# Patient Record
Sex: Female | Born: 1960 | Race: Black or African American | Hispanic: No | Smoking: Former smoker
Health system: Southern US, Community
[De-identification: ages and names within clinical notes are randomized; demographics above are authoritative.]

## PROBLEM LIST (undated history)

## (undated) ENCOUNTER — Inpatient Hospital Stay: Payer: Self-pay | Admitting: Physician Assistant

## (undated) ENCOUNTER — Emergency Department (HOSPITAL_COMMUNITY): Admission: EM | Disposition: A | Discharge status: Home / Self Care | Payer: Medicaid Other

## (undated) DIAGNOSIS — C50919 Malignant neoplasm of unspecified site of unspecified female breast: Secondary | ICD-10-CM

## (undated) DIAGNOSIS — R351 Nocturia: Secondary | ICD-10-CM

## (undated) DIAGNOSIS — R569 Unspecified convulsions: Secondary | ICD-10-CM

## (undated) DIAGNOSIS — F329 Major depressive disorder, single episode, unspecified: Secondary | ICD-10-CM

## (undated) DIAGNOSIS — F4310 Post-traumatic stress disorder, unspecified: Secondary | ICD-10-CM

## (undated) DIAGNOSIS — IMO0001 Reserved for inherently not codable concepts without codable children: Secondary | ICD-10-CM

## (undated) DIAGNOSIS — F319 Bipolar disorder, unspecified: Secondary | ICD-10-CM

## (undated) DIAGNOSIS — M1990 Unspecified osteoarthritis, unspecified site: Secondary | ICD-10-CM

## (undated) DIAGNOSIS — F419 Anxiety disorder, unspecified: Secondary | ICD-10-CM

## (undated) DIAGNOSIS — F209 Schizophrenia, unspecified: Secondary | ICD-10-CM

## (undated) DIAGNOSIS — R443 Hallucinations, unspecified: Secondary | ICD-10-CM

## (undated) DIAGNOSIS — D649 Anemia, unspecified: Secondary | ICD-10-CM

## (undated) DIAGNOSIS — E039 Hypothyroidism, unspecified: Secondary | ICD-10-CM

## (undated) DIAGNOSIS — F1410 Cocaine abuse, uncomplicated: Secondary | ICD-10-CM

## (undated) DIAGNOSIS — G4730 Sleep apnea, unspecified: Secondary | ICD-10-CM

## (undated) DIAGNOSIS — F32A Depression, unspecified: Secondary | ICD-10-CM

## (undated) DIAGNOSIS — C50911 Malignant neoplasm of unspecified site of right female breast: Secondary | ICD-10-CM

## (undated) DIAGNOSIS — I10 Essential (primary) hypertension: Secondary | ICD-10-CM

## (undated) DIAGNOSIS — J449 Chronic obstructive pulmonary disease, unspecified: Secondary | ICD-10-CM

## (undated) HISTORY — PX: INGUINAL HERNIA REPAIR: SUR1180

## (undated) HISTORY — DX: Malignant neoplasm of unspecified site of unspecified female breast: C50.919

## (undated) HISTORY — PX: TONSILLECTOMY: SUR1361

## (undated) HISTORY — PX: FRACTURE SURGERY: SHX138

## (undated) HISTORY — PX: OVARIAN CYST SURGERY: SHX726

## (undated) HISTORY — DX: Unspecified convulsions: R56.9

---

## 1991-10-08 HISTORY — PX: PATELLA FRACTURE SURGERY: SHX735

## 1999-07-05 ENCOUNTER — Encounter: Payer: Self-pay | Admitting: Obstetrics & Gynecology

## 1999-07-05 ENCOUNTER — Inpatient Hospital Stay (HOSPITAL_COMMUNITY): Admission: AD | Admit: 1999-07-05 | Discharge: 1999-07-18 | Payer: Self-pay | Admitting: Obstetrics & Gynecology

## 1999-07-10 ENCOUNTER — Encounter: Payer: Self-pay | Admitting: Obstetrics & Gynecology

## 1999-07-11 ENCOUNTER — Encounter: Payer: Self-pay | Admitting: Obstetrics & Gynecology

## 2000-05-01 ENCOUNTER — Encounter: Payer: Self-pay | Admitting: Specialist

## 2000-05-01 ENCOUNTER — Inpatient Hospital Stay (HOSPITAL_COMMUNITY): Admission: EM | Admit: 2000-05-01 | Discharge: 2000-05-12 | Payer: Self-pay | Admitting: Emergency Medicine

## 2000-05-01 ENCOUNTER — Encounter: Payer: Self-pay | Admitting: Emergency Medicine

## 2000-05-04 ENCOUNTER — Encounter: Payer: Self-pay | Admitting: Specialist

## 2000-05-06 ENCOUNTER — Encounter: Payer: Self-pay | Admitting: Specialist

## 2000-05-15 ENCOUNTER — Emergency Department (HOSPITAL_COMMUNITY): Admission: EM | Admit: 2000-05-15 | Discharge: 2000-05-15 | Payer: Self-pay | Admitting: Emergency Medicine

## 2000-08-09 ENCOUNTER — Emergency Department (HOSPITAL_COMMUNITY): Admission: EM | Admit: 2000-08-09 | Discharge: 2000-08-10 | Payer: Self-pay | Admitting: Emergency Medicine

## 2000-08-09 ENCOUNTER — Encounter: Payer: Self-pay | Admitting: Emergency Medicine

## 2000-10-28 ENCOUNTER — Emergency Department (HOSPITAL_COMMUNITY): Admission: EM | Admit: 2000-10-28 | Discharge: 2000-10-28 | Payer: Self-pay | Admitting: *Deleted

## 2004-01-25 ENCOUNTER — Emergency Department (HOSPITAL_COMMUNITY): Admission: EM | Admit: 2004-01-25 | Discharge: 2004-01-25 | Payer: Self-pay | Admitting: Emergency Medicine

## 2004-07-11 ENCOUNTER — Ambulatory Visit: Payer: Self-pay | Admitting: Family Medicine

## 2004-07-11 ENCOUNTER — Inpatient Hospital Stay (HOSPITAL_COMMUNITY): Admission: AD | Admit: 2004-07-11 | Discharge: 2004-07-18 | Payer: Self-pay | Admitting: *Deleted

## 2006-11-25 ENCOUNTER — Emergency Department (HOSPITAL_COMMUNITY): Admission: EM | Admit: 2006-11-25 | Discharge: 2006-11-25 | Payer: Self-pay | Admitting: Emergency Medicine

## 2007-12-02 DIAGNOSIS — G40909 Epilepsy, unspecified, not intractable, without status epilepticus: Secondary | ICD-10-CM | POA: Insufficient documentation

## 2008-05-26 ENCOUNTER — Emergency Department (HOSPITAL_COMMUNITY): Admission: EM | Admit: 2008-05-26 | Discharge: 2008-05-26 | Payer: Self-pay | Admitting: Emergency Medicine

## 2010-10-28 ENCOUNTER — Encounter: Payer: Self-pay | Admitting: Obstetrics
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
## 2011-02-22 NOTE — Consult Note (Signed)
Courtney Terry, Courtney Terry              ACCOUNT NO.:  [id number]   MEDICAL RECORD NO.:  [id number]          PATIENT TYPE:  INP   LOCATION:  9307                          FACILITY:  [facility]   PHYSICIAN:  Theone Stanley, MD   DATE OF BIRTH:  [birth year]   DATE OF CONSULTATION:  DATE OF DISCHARGE:                                   CONSULTATION   REASON FOR CONSULTATION:  Management of her seizure medications and follow  up.   HISTORY:  This is a 50 year old African-American female who states that she  has had seizures since the age of 50 and has been on Dilantin since then.  She was taken off her Dilantin at 1 point in time, but had to be restarted  because she started having seizures again. She was followed by a Dr. Yetta Barre,  on the outside; however, he apparently passed away and she ran out of her  medications in the past week.  She states that she had 1 seizure yesterday;  and, when I asked her how she knew, she stated that she wakes up and notices  that she had had urinary incontinence.   PAST MEDICAL HISTORY:  Depression, seizures, and a history of PID.   MEDICATIONS:  1.  Lexapro 20 mg p.o. daily.  2.  Seroquel 100 mg 1 p.o. daily q.h.s.  3.  Dilantin 100 mg 1 p.o. t.i.d.   SURGERIES:  Some right knee cap surgery.   ALLERGIES:  No known drug allergies.   FAMILY HISTORY:  The patient has a sister with hypertension, diabetes.  Father passed away of natural causes.  Mother passed away at age 65 from an  MI.   SOCIAL HISTORY:  The patient is widowed, lives in [place], has no  children.  Denies any alcohol, smokes 1 pack per day.  She smokes crack  cocaine approximately 1 time a week.   REVIEW OF SYSTEMS:  NEUROLOGIC:  Negative.  No headache, no lightheadedness,  no dizziness, no diplopia.  CARDIOVASCULAR:  No chest pain, PND.  RESPIRATORY:  She denies any shortness of breath or cough.  GI:  The patient  states that she has had liquid-like diarrhea for the past week about 3-4  stools a day.  It is brownish in color.  No melena or hematochezia.  She has  overall generalized weakness.  No asymmetric weakness, no trembling.   PHYSICAL EXAMINATION:  VITAL SIGNS:  Blood pressure 114/77, temperature of  98.8, pulse of 109, respiratory rate of 20, saturating 99% on room air.  HEENT:  Head atraumatic normocephalic.  Eyes pupils equal and react to  light.  Extraocular movements intact. Ears have no discharge.  Throat clear.  No exudate.  Mucosa moist.  NECK:  Supple.  No lymphadenopathy.  No JVD.  HEART:  Regular rate and rhythm, no murmurs, rubs, or gallops heard.  LUNGS:  Clear to auscultation bilaterally.  ABDOMEN:  Diffusely tender with exquisite tenderness in the lower abdomen.  EXTREMITIES:  No edema, cyanosis or clubbing.  NEUROLOGIC:  The patient is alert and oriented x3.   LABS:  Sodium 139, potassium  of 2.6, chloride of 100, BUN at 9, glucose at  98, creatinine at 0.8.  Hemoglobin at 15, hematocrit at 44, white count 25,  platelets 357,000, differential neutrophils at 93%.  UA:  Nitrate was  negative. She had a moderate amount of leukocyte esterase, many bacteria;  however, the urine showed many epithelials.  A wet prep was performed which  showed clue cells as many, wbc's moderate amount.  The patient was positive  for cocaine on her toxicology screen.   ASSESSMENT AND PLAN:  Patient is a 50 year old African-American female with  a history of seizure disorder.  1.  Epilepsy.  Patient has been off her medications for a week and had a      seizure yesterday.  Will load her in the emergency room and then      continue with her Dilantin 100 mg t.i.d.  2.  Diarrhea.  Exact etiology is uncertain at this time.  She denies any      recent antibiotic use so we will check stool cultures.  3.  Depression. Continue on her Lexapro and Seroquel.  4.  Social issues.  The patient uses crack cocaine and this is an issue.  We      will get social work involved in her care  to see if we can set up her      for some treatment; and, in regards to followup, we will get case      management involved so that we can see if we can set her up with health      serve just to make sure that she has follow up.       AEJ/MEDQ  D:  07/11/2004  T:  07/11/2004  Job:  [id number]

## 2011-02-22 NOTE — Discharge Summary (Signed)
Courtney Terry, Courtney Terry              ACCOUNT NO.:  [id number]   MEDICAL RECORD NO.:  [id number]          PATIENT TYPE:  INP   LOCATION:  9307                          FACILITY[facility]  [facility]   PHYSICIAN:  Phil D. Okey Dupre, M.D.     DATE OF BIRTH:  [birth year]   DATE OF ADMISSION:  07/11/2004  DATE OF DISCHARGE:                                 DISCHARGE SUMMARY   DISCHARGE DIAGNOSES:  38.  A 49 year old G0 with a left tuboovarian abscess and bilateral      pyosalpinges consistent with pelvic inflammatory disease.  2.  History of seizure disorder.  3.  History of depression.  4.  History of substance abuse with a positive drug screen for cocaine.   DISCHARGE MEDICATIONS:  1.  Doxycycline 100 mg p.o. b.i.d. x14 days.  2.  Flagyl 500 mg p.o. b.i.d. x14 days.  3.  Lexapro 10 mg daily.  4.  Dilantin 100 mg t.i.d.   ADMISSION HISTORY:  [name] was admitted by the GYN service on call for  abdominal pain, vomiting, and fever.  She was noted to have a left  pyosalpinx on ultrasound.   #1 - PELVIC INFLAMMATORY DISEASE.  The patient remained afebrile throughout  her hospitalization.  She was initially started on p.o. doxycycline and  cefotetan IV.  The IV antibiotics were discontinued and restarted after the  patient had worsening abdominal pain.  On hospital day #5, she was thought  to have some rebound and guarding on abdominal exam and an abdominal and  pelvic CT was ordered to ensure that she did not have peritoneal signs.  Her  appendix looked normal.  She was noted to have a left tuboovarian abscess  and bilateral pyosalpinges.  This was consistent with her pelvic  inflammatory disease.  Of note, she did have a negative gonorrhea and  chlamydia.  She had a negative RPR.  Negative HIV.  On day of discharge the  patient was still afebrile.  Her abdominal exam was much improved.  She is  going to be discharged on oral doxycycline and Flagyl.   #2 - BACTERIAL VAGINOSIS.  Of note, she did have BV on  a wet prep and was  appropriately treated with Flagyl in the hospital.   As far as her seizures and depression, these were stable throughout her  hospitalization.  She is going to followed up with Dr. Reche Dixon as an  outpatient.   CONDITION ON DISCHARGE:  The patient was discharged to home in stable  condition.   INSTRUCTIONS GIVEN TO PATIENT:  The patient was told of the above medical  regimen.  She is to call Dr. Benjaman Lobe office for an appointment and she will  be given an appointment with GYN clinic before she leaves today.      LC/MEDQ  D:  07/18/2004  T:  07/18/2004  Job:  [id number]   cc:   Dineen Kid. Reche Dixon, M.D.  [address] [place]  [place] [address]  Fax: [phone]

## 2011-12-12 ENCOUNTER — Other Ambulatory Visit: Payer: Self-pay | Admitting: Family Medicine

## 2011-12-12 DIAGNOSIS — N63 Unspecified lump in unspecified breast: Secondary | ICD-10-CM

## 2011-12-19 ENCOUNTER — Ambulatory Visit
Admission: RE | Admit: 2011-12-19 | Discharge: 2011-12-19 | Disposition: A | Discharge status: Home / Self Care | Payer: Medicaid Other | Source: Ambulatory Visit | Attending: Family Medicine | Admitting: Family Medicine

## 2011-12-19 DIAGNOSIS — N63 Unspecified lump in unspecified breast: Secondary | ICD-10-CM

## 2011-12-24 ENCOUNTER — Other Ambulatory Visit: Payer: Self-pay | Admitting: Family Medicine

## 2011-12-24 DIAGNOSIS — N6310 Unspecified lump in the right breast, unspecified quadrant: Secondary | ICD-10-CM

## 2011-12-30 ENCOUNTER — Ambulatory Visit
Admission: RE | Admit: 2011-12-30 | Discharge: 2011-12-30 | Disposition: A | Discharge status: Home / Self Care | Source: Ambulatory Visit | Attending: Family Medicine | Admitting: Family Medicine

## 2011-12-30 ENCOUNTER — Other Ambulatory Visit: Payer: Self-pay | Admitting: Family Medicine

## 2011-12-30 DIAGNOSIS — N6310 Unspecified lump in the right breast, unspecified quadrant: Secondary | ICD-10-CM

## 2012-01-06 DIAGNOSIS — C50919 Malignant neoplasm of unspecified site of unspecified female breast: Secondary | ICD-10-CM

## 2012-01-06 HISTORY — DX: Malignant neoplasm of unspecified site of unspecified female breast: C50.919

## 2012-01-20 ENCOUNTER — Telehealth: Payer: Self-pay | Admitting: *Deleted

## 2012-01-20 ENCOUNTER — Ambulatory Visit (INDEPENDENT_AMBULATORY_CARE_PROVIDER_SITE_OTHER): Admitting: Surgery

## 2012-01-20 ENCOUNTER — Encounter (INDEPENDENT_AMBULATORY_CARE_PROVIDER_SITE_OTHER): Payer: Self-pay | Admitting: Surgery

## 2012-01-20 VITALS — BP 120/85 | HR 87 | Temp 97.0°F | Ht 70.0 in | Wt 181.2 lb

## 2012-01-20 DIAGNOSIS — C50919 Malignant neoplasm of unspecified site of unspecified female breast: Secondary | ICD-10-CM

## 2012-01-20 NOTE — Patient Instructions (Signed)
Breast Cancer, What You Should Know Breast cancer is one of the most common types of cancer in women. It is the second leading cause of death for all women.  The probability that breast cancer will return after treatment is directly related to the stage of its malignancy. This means how advanced the cancer is before it is found and treated. If breast cancer is found and treated early, before the cancer has spread to the lymph nodes, your chance for survival is much better. Delays in diagnosing or treating breast cancer can result in spread of the cancer. This happens when warning signs are ignored and proper measures for diagnosis or treatment are not taken. When breast cancer spreads beyond the breast to other parts of the body, staging is done in order to find out the extent of the cancer and to give the best treatment available. Today, there is a better chance of survival in the treatment of breast cancer. In the past 20 to 30 years, the diagnosis and treatment of breast cancer has greatly improved. There are more options for the type of surgery available (not just radical breast removal [mastectomy] and removal of lymph nodes in the armpit). There are also improved medicines, chemotherapy, radiation therapy, and breast reconstruction. These innovations have improved the survival rate and have helped with complications of breast cancer that were present in the past. Radiation, chemotherapy, and medicines may be used before or after surgery to shrink the tumor, kill any remaining cancer cells, and to prevent spreading and recurrence of the cancer. Research to develop new medicines, procedures, and combinations of treatments is constantly being done to help improve prevention, treatment, and to reduce recurrence of breast cancer. TYPES OF BREAST CANCER  In situ. Cancer is contained in the tumor and has not spread.   Invasive. Cancer has spread outside the tumor.   Inflammatory. The whole breast is red  (inflamed), painful, and swollen (rare).   Paget's disease. Cancer starts in the nipple and spreads to the areola (rare).   Breast cancer in the milk ducts.   Breast cancer in the milk lobules.  Males can get breast cancer, but it is very rare. RISK FACTORS There are certain conditions and circumstances that place some women at risk for developing breast cancer. If you have any or several of these risks, you should be aware of them and take extra precautions to take better care of yourself. Some of these risk factors include:  Previous history of breast cancer.   Family history of breast cancer.   Abnormal genes present in your body (BRCA 1, BRCA 2, and HER-2).   Calcium deposits (calcifications) seen on your breast X-ray (mammogram).   Starting your menstrual periods before age 19 (early menarche).   Late menopause, at age 5 or older.   Heavy radiation exposure to the chest.   Cancer of the uterus, ovary, or intestine.   Drinking too much alcohol.   Never having a baby or breastfeeding.   Taking too much hormone treatment for too long.   Being very tall.   Being of Jewish descent.   Obesity.   Presence of estrogen or progesterone receptor cells in the breast.   Smoking.  Low-dose birth control pills and fibrocystic disease of the breast are not thought to cause breast cancer. MANAGING BREAST PROBLEMS The following are steps that should be taken to avoid a bad outcome with a breast problem:  You should practice "breast self-awareness." This means understanding the normal  appearance and feel of your breasts and may include breast self-exams. Any changes detected, no matter how small, should be reported to your caregiver. Women in their 62s and 30s should have a clinical breast exam (CBE) by a caregiver as part of a regular health exam every 1 to 3 years. After age 56, women should have a CBE every year.   All lumps should be evaluated thoroughly by ultrasound,  mammogram, magnetic resonance imaging (MRI) scan, tissue sample (biopsy) exam, or removed and examined for cancer.   If you are told you have a breast infection, make sure you follow up with your caregiver until it gets better and goes away.   If you are told a tumor is benign (noncancerous), question the diagnosis and ask for a biopsy to make sure.   All tumors should be removed and examined for cancer.   Do not disregard sharp pains in your breast. Make sure you have an answer for what is causing the pain and follow up properly.   If you have signs of pulling in (retraction) of your nipple, make sure you understand the reason for this and that proper studies are done.   If you have signs of discharge from your nipple, especially blood, make sure you are told the reason for this and that proper tests are done.   If a needle aspiration biopsy is done and is negative, make sure you know when to follow up with this, and when repeat testing should be done. Get a second opinion if necessary.   Do not rely only on mammograms. Be sure to have regular self-exams and physical breast exams by your caregiver.   Have a mammogram done on a regular basis, or as recommended. The American Cancer Society has guidelines based on age and risk factors that may be present.   If there are findings from your mammogram, make sure you follow up properly. Make sure you know when additional testing needs to be done.  You should know that:  Radiation from mammograms does not cause cancer or other problems to the breast, skin, heart, or lungs.   Other screening tests for the breast are ultrasound and MRI scans.   There are medicines available that may help prevent breast cancer or help prevent recurrence in women at high risk, 52 years old or older. These medicines block estrogen hormone from getting into the tumor:   Tamoxifen.   Raloxifene.   Trastuzumab.   Sometimes, the ovaries will be removed to decrease  estrogen or progesterone hormone production. These hormones may stimulate the growth, recurrence, or spread of breast cancer.  Finding out the results of your test When you have breast testing done, ask when your test results will be ready. Make sure you get your test results. FOR MORE INFORMATION American Cancer Society: www.cancer.org Document Released: 01/01/2006 Document Revised: 09/12/2011 Document Reviewed: 08/10/2009 [facility] Patient Information 2012 [place], [place].  Lumpectomy, Breast Conserving Surgery A lumpectomy is breast surgery that removes only part of the breast. Another name used may be partial mastectomy. The amount removed varies. Make sure you understand how much of your breast will be removed. Reasons for a lumpectomy:  Any solid breast mass.   Grouped significant nodularity that may be confused with a solitary breast mass.  Lumpectomy is the most common form of breast cancer surgery today. The surgeon removes the portion of your breast which contains the tumor (cancer). This is the lump. Some normal tissue around the lump is also  removed to be sure that all the tumor has been removed.  If cancer cells are found in the margins where the breast tissue was removed, your surgeon will do more surgery to remove the remaining cancer tissue. This is called re-excision surgery. Radiation and/or chemotherapy treatments are often given following a lumpectomy to kill any cancer cells that could possibly remain.  REASONS YOU MAY NOT BE ABLE TO HAVE BREAST CONSERVING SURGERY:  The tumor is located in more than one place.   Your breast is small and the tumor is large so the breast would be disfigured.   The entire tumor removal is not successful with a lumpectomy.   You cannot commit to a full course of chemotherapy, radiation therapy or are pregnant and cannot have radiation.   You have previously had radiation to the breast to treat cancer.  HOW A LUMPECTOMY IS PERFORMED If  overnight nursing is not required following a biopsy, a lumpectomy can be performed as a same-day surgery. This can be done in a hospital, clinic, or surgical center. The anesthesia used will depend on your surgeon. They will discuss this with you. A general anesthetic keeps you sleeping through the procedure. LET YOUR CAREGIVERS KNOW ABOUT THE FOLLOWING:  Allergies   Medications taken including herbs, eye drops, over the counter medications, and creams.   Use of steroids (by mouth or creams)   Previous problems with anesthetics or Novocaine.   Possibility of pregnancy, if this applies   History of blood clots (thrombophlebitis)   History of bleeding or blood problems.   Previous surgery   Other health problems  BEFORE THE PROCEDURE You should be present one hour prior to your procedure unless directed otherwise.  AFTER THE PROCEDURE  After surgery, you will be taken to the recovery area where a nurse will watch and check your progress. Once you're awake, stable, and taking fluids well, barring other problems you will be allowed to go home.   Ice packs applied to your operative site may help with discomfort and keep the swelling down.   A small rubber drain may be placed in the breast for a couple of days to prevent a hematoma from developing in the breast.   A pressure dressing may be applied for 24 to 48 hours to prevent bleeding.   Keep the wound dry.   You may resume a normal diet and activities as directed. Avoid strenuous activities affecting the arm on the side of the biopsy site such as tennis, swimming, heavy lifting (more than 10 pounds) or pulling.   Bruising in the breast is normal following this procedure.   Wearing a bra - even to bed - may be more comfortable and also help keep the dressing on.   Change dressings as directed.   Only take over-the-counter or prescription medicines for pain, discomfort, or fever as directed by your caregiver.  Call for your  results as instructed by your surgeon. Remember it is your responsibility to get the results of your lumpectomy if your surgeon asked you to follow-up. Do not assume everything is fine if you have not heard from your caregiver. SEEK MEDICAL CARE IF:   There is increased bleeding (more than a small spot) from the wound.   You notice redness, swelling, or increasing pain in the wound.   Pus is coming from wound.   An unexplained oral temperature above 102 F (38.9 C) develops.   You notice a foul smell coming from the wound or  dressing.  SEEK IMMEDIATE MEDICAL CARE IF:   You develop a rash.   You have difficulty breathing.   You have any allergic problems.  Document Released: 11/04/2006 Document Revised: 09/12/2011 Document Reviewed: 02/05/2007 [facility] Patient Information 2012 [place], [place].

## 2012-01-20 NOTE — Telephone Encounter (Signed)
Spoke with Courtney Terry.  Pt is incarcerated and needs authorization to come for these visits.  She will contact me once she has them and then I will get her scheduled and coordinate with Rad Onc.  Called Marcelino Duster to make her aware.

## 2012-01-20 NOTE — Progress Notes (Signed)
Patient ID: Courtney Terry, female   DOB: 09-May-1961, 51 y.o.   MRN: [id number]  Chief Complaint  Patient presents with  . Pre-op Exam    eval Rt br ca    HPI Courtney Terry is a 51 y.o. female. Pt sent ata the request of Dr Judyann Munson due to right breast cancer.  Pt is incarcerated for the last 7 months.  She noticed a mass in her right breast for 7 months.  It is sore and getting larger.  No nipple discharge.  Left breast normal.  No family history of breast cancer.  Brother dying of cancer in jail. HPI  Past Medical History  Diagnosis Date  . Thyroid disease     Past Surgical History  Procedure Date  . Knee surgery     right    Family History  Problem Relation Age of Onset  . Heart disease Mother     Social History History  Substance Use Topics  . Smoking status: Former Smoker    Types: Cigarettes  . Smokeless tobacco: Not on file  . Alcohol Use: No    No Known Allergies  Current Outpatient Prescriptions  Medication Sig Dispense Refill  . divalproex (DEPAKOTE) 500 MG DR tablet Take 500 mg by mouth 2 (two) times daily.      [name] Kitchen levothyroxine (SYNTHROID, LEVOTHROID) 75 MCG tablet Take 75 mcg by mouth daily.        Review of Systems Review of Systems  Constitutional: Negative for fever, chills and unexpected weight change.  HENT: Negative for hearing loss, congestion, sore throat, trouble swallowing and voice change.   Eyes: Negative for visual disturbance.  Respiratory: Negative for cough and wheezing.   Cardiovascular: Negative for chest pain, palpitations and leg swelling.  Gastrointestinal: Negative for nausea, vomiting, abdominal pain, diarrhea, constipation, blood in stool, abdominal distention and anal bleeding.  Genitourinary: Negative for hematuria, vaginal bleeding and difficulty urinating.  Musculoskeletal: Negative for arthralgias.  Skin: Negative for rash and wound.  Neurological: Negative for seizures, syncope and headaches.  Hematological: Negative for  adenopathy. Does not bruise/bleed easily.  Psychiatric/Behavioral: Negative for confusion.    Blood pressure 120/85, pulse 87, temperature 97 F (36.1 C), temperature source Temporal, height 5\' 10"  (1.778 m), weight 181 lb 3.2 oz (82.192 kg), SpO2 94.00%.  Physical Exam Physical Exam  Constitutional: She is oriented to person, place, and time. She appears well-developed and well-nourished.  HENT:  Head: Normocephalic and atraumatic.  Eyes: EOM are normal. Pupils are equal, round, and reactive to light.  Neck: Normal range of motion. Neck supple.  Cardiovascular: Normal rate and regular rhythm.   Pulmonary/Chest: Effort normal and breath sounds normal.       Right breast mass upper outer quadrant mobile about 2 cm.  No axillary adenopathy.  Left breast normal.  No supraclavicular adenopathy.  Abdominal: Soft. Bowel sounds are normal.  Musculoskeletal: Normal range of motion.  Neurological: She is alert and oriented to person, place, and time.  Skin: Skin is warm and dry.  Psychiatric: She has a normal mood and affect. Her behavior is normal. Judgment and thought content normal.    Data Reviewed U/S RIGHT BREAST 1.1 CM MASS SEEN ON MAMMOGRAM AS WELL. Path   Lobular carcinoma er / pr positive her 2 neu negative 10 % KI 67  Assessment    STAGE 2 LOBULAR CARCINOMA RIGHT BREAST    Plan    Patient will be referred for medical oncology and radiation oncology opinions. She certainly could  get new adjuvant chemotherapy up front to shrink this down prior to surgery but given its present size lumpectomy is possible without neoadjuvant therapy. She will like to conserve her breast. Lymph node biopsy was negative. We'll schedule surgery after she is seen medical and radiation oncology. I discussed lumpectomy with sentinel lymph node mapping. Risk, benefits, and alternative was discussed  The procedure has been discussed with the patient. Alternatives to surgery have been discussed with the  patient.  Risks of surgery include bleeding,  Infection,  Seroma formation, death,  and the need for further surgery.   The patient understands and wishes to proceed.Sentinel lymph node mapping and dissection has been discussed with the patient.  Risk of bleeding,  Infection,  Seroma formation,  Additional procedures,,  Shoulder weakness ,  Shoulder stiffness,  Nerve and blood vessel injury and reaction to the mapping dyes have been discussed.  Alternatives to surgery have been discussed with the patient.  The patient agrees to proceed.    Courtney Terry A. 01/20/2012, 9:23 AM

## 2012-01-30 ENCOUNTER — Telehealth: Payer: Self-pay | Admitting: Oncology

## 2012-01-30 NOTE — Telephone Encounter (Signed)
I called Kendal Hymen at [employer] [facility] to inquire if this patient will be allowed for a consult with Medical and Radiation Oncology.

## 2012-01-30 NOTE — Telephone Encounter (Signed)
The patient returned the call after the jail contacted her sister.   The patient will see Dr. Welton Flakes on Monday at 3:30 to arrive at 3:00 for [facility] and labs.   She has medicaid but is waiting for her new card- I told her that our financial counselors will help her with that.   Directions and Information given to the patient.

## 2012-01-30 NOTE — Telephone Encounter (Signed)
This patient is no longer in jail.   The only contact information- demographics is the jail.   The staff will investigate and see if they can locate any other contact info and call me back if they can.   I will contact Dr. Luisa Hart.

## 2012-01-31 ENCOUNTER — Other Ambulatory Visit: Payer: Self-pay | Admitting: Oncology

## 2012-01-31 DIAGNOSIS — C50419 Malignant neoplasm of upper-outer quadrant of unspecified female breast: Secondary | ICD-10-CM

## 2012-02-03 ENCOUNTER — Other Ambulatory Visit: Admitting: Lab

## 2012-02-03 ENCOUNTER — Ambulatory Visit

## 2012-02-03 ENCOUNTER — Encounter: Payer: Self-pay | Admitting: Oncology

## 2012-02-03 ENCOUNTER — Ambulatory Visit (HOSPITAL_BASED_OUTPATIENT_CLINIC_OR_DEPARTMENT_OTHER): Payer: Medicaid Other | Admitting: Oncology

## 2012-02-03 VITALS — BP 151/93 | HR 98 | Temp 98.2°F | Ht 70.0 in | Wt 173.9 lb

## 2012-02-03 DIAGNOSIS — C50419 Malignant neoplasm of upper-outer quadrant of unspecified female breast: Secondary | ICD-10-CM

## 2012-02-03 DIAGNOSIS — Z170 Estrogen receptor positive status [ER+]: Secondary | ICD-10-CM

## 2012-02-03 LAB — CBC WITH DIFFERENTIAL/PLATELET
BASO%: 0.6 % (ref 0.0–2.0)
Basophils Absolute: 0.1 10*3/uL (ref 0.0–0.1)
EOS%: 3.1 % (ref 0.0–7.0)
Eosinophils Absolute: 0.3 10*3/uL (ref 0.0–0.5)
HCT: 42.5 % (ref 34.8–46.6)
HGB: 13.5 g/dL (ref 11.6–15.9)
LYMPH%: 26.7 % (ref 14.0–49.7)
MCH: 28.3 pg (ref 25.1–34.0)
MCHC: 31.8 g/dL (ref 31.5–36.0)
MCV: 88.9 fL (ref 79.5–101.0)
MONO#: 0.4 10*3/uL (ref 0.1–0.9)
MONO%: 4.7 % (ref 0.0–14.0)
NEUT#: 5.9 10*3/uL (ref 1.5–6.5)
NEUT%: 64.9 % (ref 38.4–76.8)
Platelets: 244 10*3/uL (ref 145–400)
RBC: 4.78 10*6/uL (ref 3.70–5.45)
RDW: 14.9 % — ABNORMAL HIGH (ref 11.2–14.5)
WBC: 9.1 10*3/uL (ref 3.9–10.3)
lymph#: 2.4 10*3/uL (ref 0.9–3.3)
nRBC: 0 % (ref 0–0)

## 2012-02-03 LAB — COMPREHENSIVE METABOLIC PANEL
ALT: 11 U/L (ref 0–35)
AST: 18 U/L (ref 0–37)
Albumin: 3.8 g/dL (ref 3.5–5.2)
Alkaline Phosphatase: 103 U/L (ref 39–117)
BUN: 17 mg/dL (ref 6–23)
CO2: 30 mEq/L (ref 19–32)
Calcium: 9.5 mg/dL (ref 8.4–10.5)
Chloride: 105 mEq/L (ref 96–112)
Creatinine, Ser: 0.7 mg/dL (ref 0.50–1.10)
Glucose, Bld: 101 mg/dL — ABNORMAL HIGH (ref 70–99)
Potassium: 4.2 mEq/L (ref 3.5–5.3)
Sodium: 141 mEq/L (ref 135–145)
Total Bilirubin: 0.2 mg/dL — ABNORMAL LOW (ref 0.3–1.2)
Total Protein: 8.4 g/dL — ABNORMAL HIGH (ref 6.0–8.3)

## 2012-02-03 LAB — CANCER ANTIGEN 27.29: CA 27.29: 21 U/mL (ref 0–39)

## 2012-02-03 NOTE — Progress Notes (Signed)
Per patient's request, letter sent to [facility] at fax # [phone].  Copy sent to medical records.

## 2012-02-03 NOTE — Patient Instructions (Signed)
1. Proceed with surgery first  2. I will see you back in 6-7 weeks

## 2012-02-03 NOTE — Progress Notes (Signed)
Patient came in today as a new patient she has medicaid,I had to help her fill out her [employer] forms and the preliminary financial assessment form.I told her that we offer financial assistance here with medications and chemo drugs.I don't know if she understood what I was saying,but I told her if she need anything she could call me.

## 2012-02-03 NOTE — Progress Notes (Signed)
Courtney Terry [id number] 1961-07-10 50 y.o. 02/03/2012 4:20 PM  CC Dr. Maisie Fus Cornett  REASON FOR CONSULTATION:   51 year old with new right breast cancer   STAGE:  T1CNxMx  REFERRING PHYSICIAN: Dr. Luisa Hart  HISTORY OF PRESENT ILLNESS:  Courtney Terry is a 51 y.o. female.  With medical history significant for chronic seizures chronic kidney disease and hypothyroidism. About 7-8 months ago patient felt a mass in her right breast. However she was incarcerated at the time. Patient recently had a mammogram performed that showed a 1.3 cm spiculated mass. She went on to have an ultrasound of the breast performed that showed an 11 mm mass. She had a biopsy done that showed a invasive lobular carcinoma ER positive PR positive HER-2/neu negative with a proliferation marker 10%. She has been seen by Dr. Harriette Bouillon who has recommended surgery. Patient is very much interested in conserving her breast. So she would certainly would be a candidate for a lumpectomy. She otherwise has no fevers chills night sweats headaches shortness of breath chest pains palpitations no myalgias or arthralgias. Her major of the 14 point review of systems is negative.   Past Medical History: Past Medical History  Diagnosis Date  . Thyroid disease   . Breast cancer 01/2012  . Seizures     epilepsy  . Chronic kidney disease     Past Surgical History: Past Surgical History  Procedure Date  . Knee surgery     right   Family History: Father with cancer  Family History  Problem Relation Age of Onset  . Heart disease Mother   . Cancer Sister     cervical cancer  . Cancer Brother     colon  . Breast cancer Sister     Social History: widow, 1 daughter who is 3 - 4 months History  Substance Use Topics  . Smoking status: Former Smoker    Types: Cigarettes  . Smokeless tobacco: Not on file  . Alcohol Use: No    Allergies: No Known Allergies  Current Medications: Current Outpatient Prescriptions    Medication Sig Dispense Refill  . divalproex (DEPAKOTE) 500 MG DR tablet Take 500 mg by mouth 2 (two) times daily.      [name] Kitchen levothyroxine (SYNTHROID, LEVOTHROID) 75 MCG tablet Take 75 mcg by mouth daily.        OB/GYN History: Menarche at 18, premenopause, G61P1O1  Fertility Discussion:N/A Prior History of Cancer:N/A  Health Maintenance:  Colonoscopy N/A Bone Density none Last PAP smear many years  ECOG PERFORMANCE STATUS: 0 - Asymptomatic  Genetic Counseling/testing: needs referral to genetic counseling and testing based on family history and personal history  REVIEW OF SYSTEMS:  Pertinent items are noted in HPI.  PHYSICAL EXAMINATION: Blood pressure 151/93, pulse 98, temperature 98.2 F (36.8 C), temperature source Oral, height 5\' 10"  (1.778 m), weight 173 lb 14.4 oz (78.881 kg).  WGN:FAOZH, healthy, no distress, well nourished and well developed SKIN: skin color, texture, turgor are normal HEAD: Normocephalic EYES: PERRLA, EOMI, Conjunctiva are pink and non-injected, sclera clear EARS: External ears normal OROPHARYNX:no exudate, no erythema and lips, buccal mucosa, and tongue normal  NECK: supple, no adenopathy LYMPH:  no palpable lymphadenopathy, no hepatosplenomegaly BREAST:Left breast is without any masses or nipple discharge right breast reveals a palpable 1 cm to 2 cm area. No nipple discharge inversion or retraction no overlying skin changes LUNGS: clear to auscultation and percussion HEART: regular rate & rhythm, no murmurs and no gallops ABDOMEN:abdomen soft, non-tender,  normal bowel sounds and no masses or organomegaly BACK: Back symmetric, no curvature. EXTREMITIES:no edema, no clubbing, no cyanosis  NEURO: alert & oriented x 3 with fluent speech, no focal motor/sensory deficits, gait normal, reflexes normal and symmetric  STUDIES/RESULTS: No results found.   LABS:    Chemistry   No results found for this basename: NA, K, CL, CO2, BUN, CREATININE, GLU    No results found for this basename: CALCIUM, ALKPHOS, AST, ALT, BILITOT      No results found for this basename: WBC, HGB, HCT, MCV, PLT       PATHOLOGY: ADDITIONAL INFORMATION: 2. PROGNOSTIC INDICATORS - ACIS Results IMMUNOHISTOCHEMICAL AND MORPHOMETRIC ANALYSIS BY THE AUTOMATED CELLULAR IMAGING SYSTEM (ACIS) Estrogen Receptor (Negative, <1%): 94%, STRONG STAINING INTENSITY Progesterone Receptor (Negative, <1%): 4%, STRONG STAINING INTENSITY Proliferation Marker Ki67 by M IB-1 (Low<20%): 10% All controls stained appropriately Pecola Leisure MD Pathologist, Electronic Signature ( Signed 01/06/2012) 2. CHROMOGENIC IN-SITU HYBRIDIZATION Interpretation HER-2/NEU BY CISH - NO AMPLIFICATION OF HER-2 DETECTED. THE RATIO OF HER-2: CEP 17 SIGNALS WAS 1.56. Reference range: Ratio: HER2:CEP17 < 1.8 - gene amplification not observed Ratio: HER2:CEP 17 1.8-2.2 - equivocal result Ratio: HER2:CEP17 > 2.2 - gene amplification observed Pecola Leisure MD Pathologist, Electronic Signature ( Signed 01/06/2012) 1 of 3 FINAL for LATTIE, RIEGE [phone]) FINAL DIAGNOSIS Diagnosis 1. Lymph node, needle/core biopsy, right axilla - ONE BENIGN LYMPH NODE WITH NO TUMOR SEEN (0/1). - SEE COMMENT. 2. Breast, right, needle core biopsy - INVASIVE LOBULAR CARCINOMA. - SEE COMMENT. Microscopic Comment 1. An immunohistochemical stain is performed for cytokeratin AE1/AE3 which is negative confirming the above diagnosis. The results are called to [facility] on 01/01/2012. Dr. Luisa Hart has seen the right axilla lymph node biopsy in consultation with agreement. 2. An E. cadherin immunochemical stain is performed which is negative confirming the above diagnosis. Although definitive grading of breast carcinoma is best done on excision, the features of the tumor from the right needle core biopsies are compatible with a Grade I-II breast carcinoma. Breast prognostic markers will be  performed and reported in an addendum. The findings are called to [facility] on 01/01/2012. Dr. Luisa Hart has seen the right needle core biopsy in consultation with agreement  ASSESSMENT    51 year old female with 1.3 cm invasive lobular carcinoma which is grade one or 2 ER +94% PR 4% HER-2/neu negative proliferation marker 10%. Patient is status post right breast core needle biopsy performed on 12/30/2011. Patient has been seen by Dr. Harriette Bouillon. She is interested in breast conservation. Patient is seen today for discussion of adjuvant treatment versus neoadjuvant treatment. I have discussed this with the patient knows the pros and cons about neoadjuvant chemotherapy versus adjuvant treatment. Due to the size of the tumor I think she would be a good candidate for breast surgery up front. Once she has had her surgery I would sent her tumor for an Oncotype DX to evaluate her breast cancer recurrence score then we would make decisions regarding adjuvant systemic treatment  PLAN:    #1 recommend surgery consisting of a lumpectomy with sentinel node biopsy of front.  #2 I discussed genetic counseling and testing. I will refer her to our genetic counselor.  #3 patient needs to be seen by radiation oncology and we will make the referral.  #4 once patient has her surgery by a recommendation would be an Oncotype DX and this will be sent to evaluate her but her is cancer recurrence  score.   #5 patient was seen by the [facility] research nurses    Thank you so much for allowing me to participate in the care of Aika Brzoska. I will continue to follow up the patient with you and assist in her care.  All questions were answered. The patient knows to call the clinic with any problems, questions or concerns. We can certainly see the patient much sooner if necessary.  I spent 60 minutes counseling the patient face to face. The total time spent in the appointment was 60 minutes.  Drue Second, MD Medical/Oncology [facility] [phone] (beeper) [phone] (Office)  02/03/2012, 4:20 PM 02/03/2012, 4:20 PM

## 2012-02-04 ENCOUNTER — Telehealth: Payer: Self-pay | Admitting: *Deleted

## 2012-02-04 ENCOUNTER — Encounter: Payer: Self-pay | Admitting: *Deleted

## 2012-02-04 NOTE — Telephone Encounter (Signed)
sent md an message threw epic requesting an open slot per orders from 02-03-2012 requesting to see th patient in 6 to 7 weeks

## 2012-02-05 HISTORY — PX: BREAST LUMPECTOMY: SHX2

## 2012-02-05 HISTORY — PX: BREAST BIOPSY: SHX20

## 2012-02-10 ENCOUNTER — Other Ambulatory Visit (INDEPENDENT_AMBULATORY_CARE_PROVIDER_SITE_OTHER): Payer: Self-pay | Admitting: Surgery

## 2012-02-10 ENCOUNTER — Ambulatory Visit (INDEPENDENT_AMBULATORY_CARE_PROVIDER_SITE_OTHER): Admitting: Surgery

## 2012-02-10 ENCOUNTER — Encounter (INDEPENDENT_AMBULATORY_CARE_PROVIDER_SITE_OTHER): Payer: Self-pay | Admitting: Surgery

## 2012-02-10 VITALS — BP 116/82 | HR 70 | Temp 97.6°F | Resp 12 | Ht 71.0 in | Wt 175.4 lb

## 2012-02-10 DIAGNOSIS — C50919 Malignant neoplasm of unspecified site of unspecified female breast: Secondary | ICD-10-CM

## 2012-02-10 DIAGNOSIS — C50911 Malignant neoplasm of unspecified site of right female breast: Secondary | ICD-10-CM

## 2012-02-10 NOTE — Patient Instructions (Signed)
Lumpectomy, Breast Conserving Surgery A lumpectomy is breast surgery that removes only part of the breast. Another name used may be partial mastectomy. The amount removed varies. Make sure you understand how much of your breast will be removed. Reasons for a lumpectomy:  Any solid breast mass.   Grouped significant nodularity that may be confused with a solitary breast mass.  Lumpectomy is the most common form of breast cancer surgery today. The surgeon removes the portion of your breast which contains the tumor (cancer). This is the lump. Some normal tissue around the lump is also removed to be sure that all the tumor has been removed.  If cancer cells are found in the margins where the breast tissue was removed, your surgeon will do more surgery to remove the remaining cancer tissue. This is called re-excision surgery. Radiation and/or chemotherapy treatments are often given following a lumpectomy to kill any cancer cells that could possibly remain.  REASONS YOU MAY NOT BE ABLE TO HAVE BREAST CONSERVING SURGERY:  The tumor is located in more than one place.   Your breast is small and the tumor is large so the breast would be disfigured.   The entire tumor removal is not successful with a lumpectomy.   You cannot commit to a full course of chemotherapy, radiation therapy or are pregnant and cannot have radiation.   You have previously had radiation to the breast to treat cancer.  HOW A LUMPECTOMY IS PERFORMED If overnight nursing is not required following a biopsy, a lumpectomy can be performed as a same-day surgery. This can be done in a hospital, clinic, or surgical center. The anesthesia used will depend on your surgeon. They will discuss this with you. A general anesthetic keeps you sleeping through the procedure. LET YOUR CAREGIVERS KNOW ABOUT THE FOLLOWING:  Allergies   Medications taken including herbs, eye drops, over the counter medications, and creams.   Use of steroids (by  mouth or creams)   Previous problems with anesthetics or Novocaine.   Possibility of pregnancy, if this applies   History of blood clots (thrombophlebitis)   History of bleeding or blood problems.   Previous surgery   Other health problems  BEFORE THE PROCEDURE You should be present one hour prior to your procedure unless directed otherwise.  AFTER THE PROCEDURE  After surgery, you will be taken to the recovery area where a nurse will watch and check your progress. Once you're awake, stable, and taking fluids well, barring other problems you will be allowed to go home.   Ice packs applied to your operative site may help with discomfort and keep the swelling down.   A small rubber drain may be placed in the breast for a couple of days to prevent a hematoma from developing in the breast.   A pressure dressing may be applied for 24 to 48 hours to prevent bleeding.   Keep the wound dry.   You may resume a normal diet and activities as directed. Avoid strenuous activities affecting the arm on the side of the biopsy site such as tennis, swimming, heavy lifting (more than 10 pounds) or pulling.   Bruising in the breast is normal following this procedure.   Wearing a bra - even to bed - may be more comfortable and also help keep the dressing on.   Change dressings as directed.   Only take over-the-counter or prescription medicines for pain, discomfort, or fever as directed by your caregiver.  Call for your results as   instructed by your surgeon. Remember it is your responsibility to get the results of your lumpectomy if your surgeon asked you to follow-up. Do not assume everything is fine if you have not heard from your caregiver. SEEK MEDICAL CARE IF:   There is increased bleeding (more than a small spot) from the wound.   You notice redness, swelling, or increasing pain in the wound.   Pus is coming from wound.   An unexplained oral temperature above 102 F (38.9 C) develops.     You notice a foul smell coming from the wound or dressing.  SEEK IMMEDIATE MEDICAL CARE IF:   You develop a rash.   You have difficulty breathing.   You have any allergic problems.  Document Released: 11/04/2006 Document Revised: 09/12/2011 Document Reviewed: 02/05/2007 [employer] Patient Information 2012 [employer]. 

## 2012-02-10 NOTE — Progress Notes (Signed)
Patient ID: Courtney Terry, female   DOB: [birth year], 51 y.o.   MRN: [id number]  Chief Complaint  Patient presents with  . Follow-up    reck br and poss discuss sx    HPI Courtney Terry is a 51 y.o. female. Pt sent ata the request of Dr Judyann Munson due to right breast cancer.  Pt is incarcerated for the last 7 months.  She noticed a mass in her right breast for 7 months.  It is sore and getting larger.  No nipple discharge.  Left breast normal.  No family history of breast cancer.  Brother dying of cancer in jail. HPI  Past Medical History  Diagnosis Date  . Thyroid disease   . Seizures     epilepsy  . Chronic kidney disease   . Breast cancer 01/2012    right    Past Surgical History  Procedure Date  . Knee surgery     right    Family History  Problem Relation Age of Onset  . Heart disease Mother   . Cancer Sister     cervical cancer  . Cancer Brother     colon  . Breast cancer Sister     Social History History  Substance Use Topics  . Smoking status: Current Everyday Smoker -- 0.2 packs/day    Types: Cigarettes  . Smokeless tobacco: Not on file   Comment: less than .25 pack  . Alcohol Use: No    No Known Allergies  Current Outpatient Prescriptions  Medication Sig Dispense Refill  . divalproex (DEPAKOTE) 500 MG DR tablet Take 500 mg by mouth 2 (two) times daily.      [name] Kitchen levothyroxine (SYNTHROID, LEVOTHROID) 75 MCG tablet Take 75 mcg by mouth daily.        Review of Systems Review of Systems  Constitutional: Negative for fever, chills and unexpected weight change.  HENT: Negative for hearing loss, congestion, sore throat, trouble swallowing and voice change.   Eyes: Negative for visual disturbance.  Respiratory: Negative for cough and wheezing.   Cardiovascular: Negative for chest pain, palpitations and leg swelling.  Gastrointestinal: Negative for nausea, vomiting, abdominal pain, diarrhea, constipation, blood in stool, abdominal distention and anal bleeding.    Genitourinary: Negative for hematuria, vaginal bleeding and difficulty urinating.  Musculoskeletal: Negative for arthralgias.  Skin: Negative for rash and wound.  Neurological: Negative for seizures, syncope and headaches.  Hematological: Negative for adenopathy. Does not bruise/bleed easily.  Psychiatric/Behavioral: Negative for confusion.    Blood pressure 116/82, pulse 70, temperature 97.6 F (36.4 C), temperature source Temporal, resp. rate 12, height 5\' 11"  (1.803 m), weight 175 lb 6.4 oz (79.561 kg).  Physical Exam Physical Exam  Constitutional: She is oriented to person, place, and time. She appears well-developed and well-nourished.  HENT:  Head: Normocephalic and atraumatic.  Eyes: EOM are normal. Pupils are equal, round, and reactive to light.  Neck: Normal range of motion. Neck supple.  Cardiovascular: Normal rate and regular rhythm.   Pulmonary/Chest: Effort normal and breath sounds normal.       Right breast mass upper outer quadrant mobile about 2 cm.  No axillary adenopathy.  Left breast normal.  No supraclavicular adenopathy.  Abdominal: Soft. Bowel sounds are normal.  Musculoskeletal: Normal range of motion.  Neurological: She is alert and oriented to person, place, and time.  Skin: Skin is warm and dry.  Psychiatric: She has a normal mood and affect. Her behavior is normal. Judgment and thought content normal.  Data Reviewed U/S RIGHT BREAST 1.1 CM MASS SEEN ON MAMMOGRAM AS WELL. Path   Lobular carcinoma er / pr positive her 2 neu negative 10 % KI 67  Assessment    STAGE 2 LOBULAR CARCINOMA RIGHT BREAST    Plan     She will like to conserve her breast. Lymph node biopsy was negative. I discussed lumpectomy with sentinel lymph node mapping. Risk, benefits, and alternative was discussed  The procedure has been discussed with the patient. Alternatives to surgery have been discussed with the patient.  Risks of surgery include bleeding,  Infection,  Seroma  formation, death,  and the need for further surgery.   The patient understands and wishes to proceed.Sentinel lymph node mapping and dissection has been discussed with the patient.  Risk of bleeding,  Infection,  Seroma formation,  Additional procedures,,  Shoulder weakness ,  Shoulder stiffness,  Nerve and blood vessel injury and reaction to the mapping dyes have been discussed.  Alternatives to surgery have been discussed with the patient.  The patient agrees to proceed.    [name] A. 02/10/2012, 10:32 AM

## 2012-02-12 ENCOUNTER — Telehealth (INDEPENDENT_AMBULATORY_CARE_PROVIDER_SITE_OTHER): Payer: Self-pay | Admitting: General Surgery

## 2012-02-12 ENCOUNTER — Encounter (INDEPENDENT_AMBULATORY_CARE_PROVIDER_SITE_OTHER): Payer: Self-pay

## 2012-02-12 NOTE — Telephone Encounter (Signed)
Patient called in stated due to her surgery scheduled on 03/06/12, she will need a letter faxed to her attorney. She's currently scheduled for court that day. Her attorney is Pauline Aus at [phone], fax # [phone].

## 2012-02-12 NOTE — Telephone Encounter (Signed)
Send note

## 2012-02-18 ENCOUNTER — Encounter: Payer: Self-pay | Admitting: Radiation Oncology

## 2012-02-18 NOTE — Progress Notes (Signed)
51 year old female. Widow, 1 daughter (46-4 months old). Menarche age 69, G8P1O1   Patient palpated a mass in her right breast some seven months ago. Patient has been incarcerated for the last seven months. Recently patient had a mammogram performed that showed a 1.3 cm spiculated mass. Ultrasound showed an 11 mm mass. Biopsy showed invasive lobular carcinoma ER PR positive, HER 2 negative with a proliferation marker 10%. Lumpectomy with sentinel node mapping scheduled for 03/06/12. Oncotype testing has been recommended. Patient scheduled to see Dr. Welton Flakes 6/13/201. Post op recheck with Dr. Luisa Hart scheduled for 03/20/2012.    Brother jailed and dying of cancer.   No Known Allergies No indication of a pacemaker No history of radiation therapy

## 2012-02-19 ENCOUNTER — Ambulatory Visit: Admission: RE | Admit: 2012-02-19 | Source: Ambulatory Visit

## 2012-02-19 ENCOUNTER — Ambulatory Visit: Admitting: Radiation Oncology

## 2012-02-27 ENCOUNTER — Ambulatory Visit: Admission: RE | Admit: 2012-02-27 | Payer: Medicaid Other | Source: Ambulatory Visit | Admitting: Radiation Oncology

## 2012-02-27 ENCOUNTER — Ambulatory Visit: Payer: Medicaid Other | Attending: Radiation Oncology

## 2012-03-04 ENCOUNTER — Encounter (HOSPITAL_BASED_OUTPATIENT_CLINIC_OR_DEPARTMENT_OTHER): Payer: Self-pay | Admitting: *Deleted

## 2012-03-04 NOTE — Progress Notes (Signed)
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

## 2012-03-05 ENCOUNTER — Encounter (HOSPITAL_BASED_OUTPATIENT_CLINIC_OR_DEPARTMENT_OTHER): Payer: Self-pay | Admitting: *Deleted

## 2012-03-06 ENCOUNTER — Encounter (HOSPITAL_BASED_OUTPATIENT_CLINIC_OR_DEPARTMENT_OTHER): Payer: Self-pay | Admitting: *Deleted

## 2012-03-06 ENCOUNTER — Encounter (HOSPITAL_BASED_OUTPATIENT_CLINIC_OR_DEPARTMENT_OTHER): Payer: Self-pay | Admitting: Anesthesiology

## 2012-03-06 ENCOUNTER — Encounter (HOSPITAL_BASED_OUTPATIENT_CLINIC_OR_DEPARTMENT_OTHER): Payer: Self-pay | Admitting: Surgery

## 2012-03-06 ENCOUNTER — Encounter (HOSPITAL_BASED_OUTPATIENT_CLINIC_OR_DEPARTMENT_OTHER)
Admission: RE | Disposition: A | Discharge status: Home / Self Care | Payer: Self-pay | Source: Ambulatory Visit | Attending: Surgery

## 2012-03-06 ENCOUNTER — Ambulatory Visit
Admission: RE | Admit: 2012-03-06 | Discharge: 2012-03-06 | Disposition: A | Discharge status: Home / Self Care | Payer: Medicaid Other | Source: Ambulatory Visit | Attending: Surgery | Admitting: Surgery

## 2012-03-06 ENCOUNTER — Ambulatory Visit (HOSPITAL_COMMUNITY): Admission: RE | Admit: 2012-03-06 | Payer: Medicaid Other | Source: Ambulatory Visit | Admitting: Surgery

## 2012-03-06 ENCOUNTER — Ambulatory Visit (HOSPITAL_COMMUNITY)
Admission: RE | Admit: 2012-03-06 | Discharge: 2012-03-06 | Disposition: A | Discharge status: Home / Self Care | Payer: Medicaid Other | Source: Ambulatory Visit | Attending: Surgery | Admitting: Surgery

## 2012-03-06 ENCOUNTER — Ambulatory Visit (HOSPITAL_BASED_OUTPATIENT_CLINIC_OR_DEPARTMENT_OTHER): Payer: Medicaid Other | Admitting: Anesthesiology

## 2012-03-06 ENCOUNTER — Ambulatory Visit (HOSPITAL_BASED_OUTPATIENT_CLINIC_OR_DEPARTMENT_OTHER)
Admission: RE | Admit: 2012-03-06 | Discharge: 2012-03-06 | Disposition: A | Discharge status: Home / Self Care | Payer: Medicaid Other | Source: Ambulatory Visit | Attending: Surgery | Admitting: Surgery

## 2012-03-06 ENCOUNTER — Ambulatory Visit
Admit: 2012-03-06 | Discharge: 2012-03-06 | Disposition: A | Discharge status: Home / Self Care | Payer: Medicaid Other | Attending: Surgery | Admitting: Surgery

## 2012-03-06 DIAGNOSIS — R569 Unspecified convulsions: Secondary | ICD-10-CM | POA: Insufficient documentation

## 2012-03-06 DIAGNOSIS — C50911 Malignant neoplasm of unspecified site of right female breast: Secondary | ICD-10-CM

## 2012-03-06 DIAGNOSIS — J45909 Unspecified asthma, uncomplicated: Secondary | ICD-10-CM | POA: Insufficient documentation

## 2012-03-06 DIAGNOSIS — C50919 Malignant neoplasm of unspecified site of unspecified female breast: Secondary | ICD-10-CM | POA: Insufficient documentation

## 2012-03-06 HISTORY — DX: Unspecified osteoarthritis, unspecified site: M19.90

## 2012-03-06 HISTORY — PX: BREAST LUMPECTOMY WITH NEEDLE LOCALIZATION AND AXILLARY SENTINEL LYMPH NODE BX: SHX5760

## 2012-03-06 LAB — POCT I-STAT, CHEM 8
Calcium, Ion: 1.21 mmol/L (ref 1.12–1.32)
Chloride: 102 mEq/L (ref 96–112)
HCT: 51 % — ABNORMAL HIGH (ref 36.0–46.0)
Sodium: 151 mEq/L — ABNORMAL HIGH (ref 135–145)
TCO2: 26 mmol/L (ref 0–100)

## 2012-03-06 SURGERY — BREAST LUMPECTOMY WITH NEEDLE LOCALIZATION AND AXILLARY SENTINEL LYMPH NODE BX
Anesthesia: General | Site: Breast | Laterality: Right | Wound class: Clean

## 2012-03-06 MED ORDER — DEXAMETHASONE SODIUM PHOSPHATE 4 MG/ML IJ SOLN
INTRAMUSCULAR | Status: DC | PRN
  Administered 2012-03-06: 10 mg via INTRAVENOUS

## 2012-03-06 MED ORDER — TECHNETIUM TC 99M SULFUR COLLOID FILTERED
1.0000 | Freq: Once | INTRAVENOUS | Status: AC | PRN
  Administered 2012-03-06: 1 via INTRADERMAL

## 2012-03-06 MED ORDER — HYDROMORPHONE HCL PF 1 MG/ML IJ SOLN: 0.2500 mg | INTRAMUSCULAR | Status: DC | PRN

## 2012-03-06 MED ORDER — PROPOFOL 10 MG/ML IV EMUL
INTRAVENOUS | Status: DC | PRN
  Administered 2012-03-06: 200 mg via INTRAVENOUS

## 2012-03-06 MED ORDER — LIDOCAINE HCL (CARDIAC) 20 MG/ML IV SOLN
INTRAVENOUS | Status: DC | PRN
  Administered 2012-03-06: 50 mg via INTRAVENOUS

## 2012-03-06 MED ORDER — MIDAZOLAM HCL 2 MG/2ML IJ SOLN
1.0000 mg | INTRAMUSCULAR | Status: DC | PRN
  Administered 2012-03-06: 2 mg via INTRAVENOUS

## 2012-03-06 MED ORDER — METHYLENE BLUE 1 % INJ SOLN
INTRAMUSCULAR | Status: DC | PRN
  Administered 2012-03-06: 2 mL via SUBMUCOSAL

## 2012-03-06 MED ORDER — OXYCODONE-ACETAMINOPHEN 10-325 MG PO TABS: 1.0000 | ORAL_TABLET | Freq: Four times a day (QID) | ORAL | Status: DC | PRN

## 2012-03-06 MED ORDER — LACTATED RINGERS IV SOLN
INTRAVENOUS | Status: DC
  Administered 2012-03-06: 13:00:00 via INTRAVENOUS

## 2012-03-06 MED ORDER — ONDANSETRON HCL 4 MG/2ML IJ SOLN
INTRAMUSCULAR | Status: DC | PRN
  Administered 2012-03-06: 4 mg via INTRAVENOUS

## 2012-03-06 MED ORDER — SODIUM CHLORIDE 0.9 % IJ SOLN
INTRAMUSCULAR | Status: DC | PRN
  Administered 2012-03-06: 3 mL via INTRAVENOUS

## 2012-03-06 MED ORDER — ONDANSETRON HCL 4 MG/2ML IJ SOLN
4.0000 mg | Freq: Four times a day (QID) | INTRAMUSCULAR | Status: DC | PRN
Start: 2012-03-06 — End: 2012-03-06

## 2012-03-06 MED ORDER — FENTANYL CITRATE 0.05 MG/ML IJ SOLN
INTRAMUSCULAR | Status: DC | PRN
  Administered 2012-03-06: 75 ug via INTRAVENOUS
  Administered 2012-03-06: 25 ug via INTRAVENOUS

## 2012-03-06 MED ORDER — CEFAZOLIN SODIUM-DEXTROSE 2-3 GM-% IV SOLR
2.0000 g | INTRAVENOUS | Status: AC
  Administered 2012-03-06: 2 g via INTRAVENOUS

## 2012-03-06 MED ORDER — BUPIVACAINE-EPINEPHRINE 0.25% -1:200000 IJ SOLN
INTRAMUSCULAR | Status: DC | PRN
  Administered 2012-03-06: 20 mL

## 2012-03-06 MED ORDER — CHLORHEXIDINE GLUCONATE 4 % EX LIQD: 1.0000 "application " | Freq: Once | CUTANEOUS | Status: DC

## 2012-03-06 MED ORDER — FENTANYL CITRATE 0.05 MG/ML IJ SOLN
50.0000 ug | INTRAMUSCULAR | Status: DC | PRN
  Administered 2012-03-06: 100 ug via INTRAVENOUS

## 2012-03-06 MED ORDER — LABETALOL HCL 5 MG/ML IV SOLN
INTRAVENOUS | Status: DC | PRN
  Administered 2012-03-06: 5 mg via INTRAVENOUS

## 2012-03-06 SURGICAL SUPPLY — 60 items
ADH SKN CLS APL DERMABOND .7 (GAUZE/BANDAGES/DRESSINGS) ×1
APPLIER CLIP 9.375 MED OPEN (MISCELLANEOUS) ×4
APR CLP MED 9.3 20 MLT OPN (MISCELLANEOUS) ×2
BANDAGE ELASTIC 6 VELCRO ST LF (GAUZE/BANDAGES/DRESSINGS) IMPLANT
BINDER BREAST LRG (GAUZE/BANDAGES/DRESSINGS) ×1 IMPLANT
BLADE SURG 10 STRL SS (BLADE) ×1 IMPLANT
BLADE SURG 15 STRL LF DISP TIS (BLADE) ×1 IMPLANT
BLADE SURG 15 STRL SS (BLADE) ×2
BLADE SURG ROTATE 9660 (MISCELLANEOUS) IMPLANT
CANISTER SUCTION 1200CC (MISCELLANEOUS) ×2 IMPLANT
CHLORAPREP W/TINT 26ML (MISCELLANEOUS) ×2 IMPLANT
CLIP APPLIE 9.375 MED OPEN (MISCELLANEOUS) IMPLANT
CLOTH BEACON ORANGE TIMEOUT ST (SAFETY) ×2 IMPLANT
COVER MAYO STAND STRL (DRAPES) ×2 IMPLANT
COVER PROBE W GEL 5X96 (DRAPES) ×2 IMPLANT
COVER TABLE BACK 60X90 (DRAPES) ×2 IMPLANT
DECANTER SPIKE VIAL GLASS SM (MISCELLANEOUS) IMPLANT
DERMABOND ADVANCED (GAUZE/BANDAGES/DRESSINGS) ×1
DERMABOND ADVANCED .7 DNX12 (GAUZE/BANDAGES/DRESSINGS) ×2 IMPLANT
DEVICE DUBIN W/COMP PLATE 8390 (MISCELLANEOUS) ×1 IMPLANT
DRAIN CHANNEL 19F RND (DRAIN) IMPLANT
DRAIN HEMOVAC 1/8 X 5 (WOUND CARE) IMPLANT
DRAPE LAPAROSCOPIC ABDOMINAL (DRAPES) ×2 IMPLANT
DRAPE UTILITY XL STRL (DRAPES) ×2 IMPLANT
ELECT COATED BLADE 2.86 ST (ELECTRODE) ×2 IMPLANT
ELECT REM PT RETURN 9FT ADLT (ELECTROSURGICAL) ×2
ELECTRODE REM PT RTRN 9FT ADLT (ELECTROSURGICAL) ×1 IMPLANT
EVACUATOR SILICONE 100CC (DRAIN) IMPLANT
GAUZE SPONGE 4X4 12PLY STRL LF (GAUZE/BANDAGES/DRESSINGS) IMPLANT
GLOVE BIOGEL PI IND STRL 7.0 (GLOVE) IMPLANT
GLOVE BIOGEL PI IND STRL 8 (GLOVE) ×1 IMPLANT
GLOVE BIOGEL PI INDICATOR 7.0 (GLOVE) ×1
GLOVE BIOGEL PI INDICATOR 8 (GLOVE) ×1
GLOVE ECLIPSE 6.5 STRL STRAW (GLOVE) IMPLANT
GLOVE ECLIPSE 8.0 STRL XLNG CF (GLOVE) ×2 IMPLANT
GOWN PREVENTION PLUS XLARGE (GOWN DISPOSABLE) ×1 IMPLANT
HEMOSTAT SURGICEL 2X14 (HEMOSTASIS) ×4 IMPLANT
KIT MARKER MARGIN INK (KITS) ×2 IMPLANT
NDL HYPO 25X1 1.5 SAFETY (NEEDLE) ×2 IMPLANT
NDL SAFETY ECLIPSE 18X1.5 (NEEDLE) ×1 IMPLANT
NEEDLE HYPO 18GX1.5 SHARP (NEEDLE) ×2
NEEDLE HYPO 25X1 1.5 SAFETY (NEEDLE) ×4 IMPLANT
NS IRRIG 1000ML POUR BTL (IV SOLUTION) ×2 IMPLANT
PACK BASIN DAY SURGERY FS (CUSTOM PROCEDURE TRAY) ×2 IMPLANT
PENCIL BUTTON HOLSTER BLD 10FT (ELECTRODE) ×2 IMPLANT
PIN SAFETY STERILE (MISCELLANEOUS) IMPLANT
SLEEVE SCD COMPRESS KNEE MED (MISCELLANEOUS) ×2 IMPLANT
SPONGE LAP 18X18 X RAY DECT (DISPOSABLE) IMPLANT
SPONGE LAP 4X18 X RAY DECT (DISPOSABLE) ×3 IMPLANT
SUT ETHILON 3 0 PS 1 (SUTURE) IMPLANT
SUT MNCRL AB 3-0 PS2 18 (SUTURE) ×4 IMPLANT
SUT SILK 2 0 SH (SUTURE) IMPLANT
SUT VICRYL 3-0 CR8 SH (SUTURE) ×3 IMPLANT
SYR BULB 3OZ (MISCELLANEOUS) ×2 IMPLANT
SYR CONTROL 10ML LL (SYRINGE) ×4 IMPLANT
TOWEL OR 17X24 6PK STRL BLUE (TOWEL DISPOSABLE) ×1 IMPLANT
TOWEL OR NON WOVEN STRL DISP B (DISPOSABLE) ×2 IMPLANT
TUBE CONNECTING 20X1/4 (TUBING) ×2 IMPLANT
WATER STERILE IRR 1000ML POUR (IV SOLUTION) ×1 IMPLANT
YANKAUER SUCT BULB TIP NO VENT (SUCTIONS) ×2 IMPLANT

## 2012-03-06 NOTE — Anesthesia Preprocedure Evaluation (Signed)
Anesthesia Evaluation  Patient identified by MRN, date of birth, ID band Patient awake    Reviewed: Allergy & Precautions, H&P , NPO status , Patient's Chart, lab work & pertinent test results  Airway Mallampati: II  Neck ROM: full    Dental   Pulmonary asthma ,          Cardiovascular     Neuro/Psych Seizures -,     GI/Hepatic   Endo/Other    Renal/GU      Musculoskeletal   Abdominal   Peds  Hematology   Anesthesia Other Findings   Reproductive/Obstetrics                           Anesthesia Physical Anesthesia Plan  ASA: II  Anesthesia Plan: General   Post-op Pain Management:    Induction: Intravenous  Airway Management Planned: LMA  Additional Equipment:   Intra-op Plan:   Post-operative Plan:   Informed Consent: I have reviewed the patients History and Physical, chart, labs and discussed the procedure including the risks, benefits and alternatives for the proposed anesthesia with the patient or authorized representative who has indicated his/her understanding and acceptance.     Plan Discussed with: CRNA and Surgeon  Anesthesia Plan Comments:         Anesthesia Quick Evaluation

## 2012-03-06 NOTE — Interval H&P Note (Signed)
History and Physical Interval Note:  03/06/2012 1:30 PM  Courtney Terry  has presented today for surgery, with the diagnosis of right breast cancer  The various methods of treatment have been discussed with the patient and family. After consideration of risks, benefits and other options for treatment, the patient has consented to  Procedure(s) (LRB): BREAST LUMPECTOMY WITH NEEDLE LOCALIZATION AND AXILLARY SENTINEL LYMPH NODE BX (Right) as a surgical intervention .  The patients' history has been reviewed, patient examined, no change in status, stable for surgery.  I have reviewed the patients' chart and labs.  Questions were answered to the patient's satisfaction.     Shivonne Schwartzman A.

## 2012-03-06 NOTE — H&P (View-Only) (Signed)
Patient ID: Courtney Terry, female   DOB: 08/21/1961, 51 y.o.   MRN: [id number]  Chief Complaint  Patient presents with  . Follow-up    reck br and poss discuss sx    HPI Courtney Terry is a 51 y.o. female. Pt sent ata the request of Dr Arceo due to right breast cancer.  Pt is incarcerated for the last 7 months.  She noticed a mass in her right breast for 7 months.  It is sore and getting larger.  No nipple discharge.  Left breast normal.  No family history of breast cancer.  Brother dying of cancer in jail. HPI  Past Medical History  Diagnosis Date  . Thyroid disease   . Seizures     epilepsy  . Chronic kidney disease   . Breast cancer 01/2012    right    Past Surgical History  Procedure Date  . Knee surgery     right    Family History  Problem Relation Age of Onset  . Heart disease Mother   . Cancer Sister     cervical cancer  . Cancer Brother     colon  . Breast cancer Sister     Social History History  Substance Use Topics  . Smoking status: Current Everyday Smoker -- 0.2 packs/day    Types: Cigarettes  . Smokeless tobacco: Not on file   Comment: less than .25 pack  . Alcohol Use: No    No Known Allergies  Current Outpatient Prescriptions  Medication Sig Dispense Refill  . divalproex (DEPAKOTE) 500 MG DR tablet Take 500 mg by mouth 2 (two) times daily.      . levothyroxine (SYNTHROID, LEVOTHROID) 75 MCG tablet Take 75 mcg by mouth daily.        Review of Systems Review of Systems  Constitutional: Negative for fever, chills and unexpected weight change.  HENT: Negative for hearing loss, congestion, sore throat, trouble swallowing and voice change.   Eyes: Negative for visual disturbance.  Respiratory: Negative for cough and wheezing.   Cardiovascular: Negative for chest pain, palpitations and leg swelling.  Gastrointestinal: Negative for nausea, vomiting, abdominal pain, diarrhea, constipation, blood in stool, abdominal distention and anal bleeding.    Genitourinary: Negative for hematuria, vaginal bleeding and difficulty urinating.  Musculoskeletal: Negative for arthralgias.  Skin: Negative for rash and wound.  Neurological: Negative for seizures, syncope and headaches.  Hematological: Negative for adenopathy. Does not bruise/bleed easily.  Psychiatric/Behavioral: Negative for confusion.    Blood pressure 116/82, pulse 70, temperature 97.6 F (36.4 C), temperature source Temporal, resp. rate 12, height 5' 11" (1.803 m), weight 175 lb 6.4 oz (79.561 kg).  Physical Exam Physical Exam  Constitutional: She is oriented to person, place, and time. She appears well-developed and well-nourished.  HENT:  Head: Normocephalic and atraumatic.  Eyes: EOM are normal. Pupils are equal, round, and reactive to light.  Neck: Normal range of motion. Neck supple.  Cardiovascular: Normal rate and regular rhythm.   Pulmonary/Chest: Effort normal and breath sounds normal.       Right breast mass upper outer quadrant mobile about 2 cm.  No axillary adenopathy.  Left breast normal.  No supraclavicular adenopathy.  Abdominal: Soft. Bowel sounds are normal.  Musculoskeletal: Normal range of motion.  Neurological: She is alert and oriented to person, place, and time.  Skin: Skin is warm and dry.  Psychiatric: She has a normal mood and affect. Her behavior is normal. Judgment and thought content normal.      Data Reviewed U/S RIGHT BREAST 1.1 CM MASS SEEN ON MAMMOGRAM AS WELL. [facility]   Lobular carcinoma er / pr positive her 2 neu negative 10 % KI 67  Assessment    STAGE 2 LOBULAR CARCINOMA RIGHT BREAST    Plan     She will like to conserve her breast. Lymph node biopsy was negative. I discussed lumpectomy with sentinel lymph node mapping. Risk, benefits, and alternative was discussed  The procedure has been discussed with the patient. Alternatives to surgery have been discussed with the patient.  Risks of surgery include bleeding,  Infection,  Seroma  formation, death,  and the need for further surgery.   The patient understands and wishes to proceed.Sentinel lymph node mapping and dissection has been discussed with the patient.  Risk of bleeding,  Infection,  Seroma formation,  Additional procedures,,  Shoulder weakness ,  Shoulder stiffness,  Nerve and blood vessel injury and reaction to the mapping dyes have been discussed.  Alternatives to surgery have been discussed with the patient.  The patient agrees to proceed.    Courtney Terry A. 02/10/2012, 10:32 AM    

## 2012-03-06 NOTE — Discharge Instructions (Signed)
[facility] °Office Phone Number [phone] ° °BREAST BIOPSY/ PARTIAL MASTECTOMY: POST OP INSTRUCTIONS ° °Always review your discharge instruction sheet given to you by the facility where your surgery was performed. ° °IF YOU HAVE DISABILITY OR FAMILY LEAVE FORMS, YOU MUST BRING THEM TO THE OFFICE FOR PROCESSING.  DO NOT GIVE THEM TO YOUR DOCTOR. ° °1. A prescription for pain medication may be given to you upon discharge.  Take your pain medication as prescribed, if needed.  If narcotic pain medicine is not needed, then you may take acetaminophen (Tylenol) or ibuprofen (Advil) as needed. °2. Take your usually prescribed medications unless otherwise directed °3. If you need a refill on your pain medication, please contact your pharmacy.  They will contact our office to request authorization.  Prescriptions will not be filled after 5pm or on week-ends. °4. You should eat very light the first 24 hours after surgery, such as soup, crackers, pudding, etc.  Resume your normal diet the day after surgery. °5. Most patients will experience some swelling and bruising in the breast.  Ice packs and a good support bra will help.  Swelling and bruising can take several days to resolve.  °6. It is common to experience some constipation if taking pain medication after surgery.  Increasing fluid intake and taking a stool softener will usually help or prevent this problem from occurring.  A mild laxative (Milk of Magnesia or Miralax) should be taken according to package directions if there are no bowel movements after 48 hours. °7. Unless discharge instructions indicate otherwise, you may remove your bandages 24-48 hours after surgery, and you may shower at that time.  You may have steri-strips (small skin tapes) in place directly over the incision.  These strips should be left on the skin for 7-10 days.  If your surgeon used skin glue on the incision, you may shower in 24 hours.  The glue will flake off over the  next 2-3 weeks.  Any sutures or staples will be removed at the office during your follow-up visit. °8. ACTIVITIES:  You may resume regular daily activities (gradually increasing) beginning the next day.  Wearing a good support bra or sports bra minimizes pain and swelling.  You may have sexual intercourse when it is comfortable. °a. You may drive when you no longer are taking prescription pain medication, you can comfortably wear a seatbelt, and you can safely maneuver your car and apply brakes. °b. RETURN TO WORK:  ______________________________________________________________________________________ °9. You should see your doctor in the office for a follow-up appointment approximately two weeks after your surgery.  Your doctor’s nurse will typically make your follow-up appointment when she calls you with your pathology report.  Expect your pathology report 2-3 business days after your surgery.  You may call to check if you do not hear from us after three days. °10. OTHER INSTRUCTIONS: _______________________________________________________________________________________________ _____________________________________________________________________________________________________________________________________ °_____________________________________________________________________________________________________________________________________ °_____________________________________________________________________________________________________________________________________ ° °WHEN TO CALL YOUR DOCTOR: °1. Fever over 101.0 °2. Nausea and/or vomiting. °3. Extreme swelling or bruising. °4. Continued bleeding from incision. °5. Increased pain, redness, or drainage from the incision. ° °The clinic staff is available to answer your questions during regular business hours.  Please don’t hesitate to call and ask to speak to one of the nurses for clinical concerns.  If you have a medical emergency, go to the nearest  emergency room or call 911.  A surgeon from [facility] is always on call at the hospital. ° °For further questions, please visit [email or web address]  ° ° °  Post Anesthesia Home Care Instructions ° °Activity: °Get plenty of rest for the remainder of the day. A responsible adult should stay with you for 24 hours following the procedure.  °For the next 24 hours, DO NOT: °-Drive a car °-Operate machinery °-Drink alcoholic beverages °-Take any medication unless instructed by your physician °-Make any legal decisions or sign important papers. ° °Meals: °Start with liquid foods such as gelatin or soup. Progress to regular foods as tolerated. Avoid greasy, spicy, heavy foods. If nausea and/or vomiting occur, drink only clear liquids until the nausea and/or vomiting subsides. Call your physician if vomiting continues. ° °Special Instructions/Symptoms: °Your throat may feel dry or sore from the anesthesia or the breathing tube placed in your throat during surgery. If this causes discomfort, gargle with warm salt water. The discomfort should disappear within 24 hours. ° °

## 2012-03-06 NOTE — Transfer of Care (Signed)
Immediate Anesthesia Transfer of Care Note  Patient: Courtney Terry  Procedure(s) Performed: Procedure(s) (LRB): BREAST LUMPECTOMY WITH NEEDLE LOCALIZATION AND AXILLARY SENTINEL LYMPH NODE BX (Right)  Patient Location: PACU  Anesthesia Type: General  Level of Consciousness: awake  Airway & Oxygen Therapy: Patient Spontanous Breathing and Patient connected to face mask oxygen  Post-op Assessment: Report given to PACU RN and Post -op Vital signs reviewed and stable  Post vital signs: Reviewed and stable  Complications: No apparent anesthesia complications

## 2012-03-06 NOTE — Interval H&P Note (Signed)
History and Physical Interval Note:  03/06/2012 1:33 PM  Courtney Terry  has presented today for surgery, with the diagnosis of right breast cancer  The various methods of treatment have been discussed with the patient and family. After consideration of risks, benefits and other options for treatment, the patient has consented to  Procedure(s) (LRB): BREAST LUMPECTOMY WITH NEEDLE LOCALIZATION AND AXILLARY SENTINEL LYMPH NODE BX (Right) as a surgical intervention .  The patients' history has been reviewed, patient examined, no change in status, stable for surgery.  I have reviewed the patients' chart and labs.  Questions were answered to the patient's satisfaction.     Courtney Terry A.

## 2012-03-06 NOTE — Anesthesia Procedure Notes (Signed)
Procedure Name: LMA Insertion Performed by: Philippe Gang W Pre-anesthesia Checklist: Patient identified, Timeout performed, Emergency Drugs available, Suction available and Patient being monitored Patient Re-evaluated:Patient Re-evaluated prior to inductionOxygen Delivery Method: Circle system utilized Preoxygenation: Pre-oxygenation with 100% oxygen Intubation Type: IV induction Ventilation: Mask ventilation without difficulty LMA: LMA inserted LMA Size: 4.0 Number of attempts: 1 Placement Confirmation: breath sounds checked- equal and bilateral and positive ETCO2 Tube secured with: Tape Dental Injury: Teeth and Oropharynx as per pre-operative assessment      

## 2012-03-06 NOTE — Op Note (Signed)
Breast Lumpectomy right  with right Sentinal Node Biopsy Procedure Note  Indications: This patient presents with history of right breast cancer with clinically negative axillary lymph node exam.The procedure has been discussed with the patient. Alternatives to surgery have been discussed with the patient.  Risks of surgery include bleeding,  Infection,  Seroma formation, death,  and the need for further surgery.   The patient understands and wishes to proceed.Sentinel lymph node mapping and dissection has been discussed with the patient.  Risk of bleeding,  Infection,  Seroma formation,  Additional procedures,,  Shoulder weakness ,  Shoulder stiffness,  Nerve and blood vessel injury and reaction to the mapping dyes have been discussed.  Alternatives to surgery have been discussed with the patient.  The patient agrees to proceed.  Pre-operative Diagnosis: right breast cancer stage 1 lobular  Post-operative Diagnosis: right breast cancer stage 1 lobular  Surgeon: Dolora Ridgely A.   Assistants: or staff  Anesthesia: General LMA anesthesia and Local anesthesia 0.25.% bupivacaine, with epinephrine  ASA Class: 3  Procedure Details  The patient was seen in the Holding Room. The risks, benefits, complications, treatment options, and expected outcomes were discussed with the patient. The possibilities of reaction to medication, pulmonary aspiration, bleeding, infection, the need for additional procedures, failure to diagnose a condition, and creating a complication requiring transfusion or operation were discussed with the patient. The patient concurred with the proposed plan, giving informed consent.  The site of surgery properly noted/marked. The patient was taken to Operating Room # 8 , identified as Courtney Terry and the procedure verified as Breast Lumpectomy and Sentinal Node Biopsy. A Time Out was held and the above information confirmed.  After induction of anesthesia, the right arm, breast, and  chest were prepped and draped in standard fashion.  Using a hand-held gamma probe, axillary sentinel nodes were identified transcutaneously.  An oblique incision was created below the axillary hairline.  Dissection was carried through the clavipectoral fascia.  1 level 1 axillary sentinel nodes were removed and submitted to pathology revealing normal gross  findings  The axillary incision was closed with a 3-0 Vicryl  And 4 0 monocryl subcuticular closure in layers.    The lumpectomy was performed by creating an oblique incision over the upper outer quadrant of the breastaround the previously placed localization guidewire.  Dissection was carried down to the pectoral fascia.  All tissue around the mass  Was excised  and the margins were inked.  Specimen radiography confirmed inclusion of the mammographic lesion.  Hemostasis was achieved with cautery and clips placed.  The wound was irrigated and closed with a 3-0 Vicryl and 4 0 monocryl  subcuticular closure in layers.  Dermabond applied.    Sterile dressings were applied. At the end of the operation, all sponge, instrument, and needle counts were correct.  Findings: grossly clear surgical margins  Estimated Blood Loss:  less than 50 mL         Drains: none         Total IV Fluids:         Specimens: mass  AND 1 SLN         Implants: NONE         Complications:  None; patient tolerated the procedure well.         Disposition: PACU - hemodynamically stable.         Condition: stable  Attending Attestation: I performed the procedure.

## 2012-03-06 NOTE — Anesthesia Postprocedure Evaluation (Signed)
Anesthesia Post Note  Patient: Courtney Terry  Procedure(s) Performed: Procedure(s) (LRB): BREAST LUMPECTOMY WITH NEEDLE LOCALIZATION AND AXILLARY SENTINEL LYMPH NODE BX (Right)  Anesthesia type: general  Patient location: PACU  Post pain: Pain level controlled  Post assessment: Patient's Cardiovascular Status Stable  Last Vitals:  Filed Vitals:   03/06/12 1517  BP:   Pulse: 67  Temp:   Resp: 18    Post vital signs: Reviewed and stable  Level of consciousness: sedated  Complications: No apparent anesthesia complications

## 2012-03-10 ENCOUNTER — Telehealth (INDEPENDENT_AMBULATORY_CARE_PROVIDER_SITE_OTHER): Payer: Self-pay

## 2012-03-10 NOTE — Telephone Encounter (Signed)
Called all numbers listed in Epic for patient not able to contact Courtney Terry. 

## 2012-03-10 NOTE — Telephone Encounter (Deleted)
Message copied by Maryan Puls on Tue Mar 10, 2012  5:24 PM ------      Message from: Harriette Bouillon A      Created: Tue Mar 10, 2012  2:42 PM       Node negative margins clear

## 2012-03-10 NOTE — Telephone Encounter (Deleted)
ca

## 2012-03-10 NOTE — Telephone Encounter (Deleted)
Called all numbers listed in Epic for patient not able to contact Ms. Courtney Terry.

## 2012-03-11 ENCOUNTER — Telehealth (INDEPENDENT_AMBULATORY_CARE_PROVIDER_SITE_OTHER): Payer: Self-pay

## 2012-03-11 NOTE — Telephone Encounter (Signed)
Called patient and gave her pathology results and appointment date and time. Patient understood the information.

## 2012-03-13 ENCOUNTER — Encounter (HOSPITAL_BASED_OUTPATIENT_CLINIC_OR_DEPARTMENT_OTHER): Payer: Self-pay

## 2012-03-16 ENCOUNTER — Telehealth (INDEPENDENT_AMBULATORY_CARE_PROVIDER_SITE_OTHER): Payer: Self-pay

## 2012-03-16 NOTE — Telephone Encounter (Signed)
Pt's sister called stating someone had stolen the pt's Percocet prescription and she was in a lot of pain.  The pt was unavailable to come to the phone.  Please advise.

## 2012-03-16 NOTE — Telephone Encounter (Signed)
Rx for Vicodin 5/500 #20 w/ no refills called to [facility] per Dr. Daphine Deutscher.  Pt is aware.

## 2012-03-17 NOTE — Telephone Encounter (Signed)
Can call in vicodin per protocol

## 2012-03-19 ENCOUNTER — Encounter: Payer: Self-pay | Admitting: *Deleted

## 2012-03-19 ENCOUNTER — Ambulatory Visit: Payer: Medicaid Other | Admitting: Oncology

## 2012-03-19 NOTE — Progress Notes (Signed)
Ordered Oncotype Dx test w/ [facility].  Faxed request to [facility]. 

## 2012-03-20 ENCOUNTER — Encounter (INDEPENDENT_AMBULATORY_CARE_PROVIDER_SITE_OTHER): Admitting: Surgery

## 2012-03-20 ENCOUNTER — Telehealth: Payer: Self-pay | Admitting: *Deleted

## 2012-03-20 NOTE — Telephone Encounter (Signed)
patient called in reschedule for 04-02-2012  

## 2012-03-25 ENCOUNTER — Encounter: Payer: Self-pay | Admitting: *Deleted

## 2012-03-31 ENCOUNTER — Encounter: Payer: Self-pay | Admitting: *Deleted

## 2012-03-31 NOTE — Progress Notes (Signed)
Received Oncotype Dx results of 25.  Gave copy to MD.  Took copy to Med Rec to scan. 

## 2012-04-01 ENCOUNTER — Telehealth (INDEPENDENT_AMBULATORY_CARE_PROVIDER_SITE_OTHER): Payer: Self-pay

## 2012-04-01 NOTE — Telephone Encounter (Signed)
Pathology results given to patient.  

## 2012-04-02 ENCOUNTER — Ambulatory Visit: Payer: Medicaid Other | Admitting: Oncology

## 2012-04-02 ENCOUNTER — Telehealth: Payer: Self-pay | Admitting: Oncology

## 2012-04-02 ENCOUNTER — Other Ambulatory Visit: Payer: Self-pay | Admitting: *Deleted

## 2012-04-02 ENCOUNTER — Encounter: Payer: Self-pay | Admitting: *Deleted

## 2012-04-02 ENCOUNTER — Other Ambulatory Visit: Payer: Medicaid Other | Admitting: Lab

## 2012-04-02 DIAGNOSIS — C50419 Malignant neoplasm of upper-outer quadrant of unspecified female breast: Secondary | ICD-10-CM

## 2012-04-02 NOTE — Telephone Encounter (Signed)
S/w the pt and she is aware of her sept 2013 appts °

## 2012-04-07 ENCOUNTER — Telehealth: Payer: Self-pay | Admitting: Oncology

## 2012-04-15 ENCOUNTER — Telehealth: Payer: Self-pay | Admitting: *Deleted

## 2012-04-15 NOTE — Telephone Encounter (Signed)
Pt [facility] 6/26. Attempted to reach pt at numbers listed in system. Called [phone]- received dial tone/beeping as if disconnected. [phone]- fax# [phone]-jail- person answered advised this is the jail when asked to speak with pt  [phone]-voice mail " this is Courtney Terry with health services please leave a message. Unable to reach pt.

## 2012-04-16 ENCOUNTER — Encounter (INDEPENDENT_AMBULATORY_CARE_PROVIDER_SITE_OTHER): Admitting: Surgery

## 2012-05-21 ENCOUNTER — Encounter (INDEPENDENT_AMBULATORY_CARE_PROVIDER_SITE_OTHER): Admitting: Surgery

## 2012-06-16 ENCOUNTER — Encounter (INDEPENDENT_AMBULATORY_CARE_PROVIDER_SITE_OTHER): Admitting: Surgery

## 2012-06-19 ENCOUNTER — Ambulatory Visit: Payer: Medicaid Other | Admitting: Oncology

## 2012-06-19 ENCOUNTER — Other Ambulatory Visit: Payer: Medicaid Other | Admitting: Lab

## 2012-06-19 ENCOUNTER — Telehealth: Payer: Self-pay | Admitting: Medical Oncology

## 2012-06-19 NOTE — Telephone Encounter (Signed)
[facility] 9/13:  Attempted to contact patient at numbers listed. [phone]: Cell phone number for Renee. [phone]: fax number [phone]: medical records  [phone]: Mertie Clause, Health Service Administrator

## 2012-06-23 ENCOUNTER — Encounter (INDEPENDENT_AMBULATORY_CARE_PROVIDER_SITE_OTHER): Payer: Self-pay | Admitting: Surgery

## 2012-06-23 ENCOUNTER — Ambulatory Visit (INDEPENDENT_AMBULATORY_CARE_PROVIDER_SITE_OTHER): Payer: Medicaid Other | Admitting: Surgery

## 2012-06-23 VITALS — BP 122/80 | HR 76 | Temp 99.0°F | Resp 14 | Ht 69.0 in | Wt 149.0 lb

## 2012-06-23 DIAGNOSIS — Z853 Personal history of malignant neoplasm of breast: Secondary | ICD-10-CM

## 2012-06-23 NOTE — Progress Notes (Signed)
Subjective:     Patient ID: Courtney Terry, female   DOB: 12/28/1960, 51 y.o.   MRN: [id number]  HPIPatient returns 4 months after right breast lumpectomy and sentinel lymph mapping for a T1 N0 MX right breast cancer. She was lost to followup. She complains of right breast pain or cold.   Review of Systems  Constitutional: Negative.   HENT: Negative.   Eyes: Negative.   Respiratory: Positive for cough.   Cardiovascular: Negative.   Gastrointestinal: Negative.        Objective:   Physical Exam  Pulmonary/Chest:         Assessment:     TNM: pT1c, pN0 Right breast cancer    Plan:     Return 6 months. Stop smoking.  See oncology.

## 2012-06-23 NOTE — Patient Instructions (Signed)
Stop smoking

## 2012-06-24 ENCOUNTER — Emergency Department (HOSPITAL_COMMUNITY): Payer: Medicaid Other

## 2012-06-24 ENCOUNTER — Encounter (HOSPITAL_COMMUNITY): Payer: Self-pay | Admitting: Emergency Medicine

## 2012-06-24 ENCOUNTER — Emergency Department (HOSPITAL_COMMUNITY)
Admission: EM | Admit: 2012-06-24 | Discharge: 2012-06-24 | Disposition: A | Discharge status: Home / Self Care | Payer: Medicaid Other | Attending: Emergency Medicine | Admitting: Emergency Medicine

## 2012-06-24 DIAGNOSIS — E079 Disorder of thyroid, unspecified: Secondary | ICD-10-CM | POA: Insufficient documentation

## 2012-06-24 DIAGNOSIS — J45909 Unspecified asthma, uncomplicated: Secondary | ICD-10-CM | POA: Insufficient documentation

## 2012-06-24 DIAGNOSIS — N189 Chronic kidney disease, unspecified: Secondary | ICD-10-CM | POA: Insufficient documentation

## 2012-06-24 DIAGNOSIS — G40909 Epilepsy, unspecified, not intractable, without status epilepticus: Secondary | ICD-10-CM | POA: Insufficient documentation

## 2012-06-24 DIAGNOSIS — F17200 Nicotine dependence, unspecified, uncomplicated: Secondary | ICD-10-CM | POA: Insufficient documentation

## 2012-06-24 DIAGNOSIS — Z853 Personal history of malignant neoplasm of breast: Secondary | ICD-10-CM | POA: Insufficient documentation

## 2012-06-24 DIAGNOSIS — J069 Acute upper respiratory infection, unspecified: Secondary | ICD-10-CM

## 2012-06-24 DIAGNOSIS — Z79899 Other long term (current) drug therapy: Secondary | ICD-10-CM | POA: Insufficient documentation

## 2012-06-24 MED ORDER — ALBUTEROL SULFATE (5 MG/ML) 0.5% IN NEBU
5.0000 mg | INHALATION_SOLUTION | Freq: Once | RESPIRATORY_TRACT | Status: AC
  Administered 2012-06-24: 5 mg via RESPIRATORY_TRACT
  Filled 2012-06-24: qty 0.5

## 2012-06-24 MED ORDER — DIPHENHYDRAMINE HCL 25 MG PO CAPS
25.0000 mg | ORAL_CAPSULE | Freq: Once | ORAL | Status: AC
  Administered 2012-06-24: 25 mg via ORAL
  Filled 2012-06-24: qty 1

## 2012-06-24 MED ORDER — IBUPROFEN 800 MG PO TABS: 800.0000 mg | ORAL_TABLET | Freq: Once | ORAL | Status: DC

## 2012-06-24 MED ORDER — LEVOTHYROXINE SODIUM 75 MCG PO TABS: 75.0000 ug | ORAL_TABLET | Freq: Every day | ORAL | Status: DC

## 2012-06-24 MED ORDER — IBUPROFEN 200 MG PO TABS
600.0000 mg | ORAL_TABLET | Freq: Once | ORAL | Status: AC
  Administered 2012-06-24: 600 mg via ORAL
  Filled 2012-06-24: qty 1

## 2012-06-24 NOTE — ED Notes (Signed)
Patient transported to X-ray 

## 2012-06-24 NOTE — ED Notes (Signed)
Pt reports productive cough, low grade fever, generalized body aches, unresponsive to OTC meds and inhaler. No audible wheezing, decreased bilat. breath sounds

## 2012-06-25 NOTE — ED Provider Notes (Signed)
History     CSN: [id number]  Arrival date & time 06/24/12  1013   First MD Initiated Contact with Patient 06/24/12 1029      Chief Complaint  Patient presents with  . Cough    x 3 days  . Fever    Pt report fever of 99.0  . Headache  . Generalized Body Aches    (Consider location/radiation/quality/duration/timing/severity/associated sxs/prior treatment) Patient is a 51 y.o. female presenting with cough, fever, and headaches. The history is provided by the patient. No language interpreter was used.  Cough This is a new problem. The current episode started yesterday. The problem occurs hourly. The problem has been gradually worsening. The cough is productive of sputum. Associated symptoms include headaches and rhinorrhea. Pertinent negatives include no chest pain, no chills, no ear congestion, no ear pain, no sore throat, no shortness of breath and no wheezing. She is a smoker. Her past medical history is significant for asthma. Her past medical history does not include pneumonia or COPD.  Fever Primary symptoms of the febrile illness include fever, headaches and cough. Primary symptoms do not include wheezing or shortness of breath.  Headache  Associated symptoms include a fever. Pertinent negatives include no shortness of breath.  URI x 24 hours.  Using inhaler at home with no improvement.  No fever/n/v.  pmh listed below.    Past Medical History  Diagnosis Date  . Thyroid disease   . Seizures     epilepsy  . Chronic kidney disease   . Breast cancer 01/2012    right  . Arthritis   . Asthma     Past Surgical History  Procedure Date  . Knee surgery     right  . Tonsillectomy   . Ovarian cyst surgery     Family History  Problem Relation Age of Onset  . Heart disease Mother   . Cancer Sister     cervical cancer  . Cancer Brother     colon  . Breast cancer Sister   . Cancer Sister     breast    History  Substance Use Topics  . Smoking status: Current Every Day  Smoker -- 0.2 packs/day    Types: Cigarettes  . Smokeless tobacco: Never Used   Comment: less than .25 pack  . Alcohol Use: Yes    OB History    Grav Para Term Preterm Abortions TAB SAB Ect Mult Living                  Review of Systems  Constitutional: Positive for fever. Negative for chills.  HENT: Positive for congestion, rhinorrhea, sneezing and postnasal drip. Negative for ear pain, sore throat, facial swelling, trouble swallowing, sinus pressure and ear discharge.   Eyes: Negative.   Respiratory: Positive for cough. Negative for shortness of breath and wheezing.   Cardiovascular: Negative.  Negative for chest pain.  Gastrointestinal: Negative.   Neurological: Positive for headaches.  Psychiatric/Behavioral: Negative.   All other systems reviewed and are negative.    Allergies  Review of patient's allergies indicates no known allergies.  Home Medications   Current Outpatient Rx  Name Route Sig Dispense Refill  . ALBUTEROL SULFATE HFA 108 (90 BASE) MCG/ACT IN AERS Inhalation Inhale 2 puffs into the lungs every 6 (six) hours as needed. Wheezing and shortness of breath    . DIVALPROEX SODIUM 500 MG PO TBEC Oral Take 500 mg by mouth 2 (two) times daily.    [name] Kitchen LEVOTHYROXINE SODIUM  75 MCG PO TABS Oral Take 1 tablet (75 mcg total) by mouth daily. 30 tablet 1    BP 132/81  Pulse 93  Temp 98.6 F (37 C) (Oral)  Resp 16  SpO2 95%  Physical Exam  Nursing note and vitals reviewed. Constitutional: She is oriented to person, place, and time. She appears well-developed and well-nourished.  HENT:  Head: Normocephalic and atraumatic.  Right Ear: Tympanic membrane normal.  Left Ear: Tympanic membrane normal.  Nose: Mucosal edema and rhinorrhea present. No sinus tenderness.  Eyes: Conjunctivae normal and EOM are normal. Pupils are equal, round, and reactive to light.  Neck: Normal range of motion. Neck supple.  Cardiovascular: Normal rate.   Pulmonary/Chest: Effort normal and  breath sounds normal. No respiratory distress. She has no wheezes.  Abdominal: Soft.  Musculoskeletal: Normal range of motion. She exhibits no edema and no tenderness.  Neurological: She is alert and oriented to person, place, and time. She has normal reflexes.  Skin: Skin is warm and dry.  Psychiatric: She has a normal mood and affect.    ED Course  Procedures (including critical care time)  Labs Reviewed - No data to display Dg Chest 2 View  06/24/2012  *RADIOLOGY REPORT*  Clinical Data: Cough, fever and headache.  CHEST - 2 VIEW  Comparison: None  Findings: The cardiac silhouette, mediastinal and hilar contours are normal.  Slightly prominent interstitial markings but no focal airspace consolidation or pleural effusion.  There are surgical changes involving the right breast and axilla.  Vague density at the right lung base is likely scarring change based on prior CT scan from 2005.  Bilateral nipple shadows are noted.  The bony thorax is intact.  IMPRESSION:  1.  Mild hyperinflation and prominent interstitial markings but no infiltrates or effusion. 2. Right basilar scarring changes.   Original Report Authenticated By: P. Loralie Champagne, M.D.      1. URI (upper respiratory infection)       MDM  URI x 24 hours.  Continue inhaler no more than 4 times a day.  Use ibuprofen and benadryl. Chest x-ray unremarkable reviewed by myself.  Follow up with pcp of choice.  Rx for synthroid for med refill.        Remi Haggard, NP 06/25/12 1729

## 2012-06-28 NOTE — ED Provider Notes (Signed)
Medical screening examination/treatment/procedure(s) were performed by non-physician practitioner and as supervising physician I was immediately available for consultation/collaboration.  Flint Melter, MD 06/28/12 [phone]

## 2012-07-27 NOTE — Addendum Note (Signed)
Addendum  created 07/27/12 1734 by Kipp Brood, MD   Modules edited:Anesthesia Responsible Staff

## 2012-07-27 NOTE — Addendum Note (Signed)
Addendum  created 07/27/12 1734 by Pennye Beeghly, MD   Modules edited:Anesthesia Responsible Staff    

## 2012-08-13 ENCOUNTER — Encounter (HOSPITAL_BASED_OUTPATIENT_CLINIC_OR_DEPARTMENT_OTHER): Payer: Self-pay | Admitting: Surgery

## 2012-08-13 NOTE — Addendum Note (Signed)
Addendum  created 08/13/12 0959 by Adarius Tigges S Pallas Wahlert, MD   Modules edited:Anesthesia Responsible Staff    

## 2012-08-13 NOTE — Addendum Note (Signed)
Addendum  created 08/13/12 0959 by Raiford Simmonds, MD   Modules edited:Anesthesia Responsible Staff

## 2012-09-17 ENCOUNTER — Emergency Department (HOSPITAL_COMMUNITY): Payer: Medicaid Other

## 2012-09-17 ENCOUNTER — Encounter (HOSPITAL_COMMUNITY): Payer: Self-pay | Admitting: *Deleted

## 2012-09-17 ENCOUNTER — Emergency Department (HOSPITAL_COMMUNITY)
Admission: EM | Admit: 2012-09-17 | Discharge: 2012-09-17 | Disposition: A | Discharge status: Home / Self Care | Payer: Medicaid Other | Attending: Emergency Medicine | Admitting: Emergency Medicine

## 2012-09-17 DIAGNOSIS — Z79899 Other long term (current) drug therapy: Secondary | ICD-10-CM | POA: Insufficient documentation

## 2012-09-17 DIAGNOSIS — E079 Disorder of thyroid, unspecified: Secondary | ICD-10-CM | POA: Insufficient documentation

## 2012-09-17 DIAGNOSIS — G40909 Epilepsy, unspecified, not intractable, without status epilepticus: Secondary | ICD-10-CM | POA: Insufficient documentation

## 2012-09-17 DIAGNOSIS — N189 Chronic kidney disease, unspecified: Secondary | ICD-10-CM | POA: Insufficient documentation

## 2012-09-17 DIAGNOSIS — Z8739 Personal history of other diseases of the musculoskeletal system and connective tissue: Secondary | ICD-10-CM | POA: Insufficient documentation

## 2012-09-17 DIAGNOSIS — Z853 Personal history of malignant neoplasm of breast: Secondary | ICD-10-CM | POA: Insufficient documentation

## 2012-09-17 DIAGNOSIS — J4489 Other specified chronic obstructive pulmonary disease: Secondary | ICD-10-CM | POA: Insufficient documentation

## 2012-09-17 DIAGNOSIS — J449 Chronic obstructive pulmonary disease, unspecified: Secondary | ICD-10-CM

## 2012-09-17 DIAGNOSIS — F17200 Nicotine dependence, unspecified, uncomplicated: Secondary | ICD-10-CM | POA: Insufficient documentation

## 2012-09-17 DIAGNOSIS — J45901 Unspecified asthma with (acute) exacerbation: Secondary | ICD-10-CM | POA: Insufficient documentation

## 2012-09-17 DIAGNOSIS — IMO0002 Reserved for concepts with insufficient information to code with codable children: Secondary | ICD-10-CM | POA: Insufficient documentation

## 2012-09-17 MED ORDER — ALBUTEROL SULFATE HFA 108 (90 BASE) MCG/ACT IN AERS
2.0000 | INHALATION_SPRAY | RESPIRATORY_TRACT | Status: DC | PRN
  Filled 2012-09-17: qty 6.7

## 2012-09-17 MED ORDER — PREDNISONE 20 MG PO TABS: 60.0000 mg | ORAL_TABLET | Freq: Every day | ORAL | Status: DC

## 2012-09-17 MED ORDER — ALBUTEROL SULFATE (5 MG/ML) 0.5% IN NEBU
5.0000 mg | INHALATION_SOLUTION | Freq: Once | RESPIRATORY_TRACT | Status: AC
  Administered 2012-09-17: 5 mg via RESPIRATORY_TRACT
  Filled 2012-09-17: qty 1

## 2012-09-17 MED ORDER — PREDNISONE 20 MG PO TABS
60.0000 mg | ORAL_TABLET | Freq: Once | ORAL | Status: AC
  Administered 2012-09-17: 60 mg via ORAL
  Filled 2012-09-17: qty 3

## 2012-09-17 MED ORDER — IPRATROPIUM BROMIDE 0.02 % IN SOLN
0.5000 mg | Freq: Once | RESPIRATORY_TRACT | Status: AC
  Administered 2012-09-17: 0.5 mg via RESPIRATORY_TRACT
  Filled 2012-09-17: qty 2.5

## 2012-09-17 NOTE — ED Provider Notes (Signed)
History     CSN: [id number]  Arrival date & time 09/17/12  1304   First MD Initiated Contact with Patient 09/17/12 1311      Chief Complaint  Patient presents with  . Shortness of Breath    (Consider location/radiation/quality/duration/timing/severity/associated sxs/prior treatment) HPI... patient has long-standing COPD/asthma. Complains of coughing and wheezing for several days. She continues to smoke cigarettes. Productive yellow sputum.  Severity is mild to moderate. No other associated symptoms  Past Medical History  Diagnosis Date  . Thyroid disease   . Seizures     epilepsy  . Chronic kidney disease   . Breast cancer 01/2012    right  . Arthritis   . Asthma     Past Surgical History  Procedure Date  . Knee surgery     right  . Tonsillectomy   . Ovarian cyst surgery   . Breast lumpectomy with needle localization and axillary sentinel lymph node bx 03/06/2012    Procedure: BREAST LUMPECTOMY WITH NEEDLE LOCALIZATION AND AXILLARY SENTINEL LYMPH NODE BX;  Surgeon: Clovis Pu. Cornett, MD;  Location: [facility];  Service: General;  Laterality: Right;  right breast needle localized lumpectomy and right sentinel lymph node mapping    Family History  Problem Relation Age of Onset  . Heart disease Mother   . Cancer Sister     cervical cancer  . Cancer Brother     colon  . Breast cancer Sister   . Cancer Sister     breast    History  Substance Use Topics  . Smoking status: Current Every Day Smoker -- 0.2 packs/day    Types: Cigarettes  . Smokeless tobacco: Never Used     Comment: less than .25 pack  . Alcohol Use: Yes    OB History    Grav Para Term Preterm Abortions TAB SAB Ect Mult Living                  Review of Systems  All other systems reviewed and are negative.    Allergies  Review of patient's allergies indicates no known allergies.  Home Medications   Current Outpatient Rx  Name  Route  Sig  Dispense  Refill  . ALBUTEROL  SULFATE HFA 108 (90 BASE) MCG/ACT IN AERS   Inhalation   Inhale 2 puffs into the lungs every 6 (six) hours as needed. Wheezing and shortness of breath         . DIVALPROEX SODIUM 500 MG PO TBEC   Oral   Take 500 mg by mouth 2 (two) times daily.         [name] Kitchen LEVOTHYROXINE SODIUM 75 MCG PO TABS   Oral   Take 1 tablet (75 mcg total) by mouth daily.   30 tablet   1   . PREDNISONE 20 MG PO TABS   Oral   Take 3 tablets (60 mg total) by mouth daily.   15 tablet   0     BP 131/91  Pulse 106  Temp 97.7 F (36.5 C) (Oral)  Resp 25  SpO2 96%  Physical Exam  Nursing note and vitals reviewed. Constitutional: She is oriented to person, place, and time. She appears well-developed and well-nourished.  HENT:  Head: Normocephalic and atraumatic.  Eyes: Conjunctivae normal and EOM are normal. Pupils are equal, round, and reactive to light.  Neck: Normal range of motion. Neck supple.  Cardiovascular: Normal rate, regular rhythm and normal heart sounds.   Pulmonary/Chest: Effort normal.  Bilateral expiratory wheeze  Abdominal: Soft. Bowel sounds are normal.  Musculoskeletal: Normal range of motion.  Neurological: She is alert and oriented to person, place, and time.  Skin: Skin is warm and dry.  Psychiatric: She has a normal mood and affect.    ED Course  Procedures (including critical care time)  Labs Reviewed - No data to display Dg Chest 2 View  09/17/2012  *RADIOLOGY REPORT*  Clinical Data: Cough for 1 month  CHEST - 2 VIEW  Comparison: 06/24/2012  Findings: The heart and pulmonary vascularity are within normal limits.  The lungs are hyperinflated but otherwise clear.  No acute bony abnormality is seen.  Postsurgical changes are noted in the right axilla. Nipple shadows are again noted.  IMPRESSION: No acute abnormality is noted.   Original Report Authenticated By: Alcide Clever, M.D.      1. COPD (chronic obstructive pulmonary disease)       MDM  Patient feeling  better after albuterol/Atrovent breathing treatment. Chest x-ray shows no acute changes.  Discharge home with inhaler and prednisone. Advised to stop smoking        Donnetta Hutching, MD 09/17/12 [phone]

## 2012-09-17 NOTE — ED Notes (Addendum)
Pt presents to ED with c/o shortness of breath that has been going on for awhile. Pt sts ever since she was here back in September she just feels worse. Pt sts can't sleep at night d/t SOB. Sts productive cough with yellow sputum. Pt also c/o fever and chills (not sure how high fever never checked it). Pt sts hx of asthma, she uses inhaler without relief. Pt is current smoker.

## 2012-10-13 ENCOUNTER — Emergency Department (HOSPITAL_COMMUNITY)
Admission: EM | Admit: 2012-10-13 | Discharge: 2012-10-13 | Disposition: A | Discharge status: Home / Self Care | Payer: Medicaid Other | Attending: Emergency Medicine | Admitting: Emergency Medicine

## 2012-10-13 ENCOUNTER — Encounter (HOSPITAL_COMMUNITY): Payer: Self-pay

## 2012-10-13 DIAGNOSIS — J40 Bronchitis, not specified as acute or chronic: Secondary | ICD-10-CM | POA: Insufficient documentation

## 2012-10-13 DIAGNOSIS — IMO0002 Reserved for concepts with insufficient information to code with codable children: Secondary | ICD-10-CM | POA: Insufficient documentation

## 2012-10-13 DIAGNOSIS — R05 Cough: Secondary | ICD-10-CM | POA: Insufficient documentation

## 2012-10-13 DIAGNOSIS — Z720 Tobacco use: Secondary | ICD-10-CM

## 2012-10-13 DIAGNOSIS — F17200 Nicotine dependence, unspecified, uncomplicated: Secondary | ICD-10-CM | POA: Insufficient documentation

## 2012-10-13 DIAGNOSIS — Z79899 Other long term (current) drug therapy: Secondary | ICD-10-CM | POA: Insufficient documentation

## 2012-10-13 DIAGNOSIS — R059 Cough, unspecified: Secondary | ICD-10-CM | POA: Insufficient documentation

## 2012-10-13 DIAGNOSIS — Z853 Personal history of malignant neoplasm of breast: Secondary | ICD-10-CM | POA: Insufficient documentation

## 2012-10-13 DIAGNOSIS — G40909 Epilepsy, unspecified, not intractable, without status epilepticus: Secondary | ICD-10-CM | POA: Insufficient documentation

## 2012-10-13 DIAGNOSIS — J45909 Unspecified asthma, uncomplicated: Secondary | ICD-10-CM | POA: Insufficient documentation

## 2012-10-13 DIAGNOSIS — Z8739 Personal history of other diseases of the musculoskeletal system and connective tissue: Secondary | ICD-10-CM | POA: Insufficient documentation

## 2012-10-13 DIAGNOSIS — E079 Disorder of thyroid, unspecified: Secondary | ICD-10-CM | POA: Insufficient documentation

## 2012-10-13 MED ORDER — PREDNISONE 20 MG PO TABS
60.0000 mg | ORAL_TABLET | Freq: Once | ORAL | Status: AC
  Administered 2012-10-13: 60 mg via ORAL
  Filled 2012-10-13: qty 3

## 2012-10-13 MED ORDER — PREDNISONE 20 MG PO TABS: 20.0000 mg | ORAL_TABLET | Freq: Two times a day (BID) | ORAL | Status: DC

## 2012-10-13 MED ORDER — LEVOTHYROXINE SODIUM 75 MCG PO TABS: 75.0000 ug | ORAL_TABLET | Freq: Every day | ORAL | Status: DC

## 2012-10-13 MED ORDER — ALBUTEROL SULFATE HFA 108 (90 BASE) MCG/ACT IN AERS
2.0000 | INHALATION_SPRAY | RESPIRATORY_TRACT | Status: DC | PRN
  Administered 2012-10-13: 2 via RESPIRATORY_TRACT
  Filled 2012-10-13: qty 6.7

## 2012-10-13 MED ORDER — AEROCHAMBER Z-STAT PLUS/MEDIUM MISC
1.0000 | Freq: Once | Status: AC
  Administered 2012-10-13: 1

## 2012-10-13 NOTE — ED Notes (Signed)
Patient reports that she ws placed on Prednisone and Albuterol inhaler and is now out of the meds. Patient states that when she ran out of prednisone her wheezing and SOB started back. Patient also reports that she is out of her thyroid medication and is now having slight neck swelling. Patient has wheezing left lobe

## 2012-10-13 NOTE — ED Provider Notes (Signed)
History     CSN: [id number]  Arrival date & time 10/13/12  1042   First MD Initiated Contact with Patient 10/13/12 1114      Chief Complaint  Patient presents with  . Shortness of Breath    (Consider location/radiation/quality/duration/timing/severity/associated sxs/prior treatment) HPI Comments: Courtney Terry is a 52 y.o. female who is here for evaluation of cough with shortness of breath. It worsened when she ran out of her inhaler. She was treated last month with an inhaler and prednisone with improvement. She is also out of her thyroid medication. She does not currently have a PCP, but is trying to get in to see Dr. Leretha Dykes. She denies fever, chills, nausea, vomiting, chest pain, weakness, or dizziness. There are no aggravating or palliative factors.  Patient is a 52 y.o. female presenting with shortness of breath. The history is provided by the patient.  Shortness of Breath  Associated symptoms include shortness of breath.    Past Medical History  Diagnosis Date  . Thyroid disease   . Seizures     epilepsy  . Breast cancer 01/2012    right  . Arthritis   . Asthma     Past Surgical History  Procedure Date  . Knee surgery     right  . Tonsillectomy   . Ovarian cyst surgery   . Breast lumpectomy with needle localization and axillary sentinel lymph node bx 03/06/2012    Procedure: BREAST LUMPECTOMY WITH NEEDLE LOCALIZATION AND AXILLARY SENTINEL LYMPH NODE BX;  Surgeon: Clovis Pu. Cornett, MD;  Location: [facility];  Service: General;  Laterality: Right;  right breast needle localized lumpectomy and right sentinel lymph node mapping    Family History  Problem Relation Age of Onset  . Heart disease Mother   . Cancer Sister     cervical cancer  . Cancer Brother     colon  . Breast cancer Sister   . Cancer Sister     breast    History  Substance Use Topics  . Smoking status: Current Some Day Smoker -- 0.2 packs/day    Types: Cigarettes  . Smokeless  tobacco: Never Used     Comment: less than .25 pack  . Alcohol Use: No    OB History    Grav Para Term Preterm Abortions TAB SAB Ect Mult Living                  Review of Systems  Respiratory: Positive for shortness of breath.   All other systems reviewed and are negative.    Allergies  Review of patient's allergies indicates no known allergies.  Home Medications   Current Outpatient Rx  Name  Route  Sig  Dispense  Refill  . ALBUTEROL SULFATE HFA 108 (90 BASE) MCG/ACT IN AERS   Inhalation   Inhale 2 puffs into the lungs every 6 (six) hours as needed. Wheezing and shortness of breath         . DIVALPROEX SODIUM 500 MG PO TBEC   Oral   Take 500 mg by mouth 2 (two) times daily.         [name] Kitchen LEVOTHYROXINE SODIUM 75 MCG PO TABS   Oral   Take 1 tablet (75 mcg total) by mouth daily.   30 tablet   1   . PREDNISONE 20 MG PO TABS   Oral   Take 1 tablet (20 mg total) by mouth 2 (two) times daily.   10 tablet   0  BP 128/77  Pulse 92  Temp 98.3 F (36.8 C)  Resp 15  SpO2 99%  Physical Exam  Nursing note and vitals reviewed. Constitutional: She is oriented to person, place, and time. She appears well-developed and well-nourished.  HENT:  Head: Normocephalic and atraumatic.  Eyes: Conjunctivae normal and EOM are normal. Pupils are equal, round, and reactive to light.  Neck: Normal range of motion and phonation normal. Neck supple.  Cardiovascular: Normal rate, regular rhythm and intact distal pulses.   Pulmonary/Chest: Effort normal. No respiratory distress. She has wheezes. She has no rales. She exhibits no tenderness.       Decreased breath sounds bilaterally, with scattered wheezes, no rhonchi  Abdominal: Soft. She exhibits no distension. There is no tenderness. There is no guarding.  Musculoskeletal: Normal range of motion.  Neurological: She is alert and oriented to person, place, and time. She has normal strength. She exhibits normal muscle tone.  Skin:  Skin is warm and dry.  Psychiatric: She has a normal mood and affect. Her behavior is normal. Judgment and thought content normal.    ED Course  Procedures (including critical care time)  ED Treatment: Albuterol inhaler, Prednisone    Date: 07/24/2012  Rate: 93  Rhythm: sinus arrhythmia, PVC  QRS Axis: normal  PR and QT Intervals: normal  ST/T Wave abnormalities: normal  PR and QRS Conduction Disutrbances:none  Narrative Interpretation:   Old EKG Reviewed: unchanged    Nursing notes, applicable records and vitals reviewed.  Radiologic Images/Reports reviewed.    1. Bronchitis   2. Tobacco abuse       MDM  Bronchitis. Rx for pneumonia, metabolic instability, or occult infection     Plan: Home Medications- prednisone, Synthroid, albuterol inhaler; Home Treatments- rest, stop smoking; Recommended follow up- PCP, as soon as possible     Flint Melter, MD 10/13/12 1814

## 2012-10-28 ENCOUNTER — Emergency Department (HOSPITAL_COMMUNITY)
Admission: EM | Admit: 2012-10-28 | Discharge: 2012-10-28 | Disposition: A | Discharge status: Home / Self Care | Payer: Medicaid Other | Attending: Emergency Medicine | Admitting: Emergency Medicine

## 2012-10-28 ENCOUNTER — Encounter (HOSPITAL_COMMUNITY): Payer: Self-pay | Admitting: *Deleted

## 2012-10-28 ENCOUNTER — Emergency Department (HOSPITAL_COMMUNITY): Payer: Medicaid Other

## 2012-10-28 DIAGNOSIS — Z853 Personal history of malignant neoplasm of breast: Secondary | ICD-10-CM | POA: Insufficient documentation

## 2012-10-28 DIAGNOSIS — F329 Major depressive disorder, single episode, unspecified: Secondary | ICD-10-CM | POA: Insufficient documentation

## 2012-10-28 DIAGNOSIS — Z79899 Other long term (current) drug therapy: Secondary | ICD-10-CM | POA: Insufficient documentation

## 2012-10-28 DIAGNOSIS — F3289 Other specified depressive episodes: Secondary | ICD-10-CM | POA: Insufficient documentation

## 2012-10-28 DIAGNOSIS — J45901 Unspecified asthma with (acute) exacerbation: Secondary | ICD-10-CM | POA: Insufficient documentation

## 2012-10-28 DIAGNOSIS — F32A Depression, unspecified: Secondary | ICD-10-CM

## 2012-10-28 DIAGNOSIS — F1410 Cocaine abuse, uncomplicated: Secondary | ICD-10-CM | POA: Insufficient documentation

## 2012-10-28 DIAGNOSIS — G40909 Epilepsy, unspecified, not intractable, without status epilepticus: Secondary | ICD-10-CM | POA: Insufficient documentation

## 2012-10-28 DIAGNOSIS — E079 Disorder of thyroid, unspecified: Secondary | ICD-10-CM | POA: Insufficient documentation

## 2012-10-28 DIAGNOSIS — J441 Chronic obstructive pulmonary disease with (acute) exacerbation: Secondary | ICD-10-CM | POA: Insufficient documentation

## 2012-10-28 DIAGNOSIS — IMO0002 Reserved for concepts with insufficient information to code with codable children: Secondary | ICD-10-CM | POA: Insufficient documentation

## 2012-10-28 DIAGNOSIS — Z8739 Personal history of other diseases of the musculoskeletal system and connective tissue: Secondary | ICD-10-CM | POA: Insufficient documentation

## 2012-10-28 DIAGNOSIS — F17200 Nicotine dependence, unspecified, uncomplicated: Secondary | ICD-10-CM | POA: Insufficient documentation

## 2012-10-28 LAB — BASIC METABOLIC PANEL
BUN: 16 mg/dL (ref 6–23)
Calcium: 9.4 mg/dL (ref 8.4–10.5)
Chloride: 107 mEq/L (ref 96–112)
Creatinine, Ser: 0.77 mg/dL (ref 0.50–1.10)
GFR calc Af Amer: >90 mL/min (ref >90)
GFR calc non Af Amer: >90 mL/min (ref >90)

## 2012-10-28 LAB — CBC WITH DIFFERENTIAL/PLATELET
Basophils Absolute: 0.1 10*3/uL (ref 0.0–0.1)
Basophils Relative: 1 % (ref 0–1)
Eosinophils Absolute: 0.3 10*3/uL (ref 0.0–0.7)
HCT: 46.9 % — ABNORMAL HIGH (ref 36.0–46.0)
MCH: 27 pg (ref 26.0–34.0)
MCHC: 31.8 g/dL (ref 30.0–36.0)
Monocytes Absolute: 0.7 10*3/uL (ref 0.1–1.0)
Neutro Abs: 5.6 10*3/uL (ref 1.7–7.7)
Neutrophils Relative %: 60 % (ref 43–77)
RDW: 16.1 % — ABNORMAL HIGH (ref 11.5–15.5)

## 2012-10-28 MED ORDER — ALBUTEROL SULFATE HFA 108 (90 BASE) MCG/ACT IN AERS
1.0000 | INHALATION_SPRAY | RESPIRATORY_TRACT | Status: DC | PRN
  Administered 2012-10-28: 1 via RESPIRATORY_TRACT
  Filled 2012-10-28: qty 6.7

## 2012-10-28 MED ORDER — METHYLPREDNISOLONE SODIUM SUCC 125 MG IJ SOLR
125.0000 mg | Freq: Once | INTRAMUSCULAR | Status: AC
  Administered 2012-10-28: 125 mg via INTRAVENOUS
  Filled 2012-10-28: qty 2

## 2012-10-28 MED ORDER — ALBUTEROL SULFATE (5 MG/ML) 0.5% IN NEBU
5.0000 mg | INHALATION_SOLUTION | Freq: Once | RESPIRATORY_TRACT | Status: AC
  Administered 2012-10-28: 5 mg via RESPIRATORY_TRACT
  Filled 2012-10-28: qty 1

## 2012-10-28 MED ORDER — PREDNISONE (PAK) 10 MG PO TABS: ORAL_TABLET | ORAL | Status: DC

## 2012-10-28 MED ORDER — IPRATROPIUM BROMIDE 0.02 % IN SOLN
0.5000 mg | Freq: Once | RESPIRATORY_TRACT | Status: AC
  Administered 2012-10-28: 0.5 mg via RESPIRATORY_TRACT
  Filled 2012-10-28: qty 2.5

## 2012-10-28 MED ORDER — ALBUTEROL SULFATE (5 MG/ML) 0.5% IN NEBU
10.0000 mg | INHALATION_SOLUTION | Freq: Once | RESPIRATORY_TRACT | Status: AC
  Administered 2012-10-28: 10 mg via RESPIRATORY_TRACT

## 2012-10-28 MED ORDER — MOMETASONE FURO-FORMOTEROL FUM 100-5 MCG/ACT IN AERO
2.0000 | INHALATION_SPRAY | Freq: Two times a day (BID) | RESPIRATORY_TRACT | Status: DC
  Administered 2012-10-28: 2 via RESPIRATORY_TRACT
  Filled 2012-10-28: qty 8.8

## 2012-10-28 NOTE — Progress Notes (Signed)
CSW received call from EDP regarding outpatient counseling and psychiatric resources.  CSW met with pt to assess patient current csw needs. Pt denies current SI/HI/AH/VH. Patient reports that when she can't breath, or feels limited by her illness she feels depressed. Pt expressed symptoms of depression including; fatigue, tearfulness, and irritability.   CSW and pt discussed outpatient resources. Pt thanked csw for concern and support. Pt plans to follow up and look into outpatient counselors and psychiatrists.   Catha Gosselin, LCSWA  [phone] 10/28/2012  1948pm

## 2012-10-28 NOTE — ED Provider Notes (Signed)
History     CSN: [id number] Arrival date & time 10/28/12  1646 First MD Initiated Contact with Patient 10/28/12 1732      Chief Complaint  Patient presents with  . Asthma  . Shortness of Breath   Patient is a 52 y.o. female presenting with asthma and shortness of breath. The history is provided by the patient.  Asthma This is a new problem. The current episode started more than 1 week ago. Associated symptoms include shortness of breath. Pertinent negatives include no chest pain and no abdominal pain. The symptoms are aggravated by walking. Relieved by: usually albuterol helps some but she ran out of them.  When she is on prednisone she feels much better as well. She has tried nothing for the symptoms. The treatment provided no relief.  Shortness of Breath  Associated symptoms include shortness of breath. Pertinent negatives include no chest pain and no fever. Her past medical history is significant for asthma.  Pt used to smoke a lot more.  She has cut down to three cigarette daily. Spoke with case Production designer, theatre/television/film.  She is concerned about some depression issues the patient is having.  Also is concerned about her breathing.  She wonders if she should be on oxygen or a nebulizer. Past Medical History  Diagnosis Date  . Thyroid disease   . Seizures     epilepsy  . Breast cancer 01/2012    right  . Arthritis   . Asthma     Past Surgical History  Procedure Date  . Knee surgery     right  . Tonsillectomy   . Ovarian cyst surgery   . Breast lumpectomy with needle localization and axillary sentinel lymph node bx 03/06/2012    Procedure: BREAST LUMPECTOMY WITH NEEDLE LOCALIZATION AND AXILLARY SENTINEL LYMPH NODE BX;  Surgeon: Clovis Pu. Cornett, MD;  Location: [facility];  Service: General;  Laterality: Right;  right breast needle localized lumpectomy and right sentinel lymph node mapping    Family History  Problem Relation Age of Onset  . Heart disease Mother   . Cancer Sister      cervical cancer  . Cancer Brother     colon  . Breast cancer Sister   . Cancer Sister     breast    History  Substance Use Topics  . Smoking status: Current Some Day Smoker -- 0.2 packs/day    Types: Cigarettes  . Smokeless tobacco: Never Used     Comment: less than .25 pack  . Alcohol Use: No    OB History    Grav Para Term Preterm Abortions TAB SAB Ect Mult Living                  Review of Systems  Constitutional: Negative for fever.  Respiratory: Positive for shortness of breath.   Cardiovascular: Negative for chest pain.  Gastrointestinal: Negative for abdominal pain.  Psychiatric/Behavioral:       She does get depressed because she cannot breathe well, no specific si or hi plan     Allergies  Review of patient's allergies indicates no known allergies.  Home Medications   Current Outpatient Rx  Name  Route  Sig  Dispense  Refill  . ALBUTEROL SULFATE HFA 108 (90 BASE) MCG/ACT IN AERS   Inhalation   Inhale 2 puffs into the lungs every 6 (six) hours as needed. Wheezing and shortness of breath         . DIVALPROEX SODIUM 500 MG  PO TBEC   Oral   Take 500 mg by mouth 2 (two) times daily.         [name] Kitchen LEVOTHYROXINE SODIUM 75 MCG PO TABS   Oral   Take 75 mcg by mouth daily.         [name] Kitchen PREDNISONE 20 MG PO TABS   Oral   Take 20 mg by mouth 2 (two) times daily.           BP 115/89  Pulse 90  Temp 98.5 F (36.9 C) (Oral)  Resp 22  SpO2 96%  Physical Exam  Nursing note and vitals reviewed. Constitutional: She appears well-developed and well-nourished. No distress.  HENT:  Head: Normocephalic and atraumatic.  Right Ear: External ear normal.  Left Ear: External ear normal.  Eyes: Conjunctivae normal are normal. Right eye exhibits no discharge. Left eye exhibits no discharge. No scleral icterus.  Neck: Neck supple. No tracheal deviation present.  Cardiovascular: Normal rate, regular rhythm and intact distal pulses.   Pulmonary/Chest: Accessory  muscle usage present. No stridor. Tachypnea noted. No respiratory distress. She has decreased breath sounds. She has wheezes. She has no rales.  Abdominal: Soft. Bowel sounds are normal. She exhibits no distension. There is no tenderness. There is no rebound and no guarding.  Musculoskeletal: She exhibits no edema and no tenderness.  Neurological: She is alert. She has normal strength. No sensory deficit. Cranial nerve deficit:  no gross defecits noted. She exhibits normal muscle tone. She displays no seizure activity. Coordination normal.  Skin: Skin is warm and dry. No rash noted.  Psychiatric: Her speech is not delayed and not tangential. Thought content is not delusional. She exhibits a depressed mood. She expresses no homicidal and no suicidal ideation.    ED Course  Procedures (including critical care time)  Labs Reviewed  CBC WITH DIFFERENTIAL - Abnormal; Notable for the following:    RBC 5.52 (*)     HCT 46.9 (*)     RDW 16.1 (*)     All other components within normal limits  BASIC METABOLIC PANEL   Dg Chest 2 View  10/28/2012  *RADIOLOGY REPORT*  Clinical Data: Shortness of breath.  History of asthma.  CHEST - 2 VIEW  Comparison: PA and lateral chest 09/17/2012.  Findings: The chest is markedly hyperexpanded. A 1.1 cm nodular opacity is identified in the left lung apex. No consolidative process is identified.  Multiple surgical clips in the right breast are identified.  No pneumothorax or pleural fluid.  Heart size normal.  IMPRESSION:  1.  1.1 cm nodular opacity in the left lung apex is not definitely seen on prior studies.  Chest CT with contrast recommend for further evaluation. 2.  Bilateral pulmonary hyperexpansion compatible emphysema.   Original Report Authenticated By: Holley Dexter, M.D.      1. COPD exacerbation   2. Depression       MDM  Asthma exacerbation The patient improved significantly with treatment in the emergency department. On repeat exam she is no  longer wheezing. She's not using accessory muscles. She is able to speak in full sentences The patient does not have a primary doctor. I spoke with her case manager, who is trying to establish care with a primary care Dr. I will give the patient a 2 layer inhaler in addition to an albuterol inhaler. I think she would benefit from a maintenance inhaler as opposed to just rescue albuterol. She continues to try to quit smoking.  Depression The act  team spoke the patient while she was here in the emergency room. She was given resources in order to help her with her depression. She is not suicidal or homicidal. She is stable for discharge.  Patient is smiling and is comfortable with the discharge plan. She is feeling better after our treatment in the emergency department.        Celene Kras, MD 10/28/12 2022

## 2012-10-28 NOTE — ED Notes (Signed)
Pt completed breathing treatment, in no acute distress, requesting something to eat.

## 2012-10-28 NOTE — ED Notes (Signed)
Pt given a sandwich

## 2012-10-28 NOTE — ED Notes (Signed)
Pt states she's having an asthma flare up, started a week ago, states is out of her prednisone and inhaler at home. Pt is short of breath.

## 2012-11-10 ENCOUNTER — Encounter (INDEPENDENT_AMBULATORY_CARE_PROVIDER_SITE_OTHER): Payer: Self-pay | Admitting: Surgery

## 2012-11-25 ENCOUNTER — Institutional Professional Consult (permissible substitution): Payer: Medicaid Other | Admitting: Internal Medicine

## 2012-12-04 ENCOUNTER — Institutional Professional Consult (permissible substitution): Payer: Medicaid Other | Admitting: Internal Medicine

## 2012-12-25 ENCOUNTER — Institutional Professional Consult (permissible substitution): Payer: Medicaid Other | Admitting: Internal Medicine

## 2013-01-06 ENCOUNTER — Institutional Professional Consult (permissible substitution): Payer: Medicaid Other | Admitting: Internal Medicine

## 2013-01-19 ENCOUNTER — Institutional Professional Consult (permissible substitution): Payer: Medicaid Other | Admitting: Internal Medicine

## 2013-01-27 ENCOUNTER — Institutional Professional Consult (permissible substitution): Payer: Medicaid Other | Admitting: Internal Medicine

## 2013-02-03 ENCOUNTER — Emergency Department (HOSPITAL_COMMUNITY)
Admission: EM | Admit: 2013-02-03 | Discharge: 2013-02-03 | Disposition: A | Discharge status: Home / Self Care | Payer: Medicaid Other | Attending: Emergency Medicine | Admitting: Emergency Medicine

## 2013-02-03 ENCOUNTER — Encounter (HOSPITAL_COMMUNITY): Payer: Self-pay | Admitting: Nurse Practitioner

## 2013-02-03 ENCOUNTER — Emergency Department (HOSPITAL_COMMUNITY): Payer: Medicaid Other

## 2013-02-03 DIAGNOSIS — R059 Cough, unspecified: Secondary | ICD-10-CM | POA: Insufficient documentation

## 2013-02-03 DIAGNOSIS — R062 Wheezing: Secondary | ICD-10-CM

## 2013-02-03 DIAGNOSIS — Z8739 Personal history of other diseases of the musculoskeletal system and connective tissue: Secondary | ICD-10-CM | POA: Insufficient documentation

## 2013-02-03 DIAGNOSIS — F17200 Nicotine dependence, unspecified, uncomplicated: Secondary | ICD-10-CM | POA: Insufficient documentation

## 2013-02-03 DIAGNOSIS — Z79899 Other long term (current) drug therapy: Secondary | ICD-10-CM | POA: Insufficient documentation

## 2013-02-03 DIAGNOSIS — J441 Chronic obstructive pulmonary disease with (acute) exacerbation: Secondary | ICD-10-CM | POA: Insufficient documentation

## 2013-02-03 DIAGNOSIS — E079 Disorder of thyroid, unspecified: Secondary | ICD-10-CM | POA: Insufficient documentation

## 2013-02-03 DIAGNOSIS — J449 Chronic obstructive pulmonary disease, unspecified: Secondary | ICD-10-CM

## 2013-02-03 DIAGNOSIS — R05 Cough: Secondary | ICD-10-CM | POA: Insufficient documentation

## 2013-02-03 DIAGNOSIS — G40909 Epilepsy, unspecified, not intractable, without status epilepticus: Secondary | ICD-10-CM | POA: Insufficient documentation

## 2013-02-03 DIAGNOSIS — Z853 Personal history of malignant neoplasm of breast: Secondary | ICD-10-CM | POA: Insufficient documentation

## 2013-02-03 DIAGNOSIS — IMO0002 Reserved for concepts with insufficient information to code with codable children: Secondary | ICD-10-CM | POA: Insufficient documentation

## 2013-02-03 LAB — BASIC METABOLIC PANEL
BUN: 16 mg/dL (ref 6–23)
CO2: 27 mEq/L (ref 19–32)
Chloride: 104 mEq/L (ref 96–112)
Creatinine, Ser: 0.66 mg/dL (ref 0.50–1.10)
GFR calc Af Amer: >90 mL/min (ref >90)
Potassium: 3.9 mEq/L (ref 3.5–5.1)

## 2013-02-03 LAB — CBC
HCT: 41.3 % (ref 36.0–46.0)
MCV: 82.9 fL (ref 78.0–100.0)
Platelets: 243 10*3/uL (ref 150–400)
RBC: 4.98 MIL/uL (ref 3.87–5.11)
RDW: 15.9 % — ABNORMAL HIGH (ref 11.5–15.5)
WBC: 13.2 10*3/uL — ABNORMAL HIGH (ref 4.0–10.5)

## 2013-02-03 MED ORDER — ALBUTEROL SULFATE (5 MG/ML) 0.5% IN NEBU
5.0000 mg | INHALATION_SOLUTION | Freq: Once | RESPIRATORY_TRACT | Status: AC
  Administered 2013-02-03: 5 mg via RESPIRATORY_TRACT
  Filled 2013-02-03: qty 0.5

## 2013-02-03 MED ORDER — ALBUTEROL SULFATE (5 MG/ML) 0.5% IN NEBU: 2.5000 mg | INHALATION_SOLUTION | Freq: Four times a day (QID) | RESPIRATORY_TRACT | Status: DC

## 2013-02-03 MED ORDER — ALBUTEROL (5 MG/ML) CONTINUOUS INHALATION SOLN
10.0000 mg/h | INHALATION_SOLUTION | RESPIRATORY_TRACT | Status: DC
  Administered 2013-02-03: 10 mg/h via RESPIRATORY_TRACT
  Filled 2013-02-03: qty 20

## 2013-02-03 MED ORDER — LORAZEPAM 1 MG PO TABS
1.0000 mg | ORAL_TABLET | Freq: Once | ORAL | Status: AC
  Administered 2013-02-03: 1 mg via ORAL
  Filled 2013-02-03: qty 1

## 2013-02-03 MED ORDER — IPRATROPIUM BROMIDE 0.02 % IN SOLN
0.5000 mg | Freq: Once | RESPIRATORY_TRACT | Status: AC
  Administered 2013-02-03: 0.5 mg via RESPIRATORY_TRACT
  Filled 2013-02-03: qty 2.5

## 2013-02-03 MED ORDER — ALBUTEROL SULFATE HFA 108 (90 BASE) MCG/ACT IN AERS
2.0000 | INHALATION_SPRAY | RESPIRATORY_TRACT | Status: DC | PRN
  Filled 2013-02-03: qty 6.7

## 2013-02-03 MED ORDER — ALBUTEROL SULFATE (5 MG/ML) 0.5% IN NEBU
5.0000 mg | INHALATION_SOLUTION | Freq: Once | RESPIRATORY_TRACT | Status: AC
  Administered 2013-02-03: 5 mg via RESPIRATORY_TRACT
  Filled 2013-02-03: qty 1

## 2013-02-03 MED ORDER — PREDNISONE 20 MG PO TABS: 60.0000 mg | ORAL_TABLET | Freq: Every day | ORAL | Status: DC

## 2013-02-03 MED ORDER — METHYLPREDNISOLONE SODIUM SUCC 125 MG IJ SOLR
125.0000 mg | Freq: Once | INTRAMUSCULAR | Status: AC
  Administered 2013-02-03: 125 mg via INTRAVENOUS
  Filled 2013-02-03: qty 2

## 2013-02-03 MED ORDER — ALBUTEROL SULFATE HFA 108 (90 BASE) MCG/ACT IN AERS: 2.0000 | INHALATION_SPRAY | RESPIRATORY_TRACT | Status: DC | PRN

## 2013-02-03 MED ORDER — IPRATROPIUM BROMIDE 0.02 % IN SOLN: 0.5000 mg | Freq: Four times a day (QID) | RESPIRATORY_TRACT | Status: DC

## 2013-02-03 MED ORDER — ALBUTEROL SULFATE (5 MG/ML) 0.5% IN NEBU
2.5000 mg | INHALATION_SOLUTION | Freq: Four times a day (QID) | RESPIRATORY_TRACT | Status: DC
  Administered 2013-02-03: 2.5 mg via RESPIRATORY_TRACT
  Filled 2013-02-03: qty 0.5

## 2013-02-03 NOTE — ED Notes (Addendum)
Per EMS:  SOB began around 2AM; pt attempted albuterol combivent inhaler that did not bring relief.  Pt using accessory muscles to breath. Wheezing auscultated in upper lobes and diminished lower lobes.  Total of 10mg  of Albuterol and 0.5mg  of Atrovent and 125mg  of Solumedrol.Pt is a smoker. Denies chest pain, n/v/d Pt in NSR per EMS- monitor, not 12 lead. 24g in left hand.    Meds taken at home per EMS: prednisone, albuterol, synthroid.

## 2013-02-03 NOTE — ED Notes (Signed)
RT at bedside.

## 2013-02-03 NOTE — ED Notes (Signed)
Patient given sandwich and soda, Dr Denton Lank approved.

## 2013-02-03 NOTE — ED Notes (Signed)
Albuterol inhaler given to pt by RT; pt verbalized how to use inhaler.

## 2013-02-03 NOTE — ED Provider Notes (Addendum)
History     CSN: [id number]  Arrival date & time 02/03/13  0740   First MD Initiated Contact with Patient 02/03/13 [phone]      Chief Complaint  Patient presents with  . Shortness of Breath    (Consider location/radiation/quality/duration/timing/severity/associated sxs/prior treatment) Patient is a 52 y.o. female presenting with shortness of breath. The history is provided by the patient and the EMS personnel.  Shortness of Breath Associated symptoms: cough and wheezing   Associated symptoms: no abdominal pain, no chest pain, no fever, no headaches, no neck pain and no rash   pt w hx copd, c/o wheezing and sob. States breathing started to get worse last week, slowly worse since. Uses inhaler at home prn. Only transient relief w albuterol use. w copd, denies prior hx admission. Last on prednisone approximately 3 months ago.  Non productive cough x past few days. No fever or chills. No chest pain. +smoker.      Past Medical History  Diagnosis Date  . Thyroid disease   . Seizures     epilepsy  . Breast cancer 01/2012    right  . Arthritis   . Asthma     Past Surgical History  Procedure Laterality Date  . Knee surgery      right  . Tonsillectomy    . Ovarian cyst surgery    . Breast lumpectomy with needle localization and axillary sentinel lymph node bx  03/06/2012    Procedure: BREAST LUMPECTOMY WITH NEEDLE LOCALIZATION AND AXILLARY SENTINEL LYMPH NODE BX;  Surgeon: Clovis Pu. Cornett, MD;  Location: [facility];  Service: General;  Laterality: Right;  right breast needle localized lumpectomy and right sentinel lymph node mapping    Family History  Problem Relation Age of Onset  . Heart disease Mother   . Cancer Sister     cervical cancer  . Cancer Brother     colon  . Breast cancer Sister   . Cancer Sister     breast    History  Substance Use Topics  . Smoking status: Current Some Day Smoker -- 0.25 packs/day    Types: Cigarettes  . Smokeless  tobacco: Never Used     Comment: less than .25 pack  . Alcohol Use: No    OB History   Grav Para Term Preterm Abortions TAB SAB Ect Mult Living                  Review of Systems  Constitutional: Negative for fever and chills.  HENT: Negative for neck pain.   Eyes: Negative for redness.  Respiratory: Positive for cough, shortness of breath and wheezing.   Cardiovascular: Negative for chest pain and leg swelling.  Gastrointestinal: Negative for abdominal pain.  Genitourinary: Negative for flank pain.  Musculoskeletal: Negative for back pain.  Skin: Negative for rash.  Neurological: Negative for headaches.  Hematological: Does not bruise/bleed easily.  Psychiatric/Behavioral: Negative for confusion.    Allergies  Review of patient's allergies indicates no known allergies.  Home Medications   Current Outpatient Rx  Name  Route  Sig  Dispense  Refill  . albuterol (PROVENTIL HFA;VENTOLIN HFA) 108 (90 BASE) MCG/ACT inhaler   Inhalation   Inhale 2 puffs into the lungs every 6 (six) hours as needed. Wheezing and shortness of breath         . divalproex (DEPAKOTE) 500 MG DR tablet   Oral   Take 500 mg by mouth 2 (two) times daily.         [name] Kitchen  levothyroxine (SYNTHROID, LEVOTHROID) 75 MCG tablet   Oral   Take 75 mcg by mouth daily.         . predniSONE (STERAPRED UNI-PAK) 10 MG tablet      Take 6 tabs by mouth daily  for 2 days, then 5 tabs for 2 days, then 4 tabs for 2 days, then 3 tabs for 2 days, 2 tabs for 2 days, then 1 tab by mouth daily for 2 days   42 tablet   0     SpO2 97%  Physical Exam  Nursing note and vitals reviewed. Constitutional: She is oriented to person, place, and time. She appears well-developed and well-nourished.  HENT:  Mouth/Throat: Oropharynx is clear and moist.  Eyes: Conjunctivae are normal. No scleral icterus.  Neck: Neck supple. No JVD present. No tracheal deviation present.  Cardiovascular: Normal rate, regular rhythm, normal  heart sounds and intact distal pulses.   Pulmonary/Chest: No respiratory distress. She has wheezes.  Wheezing, dec air movement bil.   Abdominal: Soft. Normal appearance. She exhibits no distension. There is no tenderness.  Musculoskeletal: She exhibits no edema and no tenderness.  Neurological: She is alert and oriented to person, place, and time.  Skin: Skin is warm and dry. No rash noted. She is not diaphoretic.  Psychiatric: She has a normal mood and affect.    ED Course  Procedures (including critical care time)  Results for orders placed during the hospital encounter of 02/03/13  CBC      Result Value Range   WBC 13.2 (*) 4.0 - 10.5 K/uL   RBC 4.98  3.87 - 5.11 MIL/uL   Hemoglobin 13.6  12.0 - 15.0 g/dL   HCT 11.9  14.7 - 82.9 %   MCV 82.9  78.0 - 100.0 fL   MCH 27.3  26.0 - 34.0 pg   MCHC 32.9  30.0 - 36.0 g/dL   RDW 56.2 (*) 13.0 - 86.5 %   Platelets 243  150 - 400 K/uL  BASIC METABOLIC PANEL      Result Value Range   Sodium 140  135 - 145 mEq/L   Potassium 3.9  3.5 - 5.1 mEq/L   Chloride 104  96 - 112 mEq/L   CO2 27  19 - 32 mEq/L   Glucose, Bld 117 (*) 70 - 99 mg/dL   BUN 16  6 - 23 mg/dL   Creatinine, Ser 7.84  0.50 - 1.10 mg/dL   Calcium 8.8  8.4 - 69.6 mg/dL   GFR calc non Af Amer >90  >90 mL/min   GFR calc Af Amer >90  >90 mL/min   Dg Chest Port 1 View  02/03/2013  *RADIOLOGY REPORT*  Clinical Data: Shortness of breath.  Chest pain.  History of asthma.  Breast cancer.  PORTABLE CHEST - 1 VIEW  Comparison: 10/28/2012.  Findings: Hyperexpanded lungs may represent changes of COPD/asthma. No infiltrate, congestive heart failure or pneumothorax.  Central pulmonary vascular prominence.  Previously questioned nodular density left upper lung zone may represent anterior aspect of the left first rib. The patient would eventually benefit from follow-up two-view chest with cardiac leads removed.  Prior right axillary lymph node dissection.  Heart size within normal limits.   IMPRESSION: Hyperexpanded lungs may represent changes of COPD/asthma. No infiltrate, congestive heart failure or pneumothorax.  Central pulmonary vascular prominence.  Previously questioned nodular density left upper lung zone may represent anterior aspect of the left first rib. The patient would eventually benefit from follow-up two-view  chest with cardiac leads removed.   Original Report Authenticated By: Lacy Duverney, M.D.        MDM  Albuterol and atrovent neb. Persistent wheezing.   Albuterol and atrovent neb. Solumedrol iv.   Adult wheeze protocol.  Cxr.  Reviewed nursing notes and prior charts for additional history.   Recheck several times post nebs, improved air exchange, pt feeling improved gradually.  Pt requesting food, drink, and med for anxiety, feels shaky.  Pt ate/drank well.  Ativan 1 mg po.  Recheck post last neb, breathing comfortably, pulse ox 96% room air. No increased wob. Much improved air exchange.  Pt states feels ready for d/c. Pt does request albut mdi for home.   Will also give referral to pulmonary follow up.   rx pred for home for the next 5 days.   Discussed smoking cessation.   At d/c hr 96, rr 16, pulse ox 96%. Pt appears stable for d/c.         Suzi Roots, MD 02/03/13 1455

## 2013-02-03 NOTE — Discharge Instructions (Signed)
Take prednisone as prescribed. Use albuterol inhaler 2 puffs every 4 hours as need.  No smoking.  If allergy symptoms, try claritin or zyrtec as need.  Follow up with primary care doctor in the next 1-2 days for recheck.  For copd/lung disease, follow up with pulmonary specialist in 1 week - see referral - call to arrange appointment. Return to ER right away if worse, difficulty breathing, chest pain, fevers, other concern.  While in the ER you were given medication that can cause drowsiness, no driving for the next 4 hours.    Chronic Obstructive Pulmonary Disease Chronic obstructive pulmonary disease (COPD) is a condition in which airflow from the lungs is restricted. The lungs can never return to normal, but there are measures you can take which will improve them and make you feel better. CAUSES   Smoking.  Exposure to secondhand smoke.  Breathing in irritants (pollution, cigarette smoke, strong smells, aerosol sprays, paint fumes).  History of lung infections. TREATMENT  Treatment focuses on making you comfortable (supportive care). Your caregiver may prescribe medications (inhaled or pills) to help improve your breathing. HOME CARE INSTRUCTIONS   If you smoke, stop smoking.  Avoid exposure to smoke, chemicals, and fumes that aggravate your breathing.  Take antibiotic medicines as directed by your caregiver.  Avoid medicines that dry up your system and slow down the elimination of secretions (antihistamines and cough syrups). This decreases respiratory capacity and may lead to infections.  Drink enough water and fluids to keep your urine clear or pale yellow. This loosens secretions.  Use humidifiers at home and at your bedside if they do not make breathing difficult.  Receive all protective vaccines your caregiver suggests, especially pneumococcal and influenza.  Use home oxygen as suggested.  Stay active. Exercise and physical activity will help maintain your ability  to do things you want to do.  Eat a healthy diet. SEEK MEDICAL CARE IF:   You develop pus-like mucus (sputum).  Breathing is more labored or exercise becomes difficult to do.  You are running out of the medicine you take for your breathing. SEEK IMMEDIATE MEDICAL CARE IF:   You have a rapid heart rate.  You have agitation, confusion, tremors, or are in a stupor (family members may need to observe this).  It becomes difficult to breathe.  You develop chest pain.  You have a fever. MAKE SURE YOU:   Understand these instructions.  Will watch your condition.  Will get help right away if you are not doing well or get worse. Document Released: 07/03/2005 Document Revised: 12/16/2011 Document Reviewed: 11/23/2010 [facility] Patient Information 2013 [place], [place].     Smoking Hazards Smoking cigarettes is extremely bad for your health. Tobacco smoke has over 200 known poisons in it. There are over 60 chemicals in tobacco smoke that cause cancer. Some of the chemicals found in cigarette smoke include:   Cyanide.  Benzene.  Formaldehyde.  Methanol (wood alcohol).  Acetylene (fuel used in welding torches).  Ammonia. Cigarette smoke also contains the poisonous gases nitrogen oxide and carbon monoxide.  Cigarette smokers have an increased risk of many serious medical problems, including:  Lung cancer.  Lung disease (such as pneumonia, bronchitis, and emphysema).  Heart attack and chest pain due to the heart not getting enough oxygen (angina).  Heart disease and peripheral blood vessel disease.  Hypertension.  Stroke.  Oral cancer (cancer of the lip, mouth, or voice box).  Bladder cancer.  Pancreatic cancer.  Cervical cancer.  Pregnancy  complications, including premature birth.  Low birthweight babies.  Early menopause.  Lower estrogen level for women.  Infertility.  Facial wrinkles.  Blindness.  Increased risk of broken bones  (fractures).  Senile dementia.  Stillbirths and smaller newborn babies, birth defects, and genetic damage to sperm.  Stomach ulcers and internal bleeding. Children of smokers have an increased risk of the following, because of secondhand smoke exposure:   Sudden infant death syndrome (SIDS).  Respiratory infections.  Lung cancer.  Heart disease.  Ear infections. Smoking causes approximately:  90% of all lung cancer deaths in men.  80% of all lung cancer deaths in women.  90% of deaths from chronic obstructive lung disease. Compared with nonsmokers, smoking increases the risk of:  Coronary heart disease by 2 to 4 times.  Stroke by 2 to 4 times.  Men developing lung cancer by 23 times.  Women developing lung cancer by 13 times.  Dying from chronic obstructive lung diseases by 12 times. Someone who smokes 2 packs a day loses about 8 years of his or her life. Even smoking lightly shortens your life expectancy by several years. You can greatly reduce the risk of medical problems for you and your family by stopping now. Smoking is the most preventable cause of death and disease in our society. Within days of quitting smoking, your circulation returns to normal, you decrease the risk of having a heart attack, and your lung capacity improves. There may be some increased phlegm in the first few days after quitting, and it may take months for your lungs to clear up completely. Quitting for 10 years cuts your lung cancer risk to almost that of a nonsmoker. WHY IS SMOKING ADDICTIVE?  Nicotine is the chemical agent in tobacco that is capable of causing addiction or dependence.  When you smoke and inhale, nicotine is absorbed rapidly into the bloodstream through your lungs. Nicotine absorbed through the lungs is capable of creating a powerful addiction. Both inhaled and non-inhaled nicotine may be addictive.  Addiction studies of cigarettes and spit tobacco show that addiction to  nicotine occurs mainly during the teen years, when young people begin using tobacco products. WHAT ARE THE BENEFITS OF QUITTING?  There are many health benefits to quitting smoking.   Likelihood of developing cancer and heart disease decreases. Health improvements are seen almost immediately.  Blood pressure, pulse rate, and breathing patterns start returning to normal soon after quitting.  People who quit may see an improvement in their overall quality of life. Some people choose to quit all at once. Other options include nicotine replacement products, such as patches, gum, and nasal sprays. Do not use these products without first checking with your caregiver. QUITTING SMOKING It is not easy to quit smoking. Nicotine is addicting, and longtime habits are hard to change. To start, you can write down all your reasons for quitting, tell your family and friends you want to quit, and ask for their help. Throw your cigarettes away, chew gum or cinnamon sticks, keep your hands busy, and drink extra water or juice. Go for walks and practice deep breathing to relax. Think of all the money you are saving: around $1,000 a year, for the average pack-a-day smoker. Nicotine patches and gum have been shown to improve success at efforts to stop smoking. Zyban (bupropion) is an anti-depressant drug that can be prescribed to reduce nicotine withdrawal symptoms and to suppress the urge to smoke. Smoking is an addiction with both physical and psychological effects. Joining  a stop-smoking support group can help you cope with the emotional issues. For more information and advice on programs to stop smoking, call your doctor, your local hospital, or these organizations:  American Lung Association - 1-800-LUNGUSA  American Cancer Society - 1-800-ACS-2345 Document Released: 10/31/2004 Document Revised: 12/16/2011 Document Reviewed: 07/05/2009 [facility] Patient Information 2013 [place], [place].

## 2013-02-03 NOTE — ED Notes (Signed)
[id number]> Expected date:<BR> Expected time:<BR> Means of arrival:<BR> Comments:<BR> SOB

## 2013-02-04 ENCOUNTER — Institutional Professional Consult (permissible substitution): Payer: Medicaid Other | Admitting: Internal Medicine

## 2013-02-12 ENCOUNTER — Institutional Professional Consult (permissible substitution): Payer: Medicaid Other | Admitting: Pulmonary Disease

## 2013-02-15 ENCOUNTER — Ambulatory Visit (INDEPENDENT_AMBULATORY_CARE_PROVIDER_SITE_OTHER): Payer: Medicaid Other | Admitting: Surgery

## 2013-02-16 ENCOUNTER — Institutional Professional Consult (permissible substitution): Payer: Medicaid Other | Admitting: Internal Medicine

## 2013-02-17 ENCOUNTER — Institutional Professional Consult (permissible substitution): Payer: Medicaid Other | Admitting: Internal Medicine

## 2013-02-22 ENCOUNTER — Institutional Professional Consult (permissible substitution): Payer: Medicaid Other | Admitting: Pulmonary Disease

## 2013-02-26 ENCOUNTER — Encounter: Payer: Self-pay | Admitting: Internal Medicine

## 2013-02-26 ENCOUNTER — Ambulatory Visit (INDEPENDENT_AMBULATORY_CARE_PROVIDER_SITE_OTHER): Payer: Medicaid Other | Admitting: Internal Medicine

## 2013-02-26 VITALS — BP 140/90 | HR 108 | Temp 98.6°F | Ht 70.0 in | Wt 139.4 lb

## 2013-02-26 DIAGNOSIS — F17200 Nicotine dependence, unspecified, uncomplicated: Secondary | ICD-10-CM

## 2013-02-26 DIAGNOSIS — R0609 Other forms of dyspnea: Secondary | ICD-10-CM

## 2013-02-26 DIAGNOSIS — J449 Chronic obstructive pulmonary disease, unspecified: Secondary | ICD-10-CM

## 2013-02-26 DIAGNOSIS — R0600 Dyspnea, unspecified: Secondary | ICD-10-CM

## 2013-02-26 MED ORDER — PREDNISONE (PAK) 10 MG PO TABS: ORAL_TABLET | ORAL | Status: DC

## 2013-02-26 MED ORDER — MOMETASONE FURO-FORMOTEROL FUM 200-5 MCG/ACT IN AERO: INHALATION_SPRAY | RESPIRATORY_TRACT | Status: DC

## 2013-02-26 NOTE — Patient Instructions (Signed)
The key is to stop smoking completely before smoking completely stops you!   Prednisone 10 mg take  4 each am x 2 days,   2 each am x 2 days,  1 each am x2days and stop   Plan A= automatic = dulera 200 Take 2 puffs first thing in am and then another 2 puffs about 12 hours later.    Plan B = Backup = Only use your albuterol (red inhaler = proaire) as a rescue medication to be used if you can't catch your breath by resting or doing a relaxed purse lip breathing pattern. The less you use it, the better it will work when you need it.  Ok to use it up to 2 puffs every 4 hours but goal is not to need it at all.  Please schedule a follow up office visit in 4 weeks, sooner if needed with pft's

## 2013-02-26 NOTE — Progress Notes (Signed)
Subjective:     Patient ID: Courtney Terry, female   DOB: 1961/01/09 MRN: [id number]  HPI 27 yobf smoker with new sob 2014 referred to pulmonary clinic 02/26/2013 for ? Copd by [facility]/hospitalists. Sees NP  Faylene Million.  02/26/2013 1st pulmonary ov/ Erabella Kuipers Chief Complaint  Patient presents with  . Acute Visit    Pt c/o increased SOB for the past wk. She states gets SOB just walking short distances such as from lobby to exam room today and also sometimes gets SOB at rest. She has also noticed increased cough and wheezing. Cough is prod with moderate yellow to clear sputum.   indolent onset, variable sob activity and at rest x 3 months better on prednisone with apparent over use of saba at baseline with more day than night time symptoms. Transiently better p saba  No obvious pattern to daytime variabilty or assoc   cp or chest tightness, subjective wheeze overt sinus or hb symptoms. No unusual exp hx or h/o childhood pna/ asthma or premature birth to her knowledge.     Also denies any obvious fluctuation of symptoms with weather or environmental changes or other aggravating or alleviating factors except as outlined above   Current Medications, Allergies, Past Medical History, Past Surgical History, Family History, and Social History were reviewed in [employer] record.  ROS  The following are not active complaints unless bolded sore throat, dysphagia, dental problems, itching, sneezing,  nasal congestion or excess/ purulent secretions, ear ache,   fever, chills, sweats, unintended wt loss, pleuritic or exertional cp, hemoptysis,  orthopnea pnd or leg swelling, presyncope, palpitations, heartburn, abdominal pain, anorexia, nausea, vomiting, diarrhea  or change in bowel or urinary habits, change in stools or urine, dysuria,hematuria,  rash, arthralgias, visual complaints, headache, numbness weakness or ataxia or problems with walking or coordination,  change in mood/affect or memory.     Review of Systems     Objective:   Physical Exam     Wt Readings from Last 3 Encounters:  02/26/13 139 lb 6.4 oz (63.231 kg)  06/23/12 149 lb (67.586 kg)  03/06/12 175 lb (79.379 kg)    amb anxious bf nad unusual affect HEENT mild turbinate edema.  Oropharynx no thrush or excess pnd or cobblestoning.  No JVD or cervical adenopathy. Mild accessory muscle hypertrophy. Trachea midline, nl thryroid. Chest was hyperinflated by percussion with diminished breath sounds and moderate increased exp time without wheeze. Hoover sign positive at mid inspiration. Regular rate and rhythm without murmur gallop or rub or increase P2 or edema.  Abd: no hsm, nl excursion. Ext warm without cyanosis or clubbing.    pcxr 02/03/13 Hyperexpanded lungs may represent changes of COPD/asthma. No  infiltrate, congestive heart failure or pneumothorax. Central  pulmonary vascular prominence.  Assessment:           Plan:

## 2013-02-27 DIAGNOSIS — J449 Chronic obstructive pulmonary disease, unspecified: Secondary | ICD-10-CM | POA: Insufficient documentation

## 2013-02-27 DIAGNOSIS — F17210 Nicotine dependence, cigarettes, uncomplicated: Secondary | ICD-10-CM | POA: Insufficient documentation

## 2013-02-27 NOTE — Assessment & Plan Note (Addendum)
>   3 min discussion  I emphasized that although we never turn away smokers from the pulmonary clinic, we do ask that they understand that the recommendations that we make  won't work nearly as well in the presence of continued cigarette exposure.  In fact, we may very well  reach a point where we can't promise to help the patient if he/she can't quit smoking. (We can and will promise to try to help, we just can't promise what we recommend will really work.  She at least agreed to work on cutting down at this point

## 2013-02-27 NOTE — Assessment & Plan Note (Signed)
-   hfa 75% 02/26/2013 p coaching  DDX of  difficult airways managment all start with A and  include Adherence, Ace Inhibitors, Acid Reflux, Active Sinus Disease, Alpha 1 Antitripsin deficiency, Anxiety masquerading as Airways dz,  ABPA,  allergy(esp in young), Aspiration (esp in elderly), Adverse effects of DPI,  Active smokers, plus two Bs  = Bronchiectasis and Beta blocker use..and one C= CHF   In this case Adherence is the biggest issue and starts with  inability to use HFA effectively and also  understand that SABA treats the symptoms but doesn't get to the underlying problem (inflammation).  I used  the analogy of putting steroid cream on a rash to help explain the meaning of topical therapy and the need to get the drug to the target tissue.   Need to start duler 200 2bid then regroup with pft's and see where she stands  Active smoking > discussed separately  The proper method of use, as well as anticipated side effects, of a metered-dose inhaler are discussed and demonstrated to the patient. Improved effectiveness after extensive coaching during this visit to a level of approximately  75%     Each maintenance medication was reviewed in detail including most importantly the difference between maintenance and as needed and under what circumstances the prns are to be used.  Please see instructions for details which were reviewed in writing and the patient given a copy.

## 2013-03-04 ENCOUNTER — Encounter (INDEPENDENT_AMBULATORY_CARE_PROVIDER_SITE_OTHER): Payer: Self-pay | Admitting: Surgery

## 2013-04-15 ENCOUNTER — Ambulatory Visit (INDEPENDENT_AMBULATORY_CARE_PROVIDER_SITE_OTHER): Payer: Medicaid Other | Admitting: Internal Medicine

## 2013-04-15 ENCOUNTER — Encounter: Payer: Self-pay | Admitting: Internal Medicine

## 2013-04-15 VITALS — BP 108/62 | HR 91 | Temp 97.9°F | Ht 69.5 in | Wt 134.0 lb

## 2013-04-15 DIAGNOSIS — R0989 Other specified symptoms and signs involving the circulatory and respiratory systems: Secondary | ICD-10-CM

## 2013-04-15 DIAGNOSIS — J449 Chronic obstructive pulmonary disease, unspecified: Secondary | ICD-10-CM

## 2013-04-15 DIAGNOSIS — R0600 Dyspnea, unspecified: Secondary | ICD-10-CM

## 2013-04-15 DIAGNOSIS — R0609 Other forms of dyspnea: Secondary | ICD-10-CM

## 2013-04-15 DIAGNOSIS — F17200 Nicotine dependence, unspecified, uncomplicated: Secondary | ICD-10-CM

## 2013-04-15 LAB — PULMONARY FUNCTION TEST

## 2013-04-15 MED ORDER — MOMETASONE FURO-FORMOTEROL FUM 200-5 MCG/ACT IN AERO: INHALATION_SPRAY | RESPIRATORY_TRACT | Status: DC

## 2013-04-15 NOTE — Progress Notes (Signed)
PFT done today. 

## 2013-04-15 NOTE — Progress Notes (Signed)
Subjective:     Patient ID: Courtney Terry, female   DOB: 07/25/1961 MRN: [id number]  HPI 76 yobf smoker with new sob 2014 referred to pulmonary clinic 02/26/2013 for ? Copd by [facility]/hospitalists. Sees NP  Yeboah.with GOLD IV copd by pft's 04/15/13  02/26/2013 1st pulmonary ov/ Dewane Timson Chief Complaint  Patient presents with  . Acute Visit    Pt c/o increased SOB for the past wk. She states gets SOB just walking short distances such as from lobby to exam room today and also sometimes gets SOB at rest. She has also noticed increased cough and wheezing. Cough is prod with moderate yellow to clear sputum.   indolent onset, variable sob activity and at rest x 3 months better on prednisone with apparent over use of saba at baseline with more day than night time symptoms. Transiently better p saba rec The key is to stop smoking completely before smoking completely stops you!  Prednisone 10 mg take  4 each am x 2 days,   2 each am x 2 days,  1 each am x2days and stop  Plan A= automatic = dulera 200 Take 2 puffs first thing in am and then another 2 puffs about 12 hours later.  Plan B = Backup = Only use your albuterol (red inhaler = proaire) as a rescue medication to be used if you can't catch your breath by resting or doing a relaxed purse lip breathing pattern. The less you use it, the better it will work when you need it.  Ok to use it up to 2 puffs every 4 hours but goal is not to need it at all.     04/15/2013 f/u ov/Tashiya Souders re gold iv copd/ still smoking Chief Complaint  Patient presents with  . Followup with PFT    Pt states her breathing has improved slightly since the last visit. She c/o nose bleeds on and off for the past 3 wks.    doe x greater than slow walk or > 100 ft, less saba use daytime  No obvious  daytime variabilty or assoc   cp or chest tightness, subjective wheeze overt sinus or hb symptoms. No unusual exp hx or h/o childhood pna/ asthma or premature birth to her knowledge.    Sleeping ok without nocturnal  or early am exacerbation  of respiratory  c/o's or need for noct saba. Also denies any obvious fluctuation of symptoms with weather or environmental changes or other aggravating or alleviating factors except as outlined above   Current Medications, Allergies, Past Medical History, Past Surgical History, Family History, and Social History were reviewed in [employer] record.  ROS  The following are not active complaints unless bolded sore throat, dysphagia, dental problems, itching, sneezing,  nasal congestion or excess/ purulent secretions, ear ache,   fever, chills, sweats, unintended wt loss, pleuritic or exertional cp, hemoptysis,  orthopnea pnd or leg swelling, presyncope, palpitations, heartburn, abdominal pain, anorexia, nausea, vomiting, diarrhea  or change in bowel or urinary habits, change in stools or urine, dysuria,hematuria,  rash, arthralgias, visual complaints, headache, numbness weakness or ataxia or problems with walking or coordination,  change in mood/affect or memory.            Objective:   Physical Exam   04/15/2013     134  Wt Readings from Last 3 Encounters:  02/26/13 139 lb 6.4 oz (63.231 kg)  06/23/12 149 lb (67.586 kg)  03/06/12 175 lb (79.379 kg)    amb anxious  bf nad unusual affect HEENT mild turbinate edema.  Oropharynx no thrush or excess pnd or cobblestoning.  No JVD or cervical adenopathy. Mild accessory muscle hypertrophy. Trachea midline, nl thryroid. Chest was hyperinflated by percussion with diminished breath sounds and moderate increased exp time without wheeze. Hoover sign positive at mid inspiration. Regular rate and rhythm without murmur gallop or rub or increase P2 or edema.  Abd: no hsm, nl excursion. Ext warm without cyanosis or clubbing.    pcxr 02/03/13 Hyperexpanded lungs may represent changes of COPD/asthma. No  infiltrate, congestive heart failure or pneumothorax. Central  pulmonary  vascular prominence.  Assessment:

## 2013-04-15 NOTE — Patient Instructions (Addendum)
The key is to stop smoking completely before smoking completely stops you!   Work on inhaler technique:  relax and gently blow all the way out then take a nice smooth deep breath back in, triggering the inhaler at same time you start breathing in.  Hold for up to 5 seconds if you can.  Rinse and gargle with water when done  Please schedule a follow up visit in 3 months but call sooner if needed

## 2013-04-19 NOTE — Assessment & Plan Note (Addendum)
   The proper method of use, as well as anticipated side effects, of a metered-dose inhaler are discussed and demonstrated to the patient. Improved effectiveness after extensive coaching during this visit to a level of approximately  75% from baseline of < 25%  Improved despite very severe airflow obst on pfts and only moderate success at using hfa and still smoking, discussed separately    Each maintenance medication was reviewed in detail including most importantly the difference between maintenance and as needed and under what circumstances the prns are to be used.  Please see instructions for details which were reviewed in writing and the patient given a copy.

## 2013-04-19 NOTE — Assessment & Plan Note (Signed)

## 2013-05-14 ENCOUNTER — Encounter (HOSPITAL_COMMUNITY): Payer: Self-pay | Admitting: Emergency Medicine

## 2013-05-14 ENCOUNTER — Emergency Department (HOSPITAL_COMMUNITY): Payer: Medicaid Other

## 2013-05-14 ENCOUNTER — Emergency Department (HOSPITAL_COMMUNITY)
Admission: EM | Admit: 2013-05-14 | Discharge: 2013-05-14 | Disposition: A | Discharge status: Home / Self Care | Payer: Medicaid Other | Attending: Emergency Medicine | Admitting: Emergency Medicine

## 2013-05-14 DIAGNOSIS — E079 Disorder of thyroid, unspecified: Secondary | ICD-10-CM | POA: Insufficient documentation

## 2013-05-14 DIAGNOSIS — Z79899 Other long term (current) drug therapy: Secondary | ICD-10-CM | POA: Insufficient documentation

## 2013-05-14 DIAGNOSIS — Z853 Personal history of malignant neoplasm of breast: Secondary | ICD-10-CM | POA: Insufficient documentation

## 2013-05-14 DIAGNOSIS — R05 Cough: Secondary | ICD-10-CM | POA: Insufficient documentation

## 2013-05-14 DIAGNOSIS — R21 Rash and other nonspecific skin eruption: Secondary | ICD-10-CM | POA: Insufficient documentation

## 2013-05-14 DIAGNOSIS — Z8739 Personal history of other diseases of the musculoskeletal system and connective tissue: Secondary | ICD-10-CM | POA: Insufficient documentation

## 2013-05-14 DIAGNOSIS — J45901 Unspecified asthma with (acute) exacerbation: Secondary | ICD-10-CM

## 2013-05-14 DIAGNOSIS — G40909 Epilepsy, unspecified, not intractable, without status epilepticus: Secondary | ICD-10-CM | POA: Insufficient documentation

## 2013-05-14 DIAGNOSIS — IMO0002 Reserved for concepts with insufficient information to code with codable children: Secondary | ICD-10-CM | POA: Insufficient documentation

## 2013-05-14 DIAGNOSIS — R059 Cough, unspecified: Secondary | ICD-10-CM | POA: Insufficient documentation

## 2013-05-14 DIAGNOSIS — F17200 Nicotine dependence, unspecified, uncomplicated: Secondary | ICD-10-CM | POA: Insufficient documentation

## 2013-05-14 MED ORDER — ALBUTEROL SULFATE (5 MG/ML) 0.5% IN NEBU
5.0000 mg | INHALATION_SOLUTION | Freq: Once | RESPIRATORY_TRACT | Status: AC
  Administered 2013-05-14: 5 mg via RESPIRATORY_TRACT
  Filled 2013-05-14 (×2): qty 0.5

## 2013-05-14 MED ORDER — PREDNISONE 20 MG PO TABS: 40.0000 mg | ORAL_TABLET | Freq: Every day | ORAL | Status: DC

## 2013-05-14 MED ORDER — DIPHENHYDRAMINE HCL 25 MG PO CAPS
25.0000 mg | ORAL_CAPSULE | Freq: Once | ORAL | Status: AC
Start: 2013-05-14 — End: 2013-05-14
  Administered 2013-05-14: 25 mg via ORAL
  Filled 2013-05-14: qty 1

## 2013-05-14 MED ORDER — ALBUTEROL SULFATE HFA 108 (90 BASE) MCG/ACT IN AERS: 2.0000 | INHALATION_SPRAY | Freq: Four times a day (QID) | RESPIRATORY_TRACT | Status: DC | PRN

## 2013-05-14 MED ORDER — ALBUTEROL SULFATE HFA 108 (90 BASE) MCG/ACT IN AERS
2.0000 | INHALATION_SPRAY | Freq: Once | RESPIRATORY_TRACT | Status: AC
  Administered 2013-05-14: 2 via RESPIRATORY_TRACT
  Filled 2013-05-14: qty 6.7

## 2013-05-14 NOTE — ED Notes (Signed)
Pt hx of asthma and said for the past hour sats 98% ra, pt does have a cough.

## 2013-05-14 NOTE — ED Provider Notes (Signed)
CSN: [id number]     Arrival date & time 05/14/13  1423 History    This chart was scribed for Francee Piccolo, PA working with Toy Baker, MD by Quintella Reichert, ED Scribe. This patient was seen in room WTR6/WTR6 and the patient's care was started at 3:08 PM.     Chief Complaint  Patient presents with  . Shortness of Breath  . Asthma    The history is provided by the patient. No language interpreter was used.    HPI Comments: Courtney Terry is a 52 y.o. female with asthma who presents to the Emergency Department complaining of 1 1/2 hours of asthma exacerbation.  Pt states that she was talking on the phone when she began to develop moderate SOB, wheezing, and cough productive of grayish sputum.  She states this is a typical asthma exacerbation for her.  She denies asymmetrical leg swelling, fever, chills, nausea, emesis or any other associated symptoms.  Pt reports she ran out of her albuterol inhaler yesterday.  She normally uses her inhaler 3x/day.  Pt denies recent surgeries or long travel.  She denies h/o DVT.  Pt also mentions a pruritic non-draining rash to her arms over the last few days.   Past Medical History  Diagnosis Date  . Thyroid disease   . Seizures     epilepsy  . Breast cancer 01/2012    right  . Arthritis   . Asthma     Past Surgical History  Procedure Laterality Date  . Knee surgery      right  . Tonsillectomy    . Ovarian cyst surgery    . Breast lumpectomy with needle localization and axillary sentinel lymph node bx  03/06/2012    Procedure: BREAST LUMPECTOMY WITH NEEDLE LOCALIZATION AND AXILLARY SENTINEL LYMPH NODE BX;  Surgeon: Clovis Pu. Cornett, MD;  Location: [facility];  Service: General;  Laterality: Right;  right breast needle localized lumpectomy and right sentinel lymph node mapping    Family History  Problem Relation Age of Onset  . Heart disease Mother   . Cancer Sister     cervical cancer  . Cancer Brother     colon   . Breast cancer Sister   . Cancer Sister     breast    History  Substance Use Topics  . Smoking status: Current Some Day Smoker -- 0.25 packs/day    Types: Cigarettes  . Smokeless tobacco: Never Used     Comment: less than .25 pack  . Alcohol Use: No    OB History   Grav Para Term Preterm Abortions TAB SAB Ect Mult Living                   Review of Systems  Constitutional: Negative for fever and chills.  Respiratory: Positive for cough, shortness of breath and wheezing.   Gastrointestinal: Negative for nausea and vomiting.  Skin: Positive for rash.      Allergies  Review of patient's allergies indicates no known allergies.  Home Medications   Current Outpatient Rx  Name  Route  Sig  Dispense  Refill  . albuterol (PROAIR HFA) 108 (90 BASE) MCG/ACT inhaler   Inhalation   Inhale 2 puffs into the lungs every 6 (six) hours as needed.         . divalproex (DEPAKOTE) 500 MG DR tablet   Oral   Take 500 mg by mouth 2 (two) times daily.         [name] Kitchen  FLUoxetine (PROZAC) 20 MG capsule   Oral   Take 20 mg by mouth 2 (two) times daily.         [name] Kitchen levothyroxine (SYNTHROID, LEVOTHROID) 75 MCG tablet   Oral   Take 75 mcg by mouth daily.         [name] Kitchen albuterol (PROVENTIL HFA;VENTOLIN HFA) 108 (90 BASE) MCG/ACT inhaler   Inhalation   Inhale 2 puffs into the lungs every 6 (six) hours as needed for wheezing.   1 Inhaler   2   . mometasone-formoterol (DULERA) 200-5 MCG/ACT AERO      Take 2 puffs first thing in am and then another 2 puffs about 12 hours later.   1 Inhaler   11   . predniSONE (DELTASONE) 20 MG tablet   Oral   Take 2 tablets (40 mg total) by mouth daily.   10 tablet   0    BP 129/84  Pulse 91  Temp(Src) 98.2 F (36.8 C) (Oral)  Resp 22  SpO2 98%  Physical Exam  Nursing note and vitals reviewed. Constitutional: She is oriented to person, place, and time. She appears well-developed and well-nourished. No distress.  HENT:  Head:  Normocephalic and atraumatic.  Mouth/Throat: No oropharyngeal exudate.  Eyes: EOM are normal.  Neck: Neck supple. No tracheal deviation present.  Cardiovascular: Normal rate, regular rhythm and normal heart sounds.   No murmur heard. Pulmonary/Chest: Effort normal. Not tachypneic. No respiratory distress. She has no decreased breath sounds. She has wheezes in the right lower field and the left lower field. She has no rhonchi. She has no rales.  Musculoskeletal: Normal range of motion.  Neurological: She is alert and oriented to person, place, and time.  Skin: Skin is warm and dry. Rash noted. Rash is maculopapular.  Psychiatric: She has a normal mood and affect. Her behavior is normal.    ED Course  Procedures (including critical care time)  DIAGNOSTIC STUDIES: Oxygen Saturation is 98% on room air, normal by my interpretation.    COORDINATION OF CARE: 3:12 PM-Informed pt that imaging ruled out pneumonia.  Discussed treatment plan which includes breathing treatment for asthma symptoms and Benadryl for rash with pt at bedside and pt agreed to plan.     Labs Reviewed - No data to display   Dg Chest 2 View  05/14/2013   *RADIOLOGY REPORT*  Clinical Data: Shortness of breath, cough, history of asthma and smoking  CHEST - 2 VIEW  Comparison: 02/03/2013; 10/28/2012  Findings: Grossly unchanged cardiac silhouette and mediastinal contours.  The lungs remain hyperexpanded with flattening of bilateral hemidiaphragms and blunting of the bilateral costophrenic angles.  Nipple shadows overlie the bilateral mid/lower lungs.  No focal airspace opacities.  No pleural effusion or pneumothorax.  No evidence of edema.  Surgical clips overlie the lateral aspect of the right breast.  Unchanged bones.  IMPRESSION: Hyperexpanded lungs without acute cardiopulmonary disease.   Original Report Authenticated By: Tacey Ruiz, MD    1. Asthma attack   2. Smoker      MDM  Patient ambulated in ED with O2  saturations maintained >90, no current signs of respiratory distress. Lung exam improved after nebulizer treatment. Prednisone given in the ED and pt will bd dc with 5 day burst. Pt states they are breathing at baseline. No concern for PE. Pt has been instructed to continue using prescribed medications and to speak with PCP about today's exacerbation. Patient is agreeable to plan. Patient is stable at time of  discharge      I personally performed the services described in this documentation, which was scribed in my presence. The recorded information has been reviewed and is accurate.    Jeannetta Ellis, PA-C 05/14/13 2007

## 2013-05-15 NOTE — ED Provider Notes (Signed)
Medical screening examination/treatment/procedure(s) were performed by non-physician practitioner and as supervising physician I was immediately available for consultation/collaboration.  Nakeysha Pasqual T Aryanah Enslow, MD 05/15/13 1517 

## 2013-05-18 ENCOUNTER — Encounter (HOSPITAL_COMMUNITY): Payer: Self-pay | Admitting: Emergency Medicine

## 2013-05-18 ENCOUNTER — Emergency Department (HOSPITAL_COMMUNITY)
Admission: EM | Admit: 2013-05-18 | Discharge: 2013-05-18 | Disposition: A | Discharge status: Home / Self Care | Payer: Medicaid Other | Attending: Emergency Medicine | Admitting: Emergency Medicine

## 2013-05-18 DIAGNOSIS — Z853 Personal history of malignant neoplasm of breast: Secondary | ICD-10-CM | POA: Insufficient documentation

## 2013-05-18 DIAGNOSIS — Z79899 Other long term (current) drug therapy: Secondary | ICD-10-CM | POA: Insufficient documentation

## 2013-05-18 DIAGNOSIS — E079 Disorder of thyroid, unspecified: Secondary | ICD-10-CM | POA: Insufficient documentation

## 2013-05-18 DIAGNOSIS — J45901 Unspecified asthma with (acute) exacerbation: Secondary | ICD-10-CM | POA: Insufficient documentation

## 2013-05-18 DIAGNOSIS — J4541 Moderate persistent asthma with (acute) exacerbation: Secondary | ICD-10-CM

## 2013-05-18 DIAGNOSIS — IMO0002 Reserved for concepts with insufficient information to code with codable children: Secondary | ICD-10-CM | POA: Insufficient documentation

## 2013-05-18 DIAGNOSIS — R6883 Chills (without fever): Secondary | ICD-10-CM | POA: Insufficient documentation

## 2013-05-18 DIAGNOSIS — G40909 Epilepsy, unspecified, not intractable, without status epilepticus: Secondary | ICD-10-CM | POA: Insufficient documentation

## 2013-05-18 DIAGNOSIS — R110 Nausea: Secondary | ICD-10-CM | POA: Insufficient documentation

## 2013-05-18 DIAGNOSIS — R197 Diarrhea, unspecified: Secondary | ICD-10-CM | POA: Insufficient documentation

## 2013-05-18 DIAGNOSIS — Z8739 Personal history of other diseases of the musculoskeletal system and connective tissue: Secondary | ICD-10-CM | POA: Insufficient documentation

## 2013-05-18 DIAGNOSIS — F17200 Nicotine dependence, unspecified, uncomplicated: Secondary | ICD-10-CM | POA: Insufficient documentation

## 2013-05-18 MED ORDER — PREDNISONE 20 MG PO TABS
60.0000 mg | ORAL_TABLET | Freq: Once | ORAL | Status: AC
  Administered 2013-05-18: 60 mg via ORAL
  Filled 2013-05-18: qty 3

## 2013-05-18 MED ORDER — IPRATROPIUM BROMIDE 0.02 % IN SOLN
0.5000 mg | Freq: Once | RESPIRATORY_TRACT | Status: AC
  Administered 2013-05-18: 0.5 mg via RESPIRATORY_TRACT
  Filled 2013-05-18: qty 2.5

## 2013-05-18 MED ORDER — ALBUTEROL SULFATE (5 MG/ML) 0.5% IN NEBU
5.0000 mg | INHALATION_SOLUTION | Freq: Once | RESPIRATORY_TRACT | Status: AC
  Administered 2013-05-18: 5 mg via RESPIRATORY_TRACT
  Filled 2013-05-18: qty 1

## 2013-05-18 NOTE — ED Provider Notes (Signed)
CSN: [id number]     Arrival date & time 05/18/13  1006 History     First MD Initiated Contact with Patient 05/18/13 1023     Chief Complaint  Patient presents with  . Asthma   (Consider location/radiation/quality/duration/timing/severity/associated sxs/prior Treatment) HPI Pt is a 52yo female with hx of asthma, seen in ED for asthma exacerbation on 8/8, was tx with breathing tx, discharged home with inhaler and advised to f/u with PCP.  Pt states today she started having moderate SOB last night associated with wheezing.  States she used her inhaler but no relief.  States this feels like her typical asthma exacerbation.  Pt also c/o diarrhea, unable to provide number of times this occurred but states she has had multiple episodes of watery diarrhea since last night, no blood or mucous in stool.  Denies fever, cough, chest pain or abdominal pain.  Pt is a smoker.  Typically uses her albuterol inhaler 3x/day.  Has not f/u with PCP since last exacerbation.  Pt states she was given samples of an inhaler last time she saw her pulmonologist, Dr. Sherene Sires but has been unable to fill prescription due to financial problems.  States next appointment is 54mo from now.    Past Medical History  Diagnosis Date  . Thyroid disease   . Seizures     epilepsy  . Breast cancer 01/2012    right  . Arthritis   . Asthma    Past Surgical History  Procedure Laterality Date  . Knee surgery      right  . Tonsillectomy    . Ovarian cyst surgery    . Breast lumpectomy with needle localization and axillary sentinel lymph node bx  03/06/2012    Procedure: BREAST LUMPECTOMY WITH NEEDLE LOCALIZATION AND AXILLARY SENTINEL LYMPH NODE BX;  Surgeon: Clovis Pu. Cornett, MD;  Location: [facility];  Service: General;  Laterality: Right;  right breast needle localized lumpectomy and right sentinel lymph node mapping   Family History  Problem Relation Age of Onset  . Heart disease Mother   . Cancer Sister    cervical cancer  . Cancer Brother     colon  . Breast cancer Sister   . Cancer Sister     breast   History  Substance Use Topics  . Smoking status: Current Some Day Smoker -- 0.25 packs/day    Types: Cigarettes  . Smokeless tobacco: Never Used     Comment: less than .25 pack  . Alcohol Use: No   OB History   Grav Para Term Preterm Abortions TAB SAB Ect Mult Living                 Review of Systems  Constitutional: Positive for chills. Negative for fever.  Respiratory: Positive for shortness of breath and wheezing. Negative for cough.   Cardiovascular: Negative for chest pain.  Gastrointestinal: Positive for nausea and diarrhea. Negative for vomiting, abdominal pain and constipation.  All other systems reviewed and are negative.    Allergies  Review of patient's allergies indicates no known allergies.  Home Medications   Current Outpatient Rx  Name  Route  Sig  Dispense  Refill  . albuterol (PROVENTIL HFA;VENTOLIN HFA) 108 (90 BASE) MCG/ACT inhaler   Inhalation   Inhale 2 puffs into the lungs every 6 (six) hours as needed for wheezing.         . divalproex (DEPAKOTE) 500 MG DR tablet   Oral   Take 500 mg by  mouth 2 (two) times daily.         [name] Kitchen FLUoxetine (PROZAC) 20 MG capsule   Oral   Take 20 mg by mouth 2 (two) times daily.         [name] Kitchen levothyroxine (SYNTHROID, LEVOTHROID) 75 MCG tablet   Oral   Take 75 mcg by mouth daily.         . mometasone-formoterol (DULERA) 200-5 MCG/ACT AERO      Take 2 puffs first thing in am and then another 2 puffs about 12 hours later.   1 Inhaler   11   . predniSONE (DELTASONE) 20 MG tablet   Oral   Take 2 tablets (40 mg total) by mouth daily.   10 tablet   0    BP 123/84  Pulse 97  Temp(Src) 98.2 F (36.8 C) (Oral)  Resp 18  SpO2 95% Physical Exam  Nursing note and vitals reviewed. Constitutional: She appears well-developed and well-nourished. She appears distressed.  Pt having difficulty breathing, in  tripod position in exam room.   HENT:  Head: Normocephalic and atraumatic.  Eyes: Conjunctivae are normal. No scleral icterus.  Neck: Normal range of motion.  Cardiovascular: Normal rate, regular rhythm and normal heart sounds.   Pulmonary/Chest: Accessory muscle usage present. She is in respiratory distress. She has decreased breath sounds. She has wheezes ( mild bibasilar). She has no rhonchi. She has no rales. She exhibits no tenderness.  Able to speak in short phrases.  Having difficulty breathing. Decreased lung sounds in all fields.   Abdominal: Soft. Bowel sounds are normal. She exhibits no distension and no mass. There is no tenderness. There is no rebound and no guarding.  Musculoskeletal: Normal range of motion.  Neurological: She is alert.  Skin: Skin is warm and dry. She is not diaphoretic.    ED Course   Procedures (including critical care time)  Labs Reviewed - No data to display No results found. 1. Asthma exacerbation, moderate persistent   2. Smoking addiction     MDM  Pt improved with breathing and lung sounds after breathing treatment.  Pt was given prednisone.  Pt states she was given prescription on Friday for same but has not filled yet.  Encouraged pt to call Dr. Sherene Sires and let him know she has been seen twice in 5 days for breathing treatment.  I also stressed importance of smoking cessation and need for additional medication, as albuterol should be used as rescue inhaler, not 3-6x/day as pt currently uses medication.  Return precautions provided.  Pt verbalized understanding and agreement with tx plan.   Junius Finner, PA-C 05/18/13 1131

## 2013-05-18 NOTE — ED Notes (Signed)
Pt states hx of asthma.  States that she has been SOB since last night.  Pt able to finish sentences.

## 2013-05-19 NOTE — ED Provider Notes (Signed)
Medical screening examination/treatment/procedure(s) were performed by non-physician practitioner and as supervising physician I was immediately available for consultation/collaboration.  Ulys Favia R. Kishon Garriga, MD 05/19/13 0651 

## 2013-05-20 ENCOUNTER — Telehealth: Payer: Self-pay | Admitting: Pulmonary Disease

## 2013-05-20 NOTE — Telephone Encounter (Signed)
Spoke with patient, made her aware last OV has been faxed to lawyer and nothing further needed at this time

## 2013-05-20 NOTE — Telephone Encounter (Signed)
Spoke with patient-- she states this matter with the transit has now been resolvd and nothing further is needed from our office at this time  Patient requesting to be seen by Dr. Sherene Sires-- appt scheduled for 05/28/13 @ 245pm  Patient requesting note be faxed to her "lawyers office" at [employer] @ [phone] Patient states in this note it needs to discuss pts health status-- per patient this is to be used to help patient stay out of prison Dr. Sherene Sires please advise if this can be done, thank you

## 2013-05-20 NOTE — Telephone Encounter (Signed)
All we can do is send the last note and address other issues at next ov and update the letter to the lawyer then

## 2013-05-28 ENCOUNTER — Telehealth: Payer: Self-pay | Admitting: Internal Medicine

## 2013-05-28 ENCOUNTER — Encounter: Payer: Self-pay | Admitting: Internal Medicine

## 2013-05-28 ENCOUNTER — Ambulatory Visit (INDEPENDENT_AMBULATORY_CARE_PROVIDER_SITE_OTHER): Payer: Medicaid Other | Admitting: Internal Medicine

## 2013-05-28 VITALS — BP 110/62 | HR 108 | Temp 98.9°F | Ht 71.0 in | Wt 142.0 lb

## 2013-05-28 DIAGNOSIS — F17200 Nicotine dependence, unspecified, uncomplicated: Secondary | ICD-10-CM

## 2013-05-28 DIAGNOSIS — J449 Chronic obstructive pulmonary disease, unspecified: Secondary | ICD-10-CM

## 2013-05-28 MED ORDER — ACLIDINIUM BROMIDE 400 MCG/ACT IN AEPB: 1.0000 | INHALATION_SPRAY | Freq: Two times a day (BID) | RESPIRATORY_TRACT | Status: DC

## 2013-05-28 MED ORDER — ALPRAZOLAM 0.5 MG PO TABS: 0.5000 mg | ORAL_TABLET | Freq: Every evening | ORAL | Status: DC | PRN

## 2013-05-28 NOTE — Telephone Encounter (Signed)
I spoke with pt. She stated she wants a note sent to her lawyer we are putting her on O2. I advised her we have note put her on O2 she has not qualified for this yet. She voiced her understanding and needed nothing further

## 2013-05-28 NOTE — Patient Instructions (Addendum)
Plan A = automatic = dulera 200 2 puffs and Tudorza one puff repeat every 12 hours  Plan B = backup = albuterol = proaire/ ventolin >  Only use  l as a rescue medication to be used if you can't catch your breath by resting or doing a relaxed purse lip breathing pattern. The less you use it, the better it will work when you need it. Ok to use up to 2 puffs every 4 hours  Plan C = xanax (alprazolam) take 0.5 mg every 4 hours if can't get your breath after using Plan B first  Please see patient coordinator before you leave today  to schedule overnight oxygen study     Please schedule a follow up office visit in 4 weeks, sooner if needed  (keep the original appoitment if sooner

## 2013-05-28 NOTE — Progress Notes (Signed)
Subjective:     Patient ID: Courtney Terry, female   DOB: 1961/01/14 MRN: [id number]  Brief patient profile:  48 yobf smoker with new sob 2014 referred to pulmonary clinic 02/26/2013 for ? Copd by [facility]/hospitalists. Sees NP  Yeboah.with GOLD IV copd by pft's 04/15/13   HPI 02/26/2013 1st pulmonary ov/ Courtney Terry Chief Complaint  Patient presents with  . Acute Visit    Pt c/o increased SOB for the past wk. She states gets SOB just walking short distances such as from lobby to exam room today and also sometimes gets SOB at rest. She has also noticed increased cough and wheezing. Cough is prod with moderate yellow to clear sputum.   indolent onset, variable sob activity and at rest x 3 months better on prednisone with apparent over use of saba at baseline with more day than night time symptoms. Transiently better p saba rec The key is to stop smoking completely before smoking completely stops you!  Prednisone 10 mg take  4 each am x 2 days,   2 each am x 2 days,  1 each am x2days and stop  Plan A= automatic = dulera 200 Take 2 puffs first thing in am and then another 2 puffs about 12 hours later.  Plan B = Backup = Only use your albuterol (red inhaler = proaire) as a rescue medication     04/15/2013 f/u ov/Courtney Terry re gold iv copd/ still smoking Chief Complaint  Patient presents with  . Followup with PFT    Pt states her breathing has improved slightly since the last visit. She c/o nose bleeds on and off for the past 3 wks.    doe x greater than slow walk or > 100 ft, less saba use daytime rec  key is to stop smoking completely before smoking completely stops you!  Work on inhaler technique:     05/28/2013 f/u ov/Courtney Terry copd G IV stop smoking around mid July Chief Complaint  Patient presents with  . COPD    Breathing has gotten worse since last OV. Has to been to the hospital. Reports her breathing is worse at night.  sob sitting still, worse lying down, x 50 ft rep   No obvious  daytime  variabilty or assoc cough   cp or chest tightness, subjective wheeze overt sinus or hb symptoms. No unusual exp hx or h/o childhood pna/ asthma or premature birth to her knowledge.   Sleeping ok without nocturnal  or early am exacerbation  of respiratory  c/o's or need for noct saba. Also denies any obvious fluctuation of symptoms with weather or environmental changes or other aggravating or alleviating factors except as outlined above   Current Medications, Allergies, Past Medical History, Past Surgical History, Family History, and Social History were reviewed in [employer] record.  ROS  The following are not active complaints unless bolded sore throat, dysphagia, dental problems, itching, sneezing,  nasal congestion or excess/ purulent secretions, ear ache,   fever, chills, sweats, unintended wt loss, pleuritic or exertional cp, hemoptysis,  orthopnea pnd or leg swelling, presyncope, palpitations, heartburn, abdominal pain, anorexia, nausea, vomiting, diarrhea  or change in bowel or urinary habits, change in stools or urine, dysuria,hematuria,  rash, arthralgias, visual complaints, headache, numbness weakness or ataxia or problems with walking or coordination,  change in mood/affect or memory.            Objective:   Physical Exam   04/15/2013     134  Vs 142  05/28/13  Wt Readings from Last 3 Encounters:  02/26/13 139 lb 6.4 oz (63.231 kg)  06/23/12 149 lb (67.586 kg)  03/06/12 175 lb (79.379 kg)    amb anxious bf nad unusual affect HEENT mild turbinate edema.  Oropharynx no thrush or excess pnd or cobblestoning.  No JVD or cervical adenopathy. Mild accessory muscle hypertrophy. Trachea midline, nl thryroid. Chest was hyperinflated by percussion with diminished breath sounds and moderate increased exp time without wheeze. Hoover sign positive at mid inspiration. Regular rate and rhythm without murmur gallop or rub or increase P2 or edema.  Abd: no hsm, nl excursion.  Ext warm without cyanosis or clubbing.       cxr 05/14/13 Hyperexpanded lungs without acute cardiopulmonary disease.   Assessment:

## 2013-05-29 NOTE — Assessment & Plan Note (Signed)

## 2013-05-29 NOTE — Assessment & Plan Note (Addendum)
-   PFT's 04/15/13 FEV1  0.56 (18%) ratio 37 and 27% better p B2  DLCO 39 corrects to 60 - hfa 75% 04/15/13 p coaching  - 05/28/2013   Walked RA x one lap @ 185 stopped due to  Sob no tachypnea with sat 93%  - ono RA ordered 05/28/2013 >>  DDX of  difficult airways managment all start with A and  include Adherence, Ace Inhibitors, Acid Reflux, Active Sinus Disease, Alpha 1 Antitripsin deficiency, Anxiety masquerading as Airways dz,  ABPA,  allergy(esp in young), Aspiration (esp in elderly), Adverse effects of DPI,  Active smokers, plus two Bs  = Bronchiectasis and Beta blocker use..and one C= CHF  Adherence is always the initial "prime suspect" and is a multilayered concern that requires a "trust but verify" approach in every patient - starting with knowing how to use medications, especially inhalers, correctly, keeping up with refills and understanding the fundamental difference between maintenance and prns vs those medications only taken for a very short course and then stopped and not refilled. The proper method of use, as well as anticipated side effects, of a metered-dose inhaler are discussed and demonstrated to the patient. Improved effectiveness after extensive coaching during this visit to a level of approximately  90% so try adding tudorza one bid  ? Anxiety > try adding prn xanax when prn saba not effective  Active smoking discussed separately

## 2013-05-31 ENCOUNTER — Emergency Department (HOSPITAL_COMMUNITY)
Admission: EM | Admit: 2013-05-31 | Discharge: 2013-05-31 | Disposition: A | Discharge status: Home / Self Care | Payer: Medicaid Other | Attending: Emergency Medicine | Admitting: Emergency Medicine

## 2013-05-31 ENCOUNTER — Emergency Department (HOSPITAL_COMMUNITY): Payer: Medicaid Other

## 2013-05-31 DIAGNOSIS — M129 Arthropathy, unspecified: Secondary | ICD-10-CM | POA: Insufficient documentation

## 2013-05-31 DIAGNOSIS — E039 Hypothyroidism, unspecified: Secondary | ICD-10-CM | POA: Insufficient documentation

## 2013-05-31 DIAGNOSIS — IMO0002 Reserved for concepts with insufficient information to code with codable children: Secondary | ICD-10-CM | POA: Insufficient documentation

## 2013-05-31 DIAGNOSIS — R0602 Shortness of breath: Secondary | ICD-10-CM | POA: Insufficient documentation

## 2013-05-31 DIAGNOSIS — R197 Diarrhea, unspecified: Secondary | ICD-10-CM | POA: Insufficient documentation

## 2013-05-31 DIAGNOSIS — R0989 Other specified symptoms and signs involving the circulatory and respiratory systems: Secondary | ICD-10-CM | POA: Insufficient documentation

## 2013-05-31 DIAGNOSIS — Z853 Personal history of malignant neoplasm of breast: Secondary | ICD-10-CM | POA: Insufficient documentation

## 2013-05-31 DIAGNOSIS — J441 Chronic obstructive pulmonary disease with (acute) exacerbation: Secondary | ICD-10-CM

## 2013-05-31 DIAGNOSIS — F17200 Nicotine dependence, unspecified, uncomplicated: Secondary | ICD-10-CM | POA: Insufficient documentation

## 2013-05-31 DIAGNOSIS — R079 Chest pain, unspecified: Secondary | ICD-10-CM | POA: Insufficient documentation

## 2013-05-31 DIAGNOSIS — R05 Cough: Secondary | ICD-10-CM | POA: Insufficient documentation

## 2013-05-31 DIAGNOSIS — R0609 Other forms of dyspnea: Secondary | ICD-10-CM | POA: Insufficient documentation

## 2013-05-31 DIAGNOSIS — J3489 Other specified disorders of nose and nasal sinuses: Secondary | ICD-10-CM | POA: Insufficient documentation

## 2013-05-31 DIAGNOSIS — R0789 Other chest pain: Secondary | ICD-10-CM | POA: Insufficient documentation

## 2013-05-31 DIAGNOSIS — R059 Cough, unspecified: Secondary | ICD-10-CM | POA: Insufficient documentation

## 2013-05-31 DIAGNOSIS — G40909 Epilepsy, unspecified, not intractable, without status epilepticus: Secondary | ICD-10-CM | POA: Insufficient documentation

## 2013-05-31 DIAGNOSIS — Z79899 Other long term (current) drug therapy: Secondary | ICD-10-CM | POA: Insufficient documentation

## 2013-05-31 DIAGNOSIS — Z9089 Acquired absence of other organs: Secondary | ICD-10-CM | POA: Insufficient documentation

## 2013-05-31 LAB — URINALYSIS, ROUTINE W REFLEX MICROSCOPIC
Bilirubin Urine: NEGATIVE
Glucose, UA: NEGATIVE mg/dL
Hgb urine dipstick: NEGATIVE
Ketones, ur: NEGATIVE mg/dL
Nitrite: NEGATIVE
Protein, ur: NEGATIVE mg/dL
Specific Gravity, Urine: 1.031 — ABNORMAL HIGH (ref 1.005–1.030)
Urobilinogen, UA: 0.2 mg/dL (ref 0.0–1.0)
pH: 5 (ref 5.0–8.0)

## 2013-05-31 LAB — CBC WITH DIFFERENTIAL/PLATELET
Basophils Absolute: 0 10*3/uL (ref 0.0–0.1)
Basophils Relative: 0 % (ref 0–1)
Eosinophils Absolute: 0.2 10*3/uL (ref 0.0–0.7)
Eosinophils Relative: 2 % (ref 0–5)
HCT: 42.2 % (ref 36.0–46.0)
Hemoglobin: 13.8 g/dL (ref 12.0–15.0)
Lymphocytes Relative: 26 % (ref 12–46)
Lymphs Abs: 3 10*3/uL (ref 0.7–4.0)
MCH: 27.8 pg (ref 26.0–34.0)
MCHC: 32.7 g/dL (ref 30.0–36.0)
MCV: 85.1 fL (ref 78.0–100.0)
Monocytes Absolute: 0.7 10*3/uL (ref 0.1–1.0)
Monocytes Relative: 6 % (ref 3–12)
Neutro Abs: 7.7 10*3/uL (ref 1.7–7.7)
Neutrophils Relative %: 66 % (ref 43–77)
Platelets: 264 10*3/uL (ref 150–400)
RBC: 4.96 MIL/uL (ref 3.87–5.11)
RDW: 15.3 % (ref 11.5–15.5)
WBC: 11.6 10*3/uL — ABNORMAL HIGH (ref 4.0–10.5)

## 2013-05-31 LAB — BASIC METABOLIC PANEL
BUN: 20 mg/dL (ref 6–23)
CO2: 27 mEq/L (ref 19–32)
Calcium: 9.1 mg/dL (ref 8.4–10.5)
Chloride: 103 mEq/L (ref 96–112)
Creatinine, Ser: 0.77 mg/dL (ref 0.50–1.10)
GFR calc Af Amer: >90 mL/min (ref >90)
GFR calc non Af Amer: >90 mL/min (ref >90)
Glucose, Bld: 86 mg/dL (ref 70–99)
Potassium: 4 mEq/L (ref 3.5–5.1)
Sodium: 138 mEq/L (ref 135–145)

## 2013-05-31 LAB — URINE MICROSCOPIC-ADD ON

## 2013-05-31 MED ORDER — DEXAMETHASONE SODIUM PHOSPHATE 10 MG/ML IJ SOLN
10.0000 mg | Freq: Once | INTRAMUSCULAR | Status: AC
Start: 2013-05-31 — End: 2013-05-31
  Administered 2013-05-31: 10 mg via INTRAVENOUS
  Filled 2013-05-31: qty 1

## 2013-05-31 MED ORDER — PREDNISONE 50 MG PO TABS: 50.0000 mg | ORAL_TABLET | Freq: Every day | ORAL | Status: DC

## 2013-05-31 MED ORDER — ALBUTEROL (5 MG/ML) CONTINUOUS INHALATION SOLN
10.0000 mg/h | INHALATION_SOLUTION | RESPIRATORY_TRACT | Status: AC
  Administered 2013-05-31: 10 mg/h via RESPIRATORY_TRACT
  Filled 2013-05-31: qty 20

## 2013-05-31 NOTE — ED Provider Notes (Signed)
CSN: [id number]     Arrival date & time 05/31/13  4098 History     First MD Initiated Contact with Patient 05/31/13 [phone]     Chief Complaint  Patient presents with  . Shortness of Breath   (Consider location/radiation/quality/duration/timing/severity/associated sxs/prior Treatment) HPI 52 YO smoker with hx of COPD IV presents with dyspnea, chest tightness, and inability to catch breath that started last night. Cough productive of grayish sputum and rhinorrhea began ~1 week ago. 3 days ago began to experience 2 episodes/day of watery diarrhea. Last night pt began to feel dyspnic, have chest pain with cough, chest tightness, and inability to catch her breath.She was unable to sleep because she could not catch her breath. Hot tea helped her shortness of breath some. Walking and heat made her shortness of breath worse. She reports taking Dulera and Carlos American as directed, but says they "do not work anymore".  This morning she felt nauseous, started sweating, and then vomited her breakfast. She has not taken anything else to help the sx. Denies fever, hemoptysis, hematemesis, heart palpitations, sore throat, congestion, abdominal pain or cramping. Pt says this feels similar to her previous hospitalizations for her Asthma.  Past Medical History  Diagnosis Date  . Thyroid disease   . Seizures     epilepsy  . Breast cancer 01/2012    right  . Arthritis   . Asthma    Past Surgical History  Procedure Laterality Date  . Knee surgery      right  . Tonsillectomy    . Ovarian cyst surgery    . Breast lumpectomy with needle localization and axillary sentinel lymph node bx  03/06/2012    Procedure: BREAST LUMPECTOMY WITH NEEDLE LOCALIZATION AND AXILLARY SENTINEL LYMPH NODE BX;  Surgeon: Clovis Pu. Cornett, MD;  Location: [facility];  Service: General;  Laterality: Right;  right breast needle localized lumpectomy and right sentinel lymph node mapping   Family History  Problem Relation Age  of Onset  . Heart disease Mother   . Cancer Sister     cervical cancer  . Cancer Brother     colon  . Breast cancer Sister   . Cancer Sister     breast   History  Substance Use Topics  . Smoking status: Current Some Day Smoker -- 0.25 packs/day    Types: Cigarettes  . Smokeless tobacco: Never Used     Comment: less than .25 pack  . Alcohol Use: No   OB History   Grav Para Term Preterm Abortions TAB SAB Ect Mult Living                 Review of Systems .[name] KitchenAll other systems negative except as documented in the HPI. All pertinent positives and negatives as reviewed in the HPI.  Allergies  Review of patient's allergies indicates no known allergies.  Home Medications   Current Outpatient Rx  Name  Route  Sig  Dispense  Refill  . Aclidinium Bromide (TUDORZA PRESSAIR) 400 MCG/ACT AEPB   Inhalation   Inhale 1 puff into the lungs 2 (two) times daily. One twice daily   1 each   11   . ALPRAZolam (XANAX) 0.5 MG tablet   Oral   Take 1 tablet (0.5 mg total) by mouth at bedtime as needed for sleep.   90 tablet   0   . divalproex (DEPAKOTE) 500 MG DR tablet   Oral   Take 500 mg by mouth 2 (two) times daily.         [name] Kitchen  FLUoxetine (PROZAC) 20 MG capsule   Oral   Take 20 mg by mouth 2 (two) times daily.         [name] Kitchen levothyroxine (SYNTHROID, LEVOTHROID) 75 MCG tablet   Oral   Take 75 mcg by mouth daily.         . mometasone-formoterol (DULERA) 200-5 MCG/ACT AERO   Inhalation   Inhale 2 puffs into the lungs daily as needed for wheezing or shortness of breath.         . predniSONE (DELTASONE) 20 MG tablet   Oral   Take 2 tablets (40 mg total) by mouth daily.   10 tablet   0   . albuterol (PROVENTIL HFA;VENTOLIN HFA) 108 (90 BASE) MCG/ACT inhaler   Inhalation   Inhale 2 puffs into the lungs every 6 (six) hours as needed for wheezing or shortness of breath.           BP 114/86  Pulse 87  Temp(Src) 97.8 F (36.6 C) (Oral)  Resp 14  SpO2 97% Physical Exam   Nursing note and vitals reviewed. Constitutional: She is oriented to person, place, and time. Vital signs are normal. She appears well-developed and well-nourished. She is cooperative. She does not appear ill. No distress. Nasal cannula in place.  HENT:  Head: Normocephalic and atraumatic.  Mouth/Throat: Uvula is midline, oropharynx is clear and moist and mucous membranes are normal.  Eyes: Pupils are equal, round, and reactive to light.  Neck: Normal range of motion. Neck supple.  Cardiovascular: Normal rate, regular rhythm, S1 normal, S2 normal and normal heart sounds.   Pulmonary/Chest: Effort normal. No respiratory distress. She has wheezes. She has no rales. She exhibits no tenderness, no bony tenderness, no edema and no deformity.  Diffuse coarse breath sounds in all lung fields.  Abdominal: Soft. Normal appearance. She exhibits no distension. There is no tenderness. There is no guarding.  Lymphadenopathy:       Head (right side): No submental, no submandibular, no preauricular, no posterior auricular and no occipital adenopathy present.       Head (left side): No submental, no preauricular, no posterior auricular and no occipital adenopathy present.    She has no cervical adenopathy.  Neurological: She is alert and oriented to person, place, and time. She has normal reflexes.  Skin: Skin is warm.  Psychiatric: She has a normal mood and affect. Her speech is normal and behavior is normal. Judgment and thought content normal.    ED Course   Procedures (including critical care time)  Labs Reviewed  CBC WITH DIFFERENTIAL  BASIC METABOLIC PANEL  URINALYSIS, ROUTINE W REFLEX MICROSCOPIC   Patient has tolerated ambulation without difficulty.  Patient maintained oxygen saturation above 96%.  Patient is feeling better.  We'll have the patient follow up with her primary care Dr.  MDM  MDM Reviewed: vitals and nursing note Interpretation: labs, x-ray and ECG      Carlyle Dolly, PA-C 05/31/13 1355

## 2013-06-02 LAB — URINE CULTURE
Colony Count: NO GROWTH
Culture: NO GROWTH

## 2013-06-02 NOTE — ED Provider Notes (Signed)
Medical screening examination/treatment/procedure(s) were performed by non-physician practitioner and as supervising physician I was immediately available for consultation/collaboration.    Christopher J. Pollina, MD 06/02/13 1641 

## 2013-06-03 ENCOUNTER — Emergency Department (HOSPITAL_COMMUNITY): Payer: Medicaid Other

## 2013-06-03 ENCOUNTER — Encounter: Payer: Self-pay | Admitting: Internal Medicine

## 2013-06-03 ENCOUNTER — Inpatient Hospital Stay (HOSPITAL_COMMUNITY)
Admission: EM | Admit: 2013-06-03 | Discharge: 2013-06-04 | DRG: 918 | Disposition: A | Discharge status: Home / Self Care | Payer: Medicaid Other | Attending: Internal Medicine | Admitting: Internal Medicine

## 2013-06-03 ENCOUNTER — Encounter (HOSPITAL_COMMUNITY): Payer: Self-pay | Admitting: *Deleted

## 2013-06-03 DIAGNOSIS — M7989 Other specified soft tissue disorders: Secondary | ICD-10-CM

## 2013-06-03 DIAGNOSIS — F17200 Nicotine dependence, unspecified, uncomplicated: Secondary | ICD-10-CM

## 2013-06-03 DIAGNOSIS — E46 Unspecified protein-calorie malnutrition: Secondary | ICD-10-CM | POA: Diagnosis present

## 2013-06-03 DIAGNOSIS — IMO0002 Reserved for concepts with insufficient information to code with codable children: Secondary | ICD-10-CM

## 2013-06-03 DIAGNOSIS — T43591A Poisoning by other antipsychotics and neuroleptics, accidental (unintentional), initial encounter: Secondary | ICD-10-CM | POA: Diagnosis present

## 2013-06-03 DIAGNOSIS — E039 Hypothyroidism, unspecified: Secondary | ICD-10-CM | POA: Diagnosis present

## 2013-06-03 DIAGNOSIS — L0390 Cellulitis, unspecified: Secondary | ICD-10-CM | POA: Diagnosis present

## 2013-06-03 DIAGNOSIS — W57XXXA Bitten or stung by nonvenomous insect and other nonvenomous arthropods, initial encounter: Secondary | ICD-10-CM | POA: Diagnosis present

## 2013-06-03 DIAGNOSIS — Z853 Personal history of malignant neoplasm of breast: Secondary | ICD-10-CM

## 2013-06-03 DIAGNOSIS — E872 Acidosis, unspecified: Secondary | ICD-10-CM | POA: Diagnosis present

## 2013-06-03 DIAGNOSIS — Z803 Family history of malignant neoplasm of breast: Secondary | ICD-10-CM

## 2013-06-03 DIAGNOSIS — G40909 Epilepsy, unspecified, not intractable, without status epilepticus: Secondary | ICD-10-CM | POA: Diagnosis present

## 2013-06-03 DIAGNOSIS — E8809 Other disorders of plasma-protein metabolism, not elsewhere classified: Secondary | ICD-10-CM | POA: Diagnosis present

## 2013-06-03 DIAGNOSIS — L02419 Cutaneous abscess of limb, unspecified: Secondary | ICD-10-CM | POA: Diagnosis present

## 2013-06-03 DIAGNOSIS — F411 Generalized anxiety disorder: Secondary | ICD-10-CM | POA: Diagnosis present

## 2013-06-03 DIAGNOSIS — T424X1A Poisoning by benzodiazepines, accidental (unintentional), initial encounter: Secondary | ICD-10-CM

## 2013-06-03 DIAGNOSIS — Z8049 Family history of malignant neoplasm of other genital organs: Secondary | ICD-10-CM

## 2013-06-03 DIAGNOSIS — F319 Bipolar disorder, unspecified: Secondary | ICD-10-CM | POA: Diagnosis present

## 2013-06-03 DIAGNOSIS — R0902 Hypoxemia: Secondary | ICD-10-CM | POA: Diagnosis present

## 2013-06-03 DIAGNOSIS — J449 Chronic obstructive pulmonary disease, unspecified: Secondary | ICD-10-CM

## 2013-06-03 DIAGNOSIS — F1390 Sedative, hypnotic, or anxiolytic use, unspecified, uncomplicated: Secondary | ICD-10-CM

## 2013-06-03 DIAGNOSIS — F1410 Cocaine abuse, uncomplicated: Secondary | ICD-10-CM | POA: Diagnosis present

## 2013-06-03 DIAGNOSIS — T424X4A Poisoning by benzodiazepines, undetermined, initial encounter: Principal | ICD-10-CM | POA: Diagnosis present

## 2013-06-03 DIAGNOSIS — R079 Chest pain, unspecified: Secondary | ICD-10-CM | POA: Diagnosis present

## 2013-06-03 DIAGNOSIS — T148 Other injury of unspecified body region: Secondary | ICD-10-CM | POA: Diagnosis present

## 2013-06-03 DIAGNOSIS — J441 Chronic obstructive pulmonary disease with (acute) exacerbation: Secondary | ICD-10-CM | POA: Diagnosis present

## 2013-06-03 DIAGNOSIS — L988 Other specified disorders of the skin and subcutaneous tissue: Secondary | ICD-10-CM | POA: Diagnosis present

## 2013-06-03 DIAGNOSIS — Z91199 Patient's noncompliance with other medical treatment and regimen due to unspecified reason: Secondary | ICD-10-CM

## 2013-06-03 DIAGNOSIS — F1994 Other psychoactive substance use, unspecified with psychoactive substance-induced mood disorder: Secondary | ICD-10-CM | POA: Diagnosis present

## 2013-06-03 DIAGNOSIS — Z9119 Patient's noncompliance with other medical treatment and regimen: Secondary | ICD-10-CM

## 2013-06-03 DIAGNOSIS — F17210 Nicotine dependence, cigarettes, uncomplicated: Secondary | ICD-10-CM | POA: Diagnosis present

## 2013-06-03 DIAGNOSIS — L0291 Cutaneous abscess, unspecified: Secondary | ICD-10-CM

## 2013-06-03 HISTORY — DX: Depression, unspecified: F32.A

## 2013-06-03 HISTORY — DX: Hypothyroidism, unspecified: E03.9

## 2013-06-03 HISTORY — DX: Anxiety disorder, unspecified: F41.9

## 2013-06-03 HISTORY — DX: Major depressive disorder, single episode, unspecified: F32.9

## 2013-06-03 HISTORY — DX: Bipolar disorder, unspecified: F31.9

## 2013-06-03 HISTORY — DX: Chronic obstructive pulmonary disease, unspecified: J44.9

## 2013-06-03 HISTORY — DX: Post-traumatic stress disorder, unspecified: F43.10

## 2013-06-03 LAB — COMPREHENSIVE METABOLIC PANEL
Albumin: 3.3 g/dL — ABNORMAL LOW (ref 3.5–5.2)
Alkaline Phosphatase: 88 U/L (ref 39–117)
BUN: 15 mg/dL (ref 6–23)
Potassium: 4.1 mEq/L (ref 3.5–5.1)
Sodium: 137 mEq/L (ref 135–145)
Total Protein: 6.9 g/dL (ref 6.0–8.3)

## 2013-06-03 LAB — URINALYSIS, ROUTINE W REFLEX MICROSCOPIC
Glucose, UA: NEGATIVE mg/dL
Ketones, ur: NEGATIVE mg/dL
Leukocytes, UA: NEGATIVE
Nitrite: NEGATIVE
Specific Gravity, Urine: 1.024 (ref 1.005–1.030)
pH: 6 (ref 5.0–8.0)

## 2013-06-03 LAB — POCT I-STAT 3, ART BLOOD GAS (G3+)
Bicarbonate: 24.2 mEq/L — ABNORMAL HIGH (ref 20.0–24.0)
pCO2 arterial: 45 mmHg (ref 35.0–45.0)
pH, Arterial: 7.337 — ABNORMAL LOW (ref 7.350–7.450)
pO2, Arterial: 65 mmHg — ABNORMAL LOW (ref 80.0–100.0)

## 2013-06-03 LAB — RAPID URINE DRUG SCREEN, HOSP PERFORMED
Amphetamines: NOT DETECTED
Barbiturates: NOT DETECTED
Cocaine: POSITIVE — AB
Opiates: NOT DETECTED
Tetrahydrocannabinol: NOT DETECTED

## 2013-06-03 LAB — MRSA PCR SCREENING: MRSA by PCR: NEGATIVE

## 2013-06-03 LAB — CG4 I-STAT (LACTIC ACID): Lactic Acid, Venous: 0.91 mmol/L (ref 0.5–2.2)

## 2013-06-03 LAB — BASIC METABOLIC PANEL
BUN: 19 mg/dL (ref 6–23)
CO2: 22 mEq/L (ref 19–32)
Calcium: 9.6 mg/dL (ref 8.4–10.5)
GFR calc non Af Amer: >90 mL/min (ref >90)
Glucose, Bld: 91 mg/dL (ref 70–99)
Potassium: 4.2 mEq/L (ref 3.5–5.1)
Sodium: 137 mEq/L (ref 135–145)

## 2013-06-03 LAB — TSH: TSH: 0.8 u[IU]/mL (ref 0.350–4.500)

## 2013-06-03 LAB — CBC
HCT: 43.2 % (ref 36.0–46.0)
Hemoglobin: 14.2 g/dL (ref 12.0–15.0)
MCHC: 32.9 g/dL (ref 30.0–36.0)
MCV: 84.5 fL (ref 78.0–100.0)
RDW: 15.2 % (ref 11.5–15.5)

## 2013-06-03 LAB — OSMOLALITY, URINE: Osmolality, Ur: 856 mOsm/kg (ref 390–1090)

## 2013-06-03 LAB — VALPROIC ACID LEVEL: Valproic Acid Lvl: <10 ug/mL — ABNORMAL LOW (ref 50.0–100.0)

## 2013-06-03 MED ORDER — LEVOTHYROXINE SODIUM 100 MCG IV SOLR
37.5000 ug | Freq: Every day | INTRAVENOUS | Status: DC
  Administered 2013-06-03: 37.5 ug via INTRAVENOUS
  Administered 2013-06-04: 10:00:00 via INTRAVENOUS
  Filled 2013-06-03 (×2): qty 5

## 2013-06-03 MED ORDER — IPRATROPIUM BROMIDE 0.02 % IN SOLN
0.5000 mg | Freq: Once | RESPIRATORY_TRACT | Status: AC
  Administered 2013-06-03: 0.5 mg via RESPIRATORY_TRACT
  Filled 2013-06-03: qty 2.5

## 2013-06-03 MED ORDER — ENOXAPARIN SODIUM 40 MG/0.4ML ~~LOC~~ SOLN
40.0000 mg | SUBCUTANEOUS | Status: DC
  Administered 2013-06-03 – 2013-06-04 (×2): 40 mg via SUBCUTANEOUS
  Filled 2013-06-03 (×2): qty 0.4

## 2013-06-03 MED ORDER — IPRATROPIUM BROMIDE 0.02 % IN SOLN
0.5000 mg | RESPIRATORY_TRACT | Status: DC
  Administered 2013-06-03 (×2): 0.5 mg via RESPIRATORY_TRACT
  Filled 2013-06-03 (×2): qty 2.5

## 2013-06-03 MED ORDER — TIOTROPIUM BROMIDE MONOHYDRATE 18 MCG IN CAPS
18.0000 ug | ORAL_CAPSULE | Freq: Every day | RESPIRATORY_TRACT | Status: DC
  Filled 2013-06-03: qty 5

## 2013-06-03 MED ORDER — ONDANSETRON HCL 4 MG/2ML IJ SOLN: 4.0000 mg | Freq: Four times a day (QID) | INTRAMUSCULAR | Status: DC | PRN

## 2013-06-03 MED ORDER — PNEUMOCOCCAL VAC POLYVALENT 25 MCG/0.5ML IJ INJ
0.5000 mL | INJECTION | INTRAMUSCULAR | Status: AC
  Administered 2013-06-04: 0.5 mL via INTRAMUSCULAR
  Filled 2013-06-03: qty 0.5

## 2013-06-03 MED ORDER — ACETAMINOPHEN 650 MG RE SUPP: 650.0000 mg | Freq: Four times a day (QID) | RECTAL | Status: DC | PRN

## 2013-06-03 MED ORDER — VANCOMYCIN HCL IN DEXTROSE 1-5 GM/200ML-% IV SOLN
1000.0000 mg | Freq: Two times a day (BID) | INTRAVENOUS | Status: DC
  Administered 2013-06-03 – 2013-06-04 (×2): 1000 mg via INTRAVENOUS
  Filled 2013-06-03 (×4): qty 200

## 2013-06-03 MED ORDER — ONDANSETRON HCL 4 MG PO TABS: 4.0000 mg | ORAL_TABLET | Freq: Four times a day (QID) | ORAL | Status: DC | PRN

## 2013-06-03 MED ORDER — SODIUM CHLORIDE 0.9 % IV SOLN
INTRAVENOUS | Status: DC
  Administered 2013-06-03: 150 mL/h via INTRAVENOUS

## 2013-06-03 MED ORDER — FLUOXETINE HCL 20 MG PO CAPS
20.0000 mg | ORAL_CAPSULE | Freq: Two times a day (BID) | ORAL | Status: DC
  Administered 2013-06-03 – 2013-06-04 (×2): 20 mg via ORAL
  Filled 2013-06-03 (×3): qty 1

## 2013-06-03 MED ORDER — METHYLPREDNISOLONE SODIUM SUCC 125 MG IJ SOLR
125.0000 mg | Freq: Once | INTRAMUSCULAR | Status: AC
  Administered 2013-06-03: 125 mg via INTRAVENOUS
  Filled 2013-06-03: qty 2

## 2013-06-03 MED ORDER — ALBUTEROL SULFATE (5 MG/ML) 0.5% IN NEBU
2.5000 mg | INHALATION_SOLUTION | RESPIRATORY_TRACT | Status: DC
  Administered 2013-06-03 (×2): 2.5 mg via RESPIRATORY_TRACT
  Filled 2013-06-03 (×2): qty 0.5

## 2013-06-03 MED ORDER — ALBUTEROL (5 MG/ML) CONTINUOUS INHALATION SOLN
10.0000 mg/h | INHALATION_SOLUTION | Freq: Once | RESPIRATORY_TRACT | Status: AC
  Administered 2013-06-03: 10 mg/h via RESPIRATORY_TRACT
  Filled 2013-06-03: qty 20

## 2013-06-03 MED ORDER — ALBUTEROL SULFATE (5 MG/ML) 0.5% IN NEBU: 2.5000 mg | INHALATION_SOLUTION | RESPIRATORY_TRACT | Status: DC | PRN

## 2013-06-03 MED ORDER — MOMETASONE FURO-FORMOTEROL FUM 200-5 MCG/ACT IN AERO
2.0000 | INHALATION_SPRAY | Freq: Two times a day (BID) | RESPIRATORY_TRACT | Status: DC
Start: 2013-06-03 — End: 2013-06-05
  Administered 2013-06-03 – 2013-06-04 (×2): 2 via RESPIRATORY_TRACT
  Filled 2013-06-03: qty 8.8

## 2013-06-03 MED ORDER — SODIUM CHLORIDE 0.9 % IV BOLUS (SEPSIS)
1000.0000 mL | Freq: Once | INTRAVENOUS | Status: AC
  Administered 2013-06-03: 1000 mL via INTRAVENOUS

## 2013-06-03 MED ORDER — SODIUM CHLORIDE 0.9 % IV SOLN
Freq: Once | INTRAVENOUS | Status: AC
  Administered 2013-06-03: 12:00:00 via INTRAVENOUS

## 2013-06-03 MED ORDER — ACETAMINOPHEN 325 MG PO TABS: 650.0000 mg | ORAL_TABLET | Freq: Four times a day (QID) | ORAL | Status: DC | PRN

## 2013-06-03 MED ORDER — IPRATROPIUM BROMIDE 0.02 % IN SOLN
0.5000 mg | Freq: Three times a day (TID) | RESPIRATORY_TRACT | Status: DC
  Administered 2013-06-04 (×3): 0.5 mg via RESPIRATORY_TRACT
  Filled 2013-06-03 (×3): qty 2.5

## 2013-06-03 MED ORDER — ACLIDINIUM BROMIDE 400 MCG/ACT IN AEPB: 1.0000 | INHALATION_SPRAY | Freq: Two times a day (BID) | RESPIRATORY_TRACT | Status: DC

## 2013-06-03 MED ORDER — VALPROATE SODIUM 500 MG/5ML IV SOLN
500.0000 mg | Freq: Two times a day (BID) | INTRAVENOUS | Status: DC
  Administered 2013-06-04 (×2): 500 mg via INTRAVENOUS
  Filled 2013-06-03 (×4): qty 5

## 2013-06-03 MED ORDER — ALBUTEROL SULFATE (5 MG/ML) 0.5% IN NEBU
2.5000 mg | INHALATION_SOLUTION | Freq: Three times a day (TID) | RESPIRATORY_TRACT | Status: DC
  Administered 2013-06-04 (×3): 2.5 mg via RESPIRATORY_TRACT
  Filled 2013-06-03 (×3): qty 0.5

## 2013-06-03 MED ORDER — VANCOMYCIN HCL IN DEXTROSE 1-5 GM/200ML-% IV SOLN
1000.0000 mg | Freq: Once | INTRAVENOUS | Status: AC
  Administered 2013-06-03: 1000 mg via INTRAVENOUS
  Filled 2013-06-03: qty 200

## 2013-06-03 MED ORDER — ALBUTEROL SULFATE (5 MG/ML) 0.5% IN NEBU
5.0000 mg | INHALATION_SOLUTION | Freq: Once | RESPIRATORY_TRACT | Status: AC
  Administered 2013-06-03: 5 mg via RESPIRATORY_TRACT
  Filled 2013-06-03: qty 1

## 2013-06-03 NOTE — ED Provider Notes (Signed)
CSN: [id number]     Arrival date & time 06/03/13  0746 History   First MD Initiated Contact with Patient 06/03/13 0757     Chief Complaint  Patient presents with  . Chest Pain  . Dizziness  . Leg Swelling   (Consider location/radiation/quality/duration/timing/severity/associated sxs/prior Treatment) HPI Comments: Patient is a smoker with COPD Gold IV p/w increased SOB and bilateral ankle swelling.  Pt states she feels worse than usual because they just got the autopsy result back on her nephew.  States she thinks her ankles are both swollen because she tripped last night.  Pt has been seen now 4 times (8/8, 8/12, 8/25, today)  in ED this month and once by her pulmonologist Dr Sherene Sires (8/22).  States she is unable to take her Carlos American correctly because it is "too much" powder she is supposed to inhale.  States SOB has been gradually worsening.  States she is not on steroids. Subjective fevers last night.  Cough productive of greyish sputum. Notes chest pain, "like someone beating on my chest," this morning, lasted 30 minutes and resolved.  Also notes urinary frequency.   Patient reports she is sleepy because she took 2 xanax this morning - states she took them for the pain in her feet.  Denies taking any other drugs today.   Level 5 caveat for increased sleepiness/decreased responsiveness vs uncooperativeness.    Patient is a 52 y.o. female presenting with chest pain. The history is provided by the patient.  Chest Pain Associated symptoms: cough, fever, nausea, shortness of breath and vomiting   Associated symptoms: no abdominal pain     Past Medical History  Diagnosis Date  . Thyroid disease   . Seizures     epilepsy  . Breast cancer 01/2012    right  . Arthritis   . Asthma    Past Surgical History  Procedure Laterality Date  . Knee surgery      right  . Tonsillectomy    . Ovarian cyst surgery    . Breast lumpectomy with needle localization and axillary sentinel lymph node bx   03/06/2012    Procedure: BREAST LUMPECTOMY WITH NEEDLE LOCALIZATION AND AXILLARY SENTINEL LYMPH NODE BX;  Surgeon: Clovis Pu. Cornett, MD;  Location: [facility];  Service: General;  Laterality: Right;  right breast needle localized lumpectomy and right sentinel lymph node mapping   Family History  Problem Relation Age of Onset  . Heart disease Mother   . Cancer Sister     cervical cancer  . Cancer Brother     colon  . Breast cancer Sister   . Cancer Sister     breast   History  Substance Use Topics  . Smoking status: Current Some Day Smoker -- 0.25 packs/day    Types: Cigarettes  . Smokeless tobacco: Never Used     Comment: less than .25 pack  . Alcohol Use: No   OB History   Grav Para Term Preterm Abortions TAB SAB Ect Mult Living                 Review of Systems  Unable to perform ROS: Other  Constitutional: Positive for fever.  Respiratory: Positive for cough and shortness of breath.   Cardiovascular: Positive for chest pain.  Gastrointestinal: Positive for nausea and vomiting. Negative for abdominal pain, diarrhea, constipation and blood in stool.  Genitourinary: Positive for frequency. Negative for dysuria and urgency.    Allergies  Review of patient's allergies indicates no  known allergies.  Home Medications   Current Outpatient Rx  Name  Route  Sig  Dispense  Refill  . Aclidinium Bromide (TUDORZA PRESSAIR) 400 MCG/ACT AEPB   Inhalation   Inhale 1 puff into the lungs 2 (two) times daily. One twice daily   1 each   11   . albuterol (PROVENTIL HFA;VENTOLIN HFA) 108 (90 BASE) MCG/ACT inhaler   Inhalation   Inhale 2 puffs into the lungs every 6 (six) hours as needed for wheezing or shortness of breath.          . ALPRAZolam (XANAX) 0.5 MG tablet   Oral   Take 1 tablet (0.5 mg total) by mouth at bedtime as needed for sleep.   90 tablet   0   . divalproex (DEPAKOTE) 500 MG DR tablet   Oral   Take 500 mg by mouth 2 (two) times daily.          [name] Kitchen FLUoxetine (PROZAC) 20 MG capsule   Oral   Take 20 mg by mouth 2 (two) times daily.         [name] Kitchen levothyroxine (SYNTHROID, LEVOTHROID) 75 MCG tablet   Oral   Take 75 mcg by mouth daily.         . mometasone-formoterol (DULERA) 200-5 MCG/ACT AERO   Inhalation   Inhale 2 puffs into the lungs daily as needed for wheezing or shortness of breath.         . predniSONE (DELTASONE) 20 MG tablet   Oral   Take 2 tablets (40 mg total) by mouth daily.   10 tablet   0   . predniSONE (DELTASONE) 50 MG tablet   Oral   Take 1 tablet (50 mg total) by mouth daily.   5 tablet   0    BP 113/75  Pulse 110  Temp(Src) 98.6 F (37 C)  Resp 22  SpO2 97% Physical Exam  Nursing note and vitals reviewed. Constitutional: She appears well-developed and well-nourished. She is sleeping and uncooperative. She is easily aroused. No distress.  HENT:  Head: Normocephalic and atraumatic.  Neck: Neck supple.  Cardiovascular: Normal rate, regular rhythm and intact distal pulses.   Pulmonary/Chest: Effort normal. No respiratory distress. She has decreased breath sounds. She has wheezes. She has no rales.  Abdominal: Soft. She exhibits no distension. There is no tenderness. There is no rebound and no guarding.  Musculoskeletal: She exhibits no edema.  Bilateral feet with erythema and tenderness and multiple round hyperpigmented scabbed lesions patient identifies as flea bites.  Left lower extremity erythema extends up left shin.  Left calf tender.  Distal pulses intact.  Pt able to move ankles and toes without difficulty.    Neurological: She is easily aroused.  Skin: She is not diaphoretic.  Psychiatric:  tearful    ED Course  Procedures (including critical care time) Labs Review Labs Reviewed  CBC - Abnormal; Notable for the following:    WBC 17.9 (*)    All other components within normal limits  URINE RAPID DRUG SCREEN (HOSP PERFORMED) - Abnormal; Notable for the following:    Cocaine  POSITIVE (*)    Benzodiazepines POSITIVE (*)    All other components within normal limits  SALICYLATE LEVEL - Abnormal; Notable for the following:    Salicylate Lvl <2.0 (*)    All other components within normal limits  POCT I-STAT 3, BLOOD GAS (G3+) - Abnormal; Notable for the following:    pH, Arterial 7.337 (*)  pO2, Arterial 65.0 (*)    Bicarbonate 24.2 (*)    All other components within normal limits  CULTURE, BLOOD (ROUTINE X 2)  CULTURE, BLOOD (ROUTINE X 2)  PRO B NATRIURETIC PEPTIDE  BASIC METABOLIC PANEL  ETHANOL  ACETAMINOPHEN LEVEL  URINALYSIS, ROUTINE W REFLEX MICROSCOPIC  BLOOD GAS, ARTERIAL  POCT I-STAT TROPONIN I  CG4 I-STAT (LACTIC ACID)   Imaging Review Dg Chest 2 View  06/03/2013   *RADIOLOGY REPORT*  Clinical Data: Chest pain and dizziness.  CHEST - 2 VIEW  Comparison: PA and lateral chest 05/31/2013 and 10/28/2012.  Findings: The chest is hyperexpanded with attenuation of the pulmonary vasculature.  Lungs are clear.  Heart size is normal.  No pneumothorax or pleural fluid.  Surgical clips right breast and axilla are again seen.  IMPRESSION:  1.  No acute finding. 2.  Emphysema.   Original Report Authenticated By: Holley Dexter, M.D.     Date: 06/03/2013  Rate: 11  Rhythm: sinus tachycardia  QRS Axis: normal  Intervals: normal  ST/T Wave abnormalities: nonspecific T wave changes  Conduction Disutrbances:none  Narrative Interpretation:   Old EKG Reviewed: unchanged   8:54 AM Dr Freida Busman made aware of the patient. Erythema of left lower extremity marked by me.  Pt is sleepy but tachcyardic, pt sleeping because she took 2 xanax this morning.   9:15 AM I have asked Dr Freida Busman to also see this patient.  Patient continues to be sleepy, also appears to be in pain.  Nurse found her with a pill bottle (xanax) - she denies taking any additional xanax and nurse notes there are 3 left in bottle (same as when I checked previously).  Pt does not respond when I ask her if  she is depressed or has done something to hurt herself - she stares at me and tears up in response.  I have added tylenol and salicylate level.    MDM   1. Cellulitis   2. COPD exacerbation   3. Benzodiazepine misuse     Patient with hx uncontrolled COPD, pt is a smoker, admits she is unable to take her home meds appropriately.  O2 dropped to 89% on room air during a breathing treatment.  ABG also shows low O2.  Also with cellulitis of lower extremities L>R, source likely "flea bites." Vancomycin given in ED.  Afebrile, WBC elevated - pt has been on steroids recently but denies current use of steroids (unclear when last use was).  Complicating patient's emergency department visit is that she has taken excessive Xanax this morning. She states she has only taken 2 pills. However, she has been sleepy during this visit-she does wake up and does respond to questions appropriately when asked, but then does go back to sleep. Dr. Freida Busman has also seen the patient and she denies suicidal ideation to him.     11:23 AM Admitted to Internal Medicine teaching service for COPD, cellulitis.  MD informed about patient's misuse of xanax this morning and reported chest pain. I do believe the chest pain is related to the COPD.  EKG is unchanged, troponin negative.  Chest pain described is atypical.  Doubt ACS.    Trixie Dredge, PA-C 06/03/13 1230

## 2013-06-03 NOTE — ED Notes (Signed)
CAT finished/taken off with slight >'d aeration noted, pt. placed on 2 lpm n/c sats 99%, RN/PA Chad made aware.

## 2013-06-03 NOTE — ED Notes (Signed)
MD at bedside asking pt questions regarding thoughts of harming self.  Pt not answering questions.  Pt is looking around room.  Pt currently getting a breathing treatment.  MD Freida Busman at bedside for evaluation.

## 2013-06-03 NOTE — ED Notes (Signed)
Pt. due for room air ABG, has been off CAT neb @10mg  that was on O2 and placed on room air X5 min, taken to Vascular lab@ this time, CAT neb to be refilled 3/4 from being tipped over by pt. earlier-Emily Shelva Majestic PA made aware. Plan to restart CAT/obtain room air ABG once pt. back from procedure.

## 2013-06-03 NOTE — ED Notes (Signed)
Dr. Manson Passey notified of patient's condition, and bed request. Changed bed request from regular to telemetry-- Dr. Freida Busman and Trixie Dredge, PA notified of patient's condition-- Trixie Dredge notified Dr. Manson Passey-- bed request changed to step down bed.

## 2013-06-03 NOTE — ED Notes (Signed)
O2 sat 89%-switched to O2.  Pt not talking or answering RN questions.  PA [place] at bedside for evaluation.  Pt has a family member- sister- at bedside but seems disinterested.  Reports pt not normal, but continues regarding private material.  Reports "she just said her chest hurt."

## 2013-06-03 NOTE — ED Notes (Signed)
Internal Medicine consult at bedside for evaluation.

## 2013-06-03 NOTE — ED Notes (Signed)
Pt c/o bil ankle swelling and pain, dizziness and sob since last night.  C/o chest pain last night, but denies presently.

## 2013-06-03 NOTE — ED Notes (Signed)
When RN walked into pt room.  Pt had pill bottle of Xanax open and was trying to get pills out.  Pt denied.  RN removed pills and placed at RN desk for right now.  MD made aware.

## 2013-06-03 NOTE — Progress Notes (Signed)
VASCULAR LAB PRELIMINARY  PRELIMINARY  PRELIMINARY  PRELIMINARY  Bilateral lower extremity venous Dopplers completed.    Preliminary report:  There is no DVT or SVT noted in the bilateral lower extremities.  Eulala Newcombe, RVT 06/03/2013, 9:59 AM

## 2013-06-03 NOTE — H&P (Signed)
Date: 06/03/2013               Patient Name:  Courtney Terry MRN: [id number]  DOB: 06-23-1961 Age / Sex: 52 y.o., female   PCP: Ignacia Bayley, MD         Medical Service: Internal Medicine Teaching Service         Attending Physician: Dr. Debe Coder    First Contact: Dr. Johna Roles Pager: [phone]  Second Contact: Dr. Dierdre Searles Pager: [phone]       After Hours (After 5p/  First Contact Pager: [phone]  weekends / holidays): Second Contact Pager: [phone]   Chief Complaint: Benzo overdose  History of Present Illness:  The patient is a 52 yo W, history of COPD (GOLD 4), hypothyroidism, seizure disorder, presenting with altered mental status.  Due to the patient's mental status, much of the HPI was obtained via chart review and report from the patient's sister.  Per the patient's sister, the patient called EMS this morning due to vomiting, dyspnea, and chest pain.  On arrival in the ED, the patient was found to be dyspneic, with O2 sats in the 80's.  She was also found to be somnolent, though arouseable to verbal or tactile stimulation.  When asked, the patient reports taking 2 Xanax last night, due to stress surrounding the death of her nephew.  However, she filled a prescription for 90 Xanax 0.5 mg tabs, ordered 05/28/13 (I called pharmacy, [facility] on [address], to confirm that 90 were dispensed), and ED intake forms indicate that she has a bottle with only 3 tabs remaining.  She reportedly denied suicidal ideation to the ED provider.  The patient has a history of COPD, GOLD stage 4, with several visits to the ED for dyspnea, most recently 8/25, where she was given a 5-day course of prednisone 50 mg daily.  She is a current smoker, and prior notes indicate non-compliance with her home inhalers.  Currently, she denies dyspnea, though unsure of accuracy due to somnolence.    Meds: No current facility-administered medications for this encounter.   Current Outpatient Prescriptions  Medication  Sig Dispense Refill  . Aclidinium Bromide (TUDORZA PRESSAIR) 400 MCG/ACT AEPB Inhale 1 puff into the lungs 2 (two) times daily. One twice daily  1 each  11  . albuterol (PROVENTIL HFA;VENTOLIN HFA) 108 (90 BASE) MCG/ACT inhaler Inhale 2 puffs into the lungs every 6 (six) hours as needed for wheezing or shortness of breath.       . ALPRAZolam (XANAX) 0.5 MG tablet Take 1 tablet (0.5 mg total) by mouth at bedtime as needed for sleep.  90 tablet  0  . divalproex (DEPAKOTE) 500 MG DR tablet Take 1,000 mg by mouth daily.       [name] Kitchen FLUoxetine (PROZAC) 20 MG capsule Take 20 mg by mouth 2 (two) times daily.      [name] Kitchen levothyroxine (SYNTHROID, LEVOTHROID) 75 MCG tablet Take 75 mcg by mouth daily.      . mometasone-formoterol (DULERA) 200-5 MCG/ACT AERO Inhale 2 puffs into the lungs daily as needed for wheezing or shortness of breath.      . Multiple Vitamin (MULTIVITAMIN WITH MINERALS) TABS tablet Take 1 tablet by mouth daily.      . predniSONE (DELTASONE) 20 MG tablet Take 2 tablets (40 mg total) by mouth daily.  10 tablet  0    Allergies: Allergies as of 06/03/2013  . (No Known Allergies)   Past Medical History  Diagnosis Date  .  Thyroid disease   . Seizures     epilepsy  . Breast cancer 01/2012    s/p lumpectomy of T1N0 R stage 1 lobular breast cancer on 03/06/12.  Pt was supposed to follow-up with oncology, but has not done so.  . Arthritis   . Asthma    Past Surgical History  Procedure Laterality Date  . Knee surgery      right  . Tonsillectomy    . Ovarian cyst surgery    . Breast lumpectomy with needle localization and axillary sentinel lymph node bx  03/06/2012    Procedure: BREAST LUMPECTOMY WITH NEEDLE LOCALIZATION AND AXILLARY SENTINEL LYMPH NODE BX;  Surgeon: Clovis Pu. Cornett, MD;  Location: [facility];  Service: General;  Laterality: Right;  right breast needle localized lumpectomy and right sentinel lymph node mapping   Family History  Problem Relation Age of Onset    . Heart disease Mother   . Cancer Sister     cervical cancer  . Cancer Brother     colon  . Breast cancer Sister   . Cancer Sister     breast   History   Social History  . Marital Status: Widowed    Spouse Name: N/A    Number of Children: N/A  . Years of Education: N/A   Occupational History  . Not on file.   Social History Main Topics  . Smoking status: Current Some Day Smoker -- 0.25 packs/day    Types: Cigarettes  . Smokeless tobacco: Never Used     Comment: less than .25 pack  . Alcohol Use: No  . Drug Use: Yes    Special: "Crack" cocaine, Cocaine     Comment: Patient last used cocaine at Christmas  . Sexual Activity: Yes   Other Topics Concern  . Not on file   Social History Narrative  . No narrative on file    Review of Systems: Level 5 caveat, unable to obtain due to mental status  Physical Exam: Blood pressure 107/77, pulse 96, temperature 98 F (36.7 C), temperature source Rectal, resp. rate 21, SpO2 97.00%. General: lying in bed, drowsy, but can awaken to answer questions HEENT: PERRL, EOMI though limited by participation, oropharynx clear and non-erythematous Neck: supple, no lymphadenopathy Lungs: normal work of respiration, no wheezes appreciated Heart: regular rate and rhythm, no murmurs, gallops, or rubs Abdomen: soft, mild epigastric ttp, non-distended, normal bowel sounds, no guarding or rebound tenderness Extremities: bilateral legs with multiple hyperpigmented papules, along with erythema and mild edema, tender to palpation Neurologic: lying in bed, responsive to verbal and tactile stimulation, oriented to "hospital", but not city or time, moves all 4 extremities spontaneously   Lab results: Basic Metabolic Panel:  Recent Labs  16/10/96 0757  NA 137  K 4.2  CL 101  CO2 22  GLUCOSE 91  BUN 19  CREATININE 0.78  CALCIUM 9.6   CBC:  Recent Labs  06/03/13 0757  WBC 17.9*  HGB 14.2  HCT 43.2  MCV 84.5  PLT 260    BNP:  Recent Labs  06/03/13 0835  PROBNP 94.4    Alcohol Level:  Recent Labs  06/03/13 0924  ETH <11   Urinalysis:  Recent Labs  05/31/13 1217 06/03/13 1108  COLORURINE YELLOW YELLOW  LABSPEC 1.031* 1.024  PHURINE 5.0 6.0  GLUCOSEU NEGATIVE NEGATIVE  HGBUR NEGATIVE NEGATIVE  BILIRUBINUR NEGATIVE NEGATIVE  KETONESUR NEGATIVE NEGATIVE  PROTEINUR NEGATIVE NEGATIVE  UROBILINOGEN 0.2 1.0  NITRITE NEGATIVE NEGATIVE  LEUKOCYTESUR TRACE* NEGATIVE    Imaging results:  Dg Chest 2 View  06/03/2013   *RADIOLOGY REPORT*  Clinical Data: Chest pain and dizziness.  CHEST - 2 VIEW  Comparison: PA and lateral chest 05/31/2013 and 10/28/2012.  Findings: The chest is hyperexpanded with attenuation of the pulmonary vasculature.  Lungs are clear.  Heart size is normal.  No pneumothorax or pleural fluid.  Surgical clips right breast and axilla are again seen.  IMPRESSION:  1.  No acute finding. 2.  Emphysema.   Original Report Authenticated By: Holley Dexter, M.D.    Other results: EKG: Sinus tachycardia, enlarged P waves in inferior leads, no acute ST abnormalities  Assessment & Plan by Problem:  # Benzodiazepine overdose - the patient presents with an altered level of consciousness, with 60 tabs of Xanax 0.5 unaccounted for in her prescription bottle.  Labs are also notable for cocaine on UDS, but otherwise acetaminophen, salicylate, and ethanol levels negative.  After speaking with poison control, given her age and history of seizures, will not use flumazenil at this time, and will focus on supportive care.  Patient currently somnolent, but arouseable, hemodynamically stable -continue NS @ 150 -monitor vitals on stepdown unit -if patient becomes unresponsive or unable to protect airway, will consult PCCM  # COPD - The patient has a history of COPD, GOLD stage IV.  She has been seen several times this month for dyspnea, most recently 3 days PTA, given prednisone 50 mg x5 days.   Patient noted to have dyspnea earlier today, though on my exam is breathing comfortably, with no wheezes on lung exam.  Unclear if patient has an acute component of COPD exacerbation on her severe chronic disease. -duonebs q4 if pt able to tolerate -s/p solumedrol 125 mg in ED, will start pred 40 tomorrow -will hold off on antibiotics for COPD exacerbation, since exacerbation status is unclear  # ?cellulitis - the patient has multiple hyperpigmented papules on her legs, as well as erythema and tenderness of her legs.  Papules could represent insect bites (per patient report) vs IV marks (given substance abuse), and may have been the nidus for her cellulitis. -continue vanc, can likely transition to PO soon when patient tolerating -area demarkated with a marker, will monitor daily  # Chest pain - The patient's sister reports that the patient noted chest pain during a telephone call to her this morning.  The patient currently denies cp, though unsure of accuracy due to mental status.  Initial troponin and EKG negative.  She has the potential to develop cocaine-induced vasospasm.  ACS less likely, given relatively young age, lack of risk factors (only tobacco). -cycle trop x3  # Elevated Anion Gap - unclear etiology.  Glucose, salicylate, lactic acid level, urine ketones all negative.  May represent mild starvation ketoacidosis. -recheck BMET -check Serum osms as screening for atypical alcohols -repeat lactic acid  # Seizure disorder - pt has epilepsy listed on her history.  She is currently on Depakote. -check depakote level -continue depakote, IV while NPO  # Hypothyroidism - chronic, stable -check TSH -continue Synthroid, IV while NPO  Dispo: Disposition is deferred at this time, awaiting improvement of current medical problems. Anticipated discharge in approximately 2-3 day(s).   The patient does have a current PCP (Mavis Manning Charity, MD) and does not need an [facility] hospital follow-up  appointment after discharge.  Signed: Linward Headland, MD 06/03/2013, 12:10 PM

## 2013-06-03 NOTE — ED Notes (Signed)
Pt having trouble during assessment staying awake.  Pt continues to fall asleep during it.  Pt reports she fell down the steps yesterday and c/o bilateral ankle pain.  Pt has swelling and redness noted to left ankle.  Pt reports she took two pain pills (prescription handed to MD was Xanax) prior to arrival and that is why it is hard for her to stay awake.  MD at bedside for evaluation.

## 2013-06-03 NOTE — ED Notes (Signed)
Pt tired upon arrival back to room.  Pt arousable to voice.  Pt not answering questions about where she is and name.  Pt responsive to pain.  RT at bedside for blood gas.

## 2013-06-03 NOTE — ED Notes (Signed)
Lactic acid results called to nurse, Patrick North

## 2013-06-03 NOTE — ED Notes (Signed)
Alprazolam 0.5mg  -- rx written on 05/28/13 by Dr. Sherene Sires -- 30 tablets. (1 tablet at bedtime as needed)  Internal medicine resident notified

## 2013-06-03 NOTE — ED Notes (Signed)
Sister requesting a letter to be written by MD that pt is going to be admitted into hospital d/t having to give letter to pts lawyer.  PA Chad made aware.

## 2013-06-03 NOTE — Progress Notes (Signed)
ANTIBIOTIC CONSULT NOTE - INITIAL  Pharmacy Consult for Vancomycin Indication: cellulitis  No Known Allergies  Patient Measurements: Height: 5\' 11"  (180.3 cm) Weight: 142 lb (64.411 kg) IBW/kg (Calculated) : 70.8  Vital Signs: Temp: 98 F (36.7 C) (08/28 1035) Temp src: Rectal (08/28 1035) BP: 107/77 mmHg (08/28 1151) Pulse Rate: 96 (08/28 1151) Intake/Output from previous day:   Intake/Output from this shift:    Labs:  Recent Labs  06/03/13 0757  WBC 17.9*  HGB 14.2  PLT 260  CREATININE 0.78   Estimated Creatinine Clearance: 84.6 ml/min (by C-G formula based on Cr of 0.78). No results found for this basename: Rolm Gala, VANCORANDOM, GENTTROUGH, GENTPEAK, GENTRANDOM, TOBRATROUGH, TOBRAPEAK, TOBRARND, AMIKACINPEAK, AMIKACINTROU, AMIKACIN,  in the last 72 hours   Microbiology: Recent Results (from the past 720 hour(s))  URINE CULTURE     Status: None   Collection Time    05/31/13 12:17 PM      Result Value Range Status   Specimen Description URINE, CLEAN CATCH   Final   Special Requests NONE   Final   Culture  Setup Time     Final   Value: 06/01/2013 07:29     Performed at [facility] Count     Final   Value: NO GROWTH     Performed at [facility]   Culture     Final   Value: NO GROWTH     Performed at [facility]   Report Status 06/02/2013 FINAL   Final    Medical History: Past Medical History  Diagnosis Date  . Thyroid disease   . Seizures     epilepsy  . Breast cancer 01/2012    s/p lumpectomy of T1N0 R stage 1 lobular breast cancer on 03/06/12.  Pt was supposed to follow-up with oncology, but has not done so.  . Arthritis   . Asthma     Medications:  See electronic med rec  Assessment: 52 y.o. female presents with alprazolam overdose. Appears that pt took about 20 alprazolam 0.5mg  tablets today (script filled 05/28/13 with 30 tablets and pt was supposed to be taking 1 tablet qhsprn - bottle had 3  tablets left in it today). Urine tox screen also positive for cocaine.  Pt with ?cellulitis - to continue vancomycin with low threshold to change to po antibiotic soon per MD note. Vanc 1gm IV given ~0930 in ED. Pt with nl SCr, est CrCl 85 ml/min.  Goal of Therapy:  Vancomycin trough level 10-15 mcg/ml  Plan:  1. Vancomycin 1gm IV q12h. 2. Will f/u microbiological data, renal function, trough prn  Christoper Fabian, PharmD, BCPS Clinical pharmacist, pager [phone] 06/03/2013,2:22 PM

## 2013-06-04 DIAGNOSIS — T424X4A Poisoning by benzodiazepines, undetermined, initial encounter: Principal | ICD-10-CM

## 2013-06-04 DIAGNOSIS — J449 Chronic obstructive pulmonary disease, unspecified: Secondary | ICD-10-CM

## 2013-06-04 DIAGNOSIS — F1994 Other psychoactive substance use, unspecified with psychoactive substance-induced mood disorder: Secondary | ICD-10-CM

## 2013-06-04 DIAGNOSIS — F17200 Nicotine dependence, unspecified, uncomplicated: Secondary | ICD-10-CM

## 2013-06-04 DIAGNOSIS — F329 Major depressive disorder, single episode, unspecified: Secondary | ICD-10-CM

## 2013-06-04 DIAGNOSIS — T43591A Poisoning by other antipsychotics and neuroleptics, accidental (unintentional), initial encounter: Secondary | ICD-10-CM

## 2013-06-04 LAB — CBC
HCT: 39 % (ref 36.0–46.0)
Hemoglobin: 13 g/dL (ref 12.0–15.0)
MCH: 28.1 pg (ref 26.0–34.0)
MCHC: 33.3 g/dL (ref 30.0–36.0)
MCV: 84.4 fL (ref 78.0–100.0)
Platelets: 264 10*3/uL (ref 150–400)
RBC: 4.62 MIL/uL (ref 3.87–5.11)
RDW: 15.6 % — ABNORMAL HIGH (ref 11.5–15.5)
WBC: 15.8 10*3/uL — ABNORMAL HIGH (ref 4.0–10.5)

## 2013-06-04 LAB — COMPREHENSIVE METABOLIC PANEL
ALT: 8 U/L (ref 0–35)
AST: 11 U/L (ref 0–37)
Albumin: 3 g/dL — ABNORMAL LOW (ref 3.5–5.2)
CO2: 25 mEq/L (ref 19–32)
Calcium: 9 mg/dL (ref 8.4–10.5)
GFR calc non Af Amer: >90 mL/min (ref >90)
Sodium: 142 mEq/L (ref 135–145)
Total Protein: 6.4 g/dL (ref 6.0–8.3)

## 2013-06-04 MED ORDER — DOXYCYCLINE HYCLATE 100 MG PO CAPS: 100.0000 mg | ORAL_CAPSULE | Freq: Two times a day (BID) | ORAL | Status: DC

## 2013-06-04 NOTE — H&P (Signed)
IM ATTENDING H&P  Date: 06/04/2013  Patient name: Courtney Terry  Medical record number: [id number]  Date of birth: July 04, 1961   This patient has been seen and the plan of care was discussed with the house staff. Please see their note for complete details. I concur with their findings with the following additions/corrections:  I saw Courtney Terry and confirmed the history.  Briefly, Courtney Terry is a 52yo woman with PMH of severe COPD, hypothyroidism and seizure disorder who presented to with AMS.  Her sister called EMS on the morning of admission due to vomiting, SOB and chest pain.  On arrival to the ED, she was dyspneic, O2 sats in the 80s and somnolent.  When I saw her she was alert and oriented.  She reports again only taking two Xanax (0.5mg ) which were recently given to her by her pulmonary doctor for severe anxiety.  When I asked her what happened to the rest of the pills, she now tells me that she gave them away to a friend who needed them.  She also told me that the pharmacy only dispensed 30 pills, however, my resident Dr. Manson Passey called the pharmacy and confirmed 90 were dispensed.  In the ED, only 3 tablets were remaining.  It remains unclear how many she took.  She reports having increased anxiety due to her chronic dyspnea and COPD.  She reports being scared to go to sleep.  She notes that even after taking the xanax, she was "up all night" prior to coming to the ED and that is why she slept most of the day.  Regardless, she reports being back to baseline today.  She has some mild tachycardia, but otherwise she is breathing comfortably.  She has decreased breath sounds throughout.  She has multiple hyperpigmented papules on the legs which she attributes to bug bites.  She has a history of using crack cocaine.  Her labs revealed an elevated WBC of 17.9, only trace LE on UA.  CXR showed only emphysema.    For her presumed overdose: She is back to baseline, did not require flumazenil.  Psychiatry  consult pending COPD: s/p solumedrol in the ED (can explain elevated WBC), duonebs q 4 hours.  Bug bites with mild cellulitis: On vancomycin, can transition to bactrim or doxycycline today Chest pain: appears to be resolved Elevated AG: workup is pending  Other issues per resident note.   Inez Catalina, MD 06/04/2013, 2:08 PM
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
## 2013-06-04 NOTE — Care Management Note (Unsigned)
    Page 1 of 1   06/04/2013     5:09:58 PM   CARE MANAGEMENT NOTE 06/04/2013  Patient:  Courtney Terry, Courtney Terry   Account Number:  [id number]  Date Initiated:  06/04/2013  Documentation initiated by:  Mazzie Brodrick  Subjective/Objective Assessment:   PT ADM ON 06/03/13 WITH BENZOS OD.  PTA, PT INDEPENDENT OF ADLS.     Action/Plan:   CASE MANAGEMENT WILL FOLLOW FOR DISCHARGE PLANS AS PT PROGRESSES.   Anticipated DC Date:  06/06/2013   Anticipated DC Plan:  HOME/SELF CARE      DC Planning Services  CM consult      Choice offered to / List presented to:             Status of service:  In process, will continue to follow Medicare Important Message given?   (If response is "NO", the following Medicare IM given date fields will be blank) Date Medicare IM given:   Date Additional Medicare IM given:    Discharge Disposition:    Per UR Regulation:  Reviewed for med. necessity/level of care/duration of stay  If discussed at Long Length of Stay Meetings, dates discussed:    Comments:

## 2013-06-04 NOTE — Progress Notes (Signed)
Subjective:  Pt seen and examined. No acute events overnight. Pt with no CP, palpitations, dyspnea, abdominal pain, nausea, vomiting, or somnolence.   Objective: Vital signs in last 24 hours: Filed Vitals:   06/04/13 0850 06/04/13 1200 06/04/13 1400 06/04/13 1500  BP: 95/67 124/65 132/77 132/77  Pulse: 105 95 90 94  Temp: 98.6 F (37 C) 98.5 F (36.9 C)  98.6 F (37 C)  TempSrc:    Oral  Resp: 21 18 14 13   Height:      Weight:      SpO2: 100% 100% 95% 98%   Weight change:   Intake/Output Summary (Last 24 hours) at 06/04/13 1657 Last data filed at 06/04/13 1600  Gross per 24 hour  Intake   1280 ml  Output   1200 ml  Net     80 ml   General: sitting up in bed, no acute distress  HEENT: PERRL, EOMI though limited by participation, oropharynx clear and non-erythematous Neck: supple, no lymphadenopathy Lungs: normal work of respiration Heart: regular rate and rhythm, no murmurs, gallops, or rubs Abdomen: soft, non-distended, normal bowel sounds, no guarding or rebound tenderness  Extremities: bilateral legs with multiple hyperpigmented papules, along with erythema and mild edema, tender to palpation Neurologic: Alert and orientated x 3  Lab Results: Basic Metabolic Panel:  Recent Labs Lab 06/03/13 1427 06/04/13 0450  NA 137 142  K 4.1 4.0  CL 104 107  CO2 24 25  GLUCOSE 118* 92  BUN 15 20  CREATININE 0.60 0.61  CALCIUM 8.8 9.0   Liver Function Tests:  Recent Labs Lab 06/03/13 1427 06/04/13 0450  AST 14 11  ALT 9 8  ALKPHOS 88 114  BILITOT 0.7 0.2*  PROT 6.9 6.4  ALBUMIN 3.3* 3.0*   No results found for this basename: LIPASE, AMYLASE,  in the last 168 hours No results found for this basename: AMMONIA,  in the last 168 hours CBC:  Recent Labs Lab 05/31/13 1024 06/03/13 0757 06/04/13 0450  WBC 11.6* 17.9* 15.8*  NEUTROABS 7.7  --   --   HGB 13.8 14.2 13.0  HCT 42.2 43.2 39.0  MCV 85.1 84.5 84.4  PLT 264 260 264   Cardiac Enzymes: No  results found for this basename: CKTOTAL, CKMB, CKMBINDEX, TROPONINI,  in the last 168 hours BNP:  Recent Labs Lab 06/03/13 0835  PROBNP 94.4   D-Dimer: No results found for this basename: DDIMER,  in the last 168 hours CBG: No results found for this basename: GLUCAP,  in the last 168 hours Hemoglobin A1C: No results found for this basename: HGBA1C,  in the last 168 hours Fasting Lipid Panel: No results found for this basename: CHOL, HDL, LDLCALC, TRIG, CHOLHDL, LDLDIRECT,  in the last 168 hours Thyroid Function Tests:  Recent Labs Lab 06/03/13 1427  TSH 0.800   Coagulation: No results found for this basename: LABPROT, INR,  in the last 168 hours Anemia Panel: No results found for this basename: VITAMINB12, FOLATE, FERRITIN, TIBC, IRON, RETICCTPCT,  in the last 168 hours Urine Drug Screen: Drugs of Abuse     Component Value Date/Time   LABOPIA NONE DETECTED 06/03/2013 1108   COCAINSCRNUR POSITIVE* 06/03/2013 1108   LABBENZ POSITIVE* 06/03/2013 1108   AMPHETMU NONE DETECTED 06/03/2013 1108   THCU NONE DETECTED 06/03/2013 1108   LABBARB NONE DETECTED 06/03/2013 1108    Alcohol Level:  Recent Labs Lab 06/03/13 0924  ETH <11   Urinalysis:  Recent Labs Lab 05/31/13 1217  06/03/13 1108  COLORURINE YELLOW YELLOW  LABSPEC 1.031* 1.024  PHURINE 5.0 6.0  GLUCOSEU NEGATIVE NEGATIVE  HGBUR NEGATIVE NEGATIVE  BILIRUBINUR NEGATIVE NEGATIVE  KETONESUR NEGATIVE NEGATIVE  PROTEINUR NEGATIVE NEGATIVE  UROBILINOGEN 0.2 1.0  NITRITE NEGATIVE NEGATIVE  LEUKOCYTESUR TRACE* NEGATIVE   Misc. Labs:   Micro Results: Recent Results (from the past 240 hour(s))  URINE CULTURE     Status: None   Collection Time    05/31/13 12:17 PM      Result Value Range Status   Specimen Description URINE, CLEAN CATCH   Final   Special Requests NONE   Final   Culture  Setup Time     Final   Value: 06/01/2013 07:29     Performed at [facility] Count     Final   Value: NO  GROWTH     Performed at [facility]   Culture     Final   Value: NO GROWTH     Performed at [facility]   Report Status 06/02/2013 FINAL   Final  CULTURE, BLOOD (ROUTINE X 2)     Status: None   Collection Time    06/03/13  9:15 AM      Result Value Range Status   Specimen Description BLOOD RIGHT ANTECUBITAL   Final   Special Requests BOTTLES DRAWN AEROBIC ONLY   Final   Culture  Setup Time     Final   Value: 06/03/2013 15:02     Performed at [facility]   Culture     Final   Value:        BLOOD CULTURE RECEIVED NO GROWTH TO DATE CULTURE WILL BE HELD FOR 5 DAYS BEFORE ISSUING A FINAL NEGATIVE REPORT     Performed at [facility]   Report Status PENDING   Incomplete  CULTURE, BLOOD (ROUTINE X 2)     Status: None   Collection Time    06/03/13  9:20 AM      Result Value Range Status   Specimen Description BLOOD LEFT ANTECUBITAL   Final   Special Requests BOTTLES DRAWN AEROBIC ONLY   Final   Culture  Setup Time     Final   Value: 06/03/2013 15:02     Performed at [facility]   Culture     Final   Value:        BLOOD CULTURE RECEIVED NO GROWTH TO DATE CULTURE WILL BE HELD FOR 5 DAYS BEFORE ISSUING A FINAL NEGATIVE REPORT     Performed at [facility]   Report Status PENDING   Incomplete  MRSA PCR SCREENING     Status: None   Collection Time    06/03/13  4:12 PM      Result Value Range Status   MRSA by PCR NEGATIVE  NEGATIVE Final   Comment:            The GeneXpert MRSA Assay (FDA     approved for NASAL specimens     only), is one component of a     comprehensive MRSA colonization     surveillance program. It is not     intended to diagnose MRSA     infection nor to guide or     monitor treatment for     MRSA infections.   Studies/Results: Dg Chest 2 View  06/03/2013   *RADIOLOGY REPORT*  Clinical Data: Chest pain and dizziness.  CHEST -  2 VIEW  Comparison: PA and lateral chest 05/31/2013 and 10/28/2012.   Findings: The chest is hyperexpanded with attenuation of the pulmonary vasculature.  Lungs are clear.  Heart size is normal.  No pneumothorax or pleural fluid.  Surgical clips right breast and axilla are again seen.  IMPRESSION:  1.  No acute finding. 2.  Emphysema.   Original Report Authenticated By: Holley Dexter, M.D.   Medications: I have reviewed the patient's current medications. Scheduled Meds: . ipratropium  0.5 mg Nebulization TID   And  . albuterol  2.5 mg Nebulization TID  . enoxaparin (LOVENOX) injection  40 mg Subcutaneous Q24H  . FLUoxetine  20 mg Oral BID  . levothyroxine  37.5 mcg Intravenous Daily  . mometasone-formoterol  2 puff Inhalation BID  . valproate sodium  500 mg Intravenous Q12H  . vancomycin  1,000 mg Intravenous Q12H   Continuous Infusions:  PRN Meds:.acetaminophen, acetaminophen, albuterol, ondansetron (ZOFRAN) IV, ondansetron Assessment/Plan: Principal Problem:   Benzodiazepine overdose Active Problems:   COPD IV with reversibility/ still smoking   Smoker   Cellulitis  # Benzodiazepine overdose - no AMS; the patient presents with an altered level of consciousness, with 60 tabs of Xanax 0.5 unaccounted for in her prescription bottle. Labs are also notable for cocaine on UDS, but otherwise acetaminophen, salicylate, and ethanol levels negative. After speaking with poison control, given her age and history of seizures, will not use flumazenil at this time, and will focus on supportive care. Patient currently somnolent, but arouseable, hemodynamically stable  -continue NS @ 150  -monitor vitals on stepdown unit  -if patient becomes unresponsive or unable to protect airway, will consult PCCM  -Psychiatry consult per Dr. Lolly Mustache - No evidence of imminent risk to self or others at present.   Patient does not meet criteria for psychiatric inpatient admission. Supportive therapy provided about ongoing stressors. Discussed crisis plan, support from social network,  calling 911, coming to the Emergency Department, and calling Suicide Hotline. Outpatient referral for substance abuse program and medication management for her depression   # COPD - The patient has a history of COPD, GOLD stage IV. She has been seen several times this month for dyspnea, most recently 3 days PTA, given prednisone 50 mg x5 days. Patient noted to have dyspnea earlier today, though on my exam is breathing comfortably, with no wheezes on lung exam. Unclear if patient has an acute component of COPD exacerbation on her severe chronic disease.  -duonebs q4 if pt able to tolerate  -s/p solumedrol 125 mg in ED -will hold off on antibiotics for COPD exacerbation, since exacerbation status is unclear   # ?cellulitis - the patient has multiple hyperpigmented papules on her legs, as well as erythema and tenderness of her legs. Papules could represent insect bites (per patient report) vs IV marks (given substance abuse), and may have been the nidus for her cellulitis.  -continue vanc d/c on PO doxycycline for 6 days   -area demarkated with a marker, will monitor daily   # Chest pain - The patient's sister reports that the patient noted chest pain during a telephone call to her this morning. The patient currently denies cp, though unsure of accuracy due to mental status. Initial troponin and EKG negative. She has the potential to develop cocaine-induced vasospasm. ACS less likely, given relatively young age, lack of risk factors (only tobacco).  -cycle trop x3 - negative  # Elevated Anion Gap - resolved, AG 10 unclear etiology. Glucose, salicylate, lactic  acid level, urine ketones all negative. May represent mild starvation ketoacidosis. -check Serum osms as screening for atypical alcohols -normal  -repeat lactic acid -normal   # Seizure disorder - pt has epilepsy listed on her history. She is currently on Depakote.  -check depakote level - low, pt states she has not been taking as she  should -continue depakote, IV while NPO   # Hypothyroidism - chronic, stable  -check TSH - normal  -continue Synthroid, IV while NPO      Dispo: Disposition is deferred at this time, awaiting improvement of current medical problems.  Anticipated discharge is today.   The patient does have a current PCP (Mavis Manning Charity, MD) and does need an [facility] hospital follow-up appointment after discharge.  The patient does have transportation limitations that hinder transportation to clinic appointments.  .Services Needed at time of discharge: Y = Yes, Blank = No PT:   OT:   RN:   Equipment:   Other:     LOS: 1 day   Otis Brace, MD 06/04/2013, 4:57 PM

## 2013-06-05 NOTE — Discharge Summary (Signed)
Name: Courtney Terry MRN: [id number] DOB: [birth year] 52 y.o. PCP: Courtney Bayley, MD  Date of Admission: 06/03/2013  7:57 AM Date of Discharge: 06/04/2013 Attending Physician: Debe Coder, MD  Discharge Diagnosis: 1. Principal Problem:   Benzodiazepine overdose Active Problems:   COPD IV with reversibility/ still smoking   Smoker   Cellulitis  Discharge Medications:   Medication List    STOP taking these medications       ALPRAZolam 0.5 MG tablet  Commonly known as:  XANAX      TAKE these medications       Aclidinium Bromide 400 MCG/ACT Aepb  Commonly known as:  TUDORZA PRESSAIR  Inhale 1 puff into the lungs 2 (two) times daily. One twice daily     albuterol 108 (90 BASE) MCG/ACT inhaler  Commonly known as:  PROVENTIL HFA;VENTOLIN HFA  Inhale 2 puffs into the lungs every 6 (six) hours as needed for wheezing or shortness of breath.     divalproex 500 MG DR tablet  Commonly known as:  DEPAKOTE  Take 1,000 mg by mouth daily.     doxycycline 100 MG capsule  Commonly known as:  VIBRAMYCIN  Take 1 capsule (100 mg total) by mouth 2 (two) times daily. One po bid x 6 days     FLUoxetine 20 MG capsule  Commonly known as:  PROZAC  Take 20 mg by mouth 2 (two) times daily.     levothyroxine 75 MCG tablet  Commonly known as:  SYNTHROID, LEVOTHROID  Take 75 mcg by mouth daily.     mometasone-formoterol 200-5 MCG/ACT Aero  Commonly known as:  DULERA  Inhale 2 puffs into the lungs daily as needed for wheezing or shortness of breath.     multivitamin with minerals Tabs tablet  Take 1 tablet by mouth daily.     predniSONE 20 MG tablet  Commonly known as:  DELTASONE  Take 2 tablets (40 mg total) by mouth daily.        Disposition and follow-up:   [name] was discharged from [facility] in Stable condition.  At the hospital follow up visit please address:  1.  Resolution of Cellulitis after 7 day antibiotic course      Resolution of  leukocystosis      Resolution of COPD exacerbation - completing prednisone course        Sub-therapeutic depakote levels         Depression & Substance Abuse  - not compliant with anti-depressant medication        Malnutrition - low albumin  2.  Labs / imaging needed at time of follow-up: CBC, depakote levels  3.  Pending labs/ test needing follow-up: final blood culture report  Follow-up Appointments:     Follow-up Information   Follow up with Courtney Bayley, MD. Call on 06/08/2013. (Call Tuesday for appointment in 1 week)    Specialty:  Nurse Practitioner   Contact information:   [address] [place] [address] [phone]       Discharge Instructions:   Consultations:   Psychiatry - Courtney Sharper, MD   Procedures Performed:  Dg Chest 2 View  06/03/2013   *RADIOLOGY REPORT*  Clinical Data: Chest pain and dizziness.  CHEST - 2 VIEW  Comparison: PA and lateral chest 05/31/2013 and 10/28/2012.  Findings: The chest is hyperexpanded with attenuation of the pulmonary vasculature.  Lungs are clear.  Heart size is normal.  No pneumothorax or pleural fluid.  Surgical clips  right breast and axilla are again seen.  IMPRESSION:  1.  No acute finding. 2.  Emphysema.   Original Report Authenticated By: Holley Dexter, M.D.   Dg Chest 2 View  05/31/2013   *RADIOLOGY REPORT*  Clinical Data: This shortness of breath  CHEST - 2 VIEW  Comparison: 05/14/2013  Findings: The lungs are again hyperexpanded.  Cardiac shadow is stable.  No focal infiltrate is seen.  No bony abnormality is noted.  IMPRESSION: No acute abnormality noted.   Original Report Authenticated By: Alcide Clever, M.D.   Dg Chest 2 View  05/14/2013   *RADIOLOGY REPORT*  Clinical Data: Shortness of breath, cough, history of asthma and smoking  CHEST - 2 VIEW  Comparison: 02/03/2013; 10/28/2012  Findings: Grossly unchanged cardiac silhouette and mediastinal contours.  The lungs remain hyperexpanded with flattening of  bilateral hemidiaphragms and blunting of the bilateral costophrenic angles.  Nipple shadows overlie the bilateral mid/lower lungs.  No focal airspace opacities.  No pleural effusion or pneumothorax.  No evidence of edema.  Surgical clips overlie the lateral aspect of the right breast.  Unchanged bones.  IMPRESSION: Hyperexpanded lungs without acute cardiopulmonary disease.   Original Report Authenticated By: Tacey Ruiz, MD    2D Echo: none  Cardiac Cath: none  Admission HPI: Original Author Janalyn Harder, MD  The patient is a 52 yo W, history of COPD (GOLD 4), hypothyroidism, seizure disorder, presenting with altered mental status. Due to the patient's mental status, much of the HPI was obtained via chart review and report from the patient's sister. Per the patient's sister, the patient called EMS this morning due to vomiting, dyspnea, and chest pain. On arrival in the ED, the patient was found to be dyspneic, with O2 sats in the 80's. She was also found to be somnolent, though arouseable to verbal or tactile stimulation. When asked, the patient reports taking 2 Xanax last night, due to stress surrounding the death of her nephew. However, she filled a prescription for 90 Xanax 0.5 mg tabs, ordered 05/28/13 (I called pharmacy, [facility] on [address], to confirm that 90 were dispensed), and ED intake forms indicate that she has a bottle with only 3 tabs remaining. She reportedly denied suicidal ideation to the ED provider.  The patient has a history of COPD, GOLD stage 4, with several visits to the ED for dyspnea, most recently 8/25, where she was given a 5-day course of prednisone 50 mg daily. She is a current smoker, and prior notes indicate non-compliance with her home inhalers. Currently, she denies dyspnea, though unsure of accuracy due to somnolence.    Hospital Course by problem list: Principal Problem:   Benzodiazepine overdose Active Problems:   COPD IV with reversibility/ still smoking    Smoker   Cellulitis   Altered Mental Status  - Patient presented with somnolence and respiratory distress on presentation assumed to be due to benzodiazapine overdose considering patient with 3 remaining pills of Xanax 90 pill bottle that was filled 6 days prior to admission. Per patient she only took two and shared the rest with friends. ABG revealed low pH, hypoxia and high bicarbonate consistent with respiratory acidosis. Ethanol, salicylate, acetaminophen levels were normal. TSH was also normal.  Urine drug screen was positive for benzodiazapine and cocaine  Patient was alert and orientated with no respiratory distress (with normal oxygen saturation on RA) and soon returned to baseline a few hours after admission. Patient was given IV fluids and respiratory status and vital signs  closely monitored. Flumazenil was not administered due to risk of seizure activity in setting of patient's seizure history.     Chest Pain - Patient reported to have chest pain prior to admission per sister. Patient was chest pain free during hospitalization. EKG revealed no ischemic changes and troponins were found to be negative. CXR revealed emphysema. BNP was within normal limits. UDS was positive for cocaine and benzodiazapine use. Chest pain etiology possibly due to cocaine-induced vasospasm.    Leukocytosis - Patient presented with leukocytosis (17.9) possibly due to LE cellulitis that improved during hospitalization. Urine analysis did not reveal evidence of UTI.  Lactic acid levels were within normal limits. Blood cultures were negative for growth to date. Patient was started on vancomycin for suspected LE cellulitis.  No other source of infection was found.    Elevated Anion Gap - Patient with anion on admission of 14 that resolved (AG 10) during hospitalization. Blood and urine osmolarity were within normal limits.    Hypoalbuminemia - Patient presented with low albumin that did not normalize during hospitalization  most likely due to malnutrition.   Cellulitis - Patient with multiple erythematosus papules on bilateral LE, possibly due to insect bites (reported by patient) or track marks from drug use. MRSA by PCR was negative. Doppler US of b/l LE revealed no thrombosis.   Patient was started on vancomycin and prescribed doxycycline 100mg  BID for the next 6 days starting day after discharge. Patient understood medication instrucitons and stated she has insurance and will be able to afford the medication. She stated she will fill prescription when she leaves the hospital.     Chronic Obstructive Pulmonary Disease - Patient with COPD Gold Stage 4 with reported history of noncompliance with inhaler use [employer], & Proventil) and multiple ED visits, last on 05/31/13 where she was given 5 day prednisone course. CXR revealed  emphysema. Patient was given scheduled breathing treatment and systemic corticosteroids (IV solumedrol in ED). Patient to continue prednisone course previously prescribed upon discharge. No antiibiotics were necessary due to absence of change in sputum.     Tobacco Abuse - Patient is current smoker. No signs of nicotine withdrawal were reported during hospitalizaiton. Patient was counseled on smoking cessation.      Hypothyroidism - Patient with normal TSH, currently in euthyroid state.  Patient was continued on thyroid replacement during hospitalization.   Seizure Disorder - Patient on home depakote but reports she was not taking it as she is supposed to. Depakote levels were found to be low. Patient was continued on depakote during hospitalization and patient  instructed to take medication as directed. No seizure activity was reported during hospitalization.    Anxiety/Depression - Patient with noncompliance of prozac for past year and recently prescribed Xanax by her pulmonologist due to increased anxiety from chronic dyspnea. Patient was continued on home prozac during  hospitalization.  Patient with recent death in family and reportedly using cocaine for depression.  No suicide ideation was noted but due to recent death in her family psychiatry consult was ordered who reported she is not in harm of hurting herself or others and no inpatient admission was necessary. 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 Supportive therapy was provided concerning ongoing stressors. Crisis plan was discussed, support from social network, calling 911, coming to the Emergency Department, and calling Suicide Hotline. At time of discharge patient stated she was not suicidal or desired to hurt others . No reports  of hallucination during hospitalization. Due to late discharge, social worker called her day following discharge regarding outpatient mental health appointment. Patient was given resources and provided information. Patient to contact [facility] for further psychiatric follow-up.    Discharge Vitals:   BP 132/77  Pulse 94  Temp(Src) 98.6 F (37 C) (Oral)  Resp 13  Ht 5\' 11"  (1.803 m)  Wt 64.411 kg (142 lb)  BMI 19.81 kg/m2  SpO2 98%  Discharge Labs:  No results found for this or any previous visit (from the past 24 hour(s)).  Signed: Otis Brace, MD 06/05/2013, 7:20 PM   Time Spent on Discharge: 35 minutes Services Ordered on Discharge: none Equipment Ordered on Discharge: none

## 2013-06-05 NOTE — Progress Notes (Signed)
Clinical Social Worker (CSW) was contacted by RN to set up outpatient mental health appointment for patient but RN informed CSW that patient was D/C on 06/04/13. CSW explained to RN that since the patient is D/C already we can't set up an outpatient appointment but I would give her some resources. CSW contacted patient at [phone] and provided [facility] address and phone number to her. Patient was thankful for CSW and plans to follow up with [facility]. CSW signing off.  Jetta Lout, LCSWA Weekend CSW [phone]

## 2013-06-07 NOTE — ED Provider Notes (Signed)
Medical screening examination/treatment/procedure(s) were performed by non-physician practitioner and as supervising physician I was immediately available for consultation/collaboration.  Oprah Camarena T Jasyah Theurer, MD 06/07/13 0826 

## 2013-06-09 LAB — CULTURE, BLOOD (ROUTINE X 2): Culture: NO GROWTH

## 2013-06-15 ENCOUNTER — Encounter: Payer: Self-pay | Admitting: Internal Medicine

## 2013-06-15 ENCOUNTER — Other Ambulatory Visit: Payer: Self-pay | Admitting: Internal Medicine

## 2013-06-15 DIAGNOSIS — G4734 Idiopathic sleep related nonobstructive alveolar hypoventilation: Secondary | ICD-10-CM

## 2013-06-15 DIAGNOSIS — J9601 Acute respiratory failure with hypoxia: Secondary | ICD-10-CM | POA: Insufficient documentation

## 2013-07-12 ENCOUNTER — Emergency Department (HOSPITAL_COMMUNITY)
Admission: EM | Admit: 2013-07-12 | Discharge: 2013-07-12 | Discharge status: Against Medical Advice | Payer: Medicaid Other | Attending: Emergency Medicine | Admitting: Emergency Medicine

## 2013-07-12 ENCOUNTER — Encounter (HOSPITAL_COMMUNITY): Payer: Self-pay | Admitting: *Deleted

## 2013-07-12 ENCOUNTER — Emergency Department (HOSPITAL_COMMUNITY): Payer: Medicaid Other

## 2013-07-12 DIAGNOSIS — J441 Chronic obstructive pulmonary disease with (acute) exacerbation: Secondary | ICD-10-CM | POA: Insufficient documentation

## 2013-07-12 DIAGNOSIS — F17200 Nicotine dependence, unspecified, uncomplicated: Secondary | ICD-10-CM | POA: Insufficient documentation

## 2013-07-12 DIAGNOSIS — Z9981 Dependence on supplemental oxygen: Secondary | ICD-10-CM | POA: Insufficient documentation

## 2013-07-12 DIAGNOSIS — R21 Rash and other nonspecific skin eruption: Secondary | ICD-10-CM | POA: Insufficient documentation

## 2013-07-12 NOTE — ED Provider Notes (Signed)
Patient left prior to being evaluated by myself.   Jeannetta Ellis, PA-C 07/12/13 1656

## 2013-07-12 NOTE — ED Notes (Signed)
Pt uses oxygen at nite.  Pt is here with sob COPD/asthma.  No chest pain.  Pt here with skin breakout that itches-- all over body and in webs of fingers

## 2013-07-12 NOTE — ED Notes (Signed)
Pt placed in room. RN charting pt came to desk and asked why the MD has not seen pt. RN explained process and business of department and sts provider would be in as soon as possible. When RN came back to room pt gone.

## 2013-07-12 NOTE — ED Provider Notes (Signed)
Medical screening examination/treatment/procedure(s) were performed by non-physician practitioner and as supervising physician I was immediately available for consultation/collaboration.   Dagmar Hait, MD 07/12/13 [phone]

## 2013-07-12 NOTE — ED Notes (Signed)
Pt came to nurse first and stated "I can't stay here any longer, I have to go." Pt seen leaving ED

## 2013-07-13 ENCOUNTER — Emergency Department (HOSPITAL_COMMUNITY)
Admission: EM | Admit: 2013-07-13 | Discharge: 2013-07-13 | Disposition: A | Discharge status: Home / Self Care | Payer: Medicaid Other | Attending: Emergency Medicine | Admitting: Emergency Medicine

## 2013-07-13 ENCOUNTER — Encounter (HOSPITAL_COMMUNITY): Payer: Self-pay | Admitting: Emergency Medicine

## 2013-07-13 DIAGNOSIS — Z8669 Personal history of other diseases of the nervous system and sense organs: Secondary | ICD-10-CM | POA: Insufficient documentation

## 2013-07-13 DIAGNOSIS — Z79899 Other long term (current) drug therapy: Secondary | ICD-10-CM | POA: Insufficient documentation

## 2013-07-13 DIAGNOSIS — E039 Hypothyroidism, unspecified: Secondary | ICD-10-CM | POA: Insufficient documentation

## 2013-07-13 DIAGNOSIS — J449 Chronic obstructive pulmonary disease, unspecified: Secondary | ICD-10-CM | POA: Insufficient documentation

## 2013-07-13 DIAGNOSIS — Z8659 Personal history of other mental and behavioral disorders: Secondary | ICD-10-CM | POA: Insufficient documentation

## 2013-07-13 DIAGNOSIS — B86 Scabies: Secondary | ICD-10-CM | POA: Insufficient documentation

## 2013-07-13 DIAGNOSIS — F17200 Nicotine dependence, unspecified, uncomplicated: Secondary | ICD-10-CM | POA: Insufficient documentation

## 2013-07-13 DIAGNOSIS — IMO0002 Reserved for concepts with insufficient information to code with codable children: Secondary | ICD-10-CM | POA: Insufficient documentation

## 2013-07-13 DIAGNOSIS — J4489 Other specified chronic obstructive pulmonary disease: Secondary | ICD-10-CM | POA: Insufficient documentation

## 2013-07-13 DIAGNOSIS — Z853 Personal history of malignant neoplasm of breast: Secondary | ICD-10-CM | POA: Insufficient documentation

## 2013-07-13 MED ORDER — PERMETHRIN 5 % EX CREA: TOPICAL_CREAM | CUTANEOUS | Status: DC
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
## 2013-07-13 NOTE — ED Provider Notes (Signed)
Medical screening examination/treatment/procedure(s) were performed by non-physician practitioner and as supervising physician I was immediately available for consultation/collaboration.    Zavier Canela R Viviann Broyles, MD 07/13/13 1613 

## 2013-07-13 NOTE — ED Notes (Signed)
Pt was here yesterday for same chief complaint.  Pt has rash all over her body, small raised areas that she is scratching and she states these started about a week ago.  Pt has history of COPD and upon entering the room she was sob, oxygen sat at 84.  Put on 2L. Pt repeating herself a lot, not finishing some sentences.  She states her sister is here, with a rash.  She has been sharign residence and smoking cigarettes with her sister.

## 2013-08-03 ENCOUNTER — Telehealth: Payer: Self-pay | Admitting: Internal Medicine

## 2013-08-03 NOTE — Telephone Encounter (Signed)
ROV 3 mos//hdp lm x1 10/28-LM w/ pt's sister to sched-per pt's sister, she will have pt call us to sched   I spoke with pt and appt scheduled. Nothing further needed

## 2013-08-09 ENCOUNTER — Ambulatory Visit: Payer: Self-pay | Admitting: Internal Medicine

## 2013-08-12 ENCOUNTER — Encounter: Payer: Self-pay | Admitting: Internal Medicine

## 2013-08-12 ENCOUNTER — Ambulatory Visit (INDEPENDENT_AMBULATORY_CARE_PROVIDER_SITE_OTHER): Payer: Medicaid Other | Admitting: Internal Medicine

## 2013-08-12 ENCOUNTER — Other Ambulatory Visit: Payer: Self-pay | Admitting: Internal Medicine

## 2013-08-12 VITALS — BP 102/60 | HR 77 | Temp 98.2°F | Ht 70.0 in | Wt 144.4 lb

## 2013-08-12 DIAGNOSIS — J449 Chronic obstructive pulmonary disease, unspecified: Secondary | ICD-10-CM

## 2013-08-12 DIAGNOSIS — F17200 Nicotine dependence, unspecified, uncomplicated: Secondary | ICD-10-CM

## 2013-08-12 MED ORDER — ACLIDINIUM BROMIDE 400 MCG/ACT IN AEPB: 1.0000 | INHALATION_SPRAY | Freq: Two times a day (BID) | RESPIRATORY_TRACT | Status: DC

## 2013-08-12 MED ORDER — ALPRAZOLAM 0.5 MG PO TABS: 0.5000 mg | ORAL_TABLET | Freq: Every evening | ORAL | Status: DC | PRN

## 2013-08-12 NOTE — Progress Notes (Signed)
Subjective:     Patient ID: Courtney Terry, female   DOB: [birth year] MRN: [id number]  Brief patient profile:  89 yobf smoker with new sob 2014 referred to pulmonary clinic 02/26/2013 for ? Copd by [facility]/hospitalists. Sees NP  [place].with GOLD IV copd by pft's 04/15/13   HPI 02/26/2013 1st pulmonary ov/ Wert Chief Complaint  Patient presents with  . Acute Visit    Pt c/o increased SOB for the past wk. She states gets SOB just walking short distances such as from lobby to exam room today and also sometimes gets SOB at rest. She has also noticed increased cough and wheezing. Cough is prod with moderate yellow to clear sputum.   indolent onset, variable sob activity and at rest x 3 months better on prednisone with apparent over use of saba at baseline with more day than night time symptoms. Transiently better p saba rec The key is to stop smoking completely before smoking completely stops you!  Prednisone 10 mg take  4 each am x 2 days,   2 each am x 2 days,  1 each am x2days and stop  Plan A= automatic = dulera 200 Take 2 puffs first thing in am and then another 2 puffs about 12 hours later.  Plan B = Backup = Only use your albuterol (red inhaler = proaire) as a rescue medication     04/15/2013 f/u ov/Wert re gold iv copd/ still smoking Chief Complaint  Patient presents with  . Followup with PFT    Pt states her breathing has improved slightly since the last visit. She c/o nose bleeds on and off for the past 3 wks.    doe x greater than slow walk or > 100 ft, less saba use daytime rec  key is to stop smoking completely before smoking completely stops you!  Work on inhaler technique:     05/28/2013 f/u ov/Wert copd G IV stop smoking around mid July Chief Complaint  Patient presents with  . COPD    Breathing has gotten worse since last OV. Has to been to the hospital. Reports her breathing is worse at night.  sob sitting still, worse lying down, x 50 ft  rec Plan A = automatic = dulera  200 2 puffs and Tudorza one puff repeat every 12 hours Plan B = backup = albuterol = proaire/ ventolin >  Only use  l as a rescue medication to be used if you can't catch your breath by resting or doing a relaxed purse lip breathing pattern. The less you use it, the better it will work when you need it. Ok to use up to 2 puffs every 4 hours Plan C = xanax (alprazolam) take 0.5 mg every 4 hours if can't get your breath after using Plan B first  08/12/2013 f/u ov/Wert still smoking  re: copd worse off tudorza  Chief Complaint  Patient presents with  . Follow-up    Pt states DOE worse for the past 3 wks. Gets SOB walking approx 100 ft. Ventolin twice daily for past few weeks     No obvious  daytime variabilty or assoc cough   cp or chest tightness, subjective wheeze overt sinus or hb symptoms. No unusual exp hx or h/o childhood pna/ asthma or premature birth to her knowledge.   Sleeping ok without nocturnal  or early am exacerbation  of respiratory  c/o's or need for noct saba. Also denies any obvious fluctuation of symptoms with weather or environmental changes or other  aggravating or alleviating factors except as outlined above   Current Medications, Allergies, Past Medical History, Past Surgical History, Family History, and Social History were reviewed in [employer] record.  ROS  The following are not active complaints unless bolded sore throat, dysphagia, dental problems, itching, sneezing,  nasal congestion or excess/ purulent secretions, ear ache,   fever, chills, sweats, unintended wt loss, pleuritic or exertional cp, hemoptysis,  orthopnea pnd or leg swelling, presyncope, palpitations, heartburn, abdominal pain, anorexia, nausea, vomiting, diarrhea  or change in bowel or urinary habits, change in stools or urine, dysuria,hematuria,  rash, arthralgias, visual complaints, headache, numbness weakness or ataxia or problems with walking or coordination,  change in mood/affect  or memory.            Objective:   Physical Exam   04/15/2013     134  Vs 142  05/28/13 > 08/12/2013 144  Wt Readings from Last 3 Encounters:  02/26/13 139 lb 6.4 oz (63.231 kg)  06/23/12 149 lb (67.586 kg)  03/06/12 175 lb (79.379 kg)    amb anxious bf nad unusual affect HEENT mild turbinate edema.  Oropharynx no thrush or excess pnd or cobblestoning.  No JVD or cervical adenopathy. Mild accessory muscle hypertrophy. Trachea midline, nl thryroid. Chest was hyperinflated by percussion with diminished breath sounds and moderate increased exp time with trace end exp wheeze. Hoover sign positive at mid inspiration. Regular rate and rhythm without murmur gallop or rub or increase P2 or edema.  Abd: no hsm, nl excursion. Ext warm without cyanosis or clubbing.       cxr 05/14/13 Hyperexpanded lungs without acute cardiopulmonary disease.   Assessment:

## 2013-08-12 NOTE — Patient Instructions (Addendum)
Plan A = automatic = dulera 200 2 puffs and Tudorza one puff repeat every 12 hours  Plan B = backup = albuterol = proaire/ ventolin >  Only use  l as a rescue medication to be used if you can't catch your breath by resting or doing a relaxed purse lip breathing pattern. The less you use it, the better it will work when you need it. Ok to use up to 2 puffs every 4 hours  Plan C = xanax (alprazolam) take 0.5 mg every 4 hours if can't get your breath after using Plan B first  Please schedule a follow up visit in 3 months but call sooner if needed

## 2013-08-12 NOTE — Assessment & Plan Note (Addendum)
DDX of  difficult airways managment all start with A and  include Adherence, Ace Inhibitors, Acid Reflux, Active Sinus Disease, Alpha 1 Antitripsin deficiency, Anxiety masquerading as Airways dz,  ABPA,  allergy(esp in young), Aspiration (esp in elderly), Adverse effects of DPI,  Active smokers, plus two Bs  = Bronchiectasis and Beta blocker use..and one C= CHF   Adherence is always the initial "prime suspect" and is a multilayered concern that requires a "trust but verify" approach in every patient - starting with knowing how to use medications, especially inhalers, correctly, keeping up with refills and understanding the fundamental difference between maintenance and prns vs those medications only taken for a very short course and then stopped and not refilled.  - she doesn't know the names of her meds and tends to run out and use the er to much - The proper method of use, as well as anticipated side effects, of a metered-dose inhaler are discussed and demonstrated to the patient. Improved effectiveness after extensive coaching during this visit to a level of approximately  90%   Anxiety at the top of the list > much better since rx with less panick, ER Use > continue prn xanax   She does meet criteria for amb 02 but this would not be needed if she quit smoking, discussed separately

## 2013-08-12 NOTE — Assessment & Plan Note (Signed)

## 2013-08-16 ENCOUNTER — Emergency Department (HOSPITAL_COMMUNITY): Payer: Medicaid Other

## 2013-08-16 ENCOUNTER — Telehealth: Payer: Self-pay | Admitting: Internal Medicine

## 2013-08-16 ENCOUNTER — Emergency Department (HOSPITAL_COMMUNITY)
Admission: EM | Admit: 2013-08-16 | Discharge: 2013-08-16 | Disposition: A | Discharge status: Home / Self Care | Payer: Medicaid Other | Attending: Emergency Medicine | Admitting: Emergency Medicine

## 2013-08-16 ENCOUNTER — Encounter (HOSPITAL_COMMUNITY): Payer: Self-pay | Admitting: Emergency Medicine

## 2013-08-16 DIAGNOSIS — Z8739 Personal history of other diseases of the musculoskeletal system and connective tissue: Secondary | ICD-10-CM | POA: Insufficient documentation

## 2013-08-16 DIAGNOSIS — Z8669 Personal history of other diseases of the nervous system and sense organs: Secondary | ICD-10-CM | POA: Insufficient documentation

## 2013-08-16 DIAGNOSIS — F17200 Nicotine dependence, unspecified, uncomplicated: Secondary | ICD-10-CM | POA: Insufficient documentation

## 2013-08-16 DIAGNOSIS — F329 Major depressive disorder, single episode, unspecified: Secondary | ICD-10-CM | POA: Insufficient documentation

## 2013-08-16 DIAGNOSIS — F411 Generalized anxiety disorder: Secondary | ICD-10-CM | POA: Insufficient documentation

## 2013-08-16 DIAGNOSIS — Z79899 Other long term (current) drug therapy: Secondary | ICD-10-CM | POA: Insufficient documentation

## 2013-08-16 DIAGNOSIS — E039 Hypothyroidism, unspecified: Secondary | ICD-10-CM | POA: Insufficient documentation

## 2013-08-16 DIAGNOSIS — J45909 Unspecified asthma, uncomplicated: Secondary | ICD-10-CM

## 2013-08-16 DIAGNOSIS — J441 Chronic obstructive pulmonary disease with (acute) exacerbation: Secondary | ICD-10-CM | POA: Insufficient documentation

## 2013-08-16 DIAGNOSIS — R109 Unspecified abdominal pain: Secondary | ICD-10-CM | POA: Insufficient documentation

## 2013-08-16 DIAGNOSIS — F3289 Other specified depressive episodes: Secondary | ICD-10-CM | POA: Insufficient documentation

## 2013-08-16 DIAGNOSIS — IMO0002 Reserved for concepts with insufficient information to code with codable children: Secondary | ICD-10-CM | POA: Insufficient documentation

## 2013-08-16 DIAGNOSIS — Z853 Personal history of malignant neoplasm of breast: Secondary | ICD-10-CM | POA: Insufficient documentation

## 2013-08-16 NOTE — Telephone Encounter (Signed)
We did not certify her for amb 02 because she didn't appear to be interested but if she is need to return for baseline study then document improvement and adequate sats on ambulatory 02 (either Tammy NP or me)  2) she needs to file this theft with police or she may be subject to police investigation if her drugs end up in someone else's hands  3) if she has filed a police report (tell her we will have to verify this before next ov) then I'm ok with giving here half of the supply we gave her but she'll need to return here to regroup before these run out to see me or Tammy NP

## 2013-08-16 NOTE — Telephone Encounter (Deleted)
Dr. Sherene Sires at last OV on 08-12-13 the pt was walked and saturations dropped to 88% on the 2nd lap. Doe she still need to be brought back in to certify? Carron Curie, CMA

## 2013-08-16 NOTE — Telephone Encounter (Signed)
Last OV 116/14 No pending OV Rx was given to pt on 08/12/13 #90  MW - please advise on refill of Xanax. Are you okay with [place] placing order for portable O2?

## 2013-08-16 NOTE — Telephone Encounter (Signed)
Sorry no can do Will need ov to regroup and set rules /limits on number of rx's - best to do this with Tammy  rec See Tammy NP w/in 2 weeks with all your medications, even over the counter meds, separated in two separate bags, the ones you take no matter what vs the ones you stop once you feel better and take only as needed when you feel you need them.   Tammy  will generate for you a new user friendly medication calendar that will put [place] all on the same page re: your medication use.     Without this process, it simply isn't possible to assure that we are providing  your outpatient care  with  the attention to detail we feel you deserve.   If we cannot assure that you're getting that kind of care,  then we cannot manage your problem effectively from this clinic.  Once you have seen Tammy and we are sure that we're all on the same page with your medication use she will arrange follow up with me.
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
## 2013-08-16 NOTE — Telephone Encounter (Signed)
Pt refused to schedule an appt w/ MW.  Pt states she went to the ED for pain in her stomach & that her breathing is just fine.  Pt states she just saw MW on 08/12/13 & doesn't need to see him again this soon.  Courtney Terry

## 2013-08-16 NOTE — ED Notes (Signed)
Pt c/o increased SOB x 1 day; pt sts some lower abd pain; pt sts hx of COPD but admits to smoking and crack use

## 2013-08-16 NOTE — Telephone Encounter (Signed)
Message copied by Antionette Fairy on Mon Aug 16, 2013  3:06 PM ------      Message from: Mancel Bale      Created: Mon Aug 16, 2013 11:14 AM      Regarding: Order for [facility]             Patient Name: ZOXWRUE,AVWUJW([id number])      Sex: Female      DOB: September 19, 1961              PCP: Ignacia Bayley        Center: [facility]             Types of orders made on 08/16/2013: Consult, ECG, Imaging, Medications, Nursing            Order Date:08/16/2013      Ordering User:WENTZ, ELLIOTT [[id number]]      Attending Provider:Elliott Rachel Moulds, MD [phone]      Authorizing Provider: Flint Melter, MD [[id number]]      Department:[facility][[id number]]            Order Specific Information      Order: COPD clinic follow up [Custom: [id number]]  Order #: [id number]  Qty: 1        Priority: Routine  Class: Hospital Performed          Where does the patient receive follow up COPD care -> [place]                 Follow up in -> 5-7 days               Released on: 08/16/2013 11:14 AM                    Priority: Routine  Class: Hospital Performed          Where does the patient receive follow up COPD care -> [place]                 Follow up in -> 5-7 days               Released on: 08/16/2013 11:14 AM       ------
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
## 2013-08-17 NOTE — Telephone Encounter (Signed)
I spoke with the pt and advised that we cannot give her a new rx for xanax. Pt then states she doesn't need an new rx. She states she has the rx he gave her on 08-12-13 but that she cannot get it filled until 08-24-13 because she already had an rx from [facility], but this is the one she lost. I advised we cannot give the ok for an early refill. I also advised that she needs to schedule an appt to go over medications. Pt refuses an appt. Dr. Sherene Sires on 08-12-13 you wrote an rx for xanax #90 tablet with 2 refills. Do you want to keep this rx as is since the pt will not schedule an appt? Carron Curie, CMA

## 2013-08-17 NOTE — Telephone Encounter (Signed)
Until now I didn't realize what the problem was and i can't help her solve this  F/u here can be prn when there's a problem I can help her with - no further rx of any type from this office s ov with all meds in hand

## 2013-08-17 NOTE — Telephone Encounter (Signed)
Spoke with pt.  Advised we have cancelled the xanax rx at [facility], and she would need to come in for OV with [facility] to regroup.  Pt in agreement with scheduling OV at this time.  We have scheduled this for Friday, Nov 14 at 3:15 pm, date per pt's request.  Pt aware to bring ALL medications with her to OV.  She verbalized understanding and voiced no further questions or concerns at this time.

## 2013-08-17 NOTE — Telephone Encounter (Signed)
lmomtcb x1 for pt 

## 2013-08-17 NOTE — Telephone Encounter (Signed)
I called [facility], spoke with Phil.  They do have the xanax rx from 08/12/13 from Dr. Sherene Sires.  Pt has not pick up rx yet.  He will VOID the entire rx the #90 plus 2 additional refills.  Advised we would call pt to let her know of this.  I have lmomtcb to let pt know we have cancelled the xanax rx at [facility].  She will need to come into the office to see Dr. Sherene Sires with all medications with her for any rxs.

## 2013-08-20 ENCOUNTER — Ambulatory Visit: Payer: Self-pay | Admitting: Internal Medicine

## 2013-08-26 ENCOUNTER — Encounter: Payer: Self-pay | Admitting: Internal Medicine

## 2013-08-26 ENCOUNTER — Ambulatory Visit (INDEPENDENT_AMBULATORY_CARE_PROVIDER_SITE_OTHER): Payer: Medicaid Other | Admitting: Internal Medicine

## 2013-08-26 VITALS — BP 130/82 | HR 96 | Temp 98.8°F | Ht 70.0 in | Wt 145.4 lb

## 2013-08-26 DIAGNOSIS — J449 Chronic obstructive pulmonary disease, unspecified: Secondary | ICD-10-CM

## 2013-08-26 DIAGNOSIS — F17200 Nicotine dependence, unspecified, uncomplicated: Secondary | ICD-10-CM

## 2013-08-26 DIAGNOSIS — J4489 Other specified chronic obstructive pulmonary disease: Secondary | ICD-10-CM

## 2013-08-26 MED ORDER — ALPRAZOLAM 0.5 MG PO TABS: 0.5000 mg | ORAL_TABLET | Freq: Every evening | ORAL | Status: DC | PRN

## 2013-08-26 MED ORDER — ALPRAZOLAM 0.5 MG PO TABS: ORAL_TABLET | ORAL | Status: DC

## 2013-08-26 NOTE — Progress Notes (Signed)
Subjective:     Patient ID: Courtney Terry, female   DOB: [birth year] MRN: [id number]  Brief patient profile:  86 yobf smoker with new sob 2014 referred to pulmonary clinic 02/26/2013 for ? Copd by [facility]/hospitalists. Sees NP  Yeboah.with GOLD IV copd by pft's 04/15/13   HPI 02/26/2013 1st pulmonary ov/ Siris Hoos Chief Complaint  Patient presents with  . Acute Visit    Pt c/o increased SOB for the past wk. She states gets SOB just walking short distances such as from lobby to exam room today and also sometimes gets SOB at rest. She has also noticed increased cough and wheezing. Cough is prod with moderate yellow to clear sputum.   indolent onset, variable sob activity and at rest x 3 months better on prednisone with apparent over use of saba at baseline with more day than night time symptoms. Transiently better p saba rec The key is to stop smoking completely before smoking completely stops you!  Prednisone 10 mg take  4 each am x 2 days,   2 each am x 2 days,  1 each am x2days and stop  Plan A= automatic = dulera 200 Take 2 puffs first thing in am and then another 2 puffs about 12 hours later.  Plan B = Backup = Only use your albuterol (red inhaler = proaire) as a rescue medication     04/15/2013 f/u ov/Everrett Lacasse re gold iv copd/ still smoking Chief Complaint  Patient presents with  . Followup with PFT    Pt states her breathing has improved slightly since the last visit. She c/o nose bleeds on and off for the past 3 wks.    doe x greater than slow walk or > 100 ft, less saba use daytime rec  key is to stop smoking completely before smoking completely stops you!  Work on inhaler technique:     05/28/2013 f/u ov/Bridgett Hattabaugh copd G IV stop smoking around mid July Chief Complaint  Patient presents with  . COPD    Breathing has gotten worse since last OV. Has to been to the hospital. Reports her breathing is worse at night.  sob sitting still, worse lying down, x 50 ft  rec Plan A = automatic = dulera  200 2 puffs and Tudorza one puff repeat every 12 hours Plan B = backup = albuterol = proaire/ ventolin >  Only use  l as a rescue medication to be used if you can't catch your breath by resting or doing a relaxed purse lip breathing pattern. The less you use it, the better it will work when you need it. Ok to use up to 2 puffs every 4 hours Plan C = xanax (alprazolam) take 0.5 mg every 4 hours if can't get your breath after using Plan B first   06/03/13  Urine POS cocaine    08/12/2013 f/u ov/Kameron Blethen still smoking  re: copd worse off tudorza  Chief Complaint  Patient presents with  . Follow-up    Pt states DOE worse for the past 3 wks. Gets SOB walking approx 100 ft. Ventolin twice daily for past few weeks  rec Plan A = automatic = dulera 200 2 puffs and Tudorza one puff repeat every 12 hours Plan B = backup = albuterol = proaire/ ventolin >  Only use  l as a rescue medication to be used if you can't catch your breath by resting or doing a relaxed purse lip breathing pattern. The less you use it, the better it will  work when you need it. Ok to use up to 2 puffs every 4 hours Plan C = xanax (alprazolam) take 0.5 mg every 4 hours if can't get your breath after using Plan B first  08/16/13 ER note admitted to ER Doctor use of crack cocaine   08/26/2013 f/u ov/Tazaria Dlugosz re: copd/ confused with meds, all inhalers on 0 Demonstrated read back last ov instructions but admitted not following  Chief Complaint  Patient presents with  . Follow-up    Pt c/o increased SOB since her last appt. She states she gets SOB walking short distances. She states has used rescue inhaler 3 times in the past wk.   wakes up with 02 on 4/7 ams with some cough >  mostly white mucus Not able to use dulera or tudorza first thing in am as rec    No obvious  daytime variabilty or assoc cough   cp or chest tightness, subjective wheeze overt sinus or hb symptoms. No unusual exp hx or h/o childhood pna/ asthma or premature birth to  her knowledge.   Sleeping ok without nocturnal  or early am exacerbation  of respiratory  c/o's or need for noct saba. Also denies any obvious fluctuation of symptoms with weather or environmental changes or other aggravating or alleviating factors except as outlined above   Current Medications, Allergies, Past Medical History, Past Surgical History, Family History, and Social History were reviewed in [employer] record.  ROS  The following are not active complaints unless bolded sore throat, dysphagia, dental problems, itching, sneezing,  nasal congestion or excess/ purulent secretions, ear ache,   fever, chills, sweats, unintended wt loss, pleuritic or exertional cp, hemoptysis,  orthopnea pnd or leg swelling, presyncope, palpitations, heartburn, abdominal pain, anorexia, nausea, vomiting, diarrhea  or change in bowel or urinary habits, change in stools or urine, dysuria,hematuria,  rash, arthralgias, visual complaints, headache, numbness weakness or ataxia or problems with walking or coordination,  change in mood/affect or memory.            Objective:   Physical Exam   04/15/2013     134  Vs 142  05/28/13 > 08/12/2013 144 > 08/26/2013  145 Wt Readings from Last 3 Encounters:  02/26/13 139 lb 6.4 oz (63.231 kg)  06/23/12 149 lb (67.586 kg)  03/06/12 175 lb (79.379 kg)    amb anxious bf nad unusual affect  Patient failed to answer a single question asked in a straightforward manner, tending to go off on tangents or answer questions with ambiguous medical terms or diagnoses and seemed aggravated  when asked the same question more than once for clarification.   HEENT mild turbinate edema.  Oropharynx no thrush or excess pnd or cobblestoning.  No JVD or cervical adenopathy. Mild accessory muscle hypertrophy. Trachea midline, nl thryroid. Chest was hyperinflated by percussion with diminished breath sounds and moderate increased exp time with trace end exp wheeze. Hoover  sign positive at mid inspiration. Regular rate and rhythm without murmur gallop or rub or increase P2 or edema.  Abd: no hsm, nl excursion. Ext warm without cyanosis or clubbing.       cxr 05/14/13 Hyperexpanded lungs without acute cardiopulmonary disease.   Assessment:

## 2013-08-26 NOTE — Patient Instructions (Addendum)
Plan A = automatic = dulera 200 2 puffs and tudorza one puff first thing in am and repeat 12 hours later  Plan B= backup = ventolin up to 4 puffs every 4 hours if needed  For anxiety can take the xanax up to every 4 hours if needed  Please schedule a follow up office visit in 2 weeks, sooner if needed with all active medications in hand

## 2013-08-29 NOTE — Assessment & Plan Note (Signed)
-   PFT's 04/15/13 FEV1  0.56 (18%) ratio 37 and 27% better p B2  DLCO 39 corrects to 60 - hfa 75% 04/15/13 p coaching> 90% 05/28/2013  - added tudorza one bid 05/28/2013  - 05/28/2013   Walked RA x one lap @ 185 stopped due to  Sob no tachypnea with sat 93%  - 08/12/2013  Walked RA x 3 laps @ 185 ft each stopped due to end of study sats 88% - 08/26/2013   Walked RA x one lap @ 185 stopped due to sob with sat 97%    DDX of  difficult airways managment all start with A and  include Adherence, Ace Inhibitors, Acid Reflux, Active Sinus Disease, Alpha 1 Antitripsin deficiency, Anxiety masquerading as Airways dz,  ABPA,  allergy(esp in young), Aspiration (esp in elderly), Adverse effects of DPI,  Active smokers, plus two Bs  = Bronchiectasis and Beta blocker use..and one C= CHF   Active smoking greatest concern, discussed separately  Anxiety a major component, extended discussion re approp use of xanax and limited rx given contingent on her returning with all meds in hand to do accurate med rec  See instructions for specific recommendations which were reviewed directly with the patient who was given a copy with highlighter outlining the key components.

## 2013-08-29 NOTE — Assessment & Plan Note (Signed)

## 2013-09-09 ENCOUNTER — Ambulatory Visit: Payer: Medicaid Other | Admitting: Internal Medicine

## 2013-09-14 ENCOUNTER — Ambulatory Visit: Payer: Medicaid Other | Admitting: Internal Medicine

## 2013-09-17 ENCOUNTER — Ambulatory Visit: Payer: Self-pay | Admitting: Internal Medicine

## 2013-09-21 ENCOUNTER — Encounter: Payer: Self-pay | Admitting: Internal Medicine

## 2013-10-18 ENCOUNTER — Encounter: Payer: Self-pay | Admitting: Internal Medicine

## 2013-10-18 ENCOUNTER — Ambulatory Visit (INDEPENDENT_AMBULATORY_CARE_PROVIDER_SITE_OTHER): Payer: Medicaid Other | Admitting: Internal Medicine

## 2013-10-18 VITALS — BP 108/70 | HR 90 | Temp 98.0°F | Ht 71.0 in | Wt 147.0 lb

## 2013-10-18 DIAGNOSIS — J449 Chronic obstructive pulmonary disease, unspecified: Secondary | ICD-10-CM

## 2013-10-18 DIAGNOSIS — G4734 Idiopathic sleep related nonobstructive alveolar hypoventilation: Secondary | ICD-10-CM

## 2013-10-18 DIAGNOSIS — R0902 Hypoxemia: Secondary | ICD-10-CM

## 2013-10-18 DIAGNOSIS — F17200 Nicotine dependence, unspecified, uncomplicated: Secondary | ICD-10-CM

## 2013-10-18 MED ORDER — ALPRAZOLAM 0.5 MG PO TABS: ORAL_TABLET | ORAL | Status: DC

## 2013-10-18 NOTE — Progress Notes (Signed)
Subjective:     Patient ID: Courtney Terry, female   DOB: [birth year] MRN: [id number]  Brief patient profile:  86 yobf smoker with new sob 2014 referred to pulmonary clinic 02/26/2013 for ? Copd by [facility]ists. Sees NP  Yeboah.with GOLD IV copd by pft's 04/15/13   HPI 02/26/2013 1st pulmonary ov/ Lavaughn Haberle Chief Complaint  Patient presents with  . Acute Visit    Pt c/o increased SOB for the past wk. She states gets SOB just walking short distances such as from lobby to exam room today and also sometimes gets SOB at rest. She has also noticed increased cough and wheezing. Cough is prod with moderate yellow to clear sputum.   indolent onset, variable sob activity and at rest x 3 months better on prednisone with apparent over use of saba at baseline with more day than night time symptoms. Transiently better p saba rec The key is to stop smoking completely before smoking completely stops you!  Prednisone 10 mg take  4 each am x 2 days,   2 each am x 2 days,  1 each am x2days and stop  Plan A= automatic = dulera 200 Take 2 puffs first thing in am and then another 2 puffs about 12 hours later.  Plan B = Backup = Only use your albuterol (red inhaler = proaire) as a rescue medication     04/15/2013 f/u ov/Thersa Mohiuddin re gold iv copd/ still smoking Chief Complaint  Patient presents with  . Followup with PFT    Pt states her breathing has improved slightly since the last visit. She c/o nose bleeds on and off for the past 3 wks.    doe x greater than slow walk or > 100 ft, less saba use daytime rec  key is to stop smoking completely before smoking completely stops you!  Work on inhaler technique:     05/28/2013 f/u ov/Marquisa Salih copd G IV stop smoking around mid July Chief Complaint  Patient presents with  . COPD    Breathing has gotten worse since last OV. Has to been to the hospital. Reports her breathing is worse at night.  sob sitting still, worse lying down, x 50 ft  rec Plan A = automatic = dulera  200 2 puffs and Tudorza one puff repeat every 12 hours Plan B = backup = albuterol = proaire/ ventolin >  Only use  l as a rescue medication to be used if you can't catch your breath by resting or doing a relaxed purse lip breathing pattern. The less you use it, the better it will work when you need it. Ok to use up to 2 puffs every 4 hours Plan C = xanax (alprazolam) take 0.5 mg every 4 hours if can't get your breath after using Plan B first   06/03/13  Urine POS cocaine    08/12/2013 f/u ov/Lorrene Graef still smoking  re: copd worse off tudorza  Chief Complaint  Patient presents with  . Follow-up    Pt states DOE worse for the past 3 wks. Gets SOB walking approx 100 ft. Ventolin twice daily for past few weeks  rec Plan A = automatic = dulera 200 2 puffs and Tudorza one puff repeat every 12 hours Plan B = backup = albuterol = proaire/ ventolin >  Only use  l as a rescue medication to be used if you can't catch your breath by resting or doing a relaxed purse lip breathing pattern. The less you use it, the better it will  work when you need it. Ok to use up to 2 puffs every 4 hours Plan C = xanax (alprazolam) take 0.5 mg every 4 hours if can't get your breath after using Plan B first  08/16/13 ER note admitted to ER Doctor use of crack cocaine   08/26/2013 f/u ov/Iliyana Convey re: copd/ confused with meds, all inhalers on 0 Demonstrated read back last ov instructions but admitted not following  Chief Complaint  Patient presents with  . Follow-up    Pt c/o increased SOB since her last appt. She states she gets SOB walking short distances. She states has used rescue inhaler 3 times in the past wk.   wakes up with 02 on 4/7 ams with some cough >  mostly white mucus Not able to use dulera or tudorza first thing in am as rec rec Plan A = automatic = dulera 200 2 puffs and tudorza one puff first thing in am and repeat 12 hours later Plan B= backup = ventolin up to 4 puffs every 4 hours if needed For anxiety can  take the xanax up to every 4 hours if needed Please schedule a follow up office visit in 2 weeks> no showed   10/18/2013 f/u ov/Tatsuya Okray re: COPD ? GOLD IV Chief Complaint  Patient presents with  . Follow-up    Pt states her breathing is doing well today. Has not needed to use her rescue inhaler.   no tudorza in over a week No need for saba at all  Xanax works - "the best resp pill I ever got"    No obvious  daytime variabilty or assoc cough or  cp or chest tightness, subjective wheeze overt sinus or hb symptoms. No unusual exp hx or h/o childhood pna/ asthma or premature birth to her knowledge.   Sleeping ok without nocturnal  or early am exacerbation  of respiratory  c/o's or need for noct saba. Also denies any obvious fluctuation of symptoms with weather or environmental changes or other aggravating or alleviating factors except as outlined above   Current Medications, Allergies, Past Medical History, Past Surgical History, Family History, and Social History were reviewed in [employer] record.  ROS  The following are not active complaints unless bolded sore throat, dysphagia, dental problems, itching, sneezing,  nasal congestion or excess/ purulent secretions, ear ache,   fever, chills, sweats, unintended wt loss, pleuritic or exertional cp, hemoptysis,  orthopnea pnd or leg swelling, presyncope, palpitations, heartburn, abdominal pain, anorexia, nausea, vomiting, diarrhea  or change in bowel or urinary habits, change in stools or urine, dysuria,hematuria,  rash, arthralgias, visual complaints, headache, numbness weakness or ataxia or problems with walking or coordination,  change in mood/affect or memory.            Objective:   Physical Exam   04/15/2013     134  Vs 142  05/28/13 > 08/12/2013 144 > 08/26/2013  145 > 147  10/18/2013  Wt Readings from Last 3 Encounters:  02/26/13 139 lb 6.4 oz (63.231 kg)  06/23/12 149 lb (67.586 kg)  03/06/12 175 lb (79.379 kg)     amb anxious bf nad unusual affect  Patient failed to answer a single question asked in a straightforward manner  HEENT mild turbinate edema.  Oropharynx no thrush or excess pnd or cobblestoning.  No JVD or cervical adenopathy. Mild accessory muscle hypertrophy. Trachea midline, nl thryroid. Chest was hyperinflated by percussion with diminished breath sounds and moderate increased exp time with trace  end exp wheeze. Hoover sign positive at mid inspiration. Regular rate and rhythm without murmur gallop or rub or increase P2 or edema.  Abd: no hsm, nl excursion. Ext warm without cyanosis or clubbing.       cxr  08/16/13 There is marked hyperinflation consistent with COPD. There is no  evidence of pneumonia nor of CHF.    Assessment:             Outpatient Encounter Prescriptions as of 10/18/2013  Medication Sig  . albuterol (PROVENTIL HFA;VENTOLIN HFA) 108 (90 BASE) MCG/ACT inhaler Inhale 2 puffs into the lungs every 6 (six) hours as needed for wheezing or shortness of breath.   . ALPRAZolam (XANAX) 0.5 MG tablet One up to every 4 hours if needed for anxiety related to breathing  . divalproex (DEPAKOTE) 500 MG DR tablet Take 1,000 mg by mouth once a week.   . mometasone-formoterol (DULERA) 200-5 MCG/ACT AERO Inhale 2 puffs into the lungs 2 (two) times daily.  . [DISCONTINUED] ALPRAZolam (XANAX) 0.5 MG tablet One up to every 4 hours if needed for anxiety related to breathing  . [DISCONTINUED] Aclidinium Bromide (TUDORZA PRESSAIR) 400 MCG/ACT AEPB Inhale 1 puff into the lungs 2 (two) times daily.  . [DISCONTINUED] ALPRAZolam (XANAX) 0.5 MG tablet Take 1 tablet (0.5 mg total) by mouth at bedtime as needed for anxiety.  . [DISCONTINUED] levothyroxine (SYNTHROID, LEVOTHROID) 75 MCG tablet Take 75 mcg by mouth daily.  . [DISCONTINUED] vitamin E 400 UNIT capsule Take 400 Units by mouth daily.

## 2013-10-18 NOTE — Patient Instructions (Addendum)
Plan A = automatic = Dulera 200 Take 2 puffs first thing in am and then another 2 puffs about 12 hours later.   Plan B= backup = Proair  up to 4 puffs every 4 hours if needed to catch your breaths  For anxiety can take the xanax up to every 4 hours if needed and let your primary doctor refill it  The key is to stop smoking completely before smoking completely stops you!   Please remember to go to the lab  department downstairs for your tests - we will call you with the results when they are available.    If you are satisfied with your treatment plan let your doctor know and he/she can either refill your medications or you can return here when your prescription runs out.     If in any way you are not 100% satisfied,  please tell us.  If 100% better, tell your friends!

## 2013-10-19 NOTE — Assessment & Plan Note (Addendum)
-   PFT's 04/15/13 FEV1  0.56 (18%) ratio 37 and 27% better p B2  DLCO 39 corrects to 60 - added tudorza one bid 05/28/2013 > stopped 10/07/13 no change  - 05/28/2013   Walked RA x one lap @ 185 stopped due to  Sob no tachypnea with sat 93%  - 08/12/2013  Walked RA x 3 laps @ 185 ft each stopped due to end of study sats 88% - 08/26/2013   Walked RA x one lap @ 185 stopped due to sob with sat 97%  - spirometry 10/18/2013   0.99 (36%) ratio 40  - Alpha one genotype 10/18/2013 >>>   - 10/18/2013 p extensive coaching HFA effectiveness =    75% from baseline of < 25%  Despite the smoking and poor hfa technique  she is doing ok on just dulera 200 2bid s need for albuterol since adding xanax; however, she is not endstage and I'm not comfortable with supplying her with unlimited xanax for her anxiety without imput from her primary or even psychiatry and will in the future limit my focus to her pulmonary care on a prn basis if her saba need increases again - I would note for the record though that 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

## 2013-10-19 NOTE — Assessment & Plan Note (Signed)
>   3 min  I took an extended  opportunity with this patient to outline the consequences of continued cigarette use  in airway disorders based on all the data we have from the multiple national lung health studies (perfomed over decades at millions of dollars in cost)  indicating that smoking cessation, not choice of inhalers or physicians, is the most important aspect of  Her care.    Pulmonary f/u is prn refills of pulmonary meds  if not supplied by her primary doctor

## 2013-10-19 NOTE — Assessment & Plan Note (Signed)
-   ono RA  06/11/13 <89  42 m and 28 s > rec 2lpm hs only   Adequate control on present rx, reviewed > no change in rx needed

## 2013-11-03 ENCOUNTER — Telehealth: Payer: Self-pay | Admitting: Internal Medicine

## 2013-11-03 NOTE — Telephone Encounter (Signed)
Per Nere[facility] at [facility] will be approved through 09/2014 Approval # [id number] P. Will notify pt and nothing further is needed

## 2013-11-10 ENCOUNTER — Telehealth: Payer: Self-pay | Admitting: Oncology

## 2013-11-10 NOTE — Telephone Encounter (Signed)
Faxed pt medical records to [facility] °

## 2013-11-22 ENCOUNTER — Emergency Department (HOSPITAL_COMMUNITY)
Admission: EM | Admit: 2013-11-22 | Discharge: 2013-11-22 | Disposition: A | Discharge status: Home / Self Care | Payer: Medicaid Other | Attending: Emergency Medicine | Admitting: Emergency Medicine

## 2013-11-22 ENCOUNTER — Encounter (HOSPITAL_COMMUNITY): Payer: Self-pay | Admitting: Emergency Medicine

## 2013-11-22 ENCOUNTER — Emergency Department (HOSPITAL_COMMUNITY): Payer: Medicaid Other

## 2013-11-22 DIAGNOSIS — F411 Generalized anxiety disorder: Secondary | ICD-10-CM | POA: Insufficient documentation

## 2013-11-22 DIAGNOSIS — F17200 Nicotine dependence, unspecified, uncomplicated: Secondary | ICD-10-CM | POA: Insufficient documentation

## 2013-11-22 DIAGNOSIS — Z79899 Other long term (current) drug therapy: Secondary | ICD-10-CM | POA: Insufficient documentation

## 2013-11-22 DIAGNOSIS — Z9089 Acquired absence of other organs: Secondary | ICD-10-CM | POA: Insufficient documentation

## 2013-11-22 DIAGNOSIS — Z8739 Personal history of other diseases of the musculoskeletal system and connective tissue: Secondary | ICD-10-CM | POA: Insufficient documentation

## 2013-11-22 DIAGNOSIS — J45901 Unspecified asthma with (acute) exacerbation: Principal | ICD-10-CM

## 2013-11-22 DIAGNOSIS — J449 Chronic obstructive pulmonary disease, unspecified: Secondary | ICD-10-CM

## 2013-11-22 DIAGNOSIS — F319 Bipolar disorder, unspecified: Secondary | ICD-10-CM | POA: Insufficient documentation

## 2013-11-22 DIAGNOSIS — R630 Anorexia: Secondary | ICD-10-CM | POA: Insufficient documentation

## 2013-11-22 DIAGNOSIS — Z853 Personal history of malignant neoplasm of breast: Secondary | ICD-10-CM | POA: Insufficient documentation

## 2013-11-22 DIAGNOSIS — G40909 Epilepsy, unspecified, not intractable, without status epilepticus: Secondary | ICD-10-CM | POA: Insufficient documentation

## 2013-11-22 DIAGNOSIS — J441 Chronic obstructive pulmonary disease with (acute) exacerbation: Secondary | ICD-10-CM | POA: Insufficient documentation

## 2013-11-22 DIAGNOSIS — IMO0002 Reserved for concepts with insufficient information to code with codable children: Secondary | ICD-10-CM | POA: Insufficient documentation

## 2013-11-22 DIAGNOSIS — F4310 Post-traumatic stress disorder, unspecified: Secondary | ICD-10-CM | POA: Insufficient documentation

## 2013-11-22 DIAGNOSIS — Z9981 Dependence on supplemental oxygen: Secondary | ICD-10-CM | POA: Insufficient documentation

## 2013-11-22 DIAGNOSIS — Z862 Personal history of diseases of the blood and blood-forming organs and certain disorders involving the immune mechanism: Secondary | ICD-10-CM | POA: Insufficient documentation

## 2013-11-22 DIAGNOSIS — Z8639 Personal history of other endocrine, nutritional and metabolic disease: Secondary | ICD-10-CM | POA: Insufficient documentation

## 2013-11-22 LAB — CBC
HCT: 40.6 % (ref 36.0–46.0)
HEMOGLOBIN: 13.9 g/dL (ref 12.0–15.0)
MCH: 28.4 pg (ref 26.0–34.0)
MCHC: 34.2 g/dL (ref 30.0–36.0)
MCV: 82.9 fL (ref 78.0–100.0)
Platelets: 296 10*3/uL (ref 150–400)
RBC: 4.9 MIL/uL (ref 3.87–5.11)
RDW: 15.3 % (ref 11.5–15.5)
WBC: 8.6 10*3/uL (ref 4.0–10.5)

## 2013-11-22 LAB — POCT I-STAT TROPONIN I: Troponin i, poc: 0.02 ng/mL (ref 0.00–0.08)

## 2013-11-22 LAB — BASIC METABOLIC PANEL
BUN: 13 mg/dL (ref 6–23)
CO2: 23 mEq/L (ref 19–32)
CREATININE: 0.71 mg/dL (ref 0.50–1.10)
Calcium: 9.5 mg/dL (ref 8.4–10.5)
Chloride: 106 mEq/L (ref 96–112)
Glucose, Bld: 87 mg/dL (ref 70–99)
POTASSIUM: 4.2 meq/L (ref 3.7–5.3)
Sodium: 144 mEq/L (ref 137–147)

## 2013-11-22 LAB — PRO B NATRIURETIC PEPTIDE: Pro B Natriuretic peptide (BNP): 39.2 pg/mL (ref 0–125)

## 2013-11-22 LAB — D-DIMER, QUANTITATIVE: D-Dimer, Quant: <0.27 ug/mL-FEU (ref 0.00–0.48)

## 2013-11-22 MED ORDER — PREDNISONE 20 MG PO TABS
60.0000 mg | ORAL_TABLET | Freq: Once | ORAL | Status: AC
  Administered 2013-11-22: 60 mg via ORAL
  Filled 2013-11-22: qty 3

## 2013-11-22 MED ORDER — IPRATROPIUM-ALBUTEROL 0.5-2.5 (3) MG/3ML IN SOLN
3.0000 mL | Freq: Once | RESPIRATORY_TRACT | Status: AC
  Administered 2013-11-22: 3 mL via RESPIRATORY_TRACT
  Filled 2013-11-22: qty 3

## 2013-11-22 MED ORDER — ALBUTEROL SULFATE HFA 108 (90 BASE) MCG/ACT IN AERS: 1.0000 | INHALATION_SPRAY | Freq: Four times a day (QID) | RESPIRATORY_TRACT | Status: DC | PRN

## 2013-11-22 MED ORDER — ALBUTEROL SULFATE (2.5 MG/3ML) 0.083% IN NEBU
5.0000 mg | INHALATION_SOLUTION | Freq: Once | RESPIRATORY_TRACT | Status: AC
  Administered 2013-11-22: 5 mg via RESPIRATORY_TRACT
  Filled 2013-11-22: qty 6

## 2013-11-22 MED ORDER — PREDNISONE 50 MG PO TABS: ORAL_TABLET | ORAL | Status: DC

## 2013-11-22 NOTE — ED Notes (Signed)
Pt is here with sob and is on home 02.  Pt has asthma and sob.  Pt states she also wants to have knots on her arms checked

## 2013-11-22 NOTE — Discharge Instructions (Signed)
Chronic Obstructive Pulmonary Disease  Chronic obstructive pulmonary disease (COPD) is a common lung condition in which airflow from the lungs is limited. COPD is a general term that can be used to describe many different lung problems that limit airflow, including both chronic bronchitis and emphysema.  If you have COPD, your lung function will probably never return to normal, but there are measures you can take to improve lung function and make yourself feel better.   CAUSES   · Smoking (common).    · Exposure to secondhand smoke.    · Genetic problems.  · Chronic inflammatory lung diseases or recurrent infections.  SYMPTOMS   · Shortness of breath, especially with physical activity.    · Deep, persistent (chronic) cough with a large amount of thick mucus.    · Wheezing.    · Rapid breaths (tachypnea).    · Gray or bluish discoloration (cyanosis) of the skin, especially in fingers, toes, or lips.    · Fatigue.    · Weight loss.    · Frequent infections or episodes when breathing symptoms become much worse (exacerbations).    · Chest tightness.  DIAGNOSIS   Your healthcare provider will take a medical history and perform a physical examination to make the initial diagnosis.  Additional tests for COPD may include:   · Lung (pulmonary) function tests.  · Chest X-ray.  · CT scan.  · Blood tests.  TREATMENT   Treatment available to help you feel better when you have COPD include:   · Inhaler and nebulizer medicines. These help manage the symptoms of COPD and make your breathing more comfortable  · Supplemental oxygen. Supplemental oxygen is only helpful if you have a low oxygen level in your blood.    · Exercise and physical activity. These are beneficial for nearly all people with COPD. Some people may also benefit from a pulmonary rehabilitation program.  HOME CARE INSTRUCTIONS   · Take all medicines (inhaled or pills) as directed by your health care provider.  · Only take over-the-counter or prescription medicines  for pain, fever, or discomfort as directed by your health care provider.    · Avoid over-the-counter medicines or cough syrups that dry up your airway (such as antihistamines) and slow down the elimination of secretions unless instructed otherwise by your healthcare provider.    · If you are a smoker, the most important thing that you can do is stop smoking. Continuing to smoke will cause further lung damage and breathing trouble. Ask your health care provider for help with quitting smoking. He or she can direct you to community resources or hospitals that provide support.  · Avoid exposure to irritants such as smoke, chemicals, and fumes that aggravate your breathing.  · Use oxygen therapy and pulmonary rehabilitation if directed by your health care provider. If you require home oxygen therapy, ask your healthcare provider whether you should purchase a pulse oximeter to measure your oxygen level at home.    · Avoid contact with individuals who have a contagious illness.  · Avoid extreme temperature and humidity changes.  · Eat healthy foods. Eating smaller, more frequent meals and resting before meals may help you maintain your strength.  · Stay active, but balance activity with periods of rest. Exercise and physical activity will help you maintain your ability to do things you want to do.  · Preventing infection and hospitalization is very important when you have COPD. Make sure to receive all the vaccines your health care provider recommends, especially the pneumococcal and influenza vaccines. Ask your healthcare provider whether you   need a pneumonia vaccine.  · Learn and use relaxation techniques to manage stress.  · Learn and use controlled breathing techniques as directed by your health care provider. Controlled breathing techniques include:    · Pursed lip breathing. Start by breathing in (inhaling) through your nose for 1 second. Then, purse your lips as if you were going to whistle and breathe out (exhale)  through the pursed lips for 2 seconds.    · Diaphragmatic breathing. Start by putting one hand on your abdomen just above your waist. Inhale slowly through your nose. The hand on your abdomen should move out. Then purse your lips and exhale slowly. You should be able to feel the hand on your abdomen moving in as you exhale.    · Learn and use controlled coughing to clear mucus from your lungs. Controlled coughing is a series of short, progressive coughs. The steps of controlled coughing are:    1. Lean your head slightly forward.    2. Breathe in deeply using diaphragmatic breathing.    3. Try to hold your breath for 3 seconds.    4. Keep your mouth slightly open while coughing twice.    5. Spit any mucus out into a tissue.    6. Rest and repeat the steps once or twice as needed.  SEEK MEDICAL CARE IF:   · You are coughing up more mucus than usual.    · There is a change in the color or thickness of your mucus.    · Your breathing is more labored than usual.    · Your breathing is faster than usual.    SEEK IMMEDIATE MEDICAL CARE IF:   · You have shortness of breath while you are resting.    · You have shortness of breath that prevents you from:  · Being able to talk.    · Performing your usual physical activities.    · You have chest pain lasting longer than 5 minutes.    · Your skin color is more cyanotic than usual.  · You measure low oxygen saturations for longer than 5 minutes with a pulse oximeter.  MAKE SURE YOU:   · Understand these instructions.  · Will watch your condition.  · Will get help right away if you are not doing well or get worse.  Document Released: 07/03/2005 Document Revised: 07/14/2013 Document Reviewed: 05/20/2013  [employer]® Patient Information ©2014 [employer].

## 2013-11-22 NOTE — ED Notes (Signed)
This rn contacted phleb- advised pt willing to provide blood for d-dimer. Phleb advised enroute.
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
## 2013-12-29 ENCOUNTER — Encounter (HOSPITAL_COMMUNITY): Payer: Self-pay | Admitting: Emergency Medicine

## 2013-12-29 ENCOUNTER — Emergency Department (HOSPITAL_COMMUNITY)
Admission: EM | Admit: 2013-12-29 | Discharge: 2013-12-29 | Disposition: A | Discharge status: Home / Self Care | Payer: Medicaid Other | Attending: Emergency Medicine | Admitting: Emergency Medicine

## 2013-12-29 ENCOUNTER — Emergency Department (HOSPITAL_COMMUNITY): Payer: Medicaid Other

## 2013-12-29 DIAGNOSIS — F17200 Nicotine dependence, unspecified, uncomplicated: Secondary | ICD-10-CM | POA: Insufficient documentation

## 2013-12-29 DIAGNOSIS — M129 Arthropathy, unspecified: Secondary | ICD-10-CM | POA: Insufficient documentation

## 2013-12-29 DIAGNOSIS — R197 Diarrhea, unspecified: Secondary | ICD-10-CM | POA: Insufficient documentation

## 2013-12-29 DIAGNOSIS — Z853 Personal history of malignant neoplasm of breast: Secondary | ICD-10-CM | POA: Insufficient documentation

## 2013-12-29 DIAGNOSIS — Z79899 Other long term (current) drug therapy: Secondary | ICD-10-CM | POA: Insufficient documentation

## 2013-12-29 DIAGNOSIS — J449 Chronic obstructive pulmonary disease, unspecified: Secondary | ICD-10-CM | POA: Insufficient documentation

## 2013-12-29 DIAGNOSIS — F4310 Post-traumatic stress disorder, unspecified: Secondary | ICD-10-CM | POA: Insufficient documentation

## 2013-12-29 DIAGNOSIS — J4489 Other specified chronic obstructive pulmonary disease: Secondary | ICD-10-CM | POA: Insufficient documentation

## 2013-12-29 DIAGNOSIS — Z9889 Other specified postprocedural states: Secondary | ICD-10-CM | POA: Insufficient documentation

## 2013-12-29 DIAGNOSIS — R112 Nausea with vomiting, unspecified: Secondary | ICD-10-CM | POA: Insufficient documentation

## 2013-12-29 DIAGNOSIS — G40909 Epilepsy, unspecified, not intractable, without status epilepticus: Secondary | ICD-10-CM | POA: Insufficient documentation

## 2013-12-29 DIAGNOSIS — F411 Generalized anxiety disorder: Secondary | ICD-10-CM | POA: Insufficient documentation

## 2013-12-29 DIAGNOSIS — Z862 Personal history of diseases of the blood and blood-forming organs and certain disorders involving the immune mechanism: Secondary | ICD-10-CM | POA: Insufficient documentation

## 2013-12-29 DIAGNOSIS — R1011 Right upper quadrant pain: Secondary | ICD-10-CM | POA: Insufficient documentation

## 2013-12-29 DIAGNOSIS — IMO0002 Reserved for concepts with insufficient information to code with codable children: Secondary | ICD-10-CM | POA: Insufficient documentation

## 2013-12-29 DIAGNOSIS — Z8639 Personal history of other endocrine, nutritional and metabolic disease: Secondary | ICD-10-CM | POA: Insufficient documentation

## 2013-12-29 DIAGNOSIS — F319 Bipolar disorder, unspecified: Secondary | ICD-10-CM | POA: Insufficient documentation

## 2013-12-29 LAB — COMPREHENSIVE METABOLIC PANEL
ALBUMIN: 3.7 g/dL (ref 3.5–5.2)
ALT: 8 U/L (ref 0–35)
AST: 15 U/L (ref 0–37)
Alkaline Phosphatase: 125 U/L — ABNORMAL HIGH (ref 39–117)
BUN: 11 mg/dL (ref 6–23)
CALCIUM: 9.9 mg/dL (ref 8.4–10.5)
CO2: 28 mEq/L (ref 19–32)
Chloride: 104 mEq/L (ref 96–112)
Creatinine, Ser: 0.85 mg/dL (ref 0.50–1.10)
GFR calc Af Amer: >90 mL/min (ref >90)
GFR calc non Af Amer: 78 mL/min — ABNORMAL LOW (ref >90)
Glucose, Bld: 95 mg/dL (ref 70–99)
Potassium: 4 mEq/L (ref 3.7–5.3)
Sodium: 141 mEq/L (ref 137–147)
Total Bilirubin: 0.3 mg/dL (ref 0.3–1.2)
Total Protein: 7.7 g/dL (ref 6.0–8.3)

## 2013-12-29 LAB — URINALYSIS, ROUTINE W REFLEX MICROSCOPIC
Glucose, UA: NEGATIVE mg/dL
Hgb urine dipstick: NEGATIVE
Ketones, ur: NEGATIVE mg/dL
LEUKOCYTES UA: NEGATIVE
Nitrite: NEGATIVE
PH: 5.5 (ref 5.0–8.0)
Protein, ur: NEGATIVE mg/dL
SPECIFIC GRAVITY, URINE: 1.029 (ref 1.005–1.030)
Urobilinogen, UA: 1 mg/dL (ref 0.0–1.0)

## 2013-12-29 LAB — CBC WITH DIFFERENTIAL/PLATELET
BASOS ABS: 0 10*3/uL (ref 0.0–0.1)
BASOS PCT: 1 % (ref 0–1)
EOS PCT: 1 % (ref 0–5)
Eosinophils Absolute: 0.1 10*3/uL (ref 0.0–0.7)
HEMATOCRIT: 45.5 % (ref 36.0–46.0)
Hemoglobin: 15 g/dL (ref 12.0–15.0)
Lymphocytes Relative: 27 % (ref 12–46)
Lymphs Abs: 2.3 10*3/uL (ref 0.7–4.0)
MCH: 27.6 pg (ref 26.0–34.0)
MCHC: 33 g/dL (ref 30.0–36.0)
MCV: 83.8 fL (ref 78.0–100.0)
MONO ABS: 0.5 10*3/uL (ref 0.1–1.0)
Monocytes Relative: 5 % (ref 3–12)
NEUTROS ABS: 5.6 10*3/uL (ref 1.7–7.7)
Neutrophils Relative %: 66 % (ref 43–77)
PLATELETS: 309 10*3/uL (ref 150–400)
RBC: 5.43 MIL/uL — ABNORMAL HIGH (ref 3.87–5.11)
RDW: 15.5 % (ref 11.5–15.5)
WBC: 8.4 10*3/uL (ref 4.0–10.5)

## 2013-12-29 LAB — LIPASE, BLOOD: Lipase: 29 U/L (ref 11–59)

## 2013-12-29 MED ORDER — OXYCODONE-ACETAMINOPHEN 5-325 MG PO TABS: 1.0000 | ORAL_TABLET | Freq: Four times a day (QID) | ORAL | Status: DC | PRN

## 2013-12-29 MED ORDER — MORPHINE SULFATE 4 MG/ML IJ SOLN
4.0000 mg | Freq: Once | INTRAMUSCULAR | Status: AC
  Administered 2013-12-29: 4 mg via INTRAVENOUS
  Filled 2013-12-29: qty 1

## 2013-12-29 MED ORDER — GI COCKTAIL ~~LOC~~
30.0000 mL | Freq: Once | ORAL | Status: AC
  Administered 2013-12-29: 30 mL via ORAL
  Filled 2013-12-29: qty 30

## 2013-12-29 MED ORDER — ONDANSETRON 4 MG PO TBDP
4.0000 mg | ORAL_TABLET | Freq: Once | ORAL | Status: AC
  Administered 2013-12-29: 4 mg via ORAL
  Filled 2013-12-29: qty 1

## 2013-12-29 MED ORDER — OXYCODONE-ACETAMINOPHEN 5-325 MG PO TABS
2.0000 | ORAL_TABLET | Freq: Once | ORAL | Status: AC
  Administered 2013-12-29: 2 via ORAL
  Filled 2013-12-29: qty 2
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
## 2013-12-29 NOTE — Discharge Instructions (Signed)
Abdominal Pain, Women °Abdominal (stomach, pelvic, or belly) pain can be caused by many things. It is important to tell your doctor: °· The location of the pain. °· Does it come and go or is it present all the time? °· Are there things that start the pain (eating certain foods, exercise)? °· Are there other symptoms associated with the pain (fever, nausea, vomiting, diarrhea)? °All of this is helpful to know when trying to find the cause of the pain. °CAUSES  °· Stomach: virus or bacteria infection, or ulcer. °· Intestine: appendicitis (inflamed appendix), regional ileitis (Crohn's disease), ulcerative colitis (inflamed colon), irritable bowel syndrome, diverticulitis (inflamed diverticulum of the colon), or cancer of the stomach or intestine. °· Gallbladder disease or stones in the gallbladder. °· Kidney disease, kidney stones, or infection. °· Pancreas infection or cancer. °· Fibromyalgia (pain disorder). °· Diseases of the female organs: °· Uterus: fibroid (non-cancerous) tumors or infection. °· Fallopian tubes: infection or tubal pregnancy. °· Ovary: cysts or tumors. °· Pelvic adhesions (scar tissue). °· Endometriosis (uterus lining tissue growing in the pelvis and on the pelvic organs). °· Pelvic congestion syndrome (female organs filling up with blood just before the menstrual period). °· Pain with the menstrual period. °· Pain with ovulation (producing an egg). °· Pain with an IUD (intrauterine device, birth control) in the uterus. °· Cancer of the female organs. °· Functional pain (pain not caused by a disease, may improve without treatment). °· Psychological pain. °· Depression. °DIAGNOSIS  °Your doctor will decide the seriousness of your pain by doing an examination. °· Blood tests. °· X-rays. °· Ultrasound. °· CT scan (computed tomography, special type of X-ray). °· MRI (magnetic resonance imaging). °· Cultures, for infection. °· Barium enema (dye inserted in the large intestine, to better view it with  X-rays). °· Colonoscopy (looking in intestine with a lighted tube). °· Laparoscopy (minor surgery, looking in abdomen with a lighted tube). °· Major abdominal exploratory surgery (looking in abdomen with a large incision). °TREATMENT  °The treatment will depend on the cause of the pain.  °· Many cases can be observed and treated at home. °· Over-the-counter medicines recommended by your caregiver. °· Prescription medicine. °· Antibiotics, for infection. °· Birth control pills, for painful periods or for ovulation pain. °· Hormone treatment, for endometriosis. °· Nerve blocking injections. °· Physical therapy. °· Antidepressants. °· Counseling with a psychologist or psychiatrist. °· Minor or major surgery. °HOME CARE INSTRUCTIONS  °· Do not take laxatives, unless directed by your caregiver. °· Take over-the-counter pain medicine only if ordered by your caregiver. Do not take aspirin because it can cause an upset stomach or bleeding. °· Try a clear liquid diet (broth or water) as ordered by your caregiver. Slowly move to a bland diet, as tolerated, if the pain is related to the stomach or intestine. °· Have a thermometer and take your temperature several times a day, and record it. °· Bed rest and sleep, if it helps the pain. °· Avoid sexual intercourse, if it causes pain. °· Avoid stressful situations. °· Keep your follow-up appointments and tests, as your caregiver orders. °· If the pain does not go away with medicine or surgery, you may try: °· Acupuncture. °· Relaxation exercises (yoga, meditation). °· Group therapy. °· Counseling. °SEEK MEDICAL CARE IF:  °· You notice certain foods cause stomach pain. °· Your home care treatment is not helping your pain. °· You need stronger pain medicine. °· You want your IUD removed. °· You feel faint or   lightheaded. °· You develop nausea and vomiting. °· You develop a rash. °· You are having side effects or an allergy to your medicine. °SEEK IMMEDIATE MEDICAL CARE IF:  °· Your  pain does not go away or gets worse. °· You have a fever. °· Your pain is felt only in portions of the abdomen. The right side could possibly be appendicitis. The left lower portion of the abdomen could be colitis or diverticulitis. °· You are passing blood in your stools (bright red or black tarry stools, with or without vomiting). °· You have blood in your urine. °· You develop chills, with or without a fever. °· You pass out. °MAKE SURE YOU:  °· Understand these instructions. °· Will watch your condition. °· Will get help right away if you are not doing well or get worse. °Document Released: 07/21/2007 Document Revised: 12/16/2011 Document Reviewed: 08/10/2009 °[employer]® Patient Information ©2014 [employer]. ° °

## 2013-12-29 NOTE — ED Notes (Signed)
Patient provided with sandwich and sprite, reporting pain, percocet given along with RX.

## 2013-12-29 NOTE — ED Notes (Signed)
Patient is aware that she needs to provide a urine specimen and is unable to do so at this time.

## 2013-12-29 NOTE — ED Notes (Signed)
Pt c/o right side abd pain x 3 days also c/o n/v/d, 2 vomiting and 3 diarrhea episodes today.

## 2014-03-06 ENCOUNTER — Emergency Department (HOSPITAL_COMMUNITY)
Admission: EM | Admit: 2014-03-06 | Discharge: 2014-03-06 | Disposition: A | Discharge status: Home / Self Care | Payer: Medicaid Other | Attending: Emergency Medicine | Admitting: Emergency Medicine

## 2014-03-06 ENCOUNTER — Emergency Department (HOSPITAL_COMMUNITY): Payer: Medicaid Other

## 2014-03-06 ENCOUNTER — Encounter (HOSPITAL_COMMUNITY): Payer: Self-pay | Admitting: Emergency Medicine

## 2014-03-06 DIAGNOSIS — J4489 Other specified chronic obstructive pulmonary disease: Secondary | ICD-10-CM | POA: Insufficient documentation

## 2014-03-06 DIAGNOSIS — Z853 Personal history of malignant neoplasm of breast: Secondary | ICD-10-CM | POA: Insufficient documentation

## 2014-03-06 DIAGNOSIS — Z9889 Other specified postprocedural states: Secondary | ICD-10-CM | POA: Insufficient documentation

## 2014-03-06 DIAGNOSIS — F319 Bipolar disorder, unspecified: Secondary | ICD-10-CM | POA: Insufficient documentation

## 2014-03-06 DIAGNOSIS — R1013 Epigastric pain: Secondary | ICD-10-CM | POA: Insufficient documentation

## 2014-03-06 DIAGNOSIS — J449 Chronic obstructive pulmonary disease, unspecified: Secondary | ICD-10-CM | POA: Insufficient documentation

## 2014-03-06 DIAGNOSIS — Z862 Personal history of diseases of the blood and blood-forming organs and certain disorders involving the immune mechanism: Secondary | ICD-10-CM | POA: Insufficient documentation

## 2014-03-06 DIAGNOSIS — Z8639 Personal history of other endocrine, nutritional and metabolic disease: Secondary | ICD-10-CM | POA: Insufficient documentation

## 2014-03-06 DIAGNOSIS — F17200 Nicotine dependence, unspecified, uncomplicated: Secondary | ICD-10-CM | POA: Insufficient documentation

## 2014-03-06 DIAGNOSIS — F411 Generalized anxiety disorder: Secondary | ICD-10-CM | POA: Insufficient documentation

## 2014-03-06 DIAGNOSIS — Z79899 Other long term (current) drug therapy: Secondary | ICD-10-CM | POA: Insufficient documentation

## 2014-03-06 DIAGNOSIS — IMO0002 Reserved for concepts with insufficient information to code with codable children: Secondary | ICD-10-CM | POA: Insufficient documentation

## 2014-03-06 DIAGNOSIS — R109 Unspecified abdominal pain: Secondary | ICD-10-CM

## 2014-03-06 DIAGNOSIS — R1011 Right upper quadrant pain: Secondary | ICD-10-CM | POA: Insufficient documentation

## 2014-03-06 DIAGNOSIS — M129 Arthropathy, unspecified: Secondary | ICD-10-CM | POA: Insufficient documentation

## 2014-03-06 LAB — CBC WITH DIFFERENTIAL/PLATELET
Basophils Absolute: 0 10*3/uL (ref 0.0–0.1)
Basophils Relative: 0 % (ref 0–1)
Eosinophils Absolute: 0.2 10*3/uL (ref 0.0–0.7)
Eosinophils Relative: 2 % (ref 0–5)
HCT: 40.6 % (ref 36.0–46.0)
Hemoglobin: 13.1 g/dL (ref 12.0–15.0)
Lymphocytes Relative: 26 % (ref 12–46)
Lymphs Abs: 2.2 10*3/uL (ref 0.7–4.0)
MCH: 27.3 pg (ref 26.0–34.0)
MCHC: 32.3 g/dL (ref 30.0–36.0)
MCV: 84.6 fL (ref 78.0–100.0)
Monocytes Absolute: 0.6 10*3/uL (ref 0.1–1.0)
Monocytes Relative: 6 % (ref 3–12)
Neutro Abs: 5.7 10*3/uL (ref 1.7–7.7)
Neutrophils Relative %: 66 % (ref 43–77)
Platelets: 276 10*3/uL (ref 150–400)
RBC: 4.8 MIL/uL (ref 3.87–5.11)
RDW: 15.4 % (ref 11.5–15.5)
WBC: 8.7 10*3/uL (ref 4.0–10.5)

## 2014-03-06 LAB — URINALYSIS, ROUTINE W REFLEX MICROSCOPIC
Bilirubin Urine: NEGATIVE
Glucose, UA: NEGATIVE mg/dL
Hgb urine dipstick: NEGATIVE
Ketones, ur: NEGATIVE mg/dL
Leukocytes, UA: NEGATIVE
Nitrite: NEGATIVE
Protein, ur: NEGATIVE mg/dL
Specific Gravity, Urine: 1.021 (ref 1.005–1.030)
Urobilinogen, UA: 0.2 mg/dL (ref 0.0–1.0)
pH: 8 (ref 5.0–8.0)

## 2014-03-06 LAB — COMPREHENSIVE METABOLIC PANEL
ALT: 9 U/L (ref 0–35)
AST: 15 U/L (ref 0–37)
Albumin: 3.5 g/dL (ref 3.5–5.2)
Alkaline Phosphatase: 128 U/L — ABNORMAL HIGH (ref 39–117)
BUN: 17 mg/dL (ref 6–23)
CO2: 26 mEq/L (ref 19–32)
Calcium: 9.4 mg/dL (ref 8.4–10.5)
Chloride: 108 mEq/L (ref 96–112)
Creatinine, Ser: 0.84 mg/dL (ref 0.50–1.10)
GFR calc Af Amer: >90 mL/min (ref >90)
GFR calc non Af Amer: 79 mL/min — ABNORMAL LOW (ref >90)
Glucose, Bld: 99 mg/dL (ref 70–99)
Potassium: 4.5 mEq/L (ref 3.7–5.3)
Sodium: 144 mEq/L (ref 137–147)
Total Bilirubin: <0.2 mg/dL — ABNORMAL LOW (ref 0.3–1.2)
Total Protein: 7.2 g/dL (ref 6.0–8.3)

## 2014-03-06 LAB — LIPASE, BLOOD: Lipase: 39 U/L (ref 11–59)

## 2014-03-06 LAB — I-STAT CG4 LACTIC ACID, ED: Lactic Acid, Venous: 0.74 mmol/L (ref 0.5–2.2)

## 2014-03-06 MED ORDER — SODIUM CHLORIDE 0.9 % IV BOLUS (SEPSIS)
1000.0000 mL | Freq: Once | INTRAVENOUS | Status: AC
  Administered 2014-03-06: 1000 mL via INTRAVENOUS

## 2014-03-06 MED ORDER — FENTANYL CITRATE 0.05 MG/ML IJ SOLN: 100.0000 ug | Freq: Once | INTRAMUSCULAR | Status: DC

## 2014-03-06 MED ORDER — MORPHINE SULFATE 4 MG/ML IJ SOLN
4.0000 mg | Freq: Once | INTRAMUSCULAR | Status: AC
  Administered 2014-03-06: 4 mg via INTRAVENOUS
  Filled 2014-03-06: qty 1

## 2014-03-06 MED ORDER — KETOROLAC TROMETHAMINE 30 MG/ML IJ SOLN
30.0000 mg | Freq: Once | INTRAMUSCULAR | Status: DC
Start: 2014-03-06 — End: 2014-03-06

## 2014-03-06 MED ORDER — DICYCLOMINE HCL 20 MG PO TABS: 20.0000 mg | ORAL_TABLET | Freq: Two times a day (BID) | ORAL | Status: DC

## 2014-03-06 MED ORDER — ONDANSETRON HCL 4 MG/2ML IJ SOLN
4.0000 mg | Freq: Once | INTRAMUSCULAR | Status: AC
  Administered 2014-03-06: 4 mg via INTRAVENOUS
  Filled 2014-03-06: qty 2

## 2014-03-06 MED ORDER — HYDROCODONE-ACETAMINOPHEN 5-325 MG PO TABS: 1.0000 | ORAL_TABLET | Freq: Four times a day (QID) | ORAL | Status: DC | PRN

## 2014-03-06 NOTE — Discharge Instructions (Signed)
Return here as needed.  Followup with your primary care Dr. increase your fluid intake °

## 2014-03-06 NOTE — ED Notes (Signed)
Pt reminded of need for urine specimen 

## 2014-03-06 NOTE — ED Notes (Signed)
Patient states that her friend is here and she wants to go home Patient refuses additional testing or medications--repeatedly asking for "a note for my bossman." PA made aware and patient is up for DC Work note given to patient at time of DC

## 2014-03-06 NOTE — ED Notes (Signed)
During DC process, patient states that she wants work note for a whole week Patient informed that she will not be given a work note excusing her for an entire week Patient becoming loud, disruptive and refusing to leave Camera operator informed Security called

## 2014-03-06 NOTE — ED Notes (Signed)
D/c IV at time of discharge

## 2014-03-06 NOTE — ED Notes (Signed)
Pt reports R rib/abd pain since last night. Same pain last month, treated as gas with relief. Pt ate cabbage and pintos last night and pain started after that. BM this am, watery.

## 2014-03-06 NOTE — ED Provider Notes (Signed)
Medical screening examination/treatment/procedure(s) were performed by non-physician practitioner and as supervising physician I was immediately available for consultation/collaboration.   EKG Interpretation None        Ephraim Hamburger, MD 03/06/14 [phone]

## 2014-03-06 NOTE — ED Notes (Signed)
Asked pt to obtain urine specimen pt stated she voided when she first came back to her room and did not obtain specimen
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
## 2014-03-08 LAB — URINE CULTURE
Colony Count: NO GROWTH
Culture: NO GROWTH

## 2014-03-15 ENCOUNTER — Other Ambulatory Visit: Payer: Self-pay | Admitting: Internal Medicine

## 2014-04-17 ENCOUNTER — Emergency Department (HOSPITAL_COMMUNITY)
Admission: EM | Admit: 2014-04-17 | Discharge: 2014-04-17 | Disposition: A | Discharge status: Home / Self Care | Payer: Medicaid Other | Attending: Emergency Medicine | Admitting: Emergency Medicine

## 2014-04-17 ENCOUNTER — Encounter (HOSPITAL_COMMUNITY): Payer: Self-pay | Admitting: Emergency Medicine

## 2014-04-17 DIAGNOSIS — Z862 Personal history of diseases of the blood and blood-forming organs and certain disorders involving the immune mechanism: Secondary | ICD-10-CM | POA: Insufficient documentation

## 2014-04-17 DIAGNOSIS — Z79899 Other long term (current) drug therapy: Secondary | ICD-10-CM | POA: Insufficient documentation

## 2014-04-17 DIAGNOSIS — F319 Bipolar disorder, unspecified: Secondary | ICD-10-CM | POA: Insufficient documentation

## 2014-04-17 DIAGNOSIS — F411 Generalized anxiety disorder: Secondary | ICD-10-CM | POA: Diagnosis not present

## 2014-04-17 DIAGNOSIS — J441 Chronic obstructive pulmonary disease with (acute) exacerbation: Secondary | ICD-10-CM | POA: Insufficient documentation

## 2014-04-17 DIAGNOSIS — F17200 Nicotine dependence, unspecified, uncomplicated: Secondary | ICD-10-CM | POA: Diagnosis not present

## 2014-04-17 DIAGNOSIS — Z853 Personal history of malignant neoplasm of breast: Secondary | ICD-10-CM | POA: Diagnosis not present

## 2014-04-17 DIAGNOSIS — M129 Arthropathy, unspecified: Secondary | ICD-10-CM | POA: Diagnosis not present

## 2014-04-17 DIAGNOSIS — Z8639 Personal history of other endocrine, nutritional and metabolic disease: Secondary | ICD-10-CM | POA: Insufficient documentation

## 2014-04-17 DIAGNOSIS — J45901 Unspecified asthma with (acute) exacerbation: Principal | ICD-10-CM

## 2014-04-17 DIAGNOSIS — G40909 Epilepsy, unspecified, not intractable, without status epilepticus: Secondary | ICD-10-CM | POA: Diagnosis not present

## 2014-04-17 DIAGNOSIS — R0602 Shortness of breath: Secondary | ICD-10-CM | POA: Diagnosis present

## 2014-04-17 MED ORDER — IPRATROPIUM-ALBUTEROL 0.5-2.5 (3) MG/3ML IN SOLN
3.0000 mL | Freq: Once | RESPIRATORY_TRACT | Status: AC
  Administered 2014-04-17: 3 mL via RESPIRATORY_TRACT
  Filled 2014-04-17: qty 3

## 2014-04-17 MED ORDER — PREDNISONE 50 MG PO TABS
50.0000 mg | ORAL_TABLET | Freq: Once | ORAL | Status: AC
  Administered 2014-04-17: 50 mg via ORAL

## 2014-04-17 MED ORDER — PREDNISONE (PAK) 10 MG PO TABS: ORAL_TABLET | ORAL | Status: DC

## 2014-04-17 MED ORDER — PREDNISONE 50 MG PO TABS
50.0000 mg | ORAL_TABLET | Freq: Every day | ORAL | Status: DC
  Filled 2014-04-17: qty 1

## 2014-04-17 MED ORDER — ALPRAZOLAM 0.5 MG PO TABS
0.5000 mg | ORAL_TABLET | Freq: Once | ORAL | Status: AC
  Administered 2014-04-17: 0.5 mg via ORAL
  Filled 2014-04-17: qty 1

## 2014-04-17 NOTE — ED Notes (Signed)
Pt reports SOB that started today "around 1:30." Pt denies N/V/D or any other s/sx. Pt lung sounds diminished bilaterally, no wheezing noted. Pt is A&O and in NAD
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
## 2014-04-17 NOTE — ED Notes (Addendum)
Pt states hx of COPD.  States that she takes xanax for her COPD and she is out and needs more.  Pt c/o SOB x 1 hour.  Pt is in NAD and is breathing easily in triage and is 98% on RA.

## 2014-04-17 NOTE — Discharge Instructions (Signed)

## 2014-04-17 NOTE — ED Notes (Signed)
Per Dr. Tomi Bamberger, pt can eat. Pt given 2 sandwiches per request. Pt in NAD.

## 2014-06-06 ENCOUNTER — Other Ambulatory Visit: Payer: Self-pay | Admitting: Internal Medicine

## 2014-06-29 ENCOUNTER — Inpatient Hospital Stay (HOSPITAL_COMMUNITY)
Admission: EM | Admit: 2014-06-29 | Discharge: 2014-07-05 | DRG: 190 | Disposition: A | Discharge status: Home Health | Payer: Medicaid Other | Attending: Internal Medicine | Admitting: Internal Medicine

## 2014-06-29 ENCOUNTER — Encounter (HOSPITAL_COMMUNITY): Payer: Self-pay | Admitting: Emergency Medicine

## 2014-06-29 ENCOUNTER — Emergency Department (HOSPITAL_COMMUNITY): Payer: Medicaid Other

## 2014-06-29 DIAGNOSIS — D72829 Elevated white blood cell count, unspecified: Secondary | ICD-10-CM | POA: Diagnosis present

## 2014-06-29 DIAGNOSIS — Z8049 Family history of malignant neoplasm of other genital organs: Secondary | ICD-10-CM | POA: Diagnosis not present

## 2014-06-29 DIAGNOSIS — Z9981 Dependence on supplemental oxygen: Secondary | ICD-10-CM

## 2014-06-29 DIAGNOSIS — J449 Chronic obstructive pulmonary disease, unspecified: Secondary | ICD-10-CM | POA: Diagnosis present

## 2014-06-29 DIAGNOSIS — J9602 Acute respiratory failure with hypercapnia: Secondary | ICD-10-CM

## 2014-06-29 DIAGNOSIS — F1320 Sedative, hypnotic or anxiolytic dependence, uncomplicated: Secondary | ICD-10-CM | POA: Diagnosis present

## 2014-06-29 DIAGNOSIS — Z8249 Family history of ischemic heart disease and other diseases of the circulatory system: Secondary | ICD-10-CM | POA: Diagnosis not present

## 2014-06-29 DIAGNOSIS — F319 Bipolar disorder, unspecified: Secondary | ICD-10-CM | POA: Diagnosis present

## 2014-06-29 DIAGNOSIS — F17200 Nicotine dependence, unspecified, uncomplicated: Secondary | ICD-10-CM | POA: Diagnosis present

## 2014-06-29 DIAGNOSIS — M129 Arthropathy, unspecified: Secondary | ICD-10-CM | POA: Diagnosis present

## 2014-06-29 DIAGNOSIS — Z79899 Other long term (current) drug therapy: Secondary | ICD-10-CM | POA: Diagnosis not present

## 2014-06-29 DIAGNOSIS — G40909 Epilepsy, unspecified, not intractable, without status epilepticus: Secondary | ICD-10-CM | POA: Diagnosis present

## 2014-06-29 DIAGNOSIS — J9620 Acute and chronic respiratory failure, unspecified whether with hypoxia or hypercapnia: Secondary | ICD-10-CM | POA: Diagnosis present

## 2014-06-29 DIAGNOSIS — Z23 Encounter for immunization: Secondary | ICD-10-CM | POA: Diagnosis not present

## 2014-06-29 DIAGNOSIS — Z853 Personal history of malignant neoplasm of breast: Secondary | ICD-10-CM | POA: Diagnosis not present

## 2014-06-29 DIAGNOSIS — E119 Type 2 diabetes mellitus without complications: Secondary | ICD-10-CM | POA: Diagnosis present

## 2014-06-29 DIAGNOSIS — J45901 Unspecified asthma with (acute) exacerbation: Principal | ICD-10-CM

## 2014-06-29 DIAGNOSIS — E039 Hypothyroidism, unspecified: Secondary | ICD-10-CM | POA: Diagnosis present

## 2014-06-29 DIAGNOSIS — J069 Acute upper respiratory infection, unspecified: Secondary | ICD-10-CM | POA: Diagnosis present

## 2014-06-29 DIAGNOSIS — R0602 Shortness of breath: Secondary | ICD-10-CM | POA: Diagnosis present

## 2014-06-29 DIAGNOSIS — T380X5A Adverse effect of glucocorticoids and synthetic analogues, initial encounter: Secondary | ICD-10-CM | POA: Diagnosis present

## 2014-06-29 DIAGNOSIS — J441 Chronic obstructive pulmonary disease with (acute) exacerbation: Secondary | ICD-10-CM | POA: Diagnosis present

## 2014-06-29 DIAGNOSIS — F4310 Post-traumatic stress disorder, unspecified: Secondary | ICD-10-CM | POA: Diagnosis present

## 2014-06-29 DIAGNOSIS — F411 Generalized anxiety disorder: Secondary | ICD-10-CM | POA: Diagnosis present

## 2014-06-29 DIAGNOSIS — J9600 Acute respiratory failure, unspecified whether with hypoxia or hypercapnia: Secondary | ICD-10-CM | POA: Diagnosis present

## 2014-06-29 DIAGNOSIS — C50419 Malignant neoplasm of upper-outer quadrant of unspecified female breast: Secondary | ICD-10-CM | POA: Diagnosis present

## 2014-06-29 DIAGNOSIS — F17210 Nicotine dependence, cigarettes, uncomplicated: Secondary | ICD-10-CM | POA: Diagnosis present

## 2014-06-29 DIAGNOSIS — J9601 Acute respiratory failure with hypoxia: Secondary | ICD-10-CM

## 2014-06-29 DIAGNOSIS — Z803 Family history of malignant neoplasm of breast: Secondary | ICD-10-CM | POA: Diagnosis not present

## 2014-06-29 LAB — BASIC METABOLIC PANEL
Anion gap: 13 (ref 5–15)
BUN: 10 mg/dL (ref 6–23)
CHLORIDE: 104 meq/L (ref 96–112)
CO2: 23 meq/L (ref 19–32)
Calcium: 8.6 mg/dL (ref 8.4–10.5)
Creatinine, Ser: 0.64 mg/dL (ref 0.50–1.10)
GFR calc non Af Amer: >90 mL/min (ref >90)
Glucose, Bld: 106 mg/dL — ABNORMAL HIGH (ref 70–99)
POTASSIUM: 3.9 meq/L (ref 3.7–5.3)
Sodium: 140 mEq/L (ref 137–147)

## 2014-06-29 LAB — BLOOD GAS, ARTERIAL
Acid-base deficit: 0.4 mmol/L (ref 0.0–2.0)
Bicarbonate: 24.8 mEq/L — ABNORMAL HIGH (ref 20.0–24.0)
Drawn by: 331471
O2 SAT: 87 %
PATIENT TEMPERATURE: 98.6
TCO2: 21.8 mmol/L (ref 0–100)
pCO2 arterial: 44.7 mmHg (ref 35.0–45.0)
pH, Arterial: 7.363 (ref 7.350–7.450)
pO2, Arterial: 52.4 mmHg — ABNORMAL LOW (ref 80.0–100.0)

## 2014-06-29 LAB — I-STAT CHEM 8, ED
BUN: 11 mg/dL (ref 6–23)
Calcium, Ion: 1.09 mmol/L — ABNORMAL LOW (ref 1.12–1.23)
Chloride: 106 mEq/L (ref 96–112)
Creatinine, Ser: 0.7 mg/dL (ref 0.50–1.10)
GLUCOSE: 102 mg/dL — AB (ref 70–99)
HCT: 52 % — ABNORMAL HIGH (ref 36.0–46.0)
HEMOGLOBIN: 17.7 g/dL — AB (ref 12.0–15.0)
Potassium: 7.1 mEq/L (ref 3.7–5.3)
Sodium: 135 mEq/L — ABNORMAL LOW (ref 137–147)
TCO2: 28 mmol/L (ref 0–100)

## 2014-06-29 LAB — CBC WITH DIFFERENTIAL/PLATELET
BASOS PCT: 0 % (ref 0–1)
Basophils Absolute: 0 10*3/uL (ref 0.0–0.1)
EOS PCT: 1 % (ref 0–5)
Eosinophils Absolute: 0.1 10*3/uL (ref 0.0–0.7)
HEMATOCRIT: 45.4 % (ref 36.0–46.0)
Hemoglobin: 15.1 g/dL — ABNORMAL HIGH (ref 12.0–15.0)
Lymphocytes Relative: 14 % (ref 12–46)
Lymphs Abs: 1.6 10*3/uL (ref 0.7–4.0)
MCH: 27.4 pg (ref 26.0–34.0)
MCHC: 33.3 g/dL (ref 30.0–36.0)
MCV: 82.4 fL (ref 78.0–100.0)
MONO ABS: 0.8 10*3/uL (ref 0.1–1.0)
MONOS PCT: 7 % (ref 3–12)
Neutro Abs: 9.3 10*3/uL — ABNORMAL HIGH (ref 1.7–7.7)
Neutrophils Relative %: 78 % — ABNORMAL HIGH (ref 43–77)
Platelets: 290 10*3/uL (ref 150–400)
RBC: 5.51 MIL/uL — ABNORMAL HIGH (ref 3.87–5.11)
RDW: 15 % (ref 11.5–15.5)
WBC: 11.9 10*3/uL — ABNORMAL HIGH (ref 4.0–10.5)

## 2014-06-29 LAB — MRSA PCR SCREENING: MRSA BY PCR: NEGATIVE

## 2014-06-29 LAB — EXPECTORATED SPUTUM ASSESSMENT W GRAM STAIN, RFLX TO RESP C

## 2014-06-29 LAB — EXPECTORATED SPUTUM ASSESSMENT W REFEX TO RESP CULTURE

## 2014-06-29 LAB — I-STAT TROPONIN, ED: Troponin i, poc: 0 ng/mL (ref 0.00–0.08)

## 2014-06-29 MED ORDER — FLUOXETINE HCL 20 MG PO CAPS
40.0000 mg | ORAL_CAPSULE | Freq: Every day | ORAL | Status: DC
  Administered 2014-06-29 – 2014-07-05 (×7): 40 mg via ORAL
  Filled 2014-06-29 (×7): qty 2

## 2014-06-29 MED ORDER — ARIPIPRAZOLE 2 MG PO TABS
2.0000 mg | ORAL_TABLET | Freq: Every day | ORAL | Status: DC
  Administered 2014-06-29 – 2014-07-05 (×7): 2 mg via ORAL
  Filled 2014-06-29 (×7): qty 1

## 2014-06-29 MED ORDER — ALBUTEROL (5 MG/ML) CONTINUOUS INHALATION SOLN
10.0000 mg/h | INHALATION_SOLUTION | Freq: Once | RESPIRATORY_TRACT | Status: AC
Start: 2014-06-29 — End: 2014-06-29
  Administered 2014-06-29: 10 mg/h via RESPIRATORY_TRACT
  Filled 2014-06-29: qty 20

## 2014-06-29 MED ORDER — INFLUENZA VAC SPLIT QUAD 0.5 ML IM SUSY
0.5000 mL | PREFILLED_SYRINGE | INTRAMUSCULAR | Status: AC
Start: 2014-06-30 — End: 2014-06-30
  Administered 2014-06-30: 0.5 mL via INTRAMUSCULAR
  Filled 2014-06-29 (×2): qty 0.5

## 2014-06-29 MED ORDER — ALUM & MAG HYDROXIDE-SIMETH 200-200-20 MG/5ML PO SUSP: 30.0000 mL | Freq: Four times a day (QID) | ORAL | Status: DC | PRN

## 2014-06-29 MED ORDER — POLYETHYLENE GLYCOL 3350 17 G PO PACK: 17.0000 g | PACK | Freq: Every day | ORAL | Status: DC | PRN

## 2014-06-29 MED ORDER — ONDANSETRON HCL 4 MG/2ML IJ SOLN: 4.0000 mg | Freq: Four times a day (QID) | INTRAMUSCULAR | Status: DC | PRN

## 2014-06-29 MED ORDER — GUAIFENESIN-DM 100-10 MG/5ML PO SYRP
5.0000 mL | ORAL_SOLUTION | ORAL | Status: DC | PRN
  Administered 2014-06-30 – 2014-07-02 (×4): 5 mL via ORAL
  Filled 2014-06-29 (×5): qty 10

## 2014-06-29 MED ORDER — ALBUTEROL SULFATE (2.5 MG/3ML) 0.083% IN NEBU
2.5000 mg | INHALATION_SOLUTION | Freq: Four times a day (QID) | RESPIRATORY_TRACT | Status: AC | PRN
  Administered 2014-06-29: 2.5 mg via RESPIRATORY_TRACT
  Filled 2014-06-29: qty 3

## 2014-06-29 MED ORDER — HYDROCODONE-ACETAMINOPHEN 5-325 MG PO TABS
1.0000 | ORAL_TABLET | ORAL | Status: DC | PRN
  Administered 2014-06-29: 2 via ORAL
  Filled 2014-06-29: qty 2

## 2014-06-29 MED ORDER — SODIUM CHLORIDE 0.9 % IV BOLUS (SEPSIS)
1000.0000 mL | Freq: Once | INTRAVENOUS | Status: AC
  Administered 2014-06-29: 1000 mL via INTRAVENOUS

## 2014-06-29 MED ORDER — ALBUTEROL SULFATE (2.5 MG/3ML) 0.083% IN NEBU
2.5000 mg | INHALATION_SOLUTION | Freq: Four times a day (QID) | RESPIRATORY_TRACT | Status: DC
  Administered 2014-06-29 – 2014-06-30 (×3): 2.5 mg via RESPIRATORY_TRACT
  Filled 2014-06-29 (×4): qty 3

## 2014-06-29 MED ORDER — PREDNISONE 20 MG PO TABS
60.0000 mg | ORAL_TABLET | Freq: Once | ORAL | Status: AC
  Administered 2014-06-29: 60 mg via ORAL
  Filled 2014-06-29: qty 3

## 2014-06-29 MED ORDER — GUAIFENESIN-CODEINE 100-10 MG/5ML PO SOLN
10.0000 mL | Freq: Once | ORAL | Status: AC
  Administered 2014-06-29: 10 mL via ORAL
  Filled 2014-06-29: qty 10

## 2014-06-29 MED ORDER — CHLORHEXIDINE GLUCONATE 0.12 % MT SOLN
15.0000 mL | Freq: Two times a day (BID) | OROMUCOSAL | Status: DC
  Administered 2014-06-29 – 2014-07-02 (×6): 15 mL via OROMUCOSAL
  Filled 2014-06-29 (×9): qty 15

## 2014-06-29 MED ORDER — CETYLPYRIDINIUM CHLORIDE 0.05 % MT LIQD
7.0000 mL | Freq: Two times a day (BID) | OROMUCOSAL | Status: DC
  Administered 2014-06-30: 7 mL via OROMUCOSAL

## 2014-06-29 MED ORDER — METHYLPREDNISOLONE SODIUM SUCC 125 MG IJ SOLR
80.0000 mg | Freq: Three times a day (TID) | INTRAMUSCULAR | Status: DC
  Administered 2014-06-29 – 2014-07-03 (×12): 80 mg via INTRAVENOUS
  Filled 2014-06-29 (×12): qty 2

## 2014-06-29 MED ORDER — ONDANSETRON HCL 4 MG PO TABS: 4.0000 mg | ORAL_TABLET | Freq: Four times a day (QID) | ORAL | Status: DC | PRN

## 2014-06-29 MED ORDER — DIVALPROEX SODIUM 500 MG PO DR TAB
1000.0000 mg | DELAYED_RELEASE_TABLET | Freq: Two times a day (BID) | ORAL | Status: DC
  Administered 2014-06-29 – 2014-07-01 (×4): 1000 mg via ORAL
  Administered 2014-07-02 – 2014-07-03 (×2): 500 mg via ORAL
  Administered 2014-07-03: 1000 mg via ORAL
  Administered 2014-07-04: 500 mg via ORAL
  Filled 2014-06-29 (×14): qty 2

## 2014-06-29 MED ORDER — DICYCLOMINE HCL 20 MG PO TABS
20.0000 mg | ORAL_TABLET | Freq: Two times a day (BID) | ORAL | Status: DC | PRN
  Filled 2014-06-29: qty 1

## 2014-06-29 MED ORDER — MOMETASONE FURO-FORMOTEROL FUM 200-5 MCG/ACT IN AERO
2.0000 | INHALATION_SPRAY | Freq: Two times a day (BID) | RESPIRATORY_TRACT | Status: DC
  Administered 2014-06-29 – 2014-06-30 (×2): 2 via RESPIRATORY_TRACT
  Filled 2014-06-29: qty 8.8

## 2014-06-29 MED ORDER — ALPRAZOLAM 0.5 MG PO TABS
0.5000 mg | ORAL_TABLET | ORAL | Status: DC | PRN
  Administered 2014-06-29 – 2014-07-05 (×12): 0.5 mg via ORAL
  Filled 2014-06-29 (×12): qty 1

## 2014-06-29 MED ORDER — AZITHROMYCIN 250 MG PO TABS
500.0000 mg | ORAL_TABLET | Freq: Every day | ORAL | Status: DC
  Administered 2014-06-29 – 2014-06-30 (×2): 500 mg via ORAL
  Filled 2014-06-29 (×2): qty 2

## 2014-06-29 MED ORDER — ENOXAPARIN SODIUM 40 MG/0.4ML ~~LOC~~ SOLN
40.0000 mg | SUBCUTANEOUS | Status: DC
  Administered 2014-06-29 – 2014-07-04 (×6): 40 mg via SUBCUTANEOUS
  Filled 2014-06-29 (×7): qty 0.4

## 2014-06-29 MED ORDER — SODIUM CHLORIDE 0.9 % IJ SOLN
3.0000 mL | Freq: Two times a day (BID) | INTRAMUSCULAR | Status: DC
  Administered 2014-06-29 – 2014-07-04 (×11): 3 mL via INTRAVENOUS

## 2014-06-29 NOTE — Progress Notes (Signed)
UR completed 

## 2014-06-29 NOTE — ED Notes (Addendum)
Per EMS: Pt has hx of asthma and emphysema, pt has had 2 tx of atrovent/albuterol along with her inhalers, pt on 8 L of O2. Pt has wheezing in all fields along with rhochi.

## 2014-06-29 NOTE — ED Provider Notes (Signed)
Medical screening examination/treatment/procedure(s) were conducted as a shared visit with non-physician practitioner(s) and myself.  I personally evaluated the patient during the encounter.   EKG Interpretation   Date/Time:  Wednesday June 29 2014 10:05:49 EDT Ventricular Rate:  106 PR Interval:  140 QRS Duration: 74 QT Interval:  356 QTC Calculation: 473 R Axis:   88 Text Interpretation:  Sinus tachycardia RAE, consider biatrial enlargement  Anterior infarct, old No significant change since last tracing Confirmed  by Marilu Rylander,  DO, Exander Shaul ([id number]) on 06/29/2014 11:04:26 AM Also confirmed by  Samba Cumba,  DO, Edlin Ford ([id number])  on 06/29/2014 11:04:44 AM      Pt is a 53 y.o. female with history of COPD who presents emergency department with a COPD exacerbation. Short of breath since last night after smoking crack. She has had fevers or one-to-one, cough with yellow sputum production per her report. No lower extreme swelling or pain. No chest pain. She is in mild to moderate respiratory distress in the ED, speaking short sentences with diminished aeration at her bases. We'll give continuous albuterol, steroids, obtain chest x-ray and ABG.   Patient continues to desat with ambulation to the low 80s. Will admit for COPD exacerbation.    CRITICAL CARE Performed by: Nyra Jabs   Total critical care time: 35 minutes  Critical care time was exclusive of separately billable procedures and treating other patients.  Critical care was necessary to treat or prevent imminent or life-threatening deterioration.  Critical care was time spent personally by me on the following activities: development of treatment plan with patient and/or surrogate as well as nursing, discussions with consultants, evaluation of patient's response to treatment, examination of patient, obtaining history from patient or surrogate, ordering and performing treatments and interventions, ordering and review of laboratory  studies, ordering and review of radiographic studies, pulse oximetry and re-evaluation of patient's condition.     [place], DO 06/29/14 1312

## 2014-06-29 NOTE — ED Notes (Signed)
RT contacted to start neb tx 

## 2014-06-29 NOTE — ED Notes (Addendum)
MD at bedside.  hospitalist 

## 2014-06-29 NOTE — ED Notes (Signed)
Bed: PF29 Expected date:  Expected time:  Means of arrival:  Comments: SOB
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
## 2014-06-29 NOTE — Progress Notes (Signed)
Per PA in ED seeing room 11 [facility]) ok to hold off on BIPAP at this time. PT in no distress, vitals stable.
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
## 2014-06-30 DIAGNOSIS — J9600 Acute respiratory failure, unspecified whether with hypoxia or hypercapnia: Secondary | ICD-10-CM

## 2014-06-30 DIAGNOSIS — J441 Chronic obstructive pulmonary disease with (acute) exacerbation: Secondary | ICD-10-CM

## 2014-06-30 LAB — BLOOD GAS, ARTERIAL
Acid-base deficit: 1.8 mmol/L (ref 0.0–2.0)
Acid-base deficit: 0.4 mmol/L (ref 0.0–2.0)
BICARBONATE: 26.7 meq/L — AB (ref 20.0–24.0)
BICARBONATE: 26 meq/L — AB (ref 20.0–24.0)
DRAWN BY: 2577013
Drawn by: 307971
Expiratory PAP: 0
FIO2: 0.7 %
FIO2: 0.3 %
Inspiratory PAP: 0
LHR: 15 {breaths}/min
O2 SAT: 96.1 %
O2 Saturation: 99 %
PATIENT TEMPERATURE: 98.6
PEEP/CPAP: 6 cmH2O
PH ART: 7.246 — AB (ref 7.350–7.450)
PH ART: 7.321 — AB (ref 7.350–7.450)
PO2 ART: 82.9 mmHg (ref 80.0–100.0)
Patient temperature: 98.6
Pressure support: 8 cmH2O
TCO2: 24.2 mmol/L (ref 0–100)
TCO2: 23.2 mmol/L (ref 0–100)
pCO2 arterial: 63.7 mmHg (ref 35.0–45.0)
pCO2 arterial: 51.8 mmHg — ABNORMAL HIGH (ref 35.0–45.0)
pO2, Arterial: 262 mmHg — ABNORMAL HIGH (ref 80.0–100.0)

## 2014-06-30 LAB — BASIC METABOLIC PANEL
Anion gap: 11 (ref 5–15)
BUN: 16 mg/dL (ref 6–23)
CHLORIDE: 101 meq/L (ref 96–112)
CO2: 26 mEq/L (ref 19–32)
CREATININE: 0.6 mg/dL (ref 0.50–1.10)
Calcium: 9.5 mg/dL (ref 8.4–10.5)
GFR calc non Af Amer: >90 mL/min (ref >90)
GLUCOSE: 179 mg/dL — AB (ref 70–99)
POTASSIUM: 4.8 meq/L (ref 3.7–5.3)
Sodium: 138 mEq/L (ref 137–147)

## 2014-06-30 LAB — CBC
HCT: 43.5 % (ref 36.0–46.0)
Hemoglobin: 14.1 g/dL (ref 12.0–15.0)
MCH: 27.2 pg (ref 26.0–34.0)
MCHC: 32.4 g/dL (ref 30.0–36.0)
MCV: 84 fL (ref 78.0–100.0)
Platelets: 287 10*3/uL (ref 150–400)
RBC: 5.18 MIL/uL — AB (ref 3.87–5.11)
RDW: 15.1 % (ref 11.5–15.5)
WBC: 7.4 10*3/uL (ref 4.0–10.5)

## 2014-06-30 MED ORDER — DEXTROSE 5 % IV SOLN
500.0000 mg | INTRAVENOUS | Status: DC
  Administered 2014-07-01 – 2014-07-02 (×2): 500 mg via INTRAVENOUS
  Filled 2014-06-30 (×2): qty 500

## 2014-06-30 MED ORDER — IPRATROPIUM BROMIDE 0.02 % IN SOLN
0.5000 mg | Freq: Four times a day (QID) | RESPIRATORY_TRACT | Status: DC
Start: 2014-06-30 — End: 2014-07-04
  Administered 2014-06-30 – 2014-07-04 (×17): 0.5 mg via RESPIRATORY_TRACT
  Filled 2014-06-30 (×18): qty 2.5

## 2014-06-30 MED ORDER — ALBUTEROL SULFATE (2.5 MG/3ML) 0.083% IN NEBU
2.5000 mg | INHALATION_SOLUTION | RESPIRATORY_TRACT | Status: DC
  Filled 2014-06-30: qty 3

## 2014-06-30 MED ORDER — ALBUTEROL SULFATE (2.5 MG/3ML) 0.083% IN NEBU
2.5000 mg | INHALATION_SOLUTION | RESPIRATORY_TRACT | Status: DC
  Administered 2014-06-30 – 2014-07-04 (×28): 2.5 mg via RESPIRATORY_TRACT
  Filled 2014-06-30 (×31): qty 3

## 2014-06-30 MED ORDER — ALBUTEROL SULFATE (2.5 MG/3ML) 0.083% IN NEBU
INHALATION_SOLUTION | RESPIRATORY_TRACT | Status: AC
  Administered 2014-06-30: 2.5 mg via RESPIRATORY_TRACT
  Filled 2014-06-30: qty 3

## 2014-06-30 MED ORDER — ALBUTEROL SULFATE (2.5 MG/3ML) 0.083% IN NEBU
2.5000 mg | INHALATION_SOLUTION | RESPIRATORY_TRACT | Status: DC
Start: 2014-06-30 — End: 2014-06-30
  Administered 2014-06-30: 2.5 mg via RESPIRATORY_TRACT
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
## 2014-06-30 NOTE — Progress Notes (Signed)
PT requested to be removed from BiPAP at approximately 1230. RT placed PT on 2 lpm [place] (sp02 96%). RN aware.

## 2014-06-30 NOTE — Progress Notes (Signed)
Patient Demographics  Courtney Terry, is a 53 y.o. female, DOB - April 16, 1961, [id number]  Admit date - 06/29/2014   Admitting Physician Thurnell Lose, MD  Outpatient Primary MD for the patient is Dawayne Cirri, MD  LOS - 1   Chief Complaint  Patient presents with  . Shortness of Breath        Subjective:   Courtney Terry today has, No headache, No chest pain, No abdominal pain - No Nausea, No new weakness tingling or numbness, positive productive Cough - SOB.    Assessment & Plan    1. Acute on chronic respiratory failure due to COPD exacerbation and URI. She did not improve as expected, ABGs were stable but placed on BiPAP, to remain in step down bed, pending sputum Gram stain culture, continue on oral azithromycin along with scheduled IV steroids, scheduled and when necessary nebulizer treatments. Pulmonary consulted, if worse may need intubation.    2. History of smoking and cocaine use last 2 days before admission. Counseled to quit. Avoid beta blocker. Also has reactive airway disease.     3. History of seizures. Continue Depakote.    4. Depression anxiety continue Abilify, Prozac and Xanax.     5. History of right-sided breast cancer status post lumpectomy. No acute issues outpatient followup.     Code Status: Full  Family Communication: None present  Disposition Plan:   stepdown   Procedures chest x-ray unremarkable  Consults  pulmonary   Medications  Scheduled Meds: . albuterol  2.5 mg Nebulization Q3H  . antiseptic oral rinse  7 mL Mouth Rinse q12n4p  . ARIPiprazole  2 mg Oral Daily  . azithromycin  500 mg Oral Daily  . chlorhexidine  15 mL Mouth Rinse BID  . divalproex  1,000 mg Oral BID  . enoxaparin (LOVENOX) injection  40 mg Subcutaneous Q24H    . FLUoxetine  40 mg Oral Daily  . Influenza vac split quadrivalent PF  0.5 mL Intramuscular Tomorrow-1000  . ipratropium  0.5 mg Nebulization Q6H WA  . methylPREDNISolone (SOLU-MEDROL) injection  80 mg Intravenous 3 times per day  . sodium chloride  3 mL Intravenous Q12H   Continuous Infusions:  PRN Meds:.ALPRAZolam, alum & mag hydroxide-simeth, dicyclomine, guaiFENesin-dextromethorphan, HYDROcodone-acetaminophen, ondansetron (ZOFRAN) IV, ondansetron, polyethylene glycol  DVT Prophylaxis  Lovenox    Lab Results  Component Value Date   PLT 287 06/30/2014    Antibiotics     Anti-infectives   Start     Dose/Rate Route Frequency Ordered Stop   06/29/14 1615  azithromycin (ZITHROMAX) tablet 500 mg     500 mg Oral Daily 06/29/14 1607            Objective:   Filed Vitals:   06/30/14 0500 06/30/14 0600 06/30/14 0700 06/30/14 0747  BP: 138/86 132/76 155/90   Pulse: 81 88 84   Temp:   97.6 F (36.4 C)   TempSrc:   Axillary   Resp: 11 9 13    Height:      Weight:      SpO2: 98% 100% 99% 99%    Wt Readings from Last 3 Encounters:  06/30/14 63.4 kg (139 lb 12.4 oz)  10/18/13 66.679 kg (147 lb)  08/26/13 65.953 kg (  145 lb 6.4 oz)     Intake/Output Summary (Last 24 hours) at 06/30/14 1003 Last data filed at 06/30/14 0900  Gross per 24 hour  Intake    243 ml  Output    200 ml  Net     43 ml     Physical Exam  Awake Alert, Oriented X 3, No new F.N deficits, Normal affect [place].AT,PERRAL Supple Neck,No JVD, No cervical lymphadenopathy appriciated.  Symmetrical Chest wall movement, normal bilateral air movement with expiratory wheezing RRR,No Gallops,Rubs or new Murmurs, No Parasternal Heave +ve B.Sounds, Abd Soft, No tenderness, No organomegaly appriciated, No rebound - guarding or rigidity. No Cyanosis, Clubbing or edema, No new Rash or bruise      Data Review   Micro Results Recent Results (from the past 240 hour(s))  MRSA PCR SCREENING     Status: None    Collection Time    06/29/14  5:56 PM      Result Value Ref Range Status   MRSA by PCR NEGATIVE  NEGATIVE Final   Comment:            The GeneXpert MRSA Assay (FDA     approved for NASAL specimens     only), is one component of a     comprehensive MRSA colonization     surveillance program. It is not     intended to diagnose MRSA     infection nor to guide or     monitor treatment for     MRSA infections.  CULTURE, EXPECTORATED SPUTUM-ASSESSMENT     Status: None   Collection Time    06/29/14  6:34 PM      Result Value Ref Range Status   Specimen Description SPUTUM   Final   Special Requests NONE   Final   Sputum evaluation     Final   Value: THIS SPECIMEN IS ACCEPTABLE. RESPIRATORY CULTURE REPORT TO FOLLOW.   Report Status 06/29/2014 FINAL   Final  CULTURE, RESPIRATORY (NON-EXPECTORATED)     Status: None   Collection Time    06/29/14  6:34 PM      Result Value Ref Range Status   Specimen Description SPUTUM   Final   Special Requests NONE   Final   Gram Stain     Final   Value: FEW WBC PRESENT,BOTH PMN AND MONONUCLEAR     RARE SQUAMOUS EPITHELIAL CELLS PRESENT     RARE GRAM POSITIVE COCCI     IN PAIRS IN CLUSTERS RARE GRAM NEGATIVE RODS     Performed at [facility]   Culture     Final   Value: Culture reincubated for better growth     Performed at [facility]   Report Status PENDING   Incomplete    Radiology Reports Dg Chest Portable 1 View  06/29/2014   CLINICAL DATA:  Shortness of breath and chest pain  EXAM: PORTABLE CHEST - 1 VIEW  COMPARISON:  Mar 06, 2014  FINDINGS: There is underlying emphysematous change. There is bibasilar interstitial prominence which in part is felt to be due to redistribution of blood flow to viable segments of lung. There is no consolidation. Heart size is normal. Pulmonary vascularity reflects underlying emphysema. Postoperative change in the right breast and axillary regions are stable. No apparent adenopathy. No bone lesions.  Nipple shadow noted on left.  IMPRESSION: Underlying emphysematous change. No frank edema or consolidation. Postoperative change on right.   Electronically Signed   By: Gwyndolyn Saxon  Jasmine December M.D.   On: 06/29/2014 11:11     CBC  Recent Labs Lab 06/29/14 1040 06/29/14 1046 06/30/14 0321  WBC 11.9*  --  7.4  HGB 15.1* 17.7* 14.1  HCT 45.4 52.0* 43.5  PLT 290  --  287  MCV 82.4  --  84.0  MCH 27.4  --  27.2  MCHC 33.3  --  32.4  RDW 15.0  --  15.1  LYMPHSABS 1.6  --   --   MONOABS 0.8  --   --   EOSABS 0.1  --   --   BASOSABS 0.0  --   --     Chemistries   Recent Labs Lab 06/29/14 1046 06/29/14 1128 06/30/14 0321  NA 135* 140 138  K 7.1* 3.9 4.8  CL 106 104 101  CO2  --  23 26  GLUCOSE 102* 106* 179*  BUN 11 10 16   CREATININE 0.70 0.64 0.60  CALCIUM  --  8.6 9.5   ------------------------------------------------------------------------------------------------------------------ estimated creatinine clearance is 82.3 ml/min (by C-G formula based on Cr of 0.6). ------------------------------------------------------------------------------------------------------------------ No results found for this basename: HGBA1C,  in the last 72 hours ------------------------------------------------------------------------------------------------------------------ No results found for this basename: CHOL, HDL, LDLCALC, TRIG, CHOLHDL, LDLDIRECT,  in the last 72 hours ------------------------------------------------------------------------------------------------------------------ No results found for this basename: TSH, T4TOTAL, FREET3, T3FREE, THYROIDAB,  in the last 72 hours ------------------------------------------------------------------------------------------------------------------ No results found for this basename: VITAMINB12, FOLATE, FERRITIN, TIBC, IRON, RETICCTPCT,  in the last 72 hours  Coagulation profile No results found for this basename: INR, PROTIME,  in the last 168  hours  No results found for this basename: DDIMER,  in the last 72 hours  Cardiac Enzymes No results found for this basename: CK, CKMB, TROPONINI, MYOGLOBIN,  in the last 168 hours ------------------------------------------------------------------------------------------------------------------ No components found with this basename: POCBNP,      Time Spent in minutes   35   Myrla Malanowski K M.D on 06/30/2014 at 10:03 AM  Between 7am to 7pm - Pager - [phone]  After 7pm go to [email or web address] - password [email or web address]  And look for the night coverage person covering for me after hours  [facility] Office  [phone]   **Disclaimer: This note may have been dictated with voice recognition software. Similar sounding words can inadvertently be transcribed and this note may contain transcription errors which may not have been corrected upon publication of note.**

## 2014-06-30 NOTE — Progress Notes (Signed)
PT requested to be removed from BiPAP- RT placed on 2 lpm [place]- RN aware.

## 2014-07-01 ENCOUNTER — Inpatient Hospital Stay (HOSPITAL_COMMUNITY): Payer: Medicaid Other

## 2014-07-01 LAB — COMPREHENSIVE METABOLIC PANEL
ALK PHOS: 99 U/L (ref 39–117)
ALT: 16 U/L (ref 0–35)
ANION GAP: 12 (ref 5–15)
AST: 20 U/L (ref 0–37)
Albumin: 3.2 g/dL — ABNORMAL LOW (ref 3.5–5.2)
BUN: 14 mg/dL (ref 6–23)
CO2: 29 meq/L (ref 19–32)
Calcium: 9.4 mg/dL (ref 8.4–10.5)
Chloride: 99 mEq/L (ref 96–112)
Creatinine, Ser: 0.65 mg/dL (ref 0.50–1.10)
GFR calc Af Amer: >90 mL/min (ref >90)
GLUCOSE: 147 mg/dL — AB (ref 70–99)
POTASSIUM: 4.4 meq/L (ref 3.7–5.3)
SODIUM: 140 meq/L (ref 137–147)
Total Protein: 7.1 g/dL (ref 6.0–8.3)

## 2014-07-01 LAB — BLOOD GAS, ARTERIAL
ACID-BASE EXCESS: 3.3 mmol/L — AB (ref 0.0–2.0)
ALLENS TEST (PASS/FAIL): POSITIVE — AB
BICARBONATE: 30.2 meq/L — AB (ref 20.0–24.0)
Drawn by: 422461
O2 Content: 2 L/min
O2 SAT: 97.8 %
Patient temperature: 98.7
TCO2: 27.2 mmol/L (ref 0–100)
pCO2 arterial: 59.3 mmHg (ref 35.0–45.0)
pH, Arterial: 7.328 — ABNORMAL LOW (ref 7.350–7.450)
pO2, Arterial: 109 mmHg — ABNORMAL HIGH (ref 80.0–100.0)

## 2014-07-01 LAB — CBC
HEMATOCRIT: 42.2 % (ref 36.0–46.0)
HEMOGLOBIN: 13.4 g/dL (ref 12.0–15.0)
MCH: 27.3 pg (ref 26.0–34.0)
MCHC: 31.8 g/dL (ref 30.0–36.0)
MCV: 85.9 fL (ref 78.0–100.0)
Platelets: 254 10*3/uL (ref 150–400)
RBC: 4.91 MIL/uL (ref 3.87–5.11)
RDW: 15.1 % (ref 11.5–15.5)
WBC: 17.1 10*3/uL — ABNORMAL HIGH (ref 4.0–10.5)

## 2014-07-01 NOTE — Progress Notes (Signed)
Pt requested to be placed on BiPAP to help her "catch her breath," via servo-i and settings of PC-8, RR-15, PEEP-6, and FiO2 of 30%. Pt stable and tolerating well.

## 2014-07-01 NOTE — Progress Notes (Signed)
PULMONARY / CRITICAL CARE MEDICINE   Name: Courtney Terry MRN: [id number] DOB: 04-19-1961    ADMISSION DATE:  06/29/2014 CONSULTATION DATE:  9/24  REFERRING MD :  Candiss Norse   CHIEF COMPLAINT:  Acute respiratory failure    HISTORY OF PRESENT ILLNESS:   53 y.o. female, with history of COPD Gold stage IV, intermittent use of home oxygen, asthma, smoking which is ongoing counseled to quit, uses crack cocaine last used 2 days ago, breast cancer status post right-sided lumpectomy, hypothyroidism, epilepsy who presented to the hospital 9/23 with two-day history of productive cough, shortness of breath and wheezing.+ subjective fever chills, no chest pain. After further questioning cough and wheezing had been progressive for about a 2 week time frame. This was following an exposure from a friend w/ URI symptoms. Symptoms significantly worse the 2 day prior to admit. Admitted to the IM service. Treated w/ empiric abx, systemic steroids and scheduled BDs. PCCM asked to see on 9/24 for progressive hypercarbic respiratory failure.  Admits to using cocaine  SUBJECTIVE:  Mild improved dyspnea  Off bipap by 3 pm yesterday Afebrile Cough +   VITAL SIGNS: Temp:  [97.6 F (36.4 C)-98.7 F (37.1 C)] 98 F (36.7 C) (09/25 0400) Pulse Rate:  [78-112] 102 (09/25 0845) Resp:  [10-34] 12 (09/25 0845) BP: (109-141)/(55-89) 121/76 mmHg (09/25 0845) SpO2:  [92 %-99 %] 96 % (09/25 0845) FiO2 (%):  [30 %] 30 % (09/24 1439) HEMODYNAMICS:   VENTILATOR SETTINGS: Vent Mode:  [-] Other (Comment) FiO2 (%):  [30 %] 30 % Set Rate:  [15 bmp] 15 bmp PEEP:  [6 cmH20] 6 cmH20 INTAKE / OUTPUT:  Intake/Output Summary (Last 24 hours) at 07/01/14 0854 Last data filed at 07/01/14 0400  Gross per 24 hour  Intake    120 ml  Output    451 ml  Net   -331 ml    PHYSICAL EXAMINATION: General:  Chronically ill appearing, frail AAF sitting w decreased accessory muscle use  Neuro:  Awake, alert, confused  HEENT:  + JVD   Cardiovascular:  rrr Lungs:  Prolonged exp wheeze, scattered rhonchi ,+ accessory muscle use  Abdomen:  Soft, non tender + bowel sounds  Musculoskeletal:  Intact  Skin:  Intact   LABS:  CBC  Recent Labs Lab 06/29/14 1040 06/29/14 1046 06/30/14 0321 07/01/14 0345  WBC 11.9*  --  7.4 17.1*  HGB 15.1* 17.7* 14.1 13.4  HCT 45.4 52.0* 43.5 42.2  PLT 290  --  287 254   Coag's No results found for this basename: APTT, INR,  in the last 168 hours BMET  Recent Labs Lab 06/29/14 1128 06/30/14 0321 07/01/14 0345  NA 140 138 140  K 3.9 4.8 4.4  CL 104 101 99  CO2 23 26 29   BUN 10 16 14   CREATININE 0.64 0.60 0.65  GLUCOSE 106* 179* 147*   Electrolytes  Recent Labs Lab 06/29/14 1128 06/30/14 0321 07/01/14 0345  CALCIUM 8.6 9.5 9.4   Sepsis Markers No results found for this basename: LATICACIDVEN, PROCALCITON, O2SATVEN,  in the last 168 hours ABG  Recent Labs Lab 06/30/14 0822 06/30/14 1033 07/01/14 0353  PHART 7.246* 7.321* 7.328*  PCO2ART 63.7* 51.8* 59.3*  PO2ART 262.0* 82.9 109.0*   Liver Enzymes  Recent Labs Lab 07/01/14 0345  AST 20  ALT 16  ALKPHOS 99  BILITOT <0.2*  ALBUMIN 3.2*   Cardiac Enzymes No results found for this basename: TROPONINI, PROBNP,  in the last 168 hours Glucose No  results found for this basename: GLUCAP,  in the last 168 hours  Imaging No results found.   ASSESSMENT / PLAN:  PULMONARY OETT A: Acute hypercarbic respiratory failure in setting of AECOPD Tobacco abuse.   P:   NIPPV prn Scheduled BDs Solumedrol 80 q 8 ABG only if worse   INFECTIOUS A:   Acute bronchitis/ AECOPD  P:  Sputum: rare GPC pairs/clusters; rare GNR >> oral flora Abx: azithro, start date: 9/23, day 1/X  ENDOCRINE A:   DM   P:   ssi   NEUROLOGIC A:   Anxiety.  Chronic benzo dependency  H/o crack cocaine  P:   Supportive care PRN xanax for anxiety  Goals of care discussed w/ pt, She requests aggressive care and full  code status.   TODAY'S SUMMARY: Some improvement in bronchospasm, continue  w/ scheduled BDs, systemic steroids, empiric abx and NIPPV prn  I have personally obtained a history, examined the patient, evaluated laboratory and imaging results, formulated the assessment and plan and placed orders.  Kara Mead MD. Shade Flood. [facility] Pager [phone] If no response call [phone]    07/01/2014, 8:54 AM

## 2014-07-01 NOTE — Progress Notes (Signed)
Patient Demographics  Courtney Terry, is a 53 y.o. female, DOB - [birth year], EQA:[id number]  Admit date - 06/29/2014   Admitting Physician Thurnell Lose, MD  Outpatient Primary MD for the patient is Dawayne Cirri, MD  LOS - 2   Chief Complaint  Patient presents with  . Shortness of Breath        Subjective:   Courtney Terry today has, No headache, No chest pain, No abdominal pain - No Nausea, No new weakness tingling or numbness, improved Cough - SOB.    Assessment & Plan    1. Acute on chronic respiratory failure due to COPD exacerbation and URI. Much improved after BiPAP on 06/30/2014,  pending sputum Gram stain culture, continue on azithromycin along with scheduled IV steroids, scheduled and when necessary nebulizer treatments. Pulmonary following. May need BiPAP when necessary today as well however overall much better, will increase activity and monitor one more day here.    2. History of smoking and cocaine use last 2 days before admission. Counseled to quit. Avoid beta blocker. Also has reactive airway disease.     3. History of seizures. Continue Depakote.     4. Depression anxiety continue Abilify, Prozac and Xanax.     5. History of right-sided breast cancer status post lumpectomy. No acute issues outpatient followup.    6. Leukocytosis likely due to steroids. Afebrile, check another 2 view chest x-ray and monitor.      Code Status: Full  Family Communication: None present  Disposition Plan:   stepdown   Procedures chest x-ray unremarkable  Consults  pulmonary   Medications  Scheduled Meds: . albuterol  2.5 mg Nebulization Q3H  . antiseptic oral rinse  7 mL Mouth Rinse q12n4p  . ARIPiprazole  2 mg Oral Daily  . azithromycin  500 mg Intravenous  Q24H  . chlorhexidine  15 mL Mouth Rinse BID  . divalproex  1,000 mg Oral BID  . enoxaparin (LOVENOX) injection  40 mg Subcutaneous Q24H  . FLUoxetine  40 mg Oral Daily  . ipratropium  0.5 mg Nebulization Q6H WA  . methylPREDNISolone (SOLU-MEDROL) injection  80 mg Intravenous 3 times per day  . sodium chloride  3 mL Intravenous Q12H   Continuous Infusions:  PRN Meds:.ALPRAZolam, alum & mag hydroxide-simeth, dicyclomine, guaiFENesin-dextromethorphan, HYDROcodone-acetaminophen, ondansetron (ZOFRAN) IV, ondansetron, polyethylene glycol  DVT Prophylaxis  Lovenox    Lab Results  Component Value Date   PLT 254 07/01/2014    Antibiotics     Anti-infectives   Start     Dose/Rate Route Frequency Ordered Stop   07/01/14 1000  azithromycin (ZITHROMAX) 500 mg in dextrose 5 % 250 mL IVPB     500 mg 250 mL/hr over 60 Minutes Intravenous Every 24 hours 06/30/14 1007     06/29/14 1615  azithromycin (ZITHROMAX) tablet 500 mg  Status:  Discontinued     500 mg Oral Daily 06/29/14 1607 06/30/14 1007          Objective:   Filed Vitals:   07/01/14 0528 07/01/14 0600 07/01/14 0700 07/01/14 0800  BP:  123/69 110/59 121/76  Pulse:  101 86 78  Temp:      TempSrc:      Resp:  12 10 10  Height:      Weight:      SpO2: 97% 98% 96% 97%    Wt Readings from Last 3 Encounters:  06/30/14 63.4 kg (139 lb 12.4 oz)  10/18/13 66.679 kg (147 lb)  08/26/13 65.953 kg (145 lb 6.4 oz)     Intake/Output Summary (Last 24 hours) at 07/01/14 0824 Last data filed at 07/01/14 0400  Gross per 24 hour  Intake    120 ml  Output    451 ml  Net   -331 ml     Physical Exam  Awake Alert, Oriented X 3, No new F.N deficits, Normal affect [place].AT,PERRAL Supple Neck,No JVD, No cervical lymphadenopathy appriciated.  Symmetrical Chest wall movement, normal bilateral air movement with expiratory wheezing RRR,No Gallops,Rubs or new Murmurs, No Parasternal Heave +ve B.Sounds, Abd Soft, No tenderness, No  organomegaly appriciated, No rebound - guarding or rigidity. No Cyanosis, Clubbing or edema, No new Rash or bruise      Data Review   Micro Results Recent Results (from the past 240 hour(s))  MRSA PCR SCREENING     Status: None   Collection Time    06/29/14  5:56 PM      Result Value Ref Range Status   MRSA by PCR NEGATIVE  NEGATIVE Final   Comment:            The GeneXpert MRSA Assay (FDA     approved for NASAL specimens     only), is one component of a     comprehensive MRSA colonization     surveillance program. It is not     intended to diagnose MRSA     infection nor to guide or     monitor treatment for     MRSA infections.  CULTURE, EXPECTORATED SPUTUM-ASSESSMENT     Status: None   Collection Time    06/29/14  6:34 PM      Result Value Ref Range Status   Specimen Description SPUTUM   Final   Special Requests NONE   Final   Sputum evaluation     Final   Value: THIS SPECIMEN IS ACCEPTABLE. RESPIRATORY CULTURE REPORT TO FOLLOW.   Report Status 06/29/2014 FINAL   Final  CULTURE, RESPIRATORY (NON-EXPECTORATED)     Status: None   Collection Time    06/29/14  6:34 PM      Result Value Ref Range Status   Specimen Description SPUTUM   Final   Special Requests NONE   Final   Gram Stain     Final   Value: FEW WBC PRESENT,BOTH PMN AND MONONUCLEAR     RARE SQUAMOUS EPITHELIAL CELLS PRESENT     RARE GRAM POSITIVE COCCI     IN PAIRS IN CLUSTERS RARE GRAM NEGATIVE RODS     Performed at [facility]   Culture     Final   Value: Culture reincubated for better growth     Performed at [facility]   Report Status PENDING   Incomplete    Radiology Reports Dg Chest Portable 1 View  06/29/2014   CLINICAL DATA:  Shortness of breath and chest pain  EXAM: PORTABLE CHEST - 1 VIEW  COMPARISON:  Mar 06, 2014  FINDINGS: There is underlying emphysematous change. There is bibasilar interstitial prominence which in part is felt to be due to redistribution of blood flow to  viable segments of lung. There is no consolidation. Heart size is normal. Pulmonary vascularity reflects underlying emphysema. Postoperative change in  the right breast and axillary regions are stable. No apparent adenopathy. No bone lesions. Nipple shadow noted on left.  IMPRESSION: Underlying emphysematous change. No frank edema or consolidation. Postoperative change on right.   Electronically Signed   By: Lowella Grip M.D.   On: 06/29/2014 11:11     CBC  Recent Labs Lab 06/29/14 1040 06/29/14 1046 06/30/14 0321 07/01/14 0345  WBC 11.9*  --  7.4 17.1*  HGB 15.1* 17.7* 14.1 13.4  HCT 45.4 52.0* 43.5 42.2  PLT 290  --  287 254  MCV 82.4  --  84.0 85.9  MCH 27.4  --  27.2 27.3  MCHC 33.3  --  32.4 31.8  RDW 15.0  --  15.1 15.1  LYMPHSABS 1.6  --   --   --   MONOABS 0.8  --   --   --   EOSABS 0.1  --   --   --   BASOSABS 0.0  --   --   --     Chemistries   Recent Labs Lab 06/29/14 1046 06/29/14 1128 06/30/14 0321 07/01/14 0345  NA 135* 140 138 140  K 7.1* 3.9 4.8 4.4  CL 106 104 101 99  CO2  --  23 26 29   GLUCOSE 102* 106* 179* 147*  BUN 11 10 16 14   CREATININE 0.70 0.64 0.60 0.65  CALCIUM  --  8.6 9.5 9.4  AST  --   --   --  20  ALT  --   --   --  16  ALKPHOS  --   --   --  99  BILITOT  --   --   --  <0.2*   ------------------------------------------------------------------------------------------------------------------ estimated creatinine clearance is 82.3 ml/min (by C-G formula based on Cr of 0.65). ------------------------------------------------------------------------------------------------------------------ No results found for this basename: HGBA1C,  in the last 72 hours ------------------------------------------------------------------------------------------------------------------ No results found for this basename: CHOL, HDL, LDLCALC, TRIG, CHOLHDL, LDLDIRECT,  in the last 72  hours ------------------------------------------------------------------------------------------------------------------ No results found for this basename: TSH, T4TOTAL, FREET3, T3FREE, THYROIDAB,  in the last 72 hours ------------------------------------------------------------------------------------------------------------------ No results found for this basename: VITAMINB12, FOLATE, FERRITIN, TIBC, IRON, RETICCTPCT,  in the last 72 hours  Coagulation profile No results found for this basename: INR, PROTIME,  in the last 168 hours  No results found for this basename: DDIMER,  in the last 72 hours  Cardiac Enzymes No results found for this basename: CK, CKMB, TROPONINI, MYOGLOBIN,  in the last 168 hours ------------------------------------------------------------------------------------------------------------------ No components found with this basename: POCBNP,      Time Spent in minutes   35   Adiel Erney K M.D on 07/01/2014 at 8:24 AM  Between 7am to 7pm - Pager - [phone]  After 7pm go to [email or web address] - password [email or web address]  And look for the night coverage person covering for me after hours  [facility] Office  [phone]   **Disclaimer: This note may have been dictated with voice recognition software. Similar sounding words can inadvertently be transcribed and this note may contain transcription errors which may not have been corrected upon publication of note.**

## 2014-07-01 NOTE — Progress Notes (Signed)
Informed by nurse that Pt had taken herself off of BiPAP and "did not want it anymore."

## 2014-07-01 NOTE — Progress Notes (Signed)
Pt had been refusing to get OOB and to the [facility], finally able to persuade pt that it was time for her to get up and into the chair. Pt was a standby assist the [facility]. Will continue to monitor.

## 2014-07-02 DIAGNOSIS — J449 Chronic obstructive pulmonary disease, unspecified: Secondary | ICD-10-CM

## 2014-07-02 DIAGNOSIS — J069 Acute upper respiratory infection, unspecified: Secondary | ICD-10-CM

## 2014-07-02 LAB — CBC
HEMATOCRIT: 42 % (ref 36.0–46.0)
HEMOGLOBIN: 13.4 g/dL (ref 12.0–15.0)
MCH: 27.1 pg (ref 26.0–34.0)
MCHC: 31.9 g/dL (ref 30.0–36.0)
MCV: 84.8 fL (ref 78.0–100.0)
PLATELETS: 285 10*3/uL (ref 150–400)
RBC: 4.95 MIL/uL (ref 3.87–5.11)
RDW: 15.4 % (ref 11.5–15.5)
WBC: 12.7 10*3/uL — ABNORMAL HIGH (ref 4.0–10.5)

## 2014-07-02 LAB — BASIC METABOLIC PANEL
ANION GAP: 9 (ref 5–15)
BUN: 20 mg/dL (ref 6–23)
CALCIUM: 9.4 mg/dL (ref 8.4–10.5)
CHLORIDE: 102 meq/L (ref 96–112)
CO2: 31 meq/L (ref 19–32)
Creatinine, Ser: 0.65 mg/dL (ref 0.50–1.10)
GFR calc non Af Amer: >90 mL/min (ref >90)
Glucose, Bld: 128 mg/dL — ABNORMAL HIGH (ref 70–99)
Potassium: 5 mEq/L (ref 3.7–5.3)
SODIUM: 142 meq/L (ref 137–147)

## 2014-07-02 LAB — CULTURE, RESPIRATORY W GRAM STAIN: Culture: NORMAL

## 2014-07-02 LAB — CULTURE, RESPIRATORY

## 2014-07-02 MED ORDER — POLYETHYLENE GLYCOL 3350 17 G PO PACK
17.0000 g | PACK | Freq: Two times a day (BID) | ORAL | Status: DC
  Administered 2014-07-02 – 2014-07-03 (×2): 17 g via ORAL
  Filled 2014-07-02 (×7): qty 1

## 2014-07-02 MED ORDER — DOCUSATE SODIUM 100 MG PO CAPS
100.0000 mg | ORAL_CAPSULE | Freq: Two times a day (BID) | ORAL | Status: DC
Start: 2014-07-02 — End: 2014-07-05
  Administered 2014-07-02 – 2014-07-04 (×3): 100 mg via ORAL
  Filled 2014-07-02 (×7): qty 1

## 2014-07-02 NOTE — Progress Notes (Signed)
Patient placed back on 4 Lpm nasal cannula. RT noticed there was no flow out of cannula on 4L. Tried flowmeter on other O2 source, still no flow. RT replaced the flowmeter and old flowmeter was given to charge RN as broken. Patient noticed difference in O2 flow right away. RN aware.

## 2014-07-02 NOTE — Progress Notes (Signed)
Patient stated she wanted to be placed on BiPAP as it made her "feel better". She is resting comfortably on the settings of PC 8 above PEEP of 6, rate of 8 and 30%.

## 2014-07-02 NOTE — Progress Notes (Signed)
PULMONARY / CRITICAL CARE MEDICINE   Name: Courtney Terry MRN: [id number] DOB: [birth year]    ADMISSION DATE:  06/29/2014 CONSULTATION DATE:  9/24  REFERRING MD :  Candiss Norse   CHIEF COMPLAINT:  Acute respiratory failure    HISTORY OF PRESENT ILLNESS:   53 y.o. female, with history of COPD Gold stage IV, intermittent use of home oxygen, asthma, smoking which is ongoing counseled to quit, uses crack cocaine last used 2 days ago, breast cancer status post right-sided lumpectomy, hypothyroidism, epilepsy who presented to the hospital 9/23 with two-day history of productive cough, shortness of breath and wheezing.+ subjective fever chills, no chest pain. After further questioning cough and wheezing had been progressive for about a 2 week time frame. This was following an exposure from a friend w/ URI symptoms. Symptoms significantly worse the 2 day prior to admit. Admitted to the IM service. Treated w/ empiric abx, systemic steroids and scheduled BDs. PCCM asked to see on 9/24 for progressive hypercarbic respiratory failure.  Admits to using cocaine  SUBJECTIVE:  Mild improved dyspnea  Off bipap by 3 pm yesterday Afebrile Cough +   VITAL SIGNS: Temp:  [97.8 F (36.6 C)-99.1 F (37.3 C)] 98.1 F (36.7 C) (09/26 0845) Pulse Rate:  [86-115] 88 (09/26 0600) Resp:  [12-23] 12 (09/26 0600) BP: (112-161)/(74-102) 135/74 mmHg (09/26 0600) SpO2:  [85 %-98 %] 98 % (09/26 0905) FiO2 (%):  [30 %] 30 % (09/26 0749) Weight:  [65 kg (143 lb 4.8 oz)] 65 kg (143 lb 4.8 oz) (09/26 0400) HEMODYNAMICS:   VENTILATOR SETTINGS: Vent Mode:  [-] BIPAP FiO2 (%):  [30 %] 30 % Set Rate:  [8 bmp-15 bmp] 8 bmp PEEP:  [6 cmH20] 6 cmH20 INTAKE / OUTPUT:  Intake/Output Summary (Last 24 hours) at 07/02/14 1137 Last data filed at 07/02/14 0900  Gross per 24 hour  Intake    363 ml  Output      0 ml  Net    363 ml    PHYSICAL EXAMINATION: General:  Chronically ill appearing, frail AAF sitting w decreased  accessory muscle use  Neuro:  Awake, alert, confused  HEENT:  + JVD  Cardiovascular:  rrr Lungs:  Prolonged exp wheeze, scattered rhonchi ,+ accessory muscle use  Abdomen:  Soft, non tender + bowel sounds  Musculoskeletal:  Intact  Skin:  Intact   LABS:  CBC  Recent Labs Lab 06/30/14 0321 07/01/14 0345 07/02/14 0343  WBC 7.4 17.1* 12.7*  HGB 14.1 13.4 13.4  HCT 43.5 42.2 42.0  PLT 287 254 285   Coag's No results found for this basename: APTT, INR,  in the last 168 hours BMET  Recent Labs Lab 06/30/14 0321 07/01/14 0345 07/02/14 0343  NA 138 140 142  K 4.8 4.4 5.0  CL 101 99 102  CO2 26 29 31   BUN 16 14 20   CREATININE 0.60 0.65 0.65  GLUCOSE 179* 147* 128*   Electrolytes  Recent Labs Lab 06/30/14 0321 07/01/14 0345 07/02/14 0343  CALCIUM 9.5 9.4 9.4   Sepsis Markers No results found for this basename: LATICACIDVEN, PROCALCITON, O2SATVEN,  in the last 168 hours ABG  Recent Labs Lab 06/30/14 0822 06/30/14 1033 07/01/14 0353  PHART 7.246* 7.321* 7.328*  PCO2ART 63.7* 51.8* 59.3*  PO2ART 262.0* 82.9 109.0*   Liver Enzymes  Recent Labs Lab 07/01/14 0345  AST 20  ALT 16  ALKPHOS 99  BILITOT <0.2*  ALBUMIN 3.2*   Cardiac Enzymes No results found for this basename: TROPONINI,  PROBNP,  in the last 168 hours Glucose No results found for this basename: GLUCAP,  in the last 168 hours  Imaging Dg Chest 2 View  07/01/2014   CLINICAL DATA:  Chest pain with cough and fever and shortness of breath  EXAM: CHEST  2 VIEW  COMPARISON:  Portable chest x-ray of June 29, 2014  FINDINGS: The lungs are hyperinflated with hemidiaphragm flattening. There is no focal infiltrate. The heart and pulmonary vascularity are normal. The mediastinum is normal in width. There is no pleural effusion or pneumothorax. There surgical clips projecting over the right mid chest wall and axillary region likely from previous breast surgery.  IMPRESSION: COPD without evidence of  pneumonia nor CHF or other acute cardiopulmonary abnormality.   Electronically Signed   By: David  Martinique   On: 07/01/2014 09:39     ASSESSMENT / PLAN:  PULMONARY OETT A: Acute hypercarbic respiratory failure in setting of AECOPD Tobacco abuse.   P:   NIPPV prn Scheduled BDs Solumedrol 80 q 8, if continues to improve will consider decreasing to 40 q8 in AM ABG only if worse  INFECTIOUS A:   Acute bronchitis/ AECOPD  P:  Sputum: rare GPC pairs/clusters; rare GNR >> oral flora Abx: azithro, start date: 9/23, day 1/X  ENDOCRINE A:   DM   P:   ssi   NEUROLOGIC A:   Anxiety.  Chronic benzo dependency  H/o crack cocaine  P:   Supportive care PRN xanax for anxiety  Goals of care discussed w/ pt,  She requests aggressive care and full code status by Dr. Elsworth Soho 9/25.   TODAY'S SUMMARY: Some improvement in bronchospasm, continue  w/ scheduled BDs, systemic steroids, empiric abx and NIPPV prn  I have personally obtained a history, examined the patient, evaluated laboratory and imaging results, formulated the assessment and plan and placed orders.  Kara Mead MD. [facility] Flood. [facility] Pager [phone] If no response call [phone]    07/02/2014, 11:37 AM

## 2014-07-02 NOTE — Progress Notes (Addendum)
Patient Demographics  Courtney Terry, is a 53 y.o. female, DOB - [birth year], KXF:[id number]  Admit date - 06/29/2014   Admitting Physician Thurnell Lose, MD  Outpatient Primary MD for the patient is Dawayne Cirri, MD  LOS - 3   Chief Complaint  Patient presents with  . Shortness of Breath        Subjective:   Courtney Terry today has, No headache, No chest pain, No abdominal pain - No Nausea, No new weakness tingling or numbness, improved Cough - SOB.    Assessment & Plan    1. Acute on chronic respiratory failure due to COPD exacerbation and URI. Much improved after BiPAP on 06/30/2014,  pending sputum Gram stain culture, continue on azithromycin along with scheduled IV steroids, scheduled and when necessary nebulizer treatments. Pulmonary following, overall some better, will increase activity , needed BiPAP again on the night of 07/01/2014, monitor in stepped-down one more day.    2. History of smoking and cocaine use last 2 days before admission. Counseled to quit. Avoid beta blocker. Also has reactive airway disease.     3. History of seizures. Continue Depakote.     4. Depression anxiety continue Abilify, Prozac and Xanax.     5. History of right-sided breast cancer status post lumpectomy. No acute issues outpatient followup.    6. Leukocytosis likely due to steroids. Afebrile, stable repeat 2 view chest x-ray, counts improving.      Code Status: Full  Family Communication: None present  Disposition Plan:   stepdown   Procedures chest x-ray unremarkable  Consults  pulmonary   Medications  Scheduled Meds: . albuterol  2.5 mg Nebulization Q3H  . antiseptic oral rinse  7 mL Mouth Rinse q12n4p  . ARIPiprazole  2 mg Oral Daily  . azithromycin  500 mg  Intravenous Q24H  . chlorhexidine  15 mL Mouth Rinse BID  . divalproex  1,000 mg Oral BID  . enoxaparin (LOVENOX) injection  40 mg Subcutaneous Q24H  . FLUoxetine  40 mg Oral Daily  . ipratropium  0.5 mg Nebulization Q6H WA  . methylPREDNISolone (SOLU-MEDROL) injection  80 mg Intravenous 3 times per day  . sodium chloride  3 mL Intravenous Q12H   Continuous Infusions:  PRN Meds:.ALPRAZolam, alum & mag hydroxide-simeth, dicyclomine, guaiFENesin-dextromethorphan, HYDROcodone-acetaminophen, ondansetron (ZOFRAN) IV, polyethylene glycol  DVT Prophylaxis  Lovenox    Lab Results  Component Value Date   PLT 285 07/02/2014    Antibiotics     Anti-infectives   Start     Dose/Rate Route Frequency Ordered Stop   07/01/14 1000  azithromycin (ZITHROMAX) 500 mg in dextrose 5 % 250 mL IVPB     500 mg 250 mL/hr over 60 Minutes Intravenous Every 24 hours 06/30/14 1007     06/29/14 1615  azithromycin (ZITHROMAX) tablet 500 mg  Status:  Discontinued     500 mg Oral Daily 06/29/14 1607 06/30/14 1007          Objective:   Filed Vitals:   07/02/14 0500 07/02/14 0515 07/02/14 0600 07/02/14 0734  BP:   135/74   Pulse: 86  88   Temp:      TempSrc:      Resp: 12  12  Height:      Weight:      SpO2: 95% 97% 94% 92%    Wt Readings from Last 3 Encounters:  07/02/14 65 kg (143 lb 4.8 oz)  10/18/13 66.679 kg (147 lb)  08/26/13 65.953 kg (145 lb 6.4 oz)     Intake/Output Summary (Last 24 hours) at 07/02/14 0745 Last data filed at 07/01/14 2300  Gross per 24 hour  Intake    243 ml  Output      0 ml  Net    243 ml     Physical Exam  Awake Alert, Oriented X 3, No new F.N deficits, Normal affect [place].AT,PERRAL Supple Neck,No JVD, No cervical lymphadenopathy appriciated.  Symmetrical Chest wall movement, minimal bilateral air movement with expiratory wheezing RRR,No Gallops,Rubs or new Murmurs, No Parasternal Heave +ve B.Sounds, Abd Soft, No tenderness, No organomegaly appriciated, No  rebound - guarding or rigidity. No Cyanosis, Clubbing or edema, No new Rash or bruise      Data Review   Micro Results Recent Results (from the past 240 hour(s))  MRSA PCR SCREENING     Status: None   Collection Time    06/29/14  5:56 PM      Result Value Ref Range Status   MRSA by PCR NEGATIVE  NEGATIVE Final   Comment:            The GeneXpert MRSA Assay (FDA     approved for NASAL specimens     only), is one component of a     comprehensive MRSA colonization     surveillance program. It is not     intended to diagnose MRSA     infection nor to guide or     monitor treatment for     MRSA infections.  CULTURE, EXPECTORATED SPUTUM-ASSESSMENT     Status: None   Collection Time    06/29/14  6:34 PM      Result Value Ref Range Status   Specimen Description SPUTUM   Final   Special Requests NONE   Final   Sputum evaluation     Final   Value: THIS SPECIMEN IS ACCEPTABLE. RESPIRATORY CULTURE REPORT TO FOLLOW.   Report Status 06/29/2014 FINAL   Final  CULTURE, RESPIRATORY (NON-EXPECTORATED)     Status: None   Collection Time    06/29/14  6:34 PM      Result Value Ref Range Status   Specimen Description SPUTUM   Final   Special Requests NONE   Final   Gram Stain     Final   Value: FEW WBC PRESENT,BOTH PMN AND MONONUCLEAR     RARE SQUAMOUS EPITHELIAL CELLS PRESENT     RARE GRAM POSITIVE COCCI     IN PAIRS IN CLUSTERS RARE GRAM NEGATIVE RODS     Performed at [facility]   Culture     Final   Value: NORMAL OROPHARYNGEAL FLORA     Performed at [facility]   Report Status PENDING   Incomplete    Radiology Reports Dg Chest Portable 1 View  06/29/2014   CLINICAL DATA:  Shortness of breath and chest pain  EXAM: PORTABLE CHEST - 1 VIEW  COMPARISON:  Mar 06, 2014  FINDINGS: There is underlying emphysematous change. There is bibasilar interstitial prominence which in part is felt to be due to redistribution of blood flow to viable segments of lung. There is no  consolidation. Heart size is normal. Pulmonary vascularity reflects underlying emphysema. Postoperative change  in the right breast and axillary regions are stable. No apparent adenopathy. No bone lesions. Nipple shadow noted on left.  IMPRESSION: Underlying emphysematous change. No frank edema or consolidation. Postoperative change on right.   Electronically Signed   By: Lowella Grip M.D.   On: 06/29/2014 11:11     CBC  Recent Labs Lab 06/29/14 1040 06/29/14 1046 06/30/14 0321 07/01/14 0345 07/02/14 0343  WBC 11.9*  --  7.4 17.1* 12.7*  HGB 15.1* 17.7* 14.1 13.4 13.4  HCT 45.4 52.0* 43.5 42.2 42.0  PLT 290  --  287 254 285  MCV 82.4  --  84.0 85.9 84.8  MCH 27.4  --  27.2 27.3 27.1  MCHC 33.3  --  32.4 31.8 31.9  RDW 15.0  --  15.1 15.1 15.4  LYMPHSABS 1.6  --   --   --   --   MONOABS 0.8  --   --   --   --   EOSABS 0.1  --   --   --   --   BASOSABS 0.0  --   --   --   --     Chemistries   Recent Labs Lab 06/29/14 1046 06/29/14 1128 06/30/14 0321 07/01/14 0345 07/02/14 0343  NA 135* 140 138 140 142  K 7.1* 3.9 4.8 4.4 5.0  CL 106 104 101 99 102  CO2  --  23 26 29 31   GLUCOSE 102* 106* 179* 147* 128*  BUN 11 10 16 14 20   CREATININE 0.70 0.64 0.60 0.65 0.65  CALCIUM  --  8.6 9.5 9.4 9.4  AST  --   --   --  20  --   ALT  --   --   --  16  --   ALKPHOS  --   --   --  99  --   BILITOT  --   --   --  <0.2*  --    ------------------------------------------------------------------------------------------------------------------ estimated creatinine clearance is 84.4 ml/min (by C-G formula based on Cr of 0.65). ------------------------------------------------------------------------------------------------------------------ No results found for this basename: HGBA1C,  in the last 72 hours ------------------------------------------------------------------------------------------------------------------ No results found for this basename: CHOL, HDL, LDLCALC, TRIG,  CHOLHDL, LDLDIRECT,  in the last 72 hours ------------------------------------------------------------------------------------------------------------------ No results found for this basename: TSH, T4TOTAL, FREET3, T3FREE, THYROIDAB,  in the last 72 hours ------------------------------------------------------------------------------------------------------------------ No results found for this basename: VITAMINB12, FOLATE, FERRITIN, TIBC, IRON, RETICCTPCT,  in the last 72 hours  Coagulation profile No results found for this basename: INR, PROTIME,  in the last 168 hours  No results found for this basename: DDIMER,  in the last 72 hours  Cardiac Enzymes No results found for this basename: CK, CKMB, TROPONINI, MYOGLOBIN,  in the last 168 hours ------------------------------------------------------------------------------------------------------------------ No components found with this basename: POCBNP,      Time Spent in minutes   35   Courtney Terry K M.D on 07/02/2014 at 7:45 AM  Between 7am to 7pm - Pager - [phone]  After 7pm go to [email or web address] - password [email or web address]  And look for the night coverage person covering for me after hours  [facility] Office  [phone]   **Disclaimer: This note may have been dictated with voice recognition software. Similar sounding words can inadvertently be transcribed and this note may contain transcription errors which may not have been corrected upon publication of note.**

## 2014-07-03 DIAGNOSIS — F17200 Nicotine dependence, unspecified, uncomplicated: Secondary | ICD-10-CM

## 2014-07-03 LAB — BASIC METABOLIC PANEL
ANION GAP: 7 (ref 5–15)
BUN: 21 mg/dL (ref 6–23)
CALCIUM: 9.1 mg/dL (ref 8.4–10.5)
CO2: 35 meq/L — AB (ref 19–32)
Chloride: 101 mEq/L (ref 96–112)
Creatinine, Ser: 0.56 mg/dL (ref 0.50–1.10)
GFR calc Af Amer: >90 mL/min (ref >90)
GFR calc non Af Amer: >90 mL/min (ref >90)
Glucose, Bld: 136 mg/dL — ABNORMAL HIGH (ref 70–99)
Potassium: 5 mEq/L (ref 3.7–5.3)
SODIUM: 143 meq/L (ref 137–147)

## 2014-07-03 MED ORDER — CETYLPYRIDINIUM CHLORIDE 0.05 % MT LIQD
7.0000 mL | Freq: Two times a day (BID) | OROMUCOSAL | Status: DC
  Administered 2014-07-03 – 2014-07-05 (×3): 7 mL via OROMUCOSAL

## 2014-07-03 MED ORDER — AZITHROMYCIN 500 MG PO TABS
500.0000 mg | ORAL_TABLET | Freq: Every day | ORAL | Status: AC
  Administered 2014-07-03: 500 mg via ORAL
  Administered 2014-07-04: 250 mg via ORAL
  Filled 2014-07-03 (×2): qty 1

## 2014-07-03 MED ORDER — PREDNISONE 50 MG PO TABS
60.0000 mg | ORAL_TABLET | Freq: Every day | ORAL | Status: DC
Start: 2014-07-03 — End: 2014-07-05
  Administered 2014-07-03 – 2014-07-05 (×3): 60 mg via ORAL
  Filled 2014-07-03 (×5): qty 1

## 2014-07-03 NOTE — Progress Notes (Signed)
Patient Demographics  Courtney Terry, is a 53 y.o. female, DOB - 1961/02/17, RWE:[id number]  Admit date - 06/29/2014   Admitting Physician Thurnell Lose, MD  Outpatient Primary MD for the patient is Dawayne Cirri, MD  LOS - 4   Chief Complaint  Patient presents with  . Shortness of Breath      Summary  Courtney Terry is a 53 y.o. female, with history of COPD Gold stage IV, intermittent use of home oxygen, asthma, smoking which is ongoing counseled to quit, uses crack cocaine last used 2 days ago, breast cancer status post right-sided lumpectomy, hypothyroidism, epilepsy who comes to the hospital with two-day history of productive cough, shortness of breath and wheezing. No fever chills, no chest pain, no recent long drives, no personal family history of DVT or PE. In the ER workup consistent with acute on chronic respiratory failure due to COPD exacerbation.     Subjective:   Courtney Terry today has, No headache, No chest pain, No abdominal pain - No Nausea, No new weakness tingling or numbness, improved Cough - SOB.    Assessment & Plan    1. Acute on chronic respiratory failure due to COPD exacerbation and URI. Much improved after azithromycin, IV steroids and initial BiPAP, now moving decent air and reduced wheezing, will move her out of stepdown, continue on oxygen titrate to keep pulse ox above 90, note she uses 2 L nasal cannula oxygen intermittently at home. Nebulizer treatments as needed. Appreciate pulmonary input.   2. History of smoking and cocaine use last 2 days before admission. Counseled to quit. Avoid beta blocker. Also has reactive airway disease.     3. History of seizures. Continue Depakote.     4. Depression anxiety continue Abilify, Prozac and Xanax.     5.  History of right-sided breast cancer status post lumpectomy. No acute issues outpatient followup.    6. Leukocytosis likely due to steroids. Afebrile, stable repeat 2 view chest x-ray, counts improving.      Code Status: Full  Family Communication: None present  Disposition Plan:   stepdown   Procedures chest x-ray unremarkable  Consults  pulmonary   Medications  Scheduled Meds: . albuterol  2.5 mg Nebulization Q3H  . antiseptic oral rinse  7 mL Mouth Rinse q12n4p  . ARIPiprazole  2 mg Oral Daily  . azithromycin  500 mg Oral Daily  . chlorhexidine  15 mL Mouth Rinse BID  . divalproex  1,000 mg Oral BID  . docusate sodium  100 mg Oral BID  . enoxaparin (LOVENOX) injection  40 mg Subcutaneous Q24H  . FLUoxetine  40 mg Oral Daily  . ipratropium  0.5 mg Nebulization Q6H WA  . polyethylene glycol  17 g Oral BID  . predniSONE  60 mg Oral Q breakfast  . sodium chloride  3 mL Intravenous Q12H   Continuous Infusions:  PRN Meds:.ALPRAZolam, alum & mag hydroxide-simeth, dicyclomine, guaiFENesin-dextromethorphan, HYDROcodone-acetaminophen, ondansetron (ZOFRAN) IV  DVT Prophylaxis  Lovenox    Lab Results  Component Value Date   PLT 285 07/02/2014    Antibiotics     Anti-infectives   Start     Dose/Rate Route Frequency Ordered Stop   07/03/14 1000  azithromycin (ZITHROMAX) tablet  500 mg     500 mg Oral Daily 07/03/14 0728 07/05/14 0959   07/01/14 1000  azithromycin (ZITHROMAX) 500 mg in dextrose 5 % 250 mL IVPB  Status:  Discontinued     500 mg 250 mL/hr over 60 Minutes Intravenous Every 24 hours 06/30/14 1007 07/03/14 0728   06/29/14 1615  azithromycin (ZITHROMAX) tablet 500 mg  Status:  Discontinued     500 mg Oral Daily 06/29/14 1607 06/30/14 1007          Objective:   Filed Vitals:   07/03/14 0500 07/03/14 0509 07/03/14 0600 07/03/14 0804  BP: 121/92  121/88   Pulse: 84  83   Temp:      TempSrc:      Resp: 11  10   Height:      Weight: 67.2 kg (148 lb  2.4 oz)     SpO2: 99% 98% 99% 100%    Wt Readings from Last 3 Encounters:  07/03/14 67.2 kg (148 lb 2.4 oz)  10/18/13 66.679 kg (147 lb)  08/26/13 65.953 kg (145 lb 6.4 oz)     Intake/Output Summary (Last 24 hours) at 07/03/14 0846 Last data filed at 07/02/14 2214  Gross per 24 hour  Intake    983 ml  Output      0 ml  Net    983 ml     Physical Exam  Awake Alert, Oriented X 3, No new F.N deficits, Normal affect [place].AT,PERRAL Supple Neck,No JVD, No cervical lymphadenopathy appriciated.  Symmetrical Chest wall movement, minimal bilateral air movement with expiratory wheezing RRR,No Gallops,Rubs or new Murmurs, No Parasternal Heave +ve B.Sounds, Abd Soft, No tenderness, No organomegaly appriciated, No rebound - guarding or rigidity. No Cyanosis, Clubbing or edema, No new Rash or bruise      Data Review   Micro Results Recent Results (from the past 240 hour(s))  MRSA PCR SCREENING     Status: None   Collection Time    06/29/14  5:56 PM      Result Value Ref Range Status   MRSA by PCR NEGATIVE  NEGATIVE Final   Comment:            The GeneXpert MRSA Assay (FDA     approved for NASAL specimens     only), is one component of a     comprehensive MRSA colonization     surveillance program. It is not     intended to diagnose MRSA     infection nor to guide or     monitor treatment for     MRSA infections.  CULTURE, EXPECTORATED SPUTUM-ASSESSMENT     Status: None   Collection Time    06/29/14  6:34 PM      Result Value Ref Range Status   Specimen Description SPUTUM   Final   Special Requests NONE   Final   Sputum evaluation     Final   Value: THIS SPECIMEN IS ACCEPTABLE. RESPIRATORY CULTURE REPORT TO FOLLOW.   Report Status 06/29/2014 FINAL   Final  CULTURE, RESPIRATORY (NON-EXPECTORATED)     Status: None   Collection Time    06/29/14  6:34 PM      Result Value Ref Range Status   Specimen Description SPUTUM   Final   Special Requests NONE   Final   Gram Stain      Final   Value: FEW WBC PRESENT,BOTH PMN AND MONONUCLEAR     RARE SQUAMOUS EPITHELIAL CELLS PRESENT  RARE GRAM POSITIVE COCCI     IN PAIRS IN CLUSTERS RARE GRAM NEGATIVE RODS     Performed at [facility]   Culture     Final   Value: NORMAL OROPHARYNGEAL FLORA     Performed at [facility]   Report Status 07/02/2014 FINAL   Final    Radiology Reports Dg Chest Portable 1 View  06/29/2014   CLINICAL DATA:  Shortness of breath and chest pain  EXAM: PORTABLE CHEST - 1 VIEW  COMPARISON:  Mar 06, 2014  FINDINGS: There is underlying emphysematous change. There is bibasilar interstitial prominence which in part is felt to be due to redistribution of blood flow to viable segments of lung. There is no consolidation. Heart size is normal. Pulmonary vascularity reflects underlying emphysema. Postoperative change in the right breast and axillary regions are stable. No apparent adenopathy. No bone lesions. Nipple shadow noted on left.  IMPRESSION: Underlying emphysematous change. No frank edema or consolidation. Postoperative change on right.   Electronically Signed   By: Lowella Grip M.D.   On: 06/29/2014 11:11     CBC  Recent Labs Lab 06/29/14 1040 06/29/14 1046 06/30/14 0321 07/01/14 0345 07/02/14 0343  WBC 11.9*  --  7.4 17.1* 12.7*  HGB 15.1* 17.7* 14.1 13.4 13.4  HCT 45.4 52.0* 43.5 42.2 42.0  PLT 290  --  287 254 285  MCV 82.4  --  84.0 85.9 84.8  MCH 27.4  --  27.2 27.3 27.1  MCHC 33.3  --  32.4 31.8 31.9  RDW 15.0  --  15.1 15.1 15.4  LYMPHSABS 1.6  --   --   --   --   MONOABS 0.8  --   --   --   --   EOSABS 0.1  --   --   --   --   BASOSABS 0.0  --   --   --   --     Chemistries   Recent Labs Lab 06/29/14 1128 06/30/14 0321 07/01/14 0345 07/02/14 0343 07/03/14 0353  NA 140 138 140 142 143  K 3.9 4.8 4.4 5.0 5.0  CL 104 101 99 102 101  CO2 23 26 29 31  35*  GLUCOSE 106* 179* 147* 128* 136*  BUN 10 16 14 20 21   CREATININE 0.64 0.60 0.65 0.65 0.56   CALCIUM 8.6 9.5 9.4 9.4 9.1  AST  --   --  20  --   --   ALT  --   --  16  --   --   ALKPHOS  --   --  99  --   --   BILITOT  --   --  <0.2*  --   --    ------------------------------------------------------------------------------------------------------------------ estimated creatinine clearance is 87.3 ml/min (by C-G formula based on Cr of 0.56). ------------------------------------------------------------------------------------------------------------------ No results found for this basename: HGBA1C,  in the last 72 hours ------------------------------------------------------------------------------------------------------------------ No results found for this basename: CHOL, HDL, LDLCALC, TRIG, CHOLHDL, LDLDIRECT,  in the last 72 hours ------------------------------------------------------------------------------------------------------------------ No results found for this basename: TSH, T4TOTAL, FREET3, T3FREE, THYROIDAB,  in the last 72 hours ------------------------------------------------------------------------------------------------------------------ No results found for this basename: VITAMINB12, FOLATE, FERRITIN, TIBC, IRON, RETICCTPCT,  in the last 72 hours  Coagulation profile No results found for this basename: INR, PROTIME,  in the last 168 hours  No results found for this basename: DDIMER,  in the last 72 hours  Cardiac Enzymes No results found for this basename: CK, CKMB,  TROPONINI, MYOGLOBIN,  in the last 168 hours ------------------------------------------------------------------------------------------------------------------ No components found with this basename: POCBNP,      Time Spent in minutes   35   SINGH,PRASHANT K M.D on 07/03/2014 at 8:46 AM  Between 7am to 7pm - Pager - [phone]  After 7pm go to [email or web address] - password [email or web address]  And look for the night coverage person covering for me after hours  [facility] Office   [phone]   **Disclaimer: This note may have been dictated with voice recognition software. Similar sounding words can inadvertently be transcribed and this note may contain transcription errors which may not have been corrected upon publication of note.**

## 2014-07-03 NOTE — Progress Notes (Signed)
PULMONARY / CRITICAL CARE MEDICINE   Name: Courtney Terry MRN: [id number] DOB: 1961-06-01    ADMISSION DATE:  06/29/2014 CONSULTATION DATE:  9/24  REFERRING MD :  Candiss Norse   CHIEF COMPLAINT:  Acute respiratory failure    HISTORY OF PRESENT ILLNESS:   53 y.o. female, with history of COPD Gold stage IV, intermittent use of home oxygen, asthma, smoking which is ongoing counseled to quit, uses crack cocaine last used 2 days ago, breast cancer status post right-sided lumpectomy, hypothyroidism, epilepsy who presented to the hospital 9/23 with two-day history of productive cough, shortness of breath and wheezing.+ subjective fever chills, no chest pain. After further questioning cough and wheezing had been progressive for about a 2 week time frame. This was following an exposure from a friend w/ URI symptoms. Symptoms significantly worse the 2 day prior to admit. Admitted to the IM service. Treated w/ empiric abx, systemic steroids and scheduled BDs. PCCM asked to see on 9/24 for progressive hypercarbic respiratory failure.  Admits to using cocaine  SUBJECTIVE:  Minimally improved dyspnea  She feels this started as a cold. Nebs help "a little"   VITAL SIGNS: Temp:  [98.6 F (37 C)-99 F (37.2 C)] 98.8 F (37.1 C) (09/27 1035) Pulse Rate:  [35-116] 83 (09/27 1035) Resp:  [9-26] 18 (09/27 1035) BP: (101-166)/(61-106) 140/96 mmHg (09/27 1035) SpO2:  [95 %-100 %] 100 % (09/27 1107) Weight:  [67.2 kg (148 lb 2.4 oz)-71.668 kg (158 lb)] 71.668 kg (158 lb) (09/27 1035) HEMODYNAMICS:   VENTILATOR SETTINGS:   INTAKE / OUTPUT:  Intake/Output Summary (Last 24 hours) at 07/03/14 1202 Last data filed at 07/02/14 2214  Gross per 24 hour  Intake    243 ml  Output      0 ml  Net    243 ml    PHYSICAL EXAMINATION: General:  Slender, NAD, lying in bed eating with fingers from lunch on tray table Neuro:  Awake, alert, affect a little odd- distant HEENT:  + JVD, hard of hearing Cardiovascular:   Rrr, no m Lungs:  Prolonged exp wheeze, scattered rhonchi , No- accessory muscle use  Abdomen:  Soft, non tender + bowel sounds  Musculoskeletal:  Intact  Skin:  Intact   LABS:  CBC  Recent Labs Lab 06/30/14 0321 07/01/14 0345 07/02/14 0343  WBC 7.4 17.1* 12.7*  HGB 14.1 13.4 13.4  HCT 43.5 42.2 42.0  PLT 287 254 285   Coag's No results found for this basename: APTT, INR,  in the last 168 hours BMET  Recent Labs Lab 07/01/14 0345 07/02/14 0343 07/03/14 0353  NA 140 142 143  K 4.4 5.0 5.0  CL 99 102 101  CO2 29 31 35*  BUN 14 20 21   CREATININE 0.65 0.65 0.56  GLUCOSE 147* 128* 136*   Electrolytes  Recent Labs Lab 07/01/14 0345 07/02/14 0343 07/03/14 0353  CALCIUM 9.4 9.4 9.1   Sepsis Markers No results found for this basename: LATICACIDVEN, PROCALCITON, O2SATVEN,  in the last 168 hours ABG  Recent Labs Lab 06/30/14 0822 06/30/14 1033 07/01/14 0353  PHART 7.246* 7.321* 7.328*  PCO2ART 63.7* 51.8* 59.3*  PO2ART 262.0* 82.9 109.0*   Liver Enzymes  Recent Labs Lab 07/01/14 0345  AST 20  ALT 16  ALKPHOS 99  BILITOT <0.2*  ALBUMIN 3.2*   Cardiac Enzymes No results found for this basename: TROPONINI, PROBNP,  in the last 168 hours Glucose No results found for this basename: GLUCAP,  in the last 168 hours  Imaging No results found.   ASSESSMENT / PLAN:  PULMONARY OETT A: Acute hypercarbic respiratory failure in setting of AECOPD, viral URI triggered Asthma with COPD Tobacco abuse.   P:   NIPPV prn Scheduled BDs Solumedrol 80 q 8, if continues to improve will consider decreasing to 40 q8 in AM ABG only if worse  INFECTIOUS A:   Acute bronchitis/ AECOPD - likely viral P:  Sputum: rare GPC pairs/clusters; rare GNR >> oral flora Abx: azithro, start date: 9/23, day 4/X  ENDOCRINE A:   DM   P:   ssi   NEUROLOGIC A:   Anxiety.  Chronic benzo dependency  H/o crack cocaine  P:   Supportive care PRN xanax for  anxiety  Goals of care discussed w/ pt,  She requested aggressive care and full code status by Dr. Elsworth Soho 9/25.   TODAY'S SUMMARY: Slow improvement in bronchospasm, continue  w/ scheduled BDs, systemic steroids, empiric abx and NIPPV prn    CD Annamaria Boots, MD [facility] Pager [phone] If no response call [phone]    07/03/2014, 12:02 PM

## 2014-07-04 LAB — POCT I-STAT, CHEM 8
BUN: 11 mg/dL (ref 6–23)
CALCIUM ION: 1.09 mmol/L — AB (ref 1.12–1.23)
Chloride: 106 mEq/L (ref 96–112)
Creatinine, Ser: 0.7 mg/dL (ref 0.50–1.10)
Glucose, Bld: 102 mg/dL — ABNORMAL HIGH (ref 70–99)
HCT: 52 % — ABNORMAL HIGH (ref 36.0–46.0)
Hemoglobin: 17.7 g/dL — ABNORMAL HIGH (ref 12.0–15.0)
Potassium: 7.1 mEq/L (ref 3.7–5.3)
Sodium: 135 mEq/L — ABNORMAL LOW (ref 137–147)
TCO2: 28 mmol/L (ref 0–100)

## 2014-07-04 MED ORDER — IPRATROPIUM BROMIDE 0.02 % IN SOLN: 0.5000 mg | RESPIRATORY_TRACT | Status: DC | PRN

## 2014-07-04 MED ORDER — TIOTROPIUM BROMIDE MONOHYDRATE 18 MCG IN CAPS
18.0000 ug | ORAL_CAPSULE | Freq: Every day | RESPIRATORY_TRACT | Status: DC
  Administered 2014-07-04 – 2014-07-05 (×2): 18 ug via RESPIRATORY_TRACT
  Filled 2014-07-04: qty 5

## 2014-07-04 MED ORDER — ALBUTEROL SULFATE (2.5 MG/3ML) 0.083% IN NEBU: 2.5000 mg | INHALATION_SOLUTION | RESPIRATORY_TRACT | Status: DC | PRN

## 2014-07-04 MED ORDER — MOMETASONE FURO-FORMOTEROL FUM 100-5 MCG/ACT IN AERO
2.0000 | INHALATION_SPRAY | Freq: Two times a day (BID) | RESPIRATORY_TRACT | Status: DC
  Administered 2014-07-04 – 2014-07-05 (×3): 2 via RESPIRATORY_TRACT
  Filled 2014-07-04: qty 8.8

## 2014-07-04 NOTE — Progress Notes (Signed)
PULMONARY / CRITICAL CARE MEDICINE   Name: Courtney Terry MRN: [id number] DOB: Apr 27, 1961    ADMISSION DATE:  06/29/2014 CONSULTATION DATE:  06/30/2014  REFERRING MD :  Candiss Norse   CHIEF COMPLAINT:  Acute respiratory failure   HISTORY OF PRESENT ILLNESS:   53 yo female smoker and hx of cocaine use presented with productive cough, wheeze and dyspnea from AECOPD.  She has hx of GOLD IV COPD on home oxygen, and followed by Dr. Melvyn Novas in pulmonary office.  SUBJECTIVE:  Breathing better.  C/o sinus congestion.  Wheezing improved.  Wants to know how to set up home oxygen  VITAL SIGNS: Temp:  [98 F (36.7 C)-98.8 F (37.1 C)] 98.4 F (36.9 C) (09/28 0545) Pulse Rate:  [83-94] 90 (09/28 0545) Resp:  [18] 18 (09/28 0545) BP: (118-140)/(78-96) 136/78 mmHg (09/28 0545) SpO2:  [96 %-100 %] 97 % (09/28 0738) Weight:  [158 lb (71.668 kg)] 158 lb (71.668 kg) (09/27 1035) INTAKE / OUTPUT:  Intake/Output Summary (Last 24 hours) at 07/04/14 1002 Last data filed at 07/03/14 1600  Gross per 24 hour  Intake    120 ml  Output      0 ml  Net    120 ml    PHYSICAL EXAMINATION: General: no distress Neuro: normal strength HEENT: clear nasal drainage, no sinus tenderness Cardiovascular: regular Lungs: no wheezing Abdomen:  Soft, non tender Musculoskeletal: no edema Skin: no rashes  LABS:  CBC  Recent Labs Lab 06/30/14 0321 07/01/14 0345 07/02/14 0343  WBC 7.4 17.1* 12.7*  HGB 14.1 13.4 13.4  HCT 43.5 42.2 42.0  PLT 287 254 285   BMET  Recent Labs Lab 07/01/14 0345 07/02/14 0343 07/03/14 0353  NA 140 142 143  K 4.4 5.0 5.0  CL 99 102 101  CO2 29 31 35*  BUN 14 20 21   CREATININE 0.65 0.65 0.56  GLUCOSE 147* 128* 136*   Electrolytes  Recent Labs Lab 07/01/14 0345 07/02/14 0343 07/03/14 0353  CALCIUM 9.4 9.4 9.1   ABG  Recent Labs Lab 06/30/14 0822 06/30/14 1033 07/01/14 0353  PHART 7.246* 7.321* 7.328*  PCO2ART 63.7* 51.8* 59.3*  PO2ART 262.0* 82.9 109.0*    Liver Enzymes  Recent Labs Lab 07/01/14 0345  AST 20  ALT 16  ALKPHOS 99  BILITOT <0.2*  ALBUMIN 3.2*   Imaging No results found.   ASSESSMENT / PLAN:  Acute on chronic hypoxic/hypercapnic respiratory failure 2nd to AECOPD with URI, continue tobacco abuse and cocaine abuse. Hx of GOLD 4 COPD with nocturnal hypoxemia prior to this admission. Plan: Resume dulera  Add spiriva  Change albuterol/ipratropium to prn Continue oxygen to keep SaO2 88 to 94% >> need to assess for daytime oxygen use at home Wean off prednisone as tolerated  Bronchial hygiene Day 6 of zithromax, started on 9/23 >> defer to primary team Tobacco/illicit substance cessation  I have scheduled her for outpatient follow up with Dr. Melvyn Novas on Monday, October 5, and 2:15 pm.  PCCM will sign off.  Please call if additional help needed  Chesley Mires, MD [place] 07/04/2014, 10:10 AM Pager:  [phone] After 3pm call: [phone]

## 2014-07-04 NOTE — Evaluation (Signed)
Physical Therapy Evaluation Patient Details Name: Courtney Terry MRN: [id number] DOB: [birth year] Today's Date: 07/04/2014  SATURATION QUALIFICATIONS: (This note is used to comply with regulatory documentation for home oxygen)  Patient Saturations on Room Air at Rest = 90%  Patient Saturations on Room Air while Ambulating = 85%  Patient Saturations on 3 Liters of oxygen while Ambulating = 92%   History of Present Illness  53 yo female admitted with acute respiratory failure, COPD exac. Hx of asthma, emphysema, breast cancer, PTSD, bipolar d/o. Pt lives alone.  Clinical Impression  On eval, pt required Min assist for mobility-able to ambulate ~115 feet x 2 (with and without walker). Pt is unsteady with ambulation. Some risk for falling noted during session and friend reports pt has had falls at home. Recommend HHPT, supervision for OOB/mobility, RW, and for pt to resume aide services.     Follow Up Recommendations Home health PT;Supervision for mobility/OOB;home health aide    Equipment Recommendations  Rolling walker with 5" wheels    Recommendations for Other Services       Precautions / Restrictions Precautions Precautions: Fall Precaution Comments: has home O2 already but does not wear continuously Restrictions Weight Bearing Restrictions: No      Mobility  Bed Mobility Overal bed mobility: Modified Independent                Transfers Overall transfer level: Needs assistance   Transfers: Sit to/from Stand Sit to Stand: Supervision            Ambulation/Gait Ambulation/Gait assistance: Min assist Ambulation Distance (Feet): 115 Feet (x2) Assistive device: Rolling walker (2 wheeled);None Gait Pattern/deviations: Step-through pattern;Decreased stride length;Staggering right;Staggering left;Drifts right/left     General Gait Details: Min assist for ambulation with and without RW. Unsteady. Fatigues easily. VCS safety, distance from RW, avoidance of  obstacles. Ambulated on and off O2. Dyspnea 3/4 with ambulation  Stairs            Wheelchair Mobility    Modified Rankin (Stroke Patients Only)       Balance Overall balance assessment: History of Falls;Needs assistance         Standing balance support: No upper extremity supported;During functional activity Standing balance-Leahy Scale: Fair                               Pertinent Vitals/Pain Pain Assessment: No/denies pain    Home Living Family/patient expects to be discharged to:: Private residence Living Arrangements: Alone Available Help at Discharge: Personal care attendant (2-3 hours/day) Type of Home: Apartment Home Access: Stairs to enter Entrance Stairs-Rails: None Entrance Stairs-Number of Steps: 3 Home Layout: One level Home Equipment: None      Prior Function Level of Independence: Independent;Needs assistance      ADL's / Homemaking Assistance Needed: aide helps        Hand Dominance        Extremity/Trunk Assessment   Upper Extremity Assessment: Overall WFL for tasks assessed           Lower Extremity Assessment: Generalized weakness      Cervical / Trunk Assessment: Normal  Communication   Communication: No difficulties  Cognition Arousal/Alertness: Awake/alert Behavior During Therapy: WFL for tasks assessed/performed Overall Cognitive Status: Within Functional Limits for tasks assessed                      General Comments  Exercises        Assessment/Plan    PT Assessment Patient needs continued PT services  PT Diagnosis Difficulty walking;Generalized weakness   PT Problem List Decreased strength;Decreased activity tolerance;Decreased balance;Decreased mobility;Cardiopulmonary status limiting activity  PT Treatment Interventions DME instruction;Gait training;Functional mobility training;Therapeutic activities;Patient/family education;Balance training;Therapeutic exercise   PT Goals  (Current goals can be found in the Care Plan section) Acute Rehab PT Goals Patient Stated Goal: home PT Goal Formulation: With patient Time For Goal Achievement: 07/11/14 Potential to Achieve Goals: Good    Frequency Min 3X/week   Barriers to discharge        Co-evaluation               End of Session Equipment Utilized During Treatment: Gait belt;Oxygen Activity Tolerance: Patient limited by fatigue Patient left: in bed;with call bell/phone within reach;with family/visitor present           Time: 0354-6568 PT Time Calculation (min): 35 min   Charges:   PT Evaluation $Initial PT Evaluation Tier I: 1 Procedure PT Treatments $Gait Training: 23-37 mins   PT G Codes:          Weston Anna, MPT Pager: [phone]

## 2014-07-04 NOTE — Progress Notes (Signed)
SATURATION QUALIFICATIONS: (This note is used to comply with regulatory documentation for home oxygen) ° °Patient Saturations on Room Air at Rest = 92% ° °Patient Saturations on Room Air while Ambulating = 85% ° °Patient Saturations on 3 Liters of oxygen while Ambulating = 92% ° °Please briefly explain why patient needs home oxygen: ° °

## 2014-07-04 NOTE — Care Management Note (Addendum)
    Page 1 of 2   07/05/2014     12:20:45 PM CARE MANAGEMENT NOTE 07/05/2014  Patient:  Courtney Terry, Courtney Terry   Account Number:  [id number]  Date Initiated:  06/30/2014  Documentation initiated by:  DAVIS,RHONDA  Subjective/Objective Assessment:   pt with ards, hx of copd  continues to smoke and use coccaine     Action/Plan:   home when stable   Anticipated DC Date:  07/05/2014   Anticipated DC Plan:  [place]  In-house referral  Clinical Social Worker      DC Planning Services  CM consult      Choice offered to / List presented to:  C-1 Patient   DME arranged  [facility]  NEBULIZER MACHINE      DME agency  [facility] arranged  HH-1 RN      [place].   Status of service:  Completed, signed off Medicare Important Message given?   (If response is "NO", the following Medicare IM given date fields will be blank) Date Medicare IM given:   Medicare IM given by:   Date Additional Medicare IM given:   Additional Medicare IM given by:    Discharge Disposition:  [place]  Per UR Regulation:  Reviewed for med. necessity/level of care/duration of stay  If discussed at [place] of Stay Meetings, dates discussed:   07/05/2014    Comments:  07/05/14 Aryella Besecker RN,BSN NCM [place], & D/C.NEB MACHINE, & 02 ORDERS PLACED-[facility] DME REP [place] DME TO RM.NO FURTHER D/C NEEDS.  07/04/14 Jakaria Lavergne RN,BSN NCM [place] DME CONFIRMED W/REP [facility] 02.PATIENT INQUIRING ABOUT TRAVEL TANK.PT-HH, DESATED-QUALIFIES FOR TRAVEL HOME 02 TANK-TC [facility] DME REP AWARE-WILL BRING TRAVEL 02 TANK TO RM @ D/C.HHPT NOT COVERED BY INSURANCE-WILL NEED HHRN SAFETY EVAL ORDERED-SEE PHYSICIAN STICY NOTE.[facility] CHOSEN FOR HHC-TC [facility] REP AWARE OF REFERRAL.WILL NEED HOME 02 LITER FLOW/FREQ/DURATION ORDER.  [id number] L. Rosana Hoes, RN, BSN,  CCM: Discharge needs reviewed at time of this chart review.

## 2014-07-05 MED ORDER — ALBUTEROL SULFATE (2.5 MG/3ML) 0.083% IN NEBU: 2.5000 mg | INHALATION_SOLUTION | RESPIRATORY_TRACT | Status: DC | PRN

## 2014-07-05 MED ORDER — PREDNISONE 5 MG PO TABS: ORAL_TABLET | ORAL | Status: DC

## 2014-07-05 MED ORDER — ALPRAZOLAM 0.5 MG PO TABS: 0.5000 mg | ORAL_TABLET | Freq: Two times a day (BID) | ORAL | Status: DC | PRN

## 2014-07-05 MED ORDER — TIOTROPIUM BROMIDE MONOHYDRATE 18 MCG IN CAPS: 18.0000 ug | ORAL_CAPSULE | Freq: Every day | RESPIRATORY_TRACT | Status: DC

## 2014-07-05 MED ORDER — MOMETASONE FURO-FORMOTEROL FUM 200-5 MCG/ACT IN AERO: 2.0000 | INHALATION_SPRAY | Freq: Two times a day (BID) | RESPIRATORY_TRACT | Status: DC

## 2014-07-05 MED ORDER — FUROSEMIDE 40 MG PO TABS
40.0000 mg | ORAL_TABLET | Freq: Once | ORAL | Status: AC
  Administered 2014-07-05: 40 mg via ORAL
  Filled 2014-07-05: qty 1

## 2014-07-05 MED ORDER — AZITHROMYCIN 250 MG PO TABS: 250.0000 mg | ORAL_TABLET | Freq: Every day | ORAL | Status: DC

## 2014-07-05 MED ORDER — FUROSEMIDE 10 MG/ML IJ SOLN
20.0000 mg | Freq: Once | INTRAMUSCULAR | Status: DC
  Filled 2014-07-05: qty 2

## 2014-07-05 MED ORDER — AZITHROMYCIN 250 MG PO TABS
250.0000 mg | ORAL_TABLET | Freq: Every day | ORAL | Status: DC
Start: 2014-07-05 — End: 2014-07-05
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
## 2014-07-05 NOTE — Progress Notes (Signed)
Patient d/c home,stable.- Joelle Flessner RN 

## 2014-07-05 NOTE — Progress Notes (Signed)
Patientd/c instructions rendered,I reviewed her home meds and also new meds that she will take at home,verbalized understanding. Prescriptions given. Denies pain. Stable for d/c.- Patient has oxygen supplies and also nebulizer machine,brought by [facility].Sandie Ano RN

## 2014-07-05 NOTE — Discharge Instructions (Signed)
Follow with Primary MD Dawayne Cirri, MD in 3 days   Get CBC, CMP, 2 view Chest X ray checked  by Primary MD next visit.    Activity: As tolerated with Full fall precautions use walker/cane & assistance as needed   Disposition Home    Diet: Heart Healthy   For Heart failure patients - Check your Weight same time everyday, if you gain over 2 pounds, or you develop in leg swelling, experience more shortness of breath or chest pain, call your Primary MD immediately. Follow Cardiac Low Salt Diet and 1.8 lit/day fluid restriction.   On your next visit with her primary care physician please Get Medicines reviewed and adjusted.  Please request your Prim.MD to go over all Hospital Tests and Procedure/Radiological results at the follow up, please get all Hospital records sent to your Prim MD by signing hospital release before you go home.   If you experience worsening of your admission symptoms, develop shortness of breath, life threatening emergency, suicidal or homicidal thoughts you must seek medical attention immediately by calling 911 or calling your MD immediately  if symptoms less severe.  You Must read complete instructions/literature along with all the possible adverse reactions/side effects for all the Medicines you take and that have been prescribed to you. Take any new Medicines after you have completely understood and accpet all the possible adverse reactions/side effects.   Do not drive, operating heavy machinery, perform activities at heights, swimming or participation in water activities or provide baby sitting services if your were admitted for syncope or siezures until you have seen by Primary MD or a Neurologist and advised to do so again.  Do not drive when taking Pain medications.    Do not take more than prescribed Pain, Sleep and Anxiety Medications  Special Instructions: If you have smoked or chewed Tobacco  in the last 2 yrs please stop smoking, stop any regular  Alcohol  and or any Recreational drug use.  Wear Seat belts while driving.   Please note  You were cared for by a hospitalist during your hospital stay. If you have any questions about your discharge medications or the care you received while you were in the hospital after you are discharged, you can call the unit and asked to speak with the hospitalist on call if the hospitalist that took care of you is not available. Once you are discharged, your primary care physician will handle any further medical issues. Please note that NO REFILLS for any discharge medications will be authorized once you are discharged, as it is imperative that you return to your primary care physician (or establish a relationship with a primary care physician if you do not have one) for your aftercare needs so that they can reassess your need for medications and monitor your lab values.

## 2014-07-11 ENCOUNTER — Inpatient Hospital Stay: Payer: Self-pay | Admitting: Adult Health

## 2014-07-14 ENCOUNTER — Encounter: Payer: Self-pay | Admitting: Adult Health

## 2014-07-14 ENCOUNTER — Ambulatory Visit (INDEPENDENT_AMBULATORY_CARE_PROVIDER_SITE_OTHER): Payer: Medicaid Other | Admitting: Adult Health

## 2014-07-14 VITALS — BP 120/74 | HR 98 | Temp 98.5°F | Ht 71.0 in | Wt 137.4 lb

## 2014-07-14 DIAGNOSIS — J449 Chronic obstructive pulmonary disease, unspecified: Secondary | ICD-10-CM

## 2014-07-14 MED ORDER — TIOTROPIUM BROMIDE MONOHYDRATE 18 MCG IN CAPS: 18.0000 ug | ORAL_CAPSULE | Freq: Every day | RESPIRATORY_TRACT | Status: DC

## 2014-07-14 NOTE — Assessment & Plan Note (Signed)
Recent exacerbation in the setting of ongoing smoking and polysubstance abuse Now improved  Plan  Continue on Dulera 2 puffs Twice daily  , rinse after use  Begin Spiriva 1 puff daily. Most important goal is to quit smoking. Continue on oxygen at 2 L. We will send order to your home care company for a smaller portable device. Absolutely no smoking around her oxygen devices or inside her home or car as  this is very dangerous follow up Dr. Melvyn Novas  In 6 weeks and As needed   Please contact office for sooner follow up if symptoms do not improve or worsen or seek emergency care

## 2014-07-14 NOTE — Addendum Note (Signed)
Addended by: Virl Cagey on: 07/14/2014 03:44 PM   Modules accepted: Orders

## 2014-07-14 NOTE — Patient Instructions (Signed)
Continue on Dulera 2 puffs Twice daily  , rinse after use  Begin Spiriva 1 puff daily. Most important goal is to quit smoking. Continue on oxygen at 2 L. We will send order to your home care company for a smaller portable device. Absolutely no smoking around her oxygen devices or inside her home or car as  this is very dangerous follow up Dr. Melvyn Novas  In 6 weeks and As needed   Please contact office for sooner follow up if symptoms do not improve or worsen or seek emergency care

## 2014-07-14 NOTE — Progress Notes (Signed)
Subjective:     Patient ID: Courtney Terry, female   DOB: [birth year] MRN: [id number]  Brief patient profile:  86 yobf smoker with new sob 2014 referred to pulmonary clinic 02/26/2013 for ? Copd by [facility]. Sees NP  [place].with GOLD IV copd by pft's 04/15/13   HPI 02/26/2013 1st pulmonary ov/ Wert Chief Complaint  Patient presents with  . Acute Visit    Pt c/o increased SOB for the past wk. She states gets SOB just walking short distances such as from lobby to exam room today and also sometimes gets SOB at rest. She has also noticed increased cough and wheezing. Cough is prod with moderate yellow to clear sputum.   indolent onset, variable sob activity and at rest x 3 months better on prednisone with apparent over use of saba at baseline with more day than night time symptoms. Transiently better p saba rec The key is to stop smoking completely before smoking completely stops you!  Prednisone 10 mg take  4 each am x 2 days,   2 each am x 2 days,  1 each am x2days and stop  Plan A= automatic = dulera 200 Take 2 puffs first thing in am and then another 2 puffs about 12 hours later.  Plan B = Backup = Only use your albuterol (red inhaler = proaire) as a rescue medication     04/15/2013 f/u ov/Wert re gold iv copd/ still smoking Chief Complaint  Patient presents with  . Followup with PFT    Pt states her breathing has improved slightly since the last visit. She c/o nose bleeds on and off for the past 3 wks.    doe x greater than slow walk or > 100 ft, less saba use daytime rec  key is to stop smoking completely before smoking completely stops you!  Work on inhaler technique:     05/28/2013 f/u ov/Wert copd G IV stop smoking around mid July Chief Complaint  Patient presents with  . COPD    Breathing has gotten worse since last OV. Has to been to the hospital. Reports her breathing is worse at night.  sob sitting still, worse lying down, x 50 ft  rec Plan A = automatic = dulera  200 2 puffs and Tudorza one puff repeat every 12 hours Plan B = backup = albuterol = proaire/ ventolin >  Only use  l as a rescue medication to be used if you can't catch your breath by resting or doing a relaxed purse lip breathing pattern. The less you use it, the better it will work when you need it. Ok to use up to 2 puffs every 4 hours Plan C = xanax (alprazolam) take 0.5 mg every 4 hours if can't get your breath after using Plan B first   06/03/13  Urine POS cocaine    08/12/2013 f/u ov/Wert still smoking  re: copd worse off tudorza  Chief Complaint  Patient presents with  . Follow-up    Pt states DOE worse for the past 3 wks. Gets SOB walking approx 100 ft. Ventolin twice daily for past few weeks  rec Plan A = automatic = dulera 200 2 puffs and Tudorza one puff repeat every 12 hours Plan B = backup = albuterol = proaire/ ventolin >  Only use  l as a rescue medication to be used if you can't catch your breath by resting or doing a relaxed purse lip breathing pattern. The less you use it, the better it will  work when you need it. Ok to use up to 2 puffs every 4 hours Plan C = xanax (alprazolam) take 0.5 mg every 4 hours if can't get your breath after using Plan B first  08/16/13 ER note admitted to ER Doctor use of crack cocaine   08/26/2013 f/u ov/Wert re: copd/ confused with meds, all inhalers on 0 Demonstrated read back last ov instructions but admitted not following  Chief Complaint  Patient presents with  . Follow-up    Pt c/o increased SOB since her last appt. She states she gets SOB walking short distances. She states has used rescue inhaler 3 times in the past wk.   wakes up with 02 on 4/7 ams with some cough >  mostly white mucus Not able to use dulera or tudorza first thing in am as rec rec Plan A = automatic = dulera 200 2 puffs and tudorza one puff first thing in am and repeat 12 hours later Plan B= backup = ventolin up to 4 puffs every 4 hours if needed For anxiety can  take the xanax up to every 4 hours if needed Please schedule a follow up office visit in 2 weeks> no showed   10/18/2013 f/u ov/Wert re: COPD ? GOLD IV Chief Complaint  Patient presents with  . Follow-up    Pt states her breathing is doing well today. Has not needed to use her rescue inhaler.   no tudorza in over a week No need for saba at all  Xanax works - "the best resp pill I ever got" >>  07/14/2014 [facility] follow up  Patient returns for a post hospital followup. Patient was admitted September 23 through September 29 for COPD, exacerbation, with acute hypoxic respiratory failure in the setting of polysubstance abuse. Patient was treated with IV antibiotics, steroids, and nebulized bronchodilators. She did require initial BiPAP support. With improved oxygenation. Patient was discharged on a steroid taper. She remains on [facility] Twice daily  . Oxygen 2l/m  Reports breathing is doing well since discharge.  Continues to smoke cigs and crack .   Requests rx for xanax explained to her that will not be able to prescribe narcotic medications.    Spiriva was added during admission  But was not started on this at discharge.  Patient denies any hemoptysis, orthopnea, abdominal pain, nausea, vomiting, diarrhea  Current Medications, Allergies, Past Medical History, Past Surgical History, Family History, and Social History were reviewed in [employer] record.  ROS  The following are not active complaints unless bolded sore throat, dysphagia, dental problems, itching, sneezing,  nasal congestion or excess/ purulent secretions, ear ache,   fever, chills, sweats, unintended wt loss, pleuritic or exertional cp, hemoptysis,  orthopnea pnd or leg swelling, presyncope, palpitations, heartburn, abdominal pain, anorexia, nausea, vomiting, diarrhea  or change in bowel or urinary habits, change in stools or urine, dysuria,hematuria,  rash, arthralgias, visual complaints, headache,  numbness weakness or ataxia or problems with walking or coordination,  change in mood/affect or memory.            Objective:   Physical Exam   04/15/2013     134  Vs 142  05/28/13 > 08/12/2013 144 > 08/26/2013  145 > 147  10/18/2013  >137 07/14/2014  amb anxious bf nad , anxious   HEENT mild turbinate edema.  Oropharynx no thrush or excess pnd or cobblestoning.  No JVD or cervical adenopathy. Mild accessory muscle hypertrophy. Trachea midline, nl thryroid. Chest was  hyperinflated by percussion with diminished breath sounds and moderate increased exp time w/ no wheezing . Hoover sign positive at mid inspiration. Regular rate and rhythm without murmur gallop or rub or increase P2 or edema.  Abd: no hsm, nl excursion. Ext warm without cyanosis or clubbing.          CXR 07/01/14 >COPD without evidence of pneumonia nor CHF or other acute  cardiopulmonary abnormality.

## 2014-08-25 ENCOUNTER — Encounter: Payer: Self-pay | Admitting: Adult Health

## 2014-08-25 ENCOUNTER — Ambulatory Visit (INDEPENDENT_AMBULATORY_CARE_PROVIDER_SITE_OTHER): Payer: Medicaid Other | Admitting: Adult Health

## 2014-08-25 DIAGNOSIS — J449 Chronic obstructive pulmonary disease, unspecified: Secondary | ICD-10-CM

## 2014-08-25 NOTE — Progress Notes (Signed)
Pt declined appt today with TP - asked to reschedule. Appt rescheduled to first available for pt on 09/05/2014 at 1:45pm Pt okay with this appt date/time Parke Poisson CMA

## 2014-08-26 NOTE — Assessment & Plan Note (Signed)
Rescheduled

## 2014-09-05 ENCOUNTER — Encounter: Payer: Self-pay | Admitting: Internal Medicine

## 2014-09-05 ENCOUNTER — Ambulatory Visit (INDEPENDENT_AMBULATORY_CARE_PROVIDER_SITE_OTHER): Payer: Medicaid Other | Admitting: Internal Medicine

## 2014-09-05 VITALS — BP 90/60 | HR 95 | Ht 70.0 in | Wt 142.0 lb

## 2014-09-05 DIAGNOSIS — J449 Chronic obstructive pulmonary disease, unspecified: Secondary | ICD-10-CM

## 2014-09-05 DIAGNOSIS — J9612 Chronic respiratory failure with hypercapnia: Secondary | ICD-10-CM

## 2014-09-05 DIAGNOSIS — F17200 Nicotine dependence, unspecified, uncomplicated: Secondary | ICD-10-CM

## 2014-09-05 DIAGNOSIS — Z720 Tobacco use: Secondary | ICD-10-CM

## 2014-09-05 NOTE — Assessment & Plan Note (Signed)

## 2014-09-05 NOTE — Assessment & Plan Note (Signed)
-   ono RA  06/11/13 <89  42 m and 28 s > rec 2lpm hs only  - HC03  35  07/03/14  - on 02 24/7 at 2lpm at present > Adequate control on present rx, reviewed > no change in rx needed

## 2014-09-05 NOTE — Progress Notes (Signed)
Subjective:     Patient ID: Courtney Terry, female   DOB: 03-Nov-1960 MRN: [id number]    Brief patient profile:  38 yobf smoker with new sob 2014 referred to pulmonary clinic 02/26/2013 for ? Copd by [facility]hospitalists. Sees NP  Donley Redder  with GOLD IV copd by pft's 04/15/13   HPI 02/26/2013 1st pulmonary ov/ Courtney Terry Chief Complaint  Patient presents with  . Acute Visit    Pt c/o increased SOB for the past wk. She states gets SOB just walking short distances such as from lobby to exam room today and also sometimes gets SOB at rest. She has also noticed increased cough and wheezing. Cough is prod with moderate yellow to clear sputum.   indolent onset, variable sob activity and at rest x 3 months better on prednisone with apparent over use of saba at baseline with more day than night time symptoms. Transiently better p saba rec The key is to stop smoking completely before smoking completely stops you!  Prednisone 10 mg take  4 each am x 2 days,   2 each am x 2 days,  1 each am x2days and stop  Plan A= automatic = dulera 200 Take 2 puffs first thing in am and then another 2 puffs about 12 hours later.  Plan B = Backup = Only use your albuterol (red inhaler = proaire) as a rescue medication     04/15/2013 f/u ov/Courtney Terry re gold iv copd/ still smoking Chief Complaint  Patient presents with  . Followup with PFT    Pt states her breathing has improved slightly since the last visit. She c/o nose bleeds on and off for the past 3 wks.    doe x greater than slow walk or > 100 ft, less saba use daytime rec  key is to stop smoking completely before smoking completely stops you!  Work on inhaler technique:     05/28/2013 f/u ov/Courtney Terry copd G IV stop smoking around mid July Chief Complaint  Patient presents with  . COPD    Breathing has gotten worse since last OV. Has to been to the hospital. Reports her breathing is worse at night.  sob sitting still, worse lying down, x 50 ft  rec Plan A = automatic =  dulera 200 2 puffs and Tudorza one puff repeat every 12 hours Plan B = backup = albuterol = proaire/ ventolin >  Only use  l as a rescue medication to be used if you can't catch your breath by resting or doing a relaxed purse lip breathing pattern. The less you use it, the better it will work when you need it. Ok to use up to 2 puffs every 4 hours Plan C = xanax (alprazolam) take 0.5 mg every 4 hours if can't get your breath after using Plan B first   06/03/13  Urine POS cocaine    08/12/2013 f/u ov/Courtney Terry still smoking  re: copd worse off tudorza  Chief Complaint  Patient presents with  . Follow-up    Pt states DOE worse for the past 3 wks. Gets SOB walking approx 100 ft. Ventolin twice daily for past few weeks  rec Plan A = automatic = dulera 200 2 puffs and Tudorza one puff repeat every 12 hours Plan B = backup = albuterol = proaire/ ventolin >  Only use  l as a rescue medication to be used if you can't catch your breath by resting or doing a relaxed purse lip breathing pattern. The less you use it,  the better it will work when you need it. Ok to use up to 2 puffs every 4 hours Plan C = xanax (alprazolam) take 0.5 mg every 4 hours if can't get your breath after using Plan B first  08/16/13 ER note admitted to ER Doctor use of crack cocaine   08/26/2013 f/u ov/Courtney Terry re: copd/ confused with meds, all inhalers on 0 Demonstrated read back last ov instructions but admitted not following  Chief Complaint  Patient presents with  . Follow-up    Pt c/o increased SOB since her last appt. She states she gets SOB walking short distances. She states has used rescue inhaler 3 times in the past wk.   wakes up with 02 on 4/7 ams with some cough >  mostly white mucus Not able to use dulera or tudorza first thing in am as rec rec Plan A = automatic = dulera 200 2 puffs and tudorza one puff first thing in am and repeat 12 hours later Plan B= backup = ventolin up to 4 puffs every 4 hours if needed For  anxiety can take the xanax up to every 4 hours if needed Please schedule a follow up office visit in 2 weeks> no showed      Admission date: 06/29/2014  Discharge Date: 07/05/2014   Discharge Diagnosis URI (upper respiratory infection) [465.9] Smoker [305.1] COPD exacerbation [491.21] Acute respiratory failure with hypoxia [518.81] COPD mixed type [496]   Principal Problem:  Acute respiratory failure Active Problems:  Cancer of upper-outer quadrant of female breast  COPD GOLD IV with reversibility/ still smoking  Smoker  COPD exacerbation  Hospital Course  1. Acute on chronic respiratory failure due to COPD exacerbation and URI. Much improved after azithromycin, IV steroids and initial BiPAP, now moving decent air and reduced wheezing, will be placed on oral steroid taper, qualified for home oxygen 2 L nasal cannula at all times, given prescriptions for nebulizer treatments and 5 more days of azithromycin. Will follow with her pulmonary physician Courtney Terry. 2. History of smoking and cocaine use last 2 days before admission. Counseled to quit. Avoid beta blocker. Also has reactive airway disease.  3. History of seizures. Continue Depakote.  4. Depression anxiety continue Abilify, Prozac and Xanax.  5. History of right-sided breast cancer status post lumpectomy. No acute issues outpatient followup.     07/14/2014 [facility] follow up  Patient returns for a post hospital followup. Patient was admitted September 23 through September 29 for COPD, exacerbation, with acute hypoxic respiratory failure in the setting of polysubstance abuse. Patient was treated with IV antibiotics, steroids, and nebulized bronchodilators. She did require initial BiPAP support. With improved oxygenation. Patient was discharged on a steroid taper. She remains on [facility] Twice daily  . Oxygen 2l/m  Reports breathing is doing well since discharge.  Continues to smoke cigs and crack .   Requests rx  for xanax explained to her that will not be able to prescribe narcotic medications.  Spiriva was added during admission  But was not started on this at discharge.  rec Continue on Dulera 2 puffs Twice daily  , rinse after use  Begin Spiriva 1 puff daily. Most important goal is to quit smoking. Continue on oxygen at 2 L. We will send order to your home care company for a smaller portable device.   09/05/2014 f/u ov/Courtney Terry re: GOLD IV COPD/ still smoking/ denies cocaine Chief Complaint  Patient presents with  . Follow-up    Pt states her  breathing is unchanged. She states sometimes wakes up in the night and panics b/c her nasal cannula has fallen off. She is using rescue inhaler 3 x per wk on average.     Requesting xanax from this clinic says denies by behavior health but "the abilify not helping"  ? If takes it - off xanax the month of November and no worse off it.   No obvious day to day or daytime variabilty or assoc chronic cough/ ecess or purulent secretions or cp or chest tightness, subjective wheeze overt sinus or hb symptoms. No unusual exp hx or h/o childhood pna/ asthma or knowledge of premature birth.  Sleeping ok without nocturnal  or early am exacerbation  of respiratory  c/o's or need for noct saba. Also denies any obvious fluctuation of symptoms with weather or environmental changes or other aggravating or alleviating factors except as outlined above   Current Medications, Allergies, Complete Past Medical History, Past Surgical History, Family History, and Social History were reviewed in [employer] record.  ROS  The following are not active complaints unless bolded sore throat, dysphagia, dental problems, itching, sneezing,  nasal congestion or excess/ purulent secretions, ear ache,   fever, chills, sweats, unintended wt loss, pleuritic or exertional cp, hemoptysis,  orthopnea pnd or leg swelling, presyncope, palpitations, heartburn, abdominal pain,  anorexia, nausea, vomiting, diarrhea  or change in bowel or urinary habits, change in stools or urine, dysuria,hematuria,  rash, arthralgias, visual complaints, headache, numbness weakness or ataxia or problems with walking or coordination,  change in mood/affect or memory.                Objective:   Physical Exam   04/15/2013   134  Vs 142  05/28/13 > 08/12/2013 144 > 08/26/2013  145 > 147  10/18/2013  >137 07/14/2014 > 09/05/2014 142   amb anxious bf nad , anxious    HEENT mild turbinate edema.  Oropharynx no thrush or excess pnd or cobblestoning.  No JVD or cervical adenopathy. Mild accessory muscle hypertrophy. Trachea midline, nl thryroid. Chest was hyperinflated by percussion with diminished breath sounds and moderate increased exp time w/ no wheezing . Hoover sign positive at mid inspiration. Regular rate and rhythm without murmur gallop or rub or increase P2 or edema.  Abd: no hsm, nl excursion. Ext warm without cyanosis or clubbing.          CXR 07/01/14 >COPD without evidence of pneumonia nor CHF or other acute  cardiopulmonary abnormality.

## 2014-09-05 NOTE — Patient Instructions (Signed)
Walking more regularly while  on 02 is the best way to maintain your conditioning and deal with your nerves but you need to learn to pace yourself   There is no  question that your anxiety is playing big role in your breathing which needs to stay relaxed and your xanax if used at all longterm  should be adjusted by a nerve specialist which is same source as your abilify prescription (we cannot be precribing half your nerve pills here and half somewhere else since we are not nerve specialists)   If you are satisfied with your treatment plan,  let your doctor know and he/she can either refill your medications or you can return here when your prescription runs out.     If in any way you are not 100% satisfied,  please tell us.  If 100% better, tell your friends!  Pulmonary follow up is as needed

## 2014-09-05 NOTE — Assessment & Plan Note (Signed)
-   PFT's 04/15/13 FEV1  0.56 (18%) ratio 37 and 27% better p B2  DLCO 39 corrects to 60 - added tudorza one bid 05/28/2013 > stopped 10/07/13 no change  - 05/28/2013   Walked RA x one lap @ 185 stopped due to  Sob no tachypnea with sat 93%  - 08/12/2013  Walked RA x 3 laps @ 185 ft each stopped due to end of study sats 88% - 08/26/2013   Walked RA x one lap @ 185 stopped due to sob with sat 97%  - 10/18/2013 p extensive coaching HFA effectiveness =    75% from baseline of < 25% - spirometry 10/18/2013   0.99 (36%) ratio 40  - Alpha one genotype 10/18/2013 >>> did not go to lab  Adherence is always the initial "prime suspect" and is a multilayered concern that requires a "trust but verify" approach in every patient - starting with knowing how to use medications, especially inhalers, correctly, keeping up with refills and understanding the fundamental difference between maintenance and prns vs those medications only taken for a very short course and then stopped and not refilled.   rec continue dulera 200 2bid and spiriva  Active smoking other big concern > discussed separately  Anxiety also near top of list > referred back to behavioral health.  Nothing else to offer in this clinic   Each maintenance medication was reviewed in detail including most importantly the difference between maintenance and as needed and under what circumstances the prns are to be used.  Please see instructions for details which were reviewed in writing and the patient given a copy.

## 2014-09-21 ENCOUNTER — Other Ambulatory Visit: Payer: Self-pay | Admitting: Adult Health

## 2014-09-21 ENCOUNTER — Other Ambulatory Visit: Payer: Self-pay | Admitting: Internal Medicine

## 2014-10-25 ENCOUNTER — Other Ambulatory Visit: Payer: Self-pay | Admitting: Internal Medicine

## 2014-12-08 ENCOUNTER — Other Ambulatory Visit: Payer: Self-pay | Admitting: Nurse Practitioner

## 2014-12-08 DIAGNOSIS — Z853 Personal history of malignant neoplasm of breast: Secondary | ICD-10-CM

## 2014-12-08 DIAGNOSIS — N63 Unspecified lump in unspecified breast: Secondary | ICD-10-CM

## 2014-12-15 ENCOUNTER — Ambulatory Visit
Admission: RE | Admit: 2014-12-15 | Discharge: 2014-12-15 | Disposition: A | Discharge status: Home / Self Care | Payer: Medicaid Other | Source: Ambulatory Visit | Attending: Nurse Practitioner | Admitting: Nurse Practitioner

## 2014-12-15 ENCOUNTER — Other Ambulatory Visit: Payer: Self-pay | Admitting: Nurse Practitioner

## 2014-12-15 DIAGNOSIS — Z853 Personal history of malignant neoplasm of breast: Secondary | ICD-10-CM

## 2014-12-15 DIAGNOSIS — N63 Unspecified lump in unspecified breast: Secondary | ICD-10-CM

## 2014-12-15 DIAGNOSIS — N6320 Unspecified lump in the left breast, unspecified quadrant: Secondary | ICD-10-CM

## 2014-12-19 ENCOUNTER — Other Ambulatory Visit: Payer: Self-pay

## 2014-12-20 ENCOUNTER — Other Ambulatory Visit: Payer: Self-pay

## 2014-12-23 ENCOUNTER — Other Ambulatory Visit: Payer: Self-pay

## 2014-12-28 ENCOUNTER — Ambulatory Visit
Admission: RE | Admit: 2014-12-28 | Discharge: 2014-12-28 | Disposition: A | Discharge status: Home / Self Care | Payer: Medicaid Other | Source: Ambulatory Visit | Attending: Nurse Practitioner | Admitting: Nurse Practitioner

## 2014-12-28 DIAGNOSIS — N63 Unspecified lump in unspecified breast: Secondary | ICD-10-CM

## 2014-12-28 DIAGNOSIS — Z853 Personal history of malignant neoplasm of breast: Secondary | ICD-10-CM

## 2015-01-13 ENCOUNTER — Ambulatory Visit: Payer: Self-pay | Admitting: Surgery

## 2015-01-13 ENCOUNTER — Other Ambulatory Visit: Payer: Self-pay | Admitting: Surgery

## 2015-01-13 DIAGNOSIS — C50911 Malignant neoplasm of unspecified site of right female breast: Secondary | ICD-10-CM

## 2015-01-16 ENCOUNTER — Telehealth: Payer: Self-pay | Admitting: *Deleted

## 2015-01-16 NOTE — Telephone Encounter (Signed)
Received referral from [place].  Called pt and left a message for her to return my call so I can schedule her w/ a new Med Onc since Dr. Humphrey Rolls is no longer here.

## 2015-01-20 ENCOUNTER — Telehealth: Payer: Self-pay | Admitting: *Deleted

## 2015-01-20 ENCOUNTER — Encounter: Payer: Self-pay | Admitting: Radiation Oncology

## 2015-01-20 NOTE — Telephone Encounter (Signed)
Left message for Courtney Terry to call me back to see if I can schedule an appt w/ her for pt.

## 2015-01-20 NOTE — Telephone Encounter (Signed)
Called Courtney Terry, requesting she call for patient right lumpectomy path report and Surgeons's notes from 2013, 01/2012 and 03/06/12 10:47 AM

## 2015-01-20 NOTE — Progress Notes (Signed)
Location of Breast Cancer:Right Breast 10 O'clock  Upper outer quadrant  Histology per Pathology Report: Diagnosis3/23/16: Breast, right, needle core biopsy, 10 o'clock, 2 cm from the nipple- INVASIVE DUCTAL CARCINOMA   Receptor Status: ER( + 100%  ), PR ( nef 0%  ), Her2-neu (neg )  Did patient present with symptoms (if so, please note symptoms) or was this found on screening mammography?: patient noticed mass herself a few months ago,   Past/Anticipated interventions by surgeon, if any:03/06/12 Right Lumpectomy  With needle localization and axillary sentinel lymph node bx Dr. Erroll Luna, MD,requested path report  Past/Anticipated interventions by medical oncology, if any: Chemotherapy   Lymphedema issues, if any:    Pain issues, if any:    SAFETY ISSUES:  Prior radiation? NO  Pacemaker/ICD? no  Possible current pregnancy? No  Is the patient on methotrexate? NO  Current Complaints / other details: menses age 54, 21, menopause <46,Depression/anxiety, Home Oxygen use,   History right Breast Cancer 4/ 2013 and had Right  lumpectomy 03/06/12  And SLN mapping lost to folLow up  Current cigarette smoker, occasinal alcohol use, recently quit drug use, intermittent incarcination Sister cancer, brother colon cancer, brother prostate cancer, Mother heart disease  Allergies: NKDA Rebecca Eaton, RN 01/20/2015,10:28 AM

## 2015-01-23 ENCOUNTER — Ambulatory Visit: Payer: Medicaid Other

## 2015-01-23 ENCOUNTER — Telehealth: Payer: Self-pay | Admitting: *Deleted

## 2015-01-23 ENCOUNTER — Telehealth: Payer: Self-pay | Admitting: Oncology

## 2015-01-23 ENCOUNTER — Ambulatory Visit
Admission: RE | Admit: 2015-01-23 | Discharge: 2015-01-23 | Disposition: A | Discharge status: Home / Self Care | Payer: Medicaid Other | Source: Ambulatory Visit | Attending: Radiation Oncology | Admitting: Radiation Oncology

## 2015-01-23 DIAGNOSIS — C50411 Malignant neoplasm of upper-outer quadrant of right female breast: Secondary | ICD-10-CM

## 2015-01-23 NOTE — Telephone Encounter (Signed)
Lucrezia Europe regarding her consult appointment with Dr. Lisbeth Renshaw today.  Per Katharine Look, she had called to cancel.

## 2015-01-23 NOTE — Telephone Encounter (Signed)
Received appt from Dr. Burr Medico.  Called pt and confirmed 01/26/15 appt w/ her.  Unable to mail welcoming packet - gave directions and instructions.  Unable to mail intake form - placed a note for one to be given at time of check in.  Emailed Engineer, civil (consulting) at [employer] to make her aware.  Placed a copy of records in Dr. Ernestina Penna box and took one to HIM to scan.

## 2015-01-26 ENCOUNTER — Telehealth: Payer: Self-pay | Admitting: Hematology

## 2015-01-26 ENCOUNTER — Encounter: Payer: Self-pay | Admitting: Hematology

## 2015-01-26 ENCOUNTER — Ambulatory Visit: Payer: Medicaid Other

## 2015-01-26 ENCOUNTER — Ambulatory Visit (HOSPITAL_BASED_OUTPATIENT_CLINIC_OR_DEPARTMENT_OTHER): Payer: Medicaid Other | Admitting: Hematology

## 2015-01-26 VITALS — BP 98/78 | HR 77 | Temp 97.9°F | Resp 20 | Ht 70.0 in | Wt 116.2 lb

## 2015-01-26 DIAGNOSIS — C50411 Malignant neoplasm of upper-outer quadrant of right female breast: Secondary | ICD-10-CM

## 2015-01-26 DIAGNOSIS — Z853 Personal history of malignant neoplasm of breast: Secondary | ICD-10-CM | POA: Diagnosis not present

## 2015-01-26 DIAGNOSIS — Z170 Estrogen receptor positive status [ER+]: Secondary | ICD-10-CM
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
## 2015-01-26 NOTE — Telephone Encounter (Signed)
gave and printed appt sched and avs fo rpt for May °

## 2015-01-26 NOTE — Progress Notes (Signed)
Checked in new pt with no financial concerns at this time.  Pt's insurance should pay at 100% but she has my card for any billing questions or concerns. ° °

## 2015-01-27 ENCOUNTER — Encounter: Payer: Self-pay | Admitting: *Deleted

## 2015-01-27 ENCOUNTER — Encounter: Payer: Self-pay | Admitting: Hematology

## 2015-01-28 ENCOUNTER — Other Ambulatory Visit: Payer: Self-pay | Admitting: Internal Medicine

## 2015-02-05 HISTORY — PX: BREAST BIOPSY: SHX20

## 2015-02-06 ENCOUNTER — Ambulatory Visit: Payer: Self-pay | Admitting: Surgery

## 2015-02-06 NOTE — H&P (Signed)
Courtney Terry 02/06/2015 2:23 PM Location: [facility] Patient #: [id number] DOB: Sep 23, 1961 Widowed / Language: Courtney Terry / Race: Black or African American Female History of Present Illness Courtney Terry A. Tanna Loeffler MD; 02/06/2015 2:37 PM) Patient words: discuss breast surgery   pt is here for follow up of recurrent right breast cancer. she hs seen Dr Iran Planas for consideration of NSM. She continuesto smoke and understands she may loose the nipple and increase her complication risk. No new complaints.  The patient is a 54 year old female   Problem List/Past Medical Courtney Terry, [place]; 02/06/2015 2:24 PM) RECURRENT BREAST CANCER, RIGHT (174.9  C50.911)  Other Problems Courtney Terry, CMA; 02/06/2015 2:24 PM) Anxiety Disorder Asthma Breast Cancer Chronic Obstructive Lung Disease Depression Home Oxygen Use Lump In Breast Thyroid Disease  Past Surgical History Courtney Terry, Courtney Terry; 02/06/2015 2:25 PM) Oral Surgery  Diagnostic Studies History Courtney Terry, [place]; 02/06/2015 2:25 PM) Colonoscopy never Mammogram within last year  Allergies Courtney Terry, CMA; 02/06/2015 2:25 PM) No Known Drug Allergies 01/13/2015  Medication History Courtney Terry, CMA; 02/06/2015 2:25 PM) Ciprofloxacin HCl (500MG  Tablet, Oral) Active. Dulera (200-5MCG/ACT Aerosol, Inhalation) Active. Meloxicam (15MG  Tablet, Oral) Active. ProAir HFA (108 (90 Base)MCG/ACT Aerosol Soln, Inhalation) Active. ProAir RespiClick (992 (90 Base)MCG/ACT Aero Pow Br Act, Inhalation) Active. Divalproex Sodium (500MG  Tablet DR, Oral) Active. Abilify (2MG  Tablet, Oral) Active. Depakote (500MG  Tablet DR, Oral) Active. Tiotropium Bromide Monohydrate (18MCG Capsule, Inhalation) Active.  Social History Courtney Terry, [place]; 02/06/2015 2:25 PM) Alcohol use Occasional alcohol use. Caffeine use Carbonated beverages. Illicit drug use Recently quit drug use. Tobacco use Current every  day smoker.  Family History Courtney Terry, [place]; 02/06/2015 2:25 PM) Cancer Sister. Colon Cancer Brother. Heart Disease Mother. Migraine Headache Family Members In General. Prostate Cancer Brother. Seizure disorder Family Members In General.  Pregnancy / Birth History Courtney Terry, [place]; 02/06/2015 2:25 PM) Age at menarche 75 years. Age of menopause <46 Gravida 0 Irregular periods Para 0    Vitals (Courtney Terry CMA; 02/06/2015 2:24 PM) 02/06/2015 2:23 PM Weight: 114.25 lb Height: 69in Body Surface Area: 1.59 m Body Mass Index: 16.87 kg/m BP: 110/72 (Sitting, Left Arm, Standard)     Physical Exam (Antanette Richwine A. Jaizon Deroos MD; 02/06/2015 2:42 PM)  The physical exam findings are as follows: Note:pt not reexamined  General Mental Status-Alert. General Appearance-Consistent with stated age. Hydration-Well hydrated. Voice-Normal.    Assessment & Plan ([name] A. Karis Emig MD; 02/06/2015 2:39 PM)  RECURRENT BREAST CANCER, RIGHT (174.9  C50.911) Impression: discussed options of repeat lumpectomy on the right, mastectomy and nipple sparing mastectomy with reconstruction. Pt is not compliant so I explained that lumpectomy needs to be followed with radiation therapy. She likes the idea of NSM. She has seen plastics and smoking history and active use an issue. I explained that nipple los is higher in smokers. She is willing to stop smoking and understands she may loose the nipple if she does. Discussed treatment options for breast cancer to include breast conservation vs mastectomy with reconstruction. Pt has decided on mastectomy. Risk include bleeding, infection, flap necrosis, pain, numbness, recurrence, hematoma, other surgery needs. Pt understands and agrees to proceed. will coordinate with Dr Iran Planas

## 2015-02-06 NOTE — H&P (Signed)
Courtney Terry 01/13/2015 9:46 AM Location: [facility] Patient #: [id number] DOB: [birth year] Widowed / Language: [name] / Race: Black or African American Female  History of Present Illness [name] A. Meli Faley MD; 01/13/2015 10:48 AM) Patient words: breast cancer right pt presents after noticing a mass in her right breast a few months ago. has a history of stage 1 right breast cancer in 2013 and underwent right breast lumpectomy and SLN mapping but was lost to follow up. she did not receive radiation therapy and has been intermittently incarcerated. She is usually noncompliant with her care. She denies nipple discharge and site is sore.   CLINICAL DATA: 54 year old female status post ultrasound-guided right breast biopsy  EXAM: DIAGNOSTIC RIGHT MAMMOGRAM POST ULTRASOUND BIOPSY  COMPARISON: Previous exam(s).  FINDINGS: Mammographic images were obtained following ultrasound guided biopsy of a right breast mass at 10 o'clock, 2 cm from the nipple. Post biopsy mammogram confirms satisfactory placement of a ribbon clip within the palpable right breast mass.  IMPRESSION: Satisfactory placement of a ribbon clip post ultrasound-guided biopsy.  Final Assessment: Post Procedure Mammograms for Marker Placement   CLINICAL DATA: Small, enlarging mass felt by the patient in the upper outer right breast for the past 2 months. Status post right lumpectomy for breast cancer in 2013. The palpable mass is near the lumpectomy site. EXAM: DIGITAL DIAGNOSTIC BILATERAL MAMMOGRAM WITH 3D TOMOSYNTHESIS WITH CAD ULTRASOUND BILATERAL BREAST COMPARISON: Previous examinations. ACR Breast Density Category b: There are scattered areas of fibroglandular density. FINDINGS: Interval postlumpectomy changes with surgical clips in the upper outer right breast, posteriorly. Anterior to the lumpectomy site, there is a small, oval mass containing tiny, faint, punctate microcalcifications,  corresponding to the mass felt by the patient. Between at mass in the nipple, there is a second, smaller, oval mass. There is also a tiny, oval, circumscribed mass in the medial left breast. Mammographic images were processed with CAD. On physical exam, the patient has an approximately 8 mm rounded, palpable mass in the 10 o'clock position of the right breast, 2 cm from the nipple. No additional masses are palpable in either breast. There are no palpable right axillary lymph nodes. Targeted ultrasound is performed, showing a 9 x 8 x 6 mm oval, heterogeneous mass with poorly defined margins in the 10 o'clock position of the right breast, 2 cm from the nipple. This has an ill-defined peripheral echogenic component and an irregular central hypoechoic component. No definite internal blood flow was seen with color Doppler. Also demonstrated was a 6 x 4 x 3 mm near anechoic oval, circumscribed mass with an eccentric echogenic component in the 10 o'clock position of the right breast, 1 cm from the nipple. Normal appearing right axillary lymph nodes are demonstrated. In the 9 o'clock position of the left breast, 1 cm from the nipple, a 3 mm cyst was demonstrated. IMPRESSION: 1. 9 mm palpable mass in the 10 o'clock position of the right breast, 2 cm from the nipple. This has nonspecific imaging features and could represent a small area of recurrent malignancy or area of fat necrosis. 2. 6 mm intramammary lymph node or cyst in the 10 o'clock position of the right breast, 1 cm from the nipple. 3. 3 mm 9 o'clock left breast cyst. 4. No right axillary adenopathy. RECOMMENDATION: Ultrasound-guided core needle biopsy of the 9 mm mass in the 10 o'clock position of the right breast, 2 cm from the nipple. This has been discussed with the patient and scheduled for 3 p.m. on  12/20/2014. I have discussed the findings and recommendations with the patient. Results were also provided in writing at the conclusion of the  visit. If applicable, a reminder letter will be sent to the patient regarding the next appointment. BI-RADS CATEGORY 4: Suspicious. Electronically Signed By: Claudie Revering M.D. On: 12/15/2014 16:18   External Result Report       Patient: Courtney Terry, Courtney Terry Collected: 12/28/2014 Client: [facility] Accession: [id number] Received: 12/28/2014 Pamelia Hoit, MD DOB: January 20, 1961 Age: 5 Gender: F Reported: 12/29/2014 [place] Patient Ph: [phone] MRN #: [id number] [place], [place] [address] Client Acc#: Chart #: [id number] Phone: [phone] Fax: CC: [facility] CC CC: MAVIS Yehuda Mao, MD REPORT OF SURGICAL PATHOLOGY ADDITIONAL INFORMATION: PROGNOSTIC INDICATORS - ACIS Results: IMMUNOHISTOCHEMICAL AND MORPHOMETRIC ANALYSIS BY THE AUTOMATED CELLULAR IMAGING SYSTEM (ACIS) Estrogen Receptor: 100%, POSITIVE, STRONG STAINING INTENSITY Progesterone Receptor: 0%, NEGATIVE Proliferation Marker Ki67: 43% COMMENT: The negative hormone receptor study(ies) in this case has no internal positive control. REFERENCE RANGE ESTROGEN RECEPTOR NEGATIVE <1% POSITIVE =>1% PROGESTERONE RECEPTOR NEGATIVE <1% POSITIVE =>1% All controls stained appropriately Enid Cutter MD Pathologist, Electronic Signature ( Signed 01/04/2015) CHROMOGENIC IN-SITU HYBRIDIZATION Results: HER-2/NEU BY CISH - NEGATIVE. RESULT RATIO OF HER2: CEP 17 SIGNALS 1.02 AVERAGE HER2 COPY NUMBER PER CELL 2.10 1 of 3 FINAL for Botting, Hena ([id number]) ADDITIONAL INFORMATION:(continued) REFERENCE RANGE NEGATIVE HER2/Chr17 Ratio <2.0 and Average HER2 copy number <4.0 EQUIVOCAL HER2/Chr17 Ratio <2.0 and Average HER2 copy number 4.0 and <6.0 POSITIVE HER2/Chr17 Ratio >=2.0 and/or Average HER2 copy number >=6.0 Enid Cutter MD Pathologist, Electronic Signature ( Signed 01/03/2015) FINAL DIAGNOSIS Diagnosis Breast, right, needle core biopsy, 10 o'clock, 2 cm from the nipple - INVASIVE DUCTAL  CARCINOMA. - SEE COMMENT. Microscopic Comment Although definitive grading of breast carcinoma is best done on excision, the features of the invasive tumor from the right 10 o'clock, 2 cm from the nipple biopsy, are compatible with a grade II-III breast carcinoma. Breast prognostic markers will be performed and reported in an addendum. Findings are called to the [facility] on 12/29/2014. Dr. Lyndon Code has seen this case in consultation with agreement. (RH:ecj 12/29/2014) Willeen Niece MD Pathologist, Electronic Signature (Case signed 12/29/2014) Specimen Gross and Clinical Information Specimen Comment In formalin 1:45 extracted < 1 min; HX of right lumpectomy, new palpable mass Specimen(s) Obtained: Breast, right, needle core biopsy, 10 o'clock, 2 cm from the nipple Specimen Clinical Information Fat necrosis vs. IDC Gross Received in formalin are 3 cores of soft, tan-red tissue which measure0.4 x 0.3 x 0.2 cm and up to 1.5 x 0.3 x 0.2 cm. Submitted in toto in 1 block(s) Time in Formalin 1:45 am Stain(s) used in Diagnosis: The following stain(s) were used in diagnosing the case: ER-ACIS, PR-ACIS, CISH, KI-67-ACIS. The control(s) stained appropriately. Disclaimer Her2Neu by CISH (chromogenic in-situ hybridization) is performed at [facility], using the [place] pharmDx Kit (code number [id number]). This test is used to detect the amplification of the 2 of 3 FINAL for Courtney Terry, Courtney Terry ([id number]) Disclaimer(continued) Her2Neu gene in interphase nuclei from formalin fixed, paraffin embedded tissue and is reported using ASCO/CAP scoring criteria.  The patient is a 54 year old female    Other Problems Elbert Ewings, CMA; 01/13/2015 9:46 AM) Anxiety Disorder Asthma Breast Cancer Chronic Obstructive Lung Disease Depression Home Oxygen Use Lump In Breast Thyroid Disease  Past Surgical History Elbert Ewings, CMA; 01/13/2015 9:46 AM) Oral Surgery  Diagnostic  Studies History Elbert Ewings, CMA; 01/13/2015 9:46 AM) Colonoscopy never  Mammogram within last year  Allergies Elbert Ewings, CMA; 01/13/2015 9:47 AM) No Known Drug Allergies04/05/2015  Medication History Elbert Ewings, CMA; 01/13/2015 9:49 AM) Ciprofloxacin HCl (500MG Tablet, Oral) Active. Dulera (200-5MCG/ACT Aerosol, Inhalation) Active. Meloxicam (15MG Tablet, Oral) Active. ProAir HFA (108 (90 Base)MCG/ACT Aerosol Soln, Inhalation) Active. ProAir RespiClick (557 (90 Base)MCG/ACT Aero Pow Br Act, Inhalation) Active. Divalproex Sodium (500MG Tablet DR, Oral) Active. Abilify (2MG Tablet, Oral) Active. Depakote (500MG Tablet DR, Oral) Active. Tiotropium Bromide Monohydrate (18MCG Capsule, Inhalation) Active. Medications Reconciled  Social History Elbert Ewings, [place]; 01/13/2015 9:46 AM) Alcohol use Occasional alcohol use. Caffeine use Carbonated beverages. Illicit drug use Recently quit drug use. Tobacco use Current every day smoker.  Family History Elbert Ewings, [place]; 01/13/2015 9:46 AM) Cancer Sister. Colon Cancer Brother. Heart Disease Mother. Migraine Headache Family Members In General. Prostate Cancer Brother. Seizure disorder Family Members In General.  Pregnancy / Birth History Elbert Ewings, [place]; 01/13/2015 9:46 AM) Age at menarche 73 years. Age of menopause <46 Gravida 0 Irregular periods Para 0  Review of Systems Elbert Ewings CMA; 01/13/2015 9:46 AM) General Present- Weight Loss. Not Present- Appetite Loss, Chills, Fatigue, Fever, Night Sweats and Weight Gain. Skin Present- Rash. Not Present- Change in Wart/Mole, Dryness, Hives, Jaundice, New Lesions, Non-Healing Wounds and Ulcer. HEENT Present- Hearing Loss. Not Present- Earache, Hoarseness, Nose Bleed, Oral Ulcers, Ringing in the Ears, Seasonal Allergies, Sinus Pain, Sore Throat, Visual Disturbances, Wears glasses/contact lenses and Yellow Eyes. Breast Present- Breast Mass and Breast Pain. Not Present-  Nipple Discharge and Skin Changes. Cardiovascular Not Present- Chest Pain, Difficulty Breathing Lying Down, Leg Cramps, Palpitations, Rapid Heart Rate, Shortness of Breath and Swelling of Extremities. Gastrointestinal Not Present- Abdominal Pain, Bloating, Bloody Stool, Change in Bowel Habits, Chronic diarrhea, Constipation, Difficulty Swallowing, Excessive gas, Gets full quickly at meals, Hemorrhoids, Indigestion, Nausea, Rectal Pain and Vomiting. Female Genitourinary Present- Nocturia. Not Present- Frequency, Painful Urination, Pelvic Pain and Urgency. Musculoskeletal Present- Muscle Weakness. Not Present- Back Pain, Joint Pain, Joint Stiffness, Muscle Pain and Swelling of Extremities. Neurological Present- Seizures. Not Present- Decreased Memory, Fainting, Headaches, Numbness, Tingling, Tremor, Trouble walking and Weakness. Psychiatric Present- Anxiety and Depression. Not Present- Bipolar, Change in Sleep Pattern, Fearful and Frequent crying. Endocrine Present- Excessive Hunger. Not Present- Cold Intolerance, Hair Changes, Heat Intolerance, Hot flashes and New Diabetes. Hematology Present- Easy Bruising and Excessive bleeding. Not Present- Gland problems, HIV and Persistent Infections.   Vitals Elbert Ewings CMA; 01/13/2015 9:49 AM) 01/13/2015 9:49 AM Weight: 123 lb Height: 69in Body Surface Area: 1.65 m Body Mass Index: 18.16 kg/m Temp.: 98.27F(Oral)  Pulse: 102 (Regular)  Resp.: 18 (Unlabored)  BP: 136/72 (Sitting, Left Arm, Standard)    Physical Exam (Dorrance Sellick A. Dawaun Brancato MD; 01/13/2015 10:49 AM) General Mental Status-Alert. General Appearance-Consistent with stated age. Hydration-Well hydrated. Voice-Normal.  Head and Neck Head-normocephalic, atraumatic with no lesions or palpable masses. Trachea-midline. Thyroid Gland Characteristics - normal size and consistency.  Eye Eyeball - Bilateral-Extraocular movements intact. Sclera/Conjunctiva - Bilateral-No  scleral icterus.  Chest and Lung Exam Chest and lung exam reveals -quiet, even and easy respiratory effort with no use of accessory muscles and on auscultation, normal breath sounds, no adventitious sounds and normal vocal resonance. Inspection Chest Wall - Normal. Back - normal.  Breast Note: 1 cm mobile nodule    Cardiovascular Cardiovascular examination reveals -normal heart sounds, regular rate and rhythm with no murmurs and normal pedal pulses bilaterally.  Neurologic Neurologic evaluation reveals -alert and oriented x 3 with no impairment of  recent or remote memory. Mental Status-Normal.  Musculoskeletal Normal Exam - Left-Upper Extremity Strength Normal and Lower Extremity Strength Normal. Normal Exam - Right-Upper Extremity Strength Normal and Lower Extremity Strength Normal.  Lymphatic Axillary  General Axillary Region: Bilateral - Description - Normal. Tenderness - Non Tender.    Assessment & Plan (Tashona Calk A. Eman Morimoto MD; 01/13/2015 10:51 AM) RECURRENT BREAST CANCER, RIGHT (174.9  C50.911) Impression: discussed options of repeat lumpectomy on the right, mastectomy and nipple sparing mastectomy with reconstruction. Pt is not compliant so I explained that lumpectomy needs to be followed with radiation therapy. She likes the idea of NSM so I will refer to plastic surgery for opinion. Pt is to return after to discuss further. Current Plans  Pt Education - [facility] breast cancer surgery: discussed with patient and provided information. Pt Education - [facility] - breast cancer surgery- under construction 2-11

## 2015-02-13 ENCOUNTER — Encounter: Payer: Self-pay | Admitting: *Deleted

## 2015-02-14 ENCOUNTER — Telehealth: Payer: Self-pay | Admitting: Hematology

## 2015-02-14 ENCOUNTER — Ambulatory Visit: Payer: Self-pay | Admitting: Surgery

## 2015-02-14 NOTE — Telephone Encounter (Signed)
per pof to sch pt appt-per Dawn to r/s from May due to surgery-Cls & left pt a message of r/s appt time & date

## 2015-02-15 ENCOUNTER — Encounter: Payer: Self-pay | Admitting: Skilled Nursing Facility1

## 2015-02-15 NOTE — Progress Notes (Signed)
Subjective:     Patient ID: Courtney Terry, female   DOB: [birth year], 54 y.o.   MRN: [id number]  HPI   Review of Systems     Objective:   Physical Exam To help the pt identify some dietary strategies to gain some lost wt back.    Assessment:     Pt identified as being malnourished due to lost wt. Pt contacted via [phone]. Pt states she lost about 15 pounds and currently weights about 120 pounds. Pt states she was losing wt without her thyroid medication but once she got back on it she gained some wt back and had more of an appetite. Pt states her apatite is all right now.     Plan:     Dietitian suggested looking into boost for days when her appetite is low or if she continues to lose wt. Dietitian also advised she be as physically active as possible to try to increase her appetite. Dietitian also suggested she keep foods she always has an appetite for in the house so they are readily available.

## 2015-03-02 ENCOUNTER — Ambulatory Visit: Payer: Self-pay | Admitting: Hematology

## 2015-03-02 ENCOUNTER — Other Ambulatory Visit: Payer: Self-pay

## 2015-03-03 NOTE — H&P (Signed)
  Subjective:    Patient ID: Courtney Terry is a 54 y.o. female.  HPI  Patient of Dr. Brantley Stage with recurrent right breast cancer. Represented with palpable mass that has been growing for 6 months. MMG/US 9 mm palpable mass in the 10 o'clock position of the right breast, 2 cm from the nipple, 6 mm intramammary node or cyst in the 10 o'clock position of the right breast, 1 cm from the nipple, No right axillary adenopathy. ER+/PR- Her 2 neg.   History significant for diagnosis of right breast cancer 2013 and underwent SLN/lumpectomy. 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 ER+/PR- Her 2 neg. Oncotype score 25 at that time. Patient also referred to genetics and Rad Onc at that time. Patient states she was never told she needed radiation, also does not remember results of genetics test. Chart review states done at [facility] and was negative (no panel specified). Patient also incarcerated for a time. Expresses frustration that she was told cancer was gone and now it is back.  Current 34 B. Wt stable Smoker with known COPD, uses O2 nightly. Followed by [facility] for this. +Bipolar d/o, managed by PCP.  Review of Systems    +SOB, o2 dependence + fatigue +hair loss Objective:   Physical Exam  Cardiovascular: Normal rate.normal hear sounds  Pulmonary/Chest: Effort normal. She has no wheezes.  Abdominal: Soft.  Genitourinary:  Palpable mass right breast 9 o clock, well healed scar pseudoptosis bilat SN to nipple R 20.5 L 21.5 cm BW R 12 L 13 cm Nipple to IMF R 6 L 6 cm      Assessment:     Right breast cancer     Plan:     Discussed with pt and sister that accompanies her today that if she desires reconstruction, need to quit smoking ASAP. Reviewed this will increase her risk of surgery and increase chance of wound healing problems, loss of NAC if NSM is selected. That wound healing problems could require further surgery and removal implant. Discussed staged nature  of procedure, expansion process, post op limitations and visits. Reviewed implant specific risks inc rupture, contracture, infection, need for additional surgery in future. Reviewed timing of future surgeries, symmetry procedures.  Dr. Brantley Stage has discussed NSM and she would be good candidate for this if she can quit smoking.  She has demonstrated poor understanding of her cancer treatment plan in past and lost to follow up, discussed that reconstruction will require weekly follow ups and close communication with this office.      Her COPD and O2 dependence also place her high risk of complications.   Plan placement expander, possible acellular dermis for reconstruction.  Irene Limbo, MD [facility] [phone]

## 2015-03-08 DIAGNOSIS — C50911 Malignant neoplasm of unspecified site of right female breast: Secondary | ICD-10-CM

## 2015-03-08 HISTORY — DX: Malignant neoplasm of unspecified site of right female breast: C50.911

## 2015-03-13 ENCOUNTER — Encounter (HOSPITAL_COMMUNITY): Payer: Self-pay | Admitting: *Deleted

## 2015-03-13 MED ORDER — CEFAZOLIN SODIUM-DEXTROSE 2-3 GM-% IV SOLR
2.0000 g | INTRAVENOUS | Status: DC
  Filled 2015-03-13: qty 50

## 2015-03-14 ENCOUNTER — Ambulatory Visit (HOSPITAL_COMMUNITY): Payer: Medicaid Other | Admitting: Anesthesiology

## 2015-03-14 ENCOUNTER — Observation Stay (HOSPITAL_COMMUNITY)
Admission: RE | Admit: 2015-03-14 | Discharge: 2015-03-15 | Disposition: A | Discharge status: Home / Self Care | Payer: Medicaid Other | Source: Ambulatory Visit | Attending: Surgery | Admitting: Surgery

## 2015-03-14 ENCOUNTER — Encounter (HOSPITAL_COMMUNITY): Payer: Self-pay

## 2015-03-14 ENCOUNTER — Encounter (HOSPITAL_COMMUNITY)
Admission: RE | Disposition: A | Discharge status: Home / Self Care | Payer: Self-pay | Source: Ambulatory Visit | Attending: Surgery

## 2015-03-14 DIAGNOSIS — Z9981 Dependence on supplemental oxygen: Secondary | ICD-10-CM | POA: Insufficient documentation

## 2015-03-14 DIAGNOSIS — J449 Chronic obstructive pulmonary disease, unspecified: Secondary | ICD-10-CM | POA: Insufficient documentation

## 2015-03-14 DIAGNOSIS — C50911 Malignant neoplasm of unspecified site of right female breast: Secondary | ICD-10-CM | POA: Diagnosis present

## 2015-03-14 DIAGNOSIS — F17210 Nicotine dependence, cigarettes, uncomplicated: Secondary | ICD-10-CM | POA: Insufficient documentation

## 2015-03-14 DIAGNOSIS — Z780 Asymptomatic menopausal state: Secondary | ICD-10-CM | POA: Insufficient documentation

## 2015-03-14 DIAGNOSIS — Z853 Personal history of malignant neoplasm of breast: Secondary | ICD-10-CM | POA: Insufficient documentation

## 2015-03-14 HISTORY — DX: Anemia, unspecified: D64.9

## 2015-03-14 HISTORY — PX: MASTECTOMY COMPLETE / SIMPLE: SUR845

## 2015-03-14 HISTORY — DX: Schizophrenia, unspecified: F20.9

## 2015-03-14 HISTORY — PX: BREAST RECONSTRUCTION WITH PLACEMENT OF TISSUE EXPANDER AND FLEX HD (ACELLULAR HYDRATED DERMIS): SHX6295

## 2015-03-14 HISTORY — DX: Malignant neoplasm of unspecified site of right female breast: C50.911

## 2015-03-14 HISTORY — PX: NIPPLE SPARING MASTECTOMY: SHX6537

## 2015-03-14 HISTORY — DX: Hallucinations, unspecified: R44.3

## 2015-03-14 HISTORY — DX: Reserved for inherently not codable concepts without codable children: IMO0001

## 2015-03-14 LAB — CBC WITH DIFFERENTIAL/PLATELET
Basophils Absolute: 0 10*3/uL (ref 0.0–0.1)
Basophils Relative: 0 % (ref 0–1)
EOS ABS: 0.2 10*3/uL (ref 0.0–0.7)
EOS PCT: 2 % (ref 0–5)
HCT: 43 % (ref 36.0–46.0)
HEMOGLOBIN: 14.3 g/dL (ref 12.0–15.0)
Lymphocytes Relative: 32 % (ref 12–46)
Lymphs Abs: 2.7 10*3/uL (ref 0.7–4.0)
MCH: 27.3 pg (ref 26.0–34.0)
MCHC: 33.3 g/dL (ref 30.0–36.0)
MCV: 82.2 fL (ref 78.0–100.0)
MONO ABS: 0.5 10*3/uL (ref 0.1–1.0)
MONOS PCT: 6 % (ref 3–12)
Neutro Abs: 5.1 10*3/uL (ref 1.7–7.7)
Neutrophils Relative %: 60 % (ref 43–77)
Platelets: 275 10*3/uL (ref 150–400)
RBC: 5.23 MIL/uL — ABNORMAL HIGH (ref 3.87–5.11)
RDW: 15.9 % — AB (ref 11.5–15.5)
WBC: 8.5 10*3/uL (ref 4.0–10.5)

## 2015-03-14 LAB — COMPREHENSIVE METABOLIC PANEL
ALK PHOS: 90 U/L (ref 38–126)
ALT: 16 U/L (ref 14–54)
ANION GAP: 9 (ref 5–15)
AST: 21 U/L (ref 15–41)
Albumin: 3.5 g/dL (ref 3.5–5.0)
BUN: 10 mg/dL (ref 6–20)
CHLORIDE: 105 mmol/L (ref 101–111)
CO2: 25 mmol/L (ref 22–32)
CREATININE: 0.75 mg/dL (ref 0.44–1.00)
Calcium: 9.1 mg/dL (ref 8.9–10.3)
GFR calc Af Amer: >60 mL/min (ref >60)
GFR calc non Af Amer: >60 mL/min (ref >60)
GLUCOSE: 99 mg/dL (ref 65–99)
Potassium: 4.1 mmol/L (ref 3.5–5.1)
Sodium: 139 mmol/L (ref 135–145)
Total Bilirubin: 0.2 mg/dL — ABNORMAL LOW (ref 0.3–1.2)
Total Protein: 6.7 g/dL (ref 6.5–8.1)

## 2015-03-14 SURGERY — MASTECTOMY, NIPPLE SPARING
Anesthesia: Regional | Site: Breast | Laterality: Right

## 2015-03-14 MED ORDER — NEOSTIGMINE METHYLSULFATE 10 MG/10ML IV SOLN
INTRAVENOUS | Status: AC
  Filled 2015-03-14: qty 1

## 2015-03-14 MED ORDER — FENTANYL CITRATE (PF) 100 MCG/2ML IJ SOLN
INTRAMUSCULAR | Status: DC | PRN
  Administered 2015-03-14 (×3): 50 ug via INTRAVENOUS

## 2015-03-14 MED ORDER — ONDANSETRON HCL 4 MG PO TABS: 4.0000 mg | ORAL_TABLET | Freq: Four times a day (QID) | ORAL | Status: DC | PRN

## 2015-03-14 MED ORDER — SENNOSIDES-DOCUSATE SODIUM 8.6-50 MG PO TABS: 1.0000 | ORAL_TABLET | Freq: Every evening | ORAL | Status: DC | PRN

## 2015-03-14 MED ORDER — HYDROMORPHONE HCL 1 MG/ML IJ SOLN
INTRAMUSCULAR | Status: AC
  Filled 2015-03-14: qty 1

## 2015-03-14 MED ORDER — DIVALPROEX SODIUM 500 MG PO DR TAB
500.0000 mg | DELAYED_RELEASE_TABLET | Freq: Once | ORAL | Status: AC
  Administered 2015-03-14: 500 mg via ORAL
  Filled 2015-03-14: qty 1

## 2015-03-14 MED ORDER — HYDROMORPHONE HCL 1 MG/ML IJ SOLN
0.5000 mg | INTRAMUSCULAR | Status: DC | PRN
  Administered 2015-03-14: 0.5 mg via INTRAVENOUS
  Administered 2015-03-14 – 2015-03-15 (×2): 1 mg via INTRAVENOUS
  Filled 2015-03-14 (×2): qty 1

## 2015-03-14 MED ORDER — KETOROLAC TROMETHAMINE 30 MG/ML IJ SOLN
30.0000 mg | Freq: Three times a day (TID) | INTRAMUSCULAR | Status: DC
  Administered 2015-03-14 – 2015-03-15 (×2): 30 mg via INTRAVENOUS
  Filled 2015-03-14 (×5): qty 1

## 2015-03-14 MED ORDER — ALBUTEROL SULFATE (2.5 MG/3ML) 0.083% IN NEBU
2.5000 mg | INHALATION_SOLUTION | RESPIRATORY_TRACT | Status: DC | PRN
  Filled 2015-03-14 (×2): qty 3

## 2015-03-14 MED ORDER — SULFAMETHOXAZOLE-TRIMETHOPRIM 800-160 MG PO TABS: 1.0000 | ORAL_TABLET | Freq: Two times a day (BID) | ORAL | Status: DC

## 2015-03-14 MED ORDER — ALPRAZOLAM 0.5 MG PO TABS: 0.5000 mg | ORAL_TABLET | Freq: Two times a day (BID) | ORAL | Status: DC | PRN

## 2015-03-14 MED ORDER — FLUOXETINE HCL 40 MG PO CAPS
40.0000 mg | ORAL_CAPSULE | Freq: Every day | ORAL | Status: DC
  Administered 2015-03-14 – 2015-03-15 (×2): 40 mg via ORAL
  Filled 2015-03-14 (×2): qty 1

## 2015-03-14 MED ORDER — ROCURONIUM BROMIDE 100 MG/10ML IV SOLN
INTRAVENOUS | Status: DC | PRN
  Administered 2015-03-14: 10 mg via INTRAVENOUS
  Administered 2015-03-14: 5 mg via INTRAVENOUS
  Administered 2015-03-14: 50 mg via INTRAVENOUS

## 2015-03-14 MED ORDER — CEFAZOLIN SODIUM 1-5 GM-% IV SOLN
1.0000 g | Freq: Three times a day (TID) | INTRAVENOUS | Status: DC
Start: 2015-03-14 — End: 2015-03-15
  Administered 2015-03-14 – 2015-03-15 (×2): 1 g via INTRAVENOUS
  Filled 2015-03-14 (×5): qty 50

## 2015-03-14 MED ORDER — PROPOFOL 10 MG/ML IV BOLUS
INTRAVENOUS | Status: DC | PRN
  Administered 2015-03-14: 60 mg via INTRAVENOUS

## 2015-03-14 MED ORDER — PROPOFOL 10 MG/ML IV BOLUS
INTRAVENOUS | Status: AC
  Filled 2015-03-14: qty 20

## 2015-03-14 MED ORDER — NEOSTIGMINE METHYLSULFATE 10 MG/10ML IV SOLN
INTRAVENOUS | Status: DC | PRN
  Administered 2015-03-14: 4 mg via INTRAVENOUS

## 2015-03-14 MED ORDER — 0.9 % SODIUM CHLORIDE (POUR BTL) OPTIME
TOPICAL | Status: DC | PRN
  Administered 2015-03-14: 2000 mL

## 2015-03-14 MED ORDER — FENTANYL CITRATE (PF) 250 MCG/5ML IJ SOLN
INTRAMUSCULAR | Status: AC
  Filled 2015-03-14: qty 5

## 2015-03-14 MED ORDER — MIDAZOLAM HCL 2 MG/2ML IJ SOLN
INTRAMUSCULAR | Status: AC
  Filled 2015-03-14: qty 2

## 2015-03-14 MED ORDER — GLYCOPYRROLATE 0.2 MG/ML IJ SOLN
INTRAMUSCULAR | Status: DC | PRN
  Administered 2015-03-14: 0.2 mg via INTRAVENOUS
  Administered 2015-03-14: 0.6 mg via INTRAVENOUS

## 2015-03-14 MED ORDER — CEFAZOLIN SODIUM-DEXTROSE 2-3 GM-% IV SOLR
INTRAVENOUS | Status: DC | PRN
  Administered 2015-03-14: 2 g via INTRAVENOUS

## 2015-03-14 MED ORDER — ROCURONIUM BROMIDE 50 MG/5ML IV SOLN
INTRAVENOUS | Status: AC
  Filled 2015-03-14: qty 1

## 2015-03-14 MED ORDER — DICYCLOMINE HCL 20 MG PO TABS
20.0000 mg | ORAL_TABLET | Freq: Two times a day (BID) | ORAL | Status: DC | PRN
  Filled 2015-03-14: qty 1

## 2015-03-14 MED ORDER — DEXAMETHASONE SODIUM PHOSPHATE 4 MG/ML IJ SOLN
INTRAMUSCULAR | Status: AC
  Filled 2015-03-14: qty 1

## 2015-03-14 MED ORDER — DEXAMETHASONE SODIUM PHOSPHATE 4 MG/ML IJ SOLN
INTRAMUSCULAR | Status: DC | PRN
  Administered 2015-03-14: 4 mg via INTRAVENOUS

## 2015-03-14 MED ORDER — HYDROCODONE-ACETAMINOPHEN 5-325 MG PO TABS
1.0000 | ORAL_TABLET | ORAL | Status: DC | PRN
Start: 2015-03-14 — End: 2015-03-15

## 2015-03-14 MED ORDER — ONDANSETRON HCL 4 MG/2ML IJ SOLN
INTRAMUSCULAR | Status: AC
  Filled 2015-03-14: qty 2

## 2015-03-14 MED ORDER — ALBUTEROL SULFATE HFA 108 (90 BASE) MCG/ACT IN AERS: 1.0000 | INHALATION_SPRAY | RESPIRATORY_TRACT | Status: DC | PRN

## 2015-03-14 MED ORDER — EPHEDRINE SULFATE 50 MG/ML IJ SOLN
INTRAMUSCULAR | Status: DC | PRN
  Administered 2015-03-14 (×2): 10 mg via INTRAVENOUS
  Administered 2015-03-14 (×4): 5 mg via INTRAVENOUS
  Administered 2015-03-14: 10 mg via INTRAVENOUS

## 2015-03-14 MED ORDER — SODIUM CHLORIDE 0.9 % IV SOLN
Freq: Once | INTRAVENOUS | Status: AC
  Administered 2015-03-14: 1000 mL
  Filled 2015-03-14: qty 1

## 2015-03-14 MED ORDER — MOMETASONE FURO-FORMOTEROL FUM 200-5 MCG/ACT IN AERO
2.0000 | INHALATION_SPRAY | Freq: Two times a day (BID) | RESPIRATORY_TRACT | Status: DC
  Administered 2015-03-14 – 2015-03-15 (×2): 2 via RESPIRATORY_TRACT
  Filled 2015-03-14: qty 8.8

## 2015-03-14 MED ORDER — MIDAZOLAM HCL 2 MG/2ML IJ SOLN
1.0000 mg | INTRAMUSCULAR | Status: DC | PRN
  Administered 2015-03-14 (×2): 2 mg via INTRAVENOUS
  Filled 2015-03-14: qty 2

## 2015-03-14 MED ORDER — GLYCOPYRROLATE 0.2 MG/ML IJ SOLN
INTRAMUSCULAR | Status: AC
  Filled 2015-03-14: qty 1

## 2015-03-14 MED ORDER — HYDROMORPHONE HCL 1 MG/ML IJ SOLN: 0.2500 mg | INTRAMUSCULAR | Status: DC | PRN

## 2015-03-14 MED ORDER — FENTANYL CITRATE (PF) 100 MCG/2ML IJ SOLN
50.0000 ug | INTRAMUSCULAR | Status: DC | PRN
  Administered 2015-03-14: 100 ug via INTRAVENOUS
  Filled 2015-03-14: qty 2

## 2015-03-14 MED ORDER — KCL IN DEXTROSE-NACL 20-5-0.45 MEQ/L-%-% IV SOLN
INTRAVENOUS | Status: DC
  Administered 2015-03-14: 20:00:00 via INTRAVENOUS
  Filled 2015-03-14 (×3): qty 1000

## 2015-03-14 MED ORDER — DIVALPROEX SODIUM 500 MG PO DR TAB
1000.0000 mg | DELAYED_RELEASE_TABLET | Freq: Two times a day (BID) | ORAL | Status: DC
  Administered 2015-03-15: 1000 mg via ORAL
  Filled 2015-03-14 (×3): qty 2

## 2015-03-14 MED ORDER — ONDANSETRON HCL 4 MG/2ML IJ SOLN
4.0000 mg | Freq: Four times a day (QID) | INTRAMUSCULAR | Status: DC | PRN
  Administered 2015-03-15: 4 mg via INTRAVENOUS
  Filled 2015-03-14: qty 2

## 2015-03-14 MED ORDER — GLYCOPYRROLATE 0.2 MG/ML IJ SOLN
INTRAMUSCULAR | Status: AC
  Filled 2015-03-14: qty 3

## 2015-03-14 MED ORDER — ONDANSETRON HCL 4 MG/2ML IJ SOLN
INTRAMUSCULAR | Status: DC | PRN
  Administered 2015-03-14: 4 mg via INTRAVENOUS

## 2015-03-14 MED ORDER — BUPIVACAINE-EPINEPHRINE (PF) 0.5% -1:200000 IJ SOLN
INTRAMUSCULAR | Status: DC | PRN
  Administered 2015-03-14: 30 mL

## 2015-03-14 MED ORDER — LACTATED RINGERS IV SOLN
INTRAVENOUS | Status: DC
  Administered 2015-03-14 (×2): via INTRAVENOUS

## 2015-03-14 MED ORDER — ARIPIPRAZOLE 2 MG PO TABS
2.0000 mg | ORAL_TABLET | Freq: Every day | ORAL | Status: DC
  Administered 2015-03-14 – 2015-03-15 (×2): 2 mg via ORAL
  Filled 2015-03-14 (×2): qty 1

## 2015-03-14 MED ORDER — CHLORHEXIDINE GLUCONATE 4 % EX LIQD: 1.0000 "application " | Freq: Once | CUTANEOUS | Status: DC

## 2015-03-14 MED ORDER — EPHEDRINE SULFATE 50 MG/ML IJ SOLN
INTRAMUSCULAR | Status: AC
  Filled 2015-03-14: qty 1

## 2015-03-14 MED ORDER — HYDROCODONE-ACETAMINOPHEN 5-325 MG PO TABS: 1.0000 | ORAL_TABLET | ORAL | Status: DC | PRN

## 2015-03-14 SURGICAL SUPPLY — 71 items
APPLIER CLIP 11 MED OPEN (CLIP) ×2
APPLIER CLIP 9.375 SM OPEN (CLIP) ×2
APR CLP MED 11 20 MLT OPN (CLIP) ×1
APR CLP SM 9.3 20 MLT OPN (CLIP) ×1
BAG DECANTER FOR FLEXI CONT (MISCELLANEOUS) ×3 IMPLANT
BINDER BREAST LRG (GAUZE/BANDAGES/DRESSINGS) IMPLANT
BINDER BREAST MEDIUM (GAUZE/BANDAGES/DRESSINGS) ×1 IMPLANT
BINDER BREAST XLRG (GAUZE/BANDAGES/DRESSINGS) IMPLANT
CANISTER SUCTION 2500CC (MISCELLANEOUS) ×3 IMPLANT
CHLORAPREP W/TINT 26ML (MISCELLANEOUS) ×2 IMPLANT
CLIP APPLIE 11 MED OPEN (CLIP) IMPLANT
CLIP APPLIE 9.375 SM OPEN (CLIP) IMPLANT
CONT SPEC 4OZ CLIKSEAL STRL BL (MISCELLANEOUS) ×1 IMPLANT
COVER SURGICAL LIGHT HANDLE (MISCELLANEOUS) ×2 IMPLANT
DEVICE DISSECT PLASMABLAD 3.0S (MISCELLANEOUS) IMPLANT
DRAIN CHANNEL 15F RND FF W/TCR (WOUND CARE) ×2 IMPLANT
DRAPE ORTHO SPLIT 77X108 STRL (DRAPES)
DRAPE PROXIMA HALF (DRAPES) ×1 IMPLANT
DRAPE SURG ORHT 6 SPLT 77X108 (DRAPES) ×2 IMPLANT
DRAPE WARM FLUID 44X44 (DRAPE) ×2 IMPLANT
DRSG PAD ABDOMINAL 8X10 ST (GAUZE/BANDAGES/DRESSINGS) ×4 IMPLANT
DRSG TEGADERM 4X4.75 (GAUZE/BANDAGES/DRESSINGS) ×6 IMPLANT
ELECT BLADE 4.0 EZ CLEAN MEGAD (MISCELLANEOUS)
ELECT CAUTERY BLADE 6.4 (BLADE) ×1 IMPLANT
ELECT COATED BLADE 2.86 ST (ELECTRODE) ×1 IMPLANT
ELECT REM PT RETURN 9FT ADLT (ELECTROSURGICAL) ×4
ELECTRODE BLDE 4.0 EZ CLN MEGD (MISCELLANEOUS) ×1 IMPLANT
ELECTRODE REM PT RTRN 9FT ADLT (ELECTROSURGICAL) ×1 IMPLANT
EVACUATOR SILICONE 100CC (DRAIN) ×2 IMPLANT
GAUZE SPONGE 4X4 12PLY STRL (GAUZE/BANDAGES/DRESSINGS) ×1 IMPLANT
GLOVE BIO SURGEON STRL SZ 6 (GLOVE) ×2 IMPLANT
GLOVE BIO SURGEON STRL SZ7 (GLOVE) ×1 IMPLANT
GLOVE BIOGEL PI IND STRL 7.0 (GLOVE) IMPLANT
GLOVE BIOGEL PI INDICATOR 7.0 (GLOVE) ×3
GLOVE SURG SS PI 7.0 STRL IVOR (GLOVE) ×2 IMPLANT
GOWN STRL REUS W/ TWL LRG LVL3 (GOWN DISPOSABLE) ×2 IMPLANT
GOWN STRL REUS W/ TWL XL LVL3 (GOWN DISPOSABLE) IMPLANT
GOWN STRL REUS W/TWL LRG LVL3 (GOWN DISPOSABLE) ×6
GOWN STRL REUS W/TWL XL LVL3 (GOWN DISPOSABLE) ×4
IMPLANT BREAST ARTOURA 225CC (Breast) ×1 IMPLANT
KIT BASIN OR (CUSTOM PROCEDURE TRAY) ×2 IMPLANT
KIT MARKER MARGIN INK (KITS) ×1 IMPLANT
KIT ROOM TURNOVER OR (KITS) ×2 IMPLANT
LIQUID BAND (GAUZE/BANDAGES/DRESSINGS) ×3 IMPLANT
MARKER SKIN DUAL TIP RULER LAB (MISCELLANEOUS) ×1 IMPLANT
NS IRRIG 1000ML POUR BTL (IV SOLUTION) ×4 IMPLANT
PACK GENERAL/GYN (CUSTOM PROCEDURE TRAY) ×2 IMPLANT
PAD ARMBOARD 7.5X6 YLW CONV (MISCELLANEOUS) ×2 IMPLANT
PIN SAFETY STERILE (MISCELLANEOUS) ×2 IMPLANT
PLASMABLADE 3.0S (MISCELLANEOUS) ×2
SET ASEPTIC TRANSFER (MISCELLANEOUS) ×2 IMPLANT
SOLUTION BETADINE 4OZ (MISCELLANEOUS) ×2 IMPLANT
STAPLER VISISTAT 35W (STAPLE) IMPLANT
SUT CHROMIC 5 0 P 3 (SUTURE) IMPLANT
SUT ETHILON 2 0 FS 18 (SUTURE) ×1 IMPLANT
SUT MNCRL AB 3-0 PS2 18 (SUTURE) ×2 IMPLANT
SUT MNCRL AB 4-0 PS2 18 (SUTURE) ×1 IMPLANT
SUT MON AB 5-0 PS2 18 (SUTURE) IMPLANT
SUT PDS AB 2-0 CT1 27 (SUTURE) IMPLANT
SUT SILK 3 0 SH 30 (SUTURE) IMPLANT
SUT VIC AB 3-0 PS2 18 (SUTURE)
SUT VIC AB 3-0 PS2 18XBRD (SUTURE) IMPLANT
SUT VIC AB 3-0 SH 18 (SUTURE) ×1 IMPLANT
SUT VIC AB 3-0 SH 27 (SUTURE) ×4
SUT VIC AB 3-0 SH 27X BRD (SUTURE) IMPLANT
SUT VICRYL 4-0 PS2 18IN ABS (SUTURE) ×2 IMPLANT
TOWEL OR 17X24 6PK STRL BLUE (TOWEL DISPOSABLE) ×1 IMPLANT
TOWEL OR 17X26 10 PK STRL BLUE (TOWEL DISPOSABLE) ×2 IMPLANT
TRAY FOLEY CATH 16FRSI W/METER (SET/KITS/TRAYS/PACK) ×1 IMPLANT
TUBE CONNECTING 12X1/4 (SUCTIONS) ×1 IMPLANT
YANKAUER SUCT BULB TIP NO VENT (SUCTIONS) ×1 IMPLANT

## 2015-03-14 NOTE — Interval H&P Note (Signed)
History and Physical Interval Note:  03/14/2015 10:04 AM  Courtney Terry  has presented today for surgery, with the diagnosis of Recurrent Right Breast Cancer  The various methods of treatment have been discussed with the patient and family. After consideration of risks, benefits and other options for treatment, the patient has consented to  Procedure(s): RIGHT NIPPLE SPARING MASTECTOMY AND AXILLARY LYMPH NODE DISSECTION (Right) RIGHT BREAST RECONSTRUCTION WITH TISSUE EXPANDER AND ACELLULAR DERMIS (Right) as a surgical intervention .  The patient's history has been reviewed, patient examined, no change in status, stable for surgery.  I have reviewed the patient's chart and labs.  Questions were answered to the patient's satisfaction.     Rekisha Welling A.

## 2015-03-14 NOTE — Transfer of Care (Signed)
Immediate Anesthesia Transfer of Care Note  Patient: Courtney Terry  Procedure(s) Performed: Procedure(s): RIGHT NIPPLE SPARING MASTECTOMY AND AXILLARY LYMPH NODE DISSECTION (Right) RIGHT BREAST RECONSTRUCTION WITH TISSUE EXPANDER AND ACELLULAR DERMIS (Right)  Patient Location: PACU  Anesthesia Type:General  Level of Consciousness: awake, patient cooperative and responds to stimulation  Airway & Oxygen Therapy: Patient Spontanous Breathing and Patient connected to face mask oxygen  Post-op Assessment: Report given to RN, Post -op Vital signs reviewed and stable and Patient moving all extremities X 4  Post vital signs: Reviewed and stable  Last Vitals:  Filed Vitals:   03/14/15 1013  BP: 114/74  Pulse: 64  Temp:   Resp: 12    Complications: No apparent anesthesia complications

## 2015-03-14 NOTE — Op Note (Signed)
Preoperative diagnosis: Recurrent right breast cancer  Postoperative diagnosis: Same  Procedure: Right nipple sparing mastectomy with right axillary lymph node dissection  Surgeon: Erroll Luna M.D.  Asst.: Dr. Glenis Smoker MD  Anesthesia: Gen. with pectoral block per anesthesia  EBL: Minimal  Specimen: #1 right breast tissue which is oriented which consist of the right breast minus skin  #2 right axilla contents #3 posterior  nippl  Indications for procedure: The patient is a 54 year old female who was diagnosed with right breast cancer in 2013 and underwent partial mastectomy with sentinel lymph node mapping. She received no adjuvant therapy was lost to follow-up. She returned this year for follow-up found to have a 2 cm right breast mass in her previous tumor bed. Core biopsy showed invasive ductal carcinoma. She was a candidate for further breast conservation she desired mastectomy with nipple preservation reconstruction. We cautioned her that her tobacco use and COPD would increase her complication rate of this procedure and she stated she would stop smoking. She is seeing Dr. Leland Johns.  The surgical and non surgical options have been discussed with the patient.  Risks of surgery include bleeding,  Infection,  Flap necrosis,  Tissue loss,  Chronic pain, death, Numbness,  And the need for additional procedures.  Reconstruction options also have been discussed with the patient as well.  The patient agrees to proceed. lymph node  dissection has been discussed with the patient.  Risk of bleeding,  Infection,  Seroma formation,  Additional procedures,,  Shoulder weakness ,  Shoulder stiffness,  Nerve and blood vessel injury and reaction to the mapping dyes have been discussed.  Alternatives to surgery have been discussed with the patient.  The patient agrees to proceed.   Description of procedure: Patient met in holding area and questions answered. Right side marked as correct side in films  and pathology reviewed. She was taken back to the operating room and placed upon the OR table. After induction of general anesthesia, Foley catheter was placed under sterile conditions. Upper chest regions were prepped and draped in a sterile fashion. Timeout was done to verify proper side and procedure with Dr. Leland Johns in the room. She received a pectoral block per anesthesia in the holding area. Incision made in the right axilla to lymph node dissection. This is made in the inferior portion of her hairline. The right axilla was entered and all level I and 2 nodes were excised in between the axillary vein, thoracodorsal trunk long thoracic nerve. These structures were preserved. The skin was then closed with 3-0 Monocryl. Next we turned our attention to the mastectomy. Curvilinear incision was made along the inferior mammary fold. We dissected the breast tissue off the pectoralis major posteriorly using the plasma blade. We then mobilized the breast tissue from the skin taking care to preserve the nipple. Face lift scissors were used to separate the nipple from the underlying breast tissue and a biopsy of the nipple was taken and sent for permanent section. The tumors in the right upper quadrant and there is overlying scar on top of this. I carefully dissected the skin away from midline scar tissue from her lumpectomy and tumor bed. Grossly this was clear and the only thing left anterior to this was skin. The superior portion of the breast was dissected away from the underlying skin up to the clavicle. The entire specimen was then amputated and removed from under the skin. Tumor was marked with 2 sutures and anchors used to mark the entire specimen.  Grossly, this margin was clear and again the only thing left anterior is skin. This corresponds the area on her breast where there is a scar from previous lumpectomy. The wound was made hemostatic. At this point skin flaps are viable and nipple was viable. Plastic  surgery took over the case and please see their dictation for completion of this. All final counts are found to be correct. Patient left in stable condition.

## 2015-03-14 NOTE — Anesthesia Preprocedure Evaluation (Addendum)
Anesthesia Evaluation  Patient identified by MRN, date of birth, ID band Patient awake    Reviewed: Allergy & Precautions, H&P , NPO status , Patient's Chart, lab work & pertinent test results  Airway Mallampati: I  TM Distance: >3 FB Neck ROM: Full    Dental no notable dental hx. (+) Partial Upper, Dental Advisory Given   Pulmonary asthma , COPD COPD inhaler, former smoker,  breath sounds clear to auscultation  Pulmonary exam normal       Cardiovascular negative cardio ROS  Rhythm:Regular Rate:Normal     Neuro/Psych Seizures -, Well Controlled,  Anxiety Depression Bipolar Disorder    GI/Hepatic negative GI ROS, Neg liver ROS,   Endo/Other  Hypothyroidism   Renal/GU negative Renal ROS  negative genitourinary   Musculoskeletal  (+) Arthritis -, Osteoarthritis,    Abdominal   Peds  Hematology negative hematology ROS (+)   Anesthesia Other Findings   Reproductive/Obstetrics negative OB ROS                            Anesthesia Physical Anesthesia Plan  ASA: II  Anesthesia Plan: General and Regional   Post-op Pain Management:    Induction: Intravenous  Airway Management Planned: Oral ETT  Additional Equipment:   Intra-op Plan:   Post-operative Plan: Extubation in OR  Informed Consent: I have reviewed the patients History and Physical, chart, labs and discussed the procedure including the risks, benefits and alternatives for the proposed anesthesia with the patient or authorized representative who has indicated his/her understanding and acceptance.   Dental advisory given  Plan Discussed with: CRNA  Anesthesia Plan Comments:         Anesthesia Quick Evaluation

## 2015-03-14 NOTE — H&P (Signed)
H&P   Courtney Terry (MR# [id number])      H&P Info    Author Note Status Last Update User Last Update Date/Time   Erroll Luna, MD Signed Erroll Luna, MD 02/06/2015 2:42 PM    H&P    Expand All Collapse All   Courtney Terry 01/13/2015 9:46 AM Location: [facility] Patient #: [id number] DOB: [birth year] Widowed / Language: [name] / Race: Black or African American Female  History of Present Illness Courtney Terry A. Geriann Lafont MD; 01/13/2015 10:48 AM) Patient words: breast cancer right pt presents after noticing a mass in her right breast a few months ago. has a history of stage 1 right breast cancer in 2013 and underwent right breast lumpectomy and SLN mapping but was lost to follow up. she did not receive radiation therapy and has been intermittently incarcerated. She is usually noncompliant with her care. She denies nipple discharge and site is sore.   CLINICAL DATA: 54 year old female status post ultrasound-guided right breast biopsy  EXAM: DIAGNOSTIC RIGHT MAMMOGRAM POST ULTRASOUND BIOPSY  COMPARISON: Previous exam(s).  FINDINGS: Mammographic images were obtained following ultrasound guided biopsy of a right breast mass at 10 o'clock, 2 cm from the nipple. Post biopsy mammogram confirms satisfactory placement of a ribbon clip within the palpable right breast mass.  IMPRESSION: Satisfactory placement of a ribbon clip post ultrasound-guided biopsy.  Final Assessment: Post Procedure Mammograms for Marker Placement   CLINICAL DATA: Small, enlarging mass felt by the patient in the upper outer right breast for the past 2 months. Status post right lumpectomy for breast cancer in 2013. The palpable mass is near the lumpectomy site. EXAM: DIGITAL DIAGNOSTIC BILATERAL MAMMOGRAM WITH 3D TOMOSYNTHESIS WITH CAD ULTRASOUND BILATERAL BREAST COMPARISON: Previous examinations. ACR Breast Density Category b: There are scattered areas of fibroglandular density.  FINDINGS: Interval postlumpectomy changes with surgical clips in the upper outer right breast, posteriorly. Anterior to the lumpectomy site, there is a small, oval mass containing tiny, faint, punctate microcalcifications, corresponding to the mass felt by the patient. Between at mass in the nipple, there is a second, smaller, oval mass. There is also a tiny, oval, circumscribed mass in the medial left breast. Mammographic images were processed with CAD. On physical exam, the patient has an approximately 8 mm rounded, palpable mass in the 10 o'clock position of the right breast, 2 cm from the nipple. No additional masses are palpable in either breast. There are no palpable right axillary lymph nodes. Targeted ultrasound is performed, showing a 9 x 8 x 6 mm oval, heterogeneous mass with poorly defined margins in the 10 o'clock position of the right breast, 2 cm from the nipple. This has an ill-defined peripheral echogenic component and an irregular central hypoechoic component. No definite internal blood flow was seen with color Doppler. Also demonstrated was a 6 x 4 x 3 mm near anechoic oval, circumscribed mass with an eccentric echogenic component in the 10 o'clock position of the right breast, 1 cm from the nipple. Normal appearing right axillary lymph nodes are demonstrated. In the 9 o'clock position of the left breast, 1 cm from the nipple, a 3 mm cyst was demonstrated. IMPRESSION: 1. 9 mm palpable mass in the 10 o'clock position of the right breast, 2 cm from the nipple. This has nonspecific imaging features and could represent a small area of recurrent malignancy or area of fat necrosis. 2. 6 mm intramammary lymph node or cyst in the 10 o'clock position of the right breast, 1 cm from the  nipple. 3. 3 mm 9 o'clock left breast cyst. 4. No right axillary adenopathy. RECOMMENDATION: Ultrasound-guided core needle biopsy of the 9 mm mass in the 10 o'clock position of the right breast, 2 cm from the  nipple. This has been discussed with the patient and scheduled for 3 p.m. on 12/20/2014. I have discussed the findings and recommendations with the patient. Results were also provided in writing at the conclusion of the visit. If applicable, a reminder letter will be sent to the patient regarding the next appointment. BI-RADS CATEGORY 4: Suspicious. Electronically Signed By: Courtney Terry M.D. On: 12/15/2014 16:18   External Result Report       Patient: Courtney Terry Collected: 12/28/2014 Client: [facility] Accession: [id number] Received: 12/28/2014 Courtney Hoit, MD DOB: [birth year] Age: 13 Gender: F Reported: 12/29/2014 Courtney Terry Patient Ph: [phone] MRN #: [id number] [place], [place] [address] Client Acc#: Chart #: [id number] Phone: [phone] Fax: CC: [facility] CC CC: Courtney Yehuda Mao, MD REPORT OF SURGICAL PATHOLOGY ADDITIONAL INFORMATION: PROGNOSTIC INDICATORS - ACIS Results: IMMUNOHISTOCHEMICAL AND MORPHOMETRIC ANALYSIS BY THE AUTOMATED CELLULAR IMAGING SYSTEM (ACIS) Estrogen Receptor: 100%, POSITIVE, STRONG STAINING INTENSITY Progesterone Receptor: 0%, NEGATIVE Proliferation Marker Ki67: 43% COMMENT: The negative hormone receptor study(ies) in this case has no internal positive control. REFERENCE RANGE ESTROGEN RECEPTOR NEGATIVE <1% POSITIVE =>1% PROGESTERONE RECEPTOR NEGATIVE <1% POSITIVE =>1% All controls stained appropriately Courtney Cutter MD Pathologist, Electronic Signature ( Signed 01/04/2015) CHROMOGENIC IN-SITU HYBRIDIZATION Results: HER-2/NEU BY CISH - NEGATIVE. RESULT RATIO OF HER2: CEP 17 SIGNALS 1.02 AVERAGE HER2 COPY NUMBER PER CELL 2.10 1 of 3 FINAL for Courtney Terry ([id number]) ADDITIONAL INFORMATION:(continued) REFERENCE RANGE NEGATIVE HER2/Chr17 Ratio <2.0 and Average HER2 copy number <4.0 EQUIVOCAL HER2/Chr17 Ratio <2.0 and Average HER2 copy number 4.0 and <6.0 POSITIVE HER2/Chr17 Ratio >=2.0  and/or Average HER2 copy number >=6.0 Courtney Cutter MD Pathologist, Electronic Signature ( Signed 01/03/2015) FINAL DIAGNOSIS Diagnosis Breast, right, needle core biopsy, 10 o'clock, 2 cm from the nipple - INVASIVE DUCTAL CARCINOMA. - SEE COMMENT. Microscopic Comment Although definitive grading of breast carcinoma is best done on excision, the features of the invasive tumor from the right 10 o'clock, 2 cm from the nipple biopsy, are compatible with a grade II-III breast carcinoma. Breast prognostic markers will be performed and reported in an addendum. Findings are called to the [facility] on 12/29/2014. Dr. Lyndon Code has seen this case in consultation with agreement. (RH:ecj 12/29/2014) Willeen Niece MD Pathologist, Electronic Signature (Case signed 12/29/2014) Specimen Gross and Clinical Information Specimen Comment In formalin 1:45 extracted < 1 min; HX of right lumpectomy, new palpable mass Specimen(s) Obtained: Breast, right, needle core biopsy, 10 o'clock, 2 cm from the nipple Specimen Clinical Information Fat necrosis vs. IDC Gross Received in formalin are 3 cores of soft, tan-red tissue which measure0.4 x 0.3 x 0.2 cm and up to 1.5 x 0.3 x 0.2 cm. Submitted in toto in 1 block(s) Time in Formalin 1:45 am Stain(s) used in Diagnosis: The following stain(s) were used in diagnosing the case: ER-ACIS, PR-ACIS, CISH, KI-67-ACIS. The control(s) stained appropriately. Disclaimer Her2Neu by CISH (chromogenic in-situ hybridization) is performed at [facility], using the [place] pharmDx Kit (code number [id number]). This test is used to detect the amplification of the 2 of 3 FINAL for SHALI, VESEY ([id number]) Disclaimer(continued) Her2Neu gene in interphase nuclei from formalin fixed, paraffin embedded tissue and is reported using ASCO/CAP scoring criteria.  The patient is a 54 year old female  Other Problems Elbert Ewings, CMA; 01/13/2015 9:46 AM) Anxiety  Disorder Asthma Breast Cancer Chronic Obstructive Lung Disease Depression Home Oxygen Use Lump In Breast Thyroid Disease  Past Surgical History Elbert Ewings, CMA; 01/13/2015 9:46 AM) Oral Surgery  Diagnostic Studies History Elbert Ewings, CMA; 01/13/2015 9:46 AM) Colonoscopy never Mammogram within last year  Allergies Elbert Ewings, CMA; 01/13/2015 9:47 AM) No Known Drug Allergies04/05/2015  Medication History Elbert Ewings, CMA; 01/13/2015 9:49 AM) Ciprofloxacin HCl (500MG Tablet, Oral) Active. Dulera (200-5MCG/ACT Aerosol, Inhalation) Active. Meloxicam (15MG Tablet, Oral) Active. ProAir HFA (108 (90 Base)MCG/ACT Aerosol Soln, Inhalation) Active. ProAir RespiClick (712 (90 Base)MCG/ACT Aero Pow Br Act, Inhalation) Active. Divalproex Sodium (500MG Tablet DR, Oral) Active. Abilify (2MG Tablet, Oral) Active. Depakote (500MG Tablet DR, Oral) Active. Tiotropium Bromide Monohydrate (18MCG Capsule, Inhalation) Active. Medications Reconciled  Social History Elbert Ewings, [place]; 01/13/2015 9:46 AM) Alcohol use Occasional alcohol use. Caffeine use Carbonated beverages. Illicit drug use Recently quit drug use. Tobacco use Current every day smoker.  Family History Elbert Ewings, [place]; 01/13/2015 9:46 AM) Cancer Sister. Colon Cancer Brother. Heart Disease Mother. Migraine Headache Family Members In General. Prostate Cancer Brother. Seizure disorder Family Members In General.  Pregnancy / Birth History Elbert Ewings, [place]; 01/13/2015 9:46 AM) Age at menarche 3 years. Age of menopause <46 Gravida 0 Irregular periods Para 0  Review of Systems Elbert Ewings CMA; 01/13/2015 9:46 AM) General Present- Weight Loss. Not Present- Appetite Loss, Chills, Fatigue, Fever, Night Sweats and Weight Gain. Skin Present- Rash. Not Present- Change in Wart/Mole, Dryness, Hives, Jaundice, New Lesions, Non-Healing Wounds and Ulcer. HEENT Present- Hearing Loss. Not Present- Earache,  Hoarseness, Nose Bleed, Oral Ulcers, Ringing in the Ears, Seasonal Allergies, Sinus Pain, Sore Throat, Visual Disturbances, Wears glasses/contact lenses and Yellow Eyes. Breast Present- Breast Mass and Breast Pain. Not Present- Nipple Discharge and Skin Changes. Cardiovascular Not Present- Chest Pain, Difficulty Breathing Lying Down, Leg Cramps, Palpitations, Rapid Heart Rate, Shortness of Breath and Swelling of Extremities. Gastrointestinal Not Present- Abdominal Pain, Bloating, Bloody Stool, Change in Bowel Habits, Chronic diarrhea, Constipation, Difficulty Swallowing, Excessive gas, Gets full quickly at meals, Hemorrhoids, Indigestion, Nausea, Rectal Pain and Vomiting. Female Genitourinary Present- Nocturia. Not Present- Frequency, Painful Urination, Pelvic Pain and Urgency. Musculoskeletal Present- Muscle Weakness. Not Present- Back Pain, Joint Pain, Joint Stiffness, Muscle Pain and Swelling of Extremities. Neurological Present- Seizures. Not Present- Decreased Memory, Fainting, Headaches, Numbness, Tingling, Tremor, Trouble walking and Weakness. Psychiatric Present- Anxiety and Depression. Not Present- Bipolar, Change in Sleep Pattern, Fearful and Frequent crying. Endocrine Present- Excessive Hunger. Not Present- Cold Intolerance, Hair Changes, Heat Intolerance, Hot flashes and New Diabetes. Hematology Present- Easy Bruising and Excessive bleeding. Not Present- Gland problems, HIV and Persistent Infections.   Vitals Elbert Ewings CMA; 01/13/2015 9:49 AM) 01/13/2015 9:49 AM Weight: 123 lb Height: 69in Body Surface Area: 1.65 m Body Mass Index: 18.16 kg/m Temp.: 98.64F(Oral)  Pulse: 102 (Regular)  Resp.: 18 (Unlabored)  BP: 136/72 (Sitting, Left Arm, Standard)    Physical Exam (Thedore Pickel A. Dereck Agerton MD; 01/13/2015 10:49 AM) General Mental Status-Alert. General Appearance-Consistent with stated age. Hydration-Well hydrated. Voice-Normal.  Head and  Neck Head-normocephalic, atraumatic with no lesions or palpable masses. Trachea-midline. Thyroid Gland Characteristics - normal size and consistency.  Eye Eyeball - Bilateral-Extraocular movements intact. Sclera/Conjunctiva - Bilateral-No scleral icterus.  Chest and Lung Exam Chest and lung exam reveals -quiet, even and easy respiratory effort with no use of accessory muscles and on auscultation, normal breath sounds, no adventitious sounds and normal vocal resonance.  Inspection Chest Wall - Normal. Back - normal.  Breast Note: 1 cm mobile nodule right breast scar noted     Cardiovascular Cardiovascular examination reveals -normal heart sounds, regular rate and rhythm with no murmurs and normal pedal pulses bilaterally.  Neurologic Neurologic evaluation reveals -alert and oriented x 3 with no impairment of recent or remote memory. Mental Status-Normal.  Musculoskeletal Normal Exam - Left-Upper Extremity Strength Normal and Lower Extremity Strength Normal. Normal Exam - Right-Upper Extremity Strength Normal and Lower Extremity Strength Normal.  Lymphatic Axillary  General Axillary Region: Bilateral - Description - Normal. Tenderness - Non Tender.    Assessment & Plan (Boss Danielsen A. Shealeigh Dunstan MD; 01/13/2015 10:51 AM) RECURRENT BREAST CANCER, RIGHT (174.9  C50.911) Impression: discussed options of repeat lumpectomy on the right, mastectomy and nipple sparing mastectomy with reconstruction. Pt is not compliant so I explained that lumpectomy needs to be followed with radiation therapy. She likes the idea of NSM so I will refer to plastic surgery for opinion. Pt is to return after to discuss further. Pt understands that smoking increases her operative risk.  Risk include nipple loss  / flap loss infection,  The need for recurrent surgery.  She agrees to proceed .  She has seen plastics.  Current Plans  Pt Education - [facility] breast cancer surgery: discussed with patient  and provided information. Pt Education - [facility] - breast cancer surgery- under construction 2-11

## 2015-03-14 NOTE — Op Note (Signed)
Operative Note   DATE OF OPERATION: 6.7.2016  LOCATION: [facility]  SURGICAL DIVISION: Plastic Surgery  PREOPERATIVE DIAGNOSES:  Recurrent right breast cancer  POSTOPERATIVE DIAGNOSES:  same  PROCEDURE:  1. Right breast reconstruction with tissue expander  SURGEON: Irene Limbo MD MBA  ASSISTANT: none  ANESTHESIA:  General.   EBL: 50 ml for entire procedure  COMPLICATIONS: None immediate.   INDICATIONS FOR PROCEDURE:  The patient, Courtney Terry, is a 54 y.o. female born on 10-24-1960, is here for immediate reconstruction following nipple sparing mastectomy and axillary node dissection. Patient has history of prior lumpectomy and sentinel node for cancer. She did not receive post lumpectomy radiation.    FINDINGS: Mentor Artoura High Profile expander 225 ml, Initial fill volume 80 ml. Mastectomy weight approximately 180 g  DESCRIPTION OF PROCEDURE:  The patient was marked in the preoperative area including breast meridians, sternal notch, chest midline, anterior axillary lines and inframammary folds. The patient was taken to the operating room. SCDs were placed and IV antibiotics were given. The patient's operative site was prepped and draped in a sterile fashion. A time out was performed and all information was confirmed to be correct. Following completion of mastectomy, the cavity was irrigated with saline and hemostasis obtained. The inferior insertion of pectoralis muscle corresponded with the inframammary fold of the breast. The inferior insertions of pectoralis major muscle were divided and submuscular dissection completed. Toward origin of the pectoralis muscle, the muscle was elevated continuous with serratus fascia and serratus muscle.  A 15 Fr drain was placed in subcutaneous position beneath mastectomy flap and secured to skin with 2-0 nylon. A second 15 Fr JP was placed in submuscular position.The cavity was irrigated with solution containing Ancef,  genatmicin, and bacitracin. The tissue expander was prepared and placed in submuscular position. The expander was secured to chest wall with a 3-0 vicryl. The pectoralis muscle was secured to serratus fascia and serratus muscle with interrupted 3-0 vicryl. The incision was closed with interrupted 3-0 vicryl in fascial layer and 4-0 vicryl in dermis. Skin closure completed with 4-0 monocryl subcuticular and tissue adhesive.The port was accessed and filled to 80 ml. The patient was brought to upright position and the skin flaps were redraped so that NAC was symmetric from midline. Transparent, adherent dressings applied.   The patient was allowed to wake from anesthesia, extubated and taken to the recovery room in satisfactory condition.   SPECIMENS: none  DRAINS: 15 Fr JP in right chest subcutaneous, 15 Fr JP in right chest submuscular  Irene Limbo, MD [facility] Plastic & Reconstructive Surgery [phone]

## 2015-03-14 NOTE — Anesthesia Procedure Notes (Addendum)
Anesthesia Regional Block:  Pectoralis block  Pre-Anesthetic Checklist: ,, timeout performed, Correct Patient, Correct Site, Correct Laterality, Correct Procedure, Correct Position, site marked, Risks and benefits discussed, pre-op evaluation, post-op pain management  Laterality: Right  Prep: Maximum Sterile Barrier Precautions used and chloraprep       Needles:  Injection technique: Single-shot  Needle Type: Echogenic Stimulator Needle     Needle Length: 9cm 9 cm Needle Gauge: 21 and 21 G    Additional Needles:  Procedures: ultrasound guided (picture in chart) Pectoralis block Narrative:  Start time: 03/14/2015 10:00 AM End time: 03/14/2015 10:09 AM Injection made incrementally with aspirations every 5 mL. Anesthesiologist: Roderic Palau  Additional Notes: 2% Lidocaine skin wheel.   Procedure Name: Intubation Date/Time: 03/14/2015 10:44 AM Performed by: Gershon Mussel, Samanyu Tinnell Pre-anesthesia Checklist: Patient identified, Patient being monitored, Timeout performed, Emergency Drugs available and Suction available Patient Re-evaluated:Patient Re-evaluated prior to inductionOxygen Delivery Method: Circle System Utilized Preoxygenation: Pre-oxygenation with 100% oxygen Intubation Type: IV induction Ventilation: Mask ventilation without difficulty Laryngoscope Size: Miller and 2 Grade View: Grade I Tube type: Oral Tube size: 7.0 mm Number of attempts: 1 Airway Equipment and Method: Stylet Placement Confirmation: ETT inserted through vocal cords under direct vision,  positive ETCO2 and breath sounds checked- equal and bilateral Secured at: 21 cm Tube secured with: Tape Dental Injury: Teeth and Oropharynx as per pre-operative assessment

## 2015-03-14 NOTE — Interval H&P Note (Signed)
History and Physical Interval Note:  03/14/2015 8:39 AM  Courtney Terry  has presented today for surgery, with the diagnosis of Recurrent Right Breast Cancer  The various methods of treatment have been discussed with the patient and family. After consideration of risks, benefits and other options for treatment, the patient has consented to  Procedure(s): RIGHT NIPPLE SPARING MASTECTOMY AND AXILLARY LYMPH NODE DISSECTION (Right) RIGHT BREAST RECONSTRUCTION WITH TISSUE EXPANDER AND ACELLULAR DERMIS (Right) as a surgical intervention .  The patient's history has been reviewed, patient examined, no change in status, stable for surgery.  I have reviewed the patient's chart and labs.  Questions were answered to the patient's satisfaction.     [name]

## 2015-03-14 NOTE — Anesthesia Postprocedure Evaluation (Signed)
  Anesthesia Post-op Note  Patient: Falicity Sheets  Procedure(s) Performed: Procedure(s): RIGHT NIPPLE SPARING MASTECTOMY AND AXILLARY LYMPH NODE DISSECTION (Right) RIGHT BREAST RECONSTRUCTION WITH TISSUE EXPANDER AND ACELLULAR DERMIS (Right)  Patient Location: PACU  Anesthesia Type:General and block  Level of Consciousness: awake and alert   Airway and Oxygen Therapy: Patient Spontanous Breathing  Post-op Pain: none  Post-op Assessment: Post-op Vital signs reviewed, Patient's Cardiovascular Status Stable and Respiratory Function Stable  Post-op Vital Signs: Reviewed  Filed Vitals:   03/14/15 1315  BP: 125/57  Pulse: 94  Temp: 36.1 C  Resp: 11    Complications: No apparent anesthesia complications

## 2015-03-14 NOTE — Progress Notes (Addendum)
Pt admitted to 6N16 via bed from PACU.  Pt on RA.  Pt has 18G to Lt FA.  Pt has breast binder in place with ABD pad and skin glue to rt breast.  JP X 2 to rt breast with bloody drainage.  SCDs in place.  Pt came up to floor with solid food on food tray.  Report rcvd from [place], [place]. Pt has no questions.  Will continue to monitor.

## 2015-03-15 ENCOUNTER — Encounter (HOSPITAL_COMMUNITY): Payer: Self-pay | Admitting: Surgery

## 2015-03-15 DIAGNOSIS — C50911 Malignant neoplasm of unspecified site of right female breast: Secondary | ICD-10-CM | POA: Diagnosis not present

## 2015-03-15 NOTE — Progress Notes (Signed)
Discharge instructions reviewed with pt and prescriptions given.  Also demonstrated how to empty and record JP drainage.  Pt verbalized understanding and had no questions.  Pt discharged in stable condition via wheelchair.  Eliezer [place]

## 2015-03-15 NOTE — Progress Notes (Signed)
1 Day Post-Op  Subjective: Doing well  Objective: Vital signs in last 24 hours: Temp:  [97 F (36.1 C)-98.3 F (36.8 C)] 98 F (36.7 C) (06/08 0525) Pulse Rate:  [64-98] 78 (06/08 0525) Resp:  [10-23] 17 (06/08 0135) BP: (99-153)/(57-95) 99/63 mmHg (06/08 0525) SpO2:  [65 %-100 %] 94 % (06/08 0525) Weight:  [49.896 kg (110 lb)] 49.896 kg (110 lb) (06/07 0909)    Intake/Output from previous day: 06/07 0701 - 06/08 0700 In: 1832.5 [P.O.:100; I.V.:1732.5] Out: 550 [Urine:75; Drains:425; Blood:50] Intake/Output this shift:    Incision/Wound:right breast nipple skin viable incisions CDI drains serosanguinous no hematoma   Lab Results:   Recent Labs  03/14/15 0919  WBC 8.5  HGB 14.3  HCT 43.0  PLT 275   BMET  Recent Labs  03/14/15 0919  NA 139  K 4.1  CL 105  CO2 25  GLUCOSE 99  BUN 10  CREATININE 0.75  CALCIUM 9.1   PT/INR No results for input(s): LABPROT, INR in the last 72 hours. ABG No results for input(s): PHART, HCO3 in the last 72 hours.  Invalid input(s): PCO2, PO2  Studies/Results: No results found.  Anti-infectives: Anti-infectives    Start     Dose/Rate Route Frequency Ordered Stop   03/14/15 2200  ceFAZolin (ANCEF) IVPB 1 g/50 mL premix     1 g 100 mL/hr over 30 Minutes Intravenous 3 times per day 03/14/15 1856     03/14/15 1045  bacitracin 50,000 Units, gentamicin (GARAMYCIN) 80 mg, ceFAZolin (ANCEF) 1 g in sodium chloride 0.9 % 1,000 mL      Irrigation Once 03/14/15 1043 03/14/15 1142   03/14/15 1000  ceFAZolin (ANCEF) IVPB 2 g/50 mL premix  Status:  Discontinued     2 g 100 mL/hr over 30 Minutes Intravenous To Surgery 03/13/15 0930 03/14/15 1819   03/14/15 0000  sulfamethoxazole-trimethoprim (BACTRIM DS,SEPTRA DS) 800-160 MG per tablet     1 tablet Oral 2 times daily 03/14/15 1337        Assessment/Plan: s/p Procedure(s): RIGHT NIPPLE SPARING MASTECTOMY AND AXILLARY LYMPH NODE DISSECTION (Right) RIGHT BREAST RECONSTRUCTION WITH  TISSUE EXPANDER AND ACELLULAR DERMIS (Right) Discharge     Courtney Terry A. 03/15/2015

## 2015-03-15 NOTE — Discharge Summary (Signed)
Physician Discharge Summary  Patient ID: Courtney Terry MRN: [id number] DOB/AGE: May 08, 1961 54 y.o.  Admit date: 03/14/2015 Discharge date: 03/15/2015  Admission Diagnoses:  Discharge Diagnoses:  Active Problems:   Breast cancer   Discharged Condition: good  Hospital Course: The patient was taken to the OR and underwent a right mastectomy with immediate reconstruction.  She was managed on the surgical unit postoperatively without complications.  Consults: None  Significant Diagnostic Studies: none  Treatments: surgery: right mastectomy  Discharge Exam: Blood pressure 99/63, pulse 78, temperature 98 F (36.7 C), temperature source Oral, resp. rate 17, height 5\' 10"  (1.778 m), weight 49.896 kg (110 lb), SpO2 94 %. General appearance: alert, cooperative and no distress Incision/Wound: incision intact with good capillary refill on flaps.  Disposition: 06-Home-Health Care Svc  Discharge Instructions    Call MD for:  redness, tenderness, or signs of infection (pain, swelling, bleeding, redness, odor or green/yellow discharge around incision site)    Complete by:  As directed      Call MD for:  severe or increased pain, loss or decreased feeling  in affected limb(s)    Complete by:  As directed      Diet - low sodium heart healthy    Complete by:  As directed      Discharge instructions    Complete by:  As directed   Ok to remove breast binder and dressings and shower am 03/16/15. Pat tegaderm (clear dressing) dry. Breast binder all other times.  Strip and record each drain twice daily and bring log to clinic visit.  No significant exercise, housework or activity until follow up visit.   Ok to use ibuprofen to supplement pain mediation.     Driving Restrictions    Complete by:  As directed   No driving while taking narcotics     Increase activity slowly    Complete by:  As directed      Lifting restrictions    Complete by:  As directed   No lifting greater than 5 lbs     Resume previous diet    Complete by:  As directed             Medication List    TAKE these medications        albuterol (2.5 MG/3ML) 0.083% nebulizer solution  Commonly known as:  PROVENTIL  Take 3 mLs (2.5 mg total) by nebulization every 4 (four) hours as needed for wheezing or shortness of breath.     ALPRAZolam 0.5 MG tablet  Commonly known as:  XANAX  Take 1 tablet (0.5 mg total) by mouth 2 (two) times daily as needed for anxiety (related to breathing).     ARIPiprazole 2 MG tablet  Commonly known as:  ABILIFY  Take 2 mg by mouth daily.     dicyclomine 20 MG tablet  Commonly known as:  BENTYL  Take 20 mg by mouth 2 (two) times daily as needed for spasms.     divalproex 500 MG DR tablet  Commonly known as:  DEPAKOTE  Take 1,000 mg by mouth 2 (two) times daily.     DULERA 200-5 MCG/ACT Aero  Generic drug:  mometasone-formoterol  INHALE 2 PUFFS INTO THE LUNGS TWICE A DAY     FLUoxetine 40 MG capsule  Commonly known as:  PROZAC  Take 40 mg by mouth daily.     HYDROcodone-acetaminophen 5-325 MG per tablet  Commonly known as:  NORCO/VICODIN  Take 1-2 tablets by mouth every 4 (four)  hours as needed for moderate pain.     senna-docusate 8.6-50 MG per tablet  Commonly known as:  Senokot-S  Take 1 tablet by mouth at bedtime as needed for mild constipation.     sulfamethoxazole-trimethoprim 800-160 MG per tablet  Commonly known as:  BACTRIM DS,SEPTRA DS  Take 1 tablet by mouth 2 (two) times daily.           Follow-up Information    Follow up with Irene Limbo, MD On 03/22/2015.   Specialty:  Plastic Surgery   Why:  2 45 pm   Contact information:   [address] [place] [address] [phone]       Follow up with [facility], Arnoldo Hooker, MD. Schedule an appointment as soon as possible for a visit in 1 week.   Specialty:  Plastic Surgery   Contact information:   [address] [place] [address] [phone]       Follow up  with CORNETT,THOMAS A., MD In 2 weeks.   Specialty:  General Surgery   Contact information:   [address] [place] [address] [phone]       Signed: Theodoro Kos 03/15/2015, 9:40 AM

## 2015-03-15 NOTE — Discharge Instructions (Signed)
[facility] °[phone] ° °MASTECTOMY: POST OP INSTRUCTIONS ° °Always review your discharge instruction sheet given to you by the facility where your surgery was performed. °IF YOU HAVE DISABILITY OR FAMILY LEAVE FORMS, YOU MUST BRING THEM TO THE OFFICE FOR PROCESSING.   °DO NOT GIVE THEM TO YOUR DOCTOR. °A prescription for pain medication may be given to you upon discharge.  Take your pain medication as prescribed, if needed.  If narcotic pain medicine is not needed, then you may take acetaminophen (Tylenol) or ibuprofen (Advil) as needed. °1. Take your usually prescribed medications unless otherwise directed. °2. If you need a refill on your pain medication, please contact your pharmacy.  They will contact our office to request authorization.  Prescriptions will not be filled after 5pm or on week-ends. °3. You should follow a light diet the first few days after arrival home, such as soup and crackers, etc.  Resume your normal diet the day after surgery. °4. Most patients will experience some swelling and bruising on the chest and underarm.  Ice packs will help.  Swelling and bruising can take several days to resolve.  °5. It is common to experience some constipation if taking pain medication after surgery.  Increasing fluid intake and taking a stool softener (such as Colace) will usually help or prevent this problem from occurring.  A mild laxative (Milk of Magnesia or Miralax) should be taken according to package instructions if there are no bowel movements after 48 hours. °6. Unless discharge instructions indicate otherwise, leave your bandage dry and in place until your next appointment in 3-5 days.  You may take a limited sponge bath.  No tube baths or showers until the drains are removed.  You may have steri-strips (small skin tapes) in place directly over the incision.  These strips should be left on the skin for 7-10 days.  If your surgeon used skin glue on the incision, you may  shower in 24 hours.  The glue will flake off over the next 2-3 weeks.  Any sutures or staples will be removed at the office during your follow-up visit. °7. DRAINS:  If you have drains in place, it is important to keep a list of the amount of drainage produced each day in your drains.  Before leaving the hospital, you should be instructed on drain care.  Call our office if you have any questions about your drains. °8. ACTIVITIES:  You may resume regular (light) daily activities beginning the next day--such as daily self-care, walking, climbing stairs--gradually increasing activities as tolerated.  You may have sexual intercourse when it is comfortable.  Refrain from any heavy lifting or straining until approved by your doctor. °a. You may drive when you are no longer taking prescription pain medication, you can comfortably wear a seatbelt, and you can safely maneuver your car and apply brakes. °b. RETURN TO WORK:  __________________________________________________________ °9. You should see your doctor in the office for a follow-up appointment approximately 3-5 days after your surgery.  Your doctor’s nurse will typically make your follow-up appointment when she calls you with your pathology report.  Expect your pathology report 2-3 business days after your surgery.  You may call to check if you do not hear from us after three days.   °10. OTHER INSTRUCTIONS: ______________________________________________________________________________________________ ____________________________________________________________________________________________ °WHEN TO CALL YOUR DOCTOR: °1. Fever over 101.0 °2. Nausea and/or vomiting °3. Extreme swelling or bruising °4. Continued bleeding from incision. °5. Increased pain, redness, or drainage from the incision. °  The clinic staff is available to answer your questions during regular business hours.  Please don’t hesitate to call and ask to speak to one of the nurses for clinical  concerns.  If you have a medical emergency, go to the nearest emergency room or call 911.  A surgeon from [facility] is always on call at the hospital. °[address], [address], [place], [place]  [address] ? [address] [address], [place], [place]   [address] °[phone] ? [phone] ? FAX [phone] °Web site: [email or web address] °

## 2015-03-15 NOTE — Discharge Summary (Signed)
Physician Discharge Summary  Patient ID: Courtney Terry MRN: [id number] DOB/AGE: 53-Sep-1962 53 y.o.  Admit date: 03/14/2015 Discharge date: 03/15/2015  Admission Diagnoses:right breast cancer  Discharge Diagnoses:  Active Problems:   Breast cancer   Discharged Condition: good  Hospital Course: pt did well.  She had good pain control and wound were clean and intact. Tolerating diet and had stable vss.   Consults: None  Significant Diagnostic Studies: labs:  CMP Latest Ref Rng 03/14/2015 07/03/2014 07/02/2014  Glucose 65 - 99 mg/dL 99 136(H) 128(H)  BUN 6 - 20 mg/dL 10 21 20   Creatinine 0.44 - 1.00 mg/dL 0.75 0.56 0.65  Sodium 135 - 145 mmol/L 139 143 142  Potassium 3.5 - 5.1 mmol/L 4.1 5.0 5.0  Chloride 101 - 111 mmol/L 105 101 102  CO2 22 - 32 mmol/L 25 35(H) 31  Calcium 8.9 - 10.3 mg/dL 9.1 9.1 9.4  Total Protein 6.5 - 8.1 g/dL 6.7 - -  Total Bilirubin 0.3 - 1.2 mg/dL 0.2(L) - -  Alkaline Phos 38 - 126 U/L 90 - -  AST 15 - 41 U/L 21 - -  ALT 14 - 54 U/L 16 - -     Treatments: surgery: right NSM  with ALND and reconstruction  Discharge Exam: Blood pressure 99/63, pulse 78, temperature 98 F (36.7 C), temperature source Oral, resp. rate 17, height 5\' 10"  (1.778 m), weight 49.896 kg (110 lb), SpO2 94 %. Incision/Wound:CLEAN  NO HEMATOMA NIPPLE SKIN VIABLE   Disposition: 06-Home-Health Care Svc  Discharge Instructions    Call MD for:  redness, tenderness, or signs of infection (pain, swelling, bleeding, redness, odor or green/yellow discharge around incision site)    Complete by:  As directed      Call MD for:  severe or increased pain, loss or decreased feeling  in affected limb(s)    Complete by:  As directed      Discharge instructions    Complete by:  As directed   Ok to remove breast binder and dressings and shower am 03/16/15. Pat tegaderm (clear dressing) dry. Breast binder all other times.  Strip and record each drain twice daily and bring log to clinic visit.  No  significant exercise, housework or activity until follow up visit.   Ok to use ibuprofen to supplement pain mediation.     Driving Restrictions    Complete by:  As directed   No driving while taking narcotics     Lifting restrictions    Complete by:  As directed   No lifting greater than 5 lbs     Resume previous diet    Complete by:  As directed             Medication List    TAKE these medications        albuterol (2.5 MG/3ML) 0.083% nebulizer solution  Commonly known as:  PROVENTIL  Take 3 mLs (2.5 mg total) by nebulization every 4 (four) hours as needed for wheezing or shortness of breath.     ALPRAZolam 0.5 MG tablet  Commonly known as:  XANAX  Take 1 tablet (0.5 mg total) by mouth 2 (two) times daily as needed for anxiety (related to breathing).     ARIPiprazole 2 MG tablet  Commonly known as:  ABILIFY  Take 2 mg by mouth daily.     dicyclomine 20 MG tablet  Commonly known as:  BENTYL  Take 20 mg by mouth 2 (two) times daily as needed for spasms.  divalproex 500 MG DR tablet  Commonly known as:  DEPAKOTE  Take 1,000 mg by mouth 2 (two) times daily.     DULERA 200-5 MCG/ACT Aero  Generic drug:  mometasone-formoterol  INHALE 2 PUFFS INTO THE LUNGS TWICE A DAY     FLUoxetine 40 MG capsule  Commonly known as:  PROZAC  Take 40 mg by mouth daily.     HYDROcodone-acetaminophen 5-325 MG per tablet  Commonly known as:  NORCO/VICODIN  Take 1-2 tablets by mouth every 4 (four) hours as needed for moderate pain.     senna-docusate 8.6-50 MG per tablet  Commonly known as:  Senokot-S  Take 1 tablet by mouth at bedtime as needed for mild constipation.     sulfamethoxazole-trimethoprim 800-160 MG per tablet  Commonly known as:  BACTRIM DS,SEPTRA DS  Take 1 tablet by mouth 2 (two) times daily.           Follow-up Information    Follow up with Irene Limbo, MD On 03/22/2015.   Specialty:  Plastic Surgery   Why:  2 45 pm   Contact information:   [address] [place] [address] [phone]       Follow up with [facility], Arnoldo Hooker, MD. Schedule an appointment as soon as possible for a visit in 1 week.   Specialty:  Plastic Surgery   Contact information:   [address] [place] [address] [phone]       Signed: Alper Guilmette A. 03/15/2015, 7:29 AM

## 2015-03-20 ENCOUNTER — Telehealth: Payer: Self-pay | Admitting: *Deleted

## 2015-03-20 ENCOUNTER — Encounter: Payer: Self-pay | Admitting: *Deleted

## 2015-03-20 NOTE — Telephone Encounter (Signed)
Received order from Dr. Burr Medico for oncotype testing. Requisition sent to pathology. Received by Alyse Low.

## 2015-03-21 ENCOUNTER — Telehealth: Payer: Self-pay | Admitting: Hematology

## 2015-03-21 NOTE — Telephone Encounter (Signed)
Called and left a message with new follow up

## 2015-03-27 ENCOUNTER — Telehealth: Payer: Self-pay | Admitting: *Deleted

## 2015-03-27 ENCOUNTER — Other Ambulatory Visit: Payer: Self-pay | Admitting: Hematology

## 2015-03-27 NOTE — Telephone Encounter (Signed)
Spoke with [facility] and informed her re:  Dr. Burr Medico would like for the tumor to be tested for oncotype in the ductal component.  Joycelyn Schmid voiced understanding and stated she would send testing request. [place]  Phone    [phone].

## 2015-04-03 ENCOUNTER — Other Ambulatory Visit: Payer: Self-pay

## 2015-04-03 ENCOUNTER — Ambulatory Visit: Payer: Self-pay | Admitting: Hematology

## 2015-04-05 ENCOUNTER — Other Ambulatory Visit: Payer: Self-pay | Admitting: *Deleted

## 2015-04-05 DIAGNOSIS — C50419 Malignant neoplasm of upper-outer quadrant of unspecified female breast: Secondary | ICD-10-CM

## 2015-04-06 ENCOUNTER — Encounter: Payer: Self-pay | Admitting: Hematology

## 2015-04-06 ENCOUNTER — Other Ambulatory Visit: Payer: Self-pay

## 2015-04-06 NOTE — Progress Notes (Signed)
No show  This encounter was created in error - please disregard.

## 2015-04-07 ENCOUNTER — Telehealth: Payer: Self-pay | Admitting: Hematology

## 2015-04-07 NOTE — Telephone Encounter (Signed)
per pof to sch pt appt-cld * spoke to pt and gave pt time & date of appt-pt understood °

## 2015-04-24 ENCOUNTER — Other Ambulatory Visit (HOSPITAL_BASED_OUTPATIENT_CLINIC_OR_DEPARTMENT_OTHER): Payer: Medicaid Other

## 2015-04-24 ENCOUNTER — Telehealth: Payer: Self-pay | Admitting: Hematology

## 2015-04-24 ENCOUNTER — Ambulatory Visit (HOSPITAL_BASED_OUTPATIENT_CLINIC_OR_DEPARTMENT_OTHER): Payer: Medicaid Other | Admitting: Hematology

## 2015-04-24 ENCOUNTER — Encounter: Payer: Self-pay | Admitting: Hematology

## 2015-04-24 VITALS — BP 120/82 | HR 73 | Temp 98.2°F | Resp 17 | Ht 70.0 in | Wt 100.0 lb

## 2015-04-24 DIAGNOSIS — Z79811 Long term (current) use of aromatase inhibitors: Secondary | ICD-10-CM | POA: Diagnosis not present

## 2015-04-24 DIAGNOSIS — C50411 Malignant neoplasm of upper-outer quadrant of right female breast: Secondary | ICD-10-CM

## 2015-04-24 DIAGNOSIS — C50419 Malignant neoplasm of upper-outer quadrant of unspecified female breast: Secondary | ICD-10-CM

## 2015-04-24 DIAGNOSIS — Z853 Personal history of malignant neoplasm of breast: Secondary | ICD-10-CM

## 2015-04-24 DIAGNOSIS — Z170 Estrogen receptor positive status [ER+]: Secondary | ICD-10-CM | POA: Diagnosis not present

## 2015-04-24 LAB — COMPREHENSIVE METABOLIC PANEL (CC13)
ALT: 14 U/L (ref 0–55)
AST: 18 U/L (ref 5–34)
Albumin: 3.7 g/dL (ref 3.5–5.0)
Alkaline Phosphatase: 105 U/L (ref 40–150)
Anion Gap: 6 meq/L (ref 3–11)
BUN: 13.7 mg/dL (ref 7.0–26.0)
CO2: 30 meq/L — ABNORMAL HIGH (ref 22–29)
Calcium: 10 mg/dL (ref 8.4–10.4)
Chloride: 106 meq/L (ref 98–109)
Creatinine: 0.8 mg/dL (ref 0.6–1.1)
EGFR: >90 ml/min/1.73 m2 (ref >90)
Glucose: 99 mg/dL (ref 70–140)
Potassium: 4.9 meq/L (ref 3.5–5.1)
Sodium: 143 meq/L (ref 136–145)
Total Bilirubin: 0.38 mg/dL (ref 0.20–1.20)
Total Protein: 7.4 g/dL (ref 6.4–8.3)

## 2015-04-24 LAB — CBC WITH DIFFERENTIAL/PLATELET
BASO%: 0.7 % (ref 0.0–2.0)
Basophils Absolute: 0 10*3/uL (ref 0.0–0.1)
EOS%: 1.4 % (ref 0.0–7.0)
Eosinophils Absolute: 0.1 10*3/uL (ref 0.0–0.5)
HCT: 46.6 % (ref 34.8–46.6)
HGB: 14.9 g/dL (ref 11.6–15.9)
LYMPH%: 27.6 % (ref 14.0–49.7)
MCH: 27.3 pg (ref 25.1–34.0)
MCHC: 31.9 g/dL (ref 31.5–36.0)
MCV: 85.7 fL (ref 79.5–101.0)
MONO#: 0.6 10*3/uL (ref 0.1–0.9)
MONO%: 8.2 % (ref 0.0–14.0)
NEUT%: 62.1 % (ref 38.4–76.8)
NEUTROS ABS: 4.3 10*3/uL (ref 1.5–6.5)
Platelets: 289 10*3/uL (ref 145–400)
RBC: 5.44 10*6/uL (ref 3.70–5.45)
RDW: 15.7 % — AB (ref 11.2–14.5)
WBC: 6.9 10*3/uL (ref 3.9–10.3)
lymph#: 1.9 10*3/uL (ref 0.9–3.3)

## 2015-04-24 MED ORDER — ANASTROZOLE 1 MG PO TABS: 1.0000 mg | ORAL_TABLET | Freq: Every day | ORAL | Status: DC

## 2015-04-24 NOTE — Telephone Encounter (Signed)
per pof to sch pt appt-gave pt copy of avs °
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
## 2015-05-04 ENCOUNTER — Ambulatory Visit: Payer: Medicaid Other | Attending: Plastic Surgery | Admitting: Physical Therapy

## 2015-05-10 ENCOUNTER — Other Ambulatory Visit: Payer: Self-pay | Admitting: Internal Medicine

## 2015-05-15 ENCOUNTER — Other Ambulatory Visit: Payer: Self-pay | Admitting: Internal Medicine

## 2015-05-15 ENCOUNTER — Other Ambulatory Visit: Payer: Self-pay | Admitting: *Deleted

## 2015-05-15 DIAGNOSIS — C50419 Malignant neoplasm of upper-outer quadrant of unspecified female breast: Secondary | ICD-10-CM

## 2015-05-15 MED ORDER — ANASTROZOLE 1 MG PO TABS: 1.0000 mg | ORAL_TABLET | Freq: Every day | ORAL | Status: DC

## 2015-05-22 ENCOUNTER — Telehealth: Payer: Self-pay | Admitting: Hematology

## 2015-05-22 ENCOUNTER — Other Ambulatory Visit: Payer: Self-pay

## 2015-05-22 ENCOUNTER — Ambulatory Visit: Payer: Self-pay | Admitting: Hematology

## 2015-05-22 NOTE — Telephone Encounter (Signed)
Gina from [employer] cld to r/s apt appt-gave Gine r/s appt time & date

## 2015-05-29 ENCOUNTER — Encounter: Payer: Self-pay | Admitting: Hematology

## 2015-05-29 ENCOUNTER — Other Ambulatory Visit: Payer: Self-pay

## 2015-05-29 ENCOUNTER — Other Ambulatory Visit: Payer: Self-pay | Admitting: *Deleted

## 2015-05-29 ENCOUNTER — Telehealth: Payer: Self-pay | Admitting: Hematology

## 2015-05-29 ENCOUNTER — Telehealth: Payer: Self-pay | Admitting: *Deleted

## 2015-05-29 NOTE — Progress Notes (Signed)
No show  This encounter was created in error - please disregard.

## 2015-05-29 NOTE — Telephone Encounter (Signed)
Received call from Dorene Sorrow, transportation provider for pt re:  The ACT team was not able to get hold of pt today to bring pt in for appts.  Gina requested appts to be rescheduled to later in the week.  POF sent to scheduler. Gina's   Phone     [phone].

## 2015-05-29 NOTE — Telephone Encounter (Signed)
Called and spoke with Courtney Terry([phone]) per pof. She confirmed appointment for 08/25

## 2015-06-01 ENCOUNTER — Other Ambulatory Visit: Payer: Self-pay

## 2015-06-01 ENCOUNTER — Encounter: Payer: Self-pay | Admitting: Hematology

## 2015-06-01 NOTE — Progress Notes (Signed)
No show  This encounter was created in error - please disregard.

## 2015-06-02 NOTE — H&P (Signed)
  Subjective:    Patient ID: Courtney Terry is a 54 y.o. female.  HPI  9 weeks post op right nipple sparing mastectomy with expander reconstruction for recurrent cancer. Here to make final decisions prior to second stage breast reconstruction. Asked friend to come with her for implant discussion but she is working. Patient concerned with her wt loss, at least 10 lbs over last two months. She thinks she has a thyroid problem and for last few days has been taking two of her thyroid pills. She is requesting Rx for medication to gain weight.   Represented with palpable mass that has been growing for 6 months. MMG/US 9 mm palpable mass in the 10 o'clock position of the right breast, 2 cm from the nipple, 6 mm intramammary node or cyst in the 10 o'clock position of the right breast, 1 cm from the nipple, No right axillary adenopathy. ER+/PR- Her 2 -. 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 Nodes negative. Oncotype 30 r. [facility]ico did not strongly recommend chemotherapy given pshycological issues and patient declined chemotherapy as well. AI prescribed  History significant for diagnosis of right breast cancer 2013 and underwent SLN/lumpectomy. 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 ER+/PR- Her 2 neg. Oncotype score 25 at that time. Patient also referred to genetics and Rad Onc at that time. Patient states she was never told she needed radiation, also does not remember results of genetics test. Chart review states done at [facility] and was negative (no panel specified).   Prior 34 B. Mastectomy weight 113 g COPD, uses O2 nightly. Followed by [facility] for this. +Bipolar d/o, managed by PCP.  Review of Systems     Objective:   Physical Exam  Cardiovascular: Normal rate, regular rhythm and normal heart sounds.  Pulmonary/Chest: Effort normal and breath sounds normal.  Abdominal: Soft.      right nipple pink , hypopigmented,  Left breast no massses with  pseudoptosis, significant soft tissue loss since initial consultations SN to nipple R 18 L 18.5 cm BW R 12 L 12 cm Nipple to IMF R 6 L 6 cm   Assessment:     Right breast cancer recurrent S/p NSM, TE reconstruction (serratus fascia/subpectoral)  Plan:     Plan implant exchange and opposite augmentation for symmetry. Discussed anatomic vs round, saline vs silicone implants. Given how thin she is, my preference would be anatomic implants. Saline vs silicone, counseled her that saline does not require any tests to diagnose rupture vs silicone may have no symptoms rupture , may need MRI to diagnose. Reviewed importance of follow up with physicians. She is agreeable to anatomic saline implants. Plan observation stay. Reviewed drains, possible bilateral.   Counseled she needs to take her thyroid pills at prescribed level and to discuss her concerns with PCP, has appt next week. Review of [facility] chart with 40 lb wt loss since 08/2014   Mentor Artoura High Profile expander 225 ml,  Total fill volume 125 ml.      Irene Limbo, MD [facility] [phone]

## 2015-06-05 ENCOUNTER — Other Ambulatory Visit: Payer: Self-pay

## 2015-06-05 ENCOUNTER — Ambulatory Visit: Payer: Self-pay | Admitting: Hematology

## 2015-06-05 MED ORDER — CEFAZOLIN SODIUM-DEXTROSE 2-3 GM-% IV SOLR
2.0000 g | INTRAVENOUS | Status: AC
  Administered 2015-06-06: 2 g via INTRAVENOUS
  Filled 2015-06-05: qty 50

## 2015-06-05 MED ORDER — CHLORHEXIDINE GLUCONATE 4 % EX LIQD: 1.0000 "application " | Freq: Once | CUTANEOUS | Status: DC

## 2015-06-05 NOTE — Progress Notes (Signed)
Chart reviewed by Dr Al Corpus and due to pt hx of continuous use of home O2 at 2l/min, pt will be better served being done at [place].

## 2015-06-05 NOTE — Progress Notes (Signed)
I was unable to reach patient by phone.  I left  A message on voice mail.  I instructed the patient to arrive at [place] entrance at 8:45   , nothing to eat or drink after midnight.   I instructed the patient to take the following medications in the am with just enough water to get them down: Depakote, Abilify, Prozac, Arimidex (if she can take on an empty stomach); if needed Xanax.  [place] inhalers, bring Albuterol inhaler to the hospital with you. I asked patient to not wear any lotions, powders, cologne, jewelry, piercing, make-up or nail polish.  I asked the patient to call [phone], in the am if there were any questions or problems.

## 2015-06-06 ENCOUNTER — Ambulatory Visit (HOSPITAL_COMMUNITY): Payer: Medicaid Other | Admitting: Certified Registered Nurse Anesthetist

## 2015-06-06 ENCOUNTER — Observation Stay (HOSPITAL_COMMUNITY)
Admission: RE | Admit: 2015-06-06 | Discharge: 2015-06-07 | Disposition: A | Discharge status: Home / Self Care | Payer: Medicaid Other | Source: Ambulatory Visit | Attending: Plastic Surgery | Admitting: Plastic Surgery

## 2015-06-06 ENCOUNTER — Encounter (HOSPITAL_COMMUNITY): Payer: Self-pay | Admitting: *Deleted

## 2015-06-06 ENCOUNTER — Encounter (HOSPITAL_COMMUNITY)
Admission: RE | Disposition: A | Discharge status: Home / Self Care | Payer: Self-pay | Source: Ambulatory Visit | Attending: Plastic Surgery

## 2015-06-06 DIAGNOSIS — M1990 Unspecified osteoarthritis, unspecified site: Secondary | ICD-10-CM | POA: Diagnosis not present

## 2015-06-06 DIAGNOSIS — F319 Bipolar disorder, unspecified: Secondary | ICD-10-CM | POA: Insufficient documentation

## 2015-06-06 DIAGNOSIS — Z853 Personal history of malignant neoplasm of breast: Secondary | ICD-10-CM | POA: Insufficient documentation

## 2015-06-06 DIAGNOSIS — Z170 Estrogen receptor positive status [ER+]: Secondary | ICD-10-CM | POA: Diagnosis not present

## 2015-06-06 DIAGNOSIS — Z9981 Dependence on supplemental oxygen: Secondary | ICD-10-CM | POA: Insufficient documentation

## 2015-06-06 DIAGNOSIS — Z421 Encounter for breast reconstruction following mastectomy: Principal | ICD-10-CM | POA: Insufficient documentation

## 2015-06-06 DIAGNOSIS — E039 Hypothyroidism, unspecified: Secondary | ICD-10-CM | POA: Insufficient documentation

## 2015-06-06 DIAGNOSIS — Z9011 Acquired absence of right breast and nipple: Secondary | ICD-10-CM | POA: Insufficient documentation

## 2015-06-06 DIAGNOSIS — F17200 Nicotine dependence, unspecified, uncomplicated: Secondary | ICD-10-CM | POA: Insufficient documentation

## 2015-06-06 DIAGNOSIS — J449 Chronic obstructive pulmonary disease, unspecified: Secondary | ICD-10-CM | POA: Insufficient documentation

## 2015-06-06 DIAGNOSIS — Z9010 Acquired absence of unspecified breast and nipple: Secondary | ICD-10-CM

## 2015-06-06 HISTORY — PX: REMOVAL OF TISSUE EXPANDER AND PLACEMENT OF IMPLANT: SHX6457

## 2015-06-06 LAB — BASIC METABOLIC PANEL
Anion gap: 6 (ref 5–15)
BUN: 12 mg/dL (ref 6–20)
CHLORIDE: 110 mmol/L (ref 101–111)
CO2: 26 mmol/L (ref 22–32)
CREATININE: 0.65 mg/dL (ref 0.44–1.00)
Calcium: 8.9 mg/dL (ref 8.9–10.3)
GFR calc Af Amer: >60 mL/min (ref >60)
GFR calc non Af Amer: >60 mL/min (ref >60)
GLUCOSE: 84 mg/dL (ref 65–99)
Potassium: 3.8 mmol/L (ref 3.5–5.1)
Sodium: 142 mmol/L (ref 135–145)

## 2015-06-06 LAB — CBC
HCT: 41.1 % (ref 36.0–46.0)
Hemoglobin: 13.3 g/dL (ref 12.0–15.0)
MCH: 27.7 pg (ref 26.0–34.0)
MCHC: 32.4 g/dL (ref 30.0–36.0)
MCV: 85.4 fL (ref 78.0–100.0)
Platelets: 246 10*3/uL (ref 150–400)
RBC: 4.81 MIL/uL (ref 3.87–5.11)
RDW: 15.4 % (ref 11.5–15.5)
WBC: 6.2 10*3/uL (ref 4.0–10.5)

## 2015-06-06 SURGERY — REMOVAL, TISSUE EXPANDER, BREAST, WITH IMPLANT INSERTION
Anesthesia: General | Site: Breast | Laterality: Right

## 2015-06-06 MED ORDER — ALBUTEROL SULFATE HFA 108 (90 BASE) MCG/ACT IN AERS
1.0000 | INHALATION_SPRAY | RESPIRATORY_TRACT | Status: DC | PRN
Start: 2015-06-06 — End: 2015-06-06

## 2015-06-06 MED ORDER — MEPERIDINE HCL 25 MG/ML IJ SOLN: 6.2500 mg | INTRAMUSCULAR | Status: DC | PRN

## 2015-06-06 MED ORDER — LIDOCAINE HCL (CARDIAC) 20 MG/ML IV SOLN
INTRAVENOUS | Status: AC
  Filled 2015-06-06: qty 5

## 2015-06-06 MED ORDER — SODIUM CHLORIDE 0.9 % IR SOLN
Status: DC | PRN
  Administered 2015-06-06: 1

## 2015-06-06 MED ORDER — EPHEDRINE SULFATE 50 MG/ML IJ SOLN
INTRAMUSCULAR | Status: DC | PRN
  Administered 2015-06-06 (×4): 5 mg via INTRAVENOUS

## 2015-06-06 MED ORDER — HYDROMORPHONE HCL 1 MG/ML IJ SOLN
0.5000 mg | INTRAMUSCULAR | Status: DC | PRN
  Administered 2015-06-06 – 2015-06-07 (×2): 1 mg via INTRAVENOUS
  Filled 2015-06-06 (×2): qty 1

## 2015-06-06 MED ORDER — LIDOCAINE HCL (CARDIAC) 20 MG/ML IV SOLN
INTRAVENOUS | Status: DC | PRN
  Administered 2015-06-06: 60 mg via INTRAVENOUS

## 2015-06-06 MED ORDER — CEFAZOLIN SODIUM 1-5 GM-% IV SOLN
1.0000 g | Freq: Three times a day (TID) | INTRAVENOUS | Status: DC
  Administered 2015-06-06 – 2015-06-07 (×2): 1 g via INTRAVENOUS
  Filled 2015-06-06 (×5): qty 50

## 2015-06-06 MED ORDER — INFLUENZA VAC SPLIT QUAD 0.5 ML IM SUSY
0.5000 mL | PREFILLED_SYRINGE | INTRAMUSCULAR | Status: DC
  Filled 2015-06-06: qty 0.5

## 2015-06-06 MED ORDER — ROCURONIUM BROMIDE 50 MG/5ML IV SOLN
INTRAVENOUS | Status: AC
  Filled 2015-06-06: qty 1

## 2015-06-06 MED ORDER — ONDANSETRON HCL 4 MG/2ML IJ SOLN
INTRAMUSCULAR | Status: AC
  Filled 2015-06-06: qty 2

## 2015-06-06 MED ORDER — MIDAZOLAM HCL 2 MG/2ML IJ SOLN
INTRAMUSCULAR | Status: AC
  Filled 2015-06-06: qty 4

## 2015-06-06 MED ORDER — ONDANSETRON HCL 4 MG/2ML IJ SOLN
INTRAMUSCULAR | Status: DC | PRN
  Administered 2015-06-06: 4 mg via INTRAVENOUS

## 2015-06-06 MED ORDER — PROPOFOL 10 MG/ML IV BOLUS
INTRAVENOUS | Status: AC
  Filled 2015-06-06: qty 20

## 2015-06-06 MED ORDER — LEVOTHYROXINE SODIUM 50 MCG PO TABS
75.0000 ug | ORAL_TABLET | Freq: Every day | ORAL | Status: DC
  Administered 2015-06-07: 75 ug via ORAL
  Filled 2015-06-06 (×2): qty 1

## 2015-06-06 MED ORDER — PROMETHAZINE HCL 25 MG/ML IJ SOLN: 6.2500 mg | INTRAMUSCULAR | Status: DC | PRN

## 2015-06-06 MED ORDER — HYDROMORPHONE HCL 1 MG/ML IJ SOLN: 0.2500 mg | INTRAMUSCULAR | Status: DC | PRN

## 2015-06-06 MED ORDER — PROPOFOL 10 MG/ML IV BOLUS
INTRAVENOUS | Status: DC | PRN
  Administered 2015-06-06: 150 mg via INTRAVENOUS
  Administered 2015-06-06: 20 mg via INTRAVENOUS
  Administered 2015-06-06: 50 mg via INTRAVENOUS

## 2015-06-06 MED ORDER — LACTATED RINGERS IV SOLN
INTRAVENOUS | Status: DC
  Administered 2015-06-06 (×2): via INTRAVENOUS

## 2015-06-06 MED ORDER — SULFAMETHOXAZOLE-TRIMETHOPRIM 800-160 MG PO TABS: 1.0000 | ORAL_TABLET | Freq: Two times a day (BID) | ORAL | Status: DC

## 2015-06-06 MED ORDER — SODIUM CHLORIDE 0.9 % IV SOLN
Freq: Once | INTRAVENOUS | Status: AC
  Administered 2015-06-06: 12:00:00
  Filled 2015-06-06: qty 1

## 2015-06-06 MED ORDER — ARTIFICIAL TEARS OP OINT
TOPICAL_OINTMENT | OPHTHALMIC | Status: DC | PRN
  Administered 2015-06-06: 1 via OPHTHALMIC

## 2015-06-06 MED ORDER — KETOROLAC TROMETHAMINE 30 MG/ML IJ SOLN
30.0000 mg | Freq: Three times a day (TID) | INTRAMUSCULAR | Status: AC
  Administered 2015-06-06 – 2015-06-07 (×2): 30 mg via INTRAVENOUS
  Filled 2015-06-06 (×2): qty 1

## 2015-06-06 MED ORDER — MIDAZOLAM HCL 5 MG/5ML IJ SOLN
INTRAMUSCULAR | Status: DC | PRN
  Administered 2015-06-06: 2 mg via INTRAVENOUS

## 2015-06-06 MED ORDER — GLYCOPYRROLATE 0.2 MG/ML IJ SOLN
INTRAMUSCULAR | Status: DC | PRN
  Administered 2015-06-06: 0.4 mg via INTRAVENOUS

## 2015-06-06 MED ORDER — ANASTROZOLE 1 MG PO TABS
1.0000 mg | ORAL_TABLET | Freq: Every day | ORAL | Status: DC
  Administered 2015-06-06: 1 mg via ORAL
  Filled 2015-06-06 (×2): qty 1

## 2015-06-06 MED ORDER — KETOROLAC TROMETHAMINE 30 MG/ML IJ SOLN
INTRAMUSCULAR | Status: DC | PRN
  Administered 2015-06-06: 30 mg via INTRAVENOUS

## 2015-06-06 MED ORDER — ARTIFICIAL TEARS OP OINT
TOPICAL_OINTMENT | OPHTHALMIC | Status: AC
  Filled 2015-06-06: qty 3.5

## 2015-06-06 MED ORDER — FENTANYL CITRATE (PF) 100 MCG/2ML IJ SOLN
INTRAMUSCULAR | Status: DC | PRN
  Administered 2015-06-06 (×2): 50 ug via INTRAVENOUS

## 2015-06-06 MED ORDER — ALBUTEROL SULFATE (2.5 MG/3ML) 0.083% IN NEBU: 2.5000 mg | INHALATION_SOLUTION | RESPIRATORY_TRACT | Status: DC | PRN

## 2015-06-06 MED ORDER — MOMETASONE FURO-FORMOTEROL FUM 200-5 MCG/ACT IN AERO
2.0000 | INHALATION_SPRAY | Freq: Two times a day (BID) | RESPIRATORY_TRACT | Status: DC
  Administered 2015-06-06: 2 via RESPIRATORY_TRACT
  Filled 2015-06-06: qty 8.8

## 2015-06-06 MED ORDER — ROCURONIUM BROMIDE 100 MG/10ML IV SOLN
INTRAVENOUS | Status: DC | PRN
  Administered 2015-06-06: 30 mg via INTRAVENOUS
  Administered 2015-06-06: 5 mg via INTRAVENOUS

## 2015-06-06 MED ORDER — OXYCODONE HCL 5 MG PO TABS
5.0000 mg | ORAL_TABLET | ORAL | Status: DC | PRN
Start: 2015-06-06 — End: 2015-06-07

## 2015-06-06 MED ORDER — ACETAMINOPHEN 10 MG/ML IV SOLN
INTRAVENOUS | Status: DC | PRN
  Administered 2015-06-06: 1000 mg via INTRAVENOUS

## 2015-06-06 MED ORDER — POVIDONE-IODINE 10 % EX SOLN
CUTANEOUS | Status: DC | PRN
  Administered 2015-06-06: 2 via TOPICAL

## 2015-06-06 MED ORDER — GLYCOPYRROLATE 0.2 MG/ML IJ SOLN
INTRAMUSCULAR | Status: AC
  Filled 2015-06-06: qty 3

## 2015-06-06 MED ORDER — KCL IN DEXTROSE-NACL 20-5-0.45 MEQ/L-%-% IV SOLN
INTRAVENOUS | Status: DC
  Administered 2015-06-06 – 2015-06-07 (×2): via INTRAVENOUS
  Filled 2015-06-06 (×2): qty 1000

## 2015-06-06 MED ORDER — ONDANSETRON HCL 4 MG/2ML IJ SOLN
4.0000 mg | Freq: Four times a day (QID) | INTRAMUSCULAR | Status: DC | PRN
  Filled 2015-06-06: qty 2

## 2015-06-06 MED ORDER — FLUOXETINE HCL 20 MG PO CAPS
40.0000 mg | ORAL_CAPSULE | Freq: Every day | ORAL | Status: DC
  Administered 2015-06-06: 40 mg via ORAL
  Filled 2015-06-06 (×3): qty 2

## 2015-06-06 MED ORDER — DIVALPROEX SODIUM 500 MG PO DR TAB
1000.0000 mg | DELAYED_RELEASE_TABLET | Freq: Two times a day (BID) | ORAL | Status: DC
  Administered 2015-06-06: 1000 mg via ORAL
  Filled 2015-06-06 (×3): qty 2

## 2015-06-06 MED ORDER — FENTANYL CITRATE (PF) 250 MCG/5ML IJ SOLN
INTRAMUSCULAR | Status: AC
  Filled 2015-06-06: qty 5

## 2015-06-06 MED ORDER — NEOSTIGMINE METHYLSULFATE 10 MG/10ML IV SOLN
INTRAVENOUS | Status: AC
  Filled 2015-06-06: qty 1

## 2015-06-06 MED ORDER — ONDANSETRON 4 MG PO TBDP
4.0000 mg | ORAL_TABLET | Freq: Four times a day (QID) | ORAL | Status: DC | PRN
  Filled 2015-06-06: qty 1

## 2015-06-06 MED ORDER — LACTATED RINGERS IV SOLN: INTRAVENOUS | Status: DC

## 2015-06-06 MED ORDER — NEOSTIGMINE METHYLSULFATE 10 MG/10ML IV SOLN
INTRAVENOUS | Status: DC | PRN
  Administered 2015-06-06: 3 mg via INTRAVENOUS

## 2015-06-06 MED ORDER — OXYCODONE HCL 5 MG PO TABS
5.0000 mg | ORAL_TABLET | ORAL | Status: DC | PRN
  Administered 2015-06-06 – 2015-06-07 (×2): 10 mg via ORAL
  Filled 2015-06-06 (×2): qty 2

## 2015-06-06 SURGICAL SUPPLY — 61 items
BAG DECANTER FOR FLEXI CONT (MISCELLANEOUS) ×2 IMPLANT
BINDER BREAST LRG (GAUZE/BANDAGES/DRESSINGS) ×1 IMPLANT
BINDER BREAST XLRG (GAUZE/BANDAGES/DRESSINGS) IMPLANT
CANISTER SUCTION 2500CC (MISCELLANEOUS) ×2 IMPLANT
CHLORAPREP W/TINT 26ML (MISCELLANEOUS) ×2 IMPLANT
COVER SURGICAL LIGHT HANDLE (MISCELLANEOUS) ×2 IMPLANT
DRAIN CHANNEL 15F RND FF W/TCR (WOUND CARE) ×2 IMPLANT
DRAPE ORTHO SPLIT 77X108 STRL (DRAPES) ×4
DRAPE PROXIMA HALF (DRAPES) IMPLANT
DRAPE SURG ORHT 6 SPLT 77X108 (DRAPES) ×2 IMPLANT
DRAPE WARM FLUID 44X44 (DRAPE) ×2 IMPLANT
DRSG PAD ABDOMINAL 8X10 ST (GAUZE/BANDAGES/DRESSINGS) ×4 IMPLANT
DRSG TEGADERM 2-3/8X2-3/4 SM (GAUZE/BANDAGES/DRESSINGS) ×1 IMPLANT
DRSG TEGADERM 4X4.75 (GAUZE/BANDAGES/DRESSINGS) ×5 IMPLANT
ELECT BLADE 4.0 EZ CLEAN MEGAD (MISCELLANEOUS) ×2
ELECT CAUTERY BLADE 6.4 (BLADE) ×1 IMPLANT
ELECT COATED BLADE 2.86 ST (ELECTRODE) ×1 IMPLANT
ELECT REM PT RETURN 9FT ADLT (ELECTROSURGICAL) ×2
ELECTRODE BLDE 4.0 EZ CLN MEGD (MISCELLANEOUS) ×1 IMPLANT
ELECTRODE REM PT RTRN 9FT ADLT (ELECTROSURGICAL) ×1 IMPLANT
EVACUATOR SILICONE 100CC (DRAIN) ×2 IMPLANT
GAUZE SPONGE 4X4 12PLY STRL (GAUZE/BANDAGES/DRESSINGS) ×2 IMPLANT
GLOVE BIO SURGEON STRL SZ 6 (GLOVE) ×6 IMPLANT
GLOVE BIOGEL PI IND STRL 6.5 (GLOVE) IMPLANT
GLOVE BIOGEL PI IND STRL 7.0 (GLOVE) IMPLANT
GLOVE BIOGEL PI INDICATOR 6.5 (GLOVE) ×1
GLOVE BIOGEL PI INDICATOR 7.0 (GLOVE) ×1
GLOVE ECLIPSE 6.5 STRL STRAW (GLOVE) ×1 IMPLANT
GOWN STRL REUS W/ TWL LRG LVL3 (GOWN DISPOSABLE) ×2 IMPLANT
GOWN STRL REUS W/TWL LRG LVL3 (GOWN DISPOSABLE) ×12
IMPLANT BREAST SALINE 175CC (Breast) ×1 IMPLANT
IMPLANT SALINE TEXTURED 225CC (Breast) ×1 IMPLANT
KIT BASIN OR (CUSTOM PROCEDURE TRAY) ×2 IMPLANT
KIT ROOM TURNOVER OR (KITS) ×2 IMPLANT
LIQUID BAND (GAUZE/BANDAGES/DRESSINGS) ×3 IMPLANT
MARKER SKIN DUAL TIP RULER LAB (MISCELLANEOUS) ×1 IMPLANT
MENTOR CONTOUR PROFILE SINGLE USE SALINE BREAST IMPLANT  SIZER ×1 IMPLANT
MENTOR ROUND MODERATE PROFILE SINGLE USE SALINE BREAST IMPLANT SIZER ×1 IMPLANT
NS IRRIG 1000ML POUR BTL (IV SOLUTION) ×4 IMPLANT
PACK GENERAL/GYN (CUSTOM PROCEDURE TRAY) ×2 IMPLANT
PAD ABD 8X10 STRL (GAUZE/BANDAGES/DRESSINGS) ×1 IMPLANT
PAD ARMBOARD 7.5X6 YLW CONV (MISCELLANEOUS) ×2 IMPLANT
PIN SAFETY STERILE (MISCELLANEOUS) ×2 IMPLANT
SET ASEPTIC TRANSFER (MISCELLANEOUS) ×2 IMPLANT
SIZER BREAST 175CC (SIZER) ×2
SIZER BREAST 225CC (SIZER) ×2
SIZER BREAST SGL USE 275CC (SIZER) ×1 IMPLANT
SIZER BRST 175CC (SIZER) ×1
SIZER BRST 175CC SAL 1 USE (SIZER) IMPLANT
SIZER BRST 225CC (SIZER) IMPLANT
SOLUTION BETADINE 4OZ (MISCELLANEOUS) ×1 IMPLANT
STAPLER VISISTAT 35W (STAPLE) ×1 IMPLANT
SUT ETHILON 2 0 FS 18 (SUTURE) ×2 IMPLANT
SUT MNCRL AB 4-0 PS2 18 (SUTURE) ×2 IMPLANT
SUT VIC AB 3-0 SH 27 (SUTURE) ×6
SUT VIC AB 3-0 SH 27X BRD (SUTURE) IMPLANT
SUT VICRYL 4-0 PS2 18IN ABS (SUTURE) ×1 IMPLANT
SYR BULB IRRIGATION 50ML (SYRINGE) ×1 IMPLANT
TOWEL OR 17X24 6PK STRL BLUE (TOWEL DISPOSABLE) ×2 IMPLANT
TOWEL OR 17X26 10 PK STRL BLUE (TOWEL DISPOSABLE) ×1 IMPLANT
TRAY FOLEY CATH 16FRSI W/METER (SET/KITS/TRAYS/PACK) IMPLANT

## 2015-06-06 NOTE — Interval H&P Note (Signed)
History and Physical Interval Note:  06/06/2015 8:18 AM  Courtney Terry  has presented today for surgery, with the diagnosis of acquired abscence of right breast nd history of breast cancer  The various methods of treatment have been discussed with the patient and family. After consideration of risks, benefits and other options for treatment, the patient has consented to  Procedure(s): REMOVAL OF RIGHT BREAST TISSUE EXPANDER AND PLACEMENT OF IMPLANT WITH LEFT BREAST AUGMENTATION FOR SYMETRY (Right) as a surgical intervention .  The patient's history has been reviewed, patient examined, no change in status, stable for surgery.  I have reviewed the patient's chart and labs.  Questions were answered to the patient's satisfaction.     Tieshia Rettinger

## 2015-06-06 NOTE — Op Note (Signed)
Operative Note   DATE OF OPERATION: 8.30.2016  LOCATION: [facility] - observation  SURGICAL DIVISION: Plastic Surgery  PREOPERATIVE DIAGNOSES:  1. History right breast cancer 2. Acquired absence right breast 3. Asymmetry native and reconstructed breast  POSTOPERATIVE DIAGNOSES:  same  PROCEDURE:  1. Removal right tissue expander and placement saline breast implant 2. Left breast augmentation with saline for symmetry  SURGEON: Irene Limbo MD MBA  ASSISTANT: none  ANESTHESIA:  General.   EBL: 40 ml  COMPLICATIONS: None.   INDICATIONS FOR PROCEDURE:  The patient, Courtney Terry, is a 54 y.o. female born on [birth year], is here for second stage reconstruction following nipple sparing mastectomy and expander based reconstruction on right.   FINDINGS: Mentor Siltex Saline Round Moderate Profile implants Left 175 ml, filled to 200 ml. Ref [id number], SN [id number] Right 225 ml, fill volume 225 ml. Ref [id number] SN [id number]  DESCRIPTION OF PROCEDURE:  The patient's operative site was marked with the patient in the preoperative area in standing position to mark breast meridians, anterior axillary lines, sternal notch and chest midline The patient was taken to the operating room. SCDs were placed and IV antibiotics were given. The patient's operative site was prepped and draped in a sterile fashion. A time out was performed and all information was confirmed to be correct. I began on right breast with incision in inframammary fold scar. Capsule entered and expander removed. Capsulotomies performed medially and superiorly. Limited capsulectomy performed over superior and medial poles to aid with adherence of textured device. Multiple sizers placed. I then directed attention to left breast with incision in inframammary fold. Incision carried through superficial fascia and anterolateral border of pectoralis identified. Submuscular dissection completed with limited division of inferior insertion  of pectoralis. Dual plane dissection completed to level of nipple on left. Sizer placed and tailor tacked. Patient brought to sitting position. Patient assessed for symmetry final moderate profile, round 175 ml for left and 225 ml for right was selected.  Patient was returned to supine position and cavities irrigated with solution containing Ancef, bacitracin, and gentamicin. Hemostasis ensured. 15 Fr JP drain placed in each cavity. Final implants placed and filled to 200 ml on left and 225 ml over right. Closure completed with 3-0 vicryl to close capsule on right and superficial fascia on left. Additional a 3-0 vicryl placed on left to tack superficial fascia to chest wall to stabilize inframammary fold. 4-0 vicryl used in dermis and skin closure completed with 4-0 monocryl subcuticular. Tissue adhesive applied followed by dry dressing and breast binder.   The patient was allowed to wake from anesthesia, extubated and taken to the recovery room in satisfactory condition.   SPECIMENS: none  DRAINS: 15 Fr JP in right and left breast  Irene Limbo, MD [facility] Plastic & Reconstructive Surgery [phone]

## 2015-06-06 NOTE — Anesthesia Procedure Notes (Signed)
Procedure Name: Intubation Date/Time: 06/06/2015 11:17 AM Performed by: Willeen Cass P Pre-anesthesia Checklist: Patient identified, Timeout performed, Emergency Drugs available, Suction available and Patient being monitored Patient Re-evaluated:Patient Re-evaluated prior to inductionOxygen Delivery Method: Circle system utilized Preoxygenation: Pre-oxygenation with 100% oxygen Intubation Type: IV induction Ventilation: Mask ventilation without difficulty and Oral airway inserted - appropriate to patient size Laryngoscope Size: Mac and 3 Grade View: Grade I Tube type: Oral Tube size: 7.0 mm Number of attempts: 1 Airway Equipment and Method: Stylet and Oral airway Placement Confirmation: ETT inserted through vocal cords under direct vision,  breath sounds checked- equal and bilateral and positive ETCO2 Secured at: 20 cm Tube secured with: Tape Dental Injury: Teeth and Oropharynx as per pre-operative assessment

## 2015-06-06 NOTE — Transfer of Care (Signed)
Immediate Anesthesia Transfer of Care Note  Patient: Courtney Terry  Procedure(s) Performed: Procedure(s): REMOVAL OF RIGHT BREAST TISSUE EXPANDER AND PLACEMENT OF IMPLANT WITH LEFT BREAST AUGMENTATION FOR SYMETRY (Right)  Patient Location: PACU  Anesthesia Type:General  Level of Consciousness: awake, alert , oriented and patient cooperative  Airway & Oxygen Therapy: Patient Spontanous Breathing and Patient connected to nasal cannula oxygen  Post-op Assessment: Report given to RN and Post -op Vital signs reviewed and stable  Post vital signs: Reviewed and stable  Last Vitals:  Filed Vitals:   06/06/15 0923  BP: 114/87  Pulse: 75  Temp: 36.5 C  Resp: 18    Complications: No apparent anesthesia complications

## 2015-06-06 NOTE — Anesthesia Preprocedure Evaluation (Addendum)
Anesthesia Evaluation  Patient identified by MRN, date of birth, ID band Patient awake    Reviewed: Allergy & Precautions, NPO status , Patient's Chart, lab work & pertinent test results  Airway Mallampati: I  TM Distance: >3 FB Neck ROM: Full    Dental  (+) Upper Dentures   Pulmonary asthma , COPD oxygen dependent, Current Smoker,  breath sounds clear to auscultation  - decreased breath sounds      Cardiovascular negative cardio ROS  Rhythm:Regular Rate:Normal     Neuro/Psych PSYCHIATRIC DISORDERS Anxiety Depression Bipolar Disorder Schizophrenia    GI/Hepatic negative GI ROS, Neg liver ROS,   Endo/Other  Hypothyroidism   Renal/GU negative Renal ROS  negative genitourinary   Musculoskeletal  (+) Arthritis -, Osteoarthritis,    Abdominal   Peds negative pediatric ROS (+)  Hematology   Anesthesia Other Findings   Reproductive/Obstetrics negative OB ROS                           Lab Results  Component Value Date   WBC 6.9 04/24/2015   HGB 14.9 04/24/2015   HCT 46.6 04/24/2015   MCV 85.7 04/24/2015   PLT 289 04/24/2015   Lab Results  Component Value Date   CREATININE 0.8 04/24/2015   BUN 13.7 04/24/2015   NA 143 04/24/2015   K 4.9 04/24/2015   CL 105 03/14/2015   CO2 30* 04/24/2015   No results found for: INR, PROTIME  EKG: sinus tachycardia.  Anesthesia Physical Anesthesia Plan  ASA: III  Anesthesia Plan: General   Post-op Pain Management: GA combined w/ Regional for post-op pain   Induction: Intravenous  Airway Management Planned: Oral ETT  Additional Equipment:   Intra-op Plan:   Post-operative Plan: Extubation in OR  Informed Consent: I have reviewed the patients History and Physical, chart, labs and discussed the procedure including the risks, benefits and alternatives for the proposed anesthesia with the patient or authorized representative who has indicated  his/her understanding and acceptance.   Dental advisory given  Plan Discussed with: CRNA  Anesthesia Plan Comments: (Ms. Hack has declined a Pectoral Block. She states it did not provide relief after her previous surgery.   Multimodal Pain Therapy: Acetaminophen, Ketorolac & Opioids)      Anesthesia Quick Evaluation

## 2015-06-06 NOTE — Progress Notes (Signed)
Pt admitted to 6N03 via bed from PACU.  Pt AAOX4.  Pt on 2L O2 via [place].  Pt has 18G to Lt FA with fluids infusing.  Pt has incision to bilat breast with abd pads and abd binder, with bilat jp drains to bilat breast.  SCDs in place. Pt has no complaints at the moment.  Belongings and family to bedside.  Report rcvd from [place], [place].  Will continue to monitor.

## 2015-06-07 ENCOUNTER — Encounter (HOSPITAL_COMMUNITY): Payer: Self-pay | Admitting: Plastic Surgery

## 2015-06-07 DIAGNOSIS — Z421 Encounter for breast reconstruction following mastectomy: Secondary | ICD-10-CM | POA: Diagnosis not present

## 2015-06-07 LAB — BASIC METABOLIC PANEL
ANION GAP: 6 (ref 5–15)
BUN: 14 mg/dL (ref 6–20)
CHLORIDE: 106 mmol/L (ref 101–111)
CO2: 26 mmol/L (ref 22–32)
Calcium: 8.9 mg/dL (ref 8.9–10.3)
Creatinine, Ser: 0.83 mg/dL (ref 0.44–1.00)
GFR calc Af Amer: >60 mL/min (ref >60)
GFR calc non Af Amer: >60 mL/min (ref >60)
GLUCOSE: 103 mg/dL — AB (ref 65–99)
POTASSIUM: 4.2 mmol/L (ref 3.5–5.1)
Sodium: 138 mmol/L (ref 135–145)

## 2015-06-07 LAB — CBC
HEMATOCRIT: 36.4 % (ref 36.0–46.0)
Hemoglobin: 11.8 g/dL — ABNORMAL LOW (ref 12.0–15.0)
MCH: 27.6 pg (ref 26.0–34.0)
MCHC: 32.4 g/dL (ref 30.0–36.0)
MCV: 85 fL (ref 78.0–100.0)
Platelets: 236 10*3/uL (ref 150–400)
RBC: 4.28 MIL/uL (ref 3.87–5.11)
RDW: 15.6 % — AB (ref 11.5–15.5)
WBC: 9.7 10*3/uL (ref 4.0–10.5)

## 2015-06-07 MED ORDER — OXYCODONE-ACETAMINOPHEN 10-325 MG PO TABS: 1.0000 | ORAL_TABLET | ORAL | Status: DC | PRN

## 2015-06-07 MED ORDER — MORPHINE SULFATE (PF) 2 MG/ML IV SOLN: 1.0000 mg | INTRAVENOUS | Status: DC | PRN

## 2015-06-07 MED ORDER — SULFAMETHOXAZOLE-TRIMETHOPRIM 800-160 MG PO TABS: 1.0000 | ORAL_TABLET | Freq: Two times a day (BID) | ORAL | Status: DC

## 2015-06-07 MED ORDER — OXYCODONE-ACETAMINOPHEN 5-325 MG PO TABS: 1.0000 | ORAL_TABLET | ORAL | Status: DC | PRN

## 2015-06-07 MED ORDER — OXYCODONE-ACETAMINOPHEN 7.5-325 MG PO TABS: 1.0000 | ORAL_TABLET | ORAL | Status: DC | PRN

## 2015-06-07 MED ORDER — OXYCODONE HCL 5 MG PO TABS
2.5000 mg | ORAL_TABLET | ORAL | Status: DC | PRN
Start: 2015-06-07 — End: 2015-06-07

## 2015-06-07 NOTE — Progress Notes (Signed)
Patient is very agitated about getting home.  She is yelling inside of her room and cussing.  Patient had threatened to walk out without instructions.   Encouraged patient to wait for the instructions.   AVS is not complete and cannot be printed until finished by the physician.  Physician has been notified of the issue and stated she would call back.  Patient had been given instructions for her drain, prescription, and supplies.  We told her to follow up with the physcian and gave her the appointment date.

## 2015-06-07 NOTE — Discharge Summary (Signed)
Physician Discharge Summary  Patient ID: Courtney Terry MRN: [id number] DOB/AGE: 1961-05-23 54 y.o.  Admit date: 06/06/2015 Discharge date: 06/07/2015  Admission Diagnoses: acquired absence breast, personal history breast cancer, asymmetry native and reconstructed breast  Discharge Diagnoses:  acquired absence breast, personal history breast cancer, asymmetry native and reconstructed breast  Discharged Condition: stable  Hospital Course: Post operatively patient maintained on IV antibiotics for plan oral antibiotics upon discharge for prophylaxis give implant based reconstruction. Patient stated that 5mg  oxycodone not effective and requested 10/325 Percocet. This was ordered for discharge but not available as inpatient. Instructed on drain care, implant care provided. Patient quite agitated am POD#1 but able to be calmed, agitation regarding desired change in pain medication.   Treatments: surgery: removal right tissue expander and placement implant, left breast augmentation for symmetry  Discharge Exam: Blood pressure 101/66, pulse 87, temperature 98.2 F (36.8 C), temperature source Oral, resp. rate 16, height 5\' 10"  (1.778 m), weight 52.254 kg (115 lb 3.2 oz), SpO2 100 %. Incision/Wound: incisions dry, intact, breasts soft, no hematoma  Disposition: 01-Home or Self Care  Discharge Instructions    Call MD for:  redness, tenderness, or signs of infection (pain, swelling, bleeding, redness, odor or green/yellow discharge around incision site)    Complete by:  As directed      Call MD for:  severe or increased pain, loss or decreased feeling  in affected limb(s)    Complete by:  As directed      Discharge instructions    Complete by:  As directed   [place] to shower am 06/08/15. Pat incisions dry. Ok to raise arms above shoulders to dress, bathe  Breast binder or soft compression bra all times.  Strip and record drain log twice daily. BRING LOG TO CLINIC VISIT  Ice packs for comfort to  chest     Driving Restrictions    Complete by:  As directed   No driving while taking narcotics     Lifting restrictions    Complete by:  As directed   No lifting greater than 5 lbs     Resume previous diet    Complete by:  As directed             Medication List    TAKE these medications        albuterol (2.5 MG/3ML) 0.083% nebulizer solution  Commonly known as:  PROVENTIL  Take 3 mLs (2.5 mg total) by nebulization every 4 (four) hours as needed for wheezing or shortness of breath.     PROAIR HFA 108 (90 BASE) MCG/ACT inhaler  Generic drug:  albuterol  INHALE 2 PUFFS INTO THE LUNGS EVERY 4 HOURS AS NEEDED FOR WHEEZING     anastrozole 1 MG tablet  Commonly known as:  ARIMIDEX  Take 1 tablet (1 mg total) by mouth daily.     divalproex 500 MG DR tablet  Commonly known as:  DEPAKOTE  Take 1,000 mg by mouth 2 (two) times daily.     DULERA 200-5 MCG/ACT Aero  Generic drug:  mometasone-formoterol  INHALE 2 PUFFS INTO THE LUNGS TWICE A DAY     FLUoxetine 40 MG capsule  Commonly known as:  PROZAC  Take 40 mg by mouth daily.     levothyroxine 75 MCG tablet  Commonly known as:  SYNTHROID, LEVOTHROID  Take 75 mcg by mouth daily.     oxyCODONE-acetaminophen 10-325 MG per tablet  Commonly known as:  PERCOCET  Take 1 tablet by mouth  every 4 (four) hours as needed for pain.     sulfamethoxazole-trimethoprim 800-160 MG per tablet  Commonly known as:  BACTRIM DS,SEPTRA DS  Take 1 tablet by mouth 2 (two) times daily.           Follow-up Information    Follow up with Irene Limbo, MD On 06/14/2015.   Specialty:  Plastic Surgery   Why:  1 pm   Contact information:   [address] [place] [address] [phone]       Signed: Irene Limbo 06/07/2015, 8:01 AM

## 2015-06-07 NOTE — Anesthesia Postprocedure Evaluation (Signed)
  Anesthesia Post-op Note  Patient: Courtney Terry  Procedure(s) Performed: Procedure(s): REMOVAL OF RIGHT BREAST TISSUE EXPANDER AND PLACEMENT OF IMPLANT WITH LEFT BREAST AUGMENTATION FOR SYMETRY (Right)  Patient Location: Floor  Anesthesia Type:General  Level of Consciousness: awake and alert   Airway and Oxygen Therapy: Patient Spontanous Breathing  Post-op Pain: mild  Post-op Assessment: Post-op Vital signs reviewed and Patient's Cardiovascular Status Stable              Post-op Vital Signs: Reviewed and stable  Last Vitals:  Filed Vitals:   06/07/15 0637  BP: 101/66  Pulse: 87  Temp: 36.8 C  Resp: 16    Complications: No apparent anesthesia complications

## 2015-06-07 NOTE — Progress Notes (Signed)
Patient received AVS and was discharged.

## 2015-06-13 ENCOUNTER — Encounter (HOSPITAL_COMMUNITY): Payer: Self-pay | Admitting: Plastic Surgery

## 2015-06-19 ENCOUNTER — Observation Stay (HOSPITAL_COMMUNITY): Payer: Medicaid Other | Admitting: Certified Registered Nurse Anesthetist

## 2015-06-19 ENCOUNTER — Encounter (HOSPITAL_COMMUNITY)
Admission: AD | Disposition: A | Discharge status: Home / Self Care | Payer: Self-pay | Source: Ambulatory Visit | Attending: Plastic Surgery

## 2015-06-19 ENCOUNTER — Observation Stay (HOSPITAL_COMMUNITY)
Admission: AD | Admit: 2015-06-19 | Discharge: 2015-06-21 | Disposition: A | Discharge status: Home / Self Care | Payer: Medicaid Other | Source: Ambulatory Visit | Attending: Plastic Surgery | Admitting: Plastic Surgery

## 2015-06-19 ENCOUNTER — Encounter (HOSPITAL_COMMUNITY): Payer: Self-pay | Admitting: Physician Assistant

## 2015-06-19 DIAGNOSIS — Z9011 Acquired absence of right breast and nipple: Secondary | ICD-10-CM | POA: Insufficient documentation

## 2015-06-19 DIAGNOSIS — J45909 Unspecified asthma, uncomplicated: Secondary | ICD-10-CM | POA: Insufficient documentation

## 2015-06-19 DIAGNOSIS — T8589XA Other specified complication of internal prosthetic devices, implants and grafts, not elsewhere classified, initial encounter: Principal | ICD-10-CM | POA: Insufficient documentation

## 2015-06-19 DIAGNOSIS — T8131XA Disruption of external operation (surgical) wound, not elsewhere classified, initial encounter: Secondary | ICD-10-CM | POA: Diagnosis not present

## 2015-06-19 DIAGNOSIS — Z23 Encounter for immunization: Secondary | ICD-10-CM | POA: Insufficient documentation

## 2015-06-19 DIAGNOSIS — Z853 Personal history of malignant neoplasm of breast: Secondary | ICD-10-CM | POA: Diagnosis not present

## 2015-06-19 DIAGNOSIS — T8549XA Other mechanical complication of breast prosthesis and implant, initial encounter: Secondary | ICD-10-CM | POA: Diagnosis present

## 2015-06-19 DIAGNOSIS — Z9981 Dependence on supplemental oxygen: Secondary | ICD-10-CM | POA: Diagnosis not present

## 2015-06-19 DIAGNOSIS — F319 Bipolar disorder, unspecified: Secondary | ICD-10-CM | POA: Insufficient documentation

## 2015-06-19 DIAGNOSIS — E039 Hypothyroidism, unspecified: Secondary | ICD-10-CM | POA: Insufficient documentation

## 2015-06-19 DIAGNOSIS — F17210 Nicotine dependence, cigarettes, uncomplicated: Secondary | ICD-10-CM | POA: Diagnosis not present

## 2015-06-19 DIAGNOSIS — Z9882 Breast implant status: Secondary | ICD-10-CM | POA: Diagnosis not present

## 2015-06-19 DIAGNOSIS — J449 Chronic obstructive pulmonary disease, unspecified: Secondary | ICD-10-CM | POA: Insufficient documentation

## 2015-06-19 DIAGNOSIS — T83728A Exposure of other implanted mesh and other prosthetic materials to surrounding organ or tissue, initial encounter: Secondary | ICD-10-CM | POA: Diagnosis present

## 2015-06-19 DIAGNOSIS — Y834 Other reconstructive surgery as the cause of abnormal reaction of the patient, or of later complication, without mention of misadventure at the time of the procedure: Secondary | ICD-10-CM | POA: Insufficient documentation

## 2015-06-19 DIAGNOSIS — Z9010 Acquired absence of unspecified breast and nipple: Secondary | ICD-10-CM

## 2015-06-19 HISTORY — PX: BREAST IMPLANT REMOVAL: SHX5361

## 2015-06-19 LAB — SURGICAL PCR SCREEN
MRSA, PCR: POSITIVE — AB
STAPHYLOCOCCUS AUREUS: POSITIVE — AB

## 2015-06-19 SURGERY — REMOVAL, IMPLANT, BREAST
Anesthesia: General | Site: Breast | Laterality: Right

## 2015-06-19 MED ORDER — SODIUM CHLORIDE 0.9 % IV SOLN
INTRAVENOUS | Status: DC
  Administered 2015-06-19: 16:00:00 via INTRAVENOUS

## 2015-06-19 MED ORDER — PHENYLEPHRINE HCL 10 MG/ML IJ SOLN
INTRAMUSCULAR | Status: DC | PRN
Start: 2015-06-19 — End: 2015-06-19
  Administered 2015-06-19 (×3): 80 ug via INTRAVENOUS
  Administered 2015-06-19: 40 ug via INTRAVENOUS
  Administered 2015-06-19: 80 ug via INTRAVENOUS
  Administered 2015-06-19: 120 ug via INTRAVENOUS

## 2015-06-19 MED ORDER — PROPOFOL 10 MG/ML IV BOLUS
INTRAVENOUS | Status: DC | PRN
  Administered 2015-06-19: 150 mg via INTRAVENOUS
  Administered 2015-06-19: 20 mg via INTRAVENOUS

## 2015-06-19 MED ORDER — MIDAZOLAM HCL 2 MG/2ML IJ SOLN
INTRAMUSCULAR | Status: AC
  Filled 2015-06-19: qty 4

## 2015-06-19 MED ORDER — PROMETHAZINE HCL 25 MG/ML IJ SOLN: 6.2500 mg | INTRAMUSCULAR | Status: DC | PRN

## 2015-06-19 MED ORDER — OXYCODONE HCL 5 MG/5ML PO SOLN: 5.0000 mg | Freq: Once | ORAL | Status: DC | PRN

## 2015-06-19 MED ORDER — LORAZEPAM 1 MG PO TABS
1.0000 mg | ORAL_TABLET | ORAL | Status: DC | PRN
  Administered 2015-06-19: 1 mg via ORAL
  Filled 2015-06-19: qty 1

## 2015-06-19 MED ORDER — OXYCODONE-ACETAMINOPHEN 10-325 MG PO TABS: 1.0000 | ORAL_TABLET | ORAL | Status: DC | PRN

## 2015-06-19 MED ORDER — LACTATED RINGERS IV SOLN
INTRAVENOUS | Status: DC
  Administered 2015-06-19 (×2): via INTRAVENOUS

## 2015-06-19 MED ORDER — INFLUENZA VAC SPLIT QUAD 0.5 ML IM SUSY
0.5000 mL | PREFILLED_SYRINGE | INTRAMUSCULAR | Status: AC
  Administered 2015-06-20: 0.5 mL via INTRAMUSCULAR
  Filled 2015-06-19: qty 0.5

## 2015-06-19 MED ORDER — CEFAZOLIN SODIUM-DEXTROSE 2-3 GM-% IV SOLR
INTRAVENOUS | Status: DC | PRN
  Administered 2015-06-19: 2 g via INTRAVENOUS

## 2015-06-19 MED ORDER — ONDANSETRON 4 MG PO TBDP: 4.0000 mg | ORAL_TABLET | Freq: Four times a day (QID) | ORAL | Status: DC | PRN

## 2015-06-19 MED ORDER — CEFAZOLIN SODIUM 1-5 GM-% IV SOLN
1.0000 g | Freq: Three times a day (TID) | INTRAVENOUS | Status: DC
  Administered 2015-06-20 (×2): 1 g via INTRAVENOUS
  Filled 2015-06-19 (×3): qty 50

## 2015-06-19 MED ORDER — ONDANSETRON HCL 4 MG/2ML IJ SOLN
INTRAMUSCULAR | Status: DC | PRN
  Administered 2015-06-19: 4 mg via INTRAVENOUS

## 2015-06-19 MED ORDER — OXYCODONE HCL 5 MG PO TABS
5.0000 mg | ORAL_TABLET | ORAL | Status: DC | PRN
  Administered 2015-06-19: 10 mg via ORAL
  Filled 2015-06-19: qty 2

## 2015-06-19 MED ORDER — ALBUTEROL SULFATE (2.5 MG/3ML) 0.083% IN NEBU: 2.5000 mg | INHALATION_SOLUTION | RESPIRATORY_TRACT | Status: DC | PRN

## 2015-06-19 MED ORDER — OXYCODONE-ACETAMINOPHEN 5-325 MG PO TABS
1.0000 | ORAL_TABLET | ORAL | Status: DC | PRN
  Administered 2015-06-20 – 2015-06-21 (×4): 2 via ORAL
  Filled 2015-06-19 (×4): qty 2

## 2015-06-19 MED ORDER — LEVOTHYROXINE SODIUM 50 MCG PO TABS
75.0000 ug | ORAL_TABLET | Freq: Every day | ORAL | Status: DC
  Administered 2015-06-20 – 2015-06-21 (×2): 75 ug via ORAL
  Filled 2015-06-19 (×4): qty 1

## 2015-06-19 MED ORDER — SODIUM CHLORIDE 0.9 % IR SOLN
Status: DC | PRN
  Administered 2015-06-19: 1000 mL

## 2015-06-19 MED ORDER — ANASTROZOLE 1 MG PO TABS
1.0000 mg | ORAL_TABLET | Freq: Every day | ORAL | Status: DC
  Administered 2015-06-20 – 2015-06-21 (×2): 1 mg via ORAL
  Filled 2015-06-19 (×3): qty 1

## 2015-06-19 MED ORDER — KCL IN DEXTROSE-NACL 20-5-0.45 MEQ/L-%-% IV SOLN
INTRAVENOUS | Status: DC
  Administered 2015-06-19 – 2015-06-21 (×3): via INTRAVENOUS
  Filled 2015-06-19 (×3): qty 1000

## 2015-06-19 MED ORDER — FENTANYL CITRATE (PF) 100 MCG/2ML IJ SOLN
INTRAMUSCULAR | Status: DC | PRN
  Administered 2015-06-19: 50 ug via INTRAVENOUS
  Administered 2015-06-19: 75 ug via INTRAVENOUS

## 2015-06-19 MED ORDER — PROPOFOL 10 MG/ML IV BOLUS
INTRAVENOUS | Status: AC
  Filled 2015-06-19: qty 20

## 2015-06-19 MED ORDER — FENTANYL CITRATE (PF) 100 MCG/2ML IJ SOLN: 25.0000 ug | INTRAMUSCULAR | Status: DC | PRN

## 2015-06-19 MED ORDER — DIVALPROEX SODIUM 500 MG PO DR TAB
1000.0000 mg | DELAYED_RELEASE_TABLET | Freq: Two times a day (BID) | ORAL | Status: DC
  Administered 2015-06-20 – 2015-06-21 (×3): 1000 mg via ORAL
  Filled 2015-06-19 (×5): qty 2

## 2015-06-19 MED ORDER — HYDROMORPHONE HCL 1 MG/ML IJ SOLN
0.5000 mg | INTRAMUSCULAR | Status: DC | PRN
  Administered 2015-06-19: 1 mg via INTRAVENOUS
  Filled 2015-06-19: qty 1

## 2015-06-19 MED ORDER — FLUOXETINE HCL 20 MG PO CAPS
40.0000 mg | ORAL_CAPSULE | Freq: Every day | ORAL | Status: DC
  Administered 2015-06-19 – 2015-06-21 (×3): 40 mg via ORAL
  Filled 2015-06-19 (×3): qty 2

## 2015-06-19 MED ORDER — FENTANYL CITRATE (PF) 250 MCG/5ML IJ SOLN
INTRAMUSCULAR | Status: AC
  Filled 2015-06-19: qty 5

## 2015-06-19 MED ORDER — ONDANSETRON HCL 4 MG/2ML IJ SOLN
4.0000 mg | Freq: Four times a day (QID) | INTRAMUSCULAR | Status: DC | PRN
  Administered 2015-06-20: 4 mg via INTRAVENOUS

## 2015-06-19 MED ORDER — MIDAZOLAM HCL 5 MG/5ML IJ SOLN
INTRAMUSCULAR | Status: DC | PRN
  Administered 2015-06-19: 1 mg via INTRAVENOUS

## 2015-06-19 MED ORDER — OXYCODONE HCL 5 MG PO TABS: 5.0000 mg | ORAL_TABLET | Freq: Once | ORAL | Status: DC | PRN

## 2015-06-19 MED ORDER — MOMETASONE FURO-FORMOTEROL FUM 200-5 MCG/ACT IN AERO
2.0000 | INHALATION_SPRAY | Freq: Two times a day (BID) | RESPIRATORY_TRACT | Status: DC
  Administered 2015-06-19 – 2015-06-21 (×4): 2 via RESPIRATORY_TRACT
  Filled 2015-06-19: qty 8.8

## 2015-06-19 MED ORDER — ROCURONIUM BROMIDE 50 MG/5ML IV SOLN
INTRAVENOUS | Status: AC
  Filled 2015-06-19: qty 1

## 2015-06-19 SURGICAL SUPPLY — 35 items
ADH SKN CLS LQ APL DERMABOND (GAUZE/BANDAGES/DRESSINGS) ×1
BANDAGE ELASTIC 4 VELCRO ST LF (GAUZE/BANDAGES/DRESSINGS) IMPLANT
BINDER BREAST MEDIUM (GAUZE/BANDAGES/DRESSINGS) ×1 IMPLANT
BLADE 10 SAFETY STRL DISP (BLADE) ×2 IMPLANT
BLADE SURG ROTATE 9660 (MISCELLANEOUS) IMPLANT
BNDG GAUZE ELAST 4 BULKY (GAUZE/BANDAGES/DRESSINGS) IMPLANT
CANISTER SUCTION 2500CC (MISCELLANEOUS) ×2 IMPLANT
CHLORAPREP W/TINT 26ML (MISCELLANEOUS) IMPLANT
CLSR STERI-STRIP ANTIMIC 1/2X4 (GAUZE/BANDAGES/DRESSINGS) ×1 IMPLANT
COVER SURGICAL LIGHT HANDLE (MISCELLANEOUS) ×2 IMPLANT
DERMABOND ADHESIVE PROPEN (GAUZE/BANDAGES/DRESSINGS) ×1
DERMABOND ADVANCED .7 DNX6 (GAUZE/BANDAGES/DRESSINGS) IMPLANT
DRAIN CHANNEL 15F RND FF W/TCR (WOUND CARE) ×1 IMPLANT
DRAPE EXTREMITY T 121X128X90 (DRAPE) IMPLANT
DRAPE ORTHO SPLIT 77X108 STRL (DRAPES)
DRAPE SURG ORHT 6 SPLT 77X108 (DRAPES) IMPLANT
DRSG PAD ABDOMINAL 8X10 ST (GAUZE/BANDAGES/DRESSINGS) ×3 IMPLANT
ELECT REM PT RETURN 9FT ADLT (ELECTROSURGICAL) ×2
ELECTRODE REM PT RTRN 9FT ADLT (ELECTROSURGICAL) ×1 IMPLANT
EVACUATOR SILICONE 100CC (DRAIN) ×2 IMPLANT
GAUZE SPONGE 4X4 12PLY STRL (GAUZE/BANDAGES/DRESSINGS) IMPLANT
GLOVE BIO SURGEON STRL SZ 6 (GLOVE) ×2 IMPLANT
GOWN STRL REUS W/ TWL LRG LVL3 (GOWN DISPOSABLE) ×2 IMPLANT
GOWN STRL REUS W/TWL LRG LVL3 (GOWN DISPOSABLE) ×4
HANDPIECE INTERPULSE COAX TIP (DISPOSABLE)
KIT BASIN OR (CUSTOM PROCEDURE TRAY) ×2 IMPLANT
KIT ROOM TURNOVER OR (KITS) ×2 IMPLANT
NS IRRIG 1000ML POUR BTL (IV SOLUTION) ×2 IMPLANT
PACK GENERAL/GYN (CUSTOM PROCEDURE TRAY) ×2 IMPLANT
PAD ARMBOARD 7.5X6 YLW CONV (MISCELLANEOUS) ×4 IMPLANT
SET HNDPC FAN SPRY TIP SCT (DISPOSABLE) IMPLANT
TOWEL OR 17X24 6PK STRL BLUE (TOWEL DISPOSABLE) ×2 IMPLANT
TOWEL OR 17X26 10 PK STRL BLUE (TOWEL DISPOSABLE) ×2 IMPLANT
UNDERPAD 30X30 INCONTINENT (UNDERPADS AND DIAPERS) ×1 IMPLANT
WATER STERILE IRR 1000ML POUR (IV SOLUTION) IMPLANT

## 2015-06-19 NOTE — Transfer of Care (Signed)
Immediate Anesthesia Transfer of Care Note  Patient: Courtney Terry  Procedure(s) Performed: Procedure(s): I&D  AND REMOVAL AND CLOSURE OF RIGHT SALINE IMPLANT (Right)  Patient Location: PACU  Anesthesia Type:General  Level of Consciousness: sedated  Airway & Oxygen Therapy: Patient Spontanous Breathing and Patient connected to nasal cannula oxygen  Post-op Assessment: Report given to RN and Post -op Vital signs reviewed and stable  Post vital signs: stable  Last Vitals:  Filed Vitals:   06/19/15 1832  BP:   Pulse:   Temp: 36.4 C  Resp:     Complications: No apparent anesthesia complications
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
## 2015-06-19 NOTE — Op Note (Signed)
Operative Note   DATE OF OPERATION: 9.12.2016  LOCATION: [facility]- observation  SURGICAL DIVISION: Plastic Surgery  PREOPERATIVE DIAGNOSES:  1. History right breast cancer 2. Acquired absence breast 3. Complication breast prosthesis right  POSTOPERATIVE DIAGNOSES:  same  PROCEDURE:  Removal right breast implant  SURGEON: Irene Limbo MD MBA  ASSISTANT: none  ANESTHESIA:  General.   EBL: 30 ml  COMPLICATIONS: None.   INDICATIONS FOR PROCEDURE:  The patient, Courtney Terry, is a 54 y.o. female born on 26-Oct-1960, is here for removal grossly exposed implant on right. She underwent placement permanent implant as part breast reconstruction 2 weeks ago. Patient reported loss of drain 2 days ago when doing laundry and possible opening of incision at that time. Notes onset of bleeding from incision last night and presented with gross exposure implant via IMF incision. Plan removal and no replacement at this time.    FINDINGS: Dehiscence of inframammary incision near complete with exposure intact implant.   DESCRIPTION OF PROCEDURE:  The patient's operative site was marked with the patient in the preoperative area. The patient was taken to the operating room.  IV antibiotics were given. The implant was grossly exposed through incision and was removed. The patient's operative site was prepped and draped in a sterile fashion. A time out was performed and all information was confirmed to be correct. Cavity irrigated with saline and treated with currettage. Additional saline irrigation completed. 15 Fr JP placed in cavity and secured with 2-0 nylon. Inframammary incision closed with 3-0 vicryl in superficial fascia and to reapproximate pectoralis muscle lateral border to elevated serratus muscle. 4-0 vicryl interrupted placed in dermis and skin closure completed with running 4-0 nylon. Dermabond applied followed by steris, dry dressing and breast binder.  The patient was allowed to wake from  anesthesia, extubated and taken to the recovery room in satisfactory condition.   SPECIMENS: none  DRAINS: 15 Fr JP in right breast cavity  Irene Limbo, MD [facility] [phone]

## 2015-06-19 NOTE — Anesthesia Procedure Notes (Signed)
Procedure Name: LMA Insertion Date/Time: 06/19/2015 6:00 PM Performed by: Merdis Delay Pre-anesthesia Checklist: Emergency Drugs available, Suction available, Patient being monitored, Patient identified and Timeout performed Patient Re-evaluated:Patient Re-evaluated prior to inductionOxygen Delivery Method: Circle system utilized Preoxygenation: Pre-oxygenation with 100% oxygen Intubation Type: IV induction Ventilation: Mask ventilation without difficulty LMA: LMA inserted LMA Size: 4.0 Placement Confirmation: positive ETCO2,  CO2 detector and breath sounds checked- equal and bilateral Tube secured with: Tape Dental Injury: Teeth and Oropharynx as per pre-operative assessment

## 2015-06-19 NOTE — Anesthesia Preprocedure Evaluation (Addendum)
Anesthesia Evaluation  Patient identified by MRN, date of birth, ID band Patient awake    Reviewed: Allergy & Precautions, NPO status , Patient's Chart, lab work & pertinent test results  Airway Mallampati: I  TM Distance: >3 FB Neck ROM: Full    Dental  (+) Upper Dentures   Pulmonary asthma , COPD,  oxygen dependent, Current Smoker, former smoker,    breath sounds clear to auscultation (-) decreased breath sounds      Cardiovascular negative cardio ROS   Rhythm:Regular Rate:Normal     Neuro/Psych PSYCHIATRIC DISORDERS Anxiety Depression Bipolar Disorder Schizophrenia    GI/Hepatic negative GI ROS, Neg liver ROS,   Endo/Other  Hypothyroidism   Renal/GU negative Renal ROS  negative genitourinary   Musculoskeletal  (+) Arthritis , Osteoarthritis,    Abdominal   Peds negative pediatric ROS (+)  Hematology  (+) anemia ,   Anesthesia Other Findings   Reproductive/Obstetrics negative OB ROS                            Lab Results  Component Value Date   WBC 9.7 06/07/2015   HGB 11.8* 06/07/2015   HCT 36.4 06/07/2015   MCV 85.0 06/07/2015   PLT 236 06/07/2015   Lab Results  Component Value Date   CREATININE 0.83 06/07/2015   BUN 14 06/07/2015   NA 138 06/07/2015   K 4.2 06/07/2015   CL 106 06/07/2015   CO2 26 06/07/2015   No results found for: INR, PROTIME  EKG: sinus tachycardia.  Anesthesia Physical  Anesthesia Plan  ASA: III  Anesthesia Plan: General   Post-op Pain Management:    Induction: Intravenous  Airway Management Planned: LMA  Additional Equipment:   Intra-op Plan:   Post-operative Plan: Extubation in OR  Informed Consent: I have reviewed the patients History and Physical, chart, labs and discussed the procedure including the risks, benefits and alternatives for the proposed anesthesia with the patient or authorized representative who has indicated his/her  understanding and acceptance.   Dental advisory given  Plan Discussed with: CRNA  Anesthesia Plan Comments:         Anesthesia Quick Evaluation

## 2015-06-19 NOTE — Anesthesia Postprocedure Evaluation (Signed)
  Anesthesia Post-op Note  Patient: Courtney Terry  Procedure(s) Performed: Procedure(s): I&D  AND REMOVAL AND CLOSURE OF RIGHT SALINE BREAST IMPLANT (Right)  Patient Location: PACU  Anesthesia Type:General  Level of Consciousness: awake  Airway and Oxygen Therapy: Patient Spontanous Breathing  Post-op Pain: mild  Post-op Assessment: Post-op Vital signs reviewed              Post-op Vital Signs: Reviewed  Last Vitals:  Filed Vitals:   06/19/15 1920  BP: 128/90  Pulse: 71  Temp:   Resp: 10    Complications: No apparent anesthesia complications

## 2015-06-20 ENCOUNTER — Encounter (HOSPITAL_COMMUNITY): Payer: Self-pay | Admitting: Plastic Surgery

## 2015-06-20 ENCOUNTER — Observation Stay (HOSPITAL_COMMUNITY): Payer: Medicaid Other | Admitting: Certified Registered Nurse Anesthetist

## 2015-06-20 ENCOUNTER — Encounter (HOSPITAL_COMMUNITY)
Admission: AD | Disposition: A | Discharge status: Home / Self Care | Payer: Medicaid Other | Source: Ambulatory Visit | Attending: Plastic Surgery

## 2015-06-20 DIAGNOSIS — Z853 Personal history of malignant neoplasm of breast: Secondary | ICD-10-CM | POA: Diagnosis not present

## 2015-06-20 DIAGNOSIS — Z23 Encounter for immunization: Secondary | ICD-10-CM | POA: Diagnosis not present

## 2015-06-20 DIAGNOSIS — T8131XA Disruption of external operation (surgical) wound, not elsewhere classified, initial encounter: Secondary | ICD-10-CM | POA: Diagnosis not present

## 2015-06-20 DIAGNOSIS — T8589XA Other specified complication of internal prosthetic devices, implants and grafts, not elsewhere classified, initial encounter: Secondary | ICD-10-CM | POA: Diagnosis not present

## 2015-06-20 HISTORY — PX: BREAST IMPLANT REMOVAL: SHX5361

## 2015-06-20 SURGERY — REMOVAL, IMPLANT, BREAST
Anesthesia: General | Site: Breast | Laterality: Left

## 2015-06-20 MED ORDER — OXYCODONE HCL 5 MG/5ML PO SOLN: 5.0000 mg | Freq: Once | ORAL | Status: DC | PRN

## 2015-06-20 MED ORDER — DEXAMETHASONE SODIUM PHOSPHATE 4 MG/ML IJ SOLN
INTRAMUSCULAR | Status: DC | PRN
  Administered 2015-06-20: 4 mg via INTRAVENOUS

## 2015-06-20 MED ORDER — ACETAMINOPHEN 160 MG/5ML PO SOLN
325.0000 mg | ORAL | Status: DC | PRN
  Filled 2015-06-20: qty 20.3

## 2015-06-20 MED ORDER — FENTANYL CITRATE (PF) 100 MCG/2ML IJ SOLN: 25.0000 ug | INTRAMUSCULAR | Status: DC | PRN

## 2015-06-20 MED ORDER — CHLORHEXIDINE GLUCONATE CLOTH 2 % EX PADS
6.0000 | MEDICATED_PAD | Freq: Every day | CUTANEOUS | Status: DC
  Administered 2015-06-20 – 2015-06-21 (×2): 6 via TOPICAL

## 2015-06-20 MED ORDER — FENTANYL CITRATE (PF) 100 MCG/2ML IJ SOLN
INTRAMUSCULAR | Status: DC | PRN
  Administered 2015-06-20 (×2): 50 ug via INTRAVENOUS

## 2015-06-20 MED ORDER — LACTATED RINGERS IV SOLN
INTRAVENOUS | Status: DC
  Administered 2015-06-20: 14:00:00 via INTRAVENOUS

## 2015-06-20 MED ORDER — MUPIROCIN 2 % EX OINT
1.0000 "application " | TOPICAL_OINTMENT | Freq: Two times a day (BID) | CUTANEOUS | Status: DC
  Administered 2015-06-20 – 2015-06-21 (×3): 1 via NASAL
  Filled 2015-06-20: qty 22

## 2015-06-20 MED ORDER — DEXAMETHASONE SODIUM PHOSPHATE 4 MG/ML IJ SOLN
INTRAMUSCULAR | Status: AC
  Filled 2015-06-20: qty 1

## 2015-06-20 MED ORDER — CEFAZOLIN SODIUM 1-5 GM-% IV SOLN
1.0000 g | Freq: Three times a day (TID) | INTRAVENOUS | Status: DC
  Administered 2015-06-21: 1 g via INTRAVENOUS
  Filled 2015-06-20 (×2): qty 50

## 2015-06-20 MED ORDER — ENSURE ENLIVE PO LIQD
237.0000 mL | Freq: Three times a day (TID) | ORAL | Status: DC
  Administered 2015-06-21 (×2): 237 mL via ORAL
  Filled 2015-06-20: qty 237

## 2015-06-20 MED ORDER — LIDOCAINE HCL (CARDIAC) 20 MG/ML IV SOLN
INTRAVENOUS | Status: DC | PRN
  Administered 2015-06-20: 60 mg via INTRAVENOUS

## 2015-06-20 MED ORDER — ACETAMINOPHEN 325 MG PO TABS: 325.0000 mg | ORAL_TABLET | ORAL | Status: DC | PRN

## 2015-06-20 MED ORDER — OXYCODONE HCL 5 MG PO TABS: 5.0000 mg | ORAL_TABLET | Freq: Once | ORAL | Status: DC | PRN

## 2015-06-20 MED ORDER — 0.9 % SODIUM CHLORIDE (POUR BTL) OPTIME
TOPICAL | Status: DC | PRN
  Administered 2015-06-20: 1000 mL

## 2015-06-20 MED ORDER — FENTANYL CITRATE (PF) 250 MCG/5ML IJ SOLN
INTRAMUSCULAR | Status: AC
  Filled 2015-06-20: qty 5

## 2015-06-20 MED ORDER — MIDAZOLAM HCL 5 MG/5ML IJ SOLN
INTRAMUSCULAR | Status: DC | PRN
  Administered 2015-06-20: 2 mg via INTRAVENOUS

## 2015-06-20 MED ORDER — PROPOFOL 10 MG/ML IV BOLUS
INTRAVENOUS | Status: DC | PRN
  Administered 2015-06-20: 170 mg via INTRAVENOUS

## 2015-06-20 MED ORDER — PHENYLEPHRINE HCL 10 MG/ML IJ SOLN
INTRAMUSCULAR | Status: DC | PRN
  Administered 2015-06-20: 80 ug via INTRAVENOUS
  Administered 2015-06-20: 40 ug via INTRAVENOUS
  Administered 2015-06-20: 80 ug via INTRAVENOUS

## 2015-06-20 MED ORDER — MIDAZOLAM HCL 2 MG/2ML IJ SOLN
INTRAMUSCULAR | Status: AC
  Filled 2015-06-20: qty 4

## 2015-06-20 MED ORDER — ONDANSETRON HCL 4 MG/2ML IJ SOLN
INTRAMUSCULAR | Status: AC
  Filled 2015-06-20: qty 2

## 2015-06-20 SURGICAL SUPPLY — 48 items
ADH SKN CLS APL DERMABOND .7 (GAUZE/BANDAGES/DRESSINGS) ×1
ADH SKN CLS LQ APL DERMABOND (GAUZE/BANDAGES/DRESSINGS) ×1
BAG DECANTER FOR FLEXI CONT (MISCELLANEOUS) IMPLANT
BLADE 10 SAFETY STRL DISP (BLADE) ×2 IMPLANT
BNDG GAUZE ELAST 4 BULKY (GAUZE/BANDAGES/DRESSINGS) IMPLANT
CANISTER SUCTION 2500CC (MISCELLANEOUS) ×2 IMPLANT
CHLORAPREP W/TINT 26ML (MISCELLANEOUS) IMPLANT
CLSR STERI-STRIP ANTIMIC 1/2X4 (GAUZE/BANDAGES/DRESSINGS) ×1 IMPLANT
CONT SPEC STER OR (MISCELLANEOUS) IMPLANT
COVER SURGICAL LIGHT HANDLE (MISCELLANEOUS) ×2 IMPLANT
DERMABOND ADHESIVE PROPEN (GAUZE/BANDAGES/DRESSINGS) ×1
DERMABOND ADVANCED (GAUZE/BANDAGES/DRESSINGS) ×1
DERMABOND ADVANCED .7 DNX12 (GAUZE/BANDAGES/DRESSINGS) IMPLANT
DERMABOND ADVANCED .7 DNX6 (GAUZE/BANDAGES/DRESSINGS) IMPLANT
DRAIN CHANNEL 15F RND FF W/TCR (WOUND CARE) ×1 IMPLANT
DRAPE IMP U-DRAPE 54X76 (DRAPES) ×2 IMPLANT
DRAPE INCISE IOBAN 66X45 STRL (DRAPES) IMPLANT
DRAPE LAPAROTOMY TRNSV 102X78 (DRAPE) ×1 IMPLANT
DRSG ADAPTIC 3X8 NADH LF (GAUZE/BANDAGES/DRESSINGS) IMPLANT
DRSG PAD ABDOMINAL 8X10 ST (GAUZE/BANDAGES/DRESSINGS) ×2 IMPLANT
DRSG VAC ATS LRG SENSATRAC (GAUZE/BANDAGES/DRESSINGS) IMPLANT
DRSG VAC ATS MED SENSATRAC (GAUZE/BANDAGES/DRESSINGS) IMPLANT
DRSG VAC ATS SM SENSATRAC (GAUZE/BANDAGES/DRESSINGS) IMPLANT
ELECT CAUTERY BLADE 6.4 (BLADE) ×2 IMPLANT
ELECT REM PT RETURN 9FT ADLT (ELECTROSURGICAL) ×2
ELECTRODE REM PT RTRN 9FT ADLT (ELECTROSURGICAL) ×1 IMPLANT
EVACUATOR SILICONE 100CC (DRAIN) ×1 IMPLANT
GAUZE SPONGE 4X4 12PLY STRL (GAUZE/BANDAGES/DRESSINGS) ×2 IMPLANT
GLOVE BIO SURGEON STRL SZ 6 (GLOVE) ×2 IMPLANT
GLOVE SURG SS PI 6.0 STRL IVOR (GLOVE) ×2 IMPLANT
GOWN STRL REUS W/ TWL LRG LVL3 (GOWN DISPOSABLE) ×2 IMPLANT
GOWN STRL REUS W/TWL LRG LVL3 (GOWN DISPOSABLE) ×4
KIT BASIN OR (CUSTOM PROCEDURE TRAY) ×2 IMPLANT
KIT ROOM TURNOVER OR (KITS) ×2 IMPLANT
NS IRRIG 1000ML POUR BTL (IV SOLUTION) ×2 IMPLANT
PACK GENERAL/GYN (CUSTOM PROCEDURE TRAY) ×2 IMPLANT
PAD ARMBOARD 7.5X6 YLW CONV (MISCELLANEOUS) ×4 IMPLANT
SUT ETHILON 3 0 PS 1 (SUTURE) ×1 IMPLANT
SUT ETHILON 4 0 PS 2 18 (SUTURE) ×1 IMPLANT
SUT VIC AB 3-0 FS2 27 (SUTURE) ×1 IMPLANT
SUT VIC AB 3-0 PS2 18 (SUTURE) ×2
SUT VIC AB 3-0 PS2 18XBRD (SUTURE) IMPLANT
SUT VIC AB 5-0 PS2 18 (SUTURE) IMPLANT
SWAB COLLECTION DEVICE MRSA (MISCELLANEOUS) IMPLANT
TOWEL OR 17X24 6PK STRL BLUE (TOWEL DISPOSABLE) ×2 IMPLANT
TOWEL OR 17X26 10 PK STRL BLUE (TOWEL DISPOSABLE) ×2 IMPLANT
TUBE ANAEROBIC SPECIMEN COL (MISCELLANEOUS) IMPLANT
UNDERPAD 30X30 INCONTINENT (UNDERPADS AND DIAPERS) ×2 IMPLANT

## 2015-06-20 NOTE — Transfer of Care (Signed)
Immediate Anesthesia Transfer of Care Note  Patient: Courtney Terry  Procedure(s) Performed: Procedure(s): REMOVAL  LEFT BREAST IMPLANT (Left)  Patient Location: PACU  Anesthesia Type:General  Level of Consciousness: awake, oriented and patient cooperative  Airway & Oxygen Therapy: Patient Spontanous Breathing and Patient connected to nasal cannula oxygen  Post-op Assessment: Report given to RN and Post -op Vital signs reviewed and stable  Post vital signs: Reviewed  Last Vitals:  Filed Vitals:   06/20/15 1407  BP: 115/72  Pulse: 69  Temp: 36.9 C  Resp: 16    Complications: No apparent anesthesia complications

## 2015-06-20 NOTE — Progress Notes (Signed)
2 weeks post placement bilateral implants, removal right TE. POD#1 removal right breast implant  No fevers overnight. No n/v  RN reports thick drainage left breast  Temp:  [97 F (36.1 C)-98.6 F (37 C)] 98.6 F (37 C) (09/13 0512) Pulse Rate:  [63-91] 85 (09/13 0512) Resp:  [8-21] 16 (09/13 0512) BP: (97-149)/(58-100) 97/58 mmHg (09/13 0512) SpO2:  [92 %-100 %] 94 % (09/13 0512) Weight:  [48.6 kg (107 lb 2.3 oz)] 48.6 kg (107 lb 2.3 oz) (09/12 1953)   R JP 40 Left 35  Left breast soft without erythema with milky drainage left breast  Alert, NAD states she is hungry  A/P: NPO now. Plan removal left breast implant  Irene Limbo, MD [facility] Plastic & Reconstructive Surgery [phone]

## 2015-06-20 NOTE — Interval H&P Note (Signed)
History and Physical Interval Note:  06/20/2015 10:26 AM  Courtney Terry  has presented today for surgery, with the diagnosis of infected implant left  The various methods of treatment have been discussed with the patient and family. After consideration of risks, benefits and other options for treatment, the patient has consented to  Procedure(s): REMOVAL  LEFT BREAST IMPLANTS (Left) as a surgical intervention .  The patient's history has been reviewed, patient examined, no change in status, stable for surgery.  I have reviewed the patient's chart and labs.  Questions were answered to the patient's satisfaction.     Yashar Inclan

## 2015-06-20 NOTE — Anesthesia Preprocedure Evaluation (Addendum)
Anesthesia Evaluation  Patient identified by MRN, date of birth, ID band Patient awake    Reviewed: Allergy & Precautions, NPO status , Patient's Chart, lab work & pertinent test results  Airway Mallampati: I  TM Distance: >3 FB Neck ROM: Full    Dental  (+) Edentulous Upper   Pulmonary asthma , COPD,  COPD inhaler and oxygen dependent, Current Smoker, former smoker,    breath sounds clear to auscultation (-) decreased breath sounds      Cardiovascular negative cardio ROS   Rhythm:Regular Rate:Normal     Neuro/Psych Seizures -, Well Controlled,  PSYCHIATRIC DISORDERS Anxiety Depression Bipolar Disorder Schizophrenia    GI/Hepatic negative GI ROS, Neg liver ROS,   Endo/Other  Hypothyroidism   Renal/GU negative Renal ROS  negative genitourinary   Musculoskeletal  (+) Arthritis , Osteoarthritis,    Abdominal   Peds negative pediatric ROS (+)  Hematology  (+) anemia ,   Anesthesia Other Findings   Reproductive/Obstetrics negative OB ROS                            Anesthesia Physical Anesthesia Plan  ASA: III  Anesthesia Plan: General   Post-op Pain Management:    Induction: Intravenous  Airway Management Planned: LMA  Additional Equipment: None  Intra-op Plan:   Post-operative Plan: Extubation in OR  Informed Consent: I have reviewed the patients History and Physical, chart, labs and discussed the procedure including the risks, benefits and alternatives for the proposed anesthesia with the patient or authorized representative who has indicated his/her understanding and acceptance.     Plan Discussed with: CRNA and Surgeon  Anesthesia Plan Comments:         Anesthesia Quick Evaluation

## 2015-06-20 NOTE — Anesthesia Procedure Notes (Signed)
Procedure Name: LMA Insertion Date/Time: 06/20/2015 4:12 PM Performed by: Luciana Axe K Pre-anesthesia Checklist: Patient identified, Emergency Drugs available, Suction available, Patient being monitored and Timeout performed Patient Re-evaluated:Patient Re-evaluated prior to inductionOxygen Delivery Method: Circle system utilized Preoxygenation: Pre-oxygenation with 100% oxygen Intubation Type: IV induction Ventilation: Mask ventilation without difficulty LMA: LMA inserted LMA Size: 4.0 Number of attempts: 1 Placement Confirmation: positive ETCO2,  CO2 detector and breath sounds checked- equal and bilateral Tube secured with: Tape Dental Injury: Teeth and Oropharynx as per pre-operative assessment

## 2015-06-20 NOTE — Progress Notes (Signed)
Initial Nutrition Assessment  DOCUMENTATION CODES:   Severe malnutrition in context of chronic illness  INTERVENTION:   Ensure Enlive po TID, each supplement provides 350 kcal and 20 grams of protein  NUTRITION DIAGNOSIS:   Malnutrition related to chronic illness (breast cancer) as evidenced by severe depletion of body fat, severe depletion of muscle mass.  GOAL:   Patient will meet greater than or equal to 90% of their needs  MONITOR:   PO intake, Supplement acceptance, Diet advancement, Labs, Skin, I & O's  REASON FOR ASSESSMENT:   Malnutrition Screening Tool   ASSESSMENT:   Pt with hx of breast cancer and bilateral mastectomies. Pt admitted with infected right exposed saline implant with closure without replacement.   Pt reports having lost weight with cancer dx and has been unable to regain weight. She feels she is still losing.  24 hr recall:  Breakfast: grits, eggs, toast Lunch: none Dinner: meat, starch, veggie (pt cooks at home) No oral nutrition supplements  Pt agreeable to ensure Nutrition-Focused physical exam completed. Findings are severe fat depletion, severe muscle depletion, and no edema.    Diet Order:  Diet NPO time specified  Skin:  Wound (see comment) (Bilateral breast incisions)  Last BM:  9/11  Height:   Ht Readings from Last 1 Encounters:  06/06/15 5\' 10"  (1.778 m)    Weight:   Wt Readings from Last 1 Encounters:  06/19/15 107 lb 2.3 oz (48.6 kg)    Ideal Body Weight:  68 kg  BMI:  Body mass index is 15.37 kg/(m^2).  Estimated Nutritional Needs:   Kcal:  1800-2000  Protein:  85-100 grams  Fluid:  > 1.8 L/day  EDUCATION NEEDS:   No education needs identified at this time  [place], [place], [place] Pager [phone] After Hours Pager

## 2015-06-20 NOTE — Op Note (Signed)
Operative Note   DATE OF OPERATION: 9.13.2016  LOCATION: [facility]- inpatient  SURGICAL DIVISION: Plastic Surgery  PREOPERATIVE DIAGNOSES:  1. History right breast cancer 2. Acquired absence right breast 3. History left breast augmentation for symmetry  POSTOPERATIVE DIAGNOSES:  same  PROCEDURE:  Removal left breast implant  SURGEON: Irene Limbo MD MBA  ASSISTANT: none  ANESTHESIA:  General.   EBL: 30 ml  COMPLICATIONS: None.   INDICATIONS FOR PROCEDURE:  The patient, Courtney Terry, is a 54 y.o. female born on Sep 10, 1961, is here for removal left breast implant. She presented with gross exposure of right breast implant and underwent explantation of this side yesterday. She demonstrated milky drainage throughout last 24 hours in hospital on left side and plan for removal.    FINDINGS: No cellulitis left breast, however drain consistent output with thick quality.  DESCRIPTION OF PROCEDURE:  The patient's operative site was marked with the patient in the preoperative area. The patient was taken to the operating room. Patient currently on IV antibiotics and additional prophylaxis not given. Prior left breast drain removed. The patient's operative site was prepped and draped in a sterile fashion. A time out was performed and all information was confirmed to be correct. Incision made through prior inframammary scar. This was carried through superficial fascia to implant pocket. Implant removed intact. Examination of cavity with developing capsule consistent with time post operative. Cultures obtained. Cavity treated by curretage and irrigated with saline. New 15 Fr JP placed in cavity and secured to skin with 2-0 nylon. Closure completed with 3-0 vicryl in superficial fascia and 4-0 vicryl in dermis. Skin closed with 4-0 nylon running followed by tissue adhesive and steri strips. Dry dressing and breast binder applied.   The patient was allowed to wake from anesthesia, extubated and  taken to the recovery room in satisfactory condition.   SPECIMENS: cultures  DRAINS: 15 Fr JP in left breast  Irene Limbo, MD [facility] [place] [facility]astic & Reconstructive Surgery [phone]

## 2015-06-20 NOTE — Progress Notes (Signed)
Patient returned from PACU and stable. Family at bedside.

## 2015-06-20 NOTE — H&P (View-Only) (Signed)
2 weeks post placement bilateral implants, removal right TE. POD#1 removal right breast implant  No fevers overnight. No n/v  RN reports thick drainage left breast  Temp:  [97 F (36.1 C)-98.6 F (37 C)] 98.6 F (37 C) (09/13 0512) Pulse Rate:  [63-91] 85 (09/13 0512) Resp:  [8-21] 16 (09/13 0512) BP: (97-149)/(58-100) 97/58 mmHg (09/13 0512) SpO2:  [92 %-100 %] 94 % (09/13 0512) Weight:  [48.6 kg (107 lb 2.3 oz)] 48.6 kg (107 lb 2.3 oz) (09/12 1953)   R JP 40 Left 35  Left breast soft without erythema with milky drainage left breast  Alert, NAD states she is hungry  A/P: NPO now. Plan removal left breast implant  Irene Limbo, MD [facility] Plastic & Reconstructive Surgery [phone]

## 2015-06-21 ENCOUNTER — Encounter (HOSPITAL_COMMUNITY): Payer: Self-pay | Admitting: Plastic Surgery

## 2015-06-21 DIAGNOSIS — T8589XA Other specified complication of internal prosthetic devices, implants and grafts, not elsewhere classified, initial encounter: Secondary | ICD-10-CM | POA: Diagnosis not present

## 2015-06-21 MED ORDER — OXYCODONE-ACETAMINOPHEN 10-325 MG PO TABS: 1.0000 | ORAL_TABLET | ORAL | Status: DC | PRN

## 2015-06-21 MED ORDER — AMOXICILLIN-POT CLAVULANATE 500-125 MG PO TABS: 1.0000 | ORAL_TABLET | Freq: Three times a day (TID) | ORAL | Status: DC

## 2015-06-21 MED ORDER — AMOXICILLIN-POT CLAVULANATE 500-125 MG PO TABS
1.0000 | ORAL_TABLET | Freq: Three times a day (TID) | ORAL | Status: DC
  Administered 2015-06-21: 500 mg via ORAL
  Filled 2015-06-21 (×3): qty 1
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
## 2015-06-21 NOTE — Progress Notes (Signed)
Discussed discharge summary with patient. Reviewed all medications with patient. Patient does not want to stay and speak with social worker. States she is ready to go home. Patient received Rx. Patient educated on JP drain. Patient ready for discharge.

## 2015-06-21 NOTE — Progress Notes (Signed)
Patient requests discharge, does not want to wait to speak to SW.
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
## 2015-06-23 LAB — WOUND CULTURE

## 2015-06-23 NOTE — Anesthesia Postprocedure Evaluation (Signed)
  Anesthesia Post-op Note  Patient: Courtney Terry  Procedure(s) Performed: Procedure(s): REMOVAL  LEFT BREAST IMPLANT (Left)  Patient Location: PACU  Anesthesia Type:General  Level of Consciousness: awake  Airway and Oxygen Therapy: Patient Spontanous Breathing  Post-op Pain: moderate  Post-op Assessment: Post-op Vital signs reviewed, Patient's Cardiovascular Status Stable, Respiratory Function Stable, Patent Airway, No signs of Nausea or vomiting and Pain level controlled              Post-op Vital Signs: Reviewed and stable  Last Vitals:  Filed Vitals:   06/21/15 1500  BP: 123/76  Pulse: 86  Temp: 36.5 C  Resp: 16    Complications: No apparent anesthesia complications

## 2015-06-25 LAB — ANAEROBIC CULTURE

## 2015-07-05 ENCOUNTER — Other Ambulatory Visit: Payer: Self-pay | Admitting: Internal Medicine

## 2015-07-07 ENCOUNTER — Telehealth: Payer: Self-pay | Admitting: Hematology

## 2015-07-07 NOTE — Telephone Encounter (Signed)
pt cld to r/s missed appt-cld pt and left message to call back to r/s

## 2015-07-21 ENCOUNTER — Telehealth: Payer: Self-pay | Admitting: Hematology

## 2015-07-21 NOTE — Telephone Encounter (Signed)
pt cld to make appt-gave pt appt time & date

## 2015-07-28 ENCOUNTER — Encounter: Payer: Self-pay | Admitting: Hematology

## 2015-07-28 ENCOUNTER — Ambulatory Visit (HOSPITAL_BASED_OUTPATIENT_CLINIC_OR_DEPARTMENT_OTHER): Payer: Medicaid Other | Admitting: Hematology

## 2015-07-28 ENCOUNTER — Other Ambulatory Visit (HOSPITAL_BASED_OUTPATIENT_CLINIC_OR_DEPARTMENT_OTHER): Payer: Medicaid Other

## 2015-07-28 ENCOUNTER — Telehealth: Payer: Self-pay | Admitting: Hematology

## 2015-07-28 VITALS — BP 107/90 | HR 79 | Temp 98.3°F | Resp 18 | Ht 70.0 in | Wt 100.5 lb

## 2015-07-28 DIAGNOSIS — E2839 Other primary ovarian failure: Secondary | ICD-10-CM

## 2015-07-28 DIAGNOSIS — Z79811 Long term (current) use of aromatase inhibitors: Secondary | ICD-10-CM | POA: Diagnosis not present

## 2015-07-28 DIAGNOSIS — R630 Anorexia: Secondary | ICD-10-CM

## 2015-07-28 DIAGNOSIS — R634 Abnormal weight loss: Secondary | ICD-10-CM | POA: Diagnosis not present

## 2015-07-28 DIAGNOSIS — C50411 Malignant neoplasm of upper-outer quadrant of right female breast: Secondary | ICD-10-CM

## 2015-07-28 DIAGNOSIS — C50419 Malignant neoplasm of upper-outer quadrant of unspecified female breast: Secondary | ICD-10-CM

## 2015-07-28 DIAGNOSIS — Z853 Personal history of malignant neoplasm of breast: Secondary | ICD-10-CM

## 2015-07-28 DIAGNOSIS — E46 Unspecified protein-calorie malnutrition: Secondary | ICD-10-CM

## 2015-07-28 DIAGNOSIS — Z170 Estrogen receptor positive status [ER+]: Secondary | ICD-10-CM | POA: Diagnosis not present

## 2015-07-28 LAB — CBC WITH DIFFERENTIAL/PLATELET
BASO%: 0.9 % (ref 0.0–2.0)
Basophils Absolute: 0.1 10*3/uL (ref 0.0–0.1)
EOS%: 2 % (ref 0.0–7.0)
Eosinophils Absolute: 0.1 10*3/uL (ref 0.0–0.5)
HEMATOCRIT: 40.8 % (ref 34.8–46.6)
HGB: 12.9 g/dL (ref 11.6–15.9)
LYMPH#: 1.4 10*3/uL (ref 0.9–3.3)
LYMPH%: 23.6 % (ref 14.0–49.7)
MCH: 27.1 pg (ref 25.1–34.0)
MCHC: 31.8 g/dL (ref 31.5–36.0)
MCV: 85.3 fL (ref 79.5–101.0)
MONO#: 0.4 10*3/uL (ref 0.1–0.9)
MONO%: 6 % (ref 0.0–14.0)
NEUT#: 4 10*3/uL (ref 1.5–6.5)
NEUT%: 67.5 % (ref 38.4–76.8)
Platelets: 248 10*3/uL (ref 145–400)
RBC: 4.78 10*6/uL (ref 3.70–5.45)
RDW: 15.5 % — ABNORMAL HIGH (ref 11.2–14.5)
WBC: 5.9 10*3/uL (ref 3.9–10.3)

## 2015-07-28 LAB — COMPREHENSIVE METABOLIC PANEL (CC13)
ALT: 13 U/L (ref 0–55)
ANION GAP: 8 meq/L (ref 3–11)
AST: 16 U/L (ref 5–34)
Albumin: 3.4 g/dL — ABNORMAL LOW (ref 3.5–5.0)
Alkaline Phosphatase: 105 U/L (ref 40–150)
BUN: 15.5 mg/dL (ref 7.0–26.0)
CHLORIDE: 108 meq/L (ref 98–109)
CO2: 26 meq/L (ref 22–29)
Calcium: 9.1 mg/dL (ref 8.4–10.4)
Creatinine: 0.7 mg/dL (ref 0.6–1.1)
Glucose: 84 mg/dl (ref 70–140)
Potassium: 3.7 mEq/L (ref 3.5–5.1)
Sodium: 142 mEq/L (ref 136–145)
Total Bilirubin: <0.3 mg/dL (ref 0.20–1.20)
Total Protein: 6.8 g/dL (ref 6.4–8.3)
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
## 2015-07-28 NOTE — Telephone Encounter (Signed)
per pof to sch pt appt-gave pt avs-sch pt DEXA

## 2015-09-13 ENCOUNTER — Other Ambulatory Visit: Payer: Self-pay

## 2015-09-27 ENCOUNTER — Encounter: Payer: Self-pay | Admitting: Hematology

## 2015-09-27 ENCOUNTER — Telehealth: Payer: Self-pay | Admitting: Hematology

## 2015-09-27 ENCOUNTER — Other Ambulatory Visit: Payer: Self-pay | Admitting: *Deleted

## 2015-09-27 ENCOUNTER — Other Ambulatory Visit: Payer: Self-pay

## 2015-09-27 NOTE — Progress Notes (Signed)
No show  This encounter was created in error - please disregard.

## 2015-09-27 NOTE — Telephone Encounter (Signed)
per pof to r/s pt appt-cld pt & left a message and adv ided to call to r/s missed appt for 12/21
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
## 2015-10-17 ENCOUNTER — Encounter (HOSPITAL_COMMUNITY): Payer: Self-pay | Admitting: *Deleted

## 2015-10-17 NOTE — Progress Notes (Signed)
Pt denies any cardiac history, chest pain. States she has sob with exertion due to COPD.

## 2015-10-18 ENCOUNTER — Other Ambulatory Visit: Payer: Self-pay

## 2015-10-19 MED ORDER — CHLORHEXIDINE GLUCONATE 4 % EX LIQD: 1.0000 "application " | Freq: Once | CUTANEOUS | Status: DC

## 2015-10-19 MED ORDER — VANCOMYCIN HCL IN DEXTROSE 1-5 GM/200ML-% IV SOLN
1000.0000 mg | INTRAVENOUS | Status: AC
  Administered 2015-10-20: 1000 mg via INTRAVENOUS
  Filled 2015-10-19: qty 200

## 2015-10-20 ENCOUNTER — Inpatient Hospital Stay (HOSPITAL_COMMUNITY): Payer: Medicaid Other | Admitting: Anesthesiology

## 2015-10-20 ENCOUNTER — Inpatient Hospital Stay (HOSPITAL_COMMUNITY)
Admission: RE | Admit: 2015-10-20 | Discharge: 2015-10-22 | DRG: 941 | Disposition: A | Discharge status: Home / Self Care | Payer: Medicaid Other | Source: Ambulatory Visit | Attending: Plastic Surgery | Admitting: Plastic Surgery

## 2015-10-20 ENCOUNTER — Encounter (HOSPITAL_COMMUNITY): Payer: Self-pay | Admitting: *Deleted

## 2015-10-20 ENCOUNTER — Encounter (HOSPITAL_COMMUNITY)
Admission: RE | Disposition: A | Discharge status: Home / Self Care | Payer: Self-pay | Source: Ambulatory Visit | Attending: Plastic Surgery

## 2015-10-20 DIAGNOSIS — N651 Disproportion of reconstructed breast: Secondary | ICD-10-CM | POA: Diagnosis present

## 2015-10-20 DIAGNOSIS — J449 Chronic obstructive pulmonary disease, unspecified: Secondary | ICD-10-CM | POA: Diagnosis present

## 2015-10-20 DIAGNOSIS — Z8614 Personal history of Methicillin resistant Staphylococcus aureus infection: Secondary | ICD-10-CM | POA: Diagnosis not present

## 2015-10-20 DIAGNOSIS — Z853 Personal history of malignant neoplasm of breast: Secondary | ICD-10-CM | POA: Diagnosis present

## 2015-10-20 DIAGNOSIS — Z9011 Acquired absence of right breast and nipple: Secondary | ICD-10-CM | POA: Diagnosis not present

## 2015-10-20 DIAGNOSIS — Z9981 Dependence on supplemental oxygen: Secondary | ICD-10-CM | POA: Diagnosis not present

## 2015-10-20 DIAGNOSIS — F319 Bipolar disorder, unspecified: Secondary | ICD-10-CM | POA: Diagnosis present

## 2015-10-20 HISTORY — PX: PLACEMENT OF BREAST IMPLANTS: SHX6334

## 2015-10-20 LAB — HEMOGLOBIN AND HEMATOCRIT, BLOOD
HCT: 38.4 % (ref 36.0–46.0)
HEMOGLOBIN: 12.4 g/dL (ref 12.0–15.0)

## 2015-10-20 LAB — BASIC METABOLIC PANEL
ANION GAP: 6 (ref 5–15)
BUN: 14 mg/dL (ref 6–20)
CALCIUM: 9.1 mg/dL (ref 8.9–10.3)
CO2: 28 mmol/L (ref 22–32)
Chloride: 108 mmol/L (ref 101–111)
Creatinine, Ser: 0.67 mg/dL (ref 0.44–1.00)
GFR calc non Af Amer: >60 mL/min (ref >60)
Glucose, Bld: 102 mg/dL — ABNORMAL HIGH (ref 65–99)
POTASSIUM: 3.7 mmol/L (ref 3.5–5.1)
Sodium: 142 mmol/L (ref 135–145)

## 2015-10-20 SURGERY — INSERTION, IMPLANT, BREAST
Anesthesia: General | Site: Breast | Laterality: Bilateral

## 2015-10-20 MED ORDER — ONDANSETRON HCL 4 MG/2ML IJ SOLN
INTRAMUSCULAR | Status: DC | PRN
  Administered 2015-10-20: 4 mg via INTRAVENOUS

## 2015-10-20 MED ORDER — SODIUM CHLORIDE 0.9 % IJ SOLN
INTRAMUSCULAR | Status: DC | PRN
  Administered 2015-10-20: 1000 mL via INTRAVENOUS

## 2015-10-20 MED ORDER — LEVOTHYROXINE SODIUM 75 MCG PO TABS
75.0000 ug | ORAL_TABLET | Freq: Every day | ORAL | Status: DC
Start: 2015-10-21 — End: 2015-10-22
  Administered 2015-10-21 – 2015-10-22 (×2): 75 ug via ORAL
  Filled 2015-10-20 (×2): qty 1

## 2015-10-20 MED ORDER — KCL IN DEXTROSE-NACL 20-5-0.45 MEQ/L-%-% IV SOLN
INTRAVENOUS | Status: DC
  Administered 2015-10-21 (×2): via INTRAVENOUS
  Filled 2015-10-20 (×3): qty 1000

## 2015-10-20 MED ORDER — 0.9 % SODIUM CHLORIDE (POUR BTL) OPTIME
TOPICAL | Status: DC | PRN
  Administered 2015-10-20: 1000 mL

## 2015-10-20 MED ORDER — PROMETHAZINE HCL 25 MG/ML IJ SOLN
INTRAMUSCULAR | Status: AC
  Administered 2015-10-20: 6.25 mg via INTRAVENOUS
  Filled 2015-10-20: qty 1

## 2015-10-20 MED ORDER — LIDOCAINE HCL (CARDIAC) 20 MG/ML IV SOLN
INTRAVENOUS | Status: DC | PRN
  Administered 2015-10-20: 50 mg via INTRAVENOUS

## 2015-10-20 MED ORDER — MOMETASONE FURO-FORMOTEROL FUM 200-5 MCG/ACT IN AERO
2.0000 | INHALATION_SPRAY | Freq: Two times a day (BID) | RESPIRATORY_TRACT | Status: DC
  Administered 2015-10-20 – 2015-10-21 (×3): 2 via RESPIRATORY_TRACT
  Filled 2015-10-20: qty 8.8

## 2015-10-20 MED ORDER — SUGAMMADEX SODIUM 200 MG/2ML IV SOLN
INTRAVENOUS | Status: AC
  Filled 2015-10-20: qty 2

## 2015-10-20 MED ORDER — ANASTROZOLE 1 MG PO TABS
1.0000 mg | ORAL_TABLET | Freq: Every day | ORAL | Status: DC
  Administered 2015-10-21 – 2015-10-22 (×2): 1 mg via ORAL
  Filled 2015-10-20 (×2): qty 1

## 2015-10-20 MED ORDER — ONDANSETRON 4 MG PO TBDP: 4.0000 mg | ORAL_TABLET | Freq: Four times a day (QID) | ORAL | Status: DC | PRN

## 2015-10-20 MED ORDER — ALBUTEROL SULFATE HFA 108 (90 BASE) MCG/ACT IN AERS: 1.0000 | INHALATION_SPRAY | RESPIRATORY_TRACT | Status: DC | PRN

## 2015-10-20 MED ORDER — SUGAMMADEX SODIUM 200 MG/2ML IV SOLN
INTRAVENOUS | Status: DC | PRN
  Administered 2015-10-20: 99.8 mg via INTRAVENOUS

## 2015-10-20 MED ORDER — LACTATED RINGERS IV SOLN
INTRAVENOUS | Status: DC
  Administered 2015-10-20: 10:00:00 via INTRAVENOUS

## 2015-10-20 MED ORDER — POVIDONE-IODINE 10 % EX SOLN
CUTANEOUS | Status: DC | PRN
  Administered 2015-10-20: 1 via TOPICAL

## 2015-10-20 MED ORDER — FENTANYL CITRATE (PF) 250 MCG/5ML IJ SOLN
INTRAMUSCULAR | Status: AC
  Filled 2015-10-20: qty 5

## 2015-10-20 MED ORDER — ROCURONIUM BROMIDE 100 MG/10ML IV SOLN
INTRAVENOUS | Status: DC | PRN
  Administered 2015-10-20: 50 mg via INTRAVENOUS
  Administered 2015-10-20: 20 mg via INTRAVENOUS

## 2015-10-20 MED ORDER — PHENYLEPHRINE 40 MCG/ML (10ML) SYRINGE FOR IV PUSH (FOR BLOOD PRESSURE SUPPORT)
PREFILLED_SYRINGE | INTRAVENOUS | Status: AC
  Filled 2015-10-20: qty 10

## 2015-10-20 MED ORDER — PHENYLEPHRINE HCL 10 MG/ML IJ SOLN
INTRAMUSCULAR | Status: DC | PRN
  Administered 2015-10-20 (×4): 80 ug via INTRAVENOUS

## 2015-10-20 MED ORDER — MIDAZOLAM HCL 5 MG/5ML IJ SOLN
INTRAMUSCULAR | Status: DC | PRN
  Administered 2015-10-20: 2 mg via INTRAVENOUS

## 2015-10-20 MED ORDER — FENTANYL CITRATE (PF) 100 MCG/2ML IJ SOLN
INTRAMUSCULAR | Status: DC | PRN
  Administered 2015-10-20 (×2): 50 ug via INTRAVENOUS
  Administered 2015-10-20: 150 ug via INTRAVENOUS

## 2015-10-20 MED ORDER — KETOROLAC TROMETHAMINE 30 MG/ML IJ SOLN
30.0000 mg | Freq: Three times a day (TID) | INTRAMUSCULAR | Status: AC
  Administered 2015-10-20 – 2015-10-21 (×3): 30 mg via INTRAVENOUS
  Filled 2015-10-20 (×3): qty 1

## 2015-10-20 MED ORDER — OXYCODONE HCL 5 MG PO TABS
5.0000 mg | ORAL_TABLET | ORAL | Status: DC | PRN
  Administered 2015-10-21 (×2): 10 mg via ORAL
  Filled 2015-10-20 (×2): qty 2

## 2015-10-20 MED ORDER — VANCOMYCIN HCL 500 MG IV SOLR
500.0000 mg | Freq: Two times a day (BID) | INTRAVENOUS | Status: DC
  Administered 2015-10-21 (×3): 500 mg via INTRAVENOUS
  Filled 2015-10-20 (×4): qty 500

## 2015-10-20 MED ORDER — DIVALPROEX SODIUM 500 MG PO DR TAB: 500.0000 mg | DELAYED_RELEASE_TABLET | Freq: Every day | ORAL | Status: DC

## 2015-10-20 MED ORDER — MIDAZOLAM HCL 2 MG/2ML IJ SOLN
INTRAMUSCULAR | Status: AC
  Filled 2015-10-20: qty 2

## 2015-10-20 MED ORDER — HYDROMORPHONE HCL 1 MG/ML IJ SOLN
INTRAMUSCULAR | Status: AC
  Administered 2015-10-20: 0.5 mg via INTRAVENOUS
  Filled 2015-10-20: qty 1

## 2015-10-20 MED ORDER — HYDROMORPHONE HCL 1 MG/ML IJ SOLN
0.2500 mg | INTRAMUSCULAR | Status: DC | PRN
  Administered 2015-10-20 (×4): 0.5 mg via INTRAVENOUS

## 2015-10-20 MED ORDER — HYDROMORPHONE HCL 1 MG/ML IJ SOLN
0.5000 mg | INTRAMUSCULAR | Status: DC | PRN
  Administered 2015-10-20 – 2015-10-21 (×3): 1 mg via INTRAVENOUS
  Filled 2015-10-20 (×3): qty 1

## 2015-10-20 MED ORDER — LACTATED RINGERS IV SOLN
INTRAVENOUS | Status: DC | PRN
  Administered 2015-10-20 (×2): via INTRAVENOUS

## 2015-10-20 MED ORDER — ALBUTEROL SULFATE (2.5 MG/3ML) 0.083% IN NEBU: 2.5000 mg | INHALATION_SOLUTION | RESPIRATORY_TRACT | Status: DC | PRN

## 2015-10-20 MED ORDER — SODIUM CHLORIDE 0.9 % IV SOLN
INTRAVENOUS | Status: AC
  Administered 2015-10-20: 1000 mL
  Filled 2015-10-20: qty 1

## 2015-10-20 MED ORDER — ONDANSETRON HCL 4 MG/2ML IJ SOLN
4.0000 mg | Freq: Four times a day (QID) | INTRAMUSCULAR | Status: DC | PRN
  Administered 2015-10-20: 4 mg via INTRAVENOUS
  Filled 2015-10-20: qty 2

## 2015-10-20 MED ORDER — PROPOFOL 10 MG/ML IV BOLUS
INTRAVENOUS | Status: DC | PRN
  Administered 2015-10-20: 130 mg via INTRAVENOUS

## 2015-10-20 MED ORDER — PROMETHAZINE HCL 25 MG/ML IJ SOLN
6.2500 mg | Freq: Once | INTRAMUSCULAR | Status: AC
  Administered 2015-10-20: 6.25 mg via INTRAVENOUS

## 2015-10-20 SURGICAL SUPPLY — 59 items
BAG DECANTER FOR FLEXI CONT (MISCELLANEOUS) ×2 IMPLANT
BINDER BREAST LRG (GAUZE/BANDAGES/DRESSINGS) ×1 IMPLANT
BINDER BREAST XLRG (GAUZE/BANDAGES/DRESSINGS) IMPLANT
CANISTER SUCTION 2500CC (MISCELLANEOUS) ×2 IMPLANT
CHLORAPREP W/TINT 26ML (MISCELLANEOUS) ×2 IMPLANT
CLSR STERI-STRIP ANTIMIC 1/2X4 (GAUZE/BANDAGES/DRESSINGS) ×1 IMPLANT
COVER SURGICAL LIGHT HANDLE (MISCELLANEOUS) ×2 IMPLANT
DRAIN CHANNEL 15F RND FF W/TCR (WOUND CARE) ×2 IMPLANT
DRAPE INCISE IOBAN 66X45 STRL (DRAPES) IMPLANT
DRAPE ORTHO SPLIT 77X108 STRL (DRAPES) ×4
DRAPE PROXIMA HALF (DRAPES) IMPLANT
DRAPE SURG ORHT 6 SPLT 77X108 (DRAPES) ×2 IMPLANT
DRAPE WARM FLUID 44X44 (DRAPE) ×2 IMPLANT
DRSG PAD ABDOMINAL 8X10 ST (GAUZE/BANDAGES/DRESSINGS) ×4 IMPLANT
DRSG TEGADERM 2-3/8X2-3/4 SM (GAUZE/BANDAGES/DRESSINGS) ×3 IMPLANT
DRSG TEGADERM 4X4.75 (GAUZE/BANDAGES/DRESSINGS) ×8 IMPLANT
ELECT BLADE 4.0 EZ CLEAN MEGAD (MISCELLANEOUS) ×2
ELECT CAUTERY BLADE 6.4 (BLADE) ×2 IMPLANT
ELECT COATED BLADE 2.86 ST (ELECTRODE) ×2 IMPLANT
ELECT REM PT RETURN 9FT ADLT (ELECTROSURGICAL) ×2
ELECTRODE BLDE 4.0 EZ CLN MEGD (MISCELLANEOUS) ×1 IMPLANT
ELECTRODE REM PT RTRN 9FT ADLT (ELECTROSURGICAL) ×1 IMPLANT
EVACUATOR SILICONE 100CC (DRAIN) ×2 IMPLANT
GAUZE SPONGE 4X4 12PLY STRL (GAUZE/BANDAGES/DRESSINGS) ×2 IMPLANT
GLOVE BIO SURGEON STRL SZ 6 (GLOVE) ×6 IMPLANT
GOWN STRL REUS W/ TWL LRG LVL3 (GOWN DISPOSABLE) ×2 IMPLANT
GOWN STRL REUS W/TWL LRG LVL3 (GOWN DISPOSABLE) ×8
IMPLANT BREAST SALINE 175CC (Breast) ×1 IMPLANT
IMPLANT SALINE TEXTURED 200CC (Breast) ×1 IMPLANT
KIT BASIN OR (CUSTOM PROCEDURE TRAY) ×2 IMPLANT
KIT ROOM TURNOVER OR (KITS) ×2 IMPLANT
LIQUID BAND (GAUZE/BANDAGES/DRESSINGS) ×4 IMPLANT
MARKER SKIN DUAL TIP RULER LAB (MISCELLANEOUS) ×2 IMPLANT
NS IRRIG 1000ML POUR BTL (IV SOLUTION) ×4 IMPLANT
PACK GENERAL/GYN (CUSTOM PROCEDURE TRAY) ×2 IMPLANT
PAD ABD 8X10 STRL (GAUZE/BANDAGES/DRESSINGS) ×2 IMPLANT
PAD ARMBOARD 7.5X6 YLW CONV (MISCELLANEOUS) ×2 IMPLANT
PIN SAFETY STERILE (MISCELLANEOUS) ×2 IMPLANT
SET ASEPTIC TRANSFER (MISCELLANEOUS) ×2 IMPLANT
SIZER BREAST 175CC (SIZER) ×4
SIZER BRST 175CC (SIZER) ×2
SIZER BRST 175CC SAL 1 USE (SIZER) IMPLANT
SOLUTION BETADINE 4OZ (MISCELLANEOUS) ×2 IMPLANT
STAPLER VISISTAT 35W (STAPLE) ×2 IMPLANT
SUT ETHILON 2 0 FS 18 (SUTURE) ×2 IMPLANT
SUT MNCRL 3 0 RB1 (SUTURE) IMPLANT
SUT MNCRL AB 4-0 PS2 18 (SUTURE) ×2 IMPLANT
SUT MON AB 5-0 PS2 18 (SUTURE) IMPLANT
SUT MONOCRYL 3 0 RB1 (SUTURE)
SUT VIC AB 3-0 SH 27 (SUTURE) ×4
SUT VIC AB 3-0 SH 27X BRD (SUTURE) IMPLANT
SUT VIC AB 4-0 PS2 27 (SUTURE) IMPLANT
SUT VICRYL 3 0 (SUTURE) IMPLANT
SUT VICRYL 4-0 PS2 18IN ABS (SUTURE) ×2 IMPLANT
SYR BULB IRRIGATION 50ML (SYRINGE) ×2 IMPLANT
TOWEL OR 17X24 6PK STRL BLUE (TOWEL DISPOSABLE) ×2 IMPLANT
TOWEL OR 17X26 10 PK STRL BLUE (TOWEL DISPOSABLE) ×2 IMPLANT
TRAY FOLEY CATH 16FRSI W/METER (SET/KITS/TRAYS/PACK) IMPLANT
TUBE CONNECTING 12X1/4 (SUCTIONS) ×2 IMPLANT

## 2015-10-20 NOTE — Anesthesia Preprocedure Evaluation (Addendum)
Anesthesia Evaluation  Patient identified by MRN, date of birth, ID band Patient awake    Reviewed: Allergy & Precautions, H&P , NPO status , Patient's Chart, lab work & pertinent test results  Airway Mallampati: I  TM Distance: >3 FB Neck ROM: Full    Dental no notable dental hx. (+) Edentulous Upper, Dental Advisory Given   Pulmonary shortness of breath, asthma , COPD,  COPD inhaler, former smoker,    Pulmonary exam normal breath sounds clear to auscultation       Cardiovascular negative cardio ROS   Rhythm:Regular Rate:Normal     Neuro/Psych Seizures -, Well Controlled,  PSYCHIATRIC DISORDERS Anxiety Depression Bipolar Disorder Schizophrenia PTSD   GI/Hepatic negative GI ROS, Neg liver ROS,   Endo/Other  Hypothyroidism   Renal/GU negative Renal ROS  negative genitourinary   Musculoskeletal  (+) Arthritis , Osteoarthritis,    Abdominal   Peds  Hematology negative hematology ROS (+) anemia ,   Anesthesia Other Findings   Reproductive/Obstetrics negative OB ROS                            Anesthesia Physical Anesthesia Plan  ASA: II  Anesthesia Plan: General   Post-op Pain Management:    Induction: Intravenous  Airway Management Planned: Oral ETT  Additional Equipment:   Intra-op Plan:   Post-operative Plan: Extubation in OR  Informed Consent: I have reviewed the patients History and Physical, chart, labs and discussed the procedure including the risks, benefits and alternatives for the proposed anesthesia with the patient or authorized representative who has indicated his/her understanding and acceptance.   Dental advisory given  Plan Discussed with: CRNA  Anesthesia Plan Comments:         Anesthesia Quick Evaluation

## 2015-10-20 NOTE — Interval H&P Note (Signed)
History and Physical Interval Note:  10/20/2015 8:26 AM  Courtney Terry  has presented today for surgery, with the diagnosis of HISTORY OF RIGHT BREAST CANCER, ACQUIRED ABSENCE BREAST, RIGHT ASYMMETRY NATIVE AND RECONSTRUCTION BREAST  The various methods of treatment have been discussed with the patient and family. After consideration of risks, benefits and other options for treatment, the patient has consented to  Procedure(s): BILATERAL PLACEMENT OF BREAST IMPLANTS (Bilateral) saline as a surgical intervention .  The patient's history has been reviewed, patient examined, no change in status, stable for surgery.  I have reviewed the patient's chart and labs.  Questions were answered to the patient's satisfaction.     Shron Ozer
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
## 2015-10-20 NOTE — Transfer of Care (Signed)
Immediate Anesthesia Transfer of Care Note  Patient: Courtney Terry  Procedure(s) Performed: Procedure(s): BILATERAL PLACEMENT OF BREAST IMPLANTS (Bilateral)  Patient Location: PACU  Anesthesia Type:General  Level of Consciousness: awake, alert  and oriented  Airway & Oxygen Therapy: Patient Spontanous Breathing and Patient connected to nasal cannula oxygen  Post-op Assessment: Report given to RN and Post -op Vital signs reviewed and stable  Post vital signs: Reviewed and stable  Last Vitals:  Filed Vitals:   10/20/15 0842 10/20/15 1423  BP: 156/93   Pulse: 103 85  Temp: 36.7 C   Resp: 18 13    Complications: No apparent anesthesia complications

## 2015-10-20 NOTE — Anesthesia Procedure Notes (Signed)
Procedure Name: Intubation Date/Time: 10/20/2015 12:07 PM Performed by: Jenne Campus Pre-anesthesia Checklist: Patient identified, Emergency Drugs available, Suction available, Patient being monitored and Timeout performed Patient Re-evaluated:Patient Re-evaluated prior to inductionOxygen Delivery Method: Circle system utilized Preoxygenation: Pre-oxygenation with 100% oxygen Intubation Type: IV induction Ventilation: Mask ventilation without difficulty Laryngoscope Size: Miller and 2 Grade View: Grade I Tube type: Oral Tube size: 7.0 mm Number of attempts: 1 Airway Equipment and Method: Stylet and LTA kit utilized Placement Confirmation: ETT inserted through vocal cords under direct vision,  positive ETCO2,  CO2 detector and breath sounds checked- equal and bilateral Secured at: 22 cm Tube secured with: Tape Dental Injury: Teeth and Oropharynx as per pre-operative assessment

## 2015-10-20 NOTE — Anesthesia Postprocedure Evaluation (Signed)
Anesthesia Post Note  Patient: Courtney Terry  Procedure(s) Performed: Procedure(s) (LRB): BILATERAL PLACEMENT OF BREAST IMPLANTS (Bilateral)  Patient location during evaluation: PACU Anesthesia Type: General Level of consciousness: awake and alert Pain management: pain level controlled Vital Signs Assessment: post-procedure vital signs reviewed and stable Respiratory status: spontaneous breathing, nonlabored ventilation, respiratory function stable and patient connected to nasal cannula oxygen Cardiovascular status: blood pressure returned to baseline and stable Postop Assessment: no signs of nausea or vomiting Anesthetic complications: no    Last Vitals:  Filed Vitals:   10/20/15 1511 10/20/15 1526  BP: 162/93 174/94  Pulse: 75 71  Temp:    Resp: 9 9    Last Pain: There were no vitals filed for this visit.               Jedediah Noda,W. EDMOND

## 2015-10-20 NOTE — Progress Notes (Signed)
Pt states she ate a "tiny cracker" this morning around 0500. Dr Ola Spurr called and informed.

## 2015-10-20 NOTE — Op Note (Signed)
Operative Note   DATE OF OPERATION: 1.13.17  LOCATION: [facility] - inpatient  SURGICAL DIVISION: Plastic Surgery  PREOPERATIVE DIAGNOSES:  1. History right breast cancer 2. Acquired absence right breast 3. History bilateral breast implant explantation  POSTOPERATIVE DIAGNOSES:  same  PROCEDURE:  1. Placement right breast saline implant for reconstruction 2. Left breast augmentation with saline implant for symmetry  SURGEON: Irene Limbo MD MBA  ASSISTANT: none  ANESTHESIA:  General.   EBL: 50 ml  COMPLICATIONS: None immediate.   INDICATIONS FOR PROCEDURE:  The patient, Courtney Terry, is a 55 y.o. female born on 10/04/1961, is here for breast reconstruction following nipple sparing mastectomy on right. Her course was complicated by MRSA infection left breast and gross extrusion implant on right requiring bilateral removal in September 2016.    FINDINGS: Mentor Siltex Moderate Profile Saline implants. RIGHT 200 ml, fill volume 225 ml. Ref [id number] SN  [id number]. LEFT 175 ml, fill volume 175 ml. REF [id number] SN [id number]  DESCRIPTION OF PROCEDURE:  The patient's operative site was marked with the patient in the preoperative area in standing position to mark breast meridians, anterior axillary lines, sternal notch and chest midline The patient was taken to the operating room. SCDs were placed and IV antibiotics were given. The patient's operative site was prepped and draped in a sterile fashion. A time out was performed and all information was confirmed to be correct. I began on right breast with incision in inframammary fold scar. Mastectomy flap elevated off underlying pectoralis in limited fashion to identify lateral border of pectoralis muscle. Pectoralis muscle elevated off chest wall continuous with rectus fascia inferiorly and limited elevation serratus laterally. Sizer placed and incision tailor tacked closed. I then directed attention to left breast with incision in prior  inframammary fold scar. Incision carried through superficial fascia and anterolateral border of pectoralis identified. Submuscular dissection completed  Sizer placed and tailor tacked. Patient brought to sitting position. Patient assessed for symmetry final moderate profile, round 175 ml for left and 200 ml for right was selected.  Patient was returned to supine position and cavities irrigated with solution containing Ancef, bacitracin, and gentamicin. Hemostasis ensured. 15 Fr JP drain placed in each cavity. Final implants placed and filled to 175 ml on left and 225 ml over right. Closure completed with 3-0 vicryl to close superficial fascia. 4-0 vicryl used in dermis and skin closure completed with 4-0 monocryl subcuticular. Tissue adhesive applied followed by dry dressing and breast binder.   The patient was allowed to wake from anesthesia, extubated and taken to the recovery room in satisfactory condition.   SPECIMENS: none  DRAINS: 15 Fr JP in right and left breast  Irene Limbo, MD [facility] Plastic & Reconstructive Surgery [phone]

## 2015-10-21 LAB — CBC
HEMATOCRIT: 38.5 % (ref 36.0–46.0)
Hemoglobin: 12 g/dL (ref 12.0–15.0)
MCH: 26.7 pg (ref 26.0–34.0)
MCHC: 31.2 g/dL (ref 30.0–36.0)
MCV: 85.6 fL (ref 78.0–100.0)
Platelets: 211 10*3/uL (ref 150–400)
RBC: 4.5 MIL/uL (ref 3.87–5.11)
RDW: 15.3 % (ref 11.5–15.5)
WBC: 10.1 10*3/uL (ref 4.0–10.5)

## 2015-10-21 LAB — SURGICAL PCR SCREEN
MRSA, PCR: NEGATIVE
Staphylococcus aureus: NEGATIVE

## 2015-10-21 MED ORDER — OXYCODONE-ACETAMINOPHEN 5-325 MG PO TABS
1.0000 | ORAL_TABLET | ORAL | Status: DC | PRN
  Administered 2015-10-21 – 2015-10-22 (×4): 2 via ORAL
  Filled 2015-10-21 (×4): qty 2

## 2015-10-21 MED ORDER — DIPHENHYDRAMINE HCL 50 MG/ML IJ SOLN: 25.0000 mg | Freq: Four times a day (QID) | INTRAMUSCULAR | Status: DC | PRN

## 2015-10-21 MED ORDER — MORPHINE SULFATE (PF) 2 MG/ML IV SOLN: 1.0000 mg | INTRAVENOUS | Status: DC | PRN

## 2015-10-21 MED ORDER — DIPHENHYDRAMINE HCL 25 MG PO CAPS
25.0000 mg | ORAL_CAPSULE | Freq: Four times a day (QID) | ORAL | Status: DC | PRN
  Administered 2015-10-21: 25 mg via ORAL
  Filled 2015-10-21: qty 1

## 2015-10-21 NOTE — Progress Notes (Signed)
Pt c/o itching, Dr. Iran Planas paged.

## 2015-10-21 NOTE — Progress Notes (Signed)
  POD# 1 placement bilateral saline implants  Temp:  [97.3 F (36.3 C)-98.5 F (36.9 C)] 98.5 F (36.9 C) (01/14 0913) Pulse Rate:  [58-85] 73 (01/14 0913) Resp:  [7-31] 15 (01/14 0913) BP: (96-174)/(62-116) 107/62 mmHg (01/14 0913) SpO2:  [93 %-100 %] 100 % (01/14 0913)   PO 250 JP L 203 R 237  Developed itching, this has happened in past with dilaudid.  States not ambulated yet  PE Drains watery serosanguinous Dressings off in bed, incisions intact, dry Chest expected edema   A/P D/c dilaudid, morphine prn severe pain Hx of MRSA infection in past, review of culture 06/2015, sensitive to clindamycin and plan this when ready for d/c- patient has not taken these as prescribed in past and counseled if another infection will recommend to remove implants and not replace Continue Vanc while in hospital Ambulate  Irene Limbo, MD [facility] Plastic & Reconstructive Surgery [phone]

## 2015-10-21 NOTE — Progress Notes (Signed)
Patient had a past history of MRSA. Patient was rescreened with PCR, and the results were negative. Patient was not placed on contact. Jimmie Molly, RN

## 2015-10-21 NOTE — Plan of Care (Signed)
Problem: Skin Integrity: Goal: Demonstration of wound healing without infection will improve Outcome: Progressing Pt having itching, Benadryl obtained.

## 2015-10-22 LAB — CBC
HCT: 35.9 % — ABNORMAL LOW (ref 36.0–46.0)
HEMOGLOBIN: 11.4 g/dL — AB (ref 12.0–15.0)
MCH: 27 pg (ref 26.0–34.0)
MCHC: 31.8 g/dL (ref 30.0–36.0)
MCV: 85.1 fL (ref 78.0–100.0)
Platelets: 201 10*3/uL (ref 150–400)
RBC: 4.22 MIL/uL (ref 3.87–5.11)
RDW: 15.4 % (ref 11.5–15.5)
WBC: 9 10*3/uL (ref 4.0–10.5)

## 2015-10-22 MED ORDER — OXYCODONE-ACETAMINOPHEN 5-325 MG PO TABS: 1.0000 | ORAL_TABLET | ORAL | Status: DC | PRN

## 2015-10-22 MED ORDER — CLINDAMYCIN HCL 300 MG PO CAPS: 300.0000 mg | ORAL_CAPSULE | Freq: Three times a day (TID) | ORAL | Status: DC

## 2015-10-22 NOTE — Progress Notes (Signed)
Pt ready for DC, friend at bedside.  He demonstrated JP drain, how to empty and record, reinforced to take the JP measuring record chart to the DR with them when they go on the 19th.  Letter from Dr. Iran Planas given to pt as well as the copy of the DC instructions.  Rx for Percocet and Cleocin given and explained.  Pink breast binder on.  JPs emptied prior to DC.  No further questions from pt or friend.

## 2015-10-22 NOTE — Discharge Summary (Signed)
Physician Discharge Summary  Patient ID: Courtney Terry MRN: [id number] DOB/AGE: [birth year] 54 y.o.  Admit date: 10/20/2015 Discharge date: 10/22/2015  Admission Diagnoses: History breast cancer  Discharge Diagnoses:  Active Problems:   History of breast cancer   Discharged Condition: stable  Hospital Course: Patient developed severe itching with IV pain medications on POD#1, was transitioned to oral medication which she tolerated well with resolution itching. She was maintained on IV Vancomycin as she has history MRSA infection post last attempt for breast reconstruction. She will discharge on oral clindamycin as her last cultures were sensitive to this. Drain care teaching done prior to discharge and reinforced importance of recording output, as we have had problems with this in past.  Treatments: surgery: right breast reconstruction with saline implant, left breast augmentation for symmetry  Discharge Exam: Blood pressure 138/75, pulse 70, temperature 98.6 F (37 C), temperature source Oral, resp. rate 18, height 5\' 10"  (1.778 m), weight 49.896 kg (110 lb), SpO2 97 %. Incision/Wound: incisions dry intact, breasts soft, drains watery serosanguinous  Disposition: 01-Home or Self Care  Discharge Instructions    Call MD for:  redness, tenderness, or signs of infection (pain, swelling, bleeding, redness, odor or green/yellow discharge around incision site)    Complete by:  As directed      Call MD for:  temperature >100.5    Complete by:  As directed      Discharge instructions    Complete by:  As directed   Ok to shower. Soap and water ok. No creams or ointments over incision. Do not let drains dangle in shower, either hold them or attach to wash cloth around neck.  Strip and record drains twice daily, bring paper with amount of drainage emptied to clinic visit.   No strenuous activity, exercise or housework.  Please take the antibiotic pill three times per day.     Driving  Restrictions    Complete by:  As directed   No driving     Lifting restrictions    Complete by:  As directed   No lifting greater than 5 lbs     Resume previous diet    Complete by:  As directed             Medication List    TAKE these medications        albuterol (2.5 MG/3ML) 0.083% nebulizer solution  Commonly known as:  PROVENTIL  Take 3 mLs (2.5 mg total) by nebulization every 4 (four) hours as needed for wheezing or shortness of breath.     PROAIR HFA 108 (90 Base) MCG/ACT inhaler  Generic drug:  albuterol  INHALE 2 PUFFS INTO THE LUNGS EVERY 4 HOURS AS NEEDED FOR WHEEZING     anastrozole 1 MG tablet  Commonly known as:  ARIMIDEX  Take 1 tablet (1 mg total) by mouth daily.     clindamycin 300 MG capsule  Commonly known as:  CLEOCIN  Take 1 capsule (300 mg total) by mouth 3 (three) times daily.     divalproex 500 MG DR tablet  Commonly known as:  DEPAKOTE  Take 500 mg by mouth daily.     DULERA 200-5 MCG/ACT Aero  Generic drug:  mometasone-formoterol  INHALE 2 PUFFS INTO THE LUNGS TWICE A DAY     levothyroxine 75 MCG tablet  Commonly known as:  SYNTHROID, LEVOTHROID  Take 75 mcg by mouth daily.     oxyCODONE-acetaminophen 5-325 MG tablet  Commonly known as:  PERCOCET/ROXICET  Take 1-2 tablets by mouth every 4 (four) hours as needed for moderate pain.           Follow-up Information    Follow up with Irene Limbo, MD On 10/26/2015.   Specialty:  Plastic Surgery   Why:  12 45 pm   Contact information:   [place] [address] [phone]       Signed: Irene Limbo 10/22/2015, 9:23 AM

## 2015-10-23 ENCOUNTER — Encounter (HOSPITAL_COMMUNITY): Payer: Self-pay | Admitting: Plastic Surgery

## 2015-10-26 ENCOUNTER — Encounter (HOSPITAL_COMMUNITY): Payer: Self-pay | Admitting: Plastic Surgery

## 2015-11-06 ENCOUNTER — Encounter (HOSPITAL_BASED_OUTPATIENT_CLINIC_OR_DEPARTMENT_OTHER): Payer: Self-pay | Admitting: *Deleted

## 2015-11-06 NOTE — H&P (Signed)
  Subjective:    Patient ID: Courtney Terry is a 55 y.o. female.  HPI  2.5 weeks post op. Returns with concern drainage right breast after removing glue. No fevers  History significant for right NSM and TE reconstruction. Following implant exchange and left augmentation for symmetry, course complicated by gross exposure right breast implant and left breast MRSA infection, and underwent bilateral explant.   Represented with palpable mass that has been growing for 6 months. MMG/US 9 mm palpable mass in the 10 o'clock position of the right breast, 2 cm from the nipple, 6 mm intramammary node or cyst in the 10 o'clock position of the right breast, 1 cm from the nipple, No right axillary adenopathy. ER+/PR- Her 2 -. 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 Nodes negative. Oncotype 30 r. [facility] did not strongly recommend chemotherapy given pshycological issues and patient declined chemotherapy as well. On AI. Last MMG left 12/2014  History significant for diagnosis of right breast cancer 2013 and underwent SLN/lumpectomy. 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 ER+/PR- Her 2 neg. Oncotype score 25 at that time. Patient also referred to genetics and Rad Onc at that time. Patient states she was never told she needed radiation, also does not remember results of genetics test. Chart review states done at [facility] and was negative (no panel specified).   Prior 34 B. Mastectomy weight 113 g COPD, uses O2 nightly. Followed by [facility] for this. +Bipolar d/o, managed by PCP.  Review of Systems     Objective:   Physical Exam  Cardiovascular: Normal rate, regular rhythm and normal heart sounds.  Pulmonary/Chest: Effort normal and breath sounds normal.  Abdominal: Soft.   Chest: left breast incision intact Right chest with 5 mm opening incision with exposure implant, no cellulits, serous drainage on guaze   Assessment:     Right breast cancer -  recurrent  S/p NSM, TE reconstruction (serratus fascia/subpectoral) S/p implant exchange right, left augmentation with saline S/p removal bilateral breast implants, MRSA culture from left breast cavity S/p re placement bilateral saline implants  Plan:     Counseled patient this is recurrent exposure. By definition the implant is contaminated and recommend replacement or removal entirely given her difficult course with this. She desires keeping the implant in place. Counseled her I do not recommend trying to close skin over the implant given exposure. Plan surgery with overnight stay. Counseled I may not be able to place same volume in implant in order to allow for closure.   Mentor Siltex Moderate Profile Saline implants. RIGHT 200 ml, fill volume 225 ml. Ref [id number] SN [id number]. LEFT 175 ml, fill volume 175 ml.   Irene Limbo, MD [facility] Plastic & Reconstructive Surgery [phone]

## 2015-11-06 NOTE — Progress Notes (Signed)
Chart reviewed by Dr Rodman Comp, due to patient being on home 02 @2L /min 24 hrs a day, she needs to be done at main OR. Elmyra Ricks at Dr Para Skeans office notified.

## 2015-11-07 ENCOUNTER — Encounter (HOSPITAL_COMMUNITY)
Admission: RE | Disposition: A | Discharge status: Home / Self Care | Payer: Self-pay | Source: Ambulatory Visit | Attending: Plastic Surgery

## 2015-11-07 ENCOUNTER — Ambulatory Visit (HOSPITAL_COMMUNITY): Payer: Medicaid Other | Admitting: Anesthesiology

## 2015-11-07 ENCOUNTER — Observation Stay (HOSPITAL_BASED_OUTPATIENT_CLINIC_OR_DEPARTMENT_OTHER)
Admission: RE | Admit: 2015-11-07 | Discharge: 2015-11-08 | Disposition: A | Discharge status: Home / Self Care | Payer: Medicaid Other | Source: Ambulatory Visit | Attending: Plastic Surgery | Admitting: Plastic Surgery

## 2015-11-07 ENCOUNTER — Encounter (HOSPITAL_COMMUNITY): Payer: Self-pay | Admitting: Plastic Surgery

## 2015-11-07 DIAGNOSIS — F419 Anxiety disorder, unspecified: Secondary | ICD-10-CM | POA: Insufficient documentation

## 2015-11-07 DIAGNOSIS — Z853 Personal history of malignant neoplasm of breast: Secondary | ICD-10-CM | POA: Diagnosis not present

## 2015-11-07 DIAGNOSIS — Z8614 Personal history of Methicillin resistant Staphylococcus aureus infection: Secondary | ICD-10-CM | POA: Insufficient documentation

## 2015-11-07 DIAGNOSIS — D649 Anemia, unspecified: Secondary | ICD-10-CM | POA: Insufficient documentation

## 2015-11-07 DIAGNOSIS — J449 Chronic obstructive pulmonary disease, unspecified: Secondary | ICD-10-CM | POA: Insufficient documentation

## 2015-11-07 DIAGNOSIS — J45909 Unspecified asthma, uncomplicated: Secondary | ICD-10-CM | POA: Diagnosis not present

## 2015-11-07 DIAGNOSIS — E039 Hypothyroidism, unspecified: Secondary | ICD-10-CM | POA: Diagnosis not present

## 2015-11-07 DIAGNOSIS — T8131XA Disruption of external operation (surgical) wound, not elsewhere classified, initial encounter: Principal | ICD-10-CM | POA: Insufficient documentation

## 2015-11-07 DIAGNOSIS — F209 Schizophrenia, unspecified: Secondary | ICD-10-CM | POA: Insufficient documentation

## 2015-11-07 DIAGNOSIS — F319 Bipolar disorder, unspecified: Secondary | ICD-10-CM | POA: Insufficient documentation

## 2015-11-07 DIAGNOSIS — Z9011 Acquired absence of right breast and nipple: Secondary | ICD-10-CM | POA: Insufficient documentation

## 2015-11-07 DIAGNOSIS — M1990 Unspecified osteoarthritis, unspecified site: Secondary | ICD-10-CM | POA: Insufficient documentation

## 2015-11-07 DIAGNOSIS — Z170 Estrogen receptor positive status [ER+]: Secondary | ICD-10-CM | POA: Diagnosis not present

## 2015-11-07 DIAGNOSIS — Y838 Other surgical procedures as the cause of abnormal reaction of the patient, or of later complication, without mention of misadventure at the time of the procedure: Secondary | ICD-10-CM | POA: Diagnosis not present

## 2015-11-07 DIAGNOSIS — Z9981 Dependence on supplemental oxygen: Secondary | ICD-10-CM | POA: Insufficient documentation

## 2015-11-07 DIAGNOSIS — N651 Disproportion of reconstructed breast: Secondary | ICD-10-CM | POA: Diagnosis not present

## 2015-11-07 HISTORY — PX: BREAST IMPLANT REMOVAL: SHX5361

## 2015-11-07 LAB — COMPREHENSIVE METABOLIC PANEL
ALK PHOS: 89 U/L (ref 38–126)
ALT: 13 U/L — AB (ref 14–54)
AST: 22 U/L (ref 15–41)
Albumin: 3.4 g/dL — ABNORMAL LOW (ref 3.5–5.0)
Anion gap: 13 (ref 5–15)
BUN: 19 mg/dL (ref 6–20)
CALCIUM: 9.4 mg/dL (ref 8.9–10.3)
CO2: 24 mmol/L (ref 22–32)
CREATININE: 0.64 mg/dL (ref 0.44–1.00)
Chloride: 104 mmol/L (ref 101–111)
GFR calc non Af Amer: >60 mL/min (ref >60)
GLUCOSE: 79 mg/dL (ref 65–99)
Potassium: 4.4 mmol/L (ref 3.5–5.1)
Sodium: 141 mmol/L (ref 135–145)
Total Bilirubin: 0.3 mg/dL (ref 0.3–1.2)
Total Protein: 6.6 g/dL (ref 6.5–8.1)

## 2015-11-07 LAB — CBC
HCT: 40.8 % (ref 36.0–46.0)
Hemoglobin: 13.1 g/dL (ref 12.0–15.0)
MCH: 26.6 pg (ref 26.0–34.0)
MCHC: 32.1 g/dL (ref 30.0–36.0)
MCV: 82.9 fL (ref 78.0–100.0)
PLATELETS: 446 10*3/uL — AB (ref 150–400)
RBC: 4.92 MIL/uL (ref 3.87–5.11)
RDW: 15.2 % (ref 11.5–15.5)
WBC: 9.9 10*3/uL (ref 4.0–10.5)

## 2015-11-07 SURGERY — REMOVAL, IMPLANT, BREAST
Anesthesia: General | Site: Breast | Laterality: Right

## 2015-11-07 MED ORDER — VANCOMYCIN HCL IN DEXTROSE 1-5 GM/200ML-% IV SOLN
1000.0000 mg | INTRAVENOUS | Status: DC
Start: 2015-11-07 — End: 2015-11-07

## 2015-11-07 MED ORDER — KCL IN DEXTROSE-NACL 20-5-0.45 MEQ/L-%-% IV SOLN
INTRAVENOUS | Status: DC
  Administered 2015-11-08: 01:00:00 via INTRAVENOUS
  Filled 2015-11-07: qty 1000

## 2015-11-07 MED ORDER — OXYCODONE-ACETAMINOPHEN 5-325 MG PO TABS: 1.0000 | ORAL_TABLET | ORAL | Status: DC | PRN

## 2015-11-07 MED ORDER — SUCCINYLCHOLINE CHLORIDE 20 MG/ML IJ SOLN
INTRAMUSCULAR | Status: DC | PRN
  Administered 2015-11-07: 100 mg via INTRAVENOUS

## 2015-11-07 MED ORDER — LACTATED RINGERS IV SOLN
INTRAVENOUS | Status: DC | PRN
  Administered 2015-11-07 (×2): via INTRAVENOUS

## 2015-11-07 MED ORDER — EPHEDRINE SULFATE 50 MG/ML IJ SOLN
INTRAMUSCULAR | Status: AC
  Filled 2015-11-07: qty 1

## 2015-11-07 MED ORDER — ARTIFICIAL TEARS OP OINT
TOPICAL_OINTMENT | OPHTHALMIC | Status: AC
  Filled 2015-11-07: qty 3.5

## 2015-11-07 MED ORDER — EPHEDRINE SULFATE 50 MG/ML IJ SOLN
INTRAMUSCULAR | Status: DC | PRN
  Administered 2015-11-07 (×2): 10 mg via INTRAVENOUS

## 2015-11-07 MED ORDER — ALBUTEROL SULFATE (2.5 MG/3ML) 0.083% IN NEBU: 2.5000 mg | INHALATION_SOLUTION | RESPIRATORY_TRACT | Status: DC | PRN

## 2015-11-07 MED ORDER — LEVOTHYROXINE SODIUM 75 MCG PO TABS
75.0000 ug | ORAL_TABLET | Freq: Every day | ORAL | Status: DC
  Administered 2015-11-08: 75 ug via ORAL
  Filled 2015-11-07: qty 1

## 2015-11-07 MED ORDER — LIDOCAINE HCL (CARDIAC) 20 MG/ML IV SOLN
INTRAVENOUS | Status: AC
  Filled 2015-11-07: qty 5

## 2015-11-07 MED ORDER — CLINDAMYCIN HCL 300 MG PO CAPS: 300.0000 mg | ORAL_CAPSULE | Freq: Three times a day (TID) | ORAL | Status: DC

## 2015-11-07 MED ORDER — VANCOMYCIN HCL IN DEXTROSE 1-5 GM/200ML-% IV SOLN
INTRAVENOUS | Status: AC
  Administered 2015-11-07: 1000 mg via INTRAVENOUS
  Filled 2015-11-07: qty 200

## 2015-11-07 MED ORDER — HYDROCODONE-ACETAMINOPHEN 7.5-325 MG PO TABS
1.0000 | ORAL_TABLET | Freq: Once | ORAL | Status: DC | PRN
Start: 2015-11-07 — End: 2015-11-07

## 2015-11-07 MED ORDER — DIPHENHYDRAMINE HCL 50 MG/ML IJ SOLN: 25.0000 mg | Freq: Four times a day (QID) | INTRAMUSCULAR | Status: DC | PRN

## 2015-11-07 MED ORDER — MOMETASONE FURO-FORMOTEROL FUM 200-5 MCG/ACT IN AERO
2.0000 | INHALATION_SPRAY | Freq: Two times a day (BID) | RESPIRATORY_TRACT | Status: DC
  Administered 2015-11-08: 2 via RESPIRATORY_TRACT
  Filled 2015-11-07: qty 8.8

## 2015-11-07 MED ORDER — SODIUM CHLORIDE 0.9 % IR SOLN
Status: DC | PRN
  Administered 2015-11-07: 200 mL

## 2015-11-07 MED ORDER — FENTANYL CITRATE (PF) 250 MCG/5ML IJ SOLN
INTRAMUSCULAR | Status: AC
  Filled 2015-11-07: qty 5

## 2015-11-07 MED ORDER — SUCCINYLCHOLINE CHLORIDE 20 MG/ML IJ SOLN
INTRAMUSCULAR | Status: AC
  Filled 2015-11-07: qty 1

## 2015-11-07 MED ORDER — PHENYLEPHRINE HCL 10 MG/ML IJ SOLN
INTRAMUSCULAR | Status: DC | PRN
  Administered 2015-11-07 (×2): 80 ug via INTRAVENOUS

## 2015-11-07 MED ORDER — ONDANSETRON HCL 4 MG/2ML IJ SOLN
INTRAMUSCULAR | Status: AC
  Filled 2015-11-07: qty 2

## 2015-11-07 MED ORDER — CHLORHEXIDINE GLUCONATE 4 % EX LIQD: 1.0000 "application " | Freq: Once | CUTANEOUS | Status: DC

## 2015-11-07 MED ORDER — LIDOCAINE HCL (CARDIAC) 20 MG/ML IV SOLN
INTRAVENOUS | Status: DC | PRN
  Administered 2015-11-07: 50 mg via INTRAVENOUS

## 2015-11-07 MED ORDER — ONDANSETRON 4 MG PO TBDP: 4.0000 mg | ORAL_TABLET | Freq: Four times a day (QID) | ORAL | Status: DC | PRN

## 2015-11-07 MED ORDER — PHENYLEPHRINE 40 MCG/ML (10ML) SYRINGE FOR IV PUSH (FOR BLOOD PRESSURE SUPPORT)
PREFILLED_SYRINGE | INTRAVENOUS | Status: AC
  Filled 2015-11-07: qty 10

## 2015-11-07 MED ORDER — DIPHENHYDRAMINE HCL 25 MG PO CAPS: 25.0000 mg | ORAL_CAPSULE | Freq: Four times a day (QID) | ORAL | Status: DC | PRN

## 2015-11-07 MED ORDER — HYDROMORPHONE HCL 1 MG/ML IJ SOLN: 0.2500 mg | INTRAMUSCULAR | Status: DC | PRN

## 2015-11-07 MED ORDER — SODIUM CHLORIDE 0.9 % IV SOLN
500.0000 mg | Freq: Two times a day (BID) | INTRAVENOUS | Status: DC
  Administered 2015-11-08: 500 mg via INTRAVENOUS
  Filled 2015-11-07 (×2): qty 500

## 2015-11-07 MED ORDER — GENTAMICIN SULFATE 40 MG/ML IJ SOLN
Freq: Once | INTRAMUSCULAR | Status: DC
  Filled 2015-11-07: qty 1

## 2015-11-07 MED ORDER — DIVALPROEX SODIUM 500 MG PO DR TAB
500.0000 mg | DELAYED_RELEASE_TABLET | Freq: Every day | ORAL | Status: DC
  Filled 2015-11-07: qty 1

## 2015-11-07 MED ORDER — ONDANSETRON HCL 4 MG/2ML IJ SOLN
INTRAMUSCULAR | Status: DC | PRN
  Administered 2015-11-07: 4 mg via INTRAVENOUS

## 2015-11-07 MED ORDER — FENTANYL CITRATE (PF) 100 MCG/2ML IJ SOLN
INTRAMUSCULAR | Status: DC | PRN
  Administered 2015-11-07: 150 ug via INTRAVENOUS

## 2015-11-07 MED ORDER — MIDAZOLAM HCL 2 MG/2ML IJ SOLN
INTRAMUSCULAR | Status: AC
  Filled 2015-11-07: qty 2

## 2015-11-07 MED ORDER — SODIUM CHLORIDE 0.9 % IV SOLN
INTRAVENOUS | Status: DC | PRN
  Administered 2015-11-07: 1000 mL

## 2015-11-07 MED ORDER — ANASTROZOLE 1 MG PO TABS: 1.0000 mg | ORAL_TABLET | Freq: Every day | ORAL | Status: DC

## 2015-11-07 MED ORDER — ALBUTEROL SULFATE HFA 108 (90 BASE) MCG/ACT IN AERS: 1.0000 | INHALATION_SPRAY | RESPIRATORY_TRACT | Status: DC | PRN

## 2015-11-07 MED ORDER — GLYCOPYRROLATE 0.2 MG/ML IJ SOLN
INTRAMUSCULAR | Status: AC
  Filled 2015-11-07: qty 1

## 2015-11-07 MED ORDER — ONDANSETRON HCL 4 MG/2ML IJ SOLN: 4.0000 mg | Freq: Four times a day (QID) | INTRAMUSCULAR | Status: DC | PRN

## 2015-11-07 MED ORDER — PROPOFOL 10 MG/ML IV BOLUS
INTRAVENOUS | Status: DC | PRN
  Administered 2015-11-07: 100 mg via INTRAVENOUS

## 2015-11-07 MED ORDER — MIDAZOLAM HCL 5 MG/5ML IJ SOLN
INTRAMUSCULAR | Status: DC | PRN
  Administered 2015-11-07: 2 mg via INTRAVENOUS

## 2015-11-07 MED ORDER — LACTATED RINGERS IV SOLN: INTRAVENOUS | Status: DC

## 2015-11-07 MED ORDER — ALBUMIN HUMAN 5 % IV SOLN
INTRAVENOUS | Status: DC | PRN
  Administered 2015-11-07: 14:00:00 via INTRAVENOUS

## 2015-11-07 MED ORDER — OXYCODONE-ACETAMINOPHEN 5-325 MG PO TABS
1.0000 | ORAL_TABLET | ORAL | Status: DC | PRN
  Administered 2015-11-07 – 2015-11-08 (×2): 2 via ORAL
  Filled 2015-11-07 (×2): qty 2

## 2015-11-07 MED ORDER — MORPHINE SULFATE (PF) 2 MG/ML IV SOLN
1.0000 mg | INTRAVENOUS | Status: DC | PRN
  Administered 2015-11-08: 2 mg via INTRAVENOUS
  Filled 2015-11-07: qty 1

## 2015-11-07 MED ORDER — 0.9 % SODIUM CHLORIDE (POUR BTL) OPTIME
TOPICAL | Status: DC | PRN
  Administered 2015-11-07: 1000 mL

## 2015-11-07 MED ORDER — PROPOFOL 10 MG/ML IV BOLUS
INTRAVENOUS | Status: AC
  Filled 2015-11-07: qty 20

## 2015-11-07 MED ORDER — PROMETHAZINE HCL 25 MG/ML IJ SOLN: 6.2500 mg | INTRAMUSCULAR | Status: DC | PRN

## 2015-11-07 SURGICAL SUPPLY — 50 items
ADH SKN CLS APL DERMABOND .7 (GAUZE/BANDAGES/DRESSINGS) ×1
BAG DECANTER FOR FLEXI CONT (MISCELLANEOUS) ×2 IMPLANT
BINDER BREAST LRG (GAUZE/BANDAGES/DRESSINGS) ×1 IMPLANT
BINDER BREAST XLRG (GAUZE/BANDAGES/DRESSINGS) IMPLANT
BLADE 10 SAFETY STRL DISP (BLADE) ×2 IMPLANT
CANISTER SUCTION 2500CC (MISCELLANEOUS) ×2 IMPLANT
CHLORAPREP W/TINT 26ML (MISCELLANEOUS) ×2 IMPLANT
COVER SURGICAL LIGHT HANDLE (MISCELLANEOUS) ×2 IMPLANT
DERMABOND ADVANCED (GAUZE/BANDAGES/DRESSINGS) ×1
DERMABOND ADVANCED .7 DNX12 (GAUZE/BANDAGES/DRESSINGS) ×1 IMPLANT
DRAIN CHANNEL 15F RND FF W/TCR (WOUND CARE) ×2 IMPLANT
DRAPE ORTHO SPLIT 77X108 STRL (DRAPES) ×4
DRAPE SURG ORHT 6 SPLT 77X108 (DRAPES) ×2 IMPLANT
DRAPE UTILITY XL STRL (DRAPES) ×1 IMPLANT
DRAPE WARM FLUID 44X44 (DRAPE) ×2 IMPLANT
DRSG PAD ABDOMINAL 8X10 ST (GAUZE/BANDAGES/DRESSINGS) ×3 IMPLANT
ELECT BLADE 4.0 EZ CLEAN MEGAD (MISCELLANEOUS) ×2
ELECT REM PT RETURN 9FT ADLT (ELECTROSURGICAL) ×2
ELECTRODE BLDE 4.0 EZ CLN MEGD (MISCELLANEOUS) ×1 IMPLANT
ELECTRODE REM PT RTRN 9FT ADLT (ELECTROSURGICAL) ×1 IMPLANT
EVACUATOR SILICONE 100CC (DRAIN) ×2 IMPLANT
GAUZE SPONGE 4X4 12PLY STRL (GAUZE/BANDAGES/DRESSINGS) ×2 IMPLANT
GLOVE BIO SURGEON STRL SZ 6 (GLOVE) ×2 IMPLANT
GLOVE SURG SS PI 6.0 STRL IVOR (GLOVE) ×2 IMPLANT
GLOVE SURG SS PI 6.5 STRL IVOR (GLOVE) ×2 IMPLANT
GOWN STRL REUS W/ TWL LRG LVL3 (GOWN DISPOSABLE) ×2 IMPLANT
GOWN STRL REUS W/TWL LRG LVL3 (GOWN DISPOSABLE) ×4
IMPL SALINE SMOOTH RND 200C (Breast) IMPLANT
IMPLANT SALINE SMOOTH RND 200C (Breast) ×2 IMPLANT
KIT BASIN OR (CUSTOM PROCEDURE TRAY) ×2 IMPLANT
KIT FILL SYSTEM UNIVERSAL (SET/KITS/TRAYS/PACK) ×1 IMPLANT
KIT ROOM TURNOVER OR (KITS) ×2 IMPLANT
NS IRRIG 1000ML POUR BTL (IV SOLUTION) ×4 IMPLANT
PACK GENERAL/GYN (CUSTOM PROCEDURE TRAY) ×2 IMPLANT
PAD ARMBOARD 7.5X6 YLW CONV (MISCELLANEOUS) ×2 IMPLANT
PIN SAFETY STERILE (MISCELLANEOUS) ×2 IMPLANT
SOLUTION BETADINE 4OZ (MISCELLANEOUS) IMPLANT
STAPLER VISISTAT 35W (STAPLE) ×2 IMPLANT
STRIP CLOSURE SKIN 1/2X4 (GAUZE/BANDAGES/DRESSINGS) ×1 IMPLANT
SUT ETHILON 2 0 FS 18 (SUTURE) ×1 IMPLANT
SUT MNCRL AB 4-0 PS2 18 (SUTURE) ×2 IMPLANT
SUT VIC AB 3-0 PS2 18 (SUTURE) ×2
SUT VIC AB 3-0 PS2 18XBRD (SUTURE) ×1 IMPLANT
SUT VIC AB 3-0 SH 27 (SUTURE) ×2
SUT VIC AB 3-0 SH 27X BRD (SUTURE) IMPLANT
SUT VIC AB 4-0 PS2 27 (SUTURE) ×3 IMPLANT
SYR BULB IRRIGATION 50ML (SYRINGE) ×1 IMPLANT
TOWEL OR 17X24 6PK STRL BLUE (TOWEL DISPOSABLE) ×2 IMPLANT
TOWEL OR 17X26 10 PK STRL BLUE (TOWEL DISPOSABLE) ×2 IMPLANT
TRAY FOLEY CATH 14FRSI W/METER (CATHETERS) IMPLANT

## 2015-11-07 NOTE — Op Note (Signed)
Operative Note   DATE OF OPERATION: 1.31.17  LOCATION: [facility] OR- observation  SURGICAL DIVISION: Plastic Surgery  PREOPERATIVE DIAGNOSES:  1. Exposure right breast implant, dehiscence surgical incisions 2. History breast cancer 3. Acquired absence breast  POSTOPERATIVE DIAGNOSES:  same  PROCEDURE:  Removal, replacement saline breast implant right  SURGEON: Irene Limbo MD MBA  ASSISTANT: none  ANESTHESIA:  General.   EBL: minimal  COMPLICATIONS: None immediate.   INDICATIONS FOR PROCEDURE:  The patient, Courtney Terry, is a 54 y.o. female born on 06/06/1961. She presents 2.5 weeks following bilateral implant placement for a history right breast cancer and nipple sparing mastectomy.    FINDINGS: Thin mastectomy flaps, 0.5 mm area open incision with exposure implant. No cellulitis or significant fluid in pocket. New Mentor Moderate Profile Smooth Round Saline 200 ml implant placed, fill volume 200 ml. Ref [id number] SN [id number]  DESCRIPTION OF PROCEDURE:  The patient's operative site was marked with the patient in the preoperative area. The patient was taken to the operating room. SCDs were placed and IV antibiotics were given. The patient's operative site was prepped and draped in a sterile fashion. A time out was performed and all information was confirmed to be correct. Wound margins excised and remainder inframammary incisions opened. Implant removed. Examination of cavity with small amount clear serous fluid and developing capsule. Capsulotomies performed superiorly in submuscular plane. Cavity irrigated with solution containing Ancef, gentamicin and bacitracin. Cavity inspected for hemostasis. Implant prepared and placed in cavity, filled to 200 ml. I elected to place smaller volume than preoperative to reduce tension on incision closure. Closure completed with running 3-0 vicryl in superficial fascia and dermis. Additional 4-0 vicryl interrupted placed in dermis. Skin closure  completed with running 4-0 nylon. Dermabond and steri strips applied, followed by dry dressing, breast binder.   The patient was allowed to wake from anesthesia, extubated and taken to the recovery room in satisfactory condition.   SPECIMENS:none  DRAINS: none  Irene Limbo, MD [facility] Plastic & Reconstructive Surgery [phone]

## 2015-11-07 NOTE — Progress Notes (Signed)
Dr Ermalene Postin informed of new raised rash on both hands,back. Dr Iran Planas also paged.
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
## 2015-11-07 NOTE — Transfer of Care (Signed)
Immediate Anesthesia Transfer of Care Note  Patient: Courtney Terry  Procedure(s) Performed: Procedure(s): REMOVAL RIGHT BREAST IMPLANT, REPLACEMENT OF RIGHT BREAST IMPLANT (Right)  Patient Location: PACU  Anesthesia Type:General  Level of Consciousness: awake, alert  and oriented  Airway & Oxygen Therapy: Patient Spontanous Breathing and Patient connected to nasal cannula oxygen  Post-op Assessment: Report given to RN and Post -op Vital signs reviewed and stable  Post vital signs: Reviewed and stable  Last Vitals:  Filed Vitals:   11/07/15 1147  BP: 115/69  Pulse: 61  Temp: 36.6 C  Resp: 18    Complications: No apparent anesthesia complications

## 2015-11-07 NOTE — Interval H&P Note (Signed)
History and Physical Interval Note:  11/07/2015 8:19 AM  Courtney Terry  has presented today for surgery, with the diagnosis of ACQUIRED ABSENCE RIGHT NIPPLE AND BREAST, HISTORY OF BREAST CANCER  The various methods of treatment have been discussed with the patient and family. After consideration of risks, benefits and other options for treatment, the patient has consented to  Procedure(s): REMOVAL RIGHT BREAST IMPLANT, REPLACEMENT OF RIGHT BREAST IMPLANT (Right) SALINE as a surgical intervention .  The patient's history has been reviewed, patient examined, no change in status, stable for surgery.  I have reviewed the patient's chart and labs.  Questions were answered to the patient's satisfaction.     Shaquandra Galano

## 2015-11-07 NOTE — Progress Notes (Signed)
Off monitors awaiting transfer to floor

## 2015-11-07 NOTE — Anesthesia Procedure Notes (Signed)
Procedure Name: Intubation Date/Time: 11/07/2015 2:28 PM Performed by: Eligha Bridegroom Pre-anesthesia Checklist: Emergency Drugs available, Timeout performed, Patient identified, Suction available and Patient being monitored Patient Re-evaluated:Patient Re-evaluated prior to inductionOxygen Delivery Method: Circle system utilized Preoxygenation: Pre-oxygenation with 100% oxygen Intubation Type: IV induction Laryngoscope Size: Mac and 3 Tube size: 7.0 mm Number of attempts: 1 Airway Equipment and Method: Stylet and LTA kit utilized Placement Confirmation: ETT inserted through vocal cords under direct vision,  breath sounds checked- equal and bilateral and positive ETCO2 Secured at: 21 cm Tube secured with: Tape Dental Injury: Teeth and Oropharynx as per pre-operative assessment

## 2015-11-07 NOTE — Anesthesia Preprocedure Evaluation (Addendum)
Anesthesia Evaluation  Patient identified by MRN, date of birth, ID band Patient awake    Reviewed: Allergy & Precautions, H&P , NPO status , Patient's Chart, lab work & pertinent test results  Airway Mallampati: I  TM Distance: >3 FB Neck ROM: Full    Dental no notable dental hx. (+) Edentulous Upper, Dental Advisory Given   Pulmonary shortness of breath, asthma , COPD,  COPD inhaler and oxygen dependent, former smoker,    Pulmonary exam normal breath sounds clear to auscultation       Cardiovascular negative cardio ROS   Rhythm:Regular Rate:Normal     Neuro/Psych Seizures -, Well Controlled,  PSYCHIATRIC DISORDERS Anxiety Depression Bipolar Disorder Schizophrenia PTSD   GI/Hepatic negative GI ROS, Neg liver ROS,   Endo/Other  Hypothyroidism   Renal/GU negative Renal ROS  negative genitourinary   Musculoskeletal  (+) Arthritis , Osteoarthritis,    Abdominal   Peds  Hematology negative hematology ROS (+) anemia ,   Anesthesia Other Findings   Reproductive/Obstetrics negative OB ROS                            Lab Results  Component Value Date   WBC 9.0 10/22/2015   HGB 11.4* 10/22/2015   HCT 35.9* 10/22/2015   MCV 85.1 10/22/2015   PLT 201 10/22/2015   Lab Results  Component Value Date   CREATININE 0.67 10/20/2015   BUN 14 10/20/2015   NA 142 10/20/2015   K 3.7 10/20/2015   CL 108 10/20/2015   CO2 28 10/20/2015    Anesthesia Physical  Anesthesia Plan  ASA: III  Anesthesia Plan: General   Post-op Pain Management:    Induction: Intravenous  Airway Management Planned: LMA  Additional Equipment:   Intra-op Plan:   Post-operative Plan: Extubation in OR  Informed Consent: I have reviewed the patients History and Physical, chart, labs and discussed the procedure including the risks, benefits and alternatives for the proposed anesthesia with the patient or authorized  representative who has indicated his/her understanding and acceptance.   Dental advisory given  Plan Discussed with: CRNA  Anesthesia Plan Comments:         Anesthesia Quick Evaluation

## 2015-11-08 ENCOUNTER — Encounter (HOSPITAL_COMMUNITY): Payer: Self-pay | Admitting: General Practice

## 2015-11-08 DIAGNOSIS — T8131XA Disruption of external operation (surgical) wound, not elsewhere classified, initial encounter: Secondary | ICD-10-CM | POA: Diagnosis not present

## 2015-11-08 MED ORDER — CLINDAMYCIN HCL 300 MG PO CAPS: 300.0000 mg | ORAL_CAPSULE | Freq: Three times a day (TID) | ORAL | Status: DC

## 2015-11-08 MED ORDER — OXYCODONE-ACETAMINOPHEN 10-325 MG PO TABS: 1.0000 | ORAL_TABLET | ORAL | Status: DC | PRN

## 2015-11-08 NOTE — Anesthesia Postprocedure Evaluation (Signed)
Anesthesia Post Note  Patient: Courtney Terry  Procedure(s) Performed: Procedure(s) (LRB): REMOVAL RIGHT BREAST IMPLANT, REPLACEMENT OF RIGHT BREAST IMPLANT (Right)  Patient location during evaluation: PACU Anesthesia Type: General Level of consciousness: awake and alert Pain management: pain level controlled Vital Signs Assessment: post-procedure vital signs reviewed and stable Respiratory status: spontaneous breathing Cardiovascular status: blood pressure returned to baseline Anesthetic complications: no    Last Vitals:  Filed Vitals:   11/08/15 0147 11/08/15 0538  BP: 100/66 118/71  Pulse: 76 72  Temp: 37 C 37.2 C  Resp: 19 19    Last Pain:  Filed Vitals:   11/08/15 0913  PainSc: 10-Worst pain ever                 Tiajuana Amass

## 2015-11-08 NOTE — Progress Notes (Signed)
Pt discharged to home.  Discharge instructions explained to pt.  Pt has no questions at the time of discharge.  Pt states she has all belongings.  IV removed.  Pt taken off unit via wheelchair by volunteer services.   

## 2015-11-08 NOTE — Discharge Summary (Signed)
Physician Discharge Summary  Patient ID: Courtney Terry MRN: [id number] DOB/AGE: [birth year] 55 y.o.  Admit date: 11/07/2015 Discharge date: 11/08/2015  Admission Diagnoses: History breast cancer, dehiscence incision  Discharge Diagnoses:  Active Problems:   History of breast cancer   Discharged Condition: stable  Hospital Course: Admitted following dehiscence recent surgical incision with exposure implant. Underwent implant exchange. Perioperative coverage with vancomycin given history MRSA.  Treatments: surgery: removal and replacement right breast saline implant  Discharge Exam: Blood pressure 118/71, pulse 72, temperature 99 F (37.2 C), temperature source Oral, resp. rate 19, height 5\' 10"  (1.778 m), weight 49.896 kg (110 lb), SpO2 98 %. Incision/Wound:dressing dry, breasts no erythema, soft; steris in place on right dry  Disposition: 01-Home or Self Care  Discharge Instructions    Call MD for:  redness, tenderness, or signs of infection (pain, swelling, bleeding, redness, odor or green/yellow discharge around incision site)    Complete by:  As directed      Call MD for:  temperature >100.5    Complete by:  As directed      Discharge instructions    Complete by:  As directed   Ok to shower am 11/09/15. Pat steri strips (white tapes) dry.  Soap and water ok.  No strenuous activity or housework. Breast binder or soft compression bra. No underwire.     Discharge patient    Complete by:  As directed      Lifting restrictions    Complete by:  As directed   No lifting greater than 5 lbs     Resume previous diet    Complete by:  As directed             Medication List    STOP taking these medications        oxyCODONE-acetaminophen 5-325 MG tablet  Commonly known as:  PERCOCET/ROXICET  Replaced by:  oxyCODONE-acetaminophen 10-325 MG tablet      TAKE these medications        albuterol (2.5 MG/3ML) 0.083% nebulizer solution  Commonly known as:  PROVENTIL  Take 3  mLs (2.5 mg total) by nebulization every 4 (four) hours as needed for wheezing or shortness of breath.     PROAIR HFA 108 (90 Base) MCG/ACT inhaler  Generic drug:  albuterol  INHALE 2 PUFFS INTO THE LUNGS EVERY 4 HOURS AS NEEDED FOR WHEEZING     anastrozole 1 MG tablet  Commonly known as:  ARIMIDEX  Take 1 tablet (1 mg total) by mouth daily.     clindamycin 300 MG capsule  Commonly known as:  CLEOCIN  Take 1 capsule (300 mg total) by mouth 3 (three) times daily.     divalproex 500 MG DR tablet  Commonly known as:  DEPAKOTE  Take 500 mg by mouth daily.     DULERA 200-5 MCG/ACT Aero  Generic drug:  mometasone-formoterol  INHALE 2 PUFFS INTO THE LUNGS TWICE A DAY     levothyroxine 75 MCG tablet  Commonly known as:  SYNTHROID, LEVOTHROID  Take 75 mcg by mouth daily.     oxyCODONE-acetaminophen 10-325 MG tablet  Commonly known as:  PERCOCET  Take 1 tablet by mouth every 4 (four) hours as needed for pain.     OXYGEN  Inhale 2 L into the lungs continuous.           Follow-up Information    Follow up with Irene Limbo, MD On 11/15/2015.   Specialty:  Plastic Surgery   Why:  [phone]  pm   Contact information:   [address] [place] [address] [phone]       Signed: Irene Limbo 11/08/2015, 7:40 AM

## 2015-12-04 ENCOUNTER — Telehealth: Payer: Self-pay

## 2015-12-04 NOTE — Telephone Encounter (Signed)
Patient called requesting that a prescription for vanilla ensure be [facility]ed [facility] this morning.  Fax# [phone].  They will be delivering her oxygen today at 12-1 today and she was told that Dr. Burr Medico had taken care of this however home care never received anything for the ensure.

## 2015-12-05 ENCOUNTER — Telehealth: Payer: Self-pay

## 2015-12-05 ENCOUNTER — Other Ambulatory Visit: Payer: Self-pay | Admitting: *Deleted

## 2015-12-05 DIAGNOSIS — C50419 Malignant neoplasm of upper-outer quadrant of unspecified female breast: Secondary | ICD-10-CM

## 2015-12-05 MED ORDER — ENSURE PLUS PO LIQD: 1.0000 | Freq: Every day | ORAL | Status: DC | PRN

## 2015-12-05 NOTE — Telephone Encounter (Signed)
Patient calling again today requesting a prescription be sent to [facility] for 60 bottles of vanilla ensure.  Fax# [phone].  Patient would like a return call this time after the fax has been [phone] verifying that the request was sent.

## 2015-12-05 NOTE — Telephone Encounter (Signed)
Faxed script for Ensure Plus to [facility] as per pt's request.  Called pt and informed her of same info.

## 2016-01-05 ENCOUNTER — Encounter: Payer: Self-pay | Admitting: *Deleted

## 2016-01-05 NOTE — Progress Notes (Signed)
Cutter Work  Clinical Social Work received voicemail from patient requesting assistance finding new housing.  CSW contacted patient by phone.  Ms. Berkner reported she needed to find new housing because she was not getting along with other tenants in the building.  CSW provided patient with information on [employer], an organization that provides information to assist in acquiring housing.   Polo Riley, MSW, LCSW, OSW-C Clinical Social Worker [facility] [phone]

## 2016-02-19 NOTE — H&P (Signed)
  Subjective:    Patient ID: Courtney Terry is a 55 y.o. female.  HPI  5 months post op from placement bilateral implants. Course complicated by exposure of right breast implant, and now 3 months post removal RIGHT implant. Reports has new nurse aide which is the friend that has been accompanying her to visits.  History significant for right NSM and TE reconstruction. Following implant exchange and left augmentation for symmetry, course complicated by gross exposure right breast implant and left breast MRSA infection, and underwent bilateral explant.   Represented with palpable mass that has been growing for 6 months. MMG/US 9 mm palpable mass in the 10 o'clock position of the right breast, 2 cm from the nipple, 6 mm intramammary node or cyst in the 10 o'clock position of the right breast, 1 cm from the nipple, No right axillary adenopathy. ER+/PR- Her 2 -. 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 Nodes negative. Oncotype 30 r. Burr Medico did not strongly recommend chemotherapy given pshycological issues and patient declined chemotherapy as well. On AI. Last MMG left 12/2014  History significant for diagnosis of right breast cancer 2013 and underwent SLN/lumpectomy. 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 ER+/PR- Her 2 neg. Oncotype score 25 at that time. Patient also referred to genetics and Rad Onc at that time. Patient states she was never told she needed radiation, also does not remember results of genetics test. Chart review states done at [facility] and was negative (no panel specified).   Prior 34 B. Mastectomy weight 113 g COPD. Followed by [facility] for this. +Bipolar d/o, managed by PCP.     Objective:   Physical Exam CV: normal heart sounds PULM: clear to auscultation Chest: left breast soft, able to palpate fold medial upper implant, no contracture Right chest soft tissue envelope contracted and adherent to chest wall, still some  induration  SN to nipple R 16 cm L18 cm BW right 9 L 10 cm Assessment:     Right breast cancer - recurrent  S/p NSM, TE reconstruction (serratus fascia/subpectoral) S/p implant exchange right, left augmentation with saline S/p removal bilateral breast implants, MRSA culture from left breast cavity S/p re placement bilateral saline implants S/p implant exchange right for exposure S/p removal right breast implant  Plan:      Reviewed soft tissue flaps quite thin and failed implant placement x 2. I recommend latissimus flap if she desires additional reconstruction attempts and placement expander again. Reviewed back donor site, possible diminished strength arm, two drains at least, inpatient hospital stay. Reviewed risk seroma, infection, failure flap, extrusion, weakness arm. Last visit stated her worker from mental health suggested SNF or Rehab transfer post any future reconstruction. Surgery at least 3 months after explant to allow soft tissue to soften.   Mentor Siltex Moderate Profile Saline implant. LEFT 175 ml, fill volume 175 ml.   Irene Limbo, MD [facility] [phone]

## 2016-02-23 NOTE — Progress Notes (Signed)
Pt made aware to stop  taking Aspirin, vitamins, fish oil and herbal medications. Do not take any NSAIDs ie: Ibuprofen, Advil, Naproxen,  BC and Goody Powder or any medication containing Aspirin. Pt verbalized understanding of all pre-o[p instructions.

## 2016-02-25 ENCOUNTER — Inpatient Hospital Stay (HOSPITAL_COMMUNITY)
Admission: EM | Admit: 2016-02-25 | Discharge: 2016-02-28 | DRG: 190 | Disposition: A | Discharge status: Home / Self Care | Payer: Medicaid Other | Attending: Internal Medicine | Admitting: Internal Medicine

## 2016-02-25 ENCOUNTER — Ambulatory Visit (INDEPENDENT_AMBULATORY_CARE_PROVIDER_SITE_OTHER)
Admission: EM | Admit: 2016-02-25 | Discharge: 2016-02-25 | Disposition: A | Discharge status: Nursing Home (Includes ICF) | Payer: Medicaid Other | Source: Home / Self Care | Attending: Family Medicine | Admitting: Family Medicine

## 2016-02-25 ENCOUNTER — Encounter (HOSPITAL_COMMUNITY): Payer: Self-pay | Admitting: Emergency Medicine

## 2016-02-25 ENCOUNTER — Emergency Department (HOSPITAL_COMMUNITY): Payer: Medicaid Other

## 2016-02-25 DIAGNOSIS — F319 Bipolar disorder, unspecified: Secondary | ICD-10-CM | POA: Diagnosis present

## 2016-02-25 DIAGNOSIS — J9620 Acute and chronic respiratory failure, unspecified whether with hypoxia or hypercapnia: Secondary | ICD-10-CM

## 2016-02-25 DIAGNOSIS — R7989 Other specified abnormal findings of blood chemistry: Secondary | ICD-10-CM

## 2016-02-25 DIAGNOSIS — R739 Hyperglycemia, unspecified: Secondary | ICD-10-CM | POA: Diagnosis present

## 2016-02-25 DIAGNOSIS — T368X5A Adverse effect of other systemic antibiotics, initial encounter: Secondary | ICD-10-CM | POA: Diagnosis not present

## 2016-02-25 DIAGNOSIS — Z79899 Other long term (current) drug therapy: Secondary | ICD-10-CM

## 2016-02-25 DIAGNOSIS — Z23 Encounter for immunization: Secondary | ICD-10-CM

## 2016-02-25 DIAGNOSIS — Z9981 Dependence on supplemental oxygen: Secondary | ICD-10-CM

## 2016-02-25 DIAGNOSIS — F4310 Post-traumatic stress disorder, unspecified: Secondary | ICD-10-CM | POA: Diagnosis present

## 2016-02-25 DIAGNOSIS — R21 Rash and other nonspecific skin eruption: Secondary | ICD-10-CM | POA: Diagnosis not present

## 2016-02-25 DIAGNOSIS — Z681 Body mass index (BMI) 19 or less, adult: Secondary | ICD-10-CM

## 2016-02-25 DIAGNOSIS — J441 Chronic obstructive pulmonary disease with (acute) exacerbation: Secondary | ICD-10-CM | POA: Diagnosis present

## 2016-02-25 DIAGNOSIS — J9621 Acute and chronic respiratory failure with hypoxia: Secondary | ICD-10-CM | POA: Diagnosis present

## 2016-02-25 DIAGNOSIS — Z79891 Long term (current) use of opiate analgesic: Secondary | ICD-10-CM

## 2016-02-25 DIAGNOSIS — F209 Schizophrenia, unspecified: Secondary | ICD-10-CM | POA: Diagnosis present

## 2016-02-25 DIAGNOSIS — I248 Other forms of acute ischemic heart disease: Secondary | ICD-10-CM | POA: Diagnosis present

## 2016-02-25 DIAGNOSIS — R778 Other specified abnormalities of plasma proteins: Secondary | ICD-10-CM | POA: Insufficient documentation

## 2016-02-25 DIAGNOSIS — F419 Anxiety disorder, unspecified: Secondary | ICD-10-CM | POA: Diagnosis present

## 2016-02-25 DIAGNOSIS — Z87891 Personal history of nicotine dependence: Secondary | ICD-10-CM

## 2016-02-25 DIAGNOSIS — J45909 Unspecified asthma, uncomplicated: Secondary | ICD-10-CM | POA: Diagnosis present

## 2016-02-25 DIAGNOSIS — Z9011 Acquired absence of right breast and nipple: Secondary | ICD-10-CM

## 2016-02-25 DIAGNOSIS — E039 Hypothyroidism, unspecified: Secondary | ICD-10-CM | POA: Diagnosis present

## 2016-02-25 DIAGNOSIS — G40909 Epilepsy, unspecified, not intractable, without status epilepticus: Secondary | ICD-10-CM | POA: Diagnosis present

## 2016-02-25 DIAGNOSIS — E43 Unspecified severe protein-calorie malnutrition: Secondary | ICD-10-CM | POA: Insufficient documentation

## 2016-02-25 DIAGNOSIS — Z853 Personal history of malignant neoplasm of breast: Secondary | ICD-10-CM | POA: Diagnosis not present

## 2016-02-25 DIAGNOSIS — R0603 Acute respiratory distress: Secondary | ICD-10-CM | POA: Insufficient documentation

## 2016-02-25 DIAGNOSIS — Z79811 Long term (current) use of aromatase inhibitors: Secondary | ICD-10-CM

## 2016-02-25 LAB — BASIC METABOLIC PANEL
ANION GAP: 8 (ref 5–15)
BUN: 13 mg/dL (ref 6–20)
CALCIUM: 9.3 mg/dL (ref 8.9–10.3)
CHLORIDE: 102 mmol/L (ref 101–111)
CO2: 26 mmol/L (ref 22–32)
CREATININE: 0.7 mg/dL (ref 0.44–1.00)
GFR calc non Af Amer: >60 mL/min (ref >60)
Glucose, Bld: 173 mg/dL — ABNORMAL HIGH (ref 65–99)
Potassium: 4.9 mmol/L (ref 3.5–5.1)
SODIUM: 136 mmol/L (ref 135–145)

## 2016-02-25 LAB — CBC
HCT: 43.3 % (ref 36.0–46.0)
Hemoglobin: 13.9 g/dL (ref 12.0–15.0)
MCH: 27.1 pg (ref 26.0–34.0)
MCHC: 32.1 g/dL (ref 30.0–36.0)
MCV: 84.4 fL (ref 78.0–100.0)
Platelets: 260 10*3/uL (ref 150–400)
RBC: 5.13 MIL/uL — ABNORMAL HIGH (ref 3.87–5.11)
RDW: 14.9 % (ref 11.5–15.5)
WBC: 12.8 10*3/uL — ABNORMAL HIGH (ref 4.0–10.5)

## 2016-02-25 LAB — TROPONIN I
TROPONIN I: 0.07 ng/mL — AB (ref <0.031)
Troponin I: <0.03 ng/mL (ref <0.031)

## 2016-02-25 MED ORDER — MAGNESIUM SULFATE 2 GM/50ML IV SOLN
2.0000 g | INTRAVENOUS | Status: AC
  Administered 2016-02-25: 2 g via INTRAVENOUS
  Filled 2016-02-25: qty 50

## 2016-02-25 MED ORDER — ALBUTEROL SULFATE (2.5 MG/3ML) 0.083% IN NEBU
2.5000 mg | INHALATION_SOLUTION | RESPIRATORY_TRACT | Status: DC | PRN
Start: 2016-02-25 — End: 2016-02-25

## 2016-02-25 MED ORDER — LEVOFLOXACIN IN D5W 500 MG/100ML IV SOLN
500.0000 mg | Freq: Once | INTRAVENOUS | Status: AC
  Administered 2016-02-25: 500 mg via INTRAVENOUS
  Filled 2016-02-25: qty 100

## 2016-02-25 MED ORDER — ACETAMINOPHEN 650 MG RE SUPP: 650.0000 mg | Freq: Four times a day (QID) | RECTAL | Status: DC | PRN

## 2016-02-25 MED ORDER — IPRATROPIUM-ALBUTEROL 0.5-2.5 (3) MG/3ML IN SOLN
3.0000 mL | RESPIRATORY_TRACT | Status: DC
  Administered 2016-02-25: 3 mL via RESPIRATORY_TRACT
  Filled 2016-02-25: qty 3

## 2016-02-25 MED ORDER — ACETAMINOPHEN 325 MG PO TABS: 650.0000 mg | ORAL_TABLET | Freq: Four times a day (QID) | ORAL | Status: DC | PRN

## 2016-02-25 MED ORDER — IPRATROPIUM-ALBUTEROL 0.5-2.5 (3) MG/3ML IN SOLN
3.0000 mL | Freq: Four times a day (QID) | RESPIRATORY_TRACT | Status: DC
  Administered 2016-02-26 – 2016-02-27 (×5): 3 mL via RESPIRATORY_TRACT
  Filled 2016-02-25 (×5): qty 3

## 2016-02-25 MED ORDER — CETYLPYRIDINIUM CHLORIDE 0.05 % MT LIQD
7.0000 mL | Freq: Two times a day (BID) | OROMUCOSAL | Status: DC
  Administered 2016-02-27: 7 mL via OROMUCOSAL

## 2016-02-25 MED ORDER — OXYCODONE HCL 5 MG PO TABS
5.0000 mg | ORAL_TABLET | ORAL | Status: DC | PRN
  Administered 2016-02-26 – 2016-02-28 (×7): 5 mg via ORAL
  Filled 2016-02-25 (×7): qty 1

## 2016-02-25 MED ORDER — IPRATROPIUM-ALBUTEROL 0.5-2.5 (3) MG/3ML IN SOLN
3.0000 mL | Freq: Once | RESPIRATORY_TRACT | Status: AC
  Administered 2016-02-25: 3 mL via RESPIRATORY_TRACT

## 2016-02-25 MED ORDER — ALBUTEROL SULFATE (2.5 MG/3ML) 0.083% IN NEBU
2.5000 mg | INHALATION_SOLUTION | RESPIRATORY_TRACT | Status: DC | PRN
  Administered 2016-02-28: 2.5 mg via RESPIRATORY_TRACT
  Filled 2016-02-25: qty 3

## 2016-02-25 MED ORDER — LEVOTHYROXINE SODIUM 75 MCG PO TABS
75.0000 ug | ORAL_TABLET | Freq: Every day | ORAL | Status: DC
  Administered 2016-02-26 – 2016-02-28 (×2): 75 ug via ORAL
  Filled 2016-02-25 (×3): qty 1

## 2016-02-25 MED ORDER — LEVOFLOXACIN IN D5W 750 MG/150ML IV SOLN
750.0000 mg | INTRAVENOUS | Status: DC
  Administered 2016-02-26: 750 mg via INTRAVENOUS
  Filled 2016-02-25 (×2): qty 150

## 2016-02-25 MED ORDER — ONDANSETRON HCL 4 MG/2ML IJ SOLN: 4.0000 mg | Freq: Three times a day (TID) | INTRAMUSCULAR | Status: DC | PRN

## 2016-02-25 MED ORDER — METHYLPREDNISOLONE SODIUM SUCC 125 MG IJ SOLR
80.0000 mg | Freq: Once | INTRAMUSCULAR | Status: AC
  Administered 2016-02-25: 80 mg via INTRAMUSCULAR

## 2016-02-25 MED ORDER — SODIUM CHLORIDE 0.9 % IV SOLN
INTRAVENOUS | Status: DC
  Administered 2016-02-25: 23:00:00 via INTRAVENOUS

## 2016-02-25 MED ORDER — METHYLPREDNISOLONE SODIUM SUCC 125 MG IJ SOLR
60.0000 mg | Freq: Four times a day (QID) | INTRAMUSCULAR | Status: DC
  Administered 2016-02-25 – 2016-02-26 (×3): 60 mg via INTRAVENOUS
  Filled 2016-02-25 (×3): qty 2

## 2016-02-25 MED ORDER — ALBUTEROL (5 MG/ML) CONTINUOUS INHALATION SOLN: 10.0000 mg/h | INHALATION_SOLUTION | RESPIRATORY_TRACT | Status: AC

## 2016-02-25 MED ORDER — ANASTROZOLE 1 MG PO TABS
1.0000 mg | ORAL_TABLET | Freq: Every day | ORAL | Status: DC
  Administered 2016-02-26 – 2016-02-28 (×3): 1 mg via ORAL
  Filled 2016-02-25 (×3): qty 1

## 2016-02-25 MED ORDER — DIVALPROEX SODIUM 500 MG PO DR TAB
500.0000 mg | DELAYED_RELEASE_TABLET | Freq: Two times a day (BID) | ORAL | Status: DC
  Administered 2016-02-25 – 2016-02-27 (×4): 500 mg via ORAL
  Filled 2016-02-25 (×6): qty 1

## 2016-02-25 MED ORDER — METHYLPREDNISOLONE SODIUM SUCC 125 MG IJ SOLR: 125.0000 mg | Freq: Once | INTRAMUSCULAR | Status: DC

## 2016-02-25 MED ORDER — CEFAZOLIN SODIUM-DEXTROSE 2-4 GM/100ML-% IV SOLN: 2.0000 g | INTRAVENOUS | Status: DC

## 2016-02-25 MED ORDER — CHLORHEXIDINE GLUCONATE 4 % EX LIQD: 1.0000 "application " | Freq: Once | CUTANEOUS | Status: DC

## 2016-02-25 MED ORDER — ENSURE ENLIVE PO LIQD
237.0000 mL | Freq: Two times a day (BID) | ORAL | Status: DC
  Administered 2016-02-26 – 2016-02-28 (×4): 237 mL via ORAL

## 2016-02-25 MED ORDER — MOMETASONE FURO-FORMOTEROL FUM 200-5 MCG/ACT IN AERO: 2.0000 | INHALATION_SPRAY | Freq: Two times a day (BID) | RESPIRATORY_TRACT | Status: DC

## 2016-02-25 MED ORDER — ALBUTEROL (5 MG/ML) CONTINUOUS INHALATION SOLN
10.0000 mg/h | INHALATION_SOLUTION | RESPIRATORY_TRACT | Status: AC
  Administered 2016-02-25: 10 mg/h via RESPIRATORY_TRACT
  Filled 2016-02-25: qty 20

## 2016-02-25 MED ORDER — ENOXAPARIN SODIUM 40 MG/0.4ML ~~LOC~~ SOLN
40.0000 mg | SUBCUTANEOUS | Status: DC
  Administered 2016-02-25 – 2016-02-27 (×3): 40 mg via SUBCUTANEOUS
  Filled 2016-02-25 (×3): qty 0.4

## 2016-02-25 MED ORDER — IPRATROPIUM-ALBUTEROL 0.5-2.5 (3) MG/3ML IN SOLN
RESPIRATORY_TRACT | Status: AC
  Filled 2016-02-25: qty 3

## 2016-02-25 MED ORDER — PROMETHAZINE HCL 25 MG PO TABS: 12.5000 mg | ORAL_TABLET | Freq: Four times a day (QID) | ORAL | Status: DC | PRN

## 2016-02-25 MED ORDER — ALBUTEROL SULFATE (2.5 MG/3ML) 0.083% IN NEBU: 2.5000 mg | INHALATION_SOLUTION | RESPIRATORY_TRACT | Status: DC

## 2016-02-25 MED ORDER — METHYLPREDNISOLONE SODIUM SUCC 125 MG IJ SOLR
INTRAMUSCULAR | Status: AC
  Filled 2016-02-25: qty 2

## 2016-02-25 NOTE — ED Notes (Signed)
PT reports a cough for 1 week. PT reports she is coughing up thick green mucus. PT reports hot flashes and cold chills and body aches.
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
## 2016-02-25 NOTE — ED Notes (Signed)
Pt food tray just delivered to pt.

## 2016-02-25 NOTE — Progress Notes (Signed)
Called ER RN for report. Room ready.  

## 2016-02-25 NOTE — ED Notes (Signed)
Attempted report 
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
## 2016-02-25 NOTE — ED Notes (Signed)
Pt returned from xray

## 2016-02-25 NOTE — ED Notes (Signed)
Pt transported to xray 

## 2016-02-25 NOTE — ED Notes (Signed)
C/o sob and productive cough with green sputum x 3 days.
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
## 2016-02-25 NOTE — Discharge Instructions (Signed)
ED transfer

## 2016-02-26 ENCOUNTER — Inpatient Hospital Stay (HOSPITAL_COMMUNITY): Admission: RE | Admit: 2016-02-26 | Payer: Medicaid Other | Source: Ambulatory Visit | Admitting: Plastic Surgery

## 2016-02-26 ENCOUNTER — Encounter (HOSPITAL_COMMUNITY): Admission: RE | Payer: Self-pay | Source: Ambulatory Visit

## 2016-02-26 DIAGNOSIS — J9621 Acute and chronic respiratory failure with hypoxia: Secondary | ICD-10-CM | POA: Diagnosis present

## 2016-02-26 DIAGNOSIS — Z87891 Personal history of nicotine dependence: Secondary | ICD-10-CM | POA: Diagnosis not present

## 2016-02-26 DIAGNOSIS — E039 Hypothyroidism, unspecified: Secondary | ICD-10-CM | POA: Diagnosis present

## 2016-02-26 DIAGNOSIS — R21 Rash and other nonspecific skin eruption: Secondary | ICD-10-CM | POA: Diagnosis not present

## 2016-02-26 DIAGNOSIS — Z79899 Other long term (current) drug therapy: Secondary | ICD-10-CM | POA: Diagnosis not present

## 2016-02-26 DIAGNOSIS — Z23 Encounter for immunization: Secondary | ICD-10-CM | POA: Diagnosis not present

## 2016-02-26 DIAGNOSIS — Z9981 Dependence on supplemental oxygen: Secondary | ICD-10-CM | POA: Diagnosis not present

## 2016-02-26 DIAGNOSIS — F4310 Post-traumatic stress disorder, unspecified: Secondary | ICD-10-CM | POA: Diagnosis present

## 2016-02-26 DIAGNOSIS — R739 Hyperglycemia, unspecified: Secondary | ICD-10-CM | POA: Diagnosis present

## 2016-02-26 DIAGNOSIS — J441 Chronic obstructive pulmonary disease with (acute) exacerbation: Secondary | ICD-10-CM | POA: Diagnosis present

## 2016-02-26 DIAGNOSIS — F209 Schizophrenia, unspecified: Secondary | ICD-10-CM | POA: Diagnosis present

## 2016-02-26 DIAGNOSIS — Z9011 Acquired absence of right breast and nipple: Secondary | ICD-10-CM | POA: Diagnosis not present

## 2016-02-26 DIAGNOSIS — Z681 Body mass index (BMI) 19 or less, adult: Secondary | ICD-10-CM | POA: Diagnosis not present

## 2016-02-26 DIAGNOSIS — R0600 Dyspnea, unspecified: Secondary | ICD-10-CM | POA: Diagnosis present

## 2016-02-26 DIAGNOSIS — Z853 Personal history of malignant neoplasm of breast: Secondary | ICD-10-CM | POA: Diagnosis not present

## 2016-02-26 DIAGNOSIS — Z79811 Long term (current) use of aromatase inhibitors: Secondary | ICD-10-CM | POA: Diagnosis not present

## 2016-02-26 DIAGNOSIS — F319 Bipolar disorder, unspecified: Secondary | ICD-10-CM | POA: Diagnosis present

## 2016-02-26 DIAGNOSIS — G40909 Epilepsy, unspecified, not intractable, without status epilepticus: Secondary | ICD-10-CM | POA: Diagnosis present

## 2016-02-26 DIAGNOSIS — F419 Anxiety disorder, unspecified: Secondary | ICD-10-CM | POA: Diagnosis present

## 2016-02-26 DIAGNOSIS — E43 Unspecified severe protein-calorie malnutrition: Secondary | ICD-10-CM | POA: Diagnosis present

## 2016-02-26 DIAGNOSIS — J45909 Unspecified asthma, uncomplicated: Secondary | ICD-10-CM | POA: Diagnosis present

## 2016-02-26 DIAGNOSIS — T368X5A Adverse effect of other systemic antibiotics, initial encounter: Secondary | ICD-10-CM | POA: Diagnosis not present

## 2016-02-26 DIAGNOSIS — I248 Other forms of acute ischemic heart disease: Secondary | ICD-10-CM | POA: Diagnosis present

## 2016-02-26 DIAGNOSIS — Z79891 Long term (current) use of opiate analgesic: Secondary | ICD-10-CM | POA: Diagnosis not present

## 2016-02-26 LAB — BASIC METABOLIC PANEL
Anion gap: 7 (ref 5–15)
BUN: 14 mg/dL (ref 6–20)
CHLORIDE: 105 mmol/L (ref 101–111)
CO2: 28 mmol/L (ref 22–32)
CREATININE: 0.76 mg/dL (ref 0.44–1.00)
Calcium: 9.5 mg/dL (ref 8.9–10.3)
GFR calc non Af Amer: >60 mL/min (ref >60)
GLUCOSE: 203 mg/dL — AB (ref 65–99)
Potassium: 4.1 mmol/L (ref 3.5–5.1)
Sodium: 140 mmol/L (ref 135–145)

## 2016-02-26 LAB — TROPONIN I

## 2016-02-26 LAB — CBC
HEMATOCRIT: 40.4 % (ref 36.0–46.0)
HEMOGLOBIN: 12.9 g/dL (ref 12.0–15.0)
MCH: 26.7 pg (ref 26.0–34.0)
MCHC: 31.9 g/dL (ref 30.0–36.0)
MCV: 83.6 fL (ref 78.0–100.0)
Platelets: 263 10*3/uL (ref 150–400)
RBC: 4.83 MIL/uL (ref 3.87–5.11)
RDW: 15 % (ref 11.5–15.5)
WBC: 11.4 10*3/uL — ABNORMAL HIGH (ref 4.0–10.5)

## 2016-02-26 LAB — MRSA PCR SCREENING: MRSA by PCR: NEGATIVE

## 2016-02-26 LAB — GLUCOSE, CAPILLARY
GLUCOSE-CAPILLARY: 156 mg/dL — AB (ref 65–99)
Glucose-Capillary: 121 mg/dL — ABNORMAL HIGH (ref 65–99)

## 2016-02-26 SURGERY — BREAST RECONSTRUCTION WITH PLACEMENT OF TISSUE EXPANDER AND FLEX HD (ACELLULAR HYDRATED DERMIS)
Anesthesia: General | Site: Breast | Laterality: Right

## 2016-02-26 MED ORDER — DEXTROSE 5 % IV SOLN: 500.0000 mg | INTRAVENOUS | Status: DC

## 2016-02-26 MED ORDER — METHYLPREDNISOLONE SODIUM SUCC 40 MG IJ SOLR
40.0000 mg | Freq: Three times a day (TID) | INTRAMUSCULAR | Status: DC
  Administered 2016-02-26 – 2016-02-28 (×5): 40 mg via INTRAVENOUS
  Filled 2016-02-26 (×5): qty 1

## 2016-02-26 MED ORDER — INSULIN ASPART 100 UNIT/ML ~~LOC~~ SOLN
0.0000 [IU] | Freq: Three times a day (TID) | SUBCUTANEOUS | Status: DC
  Administered 2016-02-26: 2 [IU] via SUBCUTANEOUS
  Administered 2016-02-27: 1 [IU] via SUBCUTANEOUS
  Administered 2016-02-28: 7 [IU] via SUBCUTANEOUS

## 2016-02-26 MED ORDER — DIPHENHYDRAMINE HCL 50 MG/ML IJ SOLN
25.0000 mg | Freq: Once | INTRAMUSCULAR | Status: AC
  Administered 2016-02-26: 25 mg via INTRAVENOUS
  Filled 2016-02-26: qty 1

## 2016-02-26 MED ORDER — DEXTROSE 5 % IV SOLN
1.0000 g | INTRAVENOUS | Status: DC
  Administered 2016-02-26: 1 g via INTRAVENOUS
  Filled 2016-02-26 (×2): qty 10

## 2016-02-26 NOTE — Evaluation (Signed)
Physical Therapy Evaluation Patient Details Name: Courtney Terry MRN: [id number] DOB: 1960-11-23 Today's Date: 02/26/2016   History of Present Illness  Courtney Terry is a 56 y.o. woman with a history of COPD (she is on 2L oxygen [place] at home), former tobacco use, breast cancer, and seizure disorder (she is on Depakote) who presents to the ED for evaluation of four days of progressive shortness of breath with increased wheezing. She has had a cough productive of thick, yellow-green sputum.  Clinical Impression  Pt admitted with above diagnosis. Pt currently with functional limitations due to the deficits listed below (see PT Problem List).  Pt will benefit from skilled PT to increase their independence and safety with mobility to allow discharge to the venue listed below.    Pt on Room Air when I arrived in the room; walked in the room on Room Air, and noted O2 sat was 94%     Follow Up Recommendations No PT follow up;Supervision - Intermittent [facility] for chronic disease management)    Equipment Recommendations  None recommended by PT    Recommendations for Other Services OT consult (for energy conservation )     Precautions / Restrictions Precautions Precautions: Other (comment) Precaution Comments: Educated pt to self-monitor for activity tolerance      Mobility  Bed Mobility Overal bed mobility: Independent                Transfers Overall transfer level: Needs assistance   Transfers: Sit to/from Stand Sit to Stand: Supervision         General transfer comment: Cues to self-monitor for activity tolerance  Ambulation/Gait Ambulation/Gait assistance: Min guard;Supervision Ambulation Distance (Feet): 20 Feet (to/from bathroom) Assistive device: None Gait Pattern/deviations: Step-through pattern     General Gait Details: Cues to self-monitor for activity tolerance   Stairs            Wheelchair Mobility    Modified Rankin (Stroke Patients Only)        Balance Overall balance assessment: No apparent balance deficits (not formally assessed)                                           Pertinent Vitals/Pain Pain Assessment: No/denies pain    Home Living Family/patient expects to be discharged to:: Private residence Living Arrangements: Non-relatives/Friends Available Help at Discharge: Personal care attendant Type of Home: Apartment Home Access: Stairs to enter Entrance Stairs-Rails: None Entrance Stairs-Number of Steps: 3 Home Layout: One level Home Equipment:  (Oxygen)      Prior Function Level of Independence: Independent               Hand Dominance        Extremity/Trunk Assessment   Upper Extremity Assessment: Overall WFL for tasks assessed           Lower Extremity Assessment: Overall WFL for tasks assessed         Communication   Communication: No difficulties  Cognition Arousal/Alertness: Awake/alert Behavior During Therapy: WFL for tasks assessed/performed;Impulsive Overall Cognitive Status: Within Functional Limits for tasks assessed                      General Comments General comments (skin integrity, edema, etc.): On room air upon arrival; O2 sats reading 93-95%    Exercises        Assessment/Plan  PT Assessment Patient needs continued PT services  PT Diagnosis Other (comment) (Decreased Functional Capacity)   PT Problem List Decreased activity tolerance;Cardiopulmonary status limiting activity  PT Treatment Interventions DME instruction;Gait training;Stair training;Functional mobility training   PT Goals (Current goals can be found in the Care Plan section) Acute Rehab PT Goals Patient Stated Goal: did not state PT Goal Formulation: With patient Time For Goal Achievement: 03/04/16 Potential to Achieve Goals: Good    Frequency Min 3X/week (likley 1-2 more sessions)   Barriers to discharge        Co-evaluation               End of  Session   Activity Tolerance: Patient tolerated treatment well Patient left: in bed;with call bell/phone within reach Nurse Communication: Mobility status    Functional Assessment Tool Used: Clinical Judgement Functional Limitation: Mobility: Walking and moving around Mobility: Walking and Moving Around Current Status [id number]): At least 1 percent but less than 20 percent impaired, limited or restricted Mobility: Walking and Moving Around Goal Status [phone]): 0 percent impaired, limited or restricted    Time: 1329-1346 PT Time Calculation (min) (ACUTE ONLY): 17 min   Charges:   PT Evaluation $PT Eval Low Complexity: 1 Procedure     PT G Codes:   PT G-Codes **NOT FOR INPATIENT CLASS** Functional Assessment Tool Used: Clinical Judgement Functional Limitation: Mobility: Walking and moving around Mobility: Walking and Moving Around Current Status [id number]): At least 1 percent but less than 20 percent impaired, limited or restricted Mobility: Walking and Moving Around Goal Status [phone]): 0 percent impaired, limited or restricted    [place] 02/26/2016, 2:59 PM  Roney Marion, [place] Pager [phone] Office [phone]

## 2016-02-26 NOTE — Progress Notes (Signed)
Occupational Therapy Evaluation Patient Details Name: Courtney Terry MRN: [id number] DOB: November 20, 1960 Today's Date: 02/26/2016    History of Present Illness Courtney Terry is a 55 y.o. woman with a history of COPD (she is on 2L oxygen [place] at home), former tobacco use, breast cancer, and seizure disorder (she is on Depakote) who presents to the ED for evaluation of four days of progressive shortness of breath with increased wheezing. She has had a cough productive of thick, yellow-green sputum.Bipolar.   Clinical Impression   PTA, pt lived at home and has PCA 4 hrs/day 7 days/wk. On entry to room, pt on RA. After ADL tasks, pt with 2/4 dypsnea but O2 Sats 99. Will follow up tomorrow to educate on energy conservation techniques and assess use of AE for LB ADL.     Follow Up Recommendations  No OT follow up;Supervision - Intermittent    Equipment Recommendations  Tub/shower seat    Recommendations for Other Services       Precautions / Restrictions Precautions Precautions: Other (comment) Precaution Comments: Educated pt to self-monitor for activity tolerance      Mobility Bed Mobility Overal bed mobility: Independent                Transfers Overall transfer level: Needs assistance   Transfers: Sit to/from Stand Sit to Stand: Min guard         General transfer comment: Pt trying to pull up on therapist    Balance Overall balance assessment: Needs assistance   Sitting balance-Leahy Scale: Good       Standing balance-Leahy Scale: Fair                              ADL Overall ADL's : Needs assistance/impaired     Grooming: Set up   Upper Body Bathing: Set up;Sitting   Lower Body Bathing: Minimal assistance;Sit to/from stand   Upper Body Dressing : Set up;Sitting   Lower Body Dressing: Minimal assistance;Sit to/from stand   Toilet Transfer: Ambulation;Min guard   Toileting- Water quality scientist and Hygiene: Supervision/safety        Functional mobility during ADLs: Min guard (initially unsteady) General ADL Comments: tates she has difficulty reaching her feet. Becomes SOB with reaching. O2 Sats 99-100 RA     Vision     Perception     Praxis      Pertinent Vitals/Pain Pain Assessment: Faces Faces Pain Scale: Hurts a little bit Pain Location: back/lungs Pain Descriptors / Indicators: Discomfort Pain Intervention(s): Limited activity within patient's tolerance     Hand Dominance Right   Extremity/Trunk Assessment Upper Extremity Assessment Upper Extremity Assessment: Generalized weakness   Lower Extremity Assessment Lower Extremity Assessment: Defer to PT evaluation   Cervical / Trunk Assessment Cervical / Trunk Assessment: Normal   Communication Communication Communication: No difficulties   Cognition - e Arousal/Alertness: Awake/alert Behavior During Therapy: WFL for tasks assessed/performed;Impulsive Overall Cognitive Status: No family/caregiver present to determine baseline cognitive functioning (hx of mental illness - schitzophrenia, depression/anxiety/biplor/ hx of crack/cocaine use)                     General Comments       Exercises       Shoulder Instructions      Home Living Family/patient expects to be discharged to:: Private residence Living Arrangements: Non-relatives/Friends Available Help at Discharge: Personal care attendant (4hrs/day; 7 days/wk) Type of Home: [place]  Access: Stairs to enter [employer] of Steps: 3 Entrance Stairs-Rails: None Home Layout: One level     Bathroom Shower/Tub: Tub/shower unit Shower/tub characteristics: Door Biochemist, clinical: Standard Bathroom Accessibility: Yes How Accessible: Accessible via walker Home Equipment: None (Oxygen)   Additional Comments: Has O2 equipment at home      Prior Functioning/Environment Level of Independence: Needs assistance  Gait / Transfers Assistance Needed:  independent ADL's / Homemaking Assistance Needed: needs assistance with IADL and PCA assists with dressing        OT Diagnosis: Generalized weakness;Acute pain   OT Problem List: Decreased strength;Decreased activity tolerance;Decreased safety awareness;Decreased knowledge of use of DME or AE;Cardiopulmonary status limiting activity;Pain   OT Treatment/Interventions: Self-care/ADL training;DME and/or AE instruction;Energy conservation;Therapeutic activities;Patient/family education    OT Goals(Current goals can be found in the care plan section) Acute Rehab OT Goals Patient Stated Goal: did not state OT Goal Formulation: With patient Time For Goal Achievement: 03/04/16 Potential to Achieve Goals: Good ADL Goals Additional ADL Goal #1: Pt will verbalize understanding of 3 energy conservaiton techniques for ADL Additional ADL Goal #2: Pt will complete LB ADL with use of AE with S  OT Frequency: Min 2X/week   Barriers to D/C: Decreased caregiver support          Co-evaluation              End of Session Nurse Communication: Mobility status  Activity Tolerance: Patient tolerated treatment well Patient left: in bed;with call bell/phone within reach;with bed alarm set   Time: [id number] OT Time Calculation (min): 19 min Charges:  OT General Charges $OT Visit: 1 Procedure OT Evaluation $OT Eval Moderate Complexity: 1 Procedure G-Codes: OT G-codes **NOT FOR INPATIENT CLASS** Functional Assessment Tool Used: clinical judgement Functional Limitation: Self care Self Care Current Status [id number]): At least 1 percent but less than 20 percent impaired, limited or restricted Self Care Goal Status [id number]): At least 1 percent but less than 20 percent impaired, limited or restricted  Courtney Terry,Courtney Terry 02/26/2016, 5:09 PM   [facility], OTR/L  [phone] 02/26/2016

## 2016-02-26 NOTE — Progress Notes (Signed)
Initial Nutrition Assessment  DOCUMENTATION CODES:   Severe malnutrition in context of chronic illness, Underweight  INTERVENTION:  Continue Ensure Enlive po BID, each supplement provides 350 kcal and 20 grams of protein.  Encourage adequate PO intake.   NUTRITION DIAGNOSIS:   Malnutrition related to chronic illness as evidenced by severe depletion of body fat, severe depletion of muscle mass.  GOAL:   Patient will meet greater than or equal to 90% of their needs  MONITOR:   PO intake, Supplement acceptance, Weight trends, Labs, I & O's  REASON FOR ASSESSMENT:   Consult Assessment of nutrition requirement/status  ASSESSMENT:   55 y.o. woman with a history of COPD (she is on 2L oxygen [place] at home), former tobacco use, breast cancer, and seizure disorder (she is on Depakote) who presents to the ED for evaluation of four days of progressive shortness of breath with increased wheezing.  Pt reports appetite has improved. Meal completion has been 100%. Pt reports she has had a decreased appetite over the past 4 days due to her SOB. Pt reports however still consuming at least 2-3 meals a day. Usual body weight reported to be unknown. No significant weight loss per weight records. Pt currently has Ensure ordered and has been consuming them. She reports regularly consuming Ensure at home. RD to continue with current orders.   Nutrition-Focused physical exam completed. Findings are severe fat depletion, severe muscle depletion, and no edema.   Labs and medications reviewed.   Diet Order:  Diet Heart Room service appropriate?: Yes; Fluid consistency:: Thin  Skin:  Reviewed, no issues  Last BM:  5/20  Height:   Ht Readings from Last 1 Encounters:  02/25/16 5\' 11"  (1.803 m)    Weight:   Wt Readings from Last 1 Encounters:  02/25/16 105 lb (47.628 kg)    Ideal Body Weight:  70.45 kg  BMI:  Body mass index is 14.65 kg/(m^2).  Estimated Nutritional Needs:   Kcal:   1800-2000  Protein:  75-95 grams  Fluid:  1.8 - 2 L/day  EDUCATION NEEDS:   No education needs identified at this time  Corrin Parker, MS, RD, LDN Pager # [phone] After hours/ weekend pager # [phone]

## 2016-02-26 NOTE — Progress Notes (Signed)
After starting levaquin IV, patient began to complain of itchiness in right arm.  Hives noted on right arm where IV was placed.  Levaquin stopped, and IV removed per patient's request.  No complaints of shortness of breath.  Vital signs stable.  In no acute distress.  Notified Tylene Fantasia, NP.  Will continue to monitor.

## 2016-02-26 NOTE — Progress Notes (Signed)
PROGRESS NOTE  Courtney Terry  [id number] DOB: Jun 26, 1961  DOA: 02/25/2016 PCP: Pcp Not In System   Brief Narrative:  55 year old female with history of COPD, chronic hypoxic respiratory failure on oxygen 2 L/m, former tobacco abuse, breast cancer, seizure disorder, bipolar disorder/anxiety/depression/schizophrenia, hypothyroid, presented to ED with 4 days history of productive cough, progressive dyspnea and wheezing. Has been out of multiple COPD medications ([place], albuterol nebulizing solution) she was supposed to go to the OR on 5/22 for revision of reconstructive surgery of her right breast but this has to be postponed. Admitted for COPD exacerbation.   Assessment & Plan:   Principal Problem:   COPD exacerbation ([place]) Active Problems:   History of breast cancer   COPD exacerbation - Treated with oxygen, bronchodilator nebulizations, IV Solu-Medrol and levofloxacin. - Improving. Continue management. - Needs prescriptions for medications at discharge and outpatient follow-up with Dr. Melvyn Novas, pulmonology.  Acute on chronic hypoxic respiratory failure - Secondary to COPD exacerbation. Acute respiratory failure resolved. - Single minimally elevated troponin, likely from demand ischemia. Subsequent 2 troponins negative.  Seizure disorder - Continue Depakote  Hypothyroid - Continue levothyroxine  History of breast cancer - Continue Demadex. Surgery postponed  Bipolar disorder/schizophrenia - May be on Depakote for this and seizures. Seems stable.  Hyperglycemia - Check A1c and place on SSI.   DVT prophylaxis: Lovenox Code Status: Full Family Communication: Discussed with patient and cousin at bedside. Disposition Plan: DC home when medically stable.   Consultants:   None  Procedures:   None  Antimicrobials:   Levofloxacin    Subjective: Dyspnea feels much better but breathing has not yet returned to baseline. No chest pain. Intermittent  cough.  Objective:  Filed Vitals:   02/25/16 2115 02/25/16 2130 02/25/16 2222 02/26/16 0454  BP: 122/81 111/93 121/80 106/76  Pulse: 96 94 96 101  Temp:   97.8 F (36.6 C) 98 F (36.7 C)  TempSrc:   Oral Oral  Resp: 19 18 20 20   Height:      Weight:      SpO2: 94% 97% 100% 100%    Intake/Output Summary (Last 24 hours) at 02/26/16 1559 Last data filed at 02/26/16 1300  Gross per 24 hour  Intake   1375 ml  Output      0 ml  Net   1375 ml   Filed Weights   02/25/16 1658  Weight: 47.628 kg (105 lb)    Examination:  General exam: Pleasant middle-aged female sitting up comfortably in bed. Respiratory system: Distant breath sounds but clear to auscultation without wheezing or rhonchi. No increased work of breathing. Cardiovascular system: S1 & S2 heard, RRR. No JVD, murmurs, rubs, gallops or clicks. No pedal edema. Gastrointestinal system: Abdomen is nondistended, soft and nontender. No organomegaly or masses felt. Normal bowel sounds heard. Central nervous system: Alert and oriented. No focal neurological deficits. Extremities: Symmetric 5 x 5 power. Skin: No rashes, lesions or ulcers Psychiatry: Judgement and insight appear normal. Mood & affect appropriate.     Data Reviewed: I have personally reviewed following labs and imaging studies  CBC:  Recent Labs Lab 02/25/16 1726 02/26/16 0338  WBC 12.8* 11.4*  HGB 13.9 12.9  HCT 43.3 40.4  MCV 84.4 83.6  PLT 260 99991111   Basic Metabolic Panel:  Recent Labs Lab 02/25/16 1726 02/26/16 0338  NA 136 140  K 4.9 4.1  CL 102 105  CO2 26 28  GLUCOSE 173* 203*  BUN 13 14  CREATININE 0.70  0.76  CALCIUM 9.3 9.5   GFR: Estimated Creatinine Clearance: 60.4 mL/min (by C-G formula based on Cr of 0.76). Liver Function Tests: No results for input(s): AST, ALT, ALKPHOS, BILITOT, PROT, ALBUMIN in the last 168 hours. No results for input(s): LIPASE, AMYLASE in the last 168 hours. No results for input(s): AMMONIA in the  last 168 hours. Coagulation Profile: No results for input(s): INR, PROTIME in the last 168 hours. Cardiac Enzymes:  Recent Labs Lab 02/25/16 1727 02/25/16 2233 02/26/16 0338  TROPONINI 0.07* <0.03 <0.03   BNP (last 3 results) No results for input(s): PROBNP in the last 8760 hours. HbA1C: No results for input(s): HGBA1C in the last 72 hours. CBG: No results for input(s): GLUCAP in the last 168 hours. Lipid Profile: No results for input(s): CHOL, HDL, LDLCALC, TRIG, CHOLHDL, LDLDIRECT in the last 72 hours. Thyroid Function Tests: No results for input(s): TSH, T4TOTAL, FREET4, T3FREE, THYROIDAB in the last 72 hours. Anemia Panel: No results for input(s): VITAMINB12, FOLATE, FERRITIN, TIBC, IRON, RETICCTPCT in the last 72 hours.  Sepsis Labs: No results for input(s): PROCALCITON, LATICACIDVEN in the last 168 hours.  Recent Results (from the past 240 hour(s))  MRSA PCR Screening     Status: None   Collection Time: 02/25/16 10:31 PM  Result Value Ref Range Status   MRSA by PCR NEGATIVE NEGATIVE Final    Comment:        The GeneXpert MRSA Assay (FDA approved for NASAL specimens only), is one component of a comprehensive MRSA colonization surveillance program. It is not intended to diagnose MRSA infection nor to guide or monitor treatment for MRSA infections.          Radiology Studies: Dg Chest 2 View  02/25/2016  CLINICAL DATA:  Shortness of breath. Productive cough with green sputum. COPD. Breast cancer. EXAM: CHEST  2 VIEW COMPARISON:  07/01/2014 FINDINGS: Tapering of the peripheral pulmonary vasculature favors emphysema. Right mastectomy with axillary dissection. Cardiac and mediastinal margins appear normal. No bony abnormality observed. IMPRESSION: 1. Emphysema.  Right mastectomy.  No acute findings. Electronically Signed   By: Van Clines M.D.   On: 02/25/2016 20:01        Scheduled Meds: . anastrozole  1 mg Oral Daily  . antiseptic oral rinse  7 mL  Mouth Rinse BID  . divalproex  500 mg Oral BID  . enoxaparin (LOVENOX) injection  40 mg Subcutaneous Q24H  . feeding supplement (ENSURE ENLIVE)  237 mL Oral BID BM  . ipratropium-albuterol  3 mL Nebulization QID  . levofloxacin (LEVAQUIN) IV  750 mg Intravenous Q24H  . levothyroxine  75 mcg Oral QAC breakfast  . methylPREDNISolone (SOLU-MEDROL) injection  125 mg Intravenous Once  . methylPREDNISolone (SOLU-MEDROL) injection  60 mg Intravenous Q6H   Continuous Infusions: . sodium chloride 75 mL/hr at 02/25/16 2236        Time spent: 66 minutes     [facility], MD [facility] Pager [phone]  If 7PM-7AM, please contact night-coverage [email or web address] Password [email or web address] 02/26/2016, 3:59 PM

## 2016-02-26 NOTE — Progress Notes (Signed)
While giving pt medications, pt and visitor at bedside began arguing. Pt told visitor to leave. Visitor stated he wasn't going anywhere. Pt requested I have security come and remove pt. This RN told visitor that he had to leave if patient did not want him in room. Visitor said he understood and began packing up his belongings to leave. Security was called as a precaution, but visitor left before security arrived without incident. Will continue to monitor.

## 2016-02-27 DIAGNOSIS — E43 Unspecified severe protein-calorie malnutrition: Secondary | ICD-10-CM | POA: Insufficient documentation

## 2016-02-27 LAB — GLUCOSE, CAPILLARY
Glucose-Capillary: 114 mg/dL — ABNORMAL HIGH (ref 65–99)
Glucose-Capillary: 107 mg/dL — ABNORMAL HIGH (ref 65–99)
Glucose-Capillary: 141 mg/dL — ABNORMAL HIGH (ref 65–99)

## 2016-02-27 LAB — HEMOGLOBIN A1C
HEMOGLOBIN A1C: 6.2 % — AB (ref 4.8–5.6)
MEAN PLASMA GLUCOSE: 131 mg/dL

## 2016-02-27 MED ORDER — DOXYCYCLINE HYCLATE 100 MG PO TABS
100.0000 mg | ORAL_TABLET | Freq: Two times a day (BID) | ORAL | Status: DC
  Administered 2016-02-27 – 2016-02-28 (×3): 100 mg via ORAL
  Filled 2016-02-27 (×3): qty 1

## 2016-02-27 MED ORDER — PNEUMOCOCCAL VAC POLYVALENT 25 MCG/0.5ML IJ INJ
0.5000 mL | INJECTION | INTRAMUSCULAR | Status: AC
  Administered 2016-02-28: 0.5 mL via INTRAMUSCULAR
  Filled 2016-02-27: qty 0.5

## 2016-02-27 NOTE — Progress Notes (Addendum)
Occupational Therapy Treatment Patient Details Name: Courtney Terry MRN: [id number] DOB: January 17, 1961 Today's Date: 02/27/2016    History of present illness Courtney Terry is a 55 y.o. woman with a history of COPD (she is on 2L oxygen [place] at home), former tobacco use, breast cancer, and seizure disorder (she is on Depakote) who presents to the ED for evaluation of four days of progressive shortness of breath with increased wheezing. She has had a cough productive of thick, yellow-green sputum.   OT comments  Pt progressing. Education provided in session. Energy conservation handout given and AE kit given to pt. Feel pt will continue to benefit from acute OT to reinforce energy conservation techniques and practice with AE.   Follow Up Recommendations  No OT follow up;Supervision - Intermittent    Equipment Recommendations  Tub/shower seat    Recommendations for Other Services      Precautions / Restrictions         Mobility Bed Mobility Overal bed mobility: Independent                Transfers Overall transfer level: Needs assistance   Transfers: Sit to/from Stand Sit to Stand: Supervision              Balance    No LOB In session.                                ADL Overall ADL's : Needs assistance/impaired     Grooming: Wash/dry face;Supervision/safety;Standing   Upper Body Bathing: Supervision/ safety;Standing   Lower Body Bathing: Supervison/ safety;Sitting/lateral leans (standing; pt retrieved washcloth and soap already at sink)   Upper Body Dressing : Minimal assistance;Standing Upper Body Dressing Details (indicate cue type and reason): OT helped with tying and untying gown as well as managing snaps Lower Body Dressing: Sitting/lateral leans;Supervision/safety;Set up;With adaptive equipment;Sit to/from stand Lower Body Dressing Details (indicate cue type and reason): donned/doffed socks and panties/pad; donned socks with sock aid;  doffed socks and donned/doffed panties without AE. Toilet Transfer: Supervision/safety;Ambulation (sit to stand from bed and chair)           Functional mobility during ADLs: Supervision/safety General ADL Comments: Pt retrieved washcloth in session and soap already at sink. Educated on energy conservation techniques.       Vision                     Perception     Praxis      Cognition  Awake/Alert Behavior During Therapy: [facility] for tasks assessed/performed;Impulsive Overall Cognitive Status: Within Functional Limits for tasks assessed (history of mental illness)                       Extremity/Trunk Assessment               Exercises     Shoulder Instructions       General Comments      Pertinent Vitals/ Pain       Pain Assessment: 0-10 Pain Score: 9  Pain Location: back and rib area Pain Descriptors / Indicators: Aching;Throbbing Pain Intervention(s): Monitored during session  Home Living                                          Prior Functioning/Environment  Frequency Min 2X/week     Progress Toward Goals  OT Goals(current goals can now be found in the care plan section)  Progress towards OT goals: Progressing toward goals-updated a goal  Acute Rehab OT Goals Patient Stated Goal: go home OT Goal Formulation: With patient Time For Goal Achievement: 03/04/16 Potential to Achieve Goals: Good ADL Goals Additional ADL Goal #1: Pt will verbalize understanding of 3 energy conservaiton techniques for ADL Additional ADL Goal #2: Pt will complete LB ADL with/without use of AE at Mod I level  Plan Discharge plan remains appropriate    Co-evaluation                 End of Session Equipment Utilized During Treatment: Oxygen   Activity Tolerance Patient tolerated treatment well   Patient Left in bed;with bed alarm set;with call bell/phone within reach   Nurse Communication          Time:  1431-1451 OT Time Calculation (min): 20 min  Charges: OT General Charges $OT Visit: 1 Procedure OT Treatments $Self Care/Home Management : 8-22 mins  [name] OTR/L [phone] 02/27/2016, 3:15 PM

## 2016-02-27 NOTE — Progress Notes (Signed)
PROGRESS NOTE  IVAN BENNE  [id number] DOB: 09-24-1961  DOA: 02/25/2016 PCP: Pcp Not In System   Brief Narrative:  55 year old female with history of COPD, chronic hypoxic respiratory failure on oxygen 2 L/m, former tobacco abuse, breast cancer, seizure disorder, bipolar disorder/anxiety/depression/schizophrenia, hypothyroid, presented to ED with 4 days history of productive cough, progressive dyspnea and wheezing. Has been out of multiple COPD medications ([place], albuterol nebulizing solution) she was supposed to go to the OR on 5/22 for revision of reconstructive surgery of her right breast but this has to be postponed. Admitted for COPD exacerbation. Improving.   Assessment & Plan:   Principal Problem:   COPD exacerbation ([place]) Active Problems:   History of breast cancer   Protein-calorie malnutrition, severe   COPD exacerbation - Treated with oxygen, bronchodilator nebulizations, IV Solu-Medrol and levofloxacin. Reacted to levofloxacin last night and changed to oral doxycycline today. - Slowly improving. Continue management. - Needs prescriptions for medications at discharge and outpatient follow-up with Dr. Melvyn Novas, pulmonology.  Acute on chronic hypoxic respiratory failure - Secondary to COPD exacerbation. Acute respiratory failure resolved. - Single minimally elevated troponin, likely from demand ischemia. Subsequent 2 troponins negative.  Seizure disorder - Continue Depakote  Hypothyroid - Continue levothyroxine  History of breast cancer - Continue Demadex. Surgery postponed  Bipolar disorder/schizophrenia - May be on Depakote for this and seizures. Seems stable.  Hyperglycemia - Check A1c/6.2 and place on SSI.   DVT prophylaxis: Lovenox Code Status: Full Family Communication: Discussed with patient. Disposition Plan: DC home when medically stable, possibly 5/24.   Consultants:   None  Procedures:   None  Antimicrobials:   Levofloxacin     Subjective: Feels better but still with DOE-not at baseline. Overnight events noted-reacted to levofloxacin and received a dose of Rocephin.  Objective:  Filed Vitals:   02/27/16 0835 02/27/16 1255 02/27/16 1330 02/27/16 1617  BP: 136/89  145/77 112/67  Pulse: 101  98 91  Temp: 97.7 F (36.5 C)  97.4 F (36.3 C) 98.7 F (37.1 C)  TempSrc: Oral  Oral Oral  Resp: 18  18 18   Height:      Weight:      SpO2: 98% 95% 94% 95%    Intake/Output Summary (Last 24 hours) at 02/27/16 1623 Last data filed at 02/27/16 1330  Gross per 24 hour  Intake   1250 ml  Output      4 ml  Net   1246 ml   Filed Weights   02/25/16 1658 02/26/16 2027  Weight: 47.628 kg (105 lb) 50.122 kg (110 lb 8 oz)    Examination:  General exam: Pleasant middle-aged female sitting up comfortably in bed. Respiratory system: Distant breath sounds and occasional posterior rhonchi. No increased work of breathing. Cardiovascular system: S1 & S2 heard, RRR. No JVD, murmurs, rubs, gallops or clicks. No pedal edema. Gastrointestinal system: Abdomen is nondistended, soft and nontender. No organomegaly or masses felt. Normal bowel sounds heard. Central nervous system: Alert and oriented. No focal neurological deficits. Extremities: Symmetric 5 x 5 power. Skin: No rashes, lesions or ulcers Psychiatry: Judgement and insight appear normal. Mood & affect appropriate.     Data Reviewed: I have personally reviewed following labs and imaging studies  CBC:  Recent Labs Lab 02/25/16 1726 02/26/16 0338  WBC 12.8* 11.4*  HGB 13.9 12.9  HCT 43.3 40.4  MCV 84.4 83.6  PLT 260 99991111   Basic Metabolic Panel:  Recent Labs Lab 02/25/16 1726 02/26/16 0338  NA  136 140  K 4.9 4.1  CL 102 105  CO2 26 28  GLUCOSE 173* 203*  BUN 13 14  CREATININE 0.70 0.76  CALCIUM 9.3 9.5   GFR: Estimated Creatinine Clearance: 63.6 mL/min (by C-G formula based on Cr of 0.76). Liver Function Tests: No results for input(s): AST,  ALT, ALKPHOS, BILITOT, PROT, ALBUMIN in the last 168 hours. No results for input(s): LIPASE, AMYLASE in the last 168 hours. No results for input(s): AMMONIA in the last 168 hours. Coagulation Profile: No results for input(s): INR, PROTIME in the last 168 hours. Cardiac Enzymes:  Recent Labs Lab 02/25/16 1727 02/25/16 2233 02/26/16 0338  TROPONINI 0.07* <0.03 <0.03   BNP (last 3 results) No results for input(s): PROBNP in the last 8760 hours. HbA1C:  Recent Labs  02/26/16 1615  HGBA1C 6.2*   CBG:  Recent Labs Lab 02/26/16 1823 02/26/16 2109 02/27/16 0806 02/27/16 1205  GLUCAP 156* 121* 114* 107*   Lipid Profile: No results for input(s): CHOL, HDL, LDLCALC, TRIG, CHOLHDL, LDLDIRECT in the last 72 hours. Thyroid Function Tests: No results for input(s): TSH, T4TOTAL, FREET4, T3FREE, THYROIDAB in the last 72 hours. Anemia Panel: No results for input(s): VITAMINB12, FOLATE, FERRITIN, TIBC, IRON, RETICCTPCT in the last 72 hours.  Sepsis Labs: No results for input(s): PROCALCITON, LATICACIDVEN in the last 168 hours.  Recent Results (from the past 240 hour(s))  MRSA PCR Screening     Status: None   Collection Time: 02/25/16 10:31 PM  Result Value Ref Range Status   MRSA by PCR NEGATIVE NEGATIVE Final    Comment:        The GeneXpert MRSA Assay (FDA approved for NASAL specimens only), is one component of a comprehensive MRSA colonization surveillance program. It is not intended to diagnose MRSA infection nor to guide or monitor treatment for MRSA infections.          Radiology Studies: Dg Chest 2 View  02/25/2016  CLINICAL DATA:  Shortness of breath. Productive cough with green sputum. COPD. Breast cancer. EXAM: CHEST  2 VIEW COMPARISON:  07/01/2014 FINDINGS: Tapering of the peripheral pulmonary vasculature favors emphysema. Right mastectomy with axillary dissection. Cardiac and mediastinal margins appear normal. No bony abnormality observed. IMPRESSION: 1.  Emphysema.  Right mastectomy.  No acute findings. Electronically Signed   By: Van Clines M.D.   On: 02/25/2016 20:01        Scheduled Meds: . anastrozole  1 mg Oral Daily  . antiseptic oral rinse  7 mL Mouth Rinse BID  . divalproex  500 mg Oral BID  . doxycycline  100 mg Oral Q12H  . enoxaparin (LOVENOX) injection  40 mg Subcutaneous Q24H  . feeding supplement (ENSURE ENLIVE)  237 mL Oral BID BM  . insulin aspart  0-9 Units Subcutaneous TID WC  . levothyroxine  75 mcg Oral QAC breakfast  . methylPREDNISolone (SOLU-MEDROL) injection  40 mg Intravenous Q8H   Continuous Infusions:     LOS: 1 day    Time spent: 20 minutes     Jamarie J[place], MD [facility] Pager [phone]  If 7PM-7AM, please contact night-coverage [email or web address] Password [facility] 02/27/2016, 4:23 PM

## 2016-02-28 DIAGNOSIS — J9621 Acute and chronic respiratory failure with hypoxia: Secondary | ICD-10-CM

## 2016-02-28 DIAGNOSIS — E43 Unspecified severe protein-calorie malnutrition: Secondary | ICD-10-CM

## 2016-02-28 DIAGNOSIS — J9620 Acute and chronic respiratory failure, unspecified whether with hypoxia or hypercapnia: Secondary | ICD-10-CM

## 2016-02-28 DIAGNOSIS — J441 Chronic obstructive pulmonary disease with (acute) exacerbation: Principal | ICD-10-CM

## 2016-02-28 LAB — GLUCOSE, CAPILLARY
Glucose-Capillary: 154 mg/dL — ABNORMAL HIGH (ref 65–99)
Glucose-Capillary: 306 mg/dL — ABNORMAL HIGH (ref 65–99)

## 2016-02-28 MED ORDER — DOXYCYCLINE HYCLATE 100 MG PO TABS: 100.0000 mg | ORAL_TABLET | Freq: Two times a day (BID) | ORAL | Status: DC

## 2016-02-28 MED ORDER — OXYCODONE HCL 5 MG PO TABS: 5.0000 mg | ORAL_TABLET | ORAL | Status: DC | PRN

## 2016-02-28 MED ORDER — PREDNISONE 20 MG PO TABS: 20.0000 mg | ORAL_TABLET | Freq: Every day | ORAL | Status: DC

## 2016-02-28 NOTE — Discharge Summary (Signed)
Physician Discharge Summary  Courtney Terry [id number] DOB: 1961/04/16 DOA: 02/25/2016  PCP: Pcp Not In System  Admit date: 02/25/2016 Discharge date: 02/28/2016  Admitted From: home Disposition:  home  Recommendations for Outpatient Follow-up:  1. Follow up with PCP in 1-2 weeks 2. Please obtain BMP/CBC in one week   Home Health:yes Equipment/Devices: 2L oxygen  Discharge Condition: stable CODE STATUS:FULL Diet recommendation: carb modified/heart healthy  Brief/Interim Summary 55 year old female with history of COPD, chronic hypoxic respiratory failure on oxygen 2 L/m, former tobacco abuse, breast cancer, seizure disorder, bipolar disorder/anxiety/depression/schizophrenia, hypothyroid, presented to ED with 4 days history of productive cough, progressive dyspnea and wheezing. Has been out of multiple COPD medications ([place], albuterol nebulizing solution) she was supposed to go to the OR on 5/22 for revision of reconstructive surgery of her right breast but this has to be postponed. Admitted for COPD exacerbation. Good clinical response to IV steroids and abx.  Home with prednisone taper  Discharge Diagnoses:   COPD exacerbation - Treated with oxygen, bronchodilator nebulizations, IV Solu-Medrol and levofloxacin. Rash to levofloxacin last night and changed to oral doxycycline today. - Slowly improving. Continue management. - Needs prescriptions for medications at discharge and outpatient follow-up with Dr. Melvyn Novas, pulmonology. -home with prednisone taper 50mg -->40-->30-->20-->10 over 5 days -home with doxy x 2 more days to finish 5 days of tx  Acute on chronic hypoxic respiratory failure - Secondary to COPD exacerbation. Acute respiratory failure resolved. - Single minimally elevated troponin, likely from demand ischemia. Subsequent 2 troponins negative.  Seizure disorder - Continue Depakote  Hypothyroid - Continue levothyroxine  History of breast cancer - Continue  Arimidex. Surgery postponed  Bipolar disorder/schizophrenia - May be on Depakote for this and seizures. Seems stable.  Hyperglycemia - Check A1c/6.2 and place on SSI. - will need PCP follow up to discuss future need to start oral hypoglycemic meds  Severe malnutrition -continue ensure  Discharge Instructions      Discharge Instructions    Diet - low sodium heart healthy    Complete by:  As directed      Increase activity slowly    Complete by:  As directed             Medication List    STOP taking these medications        DULERA 200-5 MCG/ACT Aero  Generic drug:  mometasone-formoterol      TAKE these medications        albuterol (2.5 MG/3ML) 0.083% nebulizer solution  Commonly known as:  PROVENTIL  Take 3 mLs (2.5 mg total) by nebulization every 4 (four) hours as needed for wheezing or shortness of breath.     PROAIR HFA 108 (90 Base) MCG/ACT inhaler  Generic drug:  albuterol  INHALE 2 PUFFS INTO THE LUNGS EVERY 4 HOURS AS NEEDED FOR WHEEZING     anastrozole 1 MG tablet  Commonly known as:  ARIMIDEX  Take 1 tablet (1 mg total) by mouth daily.     divalproex 500 MG DR tablet  Commonly known as:  DEPAKOTE  Take 500 mg by mouth 2 (two) times daily.     doxycycline 100 MG tablet  Commonly known as:  VIBRA-TABS  Take 1 tablet (100 mg total) by mouth every 12 (twelve) hours.     ENSURE PLUS Liqd  Take 1 Can by mouth daily as needed.     levothyroxine 75 MCG tablet  Commonly known as:  SYNTHROID, LEVOTHROID  Take 75 mcg by mouth daily.  oxyCODONE 5 MG immediate release tablet  Commonly known as:  Oxy IR/ROXICODONE  Take 1 tablet (5 mg total) by mouth every 4 (four) hours as needed for moderate pain.     OXYGEN  Inhale 2 L into the lungs continuous.     predniSONE 20 MG tablet  Commonly known as:  DELTASONE  Take 1 tablet (20 mg total) by mouth daily with breakfast. And decrease by 1 tablet daily  Start taking on:  02/29/2016        Allergies    Allergen Reactions  . Levaquin [Levofloxacin] Hives    Consultations:  none   Procedures/Studies: Dg Chest 2 View  02/25/2016  CLINICAL DATA:  Shortness of breath. Productive cough with green sputum. COPD. Breast cancer. EXAM: CHEST  2 VIEW COMPARISON:  07/01/2014 FINDINGS: Tapering of the peripheral pulmonary vasculature favors emphysema. Right mastectomy with axillary dissection. Cardiac and mediastinal margins appear normal. No bony abnormality observed. IMPRESSION: 1. Emphysema.  Right mastectomy.  No acute findings. Electronically Signed   By: Van Clines M.D.   On: 02/25/2016 20:01        Discharge Exam: Filed Vitals:   02/28/16 0501 02/28/16 0754  BP: 117/71 135/89  Pulse: 103 74  Temp: 98.6 F (37 C) 98.3 F (36.8 C)  Resp: 18 18   Filed Vitals:   02/27/16 1617 02/27/16 2137 02/28/16 0501 02/28/16 0754  BP: 112/67 134/73 117/71 135/89  Pulse: 91 94 103 74  Temp: 98.7 F (37.1 C) 98.5 F (36.9 C) 98.6 F (37 C) 98.3 F (36.8 C)  TempSrc: Oral Oral Oral Oral  Resp: 18 16 18 18   Height:      Weight:  48.2 kg (106 lb 4.2 oz)    SpO2: 95% 94% 100% 100%    General: Pt is alert, awake, not in acute distress Cardiovascular: RRR, S1/S2 +, no rubs, no gallops Respiratory: Diminished breath sounds without any wheezing Abdominal: Soft, NT, ND, bowel sounds + Extremities: no edema, no cyanosis   The results of significant diagnostics from this hospitalization (including imaging, microbiology, ancillary and laboratory) are listed below for reference.    Significant Diagnostic Studies: Dg Chest 2 View  02/25/2016  CLINICAL DATA:  Shortness of breath. Productive cough with green sputum. COPD. Breast cancer. EXAM: CHEST  2 VIEW COMPARISON:  07/01/2014 FINDINGS: Tapering of the peripheral pulmonary vasculature favors emphysema. Right mastectomy with axillary dissection. Cardiac and mediastinal margins appear normal. No bony abnormality observed. IMPRESSION: 1.  Emphysema.  Right mastectomy.  No acute findings. Electronically Signed   By: Van Clines M.D.   On: 02/25/2016 20:01     Microbiology: Recent Results (from the past 240 hour(s))  MRSA PCR Screening     Status: None   Collection Time: 02/25/16 10:31 PM  Result Value Ref Range Status   MRSA by PCR NEGATIVE NEGATIVE Final    Comment:        The GeneXpert MRSA Assay (FDA approved for NASAL specimens only), is one component of a comprehensive MRSA colonization surveillance program. It is not intended to diagnose MRSA infection nor to guide or monitor treatment for MRSA infections.      Labs: Basic Metabolic Panel:  Recent Labs Lab 02/25/16 1726 02/26/16 0338  NA 136 140  K 4.9 4.1  CL 102 105  CO2 26 28  GLUCOSE 173* 203*  BUN 13 14  CREATININE 0.70 0.76  CALCIUM 9.3 9.5   Liver Function Tests: No results for input(s): AST, ALT, ALKPHOS,  BILITOT, PROT, ALBUMIN in the last 168 hours. No results for input(s): LIPASE, AMYLASE in the last 168 hours. No results for input(s): AMMONIA in the last 168 hours. CBC:  Recent Labs Lab 02/25/16 1726 02/26/16 0338  WBC 12.8* 11.4*  HGB 13.9 12.9  HCT 43.3 40.4  MCV 84.4 83.6  PLT 260 263   Cardiac Enzymes:  Recent Labs Lab 02/25/16 1727 02/25/16 2233 02/26/16 0338  TROPONINI 0.07* <0.03 <0.03   BNP: Invalid input(s): POCBNP CBG:  Recent Labs Lab 02/27/16 0806 02/27/16 1205 02/27/16 1619 02/27/16 2133 02/28/16 0750  GLUCAP 114* 107* 141* 154* 306*    Time coordinating discharge:  Greater than 30 minutes  Signed:  Rigo Letts, DO [facility] Pager: [phone] 02/28/2016, 11:55 AM

## 2016-02-28 NOTE — Progress Notes (Signed)
Physician Discharge Summary  Courtney Terry [id number] DOB: [birth year] DOA: 02/25/2016  PCP: Pcp Not In System  Admit date: 02/25/2016 Discharge date: 02/28/2016  Admitted From: home Disposition:  home  Recommendations for Outpatient Follow-up:  1. Follow up with PCP in 1-2 weeks 2. Please obtain BMP/CBC in one week   Home Health:yes Equipment/Devices: 2L oxygen  Discharge Condition: stable CODE STATUS:FULL Diet recommendation: carb modified/heart healthy  Brief/Interim Summary 55 year old female with history of COPD, chronic hypoxic respiratory failure on oxygen 2 L/m, former tobacco abuse, breast cancer, seizure disorder, bipolar disorder/anxiety/depression/schizophrenia, hypothyroid, presented to ED with 4 days history of productive cough, progressive dyspnea and wheezing. Has been out of multiple COPD medications ([place], albuterol nebulizing solution) she was supposed to go to the OR on 5/22 for revision of reconstructive surgery of her right breast but this has to be postponed. Admitted for COPD exacerbation. Good clinical response to IV steroids and abx.  Home with prednisone taper  Discharge Diagnoses:   COPD exacerbation - Treated with oxygen, bronchodilator nebulizations, IV Solu-Medrol and levofloxacin. Rash to levofloxacin last night and changed to oral doxycycline today. - Slowly improving. Continue management. - Needs prescriptions for medications at discharge and outpatient follow-up with Dr. Melvyn Novas, pulmonology. -home with prednisone taper 50mg -->40-->30-->20-->10 over 5 days -home with doxy x 2 more days to finish 5 days of tx  Acute on chronic hypoxic respiratory failure - Secondary to COPD exacerbation. Acute respiratory failure resolved. - Single minimally elevated troponin, likely from demand ischemia. Subsequent 2 troponins negative.  Seizure disorder - Continue Depakote  Hypothyroid - Continue levothyroxine  History of breast cancer - Continue  Arimidex. Surgery postponed  Bipolar disorder/schizophrenia - May be on Depakote for this and seizures. Seems stable.  Hyperglycemia - Check A1c/6.2 and place on SSI. - will need PCP follow up to discuss future need to start oral hypoglycemic meds  Severe malnutrition -continue ensure  Discharge Instructions      Discharge Instructions    Diet - low sodium heart healthy    Complete by:  As directed      Increase activity slowly    Complete by:  As directed             Medication List    STOP taking these medications        DULERA 200-5 MCG/ACT Aero  Generic drug:  mometasone-formoterol      TAKE these medications        albuterol (2.5 MG/3ML) 0.083% nebulizer solution  Commonly known as:  PROVENTIL  Take 3 mLs (2.5 mg total) by nebulization every 4 (four) hours as needed for wheezing or shortness of breath.     PROAIR HFA 108 (90 Base) MCG/ACT inhaler  Generic drug:  albuterol  INHALE 2 PUFFS INTO THE LUNGS EVERY 4 HOURS AS NEEDED FOR WHEEZING     anastrozole 1 MG tablet  Commonly known as:  ARIMIDEX  Take 1 tablet (1 mg total) by mouth daily.     divalproex 500 MG DR tablet  Commonly known as:  DEPAKOTE  Take 500 mg by mouth 2 (two) times daily.     doxycycline 100 MG tablet  Commonly known as:  VIBRA-TABS  Take 1 tablet (100 mg total) by mouth every 12 (twelve) hours.     ENSURE PLUS Liqd  Take 1 Can by mouth daily as needed.     levothyroxine 75 MCG tablet  Commonly known as:  SYNTHROID, LEVOTHROID  Take 75 mcg by mouth daily.  oxyCODONE 5 MG immediate release tablet  Commonly known as:  Oxy IR/ROXICODONE  Take 1 tablet (5 mg total) by mouth every 4 (four) hours as needed for moderate pain.     OXYGEN  Inhale 2 L into the lungs continuous.     predniSONE 20 MG tablet  Commonly known as:  DELTASONE  Take 1 tablet (20 mg total) by mouth daily with breakfast. And decrease by 1 tablet daily  Start taking on:  02/29/2016        Allergies    Allergen Reactions  . Levaquin [Levofloxacin] Hives    Consultations:  none   Procedures/Studies: Dg Chest 2 View  02/25/2016  CLINICAL DATA:  Shortness of breath. Productive cough with green sputum. COPD. Breast cancer. EXAM: CHEST  2 VIEW COMPARISON:  07/01/2014 FINDINGS: Tapering of the peripheral pulmonary vasculature favors emphysema. Right mastectomy with axillary dissection. Cardiac and mediastinal margins appear normal. No bony abnormality observed. IMPRESSION: 1. Emphysema.  Right mastectomy.  No acute findings. Electronically Signed   By: Van Clines M.D.   On: 02/25/2016 20:01        Discharge Exam: Filed Vitals:   02/28/16 0501 02/28/16 0754  BP: 117/71 135/89  Pulse: 103 74  Temp: 98.6 F (37 C) 98.3 F (36.8 C)  Resp: 18 18   Filed Vitals:   02/27/16 1617 02/27/16 2137 02/28/16 0501 02/28/16 0754  BP: 112/67 134/73 117/71 135/89  Pulse: 91 94 103 74  Temp: 98.7 F (37.1 C) 98.5 F (36.9 C) 98.6 F (37 C) 98.3 F (36.8 C)  TempSrc: Oral Oral Oral Oral  Resp: 18 16 18 18   Height:      Weight:  48.2 kg (106 lb 4.2 oz)    SpO2: 95% 94% 100% 100%    General: Pt is alert, awake, not in acute distress Cardiovascular: RRR, S1/S2 +, no rubs, no gallops Respiratory: Diminished breath sounds without any wheezing Abdominal: Soft, NT, ND, bowel sounds + Extremities: no edema, no cyanosis   The results of significant diagnostics from this hospitalization (including imaging, microbiology, ancillary and laboratory) are listed below for reference.    Significant Diagnostic Studies: Dg Chest 2 View  02/25/2016  CLINICAL DATA:  Shortness of breath. Productive cough with green sputum. COPD. Breast cancer. EXAM: CHEST  2 VIEW COMPARISON:  07/01/2014 FINDINGS: Tapering of the peripheral pulmonary vasculature favors emphysema. Right mastectomy with axillary dissection. Cardiac and mediastinal margins appear normal. No bony abnormality observed. IMPRESSION: 1.  Emphysema.  Right mastectomy.  No acute findings. Electronically Signed   By: Van Clines M.D.   On: 02/25/2016 20:01     Microbiology: Recent Results (from the past 240 hour(s))  MRSA PCR Screening     Status: None   Collection Time: 02/25/16 10:31 PM  Result Value Ref Range Status   MRSA by PCR NEGATIVE NEGATIVE Final    Comment:        The GeneXpert MRSA Assay (FDA approved for NASAL specimens only), is one component of a comprehensive MRSA colonization surveillance program. It is not intended to diagnose MRSA infection nor to guide or monitor treatment for MRSA infections.      Labs: Basic Metabolic Panel:  Recent Labs Lab 02/25/16 1726 02/26/16 0338  NA 136 140  K 4.9 4.1  CL 102 105  CO2 26 28  GLUCOSE 173* 203*  BUN 13 14  CREATININE 0.70 0.76  CALCIUM 9.3 9.5   Liver Function Tests: No results for input(s): AST, ALT, ALKPHOS,  BILITOT, PROT, ALBUMIN in the last 168 hours. No results for input(s): LIPASE, AMYLASE in the last 168 hours. No results for input(s): AMMONIA in the last 168 hours. CBC:  Recent Labs Lab 02/25/16 1726 02/26/16 0338  WBC 12.8* 11.4*  HGB 13.9 12.9  HCT 43.3 40.4  MCV 84.4 83.6  PLT 260 263   Cardiac Enzymes:  Recent Labs Lab 02/25/16 1727 02/25/16 2233 02/26/16 0338  TROPONINI 0.07* <0.03 <0.03   BNP: Invalid input(s): POCBNP CBG:  Recent Labs Lab 02/27/16 0806 02/27/16 1205 02/27/16 1619 02/27/16 2133 02/28/16 0750  GLUCAP 114* 107* 141* 154* 306*    Time coordinating discharge:  Greater than 30 minutes  Signed:  Kiyanna Biegler, DO [facility] Pager: [phone] 02/28/2016, 11:54 AM

## 2016-02-28 NOTE — Progress Notes (Signed)
Pt provided with discharge instruction including information on follow up appointments as well as new medications. Pt instructed to see PCP within 1-2 weeks. IV was dc'd without complication. Pt escorted out via wheelchair by hospital volunteer.

## 2016-03-06 ENCOUNTER — Ambulatory Visit: Payer: Medicaid Other | Admitting: Internal Medicine

## 2016-03-06 NOTE — Progress Notes (Signed)
Pt updated with new arrival date and time of Friday, March 08, 2016 at 9:45 A.M. Pt verbalized understanding of all previous pre-op instructions ( reviewed again). Pt chart forwarded to anesthesia due to recent hospital admission.

## 2016-03-08 ENCOUNTER — Inpatient Hospital Stay (HOSPITAL_COMMUNITY): Payer: Medicaid Other | Admitting: Emergency Medicine

## 2016-03-08 ENCOUNTER — Encounter (HOSPITAL_COMMUNITY): Payer: Self-pay | Admitting: Surgery

## 2016-03-08 ENCOUNTER — Encounter (HOSPITAL_COMMUNITY)
Admission: RE | Disposition: A | Discharge status: Home / Self Care | Payer: Self-pay | Source: Ambulatory Visit | Attending: Plastic Surgery

## 2016-03-08 ENCOUNTER — Inpatient Hospital Stay (HOSPITAL_COMMUNITY)
Admission: RE | Admit: 2016-03-08 | Discharge: 2016-03-11 | DRG: 585 | Disposition: A | Discharge status: Home / Self Care | Payer: Medicaid Other | Source: Ambulatory Visit | Attending: Plastic Surgery | Admitting: Plastic Surgery

## 2016-03-08 DIAGNOSIS — C50911 Malignant neoplasm of unspecified site of right female breast: Secondary | ICD-10-CM | POA: Diagnosis not present

## 2016-03-08 DIAGNOSIS — Z79899 Other long term (current) drug therapy: Secondary | ICD-10-CM | POA: Diagnosis not present

## 2016-03-08 DIAGNOSIS — Z8614 Personal history of Methicillin resistant Staphylococcus aureus infection: Secondary | ICD-10-CM | POA: Diagnosis not present

## 2016-03-08 DIAGNOSIS — Z428 Encounter for other plastic and reconstructive surgery following medical procedure or healed injury: Principal | ICD-10-CM

## 2016-03-08 DIAGNOSIS — Z853 Personal history of malignant neoplasm of breast: Secondary | ICD-10-CM

## 2016-03-08 DIAGNOSIS — Z23 Encounter for immunization: Secondary | ICD-10-CM | POA: Diagnosis not present

## 2016-03-08 DIAGNOSIS — C50419 Malignant neoplasm of upper-outer quadrant of unspecified female breast: Secondary | ICD-10-CM

## 2016-03-08 DIAGNOSIS — Z79811 Long term (current) use of aromatase inhibitors: Secondary | ICD-10-CM

## 2016-03-08 DIAGNOSIS — J449 Chronic obstructive pulmonary disease, unspecified: Secondary | ICD-10-CM | POA: Diagnosis present

## 2016-03-08 DIAGNOSIS — Z9011 Acquired absence of right breast and nipple: Secondary | ICD-10-CM

## 2016-03-08 DIAGNOSIS — Z87891 Personal history of nicotine dependence: Secondary | ICD-10-CM | POA: Diagnosis not present

## 2016-03-08 HISTORY — PX: TISSUE EXPANDER PLACEMENT: SHX2530

## 2016-03-08 HISTORY — PX: LATISSIMUS FLAP TO BREAST: SHX5357

## 2016-03-08 LAB — CBC WITH DIFFERENTIAL/PLATELET
BASOS ABS: 0 10*3/uL (ref 0.0–0.1)
BASOS PCT: 0 %
EOS ABS: 0 10*3/uL (ref 0.0–0.7)
EOS PCT: 0 %
HCT: 43.8 % (ref 36.0–46.0)
Hemoglobin: 13.6 g/dL (ref 12.0–15.0)
LYMPHS PCT: 6 %
Lymphs Abs: 1.2 10*3/uL (ref 0.7–4.0)
MCH: 26.6 pg (ref 26.0–34.0)
MCHC: 31.1 g/dL (ref 30.0–36.0)
MCV: 85.7 fL (ref 78.0–100.0)
MONO ABS: 0.5 10*3/uL (ref 0.1–1.0)
Monocytes Relative: 2 %
Neutro Abs: 17.6 10*3/uL — ABNORMAL HIGH (ref 1.7–7.7)
Neutrophils Relative %: 92 %
PLATELETS: 336 10*3/uL (ref 150–400)
RBC: 5.11 MIL/uL (ref 3.87–5.11)
RDW: 15.2 % (ref 11.5–15.5)
WBC: 19.2 10*3/uL — AB (ref 4.0–10.5)

## 2016-03-08 SURGERY — RECONSTRUCTION, BREAST, USING LATISSIMUS DORSI MYOCUTANEOUS FLAP
Anesthesia: General | Site: Chest | Laterality: Right

## 2016-03-08 MED ORDER — LIDOCAINE 2% (20 MG/ML) 5 ML SYRINGE
INTRAMUSCULAR | Status: AC
  Filled 2016-03-08: qty 10

## 2016-03-08 MED ORDER — ONDANSETRON HCL 4 MG/2ML IJ SOLN: 4.0000 mg | Freq: Four times a day (QID) | INTRAMUSCULAR | Status: DC | PRN

## 2016-03-08 MED ORDER — FLUMAZENIL 0.5 MG/5ML IV SOLN
INTRAVENOUS | Status: AC
  Filled 2016-03-08: qty 5

## 2016-03-08 MED ORDER — LEVOTHYROXINE SODIUM 75 MCG PO TABS
75.0000 ug | ORAL_TABLET | Freq: Every day | ORAL | Status: DC
  Administered 2016-03-09 – 2016-03-11 (×3): 75 ug via ORAL
  Filled 2016-03-08 (×3): qty 1

## 2016-03-08 MED ORDER — MORPHINE SULFATE (PF) 2 MG/ML IV SOLN: 1.0000 mg | INTRAVENOUS | Status: DC | PRN

## 2016-03-08 MED ORDER — ANASTROZOLE 1 MG PO TABS
1.0000 mg | ORAL_TABLET | Freq: Every day | ORAL | Status: DC
  Administered 2016-03-09 – 2016-03-11 (×3): 1 mg via ORAL
  Filled 2016-03-08 (×3): qty 1

## 2016-03-08 MED ORDER — ALBUTEROL SULFATE (2.5 MG/3ML) 0.083% IN NEBU
2.5000 mg | INHALATION_SOLUTION | RESPIRATORY_TRACT | Status: DC | PRN
  Administered 2016-03-08 – 2016-03-11 (×6): 2.5 mg via RESPIRATORY_TRACT
  Filled 2016-03-08 (×5): qty 3

## 2016-03-08 MED ORDER — ROCURONIUM BROMIDE 50 MG/5ML IV SOLN
INTRAVENOUS | Status: AC
  Filled 2016-03-08: qty 1

## 2016-03-08 MED ORDER — FENTANYL CITRATE (PF) 100 MCG/2ML IJ SOLN
INTRAMUSCULAR | Status: DC | PRN
  Administered 2016-03-08: 50 ug via INTRAVENOUS
  Administered 2016-03-08: 150 ug via INTRAVENOUS
  Administered 2016-03-08 (×2): 100 ug via INTRAVENOUS
  Administered 2016-03-08 (×2): 50 ug via INTRAVENOUS

## 2016-03-08 MED ORDER — ENSURE PLUS PO LIQD: 237.0000 mL | Freq: Two times a day (BID) | ORAL | Status: DC

## 2016-03-08 MED ORDER — BUPIVACAINE HCL (PF) 0.25 % IJ SOLN
INTRAMUSCULAR | Status: AC
  Filled 2016-03-08: qty 30

## 2016-03-08 MED ORDER — MIDAZOLAM HCL 2 MG/2ML IJ SOLN
INTRAMUSCULAR | Status: AC
  Filled 2016-03-08: qty 2

## 2016-03-08 MED ORDER — LACTATED RINGERS IV SOLN
INTRAVENOUS | Status: DC
  Administered 2016-03-08: 10:00:00 via INTRAVENOUS

## 2016-03-08 MED ORDER — HALOPERIDOL LACTATE 5 MG/ML IJ SOLN
5.0000 mg | Freq: Once | INTRAMUSCULAR | Status: AC
  Administered 2016-03-08 (×2): 1 mg via INTRAVENOUS
  Administered 2016-03-08: 2 mg via INTRAVENOUS
  Administered 2016-03-08: 1 mg via INTRAVENOUS
  Filled 2016-03-08: qty 1

## 2016-03-08 MED ORDER — PHENYLEPHRINE HCL 10 MG/ML IJ SOLN
INTRAMUSCULAR | Status: DC | PRN
  Administered 2016-03-08 (×2): 80 ug via INTRAVENOUS

## 2016-03-08 MED ORDER — BUPIVACAINE LIPOSOME 1.3 % IJ SUSP
20.0000 mL | INTRAMUSCULAR | Status: AC
  Administered 2016-03-08: 20 mL
  Filled 2016-03-08: qty 20

## 2016-03-08 MED ORDER — FENTANYL CITRATE (PF) 250 MCG/5ML IJ SOLN
INTRAMUSCULAR | Status: AC
  Filled 2016-03-08: qty 5

## 2016-03-08 MED ORDER — NALOXONE HCL 0.4 MG/ML IJ SOLN
INTRAMUSCULAR | Status: AC
  Administered 2016-03-08: 80 ug via INTRAVENOUS
  Filled 2016-03-08: qty 1

## 2016-03-08 MED ORDER — ALBUTEROL SULFATE HFA 108 (90 BASE) MCG/ACT IN AERS: 1.0000 | INHALATION_SPRAY | RESPIRATORY_TRACT | Status: DC | PRN

## 2016-03-08 MED ORDER — ONDANSETRON HCL 4 MG/2ML IJ SOLN
INTRAMUSCULAR | Status: AC
  Filled 2016-03-08: qty 2

## 2016-03-08 MED ORDER — ARTIFICIAL TEARS OP OINT
TOPICAL_OINTMENT | OPHTHALMIC | Status: DC | PRN
  Administered 2016-03-08: 1 via OPHTHALMIC

## 2016-03-08 MED ORDER — PROPOFOL 10 MG/ML IV BOLUS
INTRAVENOUS | Status: AC
  Filled 2016-03-08: qty 20

## 2016-03-08 MED ORDER — DEXTROSE 5 % IV SOLN
INTRAVENOUS | Status: DC | PRN
  Administered 2016-03-08: 13:00:00 via INTRAVENOUS

## 2016-03-08 MED ORDER — PROPOFOL 10 MG/ML IV BOLUS
INTRAVENOUS | Status: DC | PRN
  Administered 2016-03-08: 100 mg via INTRAVENOUS

## 2016-03-08 MED ORDER — ONDANSETRON 4 MG PO TBDP: 4.0000 mg | ORAL_TABLET | Freq: Four times a day (QID) | ORAL | Status: DC | PRN

## 2016-03-08 MED ORDER — FENTANYL CITRATE (PF) 100 MCG/2ML IJ SOLN
INTRAMUSCULAR | Status: AC
  Filled 2016-03-08: qty 2

## 2016-03-08 MED ORDER — ENSURE ENLIVE PO LIQD
237.0000 mL | Freq: Two times a day (BID) | ORAL | Status: DC
  Administered 2016-03-09 – 2016-03-11 (×4): 237 mL via ORAL

## 2016-03-08 MED ORDER — VANCOMYCIN HCL IN DEXTROSE 1-5 GM/200ML-% IV SOLN
INTRAVENOUS | Status: AC
  Filled 2016-03-08: qty 200

## 2016-03-08 MED ORDER — FENTANYL CITRATE (PF) 250 MCG/5ML IJ SOLN
INTRAMUSCULAR | Status: AC
  Filled 2016-03-08: qty 10

## 2016-03-08 MED ORDER — ALBUTEROL SULFATE (2.5 MG/3ML) 0.083% IN NEBU
INHALATION_SOLUTION | RESPIRATORY_TRACT | Status: AC
  Filled 2016-03-08: qty 3

## 2016-03-08 MED ORDER — SODIUM CHLORIDE 0.9 % IV SOLN
10.0000 mg | INTRAVENOUS | Status: DC | PRN
  Administered 2016-03-08: 25 ug/min via INTRAVENOUS

## 2016-03-08 MED ORDER — CEFAZOLIN SODIUM 1-5 GM-% IV SOLN
1.0000 g | Freq: Three times a day (TID) | INTRAVENOUS | Status: DC
  Administered 2016-03-08 – 2016-03-11 (×8): 1 g via INTRAVENOUS
  Filled 2016-03-08 (×10): qty 50

## 2016-03-08 MED ORDER — LIDOCAINE HCL 4 % MT SOLN
OROMUCOSAL | Status: DC | PRN
  Administered 2016-03-08: 4 mL via TOPICAL

## 2016-03-08 MED ORDER — ALBUMIN HUMAN 5 % IV SOLN
INTRAVENOUS | Status: DC | PRN
  Administered 2016-03-08 (×2): via INTRAVENOUS

## 2016-03-08 MED ORDER — ARTIFICIAL TEARS OP OINT
TOPICAL_OINTMENT | OPHTHALMIC | Status: AC
  Filled 2016-03-08: qty 3.5

## 2016-03-08 MED ORDER — PHENYLEPHRINE 40 MCG/ML (10ML) SYRINGE FOR IV PUSH (FOR BLOOD PRESSURE SUPPORT)
PREFILLED_SYRINGE | INTRAVENOUS | Status: AC
  Filled 2016-03-08: qty 10

## 2016-03-08 MED ORDER — HYDROMORPHONE HCL 1 MG/ML IJ SOLN: 0.2500 mg | INTRAMUSCULAR | Status: DC | PRN

## 2016-03-08 MED ORDER — METHOCARBAMOL 500 MG PO TABS
500.0000 mg | ORAL_TABLET | Freq: Three times a day (TID) | ORAL | Status: DC | PRN
  Administered 2016-03-10 – 2016-03-11 (×4): 500 mg via ORAL
  Filled 2016-03-08 (×4): qty 1

## 2016-03-08 MED ORDER — KETOROLAC TROMETHAMINE 30 MG/ML IJ SOLN
30.0000 mg | Freq: Three times a day (TID) | INTRAMUSCULAR | Status: AC
  Administered 2016-03-08 – 2016-03-09 (×2): 30 mg via INTRAVENOUS
  Filled 2016-03-08 (×2): qty 1

## 2016-03-08 MED ORDER — ONDANSETRON HCL 4 MG/2ML IJ SOLN
INTRAMUSCULAR | Status: DC | PRN
  Administered 2016-03-08: 4 mg via INTRAVENOUS

## 2016-03-08 MED ORDER — MIDAZOLAM HCL 5 MG/5ML IJ SOLN
INTRAMUSCULAR | Status: DC | PRN
  Administered 2016-03-08: 2 mg via INTRAVENOUS

## 2016-03-08 MED ORDER — DIVALPROEX SODIUM 500 MG PO DR TAB
500.0000 mg | DELAYED_RELEASE_TABLET | Freq: Two times a day (BID) | ORAL | Status: DC
Start: 2016-03-08 — End: 2016-03-11
  Administered 2016-03-08 – 2016-03-09 (×3): 500 mg via ORAL
  Filled 2016-03-08 (×6): qty 1

## 2016-03-08 MED ORDER — SODIUM CHLORIDE 0.9 % IV SOLN
INTRAVENOUS | Status: AC
  Administered 2016-03-08: 14:00:00
  Filled 2016-03-08: qty 1

## 2016-03-08 MED ORDER — ROCURONIUM BROMIDE 100 MG/10ML IV SOLN
INTRAVENOUS | Status: DC | PRN
  Administered 2016-03-08: 40 mg via INTRAVENOUS
  Administered 2016-03-08: 10 mg via INTRAVENOUS

## 2016-03-08 MED ORDER — KCL IN DEXTROSE-NACL 20-5-0.45 MEQ/L-%-% IV SOLN
INTRAVENOUS | Status: DC
  Administered 2016-03-08 – 2016-03-10 (×3): via INTRAVENOUS
  Filled 2016-03-08 (×3): qty 1000

## 2016-03-08 MED ORDER — SODIUM CHLORIDE 0.9 % IJ SOLN
INTRAMUSCULAR | Status: DC | PRN
  Administered 2016-03-08: 20 mL via INTRAVENOUS

## 2016-03-08 MED ORDER — OXYCODONE HCL 5 MG PO TABS
5.0000 mg | ORAL_TABLET | ORAL | Status: DC | PRN
  Administered 2016-03-09 – 2016-03-11 (×6): 10 mg via ORAL
  Filled 2016-03-08 (×6): qty 2

## 2016-03-08 MED ORDER — BUPIVACAINE-EPINEPHRINE 0.25% -1:200000 IJ SOLN
INTRAMUSCULAR | Status: DC | PRN
  Administered 2016-03-08: 30 mL

## 2016-03-08 MED ORDER — DIPHENHYDRAMINE HCL 25 MG PO CAPS
25.0000 mg | ORAL_CAPSULE | Freq: Four times a day (QID) | ORAL | Status: DC | PRN
Start: 2016-03-08 — End: 2016-03-11
  Administered 2016-03-11: 25 mg via ORAL
  Filled 2016-03-08: qty 1

## 2016-03-08 MED ORDER — ALBUMIN HUMAN 5 % IV SOLN: INTRAVENOUS | Status: DC | PRN

## 2016-03-08 MED ORDER — ENOXAPARIN SODIUM 30 MG/0.3ML ~~LOC~~ SOLN
30.0000 mg | SUBCUTANEOUS | Status: DC
  Administered 2016-03-09 – 2016-03-11 (×3): 30 mg via SUBCUTANEOUS
  Filled 2016-03-08 (×3): qty 0.3

## 2016-03-08 MED ORDER — VANCOMYCIN HCL IN DEXTROSE 1-5 GM/200ML-% IV SOLN
1000.0000 mg | Freq: Once | INTRAVENOUS | Status: AC
  Administered 2016-03-08: 1000 mg via INTRAVENOUS

## 2016-03-08 MED ORDER — LIDOCAINE HCL (CARDIAC) 20 MG/ML IV SOLN
INTRAVENOUS | Status: DC | PRN
  Administered 2016-03-08: 60 mg via INTRAVENOUS
  Administered 2016-03-08: 40 mg via INTRAVENOUS

## 2016-03-08 MED ORDER — SUGAMMADEX SODIUM 200 MG/2ML IV SOLN
INTRAVENOUS | Status: DC | PRN
  Administered 2016-03-08: 200 mg via INTRAVENOUS

## 2016-03-08 MED ORDER — SUGAMMADEX SODIUM 200 MG/2ML IV SOLN
INTRAVENOUS | Status: AC
  Filled 2016-03-08: qty 2

## 2016-03-08 MED ORDER — DIPHENHYDRAMINE HCL 50 MG/ML IJ SOLN: 25.0000 mg | Freq: Four times a day (QID) | INTRAMUSCULAR | Status: DC | PRN

## 2016-03-08 MED ORDER — LACTATED RINGERS IV SOLN
INTRAVENOUS | Status: DC | PRN
  Administered 2016-03-08 (×2): via INTRAVENOUS

## 2016-03-08 SURGICAL SUPPLY — 94 items
ADH SKN CLS APL DERMABOND .7 (GAUZE/BANDAGES/DRESSINGS)
APPLIER CLIP 9.375 MED OPEN (MISCELLANEOUS) ×3
APR CLP MED 9.3 20 MLT OPN (MISCELLANEOUS) ×2
BAG DECANTER FOR FLEXI CONT (MISCELLANEOUS) ×3 IMPLANT
BINDER BREAST LRG (GAUZE/BANDAGES/DRESSINGS) ×1 IMPLANT
BINDER BREAST XLRG (GAUZE/BANDAGES/DRESSINGS) IMPLANT
BLADE 10 SAFETY STRL DISP (BLADE) ×2 IMPLANT
BLADE SURG 10 STRL SS (BLADE) ×2 IMPLANT
BLADE SURG 15 STRL LF DISP TIS (BLADE) ×2 IMPLANT
BLADE SURG 15 STRL SS (BLADE)
BNDG COHESIVE 4X5 TAN STRL (GAUZE/BANDAGES/DRESSINGS) ×1 IMPLANT
BNDG GAUZE ELAST 4 BULKY (GAUZE/BANDAGES/DRESSINGS) ×2 IMPLANT
CANISTER SUCTION 2500CC (MISCELLANEOUS) ×3 IMPLANT
CHLORAPREP W/TINT 26ML (MISCELLANEOUS) ×3 IMPLANT
CLIP APPLIE 9.375 MED OPEN (MISCELLANEOUS) ×2 IMPLANT
COVER MAYO STAND STRL (DRAPES) ×1 IMPLANT
COVER SURGICAL LIGHT HANDLE (MISCELLANEOUS) ×3 IMPLANT
DERMABOND ADVANCED (GAUZE/BANDAGES/DRESSINGS)
DERMABOND ADVANCED .7 DNX12 (GAUZE/BANDAGES/DRESSINGS) ×2 IMPLANT
DRAIN CHANNEL 15F RND FF W/TCR (WOUND CARE) ×3 IMPLANT
DRAIN CHANNEL 19F RND (DRAIN) ×5 IMPLANT
DRAPE INCISE 23X17 IOBAN STRL (DRAPES)
DRAPE INCISE 23X17 STRL (DRAPES) IMPLANT
DRAPE INCISE IOBAN 23X17 STRL (DRAPES) IMPLANT
DRAPE INCISE IOBAN 85X60 (DRAPES) ×2 IMPLANT
DRAPE ORTHO SPLIT 77X108 STRL (DRAPES) ×6
DRAPE PROXIMA HALF (DRAPES) ×5 IMPLANT
DRAPE SURG ORHT 6 SPLT 77X108 (DRAPES) ×4 IMPLANT
DRAPE WARM FLUID 44X44 (DRAPE) ×3 IMPLANT
DRSG MEPILEX BORDER 4X8 (GAUZE/BANDAGES/DRESSINGS) ×2 IMPLANT
DRSG PAD ABDOMINAL 8X10 ST (GAUZE/BANDAGES/DRESSINGS) ×5 IMPLANT
ELECT BLADE 4.0 EZ CLEAN MEGAD (MISCELLANEOUS) ×3
ELECT BLADE 6.5 EXT (BLADE) ×2 IMPLANT
ELECT COATED BLADE 2.86 ST (ELECTRODE) ×7 IMPLANT
ELECT REM PT RETURN 9FT ADLT (ELECTROSURGICAL) ×3
ELECTRODE BLDE 4.0 EZ CLN MEGD (MISCELLANEOUS) ×2 IMPLANT
ELECTRODE REM PT RTRN 9FT ADLT (ELECTROSURGICAL) ×2 IMPLANT
EVACUATOR SILICONE 100CC (DRAIN) ×6 IMPLANT
GAUZE SPONGE 4X4 12PLY STRL (GAUZE/BANDAGES/DRESSINGS) ×2 IMPLANT
GAUZE XEROFORM 5X9 LF (GAUZE/BANDAGES/DRESSINGS) ×2 IMPLANT
GLOVE BIO SURGEON STRL SZ 6 (GLOVE) ×8 IMPLANT
GLOVE BIOGEL PI IND STRL 6.5 (GLOVE) IMPLANT
GLOVE BIOGEL PI IND STRL 7.0 (GLOVE) IMPLANT
GLOVE BIOGEL PI IND STRL 7.5 (GLOVE) IMPLANT
GLOVE BIOGEL PI INDICATOR 6.5 (GLOVE) ×1
GLOVE BIOGEL PI INDICATOR 7.0 (GLOVE) ×2
GLOVE BIOGEL PI INDICATOR 7.5 (GLOVE) ×1
GLOVE ECLIPSE 7.5 STRL STRAW (GLOVE) ×1 IMPLANT
GLOVE SURG SS PI 6.0 STRL IVOR (GLOVE) ×2 IMPLANT
GLOVE SURG SS PI 6.5 STRL IVOR (GLOVE) ×1 IMPLANT
GOWN STRL REUS W/ TWL LRG LVL3 (GOWN DISPOSABLE) ×4 IMPLANT
GOWN STRL REUS W/TWL LRG LVL3 (GOWN DISPOSABLE) ×6
KIT BASIN OR (CUSTOM PROCEDURE TRAY) ×3 IMPLANT
KIT ROOM TURNOVER OR (KITS) ×3 IMPLANT
LIQUID BAND (GAUZE/BANDAGES/DRESSINGS) ×6 IMPLANT
MARKER SKIN DUAL TIP RULER LAB (MISCELLANEOUS) ×1 IMPLANT
NEEDLE 22X1 1/2 (OR ONLY) (NEEDLE) ×3 IMPLANT
NS IRRIG 1000ML POUR BTL (IV SOLUTION) ×6 IMPLANT
PACK GENERAL/GYN (CUSTOM PROCEDURE TRAY) ×3 IMPLANT
PAD ABD 8X10 STRL (GAUZE/BANDAGES/DRESSINGS) ×2 IMPLANT
PAD ARMBOARD 7.5X6 YLW CONV (MISCELLANEOUS) ×9 IMPLANT
PEN SKIN MARKING BROAD (MISCELLANEOUS) ×2 IMPLANT
PENCIL BUTTON HOLSTER BLD 10FT (ELECTRODE) IMPLANT
PIN SAFETY STERILE (MISCELLANEOUS) ×2 IMPLANT
SET COLLECT BLD 21X3/4 12 PB (MISCELLANEOUS) ×1 IMPLANT
SLEEVE SURGEON STRL (DRAPES) ×1 IMPLANT
SOLUTION BETADINE 4OZ (MISCELLANEOUS) IMPLANT
SPONGE GAUZE 4X4 12PLY STER LF (GAUZE/BANDAGES/DRESSINGS) ×4 IMPLANT
SPONGE LAP 18X18 X RAY DECT (DISPOSABLE) ×1 IMPLANT
STAPLER VISISTAT 35W (STAPLE) ×3 IMPLANT
STOCKINETTE IMPERVIOUS 9X36 MD (GAUZE/BANDAGES/DRESSINGS) ×1 IMPLANT
STRIP CLOSURE SKIN 1/2X4 (GAUZE/BANDAGES/DRESSINGS) ×4 IMPLANT
SUT ETHILON 2 0 FS 18 (SUTURE) ×6 IMPLANT
SUT MNCRL AB 3-0 PS2 18 (SUTURE) ×4 IMPLANT
SUT MNCRL AB 4-0 PS2 18 (SUTURE) ×7 IMPLANT
SUT MON AB 5-0 PS2 18 (SUTURE) ×4 IMPLANT
SUT PDS AB 2-0 CT1 27 (SUTURE) ×7 IMPLANT
SUT VIC AB 3-0 PS2 18 (SUTURE) ×9
SUT VIC AB 3-0 PS2 18XBRD (SUTURE) ×8 IMPLANT
SUT VIC AB 3-0 SH 8-18 (SUTURE) ×2 IMPLANT
SUT VIC AB 4-0 PS2 27 (SUTURE) ×2 IMPLANT
SUT VICRYL 4-0 PS2 18IN ABS (SUTURE) ×2 IMPLANT
SUT VLOC 180 0 24IN GS25 (SUTURE) IMPLANT
SYR 50ML SLIP (SYRINGE) IMPLANT
SYR BULB IRRIGATION 50ML (SYRINGE) ×3 IMPLANT
SYR CONTROL 10ML LL (SYRINGE) ×3 IMPLANT
TISSUE EXPANDER MV 250CC (Prosthesis & Implant Plastic) ×1 IMPLANT
TOWEL OR 17X24 6PK STRL BLUE (TOWEL DISPOSABLE) ×3 IMPLANT
TOWEL OR 17X26 10 PK STRL BLUE (TOWEL DISPOSABLE) ×3 IMPLANT
TRAY FOLEY CATH 14FRSI W/METER (CATHETERS) ×2 IMPLANT
TRAY FOLEY W/METER SILVER 16FR (SET/KITS/TRAYS/PACK) ×1 IMPLANT
TUBE CONNECTING 12X1/4 (SUCTIONS) ×4 IMPLANT
TUBING BULK SUCTION (MISCELLANEOUS) ×1 IMPLANT
YANKAUER SUCT BULB TIP NO VENT (SUCTIONS) ×1 IMPLANT

## 2016-03-08 NOTE — Interval H&P Note (Signed)
History and Physical Interval Note:  03/08/2016 12:36 PM  Courtney Terry  has presented today for surgery, with the diagnosis of ACQUIRED ABSENCE RIGHT BREAST HISTORY OF BREAST CANCER   The various methods of treatment have been discussed with the patient and family. After consideration of risks, benefits and other options for treatment, the patient has consented to  Procedure(s): RIGHT LATISSIMUS FLAP TO BREAST FOR RECONSTRUCTION  (Right) PLACEMENT OF TISSUE EXPANDER (Right) as a surgical intervention .  The patient's history has been reviewed, patient examined, no change in status, stable for surgery.  I have reviewed the patient's chart and labs.  Questions were answered to the patient's satisfaction.     Leslie Langille

## 2016-03-08 NOTE — Anesthesia Procedure Notes (Signed)
Procedure Name: Intubation Date/Time: 03/08/2016 1:24 PM Performed by: Jacquiline Doe A Pre-anesthesia Checklist: Patient identified, Timeout performed, Emergency Drugs available, Suction available and Patient being monitored Patient Re-evaluated:Patient Re-evaluated prior to inductionOxygen Delivery Method: Circle system utilized Preoxygenation: Pre-oxygenation with 100% oxygen Intubation Type: IV induction and Cricoid Pressure applied Ventilation: Mask ventilation with difficulty and Oral airway inserted - appropriate to patient size Laryngoscope Size: Mac and 4 Grade View: Grade I Tube type: Oral Tube size: 8.0 mm Number of attempts: 1 Airway Equipment and Method: LTA kit utilized and Stylet Placement Confirmation: ETT inserted through vocal cords under direct vision,  breath sounds checked- equal and bilateral and positive ETCO2 Secured at: 22 cm Tube secured with: Tape Dental Injury: Teeth and Oropharynx as per pre-operative assessment

## 2016-03-08 NOTE — Anesthesia Preprocedure Evaluation (Addendum)
Anesthesia Evaluation  Patient identified by MRN, date of birth, ID band Patient awake    Reviewed: Allergy & Precautions, H&P , NPO status , Patient's Chart, lab work & pertinent test results  Airway Mallampati: II  TM Distance: >3 FB Neck ROM: Full    Dental no notable dental hx. (+) Upper Dentures, Dental Advisory Given   Pulmonary asthma , COPD,  COPD inhaler, former smoker,    Pulmonary exam normal breath sounds clear to auscultation       Cardiovascular negative cardio ROS   Rhythm:Regular Rate:Normal     Neuro/Psych Seizures -, Well Controlled,  PSYCHIATRIC DISORDERS Anxiety Depression Bipolar Disorder Schizophrenia    GI/Hepatic negative GI ROS, Neg liver ROS,   Endo/Other  Hypothyroidism   Renal/GU negative Renal ROS  negative genitourinary   Musculoskeletal  (+) Arthritis , Osteoarthritis,    Abdominal   Peds  Hematology negative hematology ROS (+)   Anesthesia Other Findings   Reproductive/Obstetrics negative OB ROS                           Anesthesia Physical Anesthesia Plan  ASA: II  Anesthesia Plan: General   Post-op Pain Management:    Induction: Intravenous  Airway Management Planned: Oral ETT  Additional Equipment:   Intra-op Plan:   Post-operative Plan: Extubation in OR  Informed Consent: I have reviewed the patients History and Physical, chart, labs and discussed the procedure including the risks, benefits and alternatives for the proposed anesthesia with the patient or authorized representative who has indicated his/her understanding and acceptance.   Dental advisory given  Plan Discussed with: CRNA  Anesthesia Plan Comments:        Anesthesia Quick Evaluation

## 2016-03-08 NOTE — H&P (View-Only) (Signed)
  Subjective:    Patient ID: Courtney Terry is a 55 y.o. female.  HPI  5 months post op from placement bilateral implants. Course complicated by exposure of right breast implant, and now 3 months post removal RIGHT implant. Reports has new nurse aide which is the friend that has been accompanying her to visits.  History significant for right NSM and TE reconstruction. Following implant exchange and left augmentation for symmetry, course complicated by gross exposure right breast implant and left breast MRSA infection, and underwent bilateral explant.   Represented with palpable mass that has been growing for 6 months. MMG/US 9 mm palpable mass in the 10 o'clock position of the right breast, 2 cm from the nipple, 6 mm intramammary node or cyst in the 10 o'clock position of the right breast, 1 cm from the nipple, No right axillary adenopathy. ER+/PR- Her 2 -. 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 Nodes negative. Oncotype 30 r. [facility] did not strongly recommend chemotherapy given pshycological issues and patient declined chemotherapy as well. On AI. Last MMG left 12/2014  History significant for diagnosis of right breast cancer 2013 and underwent SLN/lumpectomy. 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 ER+/PR- Her 2 neg. Oncotype score 25 at that time. Patient also referred to genetics and Rad Onc at that time. Patient states she was never told she needed radiation, also does not remember results of genetics test. Chart review states done at [facility] and was negative (no panel specified).   Prior 34 B. Mastectomy weight 113 g COPD. Followed by [facility] for this. +Bipolar d/o, managed by PCP.     Objective:   Physical Exam CV: normal heart sounds PULM: clear to auscultation Chest: left breast soft, able to palpate fold medial upper implant, no contracture Right chest soft tissue envelope contracted and adherent to chest wall, still some  induration  SN to nipple R 16 cm L18 cm BW right 9 L 10 cm Assessment:     Right breast cancer - recurrent  S/p NSM, TE reconstruction (serratus fascia/subpectoral) S/p implant exchange right, left augmentation with saline S/p removal bilateral breast implants, MRSA culture from left breast cavity S/p re placement bilateral saline implants S/p implant exchange right for exposure S/p removal right breast implant  Plan:      Reviewed soft tissue flaps quite thin and failed implant placement x 2. I recommend latissimus flap if she desires additional reconstruction attempts and placement expander again. Reviewed back donor site, possible diminished strength arm, two drains at least, inpatient hospital stay. Reviewed risk seroma, infection, failure flap, extrusion, weakness arm. Last visit stated her worker from mental health suggested SNF or Rehab transfer post any future reconstruction. Surgery at least 3 months after explant to allow soft tissue to soften.   Mentor Siltex Moderate Profile Saline implant. LEFT 175 ml, fill volume 175 ml.   Irene Limbo, MD [facility] [phone]

## 2016-03-08 NOTE — OR Nursing (Addendum)
Cell phone bagged,tagged with patient sticker by T.Sinclair,RN. Phone face appears cracked. Phone to be transferred to PACU with patient at case end. Transferred to C. Genevie Cheshire, RN for transport to PACU.  03/08/2016 1645 :Upon re-gowning patient after procedure was over while still in OR, I found a debit card with another persons name on it in her patient gown pocket. Gave phone and debit card to [employer] with security for safe keeping.

## 2016-03-08 NOTE — Op Note (Signed)
Operative Note   DATE OF OPERATION: 6.2.17  LOCATION: [facility]- inpatient  SURGICAL DIVISION: Plastic Surgery  PREOPERATIVE DIAGNOSES:  1. History right breast cancer 2. Acquired absence breast  POSTOPERATIVE DIAGNOSES:  same  PROCEDURE:  1. Right latissimus flap for breast reconstruction 2. Placement tissue expander right chest  SURGEON: Irene Limbo MD MBA  ASSISTANT: none  ANESTHESIA:  General.   EBL: 50 ml  COMPLICATIONS: None immediate.   INDICATIONS FOR PROCEDURE:  The patient, Courtney Terry, is a 55 y.o. female born on 1961/08/12, is here for breast reconstruction with latissimus flap and expander. Patient has undergone nipple sparing mastectomy. Course has been complicated by infection and exposure requiring removal implant.   FINDINGS: Natrelle [id number]-[id number] 250 ml tissue expander placed, initial fill volume 50 ml. SN [id number]  DESCRIPTION OF PROCEDURE:  The patient's operative site and donor site skin paddle were marked with the patient in the preoperative area. The patient was taken to the operating room. SCDs, Foley were placed and IV antibiotics were given. The patient was placed in left lateral position.The patient's operative site was prepped and draped in a sterile fashion. A time out was performed and all information was confirmed to be correct. Incision made through prior inframammary scar and carried through superficial fascia. The mastectomy flap was elevated with adherent pectoralis muscle. Given the thin soft tissue of patient, I elected to keep the pectoralis elevated with mastectomy flap. Dissection completed toward axilla..  Incision made surrounding skin paddle designed over right back. Skin and superficial fascia elevated off surface of latissimus muscle and subcutaneous tunnel dissected to axilla joining anterior breast cavity. Incision made through prior right axillary scar to aid with transfer of flap. Anterior border of latissimus identified and  elevated. Muscle divided inferiorly at superior iliac spine. Submuscular dissection completed toward midline back and toward origin. Thoracodorsal nerve was identified and divided. Flap rotated into anterior chest cavity. Back irrigated and hemostasis ensured. Exparel infiltrated total 266 mg for chest and back. 15 fr drain placed and secured with 2-0 nylon. Incision closed with 3-0 vicryl in dermis and superficial fascia. Skin closure completed with 4-0 monocryl subcuticular and tissue glue applied.   The flap muscle and skin paddle oriented to defect. The muscle was secured to chest wall, serratus muscle and inferiorly to abdominal wall fascia with interrupted 2-0 PDS. The skin paddle was marked to defect and de epithelialized as needed. The expander was prepared and placed beneath latissimus muscle. The remainder of latissimus inset inferiorly to abdominal wall fascia. Skin paddle inset and inframammary incision closed with 4-0 vicryl in dermis. Skin closure completed running 4-0 monocryl subcuticular. The port was accessed and filled to 60 ml. Tissue adhesive applied to all incisions. Dry dressing and breast binder applied.  The patient was allowed to wake from anesthesia, extubated and taken to the recovery room in satisfactory condition.   SPECIMENS: none  DRAINS: 15 Fr JP in right back, 19 Fr JP in right chest  Irene Limbo, MD [facility] [place] Plastic & Reconstructive Surgery [phone], pin [phone]

## 2016-03-08 NOTE — Transfer of Care (Signed)
Immediate Anesthesia Transfer of Care Note  Patient: Courtney Terry  Procedure(s) Performed: Procedure(s): RIGHT LATISSIMUS FLAP TO BREAST FOR RECONSTRUCTION  (Right) PLACEMENT OF TISSUE EXPANDER (Right)  Patient Location: PACU  Anesthesia Type:General  Level of Consciousness: sedated and responds to stimulation  Airway & Oxygen Therapy: Patient Spontanous Breathing and Patient connected to nasal cannula oxygen  Post-op Assessment: Report given to RN, Patient moving all extremities and Patient moving all extremities X 4  Post vital signs: Reviewed and stable  Last Vitals:  Filed Vitals:   03/08/16 1014  BP: 110/93  Pulse: 103  Temp: 36.9 C  Resp: 18    Last Pain: There were no vitals filed for this visit.    Patients Stated Pain Goal: 0 ([id number])  Complications: No apparent anesthesia complications

## 2016-03-09 NOTE — Progress Notes (Signed)
  POD # 1 right latissimus flap, TE placement  Temp:  [98 F (36.7 C)-99.1 F (37.3 C)] 99.1 F (37.3 C) (06/03 0543) Pulse Rate:  [62-111] 74 (06/03 0543) Resp:  [10-32] 16 (06/03 0543) BP: (90-192)/(51-121) 94/51 mmHg (06/03 0543) SpO2:  [43 %-100 %] 97 % (06/03 0543) Weight:  [48.2 kg (106 lb 4.2 oz)-56.7 kg (125 lb)] 56.7 kg (125 lb) (06/03 0205)   PO not recorded JP back 260 breast 200  Foley out this am, no issues O2 sat overnight  PE Very somnolent, denies pain Chest incisions dry intact soft Back flat, incision dry Drains watery serosanguinous Clear to auscultation  A/P Very somnolent with no IV or oral narcotics given overnight- will observe  continue IV antibiotics while in hospital plan 7 day course total post expander placement OOB ambulate, PT eval Advance diet  Irene Limbo, MD [facility] Plastic & Reconstructive Surgery [phone], pin [phone]

## 2016-03-09 NOTE — Progress Notes (Signed)
Late entry note concerning patient belongings. Her friend has her purse, verified by her sister. Debit card and cell phone were returned to patient by security. Patient's ring and cell phone charger were in the room. Patient claims she had rent money in yellow sock going into operating room. Money was not found.

## 2016-03-10 MED ORDER — SENNOSIDES-DOCUSATE SODIUM 8.6-50 MG PO TABS: 1.0000 | ORAL_TABLET | Freq: Every evening | ORAL | Status: DC | PRN

## 2016-03-10 NOTE — Progress Notes (Signed)
PT Cancellation Note  Patient Details Name: Courtney Terry MRN: [id number] DOB: 29-Nov-1960   Cancelled Treatment:    Reason Eval/Treat Not Completed: Patient using restroom and reports being constipated.  Offered ambulation with PT to assist with getting bowels to move, but pt declined.  Will check back as schedule permits to complete PT eval.   Irene Collings LUBECK 03/10/2016, 1:49 PM

## 2016-03-10 NOTE — Anesthesia Postprocedure Evaluation (Signed)
Anesthesia Post Note  Patient: BRANDACE DATTILO  Procedure(s) Performed: Procedure(s) (LRB): RIGHT LATISSIMUS FLAP TO BREAST FOR RECONSTRUCTION  (Right) PLACEMENT OF TISSUE EXPANDER (Right)  Patient location during evaluation: PACU Anesthesia Type: General Level of consciousness: awake and alert Pain management: pain level controlled Vital Signs Assessment: post-procedure vital signs reviewed and stable Respiratory status: spontaneous breathing, nonlabored ventilation, respiratory function stable and patient connected to nasal cannula oxygen Cardiovascular status: blood pressure returned to baseline and stable Postop Assessment: no signs of nausea or vomiting Anesthetic complications: no    Last Vitals:  Filed Vitals:   03/09/16 2239 03/10/16 0640  BP: 117/69 138/79  Pulse: 98 92  Temp: 37.4 C 37.4 C  Resp: 20 19    Last Pain:  Filed Vitals:   03/10/16 [id number]  PainSc: Asleep                 Catalina Gravel

## 2016-03-10 NOTE — Progress Notes (Signed)
Pt seen with albuterol inhaler from home by PT.  PT alerted RN.  RN spoke with pt about using home medication and advised her not to.  Explained to pt that if she felt SOB, to let RN know and she would administer Albuterol ned treatment.  Pt agree and wanted a neb treatment at that moment.  To be carried out.

## 2016-03-10 NOTE — Progress Notes (Signed)
  POD # 2 right latissimus flap, TE placement  Temp:  [98 F (36.7 C)-99.4 F (37.4 C)] 99.3 F (37.4 C) (06/04 0640) Pulse Rate:  [75-112] 92 (06/04 0640) Resp:  [16-20] 19 (06/04 0640) BP: (98-138)/(56-79) 138/79 mmHg (06/04 0640) SpO2:  [94 %-100 %] 94 % (06/04 0640)   PO 960 JP back 100 breast 70 No IV pain medication over last 24 hours  PE  Chest incisions dry intact soft, flap skin paddle soft Back flat, incision dry, peri incision ecchymoses Drains watery serosanguinous   A/P continue IV antibiotics while in hospital plan 7 day course total post expander placement awaiting PT eval HL IVF, on oral pain medication Nutrition supplements Dispo planning- patient lives alone, will see if able to discharge to SNF  Irene Limbo, MD [facility] Plastic & Reconstructive Surgery [phone], pin [phone]

## 2016-03-10 NOTE — Evaluation (Addendum)
Physical Therapy Evaluation Patient Details Name: Courtney Terry MRN: [id number] DOB: 13-Feb-1961 Today's Date: 03/10/2016   History of Present Illness  Admitted for R Breast reconstruction (latissimus flap) and placement of Tissue expander; has a history of COPD (she is on 2L oxygen [place] at home), former tobacco use, breast cancer, mental illness, and seizure disorder (she is on Depakote), and recent admission with COPD exacerbation  Clinical Impression   Patient is s/p above surgery resulting in functional limitations due to the deficits listed below (see PT Problem List).  Patient will benefit from skilled PT to increase their independence and safety with mobility to allow discharge to the venue listed below.       Follow Up Recommendations No PT follow up;Supervision - [facility] for chronic disease management)    Equipment Recommendations  None recommended by PT    Recommendations for Other Services OT consult     Precautions / Restrictions Precautions Precaution Comments: Educated pt to self-monitor for activity tolerance      Mobility  Bed Mobility Overal bed mobility: Independent                Transfers Overall transfer level: Needs assistance Equipment used: None Transfers: Sit to/from Stand Sit to Stand: Supervision         General transfer comment: Dependent on UEs for balance  Ambulation/Gait Ambulation/Gait assistance: Min guard;Supervision Ambulation Distance (Feet): 200 Feet Assistive device: None (and pushing Dinamap) Gait Pattern/deviations: Step-through pattern     General Gait Details: Cues to self-monitor for activity tolerance; 2 standing rest breaks  Stairs            Wheelchair Mobility    Modified Rankin (Stroke Patients Only)       Balance     Sitting balance-Leahy Scale: Good       Standing balance-Leahy Scale: Fair                               Pertinent Vitals/Pain Pain Assessment:  0-10 Pain Score: 9  Pain Location: Chest pain and R breast pain Pain Descriptors / Indicators: Aching Pain Intervention(s): Monitored during session;RN gave pain meds during session    [place] expects to be discharged to:: Private residence Living Arrangements: Non-relatives/Friends Available Help at Discharge: Personal care attendant (4hrs/day; 7 days/wk) Type of Home: Apartment Home Access: Stairs to enter Entrance Stairs-Rails: None Entrance Stairs-Number of Steps: 3 Home Layout: One level Home Equipment:  (Oxygen) Additional Comments: Has O2 equipment at home    Prior Function Level of Independence: Needs assistance   Gait / Transfers Assistance Needed: independent  ADL's / Homemaking Assistance Needed: needs assistance with IADL and PCA assists with dressing        Hand Dominance   Dominant Hand: Right    Extremity/Trunk Assessment   Upper Extremity Assessment: Generalized weakness           Lower Extremity Assessment: Overall WFL for tasks assessed      Cervical / Trunk Assessment: Normal  Communication   Communication: No difficulties  Cognition Arousal/Alertness: Awake/alert Behavior During Therapy: WFL for tasks assessed/performed;Impulsive Overall Cognitive Status: Within Functional Limits for tasks assessed (history of mental illness)                      General Comments General comments (skin integrity, edema, etc.): Walked on Room Air and O2 sats ranged 90-98%    Exercises  Assessment/Plan    PT Assessment Patient needs continued PT services  PT Diagnosis Other (comment);Acute pain (Decr Functional Capacity)   PT Problem List Decreased activity tolerance;Cardiopulmonary status limiting activity  PT Treatment Interventions DME instruction;Gait training;Stair training;Functional mobility training   PT Goals (Current goals can be found in the Care Plan section) Acute Rehab PT Goals Patient Stated Goal: go  home PT Goal Formulation: With patient Time For Goal Achievement: 03/24/16 Potential to Achieve Goals: Good    Frequency Min 3X/week   Barriers to discharge        Co-evaluation               End of Session Equipment Utilized During Treatment: Other (comment) (Dinamap to monitor oxygen) Activity Tolerance: Patient tolerated treatment well Patient left: in bed;with call bell/phone within reach Nurse Communication: Mobility status         Time: [id number] PT Time Calculation (min) (ACUTE ONLY): 19 min   Charges:   PT Evaluation $PT Eval Moderate Complexity: 1 Procedure     PT G CodesQuin Hoop 03/10/2016, 5:24 PM  Roney Marion, PT  Acute Rehabilitation Services Pager [phone] Office [phone]

## 2016-03-11 ENCOUNTER — Encounter (HOSPITAL_COMMUNITY): Payer: Self-pay | Admitting: Plastic Surgery

## 2016-03-11 MED ORDER — ENSURE PLUS PO LIQD: 1.0000 | Freq: Two times a day (BID) | ORAL | Status: DC

## 2016-03-11 MED ORDER — DOXYCYCLINE HYCLATE 100 MG PO TABS: 100.0000 mg | ORAL_TABLET | Freq: Two times a day (BID) | ORAL | Status: DC

## 2016-03-11 MED ORDER — OXYCODONE HCL 5 MG PO TABS: 5.0000 mg | ORAL_TABLET | ORAL | Status: DC | PRN

## 2016-03-11 NOTE — Discharge Summary (Signed)
Physician Discharge Summary  Patient ID: Courtney Terry MRN: [id number] DOB/AGE: 07-Dec-1960 55 y.o.  Admit date: 03/08/2016 Discharge date: 03/11/2016  Admission Diagnoses: History right breast cancer acquired absence breast  Discharge Diagnoses:  Active Problems:   History of breast cancer  Discharged Condition: stable  Hospital Course: Patient underwent surgery for breast reconstruction. She has had multiple complications in past with exposure and infection implant. Postoperatively she received Narcan and Haldol in PACU. On the floor she did well with minimal pain medication requirement and on oral pain medication only. She was ambulatory without assist on POD #2. She was evaluated by PT and social work. She was not candidate for SNF; this had been suggested by her mental health case worker. Plan to return home.  Treatments: surgery: right latissimus flap with tissue expander placement for breast reconstruction 6.2.17  Discharge Exam: Blood pressure 123/77, pulse 82, temperature 98.4 F (36.9 C), temperature source Oral, resp. rate 20, height 5\' 11"  (1.803 m), weight 56.7 kg (125 lb), SpO2 96 %. Incision/Wound: back flat, dry, drain visible beneath skin, chest soft dry incisions and drains watery serosanguinous  Disposition: 01-Home or Self Care  Discharge Instructions    Call MD for:  redness, tenderness, or signs of infection (pain, swelling, bleeding, redness, odor or green/yellow discharge around incision site)    Complete by:  As directed      Call MD for:  temperature >100.5    Complete by:  As directed      Discharge instructions    Complete by:  As directed   Ok to shower. Soap and water ok, pat incisions dry. No creams or ointments over incisions. Do not let drains dangle in shower, attach to lanyard or similar.Strip and record drains twice daily and bring log to clinic visit.  Breast binder or soft compression bra all other times.  Ok to raise arms above shoulders for  bathing and dressing.  No house yard work or exercise until cleared by MD.     Driving Restrictions    Complete by:  As directed   No driving while taking narcotics     Lifting restrictions    Complete by:  As directed   No lifting greater than 5 lbs     Resume previous diet    Complete by:  As directed             Medication List    STOP taking these medications        predniSONE 20 MG tablet  Commonly known as:  DELTASONE      TAKE these medications        albuterol (2.5 MG/3ML) 0.083% nebulizer solution  Commonly known as:  PROVENTIL  Take 3 mLs (2.5 mg total) by nebulization every 4 (four) hours as needed for wheezing or shortness of breath.     PROAIR HFA 108 (90 Base) MCG/ACT inhaler  Generic drug:  albuterol  INHALE 2 PUFFS INTO THE LUNGS EVERY 4 HOURS AS NEEDED FOR WHEEZING     anastrozole 1 MG tablet  Commonly known as:  ARIMIDEX  Take 1 tablet (1 mg total) by mouth daily.     divalproex 500 MG DR tablet  Commonly known as:  DEPAKOTE  Take 500 mg by mouth 2 (two) times daily.     doxycycline 100 MG tablet  Commonly known as:  VIBRA-TABS  Take 1 tablet (100 mg total) by mouth 2 (two) times daily.     ENSURE PLUS Liqd  Take  1 Can by mouth daily as needed.     levothyroxine 75 MCG tablet  Commonly known as:  SYNTHROID, LEVOTHROID  Take 75 mcg by mouth daily.     oxyCODONE 5 MG immediate release tablet  Commonly known as:  Oxy IR/ROXICODONE  Take 1-2 tablets (5-10 mg total) by mouth every 4 (four) hours as needed for moderate pain.     OXYGEN  Inhale 2 L into the lungs continuous.           Follow-up Information    Follow up with Irene Limbo, MD On 03/20/2016.   Specialty:  Plastic Surgery   Why:  1 30 pm   Contact information:   [address] [place] [address] [id number]       Signed: Irene Limbo 03/11/2016, 12:02 PM

## 2016-03-11 NOTE — Progress Notes (Signed)
Norberto Sorenson to be D/C'd Home per MD order.  Discussed with the patient and all questions fully answered.  Patient refusing [place] to set up oxygen delivery to room for discharge home. Patient stated "im ready to get. I'm breathing fine and have someone wheel me out now". Attempted to explain to patient why she needs oxygen and patient still refusing.   An After Visit Summary was printed and given to the patient. Patient received prescription.  D/c education completed with patient/family including follow up instructions, medication list, d/c activities limitations if indicated, with other d/c instructions as indicated by MD - patient able to verbalize understanding, all questions fully answered.   Patient instructed to return to ED, call 911, or call MD for any changes in condition.   Patient escorted via [place], and D/C home via private auto.  L'[place]PERANCE, Russell Engelstad C 03/11/2016 12:32 PM

## 2016-03-19 ENCOUNTER — Other Ambulatory Visit: Payer: Self-pay | Admitting: Hematology

## 2016-03-19 NOTE — Telephone Encounter (Signed)
Received refill request for arimidex filled originally in Oct 2016 with 3 refills.  Called pt to see if she was taking it & she states that she is & that it is filled at [facility] gave me her counselor's name, Dorene Sorrow & # [phone].  Left message with Barnett Applebaum to call back.  Discussed with Dr Burr Medico & no refills until pt is seen.  She was supposed to have been seen in December.

## 2016-03-20 ENCOUNTER — Other Ambulatory Visit: Payer: Self-pay | Admitting: Plastic Surgery

## 2016-03-20 ENCOUNTER — Telehealth: Payer: Self-pay | Admitting: *Deleted

## 2016-03-20 DIAGNOSIS — N644 Mastodynia: Secondary | ICD-10-CM

## 2016-03-20 DIAGNOSIS — Z853 Personal history of malignant neoplasm of breast: Secondary | ICD-10-CM

## 2016-03-20 DIAGNOSIS — N6489 Other specified disorders of breast: Secondary | ICD-10-CM

## 2016-03-20 NOTE — Telephone Encounter (Signed)
Received return call from [facility] with [facility] & she reports that pt receives her meds in blister packs but pt may only take 50% of the time.  She states that they try to discuss compliance but they can't make her take meds.  Informed that we will not fill med until pt returns for MD visit per Dr Burr Medico.  She will try to f/u with pt tomorrow to schedule appt.

## 2016-03-21 ENCOUNTER — Other Ambulatory Visit: Payer: Self-pay | Admitting: Plastic Surgery

## 2016-03-21 DIAGNOSIS — Z1231 Encounter for screening mammogram for malignant neoplasm of breast: Secondary | ICD-10-CM

## 2016-03-21 DIAGNOSIS — Z853 Personal history of malignant neoplasm of breast: Secondary | ICD-10-CM

## 2016-03-27 ENCOUNTER — Other Ambulatory Visit: Payer: Self-pay | Admitting: Internal Medicine

## 2016-03-27 ENCOUNTER — Ambulatory Visit: Payer: Medicaid Other | Admitting: Internal Medicine

## 2016-03-28 ENCOUNTER — Ambulatory Visit: Payer: Self-pay

## 2016-03-28 ENCOUNTER — Other Ambulatory Visit: Payer: Self-pay

## 2016-03-29 ENCOUNTER — Ambulatory Visit (INDEPENDENT_AMBULATORY_CARE_PROVIDER_SITE_OTHER): Payer: Medicaid Other | Admitting: Internal Medicine

## 2016-03-29 ENCOUNTER — Encounter: Payer: Self-pay | Admitting: Internal Medicine

## 2016-03-29 ENCOUNTER — Other Ambulatory Visit: Payer: Self-pay | Admitting: Internal Medicine

## 2016-03-29 ENCOUNTER — Other Ambulatory Visit: Payer: Medicaid Other

## 2016-03-29 VITALS — BP 106/80 | HR 88 | Ht 71.0 in | Wt 110.0 lb

## 2016-03-29 DIAGNOSIS — J449 Chronic obstructive pulmonary disease, unspecified: Secondary | ICD-10-CM | POA: Diagnosis not present

## 2016-03-29 DIAGNOSIS — J9611 Chronic respiratory failure with hypoxia: Secondary | ICD-10-CM

## 2016-03-29 DIAGNOSIS — F17210 Nicotine dependence, cigarettes, uncomplicated: Secondary | ICD-10-CM

## 2016-03-29 DIAGNOSIS — Z720 Tobacco use: Secondary | ICD-10-CM | POA: Diagnosis not present

## 2016-03-29 MED ORDER — GLYCOPYRROLATE-FORMOTEROL 9-4.8 MCG/ACT IN AERO: 2.0000 | INHALATION_SPRAY | Freq: Two times a day (BID) | RESPIRATORY_TRACT | Status: DC

## 2016-03-29 MED ORDER — ALBUTEROL SULFATE HFA 108 (90 BASE) MCG/ACT IN AERS
INHALATION_SPRAY | RESPIRATORY_TRACT | Status: DC
Start: 2016-03-29 — End: 2016-10-30

## 2016-03-29 NOTE — Patient Instructions (Addendum)
Please remember to go to the lab  department downstairs for your tests - we will call you with the results when they are available.  Try BEVESPI Take 2 puffs first thing in am and then another 2 puffs about 12 hours later.   Only use your albuterol(Proair) as a rescue medication to be used if you can't catch your breath by resting or doing a relaxed purse lip breathing pattern.  - The less you use it, the better it will work when you need it. - Ok to use up to 2 puffs  every 4 hours if you must but call for immediate appointment if use goes up over your usual need - Don't leave home without it !!  (think of it like the spare tire for your car)  Work on inhaler technique:  relax and gently blow all the way out then take a nice smooth deep breath back in, triggering the inhaler at same time you start breathing in.  Hold for up to 5 seconds if you can. Blow out thru nose. Rinse and gargle with water when done      GERD (REFLUX)  is an extremely common cause of respiratory symptoms just like yours , many times with no obvious heartburn at all.    It can be treated with medication, but also with lifestyle changes including elevation of the head of your bed (ideally with 6 inch  bed blocks),  Smoking cessation, avoidance of late meals, excessive alcohol, and avoid fatty foods, chocolate, peppermint, colas, red wine, and acidic juices such as orange juice.  NO MINT OR MENTHOL PRODUCTS SO NO COUGH DROPS  USE SUGARLESS CANDY INSTEAD (Jolley ranchers or Stover's or Life Savers) or even ice chips will also do - the key is to swallow to prevent all throat clearing. NO OIL BASED VITAMINS - use powdered substitutes.    Please schedule a follow up office visit in 4 weeks, sooner if needed

## 2016-03-29 NOTE — Assessment & Plan Note (Signed)
-   PFT's 04/15/13 FEV1  0.56 (18%) ratio 37 and 27% better p B2  DLCO 39 corrects to 60 - added tudorza one bid 05/28/2013 > stopped 10/07/13 no change  - 05/28/2013   Walked RA x one lap @ 185 stopped due to  Sob no tachypnea with sat 93%  - 08/12/2013  Walked RA x 3 laps @ 185 ft each stopped due to end of study sats 88% - 08/26/2013   Walked RA x one lap @ 185 stopped due to sob with sat 97%  - 10/18/2013 p extensive coaching HFA effectiveness =    75% from baseline of < 25% - spirometry 10/18/2013   0.99 (36%) ratio 40  - Alpha one genotype  03/29/2016  - 03/29/2016  After extensive coaching HFA effectiveness =    50% > trial of Bevespi 2bid x 5 weeks samples    DDX of  difficult airways management almost all start with A and  include Adherence, Ace Inhibitors, Acid Reflux, Active Sinus Disease, Alpha 1 Antitripsin deficiency, Anxiety masquerading as Airways dz,  ABPA,  Allergy(esp in young), Aspiration (esp in elderly), Adverse effects of meds,  Active smokers, A bunch of PE's (a small clot burden can't cause this syndrome unless there is already severe underlying pulm or vascular dz with poor reserve) plus two Bs  = Bronchiectasis and Beta blocker use..and one C= CHF   Adherence is always the initial "prime suspect" and is a multilayered concern that requires a "trust but verify" approach in every patient - starting with knowing how to use medications, especially inhalers, correctly, keeping up with refills and understanding the fundamental difference between maintenance and prns vs those medications only taken for a very short course and then stopped and not refilled.  - The proper method of use, as well as anticipated side effects, of a metered-dose inhaler are discussed and demonstrated to the patient. Improved effectiveness after extensive coaching during this visit to a level of approximately 50 % from a baseline of 25 %   - does not know name of PCP "she's at [facility]"   Active smoking (see  separate a/p)   ? Acid (or non-acid) GERD > always difficult to exclude as up to 75% of pts in some series report no assoc GI/ Heartburn symptoms> rec max  diet restrictions/ reviewed and instructions given in writing.    ? Anxiety > usually at the bottom of this list of usual suspects but should be much higher on this pt's based on H and P and note already on psychotropics .    I had an extended discussion with the patient reviewing all relevant studies completed to date and  lasting 25 minutes of a 40  minute extended transition of care/ re-establish office visit    Each maintenance medication was reviewed in detail including most importantly the difference between maintenance and prns and under what circumstances the prns are to be triggered using an action plan format that is not reflected in the computer generated alphabetically organized AVS.    Please see instructions for details which were reviewed in writing and the patient given a copy highlighting the part that I personally wrote and discussed at today's ov.

## 2016-03-29 NOTE — Progress Notes (Signed)
Subjective:     Patient ID: Courtney Terry, female   DOB: January 03, 1961 MRN: [id number]    Brief patient profile:  50 yobf smoker with new sob 2014 referred to pulmonary clinic 02/26/2013 for ? Copd by [facility]hospitalists. Sees NP  Donley Redder  with GOLD IV copd by pft's 04/15/13   HPI 02/26/2013 1st pulmonary ov/ Xylah Early Chief Complaint  Patient presents with  . Acute Visit    Pt c/o increased SOB for the past wk. She states gets SOB just walking short distances such as from lobby to exam room today and also sometimes gets SOB at rest. She has also noticed increased cough and wheezing. Cough is prod with moderate yellow to clear sputum.   indolent onset, variable sob activity and at rest x 3 months better on prednisone with apparent over use of saba at baseline with more day than night time symptoms. Transiently better p saba rec The key is to stop smoking completely before smoking completely stops you!  Prednisone 10 mg take  4 each am x 2 days,   2 each am x 2 days,  1 each am x2days and stop  Plan A= automatic = dulera 200 Take 2 puffs first thing in am and then another 2 puffs about 12 hours later.  Plan B = Backup = Only use your albuterol (red inhaler = proaire) as a rescue medication     04/15/2013 f/u ov/Fusaye Wachtel re gold iv copd/ still smoking Chief Complaint  Patient presents with  . Followup with PFT    Pt states her breathing has improved slightly since the last visit. She c/o nose bleeds on and off for the past 3 wks.    doe x greater than slow walk or > 100 ft, less saba use daytime rec  key is to stop smoking completely before smoking completely stops you!  Work on inhaler technique:     05/28/2013 f/u ov/Kmari Halter copd G IV stop smoking around mid July Chief Complaint  Patient presents with  . COPD    Breathing has gotten worse since last OV. Has to been to the hospital. Reports her breathing is worse at night.  sob sitting still, worse lying down, x 50 ft  rec Plan A = automatic =  dulera 200 2 puffs and Tudorza one puff repeat every 12 hours Plan B = backup = albuterol = proaire/ ventolin >  Only use  l as a rescue medication to be used if you can't catch your breath by resting or doing a relaxed purse lip breathing pattern. The less you use it, the better it will work when you need it. Ok to use up to 2 puffs every 4 hours Plan C = xanax (alprazolam) take 0.5 mg every 4 hours if can't get your breath after using Plan B first   06/03/13  Urine POS cocaine    08/12/2013 f/u ov/Elexis Pollak still smoking  re: copd worse off tudorza  Chief Complaint  Patient presents with  . Follow-up    Pt states DOE worse for the past 3 wks. Gets SOB walking approx 100 ft. Ventolin twice daily for past few weeks  rec Plan A = automatic = dulera 200 2 puffs and Tudorza one puff repeat every 12 hours Plan B = backup = albuterol = proaire/ ventolin >  Only use  l as a rescue medication to be used if you can't catch your breath by resting or doing a relaxed purse lip breathing pattern. The less you use it,  the better it will work when you need it. Ok to use up to 2 puffs every 4 hours Plan C = xanax (alprazolam) take 0.5 mg every 4 hours if can't get your breath after using Plan B first  08/16/13 ER note admitted to ER Doctor use of crack cocaine   08/26/2013 f/u ov/Glennis Borger re: copd/ confused with meds, all inhalers on 0 Demonstrated read back last ov instructions but admitted not following  Chief Complaint  Patient presents with  . Follow-up    Pt c/o increased SOB since her last appt. She states she gets SOB walking short distances. She states has used rescue inhaler 3 times in the past wk.   wakes up with 02 on 4/7 ams with some cough >  mostly white mucus Not able to use dulera or tudorza first thing in am as rec rec Plan A = automatic = dulera 200 2 puffs and tudorza one puff first thing in am and repeat 12 hours later Plan B= backup = ventolin up to 4 puffs every 4 hours if needed For  anxiety can take the xanax up to every 4 hours if needed Please schedule a follow up office visit in 2 weeks> no showed      Admission date: 06/29/2014  Discharge Date: 07/05/2014   Discharge Diagnosis URI (upper respiratory infection) [465.9] Smoker [305.1] COPD exacerbation [491.21] Acute respiratory failure with hypoxia [518.81] COPD mixed type [496]   Principal Problem:  Acute respiratory failure Active Problems:  Cancer of upper-outer quadrant of female breast  COPD GOLD IV with reversibility/ still smoking  Smoker  COPD exacerbation  Hospital Course  1. Acute on chronic respiratory failure due to COPD exacerbation and URI. Much improved after azithromycin, IV steroids and initial BiPAP, now moving decent air and reduced wheezing, will be placed on oral steroid taper, qualified for home oxygen 2 L nasal cannula at all times, given prescriptions for nebulizer treatments and 5 more days of azithromycin. Will follow with her pulmonary physician Dr. Melvyn Novas. 2. History of smoking and cocaine use last 2 days before admission. Counseled to quit. Avoid beta blocker. Also has reactive airway disease.  3. History of seizures. Continue Depakote.  4. Depression anxiety continue Abilify, Prozac and Xanax.  5. History of right-sided breast cancer status post lumpectomy. No acute issues outpatient followup.     07/14/2014 [facility] follow up  Patient returns for a post hospital followup. Patient was admitted September 23 through September 29 for COPD, exacerbation, with acute hypoxic respiratory failure in the setting of polysubstance abuse. Patient was treated with IV antibiotics, steroids, and nebulized bronchodilators. She did require initial BiPAP support. With improved oxygenation. Patient was discharged on a steroid taper. She remains on [facility] Twice daily  . Oxygen 2l/m  Reports breathing is doing well since discharge.  Continues to smoke cigs and crack .   Requests rx  for xanax explained to her that will not be able to prescribe narcotic medications.  Spiriva was added during admission  But was not started on this at discharge.  rec Continue on Dulera 2 puffs Twice daily  , rinse after use  Begin Spiriva 1 puff daily. Most important goal is to quit smoking. Continue on oxygen at 2 L. We will send order to your home care company for a smaller portable device.   09/05/2014 f/u ov/Jerami Tammen re: GOLD IV COPD/ still smoking/ denies cocaine Chief Complaint  Patient presents with  . Follow-up    Pt states her  breathing is unchanged. She states sometimes wakes up in the night and panics b/c her nasal cannula has fallen off. She is using rescue inhaler 3 x per wk on average.   requesting xanax from this clinic says denies by behavior health but "the abilify not helping"  ? If takes it - off xanax the month of November and no worse off it.  rec Walking more regularly while  on 02 is the best way to maintain your conditioning and deal with your nerves but you need to learn to pace yourself  There is no  question that your anxiety is playing big role in your breathing which needs to stay relaxed and your xanax if used at all longterm  should be adjusted by a nerve specialist which is same source as your abilify prescription (we cannot be precribing half your nerve pills here and half somewhere else since we are not nerve specialists)     Admit date: 02/25/2016 Discharge date: 02/28/2016  Home Health:yes Equipment/Devices: 2L oxygen  Discharge Condition: stable CODE STATUS:FULL Diet recommendation: carb modified/heart healthy  Brief/Interim Summary  history of COPD, chronic hypoxic respiratory failure on oxygen 2 L/m, former tobacco abuse, breast cancer, seizure disorder, bipolar disorder/anxiety/depression/schizophrenia, hypothyroid, presented to ED with 4 days history of productive cough, progressive dyspnea and wheezing. Has been out of multiple COPD medications  ([place], albuterol nebulizing solution) she was supposed to go to the OR on 5/22 for revision of reconstructive surgery of her right breast but this has to be postponed. Admitted for COPD exacerbation. Good clinical response to IV steroids and abx. Home with prednisone taper  Discharge Diagnoses:   COPD exacerbation - Treated with oxygen, bronchodilator nebulizations, IV Solu-Medrol and levofloxacin. Rash to levofloxacin last night and changed to oral doxycycline today. - Slowly improving. Continue management. - Needs prescriptions for medications at discharge and outpatient follow-up with Dr. Melvyn Novas, pulmonology. -home with prednisone taper 50mg -->40-->30-->20-->10 over 5 days -home with doxy x 2 more days to finish 5 days of tx  Acute on chronic hypoxic respiratory failure - Secondary to COPD exacerbation. Acute respiratory failure resolved. - Single minimally elevated troponin, likely from demand ischemia. Subsequent 2 troponins negative.  Seizure disorder - Continue Depakote  Hypothyroid - Continue levothyroxine  History of breast cancer - Continue Arimidex. Surgery postponed  Bipolar disorder/schizophrenia - May be on Depakote for this and seizures. Seems stable.  Hyperglycemia - Check A1c/6.2 and place on SSI. - will need PCP follow up to discuss future need to start oral hypoglycemic meds  Severe malnutrition -continue ensure   03/29/2016  Transition of care post hosp f/u ov/Elverda Wendel re: copd ? Gold IV / still smoking some/ no rx  Chief Complaint  Patient presents with  . Follow-up    Pt c/o breathing progressively worse since the last visit here in 2015. She states that she is SOB "all the time" with or without exertion.   last smoked anything 2 days ago but no longer buying any and denies elicit drug use Has no medication at all for  breathing, says "they won't give it to me s seeing you first (not true) " Has 02 just uses 2lpm at hs  [facility] = can't walk 100 yards even  at a slow pace at a flat grade s stopping due to sob  eg rides scooter at grocery store / 02 doesn't help ex tol   No obvious day to day or daytime variability or assoc excess/ purulent sputum or mucus plugs or  hemoptysis or cp or chest tightness, subjective wheeze or overt sinus or hb symptoms. No unusual exp hx or h/o childhood pna/ asthma or knowledge of premature birth.  Sleeping ok without nocturnal  or early am exacerbation  of respiratory  c/o's or need for noct saba. Also denies any obvious fluctuation of symptoms with weather or environmental changes or other aggravating or alleviating factors except as outlined above   Current Medications, Allergies, Complete Past Medical History, Past Surgical History, Family History, and Social History were reviewed in [employer] record.  ROS  The following are not active complaints unless bolded sore throat, dysphagia, dental problems, itching, sneezing,  nasal congestion or excess/ purulent secretions, ear ache,   fever, chills, sweats, unintended wt loss, classically pleuritic or exertional cp,  orthopnea pnd or leg swelling, presyncope, palpitations, abdominal pain, anorexia, nausea, vomiting, diarrhea  or change in bowel or bladder habits, change in stools or urine, dysuria,hematuria,  rash, arthralgias, visual complaints, headache, numbness, weakness or ataxia or problems with walking or coordination,  change in mood/affect or memory = very anxious/ wants xanax                Objective:   Physical Exam   04/15/2013   134  Vs 142  05/28/13 > 08/12/2013 144 > 08/26/2013  145 > 147  10/18/2013  >137 07/14/2014 > 09/05/2014 142 > 03/29/2016   110   amb anxious bf nad , anxious / freq vigrous  throat clearing    HEENT mild turbinate edema.  Oropharynx no thrush or excess pnd or cobblestoning.  No JVD or cervical adenopathy. Mild accessory muscle hypertrophy. Trachea midline, nl thryroid. Chest was hyperinflated by percussion  with diminished breath sounds and moderate increased exp time w/ no wheezing . Hoover sign positive at mid inspiration. Regular rate and rhythm without murmur gallop or rub or increase P2 or edema.  Abd: no hsm, nl excursion. Ext warm without cyanosis or clubbing.         I personally reviewed images and agree with radiology impression as follows:  CXR:  02/25/16  Emphysema. Right mastectomy. No acute findings

## 2016-04-01 ENCOUNTER — Encounter: Payer: Self-pay | Admitting: Internal Medicine

## 2016-04-01 LAB — ALPHA-1-ANTITRYPSIN: A-1 Antitrypsin, Ser: 185 mg/dL (ref 83–199)

## 2016-04-01 NOTE — Assessment & Plan Note (Signed)
02 2lpm hs since admit 02/2016 - 03/29/2016   Walked RA  2 laps @ 185 ft each stopped due to  Sob/ sats 88% slow pace  As of 03/29/2016 02 2lpm hs and prn daytime  There is not evidence in stable/ chronic ex desats that 02 rx changes outcome,  May improve ex tol though she says in her case it doesn't so so to just use at hs and prn

## 2016-04-01 NOTE — Assessment & Plan Note (Signed)

## 2016-04-03 LAB — ALPHA-1 ANTITRYPSIN PHENOTYPE: A-1 Antitrypsin: 183 mg/dL (ref 83–199)

## 2016-04-04 ENCOUNTER — Other Ambulatory Visit: Payer: Self-pay | Admitting: Plastic Surgery

## 2016-04-04 DIAGNOSIS — Z1231 Encounter for screening mammogram for malignant neoplasm of breast: Secondary | ICD-10-CM

## 2016-04-04 DIAGNOSIS — Z9011 Acquired absence of right breast and nipple: Secondary | ICD-10-CM

## 2016-04-04 DIAGNOSIS — Z853 Personal history of malignant neoplasm of breast: Secondary | ICD-10-CM

## 2016-04-04 NOTE — Progress Notes (Signed)
Quick Note:  Left detailed msg with results ______

## 2016-04-08 ENCOUNTER — Telehealth: Payer: Self-pay

## 2016-04-08 NOTE — Telephone Encounter (Signed)
Thu, please call pt and schedule her to see me soon, thanks  Kimberly-Clark

## 2016-04-08 NOTE — Telephone Encounter (Signed)
Courtney Terry with [employer] called about a refill of pt's anastrozole. Noted 6/14 note the anastrazole will not be filled until pt sees MD. Courtney Terry stated that someone would be talking with the pt today and will tell her she needs to call the office and schedule an appt. (No POF noted to schedule the pt.)

## 2016-04-10 ENCOUNTER — Ambulatory Visit: Payer: Self-pay

## 2016-04-16 ENCOUNTER — Telehealth: Payer: Self-pay | Admitting: Internal Medicine

## 2016-04-16 MED ORDER — GLYCOPYRROLATE-FORMOTEROL 9-4.8 MCG/ACT IN AERO: 2.0000 | INHALATION_SPRAY | Freq: Two times a day (BID) | RESPIRATORY_TRACT | Status: DC

## 2016-04-16 NOTE — Telephone Encounter (Signed)
Called and spoke with pt and she stated that she needed a refill of the bevespi that was given to her at her last appt with MW. This has been sent to her pharmacy and pt is aware. Nothing further is needed.

## 2016-04-16 NOTE — Telephone Encounter (Signed)
POF sent to scheduler 

## 2016-04-17 ENCOUNTER — Ambulatory Visit: Payer: Self-pay

## 2016-04-18 ENCOUNTER — Telehealth: Payer: Self-pay | Admitting: Internal Medicine

## 2016-04-18 ENCOUNTER — Telehealth: Payer: Self-pay | Admitting: Hematology

## 2016-04-18 NOTE — Telephone Encounter (Signed)
lvm to inform pt of 7/19 appt at 845 am per pof

## 2016-04-18 NOTE — Telephone Encounter (Signed)
Called [employer] at [phone]. Pt ID >> [id number] T. PA was started for [facility]. Medication was approved. Approval #: [id number].  Called [facility] and spoke with Phil. He is aware that Charolotte Eke has been approved. Nothing further was needed.

## 2016-04-23 ENCOUNTER — Other Ambulatory Visit: Payer: Self-pay | Admitting: Plastic Surgery

## 2016-04-23 ENCOUNTER — Ambulatory Visit
Admission: RE | Admit: 2016-04-23 | Discharge: 2016-04-23 | Disposition: A | Discharge status: Home / Self Care | Payer: Medicaid Other | Source: Ambulatory Visit | Attending: Plastic Surgery | Admitting: Plastic Surgery

## 2016-04-23 ENCOUNTER — Encounter: Payer: Self-pay | Admitting: Hematology

## 2016-04-23 ENCOUNTER — Ambulatory Visit (HOSPITAL_BASED_OUTPATIENT_CLINIC_OR_DEPARTMENT_OTHER): Payer: Medicaid Other | Admitting: Hematology

## 2016-04-23 ENCOUNTER — Other Ambulatory Visit: Payer: Self-pay | Admitting: *Deleted

## 2016-04-23 ENCOUNTER — Ambulatory Visit (HOSPITAL_BASED_OUTPATIENT_CLINIC_OR_DEPARTMENT_OTHER): Payer: Medicaid Other

## 2016-04-23 VITALS — BP 103/68 | HR 88 | Temp 98.3°F | Resp 16 | Ht 71.0 in | Wt 108.6 lb

## 2016-04-23 DIAGNOSIS — Z9011 Acquired absence of right breast and nipple: Secondary | ICD-10-CM

## 2016-04-23 DIAGNOSIS — Z79811 Long term (current) use of aromatase inhibitors: Secondary | ICD-10-CM

## 2016-04-23 DIAGNOSIS — Z853 Personal history of malignant neoplasm of breast: Secondary | ICD-10-CM

## 2016-04-23 DIAGNOSIS — Z1231 Encounter for screening mammogram for malignant neoplasm of breast: Secondary | ICD-10-CM

## 2016-04-23 DIAGNOSIS — C50411 Malignant neoplasm of upper-outer quadrant of right female breast: Secondary | ICD-10-CM

## 2016-04-23 DIAGNOSIS — Z170 Estrogen receptor positive status [ER+]: Secondary | ICD-10-CM | POA: Diagnosis not present

## 2016-04-23 DIAGNOSIS — C50419 Malignant neoplasm of upper-outer quadrant of unspecified female breast: Secondary | ICD-10-CM

## 2016-04-23 DIAGNOSIS — E43 Unspecified severe protein-calorie malnutrition: Secondary | ICD-10-CM

## 2016-04-23 DIAGNOSIS — J449 Chronic obstructive pulmonary disease, unspecified: Secondary | ICD-10-CM | POA: Insufficient documentation

## 2016-04-23 DIAGNOSIS — J42 Unspecified chronic bronchitis: Secondary | ICD-10-CM

## 2016-04-23 DIAGNOSIS — F209 Schizophrenia, unspecified: Secondary | ICD-10-CM | POA: Insufficient documentation

## 2016-04-23 LAB — CBC WITH DIFFERENTIAL/PLATELET
BASO%: 0.3 % (ref 0.0–2.0)
BASOS ABS: 0 10*3/uL (ref 0.0–0.1)
EOS ABS: 0.1 10*3/uL (ref 0.0–0.5)
EOS%: 1.7 % (ref 0.0–7.0)
HCT: 40.9 % (ref 34.8–46.6)
HGB: 13 g/dL (ref 11.6–15.9)
LYMPH%: 18.9 % (ref 14.0–49.7)
MCH: 26.7 pg (ref 25.1–34.0)
MCHC: 31.8 g/dL (ref 31.5–36.0)
MCV: 84.2 fL (ref 79.5–101.0)
MONO#: 0.6 10*3/uL (ref 0.1–0.9)
MONO%: 8.7 % (ref 0.0–14.0)
NEUT#: 5 10*3/uL (ref 1.5–6.5)
NEUT%: 70.4 % (ref 38.4–76.8)
Platelets: 267 10*3/uL (ref 145–400)
RBC: 4.86 10*6/uL (ref 3.70–5.45)
RDW: 16.1 % — AB (ref 11.2–14.5)
WBC: 7.1 10*3/uL (ref 3.9–10.3)
lymph#: 1.3 10*3/uL (ref 0.9–3.3)

## 2016-04-23 LAB — COMPREHENSIVE METABOLIC PANEL
ALBUMIN: 3.2 g/dL — AB (ref 3.5–5.0)
ALK PHOS: 100 U/L (ref 40–150)
ALT: 13 U/L (ref 0–55)
ANION GAP: 7 meq/L (ref 3–11)
AST: 18 U/L (ref 5–34)
BUN: 5.9 mg/dL — ABNORMAL LOW (ref 7.0–26.0)
CO2: 29 meq/L (ref 22–29)
Calcium: 8.9 mg/dL (ref 8.4–10.4)
Chloride: 104 mEq/L (ref 98–109)
Creatinine: 0.6 mg/dL (ref 0.6–1.1)
GLUCOSE: 97 mg/dL (ref 70–140)
POTASSIUM: 4 meq/L (ref 3.5–5.1)
SODIUM: 140 meq/L (ref 136–145)
Total Bilirubin: 0.36 mg/dL (ref 0.20–1.20)
Total Protein: 6.3 g/dL — ABNORMAL LOW (ref 6.4–8.3)

## 2016-04-23 MED ORDER — ANASTROZOLE 1 MG PO TABS: 1.0000 mg | ORAL_TABLET | Freq: Every day | ORAL | Status: DC
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
## 2016-04-24 ENCOUNTER — Other Ambulatory Visit: Payer: Self-pay

## 2016-04-24 ENCOUNTER — Ambulatory Visit: Payer: Self-pay | Admitting: Hematology

## 2016-04-27 ENCOUNTER — Telehealth: Payer: Self-pay | Admitting: Hematology

## 2016-04-27 NOTE — Telephone Encounter (Signed)
Lvm advising appts 8/2 bone scan and 11/17 @ 12.45pm.

## 2016-04-29 ENCOUNTER — Ambulatory Visit: Payer: Self-pay | Admitting: Internal Medicine

## 2016-05-06 ENCOUNTER — Telehealth: Payer: Self-pay | Admitting: *Deleted

## 2016-05-06 NOTE — Telephone Encounter (Signed)
"  What happened to my chemotherapy medication refill from 04-23-2016.  I do not have the Anastrozole yet.  I also need to know where my scan is tomorrow."  Advised order was called in to [facility] and the scan is at [place], [place]. On [address].  Spoke with counselor at [facility] to learn the refill is not in the current bubble pack but she will check with pharmacy.

## 2016-05-08 ENCOUNTER — Other Ambulatory Visit: Payer: Self-pay

## 2016-06-10 ENCOUNTER — Encounter (HOSPITAL_COMMUNITY): Payer: Self-pay | Admitting: Neurology

## 2016-06-10 ENCOUNTER — Emergency Department (HOSPITAL_COMMUNITY)
Admission: EM | Admit: 2016-06-10 | Discharge: 2016-06-10 | Disposition: A | Discharge status: Home / Self Care | Payer: Medicaid Other | Attending: Emergency Medicine | Admitting: Emergency Medicine

## 2016-06-10 DIAGNOSIS — E039 Hypothyroidism, unspecified: Secondary | ICD-10-CM | POA: Insufficient documentation

## 2016-06-10 DIAGNOSIS — J441 Chronic obstructive pulmonary disease with (acute) exacerbation: Secondary | ICD-10-CM

## 2016-06-10 DIAGNOSIS — J42 Unspecified chronic bronchitis: Secondary | ICD-10-CM

## 2016-06-10 DIAGNOSIS — Z853 Personal history of malignant neoplasm of breast: Secondary | ICD-10-CM | POA: Insufficient documentation

## 2016-06-10 DIAGNOSIS — R0602 Shortness of breath: Secondary | ICD-10-CM | POA: Diagnosis present

## 2016-06-10 DIAGNOSIS — F17210 Nicotine dependence, cigarettes, uncomplicated: Secondary | ICD-10-CM | POA: Diagnosis not present

## 2016-06-10 DIAGNOSIS — Z79899 Other long term (current) drug therapy: Secondary | ICD-10-CM | POA: Insufficient documentation

## 2016-06-10 NOTE — ED Notes (Signed)
Pt given turkey sandwich and coke 

## 2016-06-10 NOTE — ED Notes (Signed)
Pt given O2 tank from home health care for d/c.

## 2016-06-10 NOTE — ED Notes (Signed)
Pt stated that she did not want her O2 turned on because her sister smokes and she is getting a ride home from her sister.
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
## 2016-06-10 NOTE — Discharge Instructions (Signed)
Call Dr.Osei-bonsu and ask for an appointment so he can help you to stop smoking. Return if your condition worsens for any reason
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
## 2016-06-10 NOTE — Discharge Planning (Signed)
[facility] consulted to assist pt with obtaining portable O2 tank for discharge home.  EDCM contacted [facility] liaison, Melene Muller to deliver tank to pt room prior to discharge home.

## 2016-06-13 ENCOUNTER — Other Ambulatory Visit: Payer: Self-pay | Admitting: Hematology

## 2016-06-13 DIAGNOSIS — C50419 Malignant neoplasm of upper-outer quadrant of unspecified female breast: Secondary | ICD-10-CM

## 2016-08-10 ENCOUNTER — Inpatient Hospital Stay (HOSPITAL_COMMUNITY)
Admission: EM | Admit: 2016-08-10 | Discharge: 2016-08-15 | DRG: 208 | Disposition: A | Discharge status: Home / Self Care | Payer: Medicaid Other | Attending: Internal Medicine | Admitting: Internal Medicine

## 2016-08-10 ENCOUNTER — Emergency Department (HOSPITAL_COMMUNITY): Payer: Medicaid Other

## 2016-08-10 ENCOUNTER — Encounter (HOSPITAL_COMMUNITY): Payer: Self-pay | Admitting: Emergency Medicine

## 2016-08-10 DIAGNOSIS — F1410 Cocaine abuse, uncomplicated: Secondary | ICD-10-CM | POA: Diagnosis present

## 2016-08-10 DIAGNOSIS — J9622 Acute and chronic respiratory failure with hypercapnia: Secondary | ICD-10-CM | POA: Diagnosis present

## 2016-08-10 DIAGNOSIS — J449 Chronic obstructive pulmonary disease, unspecified: Secondary | ICD-10-CM | POA: Diagnosis not present

## 2016-08-10 DIAGNOSIS — F419 Anxiety disorder, unspecified: Secondary | ICD-10-CM | POA: Diagnosis present

## 2016-08-10 DIAGNOSIS — G40909 Epilepsy, unspecified, not intractable, without status epilepticus: Secondary | ICD-10-CM | POA: Diagnosis present

## 2016-08-10 DIAGNOSIS — R7889 Finding of other specified substances, not normally found in blood: Secondary | ICD-10-CM | POA: Diagnosis not present

## 2016-08-10 DIAGNOSIS — I214 Non-ST elevation (NSTEMI) myocardial infarction: Secondary | ICD-10-CM | POA: Diagnosis present

## 2016-08-10 DIAGNOSIS — Z9882 Breast implant status: Secondary | ICD-10-CM | POA: Diagnosis not present

## 2016-08-10 DIAGNOSIS — R7989 Other specified abnormal findings of blood chemistry: Secondary | ICD-10-CM

## 2016-08-10 DIAGNOSIS — Z79811 Long term (current) use of aromatase inhibitors: Secondary | ICD-10-CM

## 2016-08-10 DIAGNOSIS — E872 Acidosis: Secondary | ICD-10-CM | POA: Diagnosis present

## 2016-08-10 DIAGNOSIS — J441 Chronic obstructive pulmonary disease with (acute) exacerbation: Secondary | ICD-10-CM | POA: Diagnosis present

## 2016-08-10 DIAGNOSIS — Z79899 Other long term (current) drug therapy: Secondary | ICD-10-CM

## 2016-08-10 DIAGNOSIS — E43 Unspecified severe protein-calorie malnutrition: Secondary | ICD-10-CM | POA: Diagnosis present

## 2016-08-10 DIAGNOSIS — Z4659 Encounter for fitting and adjustment of other gastrointestinal appliance and device: Secondary | ICD-10-CM

## 2016-08-10 DIAGNOSIS — Z23 Encounter for immunization: Secondary | ICD-10-CM

## 2016-08-10 DIAGNOSIS — J9601 Acute respiratory failure with hypoxia: Secondary | ICD-10-CM | POA: Diagnosis not present

## 2016-08-10 DIAGNOSIS — F17210 Nicotine dependence, cigarettes, uncomplicated: Secondary | ICD-10-CM | POA: Diagnosis present

## 2016-08-10 DIAGNOSIS — Z9289 Personal history of other medical treatment: Secondary | ICD-10-CM

## 2016-08-10 DIAGNOSIS — R0602 Shortness of breath: Secondary | ICD-10-CM | POA: Diagnosis present

## 2016-08-10 DIAGNOSIS — Z853 Personal history of malignant neoplasm of breast: Secondary | ICD-10-CM | POA: Diagnosis not present

## 2016-08-10 DIAGNOSIS — Z978 Presence of other specified devices: Secondary | ICD-10-CM

## 2016-08-10 DIAGNOSIS — F4310 Post-traumatic stress disorder, unspecified: Secondary | ICD-10-CM | POA: Diagnosis present

## 2016-08-10 DIAGNOSIS — Z8049 Family history of malignant neoplasm of other genital organs: Secondary | ICD-10-CM

## 2016-08-10 DIAGNOSIS — J9612 Chronic respiratory failure with hypercapnia: Secondary | ICD-10-CM | POA: Diagnosis not present

## 2016-08-10 DIAGNOSIS — R451 Restlessness and agitation: Secondary | ICD-10-CM | POA: Diagnosis present

## 2016-08-10 DIAGNOSIS — J9621 Acute and chronic respiratory failure with hypoxia: Secondary | ICD-10-CM

## 2016-08-10 DIAGNOSIS — F319 Bipolar disorder, unspecified: Secondary | ICD-10-CM | POA: Diagnosis present

## 2016-08-10 DIAGNOSIS — Z9011 Acquired absence of right breast and nipple: Secondary | ICD-10-CM | POA: Diagnosis not present

## 2016-08-10 DIAGNOSIS — E039 Hypothyroidism, unspecified: Secondary | ICD-10-CM | POA: Diagnosis present

## 2016-08-10 DIAGNOSIS — D72829 Elevated white blood cell count, unspecified: Secondary | ICD-10-CM | POA: Diagnosis present

## 2016-08-10 DIAGNOSIS — F1910 Other psychoactive substance abuse, uncomplicated: Secondary | ICD-10-CM | POA: Diagnosis present

## 2016-08-10 DIAGNOSIS — F209 Schizophrenia, unspecified: Secondary | ICD-10-CM | POA: Diagnosis present

## 2016-08-10 DIAGNOSIS — Z681 Body mass index (BMI) 19 or less, adult: Secondary | ICD-10-CM | POA: Diagnosis not present

## 2016-08-10 DIAGNOSIS — Z8249 Family history of ischemic heart disease and other diseases of the circulatory system: Secondary | ICD-10-CM

## 2016-08-10 DIAGNOSIS — Z803 Family history of malignant neoplasm of breast: Secondary | ICD-10-CM | POA: Diagnosis not present

## 2016-08-10 DIAGNOSIS — Z01818 Encounter for other preprocedural examination: Secondary | ICD-10-CM

## 2016-08-10 DIAGNOSIS — R778 Other specified abnormalities of plasma proteins: Secondary | ICD-10-CM | POA: Diagnosis present

## 2016-08-10 DIAGNOSIS — I248 Other forms of acute ischemic heart disease: Secondary | ICD-10-CM | POA: Diagnosis present

## 2016-08-10 DIAGNOSIS — Z881 Allergy status to other antibiotic agents status: Secondary | ICD-10-CM

## 2016-08-10 DIAGNOSIS — R748 Abnormal levels of other serum enzymes: Secondary | ICD-10-CM | POA: Diagnosis not present

## 2016-08-10 DIAGNOSIS — R4182 Altered mental status, unspecified: Secondary | ICD-10-CM | POA: Diagnosis not present

## 2016-08-10 DIAGNOSIS — J9692 Respiratory failure, unspecified with hypercapnia: Secondary | ICD-10-CM | POA: Diagnosis present

## 2016-08-10 LAB — I-STAT ARTERIAL BLOOD GAS, ED
ACID-BASE EXCESS: 1 mmol/L (ref 0.0–2.0)
BICARBONATE: 30.1 mmol/L — AB (ref 20.0–28.0)
O2 Saturation: 94 %
PCO2 ART: 68.5 mmHg — AB (ref 32.0–48.0)
PO2 ART: 84 mmHg (ref 83.0–108.0)
Patient temperature: 98
TCO2: 32 mmol/L (ref 0–100)
pH, Arterial: 7.249 — ABNORMAL LOW (ref 7.350–7.450)

## 2016-08-10 LAB — BASIC METABOLIC PANEL
Anion gap: 13 (ref 5–15)
BUN: 10 mg/dL (ref 6–20)
CO2: 24 mmol/L (ref 22–32)
CREATININE: 1.03 mg/dL — AB (ref 0.44–1.00)
Calcium: 9.1 mg/dL (ref 8.9–10.3)
Chloride: 100 mmol/L — ABNORMAL LOW (ref 101–111)
Glucose, Bld: 144 mg/dL — ABNORMAL HIGH (ref 65–99)
POTASSIUM: 4.9 mmol/L (ref 3.5–5.1)
SODIUM: 137 mmol/L (ref 135–145)

## 2016-08-10 LAB — CBC WITH DIFFERENTIAL/PLATELET
BASOS ABS: 0 10*3/uL (ref 0.0–0.1)
Basophils Relative: 0 %
Eosinophils Absolute: 0.3 10*3/uL (ref 0.0–0.7)
Eosinophils Relative: 2 %
HCT: 47 % — ABNORMAL HIGH (ref 36.0–46.0)
Hemoglobin: 15 g/dL (ref 12.0–15.0)
LYMPHS ABS: 4.4 10*3/uL — AB (ref 0.7–4.0)
Lymphocytes Relative: 26 %
MCH: 27.1 pg (ref 26.0–34.0)
MCHC: 31.9 g/dL (ref 30.0–36.0)
MCV: 84.8 fL (ref 78.0–100.0)
MONO ABS: 1.5 10*3/uL — AB (ref 0.1–1.0)
MONOS PCT: 9 %
NEUTROS PCT: 63 %
Neutro Abs: 10.6 10*3/uL — ABNORMAL HIGH (ref 1.7–7.7)
PLATELETS: 274 10*3/uL (ref 150–400)
RBC: 5.54 MIL/uL — AB (ref 3.87–5.11)
RDW: 16.2 % — AB (ref 11.5–15.5)
WBC: 16.8 10*3/uL — AB (ref 4.0–10.5)

## 2016-08-10 LAB — I-STAT TROPONIN, ED
Troponin i, poc: 0.1 ng/mL (ref 0.00–0.08)
Troponin i, poc: 0.12 ng/mL (ref 0.00–0.08)

## 2016-08-10 LAB — BRAIN NATRIURETIC PEPTIDE: B Natriuretic Peptide: 84.4 pg/mL (ref 0.0–100.0)

## 2016-08-10 MED ORDER — PROPOFOL 10 MG/ML IV BOLUS: 0.5000 mg/kg | Freq: Once | INTRAVENOUS | Status: DC

## 2016-08-10 MED ORDER — IPRATROPIUM BROMIDE 0.02 % IN SOLN: 0.5000 mg | Freq: Once | RESPIRATORY_TRACT | Status: DC

## 2016-08-10 MED ORDER — PROPOFOL 1000 MG/100ML IV EMUL
INTRAVENOUS | Status: AC
  Filled 2016-08-10: qty 100

## 2016-08-10 MED ORDER — SUCCINYLCHOLINE CHLORIDE 20 MG/ML IJ SOLN
INTRAMUSCULAR | Status: AC | PRN
  Administered 2016-08-10: 75 mg via INTRAVENOUS

## 2016-08-10 MED ORDER — LORAZEPAM 2 MG/ML IJ SOLN
1.0000 mg | Freq: Once | INTRAMUSCULAR | Status: AC
  Administered 2016-08-10: 1 mg via INTRAVENOUS

## 2016-08-10 MED ORDER — IPRATROPIUM-ALBUTEROL 0.5-2.5 (3) MG/3ML IN SOLN
RESPIRATORY_TRACT | Status: AC
  Administered 2016-08-10: 6 mL via RESPIRATORY_TRACT
  Filled 2016-08-10: qty 6

## 2016-08-10 MED ORDER — ALBUTEROL SULFATE (2.5 MG/3ML) 0.083% IN NEBU: 5.0000 mg | INHALATION_SOLUTION | Freq: Once | RESPIRATORY_TRACT | Status: DC

## 2016-08-10 MED ORDER — PROPOFOL 1000 MG/100ML IV EMUL
5.0000 ug/kg/min | Freq: Once | INTRAVENOUS | Status: AC
  Administered 2016-08-10: 20 ug/kg/min via INTRAVENOUS

## 2016-08-10 MED ORDER — PROPOFOL 10 MG/ML IV BOLUS
25.0000 mg | Freq: Once | INTRAVENOUS | Status: AC
  Administered 2016-08-10: 25 mg via INTRAVENOUS

## 2016-08-10 MED ORDER — LORAZEPAM 2 MG/ML IJ SOLN
INTRAMUSCULAR | Status: AC
  Filled 2016-08-10: qty 1

## 2016-08-10 MED ORDER — ETOMIDATE 2 MG/ML IV SOLN
INTRAVENOUS | Status: AC | PRN
  Administered 2016-08-10: 20 mg via INTRAVENOUS

## 2016-08-10 MED ORDER — SODIUM CHLORIDE 0.9 % IV SOLN
INTRAVENOUS | Status: AC | PRN
  Administered 2016-08-10: 1000 mL via INTRAVENOUS

## 2016-08-10 MED ORDER — IPRATROPIUM-ALBUTEROL 0.5-2.5 (3) MG/3ML IN SOLN
6.0000 mL | Freq: Four times a day (QID) | RESPIRATORY_TRACT | Status: DC
  Administered 2016-08-10 – 2016-08-11 (×2): 6 mL via RESPIRATORY_TRACT
  Administered 2016-08-11 (×2): 3 mL via RESPIRATORY_TRACT
  Filled 2016-08-10 (×2): qty 3

## 2016-08-10 NOTE — ED Notes (Signed)
Calm at this time.  Tech sitting at the bedside to prevent injury.  Getting EKG at this time.
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
## 2016-08-10 NOTE — Procedures (Signed)
Intubation Procedure Note Courtney Terry [id number] 07/10/1961  Procedure: Intubation Indications: Airway protection and maintenance  Procedure Details Consent: Unable to obtain consent because of altered level of consciousness. Time Out: Verified patient identification, verified procedure, site/side was marked, verified correct patient position, special equipment/implants available, medications/allergies/relevent history reviewed, required imaging and test results available.  Performed  Maximum sterile technique was used including gloves.  Miller    Evaluation Hemodynamic Status: BP stable throughout; O2 sats: stable throughout Patient's Current Condition: stable Complications: No apparent complications Patient did tolerate procedure well. Chest X-ray ordered to verify placement.  CXR: pending.   Chriss Driver [facility] 08/10/2016

## 2016-08-10 NOTE — ED Triage Notes (Signed)
Brought via EMS from home with sudden onset SOB reported 20 minutes prior to arrival of ems.  Per ems became combative 5 min pta.  Given 1 Neb and solumedrol 125mg  IV en route.  O2 sat on RA initially reported at 81%.  Hx of COPD.

## 2016-08-11 ENCOUNTER — Inpatient Hospital Stay (HOSPITAL_COMMUNITY): Payer: Medicaid Other

## 2016-08-11 DIAGNOSIS — J441 Chronic obstructive pulmonary disease with (acute) exacerbation: Secondary | ICD-10-CM

## 2016-08-11 DIAGNOSIS — R4182 Altered mental status, unspecified: Secondary | ICD-10-CM

## 2016-08-11 LAB — CBC
HCT: 44.4 % (ref 36.0–46.0)
HCT: 44.4 % (ref 36.0–46.0)
HEMOGLOBIN: 13.8 g/dL (ref 12.0–15.0)
Hemoglobin: 13.9 g/dL (ref 12.0–15.0)
MCH: 26.4 pg (ref 26.0–34.0)
MCH: 26.7 pg (ref 26.0–34.0)
MCHC: 31.1 g/dL (ref 30.0–36.0)
MCHC: 31.3 g/dL (ref 30.0–36.0)
MCV: 84.9 fL (ref 78.0–100.0)
MCV: 85.2 fL (ref 78.0–100.0)
PLATELETS: 243 10*3/uL (ref 150–400)
PLATELETS: 250 10*3/uL (ref 150–400)
RBC: 5.21 MIL/uL — AB (ref 3.87–5.11)
RBC: 5.23 MIL/uL — AB (ref 3.87–5.11)
RDW: 16.5 % — AB (ref 11.5–15.5)
RDW: 16.5 % — ABNORMAL HIGH (ref 11.5–15.5)
WBC: 15.7 10*3/uL — AB (ref 4.0–10.5)
WBC: 16.3 10*3/uL — AB (ref 4.0–10.5)

## 2016-08-11 LAB — GLUCOSE, CAPILLARY
GLUCOSE-CAPILLARY: 129 mg/dL — AB (ref 65–99)
GLUCOSE-CAPILLARY: 128 mg/dL — AB (ref 65–99)
GLUCOSE-CAPILLARY: 118 mg/dL — AB (ref 65–99)
GLUCOSE-CAPILLARY: 103 mg/dL — AB (ref 65–99)
Glucose-Capillary: 126 mg/dL — ABNORMAL HIGH (ref 65–99)

## 2016-08-11 LAB — URINALYSIS, ROUTINE W REFLEX MICROSCOPIC
BILIRUBIN URINE: NEGATIVE
Glucose, UA: NEGATIVE mg/dL
HGB URINE DIPSTICK: NEGATIVE
KETONES UR: NEGATIVE mg/dL
Leukocytes, UA: NEGATIVE
NITRITE: NEGATIVE
PROTEIN: 30 mg/dL — AB
SPECIFIC GRAVITY, URINE: 1.018 (ref 1.005–1.030)
pH: 5.5 (ref 5.0–8.0)

## 2016-08-11 LAB — BASIC METABOLIC PANEL
ANION GAP: 9 (ref 5–15)
BUN: 14 mg/dL (ref 6–20)
CALCIUM: 8.7 mg/dL — AB (ref 8.9–10.3)
CO2: 31 mmol/L (ref 22–32)
Chloride: 100 mmol/L — ABNORMAL LOW (ref 101–111)
Creatinine, Ser: 0.94 mg/dL (ref 0.44–1.00)
GLUCOSE: 121 mg/dL — AB (ref 65–99)
POTASSIUM: 4.9 mmol/L (ref 3.5–5.1)
SODIUM: 140 mmol/L (ref 135–145)

## 2016-08-11 LAB — RAPID URINE DRUG SCREEN, HOSP PERFORMED
Amphetamines: NOT DETECTED
Barbiturates: NOT DETECTED
Benzodiazepines: NOT DETECTED
COCAINE: POSITIVE — AB
OPIATES: NOT DETECTED
TETRAHYDROCANNABINOL: NOT DETECTED

## 2016-08-11 LAB — BLOOD GAS, ARTERIAL
ACID-BASE EXCESS: 4.6 mmol/L — AB (ref 0.0–2.0)
Bicarbonate: 29.1 mmol/L — ABNORMAL HIGH (ref 20.0–28.0)
DRAWN BY: 35849
FIO2: 40
Mode: POSITIVE
O2 Saturation: 87.3 %
PCO2 ART: 47.6 mmHg (ref 32.0–48.0)
PEEP: 5 cmH2O
Patient temperature: 98.6
pH, Arterial: 7.403 (ref 7.350–7.450)
pO2, Arterial: 51.1 mmHg — ABNORMAL LOW (ref 83.0–108.0)

## 2016-08-11 LAB — PHOSPHORUS
PHOSPHORUS: 4.8 mg/dL — AB (ref 2.5–4.6)
PHOSPHORUS: 3 mg/dL (ref 2.5–4.6)

## 2016-08-11 LAB — CREATININE, SERUM
CREATININE: 0.92 mg/dL (ref 0.44–1.00)
GFR calc non Af Amer: >60 mL/min (ref >60)

## 2016-08-11 LAB — MAGNESIUM
MAGNESIUM: 2.2 mg/dL (ref 1.7–2.4)
Magnesium: 2 mg/dL (ref 1.7–2.4)

## 2016-08-11 LAB — TROPONIN I
TROPONIN I: 0.49 ng/mL — AB (ref <0.03)
Troponin I: 0.52 ng/mL (ref <0.03)
Troponin I: 0.36 ng/mL (ref <0.03)

## 2016-08-11 LAB — HEPARIN LEVEL (UNFRACTIONATED): HEPARIN UNFRACTIONATED: 0.42 [IU]/mL (ref 0.30–0.70)

## 2016-08-11 LAB — URINE MICROSCOPIC-ADD ON: RBC / HPF: NONE SEEN RBC/hpf (ref 0–5)

## 2016-08-11 LAB — MRSA PCR SCREENING: MRSA BY PCR: NEGATIVE

## 2016-08-11 LAB — TRIGLYCERIDES: TRIGLYCERIDES: 44 mg/dL (ref <150)

## 2016-08-11 MED ORDER — ALBUTEROL SULFATE (2.5 MG/3ML) 0.083% IN NEBU: 2.5000 mg | INHALATION_SOLUTION | RESPIRATORY_TRACT | Status: DC | PRN

## 2016-08-11 MED ORDER — ENOXAPARIN SODIUM 40 MG/0.4ML ~~LOC~~ SOLN
40.0000 mg | SUBCUTANEOUS | Status: DC
  Administered 2016-08-11: 40 mg via SUBCUTANEOUS
  Filled 2016-08-11: qty 0.4

## 2016-08-11 MED ORDER — LEVOTHYROXINE SODIUM 75 MCG PO TABS
75.0000 ug | ORAL_TABLET | Freq: Every day | ORAL | Status: DC
  Administered 2016-08-11 – 2016-08-15 (×5): 75 ug via ORAL
  Filled 2016-08-11 (×5): qty 1

## 2016-08-11 MED ORDER — ANASTROZOLE 1 MG PO TABS
1.0000 mg | ORAL_TABLET | Freq: Every day | ORAL | Status: DC
  Administered 2016-08-11 – 2016-08-15 (×5): 1 mg via ORAL
  Filled 2016-08-11 (×5): qty 1

## 2016-08-11 MED ORDER — SODIUM CHLORIDE 0.9 % IV SOLN: 250.0000 mL | INTRAVENOUS | Status: DC | PRN

## 2016-08-11 MED ORDER — DIVALPROEX SODIUM 500 MG PO DR TAB
500.0000 mg | DELAYED_RELEASE_TABLET | Freq: Two times a day (BID) | ORAL | Status: DC
  Administered 2016-08-11 – 2016-08-15 (×6): 500 mg via ORAL
  Filled 2016-08-11 (×10): qty 1

## 2016-08-11 MED ORDER — PRO-STAT SUGAR FREE PO LIQD
30.0000 mL | Freq: Two times a day (BID) | ORAL | Status: DC
  Administered 2016-08-11 – 2016-08-15 (×7): 30 mL
  Filled 2016-08-11 (×8): qty 30

## 2016-08-11 MED ORDER — METHYLPREDNISOLONE SODIUM SUCC 40 MG IJ SOLR
40.0000 mg | INTRAMUSCULAR | Status: DC
  Administered 2016-08-11 – 2016-08-12 (×2): 40 mg via INTRAVENOUS
  Filled 2016-08-11 (×3): qty 1

## 2016-08-11 MED ORDER — CHLORHEXIDINE GLUCONATE 0.12% ORAL RINSE (MEDLINE KIT)
15.0000 mL | Freq: Two times a day (BID) | OROMUCOSAL | Status: DC
  Administered 2016-08-11 – 2016-08-12 (×5): 15 mL via OROMUCOSAL

## 2016-08-11 MED ORDER — HEPARIN (PORCINE) IN NACL 100-0.45 UNIT/ML-% IJ SOLN
1200.0000 [IU]/h | INTRAMUSCULAR | Status: DC
  Administered 2016-08-11: 650 [IU]/h via INTRAVENOUS
  Administered 2016-08-12: 750 [IU]/h via INTRAVENOUS
  Administered 2016-08-14 (×2): 1200 [IU]/h via INTRAVENOUS
  Filled 2016-08-11 (×5): qty 250

## 2016-08-11 MED ORDER — ORAL CARE MOUTH RINSE
15.0000 mL | Freq: Four times a day (QID) | OROMUCOSAL | Status: DC
  Administered 2016-08-11 – 2016-08-12 (×5): 15 mL via OROMUCOSAL

## 2016-08-11 MED ORDER — FAMOTIDINE 20 MG PO TABS
20.0000 mg | ORAL_TABLET | Freq: Two times a day (BID) | ORAL | Status: DC
  Administered 2016-08-11 – 2016-08-15 (×10): 20 mg via ORAL
  Filled 2016-08-11 (×12): qty 1

## 2016-08-11 MED ORDER — OSELTAMIVIR PHOSPHATE 75 MG PO CAPS
75.0000 mg | ORAL_CAPSULE | Freq: Two times a day (BID) | ORAL | Status: DC
  Administered 2016-08-11 – 2016-08-12 (×4): 75 mg via ORAL
  Filled 2016-08-11 (×5): qty 1

## 2016-08-11 MED ORDER — INFLUENZA VAC SPLIT QUAD 0.5 ML IM SUSY
0.5000 mL | PREFILLED_SYRINGE | INTRAMUSCULAR | Status: AC
  Administered 2016-08-12: 0.5 mL via INTRAMUSCULAR
  Filled 2016-08-11: qty 0.5

## 2016-08-11 MED ORDER — ENOXAPARIN SODIUM 30 MG/0.3ML ~~LOC~~ SOLN
25.0000 mg | SUBCUTANEOUS | Status: DC
  Filled 2016-08-11: qty 0.25

## 2016-08-11 MED ORDER — IPRATROPIUM-ALBUTEROL 0.5-2.5 (3) MG/3ML IN SOLN
3.0000 mL | Freq: Four times a day (QID) | RESPIRATORY_TRACT | Status: DC
  Administered 2016-08-11 – 2016-08-13 (×7): 3 mL via RESPIRATORY_TRACT
  Filled 2016-08-11 (×7): qty 3

## 2016-08-11 MED ORDER — VITAL HIGH PROTEIN PO LIQD
1000.0000 mL | ORAL | Status: DC
  Administered 2016-08-11: 1000 mL

## 2016-08-11 MED ORDER — DOXYCYCLINE HYCLATE 100 MG IV SOLR
100.0000 mg | Freq: Two times a day (BID) | INTRAVENOUS | Status: DC
  Administered 2016-08-11 – 2016-08-13 (×5): 100 mg via INTRAVENOUS
  Filled 2016-08-11 (×6): qty 100

## 2016-08-11 MED ORDER — PROPOFOL 1000 MG/100ML IV EMUL
5.0000 ug/kg/min | INTRAVENOUS | Status: DC
  Administered 2016-08-11: 35 ug/kg/min via INTRAVENOUS
  Administered 2016-08-11: 60 ug/kg/min via INTRAVENOUS
  Administered 2016-08-11 (×2): 50 ug/kg/min via INTRAVENOUS
  Administered 2016-08-12 (×2): 60 ug/kg/min via INTRAVENOUS
  Filled 2016-08-11 (×5): qty 100

## 2016-08-11 MED ORDER — ASPIRIN 81 MG PO CHEW
81.0000 mg | CHEWABLE_TABLET | Freq: Every day | ORAL | Status: DC
  Administered 2016-08-11 – 2016-08-15 (×5): 81 mg via ORAL
  Filled 2016-08-11 (×5): qty 1

## 2016-08-11 MED ORDER — PROPOFOL 1000 MG/100ML IV EMUL
INTRAVENOUS | Status: AC
  Filled 2016-08-11: qty 100

## 2016-08-11 NOTE — Progress Notes (Signed)
Brief Nutrition Note  Consult received for enteral/tube feeding initiation and management.  PEPUP Adult Enteral Nutrition Protocol initiated. Full assessment to follow.  Admitting Dx: COPD exacerbation ([place]) [J44.1] Encounter for intubation [Z01.818] Acute on chronic respiratory failure with hypoxia and hypercapnia ([place]) [J96.21, J96.22]  Body mass index is 19.98 kg/m. Pt meets criteria for normal range based on current BMI.  Labs:   Recent Labs Lab 08/10/16 2005 08/11/16 0256  NA 137 140  K 4.9 4.9  CL 100* 100*  CO2 24 31  BUN 10 14  CREATININE 1.03* 0.94  0.92  CALCIUM 9.1 8.7*  MG  --  2.2  PHOS  --  4.8*  GLUCOSE 144* 121*    Clayton Bibles, MS, RD, LDN Pager: [phone] After Hours Pager: [phone]

## 2016-08-11 NOTE — Progress Notes (Signed)
     Recent Labs Lab 08/11/16 0950  TROPONINI 0.52*    EKG - appear changed since yesteray  A) ? NSTEMI  PL Dc sq lovenox Cycle trop Get echo Start aspirin daily  Start IV heparin gtt  Dr. Brand Males, M.D., [facility].C.P Pulmonary and Critical Care Medicine Staff Physician [place] [facility] Pager: [phone], If no answer or between  15:00h - 7:00h: call [phone]  08/11/2016 1:55 PM

## 2016-08-11 NOTE — H&P (Signed)
PULMONARY / CRITICAL CARE MEDICINE   Name: Courtney Terry MRN: [id number] DOB: July 07, 1961    ADMISSION DATE:  08/10/2016 CONSULTATION DATE:  08/11/16  REFERRING MD:  Dr Sela Hua  CHIEF COMPLAINT:  Resp failure  HISTORY OF PRESENT ILLNESS:   Courtney Terry is a 55 y.o. female with severe COPD (FEV 23% predicted in 2014) who presented to the ED in respiratory distress. The patient was on propofol and no family was available to provide history. The following is from documentation in the chart:  "Patient presents to the ED via EMS with sudden onset of acute COPD exacerbation. No other history provided by family other than she was doing fine earlier today.   Patient combative and hypoxic initially at 81% via EMS. Given solumedrol, 1 neb and placed on CPAP.   Patient placed on bipap here with two duonebs given via neb. Will obtain VBG. Combative and would not tolerate bipap but given 1 mg of Ativan with now ability to use bipap.  Gas shows ph 7.00 with CO2>100. Patient now somnolent more than likely due to narcosis.   She was intubated and ICU was called for admission.   PAST MEDICAL HISTORY :  She  has a past medical history of Anemia; Anxiety; Arthritis; Asthma; Bipolar 1 disorder ([place] [address]); Breast cancer ([place]) (01/2012); Cancer of right breast [facility]) (03/2015); COPD (chronic obstructive pulmonary disease) ([place]); Depression; Epilepsy ([place]); Hallucination; Hypothyroidism; PTSD (post-traumatic stress disorder); Schizophrenia ([place]); Seizures ([place]); and Shortness of breath dyspnea.  PAST SURGICAL HISTORY: She  has a past surgical history that includes Patella fracture surgery (Right, 1993); Tonsillectomy; Ovarian cyst surgery; Breast lumpectomy with needle localization and axillary sentinel lymph node bx (03/06/2012); Hernia repair (Left); Mastectomy complete / simple (Right, 03/14/2015); Breast reconstruction with placement of tissue expander and flex hd (acellular hydrated dermis) (Right); Nipple  sparing mastectomy (Right, 03/14/2015); Breast reconstruction with placement of tissue expander and flex hd (acellular hydrated dermis) (Right, 03/14/2015); Removal of bilateral tissue expanders with placement of bilateral breast implants (06/06/2015); Removal of tissue expander and placement of implant (Right, 06/06/2015); Breast implant removal (Right, 06/19/2015); Breast implant removal (Left, 06/20/2015); Placement of breast implants (Bilateral, 10/20/2015); Breast biopsy (Right, 02/2012); Breast lumpectomy (Right, 02/2012); Breast biopsy (Right, 02/2015); Placement of breast implants (Bilateral, 10/20/2015); Placement of breast implants (11/07/2015); Breast implant removal (Right, 11/07/2015); Latissimus flap to breast (Right, 03/08/2016); and Tissue expander placement (Right, 03/08/2016).  Allergies  Allergen Reactions  . Levaquin [Levofloxacin] Hives    No current facility-administered medications on file prior to encounter.    Current Outpatient Prescriptions on File Prior to Encounter  Medication Sig  . albuterol (PROAIR HFA) 108 (90 Base) MCG/ACT inhaler Up to 2pffs every 4 hours if can't catch your breath  . albuterol (PROVENTIL) (2.5 MG/3ML) 0.083% nebulizer solution TAKE 3 MLS BY NEBULIZATION EVERY 4 HOURS AS NEEDED FOR WHEEZING OR SHORTNESS OF BREATH  . anastrozole (ARIMIDEX) 1 MG tablet TAKE 1 TABLET BY MOUTH DAILY  . divalproex (DEPAKOTE) 500 MG DR tablet Take 500 mg by mouth 2 (two) times daily.   [name] Kitchen ENSURE PLUS (ENSURE PLUS) LIQD Take 1 Can by mouth 2 (two) times daily between meals. (Patient not taking: Reported on 06/10/2016)  . Glycopyrrolate-Formoterol (BEVESPI AEROSPHERE) 9-4.8 MCG/ACT AERO Inhale 2 puffs into the lungs 2 (two) times daily.  [name] Kitchen levothyroxine (SYNTHROID, LEVOTHROID) 75 MCG tablet Take 75 mcg by mouth daily.  . OXYGEN Inhale 2 L into the lungs continuous.    FAMILY HISTORY:  Her indicated that  her mother is deceased. She indicated that her father is deceased. She indicated that  only one of her two sisters is alive. She indicated that only one of her two brothers is alive. She indicated that her other is alive.    SOCIAL HISTORY: She  reports that she has been smoking Cigarettes.  She has a 37.00 pack-year smoking history. She has never used smokeless tobacco. She reports that she uses drugs, including "Crack" cocaine and Cocaine. She reports that she does not drink alcohol.  REVIEW OF SYSTEMS:   Unable to be obtained due to [place]: BP 92/66   Pulse 103   Temp 98 F (36.7 C) (Axillary)   Resp 13   Ht 5\' 4"  (1.626 m)   Wt 49 kg (108 lb)   SpO2 95%   BMI 18.54 kg/m   VENTILATOR SETTINGS: Vent Mode: PRVC FiO2 (%):  [40 %-50 %] 40 % Set Rate:  [10 bmp] 10 bmp Vt Set:  [500 mL] 500 mL PEEP:  [5 cmH20] 5 cmH20  INTAKE / OUTPUT: No intake/output data recorded.  PHYSICAL EXAMINATION: General:  Sedated on propofol, comfortable Neuro:  Sedated, pupils 45mm and reactive equally bilaterally HEENT:  MMM, ETT in place Cardiovascular:  RRR, no murmurs rubs or gallops Lungs:  CTAB but diminished breath sounds in all lung fields Abdomen:  Soft, NT, ND Musculoskeletal:  No deformities Skin:  Bald, no apparent rashes  LABS:  BMET  Recent Labs Lab 08/10/16 2005  NA 137  K 4.9  CL 100*  CO2 24  BUN 10  CREATININE 1.03*  GLUCOSE 144*    Electrolytes  Recent Labs Lab 08/10/16 2005  CALCIUM 9.1    CBC  Recent Labs Lab 08/10/16 2005  WBC 16.8*  HGB 15.0  HCT 47.0*  PLT 274    Coag's No results for input(s): APTT, INR in the last 168 hours.  Sepsis Markers No results for input(s): LATICACIDVEN, PROCALCITON, O2SATVEN in the last 168 hours.  ABG  Recent Labs Lab 08/10/16 2348  PHART 7.249*  PCO2ART 68.5*  PO2ART 84.0    Liver Enzymes No results for input(s): AST, ALT, ALKPHOS, BILITOT, ALBUMIN in the last 168 hours.  Cardiac Enzymes No results for input(s): TROPONINI, PROBNP in the last 168 hours.  Glucose No  results for input(s): GLUCAP in the last 168 hours.  Imaging Dg Chest Portable 1 View  Result Date: 08/10/2016 CLINICAL DATA:  Endotracheal tube placement.  Initial encounter. EXAM: PORTABLE CHEST 1 VIEW COMPARISON:  Chest radiograph performed earlier today at 8:16 p.m. FINDINGS: The patient's endotracheal tube is seen ending 6-7 cm above the carina. This could be advanced 3 cm. The enteric tube is noted extending below the diaphragm. The lungs are hyperexpanded, with flattening of the hemidiaphragms, compatible with COPD. There is no evidence of focal opacification, pleural effusion or pneumothorax. The cardiomediastinal silhouette is within normal limits. No acute osseous abnormalities are seen. IMPRESSION: 1. Endotracheal tube seen ending 6-7 cm above the carina. This could be advanced 3 cm. 2. Findings of COPD.  Lungs otherwise grossly clear. Electronically Signed   By: Garald Balding M.D.   On: 08/10/2016 21:16   Dg Chest Port 1 View  Result Date: 08/10/2016 CLINICAL DATA:  55 year old female with a history of shortness of breath EXAM: PORTABLE CHEST 1 VIEW COMPARISON:  Multiple prior chest x-ray most recent 02/25/2016, 07/01/2014 FINDINGS: Cardiomediastinal silhouette unchanged in size and contour. Changes of emphysema with hyper expansion. Surgical changes of the  right axilla, with interval placement of a right chest wall tissue expander. Changes of prior left-sided breast reconstruction. No confluent airspace disease or pneumothorax. No pleural effusion. Chronic lung changes again noted. IMPRESSION: Chronic lung changes with emphysema and no evidence of superimposed acute cardiopulmonary disease. Interval placement of tissue expander on the right chest wall with surgical changes of the right axilla. Signed, Dulcy Fanny. Earleen Newport, DO [facility] Interventional Radiology Specialists [facility] Radiology Electronically Signed   By: Corrie Mckusick D.O.   On: 08/10/2016 20:49    DISCUSSION: Courtney Terry is a 55 y.o. female with severe COPD who presented with respiratory distress. She is now intubated with respiratory failure presumptively from COPD exacerbation. She is at risk due to her very low baseline FEV1. No infiltrates seen on CXR and low suspicion for PE currently. She had issues with keeping her oxygen on at home in previous ED visit so compliance /access to meds may be a factor.  ASSESSMENT / PLAN:  PULMONARY A: Acute COPD exacerbation with resp failure  P:   Continue mechanical ventilation Solumedrol Duonebs   CARDIOVASCULAR A:  No issues P:  Telemetry  RENAL A:   AKI - baseline Cr 0.6, now 1.03 P:   1000L bolus given. Recheck in morning  GASTROINTESTINAL A:   No issues P:   NPO  HEMATOLOGIC A:   No issues P:  DVT ppx enoxaparin  INFECTIOUS A:   No issues P:   Monitor for infection  ENDOCRINE A:   Hypothyroidism P:   Synthroid  NEUROLOGIC A:   AMS likely from hypercarbia P:   RASS goal: -1 to 0  Fentanyl and versed Propofol if BP tolerates   FAMILY  - Updates: no family here  - Inter-disciplinary family meet or Palliative Care meeting due by:  08/17/16  Critical Care 30 mins spent  Beverely Low  Pulmonary and [place] Pager: [phone]  08/11/2016, 1:00 AM
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
## 2016-08-11 NOTE — ED Notes (Signed)
Appears more comfortable at this time.  Fighting vent less frequently.  ABG results called to admitting.  No further orders received.  No change made to vent settings at this time.  Family called earlier in shift and asked if they could visit patient.  Encouraged to come.  Have not heard from anyone since.  Soft wrist restraints remain in place.  Good circulation noted.

## 2016-08-11 NOTE — Plan of Care (Signed)
Problem: Education: Goal: Knowledge of [facility] Education information/materials will improve Outcome: Not Progressing UTA/Pt sedated, intubated  Problem: Safety: Goal: Ability to remain free from injury will improve Outcome: Not Progressing Bilateral restraints

## 2016-08-11 NOTE — Progress Notes (Signed)
RT note- ETT advanced jper MD order, to 26cm, with follow up xray.

## 2016-08-11 NOTE — Progress Notes (Signed)
  Notified MD that tropinin was .52. MD ordered EKG. Called MD to report the EKG was done. MD reviewed the EKG. See new orders

## 2016-08-11 NOTE — Progress Notes (Addendum)
PULMONARY / CRITICAL CARE MEDICINE   Name: Courtney Terry MRN: [id number] DOB: 10/23/1960    ADMISSION DATE:  08/10/2016 CONSULTATION DATE:  08/11/16  REFERRING MD:  Dr Sela Hua  CHIEF COMPLAINT:  Resp failure  Courtney Terry is a 55 y.o. female with severe COPD (FEV 23% predicted in 2014) who presented to the ED in respiratory distress. The patient was on propofol and no family was available to provide history. The following is from documentation in the chart:  "Patient presents to the ED via EMS with sudden onset of acute COPD exacerbation. No other history provided by family other than she was doing fine earlier today.   Patient combative and hypoxic initially at 81% via EMS. Given solumedrol, 1 neb and placed on CPAP.   Patient placed on bipap here with two duonebs given via neb. Will obtain VBG. Combative and would not tolerate bipap but given 1 mg of Ativan with now ability to use bipap.  Gas shows ph 7.00 with CO2>100. Patient now somnolent more than likely due to narcosis.   She was intubated and ICU was called for admission.   SUBJECTIVE/OVERNIGHT/INTERVAL HX 08/11/16: combatyiveon WUA off diprivan perRN. But now doing SBT with deepsedation. URINE TOX COCAINE   VITAL SIGNS: BP 121/84   Pulse 95   Temp 98.9 F (37.2 C) (Oral)   Resp (!) 21   Ht 5\' 4"  (1.626 m)   Wt 52.8 kg (116 lb 6.5 oz)   SpO2 99%   BMI 19.98 kg/m   VENTILATOR SETTINGS: Vent Mode: PSV;CPAP FiO2 (%):  [40 %-100 %] 40 % Set Rate:  [10 bmp] 10 bmp Vt Set:  [500 mL] 500 mL PEEP:  [5 cmH20] 5 cmH20 Pressure Support:  [10 cmH20] 10 cmH20 Plateau Pressure:  [13 cmH20] 13 cmH20  INTAKE / OUTPUT: I/O last 3 completed shifts: In: 1162 [I.V.:1162] Out: 775 [Urine:575; Emesis/NG output:200]  PHYSICAL EXAMINATION: General: Sedated on propofol, comfortable Neuro:  RASS -3 on diprivan HEENT:  MMM, ETT in place Cardiovascular:  RRR, no murmurs rubs or gallops Lungs:  CTAB but diminished  breath sounds in all lung fields. BARRELL CHEST Abdomen:  Soft, NT, ND Musculoskeletal:  No deformities Skin:  Bald, no apparent rashes  LABS: PULMONARY  Recent Labs Lab 08/10/16 2348  PHART 7.249*  PCO2ART 68.5*  PO2ART 84.0  HCO3 30.1*  TCO2 32  O2SAT 94.0    CBC  Recent Labs Lab 08/10/16 2005 08/11/16 0256  HGB 15.0 13.9  13.8  HCT 47.0* 44.4  44.4  WBC 16.8* 15.7*  16.3*  PLT 274 243  250    COAGULATION No results for input(s): INR in the last 168 hours.  CARDIAC  No results for input(s): TROPONINI in the last 168 hours. No results for input(s): PROBNP in the last 168 hours.   CHEMISTRY  Recent Labs Lab 08/10/16 2005 08/11/16 0256  NA 137 140  K 4.9 4.9  CL 100* 100*  CO2 24 31  GLUCOSE 144* 121*  BUN 10 14  CREATININE 1.03* 0.94  0.92  CALCIUM 9.1 8.7*  MG  --  2.2  PHOS  --  4.8*   Estimated Creatinine Clearance: 58.3 mL/min (by C-G formula based on SCr of 0.92 mg/dL).   LIVER No results for input(s): AST, ALT, ALKPHOS, BILITOT, PROT, ALBUMIN, INR in the last 168 hours.   INFECTIOUS No results for input(s): LATICACIDVEN, PROCALCITON in the last 168 hours.   ENDOCRINE CBG (last 3)   Recent Labs  08/11/16 0414 08/11/16 0731  GLUCAP 129* 128*         IMAGING x48h  - image(s) personally visualized  -   highlighted in bold Dg Chest Portable 1 View  Result Date: 08/10/2016 CLINICAL DATA:  Endotracheal tube placement.  Initial encounter. EXAM: PORTABLE CHEST 1 VIEW COMPARISON:  Chest radiograph performed earlier today at 8:16 p.m. FINDINGS: The patient's endotracheal tube is seen ending 6-7 cm above the carina. This could be advanced 3 cm. The enteric tube is noted extending below the diaphragm. The lungs are hyperexpanded, with flattening of the hemidiaphragms, compatible with COPD. There is no evidence of focal opacification, pleural effusion or pneumothorax. The cardiomediastinal silhouette is within normal limits. No acute  osseous abnormalities are seen. IMPRESSION: 1. Endotracheal tube seen ending 6-7 cm above the carina. This could be advanced 3 cm. 2. Findings of COPD.  Lungs otherwise grossly clear. Electronically Signed   By: Garald Balding M.D.   On: 08/10/2016 21:16   Dg Chest Port 1 View  Result Date: 08/10/2016 CLINICAL DATA:  55 year old female with a history of shortness of breath EXAM: PORTABLE CHEST 1 VIEW COMPARISON:  Multiple prior chest x-ray most recent 02/25/2016, 07/01/2014 FINDINGS: Cardiomediastinal silhouette unchanged in size and contour. Changes of emphysema with hyper expansion. Surgical changes of the right axilla, with interval placement of a right chest wall tissue expander. Changes of prior left-sided breast reconstruction. No confluent airspace disease or pneumothorax. No pleural effusion. Chronic lung changes again noted. IMPRESSION: Chronic lung changes with emphysema and no evidence of superimposed acute cardiopulmonary disease. Interval placement of tissue expander on the right chest wall with surgical changes of the right axilla. Signed, Dulcy Fanny. Earleen Newport, DO Vascular and Interventional Radiology Specialists [facility] Radiology Electronically Signed   By: Corrie Mckusick D.O.   On: 08/10/2016 20:49       ASSESSMENT / PLAN:  PULMONARY A: Acute COPD exacerbation with resp failure . ? Virus v cocaine v both   -combative on wua will preclude extubaion  P:   Continue mechanical ventilation - permissive huprecapnia Solumedrol Duonebs Abx/Antiviral - see ID   CARDIOVASCULAR A:  No issues P:  Telemetry  RENAL  Recent Labs Lab 08/10/16 2005 08/11/16 0256  CREATININE 1.03* 0.94  0.92     A:   AKI - baseline Cr 0.6, admit 1.03    - improved  P:   maintnanece fluids  GASTROINTESTINAL A:   No issues P:   NPO Start TF  HEMATOLOGIC A:   No issues P:  DVT ppx enoxaparin  INFECTIOUS A:   Rule out viral infection  P:   Monitor for infection RVP and  empiric tamiflu toill results back Empric dox Anti-infectives    Start     Dose/Rate Route Frequency Ordered Stop   08/11/16 1000  oseltamivir (TAMIFLU) capsule 75 mg     75 mg Oral 2 times daily 08/11/16 0920 08/16/16 0959   08/11/16 0945  doxycycline (VIBRAMYCIN) 100 mg in dextrose 5 % 250 mL IVPB     100 mg 125 mL/hr over 120 Minutes Intravenous Every 12 hours 08/11/16 0930         ENDOCRINE A:   Hypothyroidism P:   Synthroid  NEUROLOGIC A:   AMS likely from hypercarbia   - reports of agitation on wua 08/11/16  P:   RASS goal: -1 to 0  Fentanyl and versed Propofol gtt  FAMILY  - Updates: no family here 08/11/16  - Inter-disciplinary family meet or  Palliative Care meeting due by:  08/17/16    The patient is critically ill with multiple organ systems failure and requires high complexity decision making for assessment and support, frequent evaluation and titration of therapies, application of advanced monitoring technologies and extensive interpretation of multiple databases.   Critical Care Time devoted to patient care services described in this note is  30  Minutes. This time reflects time of care of this signee Dr Brand Males. This critical care time does not reflect procedure time, or teaching time or supervisory time of PA/NP/Med student/Med Resident etc but could involve care discussion time    Dr. Brand Males, M.D., [facility].C.P Pulmonary and Critical Care Medicine Staff Physician [place] [facility] and Critical [facility] Pager: [phone], If no answer or between  15:00h - 7:00h: call [phone]  08/11/2016 9:17 AM

## 2016-08-11 NOTE — Progress Notes (Signed)
[facility] Progress Note Patient Name: Courtney Terry DOB: 11-20-1960 MRN: [id number]   Date of Service  08/11/2016  HPI/Events of Note  Request to renew restraint orders.   eICU Interventions  Will renew restraint orders.      Intervention Category Minor Interventions: Agitation / anxiety - evaluation and management  Sommer,Steven Eugene 08/11/2016, 10:22 PM

## 2016-08-11 NOTE — Progress Notes (Addendum)
[place] Progress Note Patient Name: Courtney Terry DOB: 31-May-1961 MRN: [id number]   Date of Service  08/11/2016  HPI/Events of Note  Troponin = 0.52 >>  0.49. Already on ASA and a Heparin IV infusion.   eICU Interventions  Continue present management.      Intervention Category Intermediate Interventions: Diagnostic test evaluation  Mary-Anne Polizzi Cornelia Copa 08/11/2016, 8:28 PM
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
## 2016-08-12 ENCOUNTER — Inpatient Hospital Stay (HOSPITAL_COMMUNITY): Payer: Medicaid Other

## 2016-08-12 ENCOUNTER — Other Ambulatory Visit (HOSPITAL_COMMUNITY): Payer: Self-pay

## 2016-08-12 DIAGNOSIS — J9612 Chronic respiratory failure with hypercapnia: Secondary | ICD-10-CM

## 2016-08-12 LAB — HEPATIC FUNCTION PANEL
ALBUMIN: 3.2 g/dL — AB (ref 3.5–5.0)
ALT: 18 U/L (ref 14–54)
AST: 29 U/L (ref 15–41)
Alkaline Phosphatase: 87 U/L (ref 38–126)
BILIRUBIN TOTAL: 0.2 mg/dL — AB (ref 0.3–1.2)
Total Protein: 6.2 g/dL — ABNORMAL LOW (ref 6.5–8.1)

## 2016-08-12 LAB — GLUCOSE, CAPILLARY
GLUCOSE-CAPILLARY: 100 mg/dL — AB (ref 65–99)
GLUCOSE-CAPILLARY: 104 mg/dL — AB (ref 65–99)
GLUCOSE-CAPILLARY: 141 mg/dL — AB (ref 65–99)
GLUCOSE-CAPILLARY: 148 mg/dL — AB (ref 65–99)
GLUCOSE-CAPILLARY: 67 mg/dL (ref 65–99)
Glucose-Capillary: 135 mg/dL — ABNORMAL HIGH (ref 65–99)
Glucose-Capillary: 107 mg/dL — ABNORMAL HIGH (ref 65–99)

## 2016-08-12 LAB — CBC WITH DIFFERENTIAL/PLATELET
BASOS PCT: 0 %
Basophils Absolute: 0 10*3/uL (ref 0.0–0.1)
EOS ABS: 0 10*3/uL (ref 0.0–0.7)
Eosinophils Relative: 0 %
HEMATOCRIT: 42.6 % (ref 36.0–46.0)
Hemoglobin: 13.4 g/dL (ref 12.0–15.0)
Lymphocytes Relative: 12 %
Lymphs Abs: 2 10*3/uL (ref 0.7–4.0)
MCH: 26.3 pg (ref 26.0–34.0)
MCHC: 31.5 g/dL (ref 30.0–36.0)
MCV: 83.7 fL (ref 78.0–100.0)
MONO ABS: 1.5 10*3/uL — AB (ref 0.1–1.0)
MONOS PCT: 9 %
Neutro Abs: 12.7 10*3/uL — ABNORMAL HIGH (ref 1.7–7.7)
Neutrophils Relative %: 79 %
Platelets: 222 10*3/uL (ref 150–400)
RBC: 5.09 MIL/uL (ref 3.87–5.11)
RDW: 16.5 % — AB (ref 11.5–15.5)
WBC: 16.3 10*3/uL — ABNORMAL HIGH (ref 4.0–10.5)

## 2016-08-12 LAB — BASIC METABOLIC PANEL
Anion gap: 8 (ref 5–15)
Anion gap: 10 (ref 5–15)
BUN: 19 mg/dL (ref 6–20)
BUN: 19 mg/dL (ref 6–20)
CALCIUM: 9.1 mg/dL (ref 8.9–10.3)
CHLORIDE: 100 mmol/L — AB (ref 101–111)
CO2: 31 mmol/L (ref 22–32)
CO2: 28 mmol/L (ref 22–32)
CREATININE: 0.62 mg/dL (ref 0.44–1.00)
Calcium: 9 mg/dL (ref 8.9–10.3)
Chloride: 99 mmol/L — ABNORMAL LOW (ref 101–111)
Creatinine, Ser: 0.74 mg/dL (ref 0.44–1.00)
GFR calc Af Amer: >60 mL/min (ref >60)
GFR calc non Af Amer: >60 mL/min (ref >60)
GFR calc non Af Amer: >60 mL/min (ref >60)
GLUCOSE: 76 mg/dL (ref 65–99)
Glucose, Bld: 94 mg/dL (ref 65–99)
POTASSIUM: 4.4 mmol/L (ref 3.5–5.1)
Potassium: 4.4 mmol/L (ref 3.5–5.1)
Sodium: 138 mmol/L (ref 135–145)
Sodium: 138 mmol/L (ref 135–145)

## 2016-08-12 LAB — MAGNESIUM: MAGNESIUM: 2.1 mg/dL (ref 1.7–2.4)

## 2016-08-12 LAB — RESPIRATORY PANEL BY PCR
ADENOVIRUS-RVPPCR: NOT DETECTED
Bordetella pertussis: NOT DETECTED
CORONAVIRUS HKU1-RVPPCR: NOT DETECTED
CORONAVIRUS NL63-RVPPCR: NOT DETECTED
CORONAVIRUS OC43-RVPPCR: NOT DETECTED
Chlamydophila pneumoniae: NOT DETECTED
Coronavirus 229E: NOT DETECTED
INFLUENZA A-RVPPCR: NOT DETECTED
Influenza B: NOT DETECTED
MYCOPLASMA PNEUMONIAE-RVPPCR: NOT DETECTED
Metapneumovirus: NOT DETECTED
PARAINFLUENZA VIRUS 1-RVPPCR: NOT DETECTED
PARAINFLUENZA VIRUS 4-RVPPCR: NOT DETECTED
Parainfluenza Virus 2: NOT DETECTED
Parainfluenza Virus 3: NOT DETECTED
Respiratory Syncytial Virus: NOT DETECTED
Rhinovirus / Enterovirus: NOT DETECTED

## 2016-08-12 LAB — TROPONIN I: Troponin I: 0.25 ng/mL (ref <0.03)

## 2016-08-12 LAB — POCT I-STAT 3, ART BLOOD GAS (G3+)
ACID-BASE EXCESS: 2 mmol/L (ref 0.0–2.0)
ACID-BASE EXCESS: 7 mmol/L — AB (ref 0.0–2.0)
BICARBONATE: 35 mmol/L — AB (ref 20.0–28.0)
Bicarbonate: 29.1 mmol/L — ABNORMAL HIGH (ref 20.0–28.0)
O2 Saturation: 84 %
O2 Saturation: 96 %
PCO2 ART: 54.4 mmHg — AB (ref 32.0–48.0)
PH ART: 7.336 — AB (ref 7.350–7.450)
PO2 ART: 53 mmHg — AB (ref 83.0–108.0)
Patient temperature: 98.6
TCO2: 31 mmol/L (ref 0–100)
TCO2: 37 mmol/L (ref 0–100)
pCO2 arterial: 63 mmHg — ABNORMAL HIGH (ref 32.0–48.0)
pH, Arterial: 7.352 (ref 7.350–7.450)
pO2, Arterial: 91 mmHg (ref 83.0–108.0)

## 2016-08-12 LAB — TRIGLYCERIDES: TRIGLYCERIDES: 35 mg/dL (ref <150)

## 2016-08-12 LAB — HEPARIN LEVEL (UNFRACTIONATED): HEPARIN UNFRACTIONATED: 0.24 [IU]/mL — AB (ref 0.30–0.70)

## 2016-08-12 LAB — PHOSPHORUS: Phosphorus: 3.8 mg/dL (ref 2.5–4.6)

## 2016-08-12 MED ORDER — ORAL CARE MOUTH RINSE
15.0000 mL | Freq: Two times a day (BID) | OROMUCOSAL | Status: DC
  Administered 2016-08-13 – 2016-08-15 (×3): 15 mL via OROMUCOSAL

## 2016-08-12 NOTE — Progress Notes (Signed)
Received patient from 40M.  Patient alert and oriented with cigarettes present.  Patient refused to give them to staff.  Patient with telemetry started and heparin drip infusing at 7.5cc/hr.  Oriented to the unit.

## 2016-08-12 NOTE — Progress Notes (Signed)
ANTICOAGULATION CONSULT NOTE - Follow-up Consult  Pharmacy Consult for heparin Indication: chest pain/ACS  Allergies  Allergen Reactions  . Levaquin [Levofloxacin] Hives    Patient Measurements: Height: 5\' 4"  (162.6 cm) Weight: 115 lb 1.3 oz (52.2 kg) IBW/kg (Calculated) : 54.7 Heparin Dosing Weight: 52.8 kg  Vital Signs: Temp: 98.6 F (37 C) (11/06 0427) Temp Source: Oral (11/06 0427) BP: 88/63 (11/06 0300) Pulse Rate: 88 (11/06 0300)  Labs:  Recent Labs  08/10/16 2005 08/11/16 0256 08/11/16 0950 08/11/16 1531 08/11/16 2004 08/11/16 2131 08/12/16 0447  HGB 15.0 13.9  13.8  --   --   --   --  13.4  HCT 47.0* 44.4  44.4  --   --   --   --  42.6  PLT 274 243  250  --   --   --   --  222  HEPARINUNFRC  --   --   --   --  0.42  --  0.24*  CREATININE 1.03* 0.94  0.92  --   --   --   --   --   TROPONINI  --   --  0.52* 0.49*  --  0.36*  --     Estimated Creatinine Clearance: 57.6 mL/min (by C-G formula based on SCr of 0.92 mg/dL).  Assessment: 55 yo F admitted 08/10/2016 in respiratory distress secondary to COPD exacerbation. Patient was hypoxic and combative with EMS. She required intubation in the ED and was admitted to the ICU. Troponin trending down from 0.52. Heparin level down to subtherapeutic (0.24) on gtt at 650 units/hr. No issues with line or bleeding reported per RN. CBC stable.   Goal of Therapy:  Heparin level 0.3-0.7 units/ml Monitor platelets by anticoagulation protocol: Yes   Plan:  - Increase heparin to 750 units/hr - Will f/u 6 hr heparin level  Sherlon Handing, PharmD, BCPS Clinical pharmacist, pager [phone] 08/12/2016,5:44 AM

## 2016-08-12 NOTE — Progress Notes (Signed)
Patient refusing blood draws. Will notify MD.

## 2016-08-12 NOTE — Progress Notes (Signed)
PULMONARY / CRITICAL CARE MEDICINE   Name: Courtney Terry MRN: [id number] DOB: 22-Mar-1961    ADMISSION DATE:  08/10/2016 CONSULTATION DATE:  08/11/16  REFERRING MD:  Dr Sela Hua  CHIEF COMPLAINT:  Resp failure  BRIEF Courtney Terry is a 55 y.o. female with severe COPD (FEV 23% predicted in 2014) who presented to the ED in respiratory distress. The patient was on propofol and no family was available to provide history. The following is from documentation in the chart:  "Patient presents to the ED via EMS with sudden onset of acute COPD exacerbation. No other history provided by family other than she was doing fine earlier today.   Patient combative and hypoxic initially at 81% via EMS. Given solumedrol, 1 neb and placed on CPAP.   Patient placed on bipap here with two duonebs given via neb. Will obtain VBG. Combative and would not tolerate bipap but given 1 mg of Ativan with now ability to use bipap.  Gas shows ph 7.00 with CO2>100. Patient now somnolent more than likely due to narcosis.   She was intubated and ICU was called for admission.  IMAGING x48h Dg Chest Port 1 View   08/11/2016 Endotracheal tube terminates 5.4 cm above carina. Endotracheal tube appropriately positioned. Hyperinflation, without acute disease.   08/11/2016 Tip of endotracheal tube projects 5.8 cm above carina. COPD changes with new subsegmental atelectasis at RIGHT base.   08/10/2016  Endotracheal tube seen ending 6-7 cm above the carina. This could be advanced 3 cm. 2. Findings of COPD.  Lungs otherwise grossly clear.   08/11/2016 EKG: NSR, RAE, age undetermined anterolateral infarct  Studies:  UDS: cocaine  ABG: improving  Significant events:  08/11/16: combatyive on WUA off diprivan per RN. But now doing SBT with deep sedation.   SUBJECTIVE/OVERNIGHT/INTERVAL HX Awake but barely follows command.  VITAL SIGNS: BP 135/89 (BP Location: Right Leg)   Pulse 94   Temp 98.6 F (37 C) (Oral)    Resp 18   Ht 5\' 4"  (1.626 m)   Wt 52.2 kg (115 lb 1.3 oz)   SpO2 98%   BMI 19.75 kg/m   VENTILATOR SETTINGS: Vent Mode: PRVC FiO2 (%):  [40 %-50 %] 50 % Set Rate:  [10 bmp] 10 bmp Vt Set:  [500 mL] 500 mL PEEP:  [5 cmH20] 5 cmH20 Pressure Support:  [10 cmH20] 10 cmH20 Plateau Pressure:  [12 cmH20-13 cmH20] 12 cmH20  INTAKE / OUTPUT: I/O last 3 completed shifts: In: 1861.7 [I.V.:1451.7; NG/GT:160; IV Piggyback:250] Out: 1900 [Urine:1700; Emesis/NG output:200]  PHYSICAL EXAMINATION: General: Sedated on propofol, comfortable, opens eyes but doesn't follow command Neuro: limited due to patient's poor cooperation. Last RASS +1, PERRL.  HEENT:  MMM, ETT in place Cardiovascular:  RRR, no murmurs rubs or gallops Lungs: diminished breath sounds in all lung fields. Abdomen:  Soft, NT, ND Musculoskeletal:  No deformities  LABS: PULMONARY  Recent Labs Lab 08/10/16 2348 08/11/16 1015 08/12/16 0401  PHART 7.249* 7.403 7.352  PCO2ART 68.5* 47.6 63.0*  PO2ART 84.0 51.1* 91.0  HCO3 30.1* 29.1* 35.0*  TCO2 32  --  37  O2SAT 94.0 87.3 96.0    CBC  Recent Labs Lab 08/10/16 2005 08/11/16 0256 08/12/16 0447  HGB 15.0 13.9  13.8 13.4  HCT 47.0* 44.4  44.4 42.6  WBC 16.8* 15.7*  16.3* 16.3*  PLT 274 243  250 222    COAGULATION No results for input(s): INR in the last 168 hours.  CARDIAC    Recent  Labs Lab 08/11/16 0950 08/11/16 1531 08/11/16 2131  TROPONINI 0.52* 0.49* 0.36*   No results for input(s): PROBNP in the last 168 hours.   CHEMISTRY  Recent Labs Lab 08/10/16 2005 08/11/16 0256 08/11/16 1531  NA 137 140  --   K 4.9 4.9  --   CL 100* 100*  --   CO2 24 31  --   GLUCOSE 144* 121*  --   BUN 10 14  --   CREATININE 1.03* 0.94  0.92  --   CALCIUM 9.1 8.7*  --   MG  --  2.2 2.0  PHOS  --  4.8* 3.0   Estimated Creatinine Clearance: 57.6 mL/min (by C-G formula based on SCr of 0.92 mg/dL).   LIVER No results for input(s): AST, ALT, ALKPHOS,  BILITOT, PROT, ALBUMIN, INR in the last 168 hours.   INFECTIOUS No results for input(s): LATICACIDVEN, PROCALCITON in the last 168 hours.   ENDOCRINE CBG (last 3)   Recent Labs  08/11/16 2012 08/12/16 0025 08/12/16 0425  GLUCAP 126* 148* 135*     ASSESSMENT / PLAN:  PULMONARY A: Acute COPD exacerbation with resp failure . ? Virus v cocaine v both -ABG improved  P:   Daily SAT Continue MV - permissive huprecapnia Solumedrol Duonebs Abx/Antiviral - see ID Follow RVP CXR  CARDIOVASCULAR A:  Troponin down trending. Likely demand EKG with RAE P:  Telemetry Echo  RENAL  Recent Labs Lab 08/10/16 2005 08/11/16 0256  CREATININE 1.03* 0.94  0.92    A:   AKI - baseline Cr 0.6, admit 1.03   P:   maintnanece fluids BMP  GASTROINTESTINAL A:   No issues P:   NPO Start TF Pepcid  HEMATOLOGIC A:   Mild leukocytosis: stress vs infectious P:  Daily DBC DVT ppx enoxaparin  INFECTIOUS A:   Rule out viral infection Afebrile so far Mild leucocytosis P:   Monitor for infection RVP Empiric tamiflu toill results back Empric dox 11/5>  ENDOCRINE A:   Hypothyroidism P:   Synthroid  NEUROLOGIC A:   AMS likely from hypercarbia. She is on propofol now Agitation on wua 08/11/16  P:   RASS goal: -1 to 0  Off fentanyl and versed Propofol gtt Daily SAT  FAMILY  - Updates: no family member at [place] family meet or Palliative Care meeting due by:  08/17/16  Courtney Beavers, MD.  PGY-2, [facility] 08/12/2016 6:18 AM   Attending:  I have seen and examined the patient with nurse practitioner/resident and agree with the note above.  We formulated the plan together and I elicited the following history.    Agitated overnight On propofol Failed SBT  On exam No wheezing Poor air movement Vent supported breaths Bowel sounds positive, belly soft  AE COPD> continue steroids, bronchodilators as scheduled,  f/u micro result from lab, continue doxy and tamiflu NSTEMI> continue heparin, will need cardiology consult Acute respiratory failure with hypoxemia> wean, consider d/c vent today if passes SBT  My cc time 35 minutes  Roselie Awkward, MD [facility] Pager: [phone] Cell: [phone] After 3pm or if no response, call [phone]

## 2016-08-12 NOTE — Progress Notes (Signed)
Nutrition Consult  Received MD Consult for TF initiation and management. Discussed patient in ICU rounds today. Patient has been extubated with no further need for RD consult and no nutrition intervention needed at this time. Please re-consult if nutrition concerns arise.  Molli Barrows, RD, LDN, [place] Pager [phone] After Hours Pager [phone]

## 2016-08-12 NOTE — Progress Notes (Signed)
ANTICOAGULATION CONSULT NOTE - Follow Up Consult  Pharmacy Consult for Heparin Indication: chest pain/ACS  Allergies  Allergen Reactions  . Levaquin [Levofloxacin] Hives    Patient Measurements: Height: 5\' 4"  (162.6 cm) Weight: 115 lb 1.3 oz (52.2 kg) IBW/kg (Calculated) : 54.7 Heparin Dosing Weight: 52.2 kg  Vital Signs: Temp: 98.5 F (36.9 C) (11/06 1501) Temp Source: Oral (11/06 1501) BP: 99/43 (11/06 1501) Pulse Rate: 101 (11/06 1501)  Labs:  Recent Labs  08/10/16 2005 08/11/16 0256  08/11/16 1531 08/11/16 2004 08/11/16 2131 08/12/16 0447 08/12/16 0726  HGB 15.0 13.9  13.8  --   --   --   --  13.4  --   HCT 47.0* 44.4  44.4  --   --   --   --  42.6  --   PLT 274 243  250  --   --   --   --  222  --   HEPARINUNFRC  --   --   --   --  0.42  --  0.24*  --   CREATININE 1.03* 0.94  0.92  --   --   --   --  0.62 0.74  TROPONINI  --   --   < > 0.49*  --  0.36* 0.25*  --   < > = values in this interval not displayed.  Estimated Creatinine Clearance: 66.2 mL/min (by C-G formula based on SCr of 0.74 mg/dL).  Assessment:  55 yo F admitted 08/10/2016 in respiratory distress secondary to COPD exacerbation. Patient was hypoxic and combative with EMS. She required intubation in the ED and was admitted to the ICU. Now extubated and out of ICU.    Heparin level was subtherapeutic (0.24) this am on 650 units/hr and heparin drip was increased by ~2 units/kg/hr to 750 units/hr.   Heparin level ordered for 6 hrs after rate change, but refused blood draw.  VP now reports level drawn at 1655 but sample got lost in transit and in unable to be found.  VP returned to floor for re-draw, but patient refused, reported combative.  No chest pain reported. Troponins had trended down.  Goal of Therapy:  Heparin level 0.3-0.7 units/ml Monitor platelets by anticoagulation protocol: Yes   Plan:   Continue heparin drip at 750 units/hr.  Defer next heparin level and CBC until am.  Follow up  for duration of IV heparin.   Arty Baumgartner, [place] Pager: [phone] 08/12/2016,7:20 PM

## 2016-08-12 NOTE — Procedures (Signed)
Extubation Procedure Note  Patient Details:   Name: VIONE EVERTON DOB: [birth year] MRN: [id number]   Airway Documentation:     Evaluation  O2 sats: stable throughout Complications: No apparent complications Patient did tolerate procedure well. Bilateral Breath Sounds: Rhonchi   Yes   Pt extubated to 3L [place] per MD order. Pt unable/unwilling to cough post extubation, but does speak her name when asked. Pt with diminished BS throughout. RT will closely monitor pt. RN at bedside.   Jesse Sans 08/12/2016, 11:21 AM

## 2016-08-12 NOTE — Progress Notes (Signed)
Pt was extubated to 3L [place]. HR 102 sats 96%. Pt gave a weak cough and was able to ask a question.

## 2016-08-13 ENCOUNTER — Other Ambulatory Visit (HOSPITAL_COMMUNITY): Payer: Self-pay

## 2016-08-13 DIAGNOSIS — F1910 Other psychoactive substance abuse, uncomplicated: Secondary | ICD-10-CM | POA: Diagnosis present

## 2016-08-13 DIAGNOSIS — R748 Abnormal levels of other serum enzymes: Secondary | ICD-10-CM

## 2016-08-13 DIAGNOSIS — J449 Chronic obstructive pulmonary disease, unspecified: Secondary | ICD-10-CM

## 2016-08-13 DIAGNOSIS — F1410 Cocaine abuse, uncomplicated: Secondary | ICD-10-CM | POA: Diagnosis present

## 2016-08-13 LAB — CBC WITH DIFFERENTIAL/PLATELET
BASOS ABS: 0 10*3/uL (ref 0.0–0.1)
BASOS PCT: 0 %
Eosinophils Absolute: 0 10*3/uL (ref 0.0–0.7)
Eosinophils Relative: 0 %
HEMATOCRIT: 38.9 % (ref 36.0–46.0)
HEMOGLOBIN: 12.3 g/dL (ref 12.0–15.0)
Lymphocytes Relative: 13 %
Lymphs Abs: 1.5 10*3/uL (ref 0.7–4.0)
MCH: 26.4 pg (ref 26.0–34.0)
MCHC: 31.6 g/dL (ref 30.0–36.0)
MCV: 83.5 fL (ref 78.0–100.0)
Monocytes Absolute: 1.3 10*3/uL — ABNORMAL HIGH (ref 0.1–1.0)
Monocytes Relative: 11 %
NEUTROS ABS: 9 10*3/uL — AB (ref 1.7–7.7)
NEUTROS PCT: 76 %
Platelets: 199 10*3/uL (ref 150–400)
RBC: 4.66 MIL/uL (ref 3.87–5.11)
RDW: 16.8 % — ABNORMAL HIGH (ref 11.5–15.5)
WBC: 11.8 10*3/uL — ABNORMAL HIGH (ref 4.0–10.5)

## 2016-08-13 LAB — GLUCOSE, CAPILLARY
GLUCOSE-CAPILLARY: 164 mg/dL — AB (ref 65–99)
GLUCOSE-CAPILLARY: 117 mg/dL — AB (ref 65–99)
GLUCOSE-CAPILLARY: 100 mg/dL — AB (ref 65–99)
GLUCOSE-CAPILLARY: 155 mg/dL — AB (ref 65–99)
GLUCOSE-CAPILLARY: 134 mg/dL — AB (ref 65–99)
Glucose-Capillary: 145 mg/dL — ABNORMAL HIGH (ref 65–99)

## 2016-08-13 LAB — MAGNESIUM: MAGNESIUM: 1.7 mg/dL (ref 1.7–2.4)

## 2016-08-13 LAB — RESPIRATORY VIRUS PANEL
ADENOVIRUS: NEGATIVE
INFLUENZA A: NEGATIVE
Influenza B: NEGATIVE
METAPNEUMOVIRUS: NEGATIVE
PARAINFLUENZA 3 A: NEGATIVE
Parainfluenza 1: NEGATIVE
Parainfluenza 2: NEGATIVE
RHINOVIRUS: NEGATIVE
Respiratory Syncytial Virus A: NEGATIVE
Respiratory Syncytial Virus B: NEGATIVE

## 2016-08-13 LAB — HEPARIN LEVEL (UNFRACTIONATED)
HEPARIN UNFRACTIONATED: 0.2 [IU]/mL — AB (ref 0.30–0.70)
Heparin Unfractionated: <0.1 IU/mL — ABNORMAL LOW (ref 0.30–0.70)

## 2016-08-13 LAB — PHOSPHORUS: PHOSPHORUS: 3.6 mg/dL (ref 2.5–4.6)

## 2016-08-13 MED ORDER — METHYLPREDNISOLONE SODIUM SUCC 40 MG IJ SOLR
40.0000 mg | Freq: Two times a day (BID) | INTRAMUSCULAR | Status: DC
  Administered 2016-08-13 – 2016-08-14 (×2): 40 mg via INTRAVENOUS
  Filled 2016-08-13 (×2): qty 1

## 2016-08-13 MED ORDER — IPRATROPIUM-ALBUTEROL 0.5-2.5 (3) MG/3ML IN SOLN
3.0000 mL | RESPIRATORY_TRACT | Status: DC | PRN
  Administered 2016-08-13: 3 mL via RESPIRATORY_TRACT
  Filled 2016-08-13: qty 3

## 2016-08-13 MED ORDER — FLUOXETINE HCL 20 MG PO CAPS
20.0000 mg | ORAL_CAPSULE | Freq: Every day | ORAL | Status: DC
  Administered 2016-08-13 – 2016-08-15 (×3): 20 mg via ORAL
  Filled 2016-08-13 (×3): qty 1

## 2016-08-13 MED ORDER — GUAIFENESIN ER 600 MG PO TB12
600.0000 mg | ORAL_TABLET | Freq: Two times a day (BID) | ORAL | Status: DC | PRN
  Administered 2016-08-13 – 2016-08-14 (×2): 600 mg via ORAL
  Filled 2016-08-13 (×3): qty 1

## 2016-08-13 MED ORDER — DOXYCYCLINE HYCLATE 100 MG PO TABS
100.0000 mg | ORAL_TABLET | Freq: Two times a day (BID) | ORAL | Status: DC
  Administered 2016-08-13 – 2016-08-15 (×4): 100 mg via ORAL
  Filled 2016-08-13 (×4): qty 1

## 2016-08-13 NOTE — Progress Notes (Signed)
ANTICOAGULATION CONSULT NOTE - Follow Up Consult  Pharmacy Consult for Heparin Indication: chest pain/ACS  Allergies  Allergen Reactions  . Levaquin [Levofloxacin] Hives    Patient Measurements: Height: 5\' 4"  (162.6 cm) Weight: 117 lb 4.8 oz (53.2 kg) IBW/kg (Calculated) : 54.7 Heparin Dosing Weight: 53.2 kg  Vital Signs: Temp: 98.5 F (36.9 C) (11/07 1513) Temp Source: Oral (11/07 1513) BP: 95/50 (11/07 1513) Pulse Rate: 94 (11/07 1513)  Labs:  Recent Labs  08/11/16 0256  08/11/16 1531  08/11/16 2131 08/12/16 0447 08/12/16 0726 08/13/16 0816 08/13/16 0822 08/13/16 1846  HGB 13.9  13.8  --   --   --   --  13.4  --  12.3  --   --   HCT 44.4  44.4  --   --   --   --  42.6  --  38.9  --   --   PLT 243  250  --   --   --   --  222  --  199  --   --   HEPARINUNFRC  --   --   --   < >  --  0.24*  --   --  <0.10* 0.20*  CREATININE 0.94  0.92  --   --   --   --  0.62 0.74  --   --   --   TROPONINI  --   < > 0.49*  --  0.36* 0.25*  --   --   --   --   < > = values in this interval not displayed.  Estimated Creatinine Clearance: 67.5 mL/min (by C-G formula based on SCr of 0.74 mg/dL).  Assessment: 55 yo F continues on heparin for elevated troponins / NSTEMI.  Patient has been intermittently refusing labs and heparin has been difficult to monitor.  To continue heparin per Cardiology.    Heparin level undetectable this morning on 750 units/hr and rate increased to 1000 units/hr.  Level is up to 0.2 this evening, but still below goal. No infusion problems reported.    Goal of Therapy:  Heparin level 0.3-0.7 units/ml Monitor platelets by anticoagulation protocol: Yes   Plan:   Increase heparin drip to 1200 units/hr.  Next heparin level and CBC in am.  Follow up for duration of IV heparin.   Arty Baumgartner, [place] Pager: [phone] 08/13/2016,8:26 PM

## 2016-08-13 NOTE — Progress Notes (Signed)
PROGRESS NOTE    Courtney Terry  [id number] DOB: [birth year] DOA: 08/10/2016 PCP: Pcp Not In System  Brief Narrative: Courtney Terry is a 55 year old female with history of severe COPD FEV1 of 23% predicted presented to the emergency room with increasing shortness of breath she was found to be in acute hypoxic and hypercarbic respiratory failure with severe respiratory acidosis and intubated since the emergency room. Admitted to the ICU, treated for COPD exacerbation with antibiotics, steroids, nebs and started on IV heparin for NSTEMI versus demand ischemia.  She was extubated yesterday 11/6 and transferred to the floor to [facility] today 11/7 She reports breathing better, does have a productive cough and has intermittent chest pain  Assessment & Plan:   1. Acute hypoxic and hypercarbic respiratory failure -Secondary to COPD exacerbation/polysubstance abuse -improving -Extubated 11/6 -Continue IV steroids today, doxycycline, duonebs -Stop Tamiflu, flu PCR negative  2. NSTEMI vs demand ischemia -In the setting of respiratory failure and positive cocaine -Started on IV heparin in ICU per PCCM,  -check ECHO -cards consulted to determine need for ischemia evaluation  3. History of breast cancer  -Resume Arimidex   4. History of bipolar disorder/schizophrenia -Resume Depakote  5. Polysubstance abuse including cocaine and tobacco -Counseled, tearful and reports that she is motivated to quit  6. Severe protein calorie malnutrition -Supplements as tolerated  7. ? Seizure disorder -continue depakote, unclear if she is on this for seizures or mood/bipolar do  He DVT prophylaxis:on IV heparin Code Status:FUll code Family Communication:None at bedside Disposition Plan:home in 1-2days   Consultants:   PCCM transfer   Cards     Antimicrobials: doxy 11/3-   Subjective: Breathing better, still coughing a lot  Objective: Vitals:   08/13/16 0111  08/13/16 0200 08/13/16 0530 08/13/16 1051  BP:   (!) 90/57 103/79  Pulse:   94 85  Resp:   16   Temp:   98.3 F (36.8 C) 97.7 F (36.5 C)  TempSrc:   Oral Oral  SpO2:  98% 95% 95%  Weight: 53.2 kg (117 lb 4.8 oz)     Height:        Intake/Output Summary (Last 24 hours) at 08/13/16 1111 Last data filed at 08/13/16 1040  Gross per 24 hour  Intake             1345 ml  Output             1000 ml  Net              345 ml   Filed Weights   08/11/16 0416 08/12/16 0422 08/13/16 0111  Weight: 52.8 kg (116 lb 6.5 oz) 52.2 kg (115 lb 1.3 oz) 53.2 kg (117 lb 4.8 oz)    Examination:  General exam: thinly built frail female, AAOx3, no distress  Respiratory system: diffuse ronchi Cardiovascular system: S1 & S2 heard, RRR. No JVD, murmurs, rubs, gallops or clicks Gastrointestinal system: Abdomen is nondistended, soft and nontender. No organomegaly or masses felt. Normal bowel sounds heard. Central nervous system: Alert and oriented. No focal neurological deficits. Extremities: Symmetric 5 x 5 power. Skin: No rashes, lesions or ulcers Psychiatry: Judgement and insight appear normal. Mood & affect appropriate.     Data Reviewed: I have personally reviewed following labs and imaging studies  CBC:  Recent Labs Lab 08/10/16 2005 08/11/16 0256 08/12/16 0447 08/13/16 0816  WBC 16.8* 15.7*  16.3* 16.3* 11.8*  NEUTROABS 10.6*  --  12.7* 9.0*  HGB  15.0 13.9  13.8 13.4 12.3  HCT 47.0* 44.4  44.4 42.6 38.9  MCV 84.8 85.2  84.9 83.7 83.5  PLT 274 243  250 222 [id number]   Basic Metabolic Panel:  Recent Labs Lab 08/10/16 2005 08/11/16 0256 08/11/16 1531 08/12/16 0447 08/12/16 0726 08/13/16 0816  NA 137 140  --  138 138  --   K 4.9 4.9  --  4.4 4.4  --   CL 100* 100*  --  99* 100*  --   CO2 24 31  --  31 28  --   GLUCOSE 144* 121*  --  94 76  --   BUN 10 14  --  19 19  --   CREATININE 1.03* 0.94  0.92  --  0.62 0.74  --   CALCIUM 9.1 8.7*  --  9.1 9.0  --   MG  --  2.2 2.0  2.1  --  1.7  PHOS  --  4.8* 3.0 3.8  --  3.6   GFR: Estimated Creatinine Clearance: 67.5 mL/min (by C-G formula based on SCr of 0.74 mg/dL). Liver Function Tests:  Recent Labs Lab 08/12/16 0447  AST 29  ALT 18  ALKPHOS 87  BILITOT 0.2*  PROT 6.2*  ALBUMIN 3.2*   No results for input(s): LIPASE, AMYLASE in the last 168 hours. No results for input(s): AMMONIA in the last 168 hours. Coagulation Profile: No results for input(s): INR, PROTIME in the last 168 hours. Cardiac Enzymes:  Recent Labs Lab 08/11/16 0950 08/11/16 1531 08/11/16 2131 08/12/16 0447  TROPONINI 0.52* 0.49* 0.36* 0.25*   BNP (last 3 results) No results for input(s): PROBNP in the last 8760 hours. HbA1C: No results for input(s): HGBA1C in the last 72 hours. CBG:  Recent Labs Lab 08/12/16 1605 08/12/16 2047 08/12/16 2348 08/13/16 0355 08/13/16 0811  GLUCAP 107* 104* 141* 164* 117*   Lipid Profile:  Recent Labs  08/11/16 0500 08/12/16 0447  TRIG 44 35   Thyroid Function Tests: No results for input(s): TSH, T4TOTAL, FREET4, T3FREE, THYROIDAB in the last 72 hours. Anemia Panel: No results for input(s): VITAMINB12, FOLATE, FERRITIN, TIBC, IRON, RETICCTPCT in the last 72 hours. Urine analysis:    Component Value Date/Time   COLORURINE YELLOW 08/11/2016 0318   APPEARANCEUR CLOUDY (A) 08/11/2016 0318   LABSPEC 1.018 08/11/2016 0318   PHURINE 5.5 08/11/2016 0318   GLUCOSEU NEGATIVE 08/11/2016 0318   HGBUR NEGATIVE 08/11/2016 0318   BILIRUBINUR NEGATIVE 08/11/2016 0318   KETONESUR NEGATIVE 08/11/2016 0318   PROTEINUR 30 (A) 08/11/2016 0318   UROBILINOGEN 0.2 03/06/2014 1921   NITRITE NEGATIVE 08/11/2016 0318   LEUKOCYTESUR NEGATIVE 08/11/2016 0318   Sepsis Labs: @LABRCNTIP (procalcitonin:4,lacticidven:4)  ) Recent Results (from the past 240 hour(s))  MRSA PCR Screening     Status: None   Collection Time: 08/11/16  1:10 AM  Result Value Ref Range Status   MRSA by PCR NEGATIVE  NEGATIVE Final    Comment:        The GeneXpert MRSA Assay (FDA approved for NASAL specimens only), is one component of a comprehensive MRSA colonization surveillance program. It is not intended to diagnose MRSA infection nor to guide or monitor treatment for MRSA infections.   Respiratory virus panel     Status: None   Collection Time: 08/11/16  9:59 AM  Result Value Ref Range Status   Source - RVPAN TRACHEAL ASPIRATE  Final   Respiratory Syncytial Virus A Negative Negative Final   Respiratory Syncytial Virus  B Negative Negative Final   Influenza A Negative Negative Final   Influenza B Negative Negative Final   Parainfluenza 1 Negative Negative Final   Parainfluenza 2 Negative Negative Final   Parainfluenza 3 Negative Negative Final   Metapneumovirus Negative Negative Final   Rhinovirus Negative Negative Final   Adenovirus Negative Negative Final    Comment: (NOTE) Performed At: [facility] [place], [place] [id number] Lindon Romp MD [id number]   Respiratory Panel by PCR     Status: None   Collection Time: 08/12/16 10:09 AM  Result Value Ref Range Status   Adenovirus NOT DETECTED NOT DETECTED Final   Coronavirus 229E NOT DETECTED NOT DETECTED Final   Coronavirus HKU1 NOT DETECTED NOT DETECTED Final   Coronavirus NL63 NOT DETECTED NOT DETECTED Final   Coronavirus OC43 NOT DETECTED NOT DETECTED Final   Metapneumovirus NOT DETECTED NOT DETECTED Final   Rhinovirus / Enterovirus NOT DETECTED NOT DETECTED Final   Influenza A NOT DETECTED NOT DETECTED Final   Influenza B NOT DETECTED NOT DETECTED Final   Parainfluenza Virus 1 NOT DETECTED NOT DETECTED Final   Parainfluenza Virus 2 NOT DETECTED NOT DETECTED Final   Parainfluenza Virus 3 NOT DETECTED NOT DETECTED Final   Parainfluenza Virus 4 NOT DETECTED NOT DETECTED Final   Respiratory Syncytial Virus NOT DETECTED NOT DETECTED Final   Bordetella pertussis NOT DETECTED NOT DETECTED Final    Chlamydophila pneumoniae NOT DETECTED NOT DETECTED Final   Mycoplasma pneumoniae NOT DETECTED NOT DETECTED Final         Radiology Studies: Dg Chest Port 1 View  Result Date: 08/12/2016 CLINICAL DATA:  Hypoxia EXAM: PORTABLE CHEST 1 VIEW COMPARISON:  August 11, 2016 FINDINGS: Endotracheal tube tip is 5.6 cm above the carina. Nasogastric tube tip and side port are below the diaphragm. No pneumothorax. There is atelectasis in the right lung base. Lungs elsewhere are clear. Heart size and pulmonary vascularity are normal. No adenopathy. Patient is status post right mastectomy with surgical clips in the right axillary region. IMPRESSION: Tube positions as described without pneumothorax. Right base atelectasis. Lungs elsewhere clear. Electronically Signed   By: Lowella Grip III M.D.   On: 08/12/2016 08:22   Dg Chest Port 1 View  Result Date: 08/11/2016 CLINICAL DATA:  Endotracheal tube advance. EXAM: PORTABLE CHEST 1 VIEW COMPARISON:  Earlier today at 933 hours. FINDINGS: 1943 hours. Endotracheal tube terminates 5.4 cm above carina.Nasogastric tube extends beyond the inferior aspect of the film. Hyperinflation. Right axillary node dissection. Soft tissue expander in the right chest. Normal heart size. No pleural effusion or pneumothorax. Right base atelectasis laterally. Numerous leads and wires project over the chest. IMPRESSION: Endotracheal tube appropriately positioned. Hyperinflation, without acute disease. Electronically Signed   By: Abigail Miyamoto M.D.   On: 08/11/2016 15:07        Scheduled Meds: . anastrozole  1 mg Oral Daily  . aspirin  81 mg Oral Daily  . divalproex  500 mg Oral BID  . doxycycline  100 mg Oral Q12H  . famotidine  20 mg Oral BID  . feeding supplement (PRO-STAT SUGAR FREE 64)  30 mL Per Tube BID  . ipratropium-albuterol  3 mL Nebulization Q6H  . levothyroxine  75 mcg Oral QAC breakfast  . mouth rinse  15 mL Mouth Rinse BID  . methylPREDNISolone (SOLU-MEDROL)  injection  40 mg Intravenous Q24H   Continuous Infusions: . heparin 750 Units/hr (08/12/16 2119)     LOS: 3 days  Time spent: 73min    Domenic Polite, MD [facility] Pager [phone]  If 7PM-7AM, please contact night-coverage [email or web address] Password [email or web address] 08/13/2016, 11:11 AM
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
## 2016-08-13 NOTE — Care Management Note (Signed)
Case Management Note  Patient Details  Name: Courtney Terry MRN: [id number] Date of Birth: 13-Aug-1961  Subjective/Objective:                 Patient admitted from home with resp failure, was transferred from MICU w/in last 24 hours. Lives at home with a "friend." Patient would not elaborate and was reluctant to answer questions. CM asked what friend helped with and her response was, "everything." Patient states she has oxygen through [facility], and a working nebulizer with a few boxes of medications. She states she has a HHA through [facility] [phone] for 7 hours a day 7 days a week. She states her counselor from [place] drives her to appointments. Pharmacy is [facility] on [address]. Patient has been non cooperative with staff and plan of care.   Action/Plan:  CM will continue to follow for DC planning needs.  Expected Discharge Date:                  Expected Discharge Plan:  Home/Self Care  In-House Referral:     Discharge planning Services  CM Consult  Post Acute Care Choice:    Choice offered to:     DME Arranged:    DME Agency:     HH Arranged:    HH Agency:     Status of Service:  In process, will continue to follow  If discussed at Long Length of Stay Meetings, dates discussed:    Additional Comments:  Carles Collet, RN 08/13/2016, 12:43 PM

## 2016-08-13 NOTE — Progress Notes (Signed)
ANTICOAGULATION CONSULT NOTE - Follow Up Consult  Pharmacy Consult for Heparin Indication: chest pain/ACS  Allergies  Allergen Reactions  . Levaquin [Levofloxacin] Hives    Patient Measurements: Height: 5\' 4"  (162.6 cm) Weight: 117 lb 4.8 oz (53.2 kg) IBW/kg (Calculated) : 54.7 Heparin Dosing Weight: 53.2 kg  Vital Signs: Temp: 97.7 F (36.5 C) (11/07 1051) Temp Source: Oral (11/07 1051) BP: 103/79 (11/07 1051) Pulse Rate: 85 (11/07 1051)  Labs:  Recent Labs  08/11/16 0256  08/11/16 1531 08/11/16 2004 08/11/16 2131 08/12/16 0447 08/12/16 0726 08/13/16 0816 08/13/16 0822  HGB 13.9  13.8  --   --   --   --  13.4  --  12.3  --   HCT 44.4  44.4  --   --   --   --  42.6  --  38.9  --   PLT 243  250  --   --   --   --  222  --  199  --   HEPARINUNFRC  --   --   --  0.42  --  0.24*  --   --  <0.10*  CREATININE 0.94  0.92  --   --   --   --  0.62 0.74  --   --   TROPONINI  --   < > 0.49*  --  0.36* 0.25*  --   --   --   < > = values in this interval not displayed.  Estimated Creatinine Clearance: 67.5 mL/min (by C-G formula based on SCr of 0.74 mg/dL).   Medications:  Scheduled:  . anastrozole  1 mg Oral Daily  . aspirin  81 mg Oral Daily  . divalproex  500 mg Oral BID  . doxycycline (VIBRAMYCIN) IV  100 mg Intravenous Q12H  . famotidine  20 mg Oral BID  . feeding supplement (PRO-STAT SUGAR FREE 64)  30 mL Per Tube BID  . ipratropium-albuterol  3 mL Nebulization Q6H  . levothyroxine  75 mcg Oral QAC breakfast  . mouth rinse  15 mL Mouth Rinse BID  . methylPREDNISolone (SOLU-MEDROL) injection  40 mg Intravenous Q24H   Infusions:  . heparin 750 Units/hr (08/12/16 2119)    Assessment: 55 yo F continues on heparin for elevated troponins / NSTEMI.  Patient has been intermittently refusing labs and heparin has been difficult to monitor.  Heparin level this morning was undetectable.  No IV issues per RN.  Spoke with Dr. Broadus John who has requested a cardiology  consult for further management.  Heparin may be discontinued later today following 48hours of medical management.   Goal of Therapy:  Heparin level 0.3-0.7 units/ml Monitor platelets by anticoagulation protocol: Yes   Plan:  Increase heparin infusion to 1000 units/hr. Monitor 6hr heparin level, daily heparin level and CBC Monitor for s/sx of bleeding  Stop heparin this PM? Follow-up cards recs Stop Tamiflu - respiratory viral panel negative Change Doxy to PO  [employer], Pharm.D., BCPS Clinical Pharmacist Pager [phone] 08/13/2016 11:03 AM

## 2016-08-14 ENCOUNTER — Inpatient Hospital Stay (HOSPITAL_COMMUNITY): Payer: Medicaid Other

## 2016-08-14 DIAGNOSIS — R7889 Finding of other specified substances, not normally found in blood: Secondary | ICD-10-CM

## 2016-08-14 DIAGNOSIS — J9622 Acute and chronic respiratory failure with hypercapnia: Secondary | ICD-10-CM

## 2016-08-14 DIAGNOSIS — J9621 Acute and chronic respiratory failure with hypoxia: Principal | ICD-10-CM

## 2016-08-14 LAB — BASIC METABOLIC PANEL
Anion gap: 8 (ref 5–15)
BUN: 16 mg/dL (ref 6–20)
CALCIUM: 9 mg/dL (ref 8.9–10.3)
CHLORIDE: 105 mmol/L (ref 101–111)
CO2: 30 mmol/L (ref 22–32)
CREATININE: 0.58 mg/dL (ref 0.44–1.00)
GFR calc Af Amer: >60 mL/min (ref >60)
GFR calc non Af Amer: >60 mL/min (ref >60)
GLUCOSE: 137 mg/dL — AB (ref 65–99)
Potassium: 4 mmol/L (ref 3.5–5.1)
Sodium: 143 mmol/L (ref 135–145)

## 2016-08-14 LAB — GLUCOSE, CAPILLARY
GLUCOSE-CAPILLARY: 112 mg/dL — AB (ref 65–99)
Glucose-Capillary: 103 mg/dL — ABNORMAL HIGH (ref 65–99)
Glucose-Capillary: 111 mg/dL — ABNORMAL HIGH (ref 65–99)
Glucose-Capillary: 128 mg/dL — ABNORMAL HIGH (ref 65–99)
Glucose-Capillary: 86 mg/dL (ref 65–99)
Glucose-Capillary: 92 mg/dL (ref 65–99)

## 2016-08-14 LAB — CBC WITH DIFFERENTIAL/PLATELET
Basophils Absolute: 0 10*3/uL (ref 0.0–0.1)
Basophils Relative: 0 %
EOS PCT: 0 %
Eosinophils Absolute: 0 10*3/uL (ref 0.0–0.7)
HEMATOCRIT: 39.7 % (ref 36.0–46.0)
Hemoglobin: 12.2 g/dL (ref 12.0–15.0)
LYMPHS ABS: 0.7 10*3/uL (ref 0.7–4.0)
LYMPHS PCT: 7 %
MCH: 26.3 pg (ref 26.0–34.0)
MCHC: 30.7 g/dL (ref 30.0–36.0)
MCV: 85.6 fL (ref 78.0–100.0)
MONO ABS: 0.4 10*3/uL (ref 0.1–1.0)
Monocytes Relative: 4 %
NEUTROS ABS: 8.8 10*3/uL — AB (ref 1.7–7.7)
Neutrophils Relative %: 89 %
PLATELETS: 221 10*3/uL (ref 150–400)
RBC: 4.64 MIL/uL (ref 3.87–5.11)
RDW: 17.1 % — AB (ref 11.5–15.5)
WBC: 9.9 10*3/uL (ref 4.0–10.5)

## 2016-08-14 LAB — HEPARIN LEVEL (UNFRACTIONATED)
HEPARIN UNFRACTIONATED: 0.37 [IU]/mL (ref 0.30–0.70)
Heparin Unfractionated: 0.39 IU/mL (ref 0.30–0.70)

## 2016-08-14 LAB — ECHOCARDIOGRAM COMPLETE
Height: 64 in
Weight: 1908.3 oz

## 2016-08-14 LAB — PHOSPHORUS: Phosphorus: 3.6 mg/dL (ref 2.5–4.6)

## 2016-08-14 LAB — MAGNESIUM: Magnesium: 1.6 mg/dL — ABNORMAL LOW (ref 1.7–2.4)

## 2016-08-14 MED ORDER — IPRATROPIUM-ALBUTEROL 0.5-2.5 (3) MG/3ML IN SOLN
3.0000 mL | Freq: Four times a day (QID) | RESPIRATORY_TRACT | Status: DC
  Administered 2016-08-14 – 2016-08-15 (×4): 3 mL via RESPIRATORY_TRACT
  Filled 2016-08-14 (×4): qty 3

## 2016-08-14 MED ORDER — MAGNESIUM OXIDE 400 (241.3 MG) MG PO TABS
400.0000 mg | ORAL_TABLET | Freq: Two times a day (BID) | ORAL | Status: AC
  Administered 2016-08-14: 400 mg via ORAL
  Filled 2016-08-14 (×2): qty 1

## 2016-08-14 MED ORDER — PREDNISONE 20 MG PO TABS
40.0000 mg | ORAL_TABLET | Freq: Every day | ORAL | Status: DC
  Administered 2016-08-15: 40 mg via ORAL
  Filled 2016-08-14: qty 2

## 2016-08-14 MED ORDER — ALBUTEROL SULFATE (2.5 MG/3ML) 0.083% IN NEBU: 2.5000 mg | INHALATION_SOLUTION | RESPIRATORY_TRACT | Status: DC | PRN

## 2016-08-14 NOTE — Progress Notes (Signed)
  Echocardiogram 2D Echocardiogram has been performed.  Darlina Sicilian M 08/14/2016, 9:20 AM

## 2016-08-14 NOTE — Progress Notes (Signed)
ANTICOAGULATION CONSULT NOTE - Follow Up Consult  Pharmacy Consult for Heparin Indication: chest pain/ACS  Allergies  Allergen Reactions  . Levaquin [Levofloxacin] Hives    Patient Measurements: Height: 5\' 4"  (162.6 cm) Weight: 119 lb 4.3 oz (54.1 kg) IBW/kg (Calculated) : 54.7 Heparin Dosing Weight: 53.2  Vital Signs: Temp: 98.7 F (37.1 C) (11/08 1350) Temp Source: Oral (11/08 1350) BP: 95/63 (11/08 1350) Pulse Rate: 93 (11/08 1350)  Labs:  Recent Labs  08/11/16 1531  08/11/16 2131  08/12/16 0447 08/12/16 0726 08/13/16 0816  08/13/16 1846 08/14/16 0619 08/14/16 1405  HGB  --   --   --   < > 13.4  --  12.3  --   --  12.2  --   HCT  --   --   --   --  42.6  --  38.9  --   --  39.7  --   PLT  --   --   --   --  222  --  199  --   --  221  --   HEPARINUNFRC  --   < >  --   --  0.24*  --   --   < > 0.20* 0.39 0.37  CREATININE  --   --   --   --  0.62 0.74  --   --   --  0.58  --   TROPONINI 0.49*  --  0.36*  --  0.25*  --   --   --   --   --   --   < > = values in this interval not displayed.  Estimated Creatinine Clearance: 68.7 mL/min (by C-G formula based on SCr of 0.58 mg/dL).  Assessment: 55 yo F continues on heparin for elevated troponins / NSTEMI. Patient has been intermittently refusing labs and heparin has been difficult to monitor.  To continue heparin per Cardiology. Heparin level within goal, 0.37. No signs of bleeding.  Goal of Therapy:  Heparin level 0.3-0.7 units/ml Monitor platelets by anticoagulation protocol: Yes   Plan:  Continue heparin drip at 1200 units/hr F/U heparin level and CBC in am.  [place] Student 08/14/2016,2:52 PM

## 2016-08-14 NOTE — Progress Notes (Signed)
ANTICOAGULATION CONSULT NOTE - Follow Up Consult  Pharmacy Consult for heparin Indication: NSTEMI  Labs:  Recent Labs  08/11/16 1531  08/11/16 2131  08/12/16 0447 08/12/16 0726 08/13/16 0816 08/13/16 0822 08/13/16 1846 08/14/16 0619  HGB  --   --   --   < > 13.4  --  12.3  --   --  12.2  HCT  --   --   --   --  42.6  --  38.9  --   --  39.7  PLT  --   --   --   --  222  --  199  --   --  221  HEPARINUNFRC  --   < >  --   --  0.24*  --   --  <0.10* 0.20* 0.39  CREATININE  --   --   --   --  0.62 0.74  --   --   --   --   TROPONINI 0.49*  --  0.36*  --  0.25*  --   --   --   --   --   < > = values in this interval not displayed.   Assessment/Plan:  55yo female therapeutic on heparin after rate changes. Will continue gtt at current rate and confirm stable with additional level.   Wynona Neat, PharmD, BCPS  08/14/2016,7:10 AM

## 2016-08-14 NOTE — Progress Notes (Addendum)
PROGRESS NOTE    Courtney Terry  [id number] DOB: September 25, 1961 DOA: 08/10/2016 PCP: Pcp Not In System     Brief Narrative:  Courtney Terry is a 55 year old female with history of severe COPD FEV1 of 23% predicted presented to the emergency room with increasing shortness of breath she was found to be in acute hypoxic and hypercarbic respiratory failure with severe respiratory acidosis and intubated since the emergency room. She was admitted to the ICU, treated for COPD exacerbation with antibiotics, steroids, nebs and started on IV heparin for NSTEMI versus demand ischemia. She was extubated 11/6 and transferred to the floor to [facility] 11/7.   Assessment & Plan:   Active Problems:   COPD GOLD IV with reversibility/ still smoking   Chronic respiratory failure with hypercapnia (HCC)   Elevated troponin   Protein-calorie malnutrition, severe   Respiratory failure with hypercapnia (HCC)   Polysubstance abuse   Cocaine abuse   Acute hypoxic and hypercarbic respiratory failure -Secondary to COPD exacerbation/polysubstance abuse, extubated 11/6 -Stop Tamiflu, flu PCR negative -Continue doxycycline, duonebs -Switch steroids to PO today   NSTEMI vs demand ischemia -In the setting of respiratory failure and cocaine abuse  -Troponin peak at 0.52  -Cardiology following  -Continue IV heparin for now  -Continue aspirin  -Echo pending  -Will likely pursue medical tx vs invasive procedure as patient has significant hx of drug abuse and likely a poor candidate for follow up.   Hypomagnesemia -Replace today   Hypothyroidism -Continue synthroid   History of breast cancer  -Continue Arimidex   Polysubstance abuse including cocaine and tobacco -Counseled on cessation numerous times. She denies daily abuse and states she is through with drugs; however, friend in room states patient is being dishonest with this statement and patient has previously admitted to daily drug  abuse since age 77  -SW consulted, patient interested in rehab   Severe protein calorie malnutrition -Supplements as tolerated  History of bipolar disorder/schizophrenia -Continue depakote, unclear if she is on this for seizures or mood/bipolar do -Continue prozac   ? Seizure disorder -Continue depakote, unclear if she is on this for seizures or mood/bipolar do  DVT prophylaxis: IV heparin Code Status: Full Family Communication: friend at bedside  Disposition Plan: pending further improvement, PT SW consulted today   Consultants:   PCCM  Cardiology  Procedures:   none  Antimicrobials:   Doxycycline 11/3 >>   Subjective: Patient states she has chest pain due to her breathing issue. Denies current chest pressure or shortness of breath. Has productive cough. No N/V/abdominal pain.   Objective: Vitals:   08/13/16 1513 08/13/16 2030 08/13/16 2249 08/14/16 0435  BP: (!) 95/50 106/62  104/70  Pulse: 94 88  98  Resp: 17 18  18   Temp: 98.5 F (36.9 C) 98.7 F (37.1 C)  98 F (36.7 C)  TempSrc: Oral Oral  Oral  SpO2: 95% 95% 98% 92%  Weight:    54.1 kg (119 lb 4.3 oz)  Height:        Intake/Output Summary (Last 24 hours) at 08/14/16 0822 Last data filed at 08/14/16 0602  Gross per 24 hour  Intake           891.17 ml  Output                1 ml  Net           890.17 ml   Filed Weights   08/12/16 0422 08/13/16 0111 08/14/16 0435  Weight: 52.2 kg (115 lb 1.3 oz) 53.2 kg (117 lb 4.8 oz) 54.1 kg (119 lb 4.3 oz)    Examination:  General exam: Appears calm and comfortable Respiratory system: Clear to auscultation. Respiratory effort normal. No wheeze  Cardiovascular system: S1 & S2 heard, RRR. No JVD, murmurs, rubs, gallops or clicks. No pedal edema. Gastrointestinal system: Abdomen is nondistended, soft and nontender. No organomegaly or masses felt. Normal bowel sounds heard. Central nervous system: Alert and oriented. No focal neurological  deficits. Extremities: Symmetric 5 x 5 power. Skin: No rashes, lesions or ulcers Psychiatry: Judgement and insight appear poor. Verbally abusive to friend at bedside   Data Reviewed: I have personally reviewed following labs and imaging studies  CBC:  Recent Labs Lab 08/10/16 2005 08/11/16 0256 08/12/16 0447 08/13/16 0816 08/14/16 0619  WBC 16.8* 15.7*  16.3* 16.3* 11.8* 9.9  NEUTROABS 10.6*  --  12.7* 9.0* 8.8*  HGB 15.0 13.9  13.8 13.4 12.3 12.2  HCT 47.0* 44.4  44.4 42.6 38.9 39.7  MCV 84.8 85.2  84.9 83.7 83.5 85.6  PLT 274 243  250 222 199 [id number]   Basic Metabolic Panel:  Recent Labs Lab 08/10/16 2005 08/11/16 0256 08/11/16 1531 08/12/16 0447 08/12/16 0726 08/13/16 0816 08/14/16 0619  NA 137 140  --  138 138  --  143  K 4.9 4.9  --  4.4 4.4  --  4.0  CL 100* 100*  --  99* 100*  --  105  CO2 24 31  --  31 28  --  30  GLUCOSE 144* 121*  --  94 76  --  137*  BUN 10 14  --  19 19  --  16  CREATININE 1.03* 0.94  0.92  --  0.62 0.74  --  0.58  CALCIUM 9.1 8.7*  --  9.1 9.0  --  9.0  MG  --  2.2 2.0 2.1  --  1.7 1.6*  PHOS  --  4.8* 3.0 3.8  --  3.6 3.6   GFR: Estimated Creatinine Clearance: 68.7 mL/min (by C-G formula based on SCr of 0.58 mg/dL). Liver Function Tests:  Recent Labs Lab 08/12/16 0447  AST 29  ALT 18  ALKPHOS 87  BILITOT 0.2*  PROT 6.2*  ALBUMIN 3.2*   No results for input(s): LIPASE, AMYLASE in the last 168 hours. No results for input(s): AMMONIA in the last 168 hours. Coagulation Profile: No results for input(s): INR, PROTIME in the last 168 hours. Cardiac Enzymes:  Recent Labs Lab 08/11/16 0950 08/11/16 1531 08/11/16 2131 08/12/16 0447  TROPONINI 0.52* 0.49* 0.36* 0.25*   BNP (last 3 results) No results for input(s): PROBNP in the last 8760 hours. HbA1C: No results for input(s): HGBA1C in the last 72 hours. CBG:  Recent Labs Lab 08/13/16 1723 08/13/16 1941 08/13/16 2326 08/14/16 0432 08/14/16 0731  GLUCAP 145*  155* 134* 128* 112*   Lipid Profile:  Recent Labs  08/12/16 0447  TRIG 35   Thyroid Function Tests: No results for input(s): TSH, T4TOTAL, FREET4, T3FREE, THYROIDAB in the last 72 hours. Anemia Panel: No results for input(s): VITAMINB12, FOLATE, FERRITIN, TIBC, IRON, RETICCTPCT in the last 72 hours. Sepsis Labs: No results for input(s): PROCALCITON, LATICACIDVEN in the last 168 hours.  Recent Results (from the past 240 hour(s))  MRSA PCR Screening     Status: None   Collection Time: 08/11/16  1:10 AM  Result Value Ref Range Status   MRSA by PCR NEGATIVE NEGATIVE Final  Comment:        The GeneXpert MRSA Assay (FDA approved for NASAL specimens only), is one component of a comprehensive MRSA colonization surveillance program. It is not intended to diagnose MRSA infection nor to guide or monitor treatment for MRSA infections.   Respiratory virus panel     Status: None   Collection Time: 08/11/16  9:59 AM  Result Value Ref Range Status   Source - RVPAN TRACHEAL ASPIRATE  Final   Respiratory Syncytial Virus A Negative Negative Final   Respiratory Syncytial Virus B Negative Negative Final   Influenza A Negative Negative Final   Influenza B Negative Negative Final   Parainfluenza 1 Negative Negative Final   Parainfluenza 2 Negative Negative Final   Parainfluenza 3 Negative Negative Final   Metapneumovirus Negative Negative Final   Rhinovirus Negative Negative Final   Adenovirus Negative Negative Final    Comment: (NOTE) Performed At: [facility] [place], [place] [id number] Lindon Romp MD [id number]   Respiratory Panel by PCR     Status: None   Collection Time: 08/12/16 10:09 AM  Result Value Ref Range Status   Adenovirus NOT DETECTED NOT DETECTED Final   Coronavirus 229E NOT DETECTED NOT DETECTED Final   Coronavirus HKU1 NOT DETECTED NOT DETECTED Final   Coronavirus NL63 NOT DETECTED NOT DETECTED Final   Coronavirus OC43 NOT  DETECTED NOT DETECTED Final   Metapneumovirus NOT DETECTED NOT DETECTED Final   Rhinovirus / Enterovirus NOT DETECTED NOT DETECTED Final   Influenza A NOT DETECTED NOT DETECTED Final   Influenza B NOT DETECTED NOT DETECTED Final   Parainfluenza Virus 1 NOT DETECTED NOT DETECTED Final   Parainfluenza Virus 2 NOT DETECTED NOT DETECTED Final   Parainfluenza Virus 3 NOT DETECTED NOT DETECTED Final   Parainfluenza Virus 4 NOT DETECTED NOT DETECTED Final   Respiratory Syncytial Virus NOT DETECTED NOT DETECTED Final   Bordetella pertussis NOT DETECTED NOT DETECTED Final   Chlamydophila pneumoniae NOT DETECTED NOT DETECTED Final   Mycoplasma pneumoniae NOT DETECTED NOT DETECTED Final       Radiology Studies: No results found.    Scheduled Meds: . anastrozole  1 mg Oral Daily  . aspirin  81 mg Oral Daily  . divalproex  500 mg Oral BID  . doxycycline  100 mg Oral Q12H  . famotidine  20 mg Oral BID  . feeding supplement (PRO-STAT SUGAR FREE 64)  30 mL Per Tube BID  . FLUoxetine  20 mg Oral Daily  . ipratropium-albuterol  3 mL Nebulization QID  . levothyroxine  75 mcg Oral QAC breakfast  . mouth rinse  15 mL Mouth Rinse BID  . methylPREDNISolone (SOLU-MEDROL) injection  40 mg Intravenous Q12H   Continuous Infusions: . heparin 1,200 Units/hr (08/14/16 0038)     LOS: 4 days    Time spent: 40 minutes   Dessa Phi, DO [facility] [email or web address] Password [email or web address] 08/14/2016, 8:22 AM

## 2016-08-14 NOTE — Progress Notes (Signed)
Patient Name: Courtney Terry Date of Encounter: 08/14/2016  Primary Cardiologist: new, [facility] Problem List     Active Problems:   COPD GOLD IV with reversibility/ still smoking   Chronic respiratory failure with hypercapnia (HCC)   Elevated troponin   Protein-calorie malnutrition, severe   Respiratory failure with hypercapnia (HCC)   Polysubstance abuse   Cocaine abuse     Subjective   55 year old female with a past medical history of breast CA, COPD, anxiety, schizophrenia and Bipolar disorder. Admitted with COPD exacerbation, intubated and admitted to the MICU, troponin elevated, heparin started. Patient extubated on 08/12/16,   She is much more appropriate today . We are able to talk with her  - she was in hysterics yesterday   Echo was done.   Results not available yet   Inpatient Medications    Scheduled Meds: . anastrozole  1 mg Oral Daily  . aspirin  81 mg Oral Daily  . divalproex  500 mg Oral BID  . doxycycline  100 mg Oral Q12H  . famotidine  20 mg Oral BID  . feeding supplement (PRO-STAT SUGAR FREE 64)  30 mL Per Tube BID  . FLUoxetine  20 mg Oral Daily  . ipratropium-albuterol  3 mL Nebulization QID  . levothyroxine  75 mcg Oral QAC breakfast  . mouth rinse  15 mL Mouth Rinse BID  . methylPREDNISolone (SOLU-MEDROL) injection  40 mg Intravenous Q12H   Continuous Infusions: . heparin 1,200 Units/hr (08/14/16 0038)   PRN Meds: sodium chloride, albuterol, guaiFENesin   Vital Signs    Vitals:   08/13/16 1513 08/13/16 2030 08/13/16 2249 08/14/16 0435  BP: (!) 95/50 106/62  104/70  Pulse: 94 88  98  Resp: 17 18  18   Temp: 98.5 F (36.9 C) 98.7 F (37.1 C)  98 F (36.7 C)  TempSrc: Oral Oral  Oral  SpO2: 95% 95% 98% 92%  Weight:    119 lb 4.3 oz (54.1 kg)  Height:        Intake/Output Summary (Last 24 hours) at 08/14/16 1022 Last data filed at 08/14/16 0602  Gross per 24 hour  Intake           876.17 ml  Output                1 ml   Net           875.17 ml   Filed Weights   08/12/16 0422 08/13/16 0111 08/14/16 0435  Weight: 115 lb 1.3 oz (52.2 kg) 117 lb 4.8 oz (53.2 kg) 119 lb 4.3 oz (54.1 kg)    Physical Exam    GEN: thin, cachectic middle age black female HEENT: Grossly normal.  Neck: Supple, no JVD, carotid bruits, or masses. Cardiac: RRR, soft systolic murmur   Respiratory:  Moderately reduced breath sounds GI: Soft, nontender, nondistended, BS + x 4. MS: no deformity or atrophy. Skin: warm and dry, no rash. Neuro:  Strength and sensation are intact. Psych: AAOx3.  Normal affect.  Labs    CBC  Recent Labs  08/13/16 0816 08/14/16 0619  WBC 11.8* 9.9  NEUTROABS 9.0* 8.8*  HGB 12.3 12.2  HCT 38.9 39.7  MCV 83.5 85.6  PLT 199 [id number]   Basic Metabolic Panel  Recent Labs  08/12/16 0726 08/13/16 0816 08/14/16 0619  NA 138  --  143  K 4.4  --  4.0  CL 100*  --  105  CO2 28  --  30  GLUCOSE 76  --  137*  BUN 19  --  16  CREATININE 0.74  --  0.58  CALCIUM 9.0  --  9.0  MG  --  1.7 1.6*  PHOS  --  3.6 3.6   Liver Function Tests  Recent Labs  08/12/16 0447  AST 29  ALT 18  ALKPHOS 87  BILITOT 0.2*  PROT 6.2*  ALBUMIN 3.2*   No results for input(s): LIPASE, AMYLASE in the last 72 hours. Cardiac Enzymes  Recent Labs  08/11/16 1531 08/11/16 2131 08/12/16 0447  TROPONINI 0.49* 0.36* 0.25*   BNP Invalid input(s): POCBNP D-Dimer No results for input(s): DDIMER in the last 72 hours. Hemoglobin A1C No results for input(s): HGBA1C in the last 72 hours. Fasting Lipid Panel  Recent Labs  08/12/16 0447  TRIG 35   Thyroid Function Tests No results for input(s): TSH, T4TOTAL, T3FREE, THYROIDAB in the last 72 hours.  Invalid input(s): FREET3  Telemetry    NSR  - Personally Reviewed  ECG    NSR , ant. Q waves c/w previous ant. MI   - Personally Reviewed  Radiology    No results found.  Cardiac Studies     Patient Profile     55 year old female with a past  medical history of breast CA, COPD, anxiety, schizophrenia and Bipolar disorder. Admitted with COPD exacerbation, intubated and admitted to the MICU, troponin elevated, heparin started.    Assessment & Plan    1.  + Troponin .   Echo images are not yet available. She is not a good candidate for invasive / interventional procedures.   I'm concerned that she would not be compliant with her medications.  Awaiting results of echo    Mertie Moores, MD  08/14/2016, 10:22 AM

## 2016-08-15 DIAGNOSIS — J9601 Acute respiratory failure with hypoxia: Secondary | ICD-10-CM

## 2016-08-15 LAB — CBC WITH DIFFERENTIAL/PLATELET
BASOS ABS: 0 10*3/uL (ref 0.0–0.1)
BASOS PCT: 0 %
EOS ABS: 0.1 10*3/uL (ref 0.0–0.7)
Eosinophils Relative: 1 %
HCT: 39.8 % (ref 36.0–46.0)
HEMOGLOBIN: 12.1 g/dL (ref 12.0–15.0)
Lymphocytes Relative: 42 %
Lymphs Abs: 4.3 10*3/uL — ABNORMAL HIGH (ref 0.7–4.0)
MCH: 26.4 pg (ref 26.0–34.0)
MCHC: 30.4 g/dL (ref 30.0–36.0)
MCV: 86.7 fL (ref 78.0–100.0)
Monocytes Absolute: 0.6 10*3/uL (ref 0.1–1.0)
Monocytes Relative: 6 %
NEUTROS PCT: 51 %
Neutro Abs: 5.3 10*3/uL (ref 1.7–7.7)
Platelets: 216 10*3/uL (ref 150–400)
RBC: 4.59 MIL/uL (ref 3.87–5.11)
RDW: 17.2 % — ABNORMAL HIGH (ref 11.5–15.5)
WBC: 10.4 10*3/uL (ref 4.0–10.5)

## 2016-08-15 LAB — BASIC METABOLIC PANEL
ANION GAP: 3 — AB (ref 5–15)
BUN: 16 mg/dL (ref 6–20)
CO2: 34 mmol/L — ABNORMAL HIGH (ref 22–32)
Calcium: 8.5 mg/dL — ABNORMAL LOW (ref 8.9–10.3)
Chloride: 105 mmol/L (ref 101–111)
Creatinine, Ser: 0.56 mg/dL (ref 0.44–1.00)
GFR calc Af Amer: >60 mL/min (ref >60)
Glucose, Bld: 84 mg/dL (ref 65–99)
POTASSIUM: 3.9 mmol/L (ref 3.5–5.1)
SODIUM: 142 mmol/L (ref 135–145)

## 2016-08-15 LAB — GLUCOSE, CAPILLARY
GLUCOSE-CAPILLARY: 114 mg/dL — AB (ref 65–99)
Glucose-Capillary: 84 mg/dL (ref 65–99)

## 2016-08-15 LAB — HEPARIN LEVEL (UNFRACTIONATED): HEPARIN UNFRACTIONATED: 0.35 [IU]/mL (ref 0.30–0.70)

## 2016-08-15 LAB — MAGNESIUM: MAGNESIUM: 1.7 mg/dL (ref 1.7–2.4)

## 2016-08-15 LAB — PHOSPHORUS: Phosphorus: 4.4 mg/dL (ref 2.5–4.6)

## 2016-08-15 MED ORDER — PREDNISONE 10 MG PO TABS: ORAL_TABLET | ORAL | 0 refills | Status: DC

## 2016-08-15 NOTE — Evaluation (Signed)
Physical Therapy Evaluation Patient Details Name: Courtney Terry MRN: [id number] DOB: 1961-08-22 Today's Date: 08/15/2016   History of Present Illness  Courtney Terry is a 55 y.o. female with h/o severe COPD, schizophrenia, bipolar d/o, PTSD, who presented to the ED in respiratory distress  Clinical Impression  Pt is at or close to baseline functioning and should be safe at home with available assist. There are no further acute PT needs.  Will sign off at this time.     Follow Up Recommendations No PT follow up    Equipment Recommendations  None recommended by PT    Recommendations for Other Services       Precautions / Restrictions Precautions Precautions: Fall      Mobility  Bed Mobility Overal bed mobility: Modified Independent                Transfers Overall transfer level: Modified independent                  Ambulation/Gait Ambulation/Gait assistance: Supervision Ambulation Distance (Feet): 70 Feet Assistive device: None (pulling the portable oxygen) Gait Pattern/deviations: Step-through pattern   Gait velocity interpretation: at or above normal speed for age/gender General Gait Details: generally steady and managing the oxygen.  Pt and friend happy with patient's stability.  No SOB/dyspnea  Stairs            Wheelchair Mobility    Modified Rankin (Stroke Patients Only)       Balance Overall balance assessment: Needs assistance Sitting-balance support: No upper extremity supported Sitting balance-Leahy Scale: Good     Standing balance support: No upper extremity supported Standing balance-Leahy Scale: Fair (to good)                               Pertinent Vitals/Pain Pain Assessment: No/denies pain    Home Living Family/patient expects to be discharged to:: Private residence Living Arrangements: Alone;Non-relatives/Friends (room mate) Available Help at Discharge: Other (Comment) (room mate) Type of Home:  Apartment Home Access: Stairs to enter Entrance Stairs-Rails: None Entrance Stairs-Number of Steps: 3 Home Layout: One level Home Equipment: None Additional Comments: Has O2 equipment at home    Prior Function Level of Independence: Needs assistance   Gait / Transfers Assistance Needed: independent  ADL's / Homemaking Assistance Needed: needs assistance with IADL and PCA assists with dressing        Hand Dominance   Dominant Hand: Right    Extremity/Trunk Assessment   Upper Extremity Assessment: Overall WFL for tasks assessed           Lower Extremity Assessment: Overall WFL for tasks assessed (mild weakness proximally R > L)      Cervical / Trunk Assessment: Normal  Communication   Communication: No difficulties  Cognition Arousal/Alertness: Awake/alert Behavior During Therapy: Restless Overall Cognitive Status: Within Functional Limits for tasks assessed                      General Comments      Exercises     Assessment/Plan    PT Assessment Patient needs continued PT services  PT Problem List Decreased activity tolerance;Cardiopulmonary status limiting activity          PT Treatment Interventions      PT Goals (Current goals can be found in the Care Plan section)  Acute Rehab PT Goals PT Goal Formulation: All assessment and education complete, DC therapy  Frequency     Barriers to discharge        Co-evaluation               End of Session Equipment Utilized During Treatment: Oxygen Activity Tolerance: Patient tolerated treatment well Patient left: in bed (sitting EOB) Nurse Communication: Mobility status         Time: [id number] PT Time Calculation (min) (ACUTE ONLY): 16 min   Charges:   PT Evaluation $PT Eval Low Complexity: 1 Procedure     PT G Codes:        Lynsi Dooner, Tessie Fass 08/15/2016, 10:26 AM 08/15/2016  Donnella Sham, [place] [phone]  (pager)

## 2016-08-15 NOTE — Progress Notes (Signed)
Discussed with pt PCP, states she has a PCP but does not recall his name. Please see previous NCM notes. Jonnie Finner RN CCM Case Mgmt phone [phone]

## 2016-08-15 NOTE — Progress Notes (Signed)
Pt given discharge instructions, prescriptions, and care notes. Pt verbalized understanding AEB no further questions or concerns at this time. IV was discontinued, no redness, pain, or swelling noted at this time. Telemetry discontinued and Centralized Telemetry was notified. Pt left the floor via wheelchair with staff in stable condition. 

## 2016-08-15 NOTE — Discharge Summary (Signed)
Physician Discharge Summary  Courtney Terry [id number] DOB: [birth year] DOA: 08/10/2016  PCP: Pcp Not In System  Admit date: 08/10/2016 Discharge date: 08/15/2016  Admitted From: Home Disposition:  Home  Recommendations for Outpatient Follow-up:  1. Follow up with PCP in 1-2 weeks 2. Recommended to stop all illicit drug abuse, tobacco abuse, and alcohol use.   Home Health: No  Equipment/Devices: None   Discharge Condition: Stable CODE STATUS: Full  Diet recommendation: Heart healthy   Brief/Interim Summary: From H&P: Courtney Terry is a 55 year old female with history of severe COPD FEV1 of 23% predicted presented to the emergency room with increasing shortness of breath she was found to be in acute hypoxic and hypercarbic respiratory failure with severe respiratory acidosis and intubated since the emergency room. She was admitted to the ICU, treated for COPD exacerbation with antibiotics, steroids, nebs and started on IV heparin for NSTEMI versus demand ischemia. She was extubated 11/6 and transferred to the floor to [facility] 11/7. Due to elevated troponin, cardiology was consulted and she underwent echocardiogram which showed normal LV function grade 1 diastolic dysfunction, no segmental wall motion abnormalities. It was felt that her troponin elevation was likely due to cocaine abuse.   Subjective on day of discharge: Feeling well today. Denies any chest pain, shortness of breath. Tolerating meals and ambulating. She declined to meet with SW today; states she has a Social worker who she will see upon discharge.   Discharge Diagnoses:  Principal Problem:   Acute respiratory failure with hypoxia (HCC) Active Problems:   Cigarette smoker   COPD exacerbation (HCC)   History of breast cancer   Elevated troponin   Protein-calorie malnutrition, severe   Respiratory failure with hypercapnia (HCC)   Polysubstance abuse   Cocaine abuse    Acute hypoxic and hypercarbic  respiratory failure -Secondary to COPD exacerbation/polysubstance abuse, extubated 11/6 -Stop Tamiflu, flu PCR negative -Course of antibiotics completed  -Will discharge with prednisone taper   Elevated troponin secondary to cocaine abuse  -Troponin peak at 0.52  -Cardiology consulted  -Continue aspirin  -Echo: normal LV function grade 1 diastolic dysfunction, no segmental wall motion abnormalities  Hypothyroidism -Continue synthroid   History of breast cancer  -Continue Arimidex   Polysubstance abuse including cocaine and tobacco -Counseled on cessation numerous times -SW consulted   Severe protein calorie malnutrition -Supplements as tolerated  History of bipolar disorder/schizophrenia -Continue depakote, unclear if she is on this for seizures or mood/bipolar disorder -Continue prozac   ? Seizure disorder -Continue depakote, unclear if she is on this for seizures or mood/bipolar disorder  Discharge Instructions  Discharge Instructions    Diet - low sodium heart healthy    Complete by:  As directed    Discharge instructions    Complete by:  As directed    Recommendations for Outpatient Follow-up:  Follow up with PCP in 1-2 weeks Recommended to stop all illicit drug abuse, tobacco abuse, and alcohol use.   Increase activity slowly    Complete by:  As directed        Medication List    STOP taking these medications   ENSURE PLUS Liqd     TAKE these medications   albuterol 108 (90 Base) MCG/ACT inhaler Commonly known as:  PROAIR HFA Up to 2pffs every 4 hours if can't catch your breath What changed:  how much to take  how to take this  when to take this  reasons to take this  additional instructions  albuterol (2.5 MG/3ML) 0.083% nebulizer solution Commonly known as:  PROVENTIL TAKE 3 MLS BY NEBULIZATION EVERY 4 HOURS AS NEEDED FOR WHEEZING OR SHORTNESS OF BREATH What changed:  Another medication with the same name was changed. Make sure  you understand how and when to take each.   anastrozole 1 MG tablet Commonly known as:  ARIMIDEX TAKE 1 TABLET BY MOUTH DAILY   diphenhydrAMINE 50 MG capsule Commonly known as:  BENADRYL Take 50 mg by mouth at bedtime.   divalproex 500 MG DR tablet Commonly known as:  DEPAKOTE Take 500 mg by mouth 2 (two) times daily.   FLUoxetine 20 MG capsule Commonly known as:  PROZAC Take 20 mg by mouth daily.   Glycopyrrolate-Formoterol 9-4.8 MCG/ACT Aero Commonly known as:  BEVESPI AEROSPHERE Inhale 2 puffs into the lungs 2 (two) times daily.   levothyroxine 75 MCG tablet Commonly known as:  SYNTHROID, LEVOTHROID Take 75 mcg by mouth daily.   OXYGEN Inhale 2 L into the lungs continuous.   paliperidone 9 MG 24 hr tablet Commonly known as:  INVEGA Take 9 mg by mouth at bedtime.   predniSONE 10 MG tablet Commonly known as:  DELTASONE Take 4 tabs for 3 days, then 3 tabs for 3 days, then 2 tabs for 3 days, then 1 tab for 3 days, then 1/2 tab for 4 days.      Follow-up Information    PCP at [facility]. Schedule an appointment as soon as possible for a visit in 1 week(s).          Allergies  Allergen Reactions  . Levaquin [Levofloxacin] Hives    Consultations:  Cardiology  PCCM    Procedures/Studies: Dg Chest Port 1 View  Result Date: 08/12/2016 CLINICAL DATA:  Hypoxia EXAM: PORTABLE CHEST 1 VIEW COMPARISON:  August 11, 2016 FINDINGS: Endotracheal tube tip is 5.6 cm above the carina. Nasogastric tube tip and side port are below the diaphragm. No pneumothorax. There is atelectasis in the right lung base. Lungs elsewhere are clear. Heart size and pulmonary vascularity are normal. No adenopathy. Patient is status post right mastectomy with surgical clips in the right axillary region. IMPRESSION: Tube positions as described without pneumothorax. Right base atelectasis. Lungs elsewhere clear. Electronically Signed   By: Lowella Grip III M.D.   On: 08/12/2016  08:22   Dg Chest Port 1 View  Result Date: 08/11/2016 CLINICAL DATA:  Endotracheal tube advance. EXAM: PORTABLE CHEST 1 VIEW COMPARISON:  Earlier today at 933 hours. FINDINGS: 1943 hours. Endotracheal tube terminates 5.4 cm above carina.Nasogastric tube extends beyond the inferior aspect of the film. Hyperinflation. Right axillary node dissection. Soft tissue expander in the right chest. Normal heart size. No pleural effusion or pneumothorax. Right base atelectasis laterally. Numerous leads and wires project over the chest. IMPRESSION: Endotracheal tube appropriately positioned. Hyperinflation, without acute disease. Electronically Signed   By: Abigail Miyamoto M.D.   On: 08/11/2016 15:07   Dg Chest Port 1 View  Result Date: 08/11/2016 CLINICAL DATA:  Intubation EXAM: PORTABLE CHEST 1 VIEW COMPARISON:  Portable exam 0933 hours compared to 08/10/2016 studies FINDINGS: Tip of endotracheal tube projects 5.8 cm above carina. Nasogastric tube extends into stomach. LEFT breast prosthesis and RIGHT breast tissue expander noted. Normal heart size, mediastinal contours, and pulmonary vascularity. Lungs appear emphysematous with subsegmental atelectasis at RIGHT base new since previous exam. Nodular density projects over lateral LEFT mid lung, projected external to the costal margin within breast soft tissue on a previous exam.  Surgical clips at RIGHT axilla. Bones demineralized. IMPRESSION: COPD changes with new subsegmental atelectasis at RIGHT base. Electronically Signed   By: Lavonia Dana M.D.   On: 08/11/2016 10:00   Dg Chest Portable 1 View  Result Date: 08/10/2016 CLINICAL DATA:  Endotracheal tube placement.  Initial encounter. EXAM: PORTABLE CHEST 1 VIEW COMPARISON:  Chest radiograph performed earlier today at 8:16 p.m. FINDINGS: The patient's endotracheal tube is seen ending 6-7 cm above the carina. This could be advanced 3 cm. The enteric tube is noted extending below the diaphragm. The lungs are  hyperexpanded, with flattening of the hemidiaphragms, compatible with COPD. There is no evidence of focal opacification, pleural effusion or pneumothorax. The cardiomediastinal silhouette is within normal limits. No acute osseous abnormalities are seen. IMPRESSION: 1. Endotracheal tube seen ending 6-7 cm above the carina. This could be advanced 3 cm. 2. Findings of COPD.  Lungs otherwise grossly clear. Electronically Signed   By: Garald Balding M.D.   On: 08/10/2016 21:16   Dg Chest Port 1 View  Result Date: 08/10/2016 CLINICAL DATA:  55 year old female with a history of shortness of breath EXAM: PORTABLE CHEST 1 VIEW COMPARISON:  Multiple prior chest x-ray most recent 02/25/2016, 07/01/2014 FINDINGS: Cardiomediastinal silhouette unchanged in size and contour. Changes of emphysema with hyper expansion. Surgical changes of the right axilla, with interval placement of a right chest wall tissue expander. Changes of prior left-sided breast reconstruction. No confluent airspace disease or pneumothorax. No pleural effusion. Chronic lung changes again noted. IMPRESSION: Chronic lung changes with emphysema and no evidence of superimposed acute cardiopulmonary disease. Interval placement of tissue expander on the right chest wall with surgical changes of the right axilla. Signed, Dulcy Fanny. Earleen Newport, DO [facility] Electronically Signed   By: Corrie Mckusick D.O.   On: 08/10/2016 20:49    Echo Study Conclusions - Left ventricle: The cavity size was normal. Systolic function was   normal. The estimated ejection fraction was in the range of 55%   to 60%. Wall motion was normal; there were no regional wall   motion abnormalities. Doppler parameters are consistent with   abnormal left ventricular relaxation (grade 1 diastolic   dysfunction). Doppler parameters are consistent with   indeterminate ventricular filling pressure. - Aortic valve: Transvalvular  velocity was within the normal range.   There was no stenosis. There was no regurgitation. - Mitral valve: Transvalvular velocity was within the normal range.   There was no evidence for stenosis. There was no regurgitation. - Right ventricle: The cavity size was normal. Wall thickness was   normal. Systolic function was normal. - Tricuspid valve: There was trivial regurgitation. - Pulmonary arteries: Systolic pressure was within the normal   range. PA peak pressure: 32 mm Hg (S).   Discharge Exam: Vitals:   08/14/16 2114 08/15/16 0515  BP: 102/61 100/68  Pulse: 91 81  Resp:  18  Temp:     Vitals:   08/14/16 2114 08/15/16 0411 08/15/16 0515 08/15/16 0756  BP: 102/61  100/68   Pulse: 91  81   Resp:   18   Temp:      TempSrc:      SpO2:   99% 98%  Weight:  54.8 kg (120 lb 14.4 oz)    Height:        General: Pt is alert, awake, not in acute distress Cardiovascular: RRR, S1/S2 +, no rubs, no gallops Respiratory: CTA bilaterally, no wheezing, no rhonchi Abdominal: Soft,  NT, ND, bowel sounds + Extremities: no edema, no cyanosis    The results of significant diagnostics from this hospitalization (including imaging, microbiology, ancillary and laboratory) are listed below for reference.     Microbiology: Recent Results (from the past 240 hour(s))  MRSA PCR Screening     Status: None   Collection Time: 08/11/16  1:10 AM  Result Value Ref Range Status   MRSA by PCR NEGATIVE NEGATIVE Final    Comment:        The GeneXpert MRSA Assay (FDA approved for NASAL specimens only), is one component of a comprehensive MRSA colonization surveillance program. It is not intended to diagnose MRSA infection nor to guide or monitor treatment for MRSA infections.   Respiratory virus panel     Status: None   Collection Time: 08/11/16  9:59 AM  Result Value Ref Range Status   Source - RVPAN TRACHEAL ASPIRATE  Final   Respiratory Syncytial Virus A Negative Negative Final    Respiratory Syncytial Virus B Negative Negative Final   Influenza A Negative Negative Final   Influenza B Negative Negative Final   Parainfluenza 1 Negative Negative Final   Parainfluenza 2 Negative Negative Final   Parainfluenza 3 Negative Negative Final   Metapneumovirus Negative Negative Final   Rhinovirus Negative Negative Final   Adenovirus Negative Negative Final    Comment: (NOTE) Performed At: [facility] [place], [place] [id number] Lindon Romp MD [id number]   Respiratory Panel by PCR     Status: None   Collection Time: 08/12/16 10:09 AM  Result Value Ref Range Status   Adenovirus NOT DETECTED NOT DETECTED Final   Coronavirus 229E NOT DETECTED NOT DETECTED Final   Coronavirus HKU1 NOT DETECTED NOT DETECTED Final   Coronavirus NL63 NOT DETECTED NOT DETECTED Final   Coronavirus OC43 NOT DETECTED NOT DETECTED Final   Metapneumovirus NOT DETECTED NOT DETECTED Final   Rhinovirus / Enterovirus NOT DETECTED NOT DETECTED Final   Influenza A NOT DETECTED NOT DETECTED Final   Influenza B NOT DETECTED NOT DETECTED Final   Parainfluenza Virus 1 NOT DETECTED NOT DETECTED Final   Parainfluenza Virus 2 NOT DETECTED NOT DETECTED Final   Parainfluenza Virus 3 NOT DETECTED NOT DETECTED Final   Parainfluenza Virus 4 NOT DETECTED NOT DETECTED Final   Respiratory Syncytial Virus NOT DETECTED NOT DETECTED Final   Bordetella pertussis NOT DETECTED NOT DETECTED Final   Chlamydophila pneumoniae NOT DETECTED NOT DETECTED Final   Mycoplasma pneumoniae NOT DETECTED NOT DETECTED Final     Labs: BNP (last 3 results)  Recent Labs  08/10/16 2005  BNP [id number]   Basic Metabolic Panel:  Recent Labs Lab 08/11/16 0256 08/11/16 1531 08/12/16 0447 08/12/16 0726 08/13/16 0816 08/14/16 0619 08/15/16 0441  NA 140  --  138 138  --  143 142  K 4.9  --  4.4 4.4  --  4.0 3.9  CL 100*  --  99* 100*  --  105 105  CO2 31  --  31 28  --  30 34*  GLUCOSE 121*  --  94 76   --  137* 84  BUN 14  --  19 19  --  16 16  CREATININE 0.94  0.92  --  0.62 0.74  --  0.58 0.56  CALCIUM 8.7*  --  9.1 9.0  --  9.0 8.5*  MG 2.2 2.0 2.1  --  1.7 1.6* 1.7  PHOS 4.8* 3.0 3.8  --  3.6 3.6 4.4   Liver Function Tests:  Recent Labs Lab 08/12/16 0447  AST 29  ALT 18  ALKPHOS 87  BILITOT 0.2*  PROT 6.2*  ALBUMIN 3.2*   No results for input(s): LIPASE, AMYLASE in the last 168 hours. No results for input(s): AMMONIA in the last 168 hours. CBC:  Recent Labs Lab 08/10/16 2005 08/11/16 0256 08/12/16 0447 08/13/16 0816 08/14/16 0619 08/15/16 0441  WBC 16.8* 15.7*  16.3* 16.3* 11.8* 9.9 10.4  NEUTROABS 10.6*  --  12.7* 9.0* 8.8* 5.3  HGB 15.0 13.9  13.8 13.4 12.3 12.2 12.1  HCT 47.0* 44.4  44.4 42.6 38.9 39.7 39.8  MCV 84.8 85.2  84.9 83.7 83.5 85.6 86.7  PLT 274 243  250 222 199 221 216   Cardiac Enzymes:  Recent Labs Lab 08/11/16 0950 08/11/16 1531 08/11/16 2131 08/12/16 0447  TROPONINI 0.52* 0.49* 0.36* 0.25*   BNP: Invalid input(s): POCBNP CBG:  Recent Labs Lab 08/14/16 1638 08/14/16 2110 08/15/16 0000 08/15/16 0406 08/15/16 0822  GLUCAP 111* 103* 92 114* 84   D-Dimer No results for input(s): DDIMER in the last 72 hours. Hgb A1c No results for input(s): HGBA1C in the last 72 hours. Lipid Profile No results for input(s): CHOL, HDL, LDLCALC, TRIG, CHOLHDL, LDLDIRECT in the last 72 hours. Thyroid function studies No results for input(s): TSH, T4TOTAL, T3FREE, THYROIDAB in the last 72 hours.  Invalid input(s): FREET3 Anemia work up No results for input(s): VITAMINB12, FOLATE, FERRITIN, TIBC, IRON, RETICCTPCT in the last 72 hours. Urinalysis    Component Value Date/Time   COLORURINE YELLOW 08/11/2016 0318   APPEARANCEUR CLOUDY (A) 08/11/2016 0318   LABSPEC 1.018 08/11/2016 0318   PHURINE 5.5 08/11/2016 0318   GLUCOSEU NEGATIVE 08/11/2016 0318   HGBUR NEGATIVE 08/11/2016 0318   BILIRUBINUR NEGATIVE 08/11/2016 0318   KETONESUR  NEGATIVE 08/11/2016 0318   PROTEINUR 30 (A) 08/11/2016 0318   UROBILINOGEN 0.2 03/06/2014 1921   NITRITE NEGATIVE 08/11/2016 0318   LEUKOCYTESUR NEGATIVE 08/11/2016 0318   Sepsis Labs Invalid input(s): PROCALCITONIN,  WBC,  LACTICIDVEN Microbiology Recent Results (from the past 240 hour(s))  MRSA PCR Screening     Status: None   Collection Time: 08/11/16  1:10 AM  Result Value Ref Range Status   MRSA by PCR NEGATIVE NEGATIVE Final    Comment:        The GeneXpert MRSA Assay (FDA approved for NASAL specimens only), is one component of a comprehensive MRSA colonization surveillance program. It is not intended to diagnose MRSA infection nor to guide or monitor treatment for MRSA infections.   Respiratory virus panel     Status: None   Collection Time: 08/11/16  9:59 AM  Result Value Ref Range Status   Source - RVPAN TRACHEAL ASPIRATE  Final   Respiratory Syncytial Virus A Negative Negative Final   Respiratory Syncytial Virus B Negative Negative Final   Influenza A Negative Negative Final   Influenza B Negative Negative Final   Parainfluenza 1 Negative Negative Final   Parainfluenza 2 Negative Negative Final   Parainfluenza 3 Negative Negative Final   Metapneumovirus Negative Negative Final   Rhinovirus Negative Negative Final   Adenovirus Negative Negative Final    Comment: (NOTE) Performed At: [facility] [place], [place] [id number] Lindon Romp MD [id number]   Respiratory Panel by PCR     Status: None   Collection Time: 08/12/16 10:09 AM  Result Value Ref Range Status   Adenovirus NOT DETECTED NOT  DETECTED Final   Coronavirus 229E NOT DETECTED NOT DETECTED Final   Coronavirus HKU1 NOT DETECTED NOT DETECTED Final   Coronavirus NL63 NOT DETECTED NOT DETECTED Final   Coronavirus OC43 NOT DETECTED NOT DETECTED Final   Metapneumovirus NOT DETECTED NOT DETECTED Final   Rhinovirus / Enterovirus NOT DETECTED NOT DETECTED Final   Influenza A  NOT DETECTED NOT DETECTED Final   Influenza B NOT DETECTED NOT DETECTED Final   Parainfluenza Virus 1 NOT DETECTED NOT DETECTED Final   Parainfluenza Virus 2 NOT DETECTED NOT DETECTED Final   Parainfluenza Virus 3 NOT DETECTED NOT DETECTED Final   Parainfluenza Virus 4 NOT DETECTED NOT DETECTED Final   Respiratory Syncytial Virus NOT DETECTED NOT DETECTED Final   Bordetella pertussis NOT DETECTED NOT DETECTED Final   Chlamydophila pneumoniae NOT DETECTED NOT DETECTED Final   Mycoplasma pneumoniae NOT DETECTED NOT DETECTED Final     Time coordinating discharge: Over 30 minutes  SIGNED:  Dessa Phi, DO [facility] Pager [phone]  If 7PM-7AM, please contact night-coverage [email or web address] Password [email or web address] 08/15/2016, 9:28 AM

## 2016-08-15 NOTE — Progress Notes (Signed)
     Echo shows normal LV function grade 1 diastolic dysfunction No segmental wall motion abn.  Her troponin elevation was due to cocaine.  No indication for further evaluation  Will sign off. She will follow up with her medical doctor.   She does not need to follow up with cardiology     Mertie Moores, MD  08/15/2016 6:32 AM    [address]  [address] [place], [place]  [address] Pager [phone] Phone: [phone]; Fax: [phone]

## 2016-08-23 ENCOUNTER — Other Ambulatory Visit: Payer: Self-pay

## 2016-08-23 ENCOUNTER — Encounter: Payer: Self-pay | Admitting: Hematology

## 2016-08-23 NOTE — Progress Notes (Signed)
No show  This encounter was created in error - please disregard.

## 2016-08-27 ENCOUNTER — Telehealth: Payer: Self-pay | Admitting: Hematology

## 2016-08-27 NOTE — Telephone Encounter (Signed)
lvm for pt to return call to office to r/s missed appts on 11/17. Per LOS to r/s only if pt wants to

## 2016-09-28 ENCOUNTER — Inpatient Hospital Stay (HOSPITAL_COMMUNITY)
Admission: EM | Admit: 2016-09-28 | Discharge: 2016-10-09 | DRG: 190 | Disposition: A | Discharge status: Home Health | Payer: Medicaid Other | Attending: Internal Medicine | Admitting: Internal Medicine

## 2016-09-28 ENCOUNTER — Encounter (HOSPITAL_COMMUNITY): Payer: Self-pay

## 2016-09-28 ENCOUNTER — Emergency Department (HOSPITAL_COMMUNITY): Payer: Medicaid Other

## 2016-09-28 DIAGNOSIS — Z888 Allergy status to other drugs, medicaments and biological substances status: Secondary | ICD-10-CM

## 2016-09-28 DIAGNOSIS — C50411 Malignant neoplasm of upper-outer quadrant of right female breast: Secondary | ICD-10-CM

## 2016-09-28 DIAGNOSIS — Z66 Do not resuscitate: Secondary | ICD-10-CM | POA: Diagnosis present

## 2016-09-28 DIAGNOSIS — F209 Schizophrenia, unspecified: Secondary | ICD-10-CM | POA: Diagnosis present

## 2016-09-28 DIAGNOSIS — F1410 Cocaine abuse, uncomplicated: Secondary | ICD-10-CM | POA: Diagnosis present

## 2016-09-28 DIAGNOSIS — E872 Acidosis: Secondary | ICD-10-CM | POA: Diagnosis not present

## 2016-09-28 DIAGNOSIS — Z7189 Other specified counseling: Secondary | ICD-10-CM

## 2016-09-28 DIAGNOSIS — F4310 Post-traumatic stress disorder, unspecified: Secondary | ICD-10-CM | POA: Diagnosis present

## 2016-09-28 DIAGNOSIS — J441 Chronic obstructive pulmonary disease with (acute) exacerbation: Secondary | ICD-10-CM | POA: Diagnosis present

## 2016-09-28 DIAGNOSIS — J9622 Acute and chronic respiratory failure with hypercapnia: Secondary | ICD-10-CM | POA: Diagnosis present

## 2016-09-28 DIAGNOSIS — Z9119 Patient's noncompliance with other medical treatment and regimen: Secondary | ICD-10-CM | POA: Diagnosis not present

## 2016-09-28 DIAGNOSIS — E43 Unspecified severe protein-calorie malnutrition: Secondary | ICD-10-CM | POA: Diagnosis present

## 2016-09-28 DIAGNOSIS — E8729 Other acidosis: Secondary | ICD-10-CM

## 2016-09-28 DIAGNOSIS — Z681 Body mass index (BMI) 19 or less, adult: Secondary | ICD-10-CM | POA: Diagnosis not present

## 2016-09-28 DIAGNOSIS — E039 Hypothyroidism, unspecified: Secondary | ICD-10-CM | POA: Diagnosis present

## 2016-09-28 DIAGNOSIS — E875 Hyperkalemia: Secondary | ICD-10-CM | POA: Diagnosis present

## 2016-09-28 DIAGNOSIS — J9602 Acute respiratory failure with hypercapnia: Secondary | ICD-10-CM

## 2016-09-28 DIAGNOSIS — Z9981 Dependence on supplemental oxygen: Secondary | ICD-10-CM

## 2016-09-28 DIAGNOSIS — G9340 Encephalopathy, unspecified: Secondary | ICD-10-CM

## 2016-09-28 DIAGNOSIS — I10 Essential (primary) hypertension: Secondary | ICD-10-CM | POA: Diagnosis present

## 2016-09-28 DIAGNOSIS — Z720 Tobacco use: Secondary | ICD-10-CM | POA: Diagnosis not present

## 2016-09-28 DIAGNOSIS — F319 Bipolar disorder, unspecified: Secondary | ICD-10-CM | POA: Diagnosis present

## 2016-09-28 DIAGNOSIS — Z853 Personal history of malignant neoplasm of breast: Secondary | ICD-10-CM

## 2016-09-28 DIAGNOSIS — Z79899 Other long term (current) drug therapy: Secondary | ICD-10-CM

## 2016-09-28 DIAGNOSIS — J449 Chronic obstructive pulmonary disease, unspecified: Secondary | ICD-10-CM | POA: Diagnosis present

## 2016-09-28 DIAGNOSIS — Z515 Encounter for palliative care: Secondary | ICD-10-CM | POA: Diagnosis present

## 2016-09-28 DIAGNOSIS — B9789 Other viral agents as the cause of diseases classified elsewhere: Secondary | ICD-10-CM | POA: Diagnosis present

## 2016-09-28 DIAGNOSIS — C50419 Malignant neoplasm of upper-outer quadrant of unspecified female breast: Secondary | ICD-10-CM | POA: Diagnosis present

## 2016-09-28 DIAGNOSIS — G40909 Epilepsy, unspecified, not intractable, without status epilepticus: Secondary | ICD-10-CM | POA: Diagnosis present

## 2016-09-28 DIAGNOSIS — Z803 Family history of malignant neoplasm of breast: Secondary | ICD-10-CM

## 2016-09-28 DIAGNOSIS — J9621 Acute and chronic respiratory failure with hypoxia: Secondary | ICD-10-CM | POA: Diagnosis present

## 2016-09-28 DIAGNOSIS — B348 Other viral infections of unspecified site: Secondary | ICD-10-CM | POA: Diagnosis present

## 2016-09-28 DIAGNOSIS — F419 Anxiety disorder, unspecified: Secondary | ICD-10-CM | POA: Diagnosis present

## 2016-09-28 DIAGNOSIS — R0902 Hypoxemia: Secondary | ICD-10-CM

## 2016-09-28 DIAGNOSIS — F17210 Nicotine dependence, cigarettes, uncomplicated: Secondary | ICD-10-CM | POA: Diagnosis present

## 2016-09-28 LAB — BASIC METABOLIC PANEL
Anion gap: 8 (ref 5–15)
BUN: 12 mg/dL (ref 6–20)
CHLORIDE: 101 mmol/L (ref 101–111)
CO2: 28 mmol/L (ref 22–32)
Calcium: 8.6 mg/dL — ABNORMAL LOW (ref 8.9–10.3)
Creatinine, Ser: 0.59 mg/dL (ref 0.44–1.00)
GLUCOSE: 119 mg/dL — AB (ref 65–99)
Potassium: 3.9 mmol/L (ref 3.5–5.1)
SODIUM: 137 mmol/L (ref 135–145)

## 2016-09-28 LAB — CBC WITH DIFFERENTIAL/PLATELET
BASOS ABS: 0 10*3/uL (ref 0.0–0.1)
Basophils Relative: 0 %
Eosinophils Absolute: 0 10*3/uL (ref 0.0–0.7)
Eosinophils Relative: 0 %
HEMATOCRIT: 40.2 % (ref 36.0–46.0)
Hemoglobin: 12.9 g/dL (ref 12.0–15.0)
LYMPHS ABS: 0.4 10*3/uL — AB (ref 0.7–4.0)
LYMPHS PCT: 4 %
MCH: 27 pg (ref 26.0–34.0)
MCHC: 32.1 g/dL (ref 30.0–36.0)
MCV: 84.3 fL (ref 78.0–100.0)
MONO ABS: 0.3 10*3/uL (ref 0.1–1.0)
Monocytes Relative: 3 %
NEUTROS ABS: 9.1 10*3/uL — AB (ref 1.7–7.7)
Neutrophils Relative %: 93 %
Platelets: 250 10*3/uL (ref 150–400)
RBC: 4.77 MIL/uL (ref 3.87–5.11)
RDW: 16.5 % — ABNORMAL HIGH (ref 11.5–15.5)
WBC: 9.8 10*3/uL (ref 4.0–10.5)

## 2016-09-28 LAB — BLOOD GAS, ARTERIAL
Acid-Base Excess: 2 mmol/L (ref 0.0–2.0)
Bicarbonate: 29.9 mmol/L — ABNORMAL HIGH (ref 20.0–28.0)
DRAWN BY: 295031
O2 CONTENT: 4 L/min
O2 SAT: 91.5 %
PATIENT TEMPERATURE: 98.6
PO2 ART: 67.1 mmHg — AB (ref 83.0–108.0)
pCO2 arterial: 64.3 mmHg — ABNORMAL HIGH (ref 32.0–48.0)
pH, Arterial: 7.289 — ABNORMAL LOW (ref 7.350–7.450)

## 2016-09-28 LAB — GLUCOSE, CAPILLARY: Glucose-Capillary: 129 mg/dL — ABNORMAL HIGH (ref 65–99)

## 2016-09-28 LAB — I-STAT TROPONIN, ED: Troponin i, poc: 0 ng/mL (ref 0.00–0.08)

## 2016-09-28 LAB — MRSA PCR SCREENING: MRSA by PCR: NEGATIVE

## 2016-09-28 MED ORDER — ONDANSETRON HCL 4 MG/2ML IJ SOLN: 4.0000 mg | Freq: Four times a day (QID) | INTRAMUSCULAR | Status: DC | PRN

## 2016-09-28 MED ORDER — ALBUTEROL SULFATE (2.5 MG/3ML) 0.083% IN NEBU
5.0000 mg | INHALATION_SOLUTION | Freq: Once | RESPIRATORY_TRACT | Status: AC
  Administered 2016-09-28: 5 mg via RESPIRATORY_TRACT
  Filled 2016-09-28: qty 6

## 2016-09-28 MED ORDER — SODIUM CHLORIDE 0.9 % IV SOLN
INTRAVENOUS | Status: DC
  Administered 2016-09-28 – 2016-09-29 (×3): via INTRAVENOUS
  Administered 2016-09-30: 1000 mL via INTRAVENOUS

## 2016-09-28 MED ORDER — ANASTROZOLE 1 MG PO TABS
1.0000 mg | ORAL_TABLET | Freq: Every day | ORAL | Status: DC
  Administered 2016-09-29 – 2016-10-09 (×10): 1 mg via ORAL
  Filled 2016-09-28 (×12): qty 1

## 2016-09-28 MED ORDER — ONDANSETRON HCL 4 MG PO TABS: 4.0000 mg | ORAL_TABLET | Freq: Four times a day (QID) | ORAL | Status: DC | PRN

## 2016-09-28 MED ORDER — ACETAMINOPHEN 650 MG RE SUPP: 650.0000 mg | Freq: Four times a day (QID) | RECTAL | Status: DC | PRN

## 2016-09-28 MED ORDER — LEVOTHYROXINE SODIUM 75 MCG PO TABS
75.0000 ug | ORAL_TABLET | Freq: Every day | ORAL | Status: DC
  Administered 2016-09-30: 75 ug via ORAL
  Filled 2016-09-28 (×2): qty 1

## 2016-09-28 MED ORDER — ALBUTEROL (5 MG/ML) CONTINUOUS INHALATION SOLN
10.0000 mg/h | INHALATION_SOLUTION | RESPIRATORY_TRACT | Status: DC
  Administered 2016-09-28: 10 mg/h via RESPIRATORY_TRACT
  Filled 2016-09-28: qty 20

## 2016-09-28 MED ORDER — HEPARIN SODIUM (PORCINE) 5000 UNIT/ML IJ SOLN
5000.0000 [IU] | Freq: Three times a day (TID) | INTRAMUSCULAR | Status: DC
  Administered 2016-09-28 – 2016-10-09 (×32): 5000 [IU] via SUBCUTANEOUS
  Filled 2016-09-28 (×32): qty 1

## 2016-09-28 MED ORDER — IPRATROPIUM-ALBUTEROL 0.5-2.5 (3) MG/3ML IN SOLN: 3.0000 mL | RESPIRATORY_TRACT | Status: DC | PRN

## 2016-09-28 MED ORDER — DIVALPROEX SODIUM 500 MG PO DR TAB
500.0000 mg | DELAYED_RELEASE_TABLET | Freq: Two times a day (BID) | ORAL | Status: DC
  Filled 2016-09-28 (×2): qty 1
  Filled 2016-09-28 (×2): qty 2
  Filled 2016-09-28 (×3): qty 1

## 2016-09-28 MED ORDER — PALIPERIDONE ER 6 MG PO TB24
9.0000 mg | ORAL_TABLET | Freq: Every day | ORAL | Status: DC
  Administered 2016-09-28 – 2016-10-08 (×9): 9 mg via ORAL
  Filled 2016-09-28 (×11): qty 1

## 2016-09-28 MED ORDER — AMLODIPINE BESYLATE 5 MG PO TABS
2.5000 mg | ORAL_TABLET | Freq: Every day | ORAL | Status: DC
  Administered 2016-10-01 – 2016-10-05 (×5): 2.5 mg via ORAL
  Filled 2016-09-28 (×6): qty 1

## 2016-09-28 MED ORDER — ACETAMINOPHEN 325 MG PO TABS
650.0000 mg | ORAL_TABLET | Freq: Four times a day (QID) | ORAL | Status: DC | PRN
  Administered 2016-10-02: 650 mg via ORAL
  Filled 2016-09-28: qty 2

## 2016-09-28 MED ORDER — METHYLPREDNISOLONE SODIUM SUCC 125 MG IJ SOLR
125.0000 mg | Freq: Three times a day (TID) | INTRAMUSCULAR | Status: DC
  Administered 2016-09-28 – 2016-10-03 (×16): 125 mg via INTRAVENOUS
  Filled 2016-09-28 (×16): qty 2

## 2016-09-28 MED ORDER — FLUOXETINE HCL 20 MG PO CAPS
20.0000 mg | ORAL_CAPSULE | Freq: Every day | ORAL | Status: DC
  Administered 2016-10-01 – 2016-10-09 (×9): 20 mg via ORAL
  Filled 2016-09-28 (×10): qty 1

## 2016-09-28 MED ORDER — DIPHENHYDRAMINE HCL 50 MG PO CAPS
50.0000 mg | ORAL_CAPSULE | Freq: Every day | ORAL | Status: DC
  Administered 2016-09-28: 50 mg via ORAL
  Filled 2016-09-28: qty 1

## 2016-09-28 MED ORDER — IPRATROPIUM-ALBUTEROL 0.5-2.5 (3) MG/3ML IN SOLN
3.0000 mL | RESPIRATORY_TRACT | Status: DC
  Administered 2016-09-28 – 2016-10-07 (×56): 3 mL via RESPIRATORY_TRACT
  Filled 2016-09-28 (×57): qty 3

## 2016-09-28 MED ORDER — DOXYCYCLINE HYCLATE 100 MG IV SOLR
100.0000 mg | Freq: Two times a day (BID) | INTRAVENOUS | Status: DC
  Administered 2016-09-28 – 2016-10-02 (×9): 100 mg via INTRAVENOUS
  Filled 2016-09-28 (×11): qty 100

## 2016-09-28 NOTE — ED Notes (Addendum)
Hospitalist at bedside. Verbal to apply BIPAP. RT aware.   Abigail Butts on floor made aware of pt plan of care and pending room change.
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
## 2016-09-28 NOTE — ED Notes (Signed)
Pt called EMS for shortness of breath, she received and albuterol treatment and current;y receiving a duoneb, she also had magnesium, epi and solumedrol upon arrival with EMS

## 2016-09-28 NOTE — ED Notes (Signed)
Pt had episode of decreased SpO2 while using bedpan; Oxygenation was at 83% and did not improve with 6L [place]; pt placed on simple mask at 10L with improvement to the upper 90's; after stabilization of oxygen level; pt reduced to [place] at 4L and maintained SpO2 in the mid 90's

## 2016-09-28 NOTE — ED Notes (Signed)
EKG given to EDP,Rees,MD., for review. 

## 2016-09-28 NOTE — ED Notes (Signed)
With attempt to transfer pt pt dyspneic with exertion. Pt lungs sounds diminished and expiratory wheezing heard throughout lung fields. Last treatment ended 66. Ralene Bathe notified of pt event.Treatment applied. Respiratory en route.

## 2016-09-28 NOTE — Progress Notes (Addendum)
This is a no charge note   Pending admission per Dr. Ralene Bathe  55 year old lady with past medical history of tobacco abuse, COPD, on 2 L oxygen at home, asthma, hypothyroidism, depression, anxiety, PTSD, schizophrenia, bipolar disorder, breast cancer, dCHF, who presents with cough, shortness and wheezing. Chest x-ray negative. Clinically consistent with COPD exacerbation. Pt was given 15 mg of Albuterol, 0.5 mg of Atrovent , 0.3 Epinephrene 1:1, Solumedrol  125 mg and Mag Sulfate by EMS,  Improved clinically per EDP. Pt is accepted to tele bed as inpt.  Ivor Costa, MD  [facility] Pager [phone]  If 7PM-7AM, please contact night-coverage [email or web address] Password [facility] 09/28/2016, 4:43 AM

## 2016-09-28 NOTE — ED Notes (Signed)
Respiratory at bedside.
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
## 2016-09-28 NOTE — ED Notes (Signed)
5th floor refused to take report at this time

## 2016-09-28 NOTE — ED Triage Notes (Signed)
Pt brought in by EMS with c/o SOB, wheezing for 30 minutes prior to call EMS. PT was Give 15 mg of Albuterol, 0.5mg  of Atrovent , 0.3 Epinephrene 1:1, Solumedrol  125 mg . 2 mg,  Mag Sulfate. Pt did use her albuterol inhaler prior to arrival. Pt has maintain 97% O2 sat. Per EMS pt has had a cough the past few day, and has been congested. Pt does report smoking prior to SOB exacerbation.

## 2016-09-28 NOTE — ED Notes (Signed)
Pt requesting sandwich before breathing treatment; this nurse told pt her breathing treatment is the priority at this time; pt verbalized understanding

## 2016-09-28 NOTE — ED Notes (Signed)
PT 02 drop down to 83% on 2L. 86% on 3L RN have been info. 4L 90%

## 2016-09-28 NOTE — ED Notes (Signed)
RT en route to assist in transfer to floor.

## 2016-09-28 NOTE — ED Notes (Addendum)
Courtney Terry on floor updated plan to draw arterial blood gas prior to transfer. Bed change may be required related to results of test.   RT en route to draw art blood.

## 2016-09-28 NOTE — ED Notes (Signed)
MD at bedside. 

## 2016-09-28 NOTE — ED Notes (Signed)
Visually pt status unchanged since treatment given. Pt labored at rest with expiratory wheeze and diminished. However pt oxygen saturation improved. Courtney Terry notified of all.

## 2016-09-28 NOTE — Consult Note (Signed)
Name: Courtney Terry MRN: [id number] DOB: 1961/09/01    ADMISSION DATE:  09/28/2016 CONSULTATION DATE:  09/28/2016  REFERRING MD :  Nile Riggs [name]  CHIEF COMPLAINT:  Acute on chronic respiratory failure  BRIEF PATIENT DESCRIPTION: 55 year old female with history of O2 dep COPD and non-compliance with medical therapy who is still actively smoking presenting to the hospital with worsening SOB for a week.  Reports wheezing, cough and increased sputum production with subjective fever and chills.  Patient is on BiPAP for worsening hypercarbia and unable to provide further history.  SIGNIFICANT EVENTS  12/23 admission to hospital for COPD exacerbation.  STUDIES:  CXR 12/23 without acute disease.  HISTORY OF PRESENT ILLNESS:  54 year old female with history of O2 dep COPD and non-compliance with medical therapy who is still actively smoking presenting to the hospital with worsening SOB for a week.  Reports wheezing, cough and increased sputum production with subjective fever and chills.  Patient is on BiPAP for worsening hypercarbia and unable to provide further history.  PAST MEDICAL HISTORY :   has a past medical history of Anemia; Anxiety; Arthritis; Asthma; Bipolar 1 disorder ([place]); Breast cancer ([place]) (01/2012); Cancer of right breast [facility]) (03/2015); COPD (chronic obstructive pulmonary disease) ([place]); Depression; Epilepsy ([place]); Hallucination; Hypothyroidism; PTSD (post-traumatic stress disorder); Schizophrenia ([place]); Seizures ([place]); and Shortness of breath dyspnea.  has a past surgical history that includes Patella fracture surgery (Right, 1993); Tonsillectomy; Ovarian cyst surgery; Breast lumpectomy with needle localization and axillary sentinel lymph node bx (03/06/2012); Hernia repair (Left); Mastectomy complete / simple (Right, 03/14/2015); Breast reconstruction with placement of tissue expander and flex hd (acellular hydrated dermis) (Right); Nipple sparing mastectomy (Right, 03/14/2015);  Breast reconstruction with placement of tissue expander and flex hd (acellular hydrated dermis) (Right, 03/14/2015); Removal of bilateral tissue expanders with placement of bilateral breast implants (06/06/2015); Removal of tissue expander and placement of implant (Right, 06/06/2015); Breast implant removal (Right, 06/19/2015); Breast implant removal (Left, 06/20/2015); Placement of breast implants (Bilateral, 10/20/2015); Breast biopsy (Right, 02/2012); Breast lumpectomy (Right, 02/2012); Breast biopsy (Right, 02/2015); Placement of breast implants (Bilateral, 10/20/2015); Placement of breast implants (11/07/2015); Breast implant removal (Right, 11/07/2015); Latissimus flap to breast (Right, 03/08/2016); and Tissue expander placement (Right, 03/08/2016). Prior to Admission medications   Medication Sig Start Date End Date Taking? Authorizing Provider  albuterol (PROAIR HFA) 108 (90 Base) MCG/ACT inhaler Up to 2pffs every 4 hours if can't catch your breath Patient taking differently: Inhale 2 puffs into the lungs every 4 (four) hours as needed for shortness of breath (if you can't catch your breath).  03/29/16  Yes Tanda Rockers, MD  albuterol (PROVENTIL) (2.5 MG/3ML) 0.083% nebulizer solution TAKE 3 MLS BY NEBULIZATION EVERY 4 HOURS AS NEEDED FOR WHEEZING OR SHORTNESS OF BREATH 04/01/16  Yes Tanda Rockers, MD  anastrozole (ARIMIDEX) 1 MG tablet TAKE 1 TABLET BY MOUTH DAILY 06/14/16  Yes Truitt Merle, MD  diphenhydrAMINE (BENADRYL) 50 MG capsule Take 50 mg by mouth at bedtime.   Yes Historical Provider, MD  divalproex (DEPAKOTE) 500 MG DR tablet Take 500 mg by mouth 2 (two) times daily.    Yes Historical Provider, MD  FLUoxetine (PROZAC) 20 MG capsule Take 20 mg by mouth daily.   Yes Historical Provider, MD  Glycopyrrolate-Formoterol (BEVESPI AEROSPHERE) 9-4.8 MCG/ACT AERO Inhale 2 puffs into the lungs 2 (two) times daily. 04/16/16  Yes Tanda Rockers, MD  levothyroxine (SYNTHROID, LEVOTHROID) 75 MCG tablet Take 75 mcg by mouth  daily.   Yes Historical Provider, MD  OXYGEN Inhale 2 L into the lungs continuous.   Yes Historical Provider, MD  paliperidone (INVEGA) 9 MG 24 hr tablet Take 9 mg by mouth at bedtime.   Yes Historical Provider, MD  predniSONE (DELTASONE) 10 MG tablet Take 4 tabs for 3 days, then 3 tabs for 3 days, then 2 tabs for 3 days, then 1 tab for 3 days, then 1/2 tab for 4 days. Patient not taking: Reported on 09/28/2016 08/15/16   Shon Millet, DO   Allergies  Allergen Reactions  . Levaquin [Levofloxacin] Hives    FAMILY HISTORY:  family history includes Breast cancer in her sister; Cancer in her brother, other, sister, and sister; Heart disease in her mother. SOCIAL HISTORY:  reports that she has been smoking Cigarettes.  She has a 37.00 pack-year smoking history. She has never used smokeless tobacco. She reports that she uses drugs, including "Crack" cocaine and Cocaine. She reports that she does not drink alcohol.  REVIEW OF SYSTEMS:   Unable to attain patient is on BiPAP.  SUBJECTIVE: SOB and increased sputum production.  VITAL SIGNS: Temp:  [98.6 F (37 C)] 98.6 F (37 C) (12/23 0727) Pulse Rate:  [105-114] 112 (12/23 1008) Resp:  [16-30] 16 (12/23 1008) BP: (105-180)/(64-111) 148/89 (12/23 1008) SpO2:  [90 %-100 %] 100 % (12/23 1008) Weight:  [59 kg (130 lb)] 59 kg (130 lb) (12/23 0241)  PHYSICAL EXAMINATION: General:  Chronically ill appearing AAF in NAD on BiPAP. Neuro:  Alert and interactive, moving all ext to command HEENT:  [place]/AT, PERRL, EOM-I and MMM Cardiovascular:  RRR, Nl S1/S2, -M/R/G. Lungs:  Distant and quite BS with no wheezing. Abdomen:  Soft, NT, ND and +BS. Musculoskeletal:  -edema and -tenderness Skin:  Intact.  Recent Labs Lab 09/28/16 0327  NA 137  K 3.9  CL 101  CO2 28  BUN 12  CREATININE 0.59  GLUCOSE 119*   Recent Labs Lab 09/28/16 0327  HGB 12.9  HCT 40.2  WBC 9.8  PLT 250   Dg Chest Port 1 View  Result Date:  09/28/2016 CLINICAL DATA:  55 year old female with shortness of breath. EXAM: PORTABLE CHEST 1 VIEW COMPARISON:  Chest radiograph dated 08/12/2016 FINDINGS: The lungs are clear. There is no pleural effusion or pneumothorax. The cardiac silhouette is within normal limits. Right axillary and right breast surgical clips and implant. No acute osseous pathology. IMPRESSION: No active disease. Electronically Signed   By: Anner Crete M.D.   On: 09/28/2016 04:08   I reviewed CXR myself, no acute disease.  ASSESSMENT / PLAN:  55 year old female with O2 dependent COPD, non-compliant with therapy and actively smoking presenting with COPD exacerbation resulting in acute on chronic respiratory failure requiring mechanical support.  VCD has also been an issue in the past but does not seem to be now.  Acute on chronic respiratory failure:  - Repeat ABG only if mental status deteriorates on BiPAP.  - F/U imaging.  - Continue BiPAP for mechanical support.  COPD exacerbation:  - Doxy.  - Solumedrol.  - PRN Albuterol.  - Duonebs.  - F/U on cultures.  Hypoxemia:  - Titrate O2 for sat of 88-92%.  Tobacco abuse:  - Smoking cessation.  Discussed with TRH-MD.  Rush Farmer, M.D. [facility] Pulmonary/Critical Care Medicine. Pager: [phone]. After hours pager: [phone].  09/28/2016, 11:04 AM

## 2016-09-28 NOTE — ED Notes (Signed)
Pt requested 2 cups of ice cream; this nurse told pt the ER does not have any ice cream; pt currently eating sandwich and crackers

## 2016-09-28 NOTE — ED Notes (Signed)
Paged and spoke on the phone with [facility] regarding room assignment. Courtney Terry verbalizes pt will be seen by critical care.

## 2016-09-28 NOTE — ED Notes (Signed)
Abigail Butts on floor notified of plan of care to give treatment prior to transfer.

## 2016-09-28 NOTE — ED Notes (Signed)
Pt resting in NAD at present time. Family at bedside.

## 2016-09-29 ENCOUNTER — Inpatient Hospital Stay (HOSPITAL_COMMUNITY): Payer: Medicaid Other

## 2016-09-29 DIAGNOSIS — E875 Hyperkalemia: Secondary | ICD-10-CM | POA: Diagnosis not present

## 2016-09-29 DIAGNOSIS — G9340 Encephalopathy, unspecified: Secondary | ICD-10-CM

## 2016-09-29 DIAGNOSIS — F319 Bipolar disorder, unspecified: Secondary | ICD-10-CM | POA: Diagnosis present

## 2016-09-29 DIAGNOSIS — E039 Hypothyroidism, unspecified: Secondary | ICD-10-CM | POA: Diagnosis present

## 2016-09-29 DIAGNOSIS — C50411 Malignant neoplasm of upper-outer quadrant of right female breast: Secondary | ICD-10-CM

## 2016-09-29 DIAGNOSIS — E8729 Other acidosis: Secondary | ICD-10-CM

## 2016-09-29 DIAGNOSIS — E872 Acidosis: Secondary | ICD-10-CM

## 2016-09-29 LAB — CBC
HCT: 40.8 % (ref 36.0–46.0)
HEMOGLOBIN: 12.7 g/dL (ref 12.0–15.0)
MCH: 26.7 pg (ref 26.0–34.0)
MCHC: 31.1 g/dL (ref 30.0–36.0)
MCV: 85.7 fL (ref 78.0–100.0)
Platelets: 249 10*3/uL (ref 150–400)
RBC: 4.76 MIL/uL (ref 3.87–5.11)
RDW: 16.9 % — ABNORMAL HIGH (ref 11.5–15.5)
WBC: 5.6 10*3/uL (ref 4.0–10.5)

## 2016-09-29 LAB — COMPREHENSIVE METABOLIC PANEL
ALK PHOS: 91 U/L (ref 38–126)
ALT: 15 U/L (ref 14–54)
ANION GAP: 7 (ref 5–15)
AST: 20 U/L (ref 15–41)
Albumin: 3.8 g/dL (ref 3.5–5.0)
BUN: 19 mg/dL (ref 6–20)
CALCIUM: 9 mg/dL (ref 8.9–10.3)
CO2: 29 mmol/L (ref 22–32)
Chloride: 101 mmol/L (ref 101–111)
Creatinine, Ser: 0.57 mg/dL (ref 0.44–1.00)
GFR calc non Af Amer: >60 mL/min (ref >60)
Glucose, Bld: 103 mg/dL — ABNORMAL HIGH (ref 65–99)
POTASSIUM: 5.6 mmol/L — AB (ref 3.5–5.1)
SODIUM: 137 mmol/L (ref 135–145)
Total Bilirubin: 0.5 mg/dL (ref 0.3–1.2)
Total Protein: 6.7 g/dL (ref 6.5–8.1)

## 2016-09-29 LAB — BLOOD GAS, ARTERIAL
Acid-Base Excess: 4.3 mmol/L — ABNORMAL HIGH (ref 0.0–2.0)
Bicarbonate: 31.9 mmol/L — ABNORMAL HIGH (ref 20.0–28.0)
DRAWN BY: 441261
Delivery systems: POSITIVE
Expiratory PAP: 8
FIO2: 60
Inspiratory PAP: 17
O2 SAT: 98.1 %
PATIENT TEMPERATURE: 37
PCO2 ART: 65.7 mmHg — AB (ref 32.0–48.0)
PO2 ART: 118 mmHg — AB (ref 83.0–108.0)
RATE: 12 resp/min
pH, Arterial: 7.307 — ABNORMAL LOW (ref 7.350–7.450)

## 2016-09-29 LAB — RESPIRATORY PANEL BY PCR
Adenovirus: NOT DETECTED
Bordetella pertussis: NOT DETECTED
CORONAVIRUS 229E-RVPPCR: NOT DETECTED
CORONAVIRUS HKU1-RVPPCR: NOT DETECTED
CORONAVIRUS OC43-RVPPCR: NOT DETECTED
Chlamydophila pneumoniae: NOT DETECTED
Coronavirus NL63: NOT DETECTED
Influenza A: NOT DETECTED
Influenza B: NOT DETECTED
METAPNEUMOVIRUS-RVPPCR: NOT DETECTED
Mycoplasma pneumoniae: NOT DETECTED
PARAINFLUENZA VIRUS 1-RVPPCR: NOT DETECTED
PARAINFLUENZA VIRUS 2-RVPPCR: NOT DETECTED
Parainfluenza Virus 3: NOT DETECTED
Parainfluenza Virus 4: NOT DETECTED
RESPIRATORY SYNCYTIAL VIRUS-RVPPCR: NOT DETECTED
Rhinovirus / Enterovirus: DETECTED — AB

## 2016-09-29 LAB — INFLUENZA PANEL BY PCR (TYPE A & B)
INFLAPCR: NEGATIVE
INFLBPCR: NEGATIVE

## 2016-09-29 LAB — POTASSIUM: Potassium: 5.4 mmol/L — ABNORMAL HIGH (ref 3.5–5.1)

## 2016-09-29 MED ORDER — PANTOPRAZOLE SODIUM 40 MG PO TBEC: 40.0000 mg | DELAYED_RELEASE_TABLET | Freq: Every day | ORAL | Status: DC

## 2016-09-29 MED ORDER — FLUTICASONE PROPIONATE 50 MCG/ACT NA SUSP
2.0000 | Freq: Every day | NASAL | Status: DC
  Administered 2016-10-01 – 2016-10-09 (×7): 2 via NASAL
  Filled 2016-09-29: qty 16

## 2016-09-29 MED ORDER — BUDESONIDE 0.25 MG/2ML IN SUSP
0.2500 mg | Freq: Two times a day (BID) | RESPIRATORY_TRACT | Status: DC
  Administered 2016-09-29 – 2016-10-09 (×21): 0.25 mg via RESPIRATORY_TRACT
  Filled 2016-09-29 (×21): qty 2

## 2016-09-29 MED ORDER — LORATADINE 10 MG PO TABS
10.0000 mg | ORAL_TABLET | Freq: Every day | ORAL | Status: DC
  Administered 2016-10-01 – 2016-10-09 (×9): 10 mg via ORAL
  Filled 2016-09-29 (×9): qty 1

## 2016-09-29 NOTE — Progress Notes (Signed)
Name: Courtney Terry MRN: [id number] DOB: June 27, 1961    ADMISSION DATE:  09/28/2016 CONSULTATION DATE:  09/28/2016  REFERRING MD :  Nile Riggs [name]  CHIEF COMPLAINT:  Acute on chronic respiratory failure  BRIEF PATIENT DESCRIPTION: 55 year old female with history of O2 dep COPD and non-compliance with medical therapy who is still actively smoking presenting to the hospital with worsening SOB for a week.  Reports wheezing, cough and increased sputum production with subjective fever and chills.  Patient is on BiPAP for worsening hypercarbia and unable to provide further history.  SIGNIFICANT EVENTS  12/23 admission to hospital for COPD exacerbation.  STUDIES:  CXR 12/23 without acute disease.  HISTORY OF PRESENT ILLNESS:  55 year old female with history of O2 dep COPD and non-compliance with medical therapy who is still actively smoking presenting to the hospital with worsening SOB for a week.  Reports wheezing, cough and increased sputum production with subjective fever and chills.  Patient is on BiPAP for worsening hypercarbia and unable to provide further history.  SUBJECTIVE: Continues to be on BiPAP, no events overnight, no new complaints.  VITAL SIGNS: Temp:  [97.4 F (36.3 C)-98.5 F (36.9 C)] 98 F (36.7 C) (12/24 0800) Pulse Rate:  [82-98] 90 (12/24 0754) Resp:  [12-22] 12 (12/24 0800) BP: (83-181)/(46-139) 130/91 (12/24 0800) SpO2:  [88 %-100 %] 100 % (12/24 0800) FiO2 (%):  [60 %] 60 % (12/24 0754)  PHYSICAL EXAMINATION: General:  Chronically ill appearing AAF in lethargic on BiPAP. Neuro:  Alert and interactive, moving all ext to command HEENT:  [place]/AT, PERRL, EOM-I and MMM Cardiovascular:  RRR, Nl S1/S2, -M/R/G. Lungs:  Distant and quite BS with no wheezing. Abdomen:  Soft, NT, ND and +BS. Musculoskeletal:  -edema and -tenderness Skin:  Intact.  Recent Labs Lab 09/28/16 0327 09/29/16 0325 09/29/16 0831  NA 137 137  --   K 3.9 5.6* 5.4*  CL 101 101  --     CO2 28 29  --   BUN 12 19  --   CREATININE 0.59 0.57  --   GLUCOSE 119* 103*  --     Recent Labs Lab 09/28/16 0327 09/29/16 0325  HGB 12.9 12.7  HCT 40.2 40.8  WBC 9.8 5.6  PLT 250 249   Dg Chest Port 1 View  Result Date: 09/29/2016 CLINICAL DATA:  Decreased oxygen saturation this morning. EXAM: PORTABLE CHEST 1 VIEW COMPARISON:  Single view chest 09/28/2016 and 07/01/2014. FINDINGS: The lungs are severely emphysematous but clear. Heart size is normal. No pneumothorax or pleural effusion. Soft tissue expander in the right breast is noted. Surgical clips the right axilla are seen. IMPRESSION: Severe emphysema without acute disease. Electronically Signed   By: Inge Rise M.D.   On: 09/29/2016 09:59   Dg Chest Port 1 View  Result Date: 09/28/2016 CLINICAL DATA:  55 year old female with shortness of breath. EXAM: PORTABLE CHEST 1 VIEW COMPARISON:  Chest radiograph dated 08/12/2016 FINDINGS: The lungs are clear. There is no pleural effusion or pneumothorax. The cardiac silhouette is within normal limits. Right axillary and right breast surgical clips and implant. No acute osseous pathology. IMPRESSION: No active disease. Electronically Signed   By: Anner Crete M.D.   On: 09/28/2016 04:08   I reviewed CXR myself, no acute disease.  ASSESSMENT / PLAN:  55 year old female with O2 dependent COPD, non-compliant with therapy and actively smoking presenting with COPD exacerbation resulting in acute on chronic respiratory failure requiring mechanical support.  VCD has also been an issue in the past but does not seem to be now.  Acute on chronic respiratory failure:  - Repeat ABG with pH of 7.31  - F/U imaging.  - Attempt to get off BiPAP today.  Hyperkalemia:  - IVF  - No kayexalate for now  - BMET in AM  COPD exacerbation:  - Doxy.  - Solumedrol.  - PRN Albuterol.  - Duonebs.  - F/U on cultures.  Hypoxemia:  - Titrate O2 for sat of 88-92%.  Tobacco abuse:  -  Smoking cessation.  Discussed with RT and PCCM-NP.  Rush Farmer, M.D. [facility] Pulmonary/Critical Care Medicine. Pager: [phone]. After hours pager: [phone].  09/29/2016, 10:47 AM

## 2016-09-29 NOTE — Progress Notes (Signed)
PROGRESS NOTE    Courtney Terry  [id number] DOB: 1961-08-04 DOA: 09/28/2016 PCP: Pcp Not In System    Brief Narrative:  Patient is a 55 year old female history of chronic respiratory failure on 2 L nasal cannula oxygen with ongoing tobacco abuse, bipolar disorder, history of breast cancer, history of polysubstance abuse presenting to the ED with subjective fevers, chills, wheezing, productive cough and worsening respiratory status. Patient admitted for COPD exacerbation and placed on the BiPAP. Pulmonary and critical care consulted.   Assessment & Plan:   Principal Problem:   Acute on chronic respiratory failure with hypoxia (HCC) Active Problems:   COPD exacerbation (HCC)   Breast cancer of upper-outer quadrant of right female breast (HCC)   COPD GOLD IV with reversibility/ still smoking   Cigarette smoker   Protein-calorie malnutrition, severe   Schizophrenia ([place])   Cocaine abuse   Hyperkalemia   Hypothyroidism   Bipolar I disorder ([place])  #1 acute on chronic respiratory failure with hypoxia and hypercarbia secondary to acute COPD exacerbation Likely secondary to acute COPD exacerbation. Chest x-ray on admission negative for any acute infiltrate. Patient with increasing respiratory distress and a such has been on the BiPAP since admission. Patient was tried off the BiPAP yesterday however with worsening respiratory status was placed back on the BiPAP. Repeat chest x-ray pending. Will check a respiratory viral panel. Check an influenza PCR. Placed on droplet precautions. Continue IV doxycycline, IV Solu-Medrol, scheduled nebulizers. Place on Pulmicort, Claritin, PPI, Flonase. Trial of BiPAP today however will defer to PCCM. [facility] and following and appreciate input and recommendations.  #2 acute COPD exacerbation Patient with chronic respiratory failure on home O2 with ongoing tobacco abuse. Presented to the ED with worsening shortness of breath wheezing, productive cough. Chest  x-ray negative for any acute infiltrate. Repeat chest x-ray pending. Check a RSV  panel. Check an influenza PCR. Droplet precautions. Continue IV doxycycline, IV Solu-Medrol, scheduled nebulizers. Add Pulmicort, PPI, Claritin, Flonase.  #3 tobacco abuse Tobacco cessation. Place nicotine patch.  #4 history of breast cancer Continue Arimidex. Outpatient follow-up.  #5 hypothyroidism Check a TSH. Continue home dose Synthroid.  #6 bipolar disorder Continue Depakote.  #7 hyperkalemia Replete potassium level. If potassium level still elevated will give Kayexalate.  #8 severe protein calorie malnutrition Once patient is off the BiPAP with clinical improvement and tolerating oral intake will consult with dietitian and placed on nutritional supplementation.   DVT prophylaxis: SCDs. Code Status: Full Family Communication: Updated patient. No family at bedside. Disposition Plan: Remain in the step down unit.   Consultants:   PCCM: Dr Nelda Marseille 09/28/2016  Procedures:   Chest x-ray 09/28/2016, 09/29/2016  Antimicrobials:   IV doxycycline 09/28/2016   Subjective: On BiPAP. Per nursing yesterday and overnight when patient was tried off the BiPAP went into significant respiratory distress with tachycardia hypertension and BiPAP had to be placed back on patient. Patient resting comfortably on BiPAP. Opens eyes to verbal stimuli. Patient nods her head that her breathing has improved since admission.  Objective: Vitals:   09/29/16 0600 09/29/16 0700 09/29/16 0754 09/29/16 0800  BP: 103/65 (!) 137/96  (!) 130/91  Pulse:   90   Resp: 13 12 15 12   Temp:    98 F (36.7 C)  TempSrc:    Axillary  SpO2: 98% 100% 100% 100%  Weight:      Height:        Intake/Output Summary (Last 24 hours) at 09/29/16 [id number] Last data filed at 09/28/16 2300  Gross per 24 hour  Intake          1111.25 ml  Output                0 ml  Net          1111.25 ml   Filed Weights   09/28/16 0241  Weight: 59  kg (130 lb)    Examination:  General exam: On Bipap. Respiratory system: Poor to fair air movement. Expiratory wheezing. No crackles. Of Cardiovascular system: S1 & S2 heard, RRR. No JVD, murmurs, rubs, gallops or clicks. No pedal edema. Gastrointestinal system: Abdomen is nondistended, soft and nontender. No organomegaly or masses felt. Normal bowel sounds heard. Central nervous system: Alert and oriented. No focal neurological deficits. Extremities: Symmetric 5 x 5 power. Skin: No rashes, lesions or ulcers Psychiatry: Unable to assess.    Data Reviewed: I have personally reviewed following labs and imaging studies  CBC:  Recent Labs Lab 09/28/16 0327 09/29/16 0325  WBC 9.8 5.6  NEUTROABS 9.1*  --   HGB 12.9 12.7  HCT 40.2 40.8  MCV 84.3 85.7  PLT 250 0000000   Basic Metabolic Panel:  Recent Labs Lab 09/28/16 0327 09/29/16 0325  NA 137 137  K 3.9 5.6*  CL 101 101  CO2 28 29  GLUCOSE 119* 103*  BUN 12 19  CREATININE 0.59 0.57  CALCIUM 8.6* 9.0   GFR: Estimated Creatinine Clearance: 74.9 mL/min (by C-G formula based on SCr of 0.57 mg/dL). Liver Function Tests:  Recent Labs Lab 09/29/16 0325  AST 20  ALT 15  ALKPHOS 91  BILITOT 0.5  PROT 6.7  ALBUMIN 3.8   No results for input(s): LIPASE, AMYLASE in the last 168 hours. No results for input(s): AMMONIA in the last 168 hours. Coagulation Profile: No results for input(s): INR, PROTIME in the last 168 hours. Cardiac Enzymes: No results for input(s): CKTOTAL, CKMB, CKMBINDEX, TROPONINI in the last 168 hours. BNP (last 3 results) No results for input(s): PROBNP in the last 8760 hours. HbA1C: No results for input(s): HGBA1C in the last 72 hours. CBG:  Recent Labs Lab 09/28/16 1815  GLUCAP 129*   Lipid Profile: No results for input(s): CHOL, HDL, LDLCALC, TRIG, CHOLHDL, LDLDIRECT in the last 72 hours. Thyroid Function Tests: No results for input(s): TSH, T4TOTAL, FREET4, T3FREE, THYROIDAB in the last  72 hours. Anemia Panel: No results for input(s): VITAMINB12, FOLATE, FERRITIN, TIBC, IRON, RETICCTPCT in the last 72 hours. Sepsis Labs: No results for input(s): PROCALCITON, LATICACIDVEN in the last 168 hours.  Recent Results (from the past 240 hour(s))  MRSA PCR Screening     Status: None   Collection Time: 09/28/16  1:42 PM  Result Value Ref Range Status   MRSA by PCR NEGATIVE NEGATIVE Final    Comment:        The GeneXpert MRSA Assay (FDA approved for NASAL specimens only), is one component of a comprehensive MRSA colonization surveillance program. It is not intended to diagnose MRSA infection nor to guide or monitor treatment for MRSA infections.          Radiology Studies: Dg Chest Port 1 View  Result Date: 09/28/2016 CLINICAL DATA:  55 year old female with shortness of breath. EXAM: PORTABLE CHEST 1 VIEW COMPARISON:  Chest radiograph dated 08/12/2016 FINDINGS: The lungs are clear. There is no pleural effusion or pneumothorax. The cardiac silhouette is within normal limits. Right axillary and right breast surgical clips and implant. No acute osseous pathology. IMPRESSION: No active  disease. Electronically Signed   By: Anner Crete M.D.   On: 09/28/2016 04:08        Scheduled Meds: . amLODipine  2.5 mg Oral Daily  . anastrozole  1 mg Oral Daily  . diphenhydrAMINE  50 mg Oral QHS  . divalproex  500 mg Oral BID  . doxycycline (VIBRAMYCIN) IV  100 mg Intravenous Q12H  . FLUoxetine  20 mg Oral Daily  . heparin  5,000 Units Subcutaneous Q8H  . ipratropium-albuterol  3 mL Nebulization Q4H  . levothyroxine  75 mcg Oral Daily  . methylPREDNISolone sodium succinate  125 mg Intravenous Q8H  . paliperidone  9 mg Oral QHS   Continuous Infusions: . sodium chloride 75 mL/hr at 09/29/16 0554  . albuterol Stopped (09/28/16 0459)     LOS: 1 day    Time spent: 77 mins    THOMPSON,DANIEL, MD [facility] Pager [phone]  If 7PM-7AM, please contact  night-coverage [email or web address] Password [facility] 09/29/2016, 9:37 AM

## 2016-09-30 DIAGNOSIS — B348 Other viral infections of unspecified site: Secondary | ICD-10-CM | POA: Diagnosis present

## 2016-09-30 LAB — CBC
HEMATOCRIT: 39 % (ref 36.0–46.0)
HEMOGLOBIN: 12.1 g/dL (ref 12.0–15.0)
MCH: 26.7 pg (ref 26.0–34.0)
MCHC: 31 g/dL (ref 30.0–36.0)
MCV: 86.1 fL (ref 78.0–100.0)
Platelets: 243 10*3/uL (ref 150–400)
RBC: 4.53 MIL/uL (ref 3.87–5.11)
RDW: 16.9 % — ABNORMAL HIGH (ref 11.5–15.5)
WBC: 7.5 10*3/uL (ref 4.0–10.5)

## 2016-09-30 LAB — BASIC METABOLIC PANEL
ANION GAP: 7 (ref 5–15)
BUN: 20 mg/dL (ref 6–20)
CHLORIDE: 99 mmol/L — AB (ref 101–111)
CO2: 31 mmol/L (ref 22–32)
Calcium: 8.8 mg/dL — ABNORMAL LOW (ref 8.9–10.3)
Creatinine, Ser: 0.61 mg/dL (ref 0.44–1.00)
GFR calc non Af Amer: >60 mL/min (ref >60)
Glucose, Bld: 101 mg/dL — ABNORMAL HIGH (ref 65–99)
Potassium: 5 mmol/L (ref 3.5–5.1)
Sodium: 137 mmol/L (ref 135–145)

## 2016-09-30 LAB — TSH: TSH: 0.583 u[IU]/mL (ref 0.350–4.500)

## 2016-09-30 LAB — MAGNESIUM: Magnesium: 1.9 mg/dL (ref 1.7–2.4)

## 2016-09-30 MED ORDER — PANTOPRAZOLE SODIUM 40 MG IV SOLR
40.0000 mg | INTRAVENOUS | Status: DC
  Administered 2016-09-30 – 2016-10-02 (×3): 40 mg via INTRAVENOUS
  Filled 2016-09-30 (×3): qty 40

## 2016-09-30 MED ORDER — NICOTINE 14 MG/24HR TD PT24
14.0000 mg | MEDICATED_PATCH | Freq: Every day | TRANSDERMAL | Status: DC
  Administered 2016-09-30 – 2016-10-09 (×10): 14 mg via TRANSDERMAL
  Filled 2016-09-30 (×11): qty 1

## 2016-09-30 MED ORDER — HYDRALAZINE HCL 20 MG/ML IJ SOLN: 5.0000 mg | Freq: Four times a day (QID) | INTRAMUSCULAR | Status: DC | PRN

## 2016-09-30 MED ORDER — VALPROATE SODIUM 500 MG/5ML IV SOLN
500.0000 mg | Freq: Two times a day (BID) | INTRAVENOUS | Status: DC
  Administered 2016-09-30 – 2016-10-02 (×5): 500 mg via INTRAVENOUS
  Filled 2016-09-30 (×6): qty 5

## 2016-09-30 MED ORDER — DIPHENHYDRAMINE HCL 50 MG/ML IJ SOLN
25.0000 mg | Freq: Every evening | INTRAMUSCULAR | Status: DC | PRN
  Administered 2016-09-30 – 2016-10-01 (×2): 25 mg via INTRAVENOUS
  Filled 2016-09-30 (×2): qty 1

## 2016-09-30 MED ORDER — LEVOTHYROXINE SODIUM 100 MCG IV SOLR
37.5000 ug | Freq: Every day | INTRAVENOUS | Status: DC
  Administered 2016-10-01: 37.5 ug via INTRAVENOUS
  Administered 2016-10-02: 100 ug via INTRAVENOUS
  Filled 2016-09-30 (×2): qty 5

## 2016-09-30 MED ORDER — FUROSEMIDE 10 MG/ML IJ SOLN
20.0000 mg | Freq: Four times a day (QID) | INTRAMUSCULAR | Status: AC
  Administered 2016-09-30 – 2016-10-01 (×3): 20 mg via INTRAVENOUS
  Filled 2016-09-30 (×3): qty 2

## 2016-09-30 MED ORDER — SODIUM CHLORIDE 0.9 % IV SOLN
INTRAVENOUS | Status: DC
  Administered 2016-09-30: 20:00:00 via INTRAVENOUS

## 2016-09-30 NOTE — Progress Notes (Signed)
PROGRESS NOTE    Courtney Terry  [id number] DOB: 06-26-1961 DOA: 09/28/2016 PCP: Pcp Not In System    Brief Narrative:  Patient is a 55 year old female history of chronic respiratory failure on 2 L nasal cannula oxygen with ongoing tobacco abuse, bipolar disorder, history of breast cancer, history of polysubstance abuse presenting to the ED with subjective fevers, chills, wheezing, productive cough and worsening respiratory status. Patient admitted for COPD exacerbation and placed on the BiPAP. Pulmonary and critical care consulted.   Assessment & Plan:   Principal Problem:   Acute on chronic respiratory failure with hypoxia (HCC) Active Problems:   COPD exacerbation (HCC)   Rhinovirus infection   Breast cancer of upper-outer quadrant of right female breast ([place])   COPD GOLD IV with reversibility/ still smoking   Cigarette smoker   Protein-calorie malnutrition, severe   Schizophrenia (HCC)   Cocaine abuse   Hyperkalemia   Hypothyroidism   Bipolar I disorder (HCC)   Respiratory acidosis   Acute encephalopathy  #1 acute on chronic respiratory failure with hypoxia and hypercarbia secondary to acute COPD exacerbation Likely secondary to acute COPD exacerbation likely triggered by rhinovirus. RSV panel positive for rhinovirus.. Chest x-ray on admission negative for any acute infiltrate. Patient with increasing respiratory distress and as such has been on the BiPAP since admission. Patient was tried off the BiPAP yesterday however with worsening respiratory status was placed back on the BiPAP.  Influenza A PCR negative. Continue on droplet precautions. Continue IV doxycycline, IV Solu-Medrol taper, scheduled nebulizers, Pulmicort, Claritin, PPI, Flonase. Trial of BiPAP today however will defer to PCCM. PCCM following and appreciate input and recommendations.  #2 acute COPD exacerbation Patient with chronic respiratory failure on home O2 with ongoing tobacco abuse. Presented to the  ED with worsening shortness of breath wheezing, productive cough. Chest x-ray negative for any acute infiltrate. Repeat chest x-ray from 09/29/2016 with severe COPD. RSV panel positive for rhinovirus. Influenza A PCR negative. Droplet precautions. Continue IV doxycycline, IV Solu-Medrol taper, scheduled nebulizers, Pulmicort, PPI, Claritin, Flonase.PCCM ff.  #3 tobacco abuse Tobacco cessation. Place on nicotine patch.  #4 history of breast cancer Continue Arimidex. Outpatient follow-up.  #5 hypothyroidism TSH 0.583. Continue home dose Synthroid.  #6 bipolar disorder Continue Depakote.  #7 hyperkalemia Improved.   #8 severe protein calorie malnutrition Once patient is off the BiPAP with clinical improvement and tolerating oral intake will consult with dietitian and place on nutritional supplementation.   DVT prophylaxis: SCDs. Code Status: Full Family Communication: Updated patient. No family at bedside. Disposition Plan: Remain in the step down unit.   Consultants:   PCCM: Dr Nelda Marseille 09/28/2016  Procedures:   Chest x-ray 09/28/2016, 09/29/2016  Antimicrobials:   IV doxycycline 09/28/2016   Subjective: On BiPAP. Patient states breathing has improved. Patient denies any chest pain. Patient asking for food to eat.   Objective: Vitals:   09/30/16 0809 09/30/16 0820 09/30/16 0825 09/30/16 0900  BP:    122/77  Pulse:      Resp:    14  Temp:      TempSrc:      SpO2: 98% (!) 75% 95% 96%  Weight:      Height:        Intake/Output Summary (Last 24 hours) at 09/30/16 0938 Last data filed at 09/30/16 0800  Gross per 24 hour  Intake             1475 ml  Output  0 ml  Net             1475 ml   Filed Weights   09/28/16 0241  Weight: 59 kg (130 lb)    Examination:  General exam: On Bipap. Respiratory system: Poor to fair air movement. Decreased Expiratory wheezing. No crackles. Cardiovascular system: S1 & S2 heard, RRR. No JVD, murmurs, rubs,  gallops or clicks. No pedal edema. Gastrointestinal system: Abdomen is nondistended, soft and nontender. No organomegaly or masses felt. Normal bowel sounds heard. Central nervous system: Alert and oriented. No focal neurological deficits. Extremities: Symmetric 5 x 5 power. Skin: No rashes, lesions or ulcers Psychiatry: Unable to assess.    Data Reviewed: I have personally reviewed following labs and imaging studies  CBC:  Recent Labs Lab 09/28/16 0327 09/29/16 0325 09/30/16 0322  WBC 9.8 5.6 7.5  NEUTROABS 9.1*  --   --   HGB 12.9 12.7 12.1  HCT 40.2 40.8 39.0  MCV 84.3 85.7 86.1  PLT 250 249 0000000   Basic Metabolic Panel:  Recent Labs Lab 09/28/16 0327 09/29/16 0325 09/29/16 0831 09/30/16 0322  NA 137 137  --  137  K 3.9 5.6* 5.4* 5.0  CL 101 101  --  99*  CO2 28 29  --  31  GLUCOSE 119* 103*  --  101*  BUN 12 19  --  20  CREATININE 0.59 0.57  --  0.61  CALCIUM 8.6* 9.0  --  8.8*  MG  --   --   --  1.9   GFR: Estimated Creatinine Clearance: 74.9 mL/min (by C-G formula based on SCr of 0.61 mg/dL). Liver Function Tests:  Recent Labs Lab 09/29/16 0325  AST 20  ALT 15  ALKPHOS 91  BILITOT 0.5  PROT 6.7  ALBUMIN 3.8   No results for input(s): LIPASE, AMYLASE in the last 168 hours. No results for input(s): AMMONIA in the last 168 hours. Coagulation Profile: No results for input(s): INR, PROTIME in the last 168 hours. Cardiac Enzymes: No results for input(s): CKTOTAL, CKMB, CKMBINDEX, TROPONINI in the last 168 hours. BNP (last 3 results) No results for input(s): PROBNP in the last 8760 hours. HbA1C: No results for input(s): HGBA1C in the last 72 hours. CBG:  Recent Labs Lab 09/28/16 1815  GLUCAP 129*   Lipid Profile: No results for input(s): CHOL, HDL, LDLCALC, TRIG, CHOLHDL, LDLDIRECT in the last 72 hours. Thyroid Function Tests:  Recent Labs  09/30/16 0322  TSH 0.583   Anemia Panel: No results for input(s): VITAMINB12, FOLATE, FERRITIN,  TIBC, IRON, RETICCTPCT in the last 72 hours. Sepsis Labs: No results for input(s): PROCALCITON, LATICACIDVEN in the last 168 hours.  Recent Results (from the past 240 hour(s))  MRSA PCR Screening     Status: None   Collection Time: 09/28/16  1:42 PM  Result Value Ref Range Status   MRSA by PCR NEGATIVE NEGATIVE Final    Comment:        The GeneXpert MRSA Assay (FDA approved for NASAL specimens only), is one component of a comprehensive MRSA colonization surveillance program. It is not intended to diagnose MRSA infection nor to guide or monitor treatment for MRSA infections.   Respiratory Panel by PCR     Status: Abnormal   Collection Time: 09/29/16 11:07 AM  Result Value Ref Range Status   Adenovirus NOT DETECTED NOT DETECTED Final   Coronavirus 229E NOT DETECTED NOT DETECTED Final   Coronavirus HKU1 NOT DETECTED NOT DETECTED Final  Coronavirus NL63 NOT DETECTED NOT DETECTED Final   Coronavirus OC43 NOT DETECTED NOT DETECTED Final   Metapneumovirus NOT DETECTED NOT DETECTED Final   Rhinovirus / Enterovirus DETECTED (A) NOT DETECTED Final   Influenza A NOT DETECTED NOT DETECTED Final   Influenza B NOT DETECTED NOT DETECTED Final   Parainfluenza Virus 1 NOT DETECTED NOT DETECTED Final   Parainfluenza Virus 2 NOT DETECTED NOT DETECTED Final   Parainfluenza Virus 3 NOT DETECTED NOT DETECTED Final   Parainfluenza Virus 4 NOT DETECTED NOT DETECTED Final   Respiratory Syncytial Virus NOT DETECTED NOT DETECTED Final   Bordetella pertussis NOT DETECTED NOT DETECTED Final   Chlamydophila pneumoniae NOT DETECTED NOT DETECTED Final   Mycoplasma pneumoniae NOT DETECTED NOT DETECTED Final    Comment: Performed at [facility]         Radiology Studies: Dg Chest Port 1 View  Result Date: 09/29/2016 CLINICAL DATA:  Decreased oxygen saturation this morning. EXAM: PORTABLE CHEST 1 VIEW COMPARISON:  Single view chest 09/28/2016 and 07/01/2014. FINDINGS: The lungs are severely  emphysematous but clear. Heart size is normal. No pneumothorax or pleural effusion. Soft tissue expander in the right breast is noted. Surgical clips the right axilla are seen. IMPRESSION: Severe emphysema without acute disease. Electronically Signed   By: Inge Rise M.D.   On: 09/29/2016 09:59        Scheduled Meds: . amLODipine  2.5 mg Oral Daily  . anastrozole  1 mg Oral Daily  . budesonide (PULMICORT) nebulizer solution  0.25 mg Nebulization BID  . diphenhydrAMINE  50 mg Oral QHS  . divalproex  500 mg Oral BID  . doxycycline (VIBRAMYCIN) IV  100 mg Intravenous Q12H  . FLUoxetine  20 mg Oral Daily  . fluticasone  2 spray Each Nare Daily  . heparin  5,000 Units Subcutaneous Q8H  . ipratropium-albuterol  3 mL Nebulization Q4H  . levothyroxine  75 mcg Oral Daily  . loratadine  10 mg Oral Daily  . methylPREDNISolone sodium succinate  125 mg Intravenous Q8H  . paliperidone  9 mg Oral QHS  . pantoprazole  40 mg Oral Daily   Continuous Infusions: . sodium chloride 75 mL/hr at 09/30/16 0600  . albuterol Stopped (09/28/16 0459)     LOS: 2 days    Time spent: 33 mins    Tiffanee Mcnee, MD [facility] Pager [phone]  If 7PM-7AM, please contact night-coverage [email or web address] Password [facility] 09/30/2016, 9:38 AM

## 2016-09-30 NOTE — Progress Notes (Signed)
Name: Courtney Terry MRN: [id number] DOB: March 28, 1961    ADMISSION DATE:  09/28/2016 CONSULTATION DATE:  09/28/2016  REFERRING MD :  Nile Riggs [name]  CHIEF COMPLAINT:  Acute on chronic respiratory failure  BRIEF PATIENT DESCRIPTION: 55 year old female with history of O2 dep COPD and non-compliance with medical therapy who is still actively smoking presenting to the hospital with worsening SOB for a week.  Reports wheezing, cough and increased sputum production with subjective fever and chills.  Patient is on BiPAP for worsening hypercarbia and unable to provide further history.  SIGNIFICANT EVENTS  12/23 admission to hospital for COPD exacerbation.  STUDIES:  CXR 12/23 without acute disease.  HISTORY OF PRESENT ILLNESS:  55 year old female with history of O2 dep COPD and non-compliance with medical therapy who is still actively smoking presenting to the hospital with worsening SOB for a week.  Reports wheezing, cough and increased sputum production with subjective fever and chills.  Patient is on BiPAP for worsening hypercarbia and unable to provide further history.  SUBJECTIVE: Continues to be on BiPAP, no events overnight, no new complaints, there are some compliance issues with keeping BiPAP on.  VITAL SIGNS: Temp:  [96.6 F (35.9 C)-98.7 F (37.1 C)] 96.6 F (35.9 C) (12/25 0800) Pulse Rate:  [82-111] 82 (12/24 1617) Resp:  [12-26] 14 (12/25 0900) BP: (100-172)/(56-117) 122/77 (12/25 0900) SpO2:  [75 %-100 %] 96 % (12/25 0900) FiO2 (%):  [50 %] 50 % (12/25 0825)  PHYSICAL EXAMINATION: General:  Chronically ill appearing AAF in lethargic on BiPAP. Neuro:  Alert and interactive, moving all ext to command HEENT:  [place]/AT, PERRL, EOM-I and MMM Cardiovascular:  RRR, Nl S1/S2, -M/R/G. Lungs:  Distant and quite BS with no wheezing. Abdomen:  Soft, NT, ND and +BS. Musculoskeletal:  -edema and -tenderness Skin:  Intact.  Recent Labs Lab 09/28/16 0327 09/29/16 0325  09/29/16 0831 09/30/16 0322  NA 137 137  --  137  K 3.9 5.6* 5.4* 5.0  CL 101 101  --  99*  CO2 28 29  --  31  BUN 12 19  --  20  CREATININE 0.59 0.57  --  0.61  GLUCOSE 119* 103*  --  101*    Recent Labs Lab 09/28/16 0327 09/29/16 0325 09/30/16 0322  HGB 12.9 12.7 12.1  HCT 40.2 40.8 39.0  WBC 9.8 5.6 7.5  PLT 250 249 243   Dg Chest Port 1 View  Result Date: 09/29/2016 CLINICAL DATA:  Decreased oxygen saturation this morning. EXAM: PORTABLE CHEST 1 VIEW COMPARISON:  Single view chest 09/28/2016 and 07/01/2014. FINDINGS: The lungs are severely emphysematous but clear. Heart size is normal. No pneumothorax or pleural effusion. Soft tissue expander in the right breast is noted. Surgical clips the right axilla are seen. IMPRESSION: Severe emphysema without acute disease. Electronically Signed   By: Inge Rise M.D.   On: 09/29/2016 09:59   I reviewed CXR myself, no acute disease.  ASSESSMENT / PLAN:  55 year old female with O2 dependent COPD, non-compliant with therapy and actively smoking presenting with COPD exacerbation resulting in acute on chronic respiratory failure requiring mechanical support.  VCD has also been an issue in the past but does not seem to be now.  Acute on chronic respiratory failure:  - Repeat ABG with pH of 7.31  - F/U imaging.  - Attempt to get off BiPAP today.  - Diureses with lasix 20 mg IV q6 x3 doses  - Hold in  the ICU given dependence on BiPAP for now.  Hyperkalemia:  - IVF  - Lasix as above  - BMET in AM  COPD exacerbation:  - Doxy.  - Solumedrol.  - PRN Albuterol.  - Duonebs.  - F/U on cultures.  Hypoxemia:  - Titrate O2 for sat of 88-92%.  Tobacco abuse:  - Smoking cessation.  Discussed with RT and PCCM-NP.  The patient is critically ill with multiple organ systems failure and requires high complexity decision making for assessment and support, frequent evaluation and titration of therapies, application of advanced  monitoring technologies and extensive interpretation of multiple databases.   Critical Care Time devoted to patient care services described in this note is  35  Minutes. This time reflects time of care of this signee Dr Jennet Maduro. This critical care time does not reflect procedure time, or teaching time or supervisory time of PA/NP/Med student/Med Resident etc but could involve care discussion time.  Rush Farmer, M.D. [facility] Pulmonary/Critical Care Medicine. Pager: [phone]. After hours pager: [phone].  09/30/2016, 10:47 AM

## 2016-10-01 ENCOUNTER — Inpatient Hospital Stay (HOSPITAL_COMMUNITY): Payer: Medicaid Other

## 2016-10-01 ENCOUNTER — Encounter (HOSPITAL_COMMUNITY): Payer: Self-pay | Admitting: Radiology

## 2016-10-01 DIAGNOSIS — J9621 Acute and chronic respiratory failure with hypoxia: Secondary | ICD-10-CM

## 2016-10-01 DIAGNOSIS — R0902 Hypoxemia: Secondary | ICD-10-CM

## 2016-10-01 LAB — BASIC METABOLIC PANEL
ANION GAP: 10 (ref 5–15)
BUN: 21 mg/dL — AB (ref 6–20)
CALCIUM: 9.3 mg/dL (ref 8.9–10.3)
CO2: 37 mmol/L — ABNORMAL HIGH (ref 22–32)
CREATININE: 0.66 mg/dL (ref 0.44–1.00)
Chloride: 94 mmol/L — ABNORMAL LOW (ref 101–111)
GFR calc Af Amer: >60 mL/min (ref >60)
GLUCOSE: 122 mg/dL — AB (ref 65–99)
Potassium: 4.1 mmol/L (ref 3.5–5.1)
Sodium: 141 mmol/L (ref 135–145)

## 2016-10-01 LAB — CBC
HCT: 41.2 % (ref 36.0–46.0)
Hemoglobin: 13.1 g/dL (ref 12.0–15.0)
MCH: 27.1 pg (ref 26.0–34.0)
MCHC: 31.8 g/dL (ref 30.0–36.0)
MCV: 85.1 fL (ref 78.0–100.0)
PLATELETS: 263 10*3/uL (ref 150–400)
RBC: 4.84 MIL/uL (ref 3.87–5.11)
RDW: 16.7 % — AB (ref 11.5–15.5)
WBC: 6.8 10*3/uL (ref 4.0–10.5)

## 2016-10-01 LAB — MAGNESIUM: MAGNESIUM: 1.9 mg/dL (ref 1.7–2.4)

## 2016-10-01 LAB — PHOSPHORUS: Phosphorus: 4.4 mg/dL (ref 2.5–4.6)

## 2016-10-01 MED ORDER — FUROSEMIDE 10 MG/ML IJ SOLN
40.0000 mg | Freq: Two times a day (BID) | INTRAMUSCULAR | Status: DC
  Administered 2016-10-01 – 2016-10-02 (×4): 40 mg via INTRAVENOUS
  Filled 2016-10-01 (×4): qty 4

## 2016-10-01 MED ORDER — IOPAMIDOL (ISOVUE-370) INJECTION 76%
INTRAVENOUS | Status: AC
  Administered 2016-10-01: 100 mL via INTRAVENOUS
  Filled 2016-10-01: qty 100

## 2016-10-01 NOTE — Progress Notes (Signed)
PROGRESS NOTE    Courtney Terry  [id number] DOB: 1961-08-07 DOA: 09/28/2016 PCP: Pcp Not In System    Brief Narrative:  Patient is a 54 year old female history of chronic respiratory failure on 2 L nasal cannula oxygen with ongoing tobacco abuse, bipolar disorder, history of breast cancer, history of polysubstance abuse presenting to the ED with subjective fevers, chills, wheezing, productive cough and worsening respiratory status. Patient admitted for COPD exacerbation and placed on the BiPAP. Patient difficult to wean off BiPAP and goes into respiratory distress when BiPAP as been taken off. Pulmonary and critical care consulted.   Assessment & Plan:   Principal Problem:   Acute on chronic respiratory failure with hypoxia (HCC) Active Problems:   COPD exacerbation (HCC)   Rhinovirus infection   Breast cancer of upper-outer quadrant of right female breast ([place])   COPD GOLD IV with reversibility/ still smoking   Cigarette smoker   Protein-calorie malnutrition, severe   Schizophrenia (HCC)   Cocaine abuse   Hyperkalemia   Hypothyroidism   Bipolar I disorder (HCC)   Respiratory acidosis   Acute encephalopathy  #1 acute on chronic respiratory failure with hypoxia and hypercarbia secondary to acute COPD exacerbation Likely secondary to acute COPD exacerbation likely triggered by rhinovirus. RSV panel positive for rhinovirus.. Chest x-ray on admission negative for any acute infiltrate. Patient with increasing respiratory distress and as such has been on the BiPAP since admission. Patient was tried off the BiPAP yesterday however with worsening respiratory status was placed back on the BiPAP. Trial of BiPAP done again this morning however the patient would respiratory distress and not to be placed back on the BiPAP. Influenza A PCR negative. Continue on droplet precautions. Patient given IV Lasix yesterday per PCCM with a urine output of 960 mL that was recorded over the past 24  hours. Continue IV doxycycline, IV Solu-Medrol taper, scheduled nebulizers, Pulmicort, Claritin, PPI, Flonase. Trial of BiPAP today however will defer to PCCM. PCCM following and appreciate input and recommendations.??? CT chest due to difficulty weaning patient off BiPAP, will defer to PCCM.  #2 acute COPD exacerbation Patient with chronic respiratory failure on home O2 with ongoing tobacco abuse. Presented to the ED with worsening shortness of breath wheezing, productive cough. Chest x-ray negative for any acute infiltrate. Repeat chest x-ray from 09/29/2016 and 10/01/2016 with severe COPD. RSV panel positive for rhinovirus. Influenza A PCR negative. Droplet precautions. Continue IV doxycycline, IV Solu-Medrol taper, scheduled nebulizers, Pulmicort, PPI, Claritin, Flonase. BIPAP. PCCM ff.  #3 tobacco abuse Tobacco cessation. Continue nicotine patch.  #4 history of breast cancer Continue Arimidex. Outpatient follow-up.  #5 hypothyroidism TSH 0.583. Continue home dose Synthroid.  #6 bipolar disorder Continue Depakote.  #7 hyperkalemia Improved.   #8 severe protein calorie malnutrition Once patient is off the BiPAP with clinical improvement and tolerating oral intake will consult with dietitian and place on nutritional supplementation.   DVT prophylaxis: SCDs. Code Status: Full Family Communication: Updated patient. No family at bedside. Disposition Plan: Remain in the step down unit as patient unable to be weaned off BiPAP.   Consultants:   PCCM: Dr Nelda Marseille 09/28/2016  Procedures:   Chest x-ray 09/28/2016, 09/29/2016, 10/01/2016  Antimicrobials:   IV doxycycline 09/28/2016   Subjective: On BiPAP. Patient sleeping, easily arousable. Patient denies any chest pain. Patient asking for liquid diet. Per nursing respiratory tried to wean patient off BiPAP this morning however patient with significant respiratory distress and had to be placed back on the  BiPAP.  Objective: Vitals:   10/01/16 0728 10/01/16 0800 10/01/16 0817 10/01/16 0900  BP: 101/73   122/79  Pulse:      Resp: 12   18  Temp:  97.2 F (36.2 C)    TempSrc:  Oral    SpO2: 97%  97% 98%  Weight:      Height:        Intake/Output Summary (Last 24 hours) at 10/01/16 0923 Last data filed at 10/01/16 0000  Gross per 24 hour  Intake              650 ml  Output              960 ml  Net             -310 ml   Filed Weights   09/28/16 0241 09/30/16 2000 10/01/16 0500  Weight: 59 kg (130 lb) 53.9 kg (118 lb 13.3 oz) 51.3 kg (113 lb 1.5 oz)    Examination:  General exam: On Bipap. Respiratory system: Poor to fair air movement. Decreased Expiratory wheezing anterior lung fields. No crackles. Cardiovascular system: S1 & S2 heard, RRR. No JVD, murmurs, rubs, gallops or clicks. No pedal edema. Gastrointestinal system: Abdomen is nondistended, soft and nontender. No organomegaly or masses felt. Normal bowel sounds heard. Central nervous system: Alert and oriented. No focal neurological deficits. Extremities: Symmetric 5 x 5 power. Skin: No rashes, lesions or ulcers Psychiatry: Unable to assess.    Data Reviewed: I have personally reviewed following labs and imaging studies  CBC:  Recent Labs Lab 09/28/16 0327 09/29/16 0325 09/30/16 0322 10/01/16 0313  WBC 9.8 5.6 7.5 6.8  NEUTROABS 9.1*  --   --   --   HGB 12.9 12.7 12.1 13.1  HCT 40.2 40.8 39.0 41.2  MCV 84.3 85.7 86.1 85.1  PLT 250 249 243 [id number]   Basic Metabolic Panel:  Recent Labs Lab 09/28/16 0327 09/29/16 0325 09/29/16 0831 09/30/16 0322 10/01/16 0313  NA 137 137  --  137 141  K 3.9 5.6* 5.4* 5.0 4.1  CL 101 101  --  99* 94*  CO2 28 29  --  31 37*  GLUCOSE 119* 103*  --  101* 122*  BUN 12 19  --  20 21*  CREATININE 0.59 0.57  --  0.61 0.66  CALCIUM 8.6* 9.0  --  8.8* 9.3  MG  --   --   --  1.9 1.9  PHOS  --   --   --   --  4.4   GFR: Estimated Creatinine Clearance: 65.1 mL/min (by C-G  formula based on SCr of 0.66 mg/dL). Liver Function Tests:  Recent Labs Lab 09/29/16 0325  AST 20  ALT 15  ALKPHOS 91  BILITOT 0.5  PROT 6.7  ALBUMIN 3.8   No results for input(s): LIPASE, AMYLASE in the last 168 hours. No results for input(s): AMMONIA in the last 168 hours. Coagulation Profile: No results for input(s): INR, PROTIME in the last 168 hours. Cardiac Enzymes: No results for input(s): CKTOTAL, CKMB, CKMBINDEX, TROPONINI in the last 168 hours. BNP (last 3 results) No results for input(s): PROBNP in the last 8760 hours. HbA1C: No results for input(s): HGBA1C in the last 72 hours. CBG:  Recent Labs Lab 09/28/16 1815  GLUCAP 129*   Lipid Profile: No results for input(s): CHOL, HDL, LDLCALC, TRIG, CHOLHDL, LDLDIRECT in the last 72 hours. Thyroid Function Tests:  Recent Labs  09/30/16 0322  TSH 0.583  Anemia Panel: No results for input(s): VITAMINB12, FOLATE, FERRITIN, TIBC, IRON, RETICCTPCT in the last 72 hours. Sepsis Labs: No results for input(s): PROCALCITON, LATICACIDVEN in the last 168 hours.  Recent Results (from the past 240 hour(s))  MRSA PCR Screening     Status: None   Collection Time: 09/28/16  1:42 PM  Result Value Ref Range Status   MRSA by PCR NEGATIVE NEGATIVE Final    Comment:        The GeneXpert MRSA Assay (FDA approved for NASAL specimens only), is one component of a comprehensive MRSA colonization surveillance program. It is not intended to diagnose MRSA infection nor to guide or monitor treatment for MRSA infections.   Respiratory Panel by PCR     Status: Abnormal   Collection Time: 09/29/16 11:07 AM  Result Value Ref Range Status   Adenovirus NOT DETECTED NOT DETECTED Final   Coronavirus 229E NOT DETECTED NOT DETECTED Final   Coronavirus HKU1 NOT DETECTED NOT DETECTED Final   Coronavirus NL63 NOT DETECTED NOT DETECTED Final   Coronavirus OC43 NOT DETECTED NOT DETECTED Final   Metapneumovirus NOT DETECTED NOT DETECTED  Final   Rhinovirus / Enterovirus DETECTED (A) NOT DETECTED Final   Influenza A NOT DETECTED NOT DETECTED Final   Influenza B NOT DETECTED NOT DETECTED Final   Parainfluenza Virus 1 NOT DETECTED NOT DETECTED Final   Parainfluenza Virus 2 NOT DETECTED NOT DETECTED Final   Parainfluenza Virus 3 NOT DETECTED NOT DETECTED Final   Parainfluenza Virus 4 NOT DETECTED NOT DETECTED Final   Respiratory Syncytial Virus NOT DETECTED NOT DETECTED Final   Bordetella pertussis NOT DETECTED NOT DETECTED Final   Chlamydophila pneumoniae NOT DETECTED NOT DETECTED Final   Mycoplasma pneumoniae NOT DETECTED NOT DETECTED Final    Comment: Performed at [facility]         Radiology Studies: Dg Chest Port 1 View  Result Date: 10/01/2016 CLINICAL DATA:  Shortness of breath, hypoxia, history asthma, COPD, RIGHT breast cancer EXAM: PORTABLE CHEST 1 VIEW COMPARISON:  Portable exam 0747 hours compared to 09/29/2016 FINDINGS: Normal heart size, mediastinal contours, and pulmonary vascularity. Lungs emphysematous but clear. No pleural effusion or pneumothorax. LEFT breast prosthesis and RIGHT breast tissue expander. Surgical clips RIGHT axilla. Bones demineralized. IMPRESSION: COPD changes without acute infiltrate. Electronically Signed   By: Lavonia Dana M.D.   On: 10/01/2016 08:12        Scheduled Meds: . amLODipine  2.5 mg Oral Daily  . anastrozole  1 mg Oral Daily  . budesonide (PULMICORT) nebulizer solution  0.25 mg Nebulization BID  . doxycycline (VIBRAMYCIN) IV  100 mg Intravenous Q12H  . FLUoxetine  20 mg Oral Daily  . fluticasone  2 spray Each Nare Daily  . heparin  5,000 Units Subcutaneous Q8H  . ipratropium-albuterol  3 mL Nebulization Q4H  . levothyroxine  37.5 mcg Intravenous QAC breakfast  . loratadine  10 mg Oral Daily  . methylPREDNISolone sodium succinate  125 mg Intravenous Q8H  . nicotine  14 mg Transdermal Daily  . paliperidone  9 mg Oral QHS  . pantoprazole (PROTONIX) IV  40  mg Intravenous Q24H  . valproate sodium  500 mg Intravenous Q12H   Continuous Infusions: . sodium chloride 10 mL/hr at 10/01/16 0000  . albuterol Stopped (09/28/16 0459)     LOS: 3 days    Time spent: 22 mins    Laura Caldas, MD [facility] Pager [phone]  If 7PM-7AM, please contact night-coverage [email or web address] Password [email or web address]  10/01/2016, 9:23 AM

## 2016-10-01 NOTE — Progress Notes (Signed)
Name: Courtney Terry MRN: [id number] DOB: May 05, 1961    ADMISSION DATE:  09/28/2016 CONSULTATION DATE:  09/28/2016  REFERRING MD :  Nile Riggs [name]  CHIEF COMPLAINT:  Acute on chronic respiratory failure  BRIEF PATIENT DESCRIPTION: 55 year old female with history of O2 dep COPD (FEV 23% predicted in 2014) and non-compliance with medical therapy who is still actively smoking presenting to the hospital with worsening SOB for a week.  Reports wheezing, cough and increased sputum production with subjective fever and chills.  Patient is on BiPAP for worsening hypercarbia and unable to provide further history.  SIGNIFICANT EVENTS  12/23 admission to hospital for COPD exacerbation.  STUDIES:  CXR 12/23 without acute disease. RVP 12/24: Positive for rhinovirus  HISTORY OF PRESENT ILLNESS:  55 year old female with history of O2 dep COPD and non-compliance with medical therapy who is still actively smoking presenting to the hospital with worsening SOB for a week.  Reports wheezing, cough and increased sputum production with subjective fever and chills.  Patient is on BiPAP for worsening hypercarbia and unable to provide further history.  SUBJECTIVE: Continues to be on BiPAP, Did not tolerate getting off it today AM. No events overnight, no new complaints.  VITAL SIGNS: Temp:  [96.1 F (35.6 C)-99.1 F (37.3 C)] 97.2 F (36.2 C) (12/26 0800) Pulse Rate:  [86-88] 88 (12/26 0340) Resp:  [8-32] 18 (12/26 0900) BP: (81-147)/(46-87) 122/79 (12/26 0900) SpO2:  [92 %-100 %] 98 % (12/26 0900) FiO2 (%):  [50 %] 50 % (12/26 0817) Weight:  [51.3 kg (113 lb 1.5 oz)-53.9 kg (118 lb 13.3 oz)] 51.3 kg (113 lb 1.5 oz) (12/26 0500)  PHYSICAL EXAMINATION: General:  Chronically ill appearing AAF on Bipap Neuro:  Alert and interactive, moving all ext to command HEENT:  [place]/AT, PERRL, EOM-I and MMM Cardiovascular:  RRR, Nl S1/S2, No MRG Lungs:  Diminished breath sounds, no wheeze Abdomen:  Soft, NT, ND  and +BS. Musculoskeletal:  -edema and -tenderness Skin:  Intact.  Recent Labs Lab 09/29/16 0325 09/29/16 0831 09/30/16 0322 10/01/16 0313  NA 137  --  137 141  K 5.6* 5.4* 5.0 4.1  CL 101  --  99* 94*  CO2 29  --  31 37*  BUN 19  --  20 21*  CREATININE 0.57  --  0.61 0.66  GLUCOSE 103*  --  101* 122*    Recent Labs Lab 09/29/16 0325 09/30/16 0322 10/01/16 0313  HGB 12.7 12.1 13.1  HCT 40.8 39.0 41.2  WBC 5.6 7.5 6.8  PLT 249 243 263   Dg Chest Port 1 View  Result Date: 10/01/2016 CLINICAL DATA:  Shortness of breath, hypoxia, history asthma, COPD, RIGHT breast cancer EXAM: PORTABLE CHEST 1 VIEW COMPARISON:  Portable exam [id number] hours compared to 09/29/2016 FINDINGS: Normal heart size, mediastinal contours, and pulmonary vascularity. Lungs emphysematous but clear. No pleural effusion or pneumothorax. LEFT breast prosthesis and RIGHT breast tissue expander. Surgical clips RIGHT axilla. Bones demineralized. IMPRESSION: COPD changes without acute infiltrate. Electronically Signed   By: Lavonia Dana M.D.   On: 10/01/2016 08:12  Images personally reviewed.  ASSESSMENT / PLAN: 55 year old female with O2 dependent COPD, non-compliant with therapy and actively smoking presenting with COPD exacerbation resulting in acute on chronic respiratory failure requiring mechanical support.  VCD has also been an issue in the past but does not seem to be now.  Acute on chronic respiratory failure:  - F/U imaging. Will do CTA to evalaute  for PE given the fact that she is not tolerating coming off the bipap  - Contuinue diureses with lasix.  - Hold in the ICU given dependence on BiPAP for now.  Hyperkalemia:  - Lasix as above  - BMET in AM  COPD exacerbation:  - Doxy.  - Solumedrol.  - PRN Albuterol.  - Duonebs.  - F/U on cultures.  Hypoxemia:  - Titrate O2 for sat of 88-92%.  Tobacco abuse:  - Smoking cessation.  Critical care time - 35 mins.  Marshell Garfinkel MD [facility] Pager [phone] If no answer or after 3pm call: [phone] 10/01/2016, 10:12 AM

## 2016-10-01 NOTE — Progress Notes (Signed)
Droplet Precautions discontinued per Infectious Disease.

## 2016-10-02 DIAGNOSIS — E872 Acidosis: Secondary | ICD-10-CM

## 2016-10-02 LAB — CBC
HCT: 44.6 % (ref 36.0–46.0)
HEMOGLOBIN: 13.8 g/dL (ref 12.0–15.0)
MCH: 26.5 pg (ref 26.0–34.0)
MCHC: 30.9 g/dL (ref 30.0–36.0)
MCV: 85.8 fL (ref 78.0–100.0)
PLATELETS: 256 10*3/uL (ref 150–400)
RBC: 5.2 MIL/uL — AB (ref 3.87–5.11)
RDW: 16.5 % — ABNORMAL HIGH (ref 11.5–15.5)
WBC: 6.1 10*3/uL (ref 4.0–10.5)

## 2016-10-02 LAB — BASIC METABOLIC PANEL
Anion gap: 8 (ref 5–15)
BUN: 22 mg/dL — AB (ref 6–20)
CHLORIDE: 87 mmol/L — AB (ref 101–111)
CO2: 43 mmol/L — ABNORMAL HIGH (ref 22–32)
CREATININE: 0.62 mg/dL (ref 0.44–1.00)
Calcium: 9 mg/dL (ref 8.9–10.3)
Glucose, Bld: 120 mg/dL — ABNORMAL HIGH (ref 65–99)
POTASSIUM: 4.1 mmol/L (ref 3.5–5.1)
SODIUM: 138 mmol/L (ref 135–145)

## 2016-10-02 LAB — EXPECTORATED SPUTUM ASSESSMENT W REFEX TO RESP CULTURE

## 2016-10-02 LAB — EXPECTORATED SPUTUM ASSESSMENT W GRAM STAIN, RFLX TO RESP C

## 2016-10-02 MED ORDER — DIPHENHYDRAMINE HCL 25 MG PO CAPS
25.0000 mg | ORAL_CAPSULE | Freq: Every evening | ORAL | Status: DC | PRN
  Administered 2016-10-02 – 2016-10-08 (×4): 25 mg via ORAL
  Filled 2016-10-02 (×4): qty 1

## 2016-10-02 MED ORDER — DIVALPROEX SODIUM 250 MG PO DR TAB
500.0000 mg | DELAYED_RELEASE_TABLET | Freq: Two times a day (BID) | ORAL | Status: DC
  Administered 2016-10-02 – 2016-10-09 (×6): 500 mg via ORAL
  Filled 2016-10-02 (×11): qty 2

## 2016-10-02 MED ORDER — LEVOTHYROXINE SODIUM 75 MCG PO TABS
75.0000 ug | ORAL_TABLET | Freq: Every day | ORAL | Status: DC
  Administered 2016-10-03 – 2016-10-09 (×7): 75 ug via ORAL
  Filled 2016-10-02 (×7): qty 1

## 2016-10-02 MED ORDER — PANTOPRAZOLE SODIUM 40 MG PO TBEC
40.0000 mg | DELAYED_RELEASE_TABLET | Freq: Every day | ORAL | Status: DC
  Administered 2016-10-03 – 2016-10-09 (×7): 40 mg via ORAL
  Filled 2016-10-02 (×7): qty 1

## 2016-10-02 MED ORDER — DOXYCYCLINE HYCLATE 100 MG PO TABS
100.0000 mg | ORAL_TABLET | Freq: Two times a day (BID) | ORAL | Status: DC
  Administered 2016-10-02 – 2016-10-04 (×5): 100 mg via ORAL
  Filled 2016-10-02 (×5): qty 1

## 2016-10-02 NOTE — Progress Notes (Signed)
On BIPAP much of day.Significant WOB. Looks breathless.

## 2016-10-02 NOTE — Progress Notes (Signed)
2259 Pt switched to Venturi Mask 15L for PO medication administration. Pt is maintaining oxygen saturation. RT made aware. Will continue to monitor.

## 2016-10-02 NOTE — Progress Notes (Signed)
PHARMACIST - PHYSICIAN COMMUNICATION  DR:   Adair Patter  CONCERNING: IV to Oral Route Change Policy  RECOMMENDATION: This patient is receiving Doxycycline, Protonix, Depakote, Benadryl & Synthroid by the intravenous route.  Based on criteria approved by the Pharmacy and Therapeutics Committee, the intravenous medication(s) is/are being converted to the equivalent oral dose form(s).   DESCRIPTION: These criteria include:  The patient is eating (either orally or via tube) and/or has been taking other orally administered medications for a least 24 hours  The patient has no evidence of active gastrointestinal bleeding or impaired GI absorption (gastrectomy, short bowel, patient on TNA or NPO).  If you have questions about this conversion, please contact the Pharmacy Department  []   [phone] )  [facility] []   [phone] )  [facility] []   [phone] )  [facility] []   [phone] )  [facility] [x]   [phone] )  [place], [place], [facility] 10/02/2016 1:58 PM

## 2016-10-02 NOTE — Progress Notes (Signed)
PROGRESS NOTE    EMBYR SIM  [id number] DOB: 1961-04-15 DOA: 09/28/2016 PCP: Pcp Not In System    Brief Narrative:  Patient is a 55 year old female history of chronic respiratory failure on 2 L nasal cannula oxygen with ongoing tobacco abuse, bipolar disorder, history of breast cancer, history of polysubstance abuse presenting to the ED with subjective fevers, chills, wheezing, productive cough and worsening respiratory status. Patient admitted for COPD exacerbation and placed on the BiPAP. Pulmonary and critical care consulted.    Assessment & Plan:   Principal Problem:   Acute on chronic respiratory failure with hypoxia (HCC) Active Problems:   Breast cancer of upper-outer quadrant of right female breast (HCC)   COPD GOLD IV with reversibility/ still smoking   Cigarette smoker   COPD exacerbation (HCC)   Protein-calorie malnutrition, severe   Schizophrenia (HCC)   Cocaine abuse   Hyperkalemia   Hypothyroidism   Bipolar I disorder (HCC)   Respiratory acidosis   Acute encephalopathy   Rhinovirus infection   Hypoxia   Acute on chronic respiratory failure with hypoxia and hypercarbia secondary to acute COPD exacerbation - secondary to acute COPD exacerbation - Chest x-ray on admission negative for any acute infiltrate - continues to be on BiPap - Respiratory virus positive for rhinovirus - Influenza PCR negative - Continuing Doxycycline, Solumedrol, scheduled nebulizers - PCCM following - Patient did not tolerate being off BiPap this am for more than 1 hour - Appreciate PCCM input and recommendations - CTA negative yesterday for PE - repeat CXR yesterday showing COPD changes without acute infiltrates - may consider d/c doxycycline   Acute COPD exacerbation - Chronic respiratory failure on home O2 with ongoing tobacco abuse - Presented to the ED with worsening shortness of breath wheezing, productive cough - Chest x-ray negative for any acute infiltrate -  Rhinovirus Positive, Influenza Negative - Continue IV doxycycline, IV Solu-Medrol, scheduled nebulizers - Added Pulmicort, PPI, Claritin, Flonase  Tobacco abuse -Tobacco cessation - Nicotine patch.  History of breast cancer - Continue Arimidex - Outpatient follow-up.  Hypothyroidism - TSH of 0.583 - Continue home dose Synthroid.  Bipolar disorder - Continue Depakote.  Hyperkalemia - Replete potassium level - K WNL today  Severe protein calorie malnutrition - Consult with dietitian and place on nutritional supplementation once tolerating PO and off BiPap   DVT prophylaxis: SCDs. Code Status: Full Family Communication: husband is bedside Disposition Plan: Remain in the step down unit.  Consultants:   PCCM  Procedures:   None  Antimicrobials:   Doxycycline IV 12/23>   Subjective: Patient required BiPap overnight and the rest of the day yesterday.  She was off BiPap this morning at time of exam but then asked to be put back on at cessation of exam.  Reports no real difference/ improvement in respiratory status since yesterday.  Slept well.  Able to eat minimal yesterday.  Objective: Vitals:   10/02/16 0400 10/02/16 0420 10/02/16 0600 10/02/16 0800  BP: 104/64 104/64 131/84   Pulse:  83  89  Resp: 19 14 13 16   Temp:      TempSrc:      SpO2: 95% 94% 95% 95%  Weight:      Height:        Intake/Output Summary (Last 24 hours) at 10/02/16 [id number] Last data filed at 10/02/16 0600  Gross per 24 hour  Intake             1210 ml  Output  2951 ml  Net            -1741 ml   Filed Weights   09/28/16 0241 09/30/16 2000 10/01/16 0500  Weight: 59 kg (130 lb) 53.9 kg (118 lb 13.3 oz) 51.3 kg (113 lb 1.5 oz)    Examination:  General exam: Sitting up, leaning forward, slightly anxious, thin, frail appearing female Respiratory system: Increased respiratory effort, diffuse expiratory wheezing in all lung fields bilaterally Cardiovascular system: S1  & S2 heard, RRR. No JVD, murmurs, rubs, gallops or clicks. No pedal edema. Gastrointestinal system: Abdomen is nondistended, soft and nontender. No organomegaly or masses felt. Normal bowel sounds heard. Central nervous system: Alert and oriented. No focal neurological deficits. Extremities: Symmetric 5 x 5 power. Skin: No rashes, lesions or ulcers Psychiatry: Judgement and insight appear normal. Mood & affect appropriate.     Data Reviewed: I have personally reviewed following labs and imaging studies  CBC:  Recent Labs Lab 09/28/16 0327 09/29/16 0325 09/30/16 0322 10/01/16 0313 10/02/16 0349  WBC 9.8 5.6 7.5 6.8 6.1  NEUTROABS 9.1*  --   --   --   --   HGB 12.9 12.7 12.1 13.1 13.8  HCT 40.2 40.8 39.0 41.2 44.6  MCV 84.3 85.7 86.1 85.1 85.8  PLT 250 249 243 263 [id number]   Basic Metabolic Panel:  Recent Labs Lab 09/28/16 0327 09/29/16 0325 09/29/16 0831 09/30/16 0322 10/01/16 0313 10/02/16 0349  NA 137 137  --  137 141 138  K 3.9 5.6* 5.4* 5.0 4.1 4.1  CL 101 101  --  99* 94* 87*  CO2 28 29  --  31 37* 43*  GLUCOSE 119* 103*  --  101* 122* 120*  BUN 12 19  --  20 21* 22*  CREATININE 0.59 0.57  --  0.61 0.66 0.62  CALCIUM 8.6* 9.0  --  8.8* 9.3 9.0  MG  --   --   --  1.9 1.9  --   PHOS  --   --   --   --  4.4  --    GFR: Estimated Creatinine Clearance: 65.1 mL/min (by C-G formula based on SCr of 0.62 mg/dL). Liver Function Tests:  Recent Labs Lab 09/29/16 0325  AST 20  ALT 15  ALKPHOS 91  BILITOT 0.5  PROT 6.7  ALBUMIN 3.8   No results for input(s): LIPASE, AMYLASE in the last 168 hours. No results for input(s): AMMONIA in the last 168 hours. Coagulation Profile: No results for input(s): INR, PROTIME in the last 168 hours. Cardiac Enzymes: No results for input(s): CKTOTAL, CKMB, CKMBINDEX, TROPONINI in the last 168 hours. BNP (last 3 results) No results for input(s): PROBNP in the last 8760 hours. HbA1C: No results for input(s): HGBA1C in the last 72  hours. CBG:  Recent Labs Lab 09/28/16 1815  GLUCAP 129*   Lipid Profile: No results for input(s): CHOL, HDL, LDLCALC, TRIG, CHOLHDL, LDLDIRECT in the last 72 hours. Thyroid Function Tests:  Recent Labs  09/30/16 0322  TSH 0.583   Anemia Panel: No results for input(s): VITAMINB12, FOLATE, FERRITIN, TIBC, IRON, RETICCTPCT in the last 72 hours. Sepsis Labs: No results for input(s): PROCALCITON, LATICACIDVEN in the last 168 hours.  Recent Results (from the past 240 hour(s))  MRSA PCR Screening     Status: None   Collection Time: 09/28/16  1:42 PM  Result Value Ref Range Status   MRSA by PCR NEGATIVE NEGATIVE Final    Comment:  The GeneXpert MRSA Assay (FDA approved for NASAL specimens only), is one component of a comprehensive MRSA colonization surveillance program. It is not intended to diagnose MRSA infection nor to guide or monitor treatment for MRSA infections.   Respiratory Panel by PCR     Status: Abnormal   Collection Time: 09/29/16 11:07 AM  Result Value Ref Range Status   Adenovirus NOT DETECTED NOT DETECTED Final   Coronavirus 229E NOT DETECTED NOT DETECTED Final   Coronavirus HKU1 NOT DETECTED NOT DETECTED Final   Coronavirus NL63 NOT DETECTED NOT DETECTED Final   Coronavirus OC43 NOT DETECTED NOT DETECTED Final   Metapneumovirus NOT DETECTED NOT DETECTED Final   Rhinovirus / Enterovirus DETECTED (A) NOT DETECTED Final   Influenza A NOT DETECTED NOT DETECTED Final   Influenza B NOT DETECTED NOT DETECTED Final   Parainfluenza Virus 1 NOT DETECTED NOT DETECTED Final   Parainfluenza Virus 2 NOT DETECTED NOT DETECTED Final   Parainfluenza Virus 3 NOT DETECTED NOT DETECTED Final   Parainfluenza Virus 4 NOT DETECTED NOT DETECTED Final   Respiratory Syncytial Virus NOT DETECTED NOT DETECTED Final   Bordetella pertussis NOT DETECTED NOT DETECTED Final   Chlamydophila pneumoniae NOT DETECTED NOT DETECTED Final   Mycoplasma pneumoniae NOT DETECTED NOT  DETECTED Final    Comment: Performed at [facility]         Radiology Studies: Ct Angio Chest Pe W Or Wo Contrast  Result Date: 10/01/2016 CLINICAL DATA:  Shortness of Breath EXAM: CT ANGIOGRAPHY CHEST WITH CONTRAST TECHNIQUE: Multidetector CT imaging of the chest was performed using the standard protocol during bolus administration of intravenous contrast. Multiplanar CT image reconstructions and MIPs were obtained to evaluate the vascular anatomy. CONTRAST:  100 mL Isovue 370 IV COMPARISON:  None. FINDINGS: Cardiovascular: Left arm IV contrast administration. Innominate vein and SVC patent. Right atrium and right ventricle nondilated. Satisfactory opacification of pulmonary arteries noted, and there is no evidence of pulmonary emboli. Patent bilateral pulmonary veins drain into the left atrium. Adequate contrast opacification of the thoracic aorta with no evidence of dissection, aneurysm, or stenosis. There is bovine variant brachiocephalic arch anatomy without proximal stenosis. No significant atheromatous plaque. Mediastinum/Nodes: No mediastinal hematoma. No pericardial effusion. No hilar or mediastinal adenopathy. Lungs/Pleura: No pneumothorax. No pleural effusion. Moderate hyperinflation with emphysematous changes most evident in the upper lobes. No nodule or infiltrate. Upper Abdomen: No acute abnormality. Musculoskeletal: No chest wall abnormality. No acute or significant osseous findings. Review of the MIP images confirms the above findings. IMPRESSION: 1. Negative for acute PE or thoracic aortic dissection. 2. Hyperinflation and upper lobe parenchymal changes consistent with COPD. Electronically Signed   By: Lucrezia Europe M.D.   On: 10/01/2016 14:28   Dg Chest Port 1 View  Result Date: 10/01/2016 CLINICAL DATA:  Shortness of breath, hypoxia, history asthma, COPD, RIGHT breast cancer EXAM: PORTABLE CHEST 1 VIEW COMPARISON:  Portable exam 0747 hours compared to 09/29/2016 FINDINGS:  Normal heart size, mediastinal contours, and pulmonary vascularity. Lungs emphysematous but clear. No pleural effusion or pneumothorax. LEFT breast prosthesis and RIGHT breast tissue expander. Surgical clips RIGHT axilla. Bones demineralized. IMPRESSION: COPD changes without acute infiltrate. Electronically Signed   By: Lavonia Dana M.D.   On: 10/01/2016 08:12        Scheduled Meds: . amLODipine  2.5 mg Oral Daily  . anastrozole  1 mg Oral Daily  . budesonide (PULMICORT) nebulizer solution  0.25 mg Nebulization BID  . doxycycline (VIBRAMYCIN) IV  100  mg Intravenous Q12H  . FLUoxetine  20 mg Oral Daily  . fluticasone  2 spray Each Nare Daily  . furosemide  40 mg Intravenous Q12H  . heparin  5,000 Units Subcutaneous Q8H  . ipratropium-albuterol  3 mL Nebulization Q4H  . levothyroxine  37.5 mcg Intravenous QAC breakfast  . loratadine  10 mg Oral Daily  . methylPREDNISolone sodium succinate  125 mg Intravenous Q8H  . nicotine  14 mg Transdermal Daily  . paliperidone  9 mg Oral QHS  . pantoprazole (PROTONIX) IV  40 mg Intravenous Q24H  . valproate sodium  500 mg Intravenous Q12H   Continuous Infusions: . sodium chloride 10 mL/hr at 10/01/16 0000  . albuterol Stopped (09/28/16 0459)     LOS: 4 days    Time spent: 30 minutes    Loretha Stapler, MD [facility] Pager [phone]  If 7PM-7AM, please contact night-coverage [email or web address] Password [email or web address] 10/02/2016, 8:12 AM

## 2016-10-02 NOTE — Progress Notes (Signed)
Name: Courtney Terry MRN: [id number] DOB: 1961-09-15    ADMISSION DATE:  09/28/2016 CONSULTATION DATE:  09/28/2016  REFERRING MD :  Nile Riggs [name]  CHIEF COMPLAINT:  Acute on chronic respiratory failure  BRIEF PATIENT DESCRIPTION: 55 year old female with history of O2 dep COPD (FEV 23% predicted in 2014) and non-compliance with medical therapy who is still actively smoking presenting to the hospital with worsening SOB for a week.  Reports wheezing, cough and increased sputum production with subjective fever and chills.  Patient is on BiPAP for worsening hypercarbia and unable to provide further history.  SIGNIFICANT EVENTS  12/23 admission to hospital for COPD exacerbation.  STUDIES:  CXR 12/23 without acute disease. RVP 12/24: Positive for rhinovirus CTA 12/2: Emphysematous changes, No acute PE or infiltrate  HISTORY OF PRESENT ILLNESS:  55 year old female with history of O2 dep COPD and non-compliance with medical therapy who is still actively smoking presenting to the hospital with worsening SOB for a week.  Reports wheezing, cough and increased sputum production with subjective fever and chills.  Patient is on BiPAP for worsening hypercarbia and unable to provide further history.  SUBJECTIVE:  No events overnight, no new complaints. Off bipap today AM.  VITAL SIGNS: Temp:  [97 F (36.1 C)-98.1 F (36.7 C)] 98.1 F (36.7 C) (12/27 0800) Pulse Rate:  [83-109] 89 (12/27 0800) Resp:  [7-19] 16 (12/27 0800) BP: (104-152)/(64-95) 111/79 (12/27 0800) SpO2:  [91 %-99 %] 95 % (12/27 0800) FiO2 (%):  [45 %-50 %] 50 % (12/27 0800)  PHYSICAL EXAMINATION: General:  Chronically ill appearing african [place] female on Bipap Neuro:  Alert and interactive, moving all ext to command HEENT:  Moist mucus membranes, No thyromegaly, JVD Cardiovascular:  RRR, Nl S1/S2, No MRG Lungs:  Diminished breath sounds, no wheeze Abdomen:  Soft, NT, ND and +BS. Musculoskeletal:  -edema and  -tenderness Skin:  Intact.  Recent Labs Lab 09/30/16 0322 10/01/16 0313 10/02/16 0349  NA 137 141 138  K 5.0 4.1 4.1  CL 99* 94* 87*  CO2 31 37* 43*  BUN 20 21* 22*  CREATININE 0.61 0.66 0.62  GLUCOSE 101* 122* 120*    Recent Labs Lab 09/30/16 0322 10/01/16 0313 10/02/16 0349  HGB 12.1 13.1 13.8  HCT 39.0 41.2 44.6  WBC 7.5 6.8 6.1  PLT 243 263 256   Ct Angio Chest Pe W Or Wo Contrast  Result Date: 10/01/2016 CLINICAL DATA:  Shortness of Breath EXAM: CT ANGIOGRAPHY CHEST WITH CONTRAST TECHNIQUE: Multidetector CT imaging of the chest was performed using the standard protocol during bolus administration of intravenous contrast. Multiplanar CT image reconstructions and MIPs were obtained to evaluate the vascular anatomy. CONTRAST:  100 mL Isovue 370 IV COMPARISON:  None. FINDINGS: Cardiovascular: Left arm IV contrast administration. Innominate vein and SVC patent. Right atrium and right ventricle nondilated. Satisfactory opacification of pulmonary arteries noted, and there is no evidence of pulmonary emboli. Patent bilateral pulmonary veins drain into the left atrium. Adequate contrast opacification of the thoracic aorta with no evidence of dissection, aneurysm, or stenosis. There is bovine variant brachiocephalic arch anatomy without proximal stenosis. No significant atheromatous plaque. Mediastinum/Nodes: No mediastinal hematoma. No pericardial effusion. No hilar or mediastinal adenopathy. Lungs/Pleura: No pneumothorax. No pleural effusion. Moderate hyperinflation with emphysematous changes most evident in the upper lobes. No nodule or infiltrate. Upper Abdomen: No acute abnormality. Musculoskeletal: No chest wall abnormality. No acute or significant osseous findings. Review of the MIP images confirms the above  findings. IMPRESSION: 1. Negative for acute PE or thoracic aortic dissection. 2. Hyperinflation and upper lobe parenchymal changes consistent with COPD. Electronically Signed    By: Lucrezia Europe M.D.   On: 10/01/2016 14:28   Dg Chest Port 1 View  Result Date: 10/01/2016 CLINICAL DATA:  Shortness of breath, hypoxia, history asthma, COPD, RIGHT breast cancer EXAM: PORTABLE CHEST 1 VIEW COMPARISON:  Portable exam 0747 hours compared to 09/29/2016 FINDINGS: Normal heart size, mediastinal contours, and pulmonary vascularity. Lungs emphysematous but clear. No pleural effusion or pneumothorax. LEFT breast prosthesis and RIGHT breast tissue expander. Surgical clips RIGHT axilla. Bones demineralized. IMPRESSION: COPD changes without acute infiltrate. Electronically Signed   By: Lavonia Dana M.D.   On: 10/01/2016 08:12  Images personally reviewed.  ASSESSMENT / PLAN: 55 year old female with O2 dependent COPD, non-compliant with therapy and actively smoking presenting with COPD exacerbation in setting of rhinovirus infection, resulting in acute on chronic respiratory failure requiring mechanical support.  VCD has also been an issue in the past but does not seem to be now. CTA does not show any acute abnormality. Anticipate slow recovery  Issues: Acute on chronic respiratory failure  COPD exacerbation Rhinovirus infection Hyperkalemia- resolved Tobacco abuse  - Contuinue diureses with lasix. - Hold in the ICU given dependence on BiPAP for now. - Lasix. 40 mg IV q12 - BMET in AM - Doxy. - Solumedrol. Continue at current dose. - PRN Albuterol. - Duonebs, pulmicort - Smoking cessation.  Critical care time - 35 mins.  Marshell Garfinkel MD [facility] Pager [phone] If no answer or after 3pm call: [phone] 10/02/2016, 10:10 AM

## 2016-10-03 DIAGNOSIS — J441 Chronic obstructive pulmonary disease with (acute) exacerbation: Principal | ICD-10-CM

## 2016-10-03 MED ORDER — METHYLPREDNISOLONE SODIUM SUCC 125 MG IJ SOLR
80.0000 mg | Freq: Three times a day (TID) | INTRAMUSCULAR | Status: DC
  Administered 2016-10-03 – 2016-10-05 (×6): 80 mg via INTRAVENOUS
  Filled 2016-10-03 (×6): qty 2

## 2016-10-03 NOTE — Progress Notes (Signed)
Poor intake today. Be aware that her female companion eats her food that his delivered to her.  Still looks labored and breathless even on BIPAP.

## 2016-10-03 NOTE — Progress Notes (Signed)
PROGRESS NOTE    Courtney Terry  [id number] DOB: 08-Feb-1961 DOA: 09/28/2016 PCP: Pcp Not In System    Brief Narrative:  Patient is a 55 year old female history of chronic respiratory failure on 2 L nasal cannula oxygen with ongoing tobacco abuse, bipolar disorder, history of breast cancer, history of polysubstance abuse presenting to the ED with subjective fevers, chills, wheezing, productive cough and worsening respiratory status. Patient admitted for COPD exacerbation and placed on the BiPAP. Pulmonary and critical care consulted.    Assessment & Plan:   Principal Problem:   Acute on chronic respiratory failure with hypoxia (HCC) Active Problems:   Breast cancer of upper-outer quadrant of right female breast (HCC)   COPD GOLD IV with reversibility/ still smoking   Cigarette smoker   COPD exacerbation (HCC)   Protein-calorie malnutrition, severe   Schizophrenia (HCC)   Cocaine abuse   Hyperkalemia   Hypothyroidism   Bipolar I disorder (HCC)   Respiratory acidosis   Acute encephalopathy   Rhinovirus infection   Hypoxia   Acute on chronic respiratory failure with hypoxia and hypercarbia secondary to acute COPD exacerbation - secondary to acute COPD exacerbation - Chest x-ray on admission negative for any acute infiltrate - continues to be on BiPap - Respiratory virus positive for rhinovirus - Influenza PCR negative - Continuing Doxycycline, Solumedrol, scheduled nebulizers - PCCM following - Patient on BiPap for majority of day yesterday and was switched to Venturi mask at 15L for PO medication administration but on BiPap this am at time of exam - Appreciate PCCM input and recommendations - CTA 10/01/16 negative for PE - repeat CXR 12/26 showing COPD changes without acute infiltrates - agree with PCCM note concerning patient's severe COPD and possibility of consulting palliative care to discuss goals of care - Sputum culture pending   Acute COPD  exacerbation - Chronic respiratory failure on home O2 with ongoing tobacco abuse - Presented to the ED with worsening shortness of breath wheezing, productive cough - Chest x-ray negative for any acute infiltrate - Rhinovirus Positive, Influenza Negative - Continue IV doxycycline, IV Solu-Medrol, scheduled nebulizers - Added Pulmicort, PPI, Claritin, Flonase - Pulmonology consulted; appreciate their recommendations  Tobacco abuse -Tobacco cessation - Nicotine patch.  History of breast cancer - Continue Arimidex - Outpatient follow-up.  Hypothyroidism - TSH of 0.583 - Continue home dose Synthroid.  Bipolar disorder - Continue Depakote.  Hyperkalemia - Replete potassium level - K WNL yesterday - will repeat BMP in am  Severe protein calorie malnutrition - Consult with dietitian and place on nutritional supplementation once tolerating PO and off BiPap - patient appears very frail and thin   DVT prophylaxis: SCDs. Code Status: Full Family Communication: no family bedside Disposition Plan: Remain in the step down unit.  Consultants:   PCCM  Procedures:   None  Antimicrobials:   Doxycycline IV 12/23>   Subjective: Patient asleep at time of exam.  Reviewing notes from last evening patient was off BiPap for administration of PO medications.   Patient will attempt to be off BiPap for 2 hours at a time today.  She denies any new symptoms of chest pain, chest pressure, abdominal pain, diarrhea, constipation.  Objective: Vitals:   10/03/16 0400 10/03/16 0500 10/03/16 0600 10/03/16 0802  BP: 109/82  116/80   Pulse:      Resp: 12 13 13    Temp:    97.3 F (36.3 C)  TempSrc:    Axillary  SpO2: 95% 90% 91%   Weight:  Height:        Intake/Output Summary (Last 24 hours) at 10/03/16 0807 Last data filed at 10/03/16 0600  Gross per 24 hour  Intake              910 ml  Output             1550 ml  Net             -640 ml   Filed Weights   10/01/16  0500 10/02/16 1049 10/03/16 0100  Weight: 51.3 kg (113 lb 1.5 oz) 54.8 kg (120 lb 13 oz) 47.9 kg (105 lb 9.6 oz)    Examination:  General exam: sleeping in bed with BiPap on, no acute distress noted Respiratory system: decreased air movement. Cardiovascular system: S1 & S2 heard, RRR. No JVD, murmurs, rubs, gallops or clicks. No pedal edema. Gastrointestinal system: Abdomen is nondistended, soft and nontender. No organomegaly or masses felt. Normal bowel sounds heard. Central nervous system: Alert and oriented. No focal neurological deficits. Extremities: Symmetric 5 x 5 power. Skin: No rashes, lesions or ulcers Psychiatry: Judgement and insight appear normal. Mood & affect appropriate.     Data Reviewed: I have personally reviewed following labs and imaging studies  CBC:  Recent Labs Lab 09/28/16 0327 09/29/16 0325 09/30/16 0322 10/01/16 0313 10/02/16 0349  WBC 9.8 5.6 7.5 6.8 6.1  NEUTROABS 9.1*  --   --   --   --   HGB 12.9 12.7 12.1 13.1 13.8  HCT 40.2 40.8 39.0 41.2 44.6  MCV 84.3 85.7 86.1 85.1 85.8  PLT 250 249 243 263 [id number]   Basic Metabolic Panel:  Recent Labs Lab 09/28/16 0327 09/29/16 0325 09/29/16 0831 09/30/16 0322 10/01/16 0313 10/02/16 0349  NA 137 137  --  137 141 138  K 3.9 5.6* 5.4* 5.0 4.1 4.1  CL 101 101  --  99* 94* 87*  CO2 28 29  --  31 37* 43*  GLUCOSE 119* 103*  --  101* 122* 120*  BUN 12 19  --  20 21* 22*  CREATININE 0.59 0.57  --  0.61 0.66 0.62  CALCIUM 8.6* 9.0  --  8.8* 9.3 9.0  MG  --   --   --  1.9 1.9  --   PHOS  --   --   --   --  4.4  --    GFR: Estimated Creatinine Clearance: 60.8 mL/min (by C-G formula based on SCr of 0.62 mg/dL). Liver Function Tests:  Recent Labs Lab 09/29/16 0325  AST 20  ALT 15  ALKPHOS 91  BILITOT 0.5  PROT 6.7  ALBUMIN 3.8   No results for input(s): LIPASE, AMYLASE in the last 168 hours. No results for input(s): AMMONIA in the last 168 hours. Coagulation Profile: No results for  input(s): INR, PROTIME in the last 168 hours. Cardiac Enzymes: No results for input(s): CKTOTAL, CKMB, CKMBINDEX, TROPONINI in the last 168 hours. BNP (last 3 results) No results for input(s): PROBNP in the last 8760 hours. HbA1C: No results for input(s): HGBA1C in the last 72 hours. CBG:  Recent Labs Lab 09/28/16 1815  GLUCAP 129*   Lipid Profile: No results for input(s): CHOL, HDL, LDLCALC, TRIG, CHOLHDL, LDLDIRECT in the last 72 hours. Thyroid Function Tests: No results for input(s): TSH, T4TOTAL, FREET4, T3FREE, THYROIDAB in the last 72 hours. Anemia Panel: No results for input(s): VITAMINB12, FOLATE, FERRITIN, TIBC, IRON, RETICCTPCT in the last 72 hours. Sepsis Labs: No results for  input(s): PROCALCITON, LATICACIDVEN in the last 168 hours.  Recent Results (from the past 240 hour(s))  MRSA PCR Screening     Status: None   Collection Time: 09/28/16  1:42 PM  Result Value Ref Range Status   MRSA by PCR NEGATIVE NEGATIVE Final    Comment:        The GeneXpert MRSA Assay (FDA approved for NASAL specimens only), is one component of a comprehensive MRSA colonization surveillance program. It is not intended to diagnose MRSA infection nor to guide or monitor treatment for MRSA infections.   Respiratory Panel by PCR     Status: Abnormal   Collection Time: 09/29/16 11:07 AM  Result Value Ref Range Status   Adenovirus NOT DETECTED NOT DETECTED Final   Coronavirus 229E NOT DETECTED NOT DETECTED Final   Coronavirus HKU1 NOT DETECTED NOT DETECTED Final   Coronavirus NL63 NOT DETECTED NOT DETECTED Final   Coronavirus OC43 NOT DETECTED NOT DETECTED Final   Metapneumovirus NOT DETECTED NOT DETECTED Final   Rhinovirus / Enterovirus DETECTED (A) NOT DETECTED Final   Influenza A NOT DETECTED NOT DETECTED Final   Influenza B NOT DETECTED NOT DETECTED Final   Parainfluenza Virus 1 NOT DETECTED NOT DETECTED Final   Parainfluenza Virus 2 NOT DETECTED NOT DETECTED Final    Parainfluenza Virus 3 NOT DETECTED NOT DETECTED Final   Parainfluenza Virus 4 NOT DETECTED NOT DETECTED Final   Respiratory Syncytial Virus NOT DETECTED NOT DETECTED Final   Bordetella pertussis NOT DETECTED NOT DETECTED Final   Chlamydophila pneumoniae NOT DETECTED NOT DETECTED Final   Mycoplasma pneumoniae NOT DETECTED NOT DETECTED Final    Comment: Performed at [facility]  Culture, expectorated sputum-assessment     Status: None   Collection Time: 10/02/16 10:52 AM  Result Value Ref Range Status   Specimen Description SPUTUM  Final   Special Requests NONE  Final   Sputum evaluation THIS SPECIMEN IS ACCEPTABLE FOR SPUTUM CULTURE  Final   Report Status 10/02/2016 FINAL  Final  Culture, respiratory (NON-Expectorated)     Status: None (Preliminary result)   Collection Time: 10/02/16 10:52 AM  Result Value Ref Range Status   Specimen Description SPUTUM  Final   Special Requests NONE Reflexed from [id number]  Final   Gram Stain   Final    ABUNDANT WBC PRESENT,BOTH PMN AND MONONUCLEAR ABUNDANT GRAM POSITIVE COCCI IN PAIRS FEW GRAM POSITIVE RODS FEW GRAM NEGATIVE RODS MODERATE YEAST RARE GRAM NEGATIVE COCCI IN PAIRS Performed at [facility]    Culture PENDING  Incomplete   Report Status PENDING  Incomplete         Radiology Studies: Ct Angio Chest Pe W Or Wo Contrast  Result Date: 10/01/2016 CLINICAL DATA:  Shortness of Breath EXAM: CT ANGIOGRAPHY CHEST WITH CONTRAST TECHNIQUE: Multidetector CT imaging of the chest was performed using the standard protocol during bolus administration of intravenous contrast. Multiplanar CT image reconstructions and MIPs were obtained to evaluate the vascular anatomy. CONTRAST:  100 mL Isovue 370 IV COMPARISON:  None. FINDINGS: Cardiovascular: Left arm IV contrast administration. Innominate vein and SVC patent. Right atrium and right ventricle nondilated. Satisfactory opacification of pulmonary arteries noted, and there is no evidence  of pulmonary emboli. Patent bilateral pulmonary veins drain into the left atrium. Adequate contrast opacification of the thoracic aorta with no evidence of dissection, aneurysm, or stenosis. There is bovine variant brachiocephalic arch anatomy without proximal stenosis. No significant atheromatous plaque. Mediastinum/Nodes: No mediastinal hematoma. No pericardial effusion. No  hilar or mediastinal adenopathy. Lungs/Pleura: No pneumothorax. No pleural effusion. Moderate hyperinflation with emphysematous changes most evident in the upper lobes. No nodule or infiltrate. Upper Abdomen: No acute abnormality. Musculoskeletal: No chest wall abnormality. No acute or significant osseous findings. Review of the MIP images confirms the above findings. IMPRESSION: 1. Negative for acute PE or thoracic aortic dissection. 2. Hyperinflation and upper lobe parenchymal changes consistent with COPD. Electronically Signed   By: Lucrezia Europe M.D.   On: 10/01/2016 14:28   Dg Chest Port 1 View  Result Date: 10/01/2016 CLINICAL DATA:  Shortness of breath, hypoxia, history asthma, COPD, RIGHT breast cancer EXAM: PORTABLE CHEST 1 VIEW COMPARISON:  Portable exam 0747 hours compared to 09/29/2016 FINDINGS: Normal heart size, mediastinal contours, and pulmonary vascularity. Lungs emphysematous but clear. No pleural effusion or pneumothorax. LEFT breast prosthesis and RIGHT breast tissue expander. Surgical clips RIGHT axilla. Bones demineralized. IMPRESSION: COPD changes without acute infiltrate. Electronically Signed   By: Lavonia Dana M.D.   On: 10/01/2016 08:12        Scheduled Meds: . amLODipine  2.5 mg Oral Daily  . anastrozole  1 mg Oral Daily  . budesonide (PULMICORT) nebulizer solution  0.25 mg Nebulization BID  . divalproex  500 mg Oral Q12H  . doxycycline  100 mg Oral Q12H  . FLUoxetine  20 mg Oral Daily  . fluticasone  2 spray Each Nare Daily  . furosemide  40 mg Intravenous Q12H  . heparin  5,000 Units Subcutaneous  Q8H  . ipratropium-albuterol  3 mL Nebulization Q4H  . levothyroxine  75 mcg Oral QAC breakfast  . loratadine  10 mg Oral Daily  . methylPREDNISolone sodium succinate  125 mg Intravenous Q8H  . nicotine  14 mg Transdermal Daily  . paliperidone  9 mg Oral QHS  . pantoprazole  40 mg Oral Daily   Continuous Infusions: . sodium chloride 10 mL/hr at 10/02/16 2300  . albuterol Stopped (09/28/16 0459)     LOS: 5 days    Time spent: 30 minutes    Loretha Stapler, MD [facility] Pager [phone]  If 7PM-7AM, please contact night-coverage [email or web address] Password [email or web address] 10/03/2016, 8:07 AM

## 2016-10-03 NOTE — Progress Notes (Signed)
RN notified RT that RN will be removing PT from BiPAP and placing on VM.

## 2016-10-03 NOTE — Progress Notes (Signed)
Name: Courtney Terry MRN: [id number] DOB: May 10, 1961    ADMISSION DATE:  09/28/2016 CONSULTATION DATE:  09/28/2016  REFERRING MD :  Nile Riggs [name]  CHIEF COMPLAINT:  Acute on chronic respiratory failure  BRIEF PATIENT DESCRIPTION: 55 year old female with history of O2 dep COPD (FEV 23% predicted in 2014) and non-compliance with medical therapy who is still actively smoking presenting to the hospital with worsening SOB for a week.  Reports wheezing, cough and increased sputum production with subjective fever and chills.  Patient is on BiPAP for worsening hypercarbia and unable to provide further history.  SIGNIFICANT EVENTS  12/23 admission to hospital for COPD exacerbation  STUDIES:  CXR 12/23 without acute disease RVP 12/24: Positive for rhinovirus CTA 12/2: Emphysematous changes, No acute PE or infiltrate  HISTORY OF PRESENT ILLNESS:  55 year old female with history of O2 dep COPD and non-compliance with medical therapy who is still actively smoking presenting to the hospital with worsening SOB for a week.  Reports wheezing, cough and increased sputum production with subjective fever and chills.  Patient was admitted to the ICU and placed on BiPAP for worsening hypercarbia and unable to provide further history.  SUBJECTIVE:  Off BIPAP this morning for breakfast  VITAL SIGNS: Temp:  [96.4 F (35.8 C)-99 F (37.2 C)] 97.3 F (36.3 C) (12/28 0802) Pulse Rate:  [86-109] 92 (12/28 0818) Resp:  [11-22] 21 (12/28 1000) BP: (90-142)/(63-91) 142/82 (12/28 1000) SpO2:  [89 %-99 %] 89 % (12/28 1000) FiO2 (%):  [50 %] 50 % (12/28 0811) Weight:  [47.9 kg (105 lb 9.6 oz)-54.8 kg (120 lb 13 oz)] 47.9 kg (105 lb 9.6 oz) (12/28 0100)  PHYSICAL EXAMINATION: General:  Chronically ill appearing, mild respiratory distress but speaking in full sentences HENT: NCAT OP clear PULM: accessory muscle use, very poor ir movement, faint wheeze CV: RRR, no mgr GI: BS+, soft, nontender MSK: normal  bulk and tone Neuro: awake and alert, no distress  Recent Labs Lab 09/30/16 0322 10/01/16 0313 10/02/16 0349  NA 137 141 138  K 5.0 4.1 4.1  CL 99* 94* 87*  CO2 31 37* 43*  BUN 20 21* 22*  CREATININE 0.61 0.66 0.62  GLUCOSE 101* 122* 120*    Recent Labs Lab 09/30/16 0322 10/01/16 0313 10/02/16 0349  HGB 12.1 13.1 13.8  HCT 39.0 41.2 44.6  WBC 7.5 6.8 6.1  PLT 243 263 256   Ct Angio Chest Pe W Or Wo Contrast  Result Date: 10/01/2016 CLINICAL DATA:  Shortness of Breath EXAM: CT ANGIOGRAPHY CHEST WITH CONTRAST TECHNIQUE: Multidetector CT imaging of the chest was performed using the standard protocol during bolus administration of intravenous contrast. Multiplanar CT image reconstructions and MIPs were obtained to evaluate the vascular anatomy. CONTRAST:  100 mL Isovue 370 IV COMPARISON:  None. FINDINGS: Cardiovascular: Left arm IV contrast administration. Innominate vein and SVC patent. Right atrium and right ventricle nondilated. Satisfactory opacification of pulmonary arteries noted, and there is no evidence of pulmonary emboli. Patent bilateral pulmonary veins drain into the left atrium. Adequate contrast opacification of the thoracic aorta with no evidence of dissection, aneurysm, or stenosis. There is bovine variant brachiocephalic arch anatomy without proximal stenosis. No significant atheromatous plaque. Mediastinum/Nodes: No mediastinal hematoma. No pericardial effusion. No hilar or mediastinal adenopathy. Lungs/Pleura: No pneumothorax. No pleural effusion. Moderate hyperinflation with emphysematous changes most evident in the upper lobes. No nodule or infiltrate. Upper Abdomen: No acute abnormality. Musculoskeletal: No chest wall abnormality. No acute or  significant osseous findings. Review of the MIP images confirms the above findings. IMPRESSION: 1. Negative for acute PE or thoracic aortic dissection. 2. Hyperinflation and upper lobe parenchymal changes consistent with COPD.  Electronically Signed   By: Lucrezia Europe M.D.   On: 10/01/2016 14:28  Images personally reviewed.  ASSESSMENT / PLAN: 55 year old female with O2 dependent COPD, non-compliant with therapy and actively smoking presenting with COPD exacerbation in setting of rhinovirus infection, resulting in acute on chronic respiratory failure requiring mechanical support.  VCD has also been an issue in the past but does not seem to be now.  CTA does not show any acute abnormality. Anticipate slow recovery.  Issues: Acute on chronic respiratory failure  COPD exacerbation > slowly improving Rhinovirus infection Hyperkalemia- resolved Tobacco abuse  - Hold lasix - Maintain in the ICU given dependence on BiPAP for now. - give breaks from BIPAP> try 2 hours at a time, breaks for meals - BMET in AM - Doxy to continue - Solumedrol> decrease to 80mg  IV q8h today - PRN Albuterol - Duonebs, pulmicort - Smoking cessation encouraged  I addressed code status with her today.  She states that she wishes to be placed on life support if needed.  Of note she required intubation for respiratory failure in November 2017.  Will continue close monitoring in ICU.  May need palliative medicine support at some point to discuss goals of care further because her prognosis from severe COPD with recurrent respiratory failure is poor.  I explained to her that she would not do well with invasive mechanical ventilation.  Critical care time - 33 mins.  Roselie Awkward, MD [place] [facility] Pager: [phone] Cell: [phone] After 3pm or if no response, call [phone]

## 2016-10-03 NOTE — Progress Notes (Signed)
Dr Lake Bells states to leave foley in today.

## 2016-10-04 DIAGNOSIS — F17210 Nicotine dependence, cigarettes, uncomplicated: Secondary | ICD-10-CM

## 2016-10-04 LAB — CULTURE, RESPIRATORY W GRAM STAIN: Culture: NORMAL

## 2016-10-04 LAB — BASIC METABOLIC PANEL
Anion gap: 9 (ref 5–15)
BUN: 39 mg/dL — ABNORMAL HIGH (ref 6–20)
CO2: 39 mmol/L — ABNORMAL HIGH (ref 22–32)
Calcium: 9.2 mg/dL (ref 8.9–10.3)
Chloride: 88 mmol/L — ABNORMAL LOW (ref 101–111)
Creatinine, Ser: 0.59 mg/dL (ref 0.44–1.00)
GFR calc Af Amer: >60 mL/min (ref >60)
GFR calc non Af Amer: >60 mL/min (ref >60)
Glucose, Bld: 179 mg/dL — ABNORMAL HIGH (ref 65–99)
Potassium: 3.9 mmol/L (ref 3.5–5.1)
Sodium: 136 mmol/L (ref 135–145)

## 2016-10-04 MED ORDER — ENSURE ENLIVE PO LIQD
237.0000 mL | Freq: Three times a day (TID) | ORAL | Status: DC
  Administered 2016-10-04 – 2016-10-08 (×10): 237 mL via ORAL
  Filled 2016-10-04: qty 237

## 2016-10-04 MED ORDER — SODIUM CHLORIDE 0.9 % IV BOLUS (SEPSIS)
500.0000 mL | Freq: Once | INTRAVENOUS | Status: AC
  Administered 2016-10-04: 500 mL via INTRAVENOUS

## 2016-10-04 MED ORDER — ADULT MULTIVITAMIN W/MINERALS CH
1.0000 | ORAL_TABLET | Freq: Every day | ORAL | Status: DC
  Administered 2016-10-04 – 2016-10-09 (×6): 1 via ORAL
  Filled 2016-10-04 (×6): qty 1

## 2016-10-04 MED ORDER — GUAIFENESIN ER 600 MG PO TB12
1200.0000 mg | ORAL_TABLET | Freq: Two times a day (BID) | ORAL | Status: DC
  Administered 2016-10-04 – 2016-10-09 (×11): 1200 mg via ORAL
  Filled 2016-10-04 (×11): qty 2

## 2016-10-04 NOTE — Progress Notes (Signed)
PROGRESS NOTE    Courtney Terry  [id number] DOB: Jul 24, 1961 DOA: 09/28/2016 PCP: Pcp Not In System    Brief Narrative:  Patient is a 55 year old female history of chronic respiratory failure on 2 L nasal cannula oxygen with ongoing tobacco abuse, bipolar disorder, history of breast cancer, history of polysubstance abuse presenting to the ED with subjective fevers, chills, wheezing, productive cough and worsening respiratory status. Patient admitted for COPD exacerbation and placed on the BiPAP. Pulmonary and critical care consulted.    Assessment & Plan:   Principal Problem:   Acute on chronic respiratory failure with hypoxia (HCC) Active Problems:   Breast cancer of upper-outer quadrant of right female breast (HCC)   COPD GOLD IV with reversibility/ still smoking   Cigarette smoker   COPD exacerbation (HCC)   Protein-calorie malnutrition, severe   Schizophrenia (HCC)   Cocaine abuse   Hyperkalemia   Hypothyroidism   Bipolar I disorder (HCC)   Respiratory acidosis   Acute encephalopathy   Rhinovirus infection   Hypoxia   Acute on chronic respiratory failure with hypoxia and hypercarbia secondary to acute COPD exacerbation - secondary to acute COPD exacerbation - Chest x-ray on admission negative for any acute infiltrate - continues to be on BiPap - Respiratory virus positive for rhinovirus - Influenza PCR negative - Continuing Doxycycline, Solumedrol, scheduled nebulizers - PCCM following - Patient on BiPap on and off yesterday - Patient reports feeling very anxious without BiPap - Appreciate PCCM input and recommendations - CTA 10/01/16 negative for PE - repeat CXR 12/26 showing COPD changes without acute infiltrates - agree with PCCM note concerning patient's severe COPD and possibility of consulting palliative care to discuss goals of care - Sputum culture pending - Palliative care consulted - on 15L Nonrebreather this am   Acute COPD exacerbation -  Chronic respiratory failure on home O2 with ongoing tobacco abuse - Presented to the ED with worsening shortness of breath wheezing, productive cough - Chest x-ray negative for any acute infiltrate - Rhinovirus Positive, Influenza Negative - Continue IV doxycycline, IV Solu-Medrol, scheduled nebulizers - Added Pulmicort, PPI, Claritin, Flonase - Pulmonology consulted; appreciate their recommendations - on 15L nonrebreather this am - severe COPD dx- palliative care consulted  Tobacco abuse -Tobacco cessation - Nicotine patch.  History of breast cancer - Continue Arimidex - Outpatient follow-up.  Hypothyroidism - TSH of 0.583 - Continue home dose Synthroid.  Bipolar disorder - Continue Depakote.  Hyperkalemia - Replete potassium level - K WNL yesterday - repeat BMP pending  Severe protein calorie malnutrition - Consult with dietitian and place on nutritional supplementation once tolerating PO and off BiPap - patient appears very frail and thin  Decreased urinary output - 524ml bolus ordered this am - follow UOP as patient has a foley   DVT prophylaxis: SCDs. Code Status: Full Family Communication: no family bedside Disposition Plan: Remain in the step down unit.  Consultants:   PCCM  Palliative Care  Procedures:   None  Antimicrobials:   Doxycycline IV 12/23>   Subjective: Patient sitting up breathing heavily with nonrebreather mask on.  She says yesterday she was off BiPap for a period of time and felt very anxious without her mask on.  Says she understands severity of her lung disease and is open to talking to palliative care concerning goals of care as well as disease progression especially if she were to continue to smoke.  Objective: Vitals:   10/04/16 0600 10/04/16 0645 10/04/16 0800 10/04/16 0817  BP:  [name] Kitchen)  128/92 (!) 142/100   Pulse:      Resp: 13 12 (!) 22   Temp:      TempSrc:      SpO2: 97% 94% (!) 84% (!) 89%  Weight:        Height:        Intake/Output Summary (Last 24 hours) at 10/04/16 0925 Last data filed at 10/04/16 0800  Gross per 24 hour  Intake              770 ml  Output              960 ml  Net             -190 ml   Filed Weights   10/02/16 1049 10/03/16 0100 10/04/16 0100  Weight: 54.8 kg (120 lb 13 oz) 47.9 kg (105 lb 9.6 oz) 44.8 kg (98 lb 12.3 oz)    Examination:  General exam: sleeping in bed with BiPap on, no acute distress noted Respiratory system: decreased air movement, wheezing noted on inspiration and expiration bilaterally Cardiovascular system: S1 & S2 heard, RRR. No JVD, murmurs, rubs, gallops or clicks. No pedal edema. Gastrointestinal system: Abdomen is nondistended, soft and nontender. No organomegaly or masses felt. Normal bowel sounds heard. Central nervous system: Alert and oriented. No focal neurological deficits. Extremities: Symmetric 5 x 5 power. Skin: No rashes, lesions or ulcers Psychiatry: Judgement and insight appear normal. Mood & affect appropriate.     Data Reviewed: I have personally reviewed following labs and imaging studies  CBC:  Recent Labs Lab 09/28/16 0327 09/29/16 0325 09/30/16 0322 10/01/16 0313 10/02/16 0349  WBC 9.8 5.6 7.5 6.8 6.1  NEUTROABS 9.1*  --   --   --   --   HGB 12.9 12.7 12.1 13.1 13.8  HCT 40.2 40.8 39.0 41.2 44.6  MCV 84.3 85.7 86.1 85.1 85.8  PLT 250 249 243 [id number]   Basic Metabolic Panel:  Recent Labs Lab 09/28/16 0327 09/29/16 0325 09/29/16 0831 09/30/16 0322 10/01/16 0313 10/02/16 0349  NA 137 137  --  137 141 138  K 3.9 5.6* 5.4* 5.0 4.1 4.1  CL 101 101  --  99* 94* 87*  CO2 28 29  --  31 37* 43*  GLUCOSE 119* 103*  --  101* 122* 120*  BUN 12 19  --  20 21* 22*  CREATININE 0.59 0.57  --  0.61 0.66 0.62  CALCIUM 8.6* 9.0  --  8.8* 9.3 9.0  MG  --   --   --  1.9 1.9  --   PHOS  --   --   --   --  4.4  --    GFR: Estimated Creatinine Clearance: 56.9 mL/min (by C-G formula based on SCr of 0.62  mg/dL). Liver Function Tests:  Recent Labs Lab 09/29/16 0325  AST 20  ALT 15  ALKPHOS 91  BILITOT 0.5  PROT 6.7  ALBUMIN 3.8   No results for input(s): LIPASE, AMYLASE in the last 168 hours. No results for input(s): AMMONIA in the last 168 hours. Coagulation Profile: No results for input(s): INR, PROTIME in the last 168 hours. Cardiac Enzymes: No results for input(s): CKTOTAL, CKMB, CKMBINDEX, TROPONINI in the last 168 hours. BNP (last 3 results) No results for input(s): PROBNP in the last 8760 hours. HbA1C: No results for input(s): HGBA1C in the last 72 hours. CBG:  Recent Labs Lab 09/28/16 1815  GLUCAP 129*   Lipid Profile: No  results for input(s): CHOL, HDL, LDLCALC, TRIG, CHOLHDL, LDLDIRECT in the last 72 hours. Thyroid Function Tests: No results for input(s): TSH, T4TOTAL, FREET4, T3FREE, THYROIDAB in the last 72 hours. Anemia Panel: No results for input(s): VITAMINB12, FOLATE, FERRITIN, TIBC, IRON, RETICCTPCT in the last 72 hours. Sepsis Labs: No results for input(s): PROCALCITON, LATICACIDVEN in the last 168 hours.  Recent Results (from the past 240 hour(s))  MRSA PCR Screening     Status: None   Collection Time: 09/28/16  1:42 PM  Result Value Ref Range Status   MRSA by PCR NEGATIVE NEGATIVE Final    Comment:        The GeneXpert MRSA Assay (FDA approved for NASAL specimens only), is one component of a comprehensive MRSA colonization surveillance program. It is not intended to diagnose MRSA infection nor to guide or monitor treatment for MRSA infections.   Respiratory Panel by PCR     Status: Abnormal   Collection Time: 09/29/16 11:07 AM  Result Value Ref Range Status   Adenovirus NOT DETECTED NOT DETECTED Final   Coronavirus 229E NOT DETECTED NOT DETECTED Final   Coronavirus HKU1 NOT DETECTED NOT DETECTED Final   Coronavirus NL63 NOT DETECTED NOT DETECTED Final   Coronavirus OC43 NOT DETECTED NOT DETECTED Final   Metapneumovirus NOT DETECTED NOT  DETECTED Final   Rhinovirus / Enterovirus DETECTED (A) NOT DETECTED Final   Influenza A NOT DETECTED NOT DETECTED Final   Influenza B NOT DETECTED NOT DETECTED Final   Parainfluenza Virus 1 NOT DETECTED NOT DETECTED Final   Parainfluenza Virus 2 NOT DETECTED NOT DETECTED Final   Parainfluenza Virus 3 NOT DETECTED NOT DETECTED Final   Parainfluenza Virus 4 NOT DETECTED NOT DETECTED Final   Respiratory Syncytial Virus NOT DETECTED NOT DETECTED Final   Bordetella pertussis NOT DETECTED NOT DETECTED Final   Chlamydophila pneumoniae NOT DETECTED NOT DETECTED Final   Mycoplasma pneumoniae NOT DETECTED NOT DETECTED Final    Comment: Performed at [facility]  Culture, expectorated sputum-assessment     Status: None   Collection Time: 10/02/16 10:52 AM  Result Value Ref Range Status   Specimen Description SPUTUM  Final   Special Requests NONE  Final   Sputum evaluation THIS SPECIMEN IS ACCEPTABLE FOR SPUTUM CULTURE  Final   Report Status 10/02/2016 FINAL  Final  Culture, respiratory (NON-Expectorated)     Status: None (Preliminary result)   Collection Time: 10/02/16 10:52 AM  Result Value Ref Range Status   Specimen Description SPUTUM  Final   Special Requests NONE Reflexed from [id number]  Final   Gram Stain   Final    ABUNDANT WBC PRESENT,BOTH PMN AND MONONUCLEAR ABUNDANT GRAM POSITIVE COCCI IN PAIRS FEW GRAM POSITIVE RODS FEW GRAM NEGATIVE RODS MODERATE YEAST RARE GRAM NEGATIVE COCCI IN PAIRS    Culture   Final    CULTURE REINCUBATED FOR BETTER GROWTH Performed at [facility]    Report Status PENDING  Incomplete         Radiology Studies: No results found.      Scheduled Meds: . amLODipine  2.5 mg Oral Daily  . anastrozole  1 mg Oral Daily  . budesonide (PULMICORT) nebulizer solution  0.25 mg Nebulization BID  . divalproex  500 mg Oral Q12H  . doxycycline  100 mg Oral Q12H  . FLUoxetine  20 mg Oral Daily  . fluticasone  2 spray Each Nare Daily  .  heparin  5,000 Units Subcutaneous Q8H  . ipratropium-albuterol  3  mL Nebulization Q4H  . levothyroxine  75 mcg Oral QAC breakfast  . loratadine  10 mg Oral Daily  . methylPREDNISolone sodium succinate  80 mg Intravenous Q8H  . nicotine  14 mg Transdermal Daily  . paliperidone  9 mg Oral QHS  . pantoprazole  40 mg Oral Daily   Continuous Infusions: . sodium chloride Stopped (10/03/16 1900)  . albuterol Stopped (09/28/16 0459)     LOS: 6 days    Time spent: 30 minutes    Loretha Stapler, MD [facility] Pager [phone]  If 7PM-7AM, please contact night-coverage [email or web address] Password [email or web address] 10/04/2016, 9:25 AM

## 2016-10-04 NOTE — Progress Notes (Signed)
Name: Courtney Terry MRN: [id number] DOB: Apr 08, 1961    ADMISSION DATE:  09/28/2016 CONSULTATION DATE:  09/28/2016  REFERRING MD :  Nile Riggs [name]  CHIEF COMPLAINT:  Acute on chronic respiratory failure  BRIEF PATIENT DESCRIPTION: 55 year old female with history of O2 dep COPD (FEV 23% predicted in 2014) and non-compliance with medical therapy who is still actively smoking presenting to the hospital with worsening SOB for a week.  Reports wheezing, cough and increased sputum production with subjective fever and chills.  Patient is on BiPAP for worsening hypercarbia and unable to provide further history.  SIGNIFICANT EVENTS  12/23 admission to hospital for COPD exacerbation  STUDIES:  CXR 12/23 without acute disease RVP 12/24: Positive for rhinovirus CTA 12/2: Emphysematous changes, No acute PE or infiltrate  HISTORY OF PRESENT ILLNESS:  55 year old female with history of O2 dep COPD and non-compliance with medical therapy who is still actively smoking presenting to the hospital with worsening SOB for a week.  Reports wheezing, cough and increased sputum production with subjective fever and chills.  Patient was admitted to the ICU and placed on BiPAP for worsening hypercarbia and unable to provide further history.  SUBJECTIVE:  Better today Off BIPAP again this morning but still requiring it much of the day   VITAL SIGNS: Temp:  [97.7 F (36.5 C)-98.4 F (36.9 C)] 98.4 F (36.9 C) (12/29 0000) Pulse Rate:  [90-117] 100 (12/28 2331) Resp:  [11-22] 22 (12/29 0800) BP: (102-147)/(69-100) 142/100 (12/29 0800) SpO2:  [84 %-97 %] 89 % (12/29 0817) FiO2 (%):  [50 %-60 %] 55 % (12/29 0800) Weight:  [98 lb 12.3 oz (44.8 kg)] 98 lb 12.3 oz (44.8 kg) (12/29 0100)  PHYSICAL EXAMINATION: General:  Chronically ill appearing, accessory muscle use but speaking in full sentences HENT: NCAT OP clear PULM: accessory muscle use, improved air movement, increased wheeze CV: RRR, no mgr GI:  BS+, soft, nontender MSK: normal bulk and tone Neuro: awake and alert, no distress  Recent Labs Lab 09/30/16 0322 10/01/16 0313 10/02/16 0349  NA 137 141 138  K 5.0 4.1 4.1  CL 99* 94* 87*  CO2 31 37* 43*  BUN 20 21* 22*  CREATININE 0.61 0.66 0.62  GLUCOSE 101* 122* 120*    Recent Labs Lab 09/30/16 0322 10/01/16 0313 10/02/16 0349  HGB 12.1 13.1 13.8  HCT 39.0 41.2 44.6  WBC 7.5 6.8 6.1  PLT 243 263 256   No results found.Images personally reviewed.  ASSESSMENT / PLAN: 55 year old female with O2 dependent COPD, non-compliant with therapy and actively smoking presenting with COPD exacerbation in setting of rhinovirus infection, resulting in acute on chronic respiratory failure requiring mechanical support.  VCD has also been an issue in the past but does not seem to be now.  CTA does not show any acute abnormality. Anticipate slow recovery.  Issues: Acute on chronic respiratory failure  COPD exacerbation > slowly improving; increasing chest congestion 12/29 Rhinovirus infection Hyperkalemia- resolved Tobacco abuse  - Continue to hold lasix today - Maintain in the ICU given dependence on BiPAP - give breaks from BIPAP> try 2-4 hours at a time, breaks for meals - BMET in AM - Doxy to continue - Solumedrol> continue 80mg  IV q8h today, will decrease 12/30 - PRN Albuterol - Duonebs, pulmicort - Smoking cessation encouraged - add guaifenesin and chest PT 12/30 - out of bed 12/29  I addressed code status with her 12/28, see discussion in my note.  Given the severity of her underlying COPD and her recurrent severe exacerbations of COPD, she needs a more thorough goals of care discussion.  Will ask palliative medicine to assist.    Roselie Awkward, MD [place] [facility] Pager: [phone] Cell: [phone] After 3pm or if no response, call [phone]

## 2016-10-04 NOTE — Progress Notes (Signed)
Initial Nutrition Assessment  DOCUMENTATION CODES:   Severe malnutrition in context of chronic illness, Underweight  INTERVENTION:  Monitor magnesium, potassium, and phosphorus daily for at least 3 days, MD to replete as needed, as pt is at risk for refeeding syndrome given severe malnutrition.  Recommend liberalizing diet from Heart Healthy/CHO Modified to Regular in setting of severe malnutrition. Did not note any medical history that would require Healthy Healthy or CHO Modified diet and CBGs will be elevated in setting of methylprednisolone.  Provide Ensure Enlive po TID with meals, each supplement provides 350 kcal and 20 grams of protein.  Provide multivitamin with minerals daily.  Will monitor for discussions regarding goals of care. If patient to remain full code/full scope treatment and sustains inadequate intake in setting of increased needs, will need to consider nutrition support (tube feeding).  NUTRITION DIAGNOSIS:   Increased nutrient needs related to catabolic illness, cancer and cancer related treatments (COPD Exacerbation, Breast Cancer) as evidenced by estimated needs.  GOAL:   Patient will meet greater than or equal to 90% of their needs  MONITOR:   PO intake, Supplement acceptance, Labs, Weight trends, I & O's  REASON FOR ASSESSMENT:   Consult Assessment of nutrition requirement/status  ASSESSMENT:   55 year old female history of chronic respiratory failure on 2 L nasal cannula oxygen with ongoing tobacco abuse, bipolar disorder, history of breast cancer, history of polysubstance abuse presenting to the ED with subjective fevers, chills, wheezing, productive cough and worsening respiratory status. Patient admitted for COPD exacerbation and placed on the BiPAP.   -Palliative Medicine has been consulted to discuss goals of care with patient in setting of advanced COPD, second ICU stay in 2 months for ARF.  Spoke with patient at bedside. She reports good  appetite now and PTA. She does endorse difficulty eating with increased work of breathing. Patient denies N/V or abdominal pain. Patient reports she has 100% of 2 meals daily at home. She was not able to provide further details on intake due to difficulty breathing.   Patient reporting UBW 137 lbs greater than one year ago, but RD did not find any weights in chart as high as 137 lbs. Weight in chart during this admission and over the past year has fluctuated significantly. Patient has been on Lasix previously this admission. Patient has lost approximately 10 lbs (9% body weight) over 5 months, which is significant for time frame.  Meal Completion: 0-50% of Heart Healthy/CHO Modified diet  Medications reviewed and include: anastrozole 1 mg daily, levothyroxine, methylprednisolone 80 mg Q8hrs, pantoprazole.  Labs reviewed: Chloride 88, CO2 39, BUN 39.  Nutrition-Focused physical exam completed. Findings are severe fat depletion, severe muscle depletion, and no edema.  Patient meets criteria for severe chronic malnutrition in setting of 9% weight loss over 5 months, severe fat depletion, severe muscle depletion.  Discussed with RN. Patient likely not aware of consult to Palliative at this time. Patient's female companion (not in room at time of RD assessment) has been eating off of her trays so meal completion recorded in chart may not be accurate.  Diet Order:  Diet heart healthy/carb modified Room service appropriate? Yes; Fluid consistency: Thin  Skin:  Reviewed, no issues  Last BM:  10/01/2016  Height:   Ht Readings from Last 1 Encounters:  09/28/16 5\' 10"  (1.778 m)    Weight:   Wt Readings from Last 1 Encounters:  10/04/16 98 lb 12.3 oz (44.8 kg)    Ideal Body Weight:  50 kg  BMI:  Body mass index is 14.17 kg/m.  Estimated Nutritional Needs:   Kcal:  1475-1700 (HBE x 1.3-1.5)  Protein:  67-76 grams (1.5-1.7 grams/kg)  Fluid:  >/= 1.3-1.5 L/day (30-35 ml/kg)  EDUCATION  NEEDS:   Education needs no appropriate at this time  Willey Blade, MS, RD, LDN Pager: [phone] After Hours Pager: [phone]

## 2016-10-04 NOTE — Progress Notes (Addendum)
0540. K. Schorr, NP notified regarding pt urine output of 267mL for shift. NP made aware of pt having been diuresed on 12/28 and that pt had no AM lab orders for today. Order received. Will continue to monitor.

## 2016-10-05 DIAGNOSIS — G9340 Encephalopathy, unspecified: Secondary | ICD-10-CM

## 2016-10-05 LAB — BASIC METABOLIC PANEL
Anion gap: 9 (ref 5–15)
BUN: 34 mg/dL — AB (ref 6–20)
CALCIUM: 9.5 mg/dL (ref 8.9–10.3)
CO2: 40 mmol/L — ABNORMAL HIGH (ref 22–32)
Chloride: 90 mmol/L — ABNORMAL LOW (ref 101–111)
Creatinine, Ser: 0.46 mg/dL (ref 0.44–1.00)
GFR calc Af Amer: >60 mL/min (ref >60)
GLUCOSE: 119 mg/dL — AB (ref 65–99)
Potassium: 4.3 mmol/L (ref 3.5–5.1)
Sodium: 139 mmol/L (ref 135–145)

## 2016-10-05 LAB — PHOSPHORUS: PHOSPHORUS: 3.7 mg/dL (ref 2.5–4.6)

## 2016-10-05 LAB — MAGNESIUM: MAGNESIUM: 2.3 mg/dL (ref 1.7–2.4)

## 2016-10-05 MED ORDER — HYDRALAZINE HCL 20 MG/ML IJ SOLN: 10.0000 mg | Freq: Four times a day (QID) | INTRAMUSCULAR | Status: DC | PRN

## 2016-10-05 MED ORDER — METHYLPREDNISOLONE SODIUM SUCC 125 MG IJ SOLR
80.0000 mg | Freq: Two times a day (BID) | INTRAMUSCULAR | Status: DC
  Administered 2016-10-05: 80 mg via INTRAVENOUS
  Filled 2016-10-05: qty 2

## 2016-10-05 MED ORDER — AMLODIPINE BESYLATE 5 MG PO TABS
5.0000 mg | ORAL_TABLET | Freq: Every day | ORAL | Status: DC
  Administered 2016-10-06 – 2016-10-09 (×4): 5 mg via ORAL
  Filled 2016-10-05 (×4): qty 1

## 2016-10-05 NOTE — Progress Notes (Signed)
Pt refused cpt at this time.  

## 2016-10-05 NOTE — Progress Notes (Signed)
Name: Courtney Terry MRN: [id number] DOB: [birth year]    ADMISSION DATE:  09/28/2016 CONSULTATION DATE:  09/28/2016  REFERRING MD :  Nile Riggs [name]  CHIEF COMPLAINT:  Acute on chronic respiratory failure  BRIEF PATIENT DESCRIPTION: 55 year old female with history of O2 dep COPD (FEV 23% predicted in 2014) and non-compliance with medical therapy who is still actively smoking presenting to the hospital with worsening SOB for a week.  Reports wheezing, cough and increased sputum production with subjective fever and chills.  Patient is on BiPAP for worsening hypercarbia and unable to provide further history.  SIGNIFICANT EVENTS  12/23 admission to hospital for COPD exacerbation  STUDIES:  CXR 12/23 without acute disease RVP 12/24: Positive for rhinovirus CTA 12/2: Emphysematous changes, No acute PE or infiltrate  HISTORY OF PRESENT ILLNESS:  54 year old female with history of O2 dep COPD and non-compliance with medical therapy who is still actively smoking presenting to the hospital with worsening SOB for a week.  Reports wheezing, cough and increased sputum production with subjective fever and chills.  Patient was admitted to the ICU and placed on BiPAP for worsening hypercarbia and unable to provide further history.  SUBJECTIVE:  O2 saturation dropped transiently overnight to 86%, FiO2 increased to 60% Near unresponsive on exam 12/30   VITAL SIGNS: Temp:  [97.9 F (36.6 C)-98.4 F (36.9 C)] 98.2 F (36.8 C) (12/30 0400) Pulse Rate:  [76-120] 76 (12/30 0416) Resp:  [12-24] 12 (12/30 0500) BP: (100-159)/(61-122) 112/70 (12/30 0416) SpO2:  [84 %-96 %] 96 % (12/30 0500) FiO2 (%):  [40 %-60 %] 60 % (12/30 0416)  PHYSICAL EXAMINATION: General:  Chronically ill appearing, no accessory muscle use HENT: NCAT OP clear PULM:improved air movement, decreased wheeze CV: RRR, no mgr GI: BS+, soft, nontender MSK: normal bulk and tone Neuro: very sleepy, aroused after turning down FiO2  and giving sternal rub  Recent Labs Lab 10/02/16 0349 10/04/16 0927 10/05/16 0332  NA 138 136 139  K 4.1 3.9 4.3  CL 87* 88* 90*  CO2 43* 39* 40*  BUN 22* 39* 34*  CREATININE 0.62 0.59 0.46  GLUCOSE 120* 179* 119*    Recent Labs Lab 09/30/16 0322 10/01/16 0313 10/02/16 0349  HGB 12.1 13.1 13.8  HCT 39.0 41.2 44.6  WBC 7.5 6.8 6.1  PLT 243 263 256   No results found.Images personally reviewed.  ASSESSMENT / PLAN: 55 year old female with O2 dependent COPD, non-compliant with therapy and actively smoking presenting with COPD exacerbation in setting of rhinovirus infection, resulting in acute on chronic respiratory failure requiring mechanical support.  Also has history of VCD from anxiety.  Anticipate slow recovery.  On exam 12/30, very sleepy likely due to hypercarbia in setting of high O2 saturation.  Issues: Acute on chronic respiratory failure > slow improvement COPD exacerbation > slowly improving; increasing chest congestion 12/29 Rhinovirus infection Hyperkalemia- resolved Tobacco abuse Acute encephalopathy> new 12/30, due to hypercarbia  - target SaO2 88-92% discussed with respiratory and nursing 12/30 - check ABG if mental status declines again (improved after I turned down FiO2) - monitor mental status closely in ICU - Continue to hold lasix today - Maintain in the ICU given dependence on BiPAP - give breaks from BIPAP> try 2-4 hours at a time, breaks for meals - D/C doxycycline - Solumedrol> decrease 80mg  IV q12h today - PRN Albuterol - Duonebs, pulmicort - Smoking cessation encouraged - continue guaifenesin - d/c chest PT if patient continues to  refuse - out of bed 12/30  Patient's husband updated bedside.  I continued to stress my concern about her overall prognosis.  Palliative consulted 12/29.  Their help with goals of care will be appreciated.  CC time 35 minutes  Roselie Awkward, MD [place] [facility] Pager: [phone] Cell: [phone] After  3pm or if no response, call [phone]
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
## 2016-10-05 NOTE — Progress Notes (Addendum)
Pt was encouraged to do cpt, but she still declined.  Pt stated she has done it enough today, and that it gives her a headache.  Rx discontinued per MD due to pt refusal.

## 2016-10-05 NOTE — Progress Notes (Signed)
PROGRESS NOTE    Courtney Terry  [id number] DOB: [birth year] DOA: 09/28/2016 PCP: Pcp Not In System    Brief Narrative:  Patient is a 55 year old female history of chronic respiratory failure on 2 L nasal cannula oxygen with ongoing tobacco abuse, bipolar disorder, history of breast cancer, history of polysubstance abuse presenting to the ED with subjective fevers, chills, wheezing, productive cough and worsening respiratory status. Patient admitted for COPD exacerbation and placed on the BiPAP. Pulmonary and critical care consulted.    Assessment & Plan:   Principal Problem:   Acute on chronic respiratory failure with hypoxia ([facility]) Active Problems:   Breast cancer of upper-outer quadrant of right female breast ([facility])   COPD GOLD IV with reversibility/ still smoking   Cigarette smoker   COPD exacerbation ([facility])   Protein-calorie malnutrition, severe   Schizophrenia ([facility])   Cocaine abuse   Hyperkalemia   Hypothyroidism   Bipolar I disorder ([facility])   Respiratory acidosis   Acute encephalopathy   Rhinovirus infection   Hypoxia   Acute on chronic respiratory failure with hypoxia and hypercarbia secondary to acute COPD exacerbation - secondary to acute COPD exacerbation, Chest x-ray on admission negative for any acute infiltrate. - continues to be on BiPap,  - Respiratory virus positive for rhinovirus - Influenza PCR negative -  Resume IV solumedrol and bronchodilators as per PCCM recommendations.  - Appreciate PCCM input and recommendations - CTA 10/01/16 negative for PE - wean her off bIPAP as appropriate. Meanwhile palliative care consulted for goals of care.    Acute COPD exacerbation - Chronic respiratory failure on home O2 with ongoing tobacco abuse - Presented to the ED with worsening shortness of breath wheezing, productive cough - Chest x-ray negative for any acute infiltrate - Rhinovirus Positive, Influenza Negative - Added Pulmicort, PPI, Claritin, Flonase.  Continue with steroids and bronchodilators.  - Pulmonology consulted; appreciate their recommendations - alternating between BIPAP and NRB as tolerated.  - severe COPD dx- palliative care consulted  Tobacco abuse -Tobacco cessation - Nicotine patch.  History of breast cancer - Continue Arimidex - Outpatient follow-up.  Hypothyroidism - TSH of 0.583 - Continue home dose Synthroid.  Bipolar disorder - Continue Depakote.  Hyperkalemia - resolved.  - K WNL yesterday   Severe protein calorie malnutrition - Consult with dietitian and place on nutritional supplementation once tolerating PO and off BiPap - patient appears very frail and thin  Decreased urinary output 529ml over the last 24 hours.  Monitor.  Renal parameters within normal limits.     DVT prophylaxis: SCDs. Code Status: Full Family Communication:  family bedside Disposition Plan: Remain in the step down unit.  Consultants:   PCCM  Palliative Care  Procedures:   None  Antimicrobials:   Doxycycline IV 12/23>   Subjective: Encouraged her to get out of bed to chair.  No new complaints.  No chest pain.  Currently on NRB mask.  Objective: Vitals:   10/05/16 1000 10/05/16 1100 10/05/16 1200 10/05/16 1356  BP: 132/75  (!) 193/110 (!) 193/110  Pulse:    (!) 108  Resp: 16 16 (!) 27   Temp:   97.9 F (36.6 C)   TempSrc:   Oral   SpO2: (!) 89% 90% (!) 87%   Weight:      Height:        Intake/Output Summary (Last 24 hours) at 10/05/16 1404 Last data filed at 10/05/16 0800  Gross per 24 hour  Intake  372 ml  Output              375 ml  Net               -3 ml   Filed Weights   10/02/16 1049 10/03/16 0100 10/04/16 0100  Weight: 54.8 kg (120 lb 13 oz) 47.9 kg (105 lb 9.6 oz) 44.8 kg (98 lb 12.3 oz)    Examination:  General exam: alert and awake on NRB mask.  Respiratory system: decreased air movement,  Scattered wheezing bilaterally.  Cardiovascular system: S1 & S2  heard, RRR. No JVD, murmurs, rubs, gallops or clicks. No pedal edema. Gastrointestinal system: Abdomen is nondistended, soft and nontender. No organomegaly or masses felt. Normal bowel sounds heard. Central nervous system: Alert and oriented. No focal neurological deficits. Extremities: no pedal edema.  Skin: No rashes, lesions or ulcers     Data Reviewed: I have personally reviewed following labs and imaging studies  CBC:  Recent Labs Lab 09/29/16 0325 09/30/16 0322 10/01/16 0313 10/02/16 0349  WBC 5.6 7.5 6.8 6.1  HGB 12.7 12.1 13.1 13.8  HCT 40.8 39.0 41.2 44.6  MCV 85.7 86.1 85.1 85.8  PLT 249 243 263 [id number]   Basic Metabolic Panel:  Recent Labs Lab 09/30/16 0322 10/01/16 0313 10/02/16 0349 10/04/16 0927 10/05/16 0332  NA 137 141 138 136 139  K 5.0 4.1 4.1 3.9 4.3  CL 99* 94* 87* 88* 90*  CO2 31 37* 43* 39* 40*  GLUCOSE 101* 122* 120* 179* 119*  BUN 20 21* 22* 39* 34*  CREATININE 0.61 0.66 0.62 0.59 0.46  CALCIUM 8.8* 9.3 9.0 9.2 9.5  MG 1.9 1.9  --   --  2.3  PHOS  --  4.4  --   --  3.7   GFR: Estimated Creatinine Clearance: 56.9 mL/min (by C-G formula based on SCr of 0.46 mg/dL). Liver Function Tests:  Recent Labs Lab 09/29/16 0325  AST 20  ALT 15  ALKPHOS 91  BILITOT 0.5  PROT 6.7  ALBUMIN 3.8   No results for input(s): LIPASE, AMYLASE in the last 168 hours. No results for input(s): AMMONIA in the last 168 hours. Coagulation Profile: No results for input(s): INR, PROTIME in the last 168 hours. Cardiac Enzymes: No results for input(s): CKTOTAL, CKMB, CKMBINDEX, TROPONINI in the last 168 hours. BNP (last 3 results) No results for input(s): PROBNP in the last 8760 hours. HbA1C: No results for input(s): HGBA1C in the last 72 hours. CBG:  Recent Labs Lab 09/28/16 1815  GLUCAP 129*   Lipid Profile: No results for input(s): CHOL, HDL, LDLCALC, TRIG, CHOLHDL, LDLDIRECT in the last 72 hours. Thyroid Function Tests: No results for input(s):  TSH, T4TOTAL, FREET4, T3FREE, THYROIDAB in the last 72 hours. Anemia Panel: No results for input(s): VITAMINB12, FOLATE, FERRITIN, TIBC, IRON, RETICCTPCT in the last 72 hours. Sepsis Labs: No results for input(s): PROCALCITON, LATICACIDVEN in the last 168 hours.  Recent Results (from the past 240 hour(s))  MRSA PCR Screening     Status: None   Collection Time: 09/28/16  1:42 PM  Result Value Ref Range Status   MRSA by PCR NEGATIVE NEGATIVE Final    Comment:        The GeneXpert MRSA Assay (FDA approved for NASAL specimens only), is one component of a comprehensive MRSA colonization surveillance program. It is not intended to diagnose MRSA infection nor to guide or monitor treatment for MRSA infections.   Respiratory Panel by PCR  Status: Abnormal   Collection Time: 09/29/16 11:07 AM  Result Value Ref Range Status   Adenovirus NOT DETECTED NOT DETECTED Final   Coronavirus 229E NOT DETECTED NOT DETECTED Final   Coronavirus HKU1 NOT DETECTED NOT DETECTED Final   Coronavirus NL63 NOT DETECTED NOT DETECTED Final   Coronavirus OC43 NOT DETECTED NOT DETECTED Final   Metapneumovirus NOT DETECTED NOT DETECTED Final   Rhinovirus / Enterovirus DETECTED (A) NOT DETECTED Final   Influenza A NOT DETECTED NOT DETECTED Final   Influenza B NOT DETECTED NOT DETECTED Final   Parainfluenza Virus 1 NOT DETECTED NOT DETECTED Final   Parainfluenza Virus 2 NOT DETECTED NOT DETECTED Final   Parainfluenza Virus 3 NOT DETECTED NOT DETECTED Final   Parainfluenza Virus 4 NOT DETECTED NOT DETECTED Final   Respiratory Syncytial Virus NOT DETECTED NOT DETECTED Final   Bordetella pertussis NOT DETECTED NOT DETECTED Final   Chlamydophila pneumoniae NOT DETECTED NOT DETECTED Final   Mycoplasma pneumoniae NOT DETECTED NOT DETECTED Final    Comment: Performed at [facility]  Culture, expectorated sputum-assessment     Status: None   Collection Time: 10/02/16 10:52 AM  Result Value Ref Range Status    Specimen Description SPUTUM  Final   Special Requests NONE  Final   Sputum evaluation THIS SPECIMEN IS ACCEPTABLE FOR SPUTUM CULTURE  Final   Report Status 10/02/2016 FINAL  Final  Culture, respiratory (NON-Expectorated)     Status: None   Collection Time: 10/02/16 10:52 AM  Result Value Ref Range Status   Specimen Description SPUTUM  Final   Special Requests NONE Reflexed from [id number]  Final   Gram Stain   Final    ABUNDANT WBC PRESENT,BOTH PMN AND MONONUCLEAR ABUNDANT GRAM POSITIVE COCCI IN PAIRS FEW GRAM POSITIVE RODS FEW GRAM NEGATIVE RODS MODERATE YEAST RARE GRAM NEGATIVE COCCI IN PAIRS    Culture   Final    Consistent with normal respiratory flora. Performed at [facility]    Report Status 10/04/2016 FINAL  Final         Radiology Studies: No results found.      Scheduled Meds: . amLODipine  2.5 mg Oral Daily  . anastrozole  1 mg Oral Daily  . budesonide (PULMICORT) nebulizer solution  0.25 mg Nebulization BID  . divalproex  500 mg Oral Q12H  . feeding supplement (ENSURE ENLIVE)  237 mL Oral TID WC  . FLUoxetine  20 mg Oral Daily  . fluticasone  2 spray Each Nare Daily  . guaiFENesin  1,200 mg Oral BID  . heparin  5,000 Units Subcutaneous Q8H  . ipratropium-albuterol  3 mL Nebulization Q4H  . levothyroxine  75 mcg Oral QAC breakfast  . loratadine  10 mg Oral Daily  . methylPREDNISolone sodium succinate  80 mg Intravenous Q12H  . multivitamin with minerals  1 tablet Oral Daily  . nicotine  14 mg Transdermal Daily  . paliperidone  9 mg Oral QHS  . pantoprazole  40 mg Oral Daily   Continuous Infusions: . sodium chloride Stopped (10/03/16 1900)  . albuterol Stopped (09/28/16 0459)     LOS: 7 days    Time spent: 8 minutes    Nickalos Petersen, MD [facility] Pager [phone]  If 7PM-7AM, please contact night-coverage [email or web address] Password [email or web address] 10/05/2016, 2:04 PM

## 2016-10-05 NOTE — Progress Notes (Addendum)
Pt was given her scheduled nebulizer treatment which she tolerated well.  Upon arrival to bedside, pt was found watching tv, no increased wob/respiratory distress noted on 50% venturi mask, spo2 93%.  After neb tx given, pt requested to go back on bipap.  Pt was advised of the indications for bipap, and she stated that she sleeps better with it on.  This RT placed pt on bipap per her request, at 30% fio2, spo2 91%.  RN aware.

## 2016-10-06 DIAGNOSIS — E43 Unspecified severe protein-calorie malnutrition: Secondary | ICD-10-CM

## 2016-10-06 DIAGNOSIS — E039 Hypothyroidism, unspecified: Secondary | ICD-10-CM

## 2016-10-06 DIAGNOSIS — F319 Bipolar disorder, unspecified: Secondary | ICD-10-CM

## 2016-10-06 LAB — BASIC METABOLIC PANEL
Anion gap: 10 (ref 5–15)
BUN: 33 mg/dL — ABNORMAL HIGH (ref 6–20)
CALCIUM: 9.3 mg/dL (ref 8.9–10.3)
CHLORIDE: 91 mmol/L — AB (ref 101–111)
CO2: 38 mmol/L — AB (ref 22–32)
CREATININE: 0.44 mg/dL (ref 0.44–1.00)
GFR calc non Af Amer: >60 mL/min (ref >60)
GLUCOSE: 131 mg/dL — AB (ref 65–99)
Potassium: 4.6 mmol/L (ref 3.5–5.1)
Sodium: 139 mmol/L (ref 135–145)

## 2016-10-06 LAB — MAGNESIUM: MAGNESIUM: 2.1 mg/dL (ref 1.7–2.4)

## 2016-10-06 LAB — PHOSPHORUS: Phosphorus: 3.5 mg/dL (ref 2.5–4.6)

## 2016-10-06 MED ORDER — METHYLPREDNISOLONE SODIUM SUCC 40 MG IJ SOLR
40.0000 mg | Freq: Two times a day (BID) | INTRAMUSCULAR | Status: DC
  Administered 2016-10-06 – 2016-10-08 (×5): 40 mg via INTRAVENOUS
  Filled 2016-10-06 (×5): qty 1

## 2016-10-06 MED ORDER — LORAZEPAM 1 MG PO TABS
1.0000 mg | ORAL_TABLET | Freq: Once | ORAL | Status: AC
  Administered 2016-10-06: 1 mg via ORAL
  Filled 2016-10-06: qty 1

## 2016-10-06 NOTE — Progress Notes (Signed)
Pt requested BiPap @ noon, wore for approx 2 hours. Pt prefers Venti Mask (.50) over Nasal Canula.

## 2016-10-06 NOTE — Progress Notes (Signed)
   Name: Courtney Terry MRN: [id number] DOB: July 26, 1961    ADMISSION DATE:  09/28/2016 CONSULTATION DATE:  09/28/2016  REFERRING MD :  Nile Riggs [name]  CHIEF COMPLAINT:  Acute on chronic respiratory failure  BRIEF PATIENT DESCRIPTION: 55 year old female with history of O2 dep COPD (FEV 23% predicted in 2014) and non-compliance with medical therapy who is still actively smoking presenting to the hospital with worsening SOB for a week.  Reports wheezing, cough and increased sputum production with subjective fever and chills.  Patient is on BiPAP for worsening hypercarbia and unable to provide further history.  SIGNIFICANT EVENTS  12/23 admission to hospital for COPD exacerbation  STUDIES:  CXR 12/23 without acute disease RVP 12/24: Positive for rhinovirus CTA 12/2: Emphysematous changes, No acute PE or infiltrate  HISTORY OF PRESENT ILLNESS:  55 year old female with history of O2 dep COPD and non-compliance with medical therapy who is still actively smoking presenting to the hospital with worsening SOB for a week.  Reports wheezing, cough and increased sputum production with subjective fever and chills.  Patient was admitted to the ICU and placed on BiPAP for worsening hypercarbia and unable to provide further history.  SUBJECTIVE:  More awake and alert Palliative conversation yesterday: DNR but full medical support apart from vent Off BIPAP several hours   VITAL SIGNS: Temp:  [97.9 F (36.6 C)-99.1 F (37.3 C)] 97.9 F (36.6 C) (12/31 0400) Pulse Rate:  [92-110] 110 (12/30 2113) Resp:  [11-27] 16 (12/31 0600) BP: (94-193)/(58-110) 116/91 (12/31 0600) SpO2:  [87 %-97 %] 93 % (12/31 0600) FiO2 (%):  [30 %-50 %] 50 % (12/31 0754) Weight:  [117 lb 11.6 oz (53.4 kg)] 117 lb 11.6 oz (53.4 kg) (12/31 0500)  PHYSICAL EXAMINATION: General:  Chronically ill appearing, no accessory muscle use HENT: NCAT OP clear PULM:improved air movement, decreased wheeze CV: RRR, no mgr GI: BS+,  soft, nontender MSK: normal bulk and tone Neuro: awake, alert, no distress  Recent Labs Lab 10/04/16 0927 10/05/16 0332 10/06/16 0430  NA 136 139 139  K 3.9 4.3 4.6  CL 88* 90* 91*  CO2 39* 40* 38*  BUN 39* 34* 33*  CREATININE 0.59 0.46 0.44  GLUCOSE 179* 119* 131*    Recent Labs Lab 09/30/16 0322 10/01/16 0313 10/02/16 0349  HGB 12.1 13.1 13.8  HCT 39.0 41.2 44.6  WBC 7.5 6.8 6.1  PLT 243 263 256   No results found.Images personally reviewed.  ASSESSMENT / PLAN: 55 year old female with O2 dependent COPD, non-compliant with therapy and actively smoking presenting with COPD exacerbation in setting of rhinovirus infection, resulting in acute on chronic respiratory failure requiring mechanical support.  Also has history of VCD from anxiety.  Anticipate slow recovery.  DNR as of conversation 12/30  Issues: Acute on chronic respiratory failure > slow improvement COPD exacerbation > slowly improving Rhinovirus infection Cocaine abuse Tobacco abuse   - target SaO2 88-92%  - Maintain in the ICU - BIPAP prn - Solumedrol> decrease to 40mg  IV q12h today - PRN Albuterol - Duonebs, pulmicort - Smoking cessation encouraged - continue guaifenesin - out of bed 12/31  Patient's husband updated bedside 12/30. Palliative care note reviewed, appreciate their support.  Code status DNR, continue current level of care. Overall prognosis poor.   Will be available prn 1/1   Roselie Awkward, MD [facility] Pager: [phone] Cell: [phone] After 3pm or if no response, call [phone]

## 2016-10-06 NOTE — Progress Notes (Signed)
PROGRESS NOTE  Courtney Terry:[id number] DOB: 1960/12/21 DOA: 09/28/2016 PCP: Pcp Not In System  HPI/Recap of past 24 hours: Patient is a 55 year old female past mental history of chronic respiratory failure on 2 L nasal cannula with ongoing polysubstance abuse including tobacco, bipolar disorder and COPD who was admitted on 12/23 for COPD exacerbation. Following admission, patient was positive for rhinovirus. She needed to be kept in the ICU on BiPAP. Pulmonary consulted. Patient's improvement over the past week has been slow but steady. Her overall prognosis is poor though. She is expected to only have weeks to months. Palliative care was consulted who met with the patient on 12/30 for goals of care. Following that meeting, patient wished to be a DO NOT RESUSCITATE, however otherwise wanted aggressive medical management.  Overnight, no issues. Patient has been able to stay off of BiPAP and stay on nasal cannula. She is sleeping this morning. No complaints voiced to nursing  Assessment/Plan: Principal Problem:   Acute on chronic respiratory failure with hypoxia (HCC) secondary to exacerbation of COPD Gold stage IV brought on by rhinovirus infection: Chest x-ray on admission negative for infiltrate. Hopefully she will be able to stay off of BiPAP. Influenza PCR negative, although positive for rhinovirus. Slow weaning of Solu-Medrol. Continue bronchodilators. Also on Pulmicort, PPI, Claritin and Flonase. Active Problems:   Breast cancer of upper-outer quadrant of right female breast [facility]): On Arimidex     Cigarette smoker/tobacco abuse: Nicotine patch.    Protein-calorie malnutrition, severe: Patient meets criteria in the context of chronic illness. Also underweight. Seen by nutrition. Put on ensure 3 times a day plus multivitamin   Schizophrenia (HCC)/bipolar 1 disorder: Continue Depakote   Cocaine abuse: Counseled.   Hyperkalemia: Resolved. Potassium levels remain normal.  Hypothyroidism: Continue Synthroid    Acute encephalopathy: Secondary to hypoxia from COPD exacerbation. Resolved.  Code Status: DO NOT RESUSCITATE   Family Communication: Left message for husband   Disposition Plan: Long-term prognosis poor. We'll try to move out of stepdown if she is able to stay off of BiPAP for the next 24 hours.    Consultants:  Pulmonary  Palliative medicine   Procedures:  None   Antimicrobials:  None   DVT prophylaxis: SCDs   Objective: Vitals:   10/06/16 0800 10/06/16 1000 10/06/16 1148 10/06/16 1150  BP: (!) 147/93 135/68    Pulse:    (!) 125  Resp: 19 16    Temp: 98.6 F (37 C)     TempSrc: Oral     SpO2: (!) 89% (!) 88% (!) 89%   Weight:      Height:        Intake/Output Summary (Last 24 hours) at 10/06/16 1202 Last data filed at 10/06/16 0600  Gross per 24 hour  Intake                0 ml  Output              825 ml  Net             -825 ml   Filed Weights   10/03/16 0100 10/04/16 0100 10/06/16 0500  Weight: 47.9 kg (105 lb 9.6 oz) 44.8 kg (98 lb 12.3 oz) 53.4 kg (117 lb 11.6 oz)    Exam:   General:  Resting comfortably, no acute distress   Cardiovascular: Regular rate and rhythm, S1-S2   Respiratory: Decreased breath sounds throughout   Abdomen: Soft, nontender, nondistended, positive bowel sounds   Musculoskeletal:  No clubbing or cyanosis or edema   Skin: No skin breaks, tears or lesions  Psychiatry: No acute psychoses    Data Reviewed: CBC:  Recent Labs Lab 09/30/16 0322 10/01/16 0313 10/02/16 0349  WBC 7.5 6.8 6.1  HGB 12.1 13.1 13.8  HCT 39.0 41.2 44.6  MCV 86.1 85.1 85.8  PLT 243 263 370   Basic Metabolic Panel:  Recent Labs Lab 09/30/16 0322 10/01/16 0313 10/02/16 0349 10/04/16 0927 10/05/16 0332 10/06/16 0430  NA 137 141 138 136 139 139  K 5.0 4.1 4.1 3.9 4.3 4.6  CL 99* 94* 87* 88* 90* 91*  CO2 31 37* 43* 39* 40* 38*  GLUCOSE 101* 122* 120* 179* 119* 131*  BUN 20 21* 22* 39* 34*  33*  CREATININE 0.61 0.66 0.62 0.59 0.46 0.44  CALCIUM 8.8* 9.3 9.0 9.2 9.5 9.3  MG 1.9 1.9  --   --  2.3 2.1  PHOS  --  4.4  --   --  3.7 3.5   GFR: Estimated Creatinine Clearance: 67.8 mL/min (by C-G formula based on SCr of 0.44 mg/dL). Liver Function Tests: No results for input(s): AST, ALT, ALKPHOS, BILITOT, PROT, ALBUMIN in the last 168 hours. No results for input(s): LIPASE, AMYLASE in the last 168 hours. No results for input(s): AMMONIA in the last 168 hours. Coagulation Profile: No results for input(s): INR, PROTIME in the last 168 hours. Cardiac Enzymes: No results for input(s): CKTOTAL, CKMB, CKMBINDEX, TROPONINI in the last 168 hours. BNP (last 3 results) No results for input(s): PROBNP in the last 8760 hours. HbA1C: No results for input(s): HGBA1C in the last 72 hours. CBG: No results for input(s): GLUCAP in the last 168 hours. Lipid Profile: No results for input(s): CHOL, HDL, LDLCALC, TRIG, CHOLHDL, LDLDIRECT in the last 72 hours. Thyroid Function Tests: No results for input(s): TSH, T4TOTAL, FREET4, T3FREE, THYROIDAB in the last 72 hours. Anemia Panel: No results for input(s): VITAMINB12, FOLATE, FERRITIN, TIBC, IRON, RETICCTPCT in the last 72 hours. Urine analysis:    Component Value Date/Time   COLORURINE YELLOW 08/11/2016 0318   APPEARANCEUR CLOUDY (A) 08/11/2016 0318   LABSPEC 1.018 08/11/2016 0318   PHURINE 5.5 08/11/2016 0318   GLUCOSEU NEGATIVE 08/11/2016 0318   HGBUR NEGATIVE 08/11/2016 0318   BILIRUBINUR NEGATIVE 08/11/2016 0318   KETONESUR NEGATIVE 08/11/2016 0318   PROTEINUR 30 (A) 08/11/2016 0318   UROBILINOGEN 0.2 03/06/2014 1921   NITRITE NEGATIVE 08/11/2016 0318   LEUKOCYTESUR NEGATIVE 08/11/2016 0318   Sepsis Labs: _0 (procalcitonin:4,lacticidven:4)  ) Recent Results (from the past 240 hour(s))  MRSA PCR Screening     Status: None   Collection Time: 09/28/16  1:42 PM  Result Value Ref Range Status   MRSA by PCR NEGATIVE  NEGATIVE Final    Comment:        The GeneXpert MRSA Assay (FDA approved for NASAL specimens only), is one component of a comprehensive MRSA colonization surveillance program. It is not intended to diagnose MRSA infection nor to guide or monitor treatment for MRSA infections.   Respiratory Panel by PCR     Status: Abnormal   Collection Time: 09/29/16 11:07 AM  Result Value Ref Range Status   Adenovirus NOT DETECTED NOT DETECTED Final   Coronavirus 229E NOT DETECTED NOT DETECTED Final   Coronavirus HKU1 NOT DETECTED NOT DETECTED Final   Coronavirus NL63 NOT DETECTED NOT DETECTED Final   Coronavirus OC43 NOT DETECTED NOT DETECTED Final   Metapneumovirus NOT DETECTED NOT DETECTED Final  Rhinovirus / Enterovirus DETECTED (A) NOT DETECTED Final   Influenza A NOT DETECTED NOT DETECTED Final   Influenza B NOT DETECTED NOT DETECTED Final   Parainfluenza Virus 1 NOT DETECTED NOT DETECTED Final   Parainfluenza Virus 2 NOT DETECTED NOT DETECTED Final   Parainfluenza Virus 3 NOT DETECTED NOT DETECTED Final   Parainfluenza Virus 4 NOT DETECTED NOT DETECTED Final   Respiratory Syncytial Virus NOT DETECTED NOT DETECTED Final   Bordetella pertussis NOT DETECTED NOT DETECTED Final   Chlamydophila pneumoniae NOT DETECTED NOT DETECTED Final   Mycoplasma pneumoniae NOT DETECTED NOT DETECTED Final    Comment: Performed at [facility]  Culture, expectorated sputum-assessment     Status: None   Collection Time: 10/02/16 10:52 AM  Result Value Ref Range Status   Specimen Description SPUTUM  Final   Special Requests NONE  Final   Sputum evaluation THIS SPECIMEN IS ACCEPTABLE FOR SPUTUM CULTURE  Final   Report Status 10/02/2016 FINAL  Final  Culture, respiratory (NON-Expectorated)     Status: None   Collection Time: 10/02/16 10:52 AM  Result Value Ref Range Status   Specimen Description SPUTUM  Final   Special Requests NONE Reflexed from [id number]  Final   Gram Stain   Final    ABUNDANT WBC  PRESENT,BOTH PMN AND MONONUCLEAR ABUNDANT GRAM POSITIVE COCCI IN PAIRS FEW GRAM POSITIVE RODS FEW GRAM NEGATIVE RODS MODERATE YEAST RARE GRAM NEGATIVE COCCI IN PAIRS    Culture   Final    Consistent with normal respiratory flora. Performed at [facility]    Report Status 10/04/2016 FINAL  Final      Studies: No results found.  Scheduled Meds: . amLODipine  5 mg Oral Daily  . anastrozole  1 mg Oral Daily  . budesonide (PULMICORT) nebulizer solution  0.25 mg Nebulization BID  . divalproex  500 mg Oral Q12H  . feeding supplement (ENSURE ENLIVE)  237 mL Oral TID WC  . FLUoxetine  20 mg Oral Daily  . fluticasone  2 spray Each Nare Daily  . guaiFENesin  1,200 mg Oral BID  . heparin  5,000 Units Subcutaneous Q8H  . ipratropium-albuterol  3 mL Nebulization Q4H  . levothyroxine  75 mcg Oral QAC breakfast  . loratadine  10 mg Oral Daily  . methylPREDNISolone sodium succinate  40 mg Intravenous Q12H  . multivitamin with minerals  1 tablet Oral Daily  . nicotine  14 mg Transdermal Daily  . paliperidone  9 mg Oral QHS  . pantoprazole  40 mg Oral Daily    Continuous Infusions: . sodium chloride Stopped (10/03/16 1900)  . albuterol Stopped (09/28/16 0459)     LOS: 8 days    Annita Brod, MD [facility] Pager [phone]  If 7PM-7AM, please contact night-coverage [email or web address] Password [email or web address] 10/06/2016, 12:02 PM

## 2016-10-07 DIAGNOSIS — F209 Schizophrenia, unspecified: Secondary | ICD-10-CM

## 2016-10-07 LAB — GLUCOSE, CAPILLARY: GLUCOSE-CAPILLARY: 147 mg/dL — AB (ref 65–99)

## 2016-10-07 LAB — BASIC METABOLIC PANEL
Anion gap: 7 (ref 5–15)
BUN: 26 mg/dL — ABNORMAL HIGH (ref 6–20)
CALCIUM: 9.1 mg/dL (ref 8.9–10.3)
CO2: 42 mmol/L — ABNORMAL HIGH (ref 22–32)
CREATININE: 0.47 mg/dL (ref 0.44–1.00)
Chloride: 92 mmol/L — ABNORMAL LOW (ref 101–111)
GFR calc non Af Amer: >60 mL/min (ref >60)
Glucose, Bld: 180 mg/dL — ABNORMAL HIGH (ref 65–99)
Potassium: 4.2 mmol/L (ref 3.5–5.1)
SODIUM: 141 mmol/L (ref 135–145)

## 2016-10-07 LAB — MAGNESIUM: MAGNESIUM: 2.1 mg/dL (ref 1.7–2.4)

## 2016-10-07 LAB — PHOSPHORUS: PHOSPHORUS: 4.4 mg/dL (ref 2.5–4.6)

## 2016-10-07 MED ORDER — IPRATROPIUM-ALBUTEROL 0.5-2.5 (3) MG/3ML IN SOLN
3.0000 mL | Freq: Four times a day (QID) | RESPIRATORY_TRACT | Status: DC
  Administered 2016-10-08 (×2): 3 mL via RESPIRATORY_TRACT
  Filled 2016-10-07 (×2): qty 3

## 2016-10-07 NOTE — Progress Notes (Signed)
Sp02 on 2L [place] = 97% Pt requests Venti mask. RT explained that vital signs are good on [place]. Pt still demands VM. RN aware.

## 2016-10-07 NOTE — Progress Notes (Signed)
PROGRESS NOTE  Courtney Terry XLK:[id number] DOB: [birth year] DOA: 09/28/2016 PCP: Pcp Not In System  HPI/Recap of past 24 hours: Patient is a 56 year old female past mental history of chronic respiratory failure on 2 L nasal cannula with ongoing polysubstance abuse including tobacco, bipolar disorder and COPD who was admitted on 12/23 for COPD exacerbation. Following admission, patient was positive for rhinovirus. She needed to be kept in the ICU on BiPAP. Pulmonary consulted. Patient's improvement over the past week has been slow but steady. Her overall prognosis is poor though. She is expected to only have weeks to months. Palliative care was consulted who met with the patient on 12/30 for goals of care. Following that meeting, patient wished to be a DO NOT RESUSCITATE, however otherwise wanted aggressive medical management.  No issues overnight. Patient has now been off of continuous BiPAP for most 24 hours. She briefly needed it, but more at her request for about 2 hours yesterday evening. This morning, able to maintain oxygen saturations greater than 88% although was sleeping, she is much of a mouth breather. Patient sleeping comfortably now. No complaints.  Assessment/Plan: Principal Problem:   Acute on chronic respiratory failure with hypoxia (HCC) secondary to exacerbation of COPD Gold stage IV brought on by rhinovirus infection: Chest x-ray on admission negative for infiltrate. Hopefully she will be able to stay off of BiPAP. Influenza PCR negative, although positive for rhinovirus. Slow weaning of Solu-Medrol. Continue bronchodilators. Also on Pulmicort, PPI, Claritin and Flonase. Active Problems:   Breast cancer of upper-outer quadrant of right female breast [facility]): On Arimidex    Cigarette smoker/tobacco abuse: Nicotine patch.    Protein-calorie malnutrition, severe: Patient meets criteria in the context of chronic illness. Also underweight. Seen by nutrition. Put on ensure 3 times a  day plus multivitamin   Schizophrenia (HCC)/bipolar 1 disorder: Continue Depakote   Cocaine abuse: Counseled.   Hyperkalemia: Resolved. Potassium levels remain normal.   Hypothyroidism: Continue Synthroid    Acute encephalopathy: Secondary to hypoxia from COPD exacerbation. Resolved.  Code Status: DO NOT RESUSCITATE   Family Communication: Husband at the bedside  Disposition Plan: Long-term prognosis poor. We'll try to move out of stepdown if she is able to stay off of BiPAP, possibly later today   Consultants:  Pulmonary  Palliative medicine   Procedures:  None   Antimicrobials:  None   DVT prophylaxis: SCDs   Objective: Vitals:   10/07/16 0500 10/07/16 0600 10/07/16 0802 10/07/16 0804  BP:  (!) 94/52    Pulse:      Resp: 12 12    Temp:   98 F (36.7 C)   TempSrc:   Axillary   SpO2: 94% 93%  96%  Weight: 54.2 kg (119 lb 7.8 oz)     Height:        Intake/Output Summary (Last 24 hours) at 10/07/16 1135 Last data filed at 10/07/16 0600  Gross per 24 hour  Intake                0 ml  Output              275 ml  Net             -275 ml   Filed Weights   10/04/16 0100 10/06/16 0500 10/07/16 0500  Weight: 44.8 kg (98 lb 12.3 oz) 53.4 kg (117 lb 11.6 oz) 54.2 kg (119 lb 7.8 oz)    Exam:   General:  Resting comfortably, no acute distress  Cardiovascular: Regular rate and rhythm, S1-S2   Respiratory: Decreased breath sounds throughout   Abdomen: Soft, nontender, nondistended, positive bowel sounds   Musculoskeletal: No clubbing or cyanosis or edema   Skin: No skin breaks, tears or lesions  Psychiatry: No acute psychoses    Data Reviewed: CBC:  Recent Labs Lab 10/01/16 0313 10/02/16 0349  WBC 6.8 6.1  HGB 13.1 13.8  HCT 41.2 44.6  MCV 85.1 85.8  PLT 263 093   Basic Metabolic Panel:  Recent Labs Lab 10/01/16 0313 10/02/16 0349 10/04/16 0927 10/05/16 0332 10/06/16 0430 10/07/16 0406  NA 141 138 136 139 139 141  K 4.1 4.1 3.9  4.3 4.6 4.2  CL 94* 87* 88* 90* 91* 92*  CO2 37* 43* 39* 40* 38* 42*  GLUCOSE 122* 120* 179* 119* 131* 180*  BUN 21* 22* 39* 34* 33* 26*  CREATININE 0.66 0.62 0.59 0.46 0.44 0.47  CALCIUM 9.3 9.0 9.2 9.5 9.3 9.1  MG 1.9  --   --  2.3 2.1 2.1  PHOS 4.4  --   --  3.7 3.5 4.4   GFR: Estimated Creatinine Clearance: 68.8 mL/min (by C-G formula based on SCr of 0.47 mg/dL). Liver Function Tests: No results for input(s): AST, ALT, ALKPHOS, BILITOT, PROT, ALBUMIN in the last 168 hours. No results for input(s): LIPASE, AMYLASE in the last 168 hours. No results for input(s): AMMONIA in the last 168 hours. Coagulation Profile: No results for input(s): INR, PROTIME in the last 168 hours. Cardiac Enzymes: No results for input(s): CKTOTAL, CKMB, CKMBINDEX, TROPONINI in the last 168 hours. BNP (last 3 results) No results for input(s): PROBNP in the last 8760 hours. HbA1C: No results for input(s): HGBA1C in the last 72 hours. CBG: No results for input(s): GLUCAP in the last 168 hours. Lipid Profile: No results for input(s): CHOL, HDL, LDLCALC, TRIG, CHOLHDL, LDLDIRECT in the last 72 hours. Thyroid Function Tests: No results for input(s): TSH, T4TOTAL, FREET4, T3FREE, THYROIDAB in the last 72 hours. Anemia Panel: No results for input(s): VITAMINB12, FOLATE, FERRITIN, TIBC, IRON, RETICCTPCT in the last 72 hours. Urine analysis:    Component Value Date/Time   COLORURINE YELLOW 08/11/2016 0318   APPEARANCEUR CLOUDY (A) 08/11/2016 0318   LABSPEC 1.018 08/11/2016 0318   PHURINE 5.5 08/11/2016 0318   GLUCOSEU NEGATIVE 08/11/2016 0318   HGBUR NEGATIVE 08/11/2016 0318   BILIRUBINUR NEGATIVE 08/11/2016 0318   KETONESUR NEGATIVE 08/11/2016 0318   PROTEINUR 30 (A) 08/11/2016 0318   UROBILINOGEN 0.2 03/06/2014 1921   NITRITE NEGATIVE 08/11/2016 0318   LEUKOCYTESUR NEGATIVE 08/11/2016 0318   Sepsis Labs: @LABRCNTIP (procalcitonin:4,lacticidven:4)  ) Recent Results (from the past 240 hour(s))    MRSA PCR Screening     Status: None   Collection Time: 09/28/16  1:42 PM  Result Value Ref Range Status   MRSA by PCR NEGATIVE NEGATIVE Final    Comment:        The GeneXpert MRSA Assay (FDA approved for NASAL specimens only), is one component of a comprehensive MRSA colonization surveillance program. It is not intended to diagnose MRSA infection nor to guide or monitor treatment for MRSA infections.   Respiratory Panel by PCR     Status: Abnormal   Collection Time: 09/29/16 11:07 AM  Result Value Ref Range Status   Adenovirus NOT DETECTED NOT DETECTED Final   Coronavirus 229E NOT DETECTED NOT DETECTED Final   Coronavirus HKU1 NOT DETECTED NOT DETECTED Final   Coronavirus NL63 NOT DETECTED NOT DETECTED Final  Coronavirus OC43 NOT DETECTED NOT DETECTED Final   Metapneumovirus NOT DETECTED NOT DETECTED Final   Rhinovirus / Enterovirus DETECTED (A) NOT DETECTED Final   Influenza A NOT DETECTED NOT DETECTED Final   Influenza B NOT DETECTED NOT DETECTED Final   Parainfluenza Virus 1 NOT DETECTED NOT DETECTED Final   Parainfluenza Virus 2 NOT DETECTED NOT DETECTED Final   Parainfluenza Virus 3 NOT DETECTED NOT DETECTED Final   Parainfluenza Virus 4 NOT DETECTED NOT DETECTED Final   Respiratory Syncytial Virus NOT DETECTED NOT DETECTED Final   Bordetella pertussis NOT DETECTED NOT DETECTED Final   Chlamydophila pneumoniae NOT DETECTED NOT DETECTED Final   Mycoplasma pneumoniae NOT DETECTED NOT DETECTED Final    Comment: Performed at [facility]  Culture, expectorated sputum-assessment     Status: None   Collection Time: 10/02/16 10:52 AM  Result Value Ref Range Status   Specimen Description SPUTUM  Final   Special Requests NONE  Final   Sputum evaluation THIS SPECIMEN IS ACCEPTABLE FOR SPUTUM CULTURE  Final   Report Status 10/02/2016 FINAL  Final  Culture, respiratory (NON-Expectorated)     Status: None   Collection Time: 10/02/16 10:52 AM  Result Value Ref Range  Status   Specimen Description SPUTUM  Final   Special Requests NONE Reflexed from [id number]  Final   Gram Stain   Final    ABUNDANT WBC PRESENT,BOTH PMN AND MONONUCLEAR ABUNDANT GRAM POSITIVE COCCI IN PAIRS FEW GRAM POSITIVE RODS FEW GRAM NEGATIVE RODS MODERATE YEAST RARE GRAM NEGATIVE COCCI IN PAIRS    Culture   Final    Consistent with normal respiratory flora. Performed at [facility]    Report Status 10/04/2016 FINAL  Final      Studies: No results found.  Scheduled Meds: . amLODipine  5 mg Oral Daily  . anastrozole  1 mg Oral Daily  . budesonide (PULMICORT) nebulizer solution  0.25 mg Nebulization BID  . divalproex  500 mg Oral Q12H  . feeding supplement (ENSURE ENLIVE)  237 mL Oral TID WC  . FLUoxetine  20 mg Oral Daily  . fluticasone  2 spray Each Nare Daily  . guaiFENesin  1,200 mg Oral BID  . heparin  5,000 Units Subcutaneous Q8H  . ipratropium-albuterol  3 mL Nebulization Q4H  . levothyroxine  75 mcg Oral QAC breakfast  . loratadine  10 mg Oral Daily  . methylPREDNISolone sodium succinate  40 mg Intravenous Q12H  . multivitamin with minerals  1 tablet Oral Daily  . nicotine  14 mg Transdermal Daily  . paliperidone  9 mg Oral QHS  . pantoprazole  40 mg Oral Daily    Continuous Infusions: . sodium chloride Stopped (10/03/16 1900)  . albuterol Stopped (09/28/16 0459)     LOS: 9 days    Annita Brod, MD [facility] Pager [phone]  If 7PM-7AM, please contact night-coverage [email or web address] Password [email or web address] 10/07/2016, 11:35 AM

## 2016-10-08 DIAGNOSIS — Z515 Encounter for palliative care: Secondary | ICD-10-CM

## 2016-10-08 DIAGNOSIS — Z7189 Other specified counseling: Secondary | ICD-10-CM

## 2016-10-08 MED ORDER — IPRATROPIUM-ALBUTEROL 0.5-2.5 (3) MG/3ML IN SOLN
3.0000 mL | Freq: Two times a day (BID) | RESPIRATORY_TRACT | Status: DC
  Administered 2016-10-08 – 2016-10-09 (×2): 3 mL via RESPIRATORY_TRACT
  Filled 2016-10-08 (×3): qty 3

## 2016-10-08 MED ORDER — PREDNISONE 20 MG PO TABS
40.0000 mg | ORAL_TABLET | Freq: Every day | ORAL | Status: DC
  Administered 2016-10-09: 40 mg via ORAL
  Filled 2016-10-08: qty 2

## 2016-10-08 MED ORDER — PREDNISONE 10 MG PO TABS: 10.0000 mg | ORAL_TABLET | Freq: Every day | ORAL | Status: DC

## 2016-10-08 MED ORDER — PREDNISONE 20 MG PO TABS: 20.0000 mg | ORAL_TABLET | Freq: Every day | ORAL | Status: DC

## 2016-10-08 MED ORDER — PREDNISONE 20 MG PO TABS: 30.0000 mg | ORAL_TABLET | Freq: Every day | ORAL | Status: DC

## 2016-10-08 MED ORDER — ALBUTEROL SULFATE (2.5 MG/3ML) 0.083% IN NEBU: 2.5000 mg | INHALATION_SOLUTION | RESPIRATORY_TRACT | Status: DC | PRN

## 2016-10-08 NOTE — Progress Notes (Signed)
PROGRESS NOTE  Courtney Terry:[id number] DOB: 1961-04-29 DOA: 09/28/2016 PCP: Pcp Not In System  HPI/Recap of past 24 hours: Patient is a 56 year old female past mental history of chronic respiratory failure on 2 L nasal cannula with ongoing polysubstance abuse including tobacco, bipolar disorder and COPD who was admitted on 12/23 for COPD exacerbation. Following admission, patient was positive for rhinovirus. She needed to be kept in the ICU on BiPAP. Pulmonary consulted. Patient's improvement over the past week has been slow but steady. Her overall prognosis is poor though. She is expected to only have weeks to months. Palliative care was consulted who met with the patient on 12/30 for goals of care. Following that meeting, patient wished to be a DO NOT RESUSCITATE, however otherwise wanted aggressive medical management.  No issues overnight. Patient has now been off of continuous BiPAP for almost 48 hours. At times, she will request a Venturi mask, but this is more for personal comfort as she is able to tolerate 2 L nasal cannula. This morning, she is sleeping comfortably. No complaints.  Assessment/Plan: Principal Problem:   Acute on chronic respiratory failure with hypoxia (HCC) secondary to exacerbation of COPD Gold stage IV brought on by rhinovirus infection: Chest x-ray on admission negative for infiltrate. She has been able now to stay off of BiPAP. Influenza PCR negative, although positive for rhinovirus. Slow weaning of prednisone. Continue bronchodilators. Also on Pulmicort, PPI, Claritin and Flonase. Active Problems:   Breast cancer of upper-outer quadrant of right female breast [facility]): On Arimidex    Cigarette smoker/tobacco abuse: Nicotine patch.    Protein-calorie malnutrition, severe: Patient meets criteria in the context of chronic illness. Also underweight. Seen by nutrition. Put on ensure 3 times a day plus multivitamin   Schizophrenia (HCC)/bipolar 1 disorder: Continue  Depakote   Cocaine abuse: Counseled.   Hyperkalemia: Resolved. Potassium levels remain normal.   Hypothyroidism: Continue Synthroid    Acute encephalopathy: Secondary to hypoxia from COPD exacerbation. Resolved.  Code Status: DO NOT RESUSCITATE   Family Communication: Left message with husband  Disposition Plan: Long-term prognosis poor. Transfer out of stepdown today. Anticipate discharge possibly 1/3   Consultants:  Pulmonary  Palliative medicine   Procedures:  None   Antimicrobials:  None   DVT prophylaxis: SCDs   Objective: Vitals:   10/08/16 0800 10/08/16 1000 10/08/16 1200 10/08/16 1201  BP:  129/69    Pulse:      Resp:  13    Temp: 97.9 F (36.6 C)  98.2 F (36.8 C)   TempSrc: Axillary  Axillary   SpO2:  95%  95%  Weight:      Height:        Intake/Output Summary (Last 24 hours) at 10/08/16 1318 Last data filed at 10/08/16 0618  Gross per 24 hour  Intake                0 ml  Output              700 ml  Net             -700 ml   Filed Weights   10/06/16 0500 10/07/16 0500 10/08/16 0500  Weight: 53.4 kg (117 lb 11.6 oz) 54.2 kg (119 lb 7.8 oz) 48.3 kg (106 lb 7.7 oz)    Exam:   General:  Resting comfortably, no acute distress   Cardiovascular: Regular rate and rhythm, S1-S2   Respiratory: Decreased breath sounds throughout   Abdomen: Soft, nontender, nondistended,  positive bowel sounds   Musculoskeletal: No clubbing or cyanosis or edema   Skin: No skin breaks, tears or lesions  Psychiatry: No acute psychoses    Data Reviewed: CBC:  Recent Labs Lab 10/02/16 0349  WBC 6.1  HGB 13.8  HCT 44.6  MCV 85.8  PLT 161   Basic Metabolic Panel:  Recent Labs Lab 10/02/16 0349 10/04/16 0927 10/05/16 0332 10/06/16 0430 10/07/16 0406  NA 138 136 139 139 141  K 4.1 3.9 4.3 4.6 4.2  CL 87* 88* 90* 91* 92*  CO2 43* 39* 40* 38* 42*  GLUCOSE 120* 179* 119* 131* 180*  BUN 22* 39* 34* 33* 26*  CREATININE 0.62 0.59 0.46 0.44 0.47    CALCIUM 9.0 9.2 9.5 9.3 9.1  MG  --   --  2.3 2.1 2.1  PHOS  --   --  3.7 3.5 4.4   GFR: Estimated Creatinine Clearance: 61.3 mL/min (by C-G formula based on SCr of 0.47 mg/dL). Liver Function Tests: No results for input(s): AST, ALT, ALKPHOS, BILITOT, PROT, ALBUMIN in the last 168 hours. No results for input(s): LIPASE, AMYLASE in the last 168 hours. No results for input(s): AMMONIA in the last 168 hours. Coagulation Profile: No results for input(s): INR, PROTIME in the last 168 hours. Cardiac Enzymes: No results for input(s): CKTOTAL, CKMB, CKMBINDEX, TROPONINI in the last 168 hours. BNP (last 3 results) No results for input(s): PROBNP in the last 8760 hours. HbA1C: No results for input(s): HGBA1C in the last 72 hours. CBG:  Recent Labs Lab 10/07/16 2158  GLUCAP 147*   Lipid Profile: No results for input(s): CHOL, HDL, LDLCALC, TRIG, CHOLHDL, LDLDIRECT in the last 72 hours. Thyroid Function Tests: No results for input(s): TSH, T4TOTAL, FREET4, T3FREE, THYROIDAB in the last 72 hours. Anemia Panel: No results for input(s): VITAMINB12, FOLATE, FERRITIN, TIBC, IRON, RETICCTPCT in the last 72 hours. Urine analysis:    Component Value Date/Time   COLORURINE YELLOW 08/11/2016 0318   APPEARANCEUR CLOUDY (A) 08/11/2016 0318   LABSPEC 1.018 08/11/2016 0318   PHURINE 5.5 08/11/2016 0318   GLUCOSEU NEGATIVE 08/11/2016 0318   HGBUR NEGATIVE 08/11/2016 0318   BILIRUBINUR NEGATIVE 08/11/2016 0318   KETONESUR NEGATIVE 08/11/2016 0318   PROTEINUR 30 (A) 08/11/2016 0318   UROBILINOGEN 0.2 03/06/2014 1921   NITRITE NEGATIVE 08/11/2016 0318   LEUKOCYTESUR NEGATIVE 08/11/2016 0318   Sepsis Labs: _0 (procalcitonin:4,lacticidven:4)  ) Recent Results (from the past 240 hour(s))  MRSA PCR Screening     Status: None   Collection Time: 09/28/16  1:42 PM  Result Value Ref Range Status   MRSA by PCR NEGATIVE NEGATIVE Final    Comment:        The GeneXpert MRSA Assay  (FDA approved for NASAL specimens only), is one component of a comprehensive MRSA colonization surveillance program. It is not intended to diagnose MRSA infection nor to guide or monitor treatment for MRSA infections.   Respiratory Panel by PCR     Status: Abnormal   Collection Time: 09/29/16 11:07 AM  Result Value Ref Range Status   Adenovirus NOT DETECTED NOT DETECTED Final   Coronavirus 229E NOT DETECTED NOT DETECTED Final   Coronavirus HKU1 NOT DETECTED NOT DETECTED Final   Coronavirus NL63 NOT DETECTED NOT DETECTED Final   Coronavirus OC43 NOT DETECTED NOT DETECTED Final   Metapneumovirus NOT DETECTED NOT DETECTED Final   Rhinovirus / Enterovirus DETECTED (A) NOT DETECTED Final   Influenza A NOT DETECTED NOT DETECTED Final   Influenza  B NOT DETECTED NOT DETECTED Final   Parainfluenza Virus 1 NOT DETECTED NOT DETECTED Final   Parainfluenza Virus 2 NOT DETECTED NOT DETECTED Final   Parainfluenza Virus 3 NOT DETECTED NOT DETECTED Final   Parainfluenza Virus 4 NOT DETECTED NOT DETECTED Final   Respiratory Syncytial Virus NOT DETECTED NOT DETECTED Final   Bordetella pertussis NOT DETECTED NOT DETECTED Final   Chlamydophila pneumoniae NOT DETECTED NOT DETECTED Final   Mycoplasma pneumoniae NOT DETECTED NOT DETECTED Final    Comment: Performed at [facility]  Culture, expectorated sputum-assessment     Status: None   Collection Time: 10/02/16 10:52 AM  Result Value Ref Range Status   Specimen Description SPUTUM  Final   Special Requests NONE  Final   Sputum evaluation THIS SPECIMEN IS ACCEPTABLE FOR SPUTUM CULTURE  Final   Report Status 10/02/2016 FINAL  Final  Culture, respiratory (NON-Expectorated)     Status: None   Collection Time: 10/02/16 10:52 AM  Result Value Ref Range Status   Specimen Description SPUTUM  Final   Special Requests NONE Reflexed from [id number]  Final   Gram Stain   Final    ABUNDANT WBC PRESENT,BOTH PMN AND MONONUCLEAR ABUNDANT GRAM POSITIVE  COCCI IN PAIRS FEW GRAM POSITIVE RODS FEW GRAM NEGATIVE RODS MODERATE YEAST RARE GRAM NEGATIVE COCCI IN PAIRS    Culture   Final    Consistent with normal respiratory flora. Performed at [facility]    Report Status 10/04/2016 FINAL  Final      Studies: No results found.  Scheduled Meds: . amLODipine  5 mg Oral Daily  . anastrozole  1 mg Oral Daily  . budesonide (PULMICORT) nebulizer solution  0.25 mg Nebulization BID  . divalproex  500 mg Oral Q12H  . feeding supplement (ENSURE ENLIVE)  237 mL Oral TID WC  . FLUoxetine  20 mg Oral Daily  . fluticasone  2 spray Each Nare Daily  . guaiFENesin  1,200 mg Oral BID  . heparin  5,000 Units Subcutaneous Q8H  . ipratropium-albuterol  3 mL Nebulization QID  . levothyroxine  75 mcg Oral QAC breakfast  . loratadine  10 mg Oral Daily  . multivitamin with minerals  1 tablet Oral Daily  . nicotine  14 mg Transdermal Daily  . paliperidone  9 mg Oral QHS  . pantoprazole  40 mg Oral Daily  . predniSONE  40 mg Oral Q breakfast   Followed by  . [START ON 10/11/2016] predniSONE  30 mg Oral Q breakfast   Followed by  . [START ON 10/14/2016] predniSONE  20 mg Oral Q breakfast   Followed by  . [START ON 10/16/2016] predniSONE  10 mg Oral Q breakfast    Continuous Infusions: . sodium chloride Stopped (10/03/16 1900)     LOS: 10 days    Annita Brod, MD [facility] Pager [phone]  If 7PM-7AM, please contact night-coverage [email or web address] Password [email or web address] 10/08/2016, 1:18 PM

## 2016-10-08 NOTE — Progress Notes (Signed)
   Name: Courtney Terry MRN: [id number] DOB: 05-08-1961    ADMISSION DATE:  09/28/2016 CONSULTATION DATE:  09/28/2016  REFERRING MD :  Courtney Terry  CHIEF COMPLAINT:  Acute on chronic respiratory failure  BRIEF PATIENT DESCRIPTION: 56 year old female with history of O2 dep COPD (FEV 23% predicted in 2014) and non-compliance with medical therapy who is still actively smoking presenting to the hospital with worsening SOB for a week.  Reports wheezing, cough and increased sputum production with subjective fever and chills.  Patient is on BiPAP for worsening hypercarbia and unable to provide further history.  SIGNIFICANT EVENTS  12/23 admission to hospital for COPD exacerbation 12/30 Palliative consult: DNR, continue full support  STUDIES:  CXR 12/23 without acute disease RVP 12/24: Positive for rhinovirus CTA 12/2: Emphysematous changes, No acute PE or infiltrate  SUBJECTIVE:  Has remained off of BIPAP Feels a little better   VITAL SIGNS: Temp:  [97.9 F (36.6 C)-99 F (37.2 C)] 97.9 F (36.6 C) (01/02 0800) Resp:  [10-20] 13 (01/02 1000) BP: (96-144)/(57-89) 129/69 (01/02 1000) SpO2:  [92 %-100 %] 95 % (01/02 1000) FiO2 (%):  [50 %] 50 % (01/01 1935) Weight:  [106 lb 7.7 oz (48.3 kg)] 106 lb 7.7 oz (48.3 kg) (01/02 0500)  PHYSICAL EXAMINATION: General:  Chronically ill appearing, no accessory muscle use HENT: NCAT OP clear PULM: Wheezing bilaterally, poor air movement CV: RRR, no mgr GI: BS+, soft, nontender MSK: normal bulk and tone Neuro: awake, alert, no distress  Recent Labs Lab 10/05/16 0332 10/06/16 0430 10/07/16 0406  NA 139 139 141  K 4.3 4.6 4.2  CL 90* 91* 92*  CO2 40* 38* 42*  BUN 34* 33* 26*  CREATININE 0.46 0.44 0.47  GLUCOSE 119* 131* 180*    Recent Labs Lab 10/02/16 0349  HGB 13.8  HCT 44.6  WBC 6.1  PLT 256   No results found.Images personally reviewed.  ASSESSMENT / PLAN: 56 year old female with O2 dependent COPD, non-compliant  with therapy and actively smoking presenting with COPD exacerbation in setting of rhinovirus infection, resulting in acute on chronic respiratory failure requiring mechanical support.  Also has history of VCD from anxiety.  Recovery will be slow, prognosis is poor.  Hospice would be appropriate.  DNR as of conversation 12/30  Issues: Acute on chronic respiratory failure > slow improvement COPD exacerbation > slowly improving Rhinovirus infection Cocaine abuse Tobacco abuse   - target SaO2 88-92%  - will write transfer orders to med-surg - Solumedrol> change to prednisone taper: Take 40mg  po daily for 3 days, then take 30mg  po daily for 3 days, then take 20mg  po daily for two days, then take 10mg  po daily for 2 days - PRN Albuterol - Duonebs, pulmicort - Smoking cessation encouraged - continue guaifenesin - out of bed 1/2  Patient's husband updated bedside 12/30.  Palliative care note reviewed, appreciate their support.  Code status DNR, continue current level of care. Overall prognosis poor.   PCCM will sign off, call if questions  Follow up with Dr. Melvyn Novas 1/24 2:45   Courtney Awkward, MD [place] [facility] Pager: [phone] Cell: [phone] After 3pm or if no response, call [phone]

## 2016-10-08 NOTE — Progress Notes (Signed)
Daily Progress Note   Patient Name: BELINA MANDILE       Date: 10/08/2016 DOB: 1961/02/27  Age: 56 y.o. MRN#: [id number] Attending Physician: Annita Brod, MD Primary Care Physician: Pcp Not In System Admit Date: 09/28/2016  Reason for Consultation/Follow-up: Establishing goals of care  Subjective: Met this evening with Ms. Placido Sou.  She reports feeling much better.  We talked again about her clinical course and continued decline in functional status over the past several months.    We discussed that the hospital can be useful as long as she is getting well enough from care she receives at the hospital to enjoy her time at home, but there is going to come a time in the near future where, if her goal is to be at home, she may be better served to plan on being at home and bringing care to her at home rather repeated trips to the hospital. We discussed hospice as a tool that may be beneficial in this goal as she has reached a point where we are trying to fix problems that are not fixable.  She reports understanding that she is not going to "get better," but she is clear in her wishes to continue to come to the hospital as she believes she still receives benefit from being in the hospital.  She is hopeful if she "do what I am supposed to, maybe God will give me more time."  Length of Stay: 10  Current Medications: Scheduled Meds:  . amLODipine  5 mg Oral Daily  . anastrozole  1 mg Oral Daily  . budesonide (PULMICORT) nebulizer solution  0.25 mg Nebulization BID  . divalproex  500 mg Oral Q12H  . feeding supplement (ENSURE ENLIVE)  237 mL Oral TID WC  . FLUoxetine  20 mg Oral Daily  . fluticasone  2 spray Each Nare Daily  . guaiFENesin  1,200 mg Oral BID  . heparin  5,000 Units  Subcutaneous Q8H  . ipratropium-albuterol  3 mL Nebulization BID  . levothyroxine  75 mcg Oral QAC breakfast  . loratadine  10 mg Oral Daily  . multivitamin with minerals  1 tablet Oral Daily  . nicotine  14 mg Transdermal Daily  . paliperidone  9 mg Oral QHS  . pantoprazole  40 mg Oral Daily  .  predniSONE  40 mg Oral Q breakfast   Followed by  . [START ON 10/11/2016] predniSONE  30 mg Oral Q breakfast   Followed by  . [START ON 10/14/2016] predniSONE  20 mg Oral Q breakfast   Followed by  . [START ON 10/16/2016] predniSONE  10 mg Oral Q breakfast    Continuous Infusions: . sodium chloride Stopped (10/03/16 1900)    PRN Meds: acetaminophen **OR** acetaminophen, albuterol, diphenhydrAMINE, hydrALAZINE, ondansetron **OR** ondansetron (ZOFRAN) IV  Physical Exam     General: Alert, awake, in no acute distress.  HEENT: No bruits, no goiter, no JVD Heart: Regular rate and rhythm. No murmur appreciated. Lungs: Decreased air movement, clear Abdomen: Soft, nontender, nondistended, positive bowel sounds.  Ext: No significant edema Skin: Warm and dry Neuro: Grossly intact, nonfocal.       Vital Signs: BP 127/76   Pulse (!) 125   Temp 98.1 F (36.7 C) (Axillary)   Resp 13   Ht '5\' 10"'$  (1.778 m)   Wt 48.3 kg (106 lb 7.7 oz)   SpO2 92%   BMI 15.28 kg/m  SpO2: SpO2: 92 % O2 Device: O2 Device: Nasal Cannula O2 Flow Rate: O2 Flow Rate (L/min): 2 L/min  Intake/output summary:  Intake/Output Summary (Last 24 hours) at 10/08/16 1845 Last data filed at 10/08/16 8676  Gross per 24 hour  Intake                0 ml  Output              700 ml  Net             -700 ml   LBM: Last BM Date: 10/08/16 Baseline Weight: Weight: 59 kg (130 lb) Most recent weight: Weight: 48.3 kg (106 lb 7.7 oz)       Palliative Assessment/Data:    Flowsheet Rows   Flowsheet Row Most Recent Value  Intake Tab  Referral Department  Critical care  Unit at Time of Referral  Intermediate Care Unit    Palliative Care Primary Diagnosis  Pulmonary  Palliative Care Type  New Palliative care  Reason for referral  Clarify Goals of Care  Date first seen by Palliative Care  10/05/16  Clinical Assessment  Palliative Performance Scale Score  40%  Pain Max last 24 hours  4  Pain Min Last 24 hours  3  Dyspnea Max Last 24 Hours  6  Dyspnea Min Last 24 hours  4  Nausea Max Last 24 Hours  2  Nausea Min Last 24 Hours  1  Anxiety Max Last 24 Hours  4  Anxiety Min Last 24 Hours  3  Psychosocial & Spiritual Assessment  Palliative Care Outcomes  Patient/Family meeting held?  Yes  Who was at the meeting?  Patient, husband  Palliative Care Outcomes  Clarified goals of care, Completed durable DNR      Patient Active Problem List   Diagnosis Date Noted  . Hypoxia   . Rhinovirus infection 09/30/2016  . Hyperkalemia 09/29/2016  . Hypothyroidism 09/29/2016  . Bipolar I disorder ([place]) 09/29/2016  . Respiratory acidosis   . Acute encephalopathy   . Acute on chronic respiratory failure with hypoxia ([place]) 09/28/2016  . Polysubstance abuse 08/13/2016  . Cocaine abuse 08/13/2016  . Respiratory failure with hypercapnia ([place]) 08/10/2016  . Schizophrenia ([place]) 04/23/2016  . COPD (chronic obstructive pulmonary disease) ([place]) 04/23/2016  . Protein-calorie malnutrition, severe 02/27/2016  . Elevated troponin   . History of breast cancer  10/20/2015  . Exposure of implanted prstht mtrl to surrnd org/tiss, init 06/19/2015  . Complication of internal breast prosthesis 06/19/2015  . Acquired absence of breast and nipple 06/06/2015  . COPD exacerbation ([place]) 06/29/2014  . Acute respiratory failure with hypoxia ([place]) 06/15/2013  . COPD GOLD IV with reversibility/ still smoking 02/27/2013  . Cigarette smoker 02/27/2013  . Breast cancer of upper-outer quadrant of right female breast ([place]) 01/31/2012    Palliative Care Assessment & Plan   Patient Profile:  56 year old lady with oxygen dependent COPD,  history of noncompliance, history of ongoing active smoking use, recent history of acute on chronic respiratory failure requiring intubation and mechanical ventilation not too long ago, rhinovirus infection, anxiety, acute encephalopathy deemed secondary to hypercarbia.  Recommendations/Plan: - Patient is clear today that she desires to continue to seek hospital based care for her condition.  She is not interested in hospice at this time. - We discussed her prognosis (weeks to months) again today.  She reports understanding this, but also is hopeful that God will "give me more time."   Code Status:    Code Status Orders        Start     Ordered   10/05/16 1002  Do not attempt resuscitation (DNR)  Continuous    Question Answer Comment  In the event of cardiac or respiratory ARREST Do not call a "code blue"   In the event of cardiac or respiratory ARREST Do not perform Intubation, CPR, defibrillation or ACLS   In the event of cardiac or respiratory ARREST Use medication by any route, position, wound care, and other measures to relive pain and suffering. May use oxygen, suction and manual treatment of airway obstruction as needed for comfort.      10/05/16 1001    Code Status History    Date Active Date Inactive Code Status Order ID Comments User Context   09/28/2016  8:04 AM 10/05/2016 10:01 AM Full Code [id number]  Deneise Lever, MD ED   08/11/2016  1:09 AM 08/15/2016  3:47 PM Full Code [id number]  Beverely Low, MD Inpatient   02/25/2016 10:08 PM 02/28/2016  2:53 PM Full Code [id number]  Lily Kocher, MD Inpatient   11/07/2015  5:49 PM 11/08/2015 12:52 PM Full Code [id number]  Irene Limbo, MD Inpatient   10/20/2015  6:53 PM 10/22/2015  1:23 PM Full Code [id number]  Irene Limbo, MD Inpatient   06/19/2015  7:46 PM 06/21/2015  7:06 PM Full Code [id number]  Irene Limbo, MD Inpatient   06/19/2015  2:45 PM 06/19/2015  7:46 PM Full Code [id number]  Irene Limbo, MD Inpatient   06/06/2015   3:55 PM 06/07/2015 12:30 PM Full Code [id number]  Irene Limbo, MD Inpatient   03/14/2015  6:56 PM 03/15/2015  2:26 PM Full Code [id number]  Irene Limbo, MD Inpatient   06/29/2014  5:51 PM 07/05/2014  4:13 PM Full Code [id number]  Thurnell Lose, MD Inpatient       Prognosis:   < 6 months.  I believe her prognosis to be less than 6 months and she should qualify for hospice if desired at any point that future  Discharge Planning:  Home with [place] was discussed with patient  Thank you for allowing the Palliative Medicine Team to assist in the care of this patient.   Time In: 1740 Time Out: 1810 Total Time 30 Prolonged Time Billed No      Greater than 50%  of this time was  spent counseling and coordinating care related to the above assessment and plan.  Micheline Rough, MD  Please contact Palliative Medicine Team phone at [phone] for questions and concerns.

## 2016-10-09 DIAGNOSIS — Z7189 Other specified counseling: Secondary | ICD-10-CM

## 2016-10-09 DIAGNOSIS — Z515 Encounter for palliative care: Secondary | ICD-10-CM

## 2016-10-09 DIAGNOSIS — F1410 Cocaine abuse, uncomplicated: Secondary | ICD-10-CM

## 2016-10-09 MED ORDER — PREDNISONE 10 MG PO TABS: ORAL_TABLET | ORAL | 0 refills | Status: DC

## 2016-10-09 MED ORDER — BOOST / RESOURCE BREEZE PO LIQD: 1.0000 | ORAL | 5 refills | Status: DC

## 2016-10-09 MED ORDER — FLUTICASONE PROPIONATE 50 MCG/ACT NA SUSP: 2.0000 | Freq: Every day | NASAL | 2 refills | Status: DC

## 2016-10-09 MED ORDER — ENSURE ENLIVE PO LIQD: 237.0000 mL | ORAL | Status: DC

## 2016-10-09 MED ORDER — ENSURE ENLIVE PO LIQD: 237.0000 mL | ORAL | 12 refills | Status: DC

## 2016-10-09 MED ORDER — BOOST / RESOURCE BREEZE PO LIQD: 1.0000 | ORAL | Status: DC

## 2016-10-09 MED ORDER — ANASTROZOLE 1 MG PO TABS: 1.0000 mg | ORAL_TABLET | Freq: Every day | ORAL | 0 refills | Status: DC

## 2016-10-09 MED ORDER — AMLODIPINE BESYLATE 5 MG PO TABS: 5.0000 mg | ORAL_TABLET | Freq: Every day | ORAL | 2 refills | Status: DC

## 2016-10-09 MED ORDER — NICOTINE 14 MG/24HR TD PT24: 14.0000 mg | MEDICATED_PATCH | Freq: Every day | TRANSDERMAL | 0 refills | Status: DC

## 2016-10-09 MED ORDER — POLYETHYLENE GLYCOL 3350 17 G PO PACK
17.0000 g | PACK | Freq: Every day | ORAL | Status: DC | PRN
  Administered 2016-10-09: 17 g via ORAL
  Filled 2016-10-09: qty 1

## 2016-10-09 MED ORDER — LEVOTHYROXINE SODIUM 75 MCG PO TABS: 75.0000 ug | ORAL_TABLET | Freq: Every day | ORAL | 2 refills | Status: DC

## 2016-10-09 MED ORDER — SENNOSIDES-DOCUSATE SODIUM 8.6-50 MG PO TABS
2.0000 | ORAL_TABLET | Freq: Every evening | ORAL | Status: DC | PRN
  Administered 2016-10-09: 2 via ORAL
  Filled 2016-10-09: qty 2

## 2016-10-09 NOTE — Progress Notes (Signed)
Nutrition Follow-up  DOCUMENTATION CODES:   Severe malnutrition in context of chronic illness, Underweight  INTERVENTION:  - Continue to encourage PO intakes.  - Will decrease Ensure Enlive to once/day. - Will order Boost Breeze once/day, this supplement provides 250 kcal and 9 grams of protein - RD will continue to monitor for nutrition-related needs.  NUTRITION DIAGNOSIS:   Increased nutrient needs related to catabolic illness, cancer and cancer related treatments (COPD Exacerbation, Breast Cancer) as evidenced by estimated needs. -ongoing  GOAL:   Patient will meet greater than or equal to 90% of their needs -unmet  MONITOR:   PO intake, Supplement acceptance, Labs, Weight trends, I & O's  ASSESSMENT:   56 year old female history of chronic respiratory failure on 2 L nasal cannula oxygen with ongoing tobacco abuse, bipolar disorder, history of breast cancer, history of polysubstance abuse presenting to the ED with subjective fevers, chills, wheezing, productive cough and worsening respiratory status. Patient admitted for COPD exacerbation and placed on the BiPAP.  1/3 Per chart review, 80% of breakfast and 100% of lunch completion recorded from 10/07/16. Spoke with tech who reports that pt has not been eating, has been drinking coffee, and that pt's boyfriend has been eating the food from pt's tray.   Pt sleeping soundly at this time (did not want to arouse her) and no family/visitors present. Notes from Palliative Care reviewed. Will make adjustments to oral nutrition supplements as outlined above although it is unlikely pt will consume them. Pt not appropriate for nutrition support. Many weight fluctuations since admission; will continue to monitor closely.   Medications reviewed; 75 mcg oral Synthroid/day, daily multivitamin with minerals, PRN Zofran, 40 mg oral Protonix/day, PRN Miralax, Deltasone taper, PRN Senokot. Labs reviewed; Cl: 92 mmol/L, BUN: 26 mg/dL.     12/29 -  Palliative Medicine has been consulted to discuss goals of care with patient in setting of advanced COPD, second ICU stay in 2 months for AR[facility]. - She reports good appetite now and PTA.  - She does endorse difficulty eating with increased work of breathing.  - Patient denies N/V or abdominal pain.  - Patient reports she has 100% of 2 meals daily at home.  - She was not able to provide further details on intake due to difficulty breathing.  - Patient reporting UBW 137 lbs greater than one year ago, but RD did not find any weights in chart as high as 137 lbs.  - Weight in chart during this admission and over the past year has fluctuated significantly. - Patient has been on Lasix previously this admission.  - Patient has lost approximately 10 lbs (9% body weight) over 5 months, which is significant for time frame. - Meal Completion: 0-50% of Heart Healthy/CHO Modified diet - Nutrition-Focused physical exam completed. Findings are severe fat depletion, severe muscle depletion, and no edema.  Patient meets criteria for severe chronic malnutrition in setting of 9% weight loss over 5 months, severe fat depletion, severe muscle depletion.  Patient likely not aware of consult to Palliative at this time. Patient's female companion (not in room at time of RD assessment) has been eating off of her trays so meal completion recorded in chart may not be accurate.   Diet Order:  Diet regular Room service appropriate? Yes; Fluid consistency: Thin  Skin:  Reviewed, no issues  Last BM:  1/2  Height:   Ht Readings from Last 1 Encounters:  09/28/16 5\' 10"  (1.778 m)    Weight:   Wt Readings  from Last 1 Encounters:  10/09/16 110 lb 3.7 oz (50 kg)    Ideal Body Weight:  50 kg  BMI:  Body mass index is 15.82 kg/m.  Estimated Nutritional Needs:   Kcal:  1475-1700 (HBE x 1.3-1.5)  Protein:  67-76 grams (1.5-1.7 grams/kg)  Fluid:  >/= 1.3-1.5 L/day (30-35 ml/kg)  EDUCATION NEEDS:   Education  needs no appropriate at this time    Jarome Matin, MS, RD, LDN, CNSC Inpatient Clinical Dietitian Pager # [phone] After hours/weekend pager # [phone]

## 2016-10-09 NOTE — Progress Notes (Signed)
Pt discharged home with family. Discharge instructions given and all questions answered.  

## 2016-10-09 NOTE — Discharge Summary (Signed)
Discharge Summary  KASYN APFELBAUM [id number] DOB: 1961/06/08  PCP: Bartholome Bill, MD   Admit date: 09/28/2016 Discharge date: 10/09/2016  Time spent: 25 minutes   Recommendations for Outpatient Follow-up:  1. Patient will follow-up with Dr. Melvyn Novas, pulmonary medicine in the next 2 weeks. 2. She'll follow up with her PCP in the next one month 3. New Medication: Prednisone taper 4. New medication: Norvasc 5 mg by mouth daily  Discharge Diagnoses:  Active Hospital Problems   Diagnosis Date Noted  . Acute on chronic respiratory failure with hypoxia ([place]) 09/28/2016  . Palliative care encounter   . Goals of care, counseling/discussion   . Hypoxia   . Rhinovirus infection 09/30/2016  . Hyperkalemia 09/29/2016  . Hypothyroidism 09/29/2016  . Bipolar I disorder ([place]) 09/29/2016  . Respiratory acidosis   . Acute encephalopathy   . Cocaine abuse 08/13/2016  . Schizophrenia ([place]) 04/23/2016  . Protein-calorie malnutrition, severe 02/27/2016  . COPD exacerbation ([place]) 06/29/2014  . Cigarette smoker 02/27/2013  . COPD GOLD IV with reversibility/ still smoking 02/27/2013  . Breast cancer of upper-outer quadrant of right female breast ([place]) 01/31/2012    Resolved Hospital Problems   Diagnosis Date Noted Date Resolved  No resolved problems to display.    Discharge Condition: Improved, being discharged home.  Long-term prognosis is poor.  Diet recommendation: Low-sodium with ensure supplement 3 times a day between meals  Vitals:   10/09/16 0800 10/09/16 1200  BP:  (!) 101/49  Pulse:    Resp: 20 13  Temp: 97.5 F (36.4 C)     History of present illness:  Patient is a 56 year old female past mental history of chronic respiratory failure on 2 L nasal cannula with ongoing polysubstance abuse including tobacco, bipolar disorder and COPD who was admitted on 12/23 for COPD exacerbation. Following admission, patient was positive for rhinovirus. She needed to be kept in the  ICU on [place] Hospital Course:    Acute on chronic respiratory failure with hypoxia ([place]) secondary to exacerbation of COPD Gold stage IV brought on by rhinovirus infection: Chest x-ray on admission negative for infiltrate. Pulmonary consulted. Over the next week, patient's made slow but steady improvement. It was felt that her overall prognosis is poor. She is likely expected to only have weeks to months.  Medicine consulted with the patient on 12/30 for goals of care. Following that meeting with the patient and her husband, patient wished to be a DO NOT RESUSCITATE, however once of other aggressive medical management. From 12/31, patient able to stay off of BiPAP and maintain oxygen saturations on 2 L nasal cannula.  Influenza PCR negative, although positive for rhinovirus. Steroids weaned down and then changed over to by mouth which she will continue for a slow taper. Continue bronchodilators. Also on Pulmicort, PPI, Claritin and Flonase. She will follow-up with pulmonary in the next few weeks. Active Problems:   Breast cancer of upper-outer quadrant of right female breast [facility]): On Arimidex    Cigarette smoker/tobacco abuse: Nicotine patch.    Protein-calorie malnutrition, severe: Patient meets criteria in the context of chronic illness. Also underweight. Seen by nutrition. Put on ensure 3 times a day plus multivitamin   Schizophrenia (HCC)/bipolar 1 disorder: Continue Depakote   Cocaine abuse: Counseled.   Hyperkalemia: Resolved. Potassium levels have since remained normal   Hypothyroidism: Continue Synthroid    Acute encephalopathy: Secondary to hypoxia from COPD exacerbation. Resolved.  Consultants:  Pulmonary  Palliative medicine   Procedures:  None  Discharge Exam: BP (!) 101/49   Pulse (!) 115   Temp 97.5 F (36.4 C) (Oral)   Resp 13   Ht 5\' 10"  (1.778 m)   Wt 50 kg (110 lb 3.7 oz)   SpO2 95%   BMI 15.82 kg/m   General: Alert and oriented 3, no acute distress    Cardiovascular: Regular rate and rhythm, S1-S2  Respiratory: Decreased breath sounds throughout   Discharge Instructions You were cared for by a hospitalist during your hospital stay. If you have any questions about your discharge medications or the care you received while you were in the hospital after you are discharged, you can call the unit and asked to speak with the hospitalist on call if the hospitalist that took care of you is not available. Once you are discharged, your primary care physician will handle any further medical issues. Please note that NO REFILLS for any discharge medications will be authorized once you are discharged, as it is imperative that you return to your primary care physician (or establish a relationship with a primary care physician if you do not have one) for your aftercare needs so that they can reassess your need for medications and monitor your lab values.  Discharge Instructions    Diet - low sodium heart healthy    Complete by:  As directed    Increase activity slowly    Complete by:  As directed      Allergies as of 10/09/2016      Reactions   Levaquin [levofloxacin] Hives      Medication List    TAKE these medications   albuterol 108 (90 Base) MCG/ACT inhaler Commonly known as:  PROAIR HFA Up to 2pffs every 4 hours if can't catch your breath What changed:  how much to take  how to take this  when to take this  reasons to take this  additional instructions   albuterol (2.5 MG/3ML) 0.083% nebulizer solution Commonly known as:  PROVENTIL TAKE 3 MLS BY NEBULIZATION EVERY 4 HOURS AS NEEDED FOR WHEEZING OR SHORTNESS OF BREATH What changed:  Another medication with the same name was changed. Make sure you understand how and when to take each.   amLODipine 5 MG tablet Commonly known as:  NORVASC Take 1 tablet (5 mg total) by mouth daily. Start taking on:  10/10/2016   anastrozole 1 MG tablet Commonly known as:  ARIMIDEX Take 1 tablet (1 mg  total) by mouth daily. What changed:  See the new instructions.   diphenhydrAMINE 50 MG capsule Commonly known as:  BENADRYL Take 50 mg by mouth at bedtime.   divalproex 500 MG DR tablet Commonly known as:  DEPAKOTE Take 500 mg by mouth 2 (two) times daily.   feeding supplement (ENSURE ENLIVE) Liqd Take 237 mLs by mouth daily. Start taking on:  10/10/2016   feeding supplement Liqd Take 1 Container by mouth daily. Start taking on:  10/10/2016   FLUoxetine 20 MG capsule Commonly known as:  PROZAC Take 20 mg by mouth daily.   fluticasone 50 MCG/ACT nasal spray Commonly known as:  FLONASE Place 2 sprays into both nostrils daily. Start taking on:  10/10/2016   Glycopyrrolate-Formoterol 9-4.8 MCG/ACT Aero Commonly known as:  BEVESPI AEROSPHERE Inhale 2 puffs into the lungs 2 (two) times daily.   levothyroxine 75 MCG tablet Commonly known as:  SYNTHROID, LEVOTHROID Take 1 tablet (75 mcg total) by mouth daily.   nicotine 14 mg/24hr patch Commonly known as:  NICODERM CQ -  dosed in mg/24 hours Place 1 patch (14 mg total) onto the skin daily. Start taking on:  10/10/2016   OXYGEN Inhale 2 L into the lungs continuous.   paliperidone 9 MG 24 hr tablet Commonly known as:  INVEGA Take 9 mg by mouth at bedtime.   predniSONE 10 MG tablet Commonly known as:  DELTASONE 40 mg po daily x 1 day, then 30 mg po daily x 3 days, then 20 mg po daily x 2 days, then 10 mg po daily x 2 days What changed:  additional instructions      Allergies  Allergen Reactions  . Levaquin [Levofloxacin] Hives   Follow-up Information    Christinia Gully, MD Follow up on 10/30/2016.   Specialty:  Pulmonary Disease Why:  2:45 PM Contact information: [address] [place] [address] [phone]            The results of significant diagnostics from this hospitalization (including imaging, microbiology, ancillary and laboratory) are listed below for reference.    Significant Diagnostic  Studies: Ct Angio Chest Pe W Or Wo Contrast  Result Date: 10/01/2016 CLINICAL DATA:  Shortness of Breath EXAM: CT ANGIOGRAPHY CHEST WITH CONTRAST TECHNIQUE: Multidetector CT imaging of the chest was performed using the standard protocol during bolus administration of intravenous contrast. Multiplanar CT image reconstructions and MIPs were obtained to evaluate the vascular anatomy. CONTRAST:  100 mL Isovue 370 IV COMPARISON:  None. FINDINGS: Cardiovascular: Left arm IV contrast administration. Innominate vein and SVC patent. Right atrium and right ventricle nondilated. Satisfactory opacification of pulmonary arteries noted, and there is no evidence of pulmonary emboli. Patent bilateral pulmonary veins drain into the left atrium. Adequate contrast opacification of the thoracic aorta with no evidence of dissection, aneurysm, or stenosis. There is bovine variant brachiocephalic arch anatomy without proximal stenosis. No significant atheromatous plaque. Mediastinum/Nodes: No mediastinal hematoma. No pericardial effusion. No hilar or mediastinal adenopathy. Lungs/Pleura: No pneumothorax. No pleural effusion. Moderate hyperinflation with emphysematous changes most evident in the upper lobes. No nodule or infiltrate. Upper Abdomen: No acute abnormality. Musculoskeletal: No chest wall abnormality. No acute or significant osseous findings. Review of the MIP images confirms the above findings. IMPRESSION: 1. Negative for acute PE or thoracic aortic dissection. 2. Hyperinflation and upper lobe parenchymal changes consistent with COPD. Electronically Signed   By: Lucrezia Europe M.D.   On: 10/01/2016 14:28   Dg Chest Port 1 View  Result Date: 10/01/2016 CLINICAL DATA:  Shortness of breath, hypoxia, history asthma, COPD, RIGHT breast cancer EXAM: PORTABLE CHEST 1 VIEW COMPARISON:  Portable exam 0747 hours compared to 09/29/2016 FINDINGS: Normal heart size, mediastinal contours, and pulmonary vascularity. Lungs emphysematous  but clear. No pleural effusion or pneumothorax. LEFT breast prosthesis and RIGHT breast tissue expander. Surgical clips RIGHT axilla. Bones demineralized. IMPRESSION: COPD changes without acute infiltrate. Electronically Signed   By: Lavonia Dana M.D.   On: 10/01/2016 08:12   Dg Chest Port 1 View  Result Date: 09/29/2016 CLINICAL DATA:  Decreased oxygen saturation this morning. EXAM: PORTABLE CHEST 1 VIEW COMPARISON:  Single view chest 09/28/2016 and 07/01/2014. FINDINGS: The lungs are severely emphysematous but clear. Heart size is normal. No pneumothorax or pleural effusion. Soft tissue expander in the right breast is noted. Surgical clips the right axilla are seen. IMPRESSION: Severe emphysema without acute disease. Electronically Signed   By: Inge Rise M.D.   On: 09/29/2016 09:59   Dg Chest Port 1 View  Result Date: 09/28/2016 CLINICAL DATA:  56 year old  female with shortness of breath. EXAM: PORTABLE CHEST 1 VIEW COMPARISON:  Chest radiograph dated 08/12/2016 FINDINGS: The lungs are clear. There is no pleural effusion or pneumothorax. The cardiac silhouette is within normal limits. Right axillary and right breast surgical clips and implant. No acute osseous pathology. IMPRESSION: No active disease. Electronically Signed   By: Anner Crete M.D.   On: 09/28/2016 04:08    Microbiology: Recent Results (from the past 240 hour(s))  Culture, expectorated sputum-assessment     Status: None   Collection Time: 10/02/16 10:52 AM  Result Value Ref Range Status   Specimen Description SPUTUM  Final   Special Requests NONE  Final   Sputum evaluation THIS SPECIMEN IS ACCEPTABLE FOR SPUTUM CULTURE  Final   Report Status 10/02/2016 FINAL  Final  Culture, respiratory (NON-Expectorated)     Status: None   Collection Time: 10/02/16 10:52 AM  Result Value Ref Range Status   Specimen Description SPUTUM  Final   Special Requests NONE Reflexed from [id number]  Final   Gram Stain   Final    ABUNDANT WBC  PRESENT,BOTH PMN AND MONONUCLEAR ABUNDANT GRAM POSITIVE COCCI IN PAIRS FEW GRAM POSITIVE RODS FEW GRAM NEGATIVE RODS MODERATE YEAST RARE GRAM NEGATIVE COCCI IN PAIRS    Culture   Final    Consistent with normal respiratory flora. Performed at [facility]    Report Status 10/04/2016 FINAL  Final     Labs: Basic Metabolic Panel:  Recent Labs Lab 10/04/16 0927 10/05/16 0332 10/06/16 0430 10/07/16 0406  NA 136 139 139 141  K 3.9 4.3 4.6 4.2  CL 88* 90* 91* 92*  CO2 39* 40* 38* 42*  GLUCOSE 179* 119* 131* 180*  BUN 39* 34* 33* 26*  CREATININE 0.59 0.46 0.44 0.47  CALCIUM 9.2 9.5 9.3 9.1  MG  --  2.3 2.1 2.1  PHOS  --  3.7 3.5 4.4   Liver Function Tests: No results for input(s): AST, ALT, ALKPHOS, BILITOT, PROT, ALBUMIN in the last 168 hours. No results for input(s): LIPASE, AMYLASE in the last 168 hours. No results for input(s): AMMONIA in the last 168 hours. CBC: No results for input(s): WBC, NEUTROABS, HGB, HCT, MCV, PLT in the last 168 hours. Cardiac Enzymes: No results for input(s): CKTOTAL, CKMB, CKMBINDEX, TROPONINI in the last 168 hours. BNP: BNP (last 3 results)  Recent Labs  08/10/16 2005  BNP 84.4    ProBNP (last 3 results) No results for input(s): PROBNP in the last 8760 hours.  CBG:  Recent Labs Lab 10/07/16 2158  GLUCAP 147*       Signed:  Annita Brod, MD [facility] 10/09/2016, 1:31 PM

## 2016-10-09 NOTE — Progress Notes (Signed)
Pt is currently active with [facility] for home 02. Per RN, pt states her transport tank is empty at home. She is in need of a travel tank at DC. This CM contacted [facility] DME rep to get travel tank for pt. No other DC needs communicated. Marney Doctor RN,BSN,NCM [phone]

## 2016-10-30 ENCOUNTER — Encounter: Payer: Self-pay | Admitting: Internal Medicine

## 2016-10-30 ENCOUNTER — Telehealth: Payer: Self-pay | Admitting: Hematology

## 2016-10-30 ENCOUNTER — Ambulatory Visit (INDEPENDENT_AMBULATORY_CARE_PROVIDER_SITE_OTHER): Payer: Medicaid Other | Admitting: Internal Medicine

## 2016-10-30 VITALS — BP 102/60 | HR 83 | Ht 70.0 in | Wt 128.4 lb

## 2016-10-30 DIAGNOSIS — J449 Chronic obstructive pulmonary disease, unspecified: Secondary | ICD-10-CM | POA: Diagnosis not present

## 2016-10-30 MED ORDER — GLYCOPYRROLATE-FORMOTEROL 9-4.8 MCG/ACT IN AERO: 2.0000 | INHALATION_SPRAY | Freq: Two times a day (BID) | RESPIRATORY_TRACT | 0 refills | Status: DC

## 2016-10-30 MED ORDER — ALBUTEROL SULFATE HFA 108 (90 BASE) MCG/ACT IN AERS: INHALATION_SPRAY | RESPIRATORY_TRACT | 11 refills | Status: DC

## 2016-10-30 NOTE — Progress Notes (Signed)
Subjective:     Patient ID: Courtney Terry, female   DOB: [birth year] MRN: [id number]    Brief patient profile:  2 yobf says quit smoking 09/2016  with new sob 2014 referred to pulmonary clinic 02/26/2013 for ? Copd by [facility]hospitalists. Sees NP  Donley Redder  with GOLD IV copd by pft's 04/15/13   HPI 02/26/2013 1st pulmonary ov/ Allegra Cerniglia Chief Complaint  Patient presents with  . Acute Visit    Pt c/o increased SOB for the past wk. She states gets SOB just walking short distances such as from lobby to exam room today and also sometimes gets SOB at rest. She has also noticed increased cough and wheezing. Cough is prod with moderate yellow to clear sputum.   indolent onset, variable sob activity and at rest x 3 months better on prednisone with apparent over use of saba at baseline with more day than night time symptoms. Transiently better p saba rec The key is to stop smoking completely before smoking completely stops you!  Prednisone 10 mg take  4 each am x 2 days,   2 each am x 2 days,  1 each am x2days and stop  Plan A= automatic = dulera 200 Take 2 puffs first thing in am and then another 2 puffs about 12 hours later.  Plan B = Backup = Only use your albuterol (red inhaler = proaire) as a rescue medication     04/15/2013 f/u ov/Katy Brickell re gold iv copd/ still smoking Chief Complaint  Patient presents with  . Followup with PFT    Pt states her breathing has improved slightly since the last visit. She c/o nose bleeds on and off for the past 3 wks.    doe x greater than slow walk or > 100 ft, less saba use daytime rec  key is to stop smoking completely before smoking completely stops you!  Work on inhaler technique:     05/28/2013 f/u ov/Drake Wuertz copd G IV stop smoking around mid July Chief Complaint  Patient presents with  . COPD    Breathing has gotten worse since last OV. Has to been to the hospital. Reports her breathing is worse at night.  sob sitting still, worse lying down, x 50 ft   rec Plan A = automatic = dulera 200 2 puffs and Tudorza one puff repeat every 12 hours Plan B = backup = albuterol = proaire/ ventolin >  Only use  l as a rescue medication to be used if you can't catch your breath by resting or doing a relaxed purse lip breathing pattern. The less you use it, the better it will work when you need it. Ok to use up to 2 puffs every 4 hours Plan C = xanax (alprazolam) take 0.5 mg every 4 hours if can't get your breath after using Plan B first   06/03/13  Urine POS cocaine    08/12/2013 f/u ov/Askari Kinley still smoking  re: copd worse off tudorza  Chief Complaint  Patient presents with  . Follow-up    Pt states DOE worse for the past 3 wks. Gets SOB walking approx 100 ft. Ventolin twice daily for past few weeks  rec Plan A = automatic = dulera 200 2 puffs and Tudorza one puff repeat every 12 hours Plan B = backup = albuterol = proaire/ ventolin >  Only use  l as a rescue medication to be used if you can't catch your breath by resting or doing a relaxed purse lip breathing pattern. The  less you use it, the better it will work when you need it. Ok to use up to 2 puffs every 4 hours Plan C = xanax (alprazolam) take 0.5 mg every 4 hours if can't get your breath after using Plan B first  08/16/13 ER note admitted to ER Doctor use of crack cocaine  03/29/2016  Transition of care post hosp f/u ov/Ziyanna Tolin re: copd ? Gold IV / still smoking some/ no rx  Chief Complaint  Patient presents with  . Follow-up    Pt c/o breathing progressively worse since the last visit here in 2015. She states that she is SOB "all the time" with or without exertion.   last smoked anything 2 days ago but no longer buying any and denies elicit drug use Has no medication at all for  breathing, says "they won't give it to me s seeing you first (not true) " Has 02 just uses 2lpm at hs  [facility] = can't walk 100 yards even at a slow pace at a flat grade s stopping due to sob  eg rides scooter at grocery  store / 02 doesn't help ex tol  rec Please remember to go to the lab  department downstairs for your tests - we will call you with the results when they are available. try BEVESPI Take 2 puffs first thing in am and then another 2 puffs about 12 hours later.  Only use your albuterol(Proair) as a rescue medication  Work on inhaler technique:  relax and gently blow all the way out then take a nice smooth deep breath back in, triggering the inhaler at same time you start breathing in.  Hold for up to 5 seconds if you can. Blow out thru nose. Rinse and gargle with water when done GERD diet Please schedule a follow up office visit in 4 weeks, sooner if needed> DID NOT RETURN     Admit date: 09/28/2016 Discharge date: 10/09/2016      Recommendations for Outpatient Follow-up:  1. Patient will follow-up with Dr. Melvyn Novas, pulmonary medicine in the next 2 weeks. 2. She'll follow up with her PCP in the next one month 3. New Medication: Prednisone taper 4. New medication: Norvasc 5 mg by mouth daily  Discharge Diagnoses:       Active Hospital Problems   Diagnosis Date Noted  . Acute on chronic respiratory failure with hypoxia ([place]) 09/28/2016  . Palliative care encounter   . Goals of care, counseling/discussion   . Hypoxia   . Rhinovirus infection 09/30/2016  . Hyperkalemia 09/29/2016  . Hypothyroidism 09/29/2016  . Bipolar I disorder ([place]) 09/29/2016  . Respiratory acidosis   . Acute encephalopathy   . Cocaine abuse 08/13/2016  . Schizophrenia ([place]) 04/23/2016  . Protein-calorie malnutrition, severe 02/27/2016  . COPD exacerbation ([place]) 06/29/2014  . Cigarette smoker 02/27/2013  . COPD GOLD IV with reversibility/ still smoking 02/27/2013  . Breast cancer of upper-outer quadrant of right female breast ([place]) 01/31/2012      10/30/2016  Extended f/u ov/Pate Aylward re: transition of care, copd GOLD IV with freq admits  Chief Complaint  Patient presents with  . Hospitalization Follow-up     Pt states today is a good day for her breathing.    no decline in activity tol  since pred completed a week prior to OV   No longer smoking Use avg proair 1 pff at 4pm only / doesn't know maint vs prn or have any concept of action plan - see a/p  No obvious patterns in day to day or daytime variability or assoc excess/ purulent sputum or mucus plugs or hemoptysis or cp or chest tightness, subjective wheeze or overt sinus or hb symptoms. No unusual exp hx or h/o childhood pna/ asthma or knowledge of premature birth.  Sleeping ok without nocturnal  or early am exacerbation  of respiratory  c/o's or need for noct saba. Also denies any obvious fluctuation of symptoms with weather or environmental changes or other aggravating or alleviating factors except as outlined above   Current Medications, Allergies, Complete Past Medical History, Past Surgical History, Family History, and Social History were reviewed in [employer] record.  ROS  The following are not active complaints unless bolded sore throat, dysphagia, dental problems, itching, sneezing,  nasal congestion or excess/ purulent secretions, ear ache,   fever, chills, sweats, unintended wt loss, classically pleuritic or exertional cp,  orthopnea pnd or leg swelling, presyncope, palpitations, abdominal pain, anorexia, nausea, vomiting, diarrhea  or change in bowel or bladder habits, change in stools or urine, dysuria,hematuria,  rash, arthralgias, visual complaints, headache, numbness, weakness or ataxia or problems with walking or coordination,  change in mood/affect or memory                 Objective:   Physical Exam   04/15/2013   134  Vs 142  05/28/13 > 08/12/2013 144 > 08/26/2013  145 > 147  10/18/2013  >137 07/14/2014 > 09/05/2014 142 > 03/29/2016   110  > 10/30/2016  128  amb anxious bf nad    Vital signs reviewed  - Note on arrival 02 sats  98% on RA      HEENT mild turbinate edema bilaterally .  Oropharynx  no thrush or excess pnd or cobblestoning.  No JVD or cervical adenopathy. Mild accessory muscle hypertrophy. Trachea midline, nl thryroid. Chest was hyperinflated by percussion with diminished breath sounds bilaterally  and moderate increased exp time w/ no wheezing . Hoover sign positive at mid inspiration. Regular rate and rhythm without murmur gallop or rub or increase P2 or edema.  Abd: no hsm, nl excursion. Ext warm without cyanosis or clubbing.           I personally reviewed images and agree with radiology impression as follows:  CTa Chest 10/01/16 1. Negative for acute PE or thoracic aortic dissection. 2. Hyperinflation and upper lobe parenchymal changes consistent with COPD.

## 2016-10-30 NOTE — Patient Instructions (Addendum)
Your appt with with the cancer doctor is Jan 29th at 2:45   Work on inhaler technique:  relax and gently blow all the way out then take a nice smooth deep breath back in, triggering the inhaler at same time you start breathing in.  Hold for up to 5 seconds if you can. Blow out thru nose. Rinse and gargle with water when done  Congratulations on not smoking, it's the most important aspect of your care   Plan A = Automatic = Bevespi Take 2 puffs first thing in am and then another 2 puffs about 12 hours later.   Plan B = Backup Only use your albuterol (proair) as a rescue medication to be used if you can't catch your breath by resting or doing a relaxed purse lip breathing pattern.  - The less you use it, the better it will work when you need it. - Ok to use the inhaler up to 2 puffs  every 4 hours if you must but call for appointment if use goes up over your usual need - Don't leave home without it !!  (think of it like the spare tire for your car)   Plan C = Crisis - only use your albuterol nebulizer if you first try Plan B and it fails to help > ok to use the nebulizer up to every 4 hours but if start needing it regularly call for immediate appointment   Plan D = Doctor -  if B and C not adequate for appt right to see or Tammy NP   Please schedule a follow up office visit in 4 weeks, sooner if needed to see Tammy NP with all medications/ inhalers/ neb solutions in hand

## 2016-10-30 NOTE — Telephone Encounter (Signed)
Crystal from Dr. Para Skeans office called to reschedule missed lab/fu. Gave Crystal new appointment for lab/fu 1/29 and she will give patient appointment while patient in Dr. Para Skeans office now.

## 2016-10-31 NOTE — Assessment & Plan Note (Addendum)
-   PFT's 04/15/13 FEV1  0.56 (18%) ratio 37 and 27% better p B2  DLCO 39 corrects to 60 - added tudorza one bid 05/28/2013 > stopped 10/07/13 no change  - 05/28/2013   Walked RA x one lap @ 185 stopped due to  Sob no tachypnea with sat 93%  - 08/12/2013  Walked RA x 3 laps @ 185 ft each stopped due to end of study sats 88% - 08/26/2013   Walked RA x one lap @ 185 stopped due to sob with sat 97%  - 10/18/2013 p extensive coaching HFA effectiveness =    75% from baseline of < 25% - spirometry 10/18/2013   0.99 (36%) ratio 40  - Alpha one genotype  03/29/2016  >   MM - 03/29/2016   trial of Bevespi 2bid x 5 weeks samples > DID NOT RETURN AS REC  - Spirometry 10/30/2016  FEV1 0.69 (25%)  Ratio 39 ? p am bevespi   - 10/30/2016  After extensive coaching HFA effectiveness =    75% (short Ti)   She has stabilized s/p smoking cessation but clearly has severe dz/ high level of anxiety and extremely poor insight into meds to point where she could not acticulate any elements of the action plans we have reviewed with her in this clinic on numerous occasions   I had an extended discussion with the patient reviewing all relevant studies completed to date and  lasting 25 minutes of a 40 minute extended transition of care office visit.   Samples  = 4 weeks of Bevespi and return with all meds in hand using a trust but verify approach to confirm accurate Medication  Reconciliation The principal here is that until we are certain that the  patients are doing what we've asked, it makes no sense to ask them to do more.   Each maintenance medication was reviewed in detail including most importantly the difference between maintenance and prns and under what circumstances the prns are to be triggered using an action plan format that is not reflected in the computer generated alphabetically organized AVS.    Please see AVS for specific instructions unique to this visit that I personally wrote and verbalized to the the pt in detail  and then reviewed with pt  by my nurse highlighting any  changes in therapy recommended at today's visit to their plan of care.

## 2016-11-04 ENCOUNTER — Ambulatory Visit (HOSPITAL_BASED_OUTPATIENT_CLINIC_OR_DEPARTMENT_OTHER): Payer: Medicaid Other | Admitting: Hematology

## 2016-11-04 ENCOUNTER — Telehealth: Payer: Self-pay | Admitting: Hematology

## 2016-11-04 ENCOUNTER — Other Ambulatory Visit (HOSPITAL_BASED_OUTPATIENT_CLINIC_OR_DEPARTMENT_OTHER): Payer: Medicaid Other

## 2016-11-04 VITALS — BP 116/82 | HR 83 | Temp 98.6°F | Resp 19 | Ht 70.0 in | Wt 136.6 lb

## 2016-11-04 DIAGNOSIS — Z79811 Long term (current) use of aromatase inhibitors: Secondary | ICD-10-CM | POA: Diagnosis not present

## 2016-11-04 DIAGNOSIS — E43 Unspecified severe protein-calorie malnutrition: Secondary | ICD-10-CM

## 2016-11-04 DIAGNOSIS — F209 Schizophrenia, unspecified: Secondary | ICD-10-CM

## 2016-11-04 DIAGNOSIS — Z170 Estrogen receptor positive status [ER+]: Secondary | ICD-10-CM

## 2016-11-04 DIAGNOSIS — C50411 Malignant neoplasm of upper-outer quadrant of right female breast: Secondary | ICD-10-CM | POA: Diagnosis not present

## 2016-11-04 DIAGNOSIS — C50419 Malignant neoplasm of upper-outer quadrant of unspecified female breast: Secondary | ICD-10-CM

## 2016-11-04 DIAGNOSIS — J42 Unspecified chronic bronchitis: Secondary | ICD-10-CM

## 2016-11-04 DIAGNOSIS — Z780 Asymptomatic menopausal state: Secondary | ICD-10-CM

## 2016-11-04 LAB — COMPREHENSIVE METABOLIC PANEL
ALBUMIN: 3.4 g/dL — AB (ref 3.5–5.0)
ALT: 25 U/L (ref 0–55)
AST: 24 U/L (ref 5–34)
Alkaline Phosphatase: 119 U/L (ref 40–150)
Anion Gap: 10 mEq/L (ref 3–11)
BILIRUBIN TOTAL: 0.22 mg/dL (ref 0.20–1.20)
BUN: 13.1 mg/dL (ref 7.0–26.0)
CALCIUM: 9.2 mg/dL (ref 8.4–10.4)
CO2: 27 mEq/L (ref 22–29)
CREATININE: 0.6 mg/dL (ref 0.6–1.1)
Chloride: 105 mEq/L (ref 98–109)
EGFR: >90 mL/min/{1.73_m2} (ref >90)
Glucose: 95 mg/dl (ref 70–140)
Potassium: 4.3 mEq/L (ref 3.5–5.1)
Sodium: 142 mEq/L (ref 136–145)
TOTAL PROTEIN: 6.9 g/dL (ref 6.4–8.3)

## 2016-11-04 LAB — CBC WITH DIFFERENTIAL/PLATELET
BASO%: 0.5 % (ref 0.0–2.0)
Basophils Absolute: 0 10*3/uL (ref 0.0–0.1)
EOS%: 2.6 % (ref 0.0–7.0)
Eosinophils Absolute: 0.2 10*3/uL (ref 0.0–0.5)
HEMATOCRIT: 38.5 % (ref 34.8–46.6)
HEMOGLOBIN: 12.1 g/dL (ref 11.6–15.9)
LYMPH#: 2.6 10*3/uL (ref 0.9–3.3)
LYMPH%: 31.4 % (ref 14.0–49.7)
MCH: 26.7 pg (ref 25.1–34.0)
MCHC: 31.4 g/dL — ABNORMAL LOW (ref 31.5–36.0)
MCV: 85 fL (ref 79.5–101.0)
MONO#: 1.1 10*3/uL — AB (ref 0.1–0.9)
MONO%: 13.9 % (ref 0.0–14.0)
NEUT%: 51.6 % (ref 38.4–76.8)
NEUTROS ABS: 4.2 10*3/uL (ref 1.5–6.5)
PLATELETS: 270 10*3/uL (ref 145–400)
RBC: 4.53 10*6/uL (ref 3.70–5.45)
RDW: 17.4 % — AB (ref 11.2–14.5)
WBC: 8.1 10*3/uL (ref 3.9–10.3)

## 2016-11-04 LAB — TECHNOLOGIST REVIEW

## 2016-11-04 MED ORDER — ANASTROZOLE 1 MG PO TABS: 1.0000 mg | ORAL_TABLET | Freq: Every day | ORAL | 4 refills | Status: DC
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
## 2016-11-04 NOTE — Telephone Encounter (Signed)
Appointments scheduled per 1/29 LOS. Patient given AVS report and calendars with future scheduled appointments. °

## 2016-11-07 ENCOUNTER — Encounter: Payer: Self-pay | Admitting: Hematology

## 2016-11-28 ENCOUNTER — Ambulatory Visit: Payer: Self-pay | Admitting: Adult Health

## 2017-01-03 ENCOUNTER — Encounter (HOSPITAL_COMMUNITY): Payer: Self-pay | Admitting: Emergency Medicine

## 2017-01-03 ENCOUNTER — Encounter (HOSPITAL_COMMUNITY): Payer: Self-pay | Admitting: *Deleted

## 2017-01-03 NOTE — Progress Notes (Signed)
Anesthesia Chart Review:  Pt is a same day work up.   Pt is a 56 year old female scheduled for removal of R tissue expander with placement of R breast implant on 01/06/2017 with Frederick Peers, MD.   - Pulmonologist is Christinia Gully, MD, last office visit 10/30/16.   PMH includes:  HTN, epilepsy, severe stage IV COPD, asthma, anemia, bipolar disorder, PTSD, breast cancer, hypothyroidism.  Uses oxygen, unclear from notes if prn or continuously. Former smoker. BMI 19.5. S/p R mastectomy 03/14/15 and six subsequent surgeries for reconstruction, last 03/08/16  Hospitalized 09/28/16-10/09/16 for acute on chronic respiratory failure with hypoxia due to rhinovirus, was in ICU on BiPAP; prognosis felt to be poor, pt elected to be DNR 10/05/17. Hospice recommended. DNR status was inactivated when pt discharged from hospital.   - PAT RN spoke with pt; pt denies ever electing DNR, thinks someone must have documented on wrong patient.   Medications include: Albuterol, amlodipine, Arimidex, Depakote, bevespi aerosphere, levothyroxine, invega  Labs will be obtained DOS.   CT angio chest 10/01/16:  1. Negative for acute PE or thoracic aortic dissection. 2. Hyperinflation and upper lobe parenchymal changes consistent with COPD.  EKG 09/28/16: Sinus tachycardia (113 bpm). Multiform PVCs. Biatrial enlargement. Anterolateral infarct, old. Interpretation limited secondary to artifact  Echo 08/14/16:  - Left ventricle: The cavity size was normal. Systolic function was normal. The estimated ejection fraction was in the range of 55% to 60%. Wall motion was normal; there were no regional wall motion abnormalities. Doppler parameters are consistent with abnormal left ventricular relaxation (grade 1 diastolic dysfunction). Doppler parameters are consistent with indeterminate ventricular filling pressure. - Aortic valve: Transvalvular velocity was within the normal range. There was no stenosis. There was no regurgitation. -  Mitral valve: Transvalvular velocity was within the normal range. There was no evidence for stenosis. There was no regurgitation. - Right ventricle: The cavity size was normal. Wall thickness was normal. Systolic function was normal. - Tricuspid valve: There was trivial regurgitation. - Pulmonary arteries: Systolic pressure was within the normal range. PA peak pressure: 32 mm Hg (S).  Pt will need further assessment by assigned anesthesiologist DOS. If no acute respiratory issues and labs acceptable, I anticipate pt can proceed as scheduled.   Willeen Cass, FNP-BC [facility]/Anesthesiology Phone: [phone] 01/03/2017 3:34 PM

## 2017-01-06 ENCOUNTER — Ambulatory Visit (HOSPITAL_COMMUNITY): Admission: RE | Admit: 2017-01-06 | Payer: Medicaid Other | Source: Ambulatory Visit | Admitting: Plastic Surgery

## 2017-01-06 HISTORY — DX: Nocturia: R35.1

## 2017-01-06 HISTORY — DX: Essential (primary) hypertension: I10

## 2017-01-06 SURGERY — REMOVAL, TISSUE EXPANDER, BREAST, BILATERAL, WITH BILATERAL IMPLANT IMPLANT INSERTION
Anesthesia: General | Laterality: Right

## 2017-01-23 ENCOUNTER — Encounter (HOSPITAL_COMMUNITY): Payer: Self-pay | Admitting: *Deleted

## 2017-01-23 NOTE — Progress Notes (Signed)
Pt denies SOB, chest pain, and being under the care of a cardiologist. Pt denies having a stress test and cardiac cath. Pt made aware to stop taking  Aspirin, vitamins, fish oil and herbal medications. Do not take any NSAIDs ie: Ibuprofen, Advil, Naproxen, BC and Goody Powder or any medication containing Aspirin. Pt verbalized understanding of all pre-op instructions.

## 2017-01-24 ENCOUNTER — Ambulatory Visit (HOSPITAL_COMMUNITY): Payer: Medicaid Other | Admitting: Anesthesiology

## 2017-01-24 ENCOUNTER — Encounter (HOSPITAL_COMMUNITY)
Admission: RE | Disposition: A | Discharge status: Home / Self Care | Payer: Self-pay | Source: Ambulatory Visit | Attending: Plastic Surgery

## 2017-01-24 ENCOUNTER — Encounter (HOSPITAL_COMMUNITY): Payer: Self-pay | Admitting: Urology

## 2017-01-24 ENCOUNTER — Observation Stay (HOSPITAL_COMMUNITY)
Admission: RE | Admit: 2017-01-24 | Discharge: 2017-01-25 | Disposition: A | Discharge status: Home / Self Care | Payer: Medicaid Other | Source: Ambulatory Visit | Attending: Plastic Surgery | Admitting: Plastic Surgery

## 2017-01-24 DIAGNOSIS — Z9011 Acquired absence of right breast and nipple: Secondary | ICD-10-CM | POA: Insufficient documentation

## 2017-01-24 DIAGNOSIS — R569 Unspecified convulsions: Secondary | ICD-10-CM | POA: Diagnosis not present

## 2017-01-24 DIAGNOSIS — F419 Anxiety disorder, unspecified: Secondary | ICD-10-CM | POA: Insufficient documentation

## 2017-01-24 DIAGNOSIS — E039 Hypothyroidism, unspecified: Secondary | ICD-10-CM | POA: Diagnosis not present

## 2017-01-24 DIAGNOSIS — Z681 Body mass index (BMI) 19 or less, adult: Secondary | ICD-10-CM | POA: Insufficient documentation

## 2017-01-24 DIAGNOSIS — I10 Essential (primary) hypertension: Secondary | ICD-10-CM | POA: Diagnosis not present

## 2017-01-24 DIAGNOSIS — F209 Schizophrenia, unspecified: Secondary | ICD-10-CM | POA: Insufficient documentation

## 2017-01-24 DIAGNOSIS — F319 Bipolar disorder, unspecified: Secondary | ICD-10-CM | POA: Insufficient documentation

## 2017-01-24 DIAGNOSIS — Z421 Encounter for breast reconstruction following mastectomy: Secondary | ICD-10-CM | POA: Diagnosis present

## 2017-01-24 DIAGNOSIS — Z853 Personal history of malignant neoplasm of breast: Secondary | ICD-10-CM | POA: Diagnosis not present

## 2017-01-24 DIAGNOSIS — Z87891 Personal history of nicotine dependence: Secondary | ICD-10-CM | POA: Insufficient documentation

## 2017-01-24 DIAGNOSIS — E46 Unspecified protein-calorie malnutrition: Secondary | ICD-10-CM | POA: Diagnosis not present

## 2017-01-24 DIAGNOSIS — Z79811 Long term (current) use of aromatase inhibitors: Secondary | ICD-10-CM | POA: Insufficient documentation

## 2017-01-24 DIAGNOSIS — J449 Chronic obstructive pulmonary disease, unspecified: Secondary | ICD-10-CM | POA: Insufficient documentation

## 2017-01-24 DIAGNOSIS — Z9981 Dependence on supplemental oxygen: Secondary | ICD-10-CM | POA: Diagnosis not present

## 2017-01-24 HISTORY — PX: REMOVAL OF BILATERAL TISSUE EXPANDERS WITH PLACEMENT OF BILATERAL BREAST IMPLANTS: SHX6431

## 2017-01-24 HISTORY — PX: TISSUE EXPANDER  REMOVAL W/ REPLACEMENT OF IMPLANT: SHX2529

## 2017-01-24 LAB — BASIC METABOLIC PANEL
ANION GAP: 9 (ref 5–15)
BUN: 10 mg/dL (ref 6–20)
CALCIUM: 9.1 mg/dL (ref 8.9–10.3)
CO2: 27 mmol/L (ref 22–32)
Chloride: 102 mmol/L (ref 101–111)
Creatinine, Ser: 0.52 mg/dL (ref 0.44–1.00)
GFR calc Af Amer: >60 mL/min (ref >60)
GFR calc non Af Amer: >60 mL/min (ref >60)
GLUCOSE: 96 mg/dL (ref 65–99)
Potassium: 3.3 mmol/L — ABNORMAL LOW (ref 3.5–5.1)
Sodium: 138 mmol/L (ref 135–145)

## 2017-01-24 LAB — CBC
HCT: 42.6 % (ref 36.0–46.0)
HEMOGLOBIN: 13.5 g/dL (ref 12.0–15.0)
MCH: 27.1 pg (ref 26.0–34.0)
MCHC: 31.7 g/dL (ref 30.0–36.0)
MCV: 85.5 fL (ref 78.0–100.0)
Platelets: 257 10*3/uL (ref 150–400)
RBC: 4.98 MIL/uL (ref 3.87–5.11)
RDW: 16.2 % — ABNORMAL HIGH (ref 11.5–15.5)
WBC: 9.5 10*3/uL (ref 4.0–10.5)

## 2017-01-24 SURGERY — REMOVAL, TISSUE EXPANDER, BREAST, BILATERAL, WITH BILATERAL IMPLANT IMPLANT INSERTION
Anesthesia: General | Site: Breast | Laterality: Right

## 2017-01-24 MED ORDER — DIVALPROEX SODIUM 500 MG PO DR TAB
500.0000 mg | DELAYED_RELEASE_TABLET | Freq: Two times a day (BID) | ORAL | Status: DC
  Administered 2017-01-24 – 2017-01-25 (×2): 500 mg via ORAL
  Filled 2017-01-24 (×2): qty 1

## 2017-01-24 MED ORDER — DOXYCYCLINE HYCLATE 50 MG PO CAPS: 100.0000 mg | ORAL_CAPSULE | Freq: Two times a day (BID) | ORAL | 0 refills | Status: DC

## 2017-01-24 MED ORDER — CHLORHEXIDINE GLUCONATE CLOTH 2 % EX PADS: 6.0000 | MEDICATED_PAD | Freq: Once | CUTANEOUS | Status: DC

## 2017-01-24 MED ORDER — VANCOMYCIN HCL IN DEXTROSE 1-5 GM/200ML-% IV SOLN
1000.0000 mg | INTRAVENOUS | Status: AC
  Administered 2017-01-24: 1000 mg via INTRAVENOUS

## 2017-01-24 MED ORDER — DIPHENHYDRAMINE HCL 25 MG PO CAPS: 50.0000 mg | ORAL_CAPSULE | Freq: Every evening | ORAL | Status: DC | PRN

## 2017-01-24 MED ORDER — OXYCODONE HCL 5 MG/5ML PO SOLN: 5.0000 mg | Freq: Once | ORAL | Status: DC | PRN

## 2017-01-24 MED ORDER — LIDOCAINE 2% (20 MG/ML) 5 ML SYRINGE
INTRAMUSCULAR | Status: AC
  Filled 2017-01-24: qty 5

## 2017-01-24 MED ORDER — HYDROMORPHONE HCL 1 MG/ML IJ SOLN: 0.2500 mg | INTRAMUSCULAR | Status: DC | PRN

## 2017-01-24 MED ORDER — ALBUTEROL SULFATE (2.5 MG/3ML) 0.083% IN NEBU: 3.0000 mL | INHALATION_SOLUTION | RESPIRATORY_TRACT | Status: DC | PRN

## 2017-01-24 MED ORDER — MIDAZOLAM HCL 2 MG/2ML IJ SOLN
INTRAMUSCULAR | Status: AC
  Filled 2017-01-24: qty 2

## 2017-01-24 MED ORDER — ONDANSETRON HCL 4 MG/2ML IJ SOLN: 4.0000 mg | Freq: Four times a day (QID) | INTRAMUSCULAR | Status: DC | PRN

## 2017-01-24 MED ORDER — SUGAMMADEX SODIUM 200 MG/2ML IV SOLN
INTRAVENOUS | Status: AC
  Filled 2017-01-24: qty 2

## 2017-01-24 MED ORDER — ALBUTEROL SULFATE (2.5 MG/3ML) 0.083% IN NEBU: 2.5000 mg | INHALATION_SOLUTION | RESPIRATORY_TRACT | Status: DC | PRN

## 2017-01-24 MED ORDER — GABAPENTIN 300 MG PO CAPS
300.0000 mg | ORAL_CAPSULE | ORAL | Status: AC
  Administered 2017-01-24: 300 mg via ORAL
  Filled 2017-01-24: qty 1

## 2017-01-24 MED ORDER — ONDANSETRON HCL 4 MG/2ML IJ SOLN
INTRAMUSCULAR | Status: DC | PRN
  Administered 2017-01-24: 4 mg via INTRAVENOUS

## 2017-01-24 MED ORDER — PROMETHAZINE HCL 25 MG/ML IJ SOLN: 6.2500 mg | INTRAMUSCULAR | Status: DC | PRN

## 2017-01-24 MED ORDER — OXYCODONE-ACETAMINOPHEN 10-325 MG PO TABS: 1.0000 | ORAL_TABLET | ORAL | 0 refills | Status: DC | PRN

## 2017-01-24 MED ORDER — FENTANYL CITRATE (PF) 250 MCG/5ML IJ SOLN
INTRAMUSCULAR | Status: AC
  Filled 2017-01-24: qty 5

## 2017-01-24 MED ORDER — CELECOXIB 200 MG PO CAPS
400.0000 mg | ORAL_CAPSULE | ORAL | Status: AC
  Administered 2017-01-24: 400 mg via ORAL
  Filled 2017-01-24: qty 2

## 2017-01-24 MED ORDER — ANASTROZOLE 1 MG PO TABS
1.0000 mg | ORAL_TABLET | Freq: Every day | ORAL | Status: DC
  Administered 2017-01-24 – 2017-01-25 (×2): 1 mg via ORAL
  Filled 2017-01-24 (×2): qty 1

## 2017-01-24 MED ORDER — PROPOFOL 10 MG/ML IV BOLUS
INTRAVENOUS | Status: DC | PRN
  Administered 2017-01-24: 130 mg via INTRAVENOUS

## 2017-01-24 MED ORDER — OXYCODONE HCL 5 MG PO TABS: 5.0000 mg | ORAL_TABLET | Freq: Once | ORAL | Status: DC | PRN

## 2017-01-24 MED ORDER — LACTATED RINGERS IV SOLN
INTRAVENOUS | Status: DC
  Administered 2017-01-24: 11:00:00 via INTRAVENOUS

## 2017-01-24 MED ORDER — DIPHENHYDRAMINE HCL 50 MG/ML IJ SOLN
INTRAMUSCULAR | Status: AC
  Filled 2017-01-24: qty 1

## 2017-01-24 MED ORDER — ONDANSETRON HCL 4 MG/2ML IJ SOLN
INTRAMUSCULAR | Status: AC
  Filled 2017-01-24: qty 2

## 2017-01-24 MED ORDER — ACETAMINOPHEN 500 MG PO TABS
1000.0000 mg | ORAL_TABLET | ORAL | Status: AC
  Administered 2017-01-24: 1000 mg via ORAL
  Filled 2017-01-24: qty 2

## 2017-01-24 MED ORDER — 0.9 % SODIUM CHLORIDE (POUR BTL) OPTIME
TOPICAL | Status: DC | PRN
  Administered 2017-01-24: 1000 mL

## 2017-01-24 MED ORDER — VANCOMYCIN HCL IN DEXTROSE 1-5 GM/200ML-% IV SOLN
1000.0000 mg | Freq: Two times a day (BID) | INTRAVENOUS | Status: AC
  Administered 2017-01-25: 1000 mg via INTRAVENOUS
  Filled 2017-01-24: qty 200

## 2017-01-24 MED ORDER — LIDOCAINE HCL (CARDIAC) 20 MG/ML IV SOLN
INTRAVENOUS | Status: DC | PRN
  Administered 2017-01-24: 60 mg via INTRAVENOUS

## 2017-01-24 MED ORDER — FLUTICASONE PROPIONATE 50 MCG/ACT NA SUSP: 2.0000 | Freq: Every day | NASAL | Status: DC

## 2017-01-24 MED ORDER — AMLODIPINE BESYLATE 5 MG PO TABS
5.0000 mg | ORAL_TABLET | Freq: Every day | ORAL | Status: DC
  Administered 2017-01-24 – 2017-01-25 (×2): 5 mg via ORAL
  Filled 2017-01-24 (×2): qty 1

## 2017-01-24 MED ORDER — SUGAMMADEX SODIUM 200 MG/2ML IV SOLN
INTRAVENOUS | Status: DC | PRN
  Administered 2017-01-24: 120 mg via INTRAVENOUS

## 2017-01-24 MED ORDER — FENTANYL CITRATE (PF) 100 MCG/2ML IJ SOLN
INTRAMUSCULAR | Status: DC | PRN
  Administered 2017-01-24: 100 ug via INTRAVENOUS

## 2017-01-24 MED ORDER — PHENYLEPHRINE HCL 10 MG/ML IJ SOLN
INTRAVENOUS | Status: DC | PRN
  Administered 2017-01-24: 50 ug/min via INTRAVENOUS

## 2017-01-24 MED ORDER — VANCOMYCIN HCL IN DEXTROSE 1-5 GM/200ML-% IV SOLN
INTRAVENOUS | Status: AC
  Filled 2017-01-24: qty 200

## 2017-01-24 MED ORDER — LEVOTHYROXINE SODIUM 75 MCG PO TABS
75.0000 ug | ORAL_TABLET | Freq: Every day | ORAL | Status: DC
  Administered 2017-01-24 – 2017-01-25 (×2): 75 ug via ORAL
  Filled 2017-01-24 (×2): qty 1

## 2017-01-24 MED ORDER — PHENYLEPHRINE 40 MCG/ML (10ML) SYRINGE FOR IV PUSH (FOR BLOOD PRESSURE SUPPORT)
PREFILLED_SYRINGE | INTRAVENOUS | Status: AC
  Filled 2017-01-24: qty 10

## 2017-01-24 MED ORDER — SODIUM CHLORIDE 0.9 % IV SOLN
Freq: Once | INTRAVENOUS | Status: AC
  Administered 2017-01-24: 13:00:00
  Filled 2017-01-24: qty 1

## 2017-01-24 MED ORDER — PALIPERIDONE ER 6 MG PO TB24
9.0000 mg | ORAL_TABLET | Freq: Every day | ORAL | Status: DC
  Administered 2017-01-24: 9 mg via ORAL
  Filled 2017-01-24: qty 1

## 2017-01-24 MED ORDER — ONDANSETRON 4 MG PO TBDP: 4.0000 mg | ORAL_TABLET | Freq: Four times a day (QID) | ORAL | Status: DC | PRN

## 2017-01-24 MED ORDER — OXYCODONE-ACETAMINOPHEN 5-325 MG PO TABS
1.0000 | ORAL_TABLET | ORAL | Status: DC | PRN
  Administered 2017-01-24 – 2017-01-25 (×4): 2 via ORAL
  Filled 2017-01-24 (×4): qty 2

## 2017-01-24 MED ORDER — PHENYLEPHRINE HCL 10 MG/ML IJ SOLN
INTRAMUSCULAR | Status: DC | PRN
  Administered 2017-01-24 (×3): 80 ug via INTRAVENOUS

## 2017-01-24 MED ORDER — PROPOFOL 10 MG/ML IV BOLUS
INTRAVENOUS | Status: AC
  Filled 2017-01-24: qty 20

## 2017-01-24 MED ORDER — MIDAZOLAM HCL 5 MG/5ML IJ SOLN
INTRAMUSCULAR | Status: DC | PRN
  Administered 2017-01-24: 2 mg via INTRAVENOUS

## 2017-01-24 MED ORDER — KCL IN DEXTROSE-NACL 20-5-0.45 MEQ/L-%-% IV SOLN
INTRAVENOUS | Status: DC
  Administered 2017-01-24: 16:00:00 via INTRAVENOUS
  Filled 2017-01-24 (×2): qty 1000

## 2017-01-24 MED ORDER — ROCURONIUM BROMIDE 10 MG/ML (PF) SYRINGE
PREFILLED_SYRINGE | INTRAVENOUS | Status: AC
  Filled 2017-01-24: qty 5

## 2017-01-24 MED ORDER — DEXAMETHASONE SODIUM PHOSPHATE 10 MG/ML IJ SOLN
INTRAMUSCULAR | Status: DC | PRN
  Administered 2017-01-24: 10 mg via INTRAVENOUS

## 2017-01-24 MED ORDER — ROCURONIUM BROMIDE 100 MG/10ML IV SOLN
INTRAVENOUS | Status: DC | PRN
  Administered 2017-01-24: 40 mg via INTRAVENOUS
  Administered 2017-01-24: 10 mg via INTRAVENOUS

## 2017-01-24 MED ORDER — MEPERIDINE HCL 25 MG/ML IJ SOLN: 6.2500 mg | INTRAMUSCULAR | Status: DC | PRN

## 2017-01-24 MED ORDER — BOOST / RESOURCE BREEZE PO LIQD: 1.0000 | ORAL | Status: DC

## 2017-01-24 MED ORDER — DEXAMETHASONE SODIUM PHOSPHATE 10 MG/ML IJ SOLN
INTRAMUSCULAR | Status: AC
  Filled 2017-01-24: qty 1

## 2017-01-24 MED ORDER — FLUOXETINE HCL 20 MG PO CAPS
20.0000 mg | ORAL_CAPSULE | Freq: Every day | ORAL | Status: DC
  Administered 2017-01-24 – 2017-01-25 (×2): 20 mg via ORAL
  Filled 2017-01-24 (×2): qty 1

## 2017-01-24 SURGICAL SUPPLY — 66 items
ADH SKN CLS APL DERMABOND .7 (GAUZE/BANDAGES/DRESSINGS) ×2
BAG DECANTER FOR FLEXI CONT (MISCELLANEOUS) ×2 IMPLANT
BINDER BREAST LRG (GAUZE/BANDAGES/DRESSINGS) ×1 IMPLANT
BINDER BREAST XLRG (GAUZE/BANDAGES/DRESSINGS) IMPLANT
BLADE SURG 10 STRL SS (BLADE) ×1 IMPLANT
BNDG GAUZE ELAST 4 BULKY (GAUZE/BANDAGES/DRESSINGS) ×2 IMPLANT
CANISTER SUCT 3000ML PPV (MISCELLANEOUS) ×2 IMPLANT
CHLORAPREP W/TINT 26ML (MISCELLANEOUS) ×2 IMPLANT
CLSR STERI-STRIP ANTIMIC 1/2X4 (GAUZE/BANDAGES/DRESSINGS) ×2 IMPLANT
COVER SURGICAL LIGHT HANDLE (MISCELLANEOUS) ×2 IMPLANT
DERMABOND ADVANCED (GAUZE/BANDAGES/DRESSINGS) ×2
DERMABOND ADVANCED .7 DNX12 (GAUZE/BANDAGES/DRESSINGS) ×2 IMPLANT
DRAIN CHANNEL 15F RND FF W/TCR (WOUND CARE) IMPLANT
DRAPE HALF SHEET 40X57 (DRAPES) IMPLANT
DRAPE INCISE IOBAN 66X45 STRL (DRAPES) IMPLANT
DRAPE ORTHO SPLIT 77X108 STRL (DRAPES) ×4
DRAPE SURG ORHT 6 SPLT 77X108 (DRAPES) ×2 IMPLANT
DRAPE WARM FLUID 44X44 (DRAPE) ×2 IMPLANT
DRSG PAD ABDOMINAL 8X10 ST (GAUZE/BANDAGES/DRESSINGS) ×4 IMPLANT
DRSG TEGADERM 2-3/8X2-3/4 SM (GAUZE/BANDAGES/DRESSINGS) ×3 IMPLANT
DRSG TEGADERM 4X4.75 (GAUZE/BANDAGES/DRESSINGS) ×8 IMPLANT
ELECT BLADE 4.0 EZ CLEAN MEGAD (MISCELLANEOUS) ×2
ELECT CAUTERY BLADE 6.4 (BLADE) ×2 IMPLANT
ELECT COATED BLADE 2.86 ST (ELECTRODE) ×2 IMPLANT
ELECT REM PT RETURN 9FT ADLT (ELECTROSURGICAL) ×2
ELECTRODE BLDE 4.0 EZ CLN MEGD (MISCELLANEOUS) ×1 IMPLANT
ELECTRODE REM PT RTRN 9FT ADLT (ELECTROSURGICAL) ×1 IMPLANT
EVACUATOR SILICONE 100CC (DRAIN) IMPLANT
GAUZE SPONGE 4X4 12PLY STRL (GAUZE/BANDAGES/DRESSINGS) ×2 IMPLANT
GLOVE BIO SURGEON STRL SZ 6 (GLOVE) ×6 IMPLANT
GOWN STRL REUS W/ TWL LRG LVL3 (GOWN DISPOSABLE) ×2 IMPLANT
GOWN STRL REUS W/TWL LRG LVL3 (GOWN DISPOSABLE) ×4
IMPL BREAST SALINE MP 225CC (Breast) IMPLANT
IMPLANT BREAST SALINE MP 225CC (Breast) ×2 IMPLANT
KIT BASIN OR (CUSTOM PROCEDURE TRAY) ×2 IMPLANT
KIT FILL SYSTEM UNIVERSAL (SET/KITS/TRAYS/PACK) ×1 IMPLANT
KIT ROOM TURNOVER OR (KITS) ×2 IMPLANT
MARKER SKIN DUAL TIP RULER LAB (MISCELLANEOUS) ×2 IMPLANT
NS IRRIG 1000ML POUR BTL (IV SOLUTION) ×4 IMPLANT
PACK GENERAL/GYN (CUSTOM PROCEDURE TRAY) ×2 IMPLANT
PAD ABD 8X10 STRL (GAUZE/BANDAGES/DRESSINGS) ×2 IMPLANT
PAD ARMBOARD 7.5X6 YLW CONV (MISCELLANEOUS) ×2 IMPLANT
PIN SAFETY STERILE (MISCELLANEOUS) ×2 IMPLANT
SET ASEPTIC TRANSFER (MISCELLANEOUS) ×2 IMPLANT
SIZER BREAST 225CC (SIZER) ×2
SIZER BRST 225CC (SIZER) IMPLANT
SOL PREP POV-IOD 4OZ 10% (MISCELLANEOUS) ×2 IMPLANT
STAPLER VISISTAT 35W (STAPLE) ×2 IMPLANT
SUT CHROMIC 4 0 PS 2 18 (SUTURE) IMPLANT
SUT ETHILON 2 0 FS 18 (SUTURE) ×4 IMPLANT
SUT MNCRL 3 0 RB1 (SUTURE) IMPLANT
SUT MNCRL AB 4-0 PS2 18 (SUTURE) ×1 IMPLANT
SUT MONOCRYL 3 0 RB1 (SUTURE)
SUT VIC AB 0 CT1 27 (SUTURE)
SUT VIC AB 0 CT1 27XBRD ANBCTR (SUTURE) IMPLANT
SUT VIC AB 3-0 SH 27 (SUTURE) ×2
SUT VIC AB 3-0 SH 27X BRD (SUTURE) IMPLANT
SUT VICRYL 3 0 (SUTURE) IMPLANT
SUT VICRYL 4-0 PS2 18IN ABS (SUTURE) ×1 IMPLANT
SUT VLOC 180 0 24IN GS25 (SUTURE) IMPLANT
SYR BULB IRRIGATION 50ML (SYRINGE) ×2 IMPLANT
TAPE STRIPS DRAPE STRL (GAUZE/BANDAGES/DRESSINGS) ×2 IMPLANT
TOWEL OR 17X24 6PK STRL BLUE (TOWEL DISPOSABLE) ×2 IMPLANT
TOWEL OR 17X26 10 PK STRL BLUE (TOWEL DISPOSABLE) ×2 IMPLANT
TRAY FOLEY W/METER SILVER 16FR (SET/KITS/TRAYS/PACK) IMPLANT
TUBE CONNECTING 12X1/4 (SUCTIONS) ×2 IMPLANT

## 2017-01-24 NOTE — Anesthesia Postprocedure Evaluation (Signed)
Anesthesia Post Note  Patient: Courtney Terry  Procedure(s) Performed: Procedure(s) (LRB): REMOVAL OF RIGHT  TISSUE EXPANDERS WITH PLACEMENT OF RIGHT BREAST IMPLANT (Right)  Patient location during evaluation: PACU Anesthesia Type: General Level of consciousness: awake and alert Pain management: pain level controlled Vital Signs Assessment: post-procedure vital signs reviewed and stable Respiratory status: spontaneous breathing, nonlabored ventilation and respiratory function stable Cardiovascular status: blood pressure returned to baseline and stable Postop Assessment: no signs of nausea or vomiting Anesthetic complications: no       Last Vitals:  Vitals:   01/24/17 1515 01/24/17 1546  BP: 117/80 107/75  Pulse: 68 67  Resp: (!) 9   Temp:  36.5 C    Last Pain:  Vitals:   01/24/17 1546  TempSrc: Oral                 Rajat Staver,W. EDMOND

## 2017-01-24 NOTE — Anesthesia Procedure Notes (Signed)
Procedure Name: Intubation Date/Time: 01/24/2017 1:16 PM Performed by: Candis Shine Pre-anesthesia Checklist: Patient identified, Emergency Drugs available, Suction available and Patient being monitored Patient Re-evaluated:Patient Re-evaluated prior to inductionOxygen Delivery Method: Circle System Utilized Preoxygenation: Pre-oxygenation with 100% oxygen Intubation Type: IV induction Ventilation: Mask ventilation without difficulty Laryngoscope Size: Mac and 3 Grade View: Grade I Tube type: Oral Tube size: 7.0 mm Number of attempts: 1 Airway Equipment and Method: Stylet Placement Confirmation: ETT inserted through vocal cords under direct vision,  positive ETCO2 and breath sounds checked- equal and bilateral Secured at: 22 cm Tube secured with: Tape Dental Injury: Teeth and Oropharynx as per pre-operative assessment  Comments: Intubation by Gerald Stabs, EMT student

## 2017-01-24 NOTE — Transfer of Care (Signed)
Immediate Anesthesia Transfer of Care Note  Patient: Courtney Terry  Procedure(s) Performed: Procedure(s): REMOVAL OF RIGHT  TISSUE EXPANDERS WITH PLACEMENT OF RIGHT BREAST IMPLANT (Right)  Patient Location: PACU  Anesthesia Type:General  Level of Consciousness: awake and alert   Airway & Oxygen Therapy: Patient Spontanous Breathing and Patient connected to nasal cannula oxygen  Post-op Assessment: Report given to RN and Post -op Vital signs reviewed and stable  Post vital signs: Reviewed and stable  Last Vitals:  Vitals:   01/24/17 1012  BP: 125/89  Pulse: 85  Resp: 20  Temp: 37.1 C    Last Pain:  Vitals:   01/24/17 1012  TempSrc: Oral      Patients Stated Pain Goal: 3 (29/02/11 1552)  Complications: No apparent anesthesia complications

## 2017-01-24 NOTE — H&P (Signed)
   10 months post op right latissimus and TE placement and here for implant exchange. [name] Kitchen  History significant for right NSM and TE reconstruction. Following implant exchange and left augmentation for symmetry, course complicated by gross exposure right breast implant and left breast MRSA infection, and underwent bilateral explant.   Represented with palpable mass that has been growing for 6 months. MMG/US 9 mm palpable mass in the 10 o'clock position of the right breast, 2 cm from the nipple, 6 mm intramammary node or cyst in the 10 o'clock position of the right breast, 1 cm from the nipple, No right axillary adenopathy. ER+/PR- Her 2 -. 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 Nodes negative. Oncotype 30 r. Burr Medico did not strongly recommend chemotherapy given pshycological issues and patient declined chemotherapy as well. On AI. Last MMG left 04/2016 benign.  History significant for diagnosis of right breast cancer 2013 and underwent SLN/lumpectomy. 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 ER+/PR- Her 2 neg. Oncotype score 25 at that time. Patient also referred to genetics and Rad Onc at that time. Patient states she was never told she needed radiation, also does not remember results of genetics test. Chart review states done at [facility] and was negative (no panel specified).   Prior 34 B. Mastectomy weight 113 g Last MMG 04/2016 normal.  COPD. Followed by [facility] for this. +Bipolar d/o, managed by PCP.  Objective:  Physical Exam  Cardiovascular: Normal rate, regular rhythm and normal heart sounds.  Pulmonary/Chest: Effort normal and breath sounds normal.   Chest: scars maturing, breast soft, Sn to nipple R 18 L 20  BW R 13 L 13  Nipple to IMF R 8 L 8 some waterfall of left breast   Back:flat, scar soft  Assessment:   Right breast cancer - recurrent  Protein calorie malnutrition S/p NSM, TE reconstruction (serratus fascia/subpectoral) S/p implant  exchange right, left augmentation with saline S/p removal bilateral breast implants, MRSA culture from left breast cavity S/p re placement bilateral saline implants S/p implant exchange right for exposure S/p removal right breast implant S/p right latissimus, TE reconstruction   Plan:   Plan implant exchange on right. Discussed left mastopexy, we decided to do implant exchange alone for now and see how we do. Given her O2 use at night, plan observation stay and will require main OR.  Mentor Siltex Moderate Profile Saline implant. LEFT 175 ml, fill volume 175 ml.   Natrelle [id number]-11-T 250 ml tissue expander placed,  initial fill volume 230 ml    Courtney Limbo, MD [facility] [phone], pin [phone]

## 2017-01-24 NOTE — Op Note (Signed)
Operative Note   DATE OF OPERATION: 4.20.2018  LOCATION: [facility] OR-observation  SURGICAL DIVISION: Plastic Surgery  PREOPERATIVE DIAGNOSES:  1. History right breast cancer 2. Acquired absence right breast  POSTOPERATIVE DIAGNOSES:  same  PROCEDURE:  Removal right tissue expander and placement saline implant  SURGEON: Irene Limbo MD MBA  ASSISTANT: none  ANESTHESIA:  General.   EBL: 10 ml  COMPLICATIONS: None immediate.   INDICATIONS FOR PROCEDURE:  The patient, Courtney Terry, is a 56 y.o. female born on 10-Apr-1961, is here for implant exchange following right latissimus flap with expander placement. Patient has had prior nipple sparing mastectomy.   FINDINGS: Mentor Smooth Round Moderate Profile Saline 225 ml implant placed, fill volume 250 ml. REF [id number] SN [id number]  DESCRIPTION OF PROCEDURE:  The patient's operative site was marked with the patient in the preoperative area. The patient was taken to the operating room. SCDs were placed and IV antibiotics were given. The patient's operative site was prepped and draped in a sterile fashion. A time out was performed and all information was confirmed to be correct. Tegaderm shield placed over left NAC.Incision made at caudal inset of latissimus flap skin paddle. Mastectomy flap elevated off of muscle inferiorly in limited fashion. Latissimus muscle divided in direction of fibers. Expander removed. Capsulotomy performed medially and superomedially. Sizer placed and patient assessed in sitting position. Patient returned to supine position and cavity irrigated with saline solution containing Ancef, gentamicin, and bacitracin. Hemostasis ensured. Cavity irrigated with Betadine. Implant prepared and placed in pocket. Implant filled to 250 ml and fill tubing removed. Tab closure and orientation implant ensured. Closure completed with 3-0 vicryl to approximate latissimus muscle followed by 4-0 vicryl in dermis, 4-0 monocryl  subcuticular for skin closure. Tissue adhesive applied. Dry dressing and breast binder applied.   The patient was allowed to wake from anesthesia, extubated and taken to the recovery room in satisfactory condition.   SPECIMENS: none  DRAINS: none  Irene Limbo, MD [facility] Surgery [phone], pin [phone]

## 2017-01-24 NOTE — Anesthesia Preprocedure Evaluation (Signed)
Anesthesia Evaluation  Patient identified by MRN, date of birth, ID band Patient awake    Reviewed: Allergy & Precautions, H&P , NPO status , Patient's Chart, lab work & pertinent test results  Airway Mallampati: II  TM Distance: >3 FB Neck ROM: Full    Dental no notable dental hx. (+) Upper Dentures, Dental Advisory Given   Pulmonary asthma , COPD,  COPD inhaler, former smoker,    Pulmonary exam normal breath sounds clear to auscultation       Cardiovascular hypertension, negative cardio ROS   Rhythm:Regular Rate:Normal     Neuro/Psych Seizures -, Well Controlled,  PSYCHIATRIC DISORDERS Anxiety Depression Bipolar Disorder Schizophrenia    GI/Hepatic negative GI ROS, Neg liver ROS,   Endo/Other  Hypothyroidism   Renal/GU negative Renal ROS  negative genitourinary   Musculoskeletal  (+) Arthritis , Osteoarthritis,    Abdominal   Peds  Hematology negative hematology ROS (+)   Anesthesia Other Findings   Reproductive/Obstetrics negative OB ROS                             Anesthesia Physical  Anesthesia Plan  ASA: II  Anesthesia Plan: General   Post-op Pain Management:    Induction: Intravenous  Airway Management Planned: Oral ETT  Additional Equipment:   Intra-op Plan:   Post-operative Plan: Extubation in OR  Informed Consent: I have reviewed the patients History and Physical, chart, labs and discussed the procedure including the risks, benefits and alternatives for the proposed anesthesia with the patient or authorized representative who has indicated his/her understanding and acceptance.   Dental advisory given  Plan Discussed with: CRNA  Anesthesia Plan Comments:         Anesthesia Quick Evaluation

## 2017-01-25 DIAGNOSIS — Z421 Encounter for breast reconstruction following mastectomy: Secondary | ICD-10-CM | POA: Diagnosis not present

## 2017-01-25 NOTE — Discharge Summary (Signed)
Physician Discharge Summary  Patient ID: Courtney Terry MRN: [id number] DOB/AGE: 1960-11-13 56 y.o.  Admit date: 01/24/2017 Discharge date: 01/25/2017  Admission Diagnoses: History breast cancer, acquired absence right breast  Discharge Diagnoses:  Active Problems:   History of breast cancer  Discharged Condition: stable  Hospital Course: Patient with history O2 dependent COPD at night and stayed overnight for observation. Patient tolerated diet and oral pain medication. Ambulatory without assist. Instructed on showering and incision care.   Treatments: surgery: removal right tissue expander and placement saline implant  Discharge Exam: Blood pressure (!) 116/94, pulse 93, temperature 97.7 F (36.5 C), temperature source Oral, resp. rate 16, height 5\' 10"  (1.778 m), weight 61.7 kg (136 lb 0.4 oz), SpO2 100 %. Incision/Wound: chest soft, flat, incision dry  Disposition: 06-Home-Health Care Svc  Discharge Instructions    Call MD for:  redness, tenderness, or signs of infection (pain, swelling, bleeding, redness, odor or green/yellow discharge around incision site)    Complete by:  As directed    Call MD for:  temperature >100.5    Complete by:  As directed    Discharge instructions    Complete by:  As directed    Ok to remove dressings and shower am 4.22.18. Soap and water ok, pat incisions dry. No creams or ointments over incisions.   Breast binder or soft compression bra all other times.  Ok to raise arms above shoulders for bathing and dressing.  No house yard work or exercise until cleared by MD.   Driving Restrictions    Complete by:  As directed    No driving   Lifting restrictions    Complete by:  As directed    No lifting greater than 5 lbs for 2 weeks   Resume previous diet    Complete by:  As directed      Allergies as of 01/25/2017      Reactions   Levaquin [levofloxacin] Hives      Medication List    TAKE these medications   albuterol (2.5 MG/3ML)  0.083% nebulizer solution Commonly known as:  PROVENTIL TAKE 3 MLS BY NEBULIZATION EVERY 4 HOURS AS NEEDED FOR WHEEZING OR SHORTNESS OF BREATH What changed:  Another medication with the same name was changed. Make sure you understand how and when to take each.   albuterol 108 (90 Base) MCG/ACT inhaler Commonly known as:  PROAIR HFA Up to 2pffs every 4 hours if can't catch your breath What changed:  how much to take  how to take this  when to take this  reasons to take this  additional instructions   amLODipine 5 MG tablet Commonly known as:  NORVASC Take 1 tablet (5 mg total) by mouth daily.   anastrozole 1 MG tablet Commonly known as:  ARIMIDEX Take 1 tablet (1 mg total) by mouth daily.   diphenhydrAMINE 50 MG capsule Commonly known as:  BENADRYL Take 50 mg by mouth at bedtime as needed for sleep.   divalproex 500 MG DR tablet Commonly known as:  DEPAKOTE Take 500 mg by mouth 2 (two) times daily.   doxycycline 50 MG capsule Commonly known as:  VIBRAMYCIN Take 2 capsules (100 mg total) by mouth 2 (two) times daily.   feeding supplement (ENSURE ENLIVE) Liqd Take 237 mLs by mouth daily.   feeding supplement Liqd Take 1 Container by mouth daily.   FLUoxetine 20 MG capsule Commonly known as:  PROZAC Take 20 mg by mouth daily.   fluticasone 50 MCG/ACT  nasal spray Commonly known as:  FLONASE Place 2 sprays into both nostrils daily.   Glycopyrrolate-Formoterol 9-4.8 MCG/ACT Aero Commonly known as:  BEVESPI AEROSPHERE Inhale 2 puffs into the lungs 2 (two) times daily.   levothyroxine 75 MCG tablet Commonly known as:  SYNTHROID, LEVOTHROID Take 1 tablet (75 mcg total) by mouth daily.   nicotine 14 mg/24hr patch Commonly known as:  NICODERM CQ - dosed in mg/24 hours Place 1 patch (14 mg total) onto the skin daily. What changed:  when to take this  reasons to take this   oxyCODONE-acetaminophen 10-325 MG tablet Commonly known as:  PERCOCET Take 1 tablet  by mouth every 4 (four) hours as needed for pain.   OXYGEN Inhale 2 L into the lungs as needed.   paliperidone 9 MG 24 hr tablet Commonly known as:  INVEGA Take 9 mg by mouth at bedtime.      Follow-up Information    Dawan Farney, MD In 1 week.   Specialty:  Plastic Surgery Why:  as scheduled Contact information: [address] [place] [address] [phone]           Signed: Irene Limbo 01/25/2017, 7:54 AM

## 2017-01-25 NOTE — Progress Notes (Signed)
Pt discharged home given prescriptions for ABT and pain meds, next appointment instructed and understood, accompanied by Nurse tech, given all personal belongings including from safety lock, no complain of pain given percocet prior to discharge, peripheral IV discontinued, no s/s of distress noted.

## 2017-01-25 NOTE — Care Management Note (Signed)
Case Management Note  Patient Details  Name: Courtney Terry MRN: [id number] Date of Birth: 01/11/1961  Subjective/Objective:                  surgery: removal right tissue expander and placement saline implant Action/Plan: Discharge planning Expected Discharge Date:  01/25/17               Expected Discharge Plan:  Home/Self Care  In-House Referral:     Discharge planning Services  CM Consult, Medication Assistance  Post Acute Care Choice:  NA Choice offered to:  NA  DME Arranged:  N/A DME Agency:  NA  HH Arranged:  NA HH Agency:  NA  Status of Service:  Completed, signed off  If discussed at [place] of Stay Meetings, dates discussed:    Additional Comments: Consult placed for med asst as pt states she runs out of money and cannot get her medication. Pt has Medicaid and does not meet criteria for [facility] program. No other CM needs were communicated. Dellie Catholic, RN 01/25/2017, 8:20 AM

## 2017-01-26 ENCOUNTER — Encounter (HOSPITAL_COMMUNITY): Payer: Self-pay | Admitting: Plastic Surgery

## 2017-02-06 ENCOUNTER — Other Ambulatory Visit: Payer: Self-pay | Admitting: Internal Medicine

## 2017-03-06 ENCOUNTER — Ambulatory Visit: Payer: Self-pay | Admitting: Hematology

## 2017-03-06 ENCOUNTER — Other Ambulatory Visit: Payer: Self-pay

## 2017-03-14 ENCOUNTER — Telehealth: Payer: Self-pay

## 2017-03-14 NOTE — Telephone Encounter (Signed)
Called and left a new f/u appt per inbox message  Jabreel Chimento

## 2017-03-26 ENCOUNTER — Other Ambulatory Visit: Payer: Self-pay | Admitting: Internal Medicine

## 2017-04-16 ENCOUNTER — Other Ambulatory Visit: Payer: Self-pay

## 2017-04-16 ENCOUNTER — Ambulatory Visit: Payer: Self-pay | Admitting: Hematology

## 2017-04-17 ENCOUNTER — Other Ambulatory Visit: Payer: Self-pay | Admitting: Internal Medicine

## 2017-04-18 ENCOUNTER — Other Ambulatory Visit: Payer: Self-pay | Admitting: Internal Medicine

## 2017-04-18 ENCOUNTER — Telehealth: Payer: Self-pay | Admitting: Internal Medicine

## 2017-04-18 MED ORDER — GLYCOPYRROLATE-FORMOTEROL 9-4.8 MCG/ACT IN AERO: 2.0000 | INHALATION_SPRAY | Freq: Two times a day (BID) | RESPIRATORY_TRACT | 0 refills | Status: DC

## 2017-04-18 NOTE — Telephone Encounter (Signed)
Called and spoke with pt and she is aware of appt with MW on Monday at 215 and she is aware that she will need to keep this appt for any further refills.  Refill of the bevespi has been sent to the pharmacy as the pt is at the pharmacy now.

## 2017-04-21 ENCOUNTER — Ambulatory Visit: Payer: Self-pay | Admitting: Internal Medicine

## 2017-04-22 ENCOUNTER — Encounter: Payer: Self-pay | Admitting: Pulmonary Disease

## 2017-04-22 ENCOUNTER — Ambulatory Visit (INDEPENDENT_AMBULATORY_CARE_PROVIDER_SITE_OTHER): Payer: Medicaid Other | Admitting: Pulmonary Disease

## 2017-04-22 VITALS — BP 112/68 | HR 91 | Ht 70.0 in | Wt 120.2 lb

## 2017-04-22 DIAGNOSIS — J449 Chronic obstructive pulmonary disease, unspecified: Secondary | ICD-10-CM

## 2017-04-22 MED ORDER — GLYCOPYRROLATE-FORMOTEROL 9-4.8 MCG/ACT IN AERO: 2.0000 | INHALATION_SPRAY | Freq: Two times a day (BID) | RESPIRATORY_TRACT | 0 refills | Status: AC

## 2017-04-22 MED ORDER — AMLODIPINE BESYLATE 5 MG PO TABS: 5.0000 mg | ORAL_TABLET | Freq: Every day | ORAL | 2 refills | Status: DC

## 2017-04-22 MED ORDER — GLYCOPYRROLATE-FORMOTEROL 9-4.8 MCG/ACT IN AERO: 2.0000 | INHALATION_SPRAY | Freq: Two times a day (BID) | RESPIRATORY_TRACT | 6 refills | Status: DC

## 2017-04-22 MED ORDER — AZITHROMYCIN 250 MG PO TABS: ORAL_TABLET | ORAL | 0 refills | Status: AC

## 2017-04-22 MED ORDER — LEVALBUTEROL HCL 0.63 MG/3ML IN NEBU
0.6300 mg | INHALATION_SOLUTION | Freq: Once | RESPIRATORY_TRACT | Status: AC
  Administered 2017-04-22: 0.63 mg via RESPIRATORY_TRACT

## 2017-04-22 MED ORDER — ALBUTEROL SULFATE HFA 108 (90 BASE) MCG/ACT IN AERS: 2.0000 | INHALATION_SPRAY | RESPIRATORY_TRACT | 2 refills | Status: DC | PRN

## 2017-04-22 MED ORDER — LEVOTHYROXINE SODIUM 75 MCG PO TABS: 75.0000 ug | ORAL_TABLET | Freq: Every day | ORAL | 2 refills | Status: DC

## 2017-04-22 MED ORDER — METHYLPREDNISOLONE ACETATE 80 MG/ML IJ SUSP
80.0000 mg | Freq: Once | INTRAMUSCULAR | Status: AC
  Administered 2017-04-22: 80 mg via INTRAMUSCULAR

## 2017-04-22 MED ORDER — PREDNISONE 10 MG PO TABS: ORAL_TABLET | ORAL | 0 refills | Status: DC

## 2017-04-22 NOTE — Addendum Note (Signed)
Addended by: Maryanna Shape A on: 04/22/2017 03:30 PM   Modules accepted: Orders

## 2017-04-22 NOTE — Patient Instructions (Signed)
We will giveXopenex nebs, depo IM Will give prednisone and antibiotics to take as outpatient.  We will restart bevespi, albuterol rescue inhaler Please start taking your norvasc and synthyroid  Go to ED if there is worsening of breathing.

## 2017-04-22 NOTE — Addendum Note (Signed)
Addended by: Maryanna Shape A on: 04/22/2017 03:50 PM   Modules accepted: Orders
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
## 2017-05-08 ENCOUNTER — Other Ambulatory Visit: Payer: Self-pay | Admitting: *Deleted

## 2017-05-08 DIAGNOSIS — C50411 Malignant neoplasm of upper-outer quadrant of right female breast: Secondary | ICD-10-CM

## 2017-05-08 MED ORDER — ANASTROZOLE 1 MG PO TABS: 1.0000 mg | ORAL_TABLET | Freq: Every day | ORAL | 1 refills | Status: DC

## 2017-05-09 ENCOUNTER — Telehealth: Payer: Self-pay

## 2017-05-09 NOTE — Telephone Encounter (Signed)
Called Courtney Terry and left a message on her voice mail of up coming appointment on 8/24 scheduled mail

## 2017-05-24 NOTE — Progress Notes (Signed)
No show  This encounter was created in error - please disregard.

## 2017-05-30 ENCOUNTER — Other Ambulatory Visit: Payer: Self-pay

## 2017-05-30 ENCOUNTER — Encounter: Payer: Self-pay | Admitting: Hematology

## 2017-06-05 ENCOUNTER — Telehealth: Payer: Self-pay | Admitting: Internal Medicine

## 2017-06-05 NOTE — Telephone Encounter (Signed)
LM for patient

## 2017-06-06 MED ORDER — GLYCOPYRROLATE-FORMOTEROL 9-4.8 MCG/ACT IN AERO: 2.0000 | INHALATION_SPRAY | Freq: Two times a day (BID) | RESPIRATORY_TRACT | 0 refills | Status: DC

## 2017-06-06 NOTE — Telephone Encounter (Signed)
Called and spoke with pt and she stated that with the heat and humidity and she stated that she has been using her bevespi and her albuterol inhaler about 3-4 times per day.  She is now out of her bevespi and she stated that the pharmacy will not refill this again for her.  She is wanting to know what CY would like her to do.  She stated that she has been having some wheezing.  Please advise. Thanks   Allergies  Allergen Reactions  . Levaquin [Levofloxacin] Hives

## 2017-06-06 NOTE — Telephone Encounter (Signed)
Ok sample x 2 Bevespi     Inhale 2 puffs, twice daily. Use nebulizer machine up to 4 times daily if needed.

## 2017-06-06 NOTE — Telephone Encounter (Signed)
Left a message for patient advising that two samples of Bevespi have placed up front for her to pick up.

## 2017-06-11 ENCOUNTER — Telehealth: Payer: Self-pay | Admitting: Internal Medicine

## 2017-06-11 NOTE — Telephone Encounter (Signed)
Noted.  Will close encounter.  

## 2017-07-09 ENCOUNTER — Other Ambulatory Visit: Payer: Self-pay | Admitting: Hematology

## 2017-07-09 DIAGNOSIS — C50411 Malignant neoplasm of upper-outer quadrant of right female breast: Secondary | ICD-10-CM

## 2017-07-10 ENCOUNTER — Telehealth: Payer: Self-pay | Admitting: Hematology

## 2017-07-10 NOTE — Telephone Encounter (Signed)
Left message for patient regarding appts scheduled per 10/3 sch msg. Sending confirmation letter in the mail as well.

## 2017-07-14 ENCOUNTER — Telehealth: Payer: Self-pay | Admitting: Internal Medicine

## 2017-07-14 NOTE — Telephone Encounter (Signed)
Left message for patient to call back  

## 2017-07-15 MED ORDER — GLYCOPYRROLATE-FORMOTEROL 9-4.8 MCG/ACT IN AERO: 2.0000 | INHALATION_SPRAY | Freq: Two times a day (BID) | RESPIRATORY_TRACT | 0 refills | Status: DC

## 2017-07-15 MED ORDER — DOXYCYCLINE HYCLATE 100 MG PO TABS: ORAL_TABLET | ORAL | 0 refills | Status: DC

## 2017-07-15 NOTE — Telephone Encounter (Signed)
Pt is aware of CY's recommendations and voiced her understanding. Rx has been sent to preferred pharmacy. Nothing further needed.  

## 2017-07-15 NOTE — Telephone Encounter (Signed)
Called and spoke with pt and she is requesting the following:    1.  Sample of the bevespi since she stated that her medicaid will not pay for another yet  2.  Wanting to get an abx called to the pharmacy for her cold that she has had for 3 weeks.  Cough with yellow sputum and chills onlly.    CY please advise. Thanks  Allergies  Allergen Reactions  . Levaquin [Levofloxacin] Hives

## 2017-07-15 NOTE — Telephone Encounter (Signed)
Ok [employer]- inhale 2 puffs, twice daily             Ok doxycycline 100 mg, # 8, 2 today then one daily

## 2017-07-15 NOTE — Telephone Encounter (Signed)
Patient returned call, CB is [phone].

## 2017-07-15 NOTE — Telephone Encounter (Signed)
lmtcb X1 for pt. Samples left up front for pt. Will call in abx after speaking to pt.

## 2017-08-04 ENCOUNTER — Telehealth: Payer: Self-pay | Admitting: Internal Medicine

## 2017-08-04 NOTE — Telephone Encounter (Signed)
This is MW's pt  LMTCB

## 2017-08-05 NOTE — Telephone Encounter (Signed)
Samples given. PM she would like to know if you would refill the levothyroxine? I wasn't sure if you wanted to keep prescribing this for pt. Please advise.

## 2017-08-05 NOTE — Telephone Encounter (Signed)
Called pt and advised message from the provider. Pt understood and verbalized understanding. Nothing further is needed.    

## 2017-08-05 NOTE — Telephone Encounter (Signed)
Pt calling back a/b some samples pt says she can hardly breathe, please advise.Hillery Hunter

## 2017-08-05 NOTE — Telephone Encounter (Signed)
We refilled one time till she can get her primary care to send in a prescription Please asked her to follow-up with primary care for further prescriptions of hypertension meds and Synthroid.  Marshell Garfinkel MD [facility] 08/05/2017, 2:50 PM

## 2017-08-05 NOTE — Telephone Encounter (Signed)
Case manager Barnett Applebaum [phone]) waiting in lobby to pick up samples;

## 2017-08-13 ENCOUNTER — Ambulatory Visit: Payer: Self-pay | Admitting: Hematology

## 2017-08-13 ENCOUNTER — Other Ambulatory Visit: Payer: Self-pay

## 2017-08-14 ENCOUNTER — Ambulatory Visit (HOSPITAL_BASED_OUTPATIENT_CLINIC_OR_DEPARTMENT_OTHER): Payer: Medicaid Other | Admitting: Hematology

## 2017-08-14 ENCOUNTER — Telehealth: Payer: Self-pay | Admitting: Hematology

## 2017-08-14 ENCOUNTER — Encounter: Payer: Self-pay | Admitting: Hematology

## 2017-08-14 ENCOUNTER — Other Ambulatory Visit (HOSPITAL_BASED_OUTPATIENT_CLINIC_OR_DEPARTMENT_OTHER): Payer: Medicaid Other

## 2017-08-14 VITALS — BP 115/68 | HR 102 | Temp 98.8°F | Resp 18

## 2017-08-14 DIAGNOSIS — C50419 Malignant neoplasm of upper-outer quadrant of unspecified female breast: Secondary | ICD-10-CM

## 2017-08-14 DIAGNOSIS — Z79811 Long term (current) use of aromatase inhibitors: Secondary | ICD-10-CM

## 2017-08-14 DIAGNOSIS — Z23 Encounter for immunization: Secondary | ICD-10-CM | POA: Diagnosis not present

## 2017-08-14 DIAGNOSIS — Z170 Estrogen receptor positive status [ER+]: Secondary | ICD-10-CM | POA: Diagnosis not present

## 2017-08-14 DIAGNOSIS — C50411 Malignant neoplasm of upper-outer quadrant of right female breast: Secondary | ICD-10-CM

## 2017-08-14 DIAGNOSIS — E2839 Other primary ovarian failure: Secondary | ICD-10-CM

## 2017-08-14 LAB — COMPREHENSIVE METABOLIC PANEL
ALT: 9 U/L (ref 0–55)
ANION GAP: 8 meq/L (ref 3–11)
AST: 15 U/L (ref 5–34)
Albumin: 3.3 g/dL — ABNORMAL LOW (ref 3.5–5.0)
Alkaline Phosphatase: 89 U/L (ref 40–150)
BILIRUBIN TOTAL: 0.46 mg/dL (ref 0.20–1.20)
BUN: 11.4 mg/dL (ref 7.0–26.0)
CO2: 31 meq/L — AB (ref 22–29)
CREATININE: 0.7 mg/dL (ref 0.6–1.1)
Calcium: 9.1 mg/dL (ref 8.4–10.4)
Chloride: 100 mEq/L (ref 98–109)
EGFR: >60 mL/min/{1.73_m2} (ref >60)
GLUCOSE: 110 mg/dL (ref 70–140)
Potassium: 4 mEq/L (ref 3.5–5.1)
Sodium: 140 mEq/L (ref 136–145)
TOTAL PROTEIN: 7 g/dL (ref 6.4–8.3)

## 2017-08-14 LAB — CBC WITH DIFFERENTIAL/PLATELET
BASO%: 0.6 % (ref 0.0–2.0)
Basophils Absolute: 0 10*3/uL (ref 0.0–0.1)
EOS%: 1.1 % (ref 0.0–7.0)
Eosinophils Absolute: 0.1 10*3/uL (ref 0.0–0.5)
HCT: 47.3 % — ABNORMAL HIGH (ref 34.8–46.6)
HGB: 14.9 g/dL (ref 11.6–15.9)
LYMPH#: 0.9 10*3/uL (ref 0.9–3.3)
LYMPH%: 14.3 % (ref 14.0–49.7)
MCH: 26.7 pg (ref 25.1–34.0)
MCHC: 31.4 g/dL — ABNORMAL LOW (ref 31.5–36.0)
MCV: 85 fL (ref 79.5–101.0)
MONO#: 0.6 10*3/uL (ref 0.1–0.9)
MONO%: 9.3 % (ref 0.0–14.0)
NEUT%: 74.7 % (ref 38.4–76.8)
NEUTROS ABS: 4.7 10*3/uL (ref 1.5–6.5)
PLATELETS: 229 10*3/uL (ref 145–400)
RBC: 5.57 10*6/uL — ABNORMAL HIGH (ref 3.70–5.45)
RDW: 16.3 % — ABNORMAL HIGH (ref 11.2–14.5)
WBC: 6.3 10*3/uL (ref 3.9–10.3)

## 2017-08-14 MED ORDER — INFLUENZA VAC SPLIT QUAD 0.5 ML IM SUSY
0.5000 mL | PREFILLED_SYRINGE | Freq: Once | INTRAMUSCULAR | Status: AC
  Administered 2017-08-14: 0.5 mL via INTRAMUSCULAR
  Filled 2017-08-14: qty 0.5

## 2017-08-14 MED ORDER — LEVOTHYROXINE SODIUM 75 MCG PO TABS
75.0000 ug | ORAL_TABLET | Freq: Every day | ORAL | 1 refills | Status: DC
Start: 2017-08-14 — End: 2018-12-24
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
## 2017-08-14 NOTE — Telephone Encounter (Signed)
Scheduled appt per 11/8 los - Gave patient AVS and calender per los.  

## 2017-09-03 ENCOUNTER — Telehealth: Payer: Self-pay | Admitting: Internal Medicine

## 2017-09-03 NOTE — Telephone Encounter (Signed)
Per RA:  Pt called answering service and requested Bevespi samples. RA ok'ed for pt to have Bevespi samples.   lmtcb for pt. Will forward to triage to call pt back on 09/04/2017.

## 2017-09-04 MED ORDER — GLYCOPYRROLATE-FORMOTEROL 9-4.8 MCG/ACT IN AERO: 2.0000 | INHALATION_SPRAY | Freq: Two times a day (BID) | RESPIRATORY_TRACT | 0 refills | Status: AC

## 2017-09-04 NOTE — Telephone Encounter (Signed)
lmtcb x2 for pt. Two samples of Bevespi have been placed up front for pickup.

## 2017-09-08 NOTE — Telephone Encounter (Signed)
Pt returned call about samples.  Relayed to pt the samples are ready to pick up. Pt understood.  Nothing else needed.-tr

## 2017-09-09 ENCOUNTER — Encounter (HOSPITAL_COMMUNITY): Payer: Self-pay | Admitting: Emergency Medicine

## 2017-09-09 ENCOUNTER — Other Ambulatory Visit: Payer: Self-pay

## 2017-09-09 DIAGNOSIS — Z853 Personal history of malignant neoplasm of breast: Secondary | ICD-10-CM | POA: Insufficient documentation

## 2017-09-09 DIAGNOSIS — J449 Chronic obstructive pulmonary disease, unspecified: Secondary | ICD-10-CM | POA: Diagnosis not present

## 2017-09-09 DIAGNOSIS — I10 Essential (primary) hypertension: Secondary | ICD-10-CM | POA: Diagnosis not present

## 2017-09-09 DIAGNOSIS — N3001 Acute cystitis with hematuria: Secondary | ICD-10-CM | POA: Insufficient documentation

## 2017-09-09 DIAGNOSIS — R350 Frequency of micturition: Secondary | ICD-10-CM | POA: Insufficient documentation

## 2017-09-09 DIAGNOSIS — Z87891 Personal history of nicotine dependence: Secondary | ICD-10-CM | POA: Diagnosis not present

## 2017-09-09 DIAGNOSIS — R3915 Urgency of urination: Secondary | ICD-10-CM | POA: Diagnosis not present

## 2017-09-09 DIAGNOSIS — J45909 Unspecified asthma, uncomplicated: Secondary | ICD-10-CM | POA: Diagnosis not present

## 2017-09-09 DIAGNOSIS — E039 Hypothyroidism, unspecified: Secondary | ICD-10-CM | POA: Diagnosis not present

## 2017-09-09 DIAGNOSIS — Z79899 Other long term (current) drug therapy: Secondary | ICD-10-CM | POA: Insufficient documentation

## 2017-09-09 DIAGNOSIS — R300 Dysuria: Secondary | ICD-10-CM | POA: Insufficient documentation

## 2017-09-09 DIAGNOSIS — F1410 Cocaine abuse, uncomplicated: Secondary | ICD-10-CM | POA: Diagnosis not present

## 2017-09-09 DIAGNOSIS — R319 Hematuria, unspecified: Secondary | ICD-10-CM | POA: Diagnosis present

## 2017-09-09 LAB — BASIC METABOLIC PANEL
ANION GAP: 10 (ref 5–15)
BUN: 14 mg/dL (ref 6–20)
CHLORIDE: 92 mmol/L — AB (ref 101–111)
CO2: 35 mmol/L — AB (ref 22–32)
Calcium: 8.7 mg/dL — ABNORMAL LOW (ref 8.9–10.3)
Creatinine, Ser: 0.65 mg/dL (ref 0.44–1.00)
GFR calc Af Amer: >60 mL/min (ref >60)
GLUCOSE: 134 mg/dL — AB (ref 65–99)
POTASSIUM: 4.1 mmol/L (ref 3.5–5.1)
Sodium: 137 mmol/L (ref 135–145)

## 2017-09-09 LAB — URINALYSIS, ROUTINE W REFLEX MICROSCOPIC
BILIRUBIN URINE: NEGATIVE
GLUCOSE, UA: NEGATIVE mg/dL
KETONES UR: NEGATIVE mg/dL
Nitrite: NEGATIVE
PH: 5 (ref 5.0–8.0)
Protein, ur: 100 mg/dL — AB
SPECIFIC GRAVITY, URINE: 1.013 (ref 1.005–1.030)

## 2017-09-09 LAB — I-STAT BETA HCG BLOOD, ED (MC, WL, AP ONLY): I-stat hCG, quantitative: <5 m[IU]/mL (ref <5)

## 2017-09-09 LAB — CBC WITH DIFFERENTIAL/PLATELET
BASOS ABS: 0 10*3/uL (ref 0.0–0.1)
Basophils Relative: 0 %
Eosinophils Absolute: 0 10*3/uL (ref 0.0–0.7)
Eosinophils Relative: 0 %
HEMATOCRIT: 46.1 % — AB (ref 36.0–46.0)
Hemoglobin: 14.4 g/dL (ref 12.0–15.0)
LYMPHS ABS: 1.4 10*3/uL (ref 0.7–4.0)
LYMPHS PCT: 20 %
MCH: 27 pg (ref 26.0–34.0)
MCHC: 31.2 g/dL (ref 30.0–36.0)
MCV: 86.5 fL (ref 78.0–100.0)
MONO ABS: 0.6 10*3/uL (ref 0.1–1.0)
Monocytes Relative: 9 %
NEUTROS ABS: 4.9 10*3/uL (ref 1.7–7.7)
Neutrophils Relative %: 71 %
Platelets: 280 10*3/uL (ref 150–400)
RBC: 5.33 MIL/uL — ABNORMAL HIGH (ref 3.87–5.11)
RDW: 17.1 % — AB (ref 11.5–15.5)
WBC: 6.9 10*3/uL (ref 4.0–10.5)

## 2017-09-09 LAB — I-STAT CG4 LACTIC ACID, ED: Lactic Acid, Venous: 1.93 mmol/L — ABNORMAL HIGH (ref 0.5–1.9)

## 2017-09-09 NOTE — ED Triage Notes (Signed)
Pt c/o bloody urination with pain and malodor for a month getting worse with fevers and chills.

## 2017-09-10 ENCOUNTER — Emergency Department (HOSPITAL_COMMUNITY)
Admission: EM | Admit: 2017-09-10 | Discharge: 2017-09-10 | Disposition: A | Discharge status: Home / Self Care | Payer: Medicaid Other | Attending: Emergency Medicine | Admitting: Emergency Medicine

## 2017-09-10 DIAGNOSIS — N3001 Acute cystitis with hematuria: Secondary | ICD-10-CM

## 2017-09-10 LAB — I-STAT CG4 LACTIC ACID, ED: LACTIC ACID, VENOUS: 0.93 mmol/L (ref 0.5–1.9)

## 2017-09-10 MED ORDER — CEPHALEXIN 250 MG PO CAPS
500.0000 mg | ORAL_CAPSULE | Freq: Once | ORAL | Status: AC
  Administered 2017-09-10: 500 mg via ORAL
  Filled 2017-09-10: qty 2

## 2017-09-10 MED ORDER — PHENAZOPYRIDINE HCL 200 MG PO TABS: 200.0000 mg | ORAL_TABLET | Freq: Three times a day (TID) | ORAL | 0 refills | Status: DC | PRN

## 2017-09-10 MED ORDER — CEPHALEXIN 500 MG PO CAPS: 500.0000 mg | ORAL_CAPSULE | Freq: Two times a day (BID) | ORAL | 0 refills | Status: DC

## 2017-09-10 MED ORDER — PHENAZOPYRIDINE HCL 100 MG PO TABS
200.0000 mg | ORAL_TABLET | Freq: Once | ORAL | Status: AC
  Administered 2017-09-10: 200 mg via ORAL
  Filled 2017-09-10: qty 2
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
## 2017-09-17 ENCOUNTER — Ambulatory Visit
Admission: RE | Admit: 2017-09-17 | Discharge: 2017-09-17 | Disposition: A | Discharge status: Home / Self Care | Payer: Medicaid Other | Source: Ambulatory Visit | Attending: Hematology | Admitting: Hematology

## 2017-09-17 DIAGNOSIS — Z170 Estrogen receptor positive status [ER+]: Principal | ICD-10-CM

## 2017-09-17 DIAGNOSIS — E2839 Other primary ovarian failure: Secondary | ICD-10-CM

## 2017-09-17 DIAGNOSIS — C50411 Malignant neoplasm of upper-outer quadrant of right female breast: Secondary | ICD-10-CM

## 2017-09-18 NOTE — Progress Notes (Signed)
Pt refused to complete assessment or take pre-op instructions. Pt stated " I already know ! "  Spoke with Maudie Mercury, Surgical Coordinator earlier today for an alternate contact number for pt. Maudie Mercury stated that a friend of the pt called and was given pre-op instructions for the pt at that time.

## 2017-09-19 ENCOUNTER — Encounter (HOSPITAL_COMMUNITY)
Admission: RE | Disposition: A | Discharge status: Home / Self Care | Payer: Self-pay | Source: Ambulatory Visit | Attending: Plastic Surgery

## 2017-09-19 ENCOUNTER — Ambulatory Visit (HOSPITAL_COMMUNITY): Payer: Medicaid Other | Admitting: Certified Registered Nurse Anesthetist

## 2017-09-19 ENCOUNTER — Encounter (HOSPITAL_COMMUNITY): Payer: Self-pay

## 2017-09-19 ENCOUNTER — Other Ambulatory Visit: Payer: Self-pay

## 2017-09-19 ENCOUNTER — Observation Stay (HOSPITAL_COMMUNITY)
Admission: RE | Admit: 2017-09-19 | Discharge: 2017-09-20 | Disposition: A | Discharge status: Home / Self Care | Payer: Medicaid Other | Source: Ambulatory Visit | Attending: Plastic Surgery | Admitting: Plastic Surgery

## 2017-09-19 DIAGNOSIS — Z87891 Personal history of nicotine dependence: Secondary | ICD-10-CM | POA: Insufficient documentation

## 2017-09-19 DIAGNOSIS — Z853 Personal history of malignant neoplasm of breast: Secondary | ICD-10-CM | POA: Diagnosis not present

## 2017-09-19 DIAGNOSIS — E039 Hypothyroidism, unspecified: Secondary | ICD-10-CM | POA: Insufficient documentation

## 2017-09-19 DIAGNOSIS — M1990 Unspecified osteoarthritis, unspecified site: Secondary | ICD-10-CM | POA: Insufficient documentation

## 2017-09-19 DIAGNOSIS — F418 Other specified anxiety disorders: Secondary | ICD-10-CM | POA: Insufficient documentation

## 2017-09-19 DIAGNOSIS — I10 Essential (primary) hypertension: Secondary | ICD-10-CM | POA: Diagnosis not present

## 2017-09-19 DIAGNOSIS — F209 Schizophrenia, unspecified: Secondary | ICD-10-CM | POA: Diagnosis not present

## 2017-09-19 DIAGNOSIS — E46 Unspecified protein-calorie malnutrition: Secondary | ICD-10-CM | POA: Insufficient documentation

## 2017-09-19 DIAGNOSIS — J449 Chronic obstructive pulmonary disease, unspecified: Secondary | ICD-10-CM | POA: Diagnosis not present

## 2017-09-19 DIAGNOSIS — T8549XA Other mechanical complication of breast prosthesis and implant, initial encounter: Secondary | ICD-10-CM | POA: Diagnosis not present

## 2017-09-19 DIAGNOSIS — Z9011 Acquired absence of right breast and nipple: Secondary | ICD-10-CM | POA: Insufficient documentation

## 2017-09-19 DIAGNOSIS — Y831 Surgical operation with implant of artificial internal device as the cause of abnormal reaction of the patient, or of later complication, without mention of misadventure at the time of the procedure: Secondary | ICD-10-CM | POA: Diagnosis not present

## 2017-09-19 DIAGNOSIS — Z9012 Acquired absence of left breast and nipple: Secondary | ICD-10-CM | POA: Diagnosis not present

## 2017-09-19 HISTORY — PX: PLACEMENT OF BREAST IMPLANTS: SHX6334

## 2017-09-19 LAB — GLUCOSE, CAPILLARY: GLUCOSE-CAPILLARY: 140 mg/dL — AB (ref 65–99)

## 2017-09-19 SURGERY — INSERTION, IMPLANT, BREAST
Anesthesia: General | Site: Breast | Laterality: Left

## 2017-09-19 MED ORDER — AMLODIPINE BESYLATE 5 MG PO TABS
5.0000 mg | ORAL_TABLET | Freq: Every day | ORAL | Status: DC
  Administered 2017-09-19: 5 mg via ORAL
  Filled 2017-09-19: qty 1

## 2017-09-19 MED ORDER — PHENYLEPHRINE HCL 10 MG/ML IJ SOLN
INTRAVENOUS | Status: DC | PRN
  Administered 2017-09-19: 25 ug/min via INTRAVENOUS

## 2017-09-19 MED ORDER — LIDOCAINE HCL (CARDIAC) 20 MG/ML IV SOLN
INTRAVENOUS | Status: DC | PRN
  Administered 2017-09-19: 60 mg via INTRAVENOUS

## 2017-09-19 MED ORDER — PHENYLEPHRINE 40 MCG/ML (10ML) SYRINGE FOR IV PUSH (FOR BLOOD PRESSURE SUPPORT)
PREFILLED_SYRINGE | INTRAVENOUS | Status: AC
  Filled 2017-09-19: qty 10

## 2017-09-19 MED ORDER — GENTAMICIN SULFATE 40 MG/ML IJ SOLN
Freq: Once | INTRAMUSCULAR | Status: DC
  Filled 2017-09-19: qty 1

## 2017-09-19 MED ORDER — UMECLIDINIUM BROMIDE 62.5 MCG/INH IN AEPB
1.0000 | INHALATION_SPRAY | Freq: Every day | RESPIRATORY_TRACT | Status: DC
  Filled 2017-09-19: qty 7

## 2017-09-19 MED ORDER — PROPOFOL 10 MG/ML IV BOLUS
INTRAVENOUS | Status: DC | PRN
  Administered 2017-09-19: 20 mg via INTRAVENOUS
  Administered 2017-09-19: 120 mg via INTRAVENOUS

## 2017-09-19 MED ORDER — EPHEDRINE SULFATE 50 MG/ML IJ SOLN
INTRAMUSCULAR | Status: DC | PRN
  Administered 2017-09-19: 5 mg via INTRAVENOUS

## 2017-09-19 MED ORDER — PHENYLEPHRINE HCL 10 MG/ML IJ SOLN
INTRAMUSCULAR | Status: DC | PRN
  Administered 2017-09-19 (×3): 80 ug via INTRAVENOUS

## 2017-09-19 MED ORDER — ONDANSETRON HCL 4 MG/2ML IJ SOLN
INTRAMUSCULAR | Status: DC | PRN
  Administered 2017-09-19: 4 mg via INTRAVENOUS

## 2017-09-19 MED ORDER — 0.9 % SODIUM CHLORIDE (POUR BTL) OPTIME
TOPICAL | Status: DC | PRN
  Administered 2017-09-19: 1000 mL

## 2017-09-19 MED ORDER — FENTANYL CITRATE (PF) 250 MCG/5ML IJ SOLN
INTRAMUSCULAR | Status: AC
  Filled 2017-09-19: qty 5

## 2017-09-19 MED ORDER — ONDANSETRON HCL 4 MG/2ML IJ SOLN: 4.0000 mg | Freq: Four times a day (QID) | INTRAMUSCULAR | Status: DC | PRN

## 2017-09-19 MED ORDER — LACTATED RINGERS IV SOLN
INTRAVENOUS | Status: DC
  Administered 2017-09-19 (×3): via INTRAVENOUS

## 2017-09-19 MED ORDER — SODIUM CHLORIDE 0.9 % IV SOLN
INTRAVENOUS | Status: DC | PRN
  Administered 2017-09-19: 13:00:00

## 2017-09-19 MED ORDER — FLUOXETINE HCL 20 MG PO CAPS
20.0000 mg | ORAL_CAPSULE | Freq: Every day | ORAL | Status: DC
  Administered 2017-09-19: 20 mg via ORAL
  Filled 2017-09-19: qty 1

## 2017-09-19 MED ORDER — ARFORMOTEROL TARTRATE 15 MCG/2ML IN NEBU
15.0000 ug | INHALATION_SOLUTION | Freq: Two times a day (BID) | RESPIRATORY_TRACT | Status: DC
  Administered 2017-09-19: 15 ug via RESPIRATORY_TRACT
  Filled 2017-09-19: qty 2

## 2017-09-19 MED ORDER — OXYCODONE HCL 5 MG/5ML PO SOLN: 5.0000 mg | Freq: Once | ORAL | Status: DC | PRN

## 2017-09-19 MED ORDER — LEVOTHYROXINE SODIUM 75 MCG PO TABS
75.0000 ug | ORAL_TABLET | Freq: Every day | ORAL | Status: DC
  Administered 2017-09-19: 75 ug via ORAL
  Filled 2017-09-19: qty 1

## 2017-09-19 MED ORDER — MIDAZOLAM HCL 2 MG/2ML IJ SOLN
INTRAMUSCULAR | Status: AC
  Filled 2017-09-19: qty 2

## 2017-09-19 MED ORDER — ENSURE ENLIVE PO LIQD: 237.0000 mL | ORAL | Status: DC

## 2017-09-19 MED ORDER — PROPOFOL 10 MG/ML IV BOLUS
INTRAVENOUS | Status: AC
  Filled 2017-09-19: qty 20

## 2017-09-19 MED ORDER — KCL IN DEXTROSE-NACL 20-5-0.45 MEQ/L-%-% IV SOLN
INTRAVENOUS | Status: DC
  Administered 2017-09-19: 18:00:00 via INTRAVENOUS
  Filled 2017-09-19: qty 1000

## 2017-09-19 MED ORDER — FLUMAZENIL 0.5 MG/5ML IV SOLN
INTRAVENOUS | Status: AC
  Filled 2017-09-19: qty 5

## 2017-09-19 MED ORDER — DEXAMETHASONE SODIUM PHOSPHATE 10 MG/ML IJ SOLN
INTRAMUSCULAR | Status: AC
  Filled 2017-09-19: qty 1

## 2017-09-19 MED ORDER — ONDANSETRON 4 MG PO TBDP
4.0000 mg | ORAL_TABLET | Freq: Four times a day (QID) | ORAL | Status: DC | PRN
  Filled 2017-09-19: qty 1

## 2017-09-19 MED ORDER — ONDANSETRON HCL 4 MG/2ML IJ SOLN
INTRAMUSCULAR | Status: AC
  Filled 2017-09-19: qty 2

## 2017-09-19 MED ORDER — OXYCODONE-ACETAMINOPHEN 5-325 MG PO TABS
1.0000 | ORAL_TABLET | ORAL | Status: DC | PRN
  Administered 2017-09-19: 2 via ORAL
  Filled 2017-09-19 (×2): qty 2

## 2017-09-19 MED ORDER — FENTANYL CITRATE (PF) 100 MCG/2ML IJ SOLN: 25.0000 ug | INTRAMUSCULAR | Status: DC | PRN

## 2017-09-19 MED ORDER — FLUMAZENIL 0.5 MG/5ML IV SOLN
INTRAVENOUS | Status: DC | PRN
  Administered 2017-09-19 (×2): 0.2 mg via INTRAVENOUS

## 2017-09-19 MED ORDER — NALOXONE HCL 0.4 MG/ML IJ SOLN
INTRAMUSCULAR | Status: AC
  Filled 2017-09-19: qty 1

## 2017-09-19 MED ORDER — DIVALPROEX SODIUM 500 MG PO DR TAB
500.0000 mg | DELAYED_RELEASE_TABLET | Freq: Two times a day (BID) | ORAL | Status: DC
  Filled 2017-09-19: qty 1

## 2017-09-19 MED ORDER — OLANZAPINE 5 MG PO TBDP
10.0000 mg | ORAL_TABLET | Freq: Every day | ORAL | Status: DC
  Administered 2017-09-19: 10 mg via ORAL
  Filled 2017-09-19 (×2): qty 2

## 2017-09-19 MED ORDER — SUCCINYLCHOLINE CHLORIDE 20 MG/ML IJ SOLN
INTRAMUSCULAR | Status: DC | PRN
  Administered 2017-09-19: 80 mg via INTRAVENOUS

## 2017-09-19 MED ORDER — ALBUTEROL SULFATE HFA 108 (90 BASE) MCG/ACT IN AERS: 2.0000 | INHALATION_SPRAY | RESPIRATORY_TRACT | Status: DC | PRN

## 2017-09-19 MED ORDER — DEXAMETHASONE SODIUM PHOSPHATE 4 MG/ML IJ SOLN
INTRAMUSCULAR | Status: DC | PRN
  Administered 2017-09-19: 10 mg via INTRAVENOUS

## 2017-09-19 MED ORDER — MORPHINE SULFATE (PF) 2 MG/ML IV SOLN: 1.0000 mg | INTRAVENOUS | Status: DC | PRN

## 2017-09-19 MED ORDER — FENTANYL CITRATE (PF) 100 MCG/2ML IJ SOLN
INTRAMUSCULAR | Status: DC | PRN
  Administered 2017-09-19 (×2): 50 ug via INTRAVENOUS

## 2017-09-19 MED ORDER — VANCOMYCIN HCL IN DEXTROSE 1-5 GM/200ML-% IV SOLN
INTRAVENOUS | Status: AC
  Filled 2017-09-19: qty 200

## 2017-09-19 MED ORDER — IPRATROPIUM BROMIDE HFA 17 MCG/ACT IN AERS
INHALATION_SPRAY | RESPIRATORY_TRACT | Status: DC | PRN
  Administered 2017-09-19: 6 via RESPIRATORY_TRACT

## 2017-09-19 MED ORDER — NALOXONE HCL 0.4 MG/ML IJ SOLN
INTRAMUSCULAR | Status: DC | PRN
  Administered 2017-09-19 (×5): 80 ug via INTRAVENOUS

## 2017-09-19 MED ORDER — MIDAZOLAM HCL 5 MG/5ML IJ SOLN
INTRAMUSCULAR | Status: DC | PRN
  Administered 2017-09-19: 2 mg via INTRAVENOUS

## 2017-09-19 MED ORDER — ALBUTEROL SULFATE (2.5 MG/3ML) 0.083% IN NEBU: 2.5000 mg | INHALATION_SOLUTION | RESPIRATORY_TRACT | Status: DC | PRN

## 2017-09-19 MED ORDER — SUCCINYLCHOLINE CHLORIDE 200 MG/10ML IV SOSY
PREFILLED_SYRINGE | INTRAVENOUS | Status: AC
  Filled 2017-09-19: qty 10

## 2017-09-19 MED ORDER — OXYCODONE HCL 5 MG PO TABS: 5.0000 mg | ORAL_TABLET | Freq: Once | ORAL | Status: DC | PRN

## 2017-09-19 MED ORDER — VANCOMYCIN HCL 1000 MG IV SOLR
INTRAVENOUS | Status: DC | PRN
  Administered 2017-09-19: 1000 mg via INTRAVENOUS

## 2017-09-19 SURGICAL SUPPLY — 70 items
ADH SKN CLS APL DERMABOND .7 (GAUZE/BANDAGES/DRESSINGS) ×2
ADH SKN CLS LQ APL DERMABOND (GAUZE/BANDAGES/DRESSINGS) ×1
BAG DECANTER FOR FLEXI CONT (MISCELLANEOUS) ×2 IMPLANT
BINDER BREAST LRG (GAUZE/BANDAGES/DRESSINGS) ×1 IMPLANT
BINDER BREAST XLRG (GAUZE/BANDAGES/DRESSINGS) IMPLANT
CANISTER SUCT 3000ML PPV (MISCELLANEOUS) ×2 IMPLANT
CHLORAPREP W/TINT 26ML (MISCELLANEOUS) ×2 IMPLANT
CLSR STERI-STRIP ANTIMIC 1/2X4 (GAUZE/BANDAGES/DRESSINGS) ×2 IMPLANT
COVER SURGICAL LIGHT HANDLE (MISCELLANEOUS) ×2 IMPLANT
DERMABOND ADHESIVE PROPEN (GAUZE/BANDAGES/DRESSINGS) ×1
DERMABOND ADVANCED (GAUZE/BANDAGES/DRESSINGS) ×2
DERMABOND ADVANCED .7 DNX12 (GAUZE/BANDAGES/DRESSINGS) ×2 IMPLANT
DERMABOND ADVANCED .7 DNX6 (GAUZE/BANDAGES/DRESSINGS) IMPLANT
DRAIN CHANNEL 15F RND FF W/TCR (WOUND CARE) IMPLANT
DRAPE HALF SHEET 40X57 (DRAPES) IMPLANT
DRAPE INCISE IOBAN 66X45 STRL (DRAPES) IMPLANT
DRAPE ORTHO SPLIT 77X108 STRL (DRAPES) ×4
DRAPE SURG ORHT 6 SPLT 77X108 (DRAPES) ×2 IMPLANT
DRAPE WARM FLUID 44X44 (DRAPE) ×2 IMPLANT
DRSG PAD ABDOMINAL 8X10 ST (GAUZE/BANDAGES/DRESSINGS) ×4 IMPLANT
DRSG TEGADERM 2-3/8X2-3/4 SM (GAUZE/BANDAGES/DRESSINGS) ×2 IMPLANT
DRSG TEGADERM 4X4.75 (GAUZE/BANDAGES/DRESSINGS) ×8 IMPLANT
ELECT BLADE 4.0 EZ CLEAN MEGAD (MISCELLANEOUS) ×4
ELECT CAUTERY BLADE 6.4 (BLADE) ×2 IMPLANT
ELECT COATED BLADE 2.86 ST (ELECTRODE) ×2 IMPLANT
ELECT REM PT RETURN 9FT ADLT (ELECTROSURGICAL) ×2
ELECTRODE BLDE 4.0 EZ CLN MEGD (MISCELLANEOUS) ×1 IMPLANT
ELECTRODE REM PT RTRN 9FT ADLT (ELECTROSURGICAL) ×1 IMPLANT
EVACUATOR SILICONE 100CC (DRAIN) IMPLANT
GAUZE SPONGE 4X4 12PLY STRL (GAUZE/BANDAGES/DRESSINGS) ×2 IMPLANT
GLOVE BIO SURGEON STRL SZ 6 (GLOVE) ×6 IMPLANT
GOWN STRL REUS W/ TWL LRG LVL3 (GOWN DISPOSABLE) ×2 IMPLANT
GOWN STRL REUS W/TWL LRG LVL3 (GOWN DISPOSABLE) ×4
IMPL SALINE MOD 175CC (Breast) IMPLANT
IMPLANT SALINE MOD 175CC (Breast) ×2 IMPLANT
KIT BASIN OR (CUSTOM PROCEDURE TRAY) ×2 IMPLANT
KIT ROOM TURNOVER OR (KITS) ×2 IMPLANT
MARKER SKIN DUAL TIP RULER LAB (MISCELLANEOUS) ×2 IMPLANT
NDL 21 GA WING INFUSION (NEEDLE) IMPLANT
NEEDLE 21 GA WING INFUSION (NEEDLE) ×2 IMPLANT
NS IRRIG 1000ML POUR BTL (IV SOLUTION) ×4 IMPLANT
PACK GENERAL/GYN (CUSTOM PROCEDURE TRAY) ×2 IMPLANT
PAD ABD 8X10 STRL (GAUZE/BANDAGES/DRESSINGS) ×1 IMPLANT
PAD ARMBOARD 7.5X6 YLW CONV (MISCELLANEOUS) ×2 IMPLANT
PIN SAFETY STERILE (MISCELLANEOUS) ×2 IMPLANT
SAFSITE REFLUX VALVE END CAP ×1 IMPLANT
SET ASEPTIC TRANSFER (MISCELLANEOUS) ×2 IMPLANT
SIZER BREAST 175CC (SIZER) ×2
SIZER BRST 175CC (SIZER) ×1
SIZER BRST 175CC SAL 1 USE (SIZER) IMPLANT
SOL PREP POV-IOD 4OZ 10% (MISCELLANEOUS) ×2 IMPLANT
STAPLER VISISTAT 35W (STAPLE) ×2 IMPLANT
SUT CHROMIC 4 0 PS 2 18 (SUTURE) IMPLANT
SUT ETHILON 2 0 FS 18 (SUTURE) ×4 IMPLANT
SUT MNCRL 3 0 RB1 (SUTURE) IMPLANT
SUT MNCRL AB 4-0 PS2 18 (SUTURE) IMPLANT
SUT MONOCRYL 3 0 RB1 (SUTURE) ×1
SUT VIC AB 0 CT1 27 (SUTURE)
SUT VIC AB 0 CT1 27XBRD ANBCTR (SUTURE) IMPLANT
SUT VIC AB 3-0 SH 27 (SUTURE) ×2
SUT VIC AB 3-0 SH 27X BRD (SUTURE) IMPLANT
SUT VICRYL 3 0 (SUTURE) IMPLANT
SUT VICRYL 4-0 PS2 18IN ABS (SUTURE) ×1 IMPLANT
SUT VLOC 180 0 24IN GS25 (SUTURE) IMPLANT
SYR BULB IRRIGATION 50ML (SYRINGE) ×3 IMPLANT
TAPE STRIPS DRAPE STRL (GAUZE/BANDAGES/DRESSINGS) ×2 IMPLANT
TOWEL OR 17X24 6PK STRL BLUE (TOWEL DISPOSABLE) ×2 IMPLANT
TOWEL OR 17X26 10 PK STRL BLUE (TOWEL DISPOSABLE) ×2 IMPLANT
TRAY FOLEY W/METER SILVER 16FR (SET/KITS/TRAYS/PACK) IMPLANT
TUBE CONNECTING 12X1/4 (SUCTIONS) ×2 IMPLANT

## 2017-09-19 NOTE — Op Note (Signed)
Operative Note   DATE OF OPERATION: 12.14.18  LOCATION: [facility] Main OR-observation  SURGICAL DIVISION: Plastic Surgery  PREOPERATIVE DIAGNOSES:  1. History breast cancer 2. Acquired absence right breast 3. Rupture left breast implant  POSTOPERATIVE DIAGNOSES:  same  PROCEDURE:  Removal and replacement left breast saline implant  SURGEON: Irene Limbo MD MBA  ASSISTANT: none3  ANESTHESIA:  General.   EBL: 25 ml  COMPLICATIONS: None immediate.   INDICATIONS FOR PROCEDURE:  The patient, Courtney Terry, is a 56 y.o. female born on [birth year], is here for replacement ruptured left breast implant. She has a history right nipple sparing mastectomy with implant based reconstruction and left augmentation for symmetry.   FINDINGS: Capsule ingrowth noted beneath fill tab with partial loss volume implant. Mentor Smooth Round Moderate Profile 175 ml saline implant placed, fill volume 175 ml. REF [id number] SN [id number]  DESCRIPTION OF PROCEDURE:  The patient's operative site was marked with the patient in the preoperative area. The patient was taken to the operating room. SCDs were placed and IV antibiotics were given. The patient's operative site was prepped and draped in a sterile fashion. A time out was performed and all information was confirmed to be correct. Incision madein left inframammary fold scar. This was carried through superficial fascia to capsule. Prior implant removed with findings as above. Capsulotomies performed medially and superiorly with anterior scoring capsule. Sizer placed and incision tailor tacked closed. Patient brought to sitting position. Patient assessed for symmetry final moderate profile, round 175 ml for left was selected.  Patient was returned to supine position and cavity irrigated with solution containing Ancef, bacitracin, and gentamicin. Hemostasis ensured. Cavity then irrigated with Betadine. Final implant placed and filled to 175 ml. Fill tubing removed,  implant orientation ensured as well as fill tab closure. Closure completed with 3-0 vicryl to close superficial fascia. 4-0 vicryl used in dermis and skin closure completed with 4-0 monocryl subcuticular. Tissue adhesive applied followed by dry dressing and breast binder.   The patient was allowed to wake from anesthesia, extubated and taken to the recovery room in satisfactory condition.   SPECIMENS: none  DRAINS: none  Irene Limbo, MD [facility]astic [facility] [phone], pin [phone]

## 2017-09-19 NOTE — Anesthesia Preprocedure Evaluation (Signed)
Anesthesia Evaluation  Patient identified by MRN, date of birth, ID band Patient awake    Reviewed: Allergy & Precautions, H&P , NPO status , Patient's Chart, lab work & pertinent test results  Airway Mallampati: II   Neck ROM: full    Dental   Pulmonary shortness of breath, asthma , COPD, former smoker,    breath sounds clear to auscultation       Cardiovascular hypertension,  Rhythm:regular Rate:Normal     Neuro/Psych Seizures -,  PSYCHIATRIC DISORDERS Anxiety Depression Bipolar Disorder Schizophrenia    GI/Hepatic   Endo/Other  Hypothyroidism   Renal/GU      Musculoskeletal  (+) Arthritis ,   Abdominal   Peds  Hematology   Anesthesia Other Findings   Reproductive/Obstetrics                             Anesthesia Physical Anesthesia Plan  ASA: III  Anesthesia Plan: General   Post-op Pain Management:    Induction: Intravenous  PONV Risk Score and Plan: 3 and Ondansetron, Treatment may vary due to age or medical condition, Dexamethasone and Midazolam  Airway Management Planned: Oral ETT  Additional Equipment:   Intra-op Plan:   Post-operative Plan: Extubation in OR  Informed Consent: I have reviewed the patients History and Physical, chart, labs and discussed the procedure including the risks, benefits and alternatives for the proposed anesthesia with the patient or authorized representative who has indicated his/her understanding and acceptance.     Plan Discussed with: CRNA, Anesthesiologist and Surgeon  Anesthesia Plan Comments:         Anesthesia Quick Evaluation

## 2017-09-19 NOTE — Transfer of Care (Signed)
Immediate Anesthesia Transfer of Care Note  Patient: Courtney Terry  Procedure(s) Performed: PLACEMENT OF LEFT BREAST SALINE  IMPLANT (Left Breast)  Patient Location: PACU  Anesthesia Type:General  Level of Consciousness: lethargic and responds to stimulation  Airway & Oxygen Therapy: Patient Spontanous Breathing and Patient connected to face mask oxygen  Post-op Assessment: Report given to RN and Post -op Vital signs reviewed and stable  Post vital signs: Reviewed and stable  Last Vitals:  Vitals:   09/19/17 1115 09/19/17 1424  BP:  (!) (P) 149/89  Pulse:  (P) 91  Resp:  (P) 14  Temp:    SpO2: 95% (P) 100%    Last Pain:  Vitals:   09/19/17 1115  TempSrc:   PainSc: 0-No pain      Patients Stated Pain Goal: 3 (32/02/33 4356)  Complications: No apparent anesthesia complications

## 2017-09-19 NOTE — Anesthesia Procedure Notes (Signed)
Procedure Name: Intubation Date/Time: 09/19/2017 12:23 PM Performed by: Kimanh Templeman T, CRNA Pre-anesthesia Checklist: Patient identified, Emergency Drugs available, Suction available and Patient being monitored Patient Re-evaluated:Patient Re-evaluated prior to induction Oxygen Delivery Method: Circle system utilized Preoxygenation: Pre-oxygenation with 100% oxygen Induction Type: IV induction Ventilation: Mask ventilation without difficulty Laryngoscope Size: Miller and 3 Grade View: Grade I Tube type: Oral Tube size: 7.5 mm Number of attempts: 1 Airway Equipment and Method: Patient positioned with wedge pillow and Stylet Placement Confirmation: ETT inserted through vocal cords under direct vision,  positive ETCO2 and breath sounds checked- equal and bilateral Secured at: 22 cm Tube secured with: Tape Dental Injury: Teeth and Oropharynx as per pre-operative assessment

## 2017-09-19 NOTE — Significant Event (Signed)
Rapid Response Event Note  Overview: Time Called: 2159 Event Type: Neurologic  Initial Focused Assessment: Hypersomnolence   Interventions:   Plan of Care (if not transferred):  Event Summary: Called to evaluate patient for possible oversedation. History was reviewed with unit nurse. Patient was easy to arouse with light stimulus. Skin is warm and dry. Airway patent with diminished lung sounds. Heart sound regular. No signs of distress at present. Patient continues on sat monitor. No RRT interventions needed at present.   Courtney Terry [place]

## 2017-09-19 NOTE — H&P (Signed)
  Follow-up  7.5 months post op right implant replacement. Presents for replacement left breast implant, unclear when rutprue occurred, at least 6 weeks ago.   History significant for right NSM and TE reconstruction. Following implant exchange and left augmentation for symmetry, course complicated by gross exposure right breast implant and left breast MRSA infection, and underwent bilateral explant.   Represented with palpable mass that has been growing for 6 months. MMG/US 9 mm palpable mass in the 10 o'clock position of the right breast, 2 cm from the nipple, 6 mm intramammary node or cyst in the 10 o'clock position of the right breast, 1 cm from the nipple, No right axillary adenopathy. ER+/PR- Her 2 -. 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 Nodes negative. Oncotype 30 r. [facility] did not strongly recommend chemotherapy given pshycological issues and patient declined chemotherapy as well. On AI. Last MMG left  04/2016 benign.  History significant for diagnosis of right breast cancer 2013 and underwent SLN/lumpectomy. 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 ER+/PR- Her 2 neg. Oncotype score 25 at that time. Patient also referred to genetics and Rad Onc at that time. Patient states she was never told she needed radiation, also does not remember results of genetics test. Chart review states done at [facility] and was negative (no panel specified).   Prior 34 B. Mastectomy weight 113 g Last MMG 09/2017 normal.   COPD. Followed by [facility] for this. +Bipolar d/o, managed by PCP.     Objective:   Physical Exam  Cardiovascular: Normal rate, regular rhythm and normal heart sounds.   Pulmonary/Chest: Effort normal and breath sounds normal.   Chest: no masses bilateral, left implant deflation and fold implant protruding  Sn to nipple R 18 L 20 Nipple to IMF R 8 L 11   Assessment:     Right breast cancer - recurrent  Protein calorie malnutrition S/p NSM, TE  reconstruction (serratus fascia/subpectoral) S/p implant exchange right, left augmentation with saline S/p removal bilateral breast implants, MRSA culture from left breast cavity S/p re placement bilateral saline implants S/p implant exchange right for exposure S/p removal right breast implant S/p right latissimus, TE reconstruction  S/p removal expander and placement saline implant right  Plan:      Rupture left breast implant. Patient desires replacement. Significant weight fluctuation over course of surgeries, and not consistent follow up. Last visit discussed mastopexy but given this will plan implant replacement alone at this time. She has textured implant on left; given her poor follow up will plan switch to smooth saline.   Mentor Siltex Moderate Profile Saline implant.  LEFT 175 ml, fill volume 175 ml. RIGHT Mentor Smooth Round Moderate Profile Saline 225 ml implant placed, fill volume 250 ml. REF [place], MD [facility]astic [facility] Reconstruct[facility] Surgery [phone], pin [phone]

## 2017-09-20 ENCOUNTER — Encounter (HOSPITAL_COMMUNITY): Payer: Self-pay | Admitting: General Practice

## 2017-09-20 DIAGNOSIS — T8549XA Other mechanical complication of breast prosthesis and implant, initial encounter: Secondary | ICD-10-CM | POA: Diagnosis not present

## 2017-09-20 MED ORDER — CLINDAMYCIN HCL 300 MG PO CAPS: 300.0000 mg | ORAL_CAPSULE | Freq: Three times a day (TID) | ORAL | 0 refills | Status: AC

## 2017-09-20 MED ORDER — OXYCODONE-ACETAMINOPHEN 10-325 MG PO TABS: 1.0000 | ORAL_TABLET | ORAL | 0 refills | Status: DC | PRN

## 2017-09-20 NOTE — Plan of Care (Signed)
  Health Behavior/Discharge Planning: Ability to manage health-related needs will improve 09/20/2017 0930 - Adequate for Discharge by Governor Rooks, RN   Activity: Risk for activity intolerance will decrease 09/20/2017 0930 - Adequate for Discharge by Governor Rooks, RN

## 2017-09-20 NOTE — Discharge Summary (Signed)
Physician Discharge Summary  Patient ID: Courtney Terry MRN: [id number] DOB/AGE: Sep 08, 1961 56 y.o.  Admit date: 09/19/2017 Discharge date: 09/20/2017  Admission Diagnoses: History breast cancer, acquired absence breast, rupture left breast implant  Discharge Diagnoses:  same  Discharged Condition: stable  Hospital Course: Post operatively patient did well with pain controlled with oral medication. Patient maintained on her home O2 regimen overnight for history COPD. She tolerated diet and was ambulatory with minimal assist.  Treatments: surgery: removal and replacement left breast saline implant 12.14.18  Discharge Exam: Blood pressure 120/68, pulse 82, temperature 99 F (37.2 C), temperature source Oral, resp. rate 14, height 5\' 10"  (1.778 m), weight 56.7 kg (125 lb), SpO2 93 %. Incision/Wound: dry intact breast soft  Disposition: 01-Home or Self Care  Discharge Instructions    Call MD for:  redness, tenderness, or signs of infection (pain, swelling, bleeding, redness, odor or green/yellow discharge around incision site)   Complete by:  As directed    Call MD for:  temperature >100.5   Complete by:  As directed    Discharge instructions   Complete by:  As directed    Ok to remove dressings and shower am 12.16.18. Soap and water ok, pat incisions dry. No creams or ointments over incisions.   Breast binder or soft compression bra all other times.  Ok to raise arms above shoulders for bathing and dressing.  No house yard work or exercise until cleared by MD.   Driving Restrictions   Complete by:  As directed    No driving if taking narcotics   Lifting restrictions   Complete by:  As directed    No lifting > 5 lbs until cleared by MD   Resume previous diet   Complete by:  As directed      Allergies as of 09/20/2017      Reactions   Levaquin [levofloxacin] Hives      Medication List    TAKE these medications   albuterol (2.5 MG/3ML) 0.083% nebulizer  solution Commonly known as:  PROVENTIL TAKE 3 MLS BY NEBULIZATION EVERY 4 HOURS AS NEEDED FOR WHEEZING OR SHORTNESS OF BREATH   albuterol 108 (90 Base) MCG/ACT inhaler Commonly known as:  PROAIR HFA Inhale 2 puffs into the lungs every 4 (four) hours as needed for wheezing or shortness of breath.   amLODipine 5 MG tablet Commonly known as:  NORVASC Take 1 tablet (5 mg total) by mouth daily.   anastrozole 1 MG tablet Commonly known as:  ARIMIDEX TAKE 1 TABLET (1 MG TOTAL) BY MOUTH DAILY.   clindamycin 300 MG capsule Commonly known as:  CLEOCIN Take 1 capsule (300 mg total) by mouth 3 (three) times daily for 10 days.   divalproex 500 MG DR tablet Commonly known as:  DEPAKOTE Take 500 mg by mouth 2 (two) times daily.   feeding supplement (ENSURE ENLIVE) Liqd Take 237 mLs by mouth daily.   feeding supplement Liqd Take 1 Container by mouth daily.   FLUoxetine 20 MG capsule Commonly known as:  PROZAC Take 20 mg by mouth daily.   fluticasone 50 MCG/ACT nasal spray Commonly known as:  FLONASE Place 2 sprays into both nostrils daily.   Glycopyrrolate-Formoterol 9-4.8 MCG/ACT Aero Commonly known as:  BEVESPI AEROSPHERE Inhale 2 puffs into the lungs 2 (two) times daily.   levothyroxine 75 MCG tablet Commonly known as:  SYNTHROID, LEVOTHROID Take 1 tablet (75 mcg total) daily by mouth.   OLANZapine zydis 10 MG disintegrating tablet Commonly known  as:  ZYPREXA Take 10 mg by mouth every morning.   oxyCODONE-acetaminophen 10-325 MG tablet Commonly known as:  PERCOCET Take 1 tablet by mouth every 4 (four) hours as needed for pain.   OXYGEN Inhale 2 L into the lungs as needed.      Follow-up Information    Irene Limbo, MD Follow up in 1 week(s).   Specialty:  Plastic Surgery Why:  as scheduled Contact information: [address] [place] [address] [phone]           Signed: Irene Limbo 09/20/2017, 9:01 AM

## 2017-09-20 NOTE — Progress Notes (Signed)
Pt given discharge instructions and prescriptions. Instructions gone over with her and caregiver at bedside. Answered any questions to satisfactions. All belongings gathered and sent with caregiver. Pt in no distress at time of discharge.

## 2017-09-22 ENCOUNTER — Telehealth: Payer: Self-pay | Admitting: *Deleted

## 2017-09-22 NOTE — Telephone Encounter (Signed)
-----   Message from Truitt Merle, MD sent at 09/21/2017 11:37 AM EST ----- Please let pt know her DEXA result, and schedule her to see me in 1-2 months to discuss treatment options for her osteoporosis.  Thanks  Truitt Merle  09/21/2017

## 2017-09-22 NOTE — Telephone Encounter (Signed)
Left message for pt to return call regarding DEXA scan result.

## 2017-09-24 ENCOUNTER — Encounter (HOSPITAL_COMMUNITY): Payer: Self-pay | Admitting: Plastic Surgery

## 2017-09-24 NOTE — Anesthesia Postprocedure Evaluation (Signed)
Anesthesia Post Note  Patient: Courtney Terry  Procedure(s) Performed: PLACEMENT OF LEFT BREAST SALINE  IMPLANT (Left Breast)     Patient location during evaluation: PACU Anesthesia Type: General Level of consciousness: awake and alert Pain management: pain level controlled Vital Signs Assessment: post-procedure vital signs reviewed and stable Respiratory status: spontaneous breathing, nonlabored ventilation, respiratory function stable and patient connected to nasal cannula oxygen Cardiovascular status: blood pressure returned to baseline and stable Postop Assessment: no apparent nausea or vomiting Anesthetic complications: no    Last Vitals:  Vitals:   09/20/17 0508 09/20/17 0704  BP: 120/68   Pulse:    Resp: 14   Temp: 37.8 C 37.2 C  SpO2: 93%     Last Pain:  Vitals:   09/20/17 0704  TempSrc: Oral  PainSc:                  Vidette S

## 2017-10-01 ENCOUNTER — Telehealth: Payer: Self-pay | Admitting: *Deleted

## 2017-10-01 ENCOUNTER — Telehealth: Payer: Self-pay | Admitting: Hematology

## 2017-10-01 NOTE — Telephone Encounter (Signed)
-----   Message from Truitt Merle, MD sent at 09/21/2017 11:37 AM EST ----- Please let pt know her DEXA result, and schedule her to see me in 1-2 months to discuss treatment options for her osteoporosis.  Thanks  Truitt Merle  09/21/2017

## 2017-10-01 NOTE — Telephone Encounter (Signed)
Called pt & informed on DEXA report & need to be seen in 1-2 months do discuss options.  Suggested dietary calcium & Vit D supplements.  She expressed appreciation for call & would like to see Dr Burr Medico. Message to Schedulers to schedule.

## 2017-10-01 NOTE — Telephone Encounter (Signed)
Scheduled appt per 12/26 sch msg - spoke with patient regarding appts.  °

## 2017-10-06 ENCOUNTER — Telehealth: Payer: Self-pay | Admitting: Internal Medicine

## 2017-10-06 NOTE — Telephone Encounter (Signed)
Left voice mail on machine for patient to return phone call back regarding request for [place] sample. X1  Pt did not follow up with MW due for a f/u appt

## 2017-10-08 NOTE — Telephone Encounter (Signed)
lmtcb x2 for pt. 

## 2017-10-09 NOTE — Telephone Encounter (Signed)
Pt calling back [phone]

## 2017-10-09 NOTE — Telephone Encounter (Signed)
Spoke with patient today regarding sample of Bevespi. Advised patient we do not have any samples of Bevespi at this time.   Advised pt that she is needing a follow up appt with MW needed. Scheduled appt today with MW for 10/17/17 at 2:45pm.  Patient verbalized understanding and no further questions at this time.  Nothing further needed.

## 2017-10-09 NOTE — Telephone Encounter (Signed)
lmtcb x3 for pt. 

## 2017-10-23 ENCOUNTER — Telehealth: Payer: Self-pay | Admitting: Internal Medicine

## 2017-10-23 MED ORDER — GLYCOPYRROLATE-FORMOTEROL 9-4.8 MCG/ACT IN AERO: 2.0000 | INHALATION_SPRAY | Freq: Two times a day (BID) | RESPIRATORY_TRACT | 0 refills | Status: DC

## 2017-10-23 NOTE — Telephone Encounter (Signed)
lmtcb for pt. Samples of Bevespi left up front for pt. Pt needs to be scheduled for ROV with MW- must keep regular OV's to continue receiving meds.

## 2017-10-24 NOTE — Telephone Encounter (Signed)
lmtcb X2 for pt to make aware of samples.

## 2017-10-27 NOTE — Telephone Encounter (Signed)
Spoke with patient. She is aware of samples. Patient has been scheduled with MW for 11/12/17 at 430.   Nothing else needed at time of call.

## 2017-11-05 ENCOUNTER — Other Ambulatory Visit: Payer: Self-pay | Admitting: Hematology

## 2017-11-05 DIAGNOSIS — C50411 Malignant neoplasm of upper-outer quadrant of right female breast: Secondary | ICD-10-CM

## 2017-11-06 ENCOUNTER — Other Ambulatory Visit: Payer: Self-pay | Admitting: *Deleted

## 2017-11-06 DIAGNOSIS — C50411 Malignant neoplasm of upper-outer quadrant of right female breast: Secondary | ICD-10-CM

## 2017-11-06 MED ORDER — ANASTROZOLE 1 MG PO TABS: 1.0000 mg | ORAL_TABLET | Freq: Every day | ORAL | 2 refills | Status: DC

## 2017-11-11 ENCOUNTER — Encounter (HOSPITAL_COMMUNITY): Payer: Self-pay | Admitting: Emergency Medicine

## 2017-11-11 ENCOUNTER — Inpatient Hospital Stay (HOSPITAL_COMMUNITY)
Admission: EM | Admit: 2017-11-11 | Discharge: 2017-11-12 | DRG: 292 | Disposition: A | Discharge status: Home / Self Care | Payer: Medicaid Other | Attending: Internal Medicine | Admitting: Internal Medicine

## 2017-11-11 ENCOUNTER — Other Ambulatory Visit: Payer: Self-pay | Admitting: Pulmonary Disease

## 2017-11-11 ENCOUNTER — Emergency Department (HOSPITAL_COMMUNITY): Payer: Medicaid Other

## 2017-11-11 ENCOUNTER — Other Ambulatory Visit: Payer: Self-pay

## 2017-11-11 ENCOUNTER — Emergency Department (HOSPITAL_COMMUNITY)
Admit: 2017-11-11 | Discharge: 2017-11-11 | Disposition: A | Discharge status: Home / Self Care | Payer: Medicaid Other | Attending: Emergency Medicine | Admitting: Emergency Medicine

## 2017-11-11 ENCOUNTER — Inpatient Hospital Stay (HOSPITAL_COMMUNITY): Payer: Medicaid Other

## 2017-11-11 DIAGNOSIS — N730 Acute parametritis and pelvic cellulitis: Secondary | ICD-10-CM | POA: Diagnosis present

## 2017-11-11 DIAGNOSIS — I110 Hypertensive heart disease with heart failure: Secondary | ICD-10-CM | POA: Diagnosis present

## 2017-11-11 DIAGNOSIS — I361 Nonrheumatic tricuspid (valve) insufficiency: Secondary | ICD-10-CM | POA: Diagnosis not present

## 2017-11-11 DIAGNOSIS — R609 Edema, unspecified: Secondary | ICD-10-CM

## 2017-11-11 DIAGNOSIS — Z79811 Long term (current) use of aromatase inhibitors: Secondary | ICD-10-CM | POA: Diagnosis not present

## 2017-11-11 DIAGNOSIS — Z9010 Acquired absence of unspecified breast and nipple: Secondary | ICD-10-CM | POA: Diagnosis not present

## 2017-11-11 DIAGNOSIS — I509 Heart failure, unspecified: Secondary | ICD-10-CM

## 2017-11-11 DIAGNOSIS — Z9981 Dependence on supplemental oxygen: Secondary | ICD-10-CM

## 2017-11-11 DIAGNOSIS — G40909 Epilepsy, unspecified, not intractable, without status epilepticus: Secondary | ICD-10-CM | POA: Diagnosis present

## 2017-11-11 DIAGNOSIS — Z9882 Breast implant status: Secondary | ICD-10-CM | POA: Diagnosis not present

## 2017-11-11 DIAGNOSIS — I5033 Acute on chronic diastolic (congestive) heart failure: Secondary | ICD-10-CM | POA: Diagnosis present

## 2017-11-11 DIAGNOSIS — J9611 Chronic respiratory failure with hypoxia: Secondary | ICD-10-CM | POA: Diagnosis not present

## 2017-11-11 DIAGNOSIS — R601 Generalized edema: Secondary | ICD-10-CM | POA: Insufficient documentation

## 2017-11-11 DIAGNOSIS — Z79899 Other long term (current) drug therapy: Secondary | ICD-10-CM | POA: Diagnosis not present

## 2017-11-11 DIAGNOSIS — R569 Unspecified convulsions: Secondary | ICD-10-CM | POA: Insufficient documentation

## 2017-11-11 DIAGNOSIS — F419 Anxiety disorder, unspecified: Secondary | ICD-10-CM | POA: Diagnosis present

## 2017-11-11 DIAGNOSIS — E039 Hypothyroidism, unspecified: Secondary | ICD-10-CM | POA: Diagnosis present

## 2017-11-11 DIAGNOSIS — I10 Essential (primary) hypertension: Secondary | ICD-10-CM | POA: Diagnosis present

## 2017-11-11 DIAGNOSIS — Z9119 Patient's noncompliance with other medical treatment and regimen: Secondary | ICD-10-CM

## 2017-11-11 DIAGNOSIS — F209 Schizophrenia, unspecified: Secondary | ICD-10-CM | POA: Diagnosis present

## 2017-11-11 DIAGNOSIS — M1990 Unspecified osteoarthritis, unspecified site: Secondary | ICD-10-CM | POA: Diagnosis present

## 2017-11-11 DIAGNOSIS — J449 Chronic obstructive pulmonary disease, unspecified: Secondary | ICD-10-CM | POA: Diagnosis present

## 2017-11-11 DIAGNOSIS — Z91410 Personal history of adult physical and sexual abuse: Secondary | ICD-10-CM | POA: Diagnosis not present

## 2017-11-11 DIAGNOSIS — F4310 Post-traumatic stress disorder, unspecified: Secondary | ICD-10-CM | POA: Diagnosis present

## 2017-11-11 DIAGNOSIS — F1910 Other psychoactive substance abuse, uncomplicated: Secondary | ICD-10-CM | POA: Diagnosis not present

## 2017-11-11 DIAGNOSIS — F17210 Nicotine dependence, cigarettes, uncomplicated: Secondary | ICD-10-CM | POA: Diagnosis present

## 2017-11-11 DIAGNOSIS — F319 Bipolar disorder, unspecified: Secondary | ICD-10-CM | POA: Diagnosis present

## 2017-11-11 DIAGNOSIS — Z881 Allergy status to other antibiotic agents status: Secondary | ICD-10-CM | POA: Diagnosis not present

## 2017-11-11 DIAGNOSIS — Z853 Personal history of malignant neoplasm of breast: Secondary | ICD-10-CM | POA: Diagnosis not present

## 2017-11-11 LAB — CBC
HCT: 43.9 % (ref 36.0–46.0)
HCT: 48.5 % — ABNORMAL HIGH (ref 36.0–46.0)
Hemoglobin: 13.5 g/dL (ref 12.0–15.0)
Hemoglobin: 14.3 g/dL (ref 12.0–15.0)
MCH: 25.6 pg — AB (ref 26.0–34.0)
MCH: 25.2 pg — ABNORMAL LOW (ref 26.0–34.0)
MCHC: 30.8 g/dL (ref 30.0–36.0)
MCHC: 29.5 g/dL — ABNORMAL LOW (ref 30.0–36.0)
MCV: 83.3 fL (ref 78.0–100.0)
MCV: 85.4 fL (ref 78.0–100.0)
PLATELETS: 220 10*3/uL (ref 150–400)
Platelets: 250 K/uL (ref 150–400)
RBC: 5.27 MIL/uL — AB (ref 3.87–5.11)
RBC: 5.68 MIL/uL — ABNORMAL HIGH (ref 3.87–5.11)
RDW: 18.6 % — AB (ref 11.5–15.5)
RDW: 18.4 % — ABNORMAL HIGH (ref 11.5–15.5)
WBC: 5.6 10*3/uL (ref 4.0–10.5)
WBC: 5.8 K/uL (ref 4.0–10.5)

## 2017-11-11 LAB — ECHOCARDIOGRAM COMPLETE
Height: 73 in
Weight: 2240 [oz_av]

## 2017-11-11 LAB — URINALYSIS, ROUTINE W REFLEX MICROSCOPIC
BILIRUBIN URINE: NEGATIVE
Bacteria, UA: NONE SEEN
GLUCOSE, UA: NEGATIVE mg/dL
Hgb urine dipstick: NEGATIVE
KETONES UR: NEGATIVE mg/dL
Nitrite: NEGATIVE
PH: 6 (ref 5.0–8.0)
PROTEIN: NEGATIVE mg/dL
SQUAMOUS EPITHELIAL / LPF: NONE SEEN
Specific Gravity, Urine: 1.016 (ref 1.005–1.030)

## 2017-11-11 LAB — BASIC METABOLIC PANEL
Anion gap: 7 (ref 5–15)
BUN: 20 mg/dL (ref 6–20)
CALCIUM: 8.2 mg/dL — AB (ref 8.9–10.3)
CO2: 31 mmol/L (ref 22–32)
CREATININE: 0.61 mg/dL (ref 0.44–1.00)
Chloride: 101 mmol/L (ref 101–111)
GFR calc Af Amer: >60 mL/min (ref >60)
GLUCOSE: 77 mg/dL (ref 65–99)
Potassium: 4.4 mmol/L (ref 3.5–5.1)
Sodium: 139 mmol/L (ref 135–145)

## 2017-11-11 LAB — RAPID URINE DRUG SCREEN, HOSP PERFORMED
Amphetamines: NOT DETECTED
BARBITURATES: NOT DETECTED
Benzodiazepines: NOT DETECTED
Cocaine: POSITIVE — AB
Opiates: NOT DETECTED
Tetrahydrocannabinol: POSITIVE — AB

## 2017-11-11 LAB — CREATININE, SERUM
Creatinine, Ser: 0.64 mg/dL (ref 0.44–1.00)
GFR calc Af Amer: >60 mL/min
GFR calc non Af Amer: >60 mL/min

## 2017-11-11 LAB — WET PREP, GENITAL
CLUE CELLS WET PREP: NONE SEEN
Sperm: NONE SEEN
Trich, Wet Prep: NONE SEEN
Yeast Wet Prep HPF POC: NONE SEEN

## 2017-11-11 LAB — I-STAT TROPONIN, ED: Troponin i, poc: 0.01 ng/mL (ref 0.00–0.08)

## 2017-11-11 LAB — TROPONIN I: Troponin I: <0.03 ng/mL

## 2017-11-11 LAB — BRAIN NATRIURETIC PEPTIDE: B NATRIURETIC PEPTIDE 5: 1048.2 pg/mL — AB (ref 0.0–100.0)

## 2017-11-11 MED ORDER — DOXYCYCLINE HYCLATE 100 MG PO TABS
100.0000 mg | ORAL_TABLET | Freq: Once | ORAL | Status: AC
  Administered 2017-11-11: 100 mg via ORAL
  Filled 2017-11-11: qty 1

## 2017-11-11 MED ORDER — OLANZAPINE 10 MG PO TABS
20.0000 mg | ORAL_TABLET | Freq: Every day | ORAL | Status: DC
  Administered 2017-11-11: 20 mg via ORAL
  Filled 2017-11-11: qty 2

## 2017-11-11 MED ORDER — POTASSIUM CHLORIDE CRYS ER 20 MEQ PO TBCR
20.0000 meq | EXTENDED_RELEASE_TABLET | Freq: Every day | ORAL | Status: DC
  Administered 2017-11-11 – 2017-11-12 (×2): 20 meq via ORAL
  Filled 2017-11-11 (×2): qty 1

## 2017-11-11 MED ORDER — LISINOPRIL 5 MG PO TABS
2.5000 mg | ORAL_TABLET | Freq: Every day | ORAL | Status: DC
  Administered 2017-11-11 – 2017-11-12 (×2): 2.5 mg via ORAL
  Filled 2017-11-11 (×2): qty 1

## 2017-11-11 MED ORDER — SODIUM CHLORIDE 0.9% FLUSH
3.0000 mL | Freq: Two times a day (BID) | INTRAVENOUS | Status: DC
  Administered 2017-11-11 – 2017-11-12 (×3): 3 mL via INTRAVENOUS

## 2017-11-11 MED ORDER — ALBUTEROL SULFATE (2.5 MG/3ML) 0.083% IN NEBU
2.5000 mg | INHALATION_SOLUTION | RESPIRATORY_TRACT | Status: DC | PRN
Start: 2017-11-11 — End: 2017-11-12

## 2017-11-11 MED ORDER — DIVALPROEX SODIUM 500 MG PO DR TAB
500.0000 mg | DELAYED_RELEASE_TABLET | Freq: Two times a day (BID) | ORAL | Status: DC
  Administered 2017-11-11: 500 mg via ORAL
  Filled 2017-11-11 (×3): qty 1

## 2017-11-11 MED ORDER — DOXYCYCLINE HYCLATE 100 MG PO TABS
100.0000 mg | ORAL_TABLET | Freq: Two times a day (BID) | ORAL | Status: DC
  Administered 2017-11-11 – 2017-11-12 (×2): 100 mg via ORAL
  Filled 2017-11-11 (×2): qty 1

## 2017-11-11 MED ORDER — ONDANSETRON HCL 4 MG/2ML IJ SOLN: 4.0000 mg | Freq: Four times a day (QID) | INTRAMUSCULAR | Status: DC | PRN

## 2017-11-11 MED ORDER — ENOXAPARIN SODIUM 40 MG/0.4ML ~~LOC~~ SOLN
40.0000 mg | SUBCUTANEOUS | Status: DC
  Administered 2017-11-11: 40 mg via SUBCUTANEOUS
  Filled 2017-11-11: qty 0.4

## 2017-11-11 MED ORDER — ACETAMINOPHEN 325 MG PO TABS: 650.0000 mg | ORAL_TABLET | ORAL | Status: DC | PRN

## 2017-11-11 MED ORDER — ASPIRIN EC 81 MG PO TBEC
81.0000 mg | DELAYED_RELEASE_TABLET | Freq: Every day | ORAL | Status: DC
  Administered 2017-11-11 – 2017-11-12 (×2): 81 mg via ORAL
  Filled 2017-11-11 (×2): qty 1

## 2017-11-11 MED ORDER — IPRATROPIUM-ALBUTEROL 0.5-2.5 (3) MG/3ML IN SOLN: 3.0000 mL | Freq: Once | RESPIRATORY_TRACT | Status: DC

## 2017-11-11 MED ORDER — LIDOCAINE HCL 1 % IJ SOLN
INTRAMUSCULAR | Status: AC
  Administered 2017-11-11: 20 mL
  Filled 2017-11-11: qty 20

## 2017-11-11 MED ORDER — CEFTRIAXONE SODIUM 250 MG IJ SOLR
250.0000 mg | Freq: Once | INTRAMUSCULAR | Status: AC
  Administered 2017-11-11: 250 mg via INTRAMUSCULAR
  Filled 2017-11-11: qty 250

## 2017-11-11 MED ORDER — SODIUM CHLORIDE 0.9% FLUSH: 3.0000 mL | INTRAVENOUS | Status: DC | PRN

## 2017-11-11 MED ORDER — FUROSEMIDE 10 MG/ML IJ SOLN: 40.0000 mg | Freq: Every day | INTRAMUSCULAR | Status: DC

## 2017-11-11 MED ORDER — FUROSEMIDE 10 MG/ML IJ SOLN
40.0000 mg | Freq: Once | INTRAMUSCULAR | Status: AC
  Administered 2017-11-11: 40 mg via INTRAVENOUS
  Filled 2017-11-11: qty 4

## 2017-11-11 MED ORDER — SODIUM CHLORIDE 0.9 % IV SOLN: 250.0000 mL | INTRAVENOUS | Status: DC | PRN

## 2017-11-11 MED ORDER — LEVOTHYROXINE SODIUM 75 MCG PO TABS
75.0000 ug | ORAL_TABLET | Freq: Every day | ORAL | Status: DC
  Administered 2017-11-12: 75 ug via ORAL
  Filled 2017-11-11 (×3): qty 1

## 2017-11-11 MED ORDER — ANASTROZOLE 1 MG PO TABS
1.0000 mg | ORAL_TABLET | Freq: Every day | ORAL | Status: DC
  Administered 2017-11-11 – 2017-11-12 (×2): 1 mg via ORAL
  Filled 2017-11-11 (×2): qty 1
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
## 2017-11-11 NOTE — ED Triage Notes (Signed)
Per EMS-B/L lower extremity edema for 1 month-getting worse-patient states edema is painful-states she ran out of her oxygen last night-BP 100/70, HR 104, RR 20, 100% on 2L [place]

## 2017-11-11 NOTE — ED Notes (Signed)
Bed: JO87 Expected date:  Expected time:  Means of arrival:  Comments: Hold FT 5
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
## 2017-11-11 NOTE — Progress Notes (Signed)
Bilateral lower extremity venous duplex has been completed. Negative for DVT. Results were given to [facility] PA.  11/11/17 11:08 AM Courtney Terry RVT

## 2017-11-11 NOTE — ED Notes (Signed)
Patient transported to X-ray 

## 2017-11-11 NOTE — ED Notes (Signed)
Doppler at bedside.

## 2017-11-11 NOTE — ED Notes (Signed)
Bed: WTR5 Expected date:  Expected time:  Means of arrival:  Comments: 

## 2017-11-11 NOTE — Progress Notes (Signed)
*  PRELIMINARY RESULTS* Echocardiogram 2D Echocardiogram has been performed.  Courtney Terry 11/11/2017, 4:06 PM
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
## 2017-11-12 ENCOUNTER — Inpatient Hospital Stay (HOSPITAL_COMMUNITY): Payer: Medicaid Other

## 2017-11-12 ENCOUNTER — Encounter (HOSPITAL_COMMUNITY): Payer: Self-pay | Admitting: Radiology

## 2017-11-12 ENCOUNTER — Ambulatory Visit: Payer: Medicaid Other | Admitting: Internal Medicine

## 2017-11-12 DIAGNOSIS — I509 Heart failure, unspecified: Secondary | ICD-10-CM

## 2017-11-12 LAB — BASIC METABOLIC PANEL
ANION GAP: 8 (ref 5–15)
BUN: 18 mg/dL (ref 6–20)
CHLORIDE: 97 mmol/L — AB (ref 101–111)
CO2: 36 mmol/L — AB (ref 22–32)
Calcium: 8.3 mg/dL — ABNORMAL LOW (ref 8.9–10.3)
Creatinine, Ser: 0.61 mg/dL (ref 0.44–1.00)
GFR calc Af Amer: >60 mL/min (ref >60)
GFR calc non Af Amer: >60 mL/min (ref >60)
GLUCOSE: 106 mg/dL — AB (ref 65–99)
POTASSIUM: 4.1 mmol/L (ref 3.5–5.1)
Sodium: 141 mmol/L (ref 135–145)

## 2017-11-12 LAB — GC/CHLAMYDIA PROBE AMP (~~LOC~~) NOT AT ARMC
CHLAMYDIA, DNA PROBE: NEGATIVE
NEISSERIA GONORRHEA: NEGATIVE

## 2017-11-12 LAB — TROPONIN I: Troponin I: <0.03 ng/mL (ref <0.03)

## 2017-11-12 LAB — RPR: RPR Ser Ql: NONREACTIVE

## 2017-11-12 MED ORDER — IOPAMIDOL (ISOVUE-370) INJECTION 76%
100.0000 mL | Freq: Once | INTRAVENOUS | Status: AC | PRN
  Administered 2017-11-12: 100 mL via INTRAVENOUS

## 2017-11-12 MED ORDER — IOPAMIDOL (ISOVUE-370) INJECTION 76%
INTRAVENOUS | Status: AC
  Filled 2017-11-12: qty 100

## 2017-11-12 MED ORDER — DOXYCYCLINE HYCLATE 100 MG PO TABS: 100.0000 mg | ORAL_TABLET | Freq: Two times a day (BID) | ORAL | 0 refills | Status: DC

## 2017-11-12 MED ORDER — SODIUM CHLORIDE 0.9 % IJ SOLN
INTRAMUSCULAR | Status: AC
  Filled 2017-11-12: qty 50

## 2017-11-12 MED ORDER — FUROSEMIDE 10 MG/ML IJ SOLN
40.0000 mg | Freq: Four times a day (QID) | INTRAMUSCULAR | Status: DC
  Administered 2017-11-12: 40 mg via INTRAVENOUS
  Filled 2017-11-12: qty 4

## 2017-11-12 MED ORDER — FUROSEMIDE 40 MG PO TABS: 40.0000 mg | ORAL_TABLET | Freq: Every day | ORAL | 1 refills | Status: DC
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
## 2017-11-12 NOTE — Progress Notes (Signed)
This CM was alerted by nursing that pt states she does not have a 02 tank for home and that she doesn't have a concentrator either. This CM alerted [facility] rep of need for 02. Marney Doctor RN,BSN,NCM [phone]

## 2017-11-13 LAB — HIV ANTIBODY (ROUTINE TESTING W REFLEX): HIV SCREEN 4TH GENERATION: NONREACTIVE

## 2017-11-17 ENCOUNTER — Ambulatory Visit: Payer: Medicaid Other | Admitting: Hematology

## 2017-11-26 ENCOUNTER — Telehealth: Payer: Self-pay | Admitting: Internal Medicine

## 2017-11-26 MED ORDER — ALBUTEROL SULFATE HFA 108 (90 BASE) MCG/ACT IN AERS: 2.0000 | INHALATION_SPRAY | RESPIRATORY_TRACT | 2 refills | Status: DC | PRN

## 2017-11-26 MED ORDER — GLYCOPYRROLATE-FORMOTEROL 9-4.8 MCG/ACT IN AERO: 2.0000 | INHALATION_SPRAY | Freq: Two times a day (BID) | RESPIRATORY_TRACT | 0 refills | Status: DC

## 2017-11-26 NOTE — Telephone Encounter (Signed)
Called pt letting her know we had some samples of bevespi but we do not receive samples of albuterol.  Sent rx of albuterol to her preferred pharmacy.  Placing only one sample of bevespi up front for pt to pick up.  Pt has not been here since 10/30/16 but does have a f/u appt scheduled with MW 12/02/17.  If pt shows up for that visit, we could give her more samples of bevespi at that visit.  Placing sample up front. Nothing further needed at this current time.

## 2017-12-02 ENCOUNTER — Ambulatory Visit: Payer: Medicaid Other | Admitting: Internal Medicine

## 2017-12-03 ENCOUNTER — Telehealth: Payer: Self-pay | Admitting: Internal Medicine

## 2017-12-03 MED ORDER — GLYCOPYRROLATE-FORMOTEROL 9-4.8 MCG/ACT IN AERO: 2.0000 | INHALATION_SPRAY | Freq: Two times a day (BID) | RESPIRATORY_TRACT | 0 refills | Status: DC

## 2017-12-03 MED ORDER — ALBUTEROL SULFATE HFA 108 (90 BASE) MCG/ACT IN AERS: 2.0000 | INHALATION_SPRAY | RESPIRATORY_TRACT | 0 refills | Status: DC | PRN

## 2017-12-03 NOTE — Telephone Encounter (Signed)
Pt requesting 3 month supply of bevespi and an albuterol inhaler refill.  This has been sent to preferred pharmacy.  Nothing further needed.

## 2017-12-11 ENCOUNTER — Ambulatory Visit: Payer: Medicaid Other | Admitting: Internal Medicine

## 2017-12-15 ENCOUNTER — Telehealth: Payer: Self-pay | Admitting: Internal Medicine

## 2017-12-15 NOTE — Telephone Encounter (Signed)
Called and spoke with patient, advised her that we do have samples and I placed one up front for her when she can pick it up.

## 2017-12-22 ENCOUNTER — Telehealth: Payer: Self-pay | Admitting: Internal Medicine

## 2017-12-22 NOTE — Telephone Encounter (Signed)
Patient has been advised that the samples have been placed up front.

## 2017-12-24 ENCOUNTER — Other Ambulatory Visit: Payer: Self-pay | Admitting: Internal Medicine

## 2017-12-26 ENCOUNTER — Telehealth: Payer: Self-pay | Admitting: Internal Medicine

## 2017-12-26 NOTE — Telephone Encounter (Signed)
Called and spoke with patient advised patient that it was placed up front and per the sample pick up book it was picked up on 3.18.19 by her Education officer, museum. Patient verbalized understanding and nothing further was needed.

## 2017-12-30 ENCOUNTER — Ambulatory Visit (INDEPENDENT_AMBULATORY_CARE_PROVIDER_SITE_OTHER): Payer: Medicaid Other | Admitting: Internal Medicine

## 2017-12-30 ENCOUNTER — Other Ambulatory Visit: Payer: Self-pay | Admitting: Internal Medicine

## 2017-12-30 ENCOUNTER — Encounter: Payer: Self-pay | Admitting: Internal Medicine

## 2017-12-30 VITALS — BP 114/80 | HR 94 | Temp 98.1°F | Ht 70.0 in | Wt 112.6 lb

## 2017-12-30 DIAGNOSIS — J449 Chronic obstructive pulmonary disease, unspecified: Secondary | ICD-10-CM

## 2017-12-30 DIAGNOSIS — J9611 Chronic respiratory failure with hypoxia: Secondary | ICD-10-CM | POA: Diagnosis not present

## 2017-12-30 MED ORDER — OMEPRAZOLE 40 MG PO CPDR: DELAYED_RELEASE_CAPSULE | ORAL | 11 refills | Status: DC

## 2017-12-30 MED ORDER — AZITHROMYCIN 250 MG PO TABS: ORAL_TABLET | ORAL | 0 refills | Status: DC

## 2017-12-30 MED ORDER — GLYCOPYRROLATE-FORMOTEROL 9-4.8 MCG/ACT IN AERO: 2.0000 | INHALATION_SPRAY | Freq: Two times a day (BID) | RESPIRATORY_TRACT | 0 refills | Status: DC

## 2017-12-30 MED ORDER — FAMOTIDINE 20 MG PO TABS: ORAL_TABLET | ORAL | 11 refills | Status: DC

## 2017-12-30 NOTE — Progress Notes (Signed)
Subjective:     Patient ID: Courtney Terry, female   DOB: [birth year] MRN: [id number]    Brief patient profile:  76 yobf says quit smoking 09/2016  with new sob 2014 referred to pulmonary clinic 02/26/2013 for ? Copd by [facility]hospitalists. Sees NP  Donley Redder  with GOLD IV copd by pft's 04/15/13   HPI 02/26/2013 1st pulmonary ov/ Senai Ramnath Chief Complaint  Patient presents with  . Acute Visit    Pt c/o increased SOB for the past wk. She states gets SOB just walking short distances such as from lobby to exam room today and also sometimes gets SOB at rest. She has also noticed increased cough and wheezing. Cough is prod with moderate yellow to clear sputum.   indolent onset, variable sob activity and at rest x 3 months better on prednisone with apparent over use of saba at baseline with more day than night time symptoms. Transiently better p saba rec The key is to stop smoking completely before smoking completely stops you!  Prednisone 10 mg take  4 each am x 2 days,   2 each am x 2 days,  1 each am x2days and stop  Plan A= automatic = dulera 200 Take 2 puffs first thing in am and then another 2 puffs about 12 hours later.  Plan B = Backup = Only use your albuterol (red inhaler = proaire) as a rescue medication     06/03/13  Urine POS cocaine pt denied use   08/16/13 ER note admitted to ER Doctor use of crack cocaine  03/29/2016  Transition of care post hosp f/u ov/Aundria Bitterman re: copd ? Gold IV / still smoking some/ no rx  Chief Complaint  Patient presents with  . Follow-up    Pt c/o breathing progressively worse since the last visit here in 2015. She states that she is SOB "all the time" with or without exertion.   last smoked anything 2 days ago but no longer buying any and denies elicit drug use Has no medication at all for  breathing, says "they won't give it to me s seeing you first (not true) " Has 02 just uses 2lpm at hs  [facility] = can't walk 100 yards even at a slow pace at a flat grade s  stopping due to sob  eg rides scooter at grocery store / 02 doesn't help ex tol  rec Please remember to go to the lab  department downstairs for your tests - we will call you with the results when they are available. try BEVESPI Take 2 puffs first thing in am and then another 2 puffs about 12 hours later.  Only use your albuterol(Proair) as a rescue medication  Work on inhaler technique:  relax and gently blow all the way out then take a nice smooth deep breath back in, triggering the inhaler at same time you start breathing in.  Hold for up to 5 seconds if you can. Blow out thru nose. Rinse and gargle with water when done GERD diet Please schedule a follow up office visit in 4 weeks, sooner if needed> DID NOT RETURN> freq admits       10/30/2016  Extended f/u ov/Shelbee Apgar re: transition of care, copd GOLD IV with freq admits  Chief Complaint  Patient presents with  . Hospitalization Follow-up    Pt states today is a good day for her breathing.    no decline in activity tol  since pred completed a week prior to OV   No  longer smoking Use avg proair 1 pff at 4pm only / doesn't know maint vs prn or have any concept of action plan - see a/p  rec Your appt with with the cancer doctor is Jan 29th at 2:45  Work on inhaler technique:  Congratulations on not smoking, it's the most important aspect of your care  Plan A = Automatic = Bevespi Take 2 puffs first thing in am and then another 2 puffs about 12 hours later.  Plan B = Backup Only use your albuterol (proair) as a rescue medication  Plan C = Crisis - only use your albuterol nebulizer if you first try Plan B and it fails to help > ok to use the nebulizer up to every 4 hours but if start needing it regularly call for immediate appointment Plan D = Doctor -  if B and C not adequate for appt right to see or Tammy NP  Please schedule a follow up office visit in 4 weeks, sooner if needed to see Tammy NP with all medications/ inhalers/ neb solutions  in hand > never did     12/30/2017  f/u ov/Jarriel Papillion re:  Copd  GOLD IV on prn 02 / no meds  Chief Complaint  Patient presents with  . Follow-up    Increased SOB over the past 3-4 wks. She has been getting samples of Bevespi but ran out a few days ago.  She states she ran out of her albuterol inhaler 2 days ago. She had been using it several times per day. She has had prod cough with green to yellow sputum.    Dyspnea:  Was better on bevespi 2 each am and uses 3rd/4th dose about 9 hours later  Cough:  Occurs  Sleep: ok resp wise  SABA use:  Alb around 6 pm and 11 pm   No obvious day to day or daytime variability or assoc excess/ purulent sputum or mucus plugs or hemoptysis or cp or chest tightness, subjective wheeze or overt sinus or hb symptoms. No unusual exposure hx or h/o childhood pna/ asthma or knowledge of premature birth.  Sleeping ok flat without nocturnal  or early am exacerbation  of respiratory  c/o's or need for noct saba. Also denies any obvious fluctuation of symptoms with weather or environmental changes or other aggravating or alleviating factors except as outlined above   Current Allergies, Complete Past Medical History, Past Surgical History, Family History, and Social History were reviewed in [employer] record.  ROS  The following are not active complaints unless bolded Hoarseness, sore throat, dysphagia, dental problems, itching, sneezing,  nasal congestion or discharge of excess mucus or purulent secretions, ear ache,   fever, chills, sweats, unintended wt loss or wt gain, classically pleuritic or exertional cp,  orthopnea pnd or leg swelling, presyncope, palpitations, abdominal pain, anorexia, nausea, vomiting, diarrhea  or change in bowel habits or change in bladder habits, change in stools or change in urine, dysuria, hematuria,  rash, arthralgias, visual complaints, headache, numbness, weakness or ataxia or problems with walking or coordination,   change in mood/affect or memory.        Current Meds - not able to verify   Medication Sig  . albuterol (PROVENTIL) (2.5 MG/3ML) 0.083% nebulizer solution TAKE 3 MLS BY NEBULIZATION EVERY 4 HOURS AS NEEDED FOR WHEEZING OR SHORTNESS OF BREATH  . amLODipine (NORVASC) 5 MG tablet Take 1 tablet (5 mg total) by mouth daily.  [name] Kitchen anastrozole (ARIMIDEX) 1 MG tablet Take  1 tablet (1 mg total) by mouth daily.  . divalproex (DEPAKOTE) 500 MG DR tablet Take 500 mg by mouth 2 (two) times daily.   . feeding supplement (BOOST / RESOURCE BREEZE) LIQD Take 1 Container by mouth daily.  . feeding supplement, ENSURE ENLIVE, (ENSURE ENLIVE) LIQD Take 237 mLs by mouth daily.  [name] Kitchen FLUoxetine (PROZAC) 20 MG capsule Take 20 mg by mouth daily.  . furosemide (LASIX) 40 MG tablet Take 1 tablet (40 mg total) by mouth daily.  . Glycopyrrolate-Formoterol (BEVESPI AEROSPHERE) 9-4.8 MCG/ACT AERO Inhale 2 puffs into the lungs 2 (two) times daily.  [name] Kitchen levothyroxine (SYNTHROID, LEVOTHROID) 75 MCG tablet Take 1 tablet (75 mcg total) daily by mouth.  . OLANZapine (ZYPREXA) 20 MG tablet Take 20 mg by mouth at bedtime.   . OXYGEN Inhale 2 L into the lungs as needed.   . [DISCONTINUED] Glycopyrrolate-Formoterol (BEVESPI AEROSPHERE) 9-4.8 MCG/ACT AERO Inhale 2 puffs into the lungs 2 (two) times daily.                   Objective:   Physical Exam   04/15/2013   134  Vs 142  05/28/13 > 08/12/2013 144 > 08/26/2013  145 > 147  10/18/2013  >137 07/14/2014 > 09/05/2014 142 > 03/29/2016   110  > 10/30/2016  128 > 12/30/2017  112   amb thin bf nad  Vital signs reviewed - Note on arrival 02 sats  91% on RA     HEENT: nl  oropharynx. Nl external ear canals without cough reflex - edentulous/ mild bilateral non-specific turbinate edema     NECK :  without JVD/Nodes/TM/ nl carotid upstrokes bilaterally   LUNGS: no acc muscle use,  Mod barrel  contour chest wall with bilateral  Distant bs s audible wheeze and  without cough on insp or  exp maneuver and mod   Hyperresonant  to  percussion bilaterally     CV:  RRR  no s3 or murmur or increase in P2, and no edema   ABD:  soft and nontender with pos mid insp Hoover's  in the supine position. No bruits or organomegaly appreciated, bowel sounds nl  MS:   Nl gait/  ext warm without deformities, calf tenderness, cyanosis or clubbing No obvious joint restrictions   SKIN: warm and dry without lesions    NEURO:  alert, approp, nl sensorium with  no motor or cerebellar deficits apparent.               I personally reviewed images and agree with radiology impression as follows:   Chest CTa 11/12/17 No evidence of pulmonary embolus. Scattered predominantly subpleural nodules in the lungs.

## 2017-12-30 NOTE — Patient Instructions (Addendum)
Omeprazole 40 mg   Take  30-60 min before first meal of the day and Pepcid (famotidine)  20 mg one @  bedtime until return to office - this is the best way to tell whether stomach acid is contributing to your problem.    zpak  Plan A = Automatic = Bevespi Take 2 puffs first thing in am and then another 2 puffs about 12 hours later.   Work on inhaler technique:  relax and gently blow all the way out then take a nice smooth deep breath back in, triggering the inhaler at same time you start breathing in.  Hold for up to 5 seconds if you can. Blow out thru nose. Rinse and gargle with water when done      Plan B = Backup Only use your albuterol as a rescue medication to be used if you can't catch your breath by resting or doing a relaxed purse lip breathing pattern.  - The less you use it, the better it will work when you need it. - Ok to use the inhaler up to 2 puffs  every 4 hours if you must but call for appointment if use goes up over your usual need - Don't leave home without it !!  (think of it like the spare tire for your car)   Plan C = Crisis - only use your albuterol nebulizer if you first try Plan B and it fails to help > ok to use the nebulizer up to every 4 hours but if start needing it regularly call for immediate appointment   Please schedule a follow up office visit in 4 weeks, sooner if needed  with all medications /inhalers/ solutions in hand so we can verify exactly what you are taking. This includes all medications from all doctors and over the counters

## 2018-01-02 ENCOUNTER — Other Ambulatory Visit: Payer: Self-pay | Admitting: Hematology

## 2018-01-02 DIAGNOSIS — C50411 Malignant neoplasm of upper-outer quadrant of right female breast: Secondary | ICD-10-CM

## 2018-01-04 ENCOUNTER — Encounter: Payer: Self-pay | Admitting: Internal Medicine

## 2018-01-04 NOTE — Assessment & Plan Note (Signed)
02 2lpm hs since admit 02/2016 - 03/29/2016   Walked RA  2 laps @ 185 ft each at slow pace stopped due to  Sob/ sats 88%    as of 12/30/2017  rx 2lpm and prn daytime

## 2018-01-04 NOTE — Assessment & Plan Note (Addendum)
-   PFT's 04/15/13 FEV1  0.56 (18%) ratio 37 and 27% better p B2  DLCO 39 corrects to 60 - added tudorza one bid 05/28/2013 > stopped 10/07/13 no change  - 05/28/2013   Walked RA x one lap @ 185 stopped due to  Sob no tachypnea with sat 93%  - 08/12/2013  Walked RA x 3 laps @ 185 ft each stopped due to end of study sats 88% - 08/26/2013   Walked RA x one lap @ 185 stopped due to sob with sat 97%  - 10/18/2013 p extensive coaching HFA effectiveness =    75% from baseline of < 25% - spirometry 10/18/2013   0.99 (36%) ratio 40  - Alpha one genotype  03/29/2016  >   MM - 03/29/2016   trial of Bevespi 2bid x 5 weeks samples > DID NOT RETURN AS REC  - 12/30/2017  After extensive coaching inhaler device  effectiveness =    75% (short Ti)    Pt is Group B in terms of symptom/risk and laba/lama therefore appropriate rx at this point = bevespi  Discussed with pt: Formulary restrictions will be an ongoing challenge for the forseable future and I would be happy to pick an alternative if the pt will first  provide me a list of them but pt  will need to return here for training for any new device that is required eg dpi vs hfa vs respimat.    In meantime we can always provide samples so the patient never runs out of any needed respiratory medications.   saba use needs to be monitored as an "early indicator " of failure of maint rx and call if use goes up to reduce er trips/ admits  I had an extended discussion with the patient reviewing all relevant studies completed to date and  lasting 15 to 20 minutes of a 25 minute visit    Each maintenance medication was reviewed in detail including most importantly the difference between maintenance and prns and under what circumstances the prns are to be triggered using an action plan format that is not reflected in the computer generated alphabetically organized AVS.    Please see AVS for specific instructions unique to this visit that I personally wrote and verbalized to the  the pt in detail and then reviewed with pt  by my nurse highlighting any  changes in therapy recommended at today's visit to their plan of care.

## 2018-01-26 ENCOUNTER — Emergency Department (HOSPITAL_BASED_OUTPATIENT_CLINIC_OR_DEPARTMENT_OTHER)
Admit: 2018-01-26 | Discharge: 2018-01-26 | Disposition: A | Discharge status: Home / Self Care | Payer: Medicaid Other | Attending: Emergency Medicine | Admitting: Emergency Medicine

## 2018-01-26 ENCOUNTER — Emergency Department (HOSPITAL_COMMUNITY): Payer: Medicaid Other

## 2018-01-26 ENCOUNTER — Encounter (HOSPITAL_COMMUNITY): Payer: Self-pay | Admitting: Emergency Medicine

## 2018-01-26 ENCOUNTER — Inpatient Hospital Stay (HOSPITAL_COMMUNITY)
Admission: EM | Admit: 2018-01-26 | Discharge: 2018-02-01 | DRG: 189 | Disposition: A | Discharge status: Home Health | Payer: Medicaid Other | Attending: Family Medicine | Admitting: Family Medicine

## 2018-01-26 DIAGNOSIS — Z881 Allergy status to other antibiotic agents status: Secondary | ICD-10-CM

## 2018-01-26 DIAGNOSIS — R569 Unspecified convulsions: Secondary | ICD-10-CM

## 2018-01-26 DIAGNOSIS — K219 Gastro-esophageal reflux disease without esophagitis: Secondary | ICD-10-CM | POA: Diagnosis present

## 2018-01-26 DIAGNOSIS — Z9011 Acquired absence of right breast and nipple: Secondary | ICD-10-CM

## 2018-01-26 DIAGNOSIS — Z681 Body mass index (BMI) 19 or less, adult: Secondary | ICD-10-CM

## 2018-01-26 DIAGNOSIS — F209 Schizophrenia, unspecified: Secondary | ICD-10-CM | POA: Diagnosis present

## 2018-01-26 DIAGNOSIS — I252 Old myocardial infarction: Secondary | ICD-10-CM

## 2018-01-26 DIAGNOSIS — Z9981 Dependence on supplemental oxygen: Secondary | ICD-10-CM

## 2018-01-26 DIAGNOSIS — Z853 Personal history of malignant neoplasm of breast: Secondary | ICD-10-CM

## 2018-01-26 DIAGNOSIS — G40909 Epilepsy, unspecified, not intractable, without status epilepticus: Secondary | ICD-10-CM | POA: Diagnosis present

## 2018-01-26 DIAGNOSIS — I110 Hypertensive heart disease with heart failure: Secondary | ICD-10-CM | POA: Diagnosis present

## 2018-01-26 DIAGNOSIS — J9601 Acute respiratory failure with hypoxia: Secondary | ICD-10-CM | POA: Diagnosis present

## 2018-01-26 DIAGNOSIS — Z9882 Breast implant status: Secondary | ICD-10-CM

## 2018-01-26 DIAGNOSIS — I10 Essential (primary) hypertension: Secondary | ICD-10-CM | POA: Diagnosis present

## 2018-01-26 DIAGNOSIS — D751 Secondary polycythemia: Secondary | ICD-10-CM | POA: Diagnosis present

## 2018-01-26 DIAGNOSIS — M7989 Other specified soft tissue disorders: Secondary | ICD-10-CM

## 2018-01-26 DIAGNOSIS — Z79899 Other long term (current) drug therapy: Secondary | ICD-10-CM

## 2018-01-26 DIAGNOSIS — E039 Hypothyroidism, unspecified: Secondary | ICD-10-CM | POA: Diagnosis present

## 2018-01-26 DIAGNOSIS — F4310 Post-traumatic stress disorder, unspecified: Secondary | ICD-10-CM | POA: Diagnosis present

## 2018-01-26 DIAGNOSIS — I509 Heart failure, unspecified: Secondary | ICD-10-CM

## 2018-01-26 DIAGNOSIS — F329 Major depressive disorder, single episode, unspecified: Secondary | ICD-10-CM

## 2018-01-26 DIAGNOSIS — I5032 Chronic diastolic (congestive) heart failure: Secondary | ICD-10-CM | POA: Diagnosis present

## 2018-01-26 DIAGNOSIS — T501X6A Underdosing of loop [high-ceiling] diuretics, initial encounter: Secondary | ICD-10-CM | POA: Diagnosis present

## 2018-01-26 DIAGNOSIS — J439 Emphysema, unspecified: Secondary | ICD-10-CM | POA: Diagnosis present

## 2018-01-26 DIAGNOSIS — F32A Depression, unspecified: Secondary | ICD-10-CM

## 2018-01-26 DIAGNOSIS — E874 Mixed disorder of acid-base balance: Secondary | ICD-10-CM | POA: Diagnosis present

## 2018-01-26 DIAGNOSIS — R7989 Other specified abnormal findings of blood chemistry: Secondary | ICD-10-CM | POA: Diagnosis present

## 2018-01-26 DIAGNOSIS — F17210 Nicotine dependence, cigarettes, uncomplicated: Secondary | ICD-10-CM | POA: Diagnosis present

## 2018-01-26 DIAGNOSIS — M1990 Unspecified osteoarthritis, unspecified site: Secondary | ICD-10-CM | POA: Diagnosis present

## 2018-01-26 DIAGNOSIS — F319 Bipolar disorder, unspecified: Secondary | ICD-10-CM | POA: Diagnosis present

## 2018-01-26 DIAGNOSIS — Z8659 Personal history of other mental and behavioral disorders: Secondary | ICD-10-CM

## 2018-01-26 DIAGNOSIS — Z91410 Personal history of adult physical and sexual abuse: Secondary | ICD-10-CM

## 2018-01-26 DIAGNOSIS — J441 Chronic obstructive pulmonary disease with (acute) exacerbation: Secondary | ICD-10-CM | POA: Diagnosis present

## 2018-01-26 DIAGNOSIS — J9621 Acute and chronic respiratory failure with hypoxia: Principal | ICD-10-CM | POA: Diagnosis present

## 2018-01-26 DIAGNOSIS — Z9119 Patient's noncompliance with other medical treatment and regimen: Secondary | ICD-10-CM

## 2018-01-26 DIAGNOSIS — Z79811 Long term (current) use of aromatase inhibitors: Secondary | ICD-10-CM

## 2018-01-26 DIAGNOSIS — C50419 Malignant neoplasm of upper-outer quadrant of unspecified female breast: Secondary | ICD-10-CM | POA: Diagnosis present

## 2018-01-26 DIAGNOSIS — T380X5A Adverse effect of glucocorticoids and synthetic analogues, initial encounter: Secondary | ICD-10-CM | POA: Diagnosis present

## 2018-01-26 DIAGNOSIS — E43 Unspecified severe protein-calorie malnutrition: Secondary | ICD-10-CM | POA: Diagnosis present

## 2018-01-26 DIAGNOSIS — F1410 Cocaine abuse, uncomplicated: Secondary | ICD-10-CM | POA: Diagnosis present

## 2018-01-26 DIAGNOSIS — R5381 Other malaise: Secondary | ICD-10-CM | POA: Diagnosis present

## 2018-01-26 DIAGNOSIS — I824Z2 Acute embolism and thrombosis of unspecified deep veins of left distal lower extremity: Secondary | ICD-10-CM | POA: Diagnosis present

## 2018-01-26 HISTORY — DX: Cocaine abuse, uncomplicated: F14.10

## 2018-01-26 LAB — CBC WITH DIFFERENTIAL/PLATELET
BASOS PCT: 0 %
Basophils Absolute: 0 10*3/uL (ref 0.0–0.1)
EOS ABS: 0.1 10*3/uL (ref 0.0–0.7)
Eosinophils Relative: 1 %
HCT: 48.6 % — ABNORMAL HIGH (ref 36.0–46.0)
HEMOGLOBIN: 14.3 g/dL (ref 12.0–15.0)
LYMPHS ABS: 1.5 10*3/uL (ref 0.7–4.0)
Lymphocytes Relative: 16 %
MCH: 25.5 pg — AB (ref 26.0–34.0)
MCHC: 29.4 g/dL — AB (ref 30.0–36.0)
MCV: 86.8 fL (ref 78.0–100.0)
MONO ABS: 0.7 10*3/uL (ref 0.1–1.0)
MONOS PCT: 8 %
NEUTROS PCT: 75 %
Neutro Abs: 7.1 10*3/uL (ref 1.7–7.7)
Platelets: 230 10*3/uL (ref 150–400)
RBC: 5.6 MIL/uL — ABNORMAL HIGH (ref 3.87–5.11)
RDW: 19.4 % — AB (ref 11.5–15.5)
WBC: 9.4 10*3/uL (ref 4.0–10.5)

## 2018-01-26 LAB — URINALYSIS, ROUTINE W REFLEX MICROSCOPIC
BILIRUBIN URINE: NEGATIVE
Glucose, UA: NEGATIVE mg/dL
KETONES UR: NEGATIVE mg/dL
Nitrite: NEGATIVE
PH: 5 (ref 5.0–8.0)
Protein, ur: NEGATIVE mg/dL
Specific Gravity, Urine: 1.005 (ref 1.005–1.030)

## 2018-01-26 LAB — PROTIME-INR
INR: 1.06
Prothrombin Time: 13.8 seconds (ref 11.4–15.2)

## 2018-01-26 LAB — RAPID URINE DRUG SCREEN, HOSP PERFORMED
AMPHETAMINES: NOT DETECTED
BARBITURATES: NOT DETECTED
BENZODIAZEPINES: NOT DETECTED
Cocaine: POSITIVE — AB
Opiates: NOT DETECTED
TETRAHYDROCANNABINOL: NOT DETECTED

## 2018-01-26 LAB — I-STAT CHEM 8, ED
BUN: 23 mg/dL — AB (ref 6–20)
CALCIUM ION: 1.08 mmol/L — AB (ref 1.15–1.40)
CREATININE: 0.7 mg/dL (ref 0.44–1.00)
Chloride: 93 mmol/L — ABNORMAL LOW (ref 101–111)
Glucose, Bld: 68 mg/dL (ref 65–99)
HCT: 53 % — ABNORMAL HIGH (ref 36.0–46.0)
HEMOGLOBIN: 18 g/dL — AB (ref 12.0–15.0)
Potassium: 4.2 mmol/L (ref 3.5–5.1)
SODIUM: 138 mmol/L (ref 135–145)
TCO2: 36 mmol/L — ABNORMAL HIGH (ref 22–32)

## 2018-01-26 LAB — I-STAT VENOUS BLOOD GAS, ED
Acid-Base Excess: 11 mmol/L — ABNORMAL HIGH (ref 0.0–2.0)
BICARBONATE: 43.1 mmol/L — AB (ref 20.0–28.0)
O2 Saturation: 85 %
PCO2 VEN: 90.5 mmHg — AB (ref 44.0–60.0)
PO2 VEN: 61 mmHg — AB (ref 32.0–45.0)
TCO2: 46 mmol/L — AB (ref 22–32)
pH, Ven: 7.286 (ref 7.250–7.430)

## 2018-01-26 LAB — TROPONIN I: Troponin I: <0.03 ng/mL (ref <0.03)

## 2018-01-26 LAB — BRAIN NATRIURETIC PEPTIDE: B NATRIURETIC PEPTIDE 5: 958.4 pg/mL — AB (ref 0.0–100.0)

## 2018-01-26 LAB — TSH: TSH: 0.976 u[IU]/mL (ref 0.350–4.500)

## 2018-01-26 LAB — ETHANOL

## 2018-01-26 MED ORDER — IPRATROPIUM-ALBUTEROL 0.5-2.5 (3) MG/3ML IN SOLN
3.0000 mL | Freq: Four times a day (QID) | RESPIRATORY_TRACT | Status: DC
Start: 2018-01-26 — End: 2018-01-27
  Administered 2018-01-27 (×3): 3 mL via RESPIRATORY_TRACT
  Filled 2018-01-26 (×6): qty 3

## 2018-01-26 MED ORDER — PANTOPRAZOLE SODIUM 40 MG PO TBEC
40.0000 mg | DELAYED_RELEASE_TABLET | Freq: Every day | ORAL | Status: DC
  Administered 2018-01-27 – 2018-01-31 (×5): 40 mg via ORAL
  Filled 2018-01-26 (×6): qty 1

## 2018-01-26 MED ORDER — DIVALPROEX SODIUM 500 MG PO DR TAB
500.0000 mg | DELAYED_RELEASE_TABLET | Freq: Two times a day (BID) | ORAL | Status: DC
  Administered 2018-01-26 – 2018-02-01 (×5): 500 mg via ORAL
  Filled 2018-01-26 (×13): qty 1

## 2018-01-26 MED ORDER — IPRATROPIUM-ALBUTEROL 0.5-2.5 (3) MG/3ML IN SOLN
3.0000 mL | Freq: Once | RESPIRATORY_TRACT | Status: AC
  Administered 2018-01-26: 3 mL via RESPIRATORY_TRACT
  Filled 2018-01-26: qty 3

## 2018-01-26 MED ORDER — BOOST / RESOURCE BREEZE PO LIQD CUSTOM
1.0000 | ORAL | Status: DC
  Administered 2018-01-27: 1 via ORAL
  Filled 2018-01-26: qty 1

## 2018-01-26 MED ORDER — DEXTROSE 5 % IV SOLN
250.0000 mg | INTRAVENOUS | Status: DC
  Administered 2018-01-26: 250 mg via INTRAVENOUS
  Filled 2018-01-26 (×2): qty 250

## 2018-01-26 MED ORDER — ALBUTEROL SULFATE (2.5 MG/3ML) 0.083% IN NEBU: 2.5000 mg | INHALATION_SOLUTION | RESPIRATORY_TRACT | Status: DC | PRN

## 2018-01-26 MED ORDER — SENNOSIDES-DOCUSATE SODIUM 8.6-50 MG PO TABS: 1.0000 | ORAL_TABLET | Freq: Every evening | ORAL | Status: DC | PRN

## 2018-01-26 MED ORDER — FUROSEMIDE 10 MG/ML IJ SOLN
40.0000 mg | Freq: Once | INTRAMUSCULAR | Status: AC
  Administered 2018-01-26: 40 mg via INTRAVENOUS
  Filled 2018-01-26: qty 4

## 2018-01-26 MED ORDER — OLANZAPINE 10 MG PO TABS
20.0000 mg | ORAL_TABLET | Freq: Every day | ORAL | Status: DC
  Administered 2018-01-27 – 2018-01-31 (×5): 20 mg via ORAL
  Filled 2018-01-26 (×7): qty 2

## 2018-01-26 MED ORDER — HYDROCODONE-ACETAMINOPHEN 5-325 MG PO TABS
1.0000 | ORAL_TABLET | ORAL | Status: DC | PRN
Start: 2018-01-26 — End: 2018-02-01

## 2018-01-26 MED ORDER — METHYLPREDNISOLONE SODIUM SUCC 125 MG IJ SOLR: 60.0000 mg | Freq: Two times a day (BID) | INTRAMUSCULAR | Status: DC

## 2018-01-26 MED ORDER — ALBUTEROL (5 MG/ML) CONTINUOUS INHALATION SOLN
10.0000 mg/h | INHALATION_SOLUTION | Freq: Once | RESPIRATORY_TRACT | Status: AC
  Administered 2018-01-26: 10 mg/h via RESPIRATORY_TRACT
  Filled 2018-01-26: qty 20

## 2018-01-26 MED ORDER — METHYLPREDNISOLONE SODIUM SUCC 125 MG IJ SOLR
80.0000 mg | Freq: Two times a day (BID) | INTRAMUSCULAR | Status: DC
  Administered 2018-01-26 – 2018-01-28 (×4): 80 mg via INTRAVENOUS
  Filled 2018-01-26 (×4): qty 2

## 2018-01-26 MED ORDER — ACETAMINOPHEN 325 MG PO TABS: 650.0000 mg | ORAL_TABLET | Freq: Four times a day (QID) | ORAL | Status: DC | PRN

## 2018-01-26 MED ORDER — IPRATROPIUM-ALBUTEROL 0.5-2.5 (3) MG/3ML IN SOLN: 3.0000 mL | RESPIRATORY_TRACT | Status: DC | PRN

## 2018-01-26 MED ORDER — ENOXAPARIN SODIUM 40 MG/0.4ML ~~LOC~~ SOLN
40.0000 mg | SUBCUTANEOUS | Status: DC
  Administered 2018-01-27 – 2018-01-31 (×5): 40 mg via SUBCUTANEOUS
  Filled 2018-01-26 (×7): qty 0.4

## 2018-01-26 MED ORDER — FUROSEMIDE 10 MG/ML IJ SOLN: 20.0000 mg | Freq: Two times a day (BID) | INTRAMUSCULAR | Status: DC

## 2018-01-26 MED ORDER — METHYLPREDNISOLONE SODIUM SUCC 125 MG IJ SOLR
125.0000 mg | Freq: Once | INTRAMUSCULAR | Status: AC
  Administered 2018-01-26: 125 mg via INTRAVENOUS
  Filled 2018-01-26: qty 2

## 2018-01-26 MED ORDER — BISACODYL 10 MG RE SUPP
10.0000 mg | Freq: Every day | RECTAL | Status: DC | PRN
  Filled 2018-01-26: qty 1

## 2018-01-26 MED ORDER — IPRATROPIUM BROMIDE 0.02 % IN SOLN
0.5000 mg | Freq: Once | RESPIRATORY_TRACT | Status: AC
  Administered 2018-01-26: 0.5 mg via RESPIRATORY_TRACT
  Filled 2018-01-26: qty 2.5

## 2018-01-26 MED ORDER — FLUOXETINE HCL 20 MG PO CAPS
20.0000 mg | ORAL_CAPSULE | Freq: Every day | ORAL | Status: DC
  Administered 2018-01-27 – 2018-01-29 (×3): 20 mg via ORAL
  Filled 2018-01-26 (×6): qty 1

## 2018-01-26 MED ORDER — FAMOTIDINE 20 MG PO TABS
20.0000 mg | ORAL_TABLET | Freq: Two times a day (BID) | ORAL | Status: DC
  Administered 2018-01-26 – 2018-01-31 (×11): 20 mg via ORAL
  Filled 2018-01-26 (×12): qty 1

## 2018-01-26 MED ORDER — AMLODIPINE BESYLATE 5 MG PO TABS
5.0000 mg | ORAL_TABLET | Freq: Every day | ORAL | Status: DC
  Administered 2018-01-27: 5 mg via ORAL
  Filled 2018-01-26: qty 1

## 2018-01-26 MED ORDER — ONDANSETRON HCL 4 MG/2ML IJ SOLN: 4.0000 mg | Freq: Once | INTRAMUSCULAR | Status: DC | PRN

## 2018-01-26 MED ORDER — LEVOTHYROXINE SODIUM 75 MCG PO TABS
75.0000 ug | ORAL_TABLET | Freq: Every day | ORAL | Status: DC
  Administered 2018-01-27 – 2018-02-01 (×6): 75 ug via ORAL
  Filled 2018-01-26 (×8): qty 1

## 2018-01-26 MED ORDER — ONDANSETRON HCL 4 MG PO TABS: 4.0000 mg | ORAL_TABLET | Freq: Four times a day (QID) | ORAL | Status: DC | PRN

## 2018-01-26 MED ORDER — METHYLPREDNISOLONE SODIUM SUCC 125 MG IJ SOLR: 40.0000 mg | Freq: Two times a day (BID) | INTRAMUSCULAR | Status: DC

## 2018-01-26 MED ORDER — ANASTROZOLE 1 MG PO TABS
1.0000 mg | ORAL_TABLET | Freq: Every day | ORAL | Status: DC
  Administered 2018-01-27 – 2018-02-01 (×6): 1 mg via ORAL
  Filled 2018-01-26 (×6): qty 1

## 2018-01-26 MED ORDER — FUROSEMIDE 10 MG/ML IJ SOLN: 40.0000 mg | Freq: Once | INTRAMUSCULAR | Status: DC

## 2018-01-26 MED ORDER — ACETAMINOPHEN 650 MG RE SUPP: 650.0000 mg | Freq: Four times a day (QID) | RECTAL | Status: DC | PRN

## 2018-01-26 MED ORDER — ONDANSETRON HCL 4 MG/2ML IJ SOLN: 4.0000 mg | Freq: Four times a day (QID) | INTRAMUSCULAR | Status: DC | PRN

## 2018-01-26 MED ORDER — NICOTINE 7 MG/24HR TD PT24
7.0000 mg | MEDICATED_PATCH | Freq: Every day | TRANSDERMAL | Status: DC
  Administered 2018-01-26 – 2018-02-01 (×7): 7 mg via TRANSDERMAL
  Filled 2018-01-26 (×7): qty 1
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
## 2018-01-26 NOTE — ED Notes (Signed)
Patient transported to X-ray 

## 2018-01-26 NOTE — ED Notes (Signed)
Patient transported to Ultrasound 

## 2018-01-26 NOTE — ED Notes (Signed)
Attempted report 

## 2018-01-26 NOTE — ED Notes (Signed)
Patient appears malnourished; alert and oriented x4.

## 2018-01-26 NOTE — Progress Notes (Signed)
Patient has refused to be placed onto BIPAP on arrival to room 2W02. Will attempt to transition patient to nasal cannula as she arrived on NRB. O2 sats on NRB are 100%, RR 15, crackles heard in all fields. Patient educated on reason and need for BIPAP but still refuses unless she is allowed to eat. Patient counseled regarding risk of aspiration, and agrees that she must stay off BIPAP for at least 46minutes after eating. MD made aware via text page.
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
## 2018-01-26 NOTE — Progress Notes (Signed)
Left lower extremity venous duplex completed. Preliminary results. There is no evidence of a DVT,superficial thrombosis, or Baker's cyst. Toma Copier, RVS 01/26/2018 10:05 AM

## 2018-01-26 NOTE — ED Notes (Signed)
ED Provider at bedside. 

## 2018-01-26 NOTE — ED Triage Notes (Signed)
Per EMS, pt from home. Pt c/o sob progressively worse throughout the day and leg swelling for the last couple of days. Pt prescribed Lasix but unaware that it was for swelling so pt has not taken it recently. Pt reports still urinating frequently. Per ems, lung sounds diminished, wheezing and retracting respirations. Duoneb given by ems. Pt reports relief after duoneb.   EMS VS - 87% on 2L upon ems arrival to 98% on duoneb. BP 104/60, HR 90.

## 2018-01-27 ENCOUNTER — Other Ambulatory Visit: Payer: Self-pay

## 2018-01-27 ENCOUNTER — Ambulatory Visit: Payer: Medicaid Other | Admitting: Internal Medicine

## 2018-01-27 DIAGNOSIS — C50419 Malignant neoplasm of upper-outer quadrant of unspecified female breast: Secondary | ICD-10-CM | POA: Diagnosis present

## 2018-01-27 DIAGNOSIS — F209 Schizophrenia, unspecified: Secondary | ICD-10-CM | POA: Diagnosis present

## 2018-01-27 DIAGNOSIS — E874 Mixed disorder of acid-base balance: Secondary | ICD-10-CM | POA: Diagnosis present

## 2018-01-27 DIAGNOSIS — M1990 Unspecified osteoarthritis, unspecified site: Secondary | ICD-10-CM | POA: Diagnosis present

## 2018-01-27 DIAGNOSIS — J441 Chronic obstructive pulmonary disease with (acute) exacerbation: Secondary | ICD-10-CM | POA: Diagnosis not present

## 2018-01-27 DIAGNOSIS — D751 Secondary polycythemia: Secondary | ICD-10-CM | POA: Diagnosis present

## 2018-01-27 DIAGNOSIS — E039 Hypothyroidism, unspecified: Secondary | ICD-10-CM | POA: Diagnosis present

## 2018-01-27 DIAGNOSIS — Z681 Body mass index (BMI) 19 or less, adult: Secondary | ICD-10-CM | POA: Diagnosis not present

## 2018-01-27 DIAGNOSIS — Z9981 Dependence on supplemental oxygen: Secondary | ICD-10-CM | POA: Diagnosis not present

## 2018-01-27 DIAGNOSIS — K219 Gastro-esophageal reflux disease without esophagitis: Secondary | ICD-10-CM | POA: Diagnosis present

## 2018-01-27 DIAGNOSIS — R5381 Other malaise: Secondary | ICD-10-CM | POA: Diagnosis present

## 2018-01-27 DIAGNOSIS — T501X6A Underdosing of loop [high-ceiling] diuretics, initial encounter: Secondary | ICD-10-CM | POA: Diagnosis present

## 2018-01-27 DIAGNOSIS — G40909 Epilepsy, unspecified, not intractable, without status epilepticus: Secondary | ICD-10-CM | POA: Diagnosis present

## 2018-01-27 DIAGNOSIS — I824Z2 Acute embolism and thrombosis of unspecified deep veins of left distal lower extremity: Secondary | ICD-10-CM | POA: Diagnosis present

## 2018-01-27 DIAGNOSIS — F17210 Nicotine dependence, cigarettes, uncomplicated: Secondary | ICD-10-CM | POA: Diagnosis present

## 2018-01-27 DIAGNOSIS — T380X5A Adverse effect of glucocorticoids and synthetic analogues, initial encounter: Secondary | ICD-10-CM | POA: Diagnosis present

## 2018-01-27 DIAGNOSIS — I5032 Chronic diastolic (congestive) heart failure: Secondary | ICD-10-CM | POA: Diagnosis present

## 2018-01-27 DIAGNOSIS — R7989 Other specified abnormal findings of blood chemistry: Secondary | ICD-10-CM | POA: Diagnosis present

## 2018-01-27 DIAGNOSIS — F1410 Cocaine abuse, uncomplicated: Secondary | ICD-10-CM | POA: Diagnosis present

## 2018-01-27 DIAGNOSIS — E43 Unspecified severe protein-calorie malnutrition: Secondary | ICD-10-CM | POA: Diagnosis present

## 2018-01-27 DIAGNOSIS — I110 Hypertensive heart disease with heart failure: Secondary | ICD-10-CM | POA: Diagnosis present

## 2018-01-27 DIAGNOSIS — J439 Emphysema, unspecified: Secondary | ICD-10-CM | POA: Diagnosis present

## 2018-01-27 DIAGNOSIS — F319 Bipolar disorder, unspecified: Secondary | ICD-10-CM | POA: Diagnosis present

## 2018-01-27 DIAGNOSIS — F4310 Post-traumatic stress disorder, unspecified: Secondary | ICD-10-CM | POA: Diagnosis present

## 2018-01-27 DIAGNOSIS — J9601 Acute respiratory failure with hypoxia: Secondary | ICD-10-CM | POA: Diagnosis not present

## 2018-01-27 DIAGNOSIS — J9621 Acute and chronic respiratory failure with hypoxia: Secondary | ICD-10-CM | POA: Diagnosis present

## 2018-01-27 LAB — CBC
HEMATOCRIT: 50 % — AB (ref 36.0–46.0)
Hemoglobin: 14.1 g/dL (ref 12.0–15.0)
MCH: 25 pg — AB (ref 26.0–34.0)
MCHC: 28.2 g/dL — ABNORMAL LOW (ref 30.0–36.0)
MCV: 88.7 fL (ref 78.0–100.0)
Platelets: 254 10*3/uL (ref 150–400)
RBC: 5.64 MIL/uL — AB (ref 3.87–5.11)
RDW: 19 % — ABNORMAL HIGH (ref 11.5–15.5)
WBC: 6 10*3/uL (ref 4.0–10.5)

## 2018-01-27 LAB — BASIC METABOLIC PANEL
Anion gap: 8 (ref 5–15)
BUN: 18 mg/dL (ref 6–20)
CO2: 37 mmol/L — AB (ref 22–32)
Calcium: 8.6 mg/dL — ABNORMAL LOW (ref 8.9–10.3)
Chloride: 94 mmol/L — ABNORMAL LOW (ref 101–111)
Creatinine, Ser: 0.58 mg/dL (ref 0.44–1.00)
GFR calc non Af Amer: >60 mL/min (ref >60)
Glucose, Bld: 139 mg/dL — ABNORMAL HIGH (ref 65–99)
POTASSIUM: 5 mmol/L (ref 3.5–5.1)
SODIUM: 139 mmol/L (ref 135–145)

## 2018-01-27 LAB — MRSA PCR SCREENING: MRSA by PCR: NEGATIVE

## 2018-01-27 MED ORDER — ENSURE ENLIVE PO LIQD
237.0000 mL | Freq: Two times a day (BID) | ORAL | Status: DC
  Administered 2018-01-28 – 2018-02-01 (×9): 237 mL via ORAL

## 2018-01-27 MED ORDER — DIPHENHYDRAMINE HCL 25 MG PO CAPS
50.0000 mg | ORAL_CAPSULE | Freq: Every day | ORAL | Status: DC
  Administered 2018-01-27 – 2018-01-31 (×5): 50 mg via ORAL
  Filled 2018-01-27 (×5): qty 2

## 2018-01-27 MED ORDER — IPRATROPIUM-ALBUTEROL 0.5-2.5 (3) MG/3ML IN SOLN: 3.0000 mL | Freq: Three times a day (TID) | RESPIRATORY_TRACT | Status: DC

## 2018-01-27 MED ORDER — ADULT MULTIVITAMIN W/MINERALS CH
1.0000 | ORAL_TABLET | Freq: Every day | ORAL | Status: DC
Start: 2018-01-27 — End: 2018-02-01
  Administered 2018-01-28 – 2018-01-31 (×4): 1 via ORAL
  Filled 2018-01-27 (×5): qty 1

## 2018-01-27 MED ORDER — FUROSEMIDE 40 MG PO TABS
40.0000 mg | ORAL_TABLET | Freq: Every day | ORAL | Status: DC
  Administered 2018-01-28 – 2018-01-30 (×3): 40 mg via ORAL
  Filled 2018-01-27 (×3): qty 1

## 2018-01-27 MED ORDER — AZITHROMYCIN 250 MG PO TABS
500.0000 mg | ORAL_TABLET | Freq: Every day | ORAL | Status: AC
  Administered 2018-01-27 – 2018-01-30 (×4): 500 mg via ORAL
  Filled 2018-01-27 (×4): qty 2

## 2018-01-27 MED ORDER — ALBUTEROL SULFATE (2.5 MG/3ML) 0.083% IN NEBU: 2.5000 mg | INHALATION_SOLUTION | RESPIRATORY_TRACT | Status: DC | PRN

## 2018-01-27 MED ORDER — FUROSEMIDE 40 MG PO TABS: 40.0000 mg | ORAL_TABLET | Freq: Every day | ORAL | Status: DC

## 2018-01-27 NOTE — Progress Notes (Signed)
[facility] TEAM 1 - Stepdown/ICU TEAM  Courtney Terry  OEV:[id number] DOB: 1961/05/25 DOA: 01/26/2018 PCP: [name], MD    Brief Narrative:  57 y.o. female with a hx of seizures; schizophrenia; hypothyroidism; HTN; COPD on 2L home O2; recurrent breast cancer; and cocaine abuse who presented with acute respiratory failure.    Significant Events: 4/22 admit  4/22 venous duplex - no DVT or SVT  Subjective: Pt is sleeping soundly at the time of my exam.  She does not wake up when I examine her.  She is in no acute resp distress, nor is there evidence of uncontrolled pain.    Assessment & Plan:  Acute on chronic hypoxic respiratory failure - acute COPD exacerbation Rapidly improving - cont usual tx for COPD exac  Chronic diastolic heart failure - medication non-compliance TTE Feb 2019 noted EF 65-70% w/ grade 1 DD - baseline weight difficult to judge but appears to vary from 51-58kg - diurese - follow Is/Os and daily weights - not grossly overloaded on exam  Filed Weights   01/26/18 2025 01/27/18 0500  Weight: 58.9 kg (129 lb 13.6 oz) 58.9 kg (129 lb 13.6 oz)    Hypertension BP well controlled at this time   Hypothyroidism Continue home Synthroid  Substance abuse UDS positive for Cocaine   History of breast cancer s/p mastectomy - currently on Arimidex  Schizophrenia, Depression, Bipolar disorder Cont usual outpt meds   Seizures Continue home meds   DVT prophylaxis: lovenox  Code Status: FULL CODE Family Communication: no family present at time of exam  Disposition Plan: tele bed   Consultants:  none  Antimicrobials:  Azithromycin 4/22 >  Objective: Blood pressure (!) 98/56, pulse 93, temperature 98.5 F (36.9 C), temperature source Oral, resp. rate 14, height 5\' 10"  (1.778 m), weight 58.9 kg (129 lb 13.6 oz), SpO2 (P) 91 %.  Intake/Output Summary (Last 24 hours) at 01/27/2018 1638 Last data filed at 01/27/2018 0400 Gross per 24 hour  Intake 480 ml   Output 100 ml  Net 380 ml   Filed Weights   01/26/18 2025 01/27/18 0500  Weight: 58.9 kg (129 lb 13.6 oz) 58.9 kg (129 lb 13.6 oz)    Examination: General: No acute respiratory distress Lungs: Clear to auscultation bilaterally without wheezes or crackles Cardiovascular: Regular rate and rhythm without murmur  Abdomen: Nontender, nondistended, soft, bowel sounds positive Extremities: trace B LE edema   CBC: Recent Labs  Lab 01/26/18 0517 01/26/18 0523 01/27/18 0218  WBC 9.4  --  6.0  NEUTROABS 7.1  --   --   HGB 14.3 18.0* 14.1  HCT 48.6* 53.0* 50.0*  MCV 86.8  --  88.7  PLT 230  --  829   Basic Metabolic Panel: Recent Labs  Lab 01/26/18 0523 01/27/18 0218  NA 138 139  K 4.2 5.0  CL 93* 94*  CO2  --  37*  GLUCOSE 68 139*  BUN 23* 18  CREATININE 0.70 0.58  CALCIUM  --  8.6*   GFR: Estimated Creatinine Clearance: 73.9 mL/min (by C-G formula based on SCr of 0.58 mg/dL).  Liver Function Tests: No results for input(s): AST, ALT, ALKPHOS, BILITOT, PROT, ALBUMIN in the last 168 hours. No results for input(s): LIPASE, AMYLASE in the last 168 hours. No results for input(s): AMMONIA in the last 168 hours.  Coagulation Profile: Recent Labs  Lab 01/26/18 0517  INR 1.06    Cardiac Enzymes: Recent Labs  Lab 01/26/18 0517  TROPONINI <0.03  HbA1C: Hgb A1c MFr Bld  Date/Time Value Ref Range Status  02/26/2016 04:15 PM 6.2 (H) 4.8 - 5.6 % Final    Comment:    (NOTE)         Pre-diabetes: 5.7 - 6.4         Diabetes: >6.4         Glycemic control for adults with diabetes: <7.0      Recent Results (from the past 240 hour(s))  MRSA PCR Screening     Status: None   Collection Time: 01/27/18  6:21 AM  Result Value Ref Range Status   MRSA by PCR NEGATIVE NEGATIVE Final    Comment:        The GeneXpert MRSA Assay (FDA approved for NASAL specimens only), is one component of a comprehensive MRSA colonization surveillance program. It is not intended to  diagnose MRSA infection nor to guide or monitor treatment for MRSA infections. Performed at [facility], [place] [address] [place], [place] [address]      Scheduled Meds: . amLODipine  5 mg Oral Daily  . anastrozole  1 mg Oral Daily  . azithromycin  500 mg Oral Daily  . divalproex  500 mg Oral BID  . enoxaparin (LOVENOX) injection  40 mg Subcutaneous Q24H  . famotidine  20 mg Oral BID  . feeding supplement  1 Container Oral Q24H  . FLUoxetine  20 mg Oral Daily  . ipratropium-albuterol  3 mL Nebulization Q6H  . levothyroxine  75 mcg Oral QAC breakfast  . methylPREDNISolone sodium succinate  80 mg Intravenous Q12H  . nicotine  7 mg Transdermal Daily  . OLANZapine  20 mg Oral QHS  . pantoprazole  40 mg Oral Daily     LOS: 0 days   Cherene Altes, MD [facility] Office  [phone] Pager - Text Page per Amion as per below:  On-Call/Text Page:      [email or web address]      password [email or web address]  If 7PM-7AM, please contact night-coverage [email or web address] Password [facility] 01/27/2018, 4:38 PM

## 2018-01-27 NOTE — Progress Notes (Signed)
Pt changed over to tele box from stepdown status.  Pt remains sleepy but arousable.  Very somnambulate.

## 2018-01-27 NOTE — Progress Notes (Signed)
PT Cancellation Note  Patient Details Name: Courtney Terry MRN: [id number] DOB: [birth year]   Cancelled Treatment:    Reason Eval/Treat Not Completed: Other (comment).  Pt is declining therapy right now given that she just had OT.  Will try later as time and pt allow.   Ramond Dial 01/27/2018, 10:08 AM   Mee Hives, PT MS Acute Rehab Dept. Number: [place] and [place]
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
## 2018-01-28 LAB — BASIC METABOLIC PANEL
Anion gap: 7 (ref 5–15)
BUN: 18 mg/dL (ref 6–20)
CO2: 36 mmol/L — AB (ref 22–32)
Calcium: 8.3 mg/dL — ABNORMAL LOW (ref 8.9–10.3)
Chloride: 97 mmol/L — ABNORMAL LOW (ref 101–111)
Creatinine, Ser: 0.5 mg/dL (ref 0.44–1.00)
GFR calc Af Amer: >60 mL/min (ref >60)
GLUCOSE: 119 mg/dL — AB (ref 65–99)
POTASSIUM: 5.3 mmol/L — AB (ref 3.5–5.1)
Sodium: 140 mmol/L (ref 135–145)

## 2018-01-28 LAB — CBC
HEMATOCRIT: 45.8 % (ref 36.0–46.0)
HEMOGLOBIN: 13 g/dL (ref 12.0–15.0)
MCH: 25 pg — AB (ref 26.0–34.0)
MCHC: 28.4 g/dL — AB (ref 30.0–36.0)
MCV: 88.1 fL (ref 78.0–100.0)
Platelets: 221 10*3/uL (ref 150–400)
RBC: 5.2 MIL/uL — ABNORMAL HIGH (ref 3.87–5.11)
RDW: 18.8 % — AB (ref 11.5–15.5)
WBC: 6.2 10*3/uL (ref 4.0–10.5)

## 2018-01-28 MED ORDER — IPRATROPIUM-ALBUTEROL 0.5-2.5 (3) MG/3ML IN SOLN
3.0000 mL | RESPIRATORY_TRACT | Status: DC
  Administered 2018-01-28 (×3): 3 mL via RESPIRATORY_TRACT
  Filled 2018-01-28 (×4): qty 3

## 2018-01-28 MED ORDER — IPRATROPIUM-ALBUTEROL 0.5-2.5 (3) MG/3ML IN SOLN
3.0000 mL | Freq: Four times a day (QID) | RESPIRATORY_TRACT | Status: DC
  Administered 2018-01-29: 3 mL via RESPIRATORY_TRACT
  Filled 2018-01-28: qty 3

## 2018-01-28 MED ORDER — BUDESONIDE 0.5 MG/2ML IN SUSP
0.5000 mg | Freq: Two times a day (BID) | RESPIRATORY_TRACT | Status: DC
  Administered 2018-01-28 – 2018-02-01 (×8): 0.5 mg via RESPIRATORY_TRACT
  Filled 2018-01-28 (×9): qty 2

## 2018-01-28 MED ORDER — METHYLPREDNISOLONE SODIUM SUCC 125 MG IJ SOLR
40.0000 mg | Freq: Two times a day (BID) | INTRAMUSCULAR | Status: DC
  Administered 2018-01-28 – 2018-01-30 (×4): 40 mg via INTRAVENOUS
  Filled 2018-01-28 (×4): qty 2
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
## 2018-01-28 NOTE — Progress Notes (Signed)
CSW met with pt and pt caregiver at bedside to discuss PT recommendation for SNF.  Pt lives with roommate who is there daily and who pt reports can assist if needed- also has caregiver who comes in daily and reports that he stays as long as he needs to.  Not interested in SNF at this time and feels safe going home- [facility] informed of pt preference for return home.  Pt reports already having home oxygen and equipment at home and doesn't think she needs anything additional.  CSW signing off  Jorge Ny, [place] Social Worker [phone]

## 2018-01-28 NOTE — Progress Notes (Signed)
PROGRESS NOTE    Courtney Terry  VOZ:[id number] DOB: Mar 16, 1961 DOA: 01/26/2018 PCP: [name], MD   Brief Narrative:   57 year old with a history of COPD on 2 L nasal cannula at home, hypothyroidism, schizophrenia, essential hypertension, cocaine abuse, moderate to severe protein calorie malnutrition, recurrent breast cancer was admitted to the hospital for shortness of breath secondary to COPD exacerbation.  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)   COPD exacerbation (HCC)   Protein-calorie malnutrition, severe   Schizophrenia (HCC)   Hypothyroidism   Bipolar I disorder ([place])   Seizures (HCC)   Hypertension   CHF (congestive heart failure) ([place])   H/O right mastectomy   Depression  Acute respiratory distress, acute on chronic hypoxia Acute moderate exacerbation of mild persistent COPD Medication noncompliance -We will change Solu-Medrol to 40 mg every 12 hours, nebulizer treatments scheduled and as necessary - Pulmicort added -Supplemental oxygen as needed, wean down as possible - Continue oral azithromycin for total of 5 days - Incentive spirometry ordered  Bilateral lower extremity swelling -Although the patient reports this I do not see any physical evidence of this -Lower extremity duplex was negative for any blood clots  Chronic diastolic congestive heart failure - Echocardiogram from February 2019 shows ejection fraction 70% with grade 1 diastolic dysfunction -Does not really take any medications at home  History of seizure disorder -Continue Depakote  Hypothyroidism -Continue Synthroid 75 mcg daily  History of depression -Continue Zyprexa  GERD -Continue Protonix  DVT prophylaxis: Lovenox Code Status: Full code Family Communication: Significant other at bedside Disposition Plan: Maintain inpatient stay for  more IV steroids  Consultants:   None  Procedures:   None  Antimicrobials:   Azithromycin 4/22>   Subjective: Patient reports of bilateral lower extremity swelling and exertional shortness of breath but otherwise no other new complaints.  Feels somewhat similar to yesterday.  Review of Systems Otherwise negative except as per HPI, including: General: Denies fever, chills, night sweats or unintended weight loss. Resp: Denies cough Cardiac: Denies chest pain, palpitations, orthopnea, paroxysmal nocturnal dyspnea. GI: Denies abdominal pain, nausea, vomiting, diarrhea or constipation GU: Denies dysuria, frequency, hesitancy or incontinence MS: Denies muscle aches, joint pain or swelling Neuro: Denies headache, neurologic deficits (focal weakness, numbness, tingling), abnormal gait Psych: Denies anxiety, depression, SI/HI/AVH Skin: Denies new rashes or lesions ID: Denies sick contacts, exotic exposures, travel  Objective: Vitals:   01/28/18 0812 01/28/18 0829 01/28/18 1200 01/28/18 1231  BP: 97/73   109/66  Pulse: 64 83  91  Resp: 14 16  14   Temp: 98 F (36.7 C)   97.8 F (36.6 C)  TempSrc: Axillary   Oral  SpO2: 96% 95% 94% (!) 58%  Weight:      Height:        Intake/Output Summary (Last 24 hours) at 01/28/2018 1314 Last data filed at 01/28/2018 0400 Gross per 24 hour  Intake 240 ml  Output 0 ml  Net 240 ml   Filed Weights   01/26/18 2025 01/27/18 0500 01/28/18 0500  Weight: 58.9 kg (129 lb 13.6 oz) 58.9 kg (129 lb 13.6 oz) 58.3 kg (128 lb 8.5 oz)    Examination:  General exam: Appears calm and comfortable  Respiratory system: C diffuse diminished breath sounds Cardiovascular system: S1 & S2 heard, RRR. No JVD, murmurs, rubs, gallops or clicks. Trace b/l LE pedal edema.  Gastrointestinal system: Abdomen is nondistended, soft and nontender. No organomegaly or masses felt. Normal bowel sounds heard. Central nervous system: Alert and oriented. No focal  neurological deficits. Extremities: Symmetric 5 x 5 power. Skin: No rashes, lesions or ulcers Psychiatry: Judgement and insight appear normal. Mood & affect appropriate.     Data Reviewed:   CBC: Recent Labs  Lab 01/26/18 0517 01/26/18 0523 01/27/18 0218 01/28/18 0200  WBC 9.4  --  6.0 6.2  NEUTROABS 7.1  --   --   --   HGB 14.3 18.0* 14.1 13.0  HCT 48.6* 53.0* 50.0* 45.8  MCV 86.8  --  88.7 88.1  PLT 230  --  254 147   Basic Metabolic Panel: Recent Labs  Lab 01/26/18 0523 01/27/18 0218 01/28/18 0200  NA 138 139 140  K 4.2 5.0 5.3*  CL 93* 94* 97*  CO2  --  37* 36*  GLUCOSE 68 139* 119*  BUN 23* 18 18  CREATININE 0.70 0.58 0.50  CALCIUM  --  8.6* 8.3*   GFR: Estimated Creatinine Clearance: 73.1 mL/min (by C-G formula based on SCr of 0.5 mg/dL). Liver Function Tests: No results for input(s): AST, ALT, ALKPHOS, BILITOT, PROT, ALBUMIN in the last 168 hours. No results for input(s): LIPASE, AMYLASE in the last 168 hours. No results for input(s): AMMONIA in the last 168 hours. Coagulation Profile: Recent Labs  Lab 01/26/18 0517  INR 1.06   Cardiac Enzymes: Recent Labs  Lab 01/26/18 0517  TROPONINI <0.03   BNP (last 3 results) No results for input(s): PROBNP in the last 8760 hours. HbA1C: No results for input(s): HGBA1C in the last 72 hours. CBG: No results for input(s): GLUCAP in the last 168 hours. Lipid Profile: No results for input(s): CHOL, HDL, LDLCALC, TRIG, CHOLHDL, LDLDIRECT in the last 72 hours. Thyroid Function Tests: Recent Labs    01/26/18 1200  TSH 0.976   Anemia Panel: No results for input(s): VITAMINB12, FOLATE, FERRITIN, TIBC, IRON, RETICCTPCT in the last 72 hours. Sepsis Labs: No results for input(s): PROCALCITON, LATICACIDVEN in the last 168 hours.  Recent Results (from the past 240 hour(s))  MRSA PCR Screening     Status: None   Collection Time: 01/27/18  6:21 AM  Result Value Ref Range Status   MRSA by PCR NEGATIVE NEGATIVE  Final    Comment:        The GeneXpert MRSA Assay (FDA approved for NASAL specimens only), is one component of a comprehensive MRSA colonization surveillance program. It is not intended to diagnose MRSA infection nor to guide or monitor treatment for MRSA infections. Performed at [facility], [place] [address], [place], [place] [address]          Radiology Studies: No results found.      Scheduled Meds: . anastrozole  1 mg Oral Daily  . azithromycin  500 mg Oral Daily  . budesonide (PULMICORT) nebulizer solution  0.5 mg Nebulization BID  . diphenhydrAMINE  50 mg Oral QHS  . divalproex  500 mg Oral BID  . enoxaparin (LOVENOX) injection  40 mg Subcutaneous Q24H  . famotidine  20 mg Oral BID  . feeding supplement (ENSURE ENLIVE)  237 mL Oral BID BM  . FLUoxetine  20 mg Oral Daily  . furosemide  40 mg Oral Daily  . ipratropium-albuterol  3 mL Nebulization Q4H  . levothyroxine  75 mcg Oral QAC breakfast  . methylPREDNISolone sodium succinate  80 mg Intravenous Q12H  . multivitamin with  minerals  1 tablet Oral Daily  . nicotine  7 mg Transdermal Daily  . OLANZapine  20 mg Oral QHS  . pantoprazole  40 mg Oral Daily   Continuous Infusions:   LOS: 1 day    Time spent: 25 mins    Deldrick Linch Arsenio Loader, MD [facility] Pager [phone]   If 7PM-7AM, please contact night-coverage [email or web address] Password [email or web address] 01/28/2018, 1:14 PM

## 2018-01-29 LAB — BASIC METABOLIC PANEL
ANION GAP: 8 (ref 5–15)
BUN: 17 mg/dL (ref 6–20)
CALCIUM: 8.5 mg/dL — AB (ref 8.9–10.3)
CO2: 39 mmol/L — ABNORMAL HIGH (ref 22–32)
Chloride: 93 mmol/L — ABNORMAL LOW (ref 101–111)
Creatinine, Ser: 0.62 mg/dL (ref 0.44–1.00)
Glucose, Bld: 190 mg/dL — ABNORMAL HIGH (ref 65–99)
Potassium: 4.2 mmol/L (ref 3.5–5.1)
Sodium: 140 mmol/L (ref 135–145)

## 2018-01-29 LAB — MAGNESIUM: MAGNESIUM: 1.8 mg/dL (ref 1.7–2.4)

## 2018-01-29 MED ORDER — IPRATROPIUM-ALBUTEROL 0.5-2.5 (3) MG/3ML IN SOLN
3.0000 mL | Freq: Two times a day (BID) | RESPIRATORY_TRACT | Status: DC
  Administered 2018-01-30 – 2018-02-01 (×5): 3 mL via RESPIRATORY_TRACT
  Filled 2018-01-29 (×6): qty 3

## 2018-01-29 NOTE — Progress Notes (Signed)
PROGRESS NOTE    Courtney Terry  TFT:[id number] DOB: [birth year] DOA: 01/26/2018 PCP: [name], MD   Brief Narrative:   57 year old with a history of COPD on 2 L nasal cannula at home, hypothyroidism, schizophrenia, essential hypertension, cocaine abuse, moderate to severe protein calorie malnutrition, recurrent breast cancer was admitted to the hospital for shortness of breath secondary to COPD exacerbation. Slow to improve.  Currently she is on routine treatment for COPD along with oral azithromycin.  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  Acute moderate exacerbation of mild persistent COPD; slowly improving.  Medication noncompliance -Continue Solu-Medrol to 40 mg every 12 hours, nebulizer treatments scheduled and as necessary - Pulmicort added; Plans to get ambulatory P[place] ox Today - Nurse informed.  -Supplemental oxygen as needed, wean down as possible - Continue oral azithromycin for total of 5 days - Incentive spirometry ordered  Bilateral lower extremity swelling; resolved.  -Although the patient reports this I do not see any physical evidence of this -Lower extremity duplex was negative for any blood clots  Chronic diastolic congestive heart failure - Echocardiogram from February 2019 shows ejection fraction 70% with grade 1 diastolic dysfunction -Does not really take any medications at home, concerns of medication noncompliance.   History of seizure disorder -Continue Depakote  Hypothyroidism -Continue  Synthroid 75 mcg daily  History of depression -Continue Zyprexa  GERD -Continue Protonix  DVT prophylaxis: Lovenox Code Status: Full code Family Communication: Significant other at bedside Disposition Plan: Maintain inpatient stay for more IV steroids, likely for another 24-48 hours.   Consultants:   None  Procedures:   None  Antimicrobials:   Azithromycin 4/22>   Subjective: No complaints at rest but does report exertional shortness of breath.  Review of Systems Otherwise negative except as per HPI, including: HEENT/EYES = negative for pain, redness, loss of vision, double vision, blurred vision, loss of hearing, sore throat, hoarseness, dysphagia Cardiovascular= negative for chest pain, palpitation, murmurs, lower extremity swelling Respiratory/lungs= negative for  cough, hemoptysis, wheezing, mucus production Gastrointestinal= negative for nausea, vomiting,, abdominal pain, melena, hematemesis Genitourinary= negative for Dysuria, Hematuria, Change in Urinary Frequency MSK = Negative for arthralgia, myalgias, Back Pain, Joint swelling  Neurology= Negative for headache, seizures, numbness, tingling  Psychiatry= Negative for anxiety, depression, suicidal and homocidal ideation Allergy/Immunology= Medication/Food allergy as listed  Skin= Negative for Rash, lesions, ulcers, itching   Objective: Vitals:   01/28/18 2309 01/29/18 0500 01/29/18 0757 01/29/18 0844  BP: 132/68  95/77   Pulse: 87  75   Resp: 16  19   Temp: 98.8 F (37.1 C)  98 F (36.7 C)   TempSrc: Oral  Oral   SpO2: 93%  97% 97%  Weight:  59.7 kg (131 lb 9.8 oz)    Height:        Intake/Output Summary (Last 24 hours) at 01/29/2018 1043 Last data filed at 01/29/2018 0500 Gross per 24 hour  Intake 690 ml  Output -  Net 690 ml   Filed Weights   01/27/18 0500 01/28/18  0500 01/29/18 0500  Weight: 58.9 kg (129 lb 13.6 oz) 58.3 kg (128 lb 8.5 oz) 59.7 kg (131 lb 9.8 oz)     Examination:  Constitutional: NAD, calm, comfortable Eyes: PERRL, lids and conjunctivae normal ENMT: Mucous membranes are moist. Posterior pharynx clear of any exudate or lesions.Normal dentition.  Neck: normal, supple, no masses, no thyromegaly Respiratory: Diffuse diminished breath sounds Cardiovascular: Regular rate and rhythm, no murmurs / rubs / gallops. No extremity edema. 2+ pedal pulses. No carotid bruits.  Abdomen: no tenderness, no masses palpated. No hepatosplenomegaly. Bowel sounds positive.  Musculoskeletal: no clubbing / cyanosis. No joint deformity upper and lower extremities. Good ROM, no contractures. Normal muscle tone.  Skin: no rashes, lesions, ulcers. No induration Neurologic: CN 2-12 grossly intact. Sensation intact, DTR normal. Strength 5/5 in all 4.  Psychiatric: Normal judgment and insight. Alert and oriented x 3. Normal mood.      Data Reviewed:   CBC: Recent Labs  Lab 01/26/18 0517 01/26/18 0523 01/27/18 0218 01/28/18 0200  WBC 9.4  --  6.0 6.2  NEUTROABS 7.1  --   --   --   HGB 14.3 18.0* 14.1 13.0  HCT 48.6* 53.0* 50.0* 45.8  MCV 86.8  --  88.7 88.1  PLT 230  --  254 209   Basic Metabolic Panel: Recent Labs  Lab 01/26/18 0523 01/27/18 0218 01/28/18 0200 01/29/18 0011  NA 138 139 140 140  K 4.2 5.0 5.3* 4.2  CL 93* 94* 97* 93*  CO2  --  37* 36* 39*  GLUCOSE 68 139* 119* 190*  BUN 23* 18 18 17   CREATININE 0.70 0.58 0.50 0.62  CALCIUM  --  8.6* 8.3* 8.5*  MG  --   --   --  1.8   GFR: Estimated Creatinine Clearance: 74.9 mL/min (by C-G formula based on SCr of 0.62 mg/dL). Liver Function Tests: No results for input(s): AST, ALT, ALKPHOS, BILITOT, PROT, ALBUMIN in the last 168 hours. No results for input(s): LIPASE, AMYLASE in the last 168 hours. No results for input(s): AMMONIA in the last 168 hours. Coagulation Profile: Recent Labs  Lab 01/26/18 0517  INR 1.06   Cardiac Enzymes: Recent Labs  Lab 01/26/18 0517  TROPONINI  <0.03   BNP (last 3 results) No results for input(s): PROBNP in the last 8760 hours. HbA1C: No results for input(s): HGBA1C in the last 72 hours. CBG: No results for input(s): GLUCAP in the last 168 hours. Lipid Profile: No results for input(s): CHOL, HDL, LDLCALC, TRIG, CHOLHDL, LDLDIRECT in the last 72 hours. Thyroid Function Tests: Recent Labs    01/26/18 1200  TSH 0.976   Anemia Panel: No results for input(s): VITAMINB12, FOLATE, FERRITIN, TIBC, IRON, RETICCTPCT in the last 72 hours. Sepsis Labs: No results for input(s): PROCALCITON, LATICACIDVEN in the last 168 hours.  Recent Results (from the past 240 hour(s))  MRSA PCR Screening     Status: None   Collection Time: 01/27/18  6:21 AM  Result Value Ref Range Status   MRSA by PCR NEGATIVE NEGATIVE Final    Comment:        The GeneXpert MRSA Assay (FDA approved for NASAL specimens only), is one component of a comprehensive MRSA colonization surveillance program. It is not intended to diagnose MRSA infection nor to guide or monitor treatment for MRSA infections. Performed at [facility], [place] [address], [place], [place] [address]          Radiology Studies: No results found.  Scheduled Meds: . anastrozole  1 mg Oral Daily  . azithromycin  500 mg Oral Daily  . budesonide (PULMICORT) nebulizer solution  0.5 mg Nebulization BID  . diphenhydrAMINE  50 mg Oral QHS  . divalproex  500 mg Oral BID  . enoxaparin (LOVENOX) injection  40 mg Subcutaneous Q24H  . famotidine  20 mg Oral BID  . feeding supplement (ENSURE ENLIVE)  237 mL Oral BID BM  . FLUoxetine  20 mg Oral Daily  . furosemide  40 mg Oral Daily  . ipratropium-albuterol  3 mL Nebulization BID  . levothyroxine  75 mcg Oral QAC breakfast  . methylPREDNISolone sodium succinate  40 mg Intravenous Q12H  . multivitamin with minerals  1 tablet Oral Daily  . nicotine  7 mg Transdermal Daily  . OLANZapine  20 mg Oral QHS  . pantoprazole  40 mg  Oral Daily   Continuous Infusions:   LOS: 2 days    Time spent: 25 mins    Mckensi Redinger Arsenio Loader, MD [facility] Pager [phone]   If 7PM-7AM, please contact night-coverage [email or web address] Password [facility] 01/29/2018, 10:43 AM

## 2018-01-29 NOTE — Progress Notes (Signed)
From home alone, resp failure, chf, bipolar, schizo, was on bipap, then HFNC, now on reg [place] 3 liters, iv abx ( changing to po), solumedrol.  Has home oxygen 2 liters. Await pt/ot eval.  PT eval rec SNF, patient wants to go home. She has pcs services for 3 hrs Mon- Sun. Will need HHPT/HHOT orders with face to face and DME orders for rolling walker and tub seat prior to dc.  NCM offered choice from [facility], she states she is ok with [facility], referral made to Butch Penny with [facility] for HHPT/HHOT and tub seat.

## 2018-01-29 NOTE — Progress Notes (Signed)
Pt having increased PVCs and intermittent ventricular bigeminy. On call NP Baltazar Najjar notified, a CMP and Mag level have been ordered. Will continue to monitor.

## 2018-01-29 NOTE — Progress Notes (Signed)
SATURATION QUALIFICATIONS: (This note is used to comply with regulatory documentation for home oxygen)  Patient Saturations on 2 Liters at Rest = 92%  Patient Saturations on 2 Liters of oxygen while Ambulating = 78%  Please briefly explain why patient needs home oxygen: Patient on 2 Liters at home for baseline. Patient ambulated to hallway and oxygen dropped to 78%. Patient requested to return to room at this point. Turned up oxygen to 4L and returned to room. Patient slowly returned back to 90%.

## 2018-01-29 NOTE — Care Management Note (Addendum)
Case Management Note  Patient Details  Name: Courtney Terry MRN: [id number] Date of Birth: 1960/12/08  Subjective/Objective:   From home alone, resp failure, chf, bipolar, schizo, was on bipap, then HFNC, now on reg [place] 3 liters, iv abx ( changing to po), solumedrol.  Has home oxygen 2 liters. Await pt/ot eval.  PT eval rec SNF, patient wants to go home. She has pcs services.   NCM offered choice from [facility], she states she is ok with [facility], referral made to Butch Penny with [facility] for HHPT/HHOT and tub seat.  Butch Penny with [facility]  Informed NCM that when she went to patient's room the person in the room with patient states she does not need [place] services and the patient then also states she did not want [place] services. NCM asked if she still wants the rolling walker and the tub seat, she does not want the rolling walker but she wants the tub seat.  States they have a walker at home. Patient may need more oxygen at home , will need ambulatory sats and oxygen order if needs more than 2 liters, she has oxygen with [facility].  RN Regino Schultze notified.                 Action/Plan: DC home when ready.  Expected Discharge Date:                  Expected Discharge Plan:  [place]  In-House Referral:     Discharge planning Services  CM Consult  Post Acute Care Choice:  Durable Medical Equipment, Home Health Choice offered to:  Patient  DME Arranged:  Walker rolling, Shower stool DME Agency:  [place]:  PT, OT, Patient Refused [place] Agency:  [place]  Status of Service:  Completed, signed off  If discussed at [place] of Stay Meetings, dates discussed:    Additional Comments:  Zenon Mayo, RN 01/29/2018, 11:52 AM

## 2018-01-30 DIAGNOSIS — J9601 Acute respiratory failure with hypoxia: Secondary | ICD-10-CM

## 2018-01-30 LAB — CBC
HCT: 48.1 % — ABNORMAL HIGH (ref 36.0–46.0)
Hemoglobin: 13.6 g/dL (ref 12.0–15.0)
MCH: 25 pg — AB (ref 26.0–34.0)
MCHC: 28.3 g/dL — AB (ref 30.0–36.0)
MCV: 88.6 fL (ref 78.0–100.0)
PLATELETS: 219 10*3/uL (ref 150–400)
RBC: 5.43 MIL/uL — AB (ref 3.87–5.11)
RDW: 19.3 % — ABNORMAL HIGH (ref 11.5–15.5)
WBC: 7.5 10*3/uL (ref 4.0–10.5)

## 2018-01-30 LAB — BASIC METABOLIC PANEL
Anion gap: 9 (ref 5–15)
BUN: 16 mg/dL (ref 6–20)
CO2: 42 mmol/L — ABNORMAL HIGH (ref 22–32)
CREATININE: 0.53 mg/dL (ref 0.44–1.00)
Calcium: 8.4 mg/dL — ABNORMAL LOW (ref 8.9–10.3)
Chloride: 92 mmol/L — ABNORMAL LOW (ref 101–111)
GFR calc Af Amer: >60 mL/min (ref >60)
Glucose, Bld: 108 mg/dL — ABNORMAL HIGH (ref 65–99)
POTASSIUM: 4.3 mmol/L (ref 3.5–5.1)
Sodium: 143 mmol/L (ref 135–145)

## 2018-01-30 LAB — MAGNESIUM: Magnesium: 2 mg/dL (ref 1.7–2.4)

## 2018-01-30 MED ORDER — PREDNISONE 20 MG PO TABS
40.0000 mg | ORAL_TABLET | Freq: Every day | ORAL | Status: DC
  Administered 2018-01-31 – 2018-02-01 (×2): 40 mg via ORAL
  Filled 2018-01-30 (×2): qty 2

## 2018-01-30 MED ORDER — ARFORMOTEROL TARTRATE 15 MCG/2ML IN NEBU
15.0000 ug | INHALATION_SOLUTION | Freq: Two times a day (BID) | RESPIRATORY_TRACT | Status: DC
  Administered 2018-01-30 – 2018-02-01 (×5): 15 ug via RESPIRATORY_TRACT
  Filled 2018-01-30 (×5): qty 2

## 2018-01-30 MED ORDER — FUROSEMIDE 40 MG PO TABS
60.0000 mg | ORAL_TABLET | Freq: Every day | ORAL | Status: DC
  Filled 2018-01-30: qty 1
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
## 2018-01-30 NOTE — Progress Notes (Signed)
Nutrition Follow-up  DOCUMENTATION CODES:   Not applicable  INTERVENTION:  Continue Ensure Enlive po BID, each supplement provides 350 kcal and 20 grams of protein Recommend Magic cup TID with meals, each supplement provides 290 kcal and 9 grams of protein   NUTRITION DIAGNOSIS:   Increased nutrient needs related to chronic illness as evidenced by estimated needs.  Ongoing  GOAL:   Patient will meet greater than or equal to 90% of their needs  Progressing  MONITOR:   PO intake, Supplement acceptance, Labs, I & O's, Skin, Weight trends  REASON FOR ASSESSMENT:   Consult Malnutrition Eval  ASSESSMENT:   57 y.o. F admitted under observation for acute respiratory failure with COPD exacerbation and LE edema. PMH of anxiety/depression, bipolar disorder, schizophrenia with hallucinations, and PTSD, Hx of breast cancer, COPD, seizures, CHF, acute encephalopathy, and polysubstance abuse. Pt with functional deficits due to decreased activity tolerance, cognition, knowledge of precautions and safety aawareness, strength, and balance as well as cardiopulmonary status limiting activity.   Pt with partner at bedside. Pt wasn't very responsive to dietetic intern interview questions.   When asked if pt was eating well or if she had decreased appetite, pt said she did, while partner shook his head "no". Pt could not give detailed weight or PTA PO intake. When asked if pt has lost any weight recently, pt partner shook his head yes while pt reports she lost some due to her thyroid medication.   Pt reports drinking the ensure which she likes and seemed open to a magic cup supplement as well.   Spoke with RN, pt eating 50% of her meal tray with snacks such as graham crackers and peanut butter.   Pt with previous diagnoses severe malnutrition in the context of chronic illness, unable to identify if ongoing at this time.   Medications reviewed: arimidex, zithromax, pulmicort, depakote, pepcid,  ensure enlive BID, prozac, lasix, levothroid, MVI with minerals, protonix, deltasone, zyprexa.   Labs reviewed: Cl 92 (L), CO2 42 (H), BG 108 (H), RBC 5.43 (H), HCT 48.1 (H).    Intake/Output Summary (Last 24 hours) at 01/30/2018 0950 Last data filed at 01/30/2018 0000 Gross per 24 hour  Intake 480 ml  Output -  Net 480 ml   Lab Results  Component Value Date   HGBA1C 6.2 (H) 02/26/2016   NUTRITION - FOCUSED PHYSICAL EXAM:    Most Recent Value  Orbital Region  Unable to assess  Upper Arm Region  Severe depletion  Thoracic and Lumbar Region  Moderate depletion  Buccal Region  Unable to assess  Temple Region  Moderate depletion  Clavicle Bone Region  Severe depletion  Clavicle and Acromion Bone Region  Severe depletion  Scapular Bone Region  Unable to assess  Dorsal Hand  Moderate depletion  Patellar Region  Severe depletion  Anterior Thigh Region  Severe depletion  Posterior Calf Region  Moderate depletion  Edema (RD Assessment)  None  Hair  Reviewed  Eyes  Reviewed  Mouth  Unable to assess  Skin  Reviewed  Nails  Reviewed       Diet Order:  Seizure precautions Diet regular Room service appropriate? Yes; Fluid consistency: Thin  EDUCATION NEEDS:   Not appropriate for education at this time  Skin:  Skin Assessment: Skin Integrity Issues: Skin Integrity Issues:: Other (Comment) Other: skin tear; L abdomen  Last BM:  01/27/18  Height:   Ht Readings from Last 1 Encounters:  01/26/18 5\' 10"  (1.778 m)  Weight:   Wt Readings from Last 1 Encounters:  01/30/18 132 lb 4.4 oz (60 kg)   Pt weight trend on admission 58.9 kg, pt now 60 kg.    Ideal Body Weight:  68.18 kg  BMI:  Body mass index is 18.98 kg/m.  Estimated Nutritional Needs:   Kcal:  1800-2000 kcal  Protein:  85-100 grams  Fluid:  >/= 1.8 L or per MD    Hope Budds, Dietetic Intern

## 2018-01-30 NOTE — Progress Notes (Signed)
PROGRESS NOTE    Courtney Terry  KGM:[id number] DOB: 1961-08-12 DOA: 01/26/2018 PCP: [name], MD   Brief Narrative:   57 year old with a history of COPD on 2 L nasal cannula at home, hypothyroidism, schizophrenia, essential hypertension, cocaine abuse, moderate to severe protein calorie malnutrition, recurrent breast cancer was admitted to the hospital for shortness of breath secondary to COPD exacerbation. Slow to improve.  Currently she is on routine treatment for COPD along with oral azithromycin.  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  Acute moderate exacerbation of mild persistent COPD; slowly improving.  Medication noncompliance -Transition IV steroids to prednisone 4/26 - Pulmicort added - desat screen shows markedly hypoxic-monitor for 1 more day in the hospital and reevaluate in the a.m. -Supplemental oxygen as needed, wean down as possible - Continue oral azithromycin for total of 5 days-stop date 4/26 - Incentive spirometry ordered  Bilateral lower extremity swelling; resolved.  -Although the patient reports this I do not see any physical evidence of this -Lower extremity duplex was negative for any blood clots  Chronic diastolic congestive heart failure - Echocardiogram from February 2019 shows ejection fraction 70% with grade 1 diastolic dysfunction -Does not really take any medications at home, concerns of medication noncompliance.  -Currently on Lasix-caregiver at bedside states he gives her all medications  that she take-will need to continue Lasix and will adjust to 60 daily 4/26  History of seizure disorder -Continue Depakote 500 twice daily  Hypothyroidism -Continue Synthroid 75 mcg daily  History of depression -Continue Zyprexa 20 at bedtime  GERD -Continue Protonix  DVT prophylaxis: Lovenox Code Status: Full code Family Communication: Significant other at bedside Disposition Plan: Still not ready for discharge markedly hypoxic and need to evaluate O2 sats again a.m. with increase in Lasix dosing  Consultants:   None  Procedures:   None  Antimicrobials:   Azithromycin 4/22>   Subjective: Awake alert-patient is unaware of what medications she takes and exhibits significant health literacy No chest pain Still smokes about "5 to 6 cigarettes daily"-caregiver states she smokes much more Also unclear if patient uses cocaine    Objective: Vitals:   01/30/18 0500 01/30/18 0820 01/30/18 0847 01/30/18 1016  BP:  122/80    Pulse:  84    Resp:  20    Temp:  98.7 F (37.1 C)    TempSrc:  Oral    SpO2:  96% 92% 92%  Weight: 60 kg (132 lb 4.4 oz)     Height:        Intake/Output Summary (Last 24 hours) at 01/30/2018 1420 Last data filed at 01/30/2018 0000 Gross per 24 hour  Intake 480 ml  Output -  Net 480 ml   Filed Weights   01/28/18 0500 01/29/18 0500 01/30/18 0500  Weight: 58.3 kg (128 lb 8.5 oz) 59.7 kg (131 lb 9.8 oz) 60 kg (132 lb 4.4 oz)    Examination:   Bi temporalis wasting external ocular movements intact mild JVD S1-S2 no murmur Abdomen soft however slightly tender in  left lower quadrant without rebound Chest is clinically clear without added sound Lower extremities are not swollen nor tender EOMI frail wasted    Data Reviewed:   CBC: Recent Labs  Lab 01/26/18 0517 01/26/18 0523 01/27/18 0218 01/28/18 0200 01/30/18 0739  WBC 9.4  --  6.0 6.2 7.5  NEUTROABS 7.1  --   --   --   --   HGB 14.3 18.0* 14.1 13.0 13.6  HCT 48.6* 53.0*  50.0* 45.8 48.1*  MCV 86.8  --  88.7 88.1 88.6  PLT 230  --  254 221 101   Basic Metabolic Panel: Recent Labs  Lab 01/26/18 0523 01/27/18 0218 01/28/18 0200 01/29/18 0011 01/30/18 0739  NA 138 139 140 140 143  K 4.2 5.0 5.3* 4.2 4.3  CL 93* 94* 97* 93* 92*  CO2  --  37* 36* 39* 42*  GLUCOSE 68 139* 119* 190* 108*  BUN 23* 18 18 17 16   CREATININE 0.70 0.58 0.50 0.62 0.53  CALCIUM  --  8.6* 8.3* 8.5* 8.4*  MG  --   --   --  1.8 2.0   GFR: Estimated Creatinine Clearance: 75.3 mL/min (by C-G formula based on SCr of 0.53 mg/dL). Liver Function Tests: No results for input(s): AST, ALT, ALKPHOS, BILITOT, PROT, ALBUMIN in the last 168 hours. No results for input(s): LIPASE, AMYLASE in the last 168 hours. No results for input(s): AMMONIA in the last 168 hours. Coagulation Profile: Recent Labs  Lab 01/26/18 0517  INR 1.06   Cardiac Enzymes: Recent Labs  Lab 01/26/18 0517  TROPONINI <0.03   BNP (last 3 results) No results for input(s): PROBNP in the last 8760 hours. HbA1C: No results for input(s): HGBA1C in the last 72 hours. CBG: No results for input(s): GLUCAP in the last 168 hours. Lipid Profile: No results for input(s): CHOL, HDL, LDLCALC, TRIG, CHOLHDL, LDLDIRECT in the last 72 hours. Thyroid Function Tests: No results for input(s): TSH, T4TOTAL, FREET4, T3FREE, THYROIDAB in the last 72 hours. Anemia Panel: No results for input(s): VITAMINB12, FOLATE, FERRITIN, TIBC, IRON, RETICCTPCT in the last 72 hours. Sepsis Labs: No results for input(s): PROCALCITON, LATICACIDVEN in the last 168 hours.  Recent Results (from the past 240 hour(s))  MRSA PCR Screening     Status: None   Collection Time: 01/27/18  6:21 AM  Result Value Ref Range Status   MRSA by PCR NEGATIVE NEGATIVE Final    Comment:        The GeneXpert MRSA Assay (FDA approved for NASAL specimens only), is one component of a comprehensive MRSA colonization surveillance program. It is not intended to  diagnose MRSA infection nor to guide or monitor treatment for MRSA infections. Performed at [facility], [place] [address] [place], [place] [address]          Radiology Studies: No results found.      Scheduled Meds: . anastrozole  1 mg Oral Daily  . arformoterol  15 mcg Nebulization BID  . budesonide (PULMICORT) nebulizer solution  0.5 mg Nebulization BID  . diphenhydrAMINE  50 mg Oral QHS  . divalproex  500 mg Oral BID  . enoxaparin (LOVENOX) injection  40 mg Subcutaneous Q24H  . famotidine  20 mg Oral BID  . feeding supplement (ENSURE ENLIVE)  237 mL Oral BID BM  . FLUoxetine  20 mg Oral Daily  . furosemide  40 mg Oral Daily  . ipratropium-albuterol  3 mL Nebulization BID  . levothyroxine  75 mcg  Oral QAC breakfast  . multivitamin with minerals  1 tablet Oral Daily  . nicotine  7 mg Transdermal Daily  . OLANZapine  20 mg Oral QHS  . pantoprazole  40 mg Oral Daily  . [START ON 01/31/2018] predniSONE  40 mg Oral QAC breakfast   Continuous Infusions:   LOS: 3 days    Time spent: 25 mins  Verneita Griffes, MD [facility] ([phone]   If 7PM-7AM, please contact night-coverage [email or web address] Password [email or web address] 01/30/2018, 2:20 PM

## 2018-01-30 NOTE — Progress Notes (Signed)
SATURATION QUALIFICATIONS: (This note is used to comply with regulatory documentation for home oxygen)  Patient Saturations on Room Air at Rest = 74%  Patient Saturations on Room Air while Ambulating = Did not test as desat on RA  Patient Saturations on 2 Liters of oxygen while Ambulating = 85%; on 4 Liters of oxygen while ambulating = 88%  Please briefly explain why patient needs home oxygen: Pt desat and could never get O2 with activity > 88%.  Needs home O2.  [place] [phone] (pager)
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
## 2018-01-31 LAB — BASIC METABOLIC PANEL
Anion gap: 5 (ref 5–15)
BUN: 17 mg/dL (ref 6–20)
CHLORIDE: 91 mmol/L — AB (ref 101–111)
CO2: 48 mmol/L — AB (ref 22–32)
CREATININE: 0.52 mg/dL (ref 0.44–1.00)
Calcium: 8.4 mg/dL — ABNORMAL LOW (ref 8.9–10.3)
GFR calc Af Amer: >60 mL/min (ref >60)
GFR calc non Af Amer: >60 mL/min (ref >60)
GLUCOSE: 85 mg/dL (ref 65–99)
Potassium: 3.6 mmol/L (ref 3.5–5.1)
Sodium: 144 mmol/L (ref 135–145)

## 2018-01-31 MED ORDER — FUROSEMIDE 40 MG PO TABS
40.0000 mg | ORAL_TABLET | Freq: Every day | ORAL | Status: DC
  Administered 2018-02-01: 40 mg via ORAL
  Filled 2018-01-31 (×2): qty 1

## 2018-01-31 MED ORDER — ACETAZOLAMIDE 250 MG PO TABS
250.0000 mg | ORAL_TABLET | Freq: Two times a day (BID) | ORAL | Status: DC
  Administered 2018-01-31 – 2018-02-01 (×3): 250 mg via ORAL
  Filled 2018-01-31 (×4): qty 1

## 2018-01-31 NOTE — Progress Notes (Signed)
PROGRESS NOTE    Courtney Terry  HYQ:[id number] DOB: 1961-08-11 DOA: 01/26/2018 PCP: [name], MD   Brief Narrative:   57 year old with a history of COPD on 2 L nasal cannula at home, hypothyroidism, schizophrenia, essential hypertension, cocaine abuse, moderate to severe protein calorie malnutrition, recurrent breast cancer was admitted to the hospital for shortness of breath secondary to COPD exacerbation. Slow to improve.  Currently she is on routine treatment for COPD along with oral azithromycin.  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  Acute moderate exacerbation of mild persistent COPD; slowly improving.  Medication noncompliance -Transition IV steroids to prednisone 4/26 - Pulmicort added - desat screen shows markedly hypoxic-monitor for 1 more day in the hospital and reevaluate in the a.m. -Supplemental oxygen as needed, wean down as possible - Continue oral azithromycin for total of 5 days-stop date 4/26 - Incentive spirometry ordered  Metabolic alkalosis likely secondary to contraction component with underlying chronic CO2 retention CO2 today is 48 Start Diamox 250 twice daily Needs unfortunately diuresis and may need potassium chloride to replace cation and her acid-base Cannot discharge until CO2 is reliably better-high risk for compromise if still elevated  Subacute weight loss 18 pounds-usual weight 150- Her caregiver reports bisexual behavior and promiscuity Checking HIV, hep C and hep B Counseled briefly  regarding the same  Bilateral lower extremity swelling; resolved.  -Although the patient reports this I do not see any physical evidence of this -Lower extremity duplex was negative for any blood clots  Chronic diastolic congestive heart failure - Echocardiogram from February 2019 shows ejection fraction 70% with grade 1 diastolic dysfunction -Does not really take any medications at home, concerns of medication noncompliance.  -Currently on Lasix 40 mg decrease dose from 60 given alkalosis  History of seizure disorder -Continue Depakote 500 twice daily  Hypothyroidism -Continue Synthroid 75 mcg daily  History of depression -Continue Zyprexa 20 at bedtime  GERD -Continue Protonix  DVT prophylaxis: Lovenox Code Status: Full code Family Communication: Significant other at bedside Disposition Plan: Hypoxic and has metabolic alkalosis at this time will need to stay on 4 L of oxygen  Consultants:   None  Procedures:   None  Antimicrobials:   Azithromycin 4/22>   Subjective:  No significant changes at this time in terms of breathing Significant other/caregiver who has been with her for 8 years tells me she has lost 20 pounds in 2 months She has no chest pain , Eating, no dysuria, no fever no chills    Objective: Vitals:   01/30/18 2329 01/31/18 0500 01/31/18 0743 01/31/18 0900  BP: 101/74   99/61  Pulse: 71     Resp:    12  Temp: 97.9 F (36.6 C)   98.8 F (37.1 C)  TempSrc: Oral   Oral  SpO2: 98%  93% (!) 89%  Weight:  60 kg (132 lb 4.4 oz)    Height:        Intake/Output Summary (Last 24 hours) at 01/31/2018 9528 Last data filed at 01/31/2018 0500  Gross per 24 hour  Intake 530 ml  Output -  Net 530 ml   Filed Weights   01/29/18 0500 01/30/18 0500 01/31/18 0500  Weight: 59.7 kg (131 lb 9.8 oz) 60 kg (132 lb 4.4 oz) 60 kg (132 lb 4.4 oz)    Examination:   Bi temporalis wasting external ocular movements intact mild JVD S1-S2 no murmur Abdomen  soft nontender not distended Chest is clear without added sounds decreased air entry Chest is clinically clear without added sound Lower extremities are not swollen nor tender     Data Reviewed:   CBC: Recent Labs  Lab 01/26/18 0517 01/26/18 0523 01/27/18 0218 01/28/18 0200 01/30/18 0739  WBC 9.4  --  6.0 6.2 7.5  NEUTROABS 7.1  --   --   --   --   HGB 14.3 18.0* 14.1 13.0 13.6  HCT 48.6* 53.0* 50.0* 45.8 48.1*  MCV 86.8  --  88.7 88.1 88.6  PLT 230  --  254 221 193   Basic Metabolic Panel: Recent Labs  Lab 01/27/18 0218 01/28/18 0200 01/29/18 0011 01/30/18 0739 01/31/18 0353  NA 139 140 140 143 144  K 5.0 5.3* 4.2 4.3 3.6  CL 94* 97* 93* 92* 91*  CO2 37* 36* 39* 42* 48*  GLUCOSE 139* 119* 190* 108* 85  BUN 18 18 17 16 17   CREATININE 0.58 0.50 0.62 0.53 0.52  CALCIUM 8.6* 8.3* 8.5* 8.4* 8.4*  MG  --   --  1.8 2.0  --    GFR: Estimated Creatinine Clearance: 75.3 mL/min (by C-G formula based on SCr of 0.52 mg/dL). Liver Function Tests: No results for input(s): AST, ALT, ALKPHOS, BILITOT, PROT, ALBUMIN in the last 168 hours. No results for input(s): LIPASE, AMYLASE in the last 168 hours. No results for input(s): AMMONIA in the last 168 hours. Coagulation Profile: Recent Labs  Lab 01/26/18 0517  INR 1.06   Cardiac Enzymes: Recent Labs  Lab 01/26/18 0517  TROPONINI <0.03   BNP (last 3 results) No results for input(s): PROBNP in the last 8760 hours. HbA1C: No results for input(s): HGBA1C in the last 72 hours. CBG: No results for input(s): GLUCAP in the last 168 hours. Lipid Profile: No results for input(s): CHOL, HDL, LDLCALC, TRIG, CHOLHDL, LDLDIRECT in the last 72 hours. Thyroid Function Tests: No results for input(s): TSH, T4TOTAL, FREET4, T3FREE, THYROIDAB in the last 72 hours. Anemia Panel: No results for input(s): VITAMINB12, FOLATE, FERRITIN, TIBC, IRON, RETICCTPCT in the last 72 hours. Sepsis Labs: No results for input(s): PROCALCITON,  LATICACIDVEN in the last 168 hours.  Recent Results (from the past 240 hour(s))  MRSA PCR Screening     Status: None   Collection Time: 01/27/18  6:21 AM  Result Value Ref Range Status   MRSA by PCR NEGATIVE NEGATIVE Final    Comment:        The GeneXpert MRSA Assay (FDA approved for NASAL specimens only), is one component of a comprehensive MRSA colonization surveillance program. It is not intended to diagnose MRSA infection nor to guide or monitor treatment for MRSA infections. Performed at [facility], [place] [address] [place], [place] [address]          Radiology Studies: No results found.      Scheduled Meds: . acetaZOLAMIDE  250 mg Oral BID  . anastrozole  1 mg Oral Daily  . arformoterol  15 mcg Nebulization BID  . budesonide (PULMICORT) nebulizer solution  0.5 mg Nebulization BID  .  diphenhydrAMINE  50 mg Oral QHS  . divalproex  500 mg Oral BID  . enoxaparin (LOVENOX) injection  40 mg Subcutaneous Q24H  . famotidine  20 mg Oral BID  . feeding supplement (ENSURE ENLIVE)  237 mL Oral BID BM  . FLUoxetine  20 mg Oral Daily  . [START ON 02/01/2018] furosemide  40 mg Oral Daily  . ipratropium-albuterol  3 mL Nebulization BID  . levothyroxine  75 mcg Oral QAC breakfast  . multivitamin with minerals  1 tablet Oral Daily  . nicotine  7 mg Transdermal Daily  . OLANZapine  20 mg Oral QHS  . pantoprazole  40 mg Oral Daily  . predniSONE  40 mg Oral QAC breakfast   Continuous Infusions:   LOS: 4 days    Time spent: 25 mins  Verneita Griffes, MD [facility]   If 7PM-7AM, please contact night-coverage [email or web address] Password [email or web address] 01/31/2018, 9:22 AM

## 2018-02-01 LAB — CBC WITH DIFFERENTIAL/PLATELET
BASOS ABS: 0 10*3/uL (ref 0.0–0.1)
BASOS PCT: 0 %
Eosinophils Absolute: 0.1 10*3/uL (ref 0.0–0.7)
Eosinophils Relative: 1 %
HEMATOCRIT: 45.7 % (ref 36.0–46.0)
HEMOGLOBIN: 12.4 g/dL (ref 12.0–15.0)
LYMPHS PCT: 19 %
Lymphs Abs: 1.9 10*3/uL (ref 0.7–4.0)
MCH: 24.3 pg — ABNORMAL LOW (ref 26.0–34.0)
MCHC: 27.1 g/dL — ABNORMAL LOW (ref 30.0–36.0)
MCV: 89.4 fL (ref 78.0–100.0)
MONO ABS: 0.9 10*3/uL (ref 0.1–1.0)
Monocytes Relative: 9 %
NEUTROS ABS: 7.1 10*3/uL (ref 1.7–7.7)
Neutrophils Relative %: 71 %
Platelets: 198 10*3/uL (ref 150–400)
RBC: 5.11 MIL/uL (ref 3.87–5.11)
RDW: 19.5 % — AB (ref 11.5–15.5)
WBC: 10.1 10*3/uL (ref 4.0–10.5)

## 2018-02-01 LAB — COMPREHENSIVE METABOLIC PANEL
ALBUMIN: 2.4 g/dL — AB (ref 3.5–5.0)
ALT: 14 U/L (ref 14–54)
ANION GAP: 7 (ref 5–15)
AST: 15 U/L (ref 15–41)
Alkaline Phosphatase: 64 U/L (ref 38–126)
BILIRUBIN TOTAL: 0.5 mg/dL (ref 0.3–1.2)
BUN: 17 mg/dL (ref 6–20)
CALCIUM: 8.5 mg/dL — AB (ref 8.9–10.3)
CO2: 33 mmol/L — AB (ref 22–32)
Chloride: 101 mmol/L (ref 101–111)
Creatinine, Ser: 0.5 mg/dL (ref 0.44–1.00)
GFR calc Af Amer: >60 mL/min (ref >60)
GFR calc non Af Amer: >60 mL/min (ref >60)
GLUCOSE: 85 mg/dL (ref 65–99)
POTASSIUM: 4.1 mmol/L (ref 3.5–5.1)
SODIUM: 141 mmol/L (ref 135–145)
TOTAL PROTEIN: 5.1 g/dL — AB (ref 6.5–8.1)

## 2018-02-01 LAB — HIV ANTIBODY (ROUTINE TESTING W REFLEX): HIV Screen 4th Generation wRfx: NONREACTIVE

## 2018-02-01 LAB — HEPATITIS PANEL, ACUTE
HEP A IGM: NEGATIVE
HEP B S AG: NEGATIVE
Hep B C IgM: NEGATIVE

## 2018-02-01 MED ORDER — FUROSEMIDE 40 MG PO TABS: 40.0000 mg | ORAL_TABLET | ORAL | 1 refills | Status: DC

## 2018-02-01 MED ORDER — BUDESONIDE 0.5 MG/2ML IN SUSP: 0.5000 mg | Freq: Two times a day (BID) | RESPIRATORY_TRACT | 0 refills | Status: DC

## 2018-02-01 MED ORDER — ARFORMOTEROL TARTRATE 15 MCG/2ML IN NEBU: 15.0000 ug | INHALATION_SOLUTION | Freq: Two times a day (BID) | RESPIRATORY_TRACT | 0 refills | Status: DC

## 2018-02-01 MED ORDER — ACETAZOLAMIDE 250 MG PO TABS: 250.0000 mg | ORAL_TABLET | Freq: Two times a day (BID) | ORAL | 0 refills | Status: DC

## 2018-02-01 MED ORDER — ACETAZOLAMIDE 250 MG PO TABS: 250.0000 mg | ORAL_TABLET | Freq: Every day | ORAL | 0 refills | Status: DC

## 2018-02-01 MED ORDER — PREDNISONE 20 MG PO TABS: 40.0000 mg | ORAL_TABLET | Freq: Every day | ORAL | 0 refills | Status: DC

## 2018-02-01 MED ORDER — NICOTINE 7 MG/24HR TD PT24: 7.0000 mg | MEDICATED_PATCH | Freq: Every day | TRANSDERMAL | 0 refills | Status: DC

## 2018-02-01 NOTE — Progress Notes (Signed)
Called by bedside RN that patients power at home is out. Pt needs power for her oxygen concentrator. CM spoke to the patient and she verifies that it is off but that significant other in the room states its back on. CM asked how they knew it was off and she states her sister Courtney Terry informed her. CM called Courtney Terry and she states she went and paid the bill and the power is to be turned back on today. CM called the automated number and it verifies the bill is paid but states the date to be turned back on is 4/29.  CM called Courtney Terry with [facility] and he is going to bring her extra tanks to last for several hours at home. He also stated that if they get home and the power is not back on they can call [facility] and [facility] will deliver her more tanks tonight. CM informed the patient and her significant other and they agree with the plan and will call [place] if they get home and the power is still off.  Courtney Terry, pts sister, is not able to provide transportation home for the patient. CM notified CSW and they will provide her a cab voucher.

## 2018-02-01 NOTE — Progress Notes (Signed)
CSW spoke with patient via bedside- voiced no concerns with discharging back home with significant other at this time. CSW provided patient with outpatient/ inpatient substance abuse resources.  CSW provided RN with taxi voucher once patient is ready to leave.   Kingsley Spittle, [facility] Emergency Room Clinical Social Worker [phone]

## 2018-02-01 NOTE — Care Management Note (Signed)
Case Management Note  Patient Details  Name: Courtney Terry MRN: [id number] Date of Birth: [birth year]  Subjective/Objective:                    Action/Plan: Pt discharging home with her friend and caregiver. Pt continues to refuse [facility] services. Pt with orders for walker, shower stool and oxygen. Pt already has oxygen at home at 2 L [place]. She uses [facility] for her oxygen and supplies. CM notified Jermaine with [facility] of the increase liters of oxygen.  Jermaine also made aware of shower stool and walker. Pt's sister to provide transport home. CM inquired about a tank for the ride. Sister unable to provide. Jermaine with [facility] made aware and will provide her a tank for transport home.   Expected Discharge Date:  02/01/18               Expected Discharge Plan:  [place]  In-House Referral:     Discharge planning Services  CM Consult  Post Acute Care Choice:  Durable Medical Equipment, Home Health Choice offered to:  Patient  DME Arranged:  Walker rolling, Shower stool DME Agency:  [place] Arranged:  PT, OT, Patient Refused [place] Agency:  [place]  Status of Service:  Completed, signed off  If discussed at [place] of Stay Meetings, dates discussed:    Additional Comments:  Pollie Friar, RN 02/01/2018, 12:23 PM

## 2018-02-01 NOTE — Progress Notes (Signed)
Patient dc'd to home with friend via cab voucher. Patient has 3 O2 tanks, shower chair and Nyemah [place]ton that she has been discharged with from [facility]. Patient has no complaints or pain or appears to be in distress. Discharge instructions reviewed with patient and visitor at bedside. Teachback displayed. Patient states she has no additional questions.

## 2018-02-01 NOTE — Discharge Summary (Signed)
Physician Discharge Summary  Courtney Terry QMG:[id number] DOB: Jun 02, 1961 DOA: 01/26/2018  PCP: [name], MD  Admit date: 01/26/2018 Discharge date: 02/01/2018  Time spent: 40   Recommendations for Outpatient Follow-up:  1. patient will need substance abuse cessation counseling as an outpatient if willing--Social worker will see the patient prior to discharge 2. Note dosage change of Lasix daily to every other day-note addition of Diamox to 50 daily basic metabolic panel in about 1 week 3. Patient will need to be seen by her psychiatrist soon as is on injectable psychiatric meds 4. Patient will be discharged on 4 L of oxygen at all times 5. Should have work-up for acute malnutrition with severe weight loss recently although I suspect this is secondary to smoking and cocaine use  Discharge Diagnoses:  Principal Problem:   Acute respiratory failure with hypoxia ([place]) Active Problems:   Malignant neoplasm of upper-outer quadrant of female breast ([place])   COPD exacerbation (HCC)   Protein-calorie malnutrition, severe   Schizophrenia ([place])   Hypothyroidism   Bipolar I disorder ([place])   Seizures (HCC)   Hypertension   CHF (congestive heart failure) ([place])   H/O right mastectomy   Depression   Discharge Condition: none  Diet recommendation: htn  Filed Weights   01/30/18 0500 01/31/18 0500 02/01/18 0500  Weight: 60 kg (132 lb 4.4 oz) 60 kg (132 lb 4.4 oz) 58.7 kg (129 lb 6.6 oz)    History of present illness:  57 year old with a history of COPD on 2 L nasal cannula at home, hypothyroidism, schizophrenia, essential hypertension, cocaine abuse, moderate to severe protein calorie malnutrition, recurrent breast cancer was admitted to the hospital for shortness of breath secondary to COPD exacerbation. Slow to improve.  Currently she is on routine treatment for COPD along with oral azithromycin.    Hospital Course:  Acute respiratory distress, acute on chronic hypoxia; uses 2L  [place] at home.  Acute moderate exacerbation of mild persistent COPD; slowly improving.  Medication noncompliance -Transition IV steroids to prednisone 4/26--course of oral steroids at home - Pulmicort added -Supplemental oxygen as needed, wean down as possible - Continue oral azithromycin for total of 5 days-stop date 4/26 - patient has stabilized to maximal possible potential in-hospital   Metabolic alkalosis likely secondary to contraction component with underlying chronic CO2 retention CO2 was 48 and is now down to 32 Diamox change from 250 twice daily to 250 once daily Diuresis to continue with p.o. Lasix 40 every other day I have stressed the importance of follow-up labs to patient and caregiver    Subacute weight loss 18 pounds-usual weight 150 Her caregiver reports bisexual behavior and promiscuity HIV hepatitis panel negative Counseled briefly regarding the same   Bilateral lower extremity swelling; resolved.  -Although the patient reports this I do not see any physical evidence of this -Lower extremity duplex was negative for any blood clots   Chronic diastolic congestive heart failure - Echocardiogram from February 2019 shows ejection fraction 70% with grade 1 diastolic dysfunction -Does not really take any medications at home, concerns of medication noncompliance.  -Diuresed aggressively in hospital on Lasix every other day as above   History of seizure disorder -Continue Depakote 500 twice daily   Hypothyroidism -Continue Synthroid 75 mcg daily   History of depression -Continue Zyprexa 20 at bedtime--will need to continue inVega when visiting her psychiatrist   GERD -Continue Protonix  Procedures: multiple Consultations:  none  Discharge Exam: Vitals:   02/01/18 0700 02/01/18 0914  BP:  (!) 80/56  Pulse:  75  Resp:  15  Temp:  97.7 F (36.5 C)  SpO2: 95% 95%    General: awake alert pleasant in nad Cardiovascular: s1 s2 no m/r/g Respiratory: clear  no added sound  Discharge Instructions   Discharge Instructions    Diet - low sodium heart healthy   Complete by:  As directed    Discharge instructions   Complete by:  As directed    please stop smoking tobacco and crack cocaine this will probably put you back in the hospital if you continue We can get a social worker to help with cessation counseling--- I would recommend strongly] We have ordered for you oxygen 4 L at all times-this will not work if you continue to do your usual habits Please get labs in about 1 week to check your kidney function-he will notice a new medication of Diamox that needs to be monitored carefully as an outpatient-I have also given you 3-day supply of prednisone, refilled your Pulmicort and given you a new medication of Brovana-please follow-up with your primary physician and be seen with regards to these issues Please go and see your psychiatrist   Increase activity slowly   Complete by:  As directed      Allergies as of 02/01/2018      Reactions   Levaquin [levofloxacin] Hives      Medication List    STOP taking these medications   feeding supplement (ENSURE ENLIVE) Liqd   feeding supplement Liqd     TAKE these medications   acetaZOLAMIDE 250 MG tablet Commonly known as:  DIAMOX Take 1 tablet (250 mg total) by mouth daily.   albuterol (2.5 MG/3ML) 0.083% nebulizer solution Commonly known as:  PROVENTIL TAKE 3 MLS BY NEBULIZATION EVERY 4 HOURS AS NEEDED FOR WHEEZING OR SHORTNESS OF BREATH   albuterol 108 (90 Base) MCG/ACT inhaler Commonly known as:  PROAIR HFA Inhale 2 puffs into the lungs every 4 (four) hours as needed for wheezing or shortness of breath.   amLODipine 5 MG tablet Commonly known as:  NORVASC Take 1 tablet (5 mg total) by mouth daily.   anastrozole 1 MG tablet Commonly known as:  ARIMIDEX TAKE 1 TABLET (1 MG TOTAL) BY MOUTH DAILY.   arformoterol 15 MCG/2ML Nebu Commonly known as:  BROVANA Take 2 mLs (15 mcg total) by  nebulization 2 (two) times daily.   BEVESPI AEROSPHERE 9-4.8 MCG/ACT [place] Generic drug:  Glycopyrrolate-Formoterol INHALE TWO PUFFS INTO THE LUNGS TWO (TWO) TIMES DAILY. What changed:    See the new instructions.  Another medication with the same name was removed. Continue taking this medication, and follow the directions you see here.   budesonide 0.5 MG/2ML nebulizer solution Commonly known as:  PULMICORT Take 2 mLs (0.5 mg total) by nebulization 2 (two) times daily.   diphenhydrAMINE 50 MG capsule Commonly known as:  BENADRYL Take 50 mg by mouth at bedtime.   divalproex 500 MG DR tablet Commonly known as:  DEPAKOTE Take 500 mg by mouth 2 (two) times daily.   famotidine 20 MG tablet Commonly known as:  PEPCID One at bedtime What changed:    how much to take  how to take this  when to take this  additional instructions   FLUoxetine 40 MG capsule Commonly known as:  PROZAC Take 40 mg by mouth daily.   furosemide 40 MG tablet Commonly known as:  LASIX Take 1 tablet (40 mg total) by mouth every other  day. What changed:  when to take this   INVEGA SUSTENNA 234 MG/1.5ML [place] injection Generic drug:  paliperidone Inject 234 mg into the muscle every 30 (thirty) days.   levothyroxine 75 MCG tablet Commonly known as:  SYNTHROID, LEVOTHROID Take 1 tablet (75 mcg total) daily by mouth.   nicotine 7 mg/24hr patch Commonly known as:  NICODERM CQ - dosed in mg/24 hr Place 1 patch (7 mg total) onto the skin daily. Start taking on:  02/02/2018   OLANZapine 20 MG tablet Commonly known as:  ZYPREXA Take 20 mg by mouth at bedtime.   omeprazole 40 MG capsule Commonly known as:  PRILOSEC Take 30-60 min before first meal of the day What changed:    how much to take  how to take this  when to take this  additional instructions   OXYGEN Inhale 2 L into the lungs as needed.   predniSONE 20 MG tablet Commonly known as:  DELTASONE Take 2 tablets (40 mg total) by  mouth daily before breakfast.            Durable Medical Equipment  (From admission, onward)        Start     Ordered   02/01/18 1120  DME Oxygen  Once    Question Answer Comment  Mode or (Route) Nasal cannula   Liters per Minute 4   Frequency Continuous (stationary and portable oxygen unit needed)   Oxygen conserving device No   Oxygen delivery system Gas      02/01/18 1121     Allergies  Allergen Reactions  . Levaquin [Levofloxacin] Hives      The results of significant diagnostics from this hospitalization (including imaging, microbiology, ancillary and laboratory) are listed below for reference.    Significant Diagnostic Studies: Dg Chest 2 View  Result Date: 01/26/2018 CLINICAL DATA:  Acute onset of worsening shortness of breath and increased urinary frequency. EXAM: CHEST - 2 VIEW COMPARISON:  Chest radiograph performed 11/11/2017, and CTA of the chest performed 11/12/2017 FINDINGS: The lungs are hyperexpanded, with flattening of the hemidiaphragms, reflecting emphysema. Vascular congestion is noted. There is no evidence of focal opacification, pleural effusion or pneumothorax. Known subpleural nodules are not well characterized on radiograph. Clips are noted at the right axilla. The heart is normal in size; the mediastinal contour is within normal limits. No acute osseous abnormalities are seen. IMPRESSION: Emphysema noted. Vascular congestion is seen. Lungs otherwise grossly clear. Electronically Signed   By: Garald Balding M.D.   On: 01/26/2018 04:32    Microbiology: Recent Results (from the past 240 hour(s))  MRSA PCR Screening     Status: None   Collection Time: 01/27/18  6:21 AM  Result Value Ref Range Status   MRSA by PCR NEGATIVE NEGATIVE Final    Comment:        The GeneXpert MRSA Assay (FDA approved for NASAL specimens only), is one component of a comprehensive MRSA colonization surveillance program. It is not intended to diagnose MRSA infection nor  to guide or monitor treatment for MRSA infections. Performed at [facility], [place] [address], [place], [place] [address]      Labs: Basic Metabolic Panel: Recent Labs  Lab 01/28/18 0200 01/29/18 0011 01/30/18 0739 01/31/18 0353 02/01/18 0241  NA 140 140 143 144 141  K 5.3* 4.2 4.3 3.6 4.1  CL 97* 93* 92* 91* 101  CO2 36* 39* 42* 48* 33*  GLUCOSE 119* 190* 108* 85 85  BUN 18  17 16 17 17   CREATININE 0.50 0.62 0.53 0.52 0.50  CALCIUM 8.3* 8.5* 8.4* 8.4* 8.5*  MG  --  1.8 2.0  --   --    Liver Function Tests: Recent Labs  Lab 02/01/18 0241  AST 15  ALT 14  ALKPHOS 64  BILITOT 0.5  PROT 5.1*  ALBUMIN 2.4*   No results for input(s): LIPASE, AMYLASE in the last 168 hours. No results for input(s): AMMONIA in the last 168 hours. CBC: Recent Labs  Lab 01/26/18 0517 01/26/18 0523 01/27/18 0218 01/28/18 0200 01/30/18 0739 02/01/18 0241  WBC 9.4  --  6.0 6.2 7.5 10.1  NEUTROABS 7.1  --   --   --   --  7.1  HGB 14.3 18.0* 14.1 13.0 13.6 12.4  HCT 48.6* 53.0* 50.0* 45.8 48.1* 45.7  MCV 86.8  --  88.7 88.1 88.6 89.4  PLT 230  --  254 221 219 198   Cardiac Enzymes: Recent Labs  Lab 01/26/18 0517  TROPONINI <0.03   BNP: BNP (last 3 results) Recent Labs    11/11/17 0911 01/26/18 0458  BNP 1,048.2* 958.4*    ProBNP (last 3 results) No results for input(s): PROBNP in the last 8760 hours.  CBG: No results for input(s): GLUCAP in the last 168 hours.     Signed:  Nita Sells MD   [facility] 02/01/2018, 11:23 AM

## 2018-02-03 ENCOUNTER — Telehealth: Payer: Self-pay | Admitting: Internal Medicine

## 2018-02-03 NOTE — Telephone Encounter (Signed)
Called patient, unable to reach. Left message to give [place] a call back.   We need to see if patient is having trouble affording her medication, she has received samples a few times in the last few months. If she is needing assistance we can provide her with this.

## 2018-02-04 MED ORDER — GLYCOPYRROLATE-FORMOTEROL 9-4.8 MCG/ACT IN AERO: 2.0000 | INHALATION_SPRAY | Freq: Every day | RESPIRATORY_TRACT | 0 refills | Status: DC

## 2018-02-04 NOTE — Telephone Encounter (Signed)
Pt requesting 2 samples of Bevespi.  Samples and patient assistance application have been left up front for pt.  Nothing further needed at this time.

## 2018-02-04 NOTE — Telephone Encounter (Signed)
Medicaid will not pay for these until the 05/15 and she is going out of town.  She states she is having a hard time breathing without this, CB [phone]. Advised her to stay by the phone so she will not miss our call.

## 2018-02-05 ENCOUNTER — Other Ambulatory Visit: Payer: Self-pay | Admitting: Hematology

## 2018-02-05 DIAGNOSIS — C50411 Malignant neoplasm of upper-outer quadrant of right female breast: Secondary | ICD-10-CM

## 2018-02-11 ENCOUNTER — Ambulatory Visit: Payer: Self-pay | Admitting: Hematology

## 2018-02-11 ENCOUNTER — Inpatient Hospital Stay: Payer: Self-pay

## 2018-02-12 ENCOUNTER — Telehealth: Payer: Self-pay | Admitting: Hematology

## 2018-02-12 NOTE — Telephone Encounter (Signed)
Scheduled appt per 5/8 sch msg - left vm for pt re appts °

## 2018-02-17 ENCOUNTER — Ambulatory Visit: Payer: Medicaid Other | Admitting: Internal Medicine

## 2018-02-17 ENCOUNTER — Encounter: Payer: Self-pay | Admitting: Internal Medicine

## 2018-02-21 ENCOUNTER — Other Ambulatory Visit: Payer: Self-pay | Admitting: Internal Medicine

## 2018-02-24 ENCOUNTER — Telehealth: Payer: Self-pay | Admitting: Internal Medicine

## 2018-02-24 NOTE — Telephone Encounter (Signed)
Pt just called to resch her appt and request samples when I saw the message that she has been dismissed from the practice, I let pt know someone would return her call so a nurse can let her know about the dismissal

## 2018-02-24 NOTE — Telephone Encounter (Signed)
Patient dismissed from [facility] by Christinia Gully, MD , effective Feb 17, 2018. Dismissal letter sent out by certified / registered mail. MW

## 2018-02-24 NOTE — Telephone Encounter (Signed)
Spoke with pt. She is aware that she has been dismissed from our practice and that we could not supply her samples. Nothing further was needed.

## 2018-02-25 ENCOUNTER — Inpatient Hospital Stay: Payer: Medicaid Other | Attending: Hematology

## 2018-02-25 ENCOUNTER — Inpatient Hospital Stay: Payer: Medicaid Other | Admitting: Hematology

## 2018-02-26 ENCOUNTER — Telehealth: Payer: Self-pay | Admitting: Internal Medicine

## 2018-02-26 ENCOUNTER — Encounter: Payer: Self-pay | Admitting: Internal Medicine

## 2018-02-26 NOTE — Telephone Encounter (Signed)
Dr. Melvyn Novas, are you willing to see pt back in your clinic? Please advise.

## 2018-02-26 NOTE — Telephone Encounter (Signed)
Patient was last seen 12/30/17. Cancellation for 01/27/18 was due to being hospitalzed.  Barnett Applebaum asks if Dr. Melvyn Novas is not willing to see the patient if can call Gina with coordination of care for the patient.  She is well aware that patient has missed appointments and states has missed appts with her as well but states patient is taking her medication now and is reaching out to [facility].

## 2018-02-26 NOTE — Telephone Encounter (Signed)
She last missed appt 02/17/18 after the hospitalization 01/27/18 and we had warned her she was at risk of ending up back in the hospital if she missed appts and then proceeded to miss the appt anyway so she needs another provider at this point but we can her here for 30 days as per the letter we sent

## 2018-02-27 NOTE — Telephone Encounter (Signed)
Called and spoke to [place], with [employer]. Barnett Applebaum has scheduled appt for 03/16/2018 at 1115 to see SG. Nothing further needed at this time.

## 2018-03-04 ENCOUNTER — Telehealth: Payer: Self-pay | Admitting: Internal Medicine

## 2018-03-04 MED ORDER — GLYCOPYRROLATE-FORMOTEROL 9-4.8 MCG/ACT IN AERO: 2.0000 | INHALATION_SPRAY | Freq: Two times a day (BID) | RESPIRATORY_TRACT | 0 refills | Status: DC

## 2018-03-04 NOTE — Telephone Encounter (Signed)
Barnett Applebaum is returning call. Cb is [phone].

## 2018-03-04 NOTE — Telephone Encounter (Signed)
Attempted to call Gina back, no answer, message left to call back.

## 2018-03-04 NOTE — Telephone Encounter (Signed)
Called and spoke to Courtney Terry.  Patient is needing Bevespi sample to last until next OV.  Bevespi samples placed in bag up front for Patient pick up.  Nothing further needed.

## 2018-03-04 NOTE — Addendum Note (Signed)
Addended by: Elton Sin on: 03/04/2018 03:54 PM   Modules accepted: Orders

## 2018-03-06 ENCOUNTER — Other Ambulatory Visit: Payer: Self-pay | Admitting: Internal Medicine

## 2018-03-16 ENCOUNTER — Telehealth: Payer: Self-pay | Admitting: Acute Care

## 2018-03-16 ENCOUNTER — Ambulatory Visit (INDEPENDENT_AMBULATORY_CARE_PROVIDER_SITE_OTHER): Payer: Medicaid Other | Admitting: Acute Care

## 2018-03-16 ENCOUNTER — Encounter: Payer: Self-pay | Admitting: Acute Care

## 2018-03-16 ENCOUNTER — Ambulatory Visit (INDEPENDENT_AMBULATORY_CARE_PROVIDER_SITE_OTHER)
Admission: RE | Admit: 2018-03-16 | Discharge: 2018-03-16 | Disposition: A | Discharge status: Home / Self Care | Payer: Medicaid Other | Source: Ambulatory Visit | Attending: Acute Care | Admitting: Acute Care

## 2018-03-16 VITALS — BP 128/82 | HR 97 | Ht 69.0 in | Wt 120.0 lb

## 2018-03-16 DIAGNOSIS — E43 Unspecified severe protein-calorie malnutrition: Secondary | ICD-10-CM | POA: Diagnosis not present

## 2018-03-16 DIAGNOSIS — F17210 Nicotine dependence, cigarettes, uncomplicated: Secondary | ICD-10-CM

## 2018-03-16 DIAGNOSIS — J449 Chronic obstructive pulmonary disease, unspecified: Secondary | ICD-10-CM

## 2018-03-16 DIAGNOSIS — J441 Chronic obstructive pulmonary disease with (acute) exacerbation: Secondary | ICD-10-CM

## 2018-03-16 MED ORDER — DOXYCYCLINE HYCLATE 100 MG PO TABS: 100.0000 mg | ORAL_TABLET | Freq: Two times a day (BID) | ORAL | 0 refills | Status: DC

## 2018-03-16 MED ORDER — BUDESONIDE 0.5 MG/2ML IN SUSP: 0.5000 mg | Freq: Two times a day (BID) | RESPIRATORY_TRACT | 5 refills | Status: DC

## 2018-03-16 MED ORDER — PREDNISONE 10 MG PO TABS: ORAL_TABLET | ORAL | 0 refills | Status: DC

## 2018-03-16 MED ORDER — GLYCOPYRROLATE-FORMOTEROL 9-4.8 MCG/ACT IN AERO: 2.0000 | INHALATION_SPRAY | Freq: Every day | RESPIRATORY_TRACT | 0 refills | Status: DC

## 2018-03-16 MED ORDER — LEVALBUTEROL HCL 0.63 MG/3ML IN NEBU
0.6300 mg | INHALATION_SOLUTION | Freq: Once | RESPIRATORY_TRACT | Status: AC
  Administered 2018-03-16: 0.63 mg via RESPIRATORY_TRACT

## 2018-03-16 MED ORDER — ARFORMOTEROL TARTRATE 15 MCG/2ML IN NEBU: 15.0000 ug | INHALATION_SOLUTION | Freq: Two times a day (BID) | RESPIRATORY_TRACT | 0 refills | Status: DC

## 2018-03-16 NOTE — Patient Instructions (Addendum)
CXR now. >> Stat Neb treatment after returning from CXR You must wear your oxygen at all times for your lung disease. You must wear it at 4 L [place] at all times. We will call your DME to fill your portable tanks.Please have them come and teach patient how to use her oxygen. Saturation goal is 88-92% We will order your Brovana and Duoneb treatments. You need to take both of these twice daily without fail. Prednisone taper; 10 mg tablets: 4 tabs x 2 days, 3 tabs x 2 days, 2 tabs x 2 days 1 tab x 2 days then stop. Doxycycline 100 mg twice daily x 7 days. Follow up in 2-3 weeks with Dr. Melvyn Novas Note your daily symptoms > remember "red flags" for COPD:  Increase in cough, increase in sputum production, increase in shortness of breath or activity tolerance. If you notice these symptoms, please call to be seen.  Please contact office for sooner follow up if symptoms do not improve or worsen or seek emergency care   .

## 2018-03-16 NOTE — Assessment & Plan Note (Addendum)
Flare is purely secondary to noncompliance She continues to smoke and use cocaine Patient is noncompliant with all of her maintenance medication She is not taking any maintenance medication at all She states this is secondary to "" not having it". Patient is not wearing oxygen, presented to office with a saturation of 79% on Room Air. Rebounded immediately with use of oxygen at prescribed flow Chest x-ray with no acute process Plan: CXR now. >> Stat Neb treatment after returning from CXR STOP SMOKING You must wear your oxygen at all times for your lung disease. You must wear it at 4 L [place] at all times. We will call your DME to fill your portable tanks.Please have them come and teach patient how to use her oxygen. Saturation goal is 88-92% We will order your Brovana and Duoneb treatments. You need to take both of these twice daily without fail. Prednisone taper; 10 mg tablets: 4 tabs x 2 days, 3 tabs x 2 days, 2 tabs x 2 days 1 tab x 2 days then stop. Doxycycline 100 mg twice daily x 7 days. Note your daily symptoms > remember "red flags" for COPD:  Increase in cough, increase in sputum production, increase in shortness of breath or activity tolerance. If you notice these symptoms, please call to be seen.  Please contact office for sooner follow up if symptoms do not improve or worsen or seek emergency care
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
## 2018-03-16 NOTE — Telephone Encounter (Signed)
E make sure she has follow up with Dr. Melvyn Novas in 3-4 weeks to ensure she is better. Thanks

## 2018-03-16 NOTE — Assessment & Plan Note (Signed)
Will need close monitoring Body mass index is 17.72 kg/m.

## 2018-03-18 ENCOUNTER — Telehealth: Payer: Self-pay | Admitting: Acute Care

## 2018-03-18 NOTE — Telephone Encounter (Signed)
Patient called back advised patient a f/u is needed with MW; per last message and what the patient is saying she is not to see MW; pt is requesting to see another provider at this time; pt contact # [phone]

## 2018-03-18 NOTE — Telephone Encounter (Signed)
Received a fax from [employer]. Pt's insurance is not covering Brovana neb solution. Called [employer] at [phone]. Pt's ID: [id number] T. PA was initiated and approved through 03/18/2019. PA approval #: [id number]. [place] has been made aware. Nothing further was needed.

## 2018-03-18 NOTE — Telephone Encounter (Signed)
ATC pt, no answer. Left message for pt to call back.  

## 2018-03-18 NOTE — Telephone Encounter (Signed)
Called patient unable to reach left message to give us a call back.

## 2018-03-19 NOTE — Telephone Encounter (Signed)
Called patient, unable to reach. Left message to give us a call back.  

## 2018-03-20 NOTE — Telephone Encounter (Signed)
May be difficult for me to accommodate her as a new patient. Please offer next open new visit appointment, otherwise she can see other provider in the practice

## 2018-03-20 NOTE — Telephone Encounter (Signed)
Will await MW response.

## 2018-03-20 NOTE — Telephone Encounter (Signed)
Yes, I discharged her for repeated no shows from my practice so she's free to pick anyone else who will take her

## 2018-03-20 NOTE — Telephone Encounter (Signed)
Certified dismissal letter returned as undeliverable, unclaimed, return to sender after three attempts by [employer] on March 18, 2018. Letter placed in another envelope and resent as 1st class mail which does not require a signature. daj

## 2018-03-20 NOTE — Telephone Encounter (Signed)
Called and spoke with patient, patient states she would like to switch from Dr. Melvyn Novas to Dr. Elsworth Soho.   MW please advise are you ok with this?   RA please advise are you ok with this?

## 2018-03-20 NOTE — Telephone Encounter (Signed)
Spoke with pt. She has been scheduled with Dr. Elsworth Soho on 06/25/18 at 9:30am. Nothing further was needed.

## 2018-03-25 ENCOUNTER — Telehealth: Payer: Self-pay | Admitting: Pulmonary Disease

## 2018-03-25 ENCOUNTER — Ambulatory Visit (INDEPENDENT_AMBULATORY_CARE_PROVIDER_SITE_OTHER): Payer: Medicaid Other | Admitting: Pulmonary Disease

## 2018-03-25 ENCOUNTER — Encounter: Payer: Self-pay | Admitting: Pulmonary Disease

## 2018-03-25 DIAGNOSIS — J449 Chronic obstructive pulmonary disease, unspecified: Secondary | ICD-10-CM | POA: Diagnosis not present

## 2018-03-25 DIAGNOSIS — F1910 Other psychoactive substance abuse, uncomplicated: Secondary | ICD-10-CM

## 2018-03-25 DIAGNOSIS — J9611 Chronic respiratory failure with hypoxia: Secondary | ICD-10-CM

## 2018-03-25 MED ORDER — GLYCOPYRROLATE-FORMOTEROL 9-4.8 MCG/ACT IN AERO: 2.0000 | INHALATION_SPRAY | Freq: Two times a day (BID) | RESPIRATORY_TRACT | 2 refills | Status: DC

## 2018-03-25 MED ORDER — ALBUTEROL SULFATE HFA 108 (90 BASE) MCG/ACT IN AERS: 2.0000 | INHALATION_SPRAY | RESPIRATORY_TRACT | 0 refills | Status: DC | PRN

## 2018-03-25 NOTE — Assessment & Plan Note (Signed)
We will [facility]

## 2018-03-25 NOTE — Patient Instructions (Signed)
Refills on albuterol and Bevespi  prednisone 10 mg daily with food #60   We will send a [facility] for [facility]
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
## 2018-03-25 NOTE — Assessment & Plan Note (Signed)
-  The ACT team is trying to help for 3 days. Smoking cessation again emphasized

## 2018-03-25 NOTE — Assessment & Plan Note (Signed)
Refills on albuterol and Bevespi  prednisone 10 mg daily with food #60   Plan to maintain her on low-dose prednisone for the next 2 to 3 months to prevent hospitalizations

## 2018-03-25 NOTE — Telephone Encounter (Signed)
Called and spoke with Corene Cornea, he states that [facility] does not carry a POC that goes to 3 liters.   Called patient unable to reach left message to give [place] a call back. Will forward to [place] to follow up on.

## 2018-03-26 NOTE — Telephone Encounter (Signed)
Lmtcb x2 for pt 

## 2018-03-27 ENCOUNTER — Ambulatory Visit: Payer: Medicaid Other | Admitting: Internal Medicine

## 2018-03-27 NOTE — Telephone Encounter (Signed)
Patient returned phone call; pt contact # [phone]

## 2018-03-27 NOTE — Telephone Encounter (Signed)
Returned call from Patient.  Explained that per [facility] does not have a POC that goes to 3L[place].  Patient stated that she would like to be placed on 2L [place], so she can use POC at 2L [place].  Patient stated that Dr Elsworth Soho was trying to wean O2 down, and was currently on 4L and weaned down to 3L .  Explained that [facility] POC does not go up to 3L and it would be up to Dr Elsworth Soho to lower O2.   Dr Elsworth Soho please advise

## 2018-03-31 NOTE — Telephone Encounter (Signed)
I have called Courtney Terry with [facility] to see what they have to offer

## 2018-03-31 NOTE — Telephone Encounter (Signed)
Attempted to call patient today regarding POC with [facility]. I did not receive an answer at time of call. I have left a voicemail message for pt to return call. X1  [facility] doesn't carry POC that go to 3 liters at this time. Routing message to Cherina to follow up with pt.

## 2018-03-31 NOTE — Telephone Encounter (Signed)
Pt called back regarding POC order. Advised pt that [facility] doesn't carry POC at 3-4 liters con't as requested per [facility] with [facility]. Pt requesting to go with another DME that offers POC at 3-4 liters con't.  Routing message to [facility] to help with POC at con't of 3-4 liters of O2 for pt.

## 2018-04-01 NOTE — Telephone Encounter (Signed)
Courtney Terry did you find out any information?

## 2018-04-02 ENCOUNTER — Telehealth: Payer: Self-pay | Admitting: Internal Medicine

## 2018-04-02 MED ORDER — GLYCOPYRROLATE-FORMOTEROL 9-4.8 MCG/ACT IN AERO: 2.0000 | INHALATION_SPRAY | Freq: Two times a day (BID) | RESPIRATORY_TRACT | 0 refills | Status: DC

## 2018-04-02 NOTE — Telephone Encounter (Addendum)
Pt was walked in the office at the 6.19.19 office visit, but documentation shows walk was done on continuous.  For POC order, pt should have been qualified/titrated here in the office.    Spoke with Nira Conn, pt was qualified in the office at time of visit on POC - 3lpm pulsed Documentation has been updated New order to [facility] for 3lpm pulsed and POC  Patient is aware Nothing further needed; will sign off

## 2018-04-02 NOTE — Telephone Encounter (Signed)
1 sample of Bevespi obtained for patient and placed up front Patient is aware Sample documented per protocol  Nothing further needed; will sign off

## 2018-04-10 ENCOUNTER — Telehealth: Payer: Self-pay | Admitting: Pulmonary Disease

## 2018-04-10 MED ORDER — GLYCOPYRROLATE-FORMOTEROL 9-4.8 MCG/ACT IN AERO: 2.0000 | INHALATION_SPRAY | Freq: Two times a day (BID) | RESPIRATORY_TRACT | 0 refills | Status: DC

## 2018-04-10 NOTE — Telephone Encounter (Signed)
Samples of Bevespi have been placed up front for pt.  Called and spoke with pt letting her know samples were ready for pick up.  Pt expressed understanding. Nothing further needed.

## 2018-04-24 ENCOUNTER — Ambulatory Visit: Payer: Medicaid Other | Admitting: Pulmonary Disease

## 2018-04-27 ENCOUNTER — Telehealth: Payer: Self-pay | Admitting: Pulmonary Disease

## 2018-04-27 ENCOUNTER — Ambulatory Visit: Payer: Medicaid Other | Admitting: Pulmonary Disease

## 2018-04-27 MED ORDER — GLYCOPYRROLATE-FORMOTEROL 9-4.8 MCG/ACT IN AERO: 2.0000 | INHALATION_SPRAY | Freq: Two times a day (BID) | RESPIRATORY_TRACT | 0 refills | Status: DC

## 2018-04-27 NOTE — Telephone Encounter (Signed)
Spoke with Barnett Applebaum. Advised her that I have placed patient's samples up front for pickup. She stated that she will pickup the samples tomorrow.   Nothing else needed at time of call.

## 2018-04-27 NOTE — Telephone Encounter (Signed)
Courtney Terry - case manager for the patient from [facility] called - she is requesting we can her back when we know if we have samples of Bevespi for pt , as she is the one that will be picking them up for the patient. She can be reached at [phone]
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
## 2018-04-27 NOTE — Telephone Encounter (Signed)
Patient called back checking on samples - she said it is ok to call Barnett Applebaum and advise samples are ready - Barnett Applebaum can be reached at [phone] - if necessary the patient can be reached at [phone]

## 2018-04-30 ENCOUNTER — Other Ambulatory Visit: Payer: Self-pay | Admitting: Hematology

## 2018-04-30 DIAGNOSIS — C50411 Malignant neoplasm of upper-outer quadrant of right female breast: Secondary | ICD-10-CM
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
## 2018-05-01 ENCOUNTER — Ambulatory Visit (INDEPENDENT_AMBULATORY_CARE_PROVIDER_SITE_OTHER): Payer: Medicaid Other | Admitting: Pulmonary Disease

## 2018-05-01 ENCOUNTER — Encounter: Payer: Self-pay | Admitting: Pulmonary Disease

## 2018-05-01 ENCOUNTER — Ambulatory Visit: Payer: Medicaid Other | Admitting: Pulmonary Disease

## 2018-05-01 VITALS — BP 106/62 | HR 76 | Ht 70.0 in | Wt 102.0 lb

## 2018-05-01 DIAGNOSIS — F1410 Cocaine abuse, uncomplicated: Secondary | ICD-10-CM

## 2018-05-01 DIAGNOSIS — F17200 Nicotine dependence, unspecified, uncomplicated: Secondary | ICD-10-CM

## 2018-05-01 DIAGNOSIS — Z720 Tobacco use: Secondary | ICD-10-CM | POA: Insufficient documentation

## 2018-05-01 DIAGNOSIS — J9611 Chronic respiratory failure with hypoxia: Secondary | ICD-10-CM | POA: Diagnosis not present

## 2018-05-01 DIAGNOSIS — J449 Chronic obstructive pulmonary disease, unspecified: Secondary | ICD-10-CM

## 2018-05-01 MED ORDER — GLYCOPYRROLATE-FORMOTEROL 9-4.8 MCG/ACT IN AERO: 2.0000 | INHALATION_SPRAY | Freq: Two times a day (BID) | RESPIRATORY_TRACT | 0 refills | Status: DC

## 2018-05-01 NOTE — Assessment & Plan Note (Signed)
Keep up the hard work trying to stop smoking  1 800 QUIT NOW  >>> Patient to call this resource and utilize it to help support her quit smoking >>> Keep up your hard work with stopping smoking  You can also contact the [facility] >>>For smoking cessation classes call [phone]  We do not recommend using e-cigarettes as a form of stopping smoking   Continue daily prednisone of 10 mg >>> Take in the morning with food  Continue Bevespi >>>2 puffs in the morning, 12 hours later 2 puffs >>>Take this daily no matter what >>>Samples provided today   Referral to pulmonary rehab  Follow-up with our office in 6 to 8 weeks

## 2018-05-01 NOTE — Assessment & Plan Note (Signed)
Keep up the hard work trying to stop smoking  1 800 QUIT NOW  >>> Patient to call this resource and utilize it to help support her quit smoking >>> Keep up your hard work with stopping smoking  You can also contact the [facility] >>>For smoking cessation classes call [phone]  We do not recommend using e-cigarettes as a form of stopping smoking   Follow-up with our office in 6 to 8 weeks

## 2018-05-01 NOTE — Assessment & Plan Note (Signed)
Continue to avoid using cocaine

## 2018-05-01 NOTE — Assessment & Plan Note (Signed)
Continue oxygen therapy as prescribed. Continue POC use

## 2018-05-01 NOTE — Patient Instructions (Addendum)
Keep up the hard work trying to stop smoking  1 800 QUIT NOW  >>> Patient to call this resource and utilize it to help support her quit smoking >>> Keep up your hard work with stopping smoking  You can also contact the [facility] >>>For smoking cessation classes call [phone]  We do not recommend using e-cigarettes as a form of stopping smoking   Continue daily prednisone of 10 mg >>> Take in the morning with food  Continue Bevespi >>>2 puffs in the morning, 12 hours later 2 puffs >>>Take this daily no matter what >>>Samples provided today   Referral to pulmonary rehab  Follow-up with our office in 6 to 8 weeks     Please contact the office if your symptoms worsen or you have concerns that you are not improving.   Thank you for choosing [facility] for your healthcare, and for allowing [place] to partner with you on your healthcare journey. I am thankful to be able to provide care to you today.   Wyn Quaker FNP-C     Coping with Quitting Smoking Quitting smoking is a physical and mental challenge. You will face cravings, withdrawal symptoms, and temptation. Before quitting, work with your health care provider to make a plan that can help you cope. Preparation can help you quit and keep you from giving in. How can I cope with cravings? Cravings usually last for 5-10 minutes. If you get through it, the craving will pass. Consider taking the following actions to help you cope with cravings:  Keep your mouth busy: ? Chew sugar-free gum. ? Suck on hard candies or a straw. ? Brush your teeth.  Keep your hands and body busy: ? Immediately change to a different activity when you feel a craving. ? Squeeze or play with a ball. ? Do an activity or a hobby, like making bead jewelry, practicing needlepoint, or working with wood. ? Mix up your normal routine. ? Take a short exercise break. Go for a quick walk or run up and down stairs. ? Spend time in  public places where smoking is not allowed.  Focus on doing something kind or helpful for someone else.  Call a friend or family member to talk during a craving.  Join a support group.  Call a quit line, such as 1-800-QUIT-NOW.  Talk with your health care provider about medicines that might help you cope with cravings and make quitting easier for you.  How can I deal with withdrawal symptoms? Your body may experience negative effects as it tries to get used to not having nicotine in the system. These effects are called withdrawal symptoms. They may include:  Feeling hungrier than normal.  Trouble concentrating.  Irritability.  Trouble sleeping.  Feeling depressed.  Restlessness and agitation.  Craving a cigarette.  To manage withdrawal symptoms:  Avoid places, people, and activities that trigger your cravings.  Remember why you want to quit.  Get plenty of sleep.  Avoid coffee and other caffeinated drinks. These may worsen some of your symptoms.  How can I handle social situations? Social situations can be difficult when you are quitting smoking, especially in the first few weeks. To manage this, you can:  Avoid parties, bars, and other social situations where people might be smoking.  Avoid alcohol.  Leave right away if you have the urge to smoke.  Explain to your family and friends that you are quitting smoking. Ask for understanding and support.  Plan activities  with friends or family where smoking is not an option.  What are some ways I can cope with stress? Wanting to smoke may cause stress, and stress can make you want to smoke. Find ways to manage your stress. Relaxation techniques can help. For example:  Breathe slowly and deeply, in through your nose and out through your mouth.  Listen to soothing, relaxing music.  Talk with a family member or friend about your stress.  Light a candle.  Soak in a bath or take a shower.  Think about a peaceful  place.  What are some ways I can prevent weight gain? Be aware that many people gain weight after they quit smoking. However, not everyone does. To keep from gaining weight, have a plan in place before you quit and stick to the plan after you quit. Your plan should include:  Having healthy snacks. When you have a craving, it may help to: ? Eat plain popcorn, crunchy carrots, celery, or other cut vegetables. ? Chew sugar-free gum.  Changing how you eat: ? Eat small portion sizes at meals. ? Eat 4-6 small meals throughout the day instead of 1-2 large meals a day. ? Be mindful when you eat. Do not watch television or do other things that might distract you as you eat.  Exercising regularly: ? Make time to exercise each day. If you do not have time for a long workout, do short bouts of exercise for 5-10 minutes several times a day. ? Do some form of strengthening exercise, like weight lifting, and some form of aerobic exercise, like running or swimming.  Drinking plenty of water or other low-calorie or no-calorie drinks. Drink 6-8 glasses of water daily, or as much as instructed by your health care provider.  Summary  Quitting smoking is a physical and mental challenge. You will face cravings, withdrawal symptoms, and temptation to smoke again. Preparation can help you as you go through these challenges.  You can cope with cravings by keeping your mouth busy (such as by chewing gum), keeping your body and hands busy, and making calls to family, friends, or a helpline for people who want to quit smoking.  You can cope with withdrawal symptoms by avoiding places where people smoke, avoiding drinks with caffeine, and getting plenty of rest.  Ask your health care provider about the different ways to prevent weight gain, avoid stress, and handle social situations. This information is not intended to replace advice given to you by your health care provider. Make sure you discuss any questions you  have with your health care provider. Document Released: 09/20/2016 Document Revised: 09/20/2016 Document Reviewed: 09/20/2016 Elsevier Interactive Patient Education  [employer].

## 2018-05-04 ENCOUNTER — Other Ambulatory Visit: Payer: Self-pay | Admitting: Pulmonary Disease

## 2018-05-04 ENCOUNTER — Telehealth (HOSPITAL_COMMUNITY): Payer: Self-pay

## 2018-05-04 DIAGNOSIS — J449 Chronic obstructive pulmonary disease, unspecified: Secondary | ICD-10-CM

## 2018-05-04 NOTE — Progress Notes (Signed)
.  rehab

## 2018-05-04 NOTE — Telephone Encounter (Signed)
Sent in basket to request Maintenance order as patient only has Medicaid. Called and left message for patient to see if she is interested in the Cardiac Rehab Maintenance Program.

## 2018-05-14 ENCOUNTER — Encounter (HOSPITAL_COMMUNITY): Payer: Self-pay | Admitting: *Deleted

## 2018-05-14 NOTE — Progress Notes (Addendum)
I reached Courtney Terry today. I discussed the Pulmonary Maintenance program with her and explained that it is a self pay program.[name] already knew that the Pulmonary Undergraduate is not covered by Medicaid. She states that she can not afford any more monthly bills. She explained that she gets a disability check and she has a hard time affording her monthly bills now. I did encourage her to think about joining the [employer], [employer] or the [facility] which can be reduced to no fees. If this isn't an option for her I encouraged her to get a pulse oximeter and try  to walk inside during the hot and cold months and outside if the weather allows. I discussed with her that she needs to maintain her saturations at 88% or greater and to let Dr. Elsworth Soho know how they are running. We will close out her order for the Pulmonary Rehab program.

## 2018-05-15 ENCOUNTER — Other Ambulatory Visit: Payer: Self-pay | Admitting: Internal Medicine

## 2018-05-21 ENCOUNTER — Telehealth: Payer: Self-pay | Admitting: Pulmonary Disease

## 2018-05-21 NOTE — Telephone Encounter (Signed)
Spoke with pt, advised samples will be placed up front. She states Medicaid will not pay for Bevespi. Aaron Edelman are we going to keep providing pt with samples or you want to pick an alternative? Aaron Edelman please advise.

## 2018-05-21 NOTE — Telephone Encounter (Signed)
Pt is calling back [phone]

## 2018-05-21 NOTE — Telephone Encounter (Signed)
ATC pt, no answer. Left message for pt to call back.  

## 2018-05-21 NOTE — Telephone Encounter (Signed)
Ok she has an appt with you on 9/6, I gave her a sample of bevespi to last until then. At that time, we can discuss changing medications. I gave Courtney Terry a list of medications that Medicaid prefers.

## 2018-05-21 NOTE — Telephone Encounter (Signed)
What other options are available for the patient as she has Medicaid?  There has to be a LAMA/LABA that medicaid will cover.  We can definitely try other options.  As relying on samples in our office is not an adequate strategy.  And it cannot be relied on.  Please make sure patient has follow-up appointment scheduled that she keeps that.  Wyn Quaker FNP

## 2018-05-21 NOTE — Telephone Encounter (Signed)
Thank you.  Noted.  Wyn Quaker, FNP

## 2018-05-25 ENCOUNTER — Telehealth: Payer: Self-pay | Admitting: Pulmonary Disease

## 2018-05-28 NOTE — Telephone Encounter (Signed)
Noted Will sign off 

## 2018-06-08 ENCOUNTER — Encounter (HOSPITAL_COMMUNITY): Payer: Self-pay

## 2018-06-08 ENCOUNTER — Inpatient Hospital Stay (HOSPITAL_COMMUNITY): Payer: Medicaid Other

## 2018-06-08 ENCOUNTER — Other Ambulatory Visit: Payer: Self-pay

## 2018-06-08 ENCOUNTER — Inpatient Hospital Stay (HOSPITAL_COMMUNITY)
Admission: EM | Admit: 2018-06-08 | Discharge: 2018-06-12 | DRG: 208 | Disposition: A | Discharge status: Home / Self Care | Payer: Medicaid Other | Attending: Internal Medicine | Admitting: Internal Medicine

## 2018-06-08 ENCOUNTER — Emergency Department (HOSPITAL_COMMUNITY): Payer: Medicaid Other

## 2018-06-08 DIAGNOSIS — R579 Shock, unspecified: Secondary | ICD-10-CM | POA: Diagnosis not present

## 2018-06-08 DIAGNOSIS — I10 Essential (primary) hypertension: Secondary | ICD-10-CM | POA: Diagnosis present

## 2018-06-08 DIAGNOSIS — Z23 Encounter for immunization: Secondary | ICD-10-CM

## 2018-06-08 DIAGNOSIS — E43 Unspecified severe protein-calorie malnutrition: Secondary | ICD-10-CM | POA: Diagnosis present

## 2018-06-08 DIAGNOSIS — F1410 Cocaine abuse, uncomplicated: Secondary | ICD-10-CM | POA: Diagnosis present

## 2018-06-08 DIAGNOSIS — F319 Bipolar disorder, unspecified: Secondary | ICD-10-CM | POA: Diagnosis present

## 2018-06-08 DIAGNOSIS — G40909 Epilepsy, unspecified, not intractable, without status epilepticus: Secondary | ICD-10-CM | POA: Diagnosis present

## 2018-06-08 DIAGNOSIS — D638 Anemia in other chronic diseases classified elsewhere: Secondary | ICD-10-CM | POA: Diagnosis present

## 2018-06-08 DIAGNOSIS — F1910 Other psychoactive substance abuse, uncomplicated: Secondary | ICD-10-CM | POA: Diagnosis not present

## 2018-06-08 DIAGNOSIS — Z716 Tobacco abuse counseling: Secondary | ICD-10-CM

## 2018-06-08 DIAGNOSIS — J9601 Acute respiratory failure with hypoxia: Secondary | ICD-10-CM

## 2018-06-08 DIAGNOSIS — Z803 Family history of malignant neoplasm of breast: Secondary | ICD-10-CM | POA: Diagnosis not present

## 2018-06-08 DIAGNOSIS — F17210 Nicotine dependence, cigarettes, uncomplicated: Secondary | ICD-10-CM | POA: Diagnosis present

## 2018-06-08 DIAGNOSIS — F17200 Nicotine dependence, unspecified, uncomplicated: Secondary | ICD-10-CM | POA: Diagnosis not present

## 2018-06-08 DIAGNOSIS — L8990 Pressure ulcer of unspecified site, unspecified stage: Secondary | ICD-10-CM

## 2018-06-08 DIAGNOSIS — J9622 Acute and chronic respiratory failure with hypercapnia: Secondary | ICD-10-CM | POA: Diagnosis present

## 2018-06-08 DIAGNOSIS — Z9114 Patient's other noncompliance with medication regimen: Secondary | ICD-10-CM

## 2018-06-08 DIAGNOSIS — J9589 Other postprocedural complications and disorders of respiratory system, not elsewhere classified: Secondary | ICD-10-CM | POA: Diagnosis present

## 2018-06-08 DIAGNOSIS — Y848 Other medical procedures as the cause of abnormal reaction of the patient, or of later complication, without mention of misadventure at the time of the procedure: Secondary | ICD-10-CM | POA: Diagnosis present

## 2018-06-08 DIAGNOSIS — Z881 Allergy status to other antibiotic agents status: Secondary | ICD-10-CM

## 2018-06-08 DIAGNOSIS — J9602 Acute respiratory failure with hypercapnia: Secondary | ICD-10-CM

## 2018-06-08 DIAGNOSIS — J441 Chronic obstructive pulmonary disease with (acute) exacerbation: Secondary | ICD-10-CM | POA: Diagnosis present

## 2018-06-08 DIAGNOSIS — Z681 Body mass index (BMI) 19 or less, adult: Secondary | ICD-10-CM | POA: Diagnosis not present

## 2018-06-08 DIAGNOSIS — M6258 Muscle wasting and atrophy, not elsewhere classified, other site: Secondary | ICD-10-CM | POA: Diagnosis present

## 2018-06-08 DIAGNOSIS — J9600 Acute respiratory failure, unspecified whether with hypoxia or hypercapnia: Secondary | ICD-10-CM | POA: Diagnosis present

## 2018-06-08 DIAGNOSIS — E039 Hypothyroidism, unspecified: Secondary | ICD-10-CM | POA: Diagnosis present

## 2018-06-08 DIAGNOSIS — Z7151 Drug abuse counseling and surveillance of drug abuser: Secondary | ICD-10-CM

## 2018-06-08 DIAGNOSIS — Z79899 Other long term (current) drug therapy: Secondary | ICD-10-CM

## 2018-06-08 DIAGNOSIS — J449 Chronic obstructive pulmonary disease, unspecified: Secondary | ICD-10-CM

## 2018-06-08 DIAGNOSIS — Z9011 Acquired absence of right breast and nipple: Secondary | ICD-10-CM

## 2018-06-08 DIAGNOSIS — F209 Schizophrenia, unspecified: Secondary | ICD-10-CM | POA: Diagnosis present

## 2018-06-08 DIAGNOSIS — J9621 Acute and chronic respiratory failure with hypoxia: Secondary | ICD-10-CM | POA: Diagnosis present

## 2018-06-08 DIAGNOSIS — Z8249 Family history of ischemic heart disease and other diseases of the circulatory system: Secondary | ICD-10-CM

## 2018-06-08 DIAGNOSIS — Z853 Personal history of malignant neoplasm of breast: Secondary | ICD-10-CM

## 2018-06-08 DIAGNOSIS — R64 Cachexia: Secondary | ICD-10-CM | POA: Diagnosis present

## 2018-06-08 DIAGNOSIS — Z720 Tobacco use: Secondary | ICD-10-CM | POA: Diagnosis present

## 2018-06-08 DIAGNOSIS — Z9981 Dependence on supplemental oxygen: Secondary | ICD-10-CM

## 2018-06-08 DIAGNOSIS — F4310 Post-traumatic stress disorder, unspecified: Secondary | ICD-10-CM | POA: Diagnosis present

## 2018-06-08 DIAGNOSIS — Z79890 Hormone replacement therapy (postmenopausal): Secondary | ICD-10-CM

## 2018-06-08 DIAGNOSIS — J939 Pneumothorax, unspecified: Secondary | ICD-10-CM

## 2018-06-08 LAB — CBC WITH DIFFERENTIAL/PLATELET
BASOS ABS: 0 10*3/uL (ref 0.0–0.1)
Basophils Relative: 0 %
EOS PCT: 0 %
Eosinophils Absolute: 0 10*3/uL (ref 0.0–0.7)
HCT: 46.3 % — ABNORMAL HIGH (ref 36.0–46.0)
Hemoglobin: 13 g/dL (ref 12.0–15.0)
LYMPHS ABS: 0.7 10*3/uL (ref 0.7–4.0)
LYMPHS PCT: 9 %
MCH: 26.7 pg (ref 26.0–34.0)
MCHC: 28.1 g/dL — ABNORMAL LOW (ref 30.0–36.0)
MCV: 95.3 fL (ref 78.0–100.0)
MONO ABS: 0.5 10*3/uL (ref 0.1–1.0)
Monocytes Relative: 7 %
NEUTROS ABS: 6.3 10*3/uL (ref 1.7–7.7)
Neutrophils Relative %: 84 %
PLATELETS: 187 10*3/uL (ref 150–400)
RBC: 4.86 MIL/uL (ref 3.87–5.11)
RDW: 15.6 % — AB (ref 11.5–15.5)
WBC: 7.5 10*3/uL (ref 4.0–10.5)

## 2018-06-08 LAB — COMPREHENSIVE METABOLIC PANEL
ALBUMIN: 3.3 g/dL — AB (ref 3.5–5.0)
ALT: 16 U/L (ref 0–44)
ANION GAP: 7 (ref 5–15)
AST: 20 U/L (ref 15–41)
Alkaline Phosphatase: 63 U/L (ref 38–126)
BILIRUBIN TOTAL: 0.3 mg/dL (ref 0.3–1.2)
BUN: 16 mg/dL (ref 6–20)
CHLORIDE: 90 mmol/L — AB (ref 98–111)
CO2: 46 mmol/L — ABNORMAL HIGH (ref 22–32)
Calcium: 9.2 mg/dL (ref 8.9–10.3)
Creatinine, Ser: 0.44 mg/dL (ref 0.44–1.00)
GFR calc Af Amer: >60 mL/min (ref >60)
GFR calc non Af Amer: >60 mL/min (ref >60)
GLUCOSE: 133 mg/dL — AB (ref 70–99)
POTASSIUM: 3.9 mmol/L (ref 3.5–5.1)
Sodium: 143 mmol/L (ref 135–145)
Total Protein: 6.7 g/dL (ref 6.5–8.1)

## 2018-06-08 LAB — GLUCOSE, CAPILLARY: GLUCOSE-CAPILLARY: 91 mg/dL (ref 70–99)

## 2018-06-08 LAB — BLOOD GAS, VENOUS
Drawn by: 25788
O2 Saturation: 83.8 %
PH VEN: 7.186 — AB (ref 7.250–7.430)
Patient temperature: 98.6

## 2018-06-08 LAB — URINALYSIS, ROUTINE W REFLEX MICROSCOPIC
BILIRUBIN URINE: NEGATIVE
Glucose, UA: NEGATIVE mg/dL
KETONES UR: NEGATIVE mg/dL
LEUKOCYTES UA: NEGATIVE
Nitrite: NEGATIVE
PH: 6 (ref 5.0–8.0)
Protein, ur: 100 mg/dL — AB
SPECIFIC GRAVITY, URINE: 1.016 (ref 1.005–1.030)

## 2018-06-08 LAB — BLOOD GAS, ARTERIAL
Acid-Base Excess: 9.8 mmol/L — ABNORMAL HIGH (ref 0.0–2.0)
Bicarbonate: 37.1 mmol/L — ABNORMAL HIGH (ref 20.0–28.0)
DRAWN BY: 257881
FIO2: 40
O2 SAT: 98.6 %
PCO2 ART: 66.1 mmHg — AB (ref 32.0–48.0)
Patient temperature: 98.6
pH, Arterial: 7.368 (ref 7.350–7.450)
pO2, Arterial: 128 mmHg — ABNORMAL HIGH (ref 83.0–108.0)

## 2018-06-08 LAB — RAPID URINE DRUG SCREEN, HOSP PERFORMED
Amphetamines: NOT DETECTED
BARBITURATES: NOT DETECTED
Benzodiazepines: NOT DETECTED
Cocaine: POSITIVE — AB
Opiates: NOT DETECTED
TETRAHYDROCANNABINOL: NOT DETECTED

## 2018-06-08 LAB — CBG MONITORING, ED: Glucose-Capillary: 164 mg/dL — ABNORMAL HIGH (ref 70–99)

## 2018-06-08 LAB — AMMONIA: Ammonia: 50 umol/L — ABNORMAL HIGH (ref 9–35)

## 2018-06-08 LAB — TRIGLYCERIDES: TRIGLYCERIDES: 37 mg/dL (ref <150)

## 2018-06-08 LAB — BRAIN NATRIURETIC PEPTIDE: B Natriuretic Peptide: 35 pg/mL (ref 0.0–100.0)

## 2018-06-08 LAB — ETHANOL: Alcohol, Ethyl (B): <10 mg/dL (ref <10)

## 2018-06-08 LAB — I-STAT TROPONIN, ED: Troponin i, poc: 0 ng/mL (ref 0.00–0.08)

## 2018-06-08 LAB — I-STAT CG4 LACTIC ACID, ED
LACTIC ACID, VENOUS: 2.03 mmol/L — AB (ref 0.5–1.9)
Lactic Acid, Venous: 1.16 mmol/L (ref 0.5–1.9)

## 2018-06-08 LAB — ACETAMINOPHEN LEVEL: Acetaminophen (Tylenol), Serum: <10 ug/mL — ABNORMAL LOW (ref 10–30)

## 2018-06-08 MED ORDER — VECURONIUM BROMIDE 10 MG IV SOLR
10.0000 mg | Freq: Once | INTRAVENOUS | Status: DC
  Filled 2018-06-08: qty 10

## 2018-06-08 MED ORDER — IPRATROPIUM-ALBUTEROL 0.5-2.5 (3) MG/3ML IN SOLN
3.0000 mL | Freq: Once | RESPIRATORY_TRACT | Status: AC
  Administered 2018-06-08: 3 mL via RESPIRATORY_TRACT
  Filled 2018-06-08: qty 3

## 2018-06-08 MED ORDER — METHYLPREDNISOLONE SODIUM SUCC 125 MG IJ SOLR
125.0000 mg | Freq: Once | INTRAMUSCULAR | Status: AC
  Administered 2018-06-08: 125 mg via INTRAVENOUS
  Filled 2018-06-08 (×2): qty 2

## 2018-06-08 MED ORDER — ASPIRIN 81 MG PO CHEW: 324.0000 mg | CHEWABLE_TABLET | ORAL | Status: DC

## 2018-06-08 MED ORDER — ETOMIDATE 2 MG/ML IV SOLN
0.3000 mg/kg | Freq: Once | INTRAVENOUS | Status: AC
  Administered 2018-06-08: 20 mg via INTRAVENOUS
  Filled 2018-06-08: qty 10

## 2018-06-08 MED ORDER — ENOXAPARIN SODIUM 30 MG/0.3ML ~~LOC~~ SOLN: 30.0000 mg | SUBCUTANEOUS | Status: DC

## 2018-06-08 MED ORDER — INSULIN ASPART 100 UNIT/ML ~~LOC~~ SOLN
0.0000 [IU] | SUBCUTANEOUS | Status: DC
  Administered 2018-06-09: 1 [IU] via SUBCUTANEOUS

## 2018-06-08 MED ORDER — SODIUM CHLORIDE 0.9 % IV SOLN
500.0000 mg | INTRAVENOUS | Status: DC
  Administered 2018-06-09 – 2018-06-11 (×3): 500 mg via INTRAVENOUS
  Filled 2018-06-08 (×3): qty 500

## 2018-06-08 MED ORDER — SUCCINYLCHOLINE CHLORIDE 20 MG/ML IJ SOLN: 100.0000 mg | Freq: Once | INTRAMUSCULAR | Status: DC

## 2018-06-08 MED ORDER — FENTANYL BOLUS VIA INFUSION
50.0000 ug | INTRAVENOUS | Status: DC | PRN
  Administered 2018-06-08 – 2018-06-09 (×2): 50 ug via INTRAVENOUS
  Filled 2018-06-08: qty 50

## 2018-06-08 MED ORDER — FENTANYL CITRATE (PF) 100 MCG/2ML IJ SOLN: 50.0000 ug | Freq: Once | INTRAMUSCULAR | Status: DC

## 2018-06-08 MED ORDER — NALOXONE HCL 2 MG/2ML IJ SOSY
1.0000 mg | PREFILLED_SYRINGE | Freq: Once | INTRAMUSCULAR | Status: AC
  Administered 2018-06-08: 1 mg via INTRAVENOUS
  Filled 2018-06-08: qty 2

## 2018-06-08 MED ORDER — FENTANYL CITRATE (PF) 100 MCG/2ML IJ SOLN
100.0000 ug | Freq: Once | INTRAMUSCULAR | Status: AC
  Administered 2018-06-08: 100 ug via INTRAVENOUS
  Filled 2018-06-08: qty 2

## 2018-06-08 MED ORDER — SODIUM CHLORIDE 0.9 % IV BOLUS
500.0000 mL | Freq: Once | INTRAVENOUS | Status: AC
  Administered 2018-06-08: 500 mL via INTRAVENOUS

## 2018-06-08 MED ORDER — SODIUM CHLORIDE 0.9 % IV SOLN
25.0000 ug/h | INTRAVENOUS | Status: DC
  Administered 2018-06-08: 50 ug/h via INTRAVENOUS
  Filled 2018-06-08 (×2): qty 50

## 2018-06-08 MED ORDER — MIDAZOLAM HCL 2 MG/2ML IJ SOLN
5.0000 mg | Freq: Once | INTRAMUSCULAR | Status: AC
  Administered 2018-06-08: 5 mg via INTRAVENOUS
  Filled 2018-06-08: qty 6

## 2018-06-08 MED ORDER — SODIUM CHLORIDE 0.9 % IV BOLUS
1000.0000 mL | Freq: Once | INTRAVENOUS | Status: AC
  Administered 2018-06-08: 1000 mL via INTRAVENOUS

## 2018-06-08 MED ORDER — IPRATROPIUM-ALBUTEROL 0.5-2.5 (3) MG/3ML IN SOLN
3.0000 mL | Freq: Four times a day (QID) | RESPIRATORY_TRACT | Status: DC
  Administered 2018-06-08 – 2018-06-12 (×14): 3 mL via RESPIRATORY_TRACT
  Filled 2018-06-08 (×16): qty 3

## 2018-06-08 MED ORDER — BUDESONIDE 0.5 MG/2ML IN SUSP
0.5000 mg | Freq: Two times a day (BID) | RESPIRATORY_TRACT | Status: DC
  Administered 2018-06-08 – 2018-06-12 (×8): 0.5 mg via RESPIRATORY_TRACT
  Filled 2018-06-08 (×8): qty 2

## 2018-06-08 MED ORDER — FENTANYL 2500MCG IN NS 250ML (10MCG/ML) PREMIX INFUSION: 25.0000 ug/h | INTRAVENOUS | Status: DC

## 2018-06-08 MED ORDER — SODIUM CHLORIDE 0.9 % IV SOLN
250.0000 mL | INTRAVENOUS | Status: DC | PRN
  Administered 2018-06-08 – 2018-06-09 (×2): 250 mL via INTRAVENOUS

## 2018-06-08 MED ORDER — ASPIRIN 300 MG RE SUPP: 300.0000 mg | RECTAL | Status: DC

## 2018-06-08 MED ORDER — SODIUM CHLORIDE 0.9 % IV SOLN
500.0000 mg | Freq: Once | INTRAVENOUS | Status: AC
  Administered 2018-06-08: 500 mg via INTRAVENOUS
  Filled 2018-06-08: qty 500

## 2018-06-08 MED ORDER — NOREPINEPHRINE 4 MG/250ML-% IV SOLN
0.0000 ug/min | INTRAVENOUS | Status: DC
  Filled 2018-06-08: qty 250

## 2018-06-08 MED ORDER — PROPOFOL 1000 MG/100ML IV EMUL
5.0000 ug/kg/min | Freq: Once | INTRAVENOUS | Status: AC
  Administered 2018-06-08: 5 ug/kg/min via INTRAVENOUS
  Filled 2018-06-08: qty 100

## 2018-06-08 MED ORDER — FAMOTIDINE IN NACL 20-0.9 MG/50ML-% IV SOLN
20.0000 mg | Freq: Two times a day (BID) | INTRAVENOUS | Status: DC
  Administered 2018-06-08 – 2018-06-11 (×6): 20 mg via INTRAVENOUS
  Filled 2018-06-08 (×6): qty 50

## 2018-06-08 MED ORDER — NOREPINEPHRINE BITARTRATE 1 MG/ML IV SOLN
0.0000 ug/min | INTRAVENOUS | Status: DC
  Administered 2018-06-08: 4 ug/min via INTRAVENOUS
  Filled 2018-06-08 (×2): qty 4

## 2018-06-08 MED ORDER — METHYLPREDNISOLONE SODIUM SUCC 125 MG IJ SOLR
60.0000 mg | Freq: Three times a day (TID) | INTRAMUSCULAR | Status: DC
  Administered 2018-06-08 – 2018-06-10 (×5): 60 mg via INTRAVENOUS
  Filled 2018-06-08 (×5): qty 2

## 2018-06-08 MED ORDER — SODIUM CHLORIDE 0.9 % IV SOLN
Freq: Once | INTRAVENOUS | Status: AC
  Administered 2018-06-08: 17:00:00 via INTRAVENOUS

## 2018-06-08 MED ORDER — PROPOFOL 1000 MG/100ML IV EMUL
5.0000 ug/kg/min | INTRAVENOUS | Status: DC
  Administered 2018-06-09: 5 ug/kg/min via INTRAVENOUS
  Filled 2018-06-08 (×2): qty 100

## 2018-06-08 MED ORDER — SODIUM CHLORIDE 0.9 % IV SOLN
INTRAVENOUS | Status: DC
  Administered 2018-06-08 – 2018-06-09 (×2): via INTRAVENOUS

## 2018-06-08 MED ORDER — STERILE WATER FOR INJECTION IJ SOLN
INTRAMUSCULAR | Status: AC
  Filled 2018-06-08: qty 10

## 2018-06-08 MED ORDER — SUCCINYLCHOLINE CHLORIDE 20 MG/ML IJ SOLN
70.0000 mg | Freq: Once | INTRAMUSCULAR | Status: AC
  Administered 2018-06-08: 70 mg via INTRAVENOUS

## 2018-06-08 NOTE — ED Notes (Signed)
Respiratory called to run VBG in minilab

## 2018-06-08 NOTE — Procedures (Signed)
Intubation Procedure Note Courtney Terry [id number] 1961-05-26  Procedure: Intubation Indications: Airway protection and maintenance  Procedure Details Consent: Risks of procedure as well as the alternatives and risks of each were explained to the (patient/caregiver).  Consent for procedure obtained. Time Out: Verified patient identification, verified procedure, site/side was marked, verified correct patient position, special equipment/implants available, medications/allergies/relevent history reviewed, required imaging and test results available.  Performed  Maximum sterile technique was used including gown.  MAC and 3    Evaluation Hemodynamic Status: BP stable throughout; O2 sats: stable throughout Patient's Current Condition: stable Complications: No apparent complications Patient did tolerate procedure well. Chest X-ray ordered to verify placement.  CXR: pending.   Elsie Stain 06/08/2018

## 2018-06-08 NOTE — Progress Notes (Signed)
[facility] Progress Note Patient Name: CARLIA BOMKAMP DOB: 1961/04/14 MRN: [id number]   Date of Service  06/08/2018  HPI/Events of Note  Lactic Acid = 2.03 -LVEF = 65% to 70% with PA systolic pressure = 48.  eICU Interventions  Will order: 1. Bolus with 0.9 NaCl 1 liter IV over 1 hour now.      Intervention Category Major Interventions: Acid-Base disturbance - evaluation and management  Dray Dente Eugene 06/08/2018, 9:06 PM

## 2018-06-08 NOTE — ED Provider Notes (Signed)
Called to bedside for profound hypotension in this intubated patient with ongoing sedation.  All sedation paused, given additional liter fluid bolus, norepinephrine requested.  Blood pressure improved from 24E to 50 systolic to 80 systolic with a liter bolus, admitting team made aware, transport pending.  Critical Care Documentation Critical care time provided by me (excluding procedures): 31 minutes  Condition necessitating critical care: profound hypotension  Components of critical care management: reviewing of prior records, laboratory and imaging interpretation, frequent re-examination and reassessment of vital signs, administration of IV fluid resuscitation, pausing all sedation, consideration of pressor support.     Maudie Flakes, MD 06/08/18 [place]

## 2018-06-08 NOTE — ED Notes (Signed)
Bed: WA11 Expected date:  Expected time:  Means of arrival:  Comments: EMS-SOB 
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
## 2018-06-08 NOTE — ED Triage Notes (Signed)
Per EMS: Pt found byr roommate.  Pt lying in floor, semi-conscious.  Neb tx given.  Pt conscious but not A&O after ned.  Pt did not want to go to hospital but pt was not alert and oriented.  Initial O2 sat was less than 50%.  Pt  Is normally on 3 L, but tubing was twisted on .  Pt's roommate said she was not able to get her prescribed inhaler due to finances.  Hx of COPD.

## 2018-06-08 NOTE — Procedures (Signed)
Name:  Courtney Terry MRN:  [id number] DOB:  March 09, 1961  PROCEDURE NOTE  Procedure:  Central venous catheter placement.  Indications:  Need for intravenous access and hemodynamic monitoring.IV access   Consent:  Consent was implied due to the emergency nature of the procedure.  Anesthesia:  A total of 10 mL of 1% Lidocaine was used for local infiltration anesthesia.  Procedure summary:  Appropriate equipment was assembled.  The patient was identified as Courtney Terry and safety timeout was performed. The patient was placed in Trendelenburg position.  Sterile technique was used. The patient's left anterior chest wall was prepped using chlorhexidine / alcohol scrub and the field was draped in usual sterile fashion with full body drape. After the adequate anesthesia was achieved, the Left IJ vein was cannulated with the introducer needle without difficulty. A guide wire was advanced through the introducer needle, which was then withdrawn. A small skin incision was made at the point of wire entry, the dilator was inserted over the guide wire and appropriate dilation was obtained. The dilator was removed and  triple-lumen catheter was advanced over the guide wire, which was then removed.  All ports were aspirated and flushed with normal saline without difficulty. The catheter was secured into place at 19 cm. Antibiotic patch was placed and sterile dressing was applied. Post-procedure chest x-ray was ordered.  Complications:  No immediate complications were noted.  Hemodynamic parameters and oxygenation remained stable throughout the procedure.  Estimated blood loss:  Less then 5 mL.  Etheleen Nicks, MD Pulmonary and [place] Cell: [phone]  06/08/2018, 9:11 PM

## 2018-06-08 NOTE — H&P (Addendum)
PULMONARY / CRITICAL CARE MEDICINE   Name: Courtney Terry MRN: [id number] DOB: Aug 02, 1961    ADMISSION DATE:  06/08/2018 CONSULTATION DATE: June 08, 2018 REFERRING MD: ED staff at [facility]: Shortness of breath altered mental status  HISTORY OF PRESENT ILLNESS:   This patient is 57, thin female past medical history of COPD cannot abuse and emphysema COPD chronic respiratory failure using oxygen was found by her roommate before gasping for air the oxygen tubing was twisted and patient was not receiving her oxygen prescription if there is no attempt to report any history to get symptoms with any family members but stopped to get to her office supposed to saturation was in the 50s and patient was struggling so she was intubated for bed ABG and severe respiratory distress.  I am not able to get any history from the patient.  H&P provided by the ED staff earlier in the course. HPI Patient was found by roommate on the floor in her apartment.  Reportedly the oxygen tubing was twisted and patient likely was not getting any of her oxygen.  Patient was unresponsive.  EMS called.  No family members or bystanders have arrived to give ancillary history.  EMS report indicates that the patient was semi-conscious.  Her initial oxygen saturation less than 50%.  Patient's oxygen saturation did respond to supplemental oxygen by EMS.  On arrival, patient is somnolent but will awaken to light moderate stimulus and answer simple questions.  She denies chest pain but does endorse cough and shortness of breath.  Patient was able to identify what year it is after some amount of time and  with one incorrect answer.  Correctly identified her name and address for nursing staff.  Patient did endorse drug use, she stated she had used crack although it is unclear to me with her level of confusion if this is accurate.  PAST MEDICAL HISTORY :  She  has a past medical history of Anemia, Anxiety,  Arthritis, Asthma, Bipolar 1 disorder ([place]), Breast cancer ([place]) (01/2012), Cancer of right breast ([place]) (03/2015), Cocaine abuse ([place]), COPD (chronic obstructive pulmonary disease) ([place]), Depression, Hallucination, Hypertension, Hypothyroidism, Nocturia, PTSD (post-traumatic stress disorder), Schizophrenia ([place]), Seizures ([place]), and Shortness of breath dyspnea.  PAST SURGICAL HISTORY: She  has a past surgical history that includes Patella fracture surgery (Right, 1993); Tonsillectomy; Ovarian cyst surgery; Breast lumpectomy with needle localization and axillary sentinel lymph node bx (03/06/2012); Mastectomy complete / simple (Right, 03/14/2015); Nipple sparing mastectomy (Right, 03/14/2015); Breast reconstruction with placement of tissue expander and flex hd (acellular hydrated dermis) (Right, 03/14/2015); Removal of tissue expander and placement of implant (Right, 06/06/2015); Breast implant removal (Right, 06/19/2015); Breast implant removal (Left, 06/20/2015); Breast biopsy (Right, 02/2012); Breast lumpectomy (Right, 02/2012); Breast biopsy (Right, 02/2015); Placement of breast implants (Bilateral, 10/20/2015); Breast implant removal (Right, 11/07/2015); Latissimus flap to breast (Right, 03/08/2016); Tissue expander placement (Right, 03/08/2016); Tissue expander  removal w/ replacement of implant (Right, 01/24/2017); Fracture surgery; Placement of breast implants (Bilateral); Inguinal hernia repair (Left); Removal of bilateral tissue expanders with placement of bilateral breast implants (Right, 01/24/2017); Placement of breast implants (Left, 09/19/2017); and Placement of breast implants (Left, 09/19/2017).  Allergies  Allergen Reactions  . Levaquin [Levofloxacin] Hives    No current facility-administered medications on file prior to encounter.    Current Outpatient Medications on File Prior to Encounter  Medication Sig  . acetaZOLAMIDE (DIAMOX) 250 MG tablet Take 1 tablet (250 mg total) by mouth daily.  [name] Kitchen  albuterol  (PROAIR HFA) 108 (90 Base) MCG/ACT inhaler Inhale 2 puffs into the lungs every 4 (four) hours as needed for wheezing or shortness of breath.  [name] Kitchen albuterol (PROVENTIL) (2.5 MG/3ML) 0.083% nebulizer solution TAKE 3 MLS BY NEBULIZATION EVERY 4 HOURS AS NEEDED FOR WHEEZING OR SHORTNESS OF BREATH  . amLODipine (NORVASC) 5 MG tablet Take 1 tablet (5 mg total) by mouth daily.  [name] Kitchen anastrozole (ARIMIDEX) 1 MG tablet TAKE 1 TABLET (1 MG TOTAL) BY MOUTH DAILY.  . diphenhydrAMINE (BENADRYL) 50 MG capsule Take 50 mg by mouth at bedtime.  . divalproex (DEPAKOTE) 500 MG DR tablet Take 500 mg by mouth 2 (two) times daily.   . famotidine (PEPCID) 20 MG tablet One at bedtime (Patient taking differently: Take 20 mg by mouth at bedtime. )  . FLUoxetine (PROZAC) 40 MG capsule Take 40 mg by mouth daily.   . furosemide (LASIX) 40 MG tablet Take 1 tablet (40 mg total) by mouth daily.  . furosemide (LASIX) 40 MG tablet Take 1 tablet (40 mg total) by mouth every other day.  . Glycopyrrolate-Formoterol (BEVESPI AEROSPHERE) 9-4.8 MCG/ACT AERO Inhale 2 puffs into the lungs 2 (two) times daily.  [name] Kitchen levothyroxine (SYNTHROID, LEVOTHROID) 75 MCG tablet Take 1 tablet (75 mcg total) daily by mouth.  . OLANZapine (ZYPREXA) 20 MG tablet Take 20 mg by mouth at bedtime.   [name] Kitchen omeprazole (PRILOSEC) 40 MG capsule Take 30-60 min before first meal of the day (Patient taking differently: Take 40 mg by mouth daily. Take 30-60 min before first meal of the day)  . OXYGEN Inhale 2 L into the lungs as needed.   [name] Kitchen PROAIR HFA 108 (90 Base) MCG/ACT inhaler INHALE TWO PUFFS INTO THE LUNGS EVERY 4 (FOUR) HOURS AS NEEDED FOR WHEEZING OR SHORTNESS OF BREATH.    FAMILY HISTORY:  Her She indicated that her mother is deceased. She indicated that her father is deceased. She indicated that only one of her two sisters is alive. She indicated that only one of her two brothers is alive. She indicated that her other is alive.   SOCIAL HISTORY: She  reports that  she has been smoking cigarettes. She has a 27.75 pack-year smoking history. She has never used smokeless tobacco. She reports that she has current or past drug history. Drugs: "Crack" cocaine and Cocaine. She reports that she does not drink alcohol.  REVIEW OF SYSTEMS:   Unable to obtain due to patient's conditions severe status due to sedation and intubation  SUBJECTIVE:  Intubated sedated  VITAL SIGNS: BP (!) 86/73   Pulse (!) 113   Temp 98.4 F (36.9 C)   Resp 18   Ht 5\' 10"  (1.778 m)   Wt 45.4 kg   SpO2 100%   BMI 14.35 kg/m   HEMODYNAMICS:    VENTILATOR SETTINGS: Vent Mode: PRVC FiO2 (%):  [40 %] 40 % Set Rate:  [18 bmp] 18 bmp Vt Set:  [550 mL] 550 mL PEEP:  [5 cmH20] 5 cmH20 Plateau Pressure:  [40 cmH20] 40 cmH20  INTAKE / OUTPUT: No intake/output data recorded.  PHYSICAL EXAMINATION: General: Intubated sedated to provide yes or no questions Neuro: Moving upper lower extremities without limitation and he was a sedated HEENT:  atraumatic , no jaundice , dry mucous membranes  Cardiovascular:  Irregular irregular , ESM 2/6 in the aortic area  Lungs: Bilateral respiratory wheezing no use of accessory muscles. Abdomen:  Soft lax +BS , no tenderness . Musculoskeletal:  WNL , normal pulses  Skin:  No rash    LABS:  BMET Recent Labs  Lab 06/08/18 1413  NA 143  K 3.9  CL 90*  CO2 46*  BUN 16  CREATININE 0.44  GLUCOSE 133*    Electrolytes Recent Labs  Lab 06/08/18 1413  CALCIUM 9.2    CBC Recent Labs  Lab 06/08/18 1413  WBC 7.5  HGB 13.0  HCT 46.3*  PLT 187    Coag's No results for input(s): APTT, INR in the last 168 hours.  Sepsis Markers Recent Labs  Lab 06/08/18 1433 06/08/18 1643  LATICACIDVEN 1.16 2.03*    ABG No results for input(s): PHART, PCO2ART, PO2ART in the last 168 hours.  Liver Enzymes Recent Labs  Lab 06/08/18 1413  AST 20  ALT 16  ALKPHOS 63  BILITOT 0.3  ALBUMIN 3.3*    Cardiac Enzymes No results for  input(s): TROPONINI, PROBNP in the last 168 hours.  Glucose Recent Labs  Lab 06/08/18 1324  GLUCAP 164*    Imaging Dg Chest 2 View  Result Date: 06/08/2018 CLINICAL DATA:  Hypoxia. EXAM: CHEST - 2 VIEW COMPARISON:  Chest x-ray dated March 16, 2018. FINDINGS: The heart size and mediastinal contours are within normal limits. Normal pulmonary vascularity. Unchanged severe hyperinflation No focal consolidation, pleural effusion, or pneumothorax. No acute osseous abnormality. IMPRESSION: COPD.  No active cardiopulmonary disease. Electronically Signed   By: Titus Dubin M.D.   On: 06/08/2018 14:19     This is a 57 year old female past medical history of severe chronic respiratory failure COPD emphysema considered cocaine abuse presented with close shortness of breath hypoxic respiratory failure severe COPD exacerbation.  Mental status started improving after the patient was put on the ventilator machine.  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  Standard treatment for COPD exacerbation steroids duo nebs Pulmicort support. --We will start the patient sedation with propofol and fentanyl. --Abuse.  Patient endorsed propofol with propofol. --Patient will be seen in DVT GI prophylaxis. --Maintenance IV fluids.   --Need to be counseled for her COPD when cocaine when she is more aware of surroundings.   Pulmonary and [place] Pager: [phone]  06/08/2018, 5:17 PM

## 2018-06-08 NOTE — ED Notes (Signed)
Intensivist paged. He states he will order levophed and to get pt to ICU and he will start a central line.

## 2018-06-08 NOTE — ED Notes (Signed)
Propofol pause d/t pressure drop. MD aware. Will continue to monitor.

## 2018-06-08 NOTE — ED Notes (Signed)
Pt sitting up in bed, trying to get up

## 2018-06-09 ENCOUNTER — Inpatient Hospital Stay (HOSPITAL_COMMUNITY): Payer: Medicaid Other

## 2018-06-09 ENCOUNTER — Other Ambulatory Visit: Payer: Self-pay

## 2018-06-09 DIAGNOSIS — L8990 Pressure ulcer of unspecified site, unspecified stage: Secondary | ICD-10-CM

## 2018-06-09 LAB — CBC
HCT: 45.1 % (ref 36.0–46.0)
Hemoglobin: 13 g/dL (ref 12.0–15.0)
MCH: 26.4 pg (ref 26.0–34.0)
MCHC: 28.8 g/dL — AB (ref 30.0–36.0)
MCV: 91.5 fL (ref 78.0–100.0)
Platelets: 184 10*3/uL (ref 150–400)
RBC: 4.93 MIL/uL (ref 3.87–5.11)
RDW: 15.9 % — ABNORMAL HIGH (ref 11.5–15.5)
WBC: 6.5 10*3/uL (ref 4.0–10.5)

## 2018-06-09 LAB — GLUCOSE, CAPILLARY
GLUCOSE-CAPILLARY: 136 mg/dL — AB (ref 70–99)
GLUCOSE-CAPILLARY: 104 mg/dL — AB (ref 70–99)
Glucose-Capillary: 105 mg/dL — ABNORMAL HIGH (ref 70–99)
Glucose-Capillary: 106 mg/dL — ABNORMAL HIGH (ref 70–99)
Glucose-Capillary: 113 mg/dL — ABNORMAL HIGH (ref 70–99)
Glucose-Capillary: 93 mg/dL (ref 70–99)
Glucose-Capillary: 98 mg/dL (ref 70–99)

## 2018-06-09 LAB — BLOOD GAS, ARTERIAL
Acid-Base Excess: 6.2 mmol/L — ABNORMAL HIGH (ref 0.0–2.0)
Bicarbonate: 30 mmol/L — ABNORMAL HIGH (ref 20.0–28.0)
Drawn by: 11249
FIO2: 40
MECHVT: 550 mL
O2 Saturation: 98.9 %
PEEP: 5 cmH2O
PO2 ART: 141 mmHg — AB (ref 83.0–108.0)
Patient temperature: 100.2
RATE: 18 resp/min
pCO2 arterial: 43 mmHg (ref 32.0–48.0)
pH, Arterial: 7.462 — ABNORMAL HIGH (ref 7.350–7.450)

## 2018-06-09 LAB — BASIC METABOLIC PANEL
Anion gap: 14 (ref 5–15)
BUN: 21 mg/dL — AB (ref 6–20)
CO2: 29 mmol/L (ref 22–32)
CREATININE: 0.57 mg/dL (ref 0.44–1.00)
Calcium: 8.8 mg/dL — ABNORMAL LOW (ref 8.9–10.3)
Chloride: 101 mmol/L (ref 98–111)
GFR calc Af Amer: >60 mL/min (ref >60)
GFR calc non Af Amer: >60 mL/min (ref >60)
Glucose, Bld: 122 mg/dL — ABNORMAL HIGH (ref 70–99)
Potassium: 4.6 mmol/L (ref 3.5–5.1)
SODIUM: 144 mmol/L (ref 135–145)

## 2018-06-09 LAB — PHOSPHORUS: PHOSPHORUS: 1.8 mg/dL — AB (ref 2.5–4.6)

## 2018-06-09 LAB — MAGNESIUM: Magnesium: 1.6 mg/dL — ABNORMAL LOW (ref 1.7–2.4)

## 2018-06-09 LAB — MRSA PCR SCREENING: MRSA by PCR: NEGATIVE

## 2018-06-09 MED ORDER — CHLORHEXIDINE GLUCONATE 0.12% ORAL RINSE (MEDLINE KIT)
15.0000 mL | Freq: Two times a day (BID) | OROMUCOSAL | Status: DC
  Administered 2018-06-09 (×2): 15 mL via OROMUCOSAL

## 2018-06-09 MED ORDER — MAGNESIUM SULFATE 2 GM/50ML IV SOLN
2.0000 g | Freq: Once | INTRAVENOUS | Status: AC
  Administered 2018-06-09: 2 g via INTRAVENOUS
  Filled 2018-06-09: qty 50

## 2018-06-09 MED ORDER — OLANZAPINE 5 MG PO TBDP
20.0000 mg | ORAL_TABLET | Freq: Every day | ORAL | Status: DC
  Administered 2018-06-09 – 2018-06-11 (×3): 20 mg via ORAL
  Filled 2018-06-09 (×4): qty 4

## 2018-06-09 MED ORDER — SODIUM PHOSPHATES 45 MMOLE/15ML IV SOLN
30.0000 mmol | Freq: Once | INTRAVENOUS | Status: AC
  Administered 2018-06-09: 30 mmol via INTRAVENOUS
  Filled 2018-06-09: qty 10

## 2018-06-09 MED ORDER — ORAL CARE MOUTH RINSE
15.0000 mL | OROMUCOSAL | Status: DC
  Administered 2018-06-09 – 2018-06-10 (×6): 15 mL via OROMUCOSAL

## 2018-06-09 MED ORDER — HEPARIN SODIUM (PORCINE) 5000 UNIT/ML IJ SOLN
5000.0000 [IU] | Freq: Three times a day (TID) | INTRAMUSCULAR | Status: DC
  Administered 2018-06-09 – 2018-06-12 (×10): 5000 [IU] via SUBCUTANEOUS
  Filled 2018-06-09 (×10): qty 1

## 2018-06-09 MED ORDER — LEVOTHYROXINE SODIUM 75 MCG PO TABS
75.0000 ug | ORAL_TABLET | Freq: Every day | ORAL | Status: DC
  Administered 2018-06-11 – 2018-06-12 (×2): 75 ug via ORAL
  Filled 2018-06-09 (×3): qty 1

## 2018-06-09 MED ORDER — FLUOXETINE HCL 20 MG PO CAPS
40.0000 mg | ORAL_CAPSULE | Freq: Every day | ORAL | Status: DC
  Administered 2018-06-09: 40 mg via ORAL
  Filled 2018-06-09: qty 2

## 2018-06-09 MED ORDER — ARFORMOTEROL TARTRATE 15 MCG/2ML IN NEBU
15.0000 ug | INHALATION_SOLUTION | Freq: Two times a day (BID) | RESPIRATORY_TRACT | Status: DC
  Administered 2018-06-09 – 2018-06-12 (×7): 15 ug via RESPIRATORY_TRACT
  Filled 2018-06-09 (×6): qty 2

## 2018-06-09 NOTE — Care Management Note (Signed)
Case Management Note  Patient Details  Name: Courtney Terry MRN: [id number] Date of Birth: [birth year]  Subjective/Objective:                   65, thin female past medical history of COPD cannot abuse and emphysema COPD chronic respiratory failure using oxygen was found by her roommate before gasping for air the oxygen tubing was twisted and patient was not receiving her oxygen prescription if there is no attempt to report any history to get symptoms with any family members but stopped to get to her office supposed to saturation was in the 40s and patient was struggling so she was intubated for bed ABG and severe respiratory distress  Action/Plan: Discharge Criteria Return to top of Respiratory Failure GRG - GRG [Expand All / Collapse All]  Continued inpatient stay is needed until 1 or more of the following are present: ? Acceptable patient status for next level of care is achieved. ? ALL of the following are present:  Weaning assessment performed   Weaning trials attempted not at this time    Ventilation status appropriate-yes    Airway status acceptable intubated    Respiratory status acceptable per vent settings    Stable chest findings yes   No chest tube, or status acceptable-no    Neurologic status acceptable sedated    Temperature status acceptable yes    No infection, or status acceptable yes    Activity level acceptable cbr    Intake acceptable npo     Expected Discharge Date:                  Expected Discharge Plan:  Home/Self Care  In-House Referral:     Discharge planning Services  CM Consult  Post Acute Care Choice:    Choice offered to:     DME Arranged:    DME Agency:     HH Arranged:    HH Agency:     Status of Service:  In process, will continue to follow  If discussed at Long Length of Stay Meetings, dates discussed:    Additional Comments:  Leeroy Cha, RN 06/09/2018, 9:16 AM

## 2018-06-09 NOTE — Progress Notes (Signed)
Initial Nutrition Assessment  DOCUMENTATION CODES:   Severe malnutrition in context of social or environmental circumstances  INTERVENTION:   If unable to extubated in 24-48 hrs Vital AF 1.2 @ 40 ml/hr Provides: 1152 kcals, 72 grams protein, 778 ml free water.   Once diet advanced Ensure Enlive po TID, each supplement provides 350 kcal and 20 grams of protein  NUTRITION DIAGNOSIS:   Severe Malnutrition related to social / environmental circumstances(polysubstance abuse) as evidenced by percent weight loss, energy intake < or equal to 50% for > or equal to 1 month, moderate fat depletion, moderate muscle depletion, severe muscle depletion.  GOAL:   Patient will meet greater than or equal to 90% of their needs  MONITOR:   Weight trends, Labs, Diet advancement, Vent status  REASON FOR ASSESSMENT:   Ventilator    ASSESSMENT:   Patient with PMH significant for schizophrenia, stage I breast cancer ( s/p lumpectomy 2013), hypertension, COPD, polysubstance abuse, seizure disorder, and chronic respiratory failure on 4 L/min at baseline. Presents this admission in acute respiratory distress requiring intubation.    Husband at bedside reports pt had an ongoing poor appetite for a year PTA. States she typically eats one meal daily that consist of breakfast foods (oatmeal, eggs, peanut butter and jelly sandwich). In the past she would drink Ensure but hasn't in years. Her UBW reported to be 150 lb. Records indicate pt weighed 135 lb on 09/09/18 and 102 lb this admission (24.4% wt loss in 9 months, significant for time frame). Of note pt claims to have used crack cocaine the day of admission. Suspect poor intake could be related to this as well.   Per RN, plan to extubated today. Will provide Ensure once diet is advanced.   Patient is currently intubated on ventilator support MV: 10.0 L/min Temp (24hrs), Avg:99.5 F (37.5 C), Min:98.2 F (36.8 C), Max:100.4 F (38 C) Propofol: 1.3  ml/hr (provides 35 kcal)  I/O: +1.5 L since admit UOP: 150 ml x 24 hrs  Medications reviewed and include: solumedrol, propofol Labs reviewed: Phosphorus 1.8 (L) Mg 1.6 (L)  NUTRITION - FOCUSED PHYSICAL EXAM:    Most Recent Value  Orbital Region  Moderate depletion  Upper Arm Region  Moderate depletion  Thoracic and Lumbar Region  Unable to assess  Buccal Region  Unable to assess  Temple Region  Moderate depletion  Clavicle Bone Region  Severe depletion  Clavicle and Acromion Bone Region  Severe depletion  Scapular Bone Region  Unable to assess  Dorsal Hand  Severe depletion  Patellar Region  Moderate depletion  Anterior Thigh Region  Moderate depletion  Posterior Calf Region  Moderate depletion  Edema (RD Assessment)  None     Diet Order:   Diet Order            Diet NPO time specified  Diet effective now              EDUCATION NEEDS:   Not appropriate for education at this time  Skin:  Skin Assessment: Skin Integrity Issues: Skin Integrity Issues:: Stage II Stage II: sacrum  Last BM:  PTA  Height:   Ht Readings from Last 1 Encounters:  06/08/18 5\' 2"  (1.575 m)    Weight:   Wt Readings from Last 1 Encounters:  06/09/18 46.3 kg    Ideal Body Weight:  50 kg  BMI:  Body mass index is 18.67 kg/m.  Estimated Nutritional Needs:   Kcal:  1176 kcal  Protein:  60-75  grams  Fluid:  >/= 1.5 L/day  Mariana Single RD, LDN Clinical Nutrition Pager # [phone]

## 2018-06-09 NOTE — Progress Notes (Addendum)
Courtney Terry  QIH:[id number] DOB: 10-26-1960 DOA: 06/08/2018 PCP: [name], MD    LOS: 1 day   Reason for Consult / Chief Complaint:  Shortness of Breath   Consulting MD and Date:  9/2, Dr. Sedonia Small  HPI / Summary of Hospital Stay:  57 y/o F admitted 9/2 with reports of shortness of breath.  The patient has a known history of emphysema, COPD requiring O2.  She was found by her roommate gasping for air and it was noted that O2 tubing was twisted pt was reportedly not getting any oxygen.  She was found to have sats in the 50's and brought to the ER.  EMS initial exam found her semi-conscious.  She was somnolent on arrival to the ER but would awaken and answer simple questions.  Admitted to crack cocaine but unclear if this is accurate due to encephalopathy/hypoxia.  She was intubated in the ER for hypoxic / hypercarbic respiratory failure.  Mental status improved post intubation. Admitted to ICU.  CXR negative for acute process.    Subjective:  RN reports pt failed weaning due to low rate / volumes.  No acute events.  Tmax 100.4.    Objective   Blood pressure 103/69, pulse 90, temperature 99.1 F (37.3 C), resp. rate 18, height 5\' 2"  (1.575 m), weight 46.3 kg, SpO2 100 %.    Vent Mode: PRVC FiO2 (%):  [30 %-40 %] 30 % Set Rate:  [18 bmp] 18 bmp Vt Set:  [550 mL] 550 mL PEEP:  [5 cmH20] 5 cmH20 Pressure Support:  [5 cmH20] 5 cmH20 Plateau Pressure:  [24 cmH20-40 cmH20] 27 cmH20   Intake/Output Summary (Last 24 hours) at 06/09/2018 1207 Last data filed at 06/09/2018 1116 Gross per 24 hour  Intake 1534.35 ml  Output 150 ml  Net 1384.35 ml   Filed Weights   06/08/18 1335 06/08/18 1845 06/09/18 0239  Weight: 45.4 kg 46.5 kg 46.3 kg    Examination: General: thin adult female in NAD on vent    HEENT: MM pink/moist, ETT Neuro: Awakens to voice, nods / moves all extremities  CV: s1s2 rrr, no m/r/g PULM: even/non-labored, lungs bilaterally diminished, no significant  wheeze OV:FIEP, non-tender, bsx4 active  Extremities: warm/dry, no edema  Skin: no rashes or lesions  Consults: date of consult/date signed off & final recs:    Procedures: ETT 9/2 >>   Significant Diagnostic Tests: UDS 9/2 >> cocaine positive   Micro Data:   Antimicrobials:  Azithromycin 9/2 >>   Resolved Hospital Problem list     Assessment & Plan:   Acute on Chronic Hypoxemic / Hypercarbic Respiratory Failure - multifactorial in the setting of substance abuse, lack of functioning O2 P: PRVC 8 cc/kg  SBT wean as tolerated  Follow intermittent CXR Wean O2 as needed for sats 88-95%  O2 Dependent COPD with Acute Exacerbation  P: Pulmicort + Brovana BID  Duoneb Q6 Solumedrol 60 mg IV Q6  Empiric Azithromycin  Pulmonary hygiene once extubated  Polysubstance Abuse  P: Smoking / cocaine cessation counseling once able   Anxiety  Schizophrenia  Bipolar Disorder  P: Lighten sedation, hopeful for weaning  She may need light sedation for weaning due to anxiety  Continue zyprexa, proxac Hold depakote > apparently has not filled since 01/2018  Hypothyroidism  P: Continue synthroid   Hypertension  P: Hold home norvasc, acetazolamide   Disposition / Summary of Today's Plan 06/09/18   Continue vent support.  Lighten sedation / attempt  to wean again this afternoon.    Best Practice / Goals of Care / Disposition.   DVT prophylaxis: SCD's, heparin  GI prophylaxis: Pepcid  Diet: NPO  Mobility:Bedrest  Code Status: Full code  Family Communication: Significant other asleep in chair at bedside.    Labs   CBC: Recent Labs  Lab 06/08/18 1413 06/09/18 0350  WBC 7.5 6.5  NEUTROABS 6.3  --   HGB 13.0 13.0  HCT 46.3* 45.1  MCV 95.3 91.5  PLT 187 400   Basic Metabolic Panel: Recent Labs  Lab 06/08/18 1413 06/09/18 0350  NA 143 144  K 3.9 4.6  CL 90* 101  CO2 46* 29  GLUCOSE 133* 122*  BUN 16 21*  CREATININE 0.44 0.57  CALCIUM 9.2 8.8*  MG  --  1.6*   PHOS  --  1.8*   GFR: Estimated Creatinine Clearance: 57.4 mL/min (by C-G formula based on SCr of 0.57 mg/dL). Recent Labs  Lab 06/08/18 1413 06/08/18 1433 06/08/18 1643 06/09/18 0350  WBC 7.5  --   --  6.5  LATICACIDVEN  --  1.16 2.03*  --    Liver Function Tests: Recent Labs  Lab 06/08/18 1413  AST 20  ALT 16  ALKPHOS 63  BILITOT 0.3  PROT 6.7  ALBUMIN 3.3*   No results for input(s): LIPASE, AMYLASE in the last 168 hours. Recent Labs  Lab 06/08/18 1413  AMMONIA 50*   ABG    Component Value Date/Time   PHART 7.462 (H) 06/09/2018 0436   PCO2ART 43.0 06/09/2018 0436   PO2ART 141 (H) 06/09/2018 0436   HCO3 30.0 (H) 06/09/2018 0436   TCO2 46 (H) 01/26/2018 1026   ACIDBASEDEF 0.4 06/30/2014 1033   O2SAT 98.9 06/09/2018 0436    Coagulation Profile: No results for input(s): INR, PROTIME in the last 168 hours.   Cardiac Enzymes: No results for input(s): CKTOTAL, CKMB, CKMBINDEX, TROPONINI in the last 168 hours.   HbA1C: Hgb A1c MFr Bld  Date/Time Value Ref Range Status  02/26/2016 04:15 PM 6.2 (H) 4.8 - 5.6 % Final    Comment:    (NOTE)         Pre-diabetes: 5.7 - 6.4         Diabetes: >6.4         Glycemic control for adults with diabetes: <7.0    CBG: Recent Labs  Lab 06/08/18 2048 06/09/18 0002 06/09/18 0350 06/09/18 0737 06/09/18 1150  GLUCAP 91 105* 113* 98 [place], NP-C [facility] Pgr: [phone] or if no answer [phone] 06/09/2018, 12:07 PM

## 2018-06-09 NOTE — Progress Notes (Signed)
[facility] Progress Note Patient Name: Courtney Terry DOB: [birth year] MRN: [id number]   Date of Service  06/09/2018  HPI/Events of Note  Hypomagnesemia/Hypophosphatemia - Mg++ = 1.6 and PO4--- = 1.8.  eICU Interventions  Will replace both Mg++ and PO4---.     Intervention Category Major Interventions: Electrolyte abnormality - evaluation and management  Sommer,Steven Eugene 06/09/2018, 6:36 AM

## 2018-06-10 LAB — GLUCOSE, CAPILLARY
GLUCOSE-CAPILLARY: 89 mg/dL (ref 70–99)
GLUCOSE-CAPILLARY: 93 mg/dL (ref 70–99)
Glucose-Capillary: 105 mg/dL — ABNORMAL HIGH (ref 70–99)
Glucose-Capillary: 89 mg/dL (ref 70–99)
Glucose-Capillary: 116 mg/dL — ABNORMAL HIGH (ref 70–99)
Glucose-Capillary: 89 mg/dL (ref 70–99)

## 2018-06-10 LAB — BASIC METABOLIC PANEL
Anion gap: 8 (ref 5–15)
BUN: 25 mg/dL — ABNORMAL HIGH (ref 6–20)
CO2: 30 mmol/L (ref 22–32)
Calcium: 9.1 mg/dL (ref 8.9–10.3)
Chloride: 105 mmol/L (ref 98–111)
Creatinine, Ser: 0.5 mg/dL (ref 0.44–1.00)
GFR calc Af Amer: >60 mL/min (ref >60)
GLUCOSE: 100 mg/dL — AB (ref 70–99)
Potassium: 4.8 mmol/L (ref 3.5–5.1)
Sodium: 143 mmol/L (ref 135–145)

## 2018-06-10 LAB — CBC
HCT: 35.1 % — ABNORMAL LOW (ref 36.0–46.0)
HEMOGLOBIN: 11.1 g/dL — AB (ref 12.0–15.0)
MCH: 26.8 pg (ref 26.0–34.0)
MCHC: 31.6 g/dL (ref 30.0–36.0)
MCV: 84.8 fL (ref 78.0–100.0)
PLATELETS: 166 10*3/uL (ref 150–400)
RBC: 4.14 MIL/uL (ref 3.87–5.11)
RDW: 16.2 % — ABNORMAL HIGH (ref 11.5–15.5)
WBC: 5.7 10*3/uL (ref 4.0–10.5)

## 2018-06-10 MED ORDER — FLUOXETINE HCL 20 MG/5ML PO SOLN
40.0000 mg | Freq: Every day | ORAL | Status: DC
  Filled 2018-06-10: qty 10

## 2018-06-10 MED ORDER — FLUOXETINE HCL 20 MG PO CAPS
40.0000 mg | ORAL_CAPSULE | Freq: Every day | ORAL | Status: DC
  Administered 2018-06-10 – 2018-06-12 (×3): 40 mg via ORAL
  Filled 2018-06-10 (×3): qty 2

## 2018-06-10 MED ORDER — METHYLPREDNISOLONE SODIUM SUCC 125 MG IJ SOLR
60.0000 mg | Freq: Two times a day (BID) | INTRAMUSCULAR | Status: DC
  Administered 2018-06-10: 60 mg via INTRAVENOUS
  Filled 2018-06-10 (×2): qty 2

## 2018-06-10 MED ORDER — ORAL CARE MOUTH RINSE
15.0000 mL | Freq: Two times a day (BID) | OROMUCOSAL | Status: DC
  Administered 2018-06-11: 15 mL via OROMUCOSAL

## 2018-06-10 NOTE — Care Management (Signed)
  Discharge Readiness Milestones  (1st Day of chart review)  MCG Used: respiratory failure Optimal GLOS: tbd Hospital Day: 2  Discharge Criteria Return to top of Respiratory Failure GRG - GRG [Expand All / Collapse All]  Continued inpatient stay is needed until 1 or more of the following are present: ? Acceptable patient status for next level of care is achieved. ? ALL of the following are present:  Weaning assessment performed   Weaning trials attempted not at this time    Ventilation status appropriate=yes   Airway status acceptable   intubated and stable=   Respiratory status acceptable  stable    Stable chest findings yes    No chest tube, or status acceptable none    Neurologic status acceptable sedated    Temperature status acceptable wnl    No infection, or status acceptable   wnl    Activity level acceptable sedated    Intake acceptable npo    No inpatient interventions needed not at this time    General Discharge Criteria met not at this time  Discharge Readiness Milestones  Identified Barriers: none Date/Name case discussed with attending MD (if applicable):  Date/name case discussed with supervisor (if applicable):  Date/name case referred to physician advisor (if applicable)   Concurrent Review - Discharge Readiness Milestones, Not Met see above

## 2018-06-10 NOTE — Progress Notes (Signed)
190 ml of fentanyl infusion bag wasted. Witnessed by Nanci Pina, RN.

## 2018-06-10 NOTE — Progress Notes (Addendum)
Courtney Terry  BDZ:[id number] DOB: 07-02-1961 DOA: 06/08/2018 PCP: [name], MD    LOS: 2 days   Reason for Consult / Chief Complaint:  Shortness of Breath   Consulting MD and Date:  9/2, Dr. Sedonia Small  HPI / Summary of Hospital Stay:  57 y/o F w/known history of emphysema, COPD requiring O2.  Admitted to crack cocaine but unclear if this is accurate due to encephalopathy/hypoxia. Reportedly oxygen was not hooked up on EMS arrival 9/2:  Intubated in the ER for hypoxic / hypercarbic respiratory failure.  Mental status improved post intubation. Admitted to ICU.  CXR negative for acute process.  Treated for AECOPD 9/3: failed weaning attempt. Max temp 100.4, trying to cut back on sedation  9/4: more awake. Excellent Vt on PSV of 5. Appears ready for extubation   Subjective:  No chest pain or shortness of breath. Tells me she wants to come off vent  Objective   Blood pressure 128/85, pulse 71, temperature 99.1 F (37.3 C), resp. rate 18, height 5\' 2"  (1.575 m), weight 52.2 kg, SpO2 100 %.    Vent Mode: PRVC FiO2 (%):  [30 %] 30 % Set Rate:  [18 bmp] 18 bmp Vt Set:  [550 mL] 550 mL PEEP:  [5 cmH20] 5 cmH20 Pressure Support:  [5 cmH20] 5 cmH20 Plateau Pressure:  [19 cmH20-24 cmH20] 22 cmH20   Intake/Output Summary (Last 24 hours) at 06/10/2018 0857 Last data filed at 06/10/2018 0600 Gross per 24 hour  Intake 2348.43 ml  Output 575 ml  Net 1773.43 ml   Filed Weights   06/08/18 1845 06/09/18 0239 06/10/18 0500  Weight: 46.5 kg 46.3 kg 52.2 kg    Examination: General chronically ill appearing 57 year old female. Currently on SBT. No distress. Does have some accessory use but suspect close to baseline Pulm: decreased t/o no sig wheeze. Flow/volume loops still showing some obstruction. Vts able to pull in excess of 900 ml on PSV of 5 cmH2O Card RRR no MRG abd soft not tender + bowel sounds no OM Gu: clear yellow Neuro: awake. Moves all ext no focal def.  Ext no edema  brisk Cr strong pulses.   Consults: date of consult/date signed off & final recs:    Procedures: ETT 9/2 >>   Significant Diagnostic Tests: UDS 9/2 >> cocaine positive   Micro Data:   Antimicrobials:  Azithromycin 9/2 >>   Resolved Hospital Problem list     Assessment & Plan:   Acute on Chronic Hypoxemic / Hypercarbic Respiratory Failure - multifactorial in the setting of substance abuse, lack of functioning O2 and AECOPD  Plan PAD 0  Extubate Cont scheduled BDs Taper steroids Day 2/5 azith  Wean oxygen  Mobilize   Polysubstance Abuse  Plan Will need smoking and drug abuse counciling   Anxiety, Schizophrenia & Bipolar Disorder  Plan Cont zyprexa and prozac Holding depakote as has not been filled since 4/19 PAD protocol (see respiratory failure above)  Fluid and electrolyte imbalance: hypophosphatemia and hypomagnesemia  Plan Replaced will recheck   Hypothyroidism  Plan Cont synthroid   Hypertension  Plan Holding antihypertensives  Anemia of chronic disease Plan Trend CBC  Disposition / Summary of Today's Plan 06/10/18   Awake. Looks ready to extubate. Assuming no issues will cont to rx as AECOPD   Best Practice / Goals of Care / Disposition.   DVT prophylaxis: SCD's, heparin  GI prophylaxis: Pepcid  Diet: NPO  Mobility:Bedrest  Code Status: Full  code  Family Communication: Significant other asleep in chair at bedside.    Labs   CBC: Recent Labs  Lab 06/08/18 1413 06/09/18 0350 06/10/18 0610  WBC 7.5 6.5 5.7  NEUTROABS 6.3  --   --   HGB 13.0 13.0 11.1*  HCT 46.3* 45.1 35.1*  MCV 95.3 91.5 84.8  PLT 187 184 497   Basic Metabolic Panel: Recent Labs  Lab 06/08/18 1413 06/09/18 0350 06/10/18 0318  NA 143 144 QUESTIONABLE RESULTS, RECOMMEND RECOLLECT TO VERIFY  K 3.9 4.6 QUESTIONABLE RESULTS, RECOMMEND RECOLLECT TO VERIFY  CL 90* 101 QUESTIONABLE RESULTS, RECOMMEND RECOLLECT TO VERIFY  CO2 46* 29 QUESTIONABLE RESULTS, RECOMMEND  RECOLLECT TO VERIFY  GLUCOSE 133* 122* QUESTIONABLE RESULTS, RECOMMEND RECOLLECT TO VERIFY  BUN 16 21* QUESTIONABLE RESULTS, RECOMMEND RECOLLECT TO VERIFY  CREATININE 0.44 0.57 QUESTIONABLE RESULTS, RECOMMEND RECOLLECT TO VERIFY  CALCIUM 9.2 8.8* QUESTIONABLE RESULTS, RECOMMEND RECOLLECT TO VERIFY  MG  --  1.6*  --   PHOS  --  1.8*  --    GFR: CrCl cannot be calculated (This lab value cannot be used to calculate CrCl because it is not a number: QUESTIONABLE RESULTS, RECOMMEND RECOLLECT TO VERIFY). Recent Labs  Lab 06/08/18 1413 06/08/18 1433 06/08/18 1643 06/09/18 0350 06/10/18 0610  WBC 7.5  --   --  6.5 5.7  LATICACIDVEN  --  1.16 2.03*  --   --    Liver Function Tests: Recent Labs  Lab 06/08/18 1413  AST 20  ALT 16  ALKPHOS 63  BILITOT 0.3  PROT 6.7  ALBUMIN 3.3*   No results for input(s): LIPASE, AMYLASE in the last 168 hours. Recent Labs  Lab 06/08/18 1413  AMMONIA 50*   ABG    Component Value Date/Time   PHART 7.462 (H) 06/09/2018 0436   PCO2ART 43.0 06/09/2018 0436   PO2ART 141 (H) 06/09/2018 0436   HCO3 30.0 (H) 06/09/2018 0436   TCO2 46 (H) 01/26/2018 1026   ACIDBASEDEF 0.4 06/30/2014 1033   O2SAT 98.9 06/09/2018 0436    Coagulation Profile: No results for input(s): INR, PROTIME in the last 168 hours.   Cardiac Enzymes: No results for input(s): CKTOTAL, CKMB, CKMBINDEX, TROPONINI in the last 168 hours.   HbA1C: Hgb A1c MFr Bld  Date/Time Value Ref Range Status  02/26/2016 04:15 PM 6.2 (H) 4.8 - 5.6 % Final    Comment:    (NOTE)         Pre-diabetes: 5.7 - 6.4         Diabetes: >6.4         Glycemic control for adults with diabetes: <7.0    CBG: Recent Labs  Lab 06/09/18 1150 06/09/18 1603 06/09/18 1928 06/09/18 2331 06/10/18 0308  GLUCAP 136* 93 104* [place] ACNP-BC [place] Pager # [phone] OR # [phone] if no answer

## 2018-06-10 NOTE — Procedures (Signed)
Extubation Procedure Note  Patient Details:   Name: Courtney Terry DOB: 24-Aug-1961 MRN: [id number]   Airway Documentation:    Vent end date: 06/10/18 Vent end time: 1010   Evaluation  O2 sats: stable throughout Complications: No apparent complications Patient did tolerate procedure well. Bilateral Breath Sounds: Clear, Diminished   Yes  Tamala Julian 06/10/2018, 10:24 AM

## 2018-06-11 LAB — GLUCOSE, CAPILLARY
GLUCOSE-CAPILLARY: 189 mg/dL — AB (ref 70–99)
GLUCOSE-CAPILLARY: 99 mg/dL (ref 70–99)
Glucose-Capillary: 111 mg/dL — ABNORMAL HIGH (ref 70–99)
Glucose-Capillary: 87 mg/dL (ref 70–99)
Glucose-Capillary: 116 mg/dL — ABNORMAL HIGH (ref 70–99)

## 2018-06-11 MED ORDER — ENSURE ENLIVE PO LIQD
237.0000 mL | Freq: Three times a day (TID) | ORAL | Status: DC
  Administered 2018-06-11 – 2018-06-12 (×4): 237 mL via ORAL

## 2018-06-11 MED ORDER — INSULIN ASPART 100 UNIT/ML ~~LOC~~ SOLN
0.0000 [IU] | Freq: Three times a day (TID) | SUBCUTANEOUS | Status: DC
  Administered 2018-06-11: 2 [IU] via SUBCUTANEOUS
  Administered 2018-06-12: 1 [IU] via SUBCUTANEOUS

## 2018-06-11 MED ORDER — FAMOTIDINE 20 MG PO TABS
20.0000 mg | ORAL_TABLET | Freq: Two times a day (BID) | ORAL | Status: DC
  Administered 2018-06-11 – 2018-06-12 (×2): 20 mg via ORAL
  Filled 2018-06-11 (×2): qty 1

## 2018-06-11 MED ORDER — ADULT MULTIVITAMIN W/MINERALS CH
1.0000 | ORAL_TABLET | Freq: Every day | ORAL | Status: DC
  Administered 2018-06-11 – 2018-06-12 (×2): 1 via ORAL
  Filled 2018-06-11 (×2): qty 1

## 2018-06-11 MED ORDER — PREDNISONE 20 MG PO TABS
40.0000 mg | ORAL_TABLET | Freq: Every day | ORAL | Status: DC
  Administered 2018-06-11 – 2018-06-12 (×2): 40 mg via ORAL
  Filled 2018-06-11 (×2): qty 2

## 2018-06-11 MED ORDER — ENSURE ENLIVE PO LIQD: 237.0000 mL | Freq: Two times a day (BID) | ORAL | Status: DC

## 2018-06-11 NOTE — Progress Notes (Signed)
a Progress Note   Date: 06/11/2018  Patient Name: Courtney Terry        MRN#: [id number]  Clarification of diagnosis:  Severe malnutrition   Clementeen Graham, NP

## 2018-06-11 NOTE — Progress Notes (Signed)
Patient refusing to get out of bed, sit in a chair or ambulate. Prefers to stay in bed, despite multiple attempts by RN to encourage her to ambulate. Patient did fine when she ambulated out of bed in the morning to use the [facility].  VS stable. Sats 98 % on 3 L [place]. Will continue to monitor and work with patient to ambulate.

## 2018-06-11 NOTE — Progress Notes (Addendum)
Nutrition Follow-up  DOCUMENTATION CODES:   Severe malnutrition in context of social or environmental circumstances  INTERVENTION:  - Will order Ensure Enlive TID, each supplement provides 350 kcal and 20 grams of protein. - Will order daily multivitamin with minerals.  - Continue to encourage PO intakes.    NUTRITION DIAGNOSIS:   Severe Malnutrition related to social / environmental circumstances(polysubstance abuse) as evidenced by percent weight loss, energy intake < or equal to 50% for > or equal to 1 month, moderate fat depletion, moderate muscle depletion, severe muscle depletion.  GOAL:   Patient will meet greater than or equal to 90% of their needs  MONITOR:   PO intake, Supplement acceptance, Weight trends, Labs, Skin  ASSESSMENT:   Patient with PMH significant for schizophrenia, stage I breast cancer ( s/p lumpectomy 2013), hypertension, COPD, polysubstance abuse, seizure disorder, and chronic respiratory failure on 4 L/min at baseline. Presents this admission in acute respiratory distress requiring intubation.   Patient extubated and OGT removed yesterday around 10:20 AM. Diet advanced to CLD at 1:37 PM and to Regular at 3:30 PM. Patient identified with severe malnutrition so will add ONS to increase kcal and protein provision and intake. Dr. Matilde Bash note this AM states patient is stable for transfer out of ICU.    Medications reviewed; 20 mg oral Pepcid BID, sliding scale Novolog, 75 mcg oral Synthroid/day, 40 mg Deltasone/day.  Labs reviewed; CBGs: 111, 87, and 189 mg/dL today, BUN: 25 mg/dL.    Diet Order:   Diet Order            Diet regular Room service appropriate? Yes; Fluid consistency: Thin  Diet effective now              EDUCATION NEEDS:   Not appropriate for education at this time  Skin:  Skin Assessment: Skin Integrity Issues: Skin Integrity Issues:: Stage II Stage II: sacrum  Last BM:  PTA  Height:   Ht Readings from Last 1  Encounters:  06/08/18 5\' 2"  (1.575 m)    Weight:   Wt Readings from Last 1 Encounters:  06/11/18 52.4 kg    Ideal Body Weight:  50 kg  BMI:  Body mass index is 21.13 kg/m.  Estimated Nutritional Needs:   Kcal:  1570-1830 (30-35 kcal/kg)  Protein:  60-75 grams  Fluid:  >/= 1.5 L/day     Jarome Matin, MS, RD, LDN, [facility] Inpatient Clinical Dietitian Pager # [phone] After hours/weekend pager # [phone]

## 2018-06-11 NOTE — Progress Notes (Signed)
The patient is receiving Famotidine by the intravenous route.  Based on criteria approved by the Pharmacy and [place], the medication is being converted to the equivalent oral dose form.  These criteria include: -No active GI bleeding -Able to tolerate diet of full liquids (or better) or tube feeding -Able to tolerate other medications by the oral or enteral route  If you have any questions about this conversion, please contact the Pharmacy Department (phone [phone]).  Thank you.  Minda Ditto PharmD Pager [phone] 06/11/2018, 11:51 AM

## 2018-06-11 NOTE — Progress Notes (Signed)
Courtney Terry  HYQ:[id number] DOB: August 30, 1961 DOA: 06/08/2018 PCP: [name], MD    LOS: 3 days   Reason for Consult / Chief Complaint:  Shortness of Breath   Consulting MD and Date:  9/2, Dr. Sedonia Small  HPI / Summary of Hospital Stay:  57 y/o F w/known history of emphysema, COPD requiring O2.  Admitted to crack cocaine but unclear if this is accurate due to encephalopathy/hypoxia. Reportedly oxygen was not hooked up on EMS arrival 9/2:  Intubated in the ER for hypoxic / hypercarbic respiratory failure.  Mental status improved post intubation. Admitted to ICU.  CXR negative for acute process.  Treated for AECOPD 9/3: failed weaning attempt. Max temp 100.4, trying to cut back on sedation  9/4: more awake. Excellent Vt on PSV of 5. Extubated.  9/5: Night tolerated extubation the day prior now weaning oxygen  Subjective:  Denies shortness of breath and feels like she is improving  Objective   Blood pressure 113/76, pulse 72, temperature 98.6 F (37 C), resp. rate (Abnormal) 22, height 5\' 2"  (1.575 m), weight 52.4 kg, SpO2 99 %.        Intake/Output Summary (Last 24 hours) at 06/11/2018 0816 Last data filed at 06/11/2018 0600 Gross per 24 hour  Intake 1029.75 ml  Output 1100 ml  Net -70.25 ml   Filed Weights   06/09/18 0239 06/10/18 0500 06/11/18 0500  Weight: 46.3 kg 52.2 kg 52.4 kg    Examination:  General: Frail 57 year old female appears much older than stated age 55 normocephalic atraumatic no jugular venous distention. Pulmonary: Distant expiratory wheeze, no accessory use Cardiac: Regular rate and rhythm Abdomen: Soft, nontender positive bowel sounds Extremities: Brisk cap refill warm no edema Neuro: Awake oriented no focal deficits Consults: date of consult/date signed off & final recs:    Procedures: ETT 9/2 >> 9/4  Significant Diagnostic Tests: UDS 9/2 >> cocaine positive   Micro Data:   Antimicrobials:  Azithromycin 9/2 >>   Resolved  Hospital Problem list     Assessment & Plan:   Acute on Chronic Hypoxemic / Hypercarbic Respiratory Failure - multifactorial in the setting of substance abuse, lack of functioning O2 and AECOPD  Plan Mobilize Continue scheduled bronchodilators Taper steroids Day #3 of 5 azithromycin Wean oxygen Smoking cessation   Polysubstance Abuse  Plan  will consult smoking cessation nurse  Anxiety, Schizophrenia & Bipolar Disorder  Plan Continue Zyprexa Prozac   Fluid and electrolyte imbalance: hypophosphatemia and hypomagnesemia  Plan Repeat a.m. chemistry  Hypothyroidism  Plan Synthroid  Hypertension  Plan Antihypertensives  Anemia of chronic disease Plan Trend CBC  Disposition / Summary of Today's Plan 06/11/18   Looks good post extubation.  We will mobilize, continue taper steroids, advance diet.  Change her to stepdown unit setting.  She can go to the triad service effective 9/6. Will order smoking cessation and make f/u in our office   Best Practice / Goals of Care / Disposition.   DVT prophylaxis: SCD's, heparin  GI prophylaxis: Pepcid  Diet:  advance as tolerated effective 9/5 Mobility mobilize Code Status: Full code  Family Communication: Significant other asleep in chair at bedside.    Labs   CBC: Recent Labs  Lab 06/08/18 1413 06/09/18 0350 06/10/18 0610  WBC 7.5 6.5 5.7  NEUTROABS 6.3  --   --   HGB 13.0 13.0 11.1*  HCT 46.3* 45.1 35.1*  MCV 95.3 91.5 84.8  PLT 187 184 952   Basic Metabolic Panel:  Recent Labs  Lab 06/08/18 1413 06/09/18 0350 06/10/18 0318 06/10/18 1055  NA 143 144 QUESTIONABLE RESULTS, RECOMMEND RECOLLECT TO VERIFY 143  K 3.9 4.6 QUESTIONABLE RESULTS, RECOMMEND RECOLLECT TO VERIFY 4.8  CL 90* 101 QUESTIONABLE RESULTS, RECOMMEND RECOLLECT TO VERIFY 105  CO2 46* 29 QUESTIONABLE RESULTS, RECOMMEND RECOLLECT TO VERIFY 30  GLUCOSE 133* 122* QUESTIONABLE RESULTS, RECOMMEND RECOLLECT TO VERIFY 100*  BUN 16 21* QUESTIONABLE RESULTS,  RECOMMEND RECOLLECT TO VERIFY 25*  CREATININE 0.44 0.57 QUESTIONABLE RESULTS, RECOMMEND RECOLLECT TO VERIFY 0.50  CALCIUM 9.2 8.8* QUESTIONABLE RESULTS, RECOMMEND RECOLLECT TO VERIFY 9.1  MG  --  1.6*  --   --   PHOS  --  1.8*  --   --    GFR: Estimated Creatinine Clearance: 62.1 mL/min (by C-G formula based on SCr of 0.5 mg/dL). Recent Labs  Lab 06/08/18 1413 06/08/18 1433 06/08/18 1643 06/09/18 0350 06/10/18 0610  WBC 7.5  --   --  6.5 5.7  LATICACIDVEN  --  1.16 2.03*  --   --    Liver Function Tests: Recent Labs  Lab 06/08/18 1413  AST 20  ALT 16  ALKPHOS 63  BILITOT 0.3  PROT 6.7  ALBUMIN 3.3*   No results for input(s): LIPASE, AMYLASE in the last 168 hours. Recent Labs  Lab 06/08/18 1413  AMMONIA 50*   ABG    Component Value Date/Time   PHART 7.462 (H) 06/09/2018 0436   PCO2ART 43.0 06/09/2018 0436   PO2ART 141 (H) 06/09/2018 0436   HCO3 30.0 (H) 06/09/2018 0436   TCO2 46 (H) 01/26/2018 1026   ACIDBASEDEF 0.4 06/30/2014 1033   O2SAT 98.9 06/09/2018 0436    Coagulation Profile: No results for input(s): INR, PROTIME in the last 168 hours.   Cardiac Enzymes: No results for input(s): CKTOTAL, CKMB, CKMBINDEX, TROPONINI in the last 168 hours.   HbA1C: Hgb A1c MFr Bld  Date/Time Value Ref Range Status  02/26/2016 04:15 PM 6.2 (H) 4.8 - 5.6 % Final    Comment:    (NOTE)         Pre-diabetes: 5.7 - 6.4         Diabetes: >6.4         Glycemic control for adults with diabetes: <7.0    CBG: Recent Labs  Lab 06/10/18 1538 06/10/18 2001 06/10/18 2333 06/11/18 0344 06/11/18 0752  GLUCAP 116* 89 [place] ACNP-BC [place] Pager # [phone] OR # [phone] if no answer

## 2018-06-12 ENCOUNTER — Ambulatory Visit: Payer: Medicaid Other | Admitting: Pulmonary Disease

## 2018-06-12 DIAGNOSIS — F209 Schizophrenia, unspecified: Secondary | ICD-10-CM

## 2018-06-12 DIAGNOSIS — F1910 Other psychoactive substance abuse, uncomplicated: Secondary | ICD-10-CM

## 2018-06-12 DIAGNOSIS — F319 Bipolar disorder, unspecified: Secondary | ICD-10-CM

## 2018-06-12 DIAGNOSIS — F17200 Nicotine dependence, unspecified, uncomplicated: Secondary | ICD-10-CM

## 2018-06-12 DIAGNOSIS — E039 Hypothyroidism, unspecified: Secondary | ICD-10-CM

## 2018-06-12 LAB — GLUCOSE, CAPILLARY
Glucose-Capillary: 104 mg/dL — ABNORMAL HIGH (ref 70–99)
Glucose-Capillary: 127 mg/dL — ABNORMAL HIGH (ref 70–99)
Glucose-Capillary: 137 mg/dL — ABNORMAL HIGH (ref 70–99)

## 2018-06-12 MED ORDER — ADULT MULTIVITAMIN W/MINERALS CH: 1.0000 | ORAL_TABLET | Freq: Every day | ORAL | 0 refills | Status: DC

## 2018-06-12 MED ORDER — INFLUENZA VAC SPLIT QUAD 0.5 ML IM SUSY
0.5000 mL | PREFILLED_SYRINGE | INTRAMUSCULAR | Status: AC | PRN
  Administered 2018-06-12: 0.5 mL via INTRAMUSCULAR
  Filled 2018-06-12: qty 0.5

## 2018-06-12 MED ORDER — ALBUTEROL SULFATE (2.5 MG/3ML) 0.083% IN NEBU: 2.5000 mg | INHALATION_SOLUTION | RESPIRATORY_TRACT | 2 refills | Status: DC | PRN

## 2018-06-12 MED ORDER — PREDNISONE 20 MG PO TABS: ORAL_TABLET | ORAL | 0 refills | Status: DC

## 2018-06-12 MED ORDER — INFLUENZA VAC SPLIT QUAD 0.5 ML IM SUSY: 0.5000 mL | PREFILLED_SYRINGE | INTRAMUSCULAR | Status: DC

## 2018-06-12 MED ORDER — DIVALPROEX SODIUM 250 MG PO DR TAB
500.0000 mg | DELAYED_RELEASE_TABLET | Freq: Two times a day (BID) | ORAL | Status: DC
  Administered 2018-06-12: 500 mg via ORAL
  Filled 2018-06-12: qty 2

## 2018-06-12 MED ORDER — IPRATROPIUM-ALBUTEROL 0.5-2.5 (3) MG/3ML IN SOLN: 3.0000 mL | Freq: Three times a day (TID) | RESPIRATORY_TRACT | Status: DC

## 2018-06-12 MED ORDER — ENSURE ENLIVE PO LIQD: 237.0000 mL | Freq: Three times a day (TID) | ORAL | 12 refills | Status: DC

## 2018-06-12 MED ORDER — DIPHENHYDRAMINE HCL 50 MG PO CAPS: 50.0000 mg | ORAL_CAPSULE | Freq: Every evening | ORAL | 0 refills | Status: DC | PRN

## 2018-06-12 MED ORDER — PANTOPRAZOLE SODIUM 40 MG PO TBEC
40.0000 mg | DELAYED_RELEASE_TABLET | Freq: Every day | ORAL | Status: DC
  Administered 2018-06-12: 40 mg via ORAL
  Filled 2018-06-12: qty 1

## 2018-06-12 MED ORDER — BUDESONIDE 0.5 MG/2ML IN SUSP: 0.5000 mg | Freq: Two times a day (BID) | RESPIRATORY_TRACT | 12 refills | Status: DC

## 2018-06-12 MED ORDER — ALBUTEROL SULFATE HFA 108 (90 BASE) MCG/ACT IN AERS: 2.0000 | INHALATION_SPRAY | RESPIRATORY_TRACT | 0 refills | Status: DC | PRN

## 2018-06-12 MED ORDER — FAMOTIDINE 20 MG PO TABS: 20.0000 mg | ORAL_TABLET | Freq: Every day | ORAL | Status: DC

## 2018-06-12 NOTE — Discharge Summary (Signed)
Physician Discharge Summary  Courtney Terry WYO:[id number] DOB: 06-Sep-1961 DOA: 06/08/2018  PCP: [name], MD  Admit date: 06/08/2018 Discharge date: 06/12/2018  Admitted From: Home Disposition: Home  Recommendations for Outpatient Follow-up:  1. Continue to encourage cessation of substance abuse  Discharge Condition: Stable CODE STATUS: Full code Consultations:  Pulmonary critical care   Discharge Diagnoses:  Active Problems:   COPD GOLD IV B   COPD exacerbation (HCC)   Schizophrenia (HCC)   Polysubstance abuse (HCC)   Acute and chronic respiratory failure with hypercapnia (HCC)   Hypothyroidism   Bipolar I disorder (HCC)   Tobacco use disorder     Brief Summary: Courtney Terry 57 y/o with h/o seizure disorder, breast CA, polysubstance abuse, COPD requiring O2 found on the floor by her roommate. O2 tubing was twisted and it did not appear she was receiving O2. In the ED she admitted to crack cocaine use. She was intubated in the ED. Extubated on 9/4.   Hospital Course:  Acute respiratory failure requiring intubation - COPD exacerbation in setting of substance abuse - likely due smoking Crack and ongoing cigarette smoking - she states she used it the day she came into the ED-she has a friend and caretaker at bedside who states that he has told her multiple times that she needs to stop using cocaine -UDS was positive for cocaine- I have counseled her to stop using all illegal drugs and have requested a social work consult - she was intubated 2 days ago and is quite stable today with clear lungs and a pulse ox of 96% on room air (I noted that her oxygen tubing was on her forehead and pulse ox was in high 90s)- lung clear on exam- no cough, fevers or leukocytosis - cont Prednisone taper on discharge - today is day 5 of Azithromycin and she does not need it any further  Bipolar disorder/schizophrenia -Continue home medications which include fluoxetine, olanzapine  and divalproex  History of breast cancer  Hypothyroidism Continue Synthroid    Discharge Exam: Vitals:   06/12/18 0900 06/12/18 1200  BP:  130/70  Pulse: 76 96  Resp: 14 (!) 32  Temp:    SpO2: 100% 98%   Vitals:   06/12/18 0800 06/12/18 0851 06/12/18 0900 06/12/18 1200  BP: 125/72   130/70  Pulse: 82 92 76 96  Resp: 14 18 14  (!) 32  Temp: 98.3 F (36.8 C)     TempSrc: Oral     SpO2: 97% 95% 100% 98%  Weight:      Height:        General: Pt is alert, awake, not in acute distress Cardiovascular: RRR, S1/S2 +, no rubs, no gallops Respiratory: CTA bilaterally, no wheezing, no rhonchi Abdominal: Soft, NT, ND, bowel sounds + Extremities: no edema, no cyanosis   Discharge Instructions  Discharge Instructions    Ambulatory referral to Smoking Cessation Program   Complete by:  As directed    Diet - low sodium heart healthy   Complete by:  As directed    Increase activity slowly   Complete by:  As directed      Allergies as of 06/12/2018      Reactions   Levaquin [levofloxacin] Hives      Medication List    TAKE these medications   acetaZOLAMIDE 250 MG tablet Commonly known as:  DIAMOX Take 1 tablet (250 mg total) by mouth daily.   albuterol 108 (90 Base) MCG/ACT inhaler Commonly known as:  PROVENTIL HFA;VENTOLIN HFA Inhale 2 puffs into the lungs every 4 (four) hours as needed for wheezing or shortness of breath. What changed:  Another medication with the same name was changed. Make sure you understand how and when to take each.   albuterol (2.5 MG/3ML) 0.083% nebulizer solution Commonly known as:  PROVENTIL Take 3 mLs (2.5 mg total) by nebulization every 4 (four) hours as needed for wheezing or shortness of breath. What changed:  See the new instructions.   amLODipine 5 MG tablet Commonly known as:  NORVASC Take 1 tablet (5 mg total) by mouth daily.   anastrozole 1 MG tablet Commonly known as:  ARIMIDEX TAKE 1 TABLET (1 MG TOTAL) BY MOUTH DAILY.    arformoterol 15 MCG/2ML Nebu Commonly known as:  BROVANA Take 15 mcg by nebulization 2 (two) times daily.   budesonide 0.5 MG/2ML nebulizer solution Commonly known as:  PULMICORT Take 2 mLs (0.5 mg total) by nebulization 2 (two) times daily.   diphenhydrAMINE 50 MG capsule Commonly known as:  BENADRYL Take 1 capsule (50 mg total) by mouth at bedtime as needed. What changed:    when to take this  reasons to take this   divalproex 500 MG DR tablet Commonly known as:  DEPAKOTE Take 500 mg by mouth 2 (two) times daily.   famotidine 20 MG tablet Commonly known as:  PEPCID One at bedtime What changed:    how much to take  how to take this  when to take this  additional instructions   feeding supplement (ENSURE ENLIVE) Liqd Take 237 mLs by mouth 3 (three) times daily between meals.   FLUoxetine 40 MG capsule Commonly known as:  PROZAC Take 40 mg by mouth daily.   furosemide 40 MG tablet Commonly known as:  LASIX Take 1 tablet (40 mg total) by mouth every other day.   Glycopyrrolate-Formoterol 9-4.8 MCG/ACT Aero Inhale 2 puffs into the lungs 2 (two) times daily.   levothyroxine 75 MCG tablet Commonly known as:  SYNTHROID, LEVOTHROID Take 1 tablet (75 mcg total) daily by mouth.   multivitamin with minerals Tabs tablet Take 1 tablet by mouth daily. Start taking on:  06/13/2018   OLANZapine zydis 20 MG disintegrating tablet Commonly known as:  ZYPREXA Take 20 mg by mouth at bedtime.   omeprazole 40 MG capsule Commonly known as:  PRILOSEC Take 30-60 min before first meal of the day What changed:    how much to take  how to take this  when to take this   OXYGEN Inhale 2 L into the lungs as needed.   predniSONE 20 MG tablet Commonly known as:  DELTASONE Take 40 mg tomorrow, 30 mg for 2 days, 20 mg for 2 days, 10 mg for 2 days and then 5 mg for 2 days      Follow-up Information    Lauraine Rinne, NP Follow up on 06/22/2018.   Specialty:  Pulmonary  Disease Why:  1030  Contact information: [address] [place] [address] [phone]          Allergies  Allergen Reactions  . Levaquin [Levofloxacin] Hives     Procedures/Studies: Intubation/extubation  Dg Chest 2 View  Result Date: 06/08/2018 CLINICAL DATA:  Hypoxia. EXAM: CHEST - 2 VIEW COMPARISON:  Chest x-ray dated March 16, 2018. FINDINGS: The heart size and mediastinal contours are within normal limits. Normal pulmonary vascularity. Unchanged severe hyperinflation No focal consolidation, pleural effusion, or pneumothorax. No acute osseous abnormality. IMPRESSION:  COPD.  No active cardiopulmonary disease. Electronically Signed   By: Titus Dubin M.D.   On: 06/08/2018 14:19   Dg Chest Port 1 View  Result Date: 06/09/2018 CLINICAL DATA:  COPD EXAM: PORTABLE CHEST 1 VIEW COMPARISON:  Portable exam 1301 hours compared to 06/09/2018 at 0431 hours FINDINGS: Endotracheal tube, nasogastric tube and LEFT jugular line unchanged. Normal heart size, mediastinal contours, and pulmonary vascularity. Emphysematous changes without infiltrate, pleural effusion or pneumothorax. Surgical clips RIGHT axilla. BILATERAL breast prostheses. Osseous demineralization. IMPRESSION: COPD changes. No acute abnormalities. Electronically Signed   By: Lavonia Dana M.D.   On: 06/09/2018 13:36   Dg Chest Port 1 View  Result Date: 06/09/2018 CLINICAL DATA:  57 year old female. Endotracheal tube position. Subsequent encounter. EXAM: PORTABLE CHEST 1 VIEW COMPARISON:  06/08/2018 chest x-ray. FINDINGS: Endotracheal tube tip midline 10.4 cm above the carina. Left central line tip distal superior vena cava level. Hyperinflated lungs. No infiltrate or congestive heart failure. No pneumothorax. Heart size within normal limits. Right axillary lymph node dissection. Breast prostheses in place. No acute osseous abnormality. IMPRESSION: 1. Endotracheal tube tip 10.4 cm above the carina. 2. Left central line tip  distal superior vena cava level. 3. Hyperinflated lungs. Electronically Signed   By: Genia Del M.D.   On: 06/09/2018 07:05   Dg Chest Port 1 View  Result Date: 06/08/2018 CLINICAL DATA:  Central line placement EXAM: PORTABLE CHEST 1 VIEW COMPARISON:  06/08/2018, 03/16/2018 FINDINGS: Hyperinflation with emphysematous disease. Endotracheal tube tip at approximate T3-T4 level. Esophageal tube tip below the diaphragm but non included. Lung bases are not included. New left IJ central venous catheter with tip overlying the SVC. Negative for left pneumothorax. Surgical clips in the right axilla IMPRESSION: 1. Left-sided central venous catheter tip overlies the SVC. There is no left pneumothorax 2. Hyperinflation with emphysematous disease. Electronically Signed   By: Donavan Foil M.D.   On: 06/08/2018 20:15   Dg Chest Port 1 View  Result Date: 06/08/2018 CLINICAL DATA:  Found semi conscious. EXAM: PORTABLE CHEST 1 VIEW COMPARISON:  Earlier today FINDINGS: New endotracheal tube with tip between the clavicular heads and carina. An orogastric tube reaches the stomach. Large lung volumes. There is no edema, consolidation, effusion, or pneumothorax. Normal heart size. Postoperative right breast and axilla. IMPRESSION: 1. Unremarkable positioning of endotracheal and orogastric tubes. 2. No evidence of acute cardiopulmonary disease. 3. COPD. Electronically Signed   By: Monte Fantasia M.D.   On: 06/08/2018 17:35     The results of significant diagnostics from this hospitalization (including imaging, microbiology, ancillary and laboratory) are listed below for reference.     Microbiology: Recent Results (from the past 240 hour(s))  MRSA PCR Screening     Status: None   Collection Time: 06/09/18 11:18 AM  Result Value Ref Range Status   MRSA by PCR NEGATIVE NEGATIVE Final    Comment:        The GeneXpert MRSA Assay (FDA approved for NASAL specimens only), is one component of a comprehensive MRSA  colonization surveillance program. It is not intended to diagnose MRSA infection nor to guide or monitor treatment for MRSA infections. Performed at [facility], [place] [address] [place], [place] [address]      Labs: BNP (last 3 results) Recent Labs    11/11/17 0911 01/26/18 0458 06/08/18 1413  BNP 1,048.2* 958.4* 88.4   Basic Metabolic Panel: Recent Labs  Lab 06/08/18 1413 06/09/18 0350 06/10/18 0318 06/10/18 1055  NA 143 144  QUESTIONABLE RESULTS, RECOMMEND RECOLLECT TO VERIFY 143  K 3.9 4.6 QUESTIONABLE RESULTS, RECOMMEND RECOLLECT TO VERIFY 4.8  CL 90* 101 QUESTIONABLE RESULTS, RECOMMEND RECOLLECT TO VERIFY 105  CO2 46* 29 QUESTIONABLE RESULTS, RECOMMEND RECOLLECT TO VERIFY 30  GLUCOSE 133* 122* QUESTIONABLE RESULTS, RECOMMEND RECOLLECT TO VERIFY 100*  BUN 16 21* QUESTIONABLE RESULTS, RECOMMEND RECOLLECT TO VERIFY 25*  CREATININE 0.44 0.57 QUESTIONABLE RESULTS, RECOMMEND RECOLLECT TO VERIFY 0.50  CALCIUM 9.2 8.8* QUESTIONABLE RESULTS, RECOMMEND RECOLLECT TO VERIFY 9.1  MG  --  1.6*  --   --   PHOS  --  1.8*  --   --    Liver Function Tests: Recent Labs  Lab 06/08/18 1413  AST 20  ALT 16  ALKPHOS 63  BILITOT 0.3  PROT 6.7  ALBUMIN 3.3*   No results for input(s): LIPASE, AMYLASE in the last 168 hours. Recent Labs  Lab 06/08/18 1413  AMMONIA 50*   CBC: Recent Labs  Lab 06/08/18 1413 06/09/18 0350 06/10/18 0610  WBC 7.5 6.5 5.7  NEUTROABS 6.3  --   --   HGB 13.0 13.0 11.1*  HCT 46.3* 45.1 35.1*  MCV 95.3 91.5 84.8  PLT 187 184 166   Cardiac Enzymes: No results for input(s): CKTOTAL, CKMB, CKMBINDEX, TROPONINI in the last 168 hours. BNP: Invalid input(s): POCBNP CBG: Recent Labs  Lab 06/11/18 1157 06/11/18 1613 06/11/18 2145 06/12/18 0748 06/12/18 1248  GLUCAP 189* 116* 99 104* 127*   D-Dimer No results for input(s): DDIMER in the last 72 hours. Hgb A1c No results for input(s): HGBA1C in the last 72 hours. Lipid  Profile No results for input(s): CHOL, HDL, LDLCALC, TRIG, CHOLHDL, LDLDIRECT in the last 72 hours. Thyroid function studies No results for input(s): TSH, T4TOTAL, T3FREE, THYROIDAB in the last 72 hours.  Invalid input(s): FREET3 Anemia work up No results for input(s): VITAMINB12, FOLATE, FERRITIN, TIBC, IRON, RETICCTPCT in the last 72 hours. Urinalysis    Component Value Date/Time   COLORURINE YELLOW 06/08/2018 1614   APPEARANCEUR HAZY (A) 06/08/2018 1614   LABSPEC 1.016 06/08/2018 1614   PHURINE 6.0 06/08/2018 1614   GLUCOSEU NEGATIVE 06/08/2018 1614   HGBUR SMALL (A) 06/08/2018 1614   BILIRUBINUR NEGATIVE 06/08/2018 1614   KETONESUR NEGATIVE 06/08/2018 1614   PROTEINUR 100 (A) 06/08/2018 1614   UROBILINOGEN 0.2 03/06/2014 1921   NITRITE NEGATIVE 06/08/2018 1614   LEUKOCYTESUR NEGATIVE 06/08/2018 1614   Sepsis Labs Invalid input(s): PROCALCITONIN,  WBC,  LACTICIDVEN Microbiology Recent Results (from the past 240 hour(s))  MRSA PCR Screening     Status: None   Collection Time: 06/09/18 11:18 AM  Result Value Ref Range Status   MRSA by PCR NEGATIVE NEGATIVE Final    Comment:        The GeneXpert MRSA Assay (FDA approved for NASAL specimens only), is one component of a comprehensive MRSA colonization surveillance program. It is not intended to diagnose MRSA infection nor to guide or monitor treatment for MRSA infections. Performed at [facility], [place] [address] [place], [place] [address]      Time coordinating discharge in minutes: 19  SIGNED:   Debbe Odea, MD  [facility] 06/12/2018, 12:52 PM Pager   If 7PM-7AM, please contact night-coverage [email or web address] Password [email or web address]

## 2018-06-12 NOTE — Progress Notes (Signed)
Per MD orders, patient was to ambulate prior to being discharged. Patient refused to ambulate on multiple occassions and stated "I don't want to go home yet, I think another day." Patient educated by RN. MD made aware. New orders for social work placed by MD. Case management also consulted for aid/assistance with home medications.  Will continue to monitor.

## 2018-06-12 NOTE — Progress Notes (Signed)
CSW arrived to provide pt with [place] resources but pt had just D/C'd.  Please reconsult if future social work needs arise.  CSW signing off, as social work intervention is no longer needed.  Alphonse Guild. Monifah Freehling, LCSW, LCAS, CSI Clinical Social Worker Ph: [phone]

## 2018-06-12 NOTE — Progress Notes (Signed)
Patient education provided to patient and "caregiver," Legrand Como. Patient and Legrand Como in a rush to leave and insisted on hurrying up with the education. RN emphasized importance of obtaining prescribed medications as soon as possible and to schedule follow-up appointment.  PIVs removed. RN and NT wheeled patient down to main entrance where patient was picked up by taxi. Taxi voucher provided.

## 2018-06-12 NOTE — Care Management (Addendum)
  Discharge Readiness Milestones  4th day of hospital review Goal Length of Stay: Ambulatory or 2 days  Note: Goal Length of Stay assumes optimal recovery, decision making, and care. Patients may be discharged to a lower level of care (either later than or sooner than the goal) when it is appropriate for their clinical status and care needs. Discharge Readiness Return to top of Chronic Obstructive Pulmonary Disease RRG - ISC  Discharge readiness is indicated by patient meeting Recovery Milestones, including ALL of the following: ? Hemodynamic stability 57 year old with severe COPD presents with acute on chronic respiratory failure, COPD exacerbation, cocaine use. Patient states that she ran out of her bevespi inhaler recently ? Mental status at baseline yes ? No evidence of infection, or outpatient treatment planned ? Eating and sleeping without frequent dyspnea/no ? Breathing comfortably at rest/yes ? Ambulatory ? Oral hydration, medications, and diet ? Being transferred from icu to medical floors ? [place] - Discharge Readiness Milestones,  Met

## 2018-06-12 NOTE — Progress Notes (Signed)
Attempting to provide discharge instructions. During review patient just trying to leave, emphasized importance of receiving instructions. Patients roommate present for teachings as well.
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
## 2018-06-22 ENCOUNTER — Encounter: Payer: Self-pay | Admitting: Pulmonary Disease

## 2018-06-22 ENCOUNTER — Ambulatory Visit (INDEPENDENT_AMBULATORY_CARE_PROVIDER_SITE_OTHER): Payer: Medicaid Other | Admitting: Pulmonary Disease

## 2018-06-22 DIAGNOSIS — J449 Chronic obstructive pulmonary disease, unspecified: Secondary | ICD-10-CM | POA: Diagnosis not present

## 2018-06-22 DIAGNOSIS — F1910 Other psychoactive substance abuse, uncomplicated: Secondary | ICD-10-CM

## 2018-06-22 DIAGNOSIS — J9622 Acute and chronic respiratory failure with hypercapnia: Secondary | ICD-10-CM | POA: Diagnosis not present

## 2018-06-22 DIAGNOSIS — E43 Unspecified severe protein-calorie malnutrition: Secondary | ICD-10-CM | POA: Diagnosis not present

## 2018-06-22 DIAGNOSIS — F1410 Cocaine abuse, uncomplicated: Secondary | ICD-10-CM

## 2018-06-22 DIAGNOSIS — F17210 Nicotine dependence, cigarettes, uncomplicated: Secondary | ICD-10-CM

## 2018-06-22 DIAGNOSIS — F17200 Nicotine dependence, unspecified, uncomplicated: Secondary | ICD-10-CM

## 2018-06-22 MED ORDER — GLYCOPYRROLATE-FORMOTEROL 9-4.8 MCG/ACT IN AERO: 2.0000 | INHALATION_SPRAY | Freq: Two times a day (BID) | RESPIRATORY_TRACT | 0 refills | Status: DC

## 2018-06-22 NOTE — Assessment & Plan Note (Signed)
Continue oxygen therapy as prescribed Continue to maintain oxygen saturations greater than 90%

## 2018-06-22 NOTE — Patient Instructions (Addendum)
Bevespi Aerosphere inhaler >>>2 puffs daily twice a day (4 puffs total daily) >>>This is not a rescue inhaler >>>You take this daily no matter what >>>samples provided today   We will also provide you with patient assistance program to help receive Bevespi at no cost if qualified Please fill out this paperwork today  Continue Prednisone 10mg  tablet daily   Stop: brovana and Pulmicort   Great job on already receiving her flu vaccine  Follow-up in 2 weeks     We recommend that you stop smoking.   Smoking Cessation Resources:  1 800 QUIT NOW  >>> Patient to call this resource and utilize it to help support her quit smoking >>> Keep up your hard work with stopping smoking  You can also contact the [facility] >>>For smoking cessation classes call [phone]  We do not recommend using e-cigarettes as a form of stopping smoking   Note your daily symptoms > remember "red flags" for COPD:   >>>Increase in cough >>>increase in sputum production >>>increase in shortness of breath or activity  intolerance.   If you notice these symptoms, please call the office to be seen.      It is flu season:   >>>Remember to be washing your hands regularly, using hand sanitizer, be careful to use around herself with has contact with people who are sick will increase her chances of getting sick yourself. >>> Best ways to protect herself from the flu: Receive the yearly flu vaccine, practice good hand hygiene washing with soap and also using hand sanitizer when available, eat a nutritious meals, get adequate rest, hydrate appropriately   Please contact the office if your symptoms worsen or you have concerns that you are not improving.   Thank you for choosing [facility] for your healthcare, and for allowing [place] to partner with you on your healthcare journey. I am thankful to be able to provide care to you today.   Wyn Quaker FNP-C        Home Oxygen Use,  Adult When a medical condition keeps you from getting enough oxygen, your health care provider may instruct you to take extra oxygen at home. Your health care provider will let you know:  When to take oxygen.  For how long to take oxygen.  How quickly oxygen should be delivered (flow rate), in liters per minute (LPM or L/M).  Home oxygen can be given through:  A mask.  A nasal cannula. This is a device or tube that goes in the nostrils.  A transtracheal catheter. This is a small, flexible tube placed in the trachea.  A tracheostomy. This is a surgically made opening in the trachea.  These devices are connected with tubing to an oxygen source, such as:  A tank. Tanks hold oxygen in gas form. They must be replaced when the oxygen is used up.  A liquid oxygen device. This holds oxygen in liquid form. It must be replaced when the oxygen is used up.  An oxygen concentrator machine. This filters oxygen in the room. It uses electricity, so you must have a backup cylinder of oxygen in case the power goes out.  Supplies needed: To use oxygen, you will need:  A mask, nasal cannula, transtracheal catheter, or tracheostomy.  An oxygen tank, a liquid oxygen device, or an oxygen concentrator.  The tape that your health care provider recommends (optional).  If you use a transtracheal catheter and your prescribed flow rate is 1 LPM or  greater, you will also need a humidifier. Risks and complications  Fire. This can happen if the oxygen is exposed to a heat source, flame, or spark.  Injury to skin. This can happen if liquid oxygen touches your skin.  Organ damage. This can happen if you get too little oxygen. How to use oxygen Your health care provider will show you how to use your oxygen device. Follow her or his instructions. They may look something like this: 1. Wash your hands. 2. If you use an oxygen concentrator, make sure it is plugged in. 3. Place one end of the tube into the  port on the tank, device, or machine. 4. Place the mask over your nose and mouth. Or, place the nasal cannula and secure it with tape if instructed. If you use a tracheostomy or transtracheal catheter, connect it to the oxygen source as directed. 5. Make sure the liter-flow setting on the machine is at the level prescribed by your health care provider. 6. Turn on the machine or adjust the knob on the tank or device to the correct liter-flow setting. 7. When you are done, turn off and unplug the machine, or turn the knob to OFF.  How to clean and care for the oxygen supplies Nasal cannula  Clean it with a warm, wet cloth daily or as needed.  Wash it with a liquid soap once a week.  Rinse it thoroughly once or twice a week.  Replace it every 2-4 weeks.  If you have an infection, such as a cold or pneumonia, change the cannula when you get better. Mask  Replace it every 2-4 weeks.  If you have an infection, such as a cold or pneumonia, change the mask when you get better. Humidifier bottle  Wash the bottle between each refill: ? Wash it with soap and warm water. ? Rinse it thoroughly. ? Disinfect it and its top. ? Air-dry it.  Make sure it is dry before you refill it. Oxygen concentrator  Clean the air filter at least twice a week according to directions from your home medical equipment and service company.  Wipe down the cabinet every day. To do this: ? Unplug the unit. ? Wipe down the cabinet with a damp cloth. ? Dry the cabinet. Other equipment  Change any extra tubing every 1-3 months.  Follow instructions from your health care provider about taking care of any other equipment. Safety tips Fire safety tips   Keep your oxygen and oxygen supplies at least 5 ft away from sources of heat, flames, and sparks at all times.  Do not allow smoking near your oxygen. Put up "no smoking" signs in your home.  Do not use materials that can burn (are flammable) while you use  oxygen.  When you go to a restaurant with portable oxygen, ask to be seated in the nonsmoking section.  Keep a Data processing manager close by. Let your fire department know that you have oxygen in your home.  Test your home smoke detectors regularly. General safety tips  If you use an oxygen cylinder, make sure it is in a stand or secured to an object that will not move (fixed object).  If you use liquid oxygen, make sure its container is kept upright.  If you use an oxygen concentrator: ? [employer] movement psych[employer]ist company. Make sure you are given priority service in the event that your power goes out. ? Avoid using extension cords, if possible. Follow these instructions at home:  Use  oxygen only as told by your health care provider.  Do not use alcohol or other drugs that make you relax (sedating drugs) unless instructed. They can slow down your breathing rate and make it hard to get in enough oxygen.  Know how and when to order a refill of oxygen.  Always keep a spare tank of oxygen. Plan ahead for holidays when you may not be able to get a prescription filled.  Use water-based lubricants on your lips or nostrils. Do not use oil-based products like petroleum jelly.  To prevent skin irritation on your cheeks or behind your ears, tuck some gauze under the tubing. Contact a health care provider if:  You get headaches often.  You have shortness of breath.  You have a lasting cough.  You have anxiety.  You are sleepy all the time.  You develop an illness that affects your breathing.  You cannot exercise at your regular level.  You are restless.  You have difficult or irregular breathing, and it is getting worse.  You have a fever.  You have persistent redness under your nose. Get help right away if:  You are confused.  You have blue lips or fingernails.  You are struggling to breathe. This information is not intended to replace advice given to you by your health care  provider. Make sure you discuss any questions you have with your health care provider. Document Released: 12/14/2003 Document Revised: 05/22/2016 Document Reviewed: 04/16/2016 Elsevier Interactive Patient Education  2018 [employer]  Chronic Obstructive Pulmonary Disease Chronic obstructive pulmonary disease (COPD) is a long-term (chronic) lung problem. When you have COPD, it is hard for air to get in and out of your lungs. The way your lungs work will never return to normal. Usually the condition gets worse over time. There are things you can do to keep yourself as healthy as possible. Your doctor may treat your condition with:  Medicines.  Quitting smoking, if you smoke.  Rehabilitation. This may involve a team of specialists.  Oxygen.  Exercise and changes to your diet.  Lung surgery.  Comfort measures (palliative care).  Follow these instructions at home: Medicines  Take over-the-counter and prescription medicines only as told by your doctor.  Talk to your doctor before taking any cough or allergy medicines. You may need to avoid medicines that cause your lungs to be dry. Lifestyle  If you smoke, stop. Smoking makes the problem worse. If you need help quitting, ask your doctor.  Avoid being around things that make your breathing worse. This may include smoke, chemicals, and fumes.  Stay active, but remember to also rest.  Learn and use tips on how to relax.  Make sure you get enough sleep. Most adults need at least 7 hours a night.  Eat healthy foods. Eat smaller meals more often. Rest before meals. Controlled breathing  Learn and use tips on how to control your breathing as told by your doctor. Try: ? Breathing in (inhaling) through your nose for 1 second. Then, pucker your lips and breath out (exhale) through your lips for 2 seconds. ? Putting one hand on your belly (abdomen). Breathe in slowly through your nose for 1 second. Your hand on your belly should move  out. Pucker your lips and breathe out slowly through your lips. Your hand on your belly should move in as you breathe out. Controlled coughing  Learn and use controlled coughing to clear mucus from your lungs. The steps are: 1. Lean your head  a little forward. 2. Breathe in deeply. 3. Try to hold your breath for 3 seconds. 4. Keep your mouth slightly open while coughing 2 times. 5. Spit any mucus out into a tissue. 6. Rest and do the steps again 1 or 2 times as needed. General instructions  Make sure you get all the shots (vaccines) that your doctor recommends. Ask your doctor about a flu shot and a pneumonia shot.  Use oxygen therapy and therapy to help improve your lungs (pulmonary rehabilitation) if told by your doctor. If you need home oxygen therapy, ask your doctor if you should buy a tool to measure your oxygen level (oximeter).  Make a COPD action plan with your doctor. This helps you know what to do if you feel worse than usual.  Manage any other conditions you have as told by your doctor.  Avoid going outside when it is very hot, cold, or humid.  Avoid people who have a sickness you can catch (contagious).  Keep all follow-up visits as told by your doctor. This is important. Contact a doctor if:  You cough up more mucus than usual.  There is a change in the color or thickness of the mucus.  It is harder to breathe than usual.  Your breathing is faster than usual.  You have trouble sleeping.  You need to use your medicines more often than usual.  You have trouble doing your normal activities such as getting dressed or walking around the house. Get help right away if:  You have shortness of breath while resting.  You have shortness of breath that stops you from: ? Being able to talk. ? Doing normal activities.  Your chest hurts for longer than 5 minutes.  Your skin color is more blue than usual.  Your pulse oximeter shows that you have low oxygen for longer  than 5 minutes.  You have a fever.  You feel too tired to breathe normally. Summary  Chronic obstructive pulmonary disease (COPD) is a long-term lung problem.  The way your lungs work will never return to normal. Usually the condition gets worse over time. There are things you can do to keep yourself as healthy as possible.  Take over-the-counter and prescription medicines only as told by your doctor.  If you smoke, stop. Smoking makes the problem worse. This information is not intended to replace advice given to you by your health care provider. Make sure you discuss any questions you have with your health care provider. Document Released: 03/11/2008 Document Revised: 02/29/2016 Document Reviewed: 05/20/2013 Elsevier Interactive Patient Education  2017 [employer].

## 2018-06-22 NOTE — Assessment & Plan Note (Addendum)
Patient is already received flu vaccine this season  Patient to use reduce to quit method and reports that she will stop smoking after this last cigarette is finished.  We recommend that you stop smoking.   Smoking Cessation Resources:  1 800 QUIT NOW  >>> Patient to call this resource and utilize it to help support her quit smoking >>> Keep up your hard work with stopping smoking  You can also contact the [facility] >>>For smoking cessation classes call [phone]  We do not recommend using e-cigarettes as a form of stopping smoking

## 2018-06-22 NOTE — Assessment & Plan Note (Signed)
Bevespi Aerosphere inhaler >>>2 puffs daily twice a day (4 puffs total daily) >>>This is not a rescue inhaler >>>You take this daily no matter what >>>samples provided today   We will also provide you with patient assistance program to help receive Bevespi at no cost if qualified Please fill out this paperwork today  Continue Prednisone 10mg  tablet daily   Stop: brovana and Pulmicort   Great job on already receiving her flu vaccine  Follow-up in 2 weeks  We recommend that you stop smoking.   Smoking Cessation Resources:  1 800 QUIT NOW  >>> Patient to call this resource and utilize it to help support her quit smoking >>> Keep up your hard work with stopping smoking  You can also contact the [facility] >>>For smoking cessation classes call [phone]  We do not recommend using e-cigarettes as a form of stopping smoking   Note your daily symptoms > remember "red flags" for COPD:   >>>Increase in cough >>>increase in sputum production >>>increase in shortness of breath or activity  intolerance.   If you notice these symptoms, please call the office to be seen.

## 2018-06-22 NOTE — Assessment & Plan Note (Signed)
Emphasized the importance of the patient stop using cocaine.  Patient reported that she recently used it 2 weeks ago.  Explained to her to be very difficult for [place] to continue to manage her respiratory status if she continues to use cocaine and smokes.  Patient agrees patient reports that she will work to stop using cocaine as well as social work to stop associating with other people who use cocaine.

## 2018-06-22 NOTE — Assessment & Plan Note (Signed)
Please stop using cocaine

## 2018-06-22 NOTE — Assessment & Plan Note (Signed)
Patient to continue Ensure Explained to patient that this is important that she maintains adequate nutrition

## 2018-07-01 ENCOUNTER — Other Ambulatory Visit: Payer: Self-pay | Admitting: Pulmonary Disease
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
## 2018-07-06 ENCOUNTER — Encounter: Payer: Self-pay | Admitting: Pulmonary Disease

## 2018-07-06 ENCOUNTER — Ambulatory Visit (INDEPENDENT_AMBULATORY_CARE_PROVIDER_SITE_OTHER): Payer: Medicaid Other | Admitting: Pulmonary Disease

## 2018-07-06 VITALS — BP 102/66 | HR 91

## 2018-07-06 DIAGNOSIS — F17210 Nicotine dependence, cigarettes, uncomplicated: Secondary | ICD-10-CM | POA: Diagnosis not present

## 2018-07-06 DIAGNOSIS — F1910 Other psychoactive substance abuse, uncomplicated: Secondary | ICD-10-CM | POA: Diagnosis not present

## 2018-07-06 DIAGNOSIS — J449 Chronic obstructive pulmonary disease, unspecified: Secondary | ICD-10-CM

## 2018-07-06 DIAGNOSIS — F17200 Nicotine dependence, unspecified, uncomplicated: Secondary | ICD-10-CM

## 2018-07-06 NOTE — Patient Instructions (Addendum)
Bevespi Aerosphere inhaler >>>2 puffs daily twice a day (4 puffs total daily) >>>This is not a rescue inhaler >>>You take this daily no matter what  Try to obtain Bevespi from adverse form pharmacy, see how much this costs Please drop off the paperwork we provided for you at last office visit to be able to see if you can receive Bevespi at $0 charge  Prednisone 10mg  tablet daily   Primary Care  >>> [facility] is what your current PCP is >>>You need to contact your Medicaid social worker in order to be established with a different primary care provider  Please contact [facility] to schedule a appointment for follow-up   Follow-up in 6 to 8 weeks with Dr. Elsworth Soho or Wyn Quaker NP   We recommend that you stop smoking.  Keep up the hard work with stopping smoking [place]  Smoking Cessation Resources:  1 [place]  >>> Patient to call this resource and utilize it to help support her quit smoking >>> Keep up your hard work with stopping smoking  You can also contact the [facility] >>>For smoking cessation classes call [phone]  We do not recommend using e-cigarettes as a form of stopping smoking     It is flu season:   >>>Remember to be washing your hands regularly, using hand sanitizer, be careful to use around herself with has contact with people who are sick will increase her chances of getting sick yourself. >>> Best ways to protect herself from the flu: Receive the yearly flu vaccine, practice good hand hygiene washing with soap and also using hand sanitizer when available, eat a nutritious meals, get adequate rest, hydrate appropriately    As of 08/10/2018 we will be moving! We will no longer be at our [place] location.   Our new address and phone number will be:  [address] [place], [place] [address] Telephone number: [phone]   Please contact the office if your symptoms worsen or you have concerns that you  are not improving.   Thank you for choosing [facility] for your healthcare, and for allowing [place] to partner with you on your healthcare journey. I am thankful to be able to provide care to you today.   Wyn Quaker FNP-C        Coping with Quitting Smoking Quitting smoking is a physical and mental challenge. You will face cravings, withdrawal symptoms, and temptation. Before quitting, work with your health care provider to make a plan that can help you cope. Preparation can help you quit and keep you from giving in. How can I cope with cravings? Cravings usually last for 5-10 minutes. If you get through it, the craving will pass. Consider taking the following actions to help you cope with cravings:  Keep your mouth busy: ? Chew sugar-free gum. ? Suck on hard candies or a straw. ? Brush your teeth.  Keep your hands and body busy: ? Immediately change to a different activity when you feel a craving. ? Squeeze or play with a ball. ? Do an activity or a hobby, like making bead jewelry, practicing needlepoint, or working with wood. ? Mix up your normal routine. ? Take a short exercise break. Go for a quick walk or run up and down stairs. ? Spend time in public places where smoking is not allowed.  Focus on doing something kind or helpful for someone else.  Call a friend or family member to  talk during a craving.  Join a support group.  Call a quit line, such as 1-800-QUIT-NOW.  Talk with your health care provider about medicines that might help you cope with cravings and make quitting easier for you.  How can I deal with withdrawal symptoms? Your body may experience negative effects as it tries to get used to not having nicotine in the system. These effects are called withdrawal symptoms. They may include:  Feeling hungrier than normal.  Trouble concentrating.  Irritability.  Trouble sleeping.  Feeling depressed.  Restlessness and agitation.  Craving a  cigarette.  To manage withdrawal symptoms:  Avoid places, people, and activities that trigger your cravings.  Remember why you want to quit.  Get plenty of sleep.  Avoid coffee and other caffeinated drinks. These may worsen some of your symptoms.  How can I handle social situations? Social situations can be difficult when you are quitting smoking, especially in the first few weeks. To manage this, you can:  Avoid parties, bars, and other social situations where people might be smoking.  Avoid alcohol.  Leave right away if you have the urge to smoke.  Explain to your family and friends that you are quitting smoking. Ask for understanding and support.  Plan activities with friends or family where smoking is not an option.  What are some ways I can cope with stress? Wanting to smoke may cause stress, and stress can make you want to smoke. Find ways to manage your stress. Relaxation techniques can help. For example:  Breathe slowly and deeply, in through your nose and out through your mouth.  Listen to soothing, relaxing music.  Talk with a family member or friend about your stress.  Light a candle.  Soak in a bath or take a shower.  Think about a peaceful place.  What are some ways I can prevent weight gain? Be aware that many people gain weight after they quit smoking. However, not everyone does. To keep from gaining weight, have a plan in place before you quit and stick to the plan after you quit. Your plan should include:  Having healthy snacks. When you have a craving, it may help to: ? Eat plain popcorn, crunchy carrots, celery, or other cut vegetables. ? Chew sugar-free gum.  Changing how you eat: ? Eat small portion sizes at meals. ? Eat 4-6 small meals throughout the day instead of 1-2 large meals a day. ? Be mindful when you eat. Do not watch television or do other things that might distract you as you eat.  Exercising regularly: ? Make time to exercise each  day. If you do not have time for a long workout, do short bouts of exercise for 5-10 minutes several times a day. ? Do some form of strengthening exercise, like weight lifting, and some form of aerobic exercise, like running or swimming.  Drinking plenty of water or other low-calorie or no-calorie drinks. Drink 6-8 glasses of water daily, or as much as instructed by your health care provider.  Summary  Quitting smoking is a physical and mental challenge. You will face cravings, withdrawal symptoms, and temptation to smoke again. Preparation can help you as you go through these challenges.  You can cope with cravings by keeping your mouth busy (such as by chewing gum), keeping your body and hands busy, and making calls to family, friends, or a helpline for people who want to quit smoking.  You can cope with withdrawal symptoms by avoiding places where people  smoke, avoiding drinks with caffeine, and getting plenty of rest.  Ask your health care provider about the different ways to prevent weight gain, avoid stress, and handle social situations. This information is not intended to replace advice given to you by your health care provider. Make sure you discuss any questions you have with your health care provider. Document Released: 09/20/2016 Document Revised: 09/20/2016 Document Reviewed: 09/20/2016 Elsevier Interactive Patient Education  [employer].

## 2018-07-06 NOTE — Assessment & Plan Note (Signed)
Continue to avoid cocaine and other illicit drugs

## 2018-07-06 NOTE — Assessment & Plan Note (Signed)
  We recommend that you stop smoking.  Keep up the hard work with stopping smoking [place]  Smoking Cessation Resources:  1 [place]  >>> Patient to call this resource and utilize it to help support her quit smoking >>> Keep up your hard work with stopping smoking  You can also contact [facility] >>>For smoking cessation classes call [phone]  We do not recommend using e-cigarettes as a form of stopping smoking

## 2018-07-06 NOTE — Assessment & Plan Note (Addendum)
Bevespi Aerosphere inhaler >>>2 puffs daily twice a day (4 puffs total daily) >>>This is not a rescue inhaler >>>You take this daily no matter what  Try to obtain Bevespi from adverse form pharmacy, see how much this costs Please drop off the paperwork we provided for you at last office visit to be able to see if you can receive Bevespi at $0 charge  Prednisone 10mg  tablet daily   Primary Care  >>> [facility] is what your current PCP is >>>You need to contact your Medicaid social worker in order to be established with a different primary care provider  Please contact [facility] to schedule a appointment for follow-up   Follow-up in 6 to 8 weeks with Dr. Elsworth Soho or Wyn Quaker NP   We recommend that you stop smoking.  Keep up the hard work with stopping smoking [place]  Smoking Cessation Resources:  1 [place]  >>> Patient to call this resource and utilize it to help support her quit smoking >>> Keep up your hard work with stopping smoking  You can also contact the [facility] >>>For smoking cessation classes call [phone]  We do not recommend using e-cigarettes as a form of stopping smoking

## 2018-07-15 ENCOUNTER — Telehealth: Payer: Self-pay | Admitting: Pulmonary Disease

## 2018-07-15 NOTE — Telephone Encounter (Signed)
lmtcb x1 for pt. 

## 2018-07-16 MED ORDER — PREDNISONE 10 MG PO TABS: 10.0000 mg | ORAL_TABLET | Freq: Every day | ORAL | 0 refills | Status: DC

## 2018-07-16 NOTE — Telephone Encounter (Signed)
Spoke with patient. She was seen by Aaron Edelman on 07/06/18. She was advised that Prednisone 10mg  would be called in for her, it was not. Apologized to patient, she verbalized understanding. RX has been sent to [facility]. Nothing further needed at time of call.

## 2018-07-21 ENCOUNTER — Telehealth: Payer: Self-pay | Admitting: Pulmonary Disease

## 2018-07-21 MED ORDER — GLYCOPYRROLATE-FORMOTEROL 9-4.8 MCG/ACT IN AERO: 2.0000 | INHALATION_SPRAY | Freq: Two times a day (BID) | RESPIRATORY_TRACT | 5 refills | Status: DC

## 2018-07-21 NOTE — Telephone Encounter (Signed)
Spoke with pt. She is requesting samples of Bevespi. Advised her that we do not have any samples of Bevespi at this time. Pt asks for a prescription to be sent in. Rx has been sent. Nothing further was needed.

## 2018-07-28 ENCOUNTER — Telehealth: Payer: Self-pay | Admitting: Pulmonary Disease

## 2018-07-28 MED ORDER — GLYCOPYRROLATE-FORMOTEROL 9-4.8 MCG/ACT IN AERO: 2.0000 | INHALATION_SPRAY | Freq: Two times a day (BID) | RESPIRATORY_TRACT | 0 refills | Status: DC

## 2018-07-28 NOTE — Telephone Encounter (Signed)
Pt is calling back [phone]

## 2018-07-28 NOTE — Telephone Encounter (Signed)
Information from pt's last OV with Wyn Quaker, NP 07/06/18 Bevespi Aerosphere inhaler >>>2 puffs daily twice a day (4 puffs total daily) >>>This is not a rescue inhaler >>>You take this daily no matter what  Try to obtain Bevespi from adverse form pharmacy, see how much this costs Please drop off the paperwork we provided for you at last office visit to be able to see if you can receive Bevespi at $0 charge  I have obtained a sample for pt of the Bevespi and have placed it up front for pt. Attempted to contact her to let her know this information but unable to reach her. Left a message for pt to return call x1

## 2018-07-28 NOTE — Telephone Encounter (Signed)
Spoke with patient. Advised her that we do have samples of Bevespi and I have placed a sample up front for her. She verbalized understanding. Nothing further needed at time of call.

## 2018-07-30 ENCOUNTER — Other Ambulatory Visit: Payer: Self-pay | Admitting: Hematology

## 2018-07-30 DIAGNOSIS — C50411 Malignant neoplasm of upper-outer quadrant of right female breast: Secondary | ICD-10-CM

## 2018-08-03 ENCOUNTER — Telehealth: Payer: Self-pay | Admitting: Pulmonary Disease

## 2018-08-03 MED ORDER — ALBUTEROL SULFATE HFA 108 (90 BASE) MCG/ACT IN AERS: 2.0000 | INHALATION_SPRAY | RESPIRATORY_TRACT | 0 refills | Status: DC | PRN

## 2018-08-03 MED ORDER — GLYCOPYRROLATE-FORMOTEROL 9-4.8 MCG/ACT IN AERO: 2.0000 | INHALATION_SPRAY | Freq: Two times a day (BID) | RESPIRATORY_TRACT | 5 refills | Status: DC

## 2018-08-03 NOTE — Telephone Encounter (Signed)
A sample was placed up front for pt for [facility] 07/28/18.  Called and spoke with pt letting her know that the sample had been placed up front for her. Pt stated someone did come pick the sample up for her but she stated she is already out of that sample.  I asked pt if she had been able to fill out the patient assistance paperwork yet that was given to her at last OV and pt stated she had not filled it out.  I stated to pt to try to fill the paperwork out so that way we can be able to see if we could get her help with the payment of the [place]. Pt expressed understanding and asked for me to send an Rx of Bevespi and her rescue inhaler to the pharmacy for her.  I verified pt's pharmacy and have sent a refill of both Bevespi and her rescue inhaler to the pharmacy for pt. Nothing further needed.

## 2018-08-07 ENCOUNTER — Telehealth: Payer: Self-pay | Admitting: Pulmonary Disease

## 2018-08-07 NOTE — Telephone Encounter (Signed)
Called and spoke with pt in regards to the sample of Bevespi that we gave her on 10/22 to see if she was completely out of that sample as it should have lasted her a month.  Pt stated she had been using that sample also as her emergency inhaler as she only had that inhaler. I stated to pt that Rx of a rescue inhaler as well as the [place] had been sent to her pharmacy. Pt stated she had not heard anything from the pharmacy and stated they had not brought her meds to her. I stated to pt she needed to call the pharmacy to see if they owuld bring the meds to her house or if she needed to go pick them up from the pharmacy. Stated to her that we still were needing her to bring the patient assistance paperwork to our office so we could see if we could help her out with cost and pt stated she needed new copies of the paperwork due to her niece ripping it up.  I have reprinted the patient assistance paperwork for pt and have placed it in mail for pt. Nothing further needed.

## 2018-08-12 ENCOUNTER — Other Ambulatory Visit: Payer: Self-pay | Admitting: Pulmonary Disease

## 2018-08-13 ENCOUNTER — Other Ambulatory Visit: Payer: Self-pay | Admitting: Pulmonary Disease

## 2018-08-19 ENCOUNTER — Ambulatory Visit: Payer: Medicaid Other | Admitting: Pulmonary Disease

## 2018-09-11 ENCOUNTER — Inpatient Hospital Stay (HOSPITAL_COMMUNITY)
Admission: EM | Admit: 2018-09-11 | Discharge: 2018-09-20 | DRG: 190 | Disposition: A | Discharge status: Home / Self Care | Payer: Medicaid Other | Attending: Internal Medicine | Admitting: Internal Medicine

## 2018-09-11 ENCOUNTER — Emergency Department (HOSPITAL_COMMUNITY): Payer: Medicaid Other

## 2018-09-11 ENCOUNTER — Encounter (HOSPITAL_COMMUNITY): Payer: Self-pay

## 2018-09-11 ENCOUNTER — Other Ambulatory Visit: Payer: Self-pay

## 2018-09-11 DIAGNOSIS — E039 Hypothyroidism, unspecified: Secondary | ICD-10-CM | POA: Diagnosis present

## 2018-09-11 DIAGNOSIS — F17210 Nicotine dependence, cigarettes, uncomplicated: Secondary | ICD-10-CM | POA: Diagnosis present

## 2018-09-11 DIAGNOSIS — R636 Underweight: Secondary | ICD-10-CM | POA: Diagnosis present

## 2018-09-11 DIAGNOSIS — R55 Syncope and collapse: Secondary | ICD-10-CM | POA: Diagnosis present

## 2018-09-11 DIAGNOSIS — I110 Hypertensive heart disease with heart failure: Secondary | ICD-10-CM | POA: Diagnosis present

## 2018-09-11 DIAGNOSIS — F319 Bipolar disorder, unspecified: Secondary | ICD-10-CM | POA: Diagnosis present

## 2018-09-11 DIAGNOSIS — I5032 Chronic diastolic (congestive) heart failure: Secondary | ICD-10-CM | POA: Diagnosis present

## 2018-09-11 DIAGNOSIS — F1420 Cocaine dependence, uncomplicated: Secondary | ICD-10-CM | POA: Diagnosis present

## 2018-09-11 DIAGNOSIS — E861 Hypovolemia: Secondary | ICD-10-CM | POA: Diagnosis present

## 2018-09-11 DIAGNOSIS — F4310 Post-traumatic stress disorder, unspecified: Secondary | ICD-10-CM | POA: Diagnosis present

## 2018-09-11 DIAGNOSIS — Z79899 Other long term (current) drug therapy: Secondary | ICD-10-CM

## 2018-09-11 DIAGNOSIS — F419 Anxiety disorder, unspecified: Secondary | ICD-10-CM | POA: Diagnosis present

## 2018-09-11 DIAGNOSIS — Z681 Body mass index (BMI) 19 or less, adult: Secondary | ICD-10-CM

## 2018-09-11 DIAGNOSIS — Z9011 Acquired absence of right breast and nipple: Secondary | ICD-10-CM

## 2018-09-11 DIAGNOSIS — J441 Chronic obstructive pulmonary disease with (acute) exacerbation: Secondary | ICD-10-CM

## 2018-09-11 DIAGNOSIS — I951 Orthostatic hypotension: Secondary | ICD-10-CM | POA: Diagnosis present

## 2018-09-11 DIAGNOSIS — J9622 Acute and chronic respiratory failure with hypercapnia: Secondary | ICD-10-CM | POA: Diagnosis present

## 2018-09-11 DIAGNOSIS — E273 Drug-induced adrenocortical insufficiency: Secondary | ICD-10-CM | POA: Diagnosis present

## 2018-09-11 DIAGNOSIS — C50419 Malignant neoplasm of upper-outer quadrant of unspecified female breast: Secondary | ICD-10-CM | POA: Diagnosis present

## 2018-09-11 DIAGNOSIS — J9621 Acute and chronic respiratory failure with hypoxia: Secondary | ICD-10-CM | POA: Diagnosis present

## 2018-09-11 DIAGNOSIS — F1910 Other psychoactive substance abuse, uncomplicated: Secondary | ICD-10-CM | POA: Diagnosis present

## 2018-09-11 DIAGNOSIS — J439 Emphysema, unspecified: Principal | ICD-10-CM | POA: Diagnosis present

## 2018-09-11 DIAGNOSIS — R569 Unspecified convulsions: Secondary | ICD-10-CM | POA: Diagnosis present

## 2018-09-11 DIAGNOSIS — Z9981 Dependence on supplemental oxygen: Secondary | ICD-10-CM

## 2018-09-11 DIAGNOSIS — F209 Schizophrenia, unspecified: Secondary | ICD-10-CM | POA: Diagnosis present

## 2018-09-11 DIAGNOSIS — L89152 Pressure ulcer of sacral region, stage 2: Secondary | ICD-10-CM | POA: Diagnosis present

## 2018-09-11 DIAGNOSIS — J45909 Unspecified asthma, uncomplicated: Secondary | ICD-10-CM | POA: Diagnosis present

## 2018-09-11 DIAGNOSIS — Z79811 Long term (current) use of aromatase inhibitors: Secondary | ICD-10-CM

## 2018-09-11 DIAGNOSIS — Z7951 Long term (current) use of inhaled steroids: Secondary | ICD-10-CM

## 2018-09-11 DIAGNOSIS — T380X5A Adverse effect of glucocorticoids and synthetic analogues, initial encounter: Secondary | ICD-10-CM | POA: Diagnosis present

## 2018-09-11 DIAGNOSIS — I493 Ventricular premature depolarization: Secondary | ICD-10-CM | POA: Diagnosis present

## 2018-09-11 DIAGNOSIS — Z7952 Long term (current) use of systemic steroids: Secondary | ICD-10-CM

## 2018-09-11 DIAGNOSIS — Z79890 Hormone replacement therapy (postmenopausal): Secondary | ICD-10-CM

## 2018-09-11 DIAGNOSIS — E43 Unspecified severe protein-calorie malnutrition: Secondary | ICD-10-CM | POA: Diagnosis present

## 2018-09-11 DIAGNOSIS — Z881 Allergy status to other antibiotic agents status: Secondary | ICD-10-CM

## 2018-09-11 LAB — CBC WITH DIFFERENTIAL/PLATELET
Abs Immature Granulocytes: 0.02 10*3/uL (ref 0.00–0.07)
Basophils Absolute: 0 10*3/uL (ref 0.0–0.1)
Basophils Relative: 1 %
Eosinophils Absolute: 0 10*3/uL (ref 0.0–0.5)
Eosinophils Relative: 0 %
HCT: 45.8 % (ref 36.0–46.0)
Hemoglobin: 12.5 g/dL (ref 12.0–15.0)
Immature Granulocytes: 0 %
Lymphocytes Relative: 17 %
Lymphs Abs: 1.4 10*3/uL (ref 0.7–4.0)
MCH: 26.6 pg (ref 26.0–34.0)
MCHC: 27.3 g/dL — ABNORMAL LOW (ref 30.0–36.0)
MCV: 97.4 fL (ref 80.0–100.0)
MONO ABS: 0.6 10*3/uL (ref 0.1–1.0)
Monocytes Relative: 7 %
Neutro Abs: 6.2 10*3/uL (ref 1.7–7.7)
Neutrophils Relative %: 75 %
Platelets: 191 10*3/uL (ref 150–400)
RBC: 4.7 MIL/uL (ref 3.87–5.11)
RDW: 13.9 % (ref 11.5–15.5)
WBC: 8.3 10*3/uL (ref 4.0–10.5)
nRBC: 0 % (ref 0.0–0.2)

## 2018-09-11 LAB — I-STAT VENOUS BLOOD GAS, ED
Acid-Base Excess: 21 mmol/L — ABNORMAL HIGH (ref 0.0–2.0)
Bicarbonate: 52.5 mmol/L — ABNORMAL HIGH (ref 20.0–28.0)
O2 Saturation: 46 %
pCO2, Ven: 94.3 mmHg (ref 44.0–60.0)
pH, Ven: 7.353 (ref 7.250–7.430)
pO2, Ven: 29 mmHg — CL (ref 32.0–45.0)

## 2018-09-11 LAB — I-STAT TROPONIN, ED: Troponin i, poc: 0 ng/mL (ref 0.00–0.08)

## 2018-09-11 MED ORDER — METHYLPREDNISOLONE SODIUM SUCC 125 MG IJ SOLR
125.0000 mg | Freq: Once | INTRAMUSCULAR | Status: AC
  Administered 2018-09-11: 125 mg via INTRAVENOUS
  Filled 2018-09-11: qty 2

## 2018-09-11 MED ORDER — ALBUTEROL (5 MG/ML) CONTINUOUS INHALATION SOLN
10.0000 mg/h | INHALATION_SOLUTION | RESPIRATORY_TRACT | Status: DC
  Administered 2018-09-12: 10 mg/h via RESPIRATORY_TRACT
  Filled 2018-09-11: qty 20

## 2018-09-11 NOTE — ED Notes (Signed)
Recollect blood for istat chem 8

## 2018-09-11 NOTE — ED Triage Notes (Signed)
Pt brought in by [facility] from home for SOB that started yesterday. Pt states she has been using her home inhalers without relief. Pt states she wears 3-4L O2 at home at all times. Per EMS pt lung sounds diminished on the left.

## 2018-09-11 NOTE — ED Notes (Signed)
Delay in lab draw,  Pt not in room.  RN will collect blood when pt returns.
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
## 2018-09-12 ENCOUNTER — Encounter (HOSPITAL_COMMUNITY): Payer: Self-pay | Admitting: Family Medicine

## 2018-09-12 DIAGNOSIS — J441 Chronic obstructive pulmonary disease with (acute) exacerbation: Secondary | ICD-10-CM | POA: Diagnosis present

## 2018-09-12 DIAGNOSIS — Z170 Estrogen receptor positive status [ER+]: Secondary | ICD-10-CM

## 2018-09-12 DIAGNOSIS — C50411 Malignant neoplasm of upper-outer quadrant of right female breast: Secondary | ICD-10-CM

## 2018-09-12 DIAGNOSIS — E039 Hypothyroidism, unspecified: Secondary | ICD-10-CM

## 2018-09-12 DIAGNOSIS — F319 Bipolar disorder, unspecified: Secondary | ICD-10-CM | POA: Diagnosis not present

## 2018-09-12 DIAGNOSIS — R55 Syncope and collapse: Secondary | ICD-10-CM | POA: Diagnosis present

## 2018-09-12 DIAGNOSIS — J9622 Acute and chronic respiratory failure with hypercapnia: Secondary | ICD-10-CM | POA: Diagnosis not present

## 2018-09-12 DIAGNOSIS — F1910 Other psychoactive substance abuse, uncomplicated: Secondary | ICD-10-CM

## 2018-09-12 LAB — BASIC METABOLIC PANEL
Anion gap: 16 — ABNORMAL HIGH (ref 5–15)
BUN: 14 mg/dL (ref 6–20)
CALCIUM: 9.2 mg/dL (ref 8.9–10.3)
CO2: 38 mmol/L — ABNORMAL HIGH (ref 22–32)
Chloride: 89 mmol/L — ABNORMAL LOW (ref 98–111)
Creatinine, Ser: 0.64 mg/dL (ref 0.44–1.00)
GFR calc Af Amer: >60 mL/min (ref >60)
GFR calc non Af Amer: >60 mL/min (ref >60)
Glucose, Bld: 106 mg/dL — ABNORMAL HIGH (ref 70–99)
POTASSIUM: 4.1 mmol/L (ref 3.5–5.1)
Sodium: 143 mmol/L (ref 135–145)

## 2018-09-12 LAB — TROPONIN I: Troponin I: <0.03 ng/mL (ref <0.03)

## 2018-09-12 LAB — RAPID URINE DRUG SCREEN, HOSP PERFORMED
Amphetamines: NOT DETECTED
BENZODIAZEPINES: NOT DETECTED
Barbiturates: NOT DETECTED
Cocaine: POSITIVE — AB
Opiates: NOT DETECTED
Tetrahydrocannabinol: NOT DETECTED

## 2018-09-12 LAB — MRSA PCR SCREENING: MRSA by PCR: NEGATIVE

## 2018-09-12 MED ORDER — ACETAMINOPHEN 650 MG RE SUPP: 650.0000 mg | Freq: Four times a day (QID) | RECTAL | Status: DC | PRN

## 2018-09-12 MED ORDER — OLANZAPINE 5 MG PO TBDP
20.0000 mg | ORAL_TABLET | Freq: Every day | ORAL | Status: DC
  Administered 2018-09-12 – 2018-09-15 (×4): 20 mg via ORAL
  Filled 2018-09-12 (×4): qty 4

## 2018-09-12 MED ORDER — ALBUTEROL SULFATE (2.5 MG/3ML) 0.083% IN NEBU: 2.5000 mg | INHALATION_SOLUTION | RESPIRATORY_TRACT | Status: DC | PRN

## 2018-09-12 MED ORDER — SODIUM CHLORIDE 0.9% FLUSH: 3.0000 mL | INTRAVENOUS | Status: DC | PRN

## 2018-09-12 MED ORDER — FAMOTIDINE 20 MG PO TABS
20.0000 mg | ORAL_TABLET | Freq: Every day | ORAL | Status: DC
  Administered 2018-09-12 – 2018-09-19 (×8): 20 mg via ORAL
  Filled 2018-09-12 (×8): qty 1

## 2018-09-12 MED ORDER — METHYLPREDNISOLONE SODIUM SUCC 125 MG IJ SOLR
60.0000 mg | Freq: Three times a day (TID) | INTRAMUSCULAR | Status: DC
  Administered 2018-09-12 – 2018-09-13 (×2): 60 mg via INTRAVENOUS
  Filled 2018-09-12 (×3): qty 2

## 2018-09-12 MED ORDER — SODIUM CHLORIDE 0.9% FLUSH
3.0000 mL | Freq: Two times a day (BID) | INTRAVENOUS | Status: DC
  Administered 2018-09-12 – 2018-09-19 (×13): 3 mL via INTRAVENOUS

## 2018-09-12 MED ORDER — SODIUM CHLORIDE 0.9 % IV SOLN
INTRAVENOUS | Status: DC
  Administered 2018-09-12 (×2): via INTRAVENOUS

## 2018-09-12 MED ORDER — ONDANSETRON HCL 4 MG/2ML IJ SOLN: 4.0000 mg | Freq: Four times a day (QID) | INTRAMUSCULAR | Status: DC | PRN

## 2018-09-12 MED ORDER — SODIUM CHLORIDE 0.9% FLUSH
3.0000 mL | Freq: Two times a day (BID) | INTRAVENOUS | Status: DC
  Administered 2018-09-12: 3 mL via INTRAVENOUS

## 2018-09-12 MED ORDER — SENNOSIDES-DOCUSATE SODIUM 8.6-50 MG PO TABS: 1.0000 | ORAL_TABLET | Freq: Every evening | ORAL | Status: DC | PRN

## 2018-09-12 MED ORDER — PANTOPRAZOLE SODIUM 40 MG PO TBEC
40.0000 mg | DELAYED_RELEASE_TABLET | Freq: Every day | ORAL | Status: DC
  Administered 2018-09-12 – 2018-09-19 (×8): 40 mg via ORAL
  Filled 2018-09-12 (×8): qty 1

## 2018-09-12 MED ORDER — DIVALPROEX SODIUM 500 MG PO DR TAB
500.0000 mg | DELAYED_RELEASE_TABLET | Freq: Two times a day (BID) | ORAL | Status: DC
  Administered 2018-09-17: 500 mg via ORAL
  Filled 2018-09-12 (×9): qty 1
  Filled 2018-09-12: qty 2
  Filled 2018-09-12 (×3): qty 1

## 2018-09-12 MED ORDER — DIPHENHYDRAMINE HCL 25 MG PO CAPS
50.0000 mg | ORAL_CAPSULE | Freq: Every evening | ORAL | Status: DC | PRN
  Filled 2018-09-12: qty 2

## 2018-09-12 MED ORDER — ARFORMOTEROL TARTRATE 15 MCG/2ML IN NEBU
15.0000 ug | INHALATION_SOLUTION | Freq: Two times a day (BID) | RESPIRATORY_TRACT | Status: DC
  Administered 2018-09-12 – 2018-09-19 (×15): 15 ug via RESPIRATORY_TRACT
  Filled 2018-09-12 (×19): qty 2

## 2018-09-12 MED ORDER — ACETAMINOPHEN 325 MG PO TABS
650.0000 mg | ORAL_TABLET | Freq: Four times a day (QID) | ORAL | Status: DC | PRN
  Administered 2018-09-14: 650 mg via ORAL
  Filled 2018-09-12: qty 2

## 2018-09-12 MED ORDER — AZITHROMYCIN 250 MG PO TABS
500.0000 mg | ORAL_TABLET | Freq: Every day | ORAL | Status: AC
  Administered 2018-09-13 – 2018-09-18 (×6): 500 mg via ORAL
  Filled 2018-09-12 (×6): qty 2

## 2018-09-12 MED ORDER — ENOXAPARIN SODIUM 30 MG/0.3ML ~~LOC~~ SOLN
30.0000 mg | Freq: Every day | SUBCUTANEOUS | Status: DC
  Administered 2018-09-12 – 2018-09-18 (×7): 30 mg via SUBCUTANEOUS
  Filled 2018-09-12 (×9): qty 0.3

## 2018-09-12 MED ORDER — ANASTROZOLE 1 MG PO TABS
1.0000 mg | ORAL_TABLET | Freq: Every day | ORAL | Status: DC
  Administered 2018-09-12 – 2018-09-20 (×9): 1 mg via ORAL
  Filled 2018-09-12 (×10): qty 1

## 2018-09-12 MED ORDER — SODIUM CHLORIDE 0.9 % IV SOLN
500.0000 mg | Freq: Every day | INTRAVENOUS | Status: DC
  Administered 2018-09-12: 500 mg via INTRAVENOUS
  Filled 2018-09-12: qty 500

## 2018-09-12 MED ORDER — LEVOTHYROXINE SODIUM 75 MCG PO TABS
75.0000 ug | ORAL_TABLET | Freq: Every day | ORAL | Status: DC
  Administered 2018-09-12 – 2018-09-19 (×8): 75 ug via ORAL
  Filled 2018-09-12 (×9): qty 1

## 2018-09-12 MED ORDER — SODIUM CHLORIDE 0.9 % IV BOLUS
250.0000 mL | Freq: Once | INTRAVENOUS | Status: AC
  Administered 2018-09-12: 250 mL via INTRAVENOUS

## 2018-09-12 MED ORDER — SODIUM CHLORIDE 0.9 % IV SOLN: 250.0000 mL | INTRAVENOUS | Status: DC | PRN

## 2018-09-12 MED ORDER — UMECLIDINIUM BROMIDE 62.5 MCG/INH IN AEPB
1.0000 | INHALATION_SPRAY | Freq: Every day | RESPIRATORY_TRACT | Status: DC
  Administered 2018-09-12 – 2018-09-20 (×8): 1 via RESPIRATORY_TRACT
  Filled 2018-09-12 (×3): qty 7

## 2018-09-12 MED ORDER — FLUOXETINE HCL 20 MG PO CAPS
40.0000 mg | ORAL_CAPSULE | Freq: Every day | ORAL | Status: DC
  Administered 2018-09-15 – 2018-09-20 (×4): 40 mg via ORAL
  Filled 2018-09-12 (×10): qty 2

## 2018-09-12 MED ORDER — ONDANSETRON HCL 4 MG PO TABS: 4.0000 mg | ORAL_TABLET | Freq: Four times a day (QID) | ORAL | Status: DC | PRN

## 2018-09-12 MED ORDER — METHYLPREDNISOLONE SODIUM SUCC 125 MG IJ SOLR
60.0000 mg | Freq: Four times a day (QID) | INTRAMUSCULAR | Status: DC
  Administered 2018-09-12 (×2): 60 mg via INTRAVENOUS
  Filled 2018-09-12 (×2): qty 2

## 2018-09-12 MED ORDER — ENSURE ENLIVE PO LIQD
237.0000 mL | Freq: Three times a day (TID) | ORAL | Status: DC
  Administered 2018-09-12 – 2018-09-14 (×6): 237 mL via ORAL
  Filled 2018-09-12: qty 237

## 2018-09-12 MED ORDER — ALBUTEROL SULFATE (2.5 MG/3ML) 0.083% IN NEBU
2.5000 mg | INHALATION_SOLUTION | RESPIRATORY_TRACT | Status: DC | PRN
  Administered 2018-09-12: 2.5 mg via RESPIRATORY_TRACT
  Filled 2018-09-12: qty 3

## 2018-09-12 MED ORDER — ACETAMINOPHEN 325 MG PO TABS: 650.0000 mg | ORAL_TABLET | Freq: Four times a day (QID) | ORAL | Status: DC | PRN

## 2018-09-12 MED ORDER — SODIUM CHLORIDE 0.9 % IV SOLN
INTRAVENOUS | Status: DC
  Administered 2018-09-12 – 2018-09-13 (×2): via INTRAVENOUS

## 2018-09-12 NOTE — Progress Notes (Signed)
CSW received consult regarding pt's substance use. CSW met with patient who had a visitor present. CSW informed pt he had to ask her several private questions and asked him to leave if pt wanted to answer privately. CSW notes patient declined and shook her head in confirmation of consent for him to stay present.  CSW completed SBIRT with the patient and noted concerns. Pt would only shake head yes or no when the visitor answered for her and did not verbally communicate with the CSW throughout the assessment. Noting these communication barriers, CSW did provide patient with resources on substance abuse programs in the area including SAIOPs, residential resources, food resources.   CSW is signing off.  Lamonte Richer, LCSW, [place] Worker II [phone]
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
## 2018-09-12 NOTE — Progress Notes (Signed)
09/12/2018 Patient came from the emergency room to 2W at 1600. She is alert, oriented  and ambulatory with standby assist. Patient was place on stepdown monitor, receive CHG bath and MRSA swab. Patient skin intact, feet dry and flaky. [facility] RN.

## 2018-09-12 NOTE — Progress Notes (Signed)
[facility] TEAM 1 - Stepdown/ICU TEAM  KEIGHLEY DECKMAN  HQI:[id number] DOB: 10/07/1961 DOA: 09/11/2018 PCP: [name], MD    Brief Narrative:  57 y.o. F w/ a hx of bipolar disorder, cocaine abuse, breast cancer status post mastectomy, hypothyroidism, chronic diastolic CHF, and steroid-dependent COPD with chronic hypoxic and hypercarbic respiratory failure who presented to the ED w/ shortness of breath.  Patient reported she had been smoking cocaine, and wants to quit and asked for help.   In the ED a CXR was notable for emphysema, but no acute findings.  Exam was suggestive of an acute exacerbation of COPD.  Significant Events: 12/7 admit   Subjective: Pt is seen for a f/u visit.   Assessment & Plan:  COPD exacerbation; acute on chronic hypercarbic & hypoxic respiratory failure  Slowly improving w/ medical tx   Syncope  had a syncopal episode the day prior to admission in setting of coughing fit and without chest pain or palpitations - likely reflex syncope in this setting - hypovolemia also likely contributing   Bipolar disorder  denies SI, HI, or hallucinations - continue Depakote, Prozac, Zyprexa    Substance abuse  ongoing cocaine use (smokes cocaine) - states she wants to quit - social work consulted   Breast cancer  Status-post right mastectomy - continue Arimidex    Hypothyroidism  Continue Synthroid   Hypertension  Hold Norvasc and diuretics - cont to hydrate   Chronic diastolic CHF  hypovolemic on admission    Abnormal CT head  Head CT was obtained in ED after she reported a syncopal episodes the day prior to admit - this noted an area of hypodensity in left cerebellar hemisphere that is likely artifact from patient motion, but repeat was recommended when she is able to remain still    DVT prophylaxis: lovenox  Code Status: FULL CODE Family Communication: spoke w/ family at bedside   Disposition Plan: stable for tele bed  Consultants:    None  Antimicrobials:  none  Objective: Blood pressure 107/70, pulse 96, temperature 98.4 F (36.9 C), temperature source Oral, resp. rate 13, height 5\' 10"  (1.778 m), weight 40.8 kg, SpO2 100 %.  Intake/Output Summary (Last 24 hours) at 09/12/2018 1553 Last data filed at 09/12/2018 0931 Gross per 24 hour  Intake 500 ml  Output -  Net 500 ml   Filed Weights   09/11/18 2212  Weight: 40.8 kg    Examination: Pt is seen for a f/u exam  CBC: Recent Labs  Lab 09/11/18 2230  WBC 8.3  NEUTROABS 6.2  HGB 12.5  HCT 45.8  MCV 97.4  PLT 132   Basic Metabolic Panel: Recent Labs  Lab 09/12/18 0236  NA 143  K 4.1  CL 89*  CO2 38*  GLUCOSE 106*  BUN 14  CREATININE 0.64  CALCIUM 9.2   GFR: Estimated Creatinine Clearance: 50.6 mL/min (by C-G formula based on SCr of 0.64 mg/dL).  Liver Function Tests: No results for input(s): AST, ALT, ALKPHOS, BILITOT, PROT, ALBUMIN in the last 168 hours. No results for input(s): LIPASE, AMYLASE in the last 168 hours. No results for input(s): AMMONIA in the last 168 hours.  Coagulation Profile: No results for input(s): INR, PROTIME in the last 168 hours.  Cardiac Enzymes: Recent Labs  Lab 09/12/18 0236  TROPONINI <0.03    HbA1C: Hgb A1c MFr Bld  Date/Time Value Ref Range Status  02/26/2016 04:15 PM 6.2 (H) 4.8 - 5.6 % Final    Comment:    (  NOTE)         Pre-diabetes: 5.7 - 6.4         Diabetes: >6.4         Glycemic control for adults with diabetes: <7.0      Scheduled Meds: . anastrozole  1 mg Oral Daily  . arformoterol  15 mcg Nebulization BID  . [START ON 09/13/2018] azithromycin  500 mg Oral Daily  . divalproex  500 mg Oral BID  . enoxaparin (LOVENOX) injection  30 mg Subcutaneous Daily  . famotidine  20 mg Oral QHS  . feeding supplement (ENSURE ENLIVE)  237 mL Oral TID BM  . FLUoxetine  40 mg Oral Daily  . levothyroxine  75 mcg Oral QAC breakfast  . methylPREDNISolone (SOLU-MEDROL) injection  60 mg  Intravenous Q6H  . OLANZapine zydis  20 mg Oral QHS  . pantoprazole  40 mg Oral Daily  . sodium chloride flush  3 mL Intravenous Q12H  . sodium chloride flush  3 mL Intravenous Q12H  . umeclidinium bromide  1 puff Inhalation Daily     LOS: 0 days   Cherene Altes, MD [facility] Office  [phone] Pager - Text Page per Shea Evans  If 7PM-7AM, please contact night-coverage per Amion 09/12/2018, 3:53 PM

## 2018-09-13 DIAGNOSIS — J9622 Acute and chronic respiratory failure with hypercapnia: Secondary | ICD-10-CM | POA: Diagnosis present

## 2018-09-13 DIAGNOSIS — Z881 Allergy status to other antibiotic agents status: Secondary | ICD-10-CM | POA: Diagnosis not present

## 2018-09-13 DIAGNOSIS — F4310 Post-traumatic stress disorder, unspecified: Secondary | ICD-10-CM | POA: Diagnosis present

## 2018-09-13 DIAGNOSIS — I110 Hypertensive heart disease with heart failure: Secondary | ICD-10-CM | POA: Diagnosis present

## 2018-09-13 DIAGNOSIS — R636 Underweight: Secondary | ICD-10-CM | POA: Diagnosis present

## 2018-09-13 DIAGNOSIS — Z79811 Long term (current) use of aromatase inhibitors: Secondary | ICD-10-CM | POA: Diagnosis not present

## 2018-09-13 DIAGNOSIS — F17210 Nicotine dependence, cigarettes, uncomplicated: Secondary | ICD-10-CM | POA: Diagnosis present

## 2018-09-13 DIAGNOSIS — Z79890 Hormone replacement therapy (postmenopausal): Secondary | ICD-10-CM | POA: Diagnosis not present

## 2018-09-13 DIAGNOSIS — Z681 Body mass index (BMI) 19 or less, adult: Secondary | ICD-10-CM | POA: Diagnosis not present

## 2018-09-13 DIAGNOSIS — E43 Unspecified severe protein-calorie malnutrition: Secondary | ICD-10-CM | POA: Diagnosis present

## 2018-09-13 DIAGNOSIS — I951 Orthostatic hypotension: Secondary | ICD-10-CM | POA: Diagnosis present

## 2018-09-13 DIAGNOSIS — J9621 Acute and chronic respiratory failure with hypoxia: Secondary | ICD-10-CM | POA: Diagnosis present

## 2018-09-13 DIAGNOSIS — E039 Hypothyroidism, unspecified: Secondary | ICD-10-CM | POA: Diagnosis present

## 2018-09-13 DIAGNOSIS — R42 Dizziness and giddiness: Secondary | ICD-10-CM

## 2018-09-13 DIAGNOSIS — J45909 Unspecified asthma, uncomplicated: Secondary | ICD-10-CM | POA: Diagnosis present

## 2018-09-13 DIAGNOSIS — E273 Drug-induced adrenocortical insufficiency: Secondary | ICD-10-CM | POA: Diagnosis present

## 2018-09-13 DIAGNOSIS — F1420 Cocaine dependence, uncomplicated: Secondary | ICD-10-CM | POA: Diagnosis present

## 2018-09-13 DIAGNOSIS — J441 Chronic obstructive pulmonary disease with (acute) exacerbation: Secondary | ICD-10-CM | POA: Diagnosis not present

## 2018-09-13 DIAGNOSIS — I5032 Chronic diastolic (congestive) heart failure: Secondary | ICD-10-CM | POA: Diagnosis present

## 2018-09-13 DIAGNOSIS — F419 Anxiety disorder, unspecified: Secondary | ICD-10-CM | POA: Diagnosis present

## 2018-09-13 DIAGNOSIS — L89152 Pressure ulcer of sacral region, stage 2: Secondary | ICD-10-CM | POA: Diagnosis present

## 2018-09-13 DIAGNOSIS — Z9981 Dependence on supplemental oxygen: Secondary | ICD-10-CM | POA: Diagnosis not present

## 2018-09-13 DIAGNOSIS — J439 Emphysema, unspecified: Secondary | ICD-10-CM | POA: Diagnosis present

## 2018-09-13 DIAGNOSIS — F319 Bipolar disorder, unspecified: Secondary | ICD-10-CM | POA: Diagnosis present

## 2018-09-13 DIAGNOSIS — Z7952 Long term (current) use of systemic steroids: Secondary | ICD-10-CM | POA: Diagnosis not present

## 2018-09-13 DIAGNOSIS — I493 Ventricular premature depolarization: Secondary | ICD-10-CM | POA: Diagnosis present

## 2018-09-13 DIAGNOSIS — E861 Hypovolemia: Secondary | ICD-10-CM | POA: Diagnosis present

## 2018-09-13 LAB — BASIC METABOLIC PANEL
Anion gap: 10 (ref 5–15)
BUN: 15 mg/dL (ref 6–20)
CO2: 38 mmol/L — ABNORMAL HIGH (ref 22–32)
Calcium: 8.7 mg/dL — ABNORMAL LOW (ref 8.9–10.3)
Chloride: 97 mmol/L — ABNORMAL LOW (ref 98–111)
Creatinine, Ser: 0.43 mg/dL — ABNORMAL LOW (ref 0.44–1.00)
Glucose, Bld: 102 mg/dL — ABNORMAL HIGH (ref 70–99)
Potassium: 4.3 mmol/L (ref 3.5–5.1)
Sodium: 145 mmol/L (ref 135–145)

## 2018-09-13 LAB — CBC
HCT: 37.6 % (ref 36.0–46.0)
Hemoglobin: 11.1 g/dL — ABNORMAL LOW (ref 12.0–15.0)
MCH: 27.4 pg (ref 26.0–34.0)
MCHC: 29.5 g/dL — ABNORMAL LOW (ref 30.0–36.0)
MCV: 92.8 fL (ref 80.0–100.0)
Platelets: 179 10*3/uL (ref 150–400)
RBC: 4.05 MIL/uL (ref 3.87–5.11)
RDW: 14.4 % (ref 11.5–15.5)
WBC: 10 10*3/uL (ref 4.0–10.5)
nRBC: 0 % (ref 0.0–0.2)

## 2018-09-13 LAB — MAGNESIUM: Magnesium: 1.8 mg/dL (ref 1.7–2.4)

## 2018-09-13 MED ORDER — SODIUM CHLORIDE 0.9 % IV BOLUS
500.0000 mL | Freq: Once | INTRAVENOUS | Status: AC
  Administered 2018-09-13: 500 mL via INTRAVENOUS

## 2018-09-13 MED ORDER — METHYLPREDNISOLONE SODIUM SUCC 40 MG IJ SOLR
40.0000 mg | Freq: Three times a day (TID) | INTRAMUSCULAR | Status: DC
  Administered 2018-09-13 – 2018-09-15 (×6): 40 mg via INTRAVENOUS
  Filled 2018-09-13 (×6): qty 1

## 2018-09-13 NOTE — Progress Notes (Signed)
[facility] PROGRESS NOTE  Courtney Terry MWN:[id number] DOB: [birth year] DOA: 09/11/2018 PCP: [name], MD  Assessment/Plan:  COPD exacerbation; acute on chronic hypercarbic & hypoxic respiratory failure BS remain quite distant. She wears oxygen 3L at home and is on chronic steroids. Not sure of compliance.  Only slight wheeze.  -continue solumedrol -flutter valve -nebulizer  Syncope had a syncopal episode the day prior to admission in setting of coughing fit and without chest pain or palpitations- likely reflex syncope in this setting - hypovolemia also likely contributing -no further episodes  Bipolar disorder denies SI, HI, or hallucinations- continue Depakote, Prozac, Zyprexa. She is somewhat unengaging with interview and exam  Substance abuse ongoing cocaine use (smokes cocaine)- states she wants to quit - social work consulted. UDS +cocaine -monitor for withdrawal  Breast cancer Status-post right mastectomy- continue Arimidex  Hypothyroidism Continue Synthroid  Hypertension BP soft. Holding Norvasc and diuretics. Bolus 500cc NS  Monitor closely  Chronic diastolic CHF hypovolemic on admission Echo 11/2017 ef 40% grade 1 diastolic dysfunction.  Daily weight   Abnormal CT head  Head CT was obtained in ED after she reported a syncopal episodes the day prior to admit - this noted an area of hypodensity in left cerebellar hemisphere that is likely artifact from patient motion, but repeat was recommended when she is able to remain still   Underweight -related to above -nutritional consult   Code Status: full Family Communication: friend at bedside Disposition Plan: home   Consultants:  none  Procedures:    Antibiotics:  zithromax   HPI/Subjective: Admitted 12/7 with acute on chronic respiratory failure with hypoxia hypercarbia related to copd exacerbation triggered by smoking cocaine.   Initially on bipap and  hypotensive.   Objective: Vitals:   09/13/18 0754 09/13/18 0859  BP:  100/75  Pulse:  83  Resp:  17  Temp:  (!) 97.4 F (36.3 C)  SpO2: 91% 98%   No intake or output data in the 24 hours ending 09/13/18 1604 Filed Weights   09/11/18 2212 09/13/18 0600  Weight: 40.8 kg 43.6 kg    Exam:   General:  Quite thin frail chronically ill appearing. Eyes closed but responds to verbal stimuli temporal wasting  Cardiovascular: rrr no mgr no LE edema  Respiratory: mild increased work of breathing at rest. Using accessory muscles at clavicle/sternum. BS quite distant. Only faint end expiratory wheeze on occaison. No crackles  Abdomen: flat soft +BS no guarding or rebounding  Musculoskeletal: joints without swelling erythema   Data Reviewed: Basic Metabolic Panel: Recent Labs  Lab 09/12/18 0236 09/13/18 0641  NA 143 145  K 4.1 4.3  CL 89* 97*  CO2 38* 38*  GLUCOSE 106* 102*  BUN 14 15  CREATININE 0.64 0.43*  CALCIUM 9.2 8.7*  MG  --  1.8   Liver Function Tests: No results for input(s): AST, ALT, ALKPHOS, BILITOT, PROT, ALBUMIN in the last 168 hours. No results for input(s): LIPASE, AMYLASE in the last 168 hours. No results for input(s): AMMONIA in the last 168 hours. CBC: Recent Labs  Lab 09/11/18 2230 09/13/18 0641  WBC 8.3 10.0  NEUTROABS 6.2  --   HGB 12.5 11.1*  HCT 45.8 37.6  MCV 97.4 92.8  PLT 191 179   Cardiac Enzymes: Recent Labs  Lab 09/12/18 0236  TROPONINI <0.03   BNP (last 3 results) Recent Labs    11/11/17 0911 01/26/18 0458 06/08/18 1413  BNP 1,048.2* 958.4* 35.0    ProBNP (last  3 results) No results for input(s): PROBNP in the last 8760 hours.  CBG: No results for input(s): GLUCAP in the last 168 hours.  Recent Results (from the past 240 hour(s))  MRSA PCR Screening     Status: None   Collection Time: 09/12/18  3:26 PM  Result Value Ref Range Status   MRSA by PCR NEGATIVE NEGATIVE Final    Comment:        The GeneXpert MRSA  Assay (FDA approved for NASAL specimens only), is one component of a comprehensive MRSA colonization surveillance program. It is not intended to diagnose MRSA infection nor to guide or monitor treatment for MRSA infections. Performed at [facility], [place] [address], [place], [place] [address]      Studies: Dg Chest 2 View  Result Date: 09/11/2018 CLINICAL DATA:  57 year old female with shortness of breath and chest pain. EXAM: CHEST - 2 VIEW COMPARISON:  Chest radiograph dated 06/09/2018 FINDINGS: Emphysema and hyperexpansion of the lungs. There is no focal consolidation, pleural effusion or pneumothorax. The cardiac silhouette is within normal limits. Right axillary and right chest wall surgical clips. No acute osseous pathology. IMPRESSION: 1. No acute cardiopulmonary process. 2. Emphysema. Electronically Signed   By: Anner Crete M.D.   On: 09/11/2018 23:40   Ct Head Wo Contrast  Result Date: 09/11/2018 CLINICAL DATA:  Poly trauma EXAM: CT HEAD WITHOUT CONTRAST TECHNIQUE: Contiguous axial images were obtained from the base of the skull through the vertex without intravenous contrast. COMPARISON:  None. FINDINGS: Brain: Streak artifacts limit assessment at the skull base. Areas of hypodensity are noted involving the left cerebellar hemisphere more likely to represent artifact motion. Underlying nonhemorrhagic infarct is not entirely excluded but believed less likely. Short-term interval follow-up is recommended therefore. Vascular: No hyperdense vessels. Skull: Intact Sinuses/Orbits: Nonacute Other: None IMPRESSION: Areas of hypodensity involving the left cerebellar hemisphere more likely to represent artifact are identified given patient motion. Underlying nonhemorrhagic infarct is not entirely excluded but believed less likely. Short-term interval follow-up is recommended. Otherwise negative. Electronically Signed   By: Ashley Royalty M.D.   On: 09/11/2018 23:41    Scheduled  Meds: . anastrozole  1 mg Oral Daily  . arformoterol  15 mcg Nebulization BID  . azithromycin  500 mg Oral Daily  . divalproex  500 mg Oral BID  . enoxaparin (LOVENOX) injection  30 mg Subcutaneous Daily  . famotidine  20 mg Oral QHS  . feeding supplement (ENSURE ENLIVE)  237 mL Oral TID BM  . FLUoxetine  40 mg Oral Daily  . levothyroxine  75 mcg Oral QAC breakfast  . methylPREDNISolone (SOLU-MEDROL) injection  40 mg Intravenous Q8H  . OLANZapine zydis  20 mg Oral QHS  . pantoprazole  40 mg Oral Daily  . sodium chloride flush  3 mL Intravenous Q12H  . umeclidinium bromide  1 puff Inhalation Daily   Continuous Infusions: . 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

## 2018-09-14 ENCOUNTER — Inpatient Hospital Stay (HOSPITAL_COMMUNITY): Payer: Medicaid Other

## 2018-09-14 MED ORDER — ENSURE ENLIVE PO LIQD
237.0000 mL | Freq: Three times a day (TID) | ORAL | Status: DC
  Administered 2018-09-14 – 2018-09-19 (×14): 237 mL via ORAL

## 2018-09-14 MED ORDER — ADULT MULTIVITAMIN W/MINERALS CH
1.0000 | ORAL_TABLET | Freq: Every day | ORAL | Status: DC
  Administered 2018-09-14 – 2018-09-20 (×7): 1 via ORAL
  Filled 2018-09-14 (×7): qty 1

## 2018-09-14 MED ORDER — NICOTINE 14 MG/24HR TD PT24
14.0000 mg | MEDICATED_PATCH | Freq: Every day | TRANSDERMAL | Status: DC
  Administered 2018-09-14 – 2018-09-19 (×6): 14 mg via TRANSDERMAL
  Filled 2018-09-14 (×6): qty 1

## 2018-09-14 NOTE — Progress Notes (Signed)
[facility] Progress Note  Patient: Courtney Terry DDU:[id number]   PCP: [name], MD DOB: 1960-11-01   DOA: 09/11/2018   DOS: 09/14/2018   Date of Service: the patient was seen and examined on 09/14/2018  Brief hospital course: Pt. with PMH of bipolar disorder, substance abuse, breast cancer, hypothyroidism, chronic diastolic CHF, steroid dependent COPD; admitted on 09/11/2018, presented with complaint of shortness of breath, was found to have COPD exacerbation. Currently further plan is continue steroids.  Subjective: Feeling better but still short of breath.  No nausea no vomiting.  Looking for assistance with drug rehab.  Assessment and Plan: COPD exacerbation Acute on chronic hypercarbic & hypoxic respiratory failure BS remain quite distant.  She wears oxygen 3L at home and is on chronic steroids.  -continue solumedrol -flutter valve -nebulizer  Syncope had a syncopal episode the day prior to admission in setting of coughing fit and without chest pain or palpitations- likely reflex syncope in this setting-hypovolemiaalsolikelycontributing -no further episodes -2 CT scans are negative for any acute abnormality.  Bipolar disorder denies SI, HI, or hallucinations- continue Depakote, Prozac, Zyprexa. She is somewhat unengaging with interview and exam  Substance abuse ongoing cocaine use(smokes cocaine)- states she wants to quit-social work consulted. UDS +cocaine -monitor for withdrawal  Breast cancer Status-post right mastectomy- continue Arimidex  Hypothyroidism Continue Synthroid  Hypertension BP soft. Holding Norvasc and diuretics. Bolus 500cc NS Monitor closely  Chronic diastolic CHF hypovolemic on admission Echo 11/2017 ef 23% grade 1 diastolic dysfunction.  Daily weight  7. Body mass index is 13.79 kg/m.  Nutrition Problem: Severe Malnutrition Etiology: chronic illness Interventions: Interventions: Ensure  Enlive (each supplement provides 350kcal and 20 grams of protein)  Pressure Injury 06/08/18 Stage II -  Partial thickness loss of dermis presenting as a shallow open ulcer with a red, pink wound bed without slough. (Active)  06/08/18 2000   Location: Sacrum  Location Orientation:   Staging: Stage II -  Partial thickness loss of dermis presenting as a shallow open ulcer with a red, pink wound bed without slough.  Wound Description (Comments):   Present on Admission: Yes     Diet: Regular diet DVT Prophylaxis: subcutaneous Heparin  Advance goals of care discussion: full code  Family Communication: family was present at bedside, at the time of interview. The pt provided permission to discuss medical plan with the family. Opportunity was given to ask question and all questions were answered satisfactorily.   Disposition:  Discharge to home.  Consultants: none Procedures: none  Scheduled Meds: . anastrozole  1 mg Oral Daily  . arformoterol  15 mcg Nebulization BID  . azithromycin  500 mg Oral Daily  . divalproex  500 mg Oral BID  . enoxaparin (LOVENOX) injection  30 mg Subcutaneous Daily  . famotidine  20 mg Oral QHS  . feeding supplement (ENSURE ENLIVE)  237 mL Oral TID BM  . FLUoxetine  40 mg Oral Daily  . levothyroxine  75 mcg Oral QAC breakfast  . methylPREDNISolone (SOLU-MEDROL) injection  40 mg Intravenous Q8H  . multivitamin with minerals  1 tablet Oral Daily  . nicotine  14 mg Transdermal Daily  . OLANZapine zydis  20 mg Oral QHS  . pantoprazole  40 mg Oral Daily  . sodium chloride flush  3 mL Intravenous Q12H  . umeclidinium bromide  1 puff Inhalation Daily   Continuous Infusions: . sodium chloride 10 mL/hr at 09/13/18 1419   PRN Meds: acetaminophen, albuterol, ondansetron **OR** ondansetron (ZOFRAN) IV,  senna-docusate Antibiotics: Anti-infectives (From admission, onward)   Start     Dose/Rate Route Frequency Ordered Stop   09/13/18 1000  azithromycin (ZITHROMAX)  tablet 500 mg     500 mg Oral Daily 09/12/18 1037 09/19/18 0959   09/12/18 0500  azithromycin (ZITHROMAX) 500 mg in sodium chloride 0.9 % 250 mL IVPB  Status:  Discontinued     500 mg 250 mL/hr over 60 Minutes Intravenous Daily 09/12/18 0339 09/12/18 1037       Objective: Physical Exam: Vitals:   09/14/18 0052 09/14/18 0802 09/14/18 0839 09/14/18 1102  BP: 94/67  95/65   Pulse: 72  90   Resp: 16     Temp:   98.4 F (36.9 C)   TempSrc:   Oral   SpO2: 96% 96% 95% 90%  Weight:      Height:        Intake/Output Summary (Last 24 hours) at 09/14/2018 1412 Last data filed at 09/13/2018 1800 Gross per 24 hour  Intake 1487.32 ml  Output -  Net 1487.32 ml   Filed Weights   09/11/18 2212 09/13/18 0600  Weight: 40.8 kg 43.6 kg   General: Alert, Awake and Oriented to Time, Place and Person. Appear in mild distress, affect appropriate Eyes: PERRL, Conjunctiva normal ENT: Oral Mucosa clear moist. Neck: no JVD, no Abnormal Mass Or lumps Cardiovascular: S1 and S2 Present, no Murmur, Peripheral Pulses Present Respiratory: normal respiratory effort, Bilateral Air entry equal and Decreased, no use of accessory muscle, no Crackles, Occasional  wheezes Abdomen: Bowel Sound present, Soft and no tenderness, no hernia Skin: no redness, no Rash, no induration Extremities: no Pedal edema, no calf tenderness Neurologic: Grossly no focal neuro deficit. Bilaterally Equal motor strength  Data Reviewed: CBC: Recent Labs  Lab 09/11/18 2230 09/13/18 0641  WBC 8.3 10.0  NEUTROABS 6.2  --   HGB 12.5 11.1*  HCT 45.8 37.6  MCV 97.4 92.8  PLT 191 616   Basic Metabolic Panel: Recent Labs  Lab 09/12/18 0236 09/13/18 0641  NA 143 145  K 4.1 4.3  CL 89* 97*  CO2 38* 38*  GLUCOSE 106* 102*  BUN 14 15  CREATININE 0.64 0.43*  CALCIUM 9.2 8.7*  MG  --  1.8    Liver Function Tests: No results for input(s): AST, ALT, ALKPHOS, BILITOT, PROT, ALBUMIN in the last 168 hours. No results for  input(s): LIPASE, AMYLASE in the last 168 hours. No results for input(s): AMMONIA in the last 168 hours. Coagulation Profile: No results for input(s): INR, PROTIME in the last 168 hours. Cardiac Enzymes: Recent Labs  Lab 09/12/18 0236  TROPONINI <0.03   BNP (last 3 results) No results for input(s): PROBNP in the last 8760 hours. CBG: No results for input(s): GLUCAP in the last 168 hours. Studies: Ct Head Wo Contrast  Result Date: 09/14/2018 CLINICAL DATA:  Abnormal CT scan, follow-up. EXAM: CT HEAD WITHOUT CONTRAST TECHNIQUE: Contiguous axial images were obtained from the base of the skull through the vertex without intravenous contrast. COMPARISON:  09/11/2018 FINDINGS: Brain: The previously seen low-density in the left cerebellar hemisphere is no longer visualized and likely was artifactual. No acute intracranial abnormality. Specifically, no hemorrhage, hydrocephalus, mass lesion, acute infarction, or significant intracranial injury. Vascular: No hyperdense vessel or unexpected calcification. Skull: No acute calvarial abnormality. Sinuses/Orbits: Visualized paranasal sinuses and mastoids clear. Orbital soft tissues unremarkable. Other: None IMPRESSION: No acute intracranial abnormality. Electronically Signed   By: Rolm Baptise M.D.   On: 09/14/2018  07:49     Time spent: 35 minutes  Author: Berle Mull, MD [facility] Pager: [phone] 09/14/2018 2:12 PM  Between 7PM-7AM, please contact night-coverage at [email or web address], password [facility]
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
## 2018-09-14 NOTE — Progress Notes (Signed)
Initial Nutrition Assessment  DOCUMENTATION CODES:   Severe malnutrition in context of chronic illness  INTERVENTION:   - Continue Ensure Enlive TID (each provides 350 kcal and 20 g protein) - Add 2x protein portions with meals - Add MVI with minerals - Pt may benefit from a stool softener or laxative if no BM in the next 24 hrs   NUTRITION DIAGNOSIS:   Severe Malnutrition related to chronic illness as evidenced by percent weight loss, severe fat depletion, severe muscle depletion.   GOAL:   Patient will meet greater than or equal to 90% of their needs   MONITOR:   PO intake, Supplement acceptance, Weight trends  REASON FOR ASSESSMENT:   Consult Assessment of nutrition requirement/status  ASSESSMENT:   57 yo female, admitted with acute exacerbation of COPD. PMH significant for bipolar disorder, cocaine abuse, breast cancer s/p mastectomy, anemia, HTN, depression, hallucination, schizophrenia, hypothyroidism.  Labs: chloride 97, glucose 102, Creatinine 0.43, Hgb 11.1 Meds: Pepcid 20 mg daily, Ensure Enlive TID, synthroid 75 mcg daily, Protonix EC 40 mg daily  Pt awake with caretaker and sister at time of visit.  Pt reports good appetite. Typically eats 1-2 meals daily with snacks between. Does not follow a special diet and does not take MVI at home.   UBW ~150# (68.2 kg), but it has been a long time since she has weighed that much. Per chart, 54.4 kg in June 2019 --> 10.8 kg (20%) wt loss in 6 mo which is clinically significant.  Denies nausea or vomiting, diarrhea, or difficulty chewing or swallowing. Endorses constipation - last BM was 3-4 days ago.   Encouraged pt to include protein-rich foods with all meals and snacks, and to eat those foods first if she is not feeling hungry. Pt amenable to continuing Ensure Enlive TID in any flavor but chocolate. Declined ProStat. Brought pt graham crackers and peanut butter, and a regular Coke, per pt request.   NUTRITION -  FOCUSED PHYSICAL EXAM:   Most Recent Value  Orbital Region  Moderate depletion  Upper Arm Region  Severe depletion  Thoracic and Lumbar Region  Severe depletion  Buccal Region  Moderate depletion  Temple Region  Moderate depletion  Clavicle Bone Region  Severe depletion  Clavicle and Acromion Bone Region  Severe depletion  Scapular Bone Region  Severe depletion  Dorsal Hand  Moderate depletion  Patellar Region  Severe depletion  Anterior Thigh Region  Severe depletion  Posterior Calf Region  Severe depletion  Edema (RD Assessment)  None  Hair  Unable to assess  Eyes  Reviewed  Mouth  Reviewed  Skin  Reviewed  Nails  Reviewed      Diet Order:  No intake documented, per nsg documentation Diet Order            Diet regular Room service appropriate? Yes; Fluid consistency: Thin  Diet effective now              EDUCATION NEEDS:  No education needs have been identified at this time  Skin:  Skin Assessment: Reviewed RN Assessment  Last BM:  PTA  Height:  Ht Readings from Last 1 Encounters:  09/11/18 5\' 10"  (1.778 m)    Weight:  Wt Readings from Last 1 Encounters:  09/13/18 43.6 kg    Ideal Body Weight:  72.7 kg  BMI:  Body mass index is 13.79 kg/m.  Estimated Nutritional Needs:   Kcal:  1665 calories daily (MSJ x 1.5)  Protein:  73-95 gm daily (  1.0-1.3 g/kg IBW)  Fluid:  >/= 1.6 L daily or per MD discretion  Althea Grimmer, MS, RDN, LDN Pager: [phone]

## 2018-09-15 MED ORDER — PREDNISONE 20 MG PO TABS
50.0000 mg | ORAL_TABLET | Freq: Every day | ORAL | Status: DC
  Administered 2018-09-16 – 2018-09-17 (×2): 50 mg via ORAL
  Filled 2018-09-15 (×3): qty 2

## 2018-09-15 MED ORDER — MECLIZINE HCL 12.5 MG PO TABS
12.5000 mg | ORAL_TABLET | Freq: Three times a day (TID) | ORAL | Status: DC
  Administered 2018-09-15 – 2018-09-16 (×3): 12.5 mg via ORAL
  Filled 2018-09-15 (×3): qty 1

## 2018-09-15 MED ORDER — SODIUM CHLORIDE 0.9 % IV BOLUS
500.0000 mL | Freq: Once | INTRAVENOUS | Status: AC
  Administered 2018-09-15: 500 mL via INTRAVENOUS

## 2018-09-15 MED ORDER — SODIUM CHLORIDE 0.9 % IV SOLN
INTRAVENOUS | Status: DC
  Administered 2018-09-15 (×2): via INTRAVENOUS

## 2018-09-15 NOTE — Care Management Note (Signed)
Case Management Note  Patient Details  Name: Courtney Terry MRN: [id number] Date of Birth: July 26, 1961  Subjective/Objective:    From home, per pt eval rec HHPT, patient chose [facility], referral made to [place]. Soc will begin 24-48hrs post dc.  She also needs rolling walker.                 Action/Plan: DC home when ready.  Expected Discharge Date:                  Expected Discharge Plan:  [place]  In-House Referral:     Discharge planning Services  CM Consult  Post Acute Care Choice:  Home Health Choice offered to:  Patient  DME Arranged:    DME Agency:     HH Arranged:  PT [place]:  [place]  Status of Service:  In process, will continue to follow  If discussed at Long Length of Stay Meetings, dates discussed:    Additional Comments:  Zenon Mayo, RN 09/15/2018, 4:36 PM

## 2018-09-15 NOTE — Progress Notes (Signed)
[facility] Progress Note  Patient: Courtney Terry BHA:[id number]   PCP: [name], MD DOB: 20-Jul-1961   DOA: 09/11/2018   DOS: 09/15/2018   Date of Service: the patient was seen and examined on 09/15/2018  Brief hospital course: Pt. with PMH of bipolar disorder, substance abuse, breast cancer, hypothyroidism, chronic diastolic CHF, steroid dependent COPD; admitted on 09/11/2018, presented with complaint of shortness of breath, was found to have COPD exacerbation. Currently further plan is continue steroids.  Subjective: Reports dizziness and fatigue.  No nausea no vomiting.  Oral intake is improving.  Assessment and Plan: COPD exacerbation secondary to acute bronchitis Acute on chronic hypercarbic & hypoxic respiratory failure BS remain quite distant.  She wears oxygen 3L at home and is on chronic steroids.  Continue nebulizers, steroids, oral antibiotics  Syncope Dizziness had a syncopal episode the day prior to admission in setting of coughing fit and without chest pain or palpitations- likely reflex syncope in this setting-hypovolemiaalsolikelycontributing -no further episodes -2 CT scans are negative for any acute abnormality. -Per PT note from 09/14/2018 patient did have 15 mm Hg drop in blood pressure although no numbers actually documented.  Unable to tolerate orthostatic vitals on 09/15/2018. We will give IV fluid bolus and continue with IV fluids overnight and recheck orthostatic tomorrow. Also add meclizine.  Bipolar disorder denies SI, HI, or hallucinations- continue Depakote, Prozac, Zyprexa. She is somewhat unengaging with interview and exam  Substance abuse ongoing cocaine use(smokes cocaine)- states she wants to quit-social work consulted. UDS +cocaine -monitor for withdrawal  Breast cancer Status-post right mastectomy- continue Arimidex  Hypothyroidism Continue Synthroid  Hypertension BP soft. Holding Norvasc and  diuretics. Bolus 500cc NS Monitor closely  Chronic diastolic CHF hypovolemic on admission Echo 11/2017 ef 97% grade 1 diastolic dysfunction.  Daily weight Monitor while the patient is receiving IV hydration.  7. Body mass index is 13.79 kg/m.  Nutrition Problem: Severe Malnutrition Etiology: chronic illness Interventions: Interventions: Ensure Enlive (each supplement provides 350kcal and 20 grams of protein)  Pressure Injury 06/08/18 Stage II -  Partial thickness loss of dermis presenting as a shallow open ulcer with a red, pink wound bed without slough. (Active)  06/08/18 2000   Location: Sacrum  Location Orientation:   Staging: Stage II -  Partial thickness loss of dermis presenting as a shallow open ulcer with a red, pink wound bed without slough.  Wound Description (Comments):   Present on Admission: Yes     Diet: Regular diet DVT Prophylaxis: subcutaneous Heparin  Advance goals of care discussion: full code  Family Communication: family was present at bedside, at the time of interview. The pt provided permission to discuss medical plan with the family. Opportunity was given to ask question and all questions were answered satisfactorily.   Disposition:  Discharge to home likely tomorrow depending on improvement in dizziness.  Consultants: none Procedures: none  Scheduled Meds: . anastrozole  1 mg Oral Daily  . arformoterol  15 mcg Nebulization BID  . azithromycin  500 mg Oral Daily  . divalproex  500 mg Oral BID  . enoxaparin (LOVENOX) injection  30 mg Subcutaneous Daily  . famotidine  20 mg Oral QHS  . feeding supplement (ENSURE ENLIVE)  237 mL Oral TID BM  . FLUoxetine  40 mg Oral Daily  . levothyroxine  75 mcg Oral QAC breakfast  . meclizine  12.5 mg Oral TID  . multivitamin with minerals  1 tablet Oral Daily  . nicotine  14 mg Transdermal  Daily  . OLANZapine zydis  20 mg Oral QHS  . pantoprazole  40 mg Oral Daily  . [START ON 09/16/2018] predniSONE   50 mg Oral Q breakfast  . sodium chloride flush  3 mL Intravenous Q12H  . umeclidinium bromide  1 puff Inhalation Daily   Continuous Infusions: . sodium chloride 50 mL/hr at 09/15/18 1200   PRN Meds: acetaminophen, albuterol, ondansetron **OR** ondansetron (ZOFRAN) IV, senna-docusate Antibiotics: Anti-infectives (From admission, onward)   Start     Dose/Rate Route Frequency Ordered Stop   09/13/18 1000  azithromycin (ZITHROMAX) tablet 500 mg     500 mg Oral Daily 09/12/18 1037 09/19/18 0959   09/12/18 0500  azithromycin (ZITHROMAX) 500 mg in sodium chloride 0.9 % 250 mL IVPB  Status:  Discontinued     500 mg 250 mL/hr over 60 Minutes Intravenous Daily 09/12/18 0339 09/12/18 1037       Objective: Physical Exam: Vitals:   09/14/18 1102 09/14/18 1942 09/15/18 0747 09/15/18 0830  BP:    (!) 87/52  Pulse:    86  Resp:    15  Temp:    99.2 F (37.3 C)  TempSrc:    Oral  SpO2: 90% 94% 95% 97%  Weight:      Height:        Intake/Output Summary (Last 24 hours) at 09/15/2018 1544 Last data filed at 09/15/2018 1500 Gross per 24 hour  Intake 675.79 ml  Output -  Net 675.79 ml   Filed Weights   09/11/18 2212 09/13/18 0600  Weight: 40.8 kg 43.6 kg   General: Alert, Awake and Oriented to Time, Place and Person. Appear in mild distress, affect appropriate Eyes: PERRL, Conjunctiva normal ENT: Oral Mucosa clear moist. Neck: no JVD, no Abnormal Mass Or lumps Cardiovascular: S1 and S2 Present, no Murmur, Peripheral Pulses Present Respiratory: normal respiratory effort, Bilateral Air entry equal and Decreased, no use of accessory muscle, no Crackles, Occasional  wheezes Abdomen: Bowel Sound present, Soft and no tenderness, no hernia Skin: no redness, no Rash, no induration Extremities: no Pedal edema, no calf tenderness Neurologic: Grossly no focal neuro deficit. Bilaterally Equal motor strength  Data Reviewed: CBC: Recent Labs  Lab 09/11/18 2230 09/13/18 0641  WBC 8.3 10.0    NEUTROABS 6.2  --   HGB 12.5 11.1*  HCT 45.8 37.6  MCV 97.4 92.8  PLT 191 027   Basic Metabolic Panel: Recent Labs  Lab 09/12/18 0236 09/13/18 0641  NA 143 145  K 4.1 4.3  CL 89* 97*  CO2 38* 38*  GLUCOSE 106* 102*  BUN 14 15  CREATININE 0.64 0.43*  CALCIUM 9.2 8.7*  MG  --  1.8    Liver Function Tests: No results for input(s): AST, ALT, ALKPHOS, BILITOT, PROT, ALBUMIN in the last 168 hours. No results for input(s): LIPASE, AMYLASE in the last 168 hours. No results for input(s): AMMONIA in the last 168 hours. Coagulation Profile: No results for input(s): INR, PROTIME in the last 168 hours. Cardiac Enzymes: Recent Labs  Lab 09/12/18 0236  TROPONINI <0.03   BNP (last 3 results) No results for input(s): PROBNP in the last 8760 hours. CBG: No results for input(s): GLUCAP in the last 168 hours. Studies: No results found.   Time spent: 35 minutes  Author: Berle Mull, MD [facility] Pager: [phone] 09/15/2018 3:44 PM  Between 7PM-7AM, please contact night-coverage at [email or web address], password [facility]

## 2018-09-16 ENCOUNTER — Ambulatory Visit: Payer: Medicaid Other | Admitting: Pulmonary Disease

## 2018-09-16 DIAGNOSIS — J441 Chronic obstructive pulmonary disease with (acute) exacerbation: Secondary | ICD-10-CM

## 2018-09-16 MED ORDER — MIDODRINE HCL 5 MG PO TABS
5.0000 mg | ORAL_TABLET | Freq: Three times a day (TID) | ORAL | Status: DC
  Administered 2018-09-16 – 2018-09-17 (×3): 5 mg via ORAL
  Filled 2018-09-16 (×3): qty 1

## 2018-09-16 MED ORDER — SODIUM CHLORIDE 0.9 % IV SOLN
INTRAVENOUS | Status: AC
  Administered 2018-09-16: 14:00:00 via INTRAVENOUS

## 2018-09-16 MED ORDER — SODIUM CHLORIDE 0.9 % IV BOLUS
500.0000 mL | Freq: Once | INTRAVENOUS | Status: AC
  Administered 2018-09-16: 500 mL via INTRAVENOUS

## 2018-09-16 MED ORDER — OLANZAPINE 5 MG PO TBDP
10.0000 mg | ORAL_TABLET | Freq: Every day | ORAL | Status: DC
  Administered 2018-09-16 – 2018-09-19 (×4): 10 mg via ORAL
  Filled 2018-09-16 (×4): qty 2

## 2018-09-16 MED ORDER — AZITHROMYCIN 250 MG PO TABS: 250.0000 mg | ORAL_TABLET | Freq: Every day | ORAL | 0 refills | Status: DC

## 2018-09-16 MED ORDER — PREDNISONE 20 MG PO TABS: 20.0000 mg | ORAL_TABLET | Freq: Every day | ORAL | 0 refills | Status: DC

## 2018-09-16 NOTE — Progress Notes (Signed)
[facility] Progress Note  Patient: Courtney Terry:[id number]   PCP: [name], MD DOB: [birth year]   DOA: 09/11/2018   DOS: 09/16/2018   Date of Service: the patient was seen and examined on 09/16/2018  Brief hospital course: Pt. with PMH of bipolar disorder, substance abuse, breast cancer, hypothyroidism, chronic diastolic CHF, steroid dependent COPD; admitted on 09/11/2018, presented with complaint of shortness of breath, was found to have COPD exacerbation. Currently further plan is continue steroids.  Subjective:  -Feels okay breathing better, ready to go home -Became dizzy with activity yesterday has not gotten out of bed yet  Assessment and Plan:   Acute on chronic hypercarbic & hypoxic respiratory failure -Due to COPD exacerbation -Improving with antibiotics, steroids -Uses 3 L O2 at baseline and chronic steroids  Presyncope/orthostatic hypotension -Orthostatic vital signs were positive -Give fluid bolus now, start low-dose midodrine -Last echo in 2/201 9 showed preserved EF -Reassess tomorrow -Discontinue meclizine -Norvasc and diuretic were discontinued 2 days ago  Bipolar disorder denies SI, HI, or hallucinations- continue Depakote, Prozac, Zyprexa.  -She is somewhat unengaging with interview and exam -Cutdown Zyprexa dose  Substance abuse ongoing cocaine use(smokes cocaine)- states she wants to quit-social work consulted. UDS +cocaine -Counseled  Breast cancer Status-post right mastectomy- continue Arimidex  Hypothyroidism Continue Synthroid  Hypertension -Norvasc and diuretics discontinued  Chronic diastolic CHF hypovolemic on admission Echo 11/2017 ef 44% grade 1 diastolic dysfunction.  -Give fluid bolus now, positive orthostatic vitals  7. Body mass index is 13.79 kg/m.  Nutrition Problem: Severe Malnutrition Etiology: chronic illness Interventions: Interventions: Ensure Enlive (each supplement provides  350kcal and 20 grams of protein)  Pressure Injury 06/08/18 Stage II -  Partial thickness loss of dermis presenting as a shallow open ulcer with a red, pink wound bed without slough. (Active)  06/08/18 2000   Location: Sacrum  Location Orientation:   Staging: Stage II -  Partial thickness loss of dermis presenting as a shallow open ulcer with a red, pink wound bed without slough.  Wound Description (Comments):   Present on Admission: Yes     Diet: Regular diet DVT Prophylaxis: subcutaneous Heparin  Advance goals of care discussion: full code  Family Communication: Friend at bedside  Disposition: Possibly home tomorrow pending improvement in orthostatic hypotension and dizziness  Consultants: none Procedures: none  Scheduled Meds: . anastrozole  1 mg Oral Daily  . arformoterol  15 mcg Nebulization BID  . azithromycin  500 mg Oral Daily  . divalproex  500 mg Oral BID  . enoxaparin (LOVENOX) injection  30 mg Subcutaneous Daily  . famotidine  20 mg Oral QHS  . feeding supplement (ENSURE ENLIVE)  237 mL Oral TID BM  . FLUoxetine  40 mg Oral Daily  . levothyroxine  75 mcg Oral QAC breakfast  . midodrine  5 mg Oral TID WC  . multivitamin with minerals  1 tablet Oral Daily  . nicotine  14 mg Transdermal Daily  . OLANZapine zydis  10 mg Oral QHS  . pantoprazole  40 mg Oral Daily  . predniSONE  50 mg Oral Q breakfast  . sodium chloride flush  3 mL Intravenous Q12H  . umeclidinium bromide  1 puff Inhalation Daily   Continuous Infusions: . sodium chloride 75 mL/hr at 09/16/18 1402   PRN Meds: acetaminophen, albuterol, ondansetron **OR** ondansetron (ZOFRAN) IV, senna-docusate Antibiotics: Anti-infectives (From admission, onward)   Start     Dose/Rate Route Frequency Ordered Stop   09/16/18 0000  azithromycin (ZITHROMAX)  250 MG tablet     250 mg Oral Daily 09/16/18 1041     09/13/18 1000  azithromycin (ZITHROMAX) tablet 500 mg     500 mg Oral Daily 09/12/18 1037 09/19/18 0959    09/12/18 0500  azithromycin (ZITHROMAX) 500 mg in sodium chloride 0.9 % 250 mL IVPB  Status:  Discontinued     500 mg 250 mL/hr over 60 Minutes Intravenous Daily 09/12/18 0339 09/12/18 1037       Objective: Physical Exam: Vitals:   09/16/18 0006 09/16/18 0807 09/16/18 0838 09/16/18 1100  BP: (!) 97/54  (!) 87/55   Pulse: 99  88   Resp: 20     Temp: 98.7 F (37.1 C)  98.8 F (37.1 C)   TempSrc: Oral  Oral   SpO2: 95% 96% 95% 95%  Weight:      Height:       No intake or output data in the 24 hours ending 09/16/18 1502 Filed Weights   09/11/18 2212 09/13/18 0600  Weight: 40.8 kg 43.6 kg   Gen: Awake, Alert, Oriented X 3, frail, cachectic female laying in bed HEENT: PERRLA, Neck supple, no JVD Lungs: Poor air movement, faint bilateral rhonchi CVS: RRR,No Gallops,Rubs or new Murmurs Abd: soft, Non tender, non distended, BS present Extremities: No edema Skin: no new rashes  Data Reviewed: CBC: Recent Labs  Lab 09/11/18 2230 09/13/18 0641  WBC 8.3 10.0  NEUTROABS 6.2  --   HGB 12.5 11.1*  HCT 45.8 37.6  MCV 97.4 92.8  PLT 191 034   Basic Metabolic Panel: Recent Labs  Lab 09/12/18 0236 09/13/18 0641  NA 143 145  K 4.1 4.3  CL 89* 97*  CO2 38* 38*  GLUCOSE 106* 102*  BUN 14 15  CREATININE 0.64 0.43*  CALCIUM 9.2 8.7*  MG  --  1.8    Liver Function Tests: No results for input(s): AST, ALT, ALKPHOS, BILITOT, PROT, ALBUMIN in the last 168 hours. No results for input(s): LIPASE, AMYLASE in the last 168 hours. No results for input(s): AMMONIA in the last 168 hours. Coagulation Profile: No results for input(s): INR, PROTIME in the last 168 hours. Cardiac Enzymes: Recent Labs  Lab 09/12/18 0236  TROPONINI <0.03   BNP (last 3 results) No results for input(s): PROBNP in the last 8760 hours. CBG: No results for input(s): GLUCAP in the last 168 hours. Studies: No results found.   Time spent: 35 minutes  [place] Page via  [email or web address] 09/16/2018 3:02 PM  Between 7PM-7AM, please contact night-coverage at [email or web address], password [facility]
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
## 2018-09-17 LAB — CBC
HCT: 34.7 % — ABNORMAL LOW (ref 36.0–46.0)
HEMOGLOBIN: 9.9 g/dL — AB (ref 12.0–15.0)
MCH: 26.6 pg (ref 26.0–34.0)
MCHC: 28.5 g/dL — ABNORMAL LOW (ref 30.0–36.0)
MCV: 93.3 fL (ref 80.0–100.0)
Platelets: 192 10*3/uL (ref 150–400)
RBC: 3.72 MIL/uL — ABNORMAL LOW (ref 3.87–5.11)
RDW: 14.9 % (ref 11.5–15.5)
WBC: 11.3 10*3/uL — ABNORMAL HIGH (ref 4.0–10.5)
nRBC: 0 % (ref 0.0–0.2)

## 2018-09-17 LAB — BASIC METABOLIC PANEL
Anion gap: 7 (ref 5–15)
BUN: 14 mg/dL (ref 6–20)
CO2: 32 mmol/L (ref 22–32)
Calcium: 8.3 mg/dL — ABNORMAL LOW (ref 8.9–10.3)
Chloride: 105 mmol/L (ref 98–111)
Creatinine, Ser: 0.42 mg/dL — ABNORMAL LOW (ref 0.44–1.00)
GFR calc Af Amer: >60 mL/min (ref >60)
GFR calc non Af Amer: >60 mL/min (ref >60)
GLUCOSE: 78 mg/dL (ref 70–99)
POTASSIUM: 3.6 mmol/L (ref 3.5–5.1)
Sodium: 144 mmol/L (ref 135–145)

## 2018-09-17 MED ORDER — PREDNISONE 20 MG PO TABS
40.0000 mg | ORAL_TABLET | Freq: Every day | ORAL | Status: DC
  Administered 2018-09-18 – 2018-09-19 (×2): 40 mg via ORAL
  Filled 2018-09-17 (×2): qty 2

## 2018-09-17 MED ORDER — MIDODRINE HCL 5 MG PO TABS
10.0000 mg | ORAL_TABLET | Freq: Three times a day (TID) | ORAL | Status: DC
  Administered 2018-09-17 – 2018-09-20 (×9): 10 mg via ORAL
  Filled 2018-09-17 (×9): qty 2

## 2018-09-17 MED ORDER — MIDODRINE HCL 5 MG PO TABS
5.0000 mg | ORAL_TABLET | Freq: Once | ORAL | Status: AC
  Administered 2018-09-17: 5 mg via ORAL
  Filled 2018-09-17: qty 1

## 2018-09-17 NOTE — Progress Notes (Addendum)
[facility] Progress Note  Patient: Courtney Terry AOZ:[id number]   PCP: [name], MD DOB: January 04, 1961   DOA: 09/11/2018   DOS: 09/17/2018   Date of Service: the patient was seen and examined on 09/17/2018  Brief hospital course: Pt. with PMH of bipolar disorder, substance abuse, breast cancer, hypothyroidism, chronic diastolic CHF, steroid dependent COPD; admitted on 09/11/2018, presented with complaint of shortness of breath, was found to have COPD exacerbation. Currently further plan is continue steroids.  Subjective:  -breathing improving, became very dizzy yesterday  Assessment and Plan:   Acute on chronic hypercarbic & hypoxic respiratory failure -Due to COPD exacerbation -Improving with antibiotics, steroids -Uses 3 L O2 at baseline and chronic steroids -Continue to taper steroids  Presyncope/orthostatic hypotension -Orthostatic vital signs were positive, orthostatic vital signs continue to be positive -Given IV fluid boluses and started midodrine yesterday will double dose to 10 mg 3 times daily -I suspect she may have a component of adrenal insufficiency from chronic steroid use -Last echocardiogram in 11/2017 with preserved ejection fraction -Norvasc and diuretic were discontinued earlier this admission -Liberalize salt intake -Adjust medications to see if they could be contributing to orthostatic hypotension  Bipolar disorder denies SI, HI, or hallucinations- continue Depakote, Prozac, Zyprexa.  -She is somewhat unengaging with interview and exam -Cutdown Zyprexa dose  Substance abuse ongoing cocaine use(smokes cocaine)- states she wants to quit-social work consulted. UDS +cocaine -Counseled  Breast cancer Status-post right mastectomy- continue Arimidex  Hypothyroidism Continue Synthroid  Hypertension -Norvasc and diuretics discontinued  Chronic diastolic CHF hypovolemic on admission Echo 11/2017 ef 96% grade 1 diastolic  dysfunction.  -Give fluid bolus now, positive orthostatic vitals  7. Body mass index is 13.79 kg/m.  Nutrition Problem: Severe Malnutrition Etiology: chronic illness Interventions: Interventions: Ensure Enlive (each supplement provides 350kcal and 20 grams of protein)  Pressure Injury 06/08/18 Stage II -  Partial thickness loss of dermis presenting as a shallow open ulcer with a red, pink wound bed without slough. (Active)  06/08/18 2000  Location: Sacrum  Location Orientation:   Staging: Stage II -  Partial thickness loss of dermis presenting as a shallow open ulcer with a red, pink wound bed without slough.  Wound Description (Comments):   Present on Admission: Yes     Diet: Regular diet DVT Prophylaxis: subcutaneous Heparin  Advance goals of care discussion: full code  Family Communication: Friend at bedside  Disposition: Home pending improvement in orthostatics and symptomatic dizziness   Consultants: none Procedures: none  Scheduled Meds: . anastrozole  1 mg Oral Daily  . arformoterol  15 mcg Nebulization BID  . azithromycin  500 mg Oral Daily  . divalproex  500 mg Oral BID  . enoxaparin (LOVENOX) injection  30 mg Subcutaneous Daily  . famotidine  20 mg Oral QHS  . feeding supplement (ENSURE ENLIVE)  237 mL Oral TID BM  . FLUoxetine  40 mg Oral Daily  . levothyroxine  75 mcg Oral QAC breakfast  . midodrine  10 mg Oral TID  . multivitamin with minerals  1 tablet Oral Daily  . nicotine  14 mg Transdermal Daily  . OLANZapine zydis  10 mg Oral QHS  . pantoprazole  40 mg Oral Daily  . predniSONE  50 mg Oral Q breakfast  . sodium chloride flush  3 mL Intravenous Q12H  . umeclidinium bromide  1 puff Inhalation Daily   Continuous Infusions:  PRN Meds: acetaminophen, albuterol, ondansetron **OR** ondansetron (ZOFRAN) IV, senna-docusate Antibiotics: Anti-infectives (From admission,  onward)   Start     Dose/Rate Route Frequency Ordered Stop   09/16/18 0000   azithromycin (ZITHROMAX) 250 MG tablet     250 mg Oral Daily 09/16/18 1041     09/13/18 1000  azithromycin (ZITHROMAX) tablet 500 mg     500 mg Oral Daily 09/12/18 1037 09/19/18 0959   09/12/18 0500  azithromycin (ZITHROMAX) 500 mg in sodium chloride 0.9 % 250 mL IVPB  Status:  Discontinued     500 mg 250 mL/hr over 60 Minutes Intravenous Daily 09/12/18 0339 09/12/18 1037       Objective: Physical Exam: Vitals:   09/17/18 0933 09/17/18 0937 09/17/18 1054 09/17/18 1057  BP: 108/63 95/80 (!) 101/58 (!) 96/52  Pulse: 93 94 92 96  Resp:      Temp:      TempSrc:      SpO2:    100%  Weight:      Height:        Intake/Output Summary (Last 24 hours) at 09/17/2018 1311 Last data filed at 09/16/2018 2000 Gross per 24 hour  Intake 686.1 ml  Output -  Net 686.1 ml   Filed Weights   09/11/18 2212 09/13/18 0600  Weight: 40.8 kg 43.6 kg   Gen: Frail, cachectic female laying in bed, no distress, alert awake oriented x3  HEENT: PERRLA, Neck supple, no JVD Lungs: Poor air movement bilaterally, no expiratory wheezes audible today  CVS: RRR,No Gallops,Rubs or new Murmurs Abd: soft, Non tender, non distended, BS present Extremities: No edema Skin: no new rashes  Data Reviewed: CBC: Recent Labs  Lab 09/11/18 2230 09/13/18 0641 09/17/18 0250  WBC 8.3 10.0 11.3*  NEUTROABS 6.2  --   --   HGB 12.5 11.1* 9.9*  HCT 45.8 37.6 34.7*  MCV 97.4 92.8 93.3  PLT 191 179 021   Basic Metabolic Panel: Recent Labs  Lab 09/12/18 0236 09/13/18 0641 09/17/18 0250  NA 143 145 144  K 4.1 4.3 3.6  CL 89* 97* 105  CO2 38* 38* 32  GLUCOSE 106* 102* 78  BUN 14 15 14   CREATININE 0.64 0.43* 0.42*  CALCIUM 9.2 8.7* 8.3*  MG  --  1.8  --     Liver Function Tests: No results for input(s): AST, ALT, ALKPHOS, BILITOT, PROT, ALBUMIN in the last 168 hours. No results for input(s): LIPASE, AMYLASE in the last 168 hours. No results for input(s): AMMONIA in the last 168 hours. Coagulation  Profile: No results for input(s): INR, PROTIME in the last 168 hours. Cardiac Enzymes: Recent Labs  Lab 09/12/18 0236  TROPONINI <0.03   BNP (last 3 results) No results for input(s): PROBNP in the last 8760 hours. CBG: No results for input(s): GLUCAP in the last 168 hours. Studies: No results found.   Time spent: 30 minutes  [place] Page via [email or web address] 09/17/2018 1:11 PM  Between 7PM-7AM, please contact night-coverage at [email or web address], password [facility]

## 2018-09-18 MED ORDER — FLUDROCORTISONE ACETATE 0.1 MG PO TABS
0.1000 mg | ORAL_TABLET | Freq: Every day | ORAL | Status: DC
  Administered 2018-09-18 – 2018-09-20 (×3): 0.1 mg via ORAL
  Filled 2018-09-18 (×3): qty 1

## 2018-09-18 NOTE — Social Work (Signed)
CSW acknowledging consult for home needs, pt has been arranged for [facility] by RN CM.  CSW signing off. Please consult if any additional needs arise.  [place], [place] Work [phone]

## 2018-09-18 NOTE — Progress Notes (Signed)
[facility] Progress Note  Patient: Courtney Terry KKX:[id number]   PCP: [name], MD DOB: 21-Mar-1961   DOA: 09/11/2018   DOS: 09/18/2018   Date of Service: the patient was seen and examined on 09/18/2018  Brief hospital course: Pt. with PMH of bipolar disorder, substance abuse, breast cancer, hypothyroidism, chronic diastolic CHF, steroid dependent COPD; admitted on 09/11/2018, presented with complaint of shortness of breath, was found to have COPD exacerbation. -Hospitalization further complicated by severe symptomatic orthostatic hypotension  Subjective:  -Feels weak and dizzy, requests to stay in the hospital another day, breathing has improved considerably  Assessment and Plan:   Acute on chronic hypercarbic & hypoxic respiratory failure -Due to COPD exacerbation -Improving with antibiotics, steroids -Uses 3 L O2 at baseline and chronic steroids -Continue to taper steroids  Presyncope/orthostatic hypotension -orthostatic vital signs continue to be positive -Given IV fluid boluses and started midodrine, now maximized to 10 mg 3 times daily -I added Florinef 0.1 mg daily this morning -I suspect she could have adrenal insufficiency from chronic steroid use, unable to do a cosyntropin stim test since patient is currently on prednisone -Norvasc and diuretic discontinued -Medications reviewed -Physical therapy assess again today  Bipolar disorder denies SI, HI, or hallucinations- continue Depakote, Prozac, Zyprexa.  -She is somewhat unengaging with interview and exam -Cutdown Zyprexa dose  Substance abuse ongoing cocaine use(smokes cocaine)- states she wants to quit-social work consulted. UDS +cocaine -Counseled  Breast cancer Status-post right mastectomy- continue Arimidex  Hypothyroidism Continue Synthroid  Hypertension -Norvasc and diuretics discontinued  Chronic diastolic CHF hypovolemic on admission Echo 11/2017 ef 16% grade  1 diastolic dysfunction.  -Remains euvolemic, off IV fluids, positive orthostatic vitals  7. Body mass index is 13.79 kg/m.  Nutrition Problem: Severe Malnutrition Etiology: chronic illness Interventions: Interventions: Ensure Enlive (each supplement provides 350kcal and 20 grams of protein)  Pressure Injury 06/08/18 Stage II -  Partial thickness loss of dermis presenting as a shallow open ulcer with a red, pink wound bed without slough. (Active)  06/08/18 2000  Location: Sacrum  Location Orientation:   Staging: Stage II -  Partial thickness loss of dermis presenting as a shallow open ulcer with a red, pink wound bed without slough.  Wound Description (Comments):   Present on Admission: Yes     Diet: Regular diet DVT Prophylaxis: subcutaneous Heparin  Advance goals of care discussion: full code  Family Communication: Friend at bedside  Disposition: Home pending improvement in orthostatics and symptomatic dizziness   Consultants: none Procedures: none  Scheduled Meds: . anastrozole  1 mg Oral Daily  . arformoterol  15 mcg Nebulization BID  . divalproex  500 mg Oral BID  . enoxaparin (LOVENOX) injection  30 mg Subcutaneous Daily  . famotidine  20 mg Oral QHS  . feeding supplement (ENSURE ENLIVE)  237 mL Oral TID BM  . fludrocortisone  0.1 mg Oral Daily  . FLUoxetine  40 mg Oral Daily  . levothyroxine  75 mcg Oral QAC breakfast  . midodrine  10 mg Oral TID  . multivitamin with minerals  1 tablet Oral Daily  . nicotine  14 mg Transdermal Daily  . OLANZapine zydis  10 mg Oral QHS  . pantoprazole  40 mg Oral Daily  . predniSONE  40 mg Oral Q breakfast  . sodium chloride flush  3 mL Intravenous Q12H  . umeclidinium bromide  1 puff Inhalation Daily   Continuous Infusions:  PRN Meds: acetaminophen, albuterol, ondansetron **OR** ondansetron (ZOFRAN) IV, senna-docusate  Antibiotics: Anti-infectives (From admission, onward)   Start     Dose/Rate Route Frequency Ordered Stop    09/16/18 0000  azithromycin (ZITHROMAX) 250 MG tablet     250 mg Oral Daily 09/16/18 1041     09/13/18 1000  azithromycin (ZITHROMAX) tablet 500 mg     500 mg Oral Daily 09/12/18 1037 09/18/18 0900   09/12/18 0500  azithromycin (ZITHROMAX) 500 mg in sodium chloride 0.9 % 250 mL IVPB  Status:  Discontinued     500 mg 250 mL/hr over 60 Minutes Intravenous Daily 09/12/18 0339 09/12/18 1037       Objective: Physical Exam: Vitals:   09/18/18 0826 09/18/18 0900 09/18/18 1000 09/18/18 1100  BP: (!) 89/68     Pulse: 97     Resp:  14 18 14   Temp: 99.3 F (37.4 C)     TempSrc: Oral     SpO2: 95%     Weight:      Height:       No intake or output data in the 24 hours ending 09/18/18 1310 Filed Weights   09/11/18 2212 09/13/18 0600  Weight: 40.8 kg 43.6 kg   Gen: Frail, cachectic female, laying in bed, no distress, alert awake oriented x3 HEENT: PERRLA, Neck supple, no JVD Lungs: Very poor air movement, no expiratory wheezes today CVS: RRR,No Gallops,Rubs or new Murmurs Abd: soft, Non tender, non distended, BS present Extremities: No edema Skin: no new rashes  Data Reviewed: CBC: Recent Labs  Lab 09/11/18 2230 09/13/18 0641 09/17/18 0250  WBC 8.3 10.0 11.3*  NEUTROABS 6.2  --   --   HGB 12.5 11.1* 9.9*  HCT 45.8 37.6 34.7*  MCV 97.4 92.8 93.3  PLT 191 179 673   Basic Metabolic Panel: Recent Labs  Lab 09/12/18 0236 09/13/18 0641 09/17/18 0250  NA 143 145 144  K 4.1 4.3 3.6  CL 89* 97* 105  CO2 38* 38* 32  GLUCOSE 106* 102* 78  BUN 14 15 14   CREATININE 0.64 0.43* 0.42*  CALCIUM 9.2 8.7* 8.3*  MG  --  1.8  --     Liver Function Tests: No results for input(s): AST, ALT, ALKPHOS, BILITOT, PROT, ALBUMIN in the last 168 hours. No results for input(s): LIPASE, AMYLASE in the last 168 hours. No results for input(s): AMMONIA in the last 168 hours. Coagulation Profile: No results for input(s): INR, PROTIME in the last 168 hours. Cardiac Enzymes: Recent Labs    Lab 09/12/18 0236  TROPONINI <0.03   BNP (last 3 results) No results for input(s): PROBNP in the last 8760 hours. CBG: No results for input(s): GLUCAP in the last 168 hours. Studies: No results found.   Time spent: 30 minutes  [place] Page via [email or web address] 09/18/2018 1:10 PM  Between 7PM-7AM, please contact night-coverage at [email or web address], password [facility]

## 2018-09-18 NOTE — Progress Notes (Signed)
PT Cancellation Note  Patient Details Name: Courtney Terry MRN: [id number] DOB: 04-26-1961   Cancelled Treatment:    Reason Eval/Treat Not Completed: Fatigue/lethargy limiting ability to participate. Pt sleeping on arrival. Difficult to arouse. Lights turned on with auditory and tactile cues from therapist to wake. Pt briefly opened her eyes then immediately closed them, refusing any interaction with therapist. PT to re-attempt as time allows.   [name] 09/18/2018, 11:06 AM   Lorrin Goodell, PT  Office # [phone] Pager [phone]
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
## 2018-09-19 NOTE — Progress Notes (Signed)
[facility] Progress Note  Patient: Courtney Terry NOB:[id number]   PCP: [name], MD DOB: 10/09/1960   DOA: 09/11/2018   DOS: 09/19/2018   Date of Service: the patient was seen and examined on 09/19/2018  Brief hospital course: Pt. with PMH of bipolar disorder, substance abuse, breast cancer, hypothyroidism, chronic diastolic CHF, steroid dependent COPD; admitted on 09/11/2018, presented with complaint of shortness of breath, was found to have COPD exacerbation. -Hospitalization further complicated by severe symptomatic orthostatic hypotension  Subjective:  -Feels weak, requests to stay another day, significant symptoms not significantly different from baseline  Assessment and Plan:   Acute on chronic hypercarbic & hypoxic respiratory failure -Due to COPD exacerbation -Improving with antibiotics, steroids -Uses 3 L O2 at baseline and chronic steroids -Continue to taper steroids -Remains stable from a pulmonary standpoint  Presyncope/orthostatic hypotension -orthostatic vital signs were positive -Given IV fluid boluses and started midodrine, now maximized to 10 mg 3 times daily -Darted oral Florinef yesterday morning, I suspect she has adrenal insufficiency from chronic steroid use, unable to do cosyntropin stim test due to chronic prednisone use -Antihypertensives discontinued, ambulate, PT -Encouraged activity and ambulation today  Bipolar disorder denies SI, HI, or hallucinations- continue Depakote, Prozac, Zyprexa.  -She is somewhat unengaging with interview and exam -Cutdown Zyprexa dose  Substance abuse ongoing cocaine use(smokes cocaine)- states she wants to quit-social work consulted. UDS +cocaine -Counseled  Breast cancer Status-post right mastectomy- continue Arimidex  Hypothyroidism Continue Synthroid  Hypertension -Norvasc and diuretics discontinued  Chronic diastolic CHF hypovolemic on admission Echo 11/2017 ef 94%  grade 1 diastolic dysfunction.  -Remains euvolemic, off IV fluids, positive orthostatic vitals  7. Body mass index is 13.79 kg/m.  Nutrition Problem: Severe Malnutrition Etiology: chronic illness Interventions: Interventions: Ensure Enlive (each supplement provides 350kcal and 20 grams of protein)  Pressure Injury 06/08/18 Stage II -  Partial thickness loss of dermis presenting as a shallow open ulcer with a red, pink wound bed without slough. (Active)  06/08/18 2000  Location: Sacrum  Location Orientation:   Staging: Stage II -  Partial thickness loss of dermis presenting as a shallow open ulcer with a red, pink wound bed without slough.  Wound Description (Comments):   Present on Admission: Yes     Diet: Regular diet DVT Prophylaxis: subcutaneous Heparin  Advance goals of care discussion: full code  Family Communication: Friend at bedside  Disposition: Home pending improvement in orthostatics and symptomatic dizziness   Consultants: none Procedures: none  Scheduled Meds: . anastrozole  1 mg Oral Daily  . arformoterol  15 mcg Nebulization BID  . enoxaparin (LOVENOX) injection  30 mg Subcutaneous Daily  . famotidine  20 mg Oral QHS  . feeding supplement (ENSURE ENLIVE)  237 mL Oral TID BM  . fludrocortisone  0.1 mg Oral Daily  . FLUoxetine  40 mg Oral Daily  . levothyroxine  75 mcg Oral QAC breakfast  . midodrine  10 mg Oral TID  . multivitamin with minerals  1 tablet Oral Daily  . nicotine  14 mg Transdermal Daily  . OLANZapine zydis  10 mg Oral QHS  . pantoprazole  40 mg Oral Daily  . predniSONE  40 mg Oral Q breakfast  . sodium chloride flush  3 mL Intravenous Q12H  . umeclidinium bromide  1 puff Inhalation Daily   Continuous Infusions:  PRN Meds: acetaminophen, albuterol, ondansetron **OR** ondansetron (ZOFRAN) IV, senna-docusate Antibiotics: Anti-infectives (From admission, onward)   Start     Dose/Rate Route  Frequency Ordered Stop   09/16/18 0000   azithromycin (ZITHROMAX) 250 MG tablet     250 mg Oral Daily 09/16/18 1041     09/13/18 1000  azithromycin (ZITHROMAX) tablet 500 mg     500 mg Oral Daily 09/12/18 1037 09/18/18 0900   09/12/18 0500  azithromycin (ZITHROMAX) 500 mg in sodium chloride 0.9 % 250 mL IVPB  Status:  Discontinued     500 mg 250 mL/hr over 60 Minutes Intravenous Daily 09/12/18 0339 09/12/18 1037       Objective: Physical Exam: Vitals:   09/19/18 0734 09/19/18 0904 09/19/18 1345 09/19/18 1347  BP: (!) 87/55  96/60 127/73  Pulse: 89 92    Resp: 16 16    Temp: 98.7 F (37.1 C)     TempSrc: Oral     SpO2: 96% 96%    Weight:      Height:       No intake or output data in the 24 hours ending 09/19/18 1407 Filed Weights   09/11/18 2212 09/13/18 0600  Weight: 40.8 kg 43.6 kg   Gen: Frail cachectic female, laying in bed, no distress, AAO x3 HEENT: PERRLA, Neck supple, no JVD Lungs: Poor air movement, no expiratory wheezes CVS: RRR,No Gallops,Rubs or new Murmurs Abd: soft, Non tender, non distended, BS present Extremities: No edema Skin: no new rashes   Data Reviewed: CBC: Recent Labs  Lab 09/13/18 0641 09/17/18 0250  WBC 10.0 11.3*  HGB 11.1* 9.9*  HCT 37.6 34.7*  MCV 92.8 93.3  PLT 179 564   Basic Metabolic Panel: Recent Labs  Lab 09/13/18 0641 09/17/18 0250  NA 145 144  K 4.3 3.6  CL 97* 105  CO2 38* 32  GLUCOSE 102* 78  BUN 15 14  CREATININE 0.43* 0.42*  CALCIUM 8.7* 8.3*  MG 1.8  --     Liver Function Tests: No results for input(s): AST, ALT, ALKPHOS, BILITOT, PROT, ALBUMIN in the last 168 hours. No results for input(s): LIPASE, AMYLASE in the last 168 hours. No results for input(s): AMMONIA in the last 168 hours. Coagulation Profile: No results for input(s): INR, PROTIME in the last 168 hours. Cardiac Enzymes: No results for input(s): CKTOTAL, CKMB, CKMBINDEX, TROPONINI in the last 168 hours. BNP (last 3 results) No results for input(s): PROBNP in the last 8760  hours. CBG: No results for input(s): GLUCAP in the last 168 hours. Studies: No results found.   Time spent: 30 minutes  [place] Page via [email or web address] 09/19/2018 2:07 PM  Between 7PM-7AM, please contact night-coverage at [email or web address], password [facility]

## 2018-09-19 NOTE — Progress Notes (Signed)
RW for home use at bedside.

## 2018-09-19 NOTE — Progress Notes (Addendum)
   09/19/18 1347  Vitals  BP 127/73  BP Location Right Leg  BP Method Automatic   Rechecked BP at RLE due to very petite size of UE. LUE reading read 96/60; pt should be using a pediatric BP cuff, which was not available at bedside; hence the use of LE vs UE for more accurate cuff sizing.    Patient refused to stand, but showed no hesitancy in changing position from lying to sitting and rapidly grabbing/consuming ice-cream.

## 2018-09-19 NOTE — Progress Notes (Signed)
Patient's telemetry box/leads checked upon call from [place].

## 2018-09-19 NOTE — Progress Notes (Signed)
Patient and visitor are asking what time she will leave today; advised there is not a scheduled time. Pt denies likelihood of home O2 arriving to bedside for dc, both pt and visitor admit that she does go without O2 at home some times and will probably 'leave without it today, as they dont have anyone to bring it to them".   Will make CM aware, and request review.

## 2018-09-19 NOTE — Progress Notes (Signed)
Rounded on pt, s/p request to see RN. Pt on phone call which she did not seem interested in ending.

## 2018-09-20 MED ORDER — PREDNISONE 20 MG PO TABS: 20.0000 mg | ORAL_TABLET | Freq: Every day | ORAL | 0 refills | Status: DC

## 2018-09-20 MED ORDER — METRONIDAZOLE 500 MG PO TABS: 1000.0000 mg | ORAL_TABLET | Freq: Once | ORAL | Status: DC

## 2018-09-20 MED ORDER — FLUDROCORTISONE ACETATE 0.1 MG PO TABS: 0.1000 mg | ORAL_TABLET | Freq: Every day | ORAL | 0 refills | Status: DC

## 2018-09-20 MED ORDER — MIDODRINE HCL 10 MG PO TABS: 10.0000 mg | ORAL_TABLET | Freq: Three times a day (TID) | ORAL | 0 refills | Status: DC

## 2018-09-20 MED ORDER — FLUCONAZOLE 100 MG PO TABS: 100.0000 mg | ORAL_TABLET | Freq: Once | ORAL | Status: DC

## 2018-09-20 NOTE — Progress Notes (Signed)
Pt is discharged but needs cab voucher, and O2 tank to take home.   Left voice mail for CSW for cab voucher.

## 2018-09-20 NOTE — Progress Notes (Signed)
CSW was consulted by RN Minette Headland for transportation needs for pt.  CSW spoke with pt at bedside.  Pt will need to transport 2 liters of oxygen at disposition.  CSW gave RN Minette Headland taxi voucher for disposition home.  No further needs noted.  CSW signing off. Reed Breech LCSWA [phone]

## 2018-09-25 ENCOUNTER — Telehealth: Payer: Self-pay | Admitting: Pulmonary Disease

## 2018-09-25 MED ORDER — PREDNISONE 10 MG PO TABS: 10.0000 mg | ORAL_TABLET | Freq: Every day | ORAL | 0 refills | Status: DC

## 2018-09-25 NOTE — Telephone Encounter (Signed)
Pt last saw Wyn Quaker, NP 07/06/18 and was told to follow up with RA 6-8 weeks from that [place].   Pt had an appt scheduled with RA 11/13 but she was a no show at that [place]. Pt also had an appt scheduled 12/11 but this OV was cancelled.  Pt is requesting to have her prednisone, levothyroxine, and also her BP med (pt could not tell me the name of the BP med she was taking) but looking at pt's med list we have in computer, pt is on Florinef 0.1mg  and also Proamatine 10mg .  None of the meds pt is requesting a refill of were prescribed by our office. Aaron Edelman, since you saw pt last and since RA is not in office today, can you please advise on these requests for pt. Thanks!

## 2018-09-25 NOTE — Telephone Encounter (Signed)
Pt needs to follow up with PCP regarding these chronic medications. We do not manage these meds.   Patient is scheduled January/03/2019 with an appointment with our office, emphasize that she needs to keep her appointment.   Wyn Quaker FNP

## 2018-09-25 NOTE — Telephone Encounter (Signed)
Call made to patient, made aware we would be sending a 30 day supply of prednisone, voice understanding. I also informed patient that we would not be refilling her BP or thyroid medication and that she needed to f/u with her PCP. Patient voiced understanding. Pharmacy confirmed. Refill. I encouraged the patient to use her Bevespi daily and when she ran out to call and see if we have samples. Nothing further is needed at this time.

## 2018-09-25 NOTE — Telephone Encounter (Signed)
I will refill patients chronic prednisone for 30 days no refills for patient to make it to her next OV with our office.   Please place the order.   Ensure patient has her Bevespi and has been using it.   Pt needs to follow up with her PCP, if she would like referral for a new pcp then we can provide that referral.    Wyn Quaker FNP

## 2018-10-02 ENCOUNTER — Other Ambulatory Visit: Payer: Self-pay | Admitting: Hematology

## 2018-10-02 DIAGNOSIS — C50411 Malignant neoplasm of upper-outer quadrant of right female breast: Secondary | ICD-10-CM

## 2018-10-09 NOTE — Discharge Summary (Addendum)
Physician Discharge Summary  Courtney Terry LZJ:[id number] DOB: [birth year] DOA: 09/11/2018  PCP: [name], MD  Admit date: 09/11/2018 Discharge date: 09/20/2018  Time spent: 35 minutes  Recommendations for Outpatient Follow-up:  PCP in 1 week, [facility] 12/20 Home health PT/RN  Discharge Diagnoses:  Principal Problem:   COPD with acute exacerbation ([place])   Orthostatic hypotension   Malignant neoplasm of upper-outer quadrant of female breast ([place])   COPD/chronic resp failure on home O2   Polysubstance abuse ([place])   Acute and chronic respiratory failure with hypercapnia ([facility])   Hypothyroidism   Bipolar I disorder ([place])   Syncope   Severely underweight adult   Discharge Condition: stable  Diet recommendation: regular  Filed Weights   09/11/18 2212 09/13/18 0600  Weight: 40.8 kg 43.6 kg    History of present illness:  Pt. with PMH of bipolar disorder, substance abuse, breast cancer, Orthostatic hypotension,  hypothyroidism, chronic diastolic CHF, steroid dependent COPD; admitted on 09/11/2018, presented with complaint of shortness of breath, was found to have COPD exacerbation. -Hospitalization further complicated by severe symptomatic orthostatic hypotension  Hospital Course:   Acute on chronic hypercarbic &hypoxic respiratory failure -Due to COPD exacerbation -Improving with antibiotics, steroids -Uses 3 L O2 at baseline and chronic steroids -discharged home on steroid taper  -Remains stable from a pulmonary standpoint  Presyncope/orthostatic hypotension -orthostatic vital signs were positive through this admission, long history of same -Given IV fluid boluses and started midodrine, now maximized to 10 mg 3 times daily -I also started her on oral Florinef, I suspect she has adrenal insufficiency from chronic steroid use, unable to do cosyntropin stim test due to current steroid use -Antihypertensives discontinued, ambulated with PT,  symptomatically better -diuretics discontinued -Encouraged activity and ambulation today  Bipolar disorder denies SI, HI, or hallucinations- continue Depakote, Prozac, Zyprexa.  -She is somewhat unengaging with interview and exam -Cutdown Zyprexa dose  Substance abuse ongoing cocaine use(smokes cocaine)- states she wants to [place] work consulted. UDS +cocaine -Counseled  Breast cancer Status-post right mastectomy- continue Arimidex  Hypothyroidism Continue Synthroid  Hypertension -Norvasc and diuretics discontinued  Chronic diastolic CHF hypovolemic on admission Echo 11/2017 ef 02% grade 1 diastolic dysfunction.  -Remains euvolemic, off IV fluids, positive orthostatic vitals -see above med changes  7. Body mass index is 13.79 kg/m.  Nutrition Problem: Severe Malnutrition Etiology: chronic illness Interventions: Interventions: Ensure Enlive (each supplement provides 350kcal and 20 grams of protein)  Pressure Injury 06/08/18 Stage II -  Partial thickness loss of dermis presenting as a shallow open ulcer with a red, pink wound bed without slough. (Active)  06/08/18 2000  Location: Sacrum  Location Orientation:   Staging: Stage II -  Partial thickness loss of dermis presenting as a shallow open ulcer with a red, pink wound bed without slough.  Wound Description (Comments):   Present on Admission: Yes    Advance goals of care discussion: full code     Discharge Exam: Vitals:   09/20/18 0037 09/20/18 0735  BP: 138/68 96/75  Pulse: 94 75  Resp: 20 17  Temp: 98.9 F (37.2 C) 98.2 F (36.8 C)  SpO2: 95% 100%    General: AAOx, frail cachectic Cardiovascular: S1S2/RRR Respiratory: poor air movement, no wheezes or ronchi  Discharge Instructions   Discharge Instructions    Diet - low sodium heart healthy   Complete by:  As directed    Diet - low sodium heart healthy   Complete by:  As directed  Increase activity slowly    Complete by:  As directed    Increase activity slowly   Complete by:  As directed      Allergies as of 09/20/2018      Reactions   Levaquin [levofloxacin] Hives      Medication List    STOP taking these medications   amLODipine 5 MG tablet Commonly known as:  NORVASC   diphenhydrAMINE 50 MG capsule Commonly known as:  BENADRYL   furosemide 40 MG tablet Commonly known as:  LASIX     TAKE these medications   albuterol (2.5 MG/3ML) 0.083% nebulizer solution Commonly known as:  PROVENTIL Take 3 mLs (2.5 mg total) by nebulization every 4 (four) hours as needed for wheezing or shortness of breath. What changed:  Another medication with the same name was changed. Make sure you understand how and when to take each.   PROAIR HFA 108 (90 Base) MCG/ACT inhaler Generic drug:  albuterol INHALE TWO PUFFS INTO THE LUNGS EVERY 4 HOURS AS NEEDED FOR WHEEZING OR SHORTNESS OF BREATH What changed:  See the new instructions.   budesonide 0.5 MG/2ML nebulizer solution Commonly known as:  PULMICORT Take 0.5 mg by nebulization 2 (two) times daily.   divalproex 500 MG DR tablet Commonly known as:  DEPAKOTE Take 500 mg by mouth 2 (two) times daily.   famotidine 20 MG tablet Commonly known as:  PEPCID One at bedtime What changed:    how much to take  how to take this  when to take this  additional instructions   feeding supplement (ENSURE ENLIVE) Liqd Take 237 mLs by mouth 3 (three) times daily between meals.   fludrocortisone 0.1 MG tablet Commonly known as:  FLORINEF Take 1 tablet (0.1 mg total) by mouth daily.   FLUoxetine 40 MG capsule Commonly known as:  PROZAC Take 40 mg by mouth daily.   Glycopyrrolate-Formoterol 9-4.8 MCG/ACT Aero Commonly known as:  BEVESPI AEROSPHERE Inhale 2 puffs into the lungs 2 (two) times daily.   levothyroxine 75 MCG tablet Commonly known as:  SYNTHROID, LEVOTHROID Take 1 tablet (75 mcg total) daily by mouth.   midodrine 10 MG  tablet Commonly known as:  PROAMATINE Take 1 tablet (10 mg total) by mouth 3 (three) times daily.   multivitamin with minerals Tabs tablet Take 1 tablet by mouth daily.   OLANZapine zydis 20 MG disintegrating tablet Commonly known as:  ZYPREXA Take 20 mg by mouth at bedtime.   omeprazole 40 MG capsule Commonly known as:  PRILOSEC Take 30-60 min before first meal of the day What changed:    how much to take  how to take this  when to take this   OXYGEN Inhale 2 L into the lungs as needed (for shortness of breath).   predniSONE 20 MG tablet Commonly known as:  DELTASONE Take 1-2 tablets (20-40 mg total) by mouth daily with breakfast. Take 40mg  daily for 2days then 20mg  daily for 2days then 10mg  daily chronically as before What changed:    medication strength  how much to take  additional instructions      Allergies  Allergen Reactions  . Levaquin [Levofloxacin] Hives   Follow-up Information    [facility].   Specialty:  Home Health Services Why:  HHPT Contact information: [place] [address] [address] Follow up.   Why:  rolling walker Contact information: [address] [address] [phone]        [name], MD. Schedule an appointment as soon as possible for a visit in 1 week(s).   Specialty:  Internal Medicine Contact information: [place] [address] [phone]            The results of significant diagnostics from this hospitalization (including imaging, microbiology, ancillary and laboratory) are listed below for reference.    Significant Diagnostic Studies: Dg Chest 2 View  Result Date: 09/11/2018 CLINICAL DATA:  58 year old female with shortness of breath and chest pain. EXAM: CHEST - 2 VIEW COMPARISON:  Chest radiograph dated 06/09/2018 FINDINGS: Emphysema and hyperexpansion of the lungs.  There is no focal consolidation, pleural effusion or pneumothorax. The cardiac silhouette is within normal limits. Right axillary and right chest wall surgical clips. No acute osseous pathology. IMPRESSION: 1. No acute cardiopulmonary process. 2. Emphysema. Electronically Signed   By: Anner Crete M.D.   On: 09/11/2018 23:40   Ct Head Wo Contrast  Result Date: 09/14/2018 CLINICAL DATA:  Abnormal CT scan, follow-up. EXAM: CT HEAD WITHOUT CONTRAST TECHNIQUE: Contiguous axial images were obtained from the base of the skull through the vertex without intravenous contrast. COMPARISON:  09/11/2018 FINDINGS: Brain: The previously seen low-density in the left cerebellar hemisphere is no longer visualized and likely was artifactual. No acute intracranial abnormality. Specifically, no hemorrhage, hydrocephalus, mass lesion, acute infarction, or significant intracranial injury. Vascular: No hyperdense vessel or unexpected calcification. Skull: No acute calvarial abnormality. Sinuses/Orbits: Visualized paranasal sinuses and mastoids clear. Orbital soft tissues unremarkable. Other: None IMPRESSION: No acute intracranial abnormality. Electronically Signed   By: Rolm Baptise M.D.   On: 09/14/2018 07:49   Ct Head Wo Contrast  Result Date: 09/11/2018 CLINICAL DATA:  Poly trauma EXAM: CT HEAD WITHOUT CONTRAST TECHNIQUE: Contiguous axial images were obtained from the base of the skull through the vertex without intravenous contrast. COMPARISON:  None. FINDINGS: Brain: Streak artifacts limit assessment at the skull base. Areas of hypodensity are noted involving the left cerebellar hemisphere more likely to represent artifact motion. Underlying nonhemorrhagic infarct is not entirely excluded but believed less likely. Short-term interval follow-up is recommended therefore. Vascular: No hyperdense vessels. Skull: Intact Sinuses/Orbits: Nonacute Other: None IMPRESSION: Areas of hypodensity involving the left cerebellar  hemisphere more likely to represent artifact are identified given patient motion. Underlying nonhemorrhagic infarct is not entirely excluded but believed less likely. Short-term interval follow-up is recommended. Otherwise negative. Electronically Signed   By: Ashley Royalty M.D.   On: 09/11/2018 23:41    Microbiology: No results found for this or any previous visit (from the past 240 hour(s)).   Labs: Basic Metabolic Panel: No results for input(s): NA, K, CL, CO2, GLUCOSE, BUN, CREATININE, CALCIUM, MG, PHOS in the last 168 hours. Liver Function Tests: No results for input(s): AST, ALT, ALKPHOS, BILITOT, PROT, ALBUMIN in the last 168 hours. No results for input(s): LIPASE, AMYLASE in the last 168 hours. No results for input(s): AMMONIA in the last 168 hours. CBC: No results for input(s): WBC, NEUTROABS, HGB, HCT, MCV, PLT in the last 168 hours. Cardiac Enzymes: No results for input(s): CKTOTAL, CKMB, CKMBINDEX, TROPONINI in the last 168 hours. BNP: BNP (last 3 results) Recent Labs    11/11/17 0911 01/26/18 0458 06/08/18 1413  BNP 1,048.2* 958.4* 35.0    ProBNP (last 3 results) No results for input(s): PROBNP in the last 8760 hours.  CBG: No results for input(s): GLUCAP in the last  168 hours.     Signed:  Domenic Polite MD.  [facility] 10/09/2018, 4:16 PM

## 2018-10-12 ENCOUNTER — Inpatient Hospital Stay: Payer: Medicaid Other | Admitting: Nurse Practitioner

## 2018-10-15 ENCOUNTER — Telehealth: Payer: Self-pay | Admitting: Pulmonary Disease

## 2018-10-15 NOTE — Telephone Encounter (Signed)
lmtcb x1 for pt. 

## 2018-10-16 NOTE — Telephone Encounter (Signed)
Called and spoke with pt who stated she began having problems with SOB and chest tightness yesterday, 10/15/2018. Pt is wanting to have a Rx prednisone called into her pharmacy.  While speaking with pt, I told her that we could move her appt from 10/21/2018 to today, 10/16/2018 if she is still the problems. Per pt, she said the SOB and chest tightness is okay now and she does not want to move her appt up but still would like to have pred Rx called in.  Aaron Edelman, please advise on this for pt. Thanks!

## 2018-10-16 NOTE — Telephone Encounter (Signed)
See telephone note on December/20/19  As listed below:  I will refill patients chronic prednisone for 30 days no refills for patient to make it to her next OV with our office.  Please place the order.  Ensure patient has her Bevespi and has been using it.  Pt needs to follow up with her PCP, if she would like referral for a new pcp then we can provide that referral.   Wyn Quaker FNP    Patient needs office visit before refills of medication.  Patient needs to maintain office visit schedule  Wyn Quaker, FNP

## 2018-10-16 NOTE — Telephone Encounter (Signed)
Left message for patient to call back  

## 2018-10-20 NOTE — Telephone Encounter (Signed)
Pt has an appt with [place] tomorrow. Will close encounter.

## 2018-10-21 ENCOUNTER — Inpatient Hospital Stay: Payer: Medicaid Other | Admitting: Nurse Practitioner

## 2018-10-23 ENCOUNTER — Telehealth: Payer: Self-pay | Admitting: Pulmonary Disease

## 2018-10-23 NOTE — Telephone Encounter (Signed)
I have addressed this multiple times.  Patient continues to no-show on [place].  I cannot refill her prednisone at this time.  You can route to Dr. Elsworth Soho as it is his patient as the patient continues to no-show.  This was explained to the patient when I refilled it with no refills.  Please asked the patient why she continues to no-show and reschedule appointments.  What is the difficulty of coming into our office?  Wyn Quaker, FNP

## 2018-10-23 NOTE — Telephone Encounter (Signed)
Spoke with the pt and advised that we will not be refilling her pred without an office visit  She states she has been missing her appts here due to lack of transportation  She states that she is aware of her appt with Tonya for 10/30/18 and plans to keep this appt

## 2018-10-23 NOTE — Telephone Encounter (Signed)
Called and spoke with Patient.  Patient is requesting Prednisone refill.  Patient is scheduled for OV 10/30/18, with Kenney Houseman, NP.  Last refill was 09/25/18, #30, no refills.  Per previous note 09/25/18- I will refill patients chronic prednisone for 30 days no refills for patient to make it to her next OV with our office.   Please place the order.   Ensure patient has her Bevespi and has been using it.   Pt needs to follow up with her PCP, if she would like referral for a new pcp then we can provide that referral.    Wyn Quaker FNP   Wyn Quaker, NP, please advise on refill

## 2018-10-27 ENCOUNTER — Other Ambulatory Visit: Payer: Self-pay | Admitting: Hematology

## 2018-10-27 DIAGNOSIS — C50411 Malignant neoplasm of upper-outer quadrant of right female breast: Secondary | ICD-10-CM

## 2018-10-30 ENCOUNTER — Ambulatory Visit (INDEPENDENT_AMBULATORY_CARE_PROVIDER_SITE_OTHER): Payer: Medicaid Other | Admitting: Nurse Practitioner

## 2018-10-30 ENCOUNTER — Encounter: Payer: Self-pay | Admitting: Nurse Practitioner

## 2018-10-30 VITALS — BP 100/60 | HR 86 | Ht 70.0 in | Wt 97.0 lb

## 2018-10-30 DIAGNOSIS — J9611 Chronic respiratory failure with hypoxia: Secondary | ICD-10-CM

## 2018-10-30 DIAGNOSIS — F17200 Nicotine dependence, unspecified, uncomplicated: Secondary | ICD-10-CM

## 2018-10-30 DIAGNOSIS — F1910 Other psychoactive substance abuse, uncomplicated: Secondary | ICD-10-CM | POA: Diagnosis not present

## 2018-10-30 DIAGNOSIS — J449 Chronic obstructive pulmonary disease, unspecified: Secondary | ICD-10-CM | POA: Diagnosis not present

## 2018-10-30 MED ORDER — GLYCOPYRROLATE-FORMOTEROL 9-4.8 MCG/ACT IN AERO: 2.0000 | INHALATION_SPRAY | Freq: Two times a day (BID) | RESPIRATORY_TRACT | 0 refills | Status: DC

## 2018-10-30 MED ORDER — ALBUTEROL SULFATE HFA 108 (90 BASE) MCG/ACT IN AERS: INHALATION_SPRAY | RESPIRATORY_TRACT | 3 refills | Status: DC

## 2018-10-30 MED ORDER — FLUDROCORTISONE ACETATE 0.1 MG PO TABS: 0.1000 mg | ORAL_TABLET | Freq: Every day | ORAL | 0 refills | Status: DC

## 2018-10-30 MED ORDER — PREDNISONE 10 MG PO TABS: 10.0000 mg | ORAL_TABLET | Freq: Every day | ORAL | 0 refills | Status: DC

## 2018-10-30 MED ORDER — ALBUTEROL SULFATE (2.5 MG/3ML) 0.083% IN NEBU: 2.5000 mg | INHALATION_SOLUTION | RESPIRATORY_TRACT | 2 refills | Status: DC | PRN

## 2018-10-30 NOTE — Assessment & Plan Note (Signed)
Patient has been stable since hospital discharge.  She is currently out of her medications.  We will refill but with this being and give sample.  Will refill prednisone daily.  Will refill albuterol.  Patient Instructions  Continue Bevespi 2 puffs twice daily - sample given Continue Continue Proventil Continue prednisone 10 mg daily Continue O2 at 2L [place] continuously Follow up with Dr. Elsworth Soho in 3 months or sooner if needed

## 2018-10-30 NOTE — Assessment & Plan Note (Signed)
Please quit illicit drug use including cocaine

## 2018-10-30 NOTE — Assessment & Plan Note (Signed)
Continue O2 at 2 L [place] continuously

## 2018-10-30 NOTE — Patient Instructions (Addendum)
Continue Bevespi 2 puffs twice daily - sample given Continue Continue Proventil Continue prednisone 10 mg daily Continue O2 at 2L [place] continuously Follow up with Dr. Elsworth Soho in 3 months or sooner if needed
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
## 2018-10-30 NOTE — Assessment & Plan Note (Signed)
Smoking cessation instruction/counseling given:  counseled patient on the dangers of tobacco use, advised patient to stop smoking, and reviewed strategies to maximize success 

## 2018-11-08 ENCOUNTER — Encounter (HOSPITAL_COMMUNITY): Payer: Self-pay | Admitting: Emergency Medicine

## 2018-11-08 ENCOUNTER — Emergency Department (HOSPITAL_COMMUNITY): Payer: Medicaid Other

## 2018-11-08 ENCOUNTER — Inpatient Hospital Stay (HOSPITAL_COMMUNITY): Payer: Medicaid Other

## 2018-11-08 ENCOUNTER — Inpatient Hospital Stay (HOSPITAL_COMMUNITY)
Admission: EM | Admit: 2018-11-08 | Discharge: 2018-11-27 | DRG: 004 | Disposition: A | Discharge status: Rehabilitation Facility | Payer: Medicaid Other | Attending: Family Medicine | Admitting: Family Medicine

## 2018-11-08 DIAGNOSIS — F1410 Cocaine abuse, uncomplicated: Secondary | ICD-10-CM | POA: Diagnosis present

## 2018-11-08 DIAGNOSIS — F17210 Nicotine dependence, cigarettes, uncomplicated: Secondary | ICD-10-CM | POA: Diagnosis present

## 2018-11-08 DIAGNOSIS — J441 Chronic obstructive pulmonary disease with (acute) exacerbation: Secondary | ICD-10-CM

## 2018-11-08 DIAGNOSIS — Z881 Allergy status to other antibiotic agents status: Secondary | ICD-10-CM

## 2018-11-08 DIAGNOSIS — E43 Unspecified severe protein-calorie malnutrition: Secondary | ICD-10-CM | POA: Diagnosis present

## 2018-11-08 DIAGNOSIS — R1310 Dysphagia, unspecified: Secondary | ICD-10-CM | POA: Diagnosis present

## 2018-11-08 DIAGNOSIS — J9611 Chronic respiratory failure with hypoxia: Secondary | ICD-10-CM | POA: Diagnosis not present

## 2018-11-08 DIAGNOSIS — R64 Cachexia: Secondary | ICD-10-CM | POA: Diagnosis present

## 2018-11-08 DIAGNOSIS — E039 Hypothyroidism, unspecified: Secondary | ICD-10-CM | POA: Diagnosis not present

## 2018-11-08 DIAGNOSIS — I9589 Other hypotension: Secondary | ICD-10-CM | POA: Diagnosis present

## 2018-11-08 DIAGNOSIS — Z9981 Dependence on supplemental oxygen: Secondary | ICD-10-CM

## 2018-11-08 DIAGNOSIS — R0902 Hypoxemia: Secondary | ICD-10-CM

## 2018-11-08 DIAGNOSIS — Z8049 Family history of malignant neoplasm of other genital organs: Secondary | ICD-10-CM

## 2018-11-08 DIAGNOSIS — I10 Essential (primary) hypertension: Secondary | ICD-10-CM | POA: Diagnosis present

## 2018-11-08 DIAGNOSIS — Z7952 Long term (current) use of systemic steroids: Secondary | ICD-10-CM

## 2018-11-08 DIAGNOSIS — F4310 Post-traumatic stress disorder, unspecified: Secondary | ICD-10-CM | POA: Diagnosis present

## 2018-11-08 DIAGNOSIS — Z4659 Encounter for fitting and adjustment of other gastrointestinal appliance and device: Secondary | ICD-10-CM

## 2018-11-08 DIAGNOSIS — Z930 Tracheostomy status: Secondary | ICD-10-CM | POA: Diagnosis not present

## 2018-11-08 DIAGNOSIS — R569 Unspecified convulsions: Secondary | ICD-10-CM | POA: Diagnosis not present

## 2018-11-08 DIAGNOSIS — F17200 Nicotine dependence, unspecified, uncomplicated: Secondary | ICD-10-CM

## 2018-11-08 DIAGNOSIS — Z430 Encounter for attention to tracheostomy: Secondary | ICD-10-CM | POA: Diagnosis not present

## 2018-11-08 DIAGNOSIS — J9811 Atelectasis: Secondary | ICD-10-CM | POA: Diagnosis not present

## 2018-11-08 DIAGNOSIS — Z9911 Dependence on respirator [ventilator] status: Secondary | ICD-10-CM | POA: Diagnosis not present

## 2018-11-08 DIAGNOSIS — G92 Toxic encephalopathy: Secondary | ICD-10-CM | POA: Diagnosis not present

## 2018-11-08 DIAGNOSIS — Z79811 Long term (current) use of aromatase inhibitors: Secondary | ICD-10-CM

## 2018-11-08 DIAGNOSIS — Z8249 Family history of ischemic heart disease and other diseases of the circulatory system: Secondary | ICD-10-CM

## 2018-11-08 DIAGNOSIS — J449 Chronic obstructive pulmonary disease, unspecified: Secondary | ICD-10-CM | POA: Diagnosis not present

## 2018-11-08 DIAGNOSIS — J9602 Acute respiratory failure with hypercapnia: Secondary | ICD-10-CM | POA: Diagnosis not present

## 2018-11-08 DIAGNOSIS — R7309 Other abnormal glucose: Secondary | ICD-10-CM | POA: Diagnosis not present

## 2018-11-08 DIAGNOSIS — Z803 Family history of malignant neoplasm of breast: Secondary | ICD-10-CM

## 2018-11-08 DIAGNOSIS — A599 Trichomoniasis, unspecified: Secondary | ICD-10-CM | POA: Diagnosis present

## 2018-11-08 DIAGNOSIS — Z9011 Acquired absence of right breast and nipple: Secondary | ICD-10-CM

## 2018-11-08 DIAGNOSIS — I82613 Acute embolism and thrombosis of superficial veins of upper extremity, bilateral: Secondary | ICD-10-CM | POA: Diagnosis present

## 2018-11-08 DIAGNOSIS — J9690 Respiratory failure, unspecified, unspecified whether with hypoxia or hypercapnia: Secondary | ICD-10-CM

## 2018-11-08 DIAGNOSIS — M954 Acquired deformity of chest and rib: Secondary | ICD-10-CM | POA: Diagnosis present

## 2018-11-08 DIAGNOSIS — R5381 Other malaise: Secondary | ICD-10-CM | POA: Diagnosis not present

## 2018-11-08 DIAGNOSIS — F319 Bipolar disorder, unspecified: Secondary | ICD-10-CM | POA: Diagnosis present

## 2018-11-08 DIAGNOSIS — Z681 Body mass index (BMI) 19 or less, adult: Secondary | ICD-10-CM | POA: Diagnosis not present

## 2018-11-08 DIAGNOSIS — D72828 Other elevated white blood cell count: Secondary | ICD-10-CM | POA: Diagnosis not present

## 2018-11-08 DIAGNOSIS — I950 Idiopathic hypotension: Secondary | ICD-10-CM | POA: Diagnosis not present

## 2018-11-08 DIAGNOSIS — R0600 Dyspnea, unspecified: Secondary | ICD-10-CM | POA: Diagnosis not present

## 2018-11-08 DIAGNOSIS — Z79899 Other long term (current) drug therapy: Secondary | ICD-10-CM

## 2018-11-08 DIAGNOSIS — Z79890 Hormone replacement therapy (postmenopausal): Secondary | ICD-10-CM | POA: Diagnosis not present

## 2018-11-08 DIAGNOSIS — F209 Schizophrenia, unspecified: Secondary | ICD-10-CM | POA: Diagnosis present

## 2018-11-08 DIAGNOSIS — F3170 Bipolar disorder, currently in remission, most recent episode unspecified: Secondary | ICD-10-CM | POA: Diagnosis not present

## 2018-11-08 DIAGNOSIS — E875 Hyperkalemia: Secondary | ICD-10-CM | POA: Diagnosis not present

## 2018-11-08 DIAGNOSIS — F419 Anxiety disorder, unspecified: Secondary | ICD-10-CM | POA: Diagnosis present

## 2018-11-08 DIAGNOSIS — R1110 Vomiting, unspecified: Secondary | ICD-10-CM

## 2018-11-08 DIAGNOSIS — Z7401 Bed confinement status: Secondary | ICD-10-CM

## 2018-11-08 DIAGNOSIS — M62561 Muscle wasting and atrophy, not elsewhere classified, right lower leg: Secondary | ICD-10-CM | POA: Diagnosis present

## 2018-11-08 DIAGNOSIS — C50911 Malignant neoplasm of unspecified site of right female breast: Secondary | ICD-10-CM | POA: Diagnosis present

## 2018-11-08 DIAGNOSIS — K567 Ileus, unspecified: Secondary | ICD-10-CM | POA: Diagnosis not present

## 2018-11-08 DIAGNOSIS — J9601 Acute respiratory failure with hypoxia: Secondary | ICD-10-CM

## 2018-11-08 DIAGNOSIS — G40909 Epilepsy, unspecified, not intractable, without status epilepticus: Secondary | ICD-10-CM | POA: Diagnosis not present

## 2018-11-08 DIAGNOSIS — J9622 Acute and chronic respiratory failure with hypercapnia: Secondary | ICD-10-CM | POA: Diagnosis not present

## 2018-11-08 DIAGNOSIS — M62562 Muscle wasting and atrophy, not elsewhere classified, left lower leg: Secondary | ICD-10-CM | POA: Diagnosis present

## 2018-11-08 DIAGNOSIS — E162 Hypoglycemia, unspecified: Secondary | ICD-10-CM | POA: Diagnosis not present

## 2018-11-08 DIAGNOSIS — D62 Acute posthemorrhagic anemia: Secondary | ICD-10-CM | POA: Diagnosis not present

## 2018-11-08 DIAGNOSIS — J9621 Acute and chronic respiratory failure with hypoxia: Secondary | ICD-10-CM | POA: Diagnosis not present

## 2018-11-08 DIAGNOSIS — Z720 Tobacco use: Secondary | ICD-10-CM | POA: Diagnosis present

## 2018-11-08 DIAGNOSIS — Z853 Personal history of malignant neoplasm of breast: Secondary | ICD-10-CM | POA: Diagnosis not present

## 2018-11-08 DIAGNOSIS — R1312 Dysphagia, oropharyngeal phase: Secondary | ICD-10-CM | POA: Diagnosis not present

## 2018-11-08 DIAGNOSIS — G9341 Metabolic encephalopathy: Secondary | ICD-10-CM | POA: Diagnosis not present

## 2018-11-08 DIAGNOSIS — Z781 Physical restraint status: Secondary | ICD-10-CM

## 2018-11-08 DIAGNOSIS — F1910 Other psychoactive substance abuse, uncomplicated: Secondary | ICD-10-CM | POA: Diagnosis not present

## 2018-11-08 DIAGNOSIS — Z87898 Personal history of other specified conditions: Secondary | ICD-10-CM | POA: Diagnosis not present

## 2018-11-08 DIAGNOSIS — J9600 Acute respiratory failure, unspecified whether with hypoxia or hypercapnia: Secondary | ICD-10-CM

## 2018-11-08 DIAGNOSIS — Z9289 Personal history of other medical treatment: Secondary | ICD-10-CM

## 2018-11-08 DIAGNOSIS — D696 Thrombocytopenia, unspecified: Secondary | ICD-10-CM | POA: Diagnosis not present

## 2018-11-08 DIAGNOSIS — G9340 Encephalopathy, unspecified: Secondary | ICD-10-CM | POA: Diagnosis not present

## 2018-11-08 LAB — RAPID URINE DRUG SCREEN, HOSP PERFORMED
Amphetamines: NOT DETECTED
Barbiturates: NOT DETECTED
Benzodiazepines: NOT DETECTED
COCAINE: POSITIVE — AB
Opiates: NOT DETECTED
Tetrahydrocannabinol: NOT DETECTED

## 2018-11-08 LAB — COMPREHENSIVE METABOLIC PANEL
ALT: 14 U/L (ref 0–44)
AST: 22 U/L (ref 15–41)
Albumin: 3.5 g/dL (ref 3.5–5.0)
Alkaline Phosphatase: 65 U/L (ref 38–126)
Anion gap: 16 — ABNORMAL HIGH (ref 5–15)
BUN: 15 mg/dL (ref 6–20)
CO2: 25 mmol/L (ref 22–32)
CREATININE: 0.63 mg/dL (ref 0.44–1.00)
Calcium: 9 mg/dL (ref 8.9–10.3)
Chloride: 101 mmol/L (ref 98–111)
GFR calc Af Amer: >60 mL/min (ref >60)
GFR calc non Af Amer: >60 mL/min (ref >60)
Glucose, Bld: 147 mg/dL — ABNORMAL HIGH (ref 70–99)
Potassium: 4.8 mmol/L (ref 3.5–5.1)
Sodium: 142 mmol/L (ref 135–145)
Total Bilirubin: 0.8 mg/dL (ref 0.3–1.2)
Total Protein: 6.2 g/dL — ABNORMAL LOW (ref 6.5–8.1)

## 2018-11-08 LAB — CBC WITH DIFFERENTIAL/PLATELET
Abs Immature Granulocytes: 0.02 10*3/uL (ref 0.00–0.07)
Abs Immature Granulocytes: 0.02 10*3/uL (ref 0.00–0.07)
BASOS ABS: 0.1 10*3/uL (ref 0.0–0.1)
Basophils Absolute: 0 10*3/uL (ref 0.0–0.1)
Basophils Relative: 1 %
Basophils Relative: 0 %
EOS ABS: 0 10*3/uL (ref 0.0–0.5)
Eosinophils Absolute: 0 10*3/uL (ref 0.0–0.5)
Eosinophils Relative: 0 %
Eosinophils Relative: 0 %
HCT: 41.9 % (ref 36.0–46.0)
HEMATOCRIT: 44.8 % (ref 36.0–46.0)
Hemoglobin: 12.2 g/dL (ref 12.0–15.0)
Hemoglobin: 12 g/dL (ref 12.0–15.0)
IMMATURE GRANULOCYTES: 0 %
Immature Granulocytes: 0 %
Lymphocytes Relative: 13 %
Lymphocytes Relative: 12 %
Lymphs Abs: 1.3 10*3/uL (ref 0.7–4.0)
Lymphs Abs: 0.6 10*3/uL — ABNORMAL LOW (ref 0.7–4.0)
MCH: 26.7 pg (ref 26.0–34.0)
MCH: 27 pg (ref 26.0–34.0)
MCHC: 27.2 g/dL — ABNORMAL LOW (ref 30.0–36.0)
MCHC: 28.6 g/dL — ABNORMAL LOW (ref 30.0–36.0)
MCV: 98 fL (ref 80.0–100.0)
MCV: 94.4 fL (ref 80.0–100.0)
Monocytes Absolute: 0.8 10*3/uL (ref 0.1–1.0)
Monocytes Absolute: 0.3 10*3/uL (ref 0.1–1.0)
Monocytes Relative: 9 %
Monocytes Relative: 6 %
Neutro Abs: 7.8 10*3/uL — ABNORMAL HIGH (ref 1.7–7.7)
Neutro Abs: 4.2 10*3/uL (ref 1.7–7.7)
Neutrophils Relative %: 77 %
Neutrophils Relative %: 82 %
Platelets: 217 10*3/uL (ref 150–400)
Platelets: 198 10*3/uL (ref 150–400)
RBC: 4.57 MIL/uL (ref 3.87–5.11)
RBC: 4.44 MIL/uL (ref 3.87–5.11)
RDW: 13.7 % (ref 11.5–15.5)
RDW: 14.1 % (ref 11.5–15.5)
WBC: 9.9 10*3/uL (ref 4.0–10.5)
WBC: 5.1 10*3/uL (ref 4.0–10.5)
nRBC: 0 % (ref 0.0–0.2)
nRBC: 0 % (ref 0.0–0.2)

## 2018-11-08 LAB — POCT I-STAT 7, (LYTES, BLD GAS, ICA,H+H)
ACID-BASE EXCESS: 17 mmol/L — AB (ref 0.0–2.0)
ACID-BASE EXCESS: 18 mmol/L — AB (ref 0.0–2.0)
Acid-Base Excess: 17 mmol/L — ABNORMAL HIGH (ref 0.0–2.0)
Acid-Base Excess: 10 mmol/L — ABNORMAL HIGH (ref 0.0–2.0)
BICARBONATE: 34.7 mmol/L — AB (ref 20.0–28.0)
Bicarbonate: 47.8 mmol/L — ABNORMAL HIGH (ref 20.0–28.0)
Bicarbonate: 49 mmol/L — ABNORMAL HIGH (ref 20.0–28.0)
Bicarbonate: 44.3 mmol/L — ABNORMAL HIGH (ref 20.0–28.0)
CALCIUM ION: 1.15 mmol/L (ref 1.15–1.40)
CALCIUM ION: 1.16 mmol/L (ref 1.15–1.40)
Calcium, Ion: 1.25 mmol/L (ref 1.15–1.40)
Calcium, Ion: 1.24 mmol/L (ref 1.15–1.40)
HCT: 39 % (ref 36.0–46.0)
HCT: 40 % (ref 36.0–46.0)
HCT: 37 % (ref 36.0–46.0)
HEMATOCRIT: 39 % (ref 36.0–46.0)
Hemoglobin: 13.3 g/dL (ref 12.0–15.0)
Hemoglobin: 13.3 g/dL (ref 12.0–15.0)
Hemoglobin: 13.6 g/dL (ref 12.0–15.0)
Hemoglobin: 12.6 g/dL (ref 12.0–15.0)
O2 Saturation: 83 %
O2 Saturation: 92 %
O2 Saturation: 100 %
O2 Saturation: 100 %
PO2 ART: 74 mmHg — AB (ref 83.0–108.0)
Patient temperature: 98.3
Patient temperature: 37.1
Potassium: 3.8 mmol/L (ref 3.5–5.1)
Potassium: 3.8 mmol/L (ref 3.5–5.1)
Potassium: 4 mmol/L (ref 3.5–5.1)
Potassium: 4.4 mmol/L (ref 3.5–5.1)
Sodium: 140 mmol/L (ref 135–145)
Sodium: 139 mmol/L (ref 135–145)
Sodium: 138 mmol/L (ref 135–145)
Sodium: 139 mmol/L (ref 135–145)
TCO2: >50 mmol/L — ABNORMAL HIGH (ref 22–32)
TCO2: >50 mmol/L — ABNORMAL HIGH (ref 22–32)
TCO2: 46 mmol/L — ABNORMAL HIGH (ref 22–32)
TCO2: 36 mmol/L — ABNORMAL HIGH (ref 22–32)
pCO2 arterial: 95.7 mmHg (ref 32.0–48.0)
pCO2 arterial: 93.6 mmHg (ref 32.0–48.0)
pCO2 arterial: 63.1 mmHg — ABNORMAL HIGH (ref 32.0–48.0)
pCO2 arterial: 44.7 mmHg (ref 32.0–48.0)
pH, Arterial: 7.306 — ABNORMAL LOW (ref 7.350–7.450)
pH, Arterial: 7.327 — ABNORMAL LOW (ref 7.350–7.450)
pH, Arterial: 7.455 — ABNORMAL HIGH (ref 7.350–7.450)
pH, Arterial: 7.499 — ABNORMAL HIGH (ref 7.350–7.450)
pO2, Arterial: 56 mmHg — ABNORMAL LOW (ref 83.0–108.0)
pO2, Arterial: 331 mmHg — ABNORMAL HIGH (ref 83.0–108.0)
pO2, Arterial: 197 mmHg — ABNORMAL HIGH (ref 83.0–108.0)

## 2018-11-08 LAB — URINALYSIS, ROUTINE W REFLEX MICROSCOPIC
Bilirubin Urine: NEGATIVE
Glucose, UA: NEGATIVE mg/dL
Ketones, ur: NEGATIVE mg/dL
Leukocytes, UA: NEGATIVE
Nitrite: POSITIVE — AB
Protein, ur: 30 mg/dL — AB
Specific Gravity, Urine: 1.024 (ref 1.005–1.030)
pH: 7 (ref 5.0–8.0)

## 2018-11-08 LAB — MRSA PCR SCREENING: MRSA by PCR: NEGATIVE

## 2018-11-08 LAB — BASIC METABOLIC PANEL
Anion gap: 9 (ref 5–15)
BUN: 11 mg/dL (ref 6–20)
CO2: 41 mmol/L — AB (ref 22–32)
Calcium: 9.1 mg/dL (ref 8.9–10.3)
Chloride: 94 mmol/L — ABNORMAL LOW (ref 98–111)
Creatinine, Ser: 0.47 mg/dL (ref 0.44–1.00)
GFR calc Af Amer: >60 mL/min (ref >60)
GFR calc non Af Amer: >60 mL/min (ref >60)
Glucose, Bld: 156 mg/dL — ABNORMAL HIGH (ref 70–99)
Potassium: 3.8 mmol/L (ref 3.5–5.1)
Sodium: 144 mmol/L (ref 135–145)

## 2018-11-08 LAB — LACTIC ACID, PLASMA
Lactic Acid, Venous: 3.5 mmol/L (ref 0.5–1.9)
Lactic Acid, Venous: 2.9 mmol/L (ref 0.5–1.9)

## 2018-11-08 LAB — BRAIN NATRIURETIC PEPTIDE
B Natriuretic Peptide: 32.2 pg/mL (ref 0.0–100.0)
B Natriuretic Peptide: 29.5 pg/mL (ref 0.0–100.0)

## 2018-11-08 LAB — VALPROIC ACID LEVEL: Valproic Acid Lvl: <10 ug/mL — ABNORMAL LOW (ref 50.0–100.0)

## 2018-11-08 LAB — GLUCOSE, CAPILLARY: Glucose-Capillary: 149 mg/dL — ABNORMAL HIGH (ref 70–99)

## 2018-11-08 LAB — PHOSPHORUS: Phosphorus: 2.8 mg/dL (ref 2.5–4.6)

## 2018-11-08 LAB — BLOOD GAS, ARTERIAL
Acid-Base Excess: 16.7 mmol/L — ABNORMAL HIGH (ref 0.0–2.0)
Bicarbonate: 45.1 mmol/L — ABNORMAL HIGH (ref 20.0–28.0)
Drawn by: 245131
O2 Content: 2 L/min
O2 SAT: 98.4 %
Patient temperature: 98.6
pH, Arterial: 7.199 — CL (ref 7.350–7.450)
pO2, Arterial: 143 mmHg (ref 83.0–108.0)

## 2018-11-08 LAB — COOXEMETRY PANEL
Carboxyhemoglobin: 10.2 % (ref 0.5–1.5)
Methemoglobin: 1.8 % — ABNORMAL HIGH (ref 0.0–1.5)
O2 Saturation: 98.4 %
Total hemoglobin: 11.7 g/dL — ABNORMAL LOW (ref 12.0–16.0)

## 2018-11-08 LAB — TROPONIN I
Troponin I: <0.03 ng/mL (ref <0.03)
Troponin I: <0.03 ng/mL (ref <0.03)

## 2018-11-08 LAB — ETHANOL: Alcohol, Ethyl (B): <10 mg/dL (ref <10)

## 2018-11-08 LAB — TSH: TSH: 0.407 u[IU]/mL (ref 0.350–4.500)

## 2018-11-08 LAB — MAGNESIUM: Magnesium: 2.1 mg/dL (ref 1.7–2.4)

## 2018-11-08 LAB — T4, FREE: Free T4: 0.8 ng/dL — ABNORMAL LOW (ref 0.82–1.77)

## 2018-11-08 MED ORDER — ROCURONIUM BROMIDE 50 MG/5ML IV SOLN
INTRAVENOUS | Status: AC | PRN
  Administered 2018-11-08: 44 mg via INTRAVENOUS

## 2018-11-08 MED ORDER — IOPAMIDOL (ISOVUE-370) INJECTION 76%
50.0000 mL | Freq: Once | INTRAVENOUS | Status: AC | PRN
  Administered 2018-11-08: 50 mL via INTRAVENOUS

## 2018-11-08 MED ORDER — ARFORMOTEROL TARTRATE 15 MCG/2ML IN NEBU
15.0000 ug | INHALATION_SOLUTION | Freq: Two times a day (BID) | RESPIRATORY_TRACT | Status: DC
  Administered 2018-11-08 – 2018-11-27 (×38): 15 ug via RESPIRATORY_TRACT
  Filled 2018-11-08 (×39): qty 2

## 2018-11-08 MED ORDER — DEXMEDETOMIDINE HCL IN NACL 400 MCG/100ML IV SOLN
0.4000 ug/kg/h | INTRAVENOUS | Status: DC
  Administered 2018-11-08: 1 ug/kg/h via INTRAVENOUS
  Administered 2018-11-09: 0.7 ug/kg/h via INTRAVENOUS
  Administered 2018-11-09: 1 ug/kg/h via INTRAVENOUS
  Administered 2018-11-09 – 2018-11-10 (×3): 1.2 ug/kg/h via INTRAVENOUS
  Administered 2018-11-10 – 2018-11-11 (×2): 1 ug/kg/h via INTRAVENOUS
  Administered 2018-11-11: 1.1 ug/kg/h via INTRAVENOUS
  Administered 2018-11-12 (×4): 1.2 ug/kg/h via INTRAVENOUS
  Filled 2018-11-08 (×13): qty 100

## 2018-11-08 MED ORDER — ALBUTEROL (5 MG/ML) CONTINUOUS INHALATION SOLN
10.0000 mg/h | INHALATION_SOLUTION | Freq: Once | RESPIRATORY_TRACT | Status: AC
  Administered 2018-11-08: 10 mg/h via RESPIRATORY_TRACT
  Filled 2018-11-08: qty 20

## 2018-11-08 MED ORDER — ETOMIDATE 2 MG/ML IV SOLN: 0.3000 mg/kg | Freq: Once | INTRAVENOUS | Status: DC

## 2018-11-08 MED ORDER — SODIUM CHLORIDE 0.9 % IV SOLN
2.0000 g | INTRAVENOUS | Status: DC
  Filled 2018-11-08: qty 20

## 2018-11-08 MED ORDER — DEXMEDETOMIDINE HCL IN NACL 200 MCG/50ML IV SOLN
0.4000 ug/kg/h | INTRAVENOUS | Status: DC
  Administered 2018-11-08: 0.4 ug/kg/h via INTRAVENOUS
  Filled 2018-11-08: qty 50

## 2018-11-08 MED ORDER — FENTANYL CITRATE (PF) 100 MCG/2ML IJ SOLN
100.0000 ug | Freq: Once | INTRAMUSCULAR | Status: AC
  Administered 2018-11-08: 100 ug via INTRAVENOUS
  Filled 2018-11-08: qty 2

## 2018-11-08 MED ORDER — MAGNESIUM SULFATE 2 GM/50ML IV SOLN
2.0000 g | Freq: Once | INTRAVENOUS | Status: AC
  Administered 2018-11-08: 2 g via INTRAVENOUS
  Filled 2018-11-08: qty 50

## 2018-11-08 MED ORDER — METHYLPREDNISOLONE SODIUM SUCC 125 MG IJ SOLR
80.0000 mg | Freq: Four times a day (QID) | INTRAMUSCULAR | Status: DC
  Administered 2018-11-08 – 2018-11-12 (×17): 80 mg via INTRAVENOUS
  Filled 2018-11-08 (×17): qty 2

## 2018-11-08 MED ORDER — BUDESONIDE 0.5 MG/2ML IN SUSP
0.5000 mg | Freq: Two times a day (BID) | RESPIRATORY_TRACT | Status: DC
  Administered 2018-11-08 – 2018-11-27 (×38): 0.5 mg via RESPIRATORY_TRACT
  Filled 2018-11-08 (×39): qty 2

## 2018-11-08 MED ORDER — OSELTAMIVIR PHOSPHATE 6 MG/ML PO SUSR
75.0000 mg | Freq: Two times a day (BID) | ORAL | Status: DC
  Administered 2018-11-08: 75 mg via ORAL
  Filled 2018-11-08 (×2): qty 12.5

## 2018-11-08 MED ORDER — LORAZEPAM 2 MG/ML IJ SOLN
INTRAMUSCULAR | Status: AC
  Filled 2018-11-08: qty 1

## 2018-11-08 MED ORDER — PROPOFOL 1000 MG/100ML IV EMUL
INTRAVENOUS | Status: AC
  Filled 2018-11-08: qty 100

## 2018-11-08 MED ORDER — PHENYLEPHRINE 40 MCG/ML (10ML) SYRINGE FOR IV PUSH (FOR BLOOD PRESSURE SUPPORT): 80.0000 ug | PREFILLED_SYRINGE | Freq: Once | INTRAVENOUS | Status: DC

## 2018-11-08 MED ORDER — SODIUM CHLORIDE 0.9 % IV SOLN
INTRAVENOUS | Status: DC | PRN
  Administered 2018-11-08 – 2018-11-14 (×2): 1000 mL via INTRAVENOUS
  Administered 2018-11-15 – 2018-11-17 (×2): 500 mL via INTRAVENOUS
  Administered 2018-11-21 – 2018-11-22 (×2): 250 mL via INTRAVENOUS

## 2018-11-08 MED ORDER — VITAL HIGH PROTEIN PO LIQD
1000.0000 mL | ORAL | Status: DC
  Administered 2018-11-08: 1000 mL

## 2018-11-08 MED ORDER — SODIUM CHLORIDE 0.9 % IV BOLUS
1000.0000 mL | Freq: Once | INTRAVENOUS | Status: AC
  Administered 2018-11-08: 1000 mL via INTRAVENOUS

## 2018-11-08 MED ORDER — PRO-STAT SUGAR FREE PO LIQD
30.0000 mL | Freq: Two times a day (BID) | ORAL | Status: DC
  Administered 2018-11-08 – 2018-11-09 (×2): 30 mL
  Filled 2018-11-08 (×2): qty 30

## 2018-11-08 MED ORDER — SODIUM CHLORIDE 0.9 % IV BOLUS: 1000.0000 mL | Freq: Once | INTRAVENOUS | Status: DC

## 2018-11-08 MED ORDER — ALBUTEROL SULFATE (2.5 MG/3ML) 0.083% IN NEBU: 2.5000 mg | INHALATION_SOLUTION | RESPIRATORY_TRACT | Status: DC | PRN

## 2018-11-08 MED ORDER — NOREPINEPHRINE-SODIUM CHLORIDE 4-0.9 MG/250ML-% IV SOLN
INTRAVENOUS | Status: AC
  Administered 2018-11-08: 5 ug/min via INTRAVENOUS
  Filled 2018-11-08: qty 250

## 2018-11-08 MED ORDER — PHENYLEPHRINE 40 MCG/ML (10ML) SYRINGE FOR IV PUSH (FOR BLOOD PRESSURE SUPPORT)
PREFILLED_SYRINGE | INTRAVENOUS | Status: AC
  Filled 2018-11-08: qty 10

## 2018-11-08 MED ORDER — CHLORHEXIDINE GLUCONATE 0.12% ORAL RINSE (MEDLINE KIT)
15.0000 mL | Freq: Two times a day (BID) | OROMUCOSAL | Status: DC
  Administered 2018-11-08 – 2018-11-25 (×35): 15 mL via OROMUCOSAL

## 2018-11-08 MED ORDER — NALOXONE HCL 2 MG/2ML IJ SOSY
2.0000 mg | PREFILLED_SYRINGE | Freq: Once | INTRAMUSCULAR | Status: AC
  Administered 2018-11-08: 2 mg via INTRAVENOUS
  Filled 2018-11-08: qty 2

## 2018-11-08 MED ORDER — IPRATROPIUM-ALBUTEROL 0.5-2.5 (3) MG/3ML IN SOLN
3.0000 mL | Freq: Four times a day (QID) | RESPIRATORY_TRACT | Status: DC
  Administered 2018-11-08 – 2018-11-27 (×74): 3 mL via RESPIRATORY_TRACT
  Filled 2018-11-08 (×73): qty 3

## 2018-11-08 MED ORDER — NALOXONE HCL 4 MG/10ML IJ SOLN
0.5000 mg/h | INTRAVENOUS | Status: DC
  Filled 2018-11-08: qty 10

## 2018-11-08 MED ORDER — LORAZEPAM 2 MG/ML IJ SOLN
4.0000 mg | Freq: Once | INTRAMUSCULAR | Status: AC
  Administered 2018-11-08: 2 mg via INTRAVENOUS

## 2018-11-08 MED ORDER — PROPOFOL 1000 MG/100ML IV EMUL
5.0000 ug/kg/min | INTRAVENOUS | Status: DC
  Administered 2018-11-08: 5 ug/kg/min via INTRAVENOUS

## 2018-11-08 MED ORDER — HEPARIN SODIUM (PORCINE) 5000 UNIT/ML IJ SOLN
5000.0000 [IU] | Freq: Three times a day (TID) | INTRAMUSCULAR | Status: DC
  Filled 2018-11-08: qty 1

## 2018-11-08 MED ORDER — FAMOTIDINE 20 MG PO TABS
20.0000 mg | ORAL_TABLET | Freq: Two times a day (BID) | ORAL | Status: DC
  Administered 2018-11-08 – 2018-11-22 (×29): 20 mg via ORAL
  Filled 2018-11-08 (×30): qty 1

## 2018-11-08 MED ORDER — MIDODRINE HCL 5 MG PO TABS
10.0000 mg | ORAL_TABLET | Freq: Three times a day (TID) | ORAL | Status: DC
  Administered 2018-11-08 – 2018-11-18 (×24): 10 mg via ORAL
  Filled 2018-11-08 (×34): qty 2

## 2018-11-08 MED ORDER — LORAZEPAM 2 MG/ML IJ SOLN
INTRAMUSCULAR | Status: AC
  Filled 2018-11-08: qty 2

## 2018-11-08 MED ORDER — VALPROATE SODIUM 500 MG/5ML IV SOLN
500.0000 mg | Freq: Two times a day (BID) | INTRAVENOUS | Status: DC
  Administered 2018-11-08 – 2018-11-22 (×29): 500 mg via INTRAVENOUS
  Filled 2018-11-08 (×32): qty 5

## 2018-11-08 MED ORDER — ROCURONIUM BROMIDE 50 MG/5ML IV SOLN
1.0000 mg/kg | Freq: Once | INTRAVENOUS | Status: AC
  Administered 2018-11-19: 44 mg via INTRAVENOUS
  Filled 2018-11-08: qty 4.4

## 2018-11-08 MED ORDER — ORAL CARE MOUTH RINSE
15.0000 mL | OROMUCOSAL | Status: DC
  Administered 2018-11-08 – 2018-11-25 (×167): 15 mL via OROMUCOSAL

## 2018-11-08 MED ORDER — HEPARIN SODIUM (PORCINE) 5000 UNIT/ML IJ SOLN
5000.0000 [IU] | Freq: Three times a day (TID) | INTRAMUSCULAR | Status: DC
  Administered 2018-11-08 – 2018-11-16 (×23): 5000 [IU] via SUBCUTANEOUS
  Filled 2018-11-08 (×22): qty 1

## 2018-11-08 MED ORDER — SODIUM CHLORIDE 0.9 % IV SOLN
2.0000 g | INTRAVENOUS | Status: AC
  Administered 2018-11-09 – 2018-11-13 (×5): 2 g via INTRAVENOUS
  Filled 2018-11-08 (×6): qty 20

## 2018-11-08 MED ORDER — SODIUM CHLORIDE 0.9 % IV SOLN
500.0000 mg | INTRAVENOUS | Status: AC
  Administered 2018-11-08 – 2018-11-12 (×5): 500 mg via INTRAVENOUS
  Filled 2018-11-08 (×7): qty 500

## 2018-11-08 MED ORDER — FENTANYL 2500MCG IN NS 250ML (10MCG/ML) PREMIX INFUSION
0.0000 ug/h | INTRAVENOUS | Status: DC
  Administered 2018-11-08: 50 ug/h via INTRAVENOUS
  Administered 2018-11-08: 30 ug/h via INTRAVENOUS
  Administered 2018-11-09: 50 ug/h via INTRAVENOUS
  Administered 2018-11-10: 160 ug/h via INTRAVENOUS
  Administered 2018-11-10: 250 ug/h via INTRAVENOUS
  Administered 2018-11-10: 225 ug/h via INTRAVENOUS
  Administered 2018-11-11: 350 ug/h via INTRAVENOUS
  Administered 2018-11-11: 400 ug/h via INTRAVENOUS
  Administered 2018-11-11: 275 ug/h via INTRAVENOUS
  Administered 2018-11-12: 300 ug/h via INTRAVENOUS
  Administered 2018-11-12: 250 ug/h via INTRAVENOUS
  Administered 2018-11-12: 350 ug/h via INTRAVENOUS
  Administered 2018-11-13: 250 ug/h via INTRAVENOUS
  Administered 2018-11-13: 200 ug/h via INTRAVENOUS
  Administered 2018-11-14: 100 ug/h via INTRAVENOUS
  Administered 2018-11-14 – 2018-11-15 (×2): 200 ug/h via INTRAVENOUS
  Administered 2018-11-15: 375 ug/h via INTRAVENOUS
  Administered 2018-11-15: 400 ug/h via INTRAVENOUS
  Administered 2018-11-16: 35 ug/h via INTRAVENOUS
  Administered 2018-11-16 (×2): 350 ug/h via INTRAVENOUS
  Administered 2018-11-17: 315 ug/h via INTRAVENOUS
  Administered 2018-11-17 – 2018-11-18 (×5): 400 ug/h via INTRAVENOUS
  Filled 2018-11-08 (×29): qty 250

## 2018-11-08 MED ORDER — FENTANYL CITRATE (PF) 100 MCG/2ML IJ SOLN
25.0000 ug | Freq: Once | INTRAMUSCULAR | Status: AC
  Administered 2018-11-08: 25 ug via INTRAVENOUS
  Filled 2018-11-08: qty 2

## 2018-11-08 MED ORDER — ETOMIDATE 2 MG/ML IV SOLN
INTRAVENOUS | Status: AC | PRN
  Administered 2018-11-08: 13.2 mg via INTRAVENOUS

## 2018-11-08 MED ORDER — NOREPINEPHRINE-SODIUM CHLORIDE 4-0.9 MG/250ML-% IV SOLN
0.0000 ug/min | INTRAVENOUS | Status: DC
  Administered 2018-11-08: 5 ug/min via INTRAVENOUS
  Administered 2018-11-08: 8 ug/min via INTRAVENOUS
  Filled 2018-11-08: qty 250

## 2018-11-08 NOTE — ED Notes (Signed)
Money and little change purse in security.

## 2018-11-08 NOTE — Progress Notes (Signed)
CRITICAL VALUE ALERT  Critical Value:  Lactic acid= 3.5  Date & Time Notied: 11/08/18 1745  Provider Notified: Icard  Orders Received/Actions taken: No new orders

## 2018-11-08 NOTE — Progress Notes (Signed)
CRITICAL VALUE ALERT  Critical Value:  Lactic acid-2.9  Date & Time Notied:  11/08/2018 2125  Provider Notified: Elink  Orders Received/Actions taken: none at this time

## 2018-11-08 NOTE — ED Notes (Signed)
Time out for intubation- dr spoke with caregiver for urgent consent.
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
## 2018-11-08 NOTE — ED Triage Notes (Signed)
Pt arrives via [facility] from home, ems reports they were called out for SOB, upon fire arrival, patient was unresponsive with agonal breathing, fire assisted ventilations for approx 6 mins until patient's O2 sat and mental status improved. Upon EMS arrival, pt was placed on NRB where she maintains O2 sat of 100%, she received 0.5mg  atrovent, 10mg  albuterol, 125mg  solumedrol and 2g Mag+. Pt wears O2 at baseline and husband reports sometimes it comes off at night while she is sleeping. Lung sounds diminished. VSS: HR 110, RR24, BP156/92, CBG 132.

## 2018-11-08 NOTE — ED Notes (Signed)
Pt now more awake--

## 2018-11-08 NOTE — ED Provider Notes (Addendum)
  Physical Exam  BP 95/65   Pulse 89   Temp 98.3 F (36.8 C) (Oral)   Resp 13   SpO2 100%   Physical Exam Vitals signs and nursing note reviewed.  Constitutional:      General: She is not in acute distress.    Appearance: She is cachectic. She is ill-appearing.  HENT:     Head: Normocephalic and atraumatic.  Cardiovascular:     Rate and Rhythm: Tachycardia present.  Pulmonary:     Breath sounds: Wheezing present.     Comments: Decreased breath sounds, bipap, bradypnea Neurological:     Mental Status: She is lethargic.     ED Course/Procedures     .Critical Care Performed by: Gareth Morgan, MD Authorized by: Gareth Morgan, MD   Critical care provider statement:    Critical care time (minutes):  90   Critical care was time spent personally by me on the following activities:  Discussions with consultants, evaluation of patient's response to treatment, examination of patient, ordering and performing treatments and interventions, ordering and review of laboratory studies, ordering and review of radiographic studies, pulse oximetry, re-evaluation of patient's condition and obtaining history from patient or surrogate  Procedure Name: Intubation Date/Time: 11/08/2018 9:46 PM Performed by: Gareth Morgan, MD Pre-anesthesia Checklist: Patient identified, Patient being monitored, Emergency Drugs available, Timeout performed and Suction available Oxygen Delivery Method: Ambu bag Preoxygenation: Pre-oxygenation with 100% oxygen Induction Type: Rapid sequence Ventilation: Two handed mask ventilation required Laryngoscope Size: Glidescope Grade View: Grade I Tube size: 7.0 mm Number of attempts: 1 Placement Confirmation: ETT inserted through vocal cords under direct vision,  CO2 detector and Breath sounds checked- equal and bilateral Secured at: 24 cm       MDM  Received care of pt from Dr. Betsey Holiday. Please see his note for history, physical, and prior care.  Briefly  this is a 58yo female who presented with dyspnea and decreased responsiveness.  Initially appeared to be improving on BiPAP, however blood gas show worsening hypoxia.  Saturations reading normal on monitor, however ABG with low pO2. Repeat lab obtained and respiratory states it was certainly an ABG and has similar result.  Ordered COOX which shows CO elevated likely secondary to smoking.    Given respiratory failure with hypoxia and hypercarbia, patient requires intubation.   So far as caregiver knows at bedside, she is full code.   Trialed 2mg  of narcan which did appear to make her more arousable, however she continued to have severe bradypnea, concerning given her severe hypercarbia and hypoxia, and continued to be somnolent.  Intubated for respiratory failure.  CT PE study done shows no evidence of PE.    Suspect presentation consistent with severe COPD with exacerbation, tiring, possible substance abuse.  Called ICU for admission.  Gave propofol for sedation, however pt with continued difficulties with sedation as well as decreased blood pressures.  Ordered fluids and levophed.  Ordered scheduled solumedrol and albuterol.   In order to attempt to discontinue levophed, initiated precedex and fentanyl gtt and discontinued propofol.     Blood gas shows improvement in oxygenation and PCO2.  Notified of shaking activity, however on my exam does not appear to have continued shaking and has no sign of seizure-like activity. No history of etoh use per caregiver.  Patient admitted to ICU for further care.     Gareth Morgan, MD 11/08/18 2147

## 2018-11-08 NOTE — ED Notes (Signed)
Pt shaking - caregiver states that she has hx of seizures

## 2018-11-08 NOTE — Progress Notes (Signed)
Assist in transport pt to ICU. No noted respirtory issues at this time.
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
## 2018-11-08 NOTE — ED Notes (Signed)
Pt remains slow to respond-- no response from Narcan

## 2018-11-09 ENCOUNTER — Inpatient Hospital Stay (HOSPITAL_COMMUNITY): Payer: Medicaid Other

## 2018-11-09 DIAGNOSIS — R0600 Dyspnea, unspecified: Secondary | ICD-10-CM

## 2018-11-09 LAB — POCT I-STAT 7, (LYTES, BLD GAS, ICA,H+H)
Acid-Base Excess: 8 mmol/L — ABNORMAL HIGH (ref 0.0–2.0)
BICARBONATE: 36.7 mmol/L — AB (ref 20.0–28.0)
CALCIUM ION: 1.33 mmol/L (ref 1.15–1.40)
HCT: 37 % (ref 36.0–46.0)
Hemoglobin: 12.6 g/dL (ref 12.0–15.0)
O2 Saturation: 98 %
Patient temperature: 97.9
Potassium: 4.1 mmol/L (ref 3.5–5.1)
Sodium: 139 mmol/L (ref 135–145)
TCO2: 39 mmol/L — ABNORMAL HIGH (ref 22–32)
pCO2 arterial: 67.6 mmHg (ref 32.0–48.0)
pH, Arterial: 7.341 — ABNORMAL LOW (ref 7.350–7.450)
pO2, Arterial: 122 mmHg — ABNORMAL HIGH (ref 83.0–108.0)

## 2018-11-09 LAB — MAGNESIUM: MAGNESIUM: 2.3 mg/dL (ref 1.7–2.4)

## 2018-11-09 LAB — GLUCOSE, CAPILLARY
GLUCOSE-CAPILLARY: 133 mg/dL — AB (ref 70–99)
Glucose-Capillary: 123 mg/dL — ABNORMAL HIGH (ref 70–99)
Glucose-Capillary: 141 mg/dL — ABNORMAL HIGH (ref 70–99)
Glucose-Capillary: 147 mg/dL — ABNORMAL HIGH (ref 70–99)
Glucose-Capillary: 148 mg/dL — ABNORMAL HIGH (ref 70–99)
Glucose-Capillary: 165 mg/dL — ABNORMAL HIGH (ref 70–99)

## 2018-11-09 LAB — RESPIRATORY PANEL BY PCR
Adenovirus: NOT DETECTED
BORDETELLA PERTUSSIS-RVPCR: NOT DETECTED
CHLAMYDOPHILA PNEUMONIAE-RVPPCR: NOT DETECTED
Coronavirus 229E: NOT DETECTED
Coronavirus HKU1: NOT DETECTED
Coronavirus NL63: NOT DETECTED
Coronavirus OC43: NOT DETECTED
Influenza A: NOT DETECTED
Influenza B: NOT DETECTED
Metapneumovirus: NOT DETECTED
Mycoplasma pneumoniae: NOT DETECTED
Parainfluenza Virus 1: NOT DETECTED
Parainfluenza Virus 2: NOT DETECTED
Parainfluenza Virus 3: NOT DETECTED
Parainfluenza Virus 4: NOT DETECTED
Respiratory Syncytial Virus: NOT DETECTED
Rhinovirus / Enterovirus: NOT DETECTED

## 2018-11-09 LAB — CBC
HCT: 39.9 % (ref 36.0–46.0)
Hemoglobin: 11.8 g/dL — ABNORMAL LOW (ref 12.0–15.0)
MCH: 27.3 pg (ref 26.0–34.0)
MCHC: 29.6 g/dL — AB (ref 30.0–36.0)
MCV: 92.4 fL (ref 80.0–100.0)
Platelets: 206 10*3/uL (ref 150–400)
RBC: 4.32 MIL/uL (ref 3.87–5.11)
RDW: 14.3 % (ref 11.5–15.5)
WBC: 8.7 10*3/uL (ref 4.0–10.5)
nRBC: 0 % (ref 0.0–0.2)

## 2018-11-09 LAB — URINE CULTURE

## 2018-11-09 LAB — BASIC METABOLIC PANEL
Anion gap: 11 (ref 5–15)
BUN: 27 mg/dL — ABNORMAL HIGH (ref 6–20)
CO2: 29 mmol/L (ref 22–32)
Calcium: 9.4 mg/dL (ref 8.9–10.3)
Chloride: 103 mmol/L (ref 98–111)
Creatinine, Ser: 0.62 mg/dL (ref 0.44–1.00)
GFR calc Af Amer: >60 mL/min (ref >60)
GFR calc non Af Amer: >60 mL/min (ref >60)
Glucose, Bld: 173 mg/dL — ABNORMAL HIGH (ref 70–99)
Potassium: 4.3 mmol/L (ref 3.5–5.1)
Sodium: 143 mmol/L (ref 135–145)

## 2018-11-09 LAB — TROPONIN I: Troponin I: <0.03 ng/mL (ref <0.03)

## 2018-11-09 LAB — PHOSPHORUS: Phosphorus: 4 mg/dL (ref 2.5–4.6)

## 2018-11-09 MED ORDER — VITAL AF 1.2 CAL PO LIQD
1000.0000 mL | ORAL | Status: DC
  Administered 2018-11-09 – 2018-11-11 (×3): 1000 mL

## 2018-11-09 MED ORDER — MIDAZOLAM HCL 2 MG/2ML IJ SOLN
INTRAMUSCULAR | Status: AC
  Filled 2018-11-09: qty 2

## 2018-11-09 MED ORDER — LEVOTHYROXINE SODIUM 100 MCG/5ML IV SOLN
37.5000 ug | Freq: Every day | INTRAVENOUS | Status: DC
  Administered 2018-11-09 – 2018-11-22 (×14): 37.5 ug via INTRAVENOUS
  Filled 2018-11-09 (×14): qty 5

## 2018-11-09 MED ORDER — MIDAZOLAM HCL 2 MG/2ML IJ SOLN
2.0000 mg | INTRAMUSCULAR | Status: DC | PRN
  Administered 2018-11-09 – 2018-11-11 (×11): 2 mg via INTRAVENOUS
  Filled 2018-11-09 (×10): qty 2

## 2018-11-09 MED ORDER — HYDRALAZINE HCL 20 MG/ML IJ SOLN
5.0000 mg | INTRAMUSCULAR | Status: DC | PRN
  Administered 2018-11-09 – 2018-11-11 (×2): 5 mg via INTRAVENOUS
  Filled 2018-11-09 (×2): qty 1

## 2018-11-09 MED ORDER — FENTANYL BOLUS VIA INFUSION
25.0000 ug | INTRAVENOUS | Status: DC | PRN
  Administered 2018-11-10: 100 ug via INTRAVENOUS
  Administered 2018-11-10: 50 ug via INTRAVENOUS
  Administered 2018-11-10 – 2018-11-15 (×15): 100 ug via INTRAVENOUS
  Administered 2018-11-15: 50 ug via INTRAVENOUS
  Administered 2018-11-17 – 2018-11-18 (×3): 100 ug via INTRAVENOUS
  Filled 2018-11-09: qty 100

## 2018-11-09 NOTE — Progress Notes (Addendum)
NAME:  Courtney Terry, MRN:  [id number], DOB:  11/28/1960, LOS: 1 ADMISSION DATE:  11/08/2018, CONSULTATION DATE:  11/08/18 REFERRING MD:  Karmen Bongo, MD CHIEF COMPLAINT:  Respiratory failure  Brief History   58 year old female with COPD (FEV1 25%), chronic hypoxemic respiratory failure, with recent hospitalization in December for COPD exacerbation who presents with SOB and AMS and found in acute on chronic hypoxemic and hypercarbic respiratory failure requiring intubation. Initially admitted by Hospitalist. PCCM consulted for admission to the intensive care unit after being intubated in the emergency room on 2/2  History of present illness   58 year old female with PMHx below who presents with worsening dyspnea and altered mental status. EMS was called and she was treated with bag mask ventilation, nebulizers, steroids and magnesium with improvement.  Patient was subsequently found to be hypercarbic and hypoxemic with a depressed mental status in the emergency room.  Patient required intubation in ED for respiratory failure. UDS positive for cocaine.  Of note she has a past medical history of severe COPD FEV1 25% predicted (spirometry 10/2016).  CTA completed in the emergency room negative for pulmonary embolism with advanced evidence of COPD.   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%) Chronic hypoxemic respiratory failure on home 2L Active tobacco use Polysubstance abuse Schizophrenia  Significant Hospital Events   2/2 Admitted and intubated  Consults:  PCCM  Procedures:  ETT 2/2>>  Significant Diagnostic Tests:  CTA 2/2:  Neg for PE, emphysema, multiple pulmonary nodules, ETT in place CT head w/o contrast 2/2: stable/normal  Micro Data:  BCx 2/2: no growth x24h Sputum cx 2/2: moderate gram positive cocci  RVP 2/2: negative Urine cx 2/2: >= 1000,000 unidentified organisms   Antimicrobials:  Azithromycin 11/08/2018>> CTX 11/09/18>>  Interim history/subjective:    Unable to obtain patient intubated on mechanical ventilation unresponsive.  Objective   Blood pressure 109/84, pulse 82, temperature 98.3 F (36.8 C), temperature source Oral, resp. rate 14, height 5\' 10"  (1.778 m), weight 46.1 kg, SpO2 100 %.    Vent Mode: PRVC FiO2 (%):  [40 %-50 %] 40 % Set Rate:  [14 bmp] 14 bmp Vt Set:  [470 mL] 470 mL PEEP:  [5 cmH20-8 cmH20] 8 cmH20 Plateau Pressure:  [18 cmH20-29 cmH20] 20 cmH20   Intake/Output Summary (Last 24 hours) at 11/09/2018 1049 Last data filed at 11/09/2018 1000 Gross per 24 hour  Intake 3428.83 ml  Output 505 ml  Net 2923.83 ml   Filed Weights   11/08/18 1600 11/09/18 0200  Weight: 45.4 kg 46.1 kg    Physical Exam: General: cachectic, intubated and sedated on mechanical ventilation  HENT: NCAT Neck: trachea midline, thin neck, ETT in place  Eyes: sclera clear, pupils round and reactive  Respiratory: CTAB  Chest: barrel chest  Cardiovascular: RRR, no MRG GI: somewhat firm, non distended, bowel sounds present  Extremities: no edema, 2+ DP pulses  Neuro: sedated  GU: foley in place  Resolved Hospital Problem list    Assessment & Plan:   Acute on chronic hypoxemic and hypercarbic respiratory failure requiring intubation and mechanical ventilation secondary to severe acute COPD Exacerbation in a patient with very severe COPD, prior FEV1 25% and severe COPD related cachexia LA downtrending 3.5>2.9 -Patient remains intubated on mechanical ventilation -We will need to maintain respiratory rate low to allow significant expiratory time - Continue 80 mg Solu-Medrol q6h (2/2>>) -- Continue Pulmicort and Brovana nebulizers - Scheduled duo nebs q6h - PRN albuterol -  Azithromycin 500 mg daily for 5 days - Overall prognosis related to this event is very poor - Continue to follow blood, urine, and sputum cultures    Hypotension-improving Chronic hypotension on midodrine at home Unsure if this is related to baseline chronic  hypotension or exacerbated by significant intrathoracic pressures due to her severe hyperexpansion and being placed on mechanical ventilation. -Continue CTX (2/3>>) and Azithro (2/2>>) --Can consider discontinuing tamiflu given neg RVP --continue midodrine 10mg  tid   Possible seizure-like activity valproic acid level <10, possibly non compliant at home  -s/p Ativan 2 mg x 1 -PRN Ativan for seizure-like activity --EEG pending --continue valproate IV 500mg  q12h  Urine drug screen positive for cocaine History of polysubstance abuse and opiate abuse in the past. Current tobacco abuse history -encourage cessation, especially given home O2 use and severe COPD  Schizophrenia PTSD Bipolar disease Home olanzapine 20mg  qhs, prozac 40mg  daily -holding home meds while intubated   Right breast cancer status post lumpectomy T1N0 Home meds: arimidex -holding home meds while intubated  Hypothyroidism  TSH wnl on admission. Home meds synthroid 75 mcg  -holding home meds while intubated  Nutrition --Continue adult tube feed protocol   Best practice:  Diet: NPO, adult tube feed protocol  Pain/Anxiety/Delirium protocol (if indicated): Sedation with fentanyl and Precedex VAP protocol (if indicated): Yes DVT prophylaxis: SQ Heparin GI prophylaxis: Famotidine Glucose control: SSI Mobility: BR Code Status: Full Family Communication: None at bedside this afternoon  Disposition: Continue in ICU, continues to be intubated   Labs   CBC: Recent Labs  Lab 11/08/18 0429  11/08/18 1055 11/08/18 1703 11/08/18 1719 11/09/18 0432 11/09/18 0449  WBC 9.9  --   --  5.1  --  8.7  --   NEUTROABS 7.8*  --   --  4.2  --   --   --   HGB 12.2   < > 13.6 12.0 12.6 11.8* 12.6  HCT 44.8   < > 40.0 41.9 37.0 39.9 37.0  MCV 98.0  --   --  94.4  --  92.4  --   PLT 217  --   --  198  --  206  --    < > = values in this interval not displayed.    Basic Metabolic Panel: Recent Labs  Lab 11/08/18 0429   11/08/18 1055 11/08/18 1703 11/08/18 1719 11/09/18 0432 11/09/18 0449  NA 144   < > 138 142 139 143 139  K 3.8   < > 4.0 4.8 4.4 4.3 4.1  CL 94*  --   --  101  --  103  --   CO2 41*  --   --  25  --  29  --   GLUCOSE 156*  --   --  147*  --  173*  --   BUN 11  --   --  15  --  27*  --   CREATININE 0.47  --   --  0.63  --  0.62  --   CALCIUM 9.1  --   --  9.0  --  9.4  --   MG  --   --   --  2.1  --  2.3  --   PHOS  --   --   --  2.8  --  4.0  --    < > = values in this interval not displayed.   GFR: Estimated Creatinine Clearance: 57.1 mL/min (by C-G formula based on  SCr of 0.62 mg/dL). Recent Labs  Lab 11/08/18 0429 11/08/18 1703 11/08/18 2125 11/09/18 0432  WBC 9.9 5.1  --  8.7  LATICACIDVEN  --  3.5* 2.9*  --     Liver Function Tests: Recent Labs  Lab 11/08/18 1703  AST 22  ALT 14  ALKPHOS 65  BILITOT 0.8  PROT 6.2*  ALBUMIN 3.5   No results for input(s): LIPASE, AMYLASE in the last 168 hours. No results for input(s): AMMONIA in the last 168 hours.  ABG    Component Value Date/Time   PHART 7.341 (L) 11/09/2018 0449   PCO2ART 67.6 (HH) 11/09/2018 0449   PO2ART 122.0 (H) 11/09/2018 0449   HCO3 36.7 (H) 11/09/2018 0449   TCO2 39 (H) 11/09/2018 0449   ACIDBASEDEF 0.4 06/30/2014 1033   O2SAT 98.0 11/09/2018 0449     Coagulation Profile: No results for input(s): INR, PROTIME in the last 168 hours.  Cardiac Enzymes: Recent Labs  Lab 11/08/18 0429 11/08/18 1703 11/08/18 2125 11/09/18 0432  TROPONINI <0.03 <0.03 <0.03 <0.03    HbA1C: Hgb A1c MFr Bld  Date/Time Value Ref Range Status  02/26/2016 04:15 PM 6.2 (H) 4.8 - 5.6 % Final    Comment:    (NOTE)         Pre-diabetes: 5.7 - 6.4         Diabetes: >6.4         Glycemic control for adults with diabetes: <7.0     CBG: Recent Labs  Lab 11/08/18 2050 11/09/18 0001 11/09/18 0418 11/09/18 0759  GLUCAP 149* 147* 165* 141*    This patient is critically ill with multiple organ system  failure; which, requires frequent high complexity decision making, assessment, support, evaluation, and titration of therapies. This was completed through the application of advanced monitoring technologies and extensive interpretation of multiple databases. During this encounter critical care time was devoted to patient care services described in this note for 38 minutes.   Caroline More, DO PGY-2, [facility]  11/09/2018 10:49 AM

## 2018-11-09 NOTE — Procedures (Signed)
[place] A. Merlene Laughter, MD     [email or web address]           HISTORY: The patient is a 58 year old who presents with dyspnea and altered mental status.  The studies been done to evaluate for nonconvulsive seizure as an etiology.  MEDICATIONS: Scheduled Meds: . arformoterol  15 mcg Nebulization BID  . budesonide (PULMICORT) nebulizer solution  0.5 mg Nebulization BID  . chlorhexidine gluconate (MEDLINE KIT)  15 mL Mouth Rinse BID  . etomidate  0.3 mg/kg Intravenous Once  . famotidine  20 mg Oral BID  . feeding supplement (PRO-STAT SUGAR FREE 64)  30 mL Per Tube BID  . feeding supplement (VITAL HIGH PROTEIN)  1,000 mL Per Tube Q24H  . heparin  5,000 Units Subcutaneous Q8H  . ipratropium-albuterol  3 mL Nebulization Q6H  . levothyroxine  37.5 mcg Intravenous Daily  . mouth rinse  15 mL Mouth Rinse 10 times per day  . methylPREDNISolone (SOLU-MEDROL) injection  80 mg Intravenous Q6H  . midodrine  10 mg Oral TID WC  . rocuronium  1 mg/kg Intravenous Once   Continuous Infusions: . sodium chloride Stopped (11/09/18 0925)  . azithromycin Stopped (11/08/18 1812)  . cefTRIAXone (ROCEPHIN)  IV    . dexmedetomidine (PRECEDEX) IV infusion 1 mcg/kg/hr (11/09/18 1000)  . fentaNYL infusion INTRAVENOUS 100 mcg/hr (11/09/18 1000)  . naLOXone [facility]) adult infusion for OVERDOSE    . norepinephrine (LEVOPHED) Adult infusion Stopped (11/08/18 2253)  . sodium chloride Stopped (11/08/18 0800)  . valproate sodium 55 mL/hr at 11/09/18 1000   PRN Meds:.sodium chloride, albuterol  Prior to Admission medications   Medication Sig Start Date End Date Taking? Authorizing Provider  albuterol (PROAIR HFA) 108 (90 Base) MCG/ACT inhaler INHALE TWO PUFFS INTO THE LUNGS EVERY 4 HOURS AS NEEDED FOR WHEEZING OR SHORTNESS OF BREATH Patient taking differently: Inhale 2 puffs into the lungs every 4 (four) hours as needed for wheezing or shortness of breath.  10/30/18  Yes Fenton Foy, NP    albuterol (PROVENTIL) (2.5 MG/3ML) 0.083% nebulizer solution Take 3 mLs (2.5 mg total) by nebulization every 4 (four) hours as needed for wheezing or shortness of breath. 10/30/18  Yes Fenton Foy, NP  anastrozole (ARIMIDEX) 1 MG tablet TAKE ONE TABLET BY MOUTH DAILY Patient taking differently: Take 1 mg by mouth daily.  10/27/18  Yes Truitt Merle, MD  budesonide (PULMICORT) 0.5 MG/2ML nebulizer solution Take 0.5 mg by nebulization 2 (two) times daily.   Yes [provider]  famotidine (PEPCID) 20 MG tablet One at bedtime Patient taking differently: Take 20 mg by mouth at bedtime.  12/30/17  Yes Tanda Rockers, MD  fludrocortisone (FLORINEF) 0.1 MG tablet Take 1 tablet (0.1 mg total) by mouth daily. 10/30/18  Yes Fenton Foy, NP  Glycopyrrolate-Formoterol (BEVESPI AEROSPHERE) 9-4.8 MCG/ACT AERO Inhale 2 puffs into the lungs 2 (two) times daily. 10/30/18  Yes Fenton Foy, NP  predniSONE (DELTASONE) 10 MG tablet Take 1 tablet (10 mg total) by mouth daily with breakfast. 10/30/18 01/28/19 Yes Fenton Foy, NP  divalproex (DEPAKOTE) 500 MG DR tablet Take 500 mg by mouth 2 (two) times daily.     [provider]  feeding supplement, ENSURE ENLIVE, (ENSURE ENLIVE) LIQD Take 237 mLs by mouth 3 (three) times daily between meals. 06/12/18   Debbe Odea, MD  FLUoxetine (PROZAC) 40 MG capsule Take 40 mg by mouth daily.     [provider]  Glycopyrrolate-Formoterol (BEVESPI AEROSPHERE) 9-4.8  MCG/ACT AERO Inhale 2 puffs into the lungs 2 (two) times daily. Patient not taking: Reported on 11/09/2018 07/28/18   Lauraine Rinne, NP  levothyroxine (SYNTHROID, LEVOTHROID) 75 MCG tablet Take 1 tablet (75 mcg total) daily by mouth. 08/14/17   Truitt Merle, MD  midodrine (PROAMATINE) 10 MG tablet Take 1 tablet (10 mg total) by mouth 3 (three) times daily. 09/20/18   Domenic Polite, MD  Multiple Vitamin (MULTIVITAMIN WITH MINERALS) TABS tablet Take 1 tablet by mouth daily. 06/13/18   Debbe Odea, MD  OLANZapine zydis (ZYPREXA) 20 MG disintegrating tablet Take 20 mg by mouth at bedtime.     [provider]  omeprazole (PRILOSEC) 40 MG capsule Take 30-60 min before first meal of the day 12/30/17   Tanda Rockers, MD  OXYGEN Inhale 2 L into the lungs as needed (for shortness of breath).     [provider]      ANALYSIS: A 16 channel recording using standard 10 20 measurements is conducted for 25 minutes.  The background activity gets as high as 6 Hz.  However, there is often 4-5 Hz activity seen throughout the recording.  There is beta activity observed in the frontal areas.  Photic stimulation and hyperventilation are not conducted.  There is no focal or lateralized slowing.  There is no epileptiform activity is observed.   IMPRESSION: 1.  This recording shows moderate global slowing indicating moderate global encephalopathy.  However, no epileptiform activities are observed.      Sravya Grissom A. Merlene Laughter, M.D.  Diplomate, Tax adviser of Psychiatry and Neurology ( Neurology).

## 2018-11-09 NOTE — Progress Notes (Signed)
EEG complete - results pending 

## 2018-11-09 NOTE — Progress Notes (Signed)
[place] Progress Note Patient Name: Courtney Terry DOB: 12-Jan-1961 MRN: [id number]   Date of Service  11/09/2018  HPI/Events of Note  Called d/t shivering - Temp = 97 F. Patient able to follow commands. This is not a seizure.   eICU Interventions  Plan: 1. Warm blankets. 2. Continue to trend temperature and symptoms.      Intervention Category Major Interventions: Other:  Lysle Dingwall 11/09/2018, 7:41 PM

## 2018-11-09 NOTE — Progress Notes (Signed)
Initial Nutrition Assessment  DOCUMENTATION CODES:   Severe malnutrition in context of chronic illness, Underweight  INTERVENTION:   Change TF to better meet re-estimated needs:  Vital AF 1.2 at 45 ml/h (1080 ml per day)  Provides 1296 kcal, 81 gm protein, 876 ml free water daily  Monitor magnesium, potassium, and phosphorus daily for at least 3 days, MD to replete as needed, as pt is at risk for refeeding syndrome given severe PCM.  NUTRITION DIAGNOSIS:   Severe Malnutrition related to chronic illness(COPD) as evidenced by severe fat depletion, severe muscle depletion.  GOAL:   Patient will meet greater than or equal to 90% of their needs  MONITOR:   Vent status, TF tolerance, Labs, I & O's  REASON FOR ASSESSMENT:   Ventilator, Consult Enteral/tube feeding initiation and management  ASSESSMENT:   58 yo female with PMH of asthma, Bipolar D/O, PTSD, COPD, anemia, schizophrenia, hypothyroidism, HTN, seizures, breast cancer, cocaine abuse who was admitted with COPD exacerbation, VDRF.  Received MD Consult for TF initiation and management. OGT in place. Currently receiving Vital High Protein at 40 ml/h with Pro-stat 30 ml BID per Adult TF Protocol for ICU patients.   Patient is currently intubated on ventilator support MV: 6.7 L/min Temp (24hrs), Avg:98.2 F (36.8 C), Min:97.9 F (36.6 C), Max:98.3 F (36.8 C)   Labs reviewed. CBG's: 141-148 Medications reviewed and include precedex, levophed.     NUTRITION - FOCUSED PHYSICAL EXAM:    Most Recent Value  Orbital Region  Severe depletion  Upper Arm Region  Severe depletion  Thoracic and Lumbar Region  Severe depletion  Buccal Region  Unable to assess  Temple Region  Severe depletion  Clavicle Bone Region  Severe depletion  Clavicle and Acromion Bone Region  Severe depletion  Scapular Bone Region  Unable to assess  Dorsal Hand  Severe depletion  Patellar Region  Severe depletion  Anterior Thigh Region  Severe  depletion  Posterior Calf Region  Severe depletion  Edema (RD Assessment)  None  Hair  Reviewed  Eyes  Unable to assess  Mouth  Unable to assess  Skin  Reviewed  Nails  Reviewed       Diet Order:   Diet Order            Diet NPO time specified  Diet effective now              EDUCATION NEEDS:   No education needs have been identified at this time  Skin:  Skin Assessment: Reviewed RN Assessment  Last BM:  PTA  Height:   Ht Readings from Last 1 Encounters:  11/08/18 5\' 10"  (1.778 m)    Weight:   Wt Readings from Last 1 Encounters:  11/09/18 46.1 kg    Ideal Body Weight:  68.2 kg  BMI:  Body mass index is 14.58 kg/m.  Estimated Nutritional Needs:   Kcal:  1230  Protein:  70-80 gm  Fluid:  1.5 L    Molli Barrows, RD, LDN, [place] Pager [phone] After Hours Pager [phone]

## 2018-11-10 ENCOUNTER — Inpatient Hospital Stay (HOSPITAL_COMMUNITY): Payer: Medicaid Other

## 2018-11-10 LAB — POCT I-STAT 7, (LYTES, BLD GAS, ICA,H+H)
ACID-BASE EXCESS: 11 mmol/L — AB (ref 0.0–2.0)
Bicarbonate: 37.3 mmol/L — ABNORMAL HIGH (ref 20.0–28.0)
Calcium, Ion: 1.27 mmol/L (ref 1.15–1.40)
HCT: 37 % (ref 36.0–46.0)
Hemoglobin: 12.6 g/dL (ref 12.0–15.0)
O2 Saturation: 98 %
Patient temperature: 98.6
Potassium: 4.4 mmol/L (ref 3.5–5.1)
Sodium: 139 mmol/L (ref 135–145)
TCO2: 39 mmol/L — ABNORMAL HIGH (ref 22–32)
pCO2 arterial: 54.9 mmHg — ABNORMAL HIGH (ref 32.0–48.0)
pH, Arterial: 7.441 (ref 7.350–7.450)
pO2, Arterial: 107 mmHg (ref 83.0–108.0)

## 2018-11-10 LAB — GLUCOSE, CAPILLARY
Glucose-Capillary: 112 mg/dL — ABNORMAL HIGH (ref 70–99)
Glucose-Capillary: 128 mg/dL — ABNORMAL HIGH (ref 70–99)
Glucose-Capillary: 145 mg/dL — ABNORMAL HIGH (ref 70–99)
Glucose-Capillary: 150 mg/dL — ABNORMAL HIGH (ref 70–99)
Glucose-Capillary: 150 mg/dL — ABNORMAL HIGH (ref 70–99)
Glucose-Capillary: 155 mg/dL — ABNORMAL HIGH (ref 70–99)

## 2018-11-10 LAB — BASIC METABOLIC PANEL
Anion gap: 8 (ref 5–15)
BUN: 27 mg/dL — ABNORMAL HIGH (ref 6–20)
CHLORIDE: 99 mmol/L (ref 98–111)
CO2: 32 mmol/L (ref 22–32)
Calcium: 9.3 mg/dL (ref 8.9–10.3)
Creatinine, Ser: 0.5 mg/dL (ref 0.44–1.00)
GFR calc Af Amer: >60 mL/min (ref >60)
GFR calc non Af Amer: >60 mL/min (ref >60)
Glucose, Bld: 140 mg/dL — ABNORMAL HIGH (ref 70–99)
Potassium: 5.1 mmol/L (ref 3.5–5.1)
SODIUM: 139 mmol/L (ref 135–145)

## 2018-11-10 LAB — CBC
HCT: 37.3 % (ref 36.0–46.0)
HEMOGLOBIN: 11.3 g/dL — AB (ref 12.0–15.0)
MCH: 26.9 pg (ref 26.0–34.0)
MCHC: 30.3 g/dL (ref 30.0–36.0)
MCV: 88.8 fL (ref 80.0–100.0)
Platelets: 203 10*3/uL (ref 150–400)
RBC: 4.2 MIL/uL (ref 3.87–5.11)
RDW: 14.6 % (ref 11.5–15.5)
WBC: 11.7 10*3/uL — ABNORMAL HIGH (ref 4.0–10.5)
nRBC: 0 % (ref 0.0–0.2)

## 2018-11-10 MED ORDER — NOREPINEPHRINE 4 MG/250ML-% IV SOLN: 0.0000 ug/min | INTRAVENOUS | Status: DC

## 2018-11-10 MED ORDER — FOLIC ACID 1 MG PO TABS
1.0000 mg | ORAL_TABLET | Freq: Every day | ORAL | Status: DC
  Administered 2018-11-10 – 2018-11-22 (×13): 1 mg
  Filled 2018-11-10 (×14): qty 1

## 2018-11-10 MED ORDER — VITAMIN B-1 100 MG PO TABS
100.0000 mg | ORAL_TABLET | Freq: Every day | ORAL | Status: DC
  Administered 2018-11-10 – 2018-11-22 (×13): 100 mg
  Filled 2018-11-10 (×14): qty 1

## 2018-11-10 NOTE — Progress Notes (Signed)
NAME:  Courtney Terry, MRN:  [id number], DOB:  Sep 01, 1961, LOS: 2 ADMISSION DATE:  11/08/2018, CONSULTATION DATE:  11/08/18 REFERRING MD:  Karmen Bongo, MD CHIEF COMPLAINT:  Respiratory failure  Brief History   58 year old female with COPD (FEV1 25%), chronic hypoxemic respiratory failure, with recent hospitalization in December for COPD exacerbation who presents with SOB and AMS and found in acute on chronic hypoxemic and hypercarbic respiratory failure requiring intubation. Initially admitted by Hospitalist. PCCM consulted for admission to the intensive care unit after being intubated in the emergency room on 2/2  History of present illness   58 year old female with PMHx below who presents with worsening dyspnea and altered mental status. EMS was called and she was treated with bag mask ventilation, nebulizers, steroids and magnesium with improvement.  Patient was subsequently found to be hypercarbic and hypoxemic with a depressed mental status in the emergency room.  Patient required intubation in ED for respiratory failure. UDS positive for cocaine.  Of note she has a past medical history of severe COPD FEV1 25% predicted (spirometry 10/2016).  CTA completed in the emergency room negative for pulmonary embolism with advanced evidence of COPD.   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%) Chronic hypoxemic respiratory failure on home 2L Active tobacco use Polysubstance abuse Schizophrenia  Significant Hospital Events   2/2 Admitted and intubated  Consults:  PCCM  Procedures:  ETT 2/2>>  Significant Diagnostic Tests:  CTA 2/2:  Neg for PE, emphysema, multiple pulmonary nodules, ETT in place CT head w/o contrast 2/2: stable/normal  Micro Data:  BCx 2/2: no growth x24h Sputum cx 2/2: moderate gram positive cocci  RVP 2/2: negative Urine cx 2/2: >= 1000,000 unidentified organisms   Antimicrobials:  Azithromycin 11/08/2018>> CTX 11/09/18>>  Interim history/subjective:    Currently intubated and sedated due to agitation  Objective   Blood pressure 111/78, pulse 72, temperature 99 F (37.2 C), temperature source Oral, resp. rate 14, height 5\' 10"  (1.778 m), weight 47.4 kg, SpO2 100 %.    Vent Mode: PRVC FiO2 (%):  [40 %] 40 % Set Rate:  [14 bmp] 14 bmp Vt Set:  [470 mL] 470 mL PEEP:  [5 cmH20-8 cmH20] 5 cmH20 Plateau Pressure:  [16 cmH20-27 cmH20] 17 cmH20   Intake/Output Summary (Last 24 hours) at 11/10/2018 1024 Last data filed at 11/10/2018 0800 Gross per 24 hour  Intake 1885.93 ml  Output 1015 ml  Net 870.93 ml   Filed Weights   11/08/18 1600 11/09/18 0200 11/10/18 0130  Weight: 45.4 kg 46.1 kg 47.4 kg    Physical Exam: General: Cachectic female sedated on vent HEENT: Endotracheal tube to ventilator Neuro: Currently heavily sedated CV: Sounds are regular PULM: Rhonchi bilateral ZT:IWPY, non-tender, bsx4 active  Extremities: warm/dry, negative edema  Skin: no rashes or lesions   Resolved Hospital Problem list    Assessment & Plan:   Acute on chronic hypoxemic and hypercarbic respiratory failure requiring intubation and mechanical ventilation secondary to severe acute COPD Exacerbation in a patient with very severe COPD, prior FEV1 25% and severe COPD related cachexia LA downtrending 3.5>2.9 -Daily spontaneous breathing trial -Severe COPD will interfere with extubation - Steroids --Bronchodilators - PRN albuterol - Continue Zithromax day 2 of 5 on 11/10/2018 -Monitor culture data  Hypotension-improving Chronic hypotension on midodrine at home Unsure if this is related to baseline chronic hypotension or exacerbated by significant intrathoracic pressures due to her severe hyperexpansion and being placed on mechanical ventilation. -Continue  current antibiotics -On midodrine  Possible seizure-like activity valproic acid level <10, possibly non compliant at home  -PRN Ativan -Continue valproic acid -Turn for seizure  activity  Urine drug screen positive for cocaine History of polysubstance abuse and opiate abuse in the past. Current tobacco abuse history -Stop doing drugs consider rehab after this admission -Stop smoking  Schizophrenia PTSD Bipolar disease Home olanzapine 20mg  qhs, prozac 40mg  daily -Resume home psychotropic medications via tube feedings  Right breast cancer status post lumpectomy T1N0 Home meds: arimidex -Holding chemotherapy at this time  Hypothyroidism  TSH wnl on admission. Home meds synthroid 75 mcg  -Resume home Synthroid via tube at 75 mics daily  Nutrition Tube feedings Add thiamine and folic acid in the setting of cocaine abuse most likely abuses alcohol also.   Best practice:  Diet: NPO, adult tube feed protocol  Pain/Anxiety/Delirium protocol (if indicated): Sedation with fentanyl and Precedex VAP protocol (if indicated): Yes DVT prophylaxis: SQ Heparin GI prophylaxis: Famotidine Glucose control: SSI Mobility: BR Code Status: Full Family Communication: 11/10/2018 no family at bedside Disposition: Continue in ICU, continues to be intubated   Labs   CBC: Recent Labs  Lab 11/08/18 0429  11/08/18 1703 11/08/18 1719 11/09/18 0432 11/09/18 0449 11/10/18 0346 11/10/18 0355  WBC 9.9  --  5.1  --  8.7  --   --  11.7*  NEUTROABS 7.8*  --  4.2  --   --   --   --   --   HGB 12.2   < > 12.0 12.6 11.8* 12.6 12.6 11.3*  HCT 44.8   < > 41.9 37.0 39.9 37.0 37.0 37.3  MCV 98.0  --  94.4  --  92.4  --   --  88.8  PLT 217  --  198  --  206  --   --  203   < > = values in this interval not displayed.    Basic Metabolic Panel: Recent Labs  Lab 11/08/18 0429  11/08/18 1703 11/08/18 1719 11/09/18 0432 11/09/18 0449 11/10/18 0346 11/10/18 0355  NA 144   < > 142 139 143 139 139 139  K 3.8   < > 4.8 4.4 4.3 4.1 4.4 5.1  CL 94*  --  101  --  103  --   --  99  CO2 41*  --  25  --  29  --   --  32  GLUCOSE 156*  --  147*  --  173*  --   --  140*  BUN 11  --   15  --  27*  --   --  27*  CREATININE 0.47  --  0.63  --  0.62  --   --  0.50  CALCIUM 9.1  --  9.0  --  9.4  --   --  9.3  MG  --   --  2.1  --  2.3  --   --   --   PHOS  --   --  2.8  --  4.0  --   --   --    < > = values in this interval not displayed.   GFR: Estimated Creatinine Clearance: 58.8 mL/Courtney (by C-G formula based on SCr of 0.5 mg/dL). Recent Labs  Lab 11/08/18 0429 11/08/18 1703 11/08/18 2125 11/09/18 0432 11/10/18 0355  WBC 9.9 5.1  --  8.7 11.7*  LATICACIDVEN  --  3.5* 2.9*  --   --  Liver Function Tests: Recent Labs  Lab 11/08/18 1703  AST 22  ALT 14  ALKPHOS 65  BILITOT 0.8  PROT 6.2*  ALBUMIN 3.5   No results for input(s): LIPASE, AMYLASE in the last 168 hours. No results for input(s): AMMONIA in the last 168 hours.  ABG    Component Value Date/Time   PHART 7.441 11/10/2018 0346   PCO2ART 54.9 (H) 11/10/2018 0346   PO2ART 107.0 11/10/2018 0346   HCO3 37.3 (H) 11/10/2018 0346   TCO2 39 (H) 11/10/2018 0346   ACIDBASEDEF 0.4 06/30/2014 1033   O2SAT 98.0 11/10/2018 0346     Coagulation Profile: No results for input(s): INR, PROTIME in the last 168 hours.  Cardiac Enzymes: Recent Labs  Lab 11/08/18 0429 11/08/18 1703 11/08/18 2125 11/09/18 0432  TROPONINI <0.03 <0.03 <0.03 <0.03    HbA1C: Hgb A1c MFr Bld  Date/Time Value Ref Range Status  02/26/2016 04:15 PM 6.2 (H) 4.8 - 5.6 % Final    Comment:    (NOTE)         Pre-diabetes: 5.7 - 6.4         Diabetes: >6.4         Glycemic control for adults with diabetes: <7.0     CBG: Recent Labs  Lab 11/09/18 1546 11/09/18 2023 11/10/18 0004 11/10/18 0337 11/10/18 0728  GLUCAP 133* 123* 150* 128* 155*    App cct 30 Courtney   Richardson Landry Ethel Veronica ACNP Maryanna Shape PCCM Pager [phone] till 1 pm If no answer page [phone] 11/10/2018, 10:27 AM

## 2018-11-11 ENCOUNTER — Inpatient Hospital Stay (HOSPITAL_COMMUNITY): Payer: Medicaid Other

## 2018-11-11 LAB — BASIC METABOLIC PANEL
ANION GAP: 7 (ref 5–15)
BUN: 20 mg/dL (ref 6–20)
CO2: 35 mmol/L — ABNORMAL HIGH (ref 22–32)
Calcium: 9.2 mg/dL (ref 8.9–10.3)
Chloride: 98 mmol/L (ref 98–111)
Creatinine, Ser: 0.52 mg/dL (ref 0.44–1.00)
GFR calc non Af Amer: >60 mL/min (ref >60)
Glucose, Bld: 133 mg/dL — ABNORMAL HIGH (ref 70–99)
Potassium: 4.4 mmol/L (ref 3.5–5.1)
Sodium: 140 mmol/L (ref 135–145)

## 2018-11-11 LAB — GLUCOSE, CAPILLARY
GLUCOSE-CAPILLARY: 154 mg/dL — AB (ref 70–99)
Glucose-Capillary: 131 mg/dL — ABNORMAL HIGH (ref 70–99)
Glucose-Capillary: 133 mg/dL — ABNORMAL HIGH (ref 70–99)
Glucose-Capillary: 179 mg/dL — ABNORMAL HIGH (ref 70–99)
Glucose-Capillary: 133 mg/dL — ABNORMAL HIGH (ref 70–99)
Glucose-Capillary: 132 mg/dL — ABNORMAL HIGH (ref 70–99)

## 2018-11-11 LAB — CBC WITH DIFFERENTIAL/PLATELET
Abs Immature Granulocytes: 0.02 10*3/uL (ref 0.00–0.07)
Basophils Absolute: 0 10*3/uL (ref 0.0–0.1)
Basophils Relative: 0 %
Eosinophils Absolute: 0 10*3/uL (ref 0.0–0.5)
Eosinophils Relative: 0 %
HCT: 37.4 % (ref 36.0–46.0)
Hemoglobin: 11.5 g/dL — ABNORMAL LOW (ref 12.0–15.0)
IMMATURE GRANULOCYTES: 0 %
Lymphocytes Relative: 6 %
Lymphs Abs: 0.5 10*3/uL — ABNORMAL LOW (ref 0.7–4.0)
MCH: 27.4 pg (ref 26.0–34.0)
MCHC: 30.7 g/dL (ref 30.0–36.0)
MCV: 89.3 fL (ref 80.0–100.0)
Monocytes Absolute: 0.4 10*3/uL (ref 0.1–1.0)
Monocytes Relative: 6 %
NEUTROS PCT: 88 %
Neutro Abs: 6.8 10*3/uL (ref 1.7–7.7)
Platelets: 187 10*3/uL (ref 150–400)
RBC: 4.19 MIL/uL (ref 3.87–5.11)
RDW: 14.7 % (ref 11.5–15.5)
WBC: 7.8 10*3/uL (ref 4.0–10.5)
nRBC: 0 % (ref 0.0–0.2)

## 2018-11-11 LAB — CULTURE, RESPIRATORY W GRAM STAIN: Culture: NORMAL

## 2018-11-11 LAB — CULTURE, RESPIRATORY

## 2018-11-11 LAB — PHOSPHORUS: Phosphorus: 4.4 mg/dL (ref 2.5–4.6)

## 2018-11-11 LAB — MAGNESIUM: Magnesium: 2 mg/dL (ref 1.7–2.4)

## 2018-11-11 MED ORDER — INSULIN ASPART 100 UNIT/ML ~~LOC~~ SOLN
0.0000 [IU] | SUBCUTANEOUS | Status: DC
  Administered 2018-11-12 (×2): 2 [IU] via SUBCUTANEOUS
  Administered 2018-11-12: 1 [IU] via SUBCUTANEOUS
  Administered 2018-11-12 (×2): 2 [IU] via SUBCUTANEOUS
  Administered 2018-11-13: 1 [IU] via SUBCUTANEOUS
  Administered 2018-11-13: 2 [IU] via SUBCUTANEOUS
  Administered 2018-11-13 (×2): 1 [IU] via SUBCUTANEOUS
  Administered 2018-11-13: 2 [IU] via SUBCUTANEOUS
  Administered 2018-11-14 – 2018-11-16 (×9): 1 [IU] via SUBCUTANEOUS
  Administered 2018-11-16: 2 [IU] via SUBCUTANEOUS
  Administered 2018-11-17 – 2018-11-18 (×2): 1 [IU] via SUBCUTANEOUS
  Administered 2018-11-18 (×2): 2 [IU] via SUBCUTANEOUS
  Administered 2018-11-20 – 2018-11-21 (×3): 1 [IU] via SUBCUTANEOUS
  Administered 2018-11-22: 2 [IU] via SUBCUTANEOUS
  Administered 2018-11-26 – 2018-11-27 (×3): 1 [IU] via SUBCUTANEOUS

## 2018-11-11 MED ORDER — WHITE PETROLATUM EX OINT
TOPICAL_OINTMENT | CUTANEOUS | Status: AC
  Administered 2018-11-11: 0.2
  Filled 2018-11-11: qty 28.35

## 2018-11-11 MED ORDER — MIDAZOLAM 50MG/50ML (1MG/ML) PREMIX INFUSION
0.5000 mg/h | INTRAVENOUS | Status: DC
  Administered 2018-11-11: 0.5 mg/h via INTRAVENOUS
  Administered 2018-11-12 (×2): 3 mg/h via INTRAVENOUS
  Administered 2018-11-13 – 2018-11-15 (×3): 2 mg/h via INTRAVENOUS
  Administered 2018-11-15: 1 mg/h via INTRAVENOUS
  Administered 2018-11-16: 2 mg/h via INTRAVENOUS
  Administered 2018-11-17 – 2018-11-19 (×2): 3 mg/h via INTRAVENOUS
  Filled 2018-11-11 (×13): qty 50

## 2018-11-11 NOTE — Progress Notes (Signed)
Patient continues to be extremely agitated and impulsive. Patient biting tube, sitting up in the bed, and trying to pull out ETT even while restrained.Multiple doses of PRN meds having to be used. BP and HR elevated during periods of agitation.

## 2018-11-11 NOTE — Progress Notes (Addendum)
NAME:  Courtney Terry, MRN:  [id number], DOB:  March 20, 1961, LOS: 3 ADMISSION DATE:  11/08/2018, CONSULTATION DATE:  11/08/18 REFERRING MD:  Karmen Bongo, MD, CHIEF COMPLAINT:  Respiratory Failure    Brief History   58 year old female with COPD (FEV1 25%), chronic hypoxemic respiratory failure, with recent hospitalization in December for COPD exacerbation who presents with SOB and AMS and found in acute on chronic hypoxemic and hypercarbic respiratory failure requiring intubation. Initially admitted by Hospitalist. PCCM consulted for admission to the intensive care unit after being intubated in the emergency room on 2/2  History of present illness   58 year old female with PMHx below who presents with worsening dyspnea and altered mental status. EMS was called and she was treated with bag mask ventilation, nebulizers, steroids and magnesium with improvement.  Patient was subsequently found to be hypercarbic and hypoxemic with a depressed mental status in the emergency room.  Patient required intubation in ED for respiratory failure. UDS positive for cocaine.  Of note she has a past medical history of severe COPD FEV1 25% predicted (spirometry 10/2016).  CTA completed in the emergency room negative for pulmonary embolism with advanced evidence of COPD.   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%) Chronic hypoxemic respiratory failure on home 2L Active tobacco use Polysubstance abuse Schizophrenia  Significant Hospital Events   2/2 Admitted and intubated  Consults:  PCCM  Procedures:  ETT 2/2>>  Significant Diagnostic Tests:  TA 2/2:  Neg for PE, emphysema, multiple pulmonary nodules, ETT in place CT head w/o contrast 2/2: stable/normal EEG 2/3: moderate global encephalopathy,  No epileptiform activities   Micro Data:  BCx 2/2: no growth x2d Sputum cx 2/2: moderate gram positive cocci  RVP 2/2: negative Urine cx 2/2: >= 1000,000 unidentified organisms   Antimicrobials:    Azithromycin 11/08/2018>> CTX 11/09/18>>  Interim history/subjective:  Patient with ETT tube in place. More alert this morning.   Objective   Blood pressure 110/81, pulse 61, temperature 99.1 F (37.3 C), resp. rate 14, height 5\' 10"  (1.778 m), weight 48.1 kg, SpO2 100 %.    Vent Mode: PRVC FiO2 (%):  [30 %-40 %] 30 % Set Rate:  [14 bmp] 14 bmp Vt Set:  [470 mL] 470 mL PEEP:  [5 cmH20] 5 cmH20 Pressure Support:  [8 cmH20] 8 cmH20 Plateau Pressure:  [15 cmH20-17 cmH20] 16 cmH20   Intake/Output Summary (Last 24 hours) at 11/11/2018 0844 Last data filed at 11/11/2018 6073 Gross per 24 hour  Intake 2223.8 ml  Output 935 ml  Net 1288.8 ml   Filed Weights   11/10/18 0130 11/11/18 0031 11/11/18 0415  Weight: 47.4 kg 48.1 kg 48.1 kg    Examination: General: awake and alert, ETT tube in place, mech vent dependent  HENT: PERRL, no scleral icterus  Lungs: CTAB, no wheezes, rales, or rhonchi  Cardiovascular: RRR, no MRG  Abdomen: firm, non distended, bowel sounds normal  Extremities: no edema, non tender, 2+ DP pulses  Neuro: alert, nodding to questions  GU: foley in place   Resolved Hospital Problem list     Assessment & Plan:  Acute on chronic hypoxemic and hypercarbic respiratory failure requiring intubation and mechanical ventilation secondary to severe acute COPD Exacerbation in a patient with very severe COPD, prior FEV1 25% and severe COPD related cachexia LA downtrending 3.5>2.9 -Daily spontaneous breathing trial -Severe COPD will interfere with extubation -Continue solumedrol 80mg  q6h (2/2>>) --duoneb q6h - PRN albuterol - Continue Zithromax (  2/2>> for total of 5 doses)  --Continue CTX (2/3>>)  Hypotension-improving Chronic hypotension on midodrine at home Unsure if this is related to baseline chronic hypotension or exacerbated by significant intrathoracic pressures due to her severe hyperexpansion and being placed on mechanical ventilation. -Continue current  antibiotics --continue midodrine  Possible seizure-like activity valproic acid level <10, possibly non compliant at home. EEG negative for epileptiform activity  -PRN Ativan -Continue valproic acid -Turn for seizure activity  Urine drug screen positive for cocaine History of polysubstance abuse and opiate abuse in the past. Current tobacco abuse history -consider rehab after this admission -encourage smoking cessation   Schizophrenia PTSD Bipolar disease Home olanzapine 20mg  qhs, prozac 40mg  daily -holding home medications while NPO  Right breast cancer status post lumpectomy T1N0 Home meds: arimidex -Holding chemotherapy at this time  Hypothyroidism  TSH wnl on admission. Home meds synthroid 75 mcg  -continue IV synthroid 37.5 mcg daily   Nutrition Tube feedings Add thiamine and folic acid in the setting of substance use.  Best practice:  Diet: NPO, adult tube feed protocol  Pain/Anxiety/Delirium protocol (if indicated): Sedation w fentanyl and precedex VAP protocol (if indicated): Yes  DVT prophylaxis: SQ heparin  GI prophylaxis: famotidine  Glucose control: SSI  Mobility: BR  Code Status: Full  Family Communication: no family at bedside  Disposition: Continue in ICU, vent dependent   Labs   CBC: Recent Labs  Lab 11/08/18 0429  11/08/18 1703  11/09/18 0432 11/09/18 0449 11/10/18 0346 11/10/18 0355 11/11/18 0431  WBC 9.9  --  5.1  --  8.7  --   --  11.7* 7.8  NEUTROABS 7.8*  --  4.2  --   --   --   --   --  6.8  HGB 12.2   < > 12.0   < > 11.8* 12.6 12.6 11.3* 11.5*  HCT 44.8   < > 41.9   < > 39.9 37.0 37.0 37.3 37.4  MCV 98.0  --  94.4  --  92.4  --   --  88.8 89.3  PLT 217  --  198  --  206  --   --  203 187   < > = values in this interval not displayed.    Basic Metabolic Panel: Recent Labs  Lab 11/08/18 0429  11/08/18 1703  11/09/18 0432 11/09/18 0449 11/10/18 0346 11/10/18 0355 11/11/18 0431  NA 144   < > 142   < > 143 139 139 139  140  K 3.8   < > 4.8   < > 4.3 4.1 4.4 5.1 4.4  CL 94*  --  101  --  103  --   --  99 98  CO2 41*  --  25  --  29  --   --  32 35*  GLUCOSE 156*  --  147*  --  173*  --   --  140* 133*  BUN 11  --  15  --  27*  --   --  27* 20  CREATININE 0.47  --  0.63  --  0.62  --   --  0.50 0.52  CALCIUM 9.1  --  9.0  --  9.4  --   --  9.3 9.2  MG  --   --  2.1  --  2.3  --   --   --  2.0  PHOS  --   --  2.8  --  4.0  --   --   --  4.4   < > = values in this interval not displayed.   GFR: Estimated Creatinine Clearance: 59.6 mL/min (by C-G formula based on SCr of 0.52 mg/dL). Recent Labs  Lab 11/08/18 1703 11/08/18 2125 11/09/18 0432 11/10/18 0355 11/11/18 0431  WBC 5.1  --  8.7 11.7* 7.8  LATICACIDVEN 3.5* 2.9*  --   --   --     Liver Function Tests: Recent Labs  Lab 11/08/18 1703  AST 22  ALT 14  ALKPHOS 65  BILITOT 0.8  PROT 6.2*  ALBUMIN 3.5   No results for input(s): LIPASE, AMYLASE in the last 168 hours. No results for input(s): AMMONIA in the last 168 hours.  ABG    Component Value Date/Time   PHART 7.441 11/10/2018 0346   PCO2ART 54.9 (H) 11/10/2018 0346   PO2ART 107.0 11/10/2018 0346   HCO3 37.3 (H) 11/10/2018 0346   TCO2 39 (H) 11/10/2018 0346   ACIDBASEDEF 0.4 06/30/2014 1033   O2SAT 98.0 11/10/2018 0346     Coagulation Profile: No results for input(s): INR, PROTIME in the last 168 hours.  Cardiac Enzymes: Recent Labs  Lab 11/08/18 0429 11/08/18 1703 11/08/18 2125 11/09/18 0432  TROPONINI <0.03 <0.03 <0.03 <0.03    HbA1C: Hgb A1c MFr Bld  Date/Time Value Ref Range Status  02/26/2016 04:15 PM 6.2 (H) 4.8 - 5.6 % Final    Comment:    (NOTE)         Pre-diabetes: 5.7 - 6.4         Diabetes: >6.4         Glycemic control for adults with diabetes: <7.0     CBG: Recent Labs  Lab 11/10/18 1531 11/10/18 2012 11/11/18 0022 11/11/18 0411 11/11/18 0804  GLUCAP 145* 112* 154* 131* 133*    Review of Systems:   Unable to obtain as patient is  vent dependent and non verbal at this time   Critical care time: 30 min

## 2018-11-12 ENCOUNTER — Inpatient Hospital Stay (HOSPITAL_COMMUNITY): Payer: Medicaid Other

## 2018-11-12 LAB — CBC
HCT: 36 % (ref 36.0–46.0)
Hemoglobin: 11.3 g/dL — ABNORMAL LOW (ref 12.0–15.0)
MCH: 27.2 pg (ref 26.0–34.0)
MCHC: 31.4 g/dL (ref 30.0–36.0)
MCV: 86.7 fL (ref 80.0–100.0)
Platelets: 170 10*3/uL (ref 150–400)
RBC: 4.15 MIL/uL (ref 3.87–5.11)
RDW: 14.6 % (ref 11.5–15.5)
WBC: 8.7 10*3/uL (ref 4.0–10.5)
nRBC: 0.3 % — ABNORMAL HIGH (ref 0.0–0.2)

## 2018-11-12 LAB — GLUCOSE, CAPILLARY
GLUCOSE-CAPILLARY: 173 mg/dL — AB (ref 70–99)
GLUCOSE-CAPILLARY: 94 mg/dL (ref 70–99)
Glucose-Capillary: 122 mg/dL — ABNORMAL HIGH (ref 70–99)
Glucose-Capillary: 163 mg/dL — ABNORMAL HIGH (ref 70–99)
Glucose-Capillary: 174 mg/dL — ABNORMAL HIGH (ref 70–99)
Glucose-Capillary: 183 mg/dL — ABNORMAL HIGH (ref 70–99)

## 2018-11-12 LAB — BASIC METABOLIC PANEL
Anion gap: 12 (ref 5–15)
BUN: 20 mg/dL (ref 6–20)
CO2: 28 mmol/L (ref 22–32)
Calcium: 9.2 mg/dL (ref 8.9–10.3)
Chloride: 100 mmol/L (ref 98–111)
Creatinine, Ser: 0.35 mg/dL — ABNORMAL LOW (ref 0.44–1.00)
GFR calc Af Amer: >60 mL/min (ref >60)
Glucose, Bld: 127 mg/dL — ABNORMAL HIGH (ref 70–99)
Potassium: 3.9 mmol/L (ref 3.5–5.1)
SODIUM: 140 mmol/L (ref 135–145)

## 2018-11-12 MED ORDER — SENNOSIDES 8.8 MG/5ML PO SYRP
5.0000 mL | ORAL_SOLUTION | Freq: Two times a day (BID) | ORAL | Status: DC
  Administered 2018-11-12 – 2018-11-20 (×16): 5 mL
  Filled 2018-11-12 (×20): qty 5

## 2018-11-12 MED ORDER — METHYLPREDNISOLONE SODIUM SUCC 125 MG IJ SOLR
40.0000 mg | Freq: Four times a day (QID) | INTRAMUSCULAR | Status: DC
  Administered 2018-11-12 – 2018-11-14 (×8): 40 mg via INTRAVENOUS
  Filled 2018-11-12 (×8): qty 2

## 2018-11-12 MED ORDER — DOCUSATE SODIUM 50 MG/5ML PO LIQD
100.0000 mg | Freq: Two times a day (BID) | ORAL | Status: DC
  Administered 2018-11-12 – 2018-11-21 (×18): 100 mg
  Filled 2018-11-12 (×19): qty 10

## 2018-11-12 MED ORDER — VITAL AF 1.2 CAL PO LIQD
1000.0000 mL | ORAL | Status: DC
  Administered 2018-11-12 – 2018-11-19 (×7): 1000 mL
  Filled 2018-11-12 (×2): qty 1000

## 2018-11-12 NOTE — Progress Notes (Signed)
[facility] Progress Note Patient Name: Courtney Terry DOB: [birth year] MRN: [id number]   Date of Service  11/12/2018  HPI/Events of Note  Needs restraints order renewed  eICU Interventions  Order placed.        Kerry Kass Ogan 11/12/2018, 10:42 PM

## 2018-11-12 NOTE — Care Management Note (Signed)
Case Management Note  Patient Details  Name: Courtney Terry MRN: [id number] Date of Birth: 05-17-1961  Subjective/Objective:     Pt admitted with resp failure            Action/Plan:   PTA from home on 2 liters home oxygen.     Expected Discharge Date:                  Expected Discharge Plan:     In-House Referral:  Clinical Social Work  Discharge planning Services  CM Consult  Post Acute Care Choice:    Choice offered to:     DME Arranged:    DME Agency:     HH Arranged:    HH Agency:     Status of Service:     If discussed at [employer], dates discussed:    Additional Comments: 11/12/2018 Pt remains on ventilator Maryclare Labrador, RN 11/12/2018, 2:43 PM

## 2018-11-12 NOTE — Progress Notes (Signed)
NAME:  Courtney Terry, MRN:  [id number], DOB:  1961-04-26, LOS: 4 ADMISSION DATE:  11/08/2018, CONSULTATION DATE:  11/08/18 REFERRING MD:  Karmen Bongo, MD, CHIEF COMPLAINT:  Respiratory Failure    Brief History   58 year old female with COPD (FEV1 25%), chronic hypoxemic respiratory failure, with recent hospitalization in December for COPD exacerbation who presents with SOB and AMS and found in acute on chronic hypoxemic and hypercarbic respiratory failure requiring intubation. Initially admitted by Hospitalist. PCCM consulted for admission to the intensive care unit after being intubated in the emergency room on 2/2  History of present illness   58 year old female with PMHx below who presents with worsening dyspnea and altered mental status. EMS was called and she was treated with bag mask ventilation, nebulizers, steroids and magnesium with improvement.  Patient was subsequently found to be hypercarbic and hypoxemic with a depressed mental status in the emergency room.  Patient required intubation in ED for respiratory failure. UDS positive for cocaine.  Of note she has a past medical history of severe COPD FEV1 25% predicted (spirometry 10/2016).  CTA completed in the emergency room negative for pulmonary embolism with advanced evidence of COPD.   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%) Chronic hypoxemic respiratory failure on home 2L Active tobacco use Polysubstance abuse Schizophrenia  Significant Hospital Events   2/2 Admitted and intubated  Consults:  PCCM  Procedures:  ETT 2/2>>  Significant Diagnostic Tests:  TA 2/2:  Neg for PE, emphysema, multiple pulmonary nodules, ETT in place CT head w/o contrast 2/2: stable/normal EEG 2/3: moderate global encephalopathy,  No epileptiform activities   Micro Data:  BCx 2/2: no growth x2d Sputum cx 2/2: moderate gram positive cocci  RVP 2/2: negative Urine cx 2/2: >= 1000,000 unidentified organisms   Antimicrobials:    Azithromycin 11/08/2018>> CTX 11/09/18>>  Interim history/subjective:  Unable to obtain history as patient is sedated and mech vent dependent   Objective   Blood pressure 111/77, pulse 71, temperature 98.6 F (37 C), resp. rate 14, height 5\' 10"  (1.778 m), weight 51.1 kg, SpO2 100 %.    Vent Mode: PRVC FiO2 (%):  [30 %] 30 % Set Rate:  [14 bmp] 14 bmp Vt Set:  [470 mL] 470 mL PEEP:  [5 cmH20] 5 cmH20 Plateau Pressure:  [14 cmH20-18 cmH20] 18 cmH20   Intake/Output Summary (Last 24 hours) at 11/12/2018 4098 Last data filed at 11/12/2018 0700 Gross per 24 hour  Intake 2923.05 ml  Output 985 ml  Net 1938.05 ml   Filed Weights   11/11/18 0031 11/11/18 0415 11/12/18 0430  Weight: 48.1 kg 48.1 kg 51.1 kg    Examination: General: sedated, ETT in place, mech vent dependent  HENT: PERRL Lungs: CTAB, no wheezes, rales, or rhonchi  Cardiovascular: RRR, no MRG  Abdomen: firm, non distended, bowel sounds present  Extremities: no edema, SCDs in place  Neuro: sedated  GU: foley in place   Resolved Hospital Problem list     Assessment & Plan:  Acute on chronic hypoxemic and hypercarbic respiratory failure requiring intubation and mechanical ventilation secondary to severe acute COPD Exacerbation in a patient with very severe COPD, prior FEV1 25% and severe COPD related cachexia --Patient with increased agitation on 2/5, placed on Versed gtt, will wean as tolerated -Severe COPD will interfere with extubation -Continue solumedrol 80mg  q6h (2/2>>) --duoneb q6h - PRN albuterol - Zithromax to finish today (2/2>2/6)  --Continue CTX (2/3>>)    Hypotension-improving  Chronic hypotension on midodrine at home Unsure if this is related to baseline chronic hypotension or exacerbated by significant intrathoracic pressures due to her severe hyperexpansion and being placed on mechanical ventilation. -Continue current antibiotics --continue midodrine  Possible seizure-like activity valproic acid  level <10, possibly non compliant at home. EEG negative for epileptiform activity  -PRN Ativan -Continue valproic acid -Turn for seizure activity  Urine drug screen positive for cocaine History of polysubstance abuse and opiate abuse in the past. Current tobacco abuse history -consider rehab after this admission -encourage smoking cessation   Schizophrenia PTSD Bipolar disease Home olanzapine 20mg  qhs, prozac 40mg  daily -holding home medications while NPO  Right breast cancer status post lumpectomy T1N0 Home meds: arimidex -Holding chemotherapy at this time  Hypothyroidism  TSH wnl on admission. Home meds synthroid 75 mcg  -continue IV synthroid 37.5 mcg daily   Nutrition Tube feedings Add thiamine and folic acid in the setting of substance use.  Best practice:  Diet: NPO, adult tube feed protocol  Pain/Anxiety/Delirium protocol (if indicated): Sedation w fentanyl and precedex and versed gtt VAP protocol (if indicated): Yes  DVT prophylaxis: SQ heparin  GI prophylaxis: famotidine  Glucose control: SSI  Mobility: BR  Code Status: Full  Family Communication: family at bedside but asleep so unable to answer questions at this time  Disposition: Continue in ICU, vent dependent   Labs   CBC: Recent Labs  Lab 11/08/18 0429  11/08/18 1703  11/09/18 0432 11/09/18 0449 11/10/18 0346 11/10/18 0355 11/11/18 0431 11/12/18 0345  WBC 9.9  --  5.1  --  8.7  --   --  11.7* 7.8 8.7  NEUTROABS 7.8*  --  4.2  --   --   --   --   --  6.8  --   HGB 12.2   < > 12.0   < > 11.8* 12.6 12.6 11.3* 11.5* 11.3*  HCT 44.8   < > 41.9   < > 39.9 37.0 37.0 37.3 37.4 36.0  MCV 98.0  --  94.4  --  92.4  --   --  88.8 89.3 86.7  PLT 217  --  198  --  206  --   --  203 187 170   < > = values in this interval not displayed.    Basic Metabolic Panel: Recent Labs  Lab 11/08/18 1703  11/09/18 0432 11/09/18 0449 11/10/18 0346 11/10/18 0355 11/11/18 0431 11/12/18 0345  NA 142   < >  143 139 139 139 140 140  K 4.8   < > 4.3 4.1 4.4 5.1 4.4 3.9  CL 101  --  103  --   --  99 98 100  CO2 25  --  29  --   --  32 35* 28  GLUCOSE 147*  --  173*  --   --  140* 133* 127*  BUN 15  --  27*  --   --  27* 20 20  CREATININE 0.63  --  0.62  --   --  0.50 0.52 0.35*  CALCIUM 9.0  --  9.4  --   --  9.3 9.2 9.2  MG 2.1  --  2.3  --   --   --  2.0  --   PHOS 2.8  --  4.0  --   --   --  4.4  --    < > = values in this interval not displayed.   GFR: Estimated Creatinine  Clearance: 63.3 mL/min (A) (by C-G formula based on SCr of 0.35 mg/dL (L)). Recent Labs  Lab 11/08/18 1703 11/08/18 2125 11/09/18 0432 11/10/18 0355 11/11/18 0431 11/12/18 0345  WBC 5.1  --  8.7 11.7* 7.8 8.7  LATICACIDVEN 3.5* 2.9*  --   --   --   --     Liver Function Tests: Recent Labs  Lab 11/08/18 1703  AST 22  ALT 14  ALKPHOS 65  BILITOT 0.8  PROT 6.2*  ALBUMIN 3.5   No results for input(s): LIPASE, AMYLASE in the last 168 hours. No results for input(s): AMMONIA in the last 168 hours.  ABG    Component Value Date/Time   PHART 7.441 11/10/2018 0346   PCO2ART 54.9 (H) 11/10/2018 0346   PO2ART 107.0 11/10/2018 0346   HCO3 37.3 (H) 11/10/2018 0346   TCO2 39 (H) 11/10/2018 0346   ACIDBASEDEF 0.4 06/30/2014 1033   O2SAT 98.0 11/10/2018 0346     Coagulation Profile: No results for input(s): INR, PROTIME in the last 168 hours.  Cardiac Enzymes: Recent Labs  Lab 11/08/18 0429 11/08/18 1703 11/08/18 2125 11/09/18 0432  TROPONINI <0.03 <0.03 <0.03 <0.03    HbA1C: Hgb A1c MFr Bld  Date/Time Value Ref Range Status  02/26/2016 04:15 PM 6.2 (H) 4.8 - 5.6 % Final    Comment:    (NOTE)         Pre-diabetes: 5.7 - 6.4         Diabetes: >6.4         Glycemic control for adults with diabetes: <7.0     CBG: Recent Labs  Lab 11/11/18 1602 11/11/18 1927 11/11/18 2345 11/12/18 0400 11/12/18 0800  GLUCAP 133* 132* 183* 122* 174*    Review of Systems:   Unable to obtain as patient  is sedated  Critical care time: 30 min

## 2018-11-12 NOTE — Progress Notes (Signed)
WUA: Unable to wean gtts, pt becomes agitated, holds breath, bites tube, assessing very shallow lung sounds. Attempts to utilize therapeutic communication and relaxation ineffective. Lauren RT at bs and concurs that weaning not possible with current narcotic dependence/withdrawl.

## 2018-11-12 NOTE — Progress Notes (Signed)
Nutrition Follow-up  DOCUMENTATION CODES:   Severe malnutrition in context of chronic illness, Underweight  INTERVENTION:    Increase Vital AF 1.2 goal rate to 50 ml/h (1200 ml per day)  Provides 1440 kcal, 90 gm protein, 973 ml free water daily  NUTRITION DIAGNOSIS:   Severe Malnutrition related to chronic illness(COPD) as evidenced by severe fat depletion, severe muscle depletion.  Ongoing   GOAL:   Patient will meet greater than or equal to 90% of their needs  Met with TF  MONITOR:   Vent status, TF tolerance, Labs, I & O's  ASSESSMENT:   58 yo female with PMH of asthma, Bipolar D/O, PTSD, COPD, anemia, schizophrenia, hypothyroidism, HTN, seizures, breast cancer, cocaine abuse who was admitted with COPD exacerbation, VDRF.  Patient remains intubated on ventilator support MV: 6 L/min Temp (24hrs), Avg:99.2 F (37.3 C), Min:97.9 F (36.6 C), Max:100.2 F (37.9 C)   Labs reviewed. CBG's: [phone] Medications reviewed and include colace, folic acid, novolog, solumedrol, thiamine.  Patient is currently receiving Vital AF 1.2 via OGT at 45 ml/h (1080 ml/day) to provide 1296 kcals, 81 gm protein, 876 ml free water daily.     Diet Order:   Diet Order            Diet NPO time specified  Diet effective now              EDUCATION NEEDS:   No education needs have been identified at this time  Skin:  Skin Assessment: Reviewed RN Assessment  Last BM:  PTA  Height:   Ht Readings from Last 1 Encounters:  11/08/18 5' 10"  (1.778 m)    Weight:   Wt Readings from Last 1 Encounters:  11/12/18 51.1 kg    Ideal Body Weight:  68.2 kg  BMI:  Body mass index is 16.16 kg/m.  Estimated Nutritional Needs:   Kcal:  1440  Protein:  75-90 gm  Fluid:  1.5 L    Molli Barrows, RD, LDN, [place] Pager [phone] After Hours Pager [phone]

## 2018-11-13 ENCOUNTER — Inpatient Hospital Stay (HOSPITAL_COMMUNITY): Payer: Medicaid Other

## 2018-11-13 LAB — CULTURE, BLOOD (ROUTINE X 2)
Culture: NO GROWTH
Culture: NO GROWTH
SPECIAL REQUESTS: ADEQUATE
Special Requests: ADEQUATE

## 2018-11-13 LAB — BASIC METABOLIC PANEL
ANION GAP: 15 (ref 5–15)
BUN: 20 mg/dL (ref 6–20)
CO2: 29 mmol/L (ref 22–32)
Calcium: 9 mg/dL (ref 8.9–10.3)
Chloride: 97 mmol/L — ABNORMAL LOW (ref 98–111)
Creatinine, Ser: 0.49 mg/dL (ref 0.44–1.00)
GFR calc Af Amer: >60 mL/min (ref >60)
GFR calc non Af Amer: >60 mL/min (ref >60)
Glucose, Bld: 137 mg/dL — ABNORMAL HIGH (ref 70–99)
Potassium: 4 mmol/L (ref 3.5–5.1)
Sodium: 141 mmol/L (ref 135–145)

## 2018-11-13 LAB — CBC
HCT: 34.8 % — ABNORMAL LOW (ref 36.0–46.0)
Hemoglobin: 10.7 g/dL — ABNORMAL LOW (ref 12.0–15.0)
MCH: 26.5 pg (ref 26.0–34.0)
MCHC: 30.7 g/dL (ref 30.0–36.0)
MCV: 86.1 fL (ref 80.0–100.0)
Platelets: 158 10*3/uL (ref 150–400)
RBC: 4.04 MIL/uL (ref 3.87–5.11)
RDW: 15 % (ref 11.5–15.5)
WBC: 8.9 10*3/uL (ref 4.0–10.5)
nRBC: 0 % (ref 0.0–0.2)

## 2018-11-13 LAB — TRIGLYCERIDES: Triglycerides: 60 mg/dL (ref <150)

## 2018-11-13 LAB — VALPROIC ACID LEVEL: Valproic Acid Lvl: 52 ug/mL (ref 50.0–100.0)

## 2018-11-13 LAB — GLUCOSE, CAPILLARY
Glucose-Capillary: 118 mg/dL — ABNORMAL HIGH (ref 70–99)
Glucose-Capillary: 125 mg/dL — ABNORMAL HIGH (ref 70–99)
Glucose-Capillary: 130 mg/dL — ABNORMAL HIGH (ref 70–99)
Glucose-Capillary: 131 mg/dL — ABNORMAL HIGH (ref 70–99)
Glucose-Capillary: 151 mg/dL — ABNORMAL HIGH (ref 70–99)
Glucose-Capillary: 196 mg/dL — ABNORMAL HIGH (ref 70–99)

## 2018-11-13 MED ORDER — PROPOFOL 1000 MG/100ML IV EMUL
INTRAVENOUS | Status: AC
  Filled 2018-11-13: qty 100

## 2018-11-13 MED ORDER — BISACODYL 10 MG RE SUPP
10.0000 mg | Freq: Every day | RECTAL | Status: DC | PRN
  Administered 2018-11-16: 10 mg via RECTAL
  Filled 2018-11-13: qty 1

## 2018-11-13 MED ORDER — PROPOFOL 1000 MG/100ML IV EMUL
5.0000 ug/kg/min | INTRAVENOUS | Status: DC
  Administered 2018-11-13: 25 ug/kg/min via INTRAVENOUS
  Administered 2018-11-13 – 2018-11-14 (×3): 30 ug/kg/min via INTRAVENOUS
  Administered 2018-11-14 (×2): 35 ug/kg/min via INTRAVENOUS
  Filled 2018-11-13 (×8): qty 100

## 2018-11-13 MED ORDER — POLYETHYLENE GLYCOL 3350 17 G PO PACK
17.0000 g | PACK | Freq: Two times a day (BID) | ORAL | Status: DC
  Administered 2018-11-14 – 2018-11-20 (×11): 17 g via ORAL
  Filled 2018-11-13 (×19): qty 1

## 2018-11-13 MED ORDER — QUETIAPINE FUMARATE 100 MG PO TABS
100.0000 mg | ORAL_TABLET | Freq: Two times a day (BID) | ORAL | Status: DC
  Administered 2018-11-13 – 2018-11-22 (×20): 100 mg via ORAL
  Filled 2018-11-13 (×10): qty 1
  Filled 2018-11-13 (×2): qty 4
  Filled 2018-11-13: qty 1
  Filled 2018-11-13 (×2): qty 4
  Filled 2018-11-13 (×6): qty 1
  Filled 2018-11-13: qty 4

## 2018-11-13 NOTE — Progress Notes (Signed)
[facility] Progress Note Patient Name: Courtney Terry DOB: September 15, 1961 MRN: [id number]   Date of Service  11/13/2018  HPI/Events of Note  Pt extremely agitated and almost self-extubated despite maximum infusion rates of Precedex, Versed and Fentanyl  eICU Interventions  Propofol infusion ordered to be followed by weaning off [place] 11/13/2018, 1:03 AM

## 2018-11-13 NOTE — Progress Notes (Signed)
NAME:  Courtney Terry, MRN:  [id number], DOB:  Sep 26, 1961, LOS: 5 ADMISSION DATE:  11/08/2018, CONSULTATION DATE:  11/08/18 REFERRING MD:  Karmen Bongo, MD, CHIEF COMPLAINT:  Respiratory Failure    Brief History   58 year old female with COPD (FEV1 25%), chronic hypoxemic respiratory failure, with recent hospitalization in December for COPD exacerbation who presents with SOB and AMS and found in acute on chronic hypoxemic and hypercarbic respiratory failure requiring intubation. Initially admitted by Hospitalist. PCCM consulted for admission to the intensive care unit after being intubated in the emergency room on 2/2  History of present illness   58 year old female with PMHx below who presents with worsening dyspnea and altered mental status. EMS was called and she was treated with bag mask ventilation, nebulizers, steroids and magnesium with improvement.  Patient was subsequently found to be hypercarbic and hypoxemic with a depressed mental status in the emergency room.  Patient required intubation in ED for respiratory failure. UDS positive for cocaine.  Of note she has a past medical history of severe COPD FEV1 25% predicted (spirometry 10/2016).  CTA completed in the emergency room negative for pulmonary embolism with advanced evidence of COPD.   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%) Chronic hypoxemic respiratory failure on home 2L Active tobacco use Polysubstance abuse Schizophrenia  Significant Hospital Events   2/2 Admitted and intubated  Consults:  PCCM  Procedures:  ETT 2/2>>  Significant Diagnostic Tests:  TA 2/2:  Neg for PE, emphysema, multiple pulmonary nodules, ETT in place CT head w/o contrast 2/2: stable/normal EEG 2/3: moderate global encephalopathy,  No epileptiform activities   Micro Data:  BCx 2/2: no growth x2d Sputum cx 2/2: moderate gram positive cocci  RVP 2/2: negative Urine cx 2/2: >= 1000,000 unidentified organisms   Antimicrobials:    Azithromycin 11/08/2018>> CTX 11/09/18>>  Interim history/subjective:  Unable to obtain history from patient as she is sedated. ON patient very agitated. Per RN required max dose sedation and then propofol gtt was ordered. Patient breath holding, biting tube, and attempting to pull ETT out.   Objective   Blood pressure (!) 88/53, pulse 63, temperature 98.2 F (36.8 C), resp. rate 14, height 5\' 10"  (1.778 m), weight 51.8 kg, SpO2 95 %.    Vent Mode: PRVC FiO2 (%):  [30 %] 30 % Set Rate:  [14 bmp] 14 bmp Vt Set:  [470 mL] 470 mL PEEP:  [5 cmH20] 5 cmH20 Plateau Pressure:  [15 cmH20-18 cmH20] 17 cmH20   Intake/Output Summary (Last 24 hours) at 11/13/2018 0835 Last data filed at 11/13/2018 0700 Gross per 24 hour  Intake 2866.91 ml  Output 1200 ml  Net 1666.91 ml   Filed Weights   11/11/18 0415 11/12/18 0430 11/13/18 0422  Weight: 48.1 kg 51.1 kg 51.8 kg    Examination: General: sedated, ETT in place, mech vent depended  HENT: PERRL, no scleral icterus  Lungs: CTAB, no wheezes, rales, or rhonchi  Cardiovascular: RRR, no MRG  Abdomen: soft, non tender, non distended, bowel sounds present  Extremities: no edema  Neuro: sedated  GU: foley in place  Resolved Hospital Problem list    Assessment & Plan:  Acute on chronic hypoxemic and hypercarbic respiratory failure requiring intubation and mechanical ventilation secondary to severe acute COPD Exacerbation in a patient with very severe COPD, prior FEV1 25% and severe COPD related cachexia --Patient with increased agitation on 2/5, placed on Versed gtt. Continued to be agitated overnight. Propofol infusion  ordered when weaned off precedex  -Severe COPD will interfere with extubation -Continue solumedrol 40mg  q6h (2/2>>) --duoneb q6h - PRN albuterol --Continue CTX (2/3>>)    Hypotension Remains hypotensive to 84/53. Overnight low of 70/51. Chronic hypotension on midodrine at home Unsure if this is related to baseline chronic  hypotension or exacerbated by significant intrathoracic pressures due to her severe hyperexpansion and being placed on mechanical ventilation. -Continue current antibiotics --continue midodrine  Possible seizure-like activity valproic acid level <10, possibly non compliant at home. Valproic acid appears to be for mood disorder rather than seizure activity. EEG negative for epileptiform activity  -PRN Ativan -Continue valproic acid -Turn for seizure activity  Urine drug screen positive for cocaine History of polysubstance abuse and opiate abuse in the past. Current tobacco abuse history -consider rehab after this admission -encourage smoking cessation   Schizophrenia PTSD Bipolar disease Patient with increased agitation overnight  Home olanzapine 20mg  qhs, prozac 40mg  daily, valproic acid  -holding home medications while NPO -continue valproic acid -can consider adding seroquel for agitation as patient has antipsychotic listed on home meds   Right breast cancer status post lumpectomy T1N0 Home meds: arimidex -Holding chemotherapy at this time  Hypothyroidism  TSH wnl on admission. Home meds synthroid 75 mcg  -continue IV synthroid 37.5 mcg daily   Constipation No recorded BM since 2/6.  -continue docusate and senna -can consider further PRN stool softeners if no further BM  Nutrition Tube feedings Add thiamine and folic acid in the setting of substance use.  Best practice:  Diet: NPO, adult tube feed protocol  Pain/Anxiety/Delirium protocol (if indicated): Sedation w fentanyl and precedex and versed gtt and propofol gtt  VAP protocol (if indicated): Yes  DVT prophylaxis: SQ heparin  GI prophylaxis: famotidine  Glucose control: SSI  Mobility: BR  Code Status: Full  Family Communication: discussed with family at bedside, no questions  Disposition: Continue in ICU, vent dependent   Labs   CBC: Recent Labs  Lab 11/08/18 0429  11/08/18 1703  11/09/18 0432   11/10/18 0346 11/10/18 0355 11/11/18 0431 11/12/18 0345 11/13/18 0336  WBC 9.9  --  5.1  --  8.7  --   --  11.7* 7.8 8.7 8.9  NEUTROABS 7.8*  --  4.2  --   --   --   --   --  6.8  --   --   HGB 12.2   < > 12.0   < > 11.8*   < > 12.6 11.3* 11.5* 11.3* 10.7*  HCT 44.8   < > 41.9   < > 39.9   < > 37.0 37.3 37.4 36.0 34.8*  MCV 98.0  --  94.4  --  92.4  --   --  88.8 89.3 86.7 86.1  PLT 217  --  198  --  206  --   --  203 187 170 158   < > = values in this interval not displayed.    Basic Metabolic Panel: Recent Labs  Lab 11/08/18 1703  11/09/18 0432  11/10/18 0346 11/10/18 0355 11/11/18 0431 11/12/18 0345 11/13/18 0336  NA 142   < > 143   < > 139 139 140 140 141  K 4.8   < > 4.3   < > 4.4 5.1 4.4 3.9 4.0  CL 101  --  103  --   --  99 98 100 97*  CO2 25  --  29  --   --  32 35*  28 29  GLUCOSE 147*  --  173*  --   --  140* 133* 127* 137*  BUN 15  --  27*  --   --  27* 20 20 20   CREATININE 0.63  --  0.62  --   --  0.50 0.52 0.35* 0.49  CALCIUM 9.0  --  9.4  --   --  9.3 9.2 9.2 9.0  MG 2.1  --  2.3  --   --   --  2.0  --   --   PHOS 2.8  --  4.0  --   --   --  4.4  --   --    < > = values in this interval not displayed.   GFR: Estimated Creatinine Clearance: 64.2 mL/min (by C-G formula based on SCr of 0.49 mg/dL). Recent Labs  Lab 11/08/18 1703 11/08/18 2125  11/10/18 0355 11/11/18 0431 11/12/18 0345 11/13/18 0336  WBC 5.1  --    < > 11.7* 7.8 8.7 8.9  LATICACIDVEN 3.5* 2.9*  --   --   --   --   --    < > = values in this interval not displayed.    Liver Function Tests: Recent Labs  Lab 11/08/18 1703  AST 22  ALT 14  ALKPHOS 65  BILITOT 0.8  PROT 6.2*  ALBUMIN 3.5   No results for input(s): LIPASE, AMYLASE in the last 168 hours. No results for input(s): AMMONIA in the last 168 hours.  ABG    Component Value Date/Time   PHART 7.441 11/10/2018 0346   PCO2ART 54.9 (H) 11/10/2018 0346   PO2ART 107.0 11/10/2018 0346   HCO3 37.3 (H) 11/10/2018 0346   TCO2  39 (H) 11/10/2018 0346   ACIDBASEDEF 0.4 06/30/2014 1033   O2SAT 98.0 11/10/2018 0346     Coagulation Profile: No results for input(s): INR, PROTIME in the last 168 hours.  Cardiac Enzymes: Recent Labs  Lab 11/08/18 0429 11/08/18 1703 11/08/18 2125 11/09/18 0432  TROPONINI <0.03 <0.03 <0.03 <0.03    HbA1C: Hgb A1c MFr Bld  Date/Time Value Ref Range Status  02/26/2016 04:15 PM 6.2 (H) 4.8 - 5.6 % Final    Comment:    (NOTE)         Pre-diabetes: 5.7 - 6.4         Diabetes: >6.4         Glycemic control for adults with diabetes: <7.0     CBG: Recent Labs  Lab 11/12/18 1505 11/12/18 2001 11/13/18 0036 11/13/18 0452 11/13/18 0802  GLUCAP 173* 94 151* 131* 130*    Review of Systems:   Unable to obtain as patient is sedated   Critical care time: 30 min

## 2018-11-14 ENCOUNTER — Inpatient Hospital Stay (HOSPITAL_COMMUNITY): Payer: Medicaid Other

## 2018-11-14 LAB — BASIC METABOLIC PANEL
Anion gap: 13 (ref 5–15)
BUN: 18 mg/dL (ref 6–20)
CALCIUM: 8.9 mg/dL (ref 8.9–10.3)
CHLORIDE: 98 mmol/L (ref 98–111)
CO2: 29 mmol/L (ref 22–32)
CREATININE: 0.43 mg/dL — AB (ref 0.44–1.00)
GFR calc Af Amer: >60 mL/min (ref >60)
GFR calc non Af Amer: >60 mL/min (ref >60)
Glucose, Bld: 116 mg/dL — ABNORMAL HIGH (ref 70–99)
Potassium: 3.8 mmol/L (ref 3.5–5.1)
Sodium: 140 mmol/L (ref 135–145)

## 2018-11-14 LAB — GLUCOSE, CAPILLARY
GLUCOSE-CAPILLARY: 143 mg/dL — AB (ref 70–99)
GLUCOSE-CAPILLARY: 121 mg/dL — AB (ref 70–99)
Glucose-Capillary: 114 mg/dL — ABNORMAL HIGH (ref 70–99)
Glucose-Capillary: 135 mg/dL — ABNORMAL HIGH (ref 70–99)
Glucose-Capillary: 135 mg/dL — ABNORMAL HIGH (ref 70–99)
Glucose-Capillary: 98 mg/dL (ref 70–99)

## 2018-11-14 LAB — CBC
HCT: 37.4 % (ref 36.0–46.0)
Hemoglobin: 12.1 g/dL (ref 12.0–15.0)
MCH: 28 pg (ref 26.0–34.0)
MCHC: 32.4 g/dL (ref 30.0–36.0)
MCV: 86.6 fL (ref 80.0–100.0)
Platelets: 148 10*3/uL — ABNORMAL LOW (ref 150–400)
RBC: 4.32 MIL/uL (ref 3.87–5.11)
RDW: 15.2 % (ref 11.5–15.5)
WBC: 16 10*3/uL — ABNORMAL HIGH (ref 4.0–10.5)
nRBC: 0 % (ref 0.0–0.2)

## 2018-11-14 MED ORDER — METHYLPREDNISOLONE SODIUM SUCC 125 MG IJ SOLR
40.0000 mg | Freq: Every day | INTRAMUSCULAR | Status: DC
  Administered 2018-11-15 – 2018-11-22 (×8): 40 mg via INTRAVENOUS
  Filled 2018-11-14 (×8): qty 2

## 2018-11-14 MED ORDER — DEXMEDETOMIDINE HCL IN NACL 400 MCG/100ML IV SOLN
0.4000 ug/kg/h | INTRAVENOUS | Status: DC
  Administered 2018-11-14: 1 ug/kg/h via INTRAVENOUS
  Administered 2018-11-15: 1.7 ug/kg/h via INTRAVENOUS
  Administered 2018-11-15 (×4): 1.8 ug/kg/h via INTRAVENOUS
  Administered 2018-11-16 (×4): 1.7 ug/kg/h via INTRAVENOUS
  Administered 2018-11-17 (×2): 1.8 ug/kg/h via INTRAVENOUS
  Administered 2018-11-17 (×2): 1.7 ug/kg/h via INTRAVENOUS
  Administered 2018-11-18 – 2018-11-19 (×7): 1.8 ug/kg/h via INTRAVENOUS
  Filled 2018-11-14 (×7): qty 100
  Filled 2018-11-14: qty 200
  Filled 2018-11-14 (×14): qty 100

## 2018-11-14 NOTE — Progress Notes (Signed)
NAME:  Courtney Terry, MRN:  [id number], DOB:  1961/05/04, LOS: 6 ADMISSION DATE:  11/08/2018, CONSULTATION DATE:  11/08/18 REFERRING MD:  Karmen Bongo, MD, CHIEF COMPLAINT:  Respiratory Failure    Brief History   58 year old female with COPD (FEV1 25%), chronic hypoxemic respiratory failure, with recent hospitalization in December for COPD exacerbation who presents with SOB and AMS and found in acute on chronic hypoxemic and hypercarbic respiratory failure requiring intubation. Initially admitted by Hospitalist. 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%) Chronic hypoxemic respiratory failure on home 2L Active tobacco use Polysubstance abuse Schizophrenia  Significant Hospital Events   2/2 Admitted and intubated  Consults:  PCCM  Procedures:  ETT 2/2>>  Significant Diagnostic Tests:  CTA 2/2:  Neg for PE, emphysema, multiple pulmonary nodules, ETT in place CT head w/o contrast 2/2: stable/normal EEG 2/3: moderate global encephalopathy,  No epileptiform activities   UDS 2/2 > cocaine  Micro Data:  BCx 2/2: no growth x2d Sputum cx 2/2: moderate gram positive cocci  RVP 2/2: negative Urine cx 2/2: >= 1000,000 unidentified organisms   Antimicrobials:  Azithromycin 11/08/2018>> CTX 11/09/18>>  Interim history/subjective:  Remains on the ventilator requiring high sedation for agitation.  Objective   Blood pressure 107/66, pulse 87, temperature 99.3 F (37.4 C), resp. rate 19, height 5\' 10"  (1.778 m), weight 52.8 kg, SpO2 92 %.    Vent Mode: PRVC FiO2 (%):  [30 %] 30 % Set Rate:  [14 bmp] 14 bmp Vt Set:  [470 mL] 470 mL PEEP:  [5 cmH20] 5 cmH20 Pressure Support:  [5 cmH20] 5 cmH20 Plateau Pressure:  [10 cmH20-18 cmH20] 18 cmH20   Intake/Output Summary (Last 24 hours) at 11/14/2018 1021 Last data filed at 11/14/2018 1000 Gross per 24 hour  Intake 2049.9 ml    Output 1375 ml  Net 674.9 ml   Filed Weights   11/12/18 0430 11/13/18 0422 11/14/18 0500  Weight: 51.1 kg 51.8 kg 52.8 kg    Examination: Gen:      No acute distress HEENT:  EOMI, sclera anicteric, ETT Neck:     No masses; no thyromegaly Lungs:    Clear to auscultation bilaterally; normal respiratory effort CV:         Regular rate and rhythm; no murmurs Abd:      + bowel sounds; soft, non-tender; no palpable masses, no distension Ext:    No edema; adequate peripheral perfusion Skin:      Warm and dry; no rash Neuro: Sedated, unresponsive  Resolved Hospital Problem list     Assessment & Plan:  Acute on chronic hypoxemic and hypercarbic respiratory failure requiring intubation and mechanical ventilation secondary to severe acute COPD Exacerbation in a patient with very severe COPD, prior FEV1 25% and severe COPD related cachexia Continues on the vent We will attempt to wean down sedation Reduce Solu-Medrol, duo nebs Continue ceftriaxone.  Finished azithromycin.   Chronic hypertension Continue midodrine.  Possible seizure-like activity valproic acid level <10, possibly non compliant at home. Valproic acid appears to be for mood disorder rather than seizure activity. EEG negative for epileptiform activity  -PRN Ativan -Continue valproic acid  Urine drug screen positive for cocaine History of polysubstance abuse and opiate abuse in the past. Current tobacco abuse history -consider rehab after this admission -encourage smoking cessation   Schizophrenia PTSD Bipolar disease Patient with  increased agitation overnight  Home olanzapine 20mg  qhs, prozac 40mg  daily, valproic acid  -holding home medications while NPO -continue valproic acid -can consider adding seroquel for agitation as patient has antipsychotic listed on home meds   Right breast cancer status post lumpectomy T1N0 Home meds: arimidex -Holding chemotherapy at this time  Hypothyroidism  TSH wnl on  admission. Home meds synthroid 75 mcg  -continue IV synthroid 37.5 mcg daily   Constipation No recorded BM since 2/6.  -continue docusate and senna -can consider further PRN stool softeners if no further BM  Nutrition Tube feedings Add thiamine and folic acid in the setting of substance use.  Best practice:  Diet: NPO, adult tube feed protocol  Pain/Anxiety/Delirium protocol (if indicated): Sedation w fentanyl and precedex and versed gtt and propofol gtt  VAP protocol (if indicated): Yes  DVT prophylaxis: SQ heparin  GI prophylaxis: famotidine  Glucose control: SSI  Mobility: BR  Code Status: Full  Family Communication: discussed with family at bedside, no questions  Disposition: Continue in ICU, vent dependent   Labs   CBC: Recent Labs  Lab 11/08/18 0429  11/08/18 1703  11/10/18 0355 11/11/18 0431 11/12/18 0345 11/13/18 0336 11/14/18 0553  WBC 9.9  --  5.1   < > 11.7* 7.8 8.7 8.9 16.0*  NEUTROABS 7.8*  --  4.2  --   --  6.8  --   --   --   HGB 12.2   < > 12.0   < > 11.3* 11.5* 11.3* 10.7* 12.1  HCT 44.8   < > 41.9   < > 37.3 37.4 36.0 34.8* 37.4  MCV 98.0  --  94.4   < > 88.8 89.3 86.7 86.1 86.6  PLT 217  --  198   < > 203 187 170 158 148*   < > = values in this interval not displayed.    Basic Metabolic Panel: Recent Labs  Lab 11/08/18 1703  11/09/18 0432  11/10/18 0355 11/11/18 0431 11/12/18 0345 11/13/18 0336 11/14/18 0312  NA 142   < > 143   < > 139 140 140 141 140  K 4.8   < > 4.3   < > 5.1 4.4 3.9 4.0 3.8  CL 101  --  103  --  99 98 100 97* 98  CO2 25  --  29  --  32 35* 28 29 29   GLUCOSE 147*  --  173*  --  140* 133* 127* 137* 116*  BUN 15  --  27*  --  27* 20 20 20 18   CREATININE 0.63  --  0.62  --  0.50 0.52 0.35* 0.49 0.43*  CALCIUM 9.0  --  9.4  --  9.3 9.2 9.2 9.0 8.9  MG 2.1  --  2.3  --   --  2.0  --   --   --   PHOS 2.8  --  4.0  --   --  4.4  --   --   --    < > = values in this interval not displayed.   GFR: Estimated Creatinine  Clearance: 65.5 mL/min (A) (by C-G formula based on SCr of 0.43 mg/dL (L)). Recent Labs  Lab 11/08/18 1703 11/08/18 2125  11/11/18 0431 11/12/18 0345 11/13/18 0336 11/14/18 0553  WBC 5.1  --    < > 7.8 8.7 8.9 16.0*  LATICACIDVEN 3.5* 2.9*  --   --   --   --   --    < > =  values in this interval not displayed.    Liver Function Tests: Recent Labs  Lab 11/08/18 1703  AST 22  ALT 14  ALKPHOS 65  BILITOT 0.8  PROT 6.2*  ALBUMIN 3.5   No results for input(s): LIPASE, AMYLASE in the last 168 hours. No results for input(s): AMMONIA in the last 168 hours.  ABG    Component Value Date/Time   PHART 7.441 11/10/2018 0346   PCO2ART 54.9 (H) 11/10/2018 0346   PO2ART 107.0 11/10/2018 0346   HCO3 37.3 (H) 11/10/2018 0346   TCO2 39 (H) 11/10/2018 0346   ACIDBASEDEF 0.4 06/30/2014 1033   O2SAT 98.0 11/10/2018 0346     Coagulation Profile: No results for input(s): INR, PROTIME in the last 168 hours.  Cardiac Enzymes: Recent Labs  Lab 11/08/18 0429 11/08/18 1703 11/08/18 2125 11/09/18 0432  TROPONINI <0.03 <0.03 <0.03 <0.03    HbA1C: Hgb A1c MFr Bld  Date/Time Value Ref Range Status  02/26/2016 04:15 PM 6.2 (H) 4.8 - 5.6 % Final    Comment:    (NOTE)         Pre-diabetes: 5.7 - 6.4         Diabetes: >6.4         Glycemic control for adults with diabetes: <7.0     CBG: Recent Labs  Lab 11/13/18 1553 11/13/18 2051 11/14/18 0028 11/14/18 0412 11/14/18 0744  GLUCAP 196* 125* 135* 114* 143*   The patient is critically ill with multiple organ system failure and requires high complexity decision making for assessment and support, frequent evaluation and titration of therapies, advanced monitoring, review of radiographic studies and interpretation of complex data.   Critical Care Time devoted to patient care services, exclusive of separately billable procedures, described in this note is 35 minutes.   Marshell Garfinkel MD [facility] Pager  [phone] If no answer call 336 [phone] 11/14/2018, 10:48 AM

## 2018-11-15 ENCOUNTER — Inpatient Hospital Stay (HOSPITAL_COMMUNITY): Payer: Medicaid Other

## 2018-11-15 LAB — BASIC METABOLIC PANEL
Anion gap: 13 (ref 5–15)
BUN: 20 mg/dL (ref 6–20)
CO2: 28 mmol/L (ref 22–32)
Calcium: 8.9 mg/dL (ref 8.9–10.3)
Chloride: 101 mmol/L (ref 98–111)
Creatinine, Ser: 0.44 mg/dL (ref 0.44–1.00)
GFR calc Af Amer: >60 mL/min (ref >60)
GFR calc non Af Amer: >60 mL/min (ref >60)
GLUCOSE: 86 mg/dL (ref 70–99)
Potassium: 4.1 mmol/L (ref 3.5–5.1)
Sodium: 142 mmol/L (ref 135–145)

## 2018-11-15 LAB — CBC
HCT: 38 % (ref 36.0–46.0)
Hemoglobin: 12.3 g/dL (ref 12.0–15.0)
MCH: 27.8 pg (ref 26.0–34.0)
MCHC: 32.4 g/dL (ref 30.0–36.0)
MCV: 86 fL (ref 80.0–100.0)
PLATELETS: DECREASED 10*3/uL (ref 150–400)
RBC: 4.42 MIL/uL (ref 3.87–5.11)
RDW: 15.6 % — ABNORMAL HIGH (ref 11.5–15.5)
WBC: 12.9 10*3/uL — ABNORMAL HIGH (ref 4.0–10.5)
nRBC: 0 % (ref 0.0–0.2)

## 2018-11-15 LAB — PHOSPHORUS: Phosphorus: 3.5 mg/dL (ref 2.5–4.6)

## 2018-11-15 LAB — MAGNESIUM: Magnesium: 2.2 mg/dL (ref 1.7–2.4)

## 2018-11-15 LAB — GLUCOSE, CAPILLARY
GLUCOSE-CAPILLARY: 94 mg/dL (ref 70–99)
Glucose-Capillary: 95 mg/dL (ref 70–99)
Glucose-Capillary: 96 mg/dL (ref 70–99)
Glucose-Capillary: 132 mg/dL — ABNORMAL HIGH (ref 70–99)
Glucose-Capillary: 142 mg/dL — ABNORMAL HIGH (ref 70–99)
Glucose-Capillary: 108 mg/dL — ABNORMAL HIGH (ref 70–99)

## 2018-11-15 MED ORDER — FLUOXETINE HCL 20 MG PO CAPS
40.0000 mg | ORAL_CAPSULE | Freq: Every day | ORAL | Status: DC
  Administered 2018-11-15 – 2018-11-27 (×12): 40 mg
  Filled 2018-11-15 (×15): qty 2

## 2018-11-15 NOTE — Progress Notes (Signed)
NAME:  Courtney Terry, MRN:  [id number], DOB:  12/05/1960, LOS: 7 ADMISSION DATE:  11/08/2018, CONSULTATION DATE:  11/08/18 REFERRING MD:  Karmen Bongo, MD, CHIEF COMPLAINT:  Respiratory Failure    Brief History   58 year old female with COPD (FEV1 25%), chronic hypoxemic respiratory failure, with recent hospitalization in December for COPD exacerbation who presents with SOB and AMS and found in acute on chronic hypoxemic and hypercarbic respiratory failure requiring intubation. Initially admitted by Hospitalist. 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 RVP 2/2: negative Urine cx 2/2: >= 1000,000 diphtheroids  Antimicrobials:  Azithromycin 11/08/2018>> 2/6 CTX 11/09/18>> 2/7  Interim history/subjective:  Remains on the ventilator, intermittently agitated requiring high sedation.  Objective   Blood pressure (!) 90/59, pulse 83, temperature 98.4 F (36.9 C), resp. rate 14, height 5\' 10"  (1.778 m), weight 52.6 kg, SpO2 91 %.    Vent Mode: PRVC FiO2 (%):  [30 %] 30 % Set Rate:  [14 bmp] 14 bmp Vt Set:  [470 mL-540 mL] 540 mL PEEP:  [5 cmH20] 5 cmH20 Pressure Support:  [10 cmH20] 10 cmH20 Plateau Pressure:  [14 cmH20-20 cmH20] 20 cmH20   Intake/Output Summary (Last 24 hours) at 11/15/2018 0859 Last data filed at 11/15/2018 0844 Gross per 24 hour    Intake 2503.53 ml  Output 1215 ml  Net 1288.53 ml   Filed Weights   11/13/18 0422 11/14/18 0500 11/15/18 0418  Weight: 51.8 kg 52.8 kg 52.6 kg    Examination: Gen:      No acute distress HEENT:  EOMI, sclera anicteric, ETT Neck:     No masses; no thyromegaly Lungs:    Clear to auscultation bilaterally; normal respiratory effort CV:         Regular rate and rhythm; no murmurs Abd:      + bowel sounds; soft, non-tender; no palpable masses, no distension Ext:    No edema; adequate peripheral perfusion Skin:      Warm and dry; no rash Neuro: Sedated, unresponsive  Resolved Hospital Problem list     Assessment & Plan:  Acute on chronic hypoxemic and hypercarbic respiratory failure requiring intubation and mechanical ventilation secondary to severe acute COPD Exacerbation in a patient with very severe COPD, prior FEV1 25% and severe COPD related cachexia Reduce sedation, attempt pressure support weans Continue Solu-Medrol, DuoNeb Finished Ceftriaxone, azithromycin.  Chronic hypertension Continue midodrine.  Possible seizure-like activity valproic acid level <10, possibly non compliant at home. Valproic acid appears to be for mood disorder rather than seizure activity. EEG negative for epileptiform activity  -PRN Ativan -Continue valproic acid  Urine drug screen positive for cocaine History of polysubstance abuse and opiate abuse in the past. Current tobacco abuse history -consider rehab after this admission -encourage smoking cessation   Schizophrenia PTSD Bipolar disease Restart home prozac 40mg  daily, valproic acid  Holding olazapine Added  seroquel 2/7  Right breast cancer status post lumpectomy T1N0 Home meds: arimidex Holding chemotherapy at this time  Hypothyroidism  TSH wnl on admission. Home meds synthroid 75 mcg  Continue IV synthroid 37.5 mcg daily   Constipation No recorded BM since 2/6.  -continue docusate and senna -can consider further PRN  stool softeners if no further BM  Nutrition Tube feedings Add thiamine and folic acid in the setting of substance use.  Best practice:  Diet: NPO, adult tube feed protocol  Pain/Anxiety/Delirium protocol (if indicated): Sedation w fentanyl and precedex gtt VAP protocol (if indicated): Yes  DVT prophylaxis: SQ heparin  GI prophylaxis: famotidine  Glucose control: SSI  Mobility: BR  Code Status: Full  Family Communication: discussed with family at bedside, no questions  Disposition: Continue in ICU, vent dependent   Labs   CBC: Recent Labs  Lab 11/08/18 1703  11/11/18 0431 11/12/18 0345 11/13/18 0336 11/14/18 0553 11/15/18 0405  WBC 5.1   < > 7.8 8.7 8.9 16.0* PENDING  NEUTROABS 4.2  --  6.8  --   --   --   --   HGB 12.0   < > 11.5* 11.3* 10.7* 12.1 12.3  HCT 41.9   < > 37.4 36.0 34.8* 37.4 38.0  MCV 94.4   < > 89.3 86.7 86.1 86.6 86.0  PLT 198   < > 187 170 158 148* PENDING   < > = values in this interval not displayed.    Basic Metabolic Panel: Recent Labs  Lab 11/08/18 1703  11/09/18 0432  11/11/18 0431 11/12/18 0345 11/13/18 0336 11/14/18 0312 11/15/18 0405  NA 142   < > 143   < > 140 140 141 140 142  K 4.8   < > 4.3   < > 4.4 3.9 4.0 3.8 4.1  CL 101  --  103   < > 98 100 97* 98 101  CO2 25  --  29   < > 35* 28 29 29 28   GLUCOSE 147*  --  173*   < > 133* 127* 137* 116* 86  BUN 15  --  27*   < > 20 20 20 18 20   CREATININE 0.63  --  0.62   < > 0.52 0.35* 0.49 0.43* 0.44  CALCIUM 9.0  --  9.4   < > 9.2 9.2 9.0 8.9 8.9  MG 2.1  --  2.3  --  2.0  --   --   --  2.2  PHOS 2.8  --  4.0  --  4.4  --   --   --  3.5   < > = values in this interval not displayed.   GFR: Estimated Creatinine Clearance: 65.2 mL/min (by C-G formula based on SCr of 0.44 mg/dL). Recent Labs  Lab 11/08/18 1703 11/08/18 2125  11/12/18 0345 11/13/18 0336 11/14/18 0553 11/15/18 0405  WBC 5.1  --    < > 8.7 8.9 16.0* PENDING  LATICACIDVEN 3.5* 2.9*  --   --   --   --   --    < > =  values in this interval not displayed.    Liver Function Tests: Recent Labs  Lab 11/08/18 1703  AST 22  ALT 14  ALKPHOS 65  BILITOT 0.8  PROT 6.2*  ALBUMIN 3.5   No results for input(s): LIPASE, AMYLASE in the last 168 hours. No results for input(s): AMMONIA in the last 168 hours.  ABG    Component Value  Date/Time   PHART 7.441 11/10/2018 0346   PCO2ART 54.9 (H) 11/10/2018 0346   PO2ART 107.0 11/10/2018 0346   HCO3 37.3 (H) 11/10/2018 0346   TCO2 39 (H) 11/10/2018 0346   ACIDBASEDEF 0.4 06/30/2014 1033   O2SAT 98.0 11/10/2018 0346     Coagulation Profile: No results for input(s): INR, PROTIME in the last 168 hours.  Cardiac Enzymes: Recent Labs  Lab 11/08/18 1703 11/08/18 2125 11/09/18 0432  TROPONINI <0.03 <0.03 <0.03    HbA1C: Hgb A1c MFr Bld  Date/Time Value Ref Range Status  02/26/2016 04:15 PM 6.2 (H) 4.8 - 5.6 % Final    Comment:    (NOTE)         Pre-diabetes: 5.7 - 6.4         Diabetes: >6.4         Glycemic control for adults with diabetes: <7.0     CBG: Recent Labs  Lab 11/14/18 1556 11/14/18 1954 11/14/18 2356 11/15/18 0356 11/15/18 0747  GLUCAP 135* 98 96 95 94   The patient is critically ill with multiple organ system failure and requires high complexity decision making for assessment and support, frequent evaluation and titration of therapies, advanced monitoring, review of radiographic studies and interpretation of complex data.   Critical Care Time devoted to patient care services, exclusive of separately billable procedures, described in this note is 35 minutes.   Marshell Garfinkel MD [facility] Pager [phone] If no answer call 336 [phone] 11/15/2018, 8:59 AM

## 2018-11-15 NOTE — Progress Notes (Signed)
RT NOTE:  Changed pt's hollister without incident.

## 2018-11-16 ENCOUNTER — Inpatient Hospital Stay (HOSPITAL_COMMUNITY): Payer: Medicaid Other

## 2018-11-16 LAB — GLUCOSE, CAPILLARY
Glucose-Capillary: 114 mg/dL — ABNORMAL HIGH (ref 70–99)
Glucose-Capillary: 118 mg/dL — ABNORMAL HIGH (ref 70–99)
Glucose-Capillary: 125 mg/dL — ABNORMAL HIGH (ref 70–99)
Glucose-Capillary: 128 mg/dL — ABNORMAL HIGH (ref 70–99)
Glucose-Capillary: 137 mg/dL — ABNORMAL HIGH (ref 70–99)
Glucose-Capillary: 148 mg/dL — ABNORMAL HIGH (ref 70–99)

## 2018-11-16 LAB — BASIC METABOLIC PANEL
Anion gap: 11 (ref 5–15)
BUN: 16 mg/dL (ref 6–20)
CO2: 28 mmol/L (ref 22–32)
Calcium: 8.8 mg/dL — ABNORMAL LOW (ref 8.9–10.3)
Chloride: 98 mmol/L (ref 98–111)
Creatinine, Ser: 0.33 mg/dL — ABNORMAL LOW (ref 0.44–1.00)
GFR calc Af Amer: >60 mL/min (ref >60)
GFR calc non Af Amer: >60 mL/min (ref >60)
Glucose, Bld: 105 mg/dL — ABNORMAL HIGH (ref 70–99)
POTASSIUM: 3.9 mmol/L (ref 3.5–5.1)
Sodium: 137 mmol/L (ref 135–145)

## 2018-11-16 LAB — TRIGLYCERIDES: Triglycerides: 52 mg/dL (ref <150)

## 2018-11-16 LAB — CBC
HCT: 35.2 % — ABNORMAL LOW (ref 36.0–46.0)
Hemoglobin: 11.1 g/dL — ABNORMAL LOW (ref 12.0–15.0)
MCH: 27.5 pg (ref 26.0–34.0)
MCHC: 31.5 g/dL (ref 30.0–36.0)
MCV: 87.3 fL (ref 80.0–100.0)
Platelets: 80 10*3/uL — ABNORMAL LOW (ref 150–400)
RBC: 4.03 MIL/uL (ref 3.87–5.11)
RDW: 15.7 % — ABNORMAL HIGH (ref 11.5–15.5)
WBC: 9.6 10*3/uL (ref 4.0–10.5)
nRBC: 0 % (ref 0.0–0.2)

## 2018-11-16 LAB — PHOSPHORUS: Phosphorus: 3.9 mg/dL (ref 2.5–4.6)

## 2018-11-16 LAB — MAGNESIUM: Magnesium: 2.1 mg/dL (ref 1.7–2.4)

## 2018-11-16 MED ORDER — SODIUM CHLORIDE 0.9 % IV SOLN: 250.0000 mL | INTRAVENOUS | Status: DC | PRN

## 2018-11-16 MED ORDER — SODIUM CHLORIDE 0.9% FLUSH: 3.0000 mL | INTRAVENOUS | Status: DC | PRN

## 2018-11-16 MED ORDER — METHADONE HCL 5 MG PO TABS
5.0000 mg | ORAL_TABLET | Freq: Three times a day (TID) | ORAL | Status: DC
  Administered 2018-11-16 – 2018-11-20 (×14): 5 mg via ORAL
  Filled 2018-11-16 (×14): qty 1

## 2018-11-16 MED ORDER — SODIUM CHLORIDE 0.9% FLUSH
3.0000 mL | Freq: Two times a day (BID) | INTRAVENOUS | Status: DC
  Administered 2018-11-16 – 2018-11-22 (×11): 3 mL via INTRAVENOUS

## 2018-11-16 NOTE — Progress Notes (Addendum)
NAME:  Courtney Terry, MRN:  [id number], DOB:  [birth year], LOS: 8 ADMISSION DATE:  11/08/2018, CONSULTATION DATE:  11/08/18 REFERRING MD:  Karmen Bongo, MD, CHIEF COMPLAINT:  Respiratory failure    Brief History   58 year old female with COPD (FEV1 25%), chronic hypoxemic respiratory failure, with recent hospitalization in December for COPD exacerbation who presents with SOB and AMS and found in acute on chronic hypoxemic and hypercarbic respiratory failure requiring intubation. Initially admitted by Hospitalist. PCCM consulted for admission to the intensive care unit after being intubated in the emergency room on 2/2  History of present illness   58 year old female with PMHx below who presents with worsening dyspnea and altered mental status. EMS was called and she was treated with bag mask ventilation, nebulizers, steroids and magnesium with improvement. Patient was subsequently found to be hypercarbic and hypoxemic with a depressed mental status in the emergency room. Patient required intubation in ED for respiratory failure. UDS positive for cocaine. Of note she has a past medical history of severe COPD FEV1 25% predicted (spirometry 10/2016). CTA completed in the emergency room negative for pulmonary embolism with advanced evidence of COPD.   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 RVP 2/2: negative Urine cx 2/2: >= 1000,000  diphtheroids  Antimicrobials:  Azithromycin 11/08/2018>> 2/6 CTX 11/09/18>> 2/7  Interim history/subjective:  Remains on ventilatory, agitated requiring high sedation   Objective   Blood pressure (!) 95/59, pulse 63, temperature 99.1 F (37.3 C), resp. rate 14, height 5\' 10"  (1.778 m), weight 55.6 kg, SpO2 97 %.    Vent Mode: PRVC FiO2 (%):  [30 %] 30 % Set Rate:  [14 bmp] 14 bmp Vt Set:  [540 mL] 540 mL PEEP:  [5 cmH20] 5 cmH20 Pressure Support:  [10 cmH20] 10 cmH20 Plateau Pressure:  [17 cmH20-23 cmH20] 21 cmH20   Intake/Output Summary (Last 24 hours) at 11/16/2018 9030 Last data filed at 11/16/2018 0600 Gross per 24 hour  Intake 2389.99 ml  Output 1370 ml  Net 1019.99 ml   Filed Weights   11/14/18 0500 11/15/18 0418 11/16/18 0401  Weight: 52.8 kg 52.6 kg 55.6 kg    Examination: General: sedated, ETT in place, mech vent dependent  HENT: no scleral icterus Lungs: CTAB, no wheezes, rales, or rhonchi  Cardiovascular: RRR, no MRG  Abdomen: firm, non distended, bowel sounds present  Extremities: no edema, 2+ DP pulses  Neuro: sedated  GU: foley in place  Resolved Hospital Problem list     Assessment & Plan:  Acute on chronic hypoxemic and hypercarbic respiratory failure requiring intubation and mechanical ventilation secondary to severe acute COPD Exacerbation in a patient with very severe COPD, prior FEV1 25% and severe COPD related cachexia Reduce sedation, attempt pressure support weans Consider adding scheduled benzo to help wean off of sedation gtt Continue Solu-Medrol 40 daily, DuoNeb Completed Ceftriaxone, azithromycin. Will add methadone for sedation, watch QTC given seroquel use. Hopeful that we can wean fentanyl  now that methadone is started   Low platelets Platelets decreased to 80, >50% drop from admission after 8 days of heparin  SQ heparin discontinued  Will obtain HIT antibody, have ordered HIT protocol  Will obtain LE and UE dopplers to r/o DVT, if  clot is present will need to contact pharmacy for direct thrombin inhibitor   Constipation No BM despite Senna, miralax, and colace. Abdomen firm but good bowel sounds Will add soap suds enema   Chronic hypertension Continue midodrine.  Possible seizure-like activity valproic acid level <10, possibly non compliant at home. Valproic acid appears to be for mood disorder rather than seizure activity. EEG negative for epileptiform activity  -PRN Ativan -Continue valproic acid  Urine drug screen positive for cocaine History of polysubstance abuse and opiate abuse in the past. Current tobacco abuse history -consider rehab after this admission -encourage smoking cessation   Schizophrenia PTSD Bipolar disease Restart home prozac 40mg  daily, valproic acid  Holding olazapine Added seroquel 2/7  Right breast cancer status post lumpectomy T1N0 Home meds: arimidex Holding chemotherapy at this time  Hypothyroidism  TSH wnl on admission. Home meds synthroid 75 mcg  Continue IV synthroid 37.5 mcg daily   Constipation No recorded BM since 2/6.  -continue docusate and senna -can consider further PRN stool softeners if no further BM  Nutrition Tube feedings Add thiamine and folic acid in the setting of substance use.  Best practice:  Diet: adult tube feed protocol  Pain/Anxiety/Delirium protocol (if indicated): fentanyl gtt, versed gtt, precedex gtt, methadone VAP protocol (if indicated): Yes DVT prophylaxis: SQ heparin discontinued, SCDs GI prophylaxis: famotidine  Glucose control: sSSI Mobility: BR  Code Status: Full  Family Communication: friend at bedside, all questions answered  Disposition: Continue in ICU, vent dependent   Labs   CBC: Recent Labs  Lab 11/11/18 0431 11/12/18 0345 11/13/18 0336 11/14/18 0553 11/15/18 0405 11/16/18 0225  WBC 7.8 8.7 8.9 16.0* 12.9* 9.6  NEUTROABS 6.8  --   --   --   --   --   HGB 11.5* 11.3* 10.7* 12.1 12.3 11.1*  HCT 37.4  36.0 34.8* 37.4 38.0 35.2*  MCV 89.3 86.7 86.1 86.6 86.0 87.3  PLT 187 170 158 148* PLATELET CLUMPS NOTED ON SMEAR, COUNT APPEARS DECREASED 80*    Basic Metabolic Panel: Recent Labs  Lab 11/11/18 0431 11/12/18 0345 11/13/18 0336 11/14/18 0312 11/15/18 0405 11/16/18 0225  NA 140 140 141 140 142 137  K 4.4 3.9 4.0 3.8 4.1 3.9  CL 98 100 97* 98 101 98  CO2 35* 28 29 29 28 28   GLUCOSE 133* 127* 137* 116* 86 105*  BUN 20 20 20 18 20 16   CREATININE 0.52 0.35* 0.49 0.43* 0.44 0.33*  CALCIUM 9.2 9.2 9.0 8.9 8.9 8.8*  MG 2.0  --   --   --  2.2 2.1  PHOS 4.4  --   --   --  3.5 3.9   GFR: Estimated Creatinine Clearance: 68.9 mL/min (A) (by C-G formula based on SCr of 0.33 mg/dL (L)). Recent Labs  Lab 11/13/18 0336 11/14/18 0553 11/15/18 0405 11/16/18 0225  WBC 8.9 16.0* 12.9* 9.6    Liver Function Tests: No results for input(s): AST, ALT, ALKPHOS, BILITOT, PROT, ALBUMIN in the last 168 hours. No results for input(s): LIPASE, AMYLASE in the last 168 hours. No results for input(s): AMMONIA in the last 168 hours.  ABG    Component Value Date/Time   PHART 7.441 11/10/2018 0346  PCO2ART 54.9 (H) 11/10/2018 0346   PO2ART 107.0 11/10/2018 0346   HCO3 37.3 (H) 11/10/2018 0346   TCO2 39 (H) 11/10/2018 0346   ACIDBASEDEF 0.4 06/30/2014 1033   O2SAT 98.0 11/10/2018 0346     Coagulation Profile: No results for input(s): INR, PROTIME in the last 168 hours.  Cardiac Enzymes: No results for input(s): CKTOTAL, CKMB, CKMBINDEX, TROPONINI in the last 168 hours.  HbA1C: Hgb A1c MFr Bld  Date/Time Value Ref Range Status  02/26/2016 04:15 PM 6.2 (H) 4.8 - 5.6 % Final    Comment:    (NOTE)         Pre-diabetes: 5.7 - 6.4         Diabetes: >6.4         Glycemic control for adults with diabetes: <7.0     CBG: Recent Labs  Lab 11/15/18 1600 11/15/18 2018 11/16/18 0003 11/16/18 0409 11/16/18 0755  GLUCAP 142* 108* 114* 118* 125*    Review of Systems:   Unable to obtain  as patient is sedated    Critical care time: 30 min

## 2018-11-17 ENCOUNTER — Inpatient Hospital Stay (HOSPITAL_COMMUNITY): Payer: Medicaid Other

## 2018-11-17 DIAGNOSIS — D696 Thrombocytopenia, unspecified: Secondary | ICD-10-CM

## 2018-11-17 LAB — GLUCOSE, CAPILLARY
GLUCOSE-CAPILLARY: 107 mg/dL — AB (ref 70–99)
GLUCOSE-CAPILLARY: 114 mg/dL — AB (ref 70–99)
GLUCOSE-CAPILLARY: 141 mg/dL — AB (ref 70–99)
Glucose-Capillary: 95 mg/dL (ref 70–99)
Glucose-Capillary: 118 mg/dL — ABNORMAL HIGH (ref 70–99)
Glucose-Capillary: 111 mg/dL — ABNORMAL HIGH (ref 70–99)

## 2018-11-17 LAB — CBC
HCT: 33.8 % — ABNORMAL LOW (ref 36.0–46.0)
HEMOGLOBIN: 10.3 g/dL — AB (ref 12.0–15.0)
MCH: 26.8 pg (ref 26.0–34.0)
MCHC: 30.5 g/dL (ref 30.0–36.0)
MCV: 87.8 fL (ref 80.0–100.0)
Platelets: 157 10*3/uL (ref 150–400)
RBC: 3.85 MIL/uL — ABNORMAL LOW (ref 3.87–5.11)
RDW: 15.3 % (ref 11.5–15.5)
WBC: 11.2 10*3/uL — ABNORMAL HIGH (ref 4.0–10.5)
nRBC: 0 % (ref 0.0–0.2)

## 2018-11-17 LAB — HEPARIN INDUCED PLATELET AB (HIT ANTIBODY): Heparin Induced Plt Ab: 0.181 OD (ref 0.000–0.400)

## 2018-11-17 LAB — BASIC METABOLIC PANEL
Anion gap: 9 (ref 5–15)
BUN: 12 mg/dL (ref 6–20)
CO2: 28 mmol/L (ref 22–32)
Calcium: 8.8 mg/dL — ABNORMAL LOW (ref 8.9–10.3)
Chloride: 103 mmol/L (ref 98–111)
Creatinine, Ser: <0.3 mg/dL — ABNORMAL LOW (ref 0.44–1.00)
Glucose, Bld: 128 mg/dL — ABNORMAL HIGH (ref 70–99)
Potassium: 4.1 mmol/L (ref 3.5–5.1)
Sodium: 140 mmol/L (ref 135–145)

## 2018-11-17 NOTE — Progress Notes (Signed)
Bilateral lower and upper extremities venous duplex exam completed.   Acute thrombosis seen at bilateral cephalic veins.   More details please see preliminary notes on CV PROC under chart review.   Courtney Terry H Donny Heffern(RDMS RVT) 11/17/18 2:48 PM

## 2018-11-17 NOTE — Progress Notes (Signed)
[facility] Progress Note Patient Name: Courtney Terry DOB: 11-Jun-1961 MRN: [id number]   Date of Service  11/17/2018  HPI/Events of Note  Agitation - Request to renew bilateral soft wrist restraint orders.  eICU Interventions  Will renew bilateral soft wrist restraint orders.     Intervention Category Major Interventions: Delirium, psychosis, severe agitation - evaluation and management  Meleane Selinger Eugene 11/17/2018, 12:17 AM

## 2018-11-17 NOTE — Progress Notes (Signed)
NAME:  Courtney Terry, MRN:  [id number], DOB:  [birth year], LOS: 9 ADMISSION DATE:  11/08/2018, CONSULTATION DATE:  11/08/18 REFERRING MD:  Karmen Bongo, MD, CHIEF COMPLAINT:  Respiratory failure    Brief History   58 year old female with COPD (FEV1 25%), chronic hypoxemic respiratory failure, with recent hospitalization in December for COPD exacerbation who presents with SOB and AMS and found in acute on chronic hypoxemic and hypercarbic respiratory failure requiring intubation. Initially admitted by Hospitalist. PCCM consulted for admission to the intensive care unit after being intubated in the emergency room on 2/2  History of present illness   58 year old female with PMHx below who presents with worsening dyspnea and altered mental status. EMS was called and she was treated with bag mask ventilation, nebulizers, steroids and magnesium with improvement. Patient was subsequently found to be hypercarbic and hypoxemic with a depressed mental status in the emergency room. Patient required intubation in ED for respiratory failure. UDS positive for cocaine. Of note she has a past medical history of severe COPD FEV1 25% predicted (spirometry 10/2016). CTA completed in the emergency room negative for pulmonary embolism with advanced evidence of COPD.   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 RVP 2/2: negative Urine cx 2/2: >= 1000,000  diphtheroids  Antimicrobials:  Azithromycin 11/08/2018>2/6 CTX 11/09/18>2/7  Interim history/subjective:  Sedated, unable to obtain history. Per RN difficult to wean. When patient is not sedated she is tachycardic to 140s. When sedated she is only taking minimal respirations.   Objective   Blood pressure 98/63, pulse (!) 154, temperature 98.6 F (37 C), resp. rate (!) 26, height 5\' 10"  (1.778 m), weight 54.9 kg, SpO2 96 %.    Vent Mode: PRVC FiO2 (%):  [30 %] 30 % Set Rate:  [14 bmp] 14 bmp Vt Set:  [540 mL] 540 mL PEEP:  [5 cmH20] 5 cmH20 Plateau Pressure:  [17 cmH20-27 cmH20] 20 cmH20   Intake/Output Summary (Last 24 hours) at 11/17/2018 0800 Last data filed at 11/17/2018 0500 Gross per 24 hour  Intake 3120.51 ml  Output 3775 ml  Net -654.49 ml   Filed Weights   11/15/18 0418 11/16/18 0401 11/17/18 0429  Weight: 52.6 kg 55.6 kg 54.9 kg    Examination: General: sedated, ETT in place, mech vent dependent  HENT: PERRL Lungs: CTAB, no wheezes, rales, or rhonchi  Cardiovascular: RRR, no MRG Abdomen: soft, non distended, bowel sounds present  Extremities: no edema, 2+ DP pulses  Neuro: sedated  GU: foley in place   Resolved Hospital Problem list     Assessment & Plan:  Acute on chronic hypoxemic and hypercarbic respiratory failure requiring intubation and mechanical ventilation secondary to severe acute COPD Exacerbation in a patient with very severe COPD, prior FEV1 25% and severe COPD related cachexia Reduce sedation, attempt pressure support weans. If continues to be unable to wean may need to consider trach  Consider adding scheduled benzo to help wean off of sedation gtt Continue  Solu-Medrol 40 daily, DuoNeb methadone for sedation, watch QTC given seroquel use. Hopeful that we can wean fentanyl now that methadone is started   Low platelets Platelets improved to 157 after discontinuation of heparin  Continue to hold SQ heparin  HIT antibody pending  Will obtain LE  and UE dopplers to r/o DVT, if clot is present will need to contact pharmacy for direct thrombin inhibitor   Constipation No BM despite Senna, miralax, and colace. Abdomen firm but good bowel sounds S/p enema  Chronic hypertension Continue midodrine.  Possible seizure-like activity valproic acid level <10, possibly non compliant at home. Valproic acid appears to be for mood disorder rather than seizure activity. EEG negative for epileptiform activity  -PRN Ativan -Continue valproic acid  Urine drug screen positive for cocaine History of polysubstance abuse and opiate abuse in the past. Current tobacco abuse history -consider rehab after this admission -encourage smoking cessation   Schizophrenia PTSD Bipolar disease Restart home prozac 40mg  daily, valproic acid  Holding olazapine Added seroquel 2/7  Right breast cancer status post lumpectomy T1N0 Home meds: arimidex Holding chemotherapy at this time  Hypothyroidism  TSH wnl on admission. Home meds synthroid 75 mcg  Continue IV synthroid 37.5 mcg daily   Constipation No recorded BM since 2/6.  -continue docusate and senna -can consider further PRN stool softeners if no further BM  Nutrition Tube feedings Add thiamine and folic acid in the setting of substance use.  Best practice:  Diet: adult tube feed protocol  Pain/Anxiety/Delirium protocol (if indicated): fentanyl gtt, versed gtt, precedex gtt, methadone VAP protocol (if indicated): Yes DVT prophylaxis: SQ heparin discontinued, SCDs GI prophylaxis: famotidine  Glucose control: sSSI Mobility: BR  Code Status: Full  Family Communication: no family at bedside  Disposition: Continue in ICU, vent dependent   Labs   CBC: Recent Labs  Lab 11/11/18 0431  11/13/18 0336 11/14/18 0553 11/15/18 0405 11/16/18 0225 11/17/18 0348  WBC 7.8   < > 8.9 16.0* 12.9* 9.6 11.2*  NEUTROABS 6.8  --   --   --   --   --   --   HGB 11.5*   < > 10.7* 12.1 12.3 11.1*  10.3*  HCT 37.4   < > 34.8* 37.4 38.0 35.2* 33.8*  MCV 89.3   < > 86.1 86.6 86.0 87.3 87.8  PLT 187   < > 158 148* PLATELET CLUMPS NOTED ON SMEAR, COUNT APPEARS DECREASED 80* 157   < > = values in this interval not displayed.    Basic Metabolic Panel: Recent Labs  Lab 11/11/18 0431  11/13/18 0336 11/14/18 0312 11/15/18 0405 11/16/18 0225 11/17/18 0348  NA 140   < > 141 140 142 137 140  K 4.4   < > 4.0 3.8 4.1 3.9 4.1  CL 98   < > 97* 98 101 98 103  CO2 35*   < > 29 29 28 28 28   GLUCOSE 133*   < > 137* 116* 86 105* 128*  BUN 20   < > 20 18 20 16 12   CREATININE 0.52   < > 0.49 0.43* 0.44 0.33* <0.30*  CALCIUM 9.2   < > 9.0 8.9 8.9 8.8* 8.8*  MG 2.0  --   --   --  2.2 2.1  --   PHOS 4.4  --   --   --  3.5 3.9  --    < > = values in this interval not displayed.   GFR: CrCl cannot  be calculated (This lab value cannot be used to calculate CrCl because it is not a number: <0.30). Recent Labs  Lab 11/14/18 0553 11/15/18 0405 11/16/18 0225 11/17/18 0348  WBC 16.0* 12.9* 9.6 11.2*    Liver Function Tests: No results for input(s): AST, ALT, ALKPHOS, BILITOT, PROT, ALBUMIN in the last 168 hours. No results for input(s): LIPASE, AMYLASE in the last 168 hours. No results for input(s): AMMONIA in the last 168 hours.  ABG    Component Value Date/Time   PHART 7.441 11/10/2018 0346   PCO2ART 54.9 (H) 11/10/2018 0346   PO2ART 107.0 11/10/2018 0346   HCO3 37.3 (H) 11/10/2018 0346   TCO2 39 (H) 11/10/2018 0346   ACIDBASEDEF 0.4 06/30/2014 1033   O2SAT 98.0 11/10/2018 0346     Coagulation Profile: No results for input(s): INR, PROTIME in the last 168 hours.  Cardiac Enzymes: No results for input(s): CKTOTAL, CKMB, CKMBINDEX, TROPONINI in the last 168 hours.  HbA1C: Hgb A1c MFr Bld  Date/Time Value Ref Range Status  02/26/2016 04:15 PM 6.2 (H) 4.8 - 5.6 % Final    Comment:    (NOTE)         Pre-diabetes: 5.7 - 6.4         Diabetes: >6.4         Glycemic control for  adults with diabetes: <7.0     CBG: Recent Labs  Lab 11/16/18 1141 11/16/18 1540 11/16/18 1946 11/16/18 2356 11/17/18 0431  GLUCAP 137* 148* 128* 107* 114*    Review of Systems:   Unable to obtain as patient is sedated   Critical care time: 30 min

## 2018-11-17 NOTE — Progress Notes (Signed)
Pt requiring soap suds enema followed by disimpaction of large amount of hard round stool at 2/10 1300. Pt w/o bowel movement following disimpaction until digital exam at 1600 and further firm stool and liquid stool.

## 2018-11-17 NOTE — Progress Notes (Signed)
Nutrition Follow-up  DOCUMENTATION CODES:   Severe malnutrition in context of chronic illness, Underweight  INTERVENTION:    Continue Vital AF 1.2 at 50 ml/h via OGT (1200 ml per day), provides 1440 kcal, 90 gm protein, 973 ml free water daily.   NUTRITION DIAGNOSIS:   Severe Malnutrition related to chronic illness(COPD) as evidenced by severe fat depletion, severe muscle depletion.  Ongoing  GOAL:   Patient will meet greater than or equal to 90% of their needs  Met with TF  MONITOR:   Vent status, TF tolerance, Labs, I & O's  ASSESSMENT:   58 yo female with PMH of asthma, Bipolar D/O, PTSD, COPD, anemia, schizophrenia, hypothyroidism, HTN, seizures, breast cancer, cocaine abuse who was admitted with COPD exacerbation, VDRF.  Patient remains intubated on ventilator support. Plans for tracheostomy soon if unable to wean from vent.  MV: 8 L/min Temp (24hrs), Avg:99.1 F (37.3 C), Min:98.1 F (36.7 C), Max:100 F (37.8 C)    Labs reviewed. CBG's: [phone] Medications reviewed and include colace, folic acid, novolog, solumedrol, thiamine.  Weight up 9.5 kg since admission  I/O +11.5 L since admission  Patient is tolerating TF well at goal rate to meet kcal and protein needs: Vital AF 1.2 at 50 ml/h (1440 kcal, 90 gm protein, 973 ml free water daily).  Diet Order:   Diet Order            Diet NPO time specified  Diet effective now              EDUCATION NEEDS:   No education needs have been identified at this time  Skin:  Skin Assessment: Reviewed RN Assessment  Last BM:  2/10 (type 1)  Height:   Ht Readings from Last 1 Encounters:  11/08/18 _0  (1.778 m)    Weight:   Wt Readings from Last 1 Encounters:  11/17/18 54.9 kg    Ideal Body Weight:  68.2 kg  BMI:  Body mass index is 17.37 kg/m.  Estimated Nutritional Needs:   Kcal:  1520  Protein:  75-90 gm  Fluid:  1.5 L    Molli Barrows, RD, LDN, [place] Pager [phone] After  Hours Pager [phone]

## 2018-11-18 ENCOUNTER — Inpatient Hospital Stay (HOSPITAL_COMMUNITY): Payer: Medicaid Other

## 2018-11-18 LAB — CBC
HCT: 31.6 % — ABNORMAL LOW (ref 36.0–46.0)
Hemoglobin: 9.8 g/dL — ABNORMAL LOW (ref 12.0–15.0)
MCH: 27.2 pg (ref 26.0–34.0)
MCHC: 31 g/dL (ref 30.0–36.0)
MCV: 87.8 fL (ref 80.0–100.0)
PLATELETS: 152 10*3/uL (ref 150–400)
RBC: 3.6 MIL/uL — ABNORMAL LOW (ref 3.87–5.11)
RDW: 15.4 % (ref 11.5–15.5)
WBC: 10.3 10*3/uL (ref 4.0–10.5)
nRBC: 0 % (ref 0.0–0.2)

## 2018-11-18 LAB — GLUCOSE, CAPILLARY
Glucose-Capillary: 108 mg/dL — ABNORMAL HIGH (ref 70–99)
Glucose-Capillary: 124 mg/dL — ABNORMAL HIGH (ref 70–99)
Glucose-Capillary: 152 mg/dL — ABNORMAL HIGH (ref 70–99)
Glucose-Capillary: 157 mg/dL — ABNORMAL HIGH (ref 70–99)
Glucose-Capillary: 89 mg/dL (ref 70–99)
Glucose-Capillary: 92 mg/dL (ref 70–99)
Glucose-Capillary: 98 mg/dL (ref 70–99)

## 2018-11-18 LAB — RAPID URINE DRUG SCREEN, HOSP PERFORMED
Amphetamines: NOT DETECTED
Barbiturates: NOT DETECTED
Benzodiazepines: POSITIVE — AB
Cocaine: NOT DETECTED
Opiates: NOT DETECTED
Tetrahydrocannabinol: NOT DETECTED

## 2018-11-18 LAB — BASIC METABOLIC PANEL
Anion gap: 5 (ref 5–15)
BUN: 14 mg/dL (ref 6–20)
CO2: 30 mmol/L (ref 22–32)
Calcium: 8.7 mg/dL — ABNORMAL LOW (ref 8.9–10.3)
Chloride: 104 mmol/L (ref 98–111)
Creatinine, Ser: 0.33 mg/dL — ABNORMAL LOW (ref 0.44–1.00)
GFR calc non Af Amer: >60 mL/min (ref >60)
Glucose, Bld: 121 mg/dL — ABNORMAL HIGH (ref 70–99)
Potassium: 4 mmol/L (ref 3.5–5.1)
Sodium: 139 mmol/L (ref 135–145)

## 2018-11-18 MED ORDER — MIDAZOLAM HCL 2 MG/2ML IJ SOLN: 5.0000 mg | Freq: Once | INTRAMUSCULAR | Status: DC

## 2018-11-18 MED ORDER — HYDROMORPHONE BOLUS VIA INFUSION
1.0000 mg | INTRAVENOUS | Status: DC | PRN
  Administered 2018-11-19 – 2018-11-21 (×6): 1 mg via INTRAVENOUS
  Filled 2018-11-18: qty 1

## 2018-11-18 MED ORDER — SODIUM CHLORIDE 0.9 % IV SOLN
0.0000 mg/h | INTRAVENOUS | Status: DC
  Administered 2018-11-18 (×2): 4 mg/h via INTRAVENOUS
  Filled 2018-11-18 (×3): qty 5

## 2018-11-18 MED ORDER — ETOMIDATE 2 MG/ML IV SOLN
40.0000 mg | Freq: Once | INTRAVENOUS | Status: AC
  Administered 2018-11-19: 20 mg via INTRAVENOUS

## 2018-11-18 MED ORDER — FENTANYL CITRATE (PF) 100 MCG/2ML IJ SOLN: 200.0000 ug | Freq: Once | INTRAMUSCULAR | Status: DC

## 2018-11-18 MED ORDER — VECURONIUM BROMIDE 10 MG IV SOLR: 10.0000 mg | Freq: Once | INTRAVENOUS | Status: DC

## 2018-11-18 MED ORDER — PROPOFOL 10 MG/ML IV BOLUS: 500.0000 mg | Freq: Once | INTRAVENOUS | Status: DC

## 2018-11-18 MED ORDER — PHENYLEPHRINE HCL-NACL 10-0.9 MG/250ML-% IV SOLN
0.0000 ug/min | INTRAVENOUS | Status: DC
  Filled 2018-11-18 (×2): qty 250

## 2018-11-18 NOTE — Progress Notes (Signed)
Upon assessment nurse found white powder substance under both of patients underarms and smearing across shoulders, provider made aware and came bedside, complete bath given with CHG wipes, nursing director made aware, family denies putting any powders or lotion on patient, safety camera implemented.

## 2018-11-18 NOTE — Progress Notes (Addendum)
NAME:  Courtney Terry, MRN:  [id number], DOB:  August 01, 1961, LOS: 19 ADMISSION DATE:  11/08/2018, CONSULTATION DATE:  11/08/18 REFERRING MD:  Courtney Bongo, MD, CHIEF COMPLAINT:  Respiratory failure    Brief History   58 year old female with COPD (FEV1 25%), chronic hypoxemic respiratory failure, with recent hospitalization in December for COPD exacerbation who presents with SOB and AMS and found in acute on chronic hypoxemic and hypercarbic respiratory failure requiring intubation. Initially admitted by Hospitalist. PCCM consulted for admission to the intensive care unit after being intubated in the emergency room on 2/2  History of present illness   58 year old female with PMHx below who presents with worsening dyspnea and altered mental status. EMS was called and she was treated with bag mask ventilation, nebulizers, steroids and magnesium with improvement. Patient was subsequently found to be hypercarbic and hypoxemic with a depressed mental status in the emergency room. Patient required intubation in ED for respiratory failure. UDS positive for cocaine. Of note she has a past medical history of severe COPD FEV1 25% predicted (spirometry 10/2016). CTA completed in the emergency room negative for pulmonary embolism with advanced evidence of COPD.   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 RVP 2/2: negative Urine cx 2/2: >= 1000,000  diphtheroids  Antimicrobials:  Azithromycin 11/08/2018>2/6 CTX 11/09/18>2/7  Interim history/subjective:  Unable to obtain history from patient. Per RN patient remains agitated despite full dose sedation. Is attempting to pull ETT and has moved it some.   Objective   Blood pressure (!) 84/54, pulse (!) 133, temperature 98.2 F (36.8 C), temperature source Bladder, resp. rate 18, height 5\' 10"  (1.778 m), weight 56.8 kg, SpO2 100 %.    Vent Mode: PRVC FiO2 (%):  [30 %] 30 % Set Rate:  [14 bmp] 14 bmp Vt Set:  [540 mL] 540 mL PEEP:  [5 cmH20] 5 cmH20 Plateau Pressure:  [13 cmH20-20 cmH20] 13 cmH20   Intake/Output Summary (Last 24 hours) at 11/18/2018 1761 Last data filed at 11/18/2018 6073 Gross per 24 hour  Intake 2629.55 ml  Output 2180 ml  Net 449.55 ml   Filed Weights   11/16/18 0401 11/17/18 0429 11/18/18 0453  Weight: 55.6 kg 54.9 kg 56.8 kg    Examination: General: sedated, ETT in place, mech vent dependent  HENT: no scleral icterus  Lungs: shallow breath sounds  Cardiovascular: RRR, no MRG  Abdomen: soft, non tender, non distended, bowel sounds normal  Extremities: no edema, 2+ DP pulses  Neuro: sedated GU: foley in place  Resolved Hospital Problem list     Assessment & Plan:  Acute on chronic hypoxemic and hypercarbic respiratory failure requiring intubation and mechanical ventilation secondary to severe acute COPD Exacerbation in a patient with very severe COPD, prior FEV1 25% and severe COPD related cachexia Reduce sedation, attempt pressure support weans. If continues to be unable to wean may need to consider trach. Per discussion on 2/11 with sisters [name] and [name] (over the phone)  all siblings in agreement to have trach placement if unable to wean  Consider adding scheduled benzo to help wean off of sedation gtt Continue Solu-Medrol 40 daily, DuoNeb methadone for sedation, watch QTC given seroquel use. AM EKG showing QTc 416.  Hopeful that we can wean fentanyl  now that methadone is started   Low platelets Platelets 152 Continue to hold SQ heparin  HIT antibody 0.181 UE doppler showing acute superficial vein thrombosis in left and right cephalic vein will need to contact pharmacy for direct thrombin inhibitor   Constipation Continue senna, colace, miralax   Chronic hypotension  Continue midodrine.  Possible seizure-like activity valproic acid level <10, possibly non compliant at home. Valproic acid appears to be for mood disorder rather than seizure activity. EEG negative for epileptiform activity  -PRN Ativan -Continue valproic acid  Urine drug screen positive for cocaine History of polysubstance abuse and opiate abuse in the past. Current tobacco abuse history -consider rehab after this admission -encourage smoking cessation   Schizophrenia PTSD Bipolar disease Restart home prozac 40mg  daily, valproic acid  Holding olazapine Added seroquel 2/7  Right breast cancer status post lumpectomy T1N0 Home meds: arimidex Holding chemotherapy at this time  Hypothyroidism  TSH wnl on admission. Home meds synthroid 75 mcg  Continue IV synthroid 37.5 mcg daily   Nutrition Tube feedings Add thiamine and folic acid in the setting of substance use.  Best practice:  Diet: adult tube feed protocol  Pain/Anxiety/Delirium protocol (if indicated): fentanyl gtt, versed gtt, precedex gtt, methadone VAP protocol (if indicated): Yes DVT prophylaxis: SQ heparin discontinued, SCDs GI prophylaxis: famotidine  Glucose control: sSSI Mobility: BR  Code Status: Full  Family Communication: no family at bedside  Disposition: Continue in ICU, vent dependent   Labs   CBC: Recent Labs  Lab 11/14/18 0553 11/15/18 0405 11/16/18 0225 11/17/18 0348 11/18/18 0551  WBC 16.0* 12.9* 9.6 11.2* 10.3  HGB 12.1 12.3 11.1* 10.3* 9.8*  HCT 37.4 38.0 35.2* 33.8* 31.6*  MCV 86.6 86.0 87.3 87.8 87.8  PLT 148* PLATELET CLUMPS NOTED ON SMEAR, COUNT  APPEARS DECREASED 80* 157 947    Basic Metabolic Panel: Recent Labs  Lab 11/14/18 0312 11/15/18 0405 11/16/18 0225 11/17/18 0348 11/18/18 0551  NA 140 142 137 140 139  K 3.8 4.1 3.9 4.1 4.0  CL 98 101 98 103 104  CO2 29 28 28 28 30   GLUCOSE 116* 86 105* 128* 121*  BUN 18 20 16 12 14   CREATININE 0.43* 0.44 0.33* <0.30* 0.33*  CALCIUM 8.9 8.9 8.8* 8.8* 8.7*  MG  --  2.2 2.1  --   --   PHOS  --  3.5 3.9  --   --    GFR: Estimated Creatinine Clearance: 70.4 mL/min (A) (by C-G formula based on SCr of 0.33 mg/dL (L)). Recent Labs  Lab 11/15/18 0405 11/16/18 0225 11/17/18 0348 11/18/18 0551  WBC 12.9* 9.6 11.2* 10.3    Liver Function Tests: No results for input(s): AST, ALT, ALKPHOS, BILITOT, PROT, ALBUMIN in the last 168 hours. No results for input(s): LIPASE, AMYLASE in the last 168 hours. No results for input(s): AMMONIA in the last 168 hours.  ABG    Component Value Date/Time   PHART 7.441 11/10/2018 0346   PCO2ART 54.9 (H) 11/10/2018 0346   PO2ART 107.0 11/10/2018 0346   HCO3 37.3 (H) 11/10/2018 0346   TCO2 39 (H) 11/10/2018 0346   ACIDBASEDEF 0.4 06/30/2014 1033   O2SAT 98.0 11/10/2018 0346  Coagulation Profile: No results for input(s): INR, PROTIME in the last 168 hours.  Cardiac Enzymes: No results for input(s): CKTOTAL, CKMB, CKMBINDEX, TROPONINI in the last 168 hours.  HbA1C: Hgb A1c MFr Bld  Date/Time Value Ref Range Status  02/26/2016 04:15 PM 6.2 (H) 4.8 - 5.6 % Final    Comment:    (NOTE)         Pre-diabetes: 5.7 - 6.4         Diabetes: >6.4         Glycemic control for adults with diabetes: <7.0     CBG: Recent Labs  Lab 11/17/18 1521 11/17/18 2011 11/18/18 0015 11/18/18 0432 11/18/18 0756  GLUCAP 141* 111* 92 152* 89     Ref. Range 11/16/2018 09:43  Heparin Induced Platelet Ab Latest Ref Range: 0.000 - 0.400 OD 0.181    Review of Systems:   Unable to obtain as patient is sedated   Critical care time: 30 min

## 2018-11-19 ENCOUNTER — Inpatient Hospital Stay: Payer: Self-pay

## 2018-11-19 ENCOUNTER — Inpatient Hospital Stay (HOSPITAL_COMMUNITY): Payer: Medicaid Other

## 2018-11-19 ENCOUNTER — Other Ambulatory Visit: Payer: Self-pay | Admitting: Internal Medicine

## 2018-11-19 DIAGNOSIS — Z930 Tracheostomy status: Secondary | ICD-10-CM

## 2018-11-19 LAB — BASIC METABOLIC PANEL
Anion gap: 7 (ref 5–15)
Anion gap: 6 (ref 5–15)
BUN: 15 mg/dL (ref 6–20)
BUN: 15 mg/dL (ref 6–20)
CO2: 28 mmol/L (ref 22–32)
CO2: 29 mmol/L (ref 22–32)
Calcium: 8.7 mg/dL — ABNORMAL LOW (ref 8.9–10.3)
Calcium: 8.8 mg/dL — ABNORMAL LOW (ref 8.9–10.3)
Chloride: 105 mmol/L (ref 98–111)
Chloride: 106 mmol/L (ref 98–111)
Creatinine, Ser: 0.31 mg/dL — ABNORMAL LOW (ref 0.44–1.00)
Creatinine, Ser: 0.33 mg/dL — ABNORMAL LOW (ref 0.44–1.00)
GFR calc Af Amer: >60 mL/min (ref >60)
GFR calc Af Amer: >60 mL/min (ref >60)
GFR calc non Af Amer: >60 mL/min (ref >60)
GFR calc non Af Amer: >60 mL/min (ref >60)
GLUCOSE: 88 mg/dL (ref 70–99)
Glucose, Bld: 85 mg/dL (ref 70–99)
Potassium: 5.6 mmol/L — ABNORMAL HIGH (ref 3.5–5.1)
Potassium: 4.2 mmol/L (ref 3.5–5.1)
Sodium: 140 mmol/L (ref 135–145)
Sodium: 141 mmol/L (ref 135–145)

## 2018-11-19 LAB — GLUCOSE, CAPILLARY
GLUCOSE-CAPILLARY: 75 mg/dL (ref 70–99)
Glucose-Capillary: 81 mg/dL (ref 70–99)
Glucose-Capillary: 85 mg/dL (ref 70–99)
Glucose-Capillary: 71 mg/dL (ref 70–99)
Glucose-Capillary: 94 mg/dL (ref 70–99)
Glucose-Capillary: 87 mg/dL (ref 70–99)

## 2018-11-19 LAB — CBC
HCT: 35.3 % — ABNORMAL LOW (ref 36.0–46.0)
Hemoglobin: 11.1 g/dL — ABNORMAL LOW (ref 12.0–15.0)
MCH: 28 pg (ref 26.0–34.0)
MCHC: 31.4 g/dL (ref 30.0–36.0)
MCV: 89.1 fL (ref 80.0–100.0)
Platelets: 152 10*3/uL (ref 150–400)
RBC: 3.96 MIL/uL (ref 3.87–5.11)
RDW: 15.6 % — AB (ref 11.5–15.5)
WBC: 10.1 10*3/uL (ref 4.0–10.5)
nRBC: 0 % (ref 0.0–0.2)

## 2018-11-19 LAB — CERVICOVAGINAL ANCILLARY ONLY
Bacterial vaginitis: NEGATIVE
Chlamydia: NEGATIVE
Neisseria Gonorrhea: NEGATIVE
Trichomonas: POSITIVE — AB

## 2018-11-19 LAB — PROTIME-INR
INR: 1.02
Prothrombin Time: 13.3 seconds (ref 11.4–15.2)

## 2018-11-19 LAB — APTT: aPTT: 25 seconds (ref 24–36)

## 2018-11-19 MED ORDER — DEXTROSE 50 % IV SOLN
1.0000 | Freq: Once | INTRAVENOUS | Status: AC
  Administered 2018-11-19: 50 mL via INTRAVENOUS
  Filled 2018-11-19: qty 50

## 2018-11-19 MED ORDER — SODIUM POLYSTYRENE SULFONATE 15 GM/60ML PO SUSP
15.0000 g | Freq: Once | ORAL | Status: AC
  Administered 2018-11-19: 15 g via ORAL
  Filled 2018-11-19: qty 60

## 2018-11-19 MED ORDER — MIDAZOLAM 50MG/50ML (1MG/ML) PREMIX INFUSION: 0.5000 mg/h | INTRAVENOUS | Status: DC

## 2018-11-19 MED ORDER — SODIUM CHLORIDE 0.9% FLUSH
10.0000 mL | Freq: Two times a day (BID) | INTRAVENOUS | Status: DC
  Administered 2018-11-19 – 2018-11-22 (×6): 10 mL
  Administered 2018-11-23: 20 mL
  Administered 2018-11-23 – 2018-11-27 (×8): 10 mL

## 2018-11-19 MED ORDER — SODIUM CHLORIDE 0.9 % IV SOLN
0.0000 mg/h | INTRAVENOUS | Status: AC
  Administered 2018-11-20: 4 mg/h via INTRAVENOUS
  Administered 2018-11-21: 0.5 mg/h via INTRAVENOUS
  Filled 2018-11-19 (×2): qty 5

## 2018-11-19 MED ORDER — NOREPINEPHRINE 4 MG/250ML-% IV SOLN: 0.0000 ug/min | INTRAVENOUS | Status: DC

## 2018-11-19 MED ORDER — SODIUM CHLORIDE 0.9% FLUSH: 10.0000 mL | INTRAVENOUS | Status: DC | PRN

## 2018-11-19 MED ORDER — CHLORHEXIDINE GLUCONATE CLOTH 2 % EX PADS
6.0000 | MEDICATED_PAD | Freq: Every day | CUTANEOUS | Status: DC
  Administered 2018-11-19 – 2018-11-27 (×9): 6 via TOPICAL

## 2018-11-19 MED ORDER — LACTATED RINGERS IV BOLUS
1000.0000 mL | Freq: Once | INTRAVENOUS | Status: AC
  Administered 2018-11-19: 1000 mL via INTRAVENOUS

## 2018-11-19 MED ORDER — MIDODRINE HCL 5 MG PO TABS
10.0000 mg | ORAL_TABLET | Freq: Three times a day (TID) | ORAL | Status: DC
  Administered 2018-11-19 – 2018-11-23 (×10): 10 mg via ORAL
  Filled 2018-11-19 (×13): qty 2

## 2018-11-19 MED ORDER — DEXMEDETOMIDINE HCL IN NACL 400 MCG/100ML IV SOLN
0.4000 ug/kg/h | INTRAVENOUS | Status: AC
  Administered 2018-11-19: 1.8 ug/kg/h via INTRAVENOUS
  Administered 2018-11-20: 1.2 ug/kg/h via INTRAVENOUS
  Administered 2018-11-20: 1.7 ug/kg/h via INTRAVENOUS
  Administered 2018-11-20 (×3): 1.8 ug/kg/h via INTRAVENOUS
  Administered 2018-11-21: 0.8 ug/kg/h via INTRAVENOUS
  Administered 2018-11-21: 1.2 ug/kg/h via INTRAVENOUS
  Administered 2018-11-21: 0.9 ug/kg/h via INTRAVENOUS
  Administered 2018-11-21: 1.5 ug/kg/h via INTRAVENOUS
  Administered 2018-11-22: 1 ug/kg/h via INTRAVENOUS
  Filled 2018-11-19 (×12): qty 100

## 2018-11-19 MED ORDER — MIDAZOLAM 50MG/50ML (1MG/ML) PREMIX INFUSION
1.0000 mg/h | INTRAVENOUS | Status: DC
  Administered 2018-11-19: 3 mg/h via INTRAVENOUS
  Administered 2018-11-20: 2 mg/h via INTRAVENOUS
  Administered 2018-11-22: 1 mg/h via INTRAVENOUS
  Filled 2018-11-19 (×3): qty 50

## 2018-11-19 MED ORDER — INSULIN ASPART 100 UNIT/ML IV SOLN
10.0000 [IU] | Freq: Once | INTRAVENOUS | Status: AC
  Administered 2018-11-19: 10 [IU] via INTRAVENOUS

## 2018-11-19 NOTE — Progress Notes (Addendum)
NAME:  Courtney Terry, MRN:  [id number], DOB:  04/10/1961, LOS: 53 ADMISSION DATE:  11/08/2018, CONSULTATION DATE:  11/08/18 REFERRING MD:  Karmen Bongo, MD, CHIEF COMPLAINT:  Respiratory failure    Brief History   58 year old female with COPD (FEV1 25%), chronic hypoxemic respiratory failure, with recent hospitalization in December for COPD exacerbation who presents with SOB and AMS and found in acute on chronic hypoxemic and hypercarbic respiratory failure requiring intubation. Initially admitted by Hospitalist. PCCM consulted for admission to the intensive care unit after being intubated in the emergency room on 2/2  History of present illness   58 year old female with PMHx below who presents with worsening dyspnea and altered mental status. EMS was called and she was treated with bag mask ventilation, nebulizers, steroids and magnesium with improvement. Patient was subsequently found to be hypercarbic and hypoxemic with a depressed mental status in the emergency room. Patient required intubation in ED for respiratory failure. UDS positive for cocaine. Of note she has a past medical history of severe COPD FEV1 25% predicted (spirometry 10/2016). CTA completed in the emergency room negative for pulmonary embolism with advanced evidence of COPD.   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 RVP 2/2: negative Urine cx 2/2: >= 100,000 diphtheroids Cervicovag swab  2/14 > pending  Antimicrobials:  Azithromycin 11/08/2018>2/6 CTX 11/09/18>2/7  Interim history/subjective:  Unable to obtain history as patient is sedated and vent dependent   Objective   Blood pressure (!) 88/62, pulse 62, temperature 98.4 F (36.9 C), resp. rate 14, height 5\' 10"  (1.778 m), weight 58.6 kg, SpO2 100 %.    Vent Mode: PRVC FiO2 (%):  [50 %-60 %] 50 % Set Rate:  [14 bmp] 14 bmp Vt Set:  [540 mL] 540 mL PEEP:  [5 cmH20] 5 cmH20 Plateau Pressure:  [15 cmH20-19 cmH20] 19 cmH20   Intake/Output Summary (Last 24 hours) at 11/19/2018 1025 Last data filed at 11/19/2018 0500 Gross per 24 hour  Intake 808.77 ml  Output 2600 ml  Net -1791.23 ml   Filed Weights   11/18/18 0453 11/18/18 1525 11/19/18 0500  Weight: 56.8 kg 58.1 kg 58.6 kg    Examination: General: sedated  HENT: PERRL Lungs: CTAB, no wheezes, rales or rhonchi  Cardiovascular: RRR, no MRG  Abdomen: soft, non distended, bowel sounds present  Extremities: no edema, 2+ DP pulses  Neuro: sedated  GU: foley in place   Resolved Hospital Problem list     Assessment & Plan:  Acute on chronic hypoxemic and hypercarbic respiratory failure requiring intubation and mechanical ventilation secondary to severe acute COPD Exacerbation in a patient with very severe COPD, prior FEV1 25% and severe COPD related cachexia Trach planned for 2/13 Continue Solu-Medrol 40 daily, DuoNeb methadone for sedation, watch QTC given seroquel use. AM EKG showing QTc 417.  Hopeful that we can wean fentanyl now that methadone is started   Hyperkalemia K elevated to 5.6 overnight Given insulin, D50, and kayexalate ON  Awaiting AM K results   Low platelets Platelets pending  Continue to hold SQ heparin  HIT antibody 0.181 UE doppler showing acute superficial vein thrombosis in left and right cephalic vein, no need for anticoagulation given that thrombosis is superficial   Constipation Continue senna, colace, miralax   Chronic  hypotension  Continue midodrine.  Possible seizure-like activity valproic acid level <10, possibly non compliant at home. Valproic acid appears to be for mood disorder rather than seizure activity. EEG negative for epileptiform activity  -PRN Ativan -Continue valproic acid  Urine drug screen positive for cocaine History of polysubstance abuse and opiate abuse in the past. Current tobacco abuse history -consider rehab after this admission -encourage smoking cessation   Schizophrenia PTSD Bipolar disease Restart home prozac 40mg  daily, valproic acid  Holding olazapine Added seroquel 2/7  Right breast cancer status post lumpectomy T1N0 Home meds: arimidex Holding chemotherapy at this time  Hypothyroidism  TSH wnl on admission. Home meds synthroid 75 mcg  Continue IV synthroid 37.5 mcg daily   Nutrition Tube feedings Add thiamine and folic acid in the setting of substance use.  Vaginal discharge Patient with thick purulent discharge  Cervicovaginal ancillary pending   Best practice:  Diet: adult tube feed protocol  Pain/Anxiety/Delirium protocol (if indicated): fentanyl gtt, versed gtt, precedex gtt, methadone VAP protocol (if indicated): Yes DVT prophylaxis: SQ heparin discontinued, SCDs GI prophylaxis: famotidine  Glucose control: sSSI Mobility: BR  Code Status: Full  Family Communication: friend at bedside but asleep so unable to see if he had questions  Disposition: Continue in ICU, vent dependent   Labs   CBC: Recent Labs  Lab 11/14/18 0553 11/15/18 0405 11/16/18 0225 11/17/18 0348 11/18/18 0551  WBC 16.0* 12.9* 9.6 11.2* 10.3  HGB 12.1 12.3 11.1* 10.3* 9.8*  HCT 37.4 38.0 35.2* 33.8* 31.6*  MCV 86.6 86.0 87.3 87.8 87.8  PLT 148* PLATELET CLUMPS NOTED ON SMEAR, COUNT APPEARS DECREASED 80* 157 762    Basic Metabolic Panel: Recent Labs  Lab 11/15/18 0405 11/16/18 0225 11/17/18 0348 11/18/18 0551 11/19/18 0258  NA 142 137 140 139 140  K  4.1 3.9 4.1 4.0 5.6*  CL 101 98 103 104 105  CO2 28 28 28 30 28   GLUCOSE 86 105* 128* 121* 88  BUN 20 16 12 14 15   CREATININE 0.44 0.33* <0.30* 0.33* 0.31*  CALCIUM 8.9 8.8* 8.8* 8.7* 8.7*  MG 2.2 2.1  --   --   --   PHOS 3.5 3.9  --   --   --    GFR: Estimated Creatinine Clearance: 72.6 mL/min (A) (by C-G formula based on SCr of 0.31 mg/dL (L)). Recent Labs  Lab 11/15/18 0405 11/16/18 0225 11/17/18 0348 11/18/18 0551  WBC 12.9* 9.6 11.2* 10.3    Liver Function Tests: No results for input(s): AST, ALT, ALKPHOS, BILITOT, PROT, ALBUMIN in the last 168 hours. No results for input(s): LIPASE, AMYLASE in the last 168 hours. No results for input(s): AMMONIA in the last 168 hours.  ABG    Component Value Date/Time   PHART 7.441 11/10/2018 0346   PCO2ART 54.9 (H) 11/10/2018 0346   PO2ART 107.0 11/10/2018 0346   HCO3 37.3 (H) 11/10/2018 0346   TCO2 39 (H) 11/10/2018 0346   ACIDBASEDEF 0.4 06/30/2014 1033   O2SAT 98.0 11/10/2018 0346     Coagulation Profile: No results for input(s): INR, PROTIME in the last 168 hours.  Cardiac Enzymes: No results for input(s): CKTOTAL, CKMB, CKMBINDEX, TROPONINI in the  last 168 hours.  HbA1C: Hgb A1c MFr Bld  Date/Time Value Ref Range Status  02/26/2016 04:15 PM 6.2 (H) 4.8 - 5.6 % Final    Comment:    (NOTE)         Pre-diabetes: 5.7 - 6.4         Diabetes: >6.4         Glycemic control for adults with diabetes: <7.0     CBG: Recent Labs  Lab 11/18/18 1619 11/18/18 2031 11/18/18 2345 11/19/18 0406 11/19/18 0802  GLUCAP 157* 124* 108* 81 85     Ref. Range 11/16/2018 09:43  Heparin Induced Platelet Ab Latest Ref Range: 0.000 - 0.400 OD 0.181    Review of Systems:   Unable to obtain   Critical care time: 30 min

## 2018-11-19 NOTE — Procedures (Signed)
Bedside Tracheostomy Insertion Procedure Note   Patient Details:   Name: Courtney Terry DOB: 1961/02/28 MRN: [id number]  Procedure: Tracheostomy  Pre Procedure Assessment: ET Tube Size:7.0 ET Tube secured at lip (cm):26 Bite block in place: Yes Breath Sounds: Rhonch  Post Procedure Assessment: BP (!) 76/51   Pulse (!) 59   Temp 97.9 F (36.6 C)   Resp 14   Ht 5\' 10"  (1.778 m)   Wt 58.6 kg   SpO2 100%   BMI 18.54 kg/m  O2 sats: stable throughout Complications: No apparent complications Patient did tolerate procedure well Tracheostomy Brand:Shiley Tracheostomy Style:Cuffed Tracheostomy Size: 6.0 Tracheostomy Secured HWE:[id number] and velcro  Tracheostomy Placement Confirmation:Trach cuff visualized and in place and chest x-ray  Vent settings changed  to100% Fio2 and rate increased 22  I time changed to .70.  Mickeal Needy, RRT informed of these changes.  [place] 11/19/2018, 1430 pm

## 2018-11-19 NOTE — Progress Notes (Signed)
[facility] Progress Note Patient Name: DONELLE HISE DOB: Nov 04, 1960 MRN: [id number]   Date of Service  11/19/2018  HPI/Events of Note  Extubated earlier. Trach. On precedex 1.8. HR 90. 122/76 as per bed side RN.   eICU Interventions  Ok to modify precedex order 1.2 to 1.8, already getting it. Watch for bradycardia.      Intervention Category Minor Interventions: Agitation / anxiety - evaluation and management;Communication with other healthcare providers and/or family  Elmer Sow 11/19/2018, 8:37 PM

## 2018-11-19 NOTE — Procedures (Signed)
Bronchoscopy Procedure Note Courtney Terry [id number] [birth year]  Procedure: Bronchoscopy Indications: tracheosotmy placement   Procedure Details Consent: Risks of procedure as well as the alternatives and risks of each were explained to the (patient/caregiver).  Consent for procedure obtained. Time Out: Verified patient identification, verified procedure, site/side was marked, verified correct patient position, special equipment/implants available, medications/allergies/relevent history reviewed, required imaging and test results available.  Performed  In preparation for procedure, patient was given 100% FiO2.  Procedure done by P Gabbriella Presswood ACNP-BC, under direct supervision of Dr Nelda Marseille. At first bronch was introduce through ET tube and structures of tracheal rings, carina identified for operator of tracheostomy who was Dr Nelda Marseille . Light of bronch passed through trachea and skin for indentification of tracheal rings for tracheostomy puncture. After this, under bronchoscopy guidance,  ET tube was pulled back sufficiently and very carefully. The ET tube was  pulled back enough to give room for tracheostomy operator and yet at same time to to ensure a secured airway. After this was accomplished, bronchoscope was withdrawn into the ET tube. After this,  Dr Nelda Marseille then performed tracheostomy under video visual provided by flexible video bronchoscopy. Followng introduction of tracheostomy,  the bronchoscope was removed from ET tube and introduced through tracheostomy. Correct position of tracheostomy was ensured, with enough room between carina and distal tracheostomy and no evidence of bleeding. The bronchoscope was then withdrawn. Respiratory therapist was then instructed to remove the ET tube.  Dr Nelda Marseille then proceeded to complete the tracheostomy with stay sutures   No complications   Erick Colace ACNP-BC [facility] Pager # [phone] OR # [phone] if no answer   Clementeen Graham 11/19/2018

## 2018-11-19 NOTE — Progress Notes (Signed)
Peripherally Inserted Central Catheter/Midline Placement  The IV Nurse has discussed with the patient and/or persons authorized to consent for the patient, the purpose of this procedure and the potential benefits and risks involved with this procedure.  The benefits include less needle sticks, lab draws from the catheter, and the patient may be discharged home with the catheter. Risks include, but not limited to, infection, bleeding, blood clot (thrombus formation), and puncture of an artery; nerve damage and irregular heartbeat and possibility to perform a PICC exchange if needed/ordered by physician.  Alternatives to this procedure were also discussed.  Bard Power PICC patient education guide, fact sheet on infection prevention and patient information card has been provided to patient /or left at bedside.    PICC/Midline Placement Documentation  PICC Triple Lumen 11/19/18 PICC Right Brachial 40 cm 0 cm (Active)  Indication for Insertion or Continuance of Line Prolonged intravenous therapies 11/19/2018  3:47 PM  Exposed Catheter (cm) 0 cm 11/19/2018  3:47 PM  Site Assessment Clean;Intact;Dry 11/19/2018  3:47 PM  Lumen #1 Status Flushed;Blood return noted;Saline locked 11/19/2018  3:47 PM  Lumen #2 Status Flushed;Blood return noted;Saline locked 11/19/2018  3:47 PM  Lumen #3 Status Flushed;Blood return noted;Saline locked 11/19/2018  3:47 PM  Dressing Type Transparent 11/19/2018  3:47 PM  Dressing Status Clean;Intact;Dry 11/19/2018  3:47 PM  Dressing Change Due 11/26/18 11/19/2018  3:47 PM       Scotty Court 11/19/2018, 3:49 PM

## 2018-11-19 NOTE — Progress Notes (Signed)
Bowel regimen held this morning for possible extubation or trach px. Gave other meds that would not have possibly been irritating to bowel before planned px.

## 2018-11-19 NOTE — Progress Notes (Signed)
[facility] Progress Note Patient Name: Courtney Terry DOB: 02-13-1961 MRN: [id number]   Date of Service  11/19/2018  HPI/Events of Note  Potassium 5.6  eICU Interventions  Ordered medical management with insulin, dextrose and Kayexalate.  Will check potassium at 10 AM     Intervention Category Intermediate Interventions: Electrolyte abnormality - evaluation and management;Medication change / dose adjustment;Communication with other healthcare providers and/or family  Mady Gemma 11/19/2018, 6:52 AM

## 2018-11-19 NOTE — Progress Notes (Signed)
RN called d/t pt ready to extubate now.  Came to room to eval/ wean pt for extubation. Once pt placed on wean, pt immediately became tachypnic w/ RR 50's, tachycardic 160's, pt became very anxious trying to sit up, pt w/ low VT.  Dr Chase Caller paged and per MD, hold off- do not extubate.  MD came to eval pt and talk to family.  RN at bedside t/o.

## 2018-11-19 NOTE — Procedures (Signed)
Percutaneous Tracheostomy Placement  Consent from family.  Patient sedated, paralyzed and position.  Placed on 100% FiO2 and RR matched.  Area cleaned and draped.  Lidocaine/epi injected.  Skin incision done followed by blunt dissection.  Trachea palpated then punctured, catheter passed and visualized bronchoscopically.  Wire placed and visualized.  Catheter removed.  Airway then entered and dilated.  Size 6 cuffed shiley trach placed and visualized bronchoscopically well above carina.  Good volume returns.  Patient tolerated the procedure well without complications.  Minimal blood loss.  CXR ordered and pending.  Wesam G. Yacoub, M.D. [facility]. Pager: [phone]. After hours pager: [phone].  

## 2018-11-19 NOTE — Care Management Note (Signed)
Case Management Note  Patient Details  Name: Courtney Terry MRN: [id number] Date of Birth: 06/05/1961  Subjective/Objective:     Pt admitted with resp failure            Action/Plan:   PTA from home on 2 liters home oxygen.     Expected Discharge Date:                  Expected Discharge Plan:     In-House Referral:  Clinical Social Work  Discharge planning Services  CM Consult  Post Acute Care Choice:    Choice offered to:     DME Arranged:    DME Agency:     HH Arranged:    HH Agency:     Status of Service:     If discussed at [employer], dates discussed:    Additional Comments: 11/19/2018  Pt is now s/p Trach.  If pt requires facility placement at discharge -  LTACH is not an option based on lack of insurance coverage.    11/12/18 Pt remains on ventilator Maryclare Labrador, RN 11/19/2018, 3:17 PM

## 2018-11-20 ENCOUNTER — Inpatient Hospital Stay (HOSPITAL_COMMUNITY): Payer: Medicaid Other

## 2018-11-20 LAB — CBC
HCT: 32.8 % — ABNORMAL LOW (ref 36.0–46.0)
Hemoglobin: 10.2 g/dL — ABNORMAL LOW (ref 12.0–15.0)
MCH: 27.9 pg (ref 26.0–34.0)
MCHC: 31.1 g/dL (ref 30.0–36.0)
MCV: 89.6 fL (ref 80.0–100.0)
NRBC: 0 % (ref 0.0–0.2)
Platelets: 196 10*3/uL (ref 150–400)
RBC: 3.66 MIL/uL — ABNORMAL LOW (ref 3.87–5.11)
RDW: 15.4 % (ref 11.5–15.5)
WBC: 13.6 10*3/uL — ABNORMAL HIGH (ref 4.0–10.5)

## 2018-11-20 LAB — GLUCOSE, CAPILLARY
GLUCOSE-CAPILLARY: 121 mg/dL — AB (ref 70–99)
GLUCOSE-CAPILLARY: 131 mg/dL — AB (ref 70–99)
Glucose-Capillary: 110 mg/dL — ABNORMAL HIGH (ref 70–99)
Glucose-Capillary: 113 mg/dL — ABNORMAL HIGH (ref 70–99)
Glucose-Capillary: 64 mg/dL — ABNORMAL LOW (ref 70–99)
Glucose-Capillary: 96 mg/dL (ref 70–99)

## 2018-11-20 LAB — BASIC METABOLIC PANEL
ANION GAP: 5 (ref 5–15)
BUN: 13 mg/dL (ref 6–20)
CO2: 29 mmol/L (ref 22–32)
Calcium: 8.5 mg/dL — ABNORMAL LOW (ref 8.9–10.3)
Chloride: 106 mmol/L (ref 98–111)
Creatinine, Ser: 0.37 mg/dL — ABNORMAL LOW (ref 0.44–1.00)
GFR calc non Af Amer: >60 mL/min (ref >60)
Glucose, Bld: 110 mg/dL — ABNORMAL HIGH (ref 70–99)
Potassium: 3.7 mmol/L (ref 3.5–5.1)
Sodium: 140 mmol/L (ref 135–145)

## 2018-11-20 LAB — HEPATIC FUNCTION PANEL
ALT: 16 U/L (ref 0–44)
AST: 16 U/L (ref 15–41)
Albumin: 2.5 g/dL — ABNORMAL LOW (ref 3.5–5.0)
Alkaline Phosphatase: 40 U/L (ref 38–126)
Total Bilirubin: 0.6 mg/dL (ref 0.3–1.2)
Total Protein: 5.2 g/dL — ABNORMAL LOW (ref 6.5–8.1)

## 2018-11-20 LAB — MAGNESIUM: Magnesium: 1.9 mg/dL (ref 1.7–2.4)

## 2018-11-20 LAB — PHOSPHORUS: PHOSPHORUS: 4.1 mg/dL (ref 2.5–4.6)

## 2018-11-20 MED ORDER — METRONIDAZOLE 50 MG/ML ORAL SUSPENSION
500.0000 mg | Freq: Two times a day (BID) | ORAL | Status: AC
  Administered 2018-11-20 – 2018-11-22 (×6): 500 mg
  Filled 2018-11-20 (×10): qty 10

## 2018-11-20 MED ORDER — VITAL AF 1.2 CAL PO LIQD
1000.0000 mL | ORAL | Status: DC
  Administered 2018-11-20 – 2018-11-22 (×4): 1000 mL
  Filled 2018-11-20: qty 1000

## 2018-11-20 MED ORDER — HEPARIN SODIUM (PORCINE) 5000 UNIT/ML IJ SOLN
5000.0000 [IU] | Freq: Three times a day (TID) | INTRAMUSCULAR | Status: DC
  Administered 2018-11-20 – 2018-11-27 (×23): 5000 [IU] via SUBCUTANEOUS
  Filled 2018-11-20 (×23): qty 1

## 2018-11-20 MED ORDER — MAGNESIUM SULFATE 2 GM/50ML IV SOLN
2.0000 g | Freq: Once | INTRAVENOUS | Status: AC
  Administered 2018-11-20: 2 g via INTRAVENOUS
  Filled 2018-11-20: qty 50

## 2018-11-20 NOTE — Evaluation (Signed)
Physical Therapy Evaluation Patient Details Name: Courtney Terry MRN: [id number] DOB: [birth year] Today's Date: 11/20/2018   History of Present Illness  Pt is a 58 year old woman admitted with acute on chronic respiratory failure on 2/2. Intubated 2/2-2/13, trach on 2/13 and remains on ventilator. Hospital course complicated by agitation. PMH: COPD on 2L home 02, bipolar, substance abuse.    Clinical Impression  Pt admitted with above diagnosis. Pt currently with functional limitations due to the deficits listed below (see PT Problem List). PTA pt independent, today pt weak, on vent with HR ranging 120-155 during visit. RN aware. Max A of 2 to stand and pivot to chair, will benefit greatly from post acute care.  Pt will benefit from skilled PT to increase their independence and safety with mobility to allow discharge to the venue listed below.    HR 120-155  BP 147/96 SpO2 WNL on Vent on PRVC FiO2 40% PEEP 5     Follow Up Recommendations CIR    Equipment Recommendations       Recommendations for Other Services Rehab consult     Precautions / Restrictions Precautions Precautions: Fall Precaution Comments: ventilator, watch HR Restrictions Weight Bearing Restrictions: No      Mobility  Bed Mobility Overal bed mobility: Needs Assistance Bed Mobility: Supine to Sit     Supine to sit: Max assist;HOB elevated     General bed mobility comments: assist for LEs over EOB and to raise trunk  Transfers Overall transfer level: Needs assistance   Transfers: Sit to/from Stand;Squat Pivot Transfers Sit to Stand: +2 physical assistance;Max assist   Squat pivot transfers: +2 physical assistance;Max assist     General transfer comment: assist to rise and steady, pt unable to stand fully upright  Ambulation/Gait                Stairs            Wheelchair Mobility    Modified Rankin (Stroke Patients Only)       Balance Overall balance assessment: Needs  assistance   Sitting balance-Leahy Scale: Poor Sitting balance - Comments: min assist at EOB   Standing balance support: Bilateral upper extremity supported Standing balance-Leahy Scale: Zero                               Pertinent Vitals/Pain Pain Assessment: Faces Faces Pain Scale: No hurt    Home Living Family/patient expects to be discharged to:: Private residence Living Arrangements: Other (Comment) Available Help at Discharge: Personal care attendant Type of Home: Apartment Home Access: Stairs to enter Entrance Stairs-Rails: None Entrance Stairs-Number of Steps: 4 Home Layout: One level Home Equipment: None Additional Comments: 02 dependent (2L)    Prior Function Level of Independence: Independent         Comments: home information taken from previous admission     Hand Dominance   Dominant Hand: Right    Extremity/Trunk Assessment   Upper Extremity Assessment Upper Extremity Assessment: Generalized weakness    Lower Extremity Assessment Lower Extremity Assessment: Defer to PT evaluation    Cervical / Trunk Assessment Cervical / Trunk Assessment: Normal  Communication   Communication: Expressive difficulties;Tracheostomy  Cognition Arousal/Alertness: Awake/alert Behavior During Therapy: Agitated Overall Cognitive Status: Impaired/Different from baseline Area of Impairment: Attention;Following commands;Safety/judgement;Awareness;Problem solving                   Current Attention Level: Focused  Following Commands: Follows one step commands inconsistently Safety/Judgement: Decreased awareness of safety;Decreased awareness of deficits Awareness: Intellectual Problem Solving: Difficulty sequencing;Decreased initiation;Requires verbal cues;Requires tactile cues        General Comments      Exercises     Assessment/Plan    PT Assessment Patient needs continued PT services  PT Problem List Decreased strength       PT  Treatment Interventions DME instruction;Gait training;Stair training;Functional mobility training;Therapeutic activities;Therapeutic exercise;Balance training;Cognitive remediation    PT Goals (Current goals can be found in the Care Plan section)  Acute Rehab PT Goals Patient Stated Goal: unable to state PT Goal Formulation: With patient Potential to Achieve Goals: Good    Frequency Min 3X/week   Barriers to discharge        Co-evaluation PT/OT/SLP Co-Evaluation/Treatment: Yes Reason for Co-Treatment: Complexity of the patient's impairments (multi-system involvement);Necessary to address cognition/behavior during functional activity PT goals addressed during session: Mobility/safety with mobility;Balance;Proper use of DME;Strengthening/ROM OT goals addressed during session: ADL's and self-care;Strengthening/ROM       AM-PAC PT "6 Clicks" Mobility  Outcome Measure Help needed turning from your back to your side while in a flat bed without using bedrails?: A Lot Help needed moving from lying on your back to sitting on the side of a flat bed without using bedrails?: A Lot Help needed moving to and from a bed to a chair (including a wheelchair)?: A Lot Help needed standing up from a chair using your arms (e.g., wheelchair or bedside chair)?: Total Help needed to walk in hospital room?: Total Help needed climbing 3-5 steps with a railing? : Total 6 Click Score: 9    End of Session Equipment Utilized During Treatment: Gait belt Activity Tolerance: Patient tolerated treatment well Patient left: in chair;with chair alarm set;with nursing/sitter in room;with family/visitor present;with call bell/phone within reach Nurse Communication: Mobility status PT Visit Diagnosis: Unsteadiness on feet (R26.81)    Time: 1210-1240 PT Time Calculation (min) (ACUTE ONLY): 30 min   Charges:   PT Evaluation $PT Eval Moderate Complexity: 1 Mod         Reinaldo Berber, PT, DPT Acute  Rehabilitation Services Pager: [phone] Office: [phone]    Reinaldo Berber 11/20/2018, 3:31 PM

## 2018-11-20 NOTE — Progress Notes (Addendum)
NAME:  Courtney Terry, MRN:  [id number], DOB:  03/16/1961, LOS: 12 ADMISSION DATE:  11/08/2018, CONSULTATION DATE:  11/08/18 REFERRING MD:  Karmen Bongo, MD, CHIEF COMPLAINT:  Respiratory failure    Brief History   58 year old female with COPD (FEV1 25%), chronic hypoxemic respiratory failure, with recent hospitalization in December for COPD exacerbation who presents with SOB and AMS and found in acute on chronic hypoxemic and hypercarbic respiratory failure requiring intubation. Initially admitted by Hospitalist. PCCM consulted for admission to the intensive care unit after being intubated in the emergency room on 2/2  History of present illness   58 year old female with PMHx below who presents with worsening dyspnea and altered mental status. EMS was called and she was treated with bag mask ventilation, nebulizers, steroids and magnesium with improvement. Patient was subsequently found to be hypercarbic and hypoxemic with a depressed mental status in the emergency room. Patient required intubation in ED for respiratory failure. UDS positive for cocaine. Of note she has a past medical history of severe COPD FEV1 25% predicted (spirometry 10/2016). CTA completed in the emergency room negative for pulmonary embolism with advanced evidence of COPD.   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 RVP 2/2: negative Urine cx 2/2: >= 100,000  diphtheroids Cervicovag swab 2/14 > Trichomonas   Antimicrobials:  Azithromycin 11/08/2018>2/6 CTX 11/09/18>2/7  Interim history/subjective:  Sedated, unable to obtain history.   Objective   Blood pressure 100/81, pulse (!) 57, temperature (!) 96.6 F (35.9 C), resp. rate 14, height 5\' 10"  (1.778 m), weight 58.5 kg, SpO2 100 %.    Vent Mode: PRVC FiO2 (%):  [40 %-50 %] 40 % Set Rate:  [14 bmp] 14 bmp Vt Set:  [540 mL] 540 mL PEEP:  [5 cmH20] 5 cmH20 Plateau Pressure:  [15 cmH20-22 cmH20] 15 cmH20   Intake/Output Summary (Last 24 hours) at 11/20/2018 2119 Last data filed at 11/20/2018 0600 Gross per 24 hour  Intake 3175.66 ml  Output 2730 ml  Net 445.66 ml   Filed Weights   11/18/18 1525 11/19/18 0500 11/20/18 0441  Weight: 58.1 kg 58.6 kg 58.5 kg    Examination: General: sedated, trach in place  HENT: no scleral icterus  Lungs: CTAB, no wheezes, rales, or rhonchi   Cardiovascular: RRR, no MRG  Abdomen: soft, non tender, non distended, bowel sounds present  Extremities: no edema, 2+ DP pulses   Neuro: sedated  GU: foley in place   Resolved Hospital Problem list   Hyperkalemia   Assessment & Plan:  Acute on chronic hypoxemic and hypercarbic respiratory failure requiring intubation and mechanical ventilation secondary to severe acute COPD Exacerbation in a patient with very severe COPD, prior FEV1 25% and severe COPD related cachexia Trach 2/13 Continue Solu-Medrol 40 daily, DuoNeb methadone for sedation, watch QTC given seroquel use. AM EKG showing QTc 438.  Hopeful that we can wean fentanyl now that methadone is started   Low platelets  Platelets 196, HIT antibody 0.181 restart SQ heparin  UE doppler showing acute superficial vein thrombosis in left and right cephalic vein, no need for anticoagulation given that thrombosis is superficial   Constipation Continue senna, colace, miralax   Chronic hypotension  Continue midodrine.  Possible seizure-like  activity valproic acid level <10, possibly non compliant at home. Valproic acid appears to be for mood disorder rather than seizure activity. EEG negative for epileptiform activity  -PRN Ativan -Continue valproic acid  Urine drug screen positive for cocaine History of polysubstance abuse and opiate abuse in the past. Current tobacco abuse history -consider rehab after this admission -encourage smoking cessation   Schizophrenia PTSD Bipolar disease Restart home prozac 40mg  daily, valproic acid  Holding olazapine Added seroquel 2/7  Right breast cancer status post lumpectomy T1N0 Home meds: arimidex Holding chemotherapy at this time  Hypothyroidism  TSH wnl on admission. Home meds synthroid 75 mcg  Continue IV synthroid 37.5 mcg daily   Nutrition Tube feedings Add thiamine and folic acid in the setting of substance use.  Vaginal discharge Patient with thick purulent discharge  Cervicovaginal ancillary showing trichomonas Will plan to treat with metronidazole 500mg  bid x7d (2g dose would be too much volume since patient is NPO), partners will need treatment as well   Best practice:  Diet: adult tube feed protocol  Pain/Anxiety/Delirium protocol (if indicated): fentanyl gtt, versed gtt, precedex gtt, methadone VAP protocol (if indicated): Yes DVT prophylaxis: SQ heparin, SCDs GI prophylaxis: famotidine  Glucose control: sSSI Mobility: BR  Code Status: Full  Family Communication: friend at bedside but asleep, unable to answer questions  Disposition: Continue in ICU   Labs   CBC: Recent Labs  Lab 11/16/18 0225 11/17/18 0348 11/18/18 0551 11/19/18 1052 11/20/18 0444  WBC 9.6 11.2* 10.3 10.1 13.6*  HGB 11.1* 10.3* 9.8* 11.1* 10.2*  HCT 35.2* 33.8* 31.6* 35.3* 32.8*  MCV 87.3 87.8 87.8 89.1 89.6  PLT 80* 157 152 152 937    Basic Metabolic Panel: Recent Labs  Lab 11/15/18 0405 11/16/18 0225 11/17/18 0348 11/18/18 0551 11/19/18 0258 11/19/18 1052  11/20/18 0444  NA 142 137 140 139 140 141 140  K 4.1 3.9 4.1 4.0 5.6* 4.2 3.7  CL 101 98 103 104 105 106 106  CO2 28 28 28 30 28 29 29   GLUCOSE 86 105* 128* 121* 88 85 110*  BUN 20 16 12 14 15 15 13   CREATININE 0.44 0.33* <0.30* 0.33* 0.31* 0.33* 0.37*  CALCIUM 8.9 8.8* 8.8* 8.7* 8.7* 8.8* 8.5*  MG 2.2 2.1  --   --   --   --  1.9  PHOS 3.5 3.9  --   --   --   --  4.1   GFR: Estimated Creatinine Clearance: 72.5 mL/min (A) (by C-G formula based on SCr of 0.37 mg/dL (L)). Recent Labs  Lab 11/17/18 0348 11/18/18 0551 11/19/18 1052 11/20/18 0444  WBC 11.2* 10.3 10.1 13.6*    Liver Function Tests: Recent Labs  Lab 11/20/18 0444  AST 16  ALT 16  ALKPHOS 40  BILITOT 0.6  PROT 5.2*  ALBUMIN 2.5*   No results for input(s): LIPASE, AMYLASE in the last 168 hours. No results for input(s): AMMONIA in the last 168 hours.  ABG    Component Value Date/Time   PHART 7.441 11/10/2018 0346   PCO2ART 54.9 (H) 11/10/2018 0346   PO2ART 107.0 11/10/2018 0346   HCO3 37.3 (H) 11/10/2018 0346   TCO2 39 (H) 11/10/2018 0346  ACIDBASEDEF 0.4 06/30/2014 1033   O2SAT 98.0 11/10/2018 0346     Coagulation Profile: Recent Labs  Lab 11/19/18 2301  INR 1.02    Cardiac Enzymes: No results for input(s): CKTOTAL, CKMB, CKMBINDEX, TROPONINI in the last 168 hours.  HbA1C: Hgb A1c MFr Bld  Date/Time Value Ref Range Status  02/26/2016 04:15 PM 6.2 (H) 4.8 - 5.6 % Final    Comment:    (NOTE)         Pre-diabetes: 5.7 - 6.4         Diabetes: >6.4         Glycemic control for adults with diabetes: <7.0     CBG: Recent Labs  Lab 11/19/18 1736 11/19/18 2000 11/19/18 2335 11/20/18 0429 11/20/18 0713  GLUCAP 94 87 75 110* 113*     Ref. Range 11/16/2018 09:43  Heparin Induced Platelet Ab Latest Ref Range: 0.000 - 0.400 OD 0.181    Review of Systems:   Unable to obtain as patient is sedated and mech vent dependent   Critical care time: 30 min

## 2018-11-20 NOTE — Procedures (Signed)
Cortrak  Person Inserting Tube:  Rosezetta Schlatter, RD Tube Type:  Cortrak - 43 inches Tube Location:  Left nare Initial Placement:  Stomach Secured by: Bridle Technique Used to Measure Tube Placement:  Documented cm marking at nare/ corner of mouth Cortrak Secured At:  67 cm    Cortrak Tube Team Note:  Consult received to place a Cortrak feeding tube.   X-ray is required, abdominal x-ray has been ordered by the Cortrak team. Please confirm tube placement before using the Cortrak tube.   If the tube becomes dislodged please keep the tube and contact the Cortrak team at [email or web address] (password [email or web address]) for replacement.  If after hours and replacement cannot be delayed, place a NG tube and confirm placement with an abdominal x-ray.     Jarome Matin, MS, RD, LDN, [facility] Inpatient Clinical Dietitian Pager # [phone] After hours/weekend pager # [phone]

## 2018-11-20 NOTE — Evaluation (Addendum)
Occupational Therapy Evaluation Patient Details Name: Courtney Terry MRN: [id number] DOB: [birth year] Today's Date: 11/20/2018    History of Present Illness Pt is a 58 year old woman admitted with acute on chronic respiratory failure on 2/2. Intubated 2/2-2/13, trach on 2/13 and remains on ventilator. Hospital course complicated by agitation. PMH: COPD on 2L home 02, bipolar, substance abuse.   Clinical Impression   Pt was independent prior to admission in mobility and ADL per prior admission entry. Presents with decreased activity tolerance, impaired cognition, generalized weakness and inability to fully stand. She requires 2 person assist for OOB and max to total assist for ADL. Pt with HR in 150s with agitation. Will follow acutely. Recommending SNF for continued rehab upon discharge.    Follow Up Recommendations  CIR;Supervision/Assistance - 24 hour    Equipment Recommendations       Recommendations for Other Services       Precautions / Restrictions Precautions Precautions: Fall Precaution Comments: ventilator, watch HR      Mobility Bed Mobility Overal bed mobility: Needs Assistance Bed Mobility: Supine to Sit     Supine to sit: Max assist;HOB elevated     General bed mobility comments: assist for LEs over EOB and to raise trunk  Transfers Overall transfer level: Needs assistance   Transfers: Sit to/from Stand;Squat Pivot Transfers Sit to Stand: +2 physical assistance;Max assist   Squat pivot transfers: +2 physical assistance;Max assist     General transfer comment: assist to rise and steady, pt unable to stand fully upright    Balance Overall balance assessment: Needs assistance   Sitting balance-Leahy Scale: Poor Sitting balance - Comments: min assist at EOB   Standing balance support: Bilateral upper extremity supported Standing balance-Leahy Scale: Zero                             ADL either performed or assessed with clinical  judgement   ADL                                         General ADL Comments: Pt is NPO, max to total assist for ADL.     Vision   Additional Comments: vision appears grossly intact     Perception     Praxis      Pertinent Vitals/Pain Pain Assessment: Faces Faces Pain Scale: No hurt     Hand Dominance Right   Extremity/Trunk Assessment Upper Extremity Assessment Upper Extremity Assessment: Generalized weakness   Lower Extremity Assessment Lower Extremity Assessment: Defer to PT evaluation   Cervical / Trunk Assessment Cervical / Trunk Assessment: Normal   Communication Communication Communication: Expressive difficulties;Tracheostomy(due to )   Cognition Arousal/Alertness: Awake/alert Behavior During Therapy: Agitated Overall Cognitive Status: Impaired/Different from baseline Area of Impairment: Attention;Following commands;Safety/judgement;Awareness;Problem solving                   Current Attention Level: Focused   Following Commands: Follows one step commands inconsistently Safety/Judgement: Decreased awareness of safety;Decreased awareness of deficits Awareness: Intellectual Problem Solving: Difficulty sequencing;Decreased initiation;Requires verbal cues;Requires tactile cues     General Comments       Exercises     Shoulder Instructions      Home Living Family/patient expects to be discharged to:: Private residence Living Arrangements: Other (Comment)(roommate)   Type of Home: Apartment Home Access: Stairs to  enter Entrance Stairs-Number of Steps: 4 Entrance Stairs-Rails: None Home Layout: One level     Bathroom Shower/Tub: Teacher, early years/pre: Standard     Home Equipment: None   Additional Comments: 02 dependent (2L)      Prior Functioning/Environment Level of Independence: Independent        Comments: home information taken from previous admission        OT Problem List: Decreased  strength;Decreased activity tolerance;Impaired balance (sitting and/or standing);Decreased coordination;Decreased cognition;Decreased safety awareness;Decreased knowledge of use of DME or AE;Cardiopulmonary status limiting activity      OT Treatment/Interventions: Self-care/ADL training;DME and/or AE instruction;Therapeutic activities;Cognitive remediation/compensation;Patient/family education;Balance training    OT Goals(Current goals can be found in the care plan section) Acute Rehab OT Goals Patient Stated Goal: unable to state OT Goal Formulation: Patient unable to participate in goal setting Time For Goal Achievement: 12/04/18 Potential to Achieve Goals: Good ADL Goals Pt Will Perform Eating: with supervision;with set-up;sitting(when cleared for PO) Pt Will Perform Grooming: with set-up;with supervision;sitting Pt Will Perform Upper Body Dressing: with min assist;sitting Pt Will Perform Lower Body Dressing: with mod assist;sit to/from stand Pt Will Transfer to Toilet: with mod assist;stand pivot transfer;bedside commode Pt Will Perform Toileting - Clothing Manipulation and hygiene: with mod assist;sit to/from stand Additional ADL Goal #1: Pt will perform bed mobility with min assist in preparation for ADL Additional ADL Goal #2: Pt will follow one step commands with 50% accuracy.  OT Frequency: Min 2X/week   Barriers to D/C:            Co-evaluation PT/OT/SLP Co-Evaluation/Treatment: Yes Reason for Co-Treatment: Complexity of the patient's impairments (multi-system involvement);Necessary to address cognition/behavior during functional activity;For patient/therapist safety   OT goals addressed during session: ADL's and self-care;Strengthening/ROM      AM-PAC OT "6 Clicks" Daily Activity     Outcome Measure Help from another person eating meals?: Total Help from another person taking care of personal grooming?: A Lot Help from another person toileting, which includes using  toliet, bedpan, or urinal?: Total Help from another person bathing (including washing, rinsing, drying)?: Total Help from another person to put on and taking off regular upper body clothing?: A Lot Help from another person to put on and taking off regular lower body clothing?: Total 6 Click Score: 8   End of Session Equipment Utilized During Treatment: Gait belt Nurse Communication: Mobility status  Activity Tolerance: Treatment limited secondary to agitation Patient left: in chair;with call bell/phone within reach;with chair alarm set;with nursing/sitter in room;with restraints reapplied  OT Visit Diagnosis: Unsteadiness on feet (R26.81);Other symptoms and signs involving cognitive function;Muscle weakness (generalized) (M62.81)                Time: 6237-6283 OT Time Calculation (min): 26 min Charges:  OT General Charges $OT Visit: 1 Visit OT Evaluation $OT Eval High Complexity: 1 High  Nestor Lewandowsky, OTR/L Acute Rehabilitation Services Pager: [phone] Office: [phone]  Courtney Terry 11/20/2018, 1:31 PM

## 2018-11-20 NOTE — Progress Notes (Signed)
Rehab Admissions Coordinator Note:  Per PT and OT recommendation, this patient was screened by Jhonnie Garner for appropriateness for an Inpatient Acute Rehab Consult.  At this time, the pt is still on the vent. AC will follow for medical improvement and progression with therapies to determine appropriateness of IP rehab consult.   Jhonnie Garner 11/20/2018, 3:58 PM  I can be reached at [phone].

## 2018-11-20 NOTE — Progress Notes (Signed)
SLP Cancellation Note  Patient Details Name: Courtney Terry MRN: [id number] DOB: 1961-05-11   Cancelled treatment:       Reason Eval/Treat Not Completed: Other (comment) Patient with new tracheostomy. Orders for SLP eval and treat for PMSV and swallowing received. Per chart review, pt is sedated on the vent at this time. Will follow pt closely for readiness for SLP interventions as appropriate.     Venita Sheffield Crissy Mccreadie 11/20/2018, 8:59 AM  Germain Osgood Jayce Kainz, M.A. [place] Acute Environmental education officer [phone] Office [phone]

## 2018-11-20 NOTE — Progress Notes (Signed)
Nutrition Follow-up  DOCUMENTATION CODES:   Severe malnutrition in context of chronic illness, Underweight  INTERVENTION:   Resume TF via Cortrak:  Vital AF 1.2 at 60 ml/h (1440 ml per day)  Provides 1728 kcal, 108 gm protein, 1168 ml free water daily  NUTRITION DIAGNOSIS:   Severe Malnutrition related to chronic illness(COPD) as evidenced by severe fat depletion, severe muscle depletion.  Ongoing  GOAL:   Patient will meet greater than or equal to 90% of their needs  Met with TF  MONITOR:   TF tolerance, Labs, Skin  ASSESSMENT:   58 yo female with PMH of asthma, Bipolar D/O, PTSD, COPD, anemia, schizophrenia, hypothyroidism, HTN, seizures, breast cancer, cocaine abuse who was admitted with COPD exacerbation, VDRF.  S/P trach 2/13. Remains on vent support. Cortrak was placed today. Tip in stomach. MV: 13 L/min Temp (24hrs), Avg:98.9 F (37.2 C), Min:96.6 F (35.9 C), Max:99.9 F (37.7 C)  Patient has been receiving Vital AF 1.2 at 50 ml/h. Provides 1440 kcal, 90 gm protein, 973 ml free water daily. Tolerating well. Not quite meeting re-estimated nutrition needs.  Labs reviewed. CBG's: [id number] Medications reviewed and include colace, folic acid, novolog, solumedrol, miralax, senokot, thiamine, magnesium sulfate, neosynephrine.    Diet Order:   Diet Order            Diet NPO time specified  Diet effective now              EDUCATION NEEDS:   No education needs have been identified at this time  Skin:  Skin Assessment: Skin Integrity Issues: Skin Integrity Issues:: Unstageable Unstageable: sacrum  Last BM:  2/13 (type 6)  Height:   Ht Readings from Last 1 Encounters:  11/08/18 5' 10"  (1.778 m)    Weight:   Wt Readings from Last 1 Encounters:  11/20/18 58.5 kg    Ideal Body Weight:  68.2 kg  BMI:  Body mass index is 18.51 kg/m.  Estimated Nutritional Needs:   Kcal:  1695  Protein:  85-100 gm  Fluid:  >/= 1.6 L    Molli Barrows, RD, LDN, [place] Pager [phone] After Hours Pager [phone]

## 2018-11-21 ENCOUNTER — Inpatient Hospital Stay (HOSPITAL_COMMUNITY): Payer: Medicaid Other

## 2018-11-21 LAB — CBC
HEMATOCRIT: 36.1 % (ref 36.0–46.0)
Hemoglobin: 10.9 g/dL — ABNORMAL LOW (ref 12.0–15.0)
MCH: 26.8 pg (ref 26.0–34.0)
MCHC: 30.2 g/dL (ref 30.0–36.0)
MCV: 88.7 fL (ref 80.0–100.0)
Platelets: 232 10*3/uL (ref 150–400)
RBC: 4.07 MIL/uL (ref 3.87–5.11)
RDW: 15.2 % (ref 11.5–15.5)
WBC: 14.9 10*3/uL — ABNORMAL HIGH (ref 4.0–10.5)
nRBC: 0 % (ref 0.0–0.2)

## 2018-11-21 LAB — BASIC METABOLIC PANEL
Anion gap: 8 (ref 5–15)
BUN: 17 mg/dL (ref 6–20)
CHLORIDE: 102 mmol/L (ref 98–111)
CO2: 28 mmol/L (ref 22–32)
Calcium: 8.8 mg/dL — ABNORMAL LOW (ref 8.9–10.3)
Creatinine, Ser: 0.41 mg/dL — ABNORMAL LOW (ref 0.44–1.00)
GFR calc Af Amer: >60 mL/min (ref >60)
GFR calc non Af Amer: >60 mL/min (ref >60)
Glucose, Bld: 172 mg/dL — ABNORMAL HIGH (ref 70–99)
Potassium: 3.7 mmol/L (ref 3.5–5.1)
Sodium: 138 mmol/L (ref 135–145)

## 2018-11-21 LAB — GLUCOSE, CAPILLARY
Glucose-Capillary: 113 mg/dL — ABNORMAL HIGH (ref 70–99)
Glucose-Capillary: 61 mg/dL — ABNORMAL LOW (ref 70–99)
Glucose-Capillary: 107 mg/dL — ABNORMAL HIGH (ref 70–99)
Glucose-Capillary: 110 mg/dL — ABNORMAL HIGH (ref 70–99)
Glucose-Capillary: 129 mg/dL — ABNORMAL HIGH (ref 70–99)
Glucose-Capillary: 96 mg/dL (ref 70–99)

## 2018-11-21 LAB — MAGNESIUM: Magnesium: 2.2 mg/dL (ref 1.7–2.4)

## 2018-11-21 LAB — PHOSPHORUS: Phosphorus: 4.7 mg/dL — ABNORMAL HIGH (ref 2.5–4.6)

## 2018-11-21 LAB — HIV ANTIBODY (ROUTINE TESTING W REFLEX): HIV Screen 4th Generation wRfx: NONREACTIVE

## 2018-11-21 MED ORDER — NICOTINE 21 MG/24HR TD PT24
21.0000 mg | MEDICATED_PATCH | Freq: Every day | TRANSDERMAL | Status: DC
  Administered 2018-11-21 – 2018-11-22 (×2): 21 mg via TRANSDERMAL
  Filled 2018-11-21 (×2): qty 1

## 2018-11-21 MED ORDER — DEXTROSE 50 % IV SOLN
INTRAVENOUS | Status: AC
  Administered 2018-11-21: 25 mL
  Filled 2018-11-21: qty 50

## 2018-11-21 MED ORDER — METHADONE HCL 5 MG PO TABS
10.0000 mg | ORAL_TABLET | Freq: Three times a day (TID) | ORAL | Status: DC
  Administered 2018-11-21 – 2018-11-22 (×6): 10 mg via ORAL
  Filled 2018-11-21 (×7): qty 2

## 2018-11-21 NOTE — Progress Notes (Signed)
Patient's BP 70's-80's while sleeping/sedated. MAP >65. When patient is awake and agitated BP 120'2-140's. Neo not started at this time. Sedation decreased over night.   Margaret Pyle, RN

## 2018-11-21 NOTE — Progress Notes (Signed)
5913 - Pt placed on ATC trial at this time, tolerating well. RT will continue to monitor

## 2018-11-21 NOTE — Progress Notes (Signed)
Wasted 10 mg of hydromorphone with Terrace Arabia, RN at [phone]

## 2018-11-21 NOTE — Progress Notes (Signed)
NAME:  Courtney Terry, MRN:  [id number], DOB:  16-Sep-1961, LOS: 37 ADMISSION DATE:  11/08/2018, CONSULTATION DATE:  11/08/18 REFERRING MD:  Karmen Bongo, MD, CHIEF COMPLAINT:  Respiratory failure    Brief History   58 year old female with COPD (FEV1 25%), chronic hypoxemic respiratory failure, with recent hospitalization in December for COPD exacerbation who presents with SOB and AMS and found in acute on chronic hypoxemic and hypercarbic respiratory failure requiring intubation. Initially admitted by Hospitalist. PCCM consulted for admission to the intensive care unit after being intubated in the emergency room on 2/2  History of present illness   58 year old female with PMHx below who presents with worsening dyspnea and altered mental status. EMS was called and she was treated with bag mask ventilation, nebulizers, steroids and magnesium with improvement. Patient was subsequently found to be hypercarbic and hypoxemic with a depressed mental status in the emergency room. Patient required intubation in ED for respiratory failure. UDS positive for cocaine. Of note she has a past medical history of severe COPD FEV1 25% predicted (spirometry 10/2016). CTA completed in the emergency room negative for pulmonary embolism with advanced evidence of COPD.   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 Still agitated and unable to wean off sedation. HITT negative. Heparin being restarted   Consults:  PCCM   Procedures:  ETT 2/2>2/13 Trach 2/13 >>  Significant Diagnostic Tests:  CTA 2/2: Neg for PE, emphysema, multiple pulmonary nodules, ETT in place CT head w/o contrast 2/2:  stable/normal EEG 2/3: moderate global encephalopathy,  No epileptiform activities  UDS 2/2 > cocaine  Micro Data:  BCx 2/2: no growth x5d Sputum cx 2/2: Normal respiratory flora. RVP 2/2: negative Urine cx 2/2: >= 100,000 diphtheroids Cervicovag swab 2/14 > Trichomonas   Antimicrobials:  Azithromycin 11/08/2018>2/6 CTX 11/09/18>2/7  Interim history/subjective:     11/21/2018  - some improvement in agitation. Able to be directional with mouthing questions (asking about $). Severe air trapping + . RT going to attempt ATC.   Objective   Blood pressure 100/76, pulse 76, temperature 98.8 F (37.1 C), resp. rate 14, height 5\' 10"  (1.778 m), weight 57.3 kg, SpO2 100 %.    Vent Mode: PRVC FiO2 (%):  [40 %] 40 % Set Rate:  [14 bmp] 14 bmp Vt Set:  [540 mL] 540 mL PEEP:  [5 cmH20] 5 cmH20 Plateau Pressure:  [18 cmH20] 18 cmH20   Intake/Output Summary (Last 24 hours) at 11/21/2018 0816 Last data filed at 11/21/2018 0759 Gross per 24 hour  Intake 2428.7 ml  Output 985 ml  Net 1443.7 ml   Filed Weights   11/20/18 0441 11/21/18 0500 11/21/18 0649  Weight: 58.5 kg 57.3 kg 57.3 kg   General Appearance:  Looks criticall ill, EMACIATED,  Head:  Normocephalic, without obvious abnormality, atraumatic Eyes:  PERRL - yes, conjunctiva/corneas - clear     Ears:  Normal external ear canals, both ears Nose:  G tube - YES Throat:  ETT TUBE - no , OG tube - no, TRACH + Neck:  Supple,  No enlargement/tenderness/nodules Lungs: Clear to auscultation bilaterally, Ventilator   Synchrony - not when agitated. Has air trapping. BARRELL CHEST Heart:  S1 and  S2 normal, no murmur, CVP - no.  Pressors - no Abdomen:  Soft, no masses, no organomegaly Genitalia / Rectal:  Not done Extremities:  Extremities- intact Skin:  ntact in exposed areas . Sacral area - not examined Neurologic:  Sedation - dilaudid gtt, precedex tt, versed gtt oral methadone,  -> RASS - +2 . Moves all 4s - yes. CAM-ICU - not tested .  Orientation - following some commands     LABS   ekg - qtc 467 msec  PULMONARY No results for input(s): PHART, PCO2ART, PO2ART, HCO3, TCO2, O2SAT in the last 168 hours.  Invalid input(s): PCO2, PO2  CBC Recent Labs  Lab 11/19/18 1052 11/20/18 0444 11/21/18 0348  HGB 11.1* 10.2* 10.9*  HCT 35.3* 32.8* 36.1  WBC 10.1 13.6* 14.9*  PLT 152 196 232    COAGULATION Recent Labs  Lab 11/19/18 2301  INR 1.02    CARDIAC  No results for input(s): TROPONINI in the last 168 hours. No results for input(s): PROBNP in the last 168 hours.   CHEMISTRY Recent Labs  Lab 11/15/18 0405 11/16/18 0225  11/18/18 0551 11/19/18 0258 11/19/18 1052 11/20/18 0444 11/21/18 0348  NA 142 137   < > 139 140 141 140 138  K 4.1 3.9   < > 4.0 5.6* 4.2 3.7 3.7  CL 101 98   < > 104 105 106 106 102  CO2 28 28   < > 30 28 29 29 28   GLUCOSE 86 105*   < > 121* 88 85 110* 172*  BUN 20 16   < > 14 15 15 13 17   CREATININE 0.44 0.33*   < > 0.33* 0.31* 0.33* 0.37* 0.41*  CALCIUM 8.9 8.8*   < > 8.7* 8.7* 8.8* 8.5* 8.8*  MG 2.2 2.1  --   --   --   --  1.9 2.2  PHOS 3.5 3.9  --   --   --   --  4.1 4.7*   < > = values in this interval not displayed.   Estimated Creatinine Clearance: 71 mL/min (A) (by C-G formula based on SCr of 0.41 mg/dL (L)).   LIVER Recent Labs  Lab 11/19/18 2301 11/20/18 0444  AST  --  16  ALT  --  16  ALKPHOS  --  40  BILITOT  --  0.6  PROT  --  5.2*  ALBUMIN  --  2.5*  INR 1.02  --      INFECTIOUS No results for input(s): LATICACIDVEN, PROCALCITON in the last 168 hours.   ENDOCRINE CBG (last 3)  Recent Labs    11/21/18 0355 11/21/18 0426 11/21/18 0814  GLUCAP 61* 113* 107*         IMAGING x48h  - image(s) personally visualized  -   highlighted in bold Dg Abd 1 View  Result Date: 11/19/2018 CLINICAL DATA:  NG tube EXAM: ABDOMEN - 1 VIEW COMPARISON:  03/06/2014 FINDINGS: NG tube tip is in the distal stomach. IMPRESSION: NG tube tip in the distal  stomach. Electronically Signed   By: Rolm Baptise M.D.   On: 11/19/2018 19:11   Dg Chest Port 1 View  Result Date: 11/21/2018 CLINICAL DATA:  Respiratory failure EXAM: PORTABLE CHEST 1 VIEW COMPARISON:  November 20, 2018 FINDINGS: Tracheostomy catheter tip is 8.8 cm above the carina. Feeding tube tip is in the stomach. Central catheter tip is at the cavoatrial junction. No pneumothorax. Lungs hyperexpanded. There is scarring in the right base. There is  no appreciable edema or consolidation. Heart size and pulmonary vascularity normal. No adenopathy. Surgical clips are present in the right axillary region. IMPRESSION: Tube and catheter positions as described without pneumothorax. Lungs hyperexpanded. Scarring right base. No edema or consolidation. Stable cardiac silhouette. Postoperative change right breast region. Electronically Signed   By: Lowella Grip III M.D.   On: 11/21/2018 07:58   Dg Chest Port 1 View  Result Date: 11/20/2018 CLINICAL DATA:  Respiratory failure EXAM: PORTABLE CHEST 1 VIEW COMPARISON:  November 19, 2018 FINDINGS: Tracheostomy catheter tip is 10.0 cm above the carina. Nasogastric tube tip and side port are in the distal stomach region. Central catheter tip is in the superior vena cava. No pneumothorax. Lungs remain hyperexpanded. There is scarring in the lateral right base. There is no edema or consolidation. Heart size and pulmonary vascularity are normal. No adenopathy. Postoperative changes are noted in the right axillary region. IMPRESSION: Tube and catheter positions as described without pneumothorax. Lungs remain hyperexpanded. Scarring right base. No edema or consolidation. Stable cardiac silhouette. Electronically Signed   By: Lowella Grip III M.D.   On: 11/20/2018 07:42   Dg Chest Port 1 View  Result Date: 11/19/2018 CLINICAL DATA:  Tracheostomy tube placement. EXAM: PORTABLE CHEST 1 VIEW COMPARISON:  11/19/2018 FINDINGS: The tip of the tracheostomy tube is 9 cm  above the carina. The NG tube is been removed. The cardiac silhouette, mediastinal and hilar contours are normal in stable. Stable emphysematous changes with marked hyperinflation. No acute pulmonary findings or pleural effusion. IMPRESSION: Tracheostomy tube in good position. No complicating features are demonstrated. Stable advanced emphysematous changes and marked hyperinflation. No acute overlying pulmonary process. Electronically Signed   By: Marijo Sanes M.D.   On: 11/19/2018 15:11   Dg Abd Portable 1v  Result Date: 11/20/2018 CLINICAL DATA:  Status post feeding tube placement. EXAM: PORTABLE ABDOMEN - 1 VIEW COMPARISON:  Single-view of the abdomen yesterday. FINDINGS: NG tube has been removed. Feeding tube is in place and looped in the stomach with the tip directed superiorly. IMPRESSION: As above. Electronically Signed   By: Inge Rise M.D.   On: 11/20/2018 13:19   [place] Ekg Site Rite  Result Date: 11/19/2018 If Site Rite image not attached, placement could not be confirmed due to current cardiac rhythm.    Resolved Hospital Problem list   Hyperkalemia   Assessment & Plan:  Acute on chronic hypoxemic and hypercarbic respiratory failure requiring intubation and mechanical ventilation secondary to severe acute COPD Exacerbation in a patient with very severe COPD, prior FEV1 25% and severe COPD related cachexia Trach 2/13   11/21/2018 - air trappin and copd making rapid wean difficult. Some ? Improvement  PLAN Continue Solu-Medrol 40 daily, ATC for  < 30 min 11/21/2018  Can try PSV Rest back on vent   Low platelets - UE doppler showing acute superficial vein thrombosis in left and right cephalic vein, no need for anticoagulation given that thrombosis is superficial . , HIT antibody 0.181  Plan - restart SQ heparin 11/20/2018   Constipation Continue senna, colace, miralax   Chronic hypotension  Continue midodrine.  Possible seizure-like activity valproic acid level <10,  possibly non compliant at home. Valproic acid appears to be for mood disorder rather than seizure activity. EEG negative for epileptiform activity  -PRN Ativan -Continue valproic acid   Schizophrenia PTSD Bipolar disease Urine drug screen positive for cocaine History of polysubstance abuse and opiate abuse in the past. Current tobacco abuse history  -  11/21/2018 - agitation complicating wean. Some ? Better   plan - start nicotine patch 11/21/2018  - t home prozac 40mg  daily, valproic acid  - Added seroquel 2/7 - continue methadone since 2/10/12020; increase dose 11/21/2018 - versed gtt  - dilaudid gtt - precedex gtt  - rASS goal 0 to -2   Right breast cancer status post lumpectomy T1N0 Home meds: arimidex Holding chemotherapy at this time  Hypothyroidism  TSH wnl on admission. Home meds synthroid 75 mcg  Continue IV synthroid 37.5 mcg daily   Nutrition Tube feedings Add thiamine and folic acid in the setting of substance use.  Vaginal discharge/ HIV negative 11/20/2018. [name] Kitchen Cervicovaginal ancillary showing trichomonas 2/12//2020 -  metronidazole 500mg  bid x7d (2g dose would be too much volume since patient is NPO), partners will need treatment as well   Best practice:  Diet: adult tube feed protocol  Pain/Anxiety/Delirium protocol (if indicated): fentanyl gtt, versed gtt, precedex gtt, methadone VAP protocol (if indicated): Yes DVT prophylaxis: SQ heparin, SCDs GI prophylaxis: famotidine  Glucose control: sSSI Mobility: BR  Code Status: Full  Family Communication: friend at bedside but asleep, unable to answer questions  Disposition: Continue in ICU . Try to get LTAC consult     [place]   The patient Courtney Terry is critically ill with multiple organ systems failure and requires high complexity decision making for assessment and support, frequent evaluation and titration of therapies, application of advanced monitoring technologies and  extensive interpretation of multiple databases.   Critical Care Time devoted to patient care services described in this note is  30  Minutes. This time reflects time of care of this signee Dr Brand Males. This critical care time does not reflect procedure time, or teaching time or supervisory time of PA/NP/Med student/Med Resident etc but could involve care discussion time     Dr. Brand Males, M.D., [facility].C.P Pulmonary and Critical Care Medicine Staff Physician [facility] Pager: [phone], If no answer or between  15:00h - 7:00h: call [phone]  11/21/2018 8:16 AM

## 2018-11-21 NOTE — Progress Notes (Signed)
Hypoglycemic Event  CBG: 61  Treatment:36ml D50  Symptoms: None  Follow-up CBG: Time:0426 CBG Result:113  Possible Reasons for Event: unknown   Margaret Pyle

## 2018-11-21 NOTE — Progress Notes (Signed)
Cortrak Tube Team Note:  Abdominal x-ray report indicates that Cortrak tube is looped in the stomach and tip pointed superiorly. Spoke with RN about adjusting tube. RN reports that patient has been having hypoglycemic events and RN prefers that TF not be stopped in order for Cortrak to be adjusted at this time. RN reports that TF has been infusing without issues/signs of intolerance.   Let RN know that Cortrak service is available next date (2/15) should it be desired that tube be adjusted.     Jarome Matin, MS, RD, LDN, [facility] Inpatient Clinical Dietitian Pager # [phone] After hours/weekend pager # [phone]

## 2018-11-22 LAB — CBC WITH DIFFERENTIAL/PLATELET
Abs Immature Granulocytes: 0.1 10*3/uL — ABNORMAL HIGH (ref 0.00–0.07)
Basophils Absolute: 0 10*3/uL (ref 0.0–0.1)
Basophils Relative: 0 %
Eosinophils Absolute: 0.1 10*3/uL (ref 0.0–0.5)
Eosinophils Relative: 0 %
HCT: 33.6 % — ABNORMAL LOW (ref 36.0–46.0)
HEMOGLOBIN: 10.1 g/dL — AB (ref 12.0–15.0)
Immature Granulocytes: 1 %
Lymphocytes Relative: 8 %
Lymphs Abs: 1.4 10*3/uL (ref 0.7–4.0)
MCH: 26.9 pg (ref 26.0–34.0)
MCHC: 30.1 g/dL (ref 30.0–36.0)
MCV: 89.6 fL (ref 80.0–100.0)
Monocytes Absolute: 2.6 10*3/uL — ABNORMAL HIGH (ref 0.1–1.0)
Monocytes Relative: 15 %
Neutro Abs: 13.9 10*3/uL — ABNORMAL HIGH (ref 1.7–7.7)
Neutrophils Relative %: 76 %
Platelets: 212 10*3/uL (ref 150–400)
RBC: 3.75 MIL/uL — ABNORMAL LOW (ref 3.87–5.11)
RDW: 14.9 % (ref 11.5–15.5)
WBC: 18.1 10*3/uL — ABNORMAL HIGH (ref 4.0–10.5)
nRBC: 0 % (ref 0.0–0.2)

## 2018-11-22 LAB — GLUCOSE, CAPILLARY
Glucose-Capillary: 102 mg/dL — ABNORMAL HIGH (ref 70–99)
Glucose-Capillary: 116 mg/dL — ABNORMAL HIGH (ref 70–99)
Glucose-Capillary: 117 mg/dL — ABNORMAL HIGH (ref 70–99)
Glucose-Capillary: 138 mg/dL — ABNORMAL HIGH (ref 70–99)
Glucose-Capillary: 78 mg/dL (ref 70–99)
Glucose-Capillary: 88 mg/dL (ref 70–99)
Glucose-Capillary: 90 mg/dL (ref 70–99)

## 2018-11-22 LAB — BASIC METABOLIC PANEL
Anion gap: 5 (ref 5–15)
BUN: 14 mg/dL (ref 6–20)
CO2: 33 mmol/L — ABNORMAL HIGH (ref 22–32)
Calcium: 8.6 mg/dL — ABNORMAL LOW (ref 8.9–10.3)
Chloride: 102 mmol/L (ref 98–111)
Creatinine, Ser: 0.33 mg/dL — ABNORMAL LOW (ref 0.44–1.00)
GFR calc Af Amer: >60 mL/min (ref >60)
GFR calc non Af Amer: >60 mL/min (ref >60)
Glucose, Bld: 102 mg/dL — ABNORMAL HIGH (ref 70–99)
Potassium: 3.6 mmol/L (ref 3.5–5.1)
Sodium: 140 mmol/L (ref 135–145)

## 2018-11-22 LAB — HEPATIC FUNCTION PANEL
ALT: 17 U/L (ref 0–44)
AST: 21 U/L (ref 15–41)
Albumin: 2.8 g/dL — ABNORMAL LOW (ref 3.5–5.0)
Alkaline Phosphatase: 51 U/L (ref 38–126)
Bilirubin, Direct: <0.1 mg/dL (ref 0.0–0.2)
Total Bilirubin: 0.6 mg/dL (ref 0.3–1.2)
Total Protein: 6 g/dL — ABNORMAL LOW (ref 6.5–8.1)

## 2018-11-22 LAB — MAGNESIUM: Magnesium: 1.8 mg/dL (ref 1.7–2.4)

## 2018-11-22 LAB — PHOSPHORUS: Phosphorus: 3.6 mg/dL (ref 2.5–4.6)

## 2018-11-22 MED ORDER — ALBUTEROL SULFATE (2.5 MG/3ML) 0.083% IN NEBU: 2.5000 mg | INHALATION_SOLUTION | RESPIRATORY_TRACT | Status: DC | PRN

## 2018-11-22 MED ORDER — METHYLPREDNISOLONE SODIUM SUCC 40 MG IJ SOLR
30.0000 mg | Freq: Every day | INTRAMUSCULAR | Status: DC
  Administered 2018-11-23 – 2018-11-25 (×3): 30 mg via INTRAVENOUS
  Filled 2018-11-22 (×4): qty 0.75

## 2018-11-22 MED ORDER — POTASSIUM CHLORIDE 20 MEQ/15ML (10%) PO SOLN
20.0000 meq | Freq: Once | ORAL | Status: AC
  Administered 2018-11-22: 20 meq
  Filled 2018-11-22: qty 15

## 2018-11-22 MED ORDER — LEVOTHYROXINE SODIUM 75 MCG PO TABS
75.0000 ug | ORAL_TABLET | Freq: Every day | ORAL | Status: DC
  Administered 2018-11-23 – 2018-11-26 (×2): 75 ug via ORAL
  Filled 2018-11-22 (×4): qty 1

## 2018-11-22 MED ORDER — MAGNESIUM SULFATE IN D5W 1-5 GM/100ML-% IV SOLN
1.0000 g | Freq: Once | INTRAVENOUS | Status: AC
  Administered 2018-11-22: 1 g via INTRAVENOUS
  Filled 2018-11-22: qty 100

## 2018-11-22 MED ORDER — POLYETHYLENE GLYCOL 3350 17 G PO PACK
17.0000 g | PACK | Freq: Every day | ORAL | Status: DC | PRN
  Filled 2018-11-22: qty 1

## 2018-11-22 NOTE — Progress Notes (Signed)
Called to room by RN.  On arrival to pt's room, pt being bagged by MD.  RN & MD stated pt having difficulty breathing with decreased spo2.  Pt has episodes of increased agitation with stimulation but will quickly recover.  MD requested pt to be placed back on vent at this time.  Pt placed on PS 12/5 at this time and is tolerating well.  RT will continue to monitor

## 2018-11-22 NOTE — Progress Notes (Signed)
22ml of Versed drip wasted in sink. Witnessed by [employer].

## 2018-11-22 NOTE — Progress Notes (Signed)
Pt placed on ATC at this time tolerating well. RT will monitor

## 2018-11-22 NOTE — Progress Notes (Signed)
NAME:  Courtney Terry, MRN:  [id number], DOB:  Jan 16, 1961, LOS: 69 ADMISSION DATE:  11/08/2018, CONSULTATION DATE:  11/08/18 REFERRING MD:  Karmen Bongo, MD, CHIEF COMPLAINT:  Respiratory failure    Brief History   58 year old female with COPD (FEV1 25%), chronic hypoxemic respiratory failure, with recent hospitalization in December for COPD exacerbation who presents with SOB and AMS. Found to have acute on chronic hypoxemic and hypercarbic respiratory failure requiring intubation. Initially admitted by Hospitalist. PCCM consulted for admission to the intensive care unit after being intubated in the emergency room on 2/2.  UDS positive for cocaine. Of note she has a past medical history of severe COPD FEV1 25% predicted (spirometry 10/2016). CTA completed in the emergency room negative for pulmonary embolism with advanced evidence of COPD.    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 HITT negative.  2/16 Sedate, gtt's being weaned off    Consults:  PCCM   Procedures:  ETT 2/2 >> 2/13 Trach 2/13 >>  Significant Diagnostic Tests:  CTA 2/2: Neg for PE, emphysema, multiple pulmonary nodules, ETT in place CT head w/o contrast 2/2: stable/normal EEG 2/3: moderate global encephalopathy,  No epileptiform activities  UDS 2/2 > cocaine  Micro Data:  Resp PCR 2/2 >> negative  UC 2/2 >> 100k corynebacterium  Sputum 2/2 >> normal flora  BCx2 2/2 >> negative  Cervical Swab 2/14 >> trichomonas   Antimicrobials:  Azithromycin 2/2 >> 2/6 Ceftriaxone 2/3 >>2/7  Interim history/subjective:  RN reports gtt's being weaned off.  Seems to be sedate with methadone.    Objective   Blood pressure 98/71, pulse 73, temperature  99.1 F (37.3 C), temperature source Bladder, resp. rate 12, height 5\' 10"  (1.778 m), weight 56.4 kg, SpO2 96 %.    Vent Mode: PRVC FiO2 (%):  [35 %-40 %] 35 % Set Rate:  [14 bmp] 14 bmp Vt Set:  [540 mL] 540 mL PEEP:  [5 cmH20] 5 cmH20   Intake/Output Summary (Last 24 hours) at 11/22/2018 1440 Last data filed at 11/22/2018 1200 Gross per 24 hour  Intake 2264.31 ml  Output 2155 ml  Net 109.31 ml   Filed Weights   11/21/18 0500 11/21/18 0649 11/22/18 0400  Weight: 57.3 kg 57.3 kg 56.4 kg   EXAM: General: thin adult female lying in bed on ATC HEENT: MM pink/moist, trach clean/dry Neuro: sedate, arouses but drifts back to sleep  CV: s1s2 rrr, no m/r/g PULM: even/non-labored, lungs bilaterally distant but clear, no wheeze  CB:SWHQ, non-tender, bsx4 active  Extremities: warm/dry, no edema  Skin: no rashes or lesions  LABS   PULMONARY No results for input(s): PHART, PCO2ART, PO2ART, HCO3, TCO2, O2SAT in the last 168 hours.  Invalid input(s): PCO2, PO2  CBC Recent Labs  Lab 11/20/18 0444 11/21/18 0348 11/22/18 0430  HGB 10.2* 10.9* 10.1*  HCT 32.8* 36.1 33.6*  WBC 13.6* 14.9* 18.1*  PLT 196 232 212    COAGULATION Recent Labs  Lab 11/19/18 2301  INR 1.02    CARDIAC  No results for input(s): TROPONINI in the last 168 hours. No results for input(s): PROBNP in the last 168 hours.   CHEMISTRY Recent Labs  Lab 11/16/18 0225  11/19/18 0258 11/19/18 1052 11/20/18 0444 11/21/18 7591  11/22/18 0031 11/22/18 0430  NA 137   < > 140 141 140 138 140  --   K 3.9   < > 5.6* 4.2 3.7 3.7 3.6  --   CL 98   < > 105 106 106 102 102  --   CO2 28   < > 28 29 29 28  33*  --   GLUCOSE 105*   < > 88 85 110* 172* 102*  --   BUN 16   < > 15 15 13 17 14   --   CREATININE 0.33*   < > 0.31* 0.33* 0.37* 0.41* 0.33*  --   CALCIUM 8.8*   < > 8.7* 8.8* 8.5* 8.8* 8.6*  --   MG 2.1  --   --   --  1.9 2.2  --  1.8  PHOS 3.9  --   --   --  4.1 4.7*  --  3.6   < > = values in this  interval not displayed.   Estimated Creatinine Clearance: 69.9 mL/min (A) (by C-G formula based on SCr of 0.33 mg/dL (L)).   LIVER Recent Labs  Lab 11/19/18 2301 11/20/18 0444 11/22/18 0430  AST  --  16 21  ALT  --  16 17  ALKPHOS  --  40 51  BILITOT  --  0.6 0.6  PROT  --  5.2* 6.0*  ALBUMIN  --  2.5* 2.8*  INR 1.02  --   --      INFECTIOUS No results for input(s): LATICACIDVEN, PROCALCITON in the last 168 hours.   ENDOCRINE CBG (last 3)  Recent Labs    11/22/18 0424 11/22/18 0815 11/22/18 1238  GLUCAP 88 102* 116*     IMAGING   Dg Chest Port 1 View  Result Date: 11/21/2018 CLINICAL DATA:  Respiratory failure EXAM: PORTABLE CHEST 1 VIEW COMPARISON:  November 20, 2018 FINDINGS: Tracheostomy catheter tip is 8.8 cm above the carina. Feeding tube tip is in the stomach. Central catheter tip is at the cavoatrial junction. No pneumothorax. Lungs hyperexpanded. There is scarring in the right base. There is no appreciable edema or consolidation. Heart size and pulmonary vascularity normal. No adenopathy. Surgical clips are present in the right axillary region. IMPRESSION: Tube and catheter positions as described without pneumothorax. Lungs hyperexpanded. Scarring right base. No edema or consolidation. Stable cardiac silhouette. Postoperative change right breast region. Electronically Signed   By: Lowella Grip III M.D.   On: 11/21/2018 07:58    Resolved Hospital Problem list   Hyperkalemia   Assessment & Plan:   Acute on Chronic Hypoxemic and Hypercarbic Respiratory Failure in setting of severe COPD (FEV1 25%), s/p tracheostomy  P: PRVC as rest mode  Daily SBT with goal for ATC as tolerated  Continue brovana + pulmicort  Reduce solumedrol to 30 mg IV QD Pulmonary hygiene as able / mobilize  Follow intermittent CXR   Thrombocytopenia  -HIT negative  -UE doppler showing acute superficial vein thrombosis in left and right cephalic vein, no need for anticoagulation  given that thrombosis is superficial   P: Continue sq heparin  Monitor CBC  Constipation > Diarrhea (2/16) P: Change bowel regimen to PRN   Chronic Hypotension  P: Continue midodrine   Possible seizure-like activity -valproic acid level <10, possibly non compliant at home. Valproic acid appears to be for mood disorder rather than seizure activity.  -EEG negative for epileptiform activity  P: Continue valproic acid  Schizophrenia PTSD Bipolar disease -Urine drug screen  positive for cocaine -History of polysubstance abuse and opiate abuse in the past. -Current tobacco abuse history P: Wean precedex, dilaudid gtt off 2/16.  Appreciate nursing efforts for weaning.  Continue methadone, last dose increase 2/15  Continue seroquel, monitor QTC  Right breast cancer status post lumpectomy T1N0 -Home meds: arimidex P: Hold chemotherapy while critically ill   Hypothyroidism  -TSH wnl on admission. Home regimen > synthroid 75 mcg  P: Transition synthroid to PT   Moderate Protein Calorie Malnutrition  P: TF per Nutrition  Continue thiamine, folate   Vaginal Discharge / Trichomonas + - HIV negative 2/14  P: Complete flagyl as ordered   Best practice:  Diet: adult tube feed protocol  Pain/Anxiety/Delirium protocol (if indicated): weaning / ordered VAP protocol (if indicated): Yes DVT prophylaxis: SQ heparin, SCDs GI prophylaxis: famotidine  Glucose control: SSI Mobility: BR  Code Status: Full  Family Communication: friend asleep at bedside   Disposition: ICU       Noe Gens, NP-C [place] Pgr: [phone] or if no answer [phone] 11/22/2018, 2:40 PM

## 2018-11-23 ENCOUNTER — Inpatient Hospital Stay (HOSPITAL_COMMUNITY): Payer: Medicaid Other

## 2018-11-23 DIAGNOSIS — G9340 Encephalopathy, unspecified: Secondary | ICD-10-CM

## 2018-11-23 DIAGNOSIS — J441 Chronic obstructive pulmonary disease with (acute) exacerbation: Secondary | ICD-10-CM

## 2018-11-23 DIAGNOSIS — Z930 Tracheostomy status: Secondary | ICD-10-CM

## 2018-11-23 LAB — CBC WITH DIFFERENTIAL/PLATELET
Abs Immature Granulocytes: 0.08 10*3/uL — ABNORMAL HIGH (ref 0.00–0.07)
BASOS ABS: 0 10*3/uL (ref 0.0–0.1)
Basophils Relative: 0 %
Eosinophils Absolute: 0.1 10*3/uL (ref 0.0–0.5)
Eosinophils Relative: 0 %
HCT: 31.9 % — ABNORMAL LOW (ref 36.0–46.0)
Hemoglobin: 9.7 g/dL — ABNORMAL LOW (ref 12.0–15.0)
Immature Granulocytes: 1 %
Lymphocytes Relative: 10 %
Lymphs Abs: 1.6 10*3/uL (ref 0.7–4.0)
MCH: 26.9 pg (ref 26.0–34.0)
MCHC: 30.4 g/dL (ref 30.0–36.0)
MCV: 88.6 fL (ref 80.0–100.0)
Monocytes Absolute: 2 10*3/uL — ABNORMAL HIGH (ref 0.1–1.0)
Monocytes Relative: 13 %
Neutro Abs: 12.3 10*3/uL — ABNORMAL HIGH (ref 1.7–7.7)
Neutrophils Relative %: 76 %
Platelets: 205 10*3/uL (ref 150–400)
RBC: 3.6 MIL/uL — ABNORMAL LOW (ref 3.87–5.11)
RDW: 14.9 % (ref 11.5–15.5)
WBC: 16.1 10*3/uL — ABNORMAL HIGH (ref 4.0–10.5)
nRBC: 0 % (ref 0.0–0.2)

## 2018-11-23 LAB — GLUCOSE, CAPILLARY
GLUCOSE-CAPILLARY: 70 mg/dL (ref 70–99)
Glucose-Capillary: 83 mg/dL (ref 70–99)
Glucose-Capillary: 110 mg/dL — ABNORMAL HIGH (ref 70–99)
Glucose-Capillary: 96 mg/dL (ref 70–99)
Glucose-Capillary: 69 mg/dL — ABNORMAL LOW (ref 70–99)
Glucose-Capillary: 89 mg/dL (ref 70–99)

## 2018-11-23 LAB — BASIC METABOLIC PANEL
Anion gap: 9 (ref 5–15)
BUN: 12 mg/dL (ref 6–20)
CO2: 31 mmol/L (ref 22–32)
Calcium: 9.2 mg/dL (ref 8.9–10.3)
Chloride: 100 mmol/L (ref 98–111)
Creatinine, Ser: 0.33 mg/dL — ABNORMAL LOW (ref 0.44–1.00)
GFR calc non Af Amer: >60 mL/min (ref >60)
Glucose, Bld: 76 mg/dL (ref 70–99)
Potassium: 3.7 mmol/L (ref 3.5–5.1)
Sodium: 140 mmol/L (ref 135–145)

## 2018-11-23 LAB — PHOSPHORUS: Phosphorus: 3.2 mg/dL (ref 2.5–4.6)

## 2018-11-23 LAB — MAGNESIUM: Magnesium: 1.8 mg/dL (ref 1.7–2.4)

## 2018-11-23 MED ORDER — METHADONE HCL 5 MG PO TABS: 10.0000 mg | ORAL_TABLET | Freq: Every day | ORAL | Status: DC

## 2018-11-23 MED ORDER — ONDANSETRON HCL 4 MG/2ML IJ SOLN
INTRAMUSCULAR | Status: AC
  Filled 2018-11-23: qty 2

## 2018-11-23 MED ORDER — ONDANSETRON HCL 4 MG/2ML IJ SOLN
4.0000 mg | Freq: Once | INTRAMUSCULAR | Status: AC
  Administered 2018-11-23: 4 mg via INTRAVENOUS

## 2018-11-23 MED ORDER — DEXTROSE 50 % IV SOLN
25.0000 mL | Freq: Once | INTRAVENOUS | Status: AC
  Administered 2018-11-23: 25 mL via INTRAVENOUS
  Filled 2018-11-23: qty 50

## 2018-11-23 MED ORDER — VALPROATE SODIUM 500 MG/5ML IV SOLN
500.0000 mg | Freq: Three times a day (TID) | INTRAVENOUS | Status: DC
  Administered 2018-11-23 – 2018-11-24 (×3): 500 mg via INTRAVENOUS
  Filled 2018-11-23 (×4): qty 5

## 2018-11-23 MED ORDER — CLONAZEPAM 0.5 MG PO TBDP: 0.5000 mg | ORAL_TABLET | Freq: Three times a day (TID) | ORAL | Status: DC | PRN

## 2018-11-23 MED ORDER — FOLIC ACID 5 MG/ML IJ SOLN
1.0000 mg | Freq: Every day | INTRAMUSCULAR | Status: DC
  Administered 2018-11-23 – 2018-11-26 (×4): 1 mg via INTRAVENOUS
  Filled 2018-11-23 (×4): qty 0.2

## 2018-11-23 MED ORDER — FAMOTIDINE IN NACL 20-0.9 MG/50ML-% IV SOLN
20.0000 mg | Freq: Two times a day (BID) | INTRAVENOUS | Status: DC
  Administered 2018-11-23 – 2018-11-26 (×7): 20 mg via INTRAVENOUS
  Filled 2018-11-23 (×7): qty 50

## 2018-11-23 MED ORDER — FENTANYL CITRATE (PF) 100 MCG/2ML IJ SOLN
25.0000 ug | INTRAMUSCULAR | Status: DC | PRN
  Administered 2018-11-23: 25 ug via INTRAVENOUS
  Filled 2018-11-23: qty 2

## 2018-11-23 MED ORDER — MAGNESIUM SULFATE IN D5W 1-5 GM/100ML-% IV SOLN
1.0000 g | Freq: Once | INTRAVENOUS | Status: AC
  Administered 2018-11-23: 1 g via INTRAVENOUS
  Filled 2018-11-23: qty 100

## 2018-11-23 MED ORDER — POTASSIUM CHLORIDE 10 MEQ/50ML IV SOLN
10.0000 meq | INTRAVENOUS | Status: AC
  Administered 2018-11-23 (×2): 10 meq via INTRAVENOUS
  Filled 2018-11-23 (×2): qty 50

## 2018-11-23 MED ORDER — POTASSIUM CHLORIDE 20 MEQ/15ML (10%) PO SOLN
20.0000 meq | Freq: Once | ORAL | Status: DC
  Filled 2018-11-23: qty 15

## 2018-11-23 MED ORDER — THIAMINE HCL 100 MG/ML IJ SOLN
100.0000 mg | Freq: Every day | INTRAMUSCULAR | Status: DC
  Administered 2018-11-23 – 2018-11-24 (×2): 100 mg via INTRAVENOUS
  Filled 2018-11-23 (×2): qty 2

## 2018-11-23 MED ORDER — METOCLOPRAMIDE HCL 5 MG/ML IJ SOLN
10.0000 mg | Freq: Four times a day (QID) | INTRAMUSCULAR | Status: AC
  Administered 2018-11-23 – 2018-11-25 (×8): 10 mg via INTRAVENOUS
  Filled 2018-11-23 (×8): qty 2

## 2018-11-23 MED ORDER — MIDODRINE HCL 5 MG PO TABS
5.0000 mg | ORAL_TABLET | Freq: Three times a day (TID) | ORAL | Status: DC
  Filled 2018-11-23 (×4): qty 1

## 2018-11-23 NOTE — Progress Notes (Signed)
Portable abdomen complete

## 2018-11-23 NOTE — Progress Notes (Addendum)
NAME:  Courtney Terry, MRN:  [id number], DOB:  September 05, 1961, LOS: 49 ADMISSION DATE:  11/08/2018, CONSULTATION DATE:  11/08/18 REFERRING MD:  Karmen Bongo, MD, CHIEF COMPLAINT:  Respiratory failure    Brief History   58 year old female with COPD (FEV1 25%), chronic hypoxemic respiratory failure, with recent hospitalization in December for COPD exacerbation who presents with SOB and AMS. Found to have acute on chronic hypoxemic and hypercarbic respiratory failure requiring intubation. Initially admitted by Hospitalist. PCCM consulted for admission to the intensive care unit after being intubated in the emergency room on 2/2.  UDS positive for cocaine. Of note she has a past medical history of severe COPD FEV1 25% predicted (spirometry 10/2016). CTA completed in the emergency room negative for pulmonary embolism with advanced evidence of COPD.   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 HITT negative.  2/16 Sedate, gtt's being weaned off, on ATC during day but then required MV for resp distress  Consults:  PCCM   Procedures:  ETT 2/2 >> 2/13 Trach 2/13 >> R PICC 2/13 >>  Significant Diagnostic Tests:  CTA 2/2: Neg for PE, emphysema, multiple pulmonary nodules, ETT in place CT head w/o contrast 2/2: stable/normal EEG 2/3: moderate global encephalopathy,  No epileptiform activities  UDS 2/2 > cocaine  Micro Data:  Resp PCR 2/2 >> negative  UC 2/2 >> 100k corynebacterium  Sputum 2/2 >> normal flora  BCx2 2/2 >> negative  Cervical Swab 2/14 >> trichomonas   Antimicrobials:  Azithromycin 2/2 >> 2/6 Ceftriaxone 2/3 >>2/7 Flagyl 2/14 >>  Interim history/subjective:  On ATC most of yesterday prior to being placed  on MV for resp distress.   Intermittent shaking episodes Patient complains of slight abd pain, had x 3 stools with recent laxative use  Objective   Blood pressure (!) 151/98, pulse 92, temperature 99.7 F (37.6 C), temperature source Oral, resp. rate 19, height 5\' 10"  (1.778 m), weight 54.1 kg, SpO2 99 %.    Vent Mode: PRVC FiO2 (%):  [35 %-40 %] 40 % Set Rate:  [14 bmp] 14 bmp Vt Set:  [540 mL] 540 mL PEEP:  [5 cmH20] 5 cmH20 Pressure Support:  [12 cmH20] 12 cmH20 Plateau Pressure:  [22 cmH20] 22 cmH20   Intake/Output Summary (Last 24 hours) at 11/23/2018 0755 Last data filed at 11/23/2018 0600 Gross per 24 hour  Intake 2146.53 ml  Output 1550 ml  Net 596.53 ml   Filed Weights   11/21/18 0649 11/22/18 0400 11/23/18 0500  Weight: 57.3 kg 56.4 kg 54.1 kg   EXAM: General:  Thin adult female lying in bed in NAD, some generalized tremors but then calmed HEENT: MM pink/moist, midline cuffed sutured trach Neuro: Awake, f/c, MAE CV: rrr, no m/r/g PULM: even/non-labored on full MV, lungs bilaterally clear, good airmovement, no wheeze GI: taut, non-tender, bs active  Extremities: warm/dry, no edema, atrophy Skin: no rashes   LABS   PULMONARY No results for input(s): PHART, PCO2ART, PO2ART, HCO3, TCO2, O2SAT in the last 168 hours.  Invalid input(s): PCO2, PO2  CBC Recent Labs  Lab 11/21/18 0348 11/22/18 0430 11/23/18 0304  HGB 10.9* 10.1* 9.7*  HCT 36.1 33.6* 31.9*  WBC 14.9* 18.1* 16.1*  PLT 232 212 205  COAGULATION Recent Labs  Lab 11/19/18 2301  INR 1.02    CARDIAC  No results for input(s): TROPONINI in the last 168 hours. No results for input(s): PROBNP in the last 168 hours.   CHEMISTRY Recent Labs  Lab 11/19/18 1052 11/20/18 0444 11/21/18 0348 11/22/18 0031 11/22/18 0430 11/23/18 0304  NA 141 140 138 140  --  140  K 4.2 3.7 3.7 3.6  --  3.7  CL 106 106 102 102  --  100  CO2 29 29 28  33*  --  31  GLUCOSE 85 110* 172* 102*  --  76  BUN 15 13  17 14   --  12  CREATININE 0.33* 0.37* 0.41* 0.33*  --  0.33*  CALCIUM 8.8* 8.5* 8.8* 8.6*  --  9.2  MG  --  1.9 2.2  --  1.8 1.8  PHOS  --  4.1 4.7*  --  3.6 3.2   Estimated Creatinine Clearance: 67.1 mL/min (A) (by C-G formula based on SCr of 0.33 mg/dL (L)).   LIVER Recent Labs  Lab 11/19/18 2301 11/20/18 0444 11/22/18 0430  AST  --  16 21  ALT  --  16 17  ALKPHOS  --  40 51  BILITOT  --  0.6 0.6  PROT  --  5.2* 6.0*  ALBUMIN  --  2.5* 2.8*  INR 1.02  --   --     INFECTIOUS No results for input(s): LATICACIDVEN, PROCALCITON in the last 168 hours.   ENDOCRINE CBG (last 3)  Recent Labs    11/22/18 1959 11/22/18 2308 11/23/18 0317  GLUCAP 78 117* 70     IMAGING   Dg Chest Port 1 View  Result Date: 11/23/2018 CLINICAL DATA:  Acute respiratory failure with hypoxia. EXAM: PORTABLE CHEST 1 VIEW COMPARISON:  11/21/2018 FINDINGS: Stable pulmonary hyperinflation. Heart size and mediastinal contours are stable and within normal limits. Tracheostomy tube tip terminates over the superior mediastinum. Weighted feeding tube extends below the left hemidiaphragm. The tip is excluded. Right-sided approach catheter terminates in the distal SVC. No pneumothorax. Pulmonary hyperinflation with bibasilar subsegmental atelectasis. No overt pulmonary edema. Mild emphysematous hyperinflation of the lungs. IMPRESSION: Stable support line and tube positions. Pulmonary hyperinflation without change. Electronically Signed   By: Ashley Royalty M.D.   On: 11/23/2018 04:00    Resolved Hospital Problem list   Hyperkalemia   Assessment & Plan:   Acute on Chronic Hypoxemic and Hypercarbic Respiratory Failure in setting of severe COPD (FEV1 25%), s/p tracheostomy  - CXR 2/17 with stable tubes/ lines, no acute issues P: PRVC as rest mode, will l;ikely need qHS support longterm Daily SBT with goal for ATC as tolerated  Continue brovana + pulmicort, duonebs q 6 solumedrol 30 mg IV QD- slow taper/  reduced 2/17 Pulmonary hygiene as able / mobilize  Follow intermittent CXR   Thrombocytopenia - resolved  -HIT negative  -UE doppler showing acute superficial vein thrombosis in left and right cephalic vein, no need for anticoagulation given that thrombosis is superficial   P: Continue sq heparin  Monitor CBC  Constipation > Diarrhea (2/16) P: Change bowel regimen to PRN   Chronic Hypotension  - SBP reaching 180's P: Decrease midodrine from 10 to 5mg  q 8 hr  Possible seizure-like activity -valproic acid level <10, possibly non compliant at home. Valproic acid appears to be for mood disorder rather than seizure activity.  -EEG negative for epileptiform activity  P: Increase valproic acid from BID to TID  Schizophrenia PTSD Bipolar disease -Urine drug screen positive for cocaine -History of polysubstance abuse and opiate abuse in the past. -Current tobacco abuse history P: Continue methadone, started 2/15  Continue seroquel, monitor QTC (0.434 2/17) Add klonopin 0.5mg  TID prn only Valproic acid as above  Right breast cancer status post lumpectomy T1N0 -Home meds: arimidex P: Hold chemotherapy while critically ill   Hypothyroidism  -TSH wnl on admission P: synthroid 75 mcg per tube daily  Moderate Protein Calorie Malnutrition  P: TF per Nutrition  Continue thiamine, folate   Vaginal Discharge / Trichomonas + - HIV negative 2/14  P: Complete flagyl as ordered - 7 day course  Best practice:  Diet: adult tube feed protocol  Pain/Anxiety/Delirium protocol (if indicated): prn  VAP protocol (if indicated): Yes DVT prophylaxis: SQ heparin, SCDs GI prophylaxis: famotidine  Glucose control: SSI Mobility: BR  Code Status: Full  Family Communication: friend asleep at bedside  Disposition: ICU       Kennieth Rad, MSN, AGACNP-BC [facility] Pgr: [phone] or if no answer [phone] 11/23/2018, 7:56 AM

## 2018-11-23 NOTE — Progress Notes (Signed)
Notified by RN of episode of vomiting.  Cortrak was placed to suction with 500 ml output thus far- mostly bilious.  No am meds given thus far.  Given some abd discomfort this morning, will need to rule out ileus.   P:  zofran x 1 KUB now Hold TF for now  Kennieth Rad, MSN, AGACNP-BC [facility] Pgr: [phone] or if no answer [phone] 11/23/2018, 9:11 AM '

## 2018-11-23 NOTE — Progress Notes (Signed)
Patient vomiting light green emesis again . Also noted in ventilator tubing to trach . I  Suctioned patient and Heather RT in to change circuit

## 2018-11-23 NOTE — Progress Notes (Signed)
Patient does appear to be in any respiratory distress at this time. Sp0295-97% with respiratory rate 14-22. Placed patient back on vent to rest overnight, will continue with wean in the morning.

## 2018-11-23 NOTE — Progress Notes (Signed)
Physical Therapy Treatment Patient Details Name: Courtney Terry MRN: [id number] DOB: [birth year] Today's Date: 11/23/2018    History of Present Illness Pt is a 58 year old woman admitted on 11/08/18 with acute on chronic hypoxic/hypercarbic respiratory failure/trach (being treated as severe COPD exacerbation) with acute encepholopathy, and moderate protein calorie malnutrition (on tube feeds).  Intubated 2/2-2/13, trach on 2/13 and remains on ventilator with trach collar trials. Hospital course complicated by agitation, ileus (with NGT to suction). PMH: COPD on 2L home 02, bipolar, PTSD, HTN, cocaine abuse, breast CA s/p mastectomy and multiple bil reconstructions.     PT Comments    Pt was able to tolerate mobility with PT on TC FiO2 40% today.  She did desat occasionally to 85%, but I think she was holding her breath as when cued to take some deep breaths, she was able to manually get O2 back up to >90%.  Pt remains very restless and distracted by lines and gown.  She sat EOB with VSS for ~15 mins before requesting to return to supine.  PT will continue to follow acutely for safe mobility progression.    Follow Up Recommendations  CIR     Equipment Recommendations  Rolling walker with 5" wheels;3in1 (PT);Wheelchair (measurements PT);Wheelchair cushion (measurements PT)    Recommendations for Other Services Rehab consult     Precautions / Restrictions Precautions Precautions: Fall Precaution Comments: trach collar, NGT, coretrack feeding tube    Mobility  Bed Mobility Overal bed mobility: Needs Assistance Bed Mobility: Supine to Sit;Sit to Supine     Supine to sit: Mod assist;HOB elevated Sit to supine: Mod assist;HOB elevated   General bed mobility comments: Mod assist to come to EOB and support trunk and help initiate movement of legs to the side.  Pt with posterior lean/push throughout transitions.           Balance Overall balance assessment: Needs  assistance Sitting-balance support: Feet supported;Bilateral upper extremity supported Sitting balance-Leahy Scale: Poor Sitting balance - Comments: Min guard assist EOB due to posterior lean/preference.  Pt sat EOB working on deep breathing (as she would desat every few mins to 85% on 40% TC and when cued to breathe deeply would come back up into the 90s).  Pt was EOB ~15 mins before requesting to lay back down.  Postural control: Posterior lean                                  Cognition Arousal/Alertness: Awake/alert Behavior During Therapy: Restless;Impulsive Overall Cognitive Status: Impaired/Different from baseline Area of Impairment: Attention;Memory;Safety/judgement;Following commands;Awareness;Problem solving                   Current Attention Level: Focused Memory: Decreased short-term memory;Decreased recall of precautions Following Commands: Follows one step commands inconsistently;Follows one step commands with increased time Safety/Judgement: Decreased awareness of safety;Decreased awareness of deficits Awareness: Intellectual Problem Solving: Slow processing;Decreased initiation;Difficulty sequencing;Requires verbal cues;Requires tactile cues General Comments: Pt restless and fidgety with her lines, gown, nearby towel.  She is able to follow basic commands, but needs multimodal cues to stay focused on task.              Pertinent Vitals/Pain Pain Assessment: Faces Faces Pain Scale: No hurt           PT Goals (current goals can now be found in the care plan section) Acute Rehab PT Goals Patient Stated Goal: unable to  state, agreeable with head nod to participate. Progress towards PT goals: Progressing toward goals    Frequency    Min 3X/week      PT Plan Current plan remains appropriate       AM-PAC PT "6 Clicks" Mobility   Outcome Measure  Help needed turning from your back to your side while in a flat bed without using  bedrails?: A Lot Help needed moving from lying on your back to sitting on the side of a flat bed without using bedrails?: A Lot Help needed moving to and from a bed to a chair (including a wheelchair)?: A Lot Help needed standing up from a chair using your arms (e.g., wheelchair or bedside chair)?: Total Help needed to walk in hospital room?: Total Help needed climbing 3-5 steps with a railing? : Total 6 Click Score: 9    End of Session Equipment Utilized During Treatment: Oxygen;Other (comment)(trach collar 40%FiO2) Activity Tolerance: Patient limited by fatigue Patient left: in bed;with call bell/phone within reach;with bed alarm set Nurse Communication: Mobility status PT Visit Diagnosis: Unsteadiness on feet (R26.81)     Time: 1610-9604 PT Time Calculation (min) (ACUTE ONLY): 21 min  Charges:  $Therapeutic Activity: 8-22 mins          Niana Martorana B. Quron Ruddy, PT, DPT  Acute Rehabilitation [phone] pager #[phone] office             11/23/2018, 4:56 PM

## 2018-11-23 NOTE — Progress Notes (Signed)
SLP Cancellation Note  Patient Details Name: Courtney Terry MRN: [id number] DOB: [birth year]   Cancelled treatment:       Reason Eval/Treat Not Completed: Medical issues which prohibited therapy.  Episode of vomiting this am; pt back on vent, TF held.  Not ready for SLP interventions at this time.  Will follow closely for readiness.   Courtney Terry L. Tivis Ringer, [place] CCC/SLP Acute Rehabilitation Services Office number [phone] Pager [phone]    Courtney Terry 11/23/2018, 11:03 AM

## 2018-11-23 NOTE — Progress Notes (Signed)
Patient with abrupt onset of vomiting light green emesis approximately 400 ccs. Jennelle Human PA informed . Airway suctioned and oral care given and zofran administered per orders . Tube feeding held as well as meds per tube

## 2018-11-24 DIAGNOSIS — J9602 Acute respiratory failure with hypercapnia: Secondary | ICD-10-CM

## 2018-11-24 LAB — CBC WITH DIFFERENTIAL/PLATELET
ABS IMMATURE GRANULOCYTES: 0.07 10*3/uL (ref 0.00–0.07)
Basophils Absolute: 0 10*3/uL (ref 0.0–0.1)
Basophils Relative: 0 %
Eosinophils Absolute: 0 10*3/uL (ref 0.0–0.5)
Eosinophils Relative: 0 %
HCT: 30.8 % — ABNORMAL LOW (ref 36.0–46.0)
Hemoglobin: 9.6 g/dL — ABNORMAL LOW (ref 12.0–15.0)
IMMATURE GRANULOCYTES: 1 %
LYMPHS ABS: 1.3 10*3/uL (ref 0.7–4.0)
Lymphocytes Relative: 8 %
MCH: 27.5 pg (ref 26.0–34.0)
MCHC: 31.2 g/dL (ref 30.0–36.0)
MCV: 88.3 fL (ref 80.0–100.0)
Monocytes Absolute: 1.2 10*3/uL — ABNORMAL HIGH (ref 0.1–1.0)
Monocytes Relative: 8 %
NEUTROS PCT: 83 %
Neutro Abs: 12.7 10*3/uL — ABNORMAL HIGH (ref 1.7–7.7)
Platelets: 200 10*3/uL (ref 150–400)
RBC: 3.49 MIL/uL — ABNORMAL LOW (ref 3.87–5.11)
RDW: 15.1 % (ref 11.5–15.5)
WBC: 15.2 10*3/uL — ABNORMAL HIGH (ref 4.0–10.5)
nRBC: 0 % (ref 0.0–0.2)

## 2018-11-24 LAB — GLUCOSE, CAPILLARY
Glucose-Capillary: 67 mg/dL — ABNORMAL LOW (ref 70–99)
Glucose-Capillary: 79 mg/dL (ref 70–99)
Glucose-Capillary: 94 mg/dL (ref 70–99)
Glucose-Capillary: 86 mg/dL (ref 70–99)
Glucose-Capillary: 96 mg/dL (ref 70–99)
Glucose-Capillary: 76 mg/dL (ref 70–99)

## 2018-11-24 LAB — BASIC METABOLIC PANEL
ANION GAP: 10 (ref 5–15)
BUN: 10 mg/dL (ref 6–20)
CO2: 29 mmol/L (ref 22–32)
Calcium: 8.9 mg/dL (ref 8.9–10.3)
Chloride: 98 mmol/L (ref 98–111)
Creatinine, Ser: 0.35 mg/dL — ABNORMAL LOW (ref 0.44–1.00)
GFR calc non Af Amer: >60 mL/min (ref >60)
Glucose, Bld: 86 mg/dL (ref 70–99)
Potassium: 3.4 mmol/L — ABNORMAL LOW (ref 3.5–5.1)
Sodium: 137 mmol/L (ref 135–145)

## 2018-11-24 LAB — MAGNESIUM: Magnesium: 1.7 mg/dL (ref 1.7–2.4)

## 2018-11-24 LAB — PHOSPHORUS: Phosphorus: 2.9 mg/dL (ref 2.5–4.6)

## 2018-11-24 MED ORDER — METRONIDAZOLE 500 MG PO TABS
500.0000 mg | ORAL_TABLET | Freq: Two times a day (BID) | ORAL | Status: DC
  Filled 2018-11-24: qty 1

## 2018-11-24 MED ORDER — DEXTROSE 50 % IV SOLN
25.0000 mL | Freq: Once | INTRAVENOUS | Status: AC
  Administered 2018-11-24: 25 mL via INTRAVENOUS

## 2018-11-24 MED ORDER — VALPROATE SODIUM 500 MG/5ML IV SOLN
250.0000 mg | Freq: Three times a day (TID) | INTRAVENOUS | Status: DC
  Administered 2018-11-24 – 2018-11-27 (×10): 250 mg via INTRAVENOUS
  Filled 2018-11-24 (×13): qty 2.5

## 2018-11-24 MED ORDER — DEXTROSE 10 % IV SOLN
INTRAVENOUS | Status: DC
  Administered 2018-11-24 – 2018-11-25 (×2): via INTRAVENOUS

## 2018-11-24 MED ORDER — VITAMIN B-1 100 MG PO TABS
100.0000 mg | ORAL_TABLET | Freq: Every day | ORAL | Status: DC
  Administered 2018-11-25 – 2018-11-27 (×3): 100 mg
  Filled 2018-11-24 (×3): qty 1

## 2018-11-24 NOTE — Progress Notes (Signed)
NAME:  Courtney Terry, MRN:  [id number], DOB:  [birth year], LOS: 11 ADMISSION DATE:  11/08/2018, CONSULTATION DATE:  11/08/2018 REFERRING MD:  Karmen Bongo, CHIEF COMPLAINT:  Acute on chronic respiratory failure with hypoxemia   Brief History   58 y/o female with very severe COPD admitted 2/2 with exacerbation of the same requiring intubation and eventual tracheostomy on 2/13 for prolonge respiratory failure.     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 HITT negative.  2/16 Sedate, gtt's being weaned off, on ATC during day but then required MV for resp distress  Consults:  PCCM   Procedures:  ETT 2/2 >> 2/13 Trach 2/13 >> R PICC 2/13 >>  Significant Diagnostic Tests:  CTA 2/2: Neg for PE, emphysema, multiple pulmonary nodules, ETT in place CT head w/o contrast 2/2: stable/normal EEG 2/3: moderate global encephalopathy, No epileptiform activities  UDS 2/2 >cocaine  Micro Data:  Resp PCR 2/2 >> negative  UC 2/2 >> 100k corynebacterium  Sputum 2/2 >> normal flora  BCx2 2/2 >> negative  Cervical Swab 2/14 >> trichomonas   Antimicrobials:  Azithromycin 2/2 >> 2/6 Ceftriaxone 2/3 >>2/7 Flagyl 2/14 >> 2/18   Interim history/subjective:  More awake and alert Belly less distended Wants to eat  Objective   Blood pressure (!) 147/102, pulse 84, temperature 98.7 F (37.1 C), temperature source Oral, resp. rate 19, height 5\' 10"  (1.778 m), weight 51.5 kg, SpO2 96 %.    Vent Mode: PRVC FiO2 (%):  [28 %-35 %] 28 % Set Rate:  [14 bmp] 14 bmp Vt Set:  [540 mL] 540 mL PEEP:  [5 cmH20] 5 cmH20 Plateau Pressure:  [14 cmH20-17 cmH20] 17 cmH20   Intake/Output Summary (Last 24 hours) at 11/24/2018  1304 Last data filed at 11/24/2018 1200 Gross per 24 hour  Intake 650 ml  Output 2250 ml  Net -1600 ml   Filed Weights   11/22/18 0400 11/23/18 0500 11/24/18 0500  Weight: 56.4 kg 54.1 kg 51.5 kg    Examination:  General:  In bed on vent HENT: NCAT ETT in place PULM: CTA B, vent supported breathing CV: RRR, no mgr GI: BS+, soft, nontender MSK: normal bulk and tone Neuro: sedated on vent   Resolved Hospital Problem list   Thrombocytopenia  Assessment & Plan:  Acute on chronic respiratory failure with hypoxemia, severe COPD > attempt trach collar trial 24 hours today > continue brovana/pulmicort > continue solumedrol until able to use gut, on pred chronically at home > prn albuterol  Constipation/belly distension: ileus, improved by exam today > continue NG > minimize narcotics > consider advancing diet/tube feedign on 2/19 > monitor for bowel movement  Chronic hypotension > wean off midodrine > continue florinef  Seizure activity? > wean valproate > monitor for seizure  Schizophrenia PTSD Bipolar disease Acute encephalopathy, toxic metabolic > wean valproate > hold antipsychotics (not on any at home?)  Hypothyroidism > synthroid  Moderate protein calorie malnutrition > start tube feeding on 2/19 if belly exam OK  Vaginal trichomonas > stop metronidazole  Best practice:  Diet: holding today Pain/Anxiety/Delirium protocol (if indicated): wean VAP protocol (if indicated): yes DVT prophylaxis: SCD GI prophylaxis: famotidine Glucose control: SSI Mobility: Advance, moblize as able Code Status: full  Family Communication:  updated sister bedside Disposition: step down status, to [facility]  Labs   CBC: Recent Labs  Lab 11/20/18 0444 11/21/18 0348 11/22/18 0430 11/23/18 0304 11/24/18 0430  WBC 13.6* 14.9* 18.1* 16.1* 15.2*  NEUTROABS  --   --  13.9* 12.3* 12.7*  HGB 10.2* 10.9* 10.1* 9.7* 9.6*  HCT 32.8* 36.1 33.6* 31.9* 30.8*  MCV 89.6 88.7 89.6  88.6 88.3  PLT 196 232 212 205 329    Basic Metabolic Panel: Recent Labs  Lab 11/20/18 0444 11/21/18 0348 11/22/18 0031 11/22/18 0430 11/23/18 0304 11/24/18 0430  NA 140 138 140  --  140 137  K 3.7 3.7 3.6  --  3.7 3.4*  CL 106 102 102  --  100 98  CO2 29 28 33*  --  31 29  GLUCOSE 110* 172* 102*  --  76 86  BUN 13 17 14   --  12 10  CREATININE 0.37* 0.41* 0.33*  --  0.33* 0.35*  CALCIUM 8.5* 8.8* 8.6*  --  9.2 8.9  MG 1.9 2.2  --  1.8 1.8 1.7  PHOS 4.1 4.7*  --  3.6 3.2 2.9   GFR: Estimated Creatinine Clearance: 63.8 mL/min (A) (by C-G formula based on SCr of 0.35 mg/dL (L)). Recent Labs  Lab 11/21/18 0348 11/22/18 0430 11/23/18 0304 11/24/18 0430  WBC 14.9* 18.1* 16.1* 15.2*    Liver Function Tests: Recent Labs  Lab 11/20/18 0444 11/22/18 0430  AST 16 21  ALT 16 17  ALKPHOS 40 51  BILITOT 0.6 0.6  PROT 5.2* 6.0*  ALBUMIN 2.5* 2.8*   No results for input(s): LIPASE, AMYLASE in the last 168 hours. No results for input(s): AMMONIA in the last 168 hours.  ABG    Component Value Date/Time   PHART 7.441 11/10/2018 0346   PCO2ART 54.9 (H) 11/10/2018 0346   PO2ART 107.0 11/10/2018 0346   HCO3 37.3 (H) 11/10/2018 0346   TCO2 39 (H) 11/10/2018 0346   ACIDBASEDEF 0.4 06/30/2014 1033   O2SAT 98.0 11/10/2018 0346     Coagulation Profile: Recent Labs  Lab 11/19/18 2301  INR 1.02    Cardiac Enzymes: No results for input(s): CKTOTAL, CKMB, CKMBINDEX, TROPONINI in the last 168 hours.  HbA1C: Hgb A1c MFr Bld  Date/Time Value Ref Range Status  02/26/2016 04:15 PM 6.2 (H) 4.8 - 5.6 % Final    Comment:    (NOTE)         Pre-diabetes: 5.7 - 6.4         Diabetes: >6.4         Glycemic control for adults with diabetes: <7.0     CBG: Recent Labs  Lab 11/23/18 2039 11/23/18 2340 11/24/18 0407 11/24/18 0752 11/24/18 1153  GLUCAP 89 67* 79 94 86     Critical care time: 35 minutes    Roselie Awkward, MD [facility] Pager: [phone] Cell:  [phone] If no response, call [phone]

## 2018-11-24 NOTE — Progress Notes (Signed)
[facility] Progress Note Patient Name: Courtney Terry DOB: 1961/04/06 MRN: [id number]   Date of Service  11/24/2018  HPI/Events of Note  Hypoglycemia - Blood glucose = 69 --> 89. Tube feeds on hold d/t N/V.  eICU Interventions  Will order: 1. D10W to run IV at 20 mL/hour.      Intervention Category Major Interventions: Other:  Deray Dawes Cornelia Copa 11/24/2018, 12:03 AM
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
## 2018-11-24 NOTE — Progress Notes (Signed)
Placed patient on 35% trach collar will continue to monitor patient.

## 2018-11-25 ENCOUNTER — Other Ambulatory Visit: Payer: Self-pay

## 2018-11-25 ENCOUNTER — Inpatient Hospital Stay (HOSPITAL_COMMUNITY): Payer: Medicaid Other

## 2018-11-25 DIAGNOSIS — E039 Hypothyroidism, unspecified: Secondary | ICD-10-CM

## 2018-11-25 LAB — BASIC METABOLIC PANEL
Anion gap: 11 (ref 5–15)
BUN: 8 mg/dL (ref 6–20)
CO2: 30 mmol/L (ref 22–32)
Calcium: 8.9 mg/dL (ref 8.9–10.3)
Chloride: 97 mmol/L — ABNORMAL LOW (ref 98–111)
Creatinine, Ser: 0.36 mg/dL — ABNORMAL LOW (ref 0.44–1.00)
GFR calc Af Amer: >60 mL/min (ref >60)
GFR calc non Af Amer: >60 mL/min (ref >60)
Glucose, Bld: 83 mg/dL (ref 70–99)
Potassium: 2.9 mmol/L — ABNORMAL LOW (ref 3.5–5.1)
Sodium: 138 mmol/L (ref 135–145)

## 2018-11-25 LAB — GLUCOSE, CAPILLARY
GLUCOSE-CAPILLARY: 77 mg/dL (ref 70–99)
Glucose-Capillary: 101 mg/dL — ABNORMAL HIGH (ref 70–99)
Glucose-Capillary: 107 mg/dL — ABNORMAL HIGH (ref 70–99)
Glucose-Capillary: 42 mg/dL — CL (ref 70–99)
Glucose-Capillary: 81 mg/dL (ref 70–99)
Glucose-Capillary: 88 mg/dL (ref 70–99)
Glucose-Capillary: 96 mg/dL (ref 70–99)
Glucose-Capillary: 98 mg/dL (ref 70–99)

## 2018-11-25 LAB — CBC WITH DIFFERENTIAL/PLATELET
Abs Immature Granulocytes: 0.05 10*3/uL (ref 0.00–0.07)
BASOS PCT: 0 %
Basophils Absolute: 0 10*3/uL (ref 0.0–0.1)
Eosinophils Absolute: 0 10*3/uL (ref 0.0–0.5)
Eosinophils Relative: 0 %
HCT: 34.5 % — ABNORMAL LOW (ref 36.0–46.0)
Hemoglobin: 10.6 g/dL — ABNORMAL LOW (ref 12.0–15.0)
Immature Granulocytes: 0 %
Lymphocytes Relative: 9 %
Lymphs Abs: 1.3 10*3/uL (ref 0.7–4.0)
MCH: 27.2 pg (ref 26.0–34.0)
MCHC: 30.7 g/dL (ref 30.0–36.0)
MCV: 88.5 fL (ref 80.0–100.0)
Monocytes Absolute: 0.9 10*3/uL (ref 0.1–1.0)
Monocytes Relative: 7 %
Neutro Abs: 11.2 10*3/uL — ABNORMAL HIGH (ref 1.7–7.7)
Neutrophils Relative %: 84 %
Platelets: 226 10*3/uL (ref 150–400)
RBC: 3.9 MIL/uL (ref 3.87–5.11)
RDW: 14.7 % (ref 11.5–15.5)
WBC: 13.5 10*3/uL — ABNORMAL HIGH (ref 4.0–10.5)
nRBC: 0 % (ref 0.0–0.2)

## 2018-11-25 LAB — MAGNESIUM: MAGNESIUM: 1.7 mg/dL (ref 1.7–2.4)

## 2018-11-25 LAB — PHOSPHORUS: PHOSPHORUS: 3.5 mg/dL (ref 2.5–4.6)

## 2018-11-25 MED ORDER — ORAL CARE MOUTH RINSE
15.0000 mL | Freq: Two times a day (BID) | OROMUCOSAL | Status: DC
  Administered 2018-11-26 (×2): 15 mL via OROMUCOSAL

## 2018-11-25 MED ORDER — CHLORHEXIDINE GLUCONATE 0.12 % MT SOLN
15.0000 mL | Freq: Two times a day (BID) | OROMUCOSAL | Status: DC
  Administered 2018-11-26 – 2018-11-27 (×3): 15 mL via OROMUCOSAL
  Filled 2018-11-25 (×2): qty 15

## 2018-11-25 MED ORDER — VITAL AF 1.2 CAL PO LIQD
1000.0000 mL | ORAL | Status: DC
  Administered 2018-11-25 – 2018-11-26 (×2): 1000 mL
  Filled 2018-11-25: qty 1000

## 2018-11-25 MED ORDER — POTASSIUM CHLORIDE 20 MEQ/15ML (10%) PO SOLN
40.0000 meq | Freq: Once | ORAL | Status: AC
  Administered 2018-11-25: 40 meq
  Filled 2018-11-25: qty 30

## 2018-11-25 NOTE — Progress Notes (Addendum)
PROGRESS NOTE    Courtney Terry  HYQ:[id number] DOB: 1961/01/18 DOA: 11/08/2018 PCP: [name], MD   Brief Narrative: Courtney Terry is a 58 y.o. female with a history of COPD, hypothyroidism, chronic hypotension, tobacco use. She presented with respiratory failure and required intubation. Eventually tracheostomy performed. NG tube in place for enteral feeding secondary to dysphagia.   Assessment & Plan:   Principal Problem:   Acute and chronic respiratory failure with hypercapnia (HCC) Active Problems:   History of breast cancer   Schizophrenia (HCC)   COPD (chronic obstructive pulmonary disease) (HCC)   Cocaine abuse ([place])   Hypothyroidism   Hypertension   Epilepsy ([place])   Tobacco use disorder   Acute respiratory failure with hypoxia and hypercapnia (HCC)   Tracheostomy status ([place])   Acute on chronic respiratory failure COPD exacerbation Patient required intubation from 2/2 until 2/13 requiring tracheostomy on 2/13. Empirically treated with ceftriaxone and azithromycin in addition to treatment for COPD exacerbation. Weaned to trach collar currently on 5L of oxygen. -Continue oxygen/trach collar -Pulmonology recommendations -Continue Brovana, Pulmicort, Duonebs  Hypothyroidism -Continue Synthroid  Severe malnutrition Per dietitian assessment. NG tube placed (Cortrak) on 2/14. TF held secondary to concern for ileus. -Resume tube feeds  Dysphagia Currently NPO. SLP following. Plan for reevaluation prior to advancing diet.  Ileus Patient started on NG w/ suction. Bowel sounds present and patient with bowel movements -Clamp NG, then remove -Restart tube feeds  Chronic hypotension Weaned off of midodrine while in ICU. Was on midodrine and Florinef as an outpatient. Currently not on Florinef. Blood pressure well controlled currently.  Tracheostomy -Per pulmonology  History of seizure disorder Patient was on Depakote as an outpatient one year ago.  Currently not on home medication list.   DVT prophylaxis: Heparin subq Code Status:   Code Status: Full Code Family Communication: None. Family sleeping at bedside Disposition Plan: Discharge pending transition to PO. Recommendation for CIR on discharge.   Consultants:   PCCM  Procedures:   ETT (2/2>>2/13)  Tracheostomy (2/13)  NG tube (2/14)  Antimicrobials:  Ceftriaxone  Azithromycin  Tamiflu    Subjective: No pain or concerns overnight. Wanting to eat.  Objective: Vitals:   11/25/18 0600 11/25/18 0700 11/25/18 0750 11/25/18 0800  BP:    (!) 136/97  Pulse: 96 67  69  Resp: 11 15  16   Temp:   98.2 F (36.8 C)   TempSrc:   Oral   SpO2: 95% 97%  96%  Weight:      Height:        Intake/Output Summary (Last 24 hours) at 11/25/2018 0906 Last data filed at 11/25/2018 0800 Gross per 24 hour  Intake 864.11 ml  Output 2800 ml  Net -1935.89 ml   Filed Weights   11/23/18 0500 11/24/18 0500 11/25/18 0500  Weight: 54.1 kg 51.5 kg 49.1 kg    Examination:  General exam: Appears calm and comfortable Respiratory system: Clear to auscultation. Respiratory effort normal. Tracheostomy Cardiovascular system: S1 & S2 heard, RRR. No murmurs, rubs, gallops or clicks. Gastrointestinal system: Abdomen is nondistended, soft and nontender. No organomegaly or masses felt. Normal bowel sounds heard. Central nervous system: Alert. No focal neurological deficits. Extremities: No edema. No calf tenderness Skin: No cyanosis. No rashes Psychiatry: Judgement and insight appear normal. Mood & affect appropriate.     Data Reviewed: I have personally reviewed following labs and imaging studies  CBC: Recent Labs  Lab 11/21/18 0348 11/22/18 0430 11/23/18 0304 11/24/18 0430 11/25/18  0448  WBC 14.9* 18.1* 16.1* 15.2* 13.5*  NEUTROABS  --  13.9* 12.3* 12.7* 11.2*  HGB 10.9* 10.1* 9.7* 9.6* 10.6*  HCT 36.1 33.6* 31.9* 30.8* 34.5*  MCV 88.7 89.6 88.6 88.3 88.5  PLT 232 212 205  200 633   Basic Metabolic Panel: Recent Labs  Lab 11/21/18 0348 11/22/18 0031 11/22/18 0430 11/23/18 0304 11/24/18 0430 11/25/18 0448  NA 138 140  --  140 137 138  K 3.7 3.6  --  3.7 3.4* 2.9*  CL 102 102  --  100 98 97*  CO2 28 33*  --  31 29 30   GLUCOSE 172* 102*  --  76 86 83  BUN 17 14  --  12 10 8   CREATININE 0.41* 0.33*  --  0.33* 0.35* 0.36*  CALCIUM 8.8* 8.6*  --  9.2 8.9 8.9  MG 2.2  --  1.8 1.8 1.7 1.7  PHOS 4.7*  --  3.6 3.2 2.9 3.5   GFR: Estimated Creatinine Clearance: 60.9 mL/min (A) (by C-G formula based on SCr of 0.36 mg/dL (L)). Liver Function Tests: Recent Labs  Lab 11/20/18 0444 11/22/18 0430  AST 16 21  ALT 16 17  ALKPHOS 40 51  BILITOT 0.6 0.6  PROT 5.2* 6.0*  ALBUMIN 2.5* 2.8*   No results for input(s): LIPASE, AMYLASE in the last 168 hours. No results for input(s): AMMONIA in the last 168 hours. Coagulation Profile: Recent Labs  Lab 11/19/18 2301  INR 1.02   Cardiac Enzymes: No results for input(s): CKTOTAL, CKMB, CKMBINDEX, TROPONINI in the last 168 hours. BNP (last 3 results) No results for input(s): PROBNP in the last 8760 hours. HbA1C: No results for input(s): HGBA1C in the last 72 hours. CBG: Recent Labs  Lab 11/24/18 1525 11/24/18 1945 11/25/18 0006 11/25/18 0412 11/25/18 0749  GLUCAP 96 76 81 77 96   Lipid Profile: No results for input(s): CHOL, HDL, LDLCALC, TRIG, CHOLHDL, LDLDIRECT in the last 72 hours. Thyroid Function Tests: No results for input(s): TSH, T4TOTAL, FREET4, T3FREE, THYROIDAB in the last 72 hours. Anemia Panel: No results for input(s): VITAMINB12, FOLATE, FERRITIN, TIBC, IRON, RETICCTPCT in the last 72 hours. Sepsis Labs: No results for input(s): PROCALCITON, LATICACIDVEN in the last 168 hours.  No results found for this or any previous visit (from the past 240 hour(s)).       Radiology Studies: Dg Abd 1 View  Result Date: 11/23/2018 CLINICAL DATA:  Vomiting.  Abdominal distension. EXAM:  ABDOMEN - 1 VIEW COMPARISON:  11/20/2018 and 11/19/2018 FINDINGS: There is diffuse increased bowel gas. Small bowel is dilated. No colonic distention. Stomach is moderately distended. No gross soft tissue abnormality. Enteric tube lies within the stomach. IMPRESSION: 1. Diffusely increased bowel gas with small bowel dilation. This may reflect an adynamic ileus. Partial distal small-bowel obstruction is possible. Electronically Signed   By: Lajean Manes M.D.   On: 11/23/2018 10:01        Scheduled Meds: . arformoterol  15 mcg Nebulization BID  . budesonide (PULMICORT) nebulizer solution  0.5 mg Nebulization BID  . chlorhexidine gluconate (MEDLINE KIT)  15 mL Mouth Rinse BID  . Chlorhexidine Gluconate Cloth  6 each Topical Daily  . FLUoxetine  40 mg Per Tube Daily  . folic acid  1 mg Intravenous Daily  . heparin  5,000 Units Subcutaneous Q8H  . insulin aspart  0-9 Units Subcutaneous Q4H  . ipratropium-albuterol  3 mL Nebulization Q6H  . levothyroxine  75 mcg Oral Q0600  .  mouth rinse  15 mL Mouth Rinse 10 times per day  . methylPREDNISolone (SOLU-MEDROL) injection  30 mg Intravenous Daily  . potassium chloride  40 mEq Per Tube Once  . sodium chloride flush  10-40 mL Intracatheter Q12H  . thiamine  100 mg Per Tube Daily   Continuous Infusions: . sodium chloride Stopped (11/24/18 0634)  . dextrose 20 mL/hr at 11/25/18 0800  . famotidine (PEPCID) IV Stopped (11/24/18 2140)  . feeding supplement (VITAL AF 1.2 CAL)    . valproate sodium Stopped (11/25/18 0720)     LOS: 17 days     Cordelia Poche, MD [facility] 11/25/2018, 9:06 AM  If 7PM-7AM, please contact night-coverage [email or web address]

## 2018-11-25 NOTE — Progress Notes (Signed)
Physical Therapy Treatment Patient Details Name: Courtney Terry MRN: [id number] DOB: March 13, 1961 Today's Date: 11/25/2018    History of Present Illness Pt is a 59 year old woman admitted on 11/08/18 with acute on chronic hypoxic/hypercarbic respiratory failure/trach (being treated as severe COPD exacerbation) with acute encepholopathy, and moderate protein calorie malnutrition (on tube feeds).  Intubated 2/2-2/13, trach on 2/13 and remains on ventilator with trach collar trials. Hospital course complicated by agitation, ileus (with NGT to suction). PMH: COPD on 2L home 02, bipolar, PTSD, HTN, cocaine abuse, breast CA s/p mastectomy and multiple bil reconstructions.     PT Comments    Patient with poor attention and requiring cues for dynamic mobility. demonstrates weakness standing with hands on assistance required for pivot to chari with premature sit, max A at this time. Cont to rec CIR.     Follow Up Recommendations  CIR     Equipment Recommendations  Rolling walker with 5" wheels;3in1 (PT);Wheelchair (measurements PT);Wheelchair cushion (measurements PT)    Recommendations for Other Services Rehab consult     Precautions / Restrictions Precautions Precautions: Fall Precaution Comments: trach collar, NGT, coretrack feeding tube Restrictions Weight Bearing Restrictions: No    Mobility  Bed Mobility Overal bed mobility: Needs Assistance Bed Mobility: Supine to Sit     Supine to sit: Min guard Sit to supine: Mod assist;HOB elevated   General bed mobility comments: step by step cues, increased time, min guard for safety  Transfers Overall transfer level: Needs assistance   Transfers: Sit to/from Stand;Stand Pivot Transfers Sit to Stand: Mod assist;+2 safety/equipment Stand pivot transfers: +2 safety/equipment;Max assist Squat pivot transfers: +2 physical assistance;Max assist     General transfer comment: face to face technique, assist to rise and steady as she took  pivotal steps to recliner, attempts to sit prematurely   Ambulation/Gait                 Stairs             Wheelchair Mobility    Modified Rankin (Stroke Patients Only)       Balance Overall balance assessment: Needs assistance Sitting-balance support: Feet supported;Bilateral upper extremity supported Sitting balance-Leahy Scale: Fair Sitting balance - Comments: intermittent posterior lean, but self corrects Postural control: Posterior lean Standing balance support: Bilateral upper extremity supported Standing balance-Leahy Scale: Poor Standing balance comment: requires max assist                            Cognition Arousal/Alertness: Awake/alert Behavior During Therapy: Impulsive Overall Cognitive Status: Impaired/Different from baseline Area of Impairment: Attention;Memory;Safety/judgement;Following commands;Awareness;Problem solving                   Current Attention Level: Focused Memory: Decreased short-term memory;Decreased recall of precautions Following Commands: Follows one step commands with increased time Safety/Judgement: Decreased awareness of safety;Decreased awareness of deficits Awareness: Intellectual Problem Solving: Slow processing;Decreased initiation;Difficulty sequencing;Requires verbal cues;Requires tactile cues General Comments: less restless, no agitation, somewhat perseverative      Exercises      General Comments        Pertinent Vitals/Pain Pain Assessment: Faces Faces Pain Scale: No hurt    Home Living                      Prior Function            PT Goals (current goals can now be found in the care  plan section) Acute Rehab PT Goals Patient Stated Goal: unable to state, agreeable with head nod to participate. PT Goal Formulation: With patient Time For Goal Achievement: 12/04/18 Potential to Achieve Goals: Good Progress towards PT goals: Progressing toward goals     Frequency    Min 3X/week      PT Plan Current plan remains appropriate    Co-evaluation PT/OT/SLP Co-Evaluation/Treatment: Yes Reason for Co-Treatment: Complexity of the patient's impairments (multi-system involvement)   OT goals addressed during session: ADL's and self-care      AM-PAC PT "6 Clicks" Mobility   Outcome Measure  Help needed turning from your back to your side while in a flat bed without using bedrails?: A Lot Help needed moving from lying on your back to sitting on the side of a flat bed without using bedrails?: A Lot Help needed moving to and from a bed to a chair (including a wheelchair)?: A Lot Help needed standing up from a chair using your arms (e.g., wheelchair or bedside chair)?: Total Help needed to walk in hospital room?: Total Help needed climbing 3-5 steps with a railing? : Total 6 Click Score: 9    End of Session Equipment Utilized During Treatment: Oxygen;Other (comment)(TC 28%) Activity Tolerance: Patient limited by fatigue Patient left: in chair;with chair alarm set;with family/visitor present;with call bell/phone within reach Nurse Communication: Mobility status PT Visit Diagnosis: Unsteadiness on feet (R26.81)     Time: 0931-1216 PT Time Calculation (min) (ACUTE ONLY): 23 min  Charges:  $Therapeutic Activity: 8-22 mins                    Reinaldo Berber, PT, DPT Acute Rehabilitation Services Pager: [phone] Office: [phone]     Reinaldo Berber 11/25/2018, 2:12 PM

## 2018-11-25 NOTE — Progress Notes (Signed)
Nutrition Follow-up  DOCUMENTATION CODES:   Severe malnutrition in context of chronic illness, Underweight  INTERVENTION:   Recommend resume TF:  Vital AF 1.2 increase by 10 ml every 4 hours to goal rate of 60 ml/h (1440 ml per day)  Provides 1728 kcal, 108 gm protein, 1168 ml free water daily  NUTRITION DIAGNOSIS:   Severe Malnutrition related to chronic illness(COPD) as evidenced by severe fat depletion, severe muscle depletion.  Ongoing  GOAL:   Patient will meet greater than or equal to 90% of their needs  Being addressed with TF  MONITOR:   TF tolerance, Labs, Skin  ASSESSMENT:   58 yo female with PMH of asthma, Bipolar D/O, PTSD, COPD, anemia, schizophrenia, hypothyroidism, HTN, seizures, breast cancer, cocaine abuse who was admitted with COPD exacerbation, VDRF.  TF off since 2/17 due to emesis.  Abdominal film 2/17 showed adynamic ileus, possible SBO.  Abdominal film today showed no ileus or obstruction. Feeding tube tip in the stomach.  Cortrak tube to be advanced some today and TF being resumed at 30 ml/h.   Patient is currently on trach collar.  Labs reviewed. Potassium 2.9 (L) Medications reviewed.  Weight is trending back down.  Admit wt 45.4 kg Net I/O +9.5 L since admission.  Diet Order:   Diet Order            Diet NPO time specified  Diet effective now              EDUCATION NEEDS:   No education needs have been identified at this time  Skin:  Skin Assessment: Skin Integrity Issues: Skin Integrity Issues:: Unstageable Unstageable: sacrum  Last BM:  2/19  Height:   Ht Readings from Last 1 Encounters:  11/08/18 5\' 10"  (1.778 m)    Weight:   Wt Readings from Last 1 Encounters:  11/25/18 49.1 kg    Ideal Body Weight:  68.2 kg  BMI:  Body mass index is 15.53 kg/m.  Estimated Nutritional Needs:   Kcal:  1650-1850  Protein:  85-100 gm  Fluid:  >/= 1.6 L    Molli Barrows, RD, LDN, [place] Pager [phone] After  Hours Pager [phone]

## 2018-11-25 NOTE — Progress Notes (Addendum)
Occupational Therapy Treatment Patient Details Name: Courtney Terry MRN: [id number] DOB: 03-17-1961 Today's Date: 11/25/2018    History of present illness Pt is a 58 year old woman admitted on 11/08/18 with acute on chronic hypoxic/hypercarbic respiratory failure/trach (being treated as severe COPD exacerbation) with acute encepholopathy, and moderate protein calorie malnutrition (on tube feeds).  Intubated 2/2-2/13, trach on 2/13 and remains on ventilator with trach collar trials. Hospital course complicated by agitation, ileus (with NGT to suction). PMH: COPD on 2L home 02, bipolar, PTSD, HTN, cocaine abuse, breast CA s/p mastectomy and multiple bil reconstructions.    OT comments  Pt without agitation this date, but continues to demonstrate impaired attention and need step by step cues to sequence bed mobility. Pt able to stand fully upright this visit with assist of 1, second for lines and safety to transfer to chair. Tends to sit impulsively. Pt on 35% trach collar with stable VS.  Follow Up Recommendations  CIR;Supervision/Assistance - 24 hour    Equipment Recommendations       Recommendations for Other Services      Precautions / Restrictions Precautions Precautions: Fall Precaution Comments: trach collar, NGT, coretrack feeding tube       Mobility Bed Mobility Overal bed mobility: Needs Assistance Bed Mobility: Supine to Sit     Supine to sit: Min guard     General bed mobility comments: step by step cues, increased time, min guard for safety  Transfers Overall transfer level: Needs assistance   Transfers: Sit to/from Stand;Stand Pivot Transfers Sit to Stand: Mod assist;+2 safety/equipment Stand pivot transfers: +2 safety/equipment;Max assist       General transfer comment: face to face technique, assist to rise and steady as she took pivotal steps to recliner, attempts to sit prematurely     Balance Overall balance assessment: Needs assistance   Sitting  balance-Leahy Scale: Fair Sitting balance - Comments: intermittent posterior lean, but self corrects     Standing balance-Leahy Scale: Poor Standing balance comment: requires max assist                           ADL either performed or assessed with clinical judgement   ADL Overall ADL's : Needs assistance/impaired                 Upper Body Dressing : Maximal assistance;Sitting Upper Body Dressing Details (indicate cue type and reason): front opening gown Lower Body Dressing: Maximal assistance;Bed level       Toileting- Clothing Manipulation and Hygiene: Total assistance;Sit to/from stand;+2 for physical assistance         General ADL Comments: Pt remains NPO.     Vision       Perception     Praxis      Cognition Arousal/Alertness: Awake/alert Behavior During Therapy:Impulsive Overall Cognitive Status: Impaired/Different from baseline Area of Impairment: Attention;Memory;Safety/judgement;Following commands;Awareness;Problem solving                   Current Attention Level: Focused   Following Commands: Follows one step commands with increased time Safety/Judgement: Decreased awareness of safety;Decreased awareness of deficits Awareness: Intellectual Problem Solving: Slow processing;Decreased initiation;Difficulty sequencing;Requires verbal cues;Requires tactile cues General Comments: less restless, no agitation, somewhat perseverative        Exercises     Shoulder Instructions       General Comments      Pertinent Vitals/ Pain       Pain Assessment: Faces  Faces Pain Scale: No hurt  Home Living                                          Prior Functioning/Environment              Frequency  Min 2X/week        Progress Toward Goals  OT Goals(current goals can now be found in the care plan section)  Progress towards OT goals: Progressing toward goals  Acute Rehab OT Goals Patient Stated Goal:  unable to state, agreeable with head nod to participate. OT Goal Formulation: Patient unable to participate in goal setting Time For Goal Achievement: 12/04/18 Potential to Achieve Goals: Good  Plan Discharge plan remains appropriate    Co-evaluation    PT/OT/SLP Co-Evaluation/Treatment: Yes Reason for Co-Treatment: Complexity of the patient's impairments (multi-system involvement);For patient/therapist safety   OT goals addressed during session: ADL's and self-care      AM-PAC OT "6 Clicks" Daily Activity     Outcome Measure   Help from another person eating meals?: Total Help from another person taking care of personal grooming?: A Lot Help from another person toileting, which includes using toliet, bedpan, or urinal?: Total Help from another person bathing (including washing, rinsing, drying)?: Total Help from another person to put on and taking off regular upper body clothing?: A Lot Help from another person to put on and taking off regular lower body clothing?: Total 6 Click Score: 8    End of Session Equipment Utilized During Treatment: Gait belt;Oxygen  OT Visit Diagnosis: Unsteadiness on feet (R26.81);Other symptoms and signs involving cognitive function;Muscle weakness (generalized) (M62.81)   Activity Tolerance Patient tolerated treatment well   Patient Left in chair;with call bell/phone within reach;with chair alarm set;with family/visitor present   Nurse Communication          Time: 6147-0929 OT Time Calculation (min): 23 min  Charges: OT General Charges $OT Visit: 1 Visit OT Treatments $Therapeutic Activity: 8-22 mins  Nestor Lewandowsky, OTR/L Acute Rehabilitation Services Pager: [phone] Office: [phone]   Malka So 11/25/2018, 11:23 AM

## 2018-11-26 DIAGNOSIS — R5381 Other malaise: Secondary | ICD-10-CM

## 2018-11-26 DIAGNOSIS — G9341 Metabolic encephalopathy: Secondary | ICD-10-CM

## 2018-11-26 LAB — GLUCOSE, CAPILLARY
GLUCOSE-CAPILLARY: 98 mg/dL (ref 70–99)
Glucose-Capillary: 102 mg/dL — ABNORMAL HIGH (ref 70–99)
Glucose-Capillary: 111 mg/dL — ABNORMAL HIGH (ref 70–99)
Glucose-Capillary: 121 mg/dL — ABNORMAL HIGH (ref 70–99)
Glucose-Capillary: 102 mg/dL — ABNORMAL HIGH (ref 70–99)

## 2018-11-26 LAB — BASIC METABOLIC PANEL
Anion gap: 14 (ref 5–15)
BUN: 7 mg/dL (ref 6–20)
CO2: 26 mmol/L (ref 22–32)
Calcium: 9.1 mg/dL (ref 8.9–10.3)
Chloride: 98 mmol/L (ref 98–111)
Creatinine, Ser: 0.36 mg/dL — ABNORMAL LOW (ref 0.44–1.00)
GFR calc Af Amer: >60 mL/min (ref >60)
Glucose, Bld: 99 mg/dL (ref 70–99)
Potassium: 3.5 mmol/L (ref 3.5–5.1)
Sodium: 138 mmol/L (ref 135–145)

## 2018-11-26 LAB — PHOSPHORUS: Phosphorus: 3.1 mg/dL (ref 2.5–4.6)

## 2018-11-26 LAB — MAGNESIUM: Magnesium: 1.7 mg/dL (ref 1.7–2.4)

## 2018-11-26 MED ORDER — PREDNISONE 10 MG PO TABS
10.0000 mg | ORAL_TABLET | Freq: Every day | ORAL | Status: DC
  Administered 2018-11-26 – 2018-11-27 (×2): 10 mg
  Filled 2018-11-26 (×2): qty 1

## 2018-11-26 MED ORDER — LEVOTHYROXINE SODIUM 75 MCG PO TABS
75.0000 ug | ORAL_TABLET | Freq: Every day | ORAL | Status: DC
  Administered 2018-11-27: 75 ug
  Filled 2018-11-26: qty 1

## 2018-11-26 MED ORDER — CLONAZEPAM 0.5 MG PO TBDP
0.5000 mg | ORAL_TABLET | Freq: Three times a day (TID) | ORAL | Status: DC | PRN
  Administered 2018-11-26: 0.5 mg
  Filled 2018-11-26: qty 1

## 2018-11-26 MED ORDER — FAMOTIDINE 40 MG/5ML PO SUSR
20.0000 mg | Freq: Two times a day (BID) | ORAL | Status: DC
  Administered 2018-11-26 – 2018-11-27 (×2): 20 mg
  Filled 2018-11-26 (×3): qty 2.5

## 2018-11-26 MED ORDER — FOLIC ACID 1 MG PO TABS
1.0000 mg | ORAL_TABLET | Freq: Every day | ORAL | Status: DC
  Administered 2018-11-27: 1 mg
  Filled 2018-11-26: qty 1

## 2018-11-26 MED ORDER — WHITE PETROLATUM EX OINT
TOPICAL_OINTMENT | CUTANEOUS | Status: AC
  Administered 2018-11-26: 18:00:00
  Filled 2018-11-26: qty 28.35

## 2018-11-26 NOTE — Progress Notes (Signed)
Report called to [place], RN on [facility].  Pt to be transferred to 2W22 via bed.  VSS  Remains on .28 trach collar and tolerating well.  Pt attempts to get OOB at times. Sitter order obtained and this was reported to [place], Therapist, sports.
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
## 2018-11-26 NOTE — Progress Notes (Signed)
Pt arrived on unit via bed.  Call light within reach.  Skin assessment completed with Hoyle Sauer, RN agree with day shift RN assessment.  Cortrack in place with tube feeding running at 45/hr.  Trach collar at 28% oxygen.  Pt is alert in room and bed alarm set.  Will continue to monitor.

## 2018-11-26 NOTE — Progress Notes (Signed)
PROGRESS NOTE    Courtney Terry  AJO:[id number] DOB: August 20, 1961 DOA: 11/08/2018 PCP: [name], MD   Brief Narrative: Courtney Terry is a 58 y.o. female with a history of COPD, hypothyroidism, chronic hypotension, tobacco use. She presented with respiratory failure and required intubation. Eventually tracheostomy performed. NG tube in place for enteral feeding secondary to dysphagia.   Assessment & Plan:   Principal Problem:   Acute and chronic respiratory failure with hypercapnia (HCC) Active Problems:   History of breast cancer   Schizophrenia (HCC)   COPD (chronic obstructive pulmonary disease) (HCC)   Cocaine abuse ([place])   Hypothyroidism   Hypertension   Epilepsy ([place])   Tobacco use disorder   Acute respiratory failure with hypoxia and hypercapnia (HCC)   Tracheostomy status ([place])   Acute on chronic respiratory failure COPD exacerbation Patient required intubation from 2/2 until 2/13 requiring tracheostomy on 2/13. Empirically treated with ceftriaxone and azithromycin in addition to treatment for COPD exacerbation. Weaned to trach collar currently on 5L of oxygen. -Continue oxygen/trach collar -Pulmonology recommendations -Continue Brovana, Pulmicort, Duonebs -Switch to prednisone per tube -PT/OT/SLP recommending CIR -Goal SpO2 >92%  Hypothyroidism -Continue Synthroid  Severe malnutrition Per dietitian assessment. NG tube placed (Cortrak) on 2/14. TF held secondary to concern for ileus. -Continue tube feeds, goal rate of 60 ml/hr  Dysphagia Currently NPO. SLP following. Plan for reevaluation prior to advancing diet. -Re-consult SLP  Ileus Patient started on NG w/ suction. Bowel sounds present and patient with bowel movements. Resolved.  Chronic hypotension Weaned off of midodrine while in ICU. Was on midodrine and Florinef as an outpatient. Currently not on Florinef. Blood pressure well controlled currently.  Tracheostomy -Per pulmonology -SLP  for [facility] valve  History of seizure disorder Patient was on Depakote as an outpatient one year ago. Currently not on home medication list.   DVT prophylaxis: Heparin subq Code Status:   Code Status: Full Code Family Communication: None. Family sleeping at bedside Disposition Plan: Discharge pending transition to PO. Recommendation for CIR on discharge.   Consultants:   PCCM  Procedures:   ETT (2/2>>2/13)  Tracheostomy (2/13)  NG tube (2/14)  Antimicrobials:  Ceftriaxone  Azithromycin  Tamiflu    Subjective: Per nursing, patient with frequent attempts to get out of bed secondary to need for urination. Redirected unsuccessfully multiple times. Patient has a purewick. Per patient, no issues overnight.  Objective: Vitals:   11/26/18 0500 11/26/18 0600 11/26/18 0700 11/26/18 0754  BP: (!) 148/96 (!) 140/91 (!) 149/95   Pulse: 91 87    Resp: 14 12 (!) 22   Temp:    98.8 F (37.1 C)  TempSrc:    Oral  SpO2: 98% 96%    Weight: 46.9 kg     Height:        Intake/Output Summary (Last 24 hours) at 11/26/2018 0824 Last data filed at 11/26/2018 0700 Gross per 24 hour  Intake 1083.81 ml  Output 1450 ml  Net -366.19 ml   Filed Weights   11/24/18 0500 11/25/18 0500 11/26/18 0500  Weight: 51.5 kg 49.1 kg 46.9 kg    Examination:  General exam: Appears calm and comfortable Respiratory system: Rhonchi bilaterally. Respiratory effort normal. Tracheostomy Cardiovascular system: S1 & S2 heard, RRR. No murmurs, rubs, gallops or clicks. Gastrointestinal system: Abdomen is nondistended, soft and nontender. No organomegaly or masses felt. Normal bowel sounds heard. Central nervous system: Alert. No focal neurological deficits. Extremities: No edema. No calf tenderness. Right knee with visible  hardware under skin Skin: No cyanosis. No rashes Psychiatry: Judgement and insight appear normal. Mood & affect appropriate.   Data Reviewed: I have personally reviewed following  labs and imaging studies  CBC: Recent Labs  Lab 11/21/18 0348 11/22/18 0430 11/23/18 0304 11/24/18 0430 11/25/18 0448  WBC 14.9* 18.1* 16.1* 15.2* 13.5*  NEUTROABS  --  13.9* 12.3* 12.7* 11.2*  HGB 10.9* 10.1* 9.7* 9.6* 10.6*  HCT 36.1 33.6* 31.9* 30.8* 34.5*  MCV 88.7 89.6 88.6 88.3 88.5  PLT 232 212 205 200 790   Basic Metabolic Panel: Recent Labs  Lab 11/21/18 0348 11/22/18 0031 11/22/18 0430 11/23/18 0304 11/24/18 0430 11/25/18 0448  NA 138 140  --  140 137 138  K 3.7 3.6  --  3.7 3.4* 2.9*  CL 102 102  --  100 98 97*  CO2 28 33*  --  31 29 30   GLUCOSE 172* 102*  --  76 86 83  BUN 17 14  --  12 10 8   CREATININE 0.41* 0.33*  --  0.33* 0.35* 0.36*  CALCIUM 8.8* 8.6*  --  9.2 8.9 8.9  MG 2.2  --  1.8 1.8 1.7 1.7  PHOS 4.7*  --  3.6 3.2 2.9 3.5   GFR: Estimated Creatinine Clearance: 58.1 mL/min (A) (by C-G formula based on SCr of 0.36 mg/dL (L)). Liver Function Tests: Recent Labs  Lab 11/20/18 0444 11/22/18 0430  AST 16 21  ALT 16 17  ALKPHOS 40 51  BILITOT 0.6 0.6  PROT 5.2* 6.0*  ALBUMIN 2.5* 2.8*   No results for input(s): LIPASE, AMYLASE in the last 168 hours. No results for input(s): AMMONIA in the last 168 hours. Coagulation Profile: Recent Labs  Lab 11/19/18 2301  INR 1.02   Cardiac Enzymes: No results for input(s): CKTOTAL, CKMB, CKMBINDEX, TROPONINI in the last 168 hours. BNP (last 3 results) No results for input(s): PROBNP in the last 8760 hours. HbA1C: No results for input(s): HGBA1C in the last 72 hours. CBG: Recent Labs  Lab 11/25/18 1533 11/25/18 1946 11/25/18 2329 11/26/18 0343 11/26/18 0752  GLUCAP 101* 107* 98 98 102*   Lipid Profile: No results for input(s): CHOL, HDL, LDLCALC, TRIG, CHOLHDL, LDLDIRECT in the last 72 hours. Thyroid Function Tests: No results for input(s): TSH, T4TOTAL, FREET4, T3FREE, THYROIDAB in the last 72 hours. Anemia Panel: No results for input(s): VITAMINB12, FOLATE, FERRITIN, TIBC, IRON,  RETICCTPCT in the last 72 hours. Sepsis Labs: No results for input(s): PROCALCITON, LATICACIDVEN in the last 168 hours.  No results found for this or any previous visit (from the past 240 hour(s)).       Radiology Studies: Dg Abd Portable 1v  Result Date: 11/25/2018 CLINICAL DATA:  Ileus. EXAM: PORTABLE ABDOMEN - 1 VIEW COMPARISON:  Radiograph of November 23, 2018. FINDINGS: Distal tip of nasogastric tube is seen in expected position of distal stomach or proximal duodenum. Distal tip of feeding tube is seen in distal stomach. No abnormal bowel dilatation is noted. This is significantly improved compared to prior exam. No abnormal calcifications noted IMPRESSION: No definite evidence of bowel obstruction or ileus. Distal tip of nasogastric tube seen in either distal stomach or proximal duodenum. Distal tip of feeding tube seen in distal stomach. Electronically Signed   By: Marijo Conception, M.D.   On: 11/25/2018 11:28        Scheduled Meds: . arformoterol  15 mcg Nebulization BID  . budesonide (PULMICORT) nebulizer solution  0.5 mg Nebulization BID  .  chlorhexidine  15 mL Mouth Rinse BID  . Chlorhexidine Gluconate Cloth  6 each Topical Daily  . FLUoxetine  40 mg Per Tube Daily  . folic acid  1 mg Intravenous Daily  . heparin  5,000 Units Subcutaneous Q8H  . insulin aspart  0-9 Units Subcutaneous Q4H  . ipratropium-albuterol  3 mL Nebulization Q6H  . levothyroxine  75 mcg Oral Q0600  . mouth rinse  15 mL Mouth Rinse q12n4p  . predniSONE  10 mg Per Tube Q breakfast  . sodium chloride flush  10-40 mL Intracatheter Q12H  . thiamine  100 mg Per Tube Daily   Continuous Infusions: . sodium chloride Stopped (11/24/18 0634)  . famotidine (PEPCID) IV Stopped (11/25/18 2134)  . feeding supplement (VITAL AF 1.2 CAL) 1,000 mL (11/25/18 1233)  . valproate sodium 250 mg (11/26/18 0558)     LOS: 18 days     Cordelia Poche, MD [facility] 11/26/2018, 8:24 AM  If 7PM-7AM, please  contact night-coverage [email or web address]
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
## 2018-11-26 NOTE — Clinical Social Work Note (Signed)
Clinical Social Work Assessment  Patient Details  Name: Courtney Terry MRN: [id number] Date of Birth: 02/14/1961  Date of referral:  11/26/18               Reason for consult:  Facility Placement                Permission sought to share information with:  Family Supports Permission granted to share information::  Yes, Verbal Permission Granted  Name::     Sisters (listed in chart)   Agency::  family  Relationship::  sisters  Contact Information:  Sister (listed in chart)   Housing/Transportation Living arrangements for the past 2 months:  Single Family Home(with roommate) Source of Information:  Patient Patient Interpreter Needed:  None Criminal Activity/Legal Involvement Pertinent to Current Situation/Hospitalization:  No - Comment as needed Significant Relationships:  Siblings Lives with:  Self, Roommate Do you feel safe going back to the place where you live?  Yes Need for family participation in patient care:  Yes (Comment)  Care giving concerns:  CSW consulted as pt is being recommended for CIR. CSW spoke with pt at bedside to discuss this with pt.   Social Worker assessment / plan:  CSW spoke with pt at bedside. CSW introduced self and role in pt's care. Pt was very receptive and willing to speak with CSW at best. CSW was informed that pt is from home with roommate. Pt was unable to tell CSW roommates name at this time but was able to expressed that she has support from roommate as well as sisters and brother. CSW spoke with pt about PT recommendation at this time. CSW explained to pt that pt is being looked at for CIR which is inpt rehab here at the hospital. CSW advised pt that CSW always does SNF work up as a back up to CIR in the event that CIR is unable to take pt due to needs.   Pt appeared to be very understanding and agreeable to having CSW work pt up for SNF placement as a back up to CIR.   Employment status:  Other (Comment)(unknown at this time. ) Insurance  information:  Medicaid [place] PT Recommendations:  [place] / Referral to community resources:  [place]  Patient/Family's Response to care:  Pt's response to care appeared to be understanding and agreeable at this time. During this assessment pt sat up at bedside to speak with CSW.   Patient/Family's Understanding of and Emotional Response to Diagnosis, Current Treatment, and Prognosis:  At this time no further questions or concerns have been  presented to CSW.   Emotional Assessment Appearance:  Appears stated age Attitude/Demeanor/Rapport:  Engaged Affect (typically observed):  Pleasant, Accepting, Appropriate Orientation:  Oriented to  Time, Oriented to Place, Oriented to Self, Oriented to Situation Alcohol / Substance use:  Not Applicable Psych involvement (Current and /or in the community):  No (Comment)  Discharge Needs  Concerns to be addressed:  Lack of Support, Care Coordination Readmission within the last 30 days:  No Current discharge risk:  Dependent with Mobility, Lack of support system Barriers to Discharge:  Continued Medical Work up   [employer], [place] 11/26/2018, 10:15 AM

## 2018-11-26 NOTE — Progress Notes (Signed)
IP rehab admissions - I met with patient and her two sisters at the bedside.  Sisters are open to patient coming to inpatient rehab but feel patient will need SNF placement after CIR due to inadequate care and supervision at home.  I will discuss possible inpatient rehab admission with rehab MD in am and then will follow up.  Call me for questions.  [phone]

## 2018-11-27 ENCOUNTER — Other Ambulatory Visit: Payer: Self-pay

## 2018-11-27 ENCOUNTER — Inpatient Hospital Stay (HOSPITAL_COMMUNITY)
Admission: RE | Admit: 2018-11-27 | Discharge: 2018-12-24 | DRG: 945 | Disposition: A | Discharge status: Home / Self Care | Payer: Medicaid Other | Source: Intra-hospital | Attending: Physical Medicine & Rehabilitation | Admitting: Physical Medicine & Rehabilitation

## 2018-11-27 ENCOUNTER — Encounter (HOSPITAL_COMMUNITY): Payer: Self-pay | Admitting: *Deleted

## 2018-11-27 DIAGNOSIS — Z803 Family history of malignant neoplasm of breast: Secondary | ICD-10-CM

## 2018-11-27 DIAGNOSIS — G40909 Epilepsy, unspecified, not intractable, without status epilepticus: Secondary | ICD-10-CM | POA: Diagnosis present

## 2018-11-27 DIAGNOSIS — Z430 Encounter for attention to tracheostomy: Secondary | ICD-10-CM

## 2018-11-27 DIAGNOSIS — C50411 Malignant neoplasm of upper-outer quadrant of right female breast: Secondary | ICD-10-CM

## 2018-11-27 DIAGNOSIS — F17210 Nicotine dependence, cigarettes, uncomplicated: Secondary | ICD-10-CM | POA: Diagnosis present

## 2018-11-27 DIAGNOSIS — Z9981 Dependence on supplemental oxygen: Secondary | ICD-10-CM

## 2018-11-27 DIAGNOSIS — Z79899 Other long term (current) drug therapy: Secondary | ICD-10-CM

## 2018-11-27 DIAGNOSIS — R1310 Dysphagia, unspecified: Secondary | ICD-10-CM | POA: Diagnosis present

## 2018-11-27 DIAGNOSIS — Z79890 Hormone replacement therapy (postmenopausal): Secondary | ICD-10-CM

## 2018-11-27 DIAGNOSIS — R5381 Other malaise: Secondary | ICD-10-CM | POA: Diagnosis present

## 2018-11-27 DIAGNOSIS — Z930 Tracheostomy status: Secondary | ICD-10-CM | POA: Diagnosis not present

## 2018-11-27 DIAGNOSIS — G9341 Metabolic encephalopathy: Secondary | ICD-10-CM | POA: Diagnosis not present

## 2018-11-27 DIAGNOSIS — J9611 Chronic respiratory failure with hypoxia: Secondary | ICD-10-CM | POA: Diagnosis present

## 2018-11-27 DIAGNOSIS — Z853 Personal history of malignant neoplasm of breast: Secondary | ICD-10-CM

## 2018-11-27 DIAGNOSIS — F1910 Other psychoactive substance abuse, uncomplicated: Secondary | ICD-10-CM | POA: Diagnosis not present

## 2018-11-27 DIAGNOSIS — J9610 Chronic respiratory failure, unspecified whether with hypoxia or hypercapnia: Secondary | ICD-10-CM | POA: Diagnosis present

## 2018-11-27 DIAGNOSIS — Z7952 Long term (current) use of systemic steroids: Secondary | ICD-10-CM | POA: Diagnosis not present

## 2018-11-27 DIAGNOSIS — J9601 Acute respiratory failure with hypoxia: Secondary | ICD-10-CM | POA: Diagnosis not present

## 2018-11-27 DIAGNOSIS — J441 Chronic obstructive pulmonary disease with (acute) exacerbation: Secondary | ICD-10-CM | POA: Diagnosis not present

## 2018-11-27 DIAGNOSIS — R509 Fever, unspecified: Secondary | ICD-10-CM

## 2018-11-27 DIAGNOSIS — R1312 Dysphagia, oropharyngeal phase: Secondary | ICD-10-CM | POA: Diagnosis not present

## 2018-11-27 DIAGNOSIS — F4310 Post-traumatic stress disorder, unspecified: Secondary | ICD-10-CM | POA: Diagnosis present

## 2018-11-27 DIAGNOSIS — F1410 Cocaine abuse, uncomplicated: Secondary | ICD-10-CM | POA: Diagnosis present

## 2018-11-27 DIAGNOSIS — F209 Schizophrenia, unspecified: Secondary | ICD-10-CM | POA: Diagnosis present

## 2018-11-27 DIAGNOSIS — I950 Idiopathic hypotension: Secondary | ICD-10-CM | POA: Diagnosis not present

## 2018-11-27 DIAGNOSIS — I9589 Other hypotension: Secondary | ICD-10-CM | POA: Diagnosis present

## 2018-11-27 DIAGNOSIS — I10 Essential (primary) hypertension: Secondary | ICD-10-CM | POA: Diagnosis present

## 2018-11-27 DIAGNOSIS — R7309 Other abnormal glucose: Secondary | ICD-10-CM

## 2018-11-27 DIAGNOSIS — E039 Hypothyroidism, unspecified: Secondary | ICD-10-CM | POA: Diagnosis present

## 2018-11-27 DIAGNOSIS — L89159 Pressure ulcer of sacral region, unspecified stage: Secondary | ICD-10-CM | POA: Diagnosis present

## 2018-11-27 DIAGNOSIS — F419 Anxiety disorder, unspecified: Secondary | ICD-10-CM | POA: Diagnosis present

## 2018-11-27 DIAGNOSIS — Z79811 Long term (current) use of aromatase inhibitors: Secondary | ICD-10-CM | POA: Diagnosis not present

## 2018-11-27 DIAGNOSIS — R0902 Hypoxemia: Secondary | ICD-10-CM

## 2018-11-27 DIAGNOSIS — J449 Chronic obstructive pulmonary disease, unspecified: Secondary | ICD-10-CM | POA: Diagnosis present

## 2018-11-27 DIAGNOSIS — F3170 Bipolar disorder, currently in remission, most recent episode unspecified: Secondary | ICD-10-CM | POA: Diagnosis present

## 2018-11-27 DIAGNOSIS — D72829 Elevated white blood cell count, unspecified: Secondary | ICD-10-CM

## 2018-11-27 DIAGNOSIS — Z818 Family history of other mental and behavioral disorders: Secondary | ICD-10-CM

## 2018-11-27 DIAGNOSIS — D62 Acute posthemorrhagic anemia: Secondary | ICD-10-CM | POA: Diagnosis present

## 2018-11-27 LAB — CBC
HCT: 34.1 % — ABNORMAL LOW (ref 36.0–46.0)
Hemoglobin: 10.5 g/dL — ABNORMAL LOW (ref 12.0–15.0)
MCH: 27.3 pg (ref 26.0–34.0)
MCHC: 30.8 g/dL (ref 30.0–36.0)
MCV: 88.6 fL (ref 80.0–100.0)
Platelets: 207 10*3/uL (ref 150–400)
RBC: 3.85 MIL/uL — ABNORMAL LOW (ref 3.87–5.11)
RDW: 14.9 % (ref 11.5–15.5)
WBC: 12.8 10*3/uL — ABNORMAL HIGH (ref 4.0–10.5)
nRBC: 0 % (ref 0.0–0.2)

## 2018-11-27 LAB — MAGNESIUM: Magnesium: 1.6 mg/dL — ABNORMAL LOW (ref 1.7–2.4)

## 2018-11-27 LAB — GLUCOSE, CAPILLARY
GLUCOSE-CAPILLARY: 120 mg/dL — AB (ref 70–99)
GLUCOSE-CAPILLARY: 133 mg/dL — AB (ref 70–99)
Glucose-Capillary: 94 mg/dL (ref 70–99)
Glucose-Capillary: 149 mg/dL — ABNORMAL HIGH (ref 70–99)
Glucose-Capillary: 103 mg/dL — ABNORMAL HIGH (ref 70–99)
Glucose-Capillary: 77 mg/dL (ref 70–99)

## 2018-11-27 LAB — CREATININE, SERUM
Creatinine, Ser: 0.42 mg/dL — ABNORMAL LOW (ref 0.44–1.00)
GFR calc Af Amer: >60 mL/min (ref >60)
GFR calc non Af Amer: >60 mL/min (ref >60)

## 2018-11-27 LAB — PHOSPHORUS: PHOSPHORUS: 3.7 mg/dL (ref 2.5–4.6)

## 2018-11-27 MED ORDER — CHLORHEXIDINE GLUCONATE 0.12 % MT SOLN
15.0000 mL | Freq: Two times a day (BID) | OROMUCOSAL | Status: DC
  Administered 2018-11-27 – 2018-12-13 (×24): 15 mL via OROMUCOSAL
  Filled 2018-11-27 (×33): qty 15

## 2018-11-27 MED ORDER — FAMOTIDINE 40 MG/5ML PO SUSR
20.0000 mg | Freq: Two times a day (BID) | ORAL | Status: DC
  Administered 2018-11-27 – 2018-12-06 (×11): 20 mg
  Filled 2018-11-27 (×21): qty 2.5

## 2018-11-27 MED ORDER — POLYETHYLENE GLYCOL 3350 17 G PO PACK: 17.0000 g | PACK | Freq: Every day | ORAL | 0 refills | Status: DC | PRN

## 2018-11-27 MED ORDER — ARFORMOTEROL TARTRATE 15 MCG/2ML IN NEBU
15.0000 ug | INHALATION_SOLUTION | Freq: Two times a day (BID) | RESPIRATORY_TRACT | Status: DC
  Administered 2018-11-27 – 2018-12-24 (×51): 15 ug via RESPIRATORY_TRACT
  Filled 2018-11-27 (×51): qty 2

## 2018-11-27 MED ORDER — CLONAZEPAM 0.5 MG PO TBDP
0.5000 mg | ORAL_TABLET | Freq: Three times a day (TID) | ORAL | Status: DC | PRN
  Administered 2018-11-30 – 2018-12-09 (×7): 0.5 mg
  Filled 2018-11-27 (×9): qty 1

## 2018-11-27 MED ORDER — FOLIC ACID 1 MG PO TABS
1.0000 mg | ORAL_TABLET | Freq: Every day | ORAL | Status: DC
  Administered 2018-11-28 – 2018-12-07 (×8): 1 mg
  Filled 2018-11-27 (×11): qty 1

## 2018-11-27 MED ORDER — INSULIN ASPART 100 UNIT/ML ~~LOC~~ SOLN
0.0000 [IU] | SUBCUTANEOUS | Status: DC
  Administered 2018-11-28 – 2018-11-29 (×2): 1 [IU] via SUBCUTANEOUS

## 2018-11-27 MED ORDER — DIVALPROEX SODIUM 250 MG PO DR TAB: 250.0000 mg | DELAYED_RELEASE_TABLET | Freq: Three times a day (TID) | ORAL | Status: DC

## 2018-11-27 MED ORDER — BISACODYL 10 MG RE SUPP: 10.0000 mg | Freq: Every day | RECTAL | Status: DC | PRN

## 2018-11-27 MED ORDER — HEPARIN SODIUM (PORCINE) 5000 UNIT/ML IJ SOLN
5000.0000 [IU] | Freq: Three times a day (TID) | INTRAMUSCULAR | Status: DC
  Administered 2018-11-27 – 2018-12-01 (×11): 5000 [IU] via SUBCUTANEOUS
  Filled 2018-11-27 (×11): qty 1

## 2018-11-27 MED ORDER — ALBUTEROL SULFATE (2.5 MG/3ML) 0.083% IN NEBU
2.5000 mg | INHALATION_SOLUTION | RESPIRATORY_TRACT | Status: DC | PRN
  Administered 2018-12-01 – 2018-12-14 (×7): 2.5 mg via RESPIRATORY_TRACT
  Filled 2018-11-27 (×8): qty 3

## 2018-11-27 MED ORDER — HEPARIN SODIUM (PORCINE) 5000 UNIT/ML IJ SOLN: 5000.0000 [IU] | Freq: Three times a day (TID) | INTRAMUSCULAR | Status: DC

## 2018-11-27 MED ORDER — IPRATROPIUM-ALBUTEROL 0.5-2.5 (3) MG/3ML IN SOLN
3.0000 mL | Freq: Three times a day (TID) | RESPIRATORY_TRACT | Status: DC
  Administered 2018-11-27 – 2018-11-29 (×5): 3 mL via RESPIRATORY_TRACT
  Filled 2018-11-27 (×5): qty 3

## 2018-11-27 MED ORDER — THIAMINE HCL 100 MG PO TABS: 100.0000 mg | ORAL_TABLET | Freq: Every day | ORAL | Status: DC

## 2018-11-27 MED ORDER — POLYETHYLENE GLYCOL 3350 17 G PO PACK: 17.0000 g | PACK | Freq: Every day | ORAL | Status: DC | PRN

## 2018-11-27 MED ORDER — FLUOXETINE HCL 40 MG PO CAPS: 40.0000 mg | ORAL_CAPSULE | Freq: Every day | ORAL | 3 refills | Status: DC

## 2018-11-27 MED ORDER — FOLIC ACID 1 MG PO TABS: 1.0000 mg | ORAL_TABLET | Freq: Every day | ORAL | Status: DC

## 2018-11-27 MED ORDER — DIVALPROEX SODIUM ER 250 MG PO TB24
250.0000 mg | ORAL_TABLET | Freq: Three times a day (TID) | ORAL | Status: DC
  Administered 2018-11-27 – 2018-12-15 (×45): 250 mg via ORAL
  Filled 2018-11-27 (×65): qty 1

## 2018-11-27 MED ORDER — PREDNISONE 5 MG PO TABS
10.0000 mg | ORAL_TABLET | Freq: Every day | ORAL | Status: DC
  Administered 2018-11-28 – 2018-12-07 (×9): 10 mg
  Filled 2018-11-27 (×12): qty 2

## 2018-11-27 MED ORDER — VITAMIN B-1 100 MG PO TABS
100.0000 mg | ORAL_TABLET | Freq: Every day | ORAL | Status: DC
  Administered 2018-11-28 – 2018-12-07 (×7): 100 mg
  Filled 2018-11-27 (×11): qty 1

## 2018-11-27 MED ORDER — FLUOXETINE HCL 20 MG PO CAPS
40.0000 mg | ORAL_CAPSULE | Freq: Every day | ORAL | Status: DC
  Administered 2018-11-28 – 2018-12-07 (×5): 40 mg
  Filled 2018-11-27 (×12): qty 2

## 2018-11-27 MED ORDER — BUDESONIDE 0.5 MG/2ML IN SUSP
0.5000 mg | Freq: Two times a day (BID) | RESPIRATORY_TRACT | Status: DC
  Administered 2018-11-27 – 2018-12-24 (×52): 0.5 mg via RESPIRATORY_TRACT
  Filled 2018-11-27 (×54): qty 2

## 2018-11-27 MED ORDER — LEVOTHYROXINE SODIUM 75 MCG PO TABS
75.0000 ug | ORAL_TABLET | Freq: Every day | ORAL | Status: DC
  Administered 2018-11-28 – 2018-12-08 (×8): 75 ug
  Filled 2018-11-27 (×11): qty 1

## 2018-11-27 MED ORDER — ORAL CARE MOUTH RINSE
15.0000 mL | Freq: Two times a day (BID) | OROMUCOSAL | Status: DC
  Administered 2018-11-28 – 2018-12-17 (×8): 15 mL via OROMUCOSAL

## 2018-11-27 MED ORDER — IPRATROPIUM-ALBUTEROL 0.5-2.5 (3) MG/3ML IN SOLN
3.0000 mL | Freq: Three times a day (TID) | RESPIRATORY_TRACT | Status: DC
  Administered 2018-11-27: 3 mL via RESPIRATORY_TRACT
  Filled 2018-11-27 (×2): qty 3

## 2018-11-27 NOTE — Progress Notes (Signed)
  Speech Language Pathology Treatment: Dysphagia  Patient Details Name: Courtney Terry MRN: [id number] DOB: Mar 01, 1961 Today's Date: 11/27/2018 Time: 0488-8916 SLP Time Calculation (min) (ACUTE ONLY): 24 min  Assessment / Plan / Recommendation Clinical Impression  Pt demonstrates excellent tolerance of recommended dys 2 ground textures and nectar thick liquids without PMSV in place. Consumed 50% of meal. Trials of thin liquids result in slightly delayed coughing, consistent with FEES trials of thin when trace aspiration observed. Pt ready for d/c of NG tube as oral intake has been consistent for 24 hours. Trialed PMSV though there is little improvement in redirection or air to upper airway. Pt will need 4 cuffless trach for improved phonatory ability. Will also facilitate oral expectoration of secretions. Pt observed to have a strong cough to expectorate frothy clear/white runny secretions from trach hub. Pt with excellent participation today. Will continue to follow for communication/dysphagia therapy.   HPI HPI: Pt is a 58 yo female admitted with COPD exacerbation requiring intubation (2/2) and eventual tracheostomy (2/13). Hospital course complicated by agitation, ileus. PMH includes severe COPD on 2L home 02, bipolar, PTSD, HTN, cocaine abuse, breast CA s/p mastectomy and multiple bil reconstructions.       SLP Plan  Continue with current plan of care       Recommendations  Diet recommendations: Dysphagia 2 (fine chop);Nectar-thick liquid Liquids provided via: Cup;No straw Medication Administration: Whole meds with puree Supervision: Staff to assist with self feeding;Full supervision/cueing for compensatory strategies Compensations: Slow rate;Small sips/bites Postural Changes and/or Swallow Maneuvers: Seated upright 90 degrees      Patient may use Passy-Muir Speech Valve: with SLP only PMSV Supervision: Full MD: Please consider changing trach tube to : Smaller size;Cuffless          General recommendations: Rehab consult Oral Care Recommendations: Oral care BID Follow up Recommendations: Inpatient Rehab SLP Visit Diagnosis: Dysphagia, oropharyngeal phase (R13.12) Plan: Continue with current plan of care       GO               [name], MA [place] Pager [phone] Office [phone]  [name] 11/27/2018, 2:11 PM

## 2018-11-27 NOTE — Progress Notes (Signed)
Occupational Therapy Progress Note  Pt seen with PT.  Pt was able to maintain EOB sitting x 10 mins with min guard assist, she was able to perform bed mobility with mod A, sit to stand with mod A +2, functional transfers with max A +2.  She requires max A for LB ADLs - she was able to pull  Sock over Lt heel with max effort today.   02 sats decreased to 86% on RA, and remained >92% on 28% Fi02 via trach collar.  She is making steady progress with therapies.   Recommend CIR.    11/27/18 1100  OT Visit Information  Last OT Received On 11/27/18  PT/OT/SLP Co-Evaluation/Treatment Yes  Reason for Co-Treatment Complexity of the patient's impairments (multi-system involvement);Necessary to address cognition/behavior during functional activity;For patient/therapist safety;To address functional/ADL transfers  OT goals addressed during session ADL's and self-care  History of Present Illness Pt is a 58 year old woman admitted on 11/08/18 with acute on chronic hypoxic/hypercarbic respiratory failure/trach (being treated as severe COPD exacerbation) with acute encepholopathy, and moderate protein calorie malnutrition (on tube feeds).  Intubated 2/2-2/13, trach on 2/13 and remains on ventilator with trach collar trials. Hospital course complicated by agitation, ileus (with NGT to suction). PMH: COPD on 2L home 02, bipolar, PTSD, HTN, cocaine abuse, breast CA s/p mastectomy and multiple bil reconstructions.   Precautions  Precautions Fall  Precaution Comments trach collar, NGT, coretrack feeding tube  Pain Assessment  Pain Assessment No/denies pain  Cognition  Arousal/Alertness Awake/alert  Behavior During Therapy Flat affect  Overall Cognitive Status Impaired/Different from baseline  Area of Impairment Attention;Following commands;Problem solving  Current Attention Level Sustained  Following Commands Follows one step commands consistently;Follows multi-step commands inconsistently  Problem Solving Slow  processing;Decreased initiation;Requires verbal cues  General Comments cognition difficult to accurately assess due to trach collar.  Pt is slow to initiate activity, it is difficulty to ascertain with certainty if she looses attention to activity/command  or if she is just fatigued   Difficult to assess due to Tracheostomy  Upper Extremity Assessment  Upper Extremity Assessment Generalized weakness  Lower Extremity Assessment  Lower Extremity Assessment Defer to PT evaluation  ADL  Overall ADL's  Needs assistance/impaired  Lower Body Dressing Maximal assistance  Lower Body Dressing Details (indicate cue type and reason) pt able to pull socks over heels with mod A sitting EOB   Toilet Transfer Stand-pivot;BSC;Maximal assistance  Toilet Transfer Details (indicate cue type and reason) assist to stand and for balance   Toileting - Clothing Manipulation Details (indicate cue type and reason) Pt incontinent of stool, once transferred to recliner, but adamantly refused to allow PT/OT to assist her with clean up.  She requested that her nurse and CNA perform this.   Functional mobility during ADLs +2 for safety/equipment;Maximal assistance  Bed Mobility  Overal bed mobility Needs Assistance  Bed Mobility Supine to Sit  Supine to sit Min guard  Sit to supine Mod assist;HOB elevated  General bed mobility comments step by step cues, increased time, min guard for safety  Balance  Overall balance assessment Needs assistance  Sitting-balance support Feet supported;Bilateral upper extremity supported  Sitting balance-Leahy Scale Fair  Standing balance support Bilateral upper extremity supported  Standing balance-Leahy Scale Poor  Transfers  Overall transfer level Needs assistance  Equipment used None  Transfers Sit to/from Stand;Stand Pivot Transfers  Sit to Stand Mod assist;+2 safety/equipment  Stand pivot transfers Max assist;+2 safety/equipment  General transfer comment standing x3 trials,  pivot to chair. patient remained 90-92% Spo2 on TC 28%  OT - End of Session  Equipment Utilized During Treatment Gait belt;Oxygen  Activity Tolerance Patient tolerated treatment well  Patient left in chair;with call bell/phone within reach  Nurse Communication Mobility status  OT Assessment/Plan  OT Plan Discharge plan remains appropriate  OT Visit Diagnosis Unsteadiness on feet (R26.81);Other symptoms and signs involving cognitive function;Muscle weakness (generalized) (M62.81)  OT Frequency (ACUTE ONLY) Min 2X/week  Follow Up Recommendations CIR;Supervision/Assistance - 24 hour  AM-PAC OT "6 Clicks" Daily Activity Outcome Measure (Version 2)  Help from another person eating meals? 1  Help from another person taking care of personal grooming? 2  Help from another person toileting, which includes using toliet, bedpan, or urinal? 2  Help from another person bathing (including washing, rinsing, drying)? 2  Help from another person to put on and taking off regular upper body clothing? 2  Help from another person to put on and taking off regular lower body clothing? 1  6 Click Score 10  OT Goal Progression  Progress towards OT goals Progressing toward goals  OT Time Calculation  OT Start Time (ACUTE ONLY) 1033  OT Stop Time (ACUTE ONLY) 1112  OT Time Calculation (min) 39 min  OT General Charges  $OT Visit 1 Visit  OT Treatments  $Self Care/Home Management  8-22 mins  Lucille Passy, OTR/L Acute Rehabilitation Services Pager [phone] Office [phone]
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
## 2018-11-27 NOTE — Progress Notes (Signed)
NAME:  Courtney Terry, MRN:  [id number], DOB:  Feb 28, 1961, LOS: 57 ADMISSION DATE:  11/08/2018, CONSULTATION DATE:  11/08/18 REFERRING MD:  Karmen Bongo, MD, CHIEF COMPLAINT:  Respiratory failure    Brief History   58 year old female with COPD (FEV1 25%), chronic hypoxemic respiratory failure, with recent hospitalization in December for COPD exacerbation who presents with SOB and AMS. Found to have acute on chronic hypoxemic and hypercarbic respiratory failure requiring intubation. Initially admitted by Hospitalist. PCCM consulted for admission to the intensive care unit after being intubated in the emergency room on 2/2.  UDS positive for cocaine. Of note she has a past medical history of severe COPD FEV1 25% predicted (spirometry 10/2016). CTA completed in the emergency room negative for pulmonary embolism with advanced evidence of COPD.   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 HITT negative.  2/16 Sedate, gtt's being weaned off, on ATC during day but then required MV for resp distress  Consults:  PCCM   Procedures:  ETT 2/2 >> 2/13 Trach 2/13 >> R PICC 2/13 >>  Significant Diagnostic Tests:  CTA 2/2: Neg for PE, emphysema, multiple pulmonary nodules, ETT in place CT head w/o contrast 2/2: stable/normal EEG 2/3: moderate global encephalopathy,  No epileptiform activities  UDS 2/2 > cocaine  Micro Data:  Resp PCR 2/2 >> negative  UC 2/2 >> 100k corynebacterium  Sputum 2/2 >> normal flora  BCx2 2/2 >> negative  Cervical Swab 2/14 >> trichomonas   Antimicrobials:  Azithromycin 2/2 >> 2/6 Ceftriaxone 2/3 >>2/7 Flagyl 2/14 >>off  Interim history/subjective:  Tolerating t collar. Still with thick  secretions   Objective   Blood pressure 128/90, pulse 88, temperature 98.2 F (36.8 C), temperature source Oral, resp. rate 18, height 5\' 10"  (1.778 m), weight 44.3 kg, SpO2 96 %.    FiO2 (%):  [28 %] 28 %   Intake/Output Summary (Last 24 hours) at 11/27/2018 1215 Last data filed at 11/27/2018 0900 Gross per 24 hour  Intake 1152.5 ml  Output 175 ml  Net 977.5 ml   Filed Weights   11/25/18 0500 11/26/18 0500 11/27/18 0640  Weight: 49.1 kg 46.9 kg 44.3 kg   EXAM: General:  Frail thin female HEENT: trach intact Neuro: weak but intact CV: s1s2 rrr, no m/r/g PULM: coarse rhonchi bilat MW:NUUV, non-tender, bsx4 active  Extremities: warm/drynegedema  Skin: no rashes or lesions   LABS   PULMONARY No results for input(s): PHART, PCO2ART, PO2ART, HCO3, TCO2, O2SAT in the last 168 hours.  Invalid input(s): PCO2, PO2  CBC Recent Labs  Lab 11/23/18 0304 11/24/18 0430 11/25/18 0448  HGB 9.7* 9.6* 10.6*  HCT 31.9* 30.8* 34.5*  WBC 16.1* 15.2* 13.5*  PLT 205 200 226    COAGULATION No results for input(s): INR in the last 168 hours.  CARDIAC  No results for input(s): TROPONINI in the last 168 hours. No results for input(s): PROBNP in the last 168 hours.   CHEMISTRY Recent Labs  Lab 11/22/18 0031  11/23/18 0304 11/24/18 0430 11/25/18 0448 11/26/18 0650 11/27/18 0650  NA 140  --  140 137 138 138  --   K 3.6  --  3.7 3.4* 2.9* 3.5  --   CL 102  --  100  98 97* 98  --   CO2 33*  --  31 29 30 26   --   GLUCOSE 102*  --  76 86 83 99  --   BUN 14  --  12 10 8 7   --   CREATININE 0.33*  --  0.33* 0.35* 0.36* 0.36*  --   CALCIUM 8.6*  --  9.2 8.9 8.9 9.1  --   MG  --    < > 1.8 1.7 1.7 1.7 1.6*  PHOS  --    < > 3.2 2.9 3.5 3.1 3.7   < > = values in this interval not displayed.   Estimated Creatinine Clearance: 54.9 mL/min (A) (by C-G formula based on SCr of 0.36 mg/dL (L)).   LIVER Recent Labs  Lab 11/22/18 0430  AST 21  ALT 17  ALKPHOS 51  BILITOT 0.6  PROT  6.0*  ALBUMIN 2.8*    INFECTIOUS No results for input(s): LATICACIDVEN, PROCALCITON in the last 168 hours.   ENDOCRINE CBG (last 3)  Recent Labs    11/27/18 0050 11/27/18 0350 11/27/18 0752  GLUCAP 133* 120* 94     IMAGING   No results found.  Resolved Hospital Problem list   VDRF  Assessment & Plan:   Acute on Chronic Hypoxemic and Hypercarbic Respiratory Failure in setting of severe COPD (FEV1 25%), s/p tracheostomy   P: Cont trach collar for now 2nd to thick secretions Follow once weekly for ? Decannulation in future.   Best practice:  Diet: adult tube feed protocol  Pain/Anxiety/Delirium protocol (if indicated): prn  VAP protocol (if indicated): Yes DVT prophylaxis: SQ heparin, SCDs GI prophylaxis: famotidine  Glucose control: SSI Mobility: OOB Code Status: Full  Family Communication: friend asleep at bedside  Disposition: progressive care      Richardson Landry Minor ACNP Maryanna Shape PCCM Pager [phone] till 1 pm If no answer page [phone] 11/27/2018, 12:15 PM
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
## 2018-11-27 NOTE — Progress Notes (Signed)
Physical Therapy Treatment Patient Details Name: Courtney Terry MRN: [id number] DOB: 05-May-1961 Today's Date: 11/27/2018    History of Present Illness Pt is a 58 year old woman admitted on 11/08/18 with acute on chronic hypoxic/hypercarbic respiratory failure/trach (being treated as severe COPD exacerbation) with acute encepholopathy, and moderate protein calorie malnutrition (on tube feeds).  Intubated 2/2-2/13, trach on 2/13 and remains on ventilator with trach collar trials. Hospital course complicated by agitation, ileus (with NGT to suction). PMH: COPD on 2L home 02, bipolar, PTSD, HTN, cocaine abuse, breast CA s/p mastectomy and multiple bil reconstructions.     PT Comments    Patient with mild progression this visit, tolerating slightly more activity with multiple sit to stand and transfer to bedside chair. satting well on TC 28%. Cont to have decreased tolerance and slow processing, will attempt to progress to gait next session rec close chair follow. HR 95-105 throughout session.      Follow Up Recommendations  CIR     Equipment Recommendations  Rolling walker with 5" wheels;3in1 (PT);Wheelchair (measurements PT);Wheelchair cushion (measurements PT)    Recommendations for Other Services Rehab consult     Precautions / Restrictions Precautions Precautions: Fall Precaution Comments: trach collar, NGT, coretrack feeding tube Restrictions Weight Bearing Restrictions: No    Mobility  Bed Mobility Overal bed mobility: Needs Assistance Bed Mobility: Supine to Sit     Supine to sit: Min guard Sit to supine: Mod assist;HOB elevated   General bed mobility comments: step by step cues, increased time, min guard for safety  Transfers Overall transfer level: Needs assistance Equipment used: None Transfers: Sit to/from [facility] Sit to Stand: Mod assist;+2 safety/equipment Stand pivot transfers: Max assist;+2 safety/equipment       General transfer  comment: standing x3 trials, pivot to chair. patient remained 90-92% Spo2 on TC 28%  Ambulation/Gait             General Gait Details: deferred due to fatigue    Stairs             Wheelchair Mobility    Modified Rankin (Stroke Patients Only)       Balance Overall balance assessment: Needs assistance Sitting-balance support: Feet supported;Bilateral upper extremity supported Sitting balance-Leahy Scale: Fair     Standing balance support: Bilateral upper extremity supported Standing balance-Leahy Scale: Poor                              Cognition Arousal/Alertness: Awake/alert Behavior During Therapy: Flat affect Overall Cognitive Status: Impaired/Different from baseline Area of Impairment: Attention;Following commands;Problem solving                   Current Attention Level: Sustained Memory: Decreased short-term memory;Decreased recall of precautions Following Commands: Follows one step commands consistently;Follows multi-step commands inconsistently Safety/Judgement: Decreased awareness of safety;Decreased awareness of deficits Awareness: Intellectual Problem Solving: Slow processing;Decreased initiation;Requires verbal cues General Comments: cognition difficult to accurately assess due to trach collar.  Pt is slow to initiate activity, it is difficulty to ascertain with certainty if she looses attention to activity/command  or if she is just fatigued       Exercises      General Comments        Pertinent Vitals/Pain Pain Assessment: No/denies pain    Home Living                      Prior Function  PT Goals (current goals can now be found in the care plan section) Acute Rehab PT Goals PT Goal Formulation: With patient Time For Goal Achievement: 12/04/18 Potential to Achieve Goals: Good Progress towards PT goals: Progressing toward goals    Frequency    Min 3X/week      PT Plan Current plan  remains appropriate    Co-evaluation PT/OT/SLP Co-Evaluation/Treatment: Yes Reason for Co-Treatment: Complexity of the patient's impairments (multi-system involvement);For patient/therapist safety PT goals addressed during session: Mobility/safety with mobility;Balance;Proper use of DME;Strengthening/ROM OT goals addressed during session: ADL's and self-care      AM-PAC PT "6 Clicks" Mobility   Outcome Measure  Help needed turning from your back to your side while in a flat bed without using bedrails?: A Lot Help needed moving from lying on your back to sitting on the side of a flat bed without using bedrails?: A Lot Help needed moving to and from a bed to a chair (including a wheelchair)?: A Lot Help needed standing up from a chair using your arms (e.g., wheelchair or bedside chair)?: Total Help needed to walk in hospital room?: Total Help needed climbing 3-5 steps with a railing? : Total 6 Click Score: 9    End of Session Equipment Utilized During Treatment: Oxygen;Other (comment) Activity Tolerance: Patient limited by fatigue Patient left: in chair;with chair alarm set;with family/visitor present;with call bell/phone within reach Nurse Communication: Mobility status PT Visit Diagnosis: Unsteadiness on feet (R26.81)     Time: 2878-6767 PT Time Calculation (min) (ACUTE ONLY): 42 min  Charges:  $Therapeutic Activity: 23-37 mins                     Reinaldo Berber, PT, DPT Acute Rehabilitation Services Pager: [phone] Office: [phone]     Reinaldo Berber 11/27/2018, 11:53 AM
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
## 2018-11-27 NOTE — Progress Notes (Signed)
Patient ID: Courtney Terry, female   DOB: 09/17/1961, 58 y.o.   MRN: [id number] Admit to unit via bed. Oriented to unit, plan of care, rehab routine and medications. States an understanding of information reviewed. Margarito Liner
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
## 2018-11-27 NOTE — Discharge Summary (Signed)
Physician Discharge Summary  Courtney Terry MVH:[id number] DOB: 1961/09/20 DOA: 11/08/2018  PCP: [name], MD  Admit date: 11/08/2018 Discharge date: 11/27/2018  Admitted From: Home Disposition: CIR  Recommendations for Outpatient Follow-up:  1. Continued pulmonology follow-up for tracheostomy care and possible decannulation  2. Please obtain BMP/CBC in one week 3. Please follow up on the following pending results: None  Home Health: CIR Equipment/Devices: Rolling walker, 3 in 1 on discharge from CIR  Discharge Condition: Stable CODE STATUS: Full code Diet recommendation:  SLP Diet Recommendations Nectar thick liquid;Dysphagia 2 (Fine chop) solids  Liquid Administration via Straw;Cup  Medication Administration Whole meds with puree  Compensations Slow rate;Small sips/bites  Postural Changes Seated upright at 90 degrees      Brief/Interim Summary:  Admission HPI written by Garner Nash, DO   History of present illness  58 year old female with PMHx below who presents with worsening dyspnea and altered mental status. EMS was called and she was treated with bag mask ventilation, nebulizers, steroids and magnesium with improvement.  Patient was subsequently found to be hypercarbic and hypoxemic with a depressed mental status in the emergency room.  The decision was made for endotracheal intubation.  Patient was found to have a urinary drug screen positive for cocaine.  Of note she has a past medical history of severe COPD FEV1 25% predicted.  CTA completed in the emergency room was negative for pulmonary embolism.  Showed advanced evidence of COPD.   Hospital course:  Acute on chronic respiratory failure COPD exacerbation Patient required intubation from 2/2 until 2/13 requiring tracheostomy on 2/13. Empirically treated with ceftriaxone and azithromycin in addition to treatment for COPD exacerbation. Weaned to trach collar currently on 5L of oxygen. Continued  pulmonology follow-up and management. Continue home COPD medications. Continue prednisone 10 mg daily.  Hypothyroidism Continue Synthroid  Severe malnutrition Per dietitian assessment. NG tube placed (Cortrak) on 2/14. TF held secondary to concern for ileus and restarted once resolved. Now on oral diet with protein supplementation.  Dysphagia Initially required an NG tube and tube feeds. Advanced to a dysphagia 2 diet. NG tube discontinued prior to discharge.  Ileus Patient started on NG w/ suction. Bowel sounds present and patient with bowel movements. Resolved.  Chronic hypotension Weaned off of midodrine while in ICU. Was on midodrine and Florinef as an outpatient. Currently not on Florinef. Blood pressure well controlled currently.  Tracheostomy Per pulmonology. Currently 6 mm cuffed. Continued assessments for possible decannulation. Continue oxygen.  History of seizure disorder Patient was on Depakote as an outpatient one year ago. Currently not on home medication list. Restarted in the hospital. Continue on discharge.  Discharge Diagnoses:  Principal Problem:   Acute and chronic respiratory failure with hypercapnia (HCC) Active Problems:   History of breast cancer   Schizophrenia (HCC)   COPD (chronic obstructive pulmonary disease) (HCC)   Cocaine abuse ([place])   Hypothyroidism   Hypertension   Epilepsy ([place])   Tobacco use disorder   Acute respiratory failure with hypoxia and hypercapnia ([place])   Tracheostomy status ([place])    Discharge Instructions   Allergies as of 11/27/2018      Reactions   Levaquin [levofloxacin] Hives      Medication List    STOP taking these medications   midodrine 10 MG tablet Commonly known as:  PROAMATINE   OXYGEN     TAKE these medications   albuterol (2.5 MG/3ML) 0.083% nebulizer solution Commonly known as:  PROVENTIL Take  3 mLs (2.5 mg total) by nebulization every 4 (four) hours as needed for wheezing or shortness of  breath. What changed:  Another medication with the same name was changed. Make sure you understand how and when to take each.   albuterol 108 (90 Base) MCG/ACT inhaler Commonly known as:  PROAIR HFA INHALE TWO PUFFS INTO THE LUNGS EVERY 4 HOURS AS NEEDED FOR WHEEZING OR SHORTNESS OF BREATH What changed:    how much to take  how to take this  when to take this  reasons to take this  additional instructions   anastrozole 1 MG tablet Commonly known as:  ARIMIDEX TAKE ONE TABLET BY MOUTH DAILY   budesonide 0.5 MG/2ML nebulizer solution Commonly known as:  PULMICORT Take 0.5 mg by nebulization 2 (two) times daily.   divalproex 250 MG DR tablet Commonly known as:  DEPAKOTE Take 1 tablet (250 mg total) by mouth 3 (three) times daily.   famotidine 20 MG tablet Commonly known as:  PEPCID One at bedtime What changed:    how much to take  how to take this  when to take this  additional instructions   feeding supplement (ENSURE ENLIVE) Liqd Take 237 mLs by mouth 3 (three) times daily between meals. What changed:    when to take this  reasons to take this   fludrocortisone 0.1 MG tablet Commonly known as:  FLORINEF Take 1 tablet (0.1 mg total) by mouth daily.   FLUoxetine 40 MG capsule Commonly known as:  PROZAC Take 1 capsule (40 mg total) by mouth daily. Start taking on:  November 28, 6501   folic acid 1 MG tablet Commonly known as:  FOLVITE Place 1 tablet (1 mg total) into feeding tube daily. Start taking on:  November 28, 2018   Glycopyrrolate-Formoterol 9-4.8 MCG/ACT Aero Commonly known as:  BEVESPI AEROSPHERE Inhale 2 puffs into the lungs 2 (two) times daily. What changed:  Another medication with the same name was removed. Continue taking this medication, and follow the directions you see here.   levothyroxine 75 MCG tablet Commonly known as:  SYNTHROID, LEVOTHROID Take 1 tablet (75 mcg total) daily by mouth.   multivitamin with minerals Tabs  tablet Take 1 tablet by mouth daily.   omeprazole 40 MG capsule Commonly known as:  PRILOSEC Take 30-60 min before first meal of the day What changed:    how much to take  how to take this  when to take this  additional instructions   polyethylene glycol packet Commonly known as:  MIRALAX / GLYCOLAX Take 17 g by mouth daily as needed for mild constipation.   predniSONE 10 MG tablet Commonly known as:  DELTASONE Take 1 tablet (10 mg total) by mouth daily with breakfast.   thiamine 100 MG tablet Take 1 tablet (100 mg total) by mouth daily. Start taking on:  November 28, 2018       Allergies  Allergen Reactions  . Levaquin [Levofloxacin] Hives    Consultations:  Critical care medicine/pulmonology   Procedures/Studies: Dg Abd 1 View  Result Date: 11/23/2018 CLINICAL DATA:  Vomiting.  Abdominal distension. EXAM: ABDOMEN - 1 VIEW COMPARISON:  11/20/2018 and 11/19/2018 FINDINGS: There is diffuse increased bowel gas. Small bowel is dilated. No colonic distention. Stomach is moderately distended. No gross soft tissue abnormality. Enteric tube lies within the stomach. IMPRESSION: 1. Diffusely increased bowel gas with small bowel dilation. This may reflect an adynamic ileus. Partial distal small-bowel obstruction is possible. Electronically Signed   By:  Lajean Manes M.D.   On: 11/23/2018 10:01   Dg Abd 1 View  Result Date: 11/19/2018 CLINICAL DATA:  NG tube EXAM: ABDOMEN - 1 VIEW COMPARISON:  03/06/2014 FINDINGS: NG tube tip is in the distal stomach. IMPRESSION: NG tube tip in the distal stomach. Electronically Signed   By: Rolm Baptise M.D.   On: 11/19/2018 19:11   Ct Head Wo Contrast  Result Date: 11/08/2018 CLINICAL DATA:  58 year old female with seizure-like activity, altered mental status, intubated. EXAM: CT HEAD WITHOUT CONTRAST TECHNIQUE: Contiguous axial images were obtained from the base of the skull through the vertex without intravenous contrast. COMPARISON:   09/14/2018 head CTs and earlier. FINDINGS: Brain: Cerebral volume is stable and normal. No midline shift, ventriculomegaly, mass effect, evidence of mass lesion, intracranial hemorrhage or evidence of cortically based acute infarction. Gray-white matter differentiation is within normal limits throughout the brain. Vascular: Mild Calcified atherosclerosis at the skull base. No suspicious intracranial vascular hyperdensity. Skull: Negative. Sinuses/Orbits: Paranasal sinuses and mastoids are stable and well pneumatized. Other: Intubated. Negative orbits. Negative scalp soft tissues. IMPRESSION: Stable and normal noncontrast CT appearance of the brain. Electronically Signed   By: Genevie Ann M.D.   On: 11/08/2018 21:20   Ct Angio Chest Pe W And/or Wo Contrast  Result Date: 11/08/2018 CLINICAL DATA:  Severe hypoxia. Pt was unresponsive with agonal respirations this morning when EMS was called to patient's house. History of COPD and breast cancer EXAM: CT ANGIOGRAPHY CHEST WITH CONTRAST TECHNIQUE: Multidetector CT imaging of the chest was performed using the standard protocol during bolus administration of intravenous contrast. Multiplanar CT image reconstructions and MIPs were obtained to evaluate the vascular anatomy. CONTRAST:  26mL ISOVUE-370 IOPAMIDOL (ISOVUE-370) INJECTION 76% COMPARISON:  Current chest radiograph.  Chest CTA, 11/12/2017. FINDINGS: Cardiovascular: There is satisfactory opacification of the pulmonary arteries to the segmental level. There is no evidence of a pulmonary embolism. Heart is relatively small. There is irregular thickening of the right ventricular wall. No pericardial effusion. No coronary artery calcifications. Mild aortic atherosclerosis. Mediastinum/Nodes: No mediastinal or hilar masses or enlarged lymph nodes. Trachea is widely patent. Esophagus is unremarkable. Nasal/orogastric tube tip lies in the distal esophagus. It does not enter the stomach. Endotracheal tube tip lies 5.7 cm above  the Carina. Lungs/Pleura: There are advanced changes of centrilobular emphysema. Lungs are markedly hyperexpanded. No evidence of pneumonia or pulmonary edema. There are several nodules. Pleural base nodule, lateral right upper lobe, image 62, series 6, measuring 5 mm, previously 9 mm. Nodule in the right lower lobe abutting the oblique fissure, image 110, mean 5 mm, previously 6 mm. Pleural based nodule, posteromedial right lower lobe, image 118, 7 mm, previously 8 mm. There are additional lung base opacities consistent with atelectasis and/or scarring, improved compared to the prior CT. No pleural effusion.  No pneumothorax. Upper Abdomen: No acute abnormality. Musculoskeletal: No chest wall abnormality. No acute or significant osseous findings. Review of the MIP images confirms the above findings. IMPRESSION: 1. No evidence of a pulmonary embolism.  No acute findings. 2. Advanced COPD. Several pleural-based nodules, decreased in size from the prior CT. No new nodules. 3. Nasal/orogastric tube tip lies in the distal esophagus. It does not enter the stomach. Recommend further insertion approximately 15 cm. 4. Endotracheal tube tip projects 5.7 cm above the Carina. Consider advancing 2-3 cm for more optimal positioning. Aortic Atherosclerosis (ICD10-I70.0) and Emphysema (ICD10-J43.9). Electronically Signed   By: Lajean Manes M.D.   On: 11/08/2018 10:19  Dg Chest Port 1 View  Result Date: 11/23/2018 CLINICAL DATA:  Acute respiratory failure with hypoxia. EXAM: PORTABLE CHEST 1 VIEW COMPARISON:  11/21/2018 FINDINGS: Stable pulmonary hyperinflation. Heart size and mediastinal contours are stable and within normal limits. Tracheostomy tube tip terminates over the superior mediastinum. Weighted feeding tube extends below the left hemidiaphragm. The tip is excluded. Right-sided approach catheter terminates in the distal SVC. No pneumothorax. Pulmonary hyperinflation with bibasilar subsegmental atelectasis. No overt  pulmonary edema. Mild emphysematous hyperinflation of the lungs. IMPRESSION: Stable support line and tube positions. Pulmonary hyperinflation without change. Electronically Signed   By: Ashley Royalty M.D.   On: 11/23/2018 04:00   Dg Chest Port 1 View  Result Date: 11/21/2018 CLINICAL DATA:  Respiratory failure EXAM: PORTABLE CHEST 1 VIEW COMPARISON:  November 20, 2018 FINDINGS: Tracheostomy catheter tip is 8.8 cm above the carina. Feeding tube tip is in the stomach. Central catheter tip is at the cavoatrial junction. No pneumothorax. Lungs hyperexpanded. There is scarring in the right base. There is no appreciable edema or consolidation. Heart size and pulmonary vascularity normal. No adenopathy. Surgical clips are present in the right axillary region. IMPRESSION: Tube and catheter positions as described without pneumothorax. Lungs hyperexpanded. Scarring right base. No edema or consolidation. Stable cardiac silhouette. Postoperative change right breast region. Electronically Signed   By: Lowella Grip III M.D.   On: 11/21/2018 07:58   Dg Chest Port 1 View  Result Date: 11/20/2018 CLINICAL DATA:  Respiratory failure EXAM: PORTABLE CHEST 1 VIEW COMPARISON:  November 19, 2018 FINDINGS: Tracheostomy catheter tip is 10.0 cm above the carina. Nasogastric tube tip and side port are in the distal stomach region. Central catheter tip is in the superior vena cava. No pneumothorax. Lungs remain hyperexpanded. There is scarring in the lateral right base. There is no edema or consolidation. Heart size and pulmonary vascularity are normal. No adenopathy. Postoperative changes are noted in the right axillary region. IMPRESSION: Tube and catheter positions as described without pneumothorax. Lungs remain hyperexpanded. Scarring right base. No edema or consolidation. Stable cardiac silhouette. Electronically Signed   By: Lowella Grip III M.D.   On: 11/20/2018 07:42   Dg Chest Port 1 View  Result Date:  11/19/2018 CLINICAL DATA:  Tracheostomy tube placement. EXAM: PORTABLE CHEST 1 VIEW COMPARISON:  11/19/2018 FINDINGS: The tip of the tracheostomy tube is 9 cm above the carina. The NG tube is been removed. The cardiac silhouette, mediastinal and hilar contours are normal in stable. Stable emphysematous changes with marked hyperinflation. No acute pulmonary findings or pleural effusion. IMPRESSION: Tracheostomy tube in good position. No complicating features are demonstrated. Stable advanced emphysematous changes and marked hyperinflation. No acute overlying pulmonary process. Electronically Signed   By: Marijo Sanes M.D.   On: 11/19/2018 15:11   Dg Chest Port 1 View  Result Date: 11/19/2018 CLINICAL DATA:  Respiratory failure EXAM: PORTABLE CHEST 1 VIEW COMPARISON:  Yesterday FINDINGS: Endotracheal tube tip just below the clavicular heads. The orogastric tube reaches the stomach. COPD. There is increasing hazy density at the right base favoring atelectasis and trace pleural fluid. No pneumothorax. Normal heart size. IMPRESSION: 1. Stable hardware positioning. 2. Worsening aeration over the right diaphragm. Electronically Signed   By: Monte Fantasia M.D.   On: 11/19/2018 08:17   Dg Chest Port 1 View  Result Date: 11/18/2018 CLINICAL DATA:  Hypoxia EXAM: PORTABLE CHEST 1 VIEW COMPARISON:  Earlier today FINDINGS: Endotracheal tube tip between the clavicular heads and carina. The orogastric tube reaches  the stomach. COPD. Mild hazy density at the right base with relative volume loss, likely atelectasis. No Kerley lines, effusion, or pneumothorax. Normal heart size. IMPRESSION: 1. Stable hardware positioning. 2. Minimal atelectasis at the right base. 3. No significant change over the prior ~4 hours. Electronically Signed   By: Monte Fantasia M.D.   On: 11/18/2018 09:32   Dg Chest Port 1 View  Result Date: 11/18/2018 CLINICAL DATA:  Respiratory failure. EXAM: PORTABLE CHEST 1 VIEW COMPARISON:  11/17/2018  and 11/16/2018. FINDINGS: 0451 hours. Two views obtained. Tip of the endotracheal tube is stable in the mid trachea. Enteric tube projects below the diaphragm, tip not visualized. The heart size and mediastinal contours are stable. The lungs are hyperinflated but clear. There is no pleural effusion or pneumothorax. Postsurgical changes are present in the right axilla. IMPRESSION: Stable chest without acute findings. Electronically Signed   By: Richardean Sale M.D.   On: 11/18/2018 08:19   Dg Chest Port 1 View  Result Date: 11/17/2018 CLINICAL DATA:  58 year old female with history of respiratory failure. EXAM: PORTABLE CHEST 1 VIEW COMPARISON:  Chest x-ray 11/16/2018. FINDINGS: An endotracheal tube is in place with tip 9.5 cm above the carina. A nasogastric tube is seen extending into the stomach, however, the tip of the nasogastric tube extends below the lower margin of the image. Lung volumes are increased with emphysematous changes throughout the lungs bilaterally. No consolidative airspace disease. No pleural effusions. No evidence of pulmonary edema. Heart size is normal. Upper mediastinal contours are within normal limits. IMPRESSION: 1. Support apparatus, as above. 2. No radiographic evidence of acute cardiopulmonary disease. 3. Emphysema. Electronically Signed   By: Vinnie Langton M.D.   On: 11/17/2018 07:31   Dg Chest Port 1 View  Result Date: 11/16/2018 CLINICAL DATA:  58 year old female with acute respiratory failure. Subsequent encounter. EXAM: PORTABLE CHEST 1 VIEW COMPARISON:  11/15/2018 chest x-ray.  11/08/2018 chest CT. FINDINGS: Endotracheal tube tip 8.6 cm above the carina. Nasogastric tube courses below the diaphragm. Tip is not included on the present exam. Emphysema. Probable prominent nipple shadow. No infiltrate, pulmonary edema or pneumothorax. Prior right axillary lymph node dissection. Heart size within normal limits. Inferolateral lung bases not entirely included on present exam.  IMPRESSION: 1.  Emphysema (ICD10-J43.9). 2. No infiltrate or congestive heart failure. 3. Endotracheal tube tip 8.6 cm above the carina. Electronically Signed   By: Genia Del M.D.   On: 11/16/2018 07:13   Dg Chest Port 1 View  Result Date: 11/15/2018 CLINICAL DATA:  Acute respiratory failure. EXAM: PORTABLE CHEST 1 VIEW COMPARISON:  11/14/2018 FINDINGS: The endotracheal tube is 9 cm above the carina. The NG tube is coursing down the esophagus and into the stomach. Stable advanced emphysematous changes with marked hyperinflation. No definite acute overlying pulmonary process. No pneumothorax. IMPRESSION: Stable advanced emphysematous changes but no acute overlying pulmonary process. Stable endotracheal tube and NG tube. Electronically Signed   By: Marijo Sanes M.D.   On: 11/15/2018 05:22   Dg Chest Port 1 View  Result Date: 11/14/2018 CLINICAL DATA:  Respiratory failure EXAM: PORTABLE CHEST 1 VIEW COMPARISON:  Yesterday FINDINGS: Endotracheal tube tip at the clavicular heads. The orogastric tube tip is in the stomach. Hyperinflation and interstitial coarsening, stable. Normal heart size and mediastinal contours. No effusion or air leak. IMPRESSION: 1. Stable hardware positioning. 2. COPD. Electronically Signed   By: Monte Fantasia M.D.   On: 11/14/2018 07:39   Dg Chest [facility] 1 View  Result  Date: 11/13/2018 CLINICAL DATA:  Hypoxia EXAM: PORTABLE CHEST 1 VIEW COMPARISON:  November 12, 2018 FINDINGS: Endotracheal tube tip is 6.7 cm above the carina. Nasogastric tube tip and side port are in the stomach. No pneumothorax. Lungs are hyperexpanded. There is no appreciable edema or consolidation. The heart size and pulmonary vascularity are within normal limits. No adenopathy. There are surgical clips in the right breast and axillary regions. IMPRESSION: Tube positions as described without pneumothorax. Lungs hyperexpanded without edema or consolidation. Stable cardiac silhouette. Electronically Signed   By:  Lowella Grip III M.D.   On: 11/13/2018 07:14   Dg Chest Port 1 View  Result Date: 11/12/2018 CLINICAL DATA:  Respiratory failure EXAM: PORTABLE CHEST 1 VIEW COMPARISON:  Yesterday FINDINGS: Endotracheal tube tip at the clavicular heads. The orogastric tube tip and side-port are at the stomach. The hyperinflated lungs remain clear. No pneumothorax. Normal heart size and mediastinal contours. IMPRESSION: 1. Stable, unremarkable hardware positioning. 2. COPD with clear lungs. Electronically Signed   By: Monte Fantasia M.D.   On: 11/12/2018 06:56   Dg Chest Port 1 View  Result Date: 11/11/2018 CLINICAL DATA:  Respiratory failure EXAM: PORTABLE CHEST 1 VIEW COMPARISON:  Yesterday FINDINGS: Endotracheal tube tip just below the clavicular heads. The orogastric tube tip reaches the stomach. Hyperinflation. There is no edema, consolidation, effusion, or pneumothorax. Normal heart size and mediastinal contours. IMPRESSION: 1. Stable hardware positioning. 2. COPD without acute superimposed finding. Electronically Signed   By: Monte Fantasia M.D.   On: 11/11/2018 08:37   Dg Chest Port 1 View  Result Date: 11/10/2018 CLINICAL DATA:  Aspiration pneumonia EXAM: PORTABLE CHEST 1 VIEW COMPARISON:  Yesterday FINDINGS: Endotracheal tube tip between the clavicular heads and carina. The orogastric tube reaches the stomach at least. Hyperinflation. There is no edema, consolidation, effusion, or pneumothorax. Normal heart size and mediastinal contours. Postoperative right breast and axilla. IMPRESSION: 1. Stable hardware positioning. 2. COPD. Electronically Signed   By: Monte Fantasia M.D.   On: 11/10/2018 06:05   Dg Chest Port 1 View  Result Date: 11/09/2018 CLINICAL DATA:  Shortness of breath EXAM: PORTABLE CHEST 1 VIEW COMPARISON:  11/08/2018 FINDINGS: Cardiac shadow is within normal limits. The lungs are again hyperinflated. Endotracheal tube and gastric catheter are again seen and stable. No focal infiltrate or  sizable effusion is seen. Postsurgical changes are noted in the right axilla. IMPRESSION: COPD without acute abnormality. Electronically Signed   By: Inez Catalina M.D.   On: 11/09/2018 07:35   Dg Chest Port 1 View  Result Date: 11/08/2018 CLINICAL DATA:  Evaluate endotracheal tube placement. EXAM: PORTABLE CHEST 1 VIEW COMPARISON:  Earlier same day FINDINGS: 1710 hours. Endotracheal tube tip is 7.9 cm above the base of the carina. The The NG tube passes into the stomach although the distal tip position is not included on the film. Lungs are markedly hyperexpanded. The lungs are clear without focal pneumonia, edema, pneumothorax or pleural effusion. The cardiopericardial silhouette is within normal limits for size. The visualized bony structures of the thorax are intact. Telemetry leads overlie the chest. IMPRESSION: 1. Emphysema without acute cardiopulmonary findings. 2. Endotracheal tube tip is 7.9 cm above the base of the carina. The Electronically Signed   By: Misty Stanley M.D.   On: 11/08/2018 18:07   Dg Chest Portable 1 View  Result Date: 11/08/2018 CLINICAL DATA:  Shortness of breath EXAM: PORTABLE CHEST 1 VIEW COMPARISON:  11/08/2018 at 0436 hours FINDINGS: Endotracheal tube terminates 7 cm above  the carina. Enteric tube terminates at the GE junction with its side port in the distal esophagus. Lungs are essentially clear.  No pleural effusion or pneumothorax. The heart is normal in size. Surgical clips in the right chest wall/axilla. IMPRESSION: Endotracheal tube terminates 7 cm above the carina. Enteric tube terminates at the GE junction. Advancement into the stomach is suggested. Electronically Signed   By: Julian Hy M.D.   On: 11/08/2018 09:31   Dg Chest Port 1 View  Result Date: 11/08/2018 CLINICAL DATA:  Shortness of breath EXAM: PORTABLE CHEST 1 VIEW COMPARISON:  09/11/2018 FINDINGS: Lungs are clear.  No pleural effusion or pneumothorax. Heart is normal in size. IMPRESSION: No evidence  of acute cardiopulmonary disease. Electronically Signed   By: Julian Hy M.D.   On: 11/08/2018 04:59   Dg Abd Portable 1v  Result Date: 11/25/2018 CLINICAL DATA:  Ileus. EXAM: PORTABLE ABDOMEN - 1 VIEW COMPARISON:  Radiograph of November 23, 2018. FINDINGS: Distal tip of nasogastric tube is seen in expected position of distal stomach or proximal duodenum. Distal tip of feeding tube is seen in distal stomach. No abnormal bowel dilatation is noted. This is significantly improved compared to prior exam. No abnormal calcifications noted IMPRESSION: No definite evidence of bowel obstruction or ileus. Distal tip of nasogastric tube seen in either distal stomach or proximal duodenum. Distal tip of feeding tube seen in distal stomach. Electronically Signed   By: Marijo Conception, M.D.   On: 11/25/2018 11:28   Dg Abd Portable 1v  Result Date: 11/20/2018 CLINICAL DATA:  Status post feeding tube placement. EXAM: PORTABLE ABDOMEN - 1 VIEW COMPARISON:  Single-view of the abdomen yesterday. FINDINGS: NG tube has been removed. Feeding tube is in place and looped in the stomach with the tip directed superiorly. IMPRESSION: As above. Electronically Signed   By: Inge Rise M.D.   On: 11/20/2018 13:19   Vas [place] Lower Extremity Venous (dvt)  Result Date: 11/17/2018  Lower Venous Study Indications: Swelling, Edema, and thrombocytopenia, Resparitory failure.  Limitations: Immobility due to unconsciousness. Comparison Study: LLEV study on 01/26/2018 Performing Technologist: Rudell Cobb  Examination Guidelines: A complete evaluation includes B-mode imaging, spectral Doppler, color Doppler, and power Doppler as needed of all accessible portions of each vessel. Bilateral testing is considered an integral part of a complete examination. Limited examinations for reoccurring indications may be performed as noted.  Right Venous Findings: +---------+---------------+---------+-----------+----------+-------------------+           CompressibilityPhasicitySpontaneityPropertiesSummary             +---------+---------------+---------+-----------+----------+-------------------+ CFV      Full           Yes      Yes                                      +---------+---------------+---------+-----------+----------+-------------------+ SFJ      Full                                                             +---------+---------------+---------+-----------+----------+-------------------+ FV Prox  Full                                                             +---------+---------------+---------+-----------+----------+-------------------+  FV Mid   Full                                                             +---------+---------------+---------+-----------+----------+-------------------+ FV DistalFull                                                             +---------+---------------+---------+-----------+----------+-------------------+ PFV      Full                                                             +---------+---------------+---------+-----------+----------+-------------------+ POP      Full           Yes      Yes                                      +---------+---------------+---------+-----------+----------+-------------------+ PTV                              Yes                  not well visualized                                                       due to severe edema +---------+---------------+---------+-----------+----------+-------------------+ PERO                             Yes                  not well visualized                                                       due to severe edema +---------+---------------+---------+-----------+----------+-------------------+  Left Venous Findings: +---------+---------------+---------+-----------+----------+-------------------+          CompressibilityPhasicitySpontaneityPropertiesSummary              +---------+---------------+---------+-----------+----------+-------------------+ CFV      Full           Yes      Yes                                      +---------+---------------+---------+-----------+----------+-------------------+ SFJ      Full                                                             +---------+---------------+---------+-----------+----------+-------------------+  FV Prox  Full                                                             +---------+---------------+---------+-----------+----------+-------------------+ FV Mid   Full                                                             +---------+---------------+---------+-----------+----------+-------------------+ FV DistalFull                                                             +---------+---------------+---------+-----------+----------+-------------------+ PFV      Full                                                             +---------+---------------+---------+-----------+----------+-------------------+ POP      Full           Yes      Yes                                      +---------+---------------+---------+-----------+----------+-------------------+ PTV                                                   not well visualized                                                       due to severe edema +---------+---------------+---------+-----------+----------+-------------------+ PERO                                                  not well visualized                                                       due to severe edema +---------+---------------+---------+-----------+----------+-------------------+   Summary: Right: There is no evidence of deep vein thrombosis in the lower extremity. However, portions of this examination were limited- see technologist comments above. No cystic structure found in the popliteal fossa. Left: There is  no evidence of deep vein thrombosis in the lower extremity. However, portions  of this examination were limited- see technologist comments above. No cystic structure found in the popliteal fossa.  *See table(s) above for measurements and observations. Electronically signed by Deitra Mayo MD on 11/17/2018 at 4:52:25 PM.    Final    Vas [place] Upper Extremity Venous Duplex  Result Date: 11/17/2018 UPPER VENOUS STUDY  Indications: Edema, and Thrombocytopenia, Resparitory failure. Comparison Study: No prior study available Performing Technologist: Rudell Cobb  Examination Guidelines: A complete evaluation includes B-mode imaging, spectral Doppler, color Doppler, and power Doppler as needed of all accessible portions of each vessel. Bilateral testing is considered an integral part of a complete examination. Limited examinations for reoccurring indications may be performed as noted.  Right Findings: +----------+------------+----------+---------+-----------+-------+ RIGHT     CompressiblePropertiesPhasicitySpontaneousSummary +----------+------------+----------+---------+-----------+-------+ IJV           Full                 Yes       Yes            +----------+------------+----------+---------+-----------+-------+ Subclavian    Full                 Yes       Yes            +----------+------------+----------+---------+-----------+-------+ Axillary      Full                 Yes       Yes            +----------+------------+----------+---------+-----------+-------+ Brachial      Full                 Yes       Yes            +----------+------------+----------+---------+-----------+-------+ Radial        Full                                          +----------+------------+----------+---------+-----------+-------+ Ulnar         Full                                          +----------+------------+----------+---------+-----------+-------+ Cephalic      None                                    Acute  +----------+------------+----------+---------+-----------+-------+ Basilic       Full                 Yes       Yes            +----------+------------+----------+---------+-----------+-------+  Left Findings: +----------+------------+----------+---------+-----------+-------+ LEFT      CompressiblePropertiesPhasicitySpontaneousSummary +----------+------------+----------+---------+-----------+-------+ IJV           Full                 Yes       Yes            +----------+------------+----------+---------+-----------+-------+ Subclavian    Full                 Yes       Yes            +----------+------------+----------+---------+-----------+-------+ Axillary  Full                 Yes       Yes            +----------+------------+----------+---------+-----------+-------+ Brachial      Full                 Yes       Yes            +----------+------------+----------+---------+-----------+-------+ Radial        Full                                          +----------+------------+----------+---------+-----------+-------+ Ulnar         Full                                          +----------+------------+----------+---------+-----------+-------+ Cephalic      None                                   Acute  +----------+------------+----------+---------+-----------+-------+ Basilic       Full                                          +----------+------------+----------+---------+-----------+-------+  Summary:  Right: No evidence of deep vein thrombosis in the upper extremity. No evidence of thrombosis in the subclavian. Findings consistent with acute superficial vein thrombosis involving the right cephalic vein.  Left: No evidence of deep vein thrombosis in the upper extremity. No evidence of thrombosis in the subclavian. Findings consistent with acute superficial vein thrombosis involving the left cephalic vein.   *See table(s) above for measurements and observations.  Diagnosing physician: Deitra Mayo MD Electronically signed by Deitra Mayo MD on 11/17/2018 at 4:53:37 PM.    Final    [place] Ekg Site Rite  Result Date: 11/19/2018 If Site Rite image not attached, placement could not be confirmed due to current cardiac rhythm.      Subjective: Some mild chest tightness, otherwise, happy be eating and happy to be getting NG tube out of her nose.  Discharge Exam: Vitals:   11/27/18 0937 11/27/18 1209  BP:    Pulse: 84 88  Resp: 18 18  Temp:    SpO2: 98% 96%   Vitals:   11/27/18 0640 11/27/18 0815 11/27/18 0937 11/27/18 1209  BP:  128/90    Pulse:  80 84 88  Resp:  18 18 18   Temp:  98.2 F (36.8 C)    TempSrc:  Oral    SpO2:  97% 98% 96%  Weight: 44.3 kg     Height:        General: Pt is alert, awake, not in acute distress Cardiovascular: RRR, S1/S2 +, no rubs, no gallops Respiratory: Rhonchi. Normal effort Abdominal: Soft, NT, ND, bowel sounds + Extremities: no edema, no cyanosis    The results of significant diagnostics from this hospitalization (including imaging, microbiology, ancillary and laboratory) are listed below for reference.     Microbiology: No results found for this or any previous visit (from the past 240 hour(s)).   Labs: BNP (last  3 results) Recent Labs    06/08/18 1413 11/08/18 0429 11/08/18 1703  BNP 35.0 32.2 09.6   Basic Metabolic Panel: Recent Labs  Lab 11/22/18 0031  11/23/18 0304 11/24/18 0430 11/25/18 0448 11/26/18 0650 11/27/18 0650  NA 140  --  140 137 138 138  --   K 3.6  --  3.7 3.4* 2.9* 3.5  --   CL 102  --  100 98 97* 98  --   CO2 33*  --  31 29 30 26   --   GLUCOSE 102*  --  76 86 83 99  --   BUN 14  --  12 10 8 7   --   CREATININE 0.33*  --  0.33* 0.35* 0.36* 0.36*  --   CALCIUM 8.6*  --  9.2 8.9 8.9 9.1  --   MG  --    < > 1.8 1.7 1.7 1.7 1.6*  PHOS  --    < > 3.2 2.9 3.5 3.1 3.7   < > = values in this interval  not displayed.   Liver Function Tests: Recent Labs  Lab 11/22/18 0430  AST 21  ALT 17  ALKPHOS 51  BILITOT 0.6  PROT 6.0*  ALBUMIN 2.8*   No results for input(s): LIPASE, AMYLASE in the last 168 hours. No results for input(s): AMMONIA in the last 168 hours. CBC: Recent Labs  Lab 11/21/18 0348 11/22/18 0430 11/23/18 0304 11/24/18 0430 11/25/18 0448  WBC 14.9* 18.1* 16.1* 15.2* 13.5*  NEUTROABS  --  13.9* 12.3* 12.7* 11.2*  HGB 10.9* 10.1* 9.7* 9.6* 10.6*  HCT 36.1 33.6* 31.9* 30.8* 34.5*  MCV 88.7 89.6 88.6 88.3 88.5  PLT 232 212 205 200 226   Cardiac Enzymes: No results for input(s): CKTOTAL, CKMB, CKMBINDEX, TROPONINI in the last 168 hours. BNP: Invalid input(s): POCBNP CBG: Recent Labs  Lab 11/26/18 1931 11/27/18 0050 11/27/18 0350 11/27/18 0752 11/27/18 1211  GLUCAP 102* 133* 120* 94 149*   D-Dimer No results for input(s): DDIMER in the last 72 hours. Hgb A1c No results for input(s): HGBA1C in the last 72 hours. Lipid Profile No results for input(s): CHOL, HDL, LDLCALC, TRIG, CHOLHDL, LDLDIRECT in the last 72 hours. Thyroid function studies No results for input(s): TSH, T4TOTAL, T3FREE, THYROIDAB in the last 72 hours.  Invalid input(s): FREET3 Anemia work up No results for input(s): VITAMINB12, FOLATE, FERRITIN, TIBC, IRON, RETICCTPCT in the last 72 hours. Urinalysis    Component Value Date/Time   COLORURINE YELLOW 11/08/2018 1049   APPEARANCEUR CLOUDY (A) 11/08/2018 1049   LABSPEC 1.024 11/08/2018 1049   PHURINE 7.0 11/08/2018 1049   GLUCOSEU NEGATIVE 11/08/2018 1049   HGBUR SMALL (A) 11/08/2018 1049   BILIRUBINUR NEGATIVE 11/08/2018 1049   KETONESUR NEGATIVE 11/08/2018 1049   PROTEINUR 30 (A) 11/08/2018 1049   UROBILINOGEN 0.2 03/06/2014 1921   NITRITE POSITIVE (A) 11/08/2018 1049   LEUKOCYTESUR NEGATIVE 11/08/2018 1049    SIGNED:   Cordelia Poche, MD [facility] 11/27/2018, 1:35 PM

## 2018-11-28 ENCOUNTER — Inpatient Hospital Stay (HOSPITAL_COMMUNITY): Payer: Medicaid Other | Admitting: Physical Therapy

## 2018-11-28 ENCOUNTER — Inpatient Hospital Stay (HOSPITAL_COMMUNITY): Payer: Medicaid Other | Admitting: Occupational Therapy

## 2018-11-28 ENCOUNTER — Inpatient Hospital Stay (HOSPITAL_COMMUNITY): Payer: Medicaid Other | Admitting: Speech Pathology

## 2018-11-28 DIAGNOSIS — G9341 Metabolic encephalopathy: Secondary | ICD-10-CM

## 2018-11-28 LAB — GLUCOSE, CAPILLARY
GLUCOSE-CAPILLARY: 80 mg/dL (ref 70–99)
Glucose-Capillary: 112 mg/dL — ABNORMAL HIGH (ref 70–99)
Glucose-Capillary: 119 mg/dL — ABNORMAL HIGH (ref 70–99)
Glucose-Capillary: 125 mg/dL — ABNORMAL HIGH (ref 70–99)
Glucose-Capillary: 152 mg/dL — ABNORMAL HIGH (ref 70–99)
Glucose-Capillary: 57 mg/dL — ABNORMAL LOW (ref 70–99)
Glucose-Capillary: 64 mg/dL — ABNORMAL LOW (ref 70–99)
Glucose-Capillary: 82 mg/dL (ref 70–99)
Glucose-Capillary: 90 mg/dL (ref 70–99)

## 2018-11-28 MED ORDER — GLUCOSE 40 % PO GEL
ORAL | Status: AC
  Administered 2018-11-28: 18:00:00
  Filled 2018-11-28: qty 1

## 2018-11-28 NOTE — Progress Notes (Signed)
Trach care done. Patient tolerated it well. No distress or complications noted.

## 2018-11-28 NOTE — Progress Notes (Signed)
Patient alert and oriented and nods and gestures appropriately; meds as ordered this shift; trach collar at  28%/5L and sat range between 94-96%. Boyfriend/ Caregiver by side. Patient voices no distress or pain at this time. Consumed breakfast without difficulty from staff this am.  PICC line intact and patient remains on continuous pulse oximetry. Will continue to monitor.

## 2018-11-28 NOTE — Progress Notes (Signed)
Hypoglycemic Event  CBG: 64  Treatment: 4 oz juice/soda and 1 tube glucose gel  Symptoms: Pale and Shaky  Follow-up CBG: [id number] CBG Result:112  Possible Reasons for Event: Inadequate meal intake  Comments/MD notified Repeat CBG taken after dinner and 8oz juice and patient was 57. Pt then given tube of oral glucose gel. CBG retaken and patient was 112.     Hanson Medeiros S

## 2018-11-28 NOTE — Plan of Care (Signed)
  Problem: Consults Goal: RH GENERAL PATIENT EDUCATION Description See Patient Education module for education specifics. Outcome: Progressing   Problem: RH SKIN INTEGRITY Goal: RH STG MAINTAIN SKIN INTEGRITY WITH ASSISTANCE Description STG Maintain Skin Integrity With min Assistance.  Outcome: Progressing   Problem: RH SAFETY Goal: RH STG ADHERE TO SAFETY PRECAUTIONS W/ASSISTANCE/DEVICE Description STG Adhere to Safety Precautions With cues/reminders Assistance/Device.  Outcome: Progressing
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
## 2018-11-28 NOTE — Progress Notes (Addendum)
Patient noted resting quietly at this time. Trach suctioning performed x2 so far this shift. Moderate white secretions noted. Pt tolerated without difficulty. O2 at 28% continues via trach collar. No distress noted. Continuous pulse oximetry in progress. Pt's boyfriend now in room, approaches this nurse with requests to feed patient candy, pt and boyfriend educated on pt's modified diet, as well as nectar thickened liquids. Pt's boyfriend informed that staff are the only ones allowed to feed patient at this time, until he is cleared by speech therapist to feed patient. Boyfriend states "well I been feeding her", however does agree to comply with guidelines. Will continue to monitor.
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
## 2018-11-29 ENCOUNTER — Inpatient Hospital Stay (HOSPITAL_COMMUNITY): Payer: Medicaid Other | Admitting: Speech Pathology

## 2018-11-29 LAB — GLUCOSE, CAPILLARY
Glucose-Capillary: 86 mg/dL (ref 70–99)
Glucose-Capillary: 69 mg/dL — ABNORMAL LOW (ref 70–99)
Glucose-Capillary: 88 mg/dL (ref 70–99)
Glucose-Capillary: 94 mg/dL (ref 70–99)
Glucose-Capillary: 75 mg/dL (ref 70–99)
Glucose-Capillary: 135 mg/dL — ABNORMAL HIGH (ref 70–99)

## 2018-11-29 MED ORDER — IPRATROPIUM-ALBUTEROL 0.5-2.5 (3) MG/3ML IN SOLN: 3.0000 mL | Freq: Two times a day (BID) | RESPIRATORY_TRACT | Status: DC

## 2018-11-29 MED ORDER — IPRATROPIUM-ALBUTEROL 0.5-2.5 (3) MG/3ML IN SOLN
3.0000 mL | Freq: Two times a day (BID) | RESPIRATORY_TRACT | Status: DC
  Administered 2018-11-29 – 2018-12-24 (×48): 3 mL via RESPIRATORY_TRACT
  Filled 2018-11-29 (×50): qty 3
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
## 2018-11-29 NOTE — Progress Notes (Signed)
Patient is resting comfortably at this time

## 2018-11-29 NOTE — Progress Notes (Signed)
Hypoglycemic Event  CBG: 69  Treatment: 4 oz juice/soda and breakfast tray given   Symptoms: None  Follow-up CBG: Time:08:36 CBG Result:88    Possible Reasons for Event: Inadequate meal intake  Comments/MD notified:patients sugar came up to 888 after having breakfast try     [name]

## 2018-11-30 ENCOUNTER — Other Ambulatory Visit: Payer: Self-pay | Admitting: Hematology

## 2018-11-30 ENCOUNTER — Inpatient Hospital Stay (HOSPITAL_COMMUNITY): Payer: Medicaid Other | Admitting: Occupational Therapy

## 2018-11-30 ENCOUNTER — Inpatient Hospital Stay (HOSPITAL_COMMUNITY): Payer: Medicaid Other | Admitting: Physical Therapy

## 2018-11-30 ENCOUNTER — Inpatient Hospital Stay (HOSPITAL_COMMUNITY): Payer: Medicaid Other

## 2018-11-30 DIAGNOSIS — Z930 Tracheostomy status: Secondary | ICD-10-CM

## 2018-11-30 DIAGNOSIS — R5381 Other malaise: Principal | ICD-10-CM

## 2018-11-30 DIAGNOSIS — C50411 Malignant neoplasm of upper-outer quadrant of right female breast: Secondary | ICD-10-CM

## 2018-11-30 LAB — CBC WITH DIFFERENTIAL/PLATELET
Abs Immature Granulocytes: 0.06 10*3/uL (ref 0.00–0.07)
Basophils Absolute: 0 10*3/uL (ref 0.0–0.1)
Basophils Relative: 0 %
EOS PCT: 1 %
Eosinophils Absolute: 0.1 10*3/uL (ref 0.0–0.5)
HCT: 35.3 % — ABNORMAL LOW (ref 36.0–46.0)
HEMOGLOBIN: 10.7 g/dL — AB (ref 12.0–15.0)
Immature Granulocytes: 1 %
LYMPHS PCT: 13 %
Lymphs Abs: 1.5 10*3/uL (ref 0.7–4.0)
MCH: 27.2 pg (ref 26.0–34.0)
MCHC: 30.3 g/dL (ref 30.0–36.0)
MCV: 89.6 fL (ref 80.0–100.0)
Monocytes Absolute: 1 10*3/uL (ref 0.1–1.0)
Monocytes Relative: 9 %
Neutro Abs: 8.3 10*3/uL — ABNORMAL HIGH (ref 1.7–7.7)
Neutrophils Relative %: 76 %
Platelets: 264 10*3/uL (ref 150–400)
RBC: 3.94 MIL/uL (ref 3.87–5.11)
RDW: 15.2 % (ref 11.5–15.5)
WBC: 11 10*3/uL — ABNORMAL HIGH (ref 4.0–10.5)
nRBC: 0 % (ref 0.0–0.2)

## 2018-11-30 LAB — COMPREHENSIVE METABOLIC PANEL
ALK PHOS: 42 U/L (ref 38–126)
ALT: 12 U/L (ref 0–44)
AST: 13 U/L — ABNORMAL LOW (ref 15–41)
Albumin: 2.7 g/dL — ABNORMAL LOW (ref 3.5–5.0)
Anion gap: 8 (ref 5–15)
BUN: 9 mg/dL (ref 6–20)
CALCIUM: 8.9 mg/dL (ref 8.9–10.3)
CO2: 29 mmol/L (ref 22–32)
Chloride: 98 mmol/L (ref 98–111)
Creatinine, Ser: 0.33 mg/dL — ABNORMAL LOW (ref 0.44–1.00)
GFR calc Af Amer: >60 mL/min (ref >60)
Glucose, Bld: 87 mg/dL (ref 70–99)
Potassium: 3.5 mmol/L (ref 3.5–5.1)
Sodium: 135 mmol/L (ref 135–145)
Total Bilirubin: 0.3 mg/dL (ref 0.3–1.2)
Total Protein: 5.8 g/dL — ABNORMAL LOW (ref 6.5–8.1)

## 2018-11-30 LAB — GLUCOSE, CAPILLARY
Glucose-Capillary: 82 mg/dL (ref 70–99)
Glucose-Capillary: 87 mg/dL (ref 70–99)
Glucose-Capillary: 91 mg/dL (ref 70–99)
Glucose-Capillary: 91 mg/dL (ref 70–99)
Glucose-Capillary: 95 mg/dL (ref 70–99)
Glucose-Capillary: 98 mg/dL (ref 70–99)

## 2018-11-30 NOTE — Progress Notes (Signed)
Inpatient Rehabilitation  Patient information reviewed and entered into eRehab system by Zhyon Antenucci M. Coulton Schlink, M.A., CCC/SLP, PPS Coordinator.  Information including medical coding, functional ability and quality indicators will be reviewed and updated through discharge.    

## 2018-11-30 NOTE — Progress Notes (Signed)
Occupational Therapy Session Note  Patient Details  Name: Courtney Terry MRN: [id number] Date of Birth: 08-19-1961  Today's Date: 11/30/2018 OT Individual Time: 1405-1430 OT Individual Time Calculation (min): 25 min    Short Term Goals: Week 1:  OT Short Term Goal 1 (Week 1): Pt will thread 1 LE into pants with supervision  OT Short Term Goal 2 (Week 1): Pt will complete sit<stand for bathing with Min A and LRAD  OT Short Term Goal 3 (Week 1): Pt will complete toilet transfer with Min A and LRAD  Skilled Therapeutic Interventions/Progress Updates:    Pt in bed upon arrival with NT present assisting with meal.  OT intervention with focus on BUE AROM and hand strength.  Pt with ~130 BUE shoulder AROM.  Pt issued yellow foam for hand strengthening.  OT left room to gather supplies and pt was attempting to open a piece of candy with her mouth upon return.  Candy removed and NT notified.  Pt remained in bed with all needs within reach and bed alarm activated.   Therapy Documentation Precautions:  Precautions Precautions: Fall Precaution Comments: Trach collar, supplemental 02 Restrictions Weight Bearing Restrictions: No Pain: Pt denies pain  Therapy/Group: Individual Therapy  Leroy Libman 11/30/2018, 2:33 PM

## 2018-11-30 NOTE — Progress Notes (Signed)
Patient family continues to give patient food like hard chewy candy and suckers. Discussed with patient and family why this is not appropriate for her.  [name], LPN

## 2018-11-30 NOTE — Progress Notes (Signed)
Occupational Therapy Session Note  Patient Details  Name: Courtney Terry MRN: [id number] Date of Birth: [birth year]  Today's Date: 11/30/2018 OT Individual Time: 2197-5883 OT Individual Time Calculation (min): 71 min   Short Term Goals: Week 1:  OT Short Term Goal 1 (Week 1): Pt will thread 1 LE into pants with supervision  OT Short Term Goal 2 (Week 1): Pt will complete sit<stand for bathing with Min A and LRAD  OT Short Term Goal 3 (Week 1): Pt will complete toilet transfer with Min A and LRAD  Skilled Therapeutic Interventions/Progress Updates:    Pt greeted in bed, shaking her head when asked if she had pain. Breakfast had just arrived, and per RN staff pt is having a tough time eating due to hand tremors. She refused to sit EOB, and therefore resumed tx with HOB elevated. Issued pt with built up weighted spoon, and she scooped and brought food to mouth with [facility] assist initially, as she was unable to do so herself. She progressed to using weighted spoon with supervision assist for 25% of meal, integrating Lt hand to assist with scooping (using regular plastic spoon). With increased fatigue, pt adamant that she wanted OT to feed her. Resistant to [facility] assist, but agreeable with encouragement for additional 25% of meal. Due to behaviors, the remainder 50% of meal completed with Total A from OT. She was also resistant to suction during tx, despite encouragement and education as to why she needed suction for secretion mgt. Provided her with weighted mug to increase ease of drinking beverages, with pt able to consume orange juice using mug with supervision during session. Initially she required Total A for beverage mgt due to tremors. Notified RN staff to incorporate AE during mealtime to maximize her independence. They verbalized understanding. At end of session pt was repositioned for comfort in bed and left with all needs within reach and bed alarm set. 02 sats throughout 95-96% on supplemental 02  via trach collar.   Pts friend attempted to give her bacon during tx. Educated friend that pt is on a modified diet and to always ask RN permission before giving her food from outside of hospital. Also educated friend that he cannot assist her with eating until cleared by SLP, as he reports feeding her when no one is in the room. Notified RN Erline Levine.   Therapy Documentation Precautions:  Precautions Precautions: Fall Precaution Comments: Trach collar, supplemental 02 Restrictions Weight Bearing Restrictions: No Vital Signs: Therapy Vitals Pulse Rate: 81 Resp: 18 Patient Position (if appropriate): Lying Oxygen Therapy SpO2: 95 % O2 Device: Tracheostomy Collar O2 Flow Rate (L/min): 5 L/min FiO2 (%): 28 % Pain: No s/s pain during tx   ADL: ADL Eating: Not assessed Grooming: Supervision/safety Where Assessed-Grooming: Edge of bed Upper Body Bathing: Minimal assistance(for thoroughness) Where Assessed-Upper Body Bathing: Edge of bed Lower Body Bathing: Maximal assistance Where Assessed-Lower Body Bathing: Bed level, Edge of bed Upper Body Dressing: Maximal assistance Where Assessed-Upper Body Dressing: Edge of bed Lower Body Dressing: Maximal assistance Where Assessed-Lower Body Dressing: Edge of bed Toileting: Not assessed Toilet Transfer: Moderate assistance Toilet Transfer Method: Stand pivot, Squat pivot Toilet Transfer Equipment: Bedside commode, Drop arm bedside commode Tub/Shower Transfer: Not assessed      Therapy/Group: Individual Therapy  Courtney Terry 11/30/2018, 9:07 AM

## 2018-11-30 NOTE — Progress Notes (Signed)
[facility] PHYSICAL MEDICINE & REHABILITATION PROGRESS NOTE   Subjective/Complaints: No new issues m denies breathing issue  ROS- denies CP, SOB,N/V/D Objective:   No results found. Recent Labs    11/27/18 1821 11/30/18 0432  WBC 12.8* 11.0*  HGB 10.5* 10.7*  HCT 34.1* 35.3*  PLT 207 264   Recent Labs    11/27/18 1821 11/30/18 0432  NA  --  135  K  --  3.5  CL  --  98  CO2  --  29  GLUCOSE  --  87  BUN  --  9  CREATININE 0.42* 0.33*  CALCIUM  --  8.9    Intake/Output Summary (Last 24 hours) at 11/30/2018 0733 Last data filed at 11/30/2018 0230 Gross per 24 hour  Intake 328 ml  Output -  Net 328 ml     Physical Exam: Vital Signs Blood pressure 108/71, pulse 74, temperature 98.8 F (37.1 C), temperature source Oral, resp. rate 19, height 5\' 10"  (1.778 m), weight 43.2 kg, SpO2 94 %.  General: No acute distress, cachectic Mood and affect are appropriate Heart: Regular rate and rhythm no rubs murmurs or extra sounds Lungs: Clear to auscultation, breathing unlabored, no rales or wheezes Abdomen: Positive bowel sounds, soft nontender to palpation, nondistended Extremities: No clubbing, cyanosis, or edema Skin: No evidence of breakdown, no evidence of rash Neurologic: Cranial nerves II through XII intact, motor strength is 4/5 in bilateral deltoid, bicep, tricep, grip, hip flexor, knee extensors, ankle dorsiflexor and plantar flexor  Musculoskeletal: Full range of motion in all 4 extremities. No joint swelling    Assessment/Plan: 1. Functional deficits secondary to debilitiy from respiratory failure which require 3+ hours per day of interdisciplinary therapy in a comprehensive inpatient rehab setting.  Physiatrist is providing close team supervision and 24 hour management of active medical problems listed below.  Physiatrist and rehab team continue to assess barriers to discharge/monitor patient progress toward functional and medical goals  Care  Tool:  Bathing    Body parts bathed by patient: Right arm, Left arm, Chest, Abdomen, Right upper leg, Left upper leg, Face   Body parts bathed by helper: Front perineal area, Buttocks, Face     Bathing assist Assist Level: Moderate Assistance - Patient 50 - 74%     Upper Body Dressing/Undressing Upper body dressing   What is the patient wearing?: Pull over shirt    Upper body assist Assist Level: Maximal Assistance - Patient 25 - 49%    Lower Body Dressing/Undressing Lower body dressing      What is the patient wearing?: Pants     Lower body assist Assist for lower body dressing: Maximal Assistance - Patient 25 - 49%     Toileting Toileting Toileting Activity did not occur (Clothing management and hygiene only): N/A (no void or bm)  Toileting assist Assist for toileting: Dependent - Patient 0%     Transfers Chair/bed transfer  Transfers assist     Chair/bed transfer assist level: Moderate Assistance - Patient 50 - 74%     Locomotion Ambulation   Ambulation assist   Ambulation activity did not occur: Safety/medical concerns          Walk 10 feet activity   Assist  Walk 10 feet activity did not occur: Refused        Walk 50 feet activity   Assist Walk 50 feet with 2 turns activity did not occur: Safety/medical concerns         Walk 150  feet activity   Assist Walk 150 feet activity did not occur: Safety/medical concerns         Walk 10 feet on uneven surface  activity   Assist Walk 10 feet on uneven surfaces activity did not occur: Safety/medical concerns         Wheelchair     Assist   Type of Wheelchair: Manual Wheelchair activity did not occur: Refused         Wheelchair 50 feet with 2 turns activity    Assist    Wheelchair 50 feet with 2 turns activity did not occur: Refused       Wheelchair 150 feet activity     Assist Wheelchair 150 feet activity did not occur: Safety/medical concerns         Medical Problem List and Plan: 1.Debility and metabolic encephalopathysecondary to acute on chronic respiratory failure status post tracheostomy 11/19/2018.Pulmonary services to follow-up on plan decannulation CIR PT, OT,SLPteam conf in am 2. DVT Prophylaxis/Anticoagulation: Subcutaneous heparin. Monitor for any bleeding episodes 3. Pain Management:Tylenol as needed 4. Mood/bipolar disorder:Prozac 40 mg daily for mood stabilization, Klonopin 0.5 mg 3 times a day as needed for anxiety or agitation -monitor sleep patterns -sleep chart 5. Neuropsych: This patientis notcapable of making decisions on herown behalf. 6. Skin/Wound Care:Routine skin checks 7. Fluids/Electrolytes/Nutrition:Routine in and out's with follow-up chemistrieson admit 8. Chronic hypotension. Patient had been on ProAmatine 10 mg 3 times a day as well as Florinef prior to admission.Resume as neededpendng tolerance of therapy Vitals:   11/30/18 0415 11/30/18 0433  BP:  108/71  Pulse: 75 74  Resp: 19 19  Temp:  98.8 F (37.1 C)  SpO2: 95% 94%   9.COPD with history of tobacco abuse. Patient on 2 L oxygen at home.Continue chronic prednisone. Follow-up pulmonary services Dr.Wert.Continue nebulizersas directed 10. Seizure disorder. ContinueDepakoteas prior to admission.. EEG negative No clinical seizure activity 11.Hypothyroidism. Synthroid 12. Dysphagia. Dysphagia #2 nectar liquids. Follow-up speech therapy. Monitor hydration. 13. Polysubstance abuse. UDS positive for cocaine. Provide counseling 14. History of breast cancer with lumpectomy. Patient on Arimidex 1 mg daily prior to admission.  15.  Leukocytosis with chronic steroid use, afeb  LOS: 3 days A FACE TO FACE EVALUATION WAS PERFORMED  Charlett Blake 11/30/2018, 7:33 AM

## 2018-11-30 NOTE — Plan of Care (Signed)
  Problem: RH SKIN INTEGRITY Goal: RH STG SKIN FREE OF INFECTION/BREAKDOWN Description With min assist  Outcome: Progressing Goal: RH STG MAINTAIN SKIN INTEGRITY WITH ASSISTANCE Description STG Maintain Skin Integrity With min Assistance.  Outcome: Progressing   Problem: RH SAFETY Goal: RH STG ADHERE TO SAFETY PRECAUTIONS W/ASSISTANCE/DEVICE Description STG Adhere to Safety Precautions With cues/reminders Assistance/Device.  Outcome: Progressing   Problem: RH KNOWLEDGE DEFICIT GENERAL Goal: RH STG INCREASE KNOWLEDGE OF SELF CARE AFTER HOSPITALIZATION Description Family will be able to explain pt care needs and demonstrate ability to perform using handouts and resources independently  Outcome: Progressing

## 2018-11-30 NOTE — Progress Notes (Signed)
Speech Language Pathology Daily Session Note  Patient Details  Name: Courtney Terry MRN: [id number] Date of Birth: 04-29-1961  Today's Date: 11/30/2018 SLP Individual Time: 1500-1530 SLP Individual Time Calculation (min): 30 min  Short Term Goals: Week 1: SLP Short Term Goal 1 (Week 1): Patient will consume current diet with minimal overt s/s of aspiration and supervision verbal cues for use of swallowing compensatory srategies.  SLP Short Term Goal 2 (Week 1): Patient will consume trials of thin liquids without overt s/s of aspiration over 3 sessions prior to upgrade with supervision verbal cues.  SLP Short Term Goal 3 (Week 1): Patient will consume trials of Dys. 3 textures with efficient mastication and complete oral clearance over 2 sessions prior to upgrade.  SLP Short Term Goal 4 (Week 1): Patient will demonstrate sustained attention to tasks for 10 minutes with Mod A verbal cues for redirection.  SLP Short Term Goal 5 (Week 1): Patient will utilize multimodal communication (gestures, mouthing words, communication board) to express wants/needs with Mod A mulitmodal cues.  SLP Short Term Goal 6 (Week 1): Patient will tolerate PMSV for ~1 minute with all vitals remaining WFL without signs of distress with Max A multimodal cues.   Skilled Therapeutic Interventions:Skilled ST services focused on education and swallow skills. SLP facilitated PO consumption of dys 3 textured trials, pt consumed large amounts, putting whole graham cracker in oral cavity, however demonstrated appropriate oral clearances and no s/s aspiration. Pt's vital signs remained stable throughout session, oxygen 95 and 80 HR. Pt required mod A verbal cues in 5 minute intervals, intermittently closing eyes. SLP educated pt's sister, about trach, PMVS and use of alphabet spelling board, answered all questions to satisfaction. Pt was left in room with call bell within reach and bed alarm set. Recommend to continue skilled ST  services.      Pain Pain Assessment Pain Scale: 0-10 Pain Score: 0-No pain  Therapy/Group: Individual Therapy  Sole Lengacher  [facility] 11/30/2018, 3:45 PM

## 2018-11-30 NOTE — Progress Notes (Signed)
Physical Therapy Session Note  Patient Details  Name: Courtney Terry MRN: [id number] Date of Birth: Sep 20, 1961  Today's Date: 11/30/2018 PT Individual Time:1000  - 1100 PT Minutes: 60 min     Short Term Goals: Week 1:  PT Short Term Goal 1 (Week 1): Pt will performed sit<>stand with mod assist  PT Short Term Goal 2 (Week 1): Pt will perform bed mobility with supevision assist  PT Short Term Goal 3 (Week 1): Pt will performed bed<>chair with stand pivot and mod assist   PT Short Term Goal 4 (Week 1): Pt will abmulate 11ft with mod assist and LRAD  PT Short Term Goal 5 (Week 1): Pt will propell WC 24ft with min assist   Skilled Therapeutic Interventions/Progress Updates:    Pt received semi-reclined in bed, agreeable to PT session. See pain details below, RN provides pain medication for abdominal pain during session. Mod A to don pants in supine. Supine to sit with min to mod A for trunk control. Min A to don shirt in sitting. Squat pivot transfer bed to w/c with min A to the R. Sit to stand in // bars with min A. Pt requires max encouragement for standing and declines to perform any further standing after one stand. Pt unable to verbalize why she no longer wishes to participate in standing activity. Pt is able to nod "yes" that she ambulated prior to admission. Education with pt that purpose of working on standing is to progress back to ambulating. Seated BLE therex x 10 reps: marches, LAQ, heel/toe raises with max encouragement. Squat pivot transfer back to bed with mod A. Sit to supine mod A for BLE management. Pt on 4-5L O2 throughout session via trach, SpO2 97% and above. Pt left supine in bed with needs in reach, bed alarm in place.  Therapy Documentation Precautions:  Precautions Precautions: Fall Precaution Comments: Trach collar, supplemental 02 Restrictions Weight Bearing Restrictions: No Vital Signs: Therapy Vitals Pulse Rate: 75 Resp: 18 BP: 112/61 Patient Position (if  appropriate): Sitting Oxygen Therapy SpO2: 98 % O2 Device: Tracheostomy Collar O2 Flow Rate (L/min): 5 L/min FiO2 (%): 28 % Pain: Pain Assessment Pain Scale: 0-10 Pain Score: 0-No pain    Therapy/Group: Individual Therapy   Excell Seltzer, PT, DPT  11/30/2018, 3:58 PM
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
## 2018-11-30 NOTE — IPOC Note (Addendum)
Overall Plan of Care [facility]) Patient Details Name: MOSELLE RISTER MRN: [id number] DOB: [birth year]  Admitting Diagnosis: <principal problem not specified>  Hospital Problems: Active Problems:   Metabolic encephalopathy     Functional Problem List: Nursing Bladder, Bowel, Endurance, Medication Management, Safety, Skin Integrity  PT Behavior, Balance, Endurance, Motor, Safety, Sensory, Skin Integrity  OT Balance, Nutrition, Cognition, Safety, Endurance, Motor  SLP Cognition, Linguistic, Nutrition  TR         Basic ADL's: OT Eating, Grooming, Bathing, Dressing, Toileting     Advanced  ADL's: OT Simple Meal Preparation     Transfers: PT Bed Mobility, Bed to Chair, Car, Furniture, Floor  OT Toilet, Tub/Shower     Locomotion: PT Stairs, Emergency planning/management officer, Ambulation     Additional Impairments: OT None  SLP Swallowing, Communication, Social Cognition expression Social Interaction, Problem Solving, Memory, Attention, Awareness  TR      Anticipated Outcomes Item Anticipated Outcome  Self Feeding Supervision/cuing   Swallowing  Supervision with least restrictive diet    Basic self-care  Supervision-Min A  Toileting  Supervision/cuing    Bathroom Transfers Supervision/cuing   Bowel/Bladder  manage with min assist  Transfers  Min assist with LRAD   Locomotion  Min assist at ambulatory level with LRAD and supervision assist WC mobility   Communication  Min A   Cognition  Mod A   Pain     Safety/Judgment  maintain with min assist   Therapy Plan: PT Intensity: Minimum of 1-2 x/day ,45 to 90 minutes PT Frequency: 5 out of 7 days PT Duration Estimated Length of Stay: 14-16 days  OT Intensity: Minimum of 1-2 x/day, 45 to 90 minutes OT Frequency: 5 out of 7 days OT Duration/Estimated Length of Stay: 12-14 days SLP Intensity: Minumum of 1-2 x/day, 30 to 90 minutes SLP Frequency: 3 to 5 out of 7 days SLP Duration/Estimated Length of Stay: 14-16 days     Team  Interventions: Nursing Interventions Patient/Family Education, Bowel Management, Skin Care/Wound Management, Dysphagia/Aspiration Precaution Training, Psychosocial Support, Bladder Management, Disease Management/Prevention, Medication Management, Discharge Planning  PT interventions Balance/vestibular training, Ambulation/gait training, Community reintegration, Discharge planning, Cognitive remediation/compensation, DME/adaptive equipment instruction, Disease management/prevention, Functional electrical stimulation, Neuromuscular re-education, Functional mobility training, Pain management, Patient/family education, Psychosocial support, Splinting/orthotics, Stair training, Skin care/wound management, Therapeutic Exercise, UE/LE Strength taining/ROM, Therapeutic Activities, UE/LE Coordination activities, Wheelchair propulsion/positioning, Visual/perceptual remediation/compensation  OT Interventions Training and development officer, Community reintegration, Disease mangement/prevention, Neuromuscular re-education, Barrister's clerk education, Self Care/advanced ADL retraining, Therapeutic Exercise, UE/LE Coordination activities, Wheelchair propulsion/positioning, UE/LE Strength taining/ROM, Therapeutic Activities, Psychosocial support, Pain management, Functional mobility training, Discharge planning, Cognitive remediation/compensation, DME/adaptive equipment instruction  SLP Interventions Cognitive remediation/compensation, Environmental controls, Internal/external aids, Speech/Language facilitation, Therapeutic Activities, Patient/family education, Functional tasks, Dysphagia/aspiration precaution training, Cueing hierarchy  TR Interventions    SW/CM Interventions Discharge Planning, Psychosocial Support, Patient/Family Education   Barriers to Discharge MD  Medical stability, Therapist, nutritional and substance abuse, poor nutrition  Nursing      PT Inaccessible home environment, Decreased caregiver support, Medical stability,  Home environment access/layout, Therapist, nutritional, Lack of/limited family support, Insurance for SNF coverage, Medication compliance, Behavior    OT Decreased caregiver support, Medical stability, Home environment access/layout, Incontinence, Lack of/limited family support, Insurance underwriter for SNF coverage, Therapist, nutritional, Nutrition means    SLP Decreased caregiver support, [place]       Team Discharge Planning: Destination: PT-Skilled Nursing Facility (SNF) ,[place] (SNF)(Family unsure if d/c home vs SNF) , SLP-Home Projected Follow-up:  PT-Skilled nursing facility, OT-  Skilled nursing facility, 24 hour supervision/assistance, SLP-24 hour supervision/assistance, Home Health SLP, Outpatient SLP Projected Equipment Needs: PT-To be determined, OT- To be determined, SLP-To be determined Equipment Details: PT- , OT-  Patient/family involved in discharge planning: PT- Patient,  OT-Patient unable/family or caregiver not available, SLP-Patient, Family member/caregiver  MD ELOS: 10-13d Medical Rehab Prognosis:  Good Assessment:  58 year old right-handed female history of bipolar disorder, seizure disorder maintained on valproate,breast cancer with lumpectomy, chronic hypotension maintained on midodrine as well as Florinef, polysubstance abuse, COPD(FEV1 25%)with 2 L oxygen at home as well as chronic prednisone followed by Dr.Wert, tobacco abuse. Patient with recent hospitalizations for COPD exacerbation for acute on chronic hypoxemic and hypercarbic respiratory failure. Per chart review patient lives with her roommate was independent prior to admission. Presented 11/08/2018 for increasing shortness of breath decreased level of alertness requiring intubation. Urine drug screen positive for cocaine. CTA completed in the emergency room negative for pulmonary emboli. Placed on IV Solu-Medrol. EEG moderately global encephalopathy and negative for seizure. She remained on valproate for seizure prophylaxis. Cranial  CT scan negative. Patient remained intubated through 11/19/2018 with tracheostomy completed per Dr. Nelda Marseille 11/19/2018. Maintained on subcutaneous heparin for DVT prophylaxis. Patient initially with nasogastric tube for nutritional support diet advanced to a dysphagia #2 nectar thick liquid   See Team Conference Notes for weekly updates to the plan of care

## 2018-12-01 ENCOUNTER — Inpatient Hospital Stay (HOSPITAL_COMMUNITY): Payer: Medicaid Other | Admitting: Occupational Therapy

## 2018-12-01 ENCOUNTER — Inpatient Hospital Stay (HOSPITAL_COMMUNITY): Payer: Medicaid Other | Admitting: Physical Therapy

## 2018-12-01 ENCOUNTER — Inpatient Hospital Stay (HOSPITAL_COMMUNITY): Payer: Medicaid Other

## 2018-12-01 LAB — GLUCOSE, CAPILLARY
GLUCOSE-CAPILLARY: 107 mg/dL — AB (ref 70–99)
Glucose-Capillary: 100 mg/dL — ABNORMAL HIGH (ref 70–99)
Glucose-Capillary: 106 mg/dL — ABNORMAL HIGH (ref 70–99)
Glucose-Capillary: 85 mg/dL (ref 70–99)
Glucose-Capillary: 88 mg/dL (ref 70–99)
Glucose-Capillary: 89 mg/dL (ref 70–99)
Glucose-Capillary: 99 mg/dL (ref 70–99)

## 2018-12-01 MED ORDER — ADULT MULTIVITAMIN W/MINERALS CH
1.0000 | ORAL_TABLET | Freq: Every day | ORAL | Status: DC
  Administered 2018-12-01 – 2018-12-14 (×10): 1 via ORAL
  Filled 2018-12-01 (×17): qty 1

## 2018-12-01 MED ORDER — ENOXAPARIN SODIUM 30 MG/0.3ML ~~LOC~~ SOLN
30.0000 mg | SUBCUTANEOUS | Status: DC
  Administered 2018-12-01 – 2018-12-13 (×13): 30 mg via SUBCUTANEOUS
  Filled 2018-12-01 (×13): qty 0.3

## 2018-12-01 NOTE — Progress Notes (Signed)
Initial Nutrition Assessment  DOCUMENTATION CODES:   Severe malnutrition in context of chronic illness, Underweight  INTERVENTION:   - Provided feeding assistance at all meals  - Vital Cuisine Shake TID with meals, each supplement provides 520 kcal and 22 grams of protein  - MVI with minerals daily  - Encourage adequate PO intake  NUTRITION DIAGNOSIS:   Severe Malnutrition related to chronic illness (COPD, hx breast cancer) as evidenced by severe fat depletion, severe muscle depletion, percent weight loss (25% weight loss in 10 months).  GOAL:   Patient will meet greater than or equal to 90% of their needs  MONITOR:   PO intake, Supplement acceptance, Diet advancement, Skin, Weight trends  REASON FOR ASSESSMENT:   Other (underweight BMI)    ASSESSMENT:   58 year old female with PMH significant for bipolar disorder, seizure disorder, breast cancer with lumpectomy, chronic hypotension, polysubstance abuse, COPD, tobacco abuse. Pt with recent hospitalizations for COPD exacerbation for acute on chronic hypoxemic and hypercarbic respiratory failure. Pt presented 11/08/18 for increasing SOB and decreased level of alertness requiring intubation. Urine drug screen positive for cocaine. Pt remained intubated through 11/19/18 with tracheostomy completed on 02/13. Pt required Cortrak tube for nutrition support but diet advanced to a dysphagia 2 with nectar-thick liquid and Cortrak removed prior to admission to CIR.  Spoke with pt and long-time home aid (per aid, 11 years) at bedside. Pt asleep on trach collar but awoke when she heard RD talking about graham crackers. Pt requesting graham crackers.  Pt's aid states that pt did "okay" with breakfast this morning and that he has noticed her PO intake improving. Per pt's aid, pt prefers to have him feed her and does require feeding assistance at all meals.  Pt's aid reports that pt's appetite was significant decreased in the 1-2 months PTA.  Per pt's aid, pt typically consumed 1 meal daily that would include a peanut butter and jelly sandwich or "just a few bites of chicken and potatoes." Pt's aid shares that pt mostly slept during the day.  Pt's aid reports pt's UBW as 145 lbs but that it has been "years" since she weighed this amount. Pt's aid states that pt weighed 91 lbs on admission to the hospital. Reviewed weight history in chart. Per weight readings from previous encounters, pt has experienced a 14.7 kg weight loss over the last 10 months (weight was 58.7 kg on 02/01/18). This is a 25% weight loss which is significant for timeframe.  Reviewed RD notes from previous admissions. Pt has previously been diagnosed with severe malnutrition. After completion of NFPE, RD notes that this diagnosis persists.  Meal Completion: 25-100% x last 8 recorded meals (average 74%)  Medications reviewed and include: Pepcid, folic acid, SSI q 4 hours, thiamine, Prednisone  Labs reviewed: magnesium 1.6 (L) on 2/21 CBG's: 89, 88, 107, 85, 98, 82 x 24 hours  NUTRITION - FOCUSED PHYSICAL EXAM:    Most Recent Value  Orbital Region  Severe depletion  Upper Arm Region  Severe depletion  Thoracic and Lumbar Region  Severe depletion  Buccal Region  Severe depletion  Temple Region  Severe depletion  Clavicle Bone Region  Severe depletion  Clavicle and Acromion Bone Region  Severe depletion  Scapular Bone Region  Severe depletion  Dorsal Hand  Severe depletion  Patellar Region  Severe depletion  Anterior Thigh Region  Severe depletion  Posterior Calf Region  Severe depletion  Edema (RD Assessment)  None  Hair  Reviewed  Eyes  Reviewed  Mouth  Reviewed  Skin  Reviewed  Nails  Reviewed       Diet Order:   Diet Order            DIET DYS 2 Room service appropriate? Yes; Fluid consistency: Nectar Thick  Diet effective now              EDUCATION NEEDS:   Not appropriate for education at this time  Skin:  Skin Assessment: Skin  Integrity Issues: Stage II/Stage III: mid perineum  Last BM:  2/25 medium type 6  Height:   Ht Readings from Last 1 Encounters:  11/27/18 5\' 10"  (1.778 m)    Weight:   Wt Readings from Last 1 Encounters:  12/01/18 44 kg    Ideal Body Weight:     BMI:  Body mass index is 13.92 kg/m.  Estimated Nutritional Needs:   Kcal:  1650-1850  Protein:  85-100 grams  Fluid:  >/= 1.7 L    Gaynell Face, MS, RD, LDN Inpatient Clinical Dietitian Pager: [phone] Weekend/After Hours: [phone]

## 2018-12-01 NOTE — Progress Notes (Signed)
Social Work Patient ID: Courtney Terry, female   DOB: [birth year], 58 y.o.   MRN: [id number]     Follow up with pt's sister, [name], this morning who reports that she has spoken with family members and confirming that they cannot provide 24/7 care for pt at home and wish to change d/c plan to SNF.  Have alerted tx team and will attempt to discuss with pt.  Need to speak further with medical team about timing that might be anticipated for removal of trach???? As this will affect SNF placement.  Arely Tinner, LCSW

## 2018-12-01 NOTE — Progress Notes (Signed)
Physical Therapy Note  Patient Details  Name: Courtney Terry MRN: [id number] Date of Birth: 04/06/1961 Today's Date: 12/01/2018   Attempted to see patient for scheduled therapy session. Pt semi-reclined in bed asleep, able to arouse pt but pt keeps shaking her head "no" when asked about participating in therapy session. Pt unable to verbalize why she is refusing, does report a headache that is not rated and pt declines any intervention. Encouraged pt to participate in bed-level therapy if she is going to refuse to get out of bed, pt refuses. Education with patient about importance of participation in therapy session for improving endurance and that if she stays in bed she is going to feel more tired and it will be even more difficult when she attempts it next time. Pt is not receptive to education. Pt left semi-reclined in bed with needs in reach, bed alarm in place. Will continue per POC.    Excell Seltzer, PT, DPT  12/01/2018, 2:49 PM

## 2018-12-01 NOTE — Plan of Care (Signed)
  Problem: Consults Goal: RH GENERAL PATIENT EDUCATION Description See Patient Education module for education specifics. Outcome: Progressing   Problem: RH BOWEL ELIMINATION Goal: RH STG MANAGE BOWEL WITH ASSISTANCE Description STG Manage Bowel with min Assistance.  Outcome: Progressing   Problem: RH SKIN INTEGRITY Goal: RH STG SKIN FREE OF INFECTION/BREAKDOWN Description With min assist  Outcome: Progressing Goal: RH STG MAINTAIN SKIN INTEGRITY WITH ASSISTANCE Description STG Maintain Skin Integrity With min Assistance.  Outcome: Progressing   Problem: RH SAFETY Goal: RH STG ADHERE TO SAFETY PRECAUTIONS W/ASSISTANCE/DEVICE Description STG Adhere to Safety Precautions With cues/reminders Assistance/Device.  Outcome: Progressing   Problem: RH KNOWLEDGE DEFICIT GENERAL Goal: RH STG INCREASE KNOWLEDGE OF SELF CARE AFTER HOSPITALIZATION Description Family will be able to explain pt care needs and demonstrate ability to perform using handouts and resources independently  Outcome: Progressing

## 2018-12-01 NOTE — Progress Notes (Signed)
[facility] PHYSICAL MEDICINE & REHABILITATION PROGRESS NOTE   Subjective/Complaints: Asleep HOH, awakens to touch, no resp distress noted  ROS- denies CP, SOB,N/V/D Objective:   No results found. Recent Labs    11/30/18 0432  WBC 11.0*  HGB 10.7*  HCT 35.3*  PLT 264   Recent Labs    11/30/18 0432  NA 135  K 3.5  CL 98  CO2 29  GLUCOSE 87  BUN 9  CREATININE 0.33*  CALCIUM 8.9    Intake/Output Summary (Last 24 hours) at 12/01/2018 0757 Last data filed at 11/30/2018 2130 Gross per 24 hour  Intake 238 ml  Output -  Net 238 ml     Physical Exam: Vital Signs Blood pressure 135/88, pulse 78, temperature 98.6 F (37 C), temperature source Oral, resp. rate 18, height 5' 10"  (1.778 m), weight 44 kg, SpO2 95 %. HEENT:  Trach clean General: No acute distress, cachectic Mood and affect are appropriate Heart: Regular rate and rhythm no rubs murmurs or extra sounds Lungs: Clear to auscultation, breathing unlabored, no rales or wheezes Abdomen: Positive bowel sounds, soft nontender to palpation, nondistended Extremities: No clubbing, cyanosis, or edema Skin: No evidence of breakdown, no evidence of rash Neurologic: Cranial nerves II through XII intact, motor strength is 4/5 in bilateral deltoid, bicep, tricep, grip, hip flexor, knee extensors, ankle dorsiflexor and plantar flexor  Musculoskeletal: Full range of motion in all 4 extremities. No joint swelling    Assessment/Plan: 1. Functional deficits secondary to debilitiy from respiratory failure which require 3+ hours per day of interdisciplinary therapy in a comprehensive inpatient rehab setting.  Physiatrist is providing close team supervision and 24 hour management of active medical problems listed below.  Physiatrist and rehab team continue to assess barriers to discharge/monitor patient progress toward functional and medical goals  Care Tool:  Bathing    Body parts bathed by patient: Right arm, Left arm,  Chest, Abdomen, Right upper leg, Left upper leg, Face   Body parts bathed by helper: Front perineal area, Buttocks, Face     Bathing assist Assist Level: Moderate Assistance - Patient 50 - 74%     Upper Body Dressing/Undressing Upper body dressing   What is the patient wearing?: Pull over shirt    Upper body assist Assist Level: Maximal Assistance - Patient 25 - 49%    Lower Body Dressing/Undressing Lower body dressing      What is the patient wearing?: Pants     Lower body assist Assist for lower body dressing: Maximal Assistance - Patient 25 - 49%     Toileting Toileting Toileting Activity did not occur (Clothing management and hygiene only): N/A (no void or bm)  Toileting assist Assist for toileting: Dependent - Patient 0%     Transfers Chair/bed transfer  Transfers assist     Chair/bed transfer assist level: Moderate Assistance - Patient 50 - 74%     Locomotion Ambulation   Ambulation assist   Ambulation activity did not occur: Safety/medical concerns          Walk 10 feet activity   Assist  Walk 10 feet activity did not occur: Refused        Walk 50 feet activity   Assist Walk 50 feet with 2 turns activity did not occur: Safety/medical concerns         Walk 150 feet activity   Assist Walk 150 feet activity did not occur: Safety/medical concerns         Walk 10 feet  on uneven surface  activity   Assist Walk 10 feet on uneven surfaces activity did not occur: Safety/medical concerns         Wheelchair     Assist   Type of Wheelchair: Manual Wheelchair activity did not occur: Refused         Wheelchair 50 feet with 2 turns activity    Assist    Wheelchair 50 feet with 2 turns activity did not occur: Refused       Wheelchair 150 feet activity     Assist Wheelchair 150 feet activity did not occur: Safety/medical concerns        Medical Problem List and Plan: 1.Debility and metabolic  encephalopathysecondary to acute on chronic respiratory failure status post tracheostomy 11/19/2018.Pulmonary services to follow-up on plan decannulation CIR PT, OT,SLPteam conf in am, Team conference today please see physician documentation under team conference tab, met with team face-to-face to discuss problems,progress, and goals. Formulized individual treatment plan based on medical history, underlying problem and comorbidities 2.  DVT prophyllaxis Subcutaneous heparin.no renal failure , switch to Lovenox 3. Pain Management:Tylenol as needed 4. Mood/bipolar disorder:Prozac 40 mg daily for mood stabilization, Klonopin 0.5 mg 3 times a day as needed for anxiety or agitation -monitor sleep patterns -sleep chart 5. Neuropsych: This patientis notcapable of making decisions on herown behalf. 6. Skin/Wound Care:Routine skin checks 7. Fluids/Electrolytes/Nutrition:Routine in and out's with follow-up chemistrieson admit 8. Chronic hypotension. Patient had been on ProAmatine 10 mg 3 times a day as well as Florinef prior to admission.Resume as neededpendng tolerance of therapy Vitals:   12/01/18 0326 12/01/18 0500  BP:  135/88  Pulse: 75 78  Resp: 19 18  Temp:  98.6 F (37 C)  SpO2: 94% 95%  controlled 2/25 9.COPD with history of tobacco abuse. Patient on 2 L oxygen at home.Continue chronic prednisone. Follow-up pulmonary services Dr.Wert.Continue nebulizersas directed 10. Seizure disorder. ContinueDepakoteas prior to admission.. EEG negative No clinical seizure activity 11.Hypothyroidism. Synthroid 12. Dysphagia. Dysphagia #2 nectar liquids. Follow-up speech therapy. Monitor hydration. 13. Polysubstance abuse. UDS positive for cocaine. Provide counseling 14. History of breast cancer with lumpectomy. Patient on Arimidex 1 mg daily prior to admission.  15.  Leukocytosis with chronic steroid use, afeb  LOS: 4 days A FACE TO FACE EVALUATION  WAS PERFORMED  Charlett Blake 12/01/2018, 7:57 AM

## 2018-12-01 NOTE — Care Management (Signed)
[facility] Individual Statement of Services  Patient Name:  Courtney Terry  Date:  12/01/2018  Welcome to the [facility].  Our goal is to provide you with an individualized program based on your diagnosis and situation, designed to meet your specific needs.  With this comprehensive rehabilitation program, you will be expected to participate in at least 3 hours of rehabilitation therapies Monday-Friday, with modified therapy programming on the weekends.  Your rehabilitation program will include the following services:  Physical Therapy (PT), Occupational Therapy (OT), Speech Therapy (ST), 24 hour per day rehabilitation nursing, Therapeutic Recreaction (TR), Neuropsychology, Case Management (Social Worker), Rehabilitation Medicine, Nutrition Services and Pharmacy Services  Weekly team conferences will be held on Tuesdays to discuss your progress.  Your Social Worker will talk with you frequently to get your input and to update you on team discussions.  Team conferences with you and your family in attendance may also be held.  Expected length of stay: 14-16 days   Overall anticipated outcome: minimal assistance  Depending on your progress and recovery, your program may change. Your Social Worker will coordinate services and will keep you informed of any changes. Your Social Worker's name and contact numbers are listed  below.  The following services may also be recommended but are not provided by the [facility] will be made to provide these services after discharge if needed.  Arrangements include referral to agencies that provide these services.  Your insurance has been verified to be:  Medicaid Your primary doctor is:  Dr. Emilee Hero - [place]  Pertinent information will be shared with your  doctor and your insurance company.  Social Worker:  [place], [place] or ([phone]   Information discussed with and copy given to patient by: Lennart Pall, 12/01/2018, 4:16 PM

## 2018-12-01 NOTE — Progress Notes (Signed)
Speech Language Pathology Daily Session Note  Patient Details  Name: Courtney Terry MRN: [id number] Date of Birth: May 08, 1961  Today's Date: 12/01/2018 SLP Individual Time: 2992-4268 SLP Individual Time Calculation (min): 30 min  Short Term Goals: Week 1: SLP Short Term Goal 1 (Week 1): Patient will consume current diet with minimal overt s/s of aspiration and supervision verbal cues for use of swallowing compensatory srategies.  SLP Short Term Goal 2 (Week 1): Patient will consume trials of thin liquids without overt s/s of aspiration over 3 sessions prior to upgrade with supervision verbal cues.  SLP Short Term Goal 3 (Week 1): Patient will consume trials of Dys. 3 textures with efficient mastication and complete oral clearance over 2 sessions prior to upgrade.  SLP Short Term Goal 4 (Week 1): Patient will demonstrate sustained attention to tasks for 10 minutes with Mod A verbal cues for redirection.  SLP Short Term Goal 5 (Week 1): Patient will utilize multimodal communication (gestures, mouthing words, communication board) to express wants/needs with Mod A mulitmodal cues.  SLP Short Term Goal 6 (Week 1): Patient will tolerate PMSV for ~1 minute with all vitals remaining WFL without signs of distress with Max A multimodal cues.   Skilled Therapeutic Interventions: 1# Skilled ST services focused on education, swallow and speech skills. SLP facilitated PO consumption of dys 2 and NTL breakfast tray, pt required total-max A with self-feeding. Pt's friend "Courtney Terry" was present and pt indicated by head nods, she wanted him to feed her. Courtney Terry required min A verbal cues for small bolus and to allow pt time to swallow. SLP educated pt and Courtney Terry on appropriate diet textures, providing handout, was instructed to not bring in food for pt and all food needs to assessed by nursing staff. SLP did NOT sign off Courtney Terry to provide supervision during meals and Courtney Terry stated agreement. SLP provided dys 3 trial, pt  demonstrated appropriate mastication, oral clearance and no overt s/s aspiration. SLP recommends dys 3 trial tray in future sessions to assess tolerance in large quanities. SLP facilitated tolerance of PMSV for 30 seconds, pt demonstrated slight distress and O2 % dropped from 95 to 93, however pt's 02 % remained 94-93 throughout the rest of the session. Pt was unable to provide phonation with PMSV in place. Pt demonstrated expression of wants/needs with gestures and in response to yes/no questions with mod A verbal cues. Pt was left in room with call bell within reach, Courtney Terry in room and bed alarm set. SLP reccomends to continue skilled services.  2# Skilled ST services focused on education and speech skills. Upon entering room [place] and pt were consuming a hamburger brought in from [place]. Courtney Terry initial denied providing food, however eventual stated " we were caught" and stated he will never do it again. SLP provided education pertaining to diet and risk of apiration, as well as notified pt's nurse upon leaving. SLP recommends that Courtney Terry, pt' friend should not be in the room unsupervised. SLP facilitated tolerance of PMSV for 30 seconds x2 trials, pt's O2 level remained stable (94%), however pt demonstrated signs of distress/anexity and HR increased from 76 to 80, gesturing for PMSV to be removed and decline any further trials during this session. SLP facilitated comprehension of basic yes/no questions, demonstrating 100% accuracy and complex yes/no question 70% accuracy. SLP facilitated utilization of letter board to express wants/needs, pt demonstarted ability to spell full name with min A verbal cues. Pt was left in room with call bell within reach and  bed alarm set. SLP reccomends to continue skilled services.     Pain Pain Assessment Pain Scale: 0-10 Pain Score: 0-No pain  Therapy/Group: Individual Therapy  Burnett Spray  [facility] 12/01/2018, 4:09 PM

## 2018-12-01 NOTE — Progress Notes (Signed)
Occupational Therapy Session Note  Patient Details  Name: Courtney Terry MRN: [id number] Date of Birth: 03/07/1961  Today's Date: 12/01/2018 OT Individual Time: 6387-5643 OT Individual Time Calculation (min): 42 min    Short Term Goals: Week 1:  OT Short Term Goal 1 (Week 1): Pt will thread 1 LE into pants with supervision  OT Short Term Goal 2 (Week 1): Pt will complete sit<stand for bathing with Min A and LRAD  OT Short Term Goal 3 (Week 1): Pt will complete toilet transfer with Min A and LRAD  Skilled Therapeutic Interventions/Progress Updates:    Patient in bed upon arrival.  She denies pain.  Able to ask questions with letter board.  She requires ongoing encouragement t/o session to participate in light activity.  LB dressing with mod A.  Supine to/from SSP at edge of bed with min A x2.  She tolerates unsupported sitting with CG/min A for 5 minutes x2.  She participated in nail care for a few moments but otherwise declined all other functional activities or exercises.  She returned to supine position in bed at close of session with bed alarm set, call bell in reach and saturation monitor on.    Therapy Documentation Precautions:  Precautions Precautions: Fall Precaution Comments: Trach collar, supplemental 02 Restrictions Weight Bearing Restrictions: No General:   Vital Signs: Therapy Vitals Pulse Rate: 83 Resp: 18 Patient Position (if appropriate): Lying Oxygen Therapy SpO2: 93 % O2 Device: Tracheostomy Collar O2 Flow Rate (L/min): 5 L/min FiO2 (%): 28 % Pain: Pain Assessment Pain Scale: 0-10 Pain Score: 0-No pain   Praxis   Exercises:   Other Treatments:     Therapy/Group: Individual Therapy  Carlos Levering 12/01/2018, 12:34 PM

## 2018-12-01 NOTE — Patient Care Conference (Signed)
Inpatient RehabilitationTeam Conference and Plan of Care Update Date: 12/01/2018   Time: 10:15 AM    Patient Name: Courtney Terry      Medical Record Number: [id number]  Date of Birth: Feb 09, 1961 Sex: Female         Room/Bed: 4W03C/4W03C-01 Payor Info: Payor: MEDICAID [place] / Plan: MEDICAID [place] ACCESS / Product Type: *No Product type* /    Admitting Diagnosis: debility  resp failure  Admit Date/Time:  11/27/2018  3:56 PM Admission Comments: No comment available   Primary Diagnosis:  <principal problem not specified> Principal Problem: <principal problem not specified>  Patient Active Problem List   Diagnosis Date Noted  . Metabolic encephalopathy 57/84/6962  . Tracheostomy status ([place])   . Acute respiratory failure with hypoxia and hypercapnia ([place]) 11/08/2018  . Severely underweight adult 09/13/2018  . COPD with acute exacerbation ([place]) 09/12/2018  . Syncope 09/12/2018  . Tobacco use disorder 05/01/2018  . Depression 01/26/2018  . CHF (congestive heart failure) ([place]) 11/11/2017  . PID (acute pelvic inflammatory disease) 11/11/2017  . Seizures ([place])   . Hypertension   . Palliative care encounter   . Goals of care, counseling/discussion   . Hypoxia   . Rhinovirus infection 09/30/2016  . Hypothyroidism 09/29/2016  . Bipolar I disorder ([place]) 09/29/2016  . Acute and chronic respiratory failure with hypercapnia ([place]) 09/28/2016  . Polysubstance abuse ([place]) 08/13/2016  . Cocaine abuse ([place]) 08/13/2016  . Schizophrenia ([place]) 04/23/2016  . COPD (chronic obstructive pulmonary disease) ([place]) 04/23/2016  . Chronic respiratory failure with hypoxia ([place]) 03/29/2016  . Acute on chronic respiratory failure ([place]) 02/28/2016  . Protein-calorie malnutrition, severe 02/27/2016  . Elevated troponin   . History of breast cancer 10/20/2015  . Exposure of implanted prstht mtrl to surrnd org/tiss, init 06/19/2015  . Complication of internal breast prosthesis 06/19/2015  . Acquired absence  of breast and nipple 06/06/2015  . H/O right mastectomy 06/06/2015  . COPD exacerbation ([place]) 06/29/2014  . COPD GOLD IV D 02/27/2013  . Malignant neoplasm of upper-outer quadrant of female breast ([place]) 01/31/2012  . Epilepsy ([place]) 12/02/2007    Expected Discharge Date: Expected Discharge Date: (SNF)  Team Members Present: Physician leading conference: Dr. Delice Lesch Social Worker Present: Lennart Pall, LCSW Nurse Present: Mohammed Kindle, LPN PT Present: Lavone Nian, PT OT Present: Other (comment)(Cayci Rosana Hoes, OT) SLP Present: Charolett Bumpers, SLP PPS Coordinator present : Gunnar Fusi     Current Status/Progress Goal Weekly Team Focus  Medical   Poor appetite, hard of hearing, trach is still present  Decannulate prior to discharge  Consult with pulmonary about decannulation   Bowel/Bladder   Continent of bowel and bladder, with incontinent episodes  Regain regular pattern of bowel and bladder regimen  Timed toileting and assist with toileting as needed   Swallow/Nutrition/ Hydration   dys 2 and NTL  Sup A  dys 3 trials and Thin trials,    ADL's   Max A for bathing/dressing EOB sit<stand, Mod A squat or stand pivot BSC transfers, Total A for toileting   Supervision-Min A overall   Activity tolerance, balance, functional transfers, ADL retraining   Mobility   min to mod A transfers, has refused w/c mobility or gait  min A overall  OOB tolerance, standing tolerance, participation   Communication   Max-Mod A  Sup A  wants/needs, letter board, yes/no/gesturing    Safety/Cognition/ Behavioral Observations  Max-Mod A  MIn A  basic problem solving, sustianed attention, recall with external aid,  Pain   no c/o pain so far this shift  Remain free of pain  Assess pain every shift and as needed   Skin   Ecchymosis to bilateral arms/elbows, MASD to buttocks/sacrum, Stage I and Stage II to buttocks, abrasion to right flank area  No s/sx of infection, no further skin issues  Assess  skin every shift and as needed    Rehab Goals Patient on target to meet rehab goals: Yes *See Care Plan and progress notes for long and short-term goals.     Barriers to Discharge  Current Status/Progress Possible Resolutions Date Resolved   Physician    Medical stability;Trach     No medical complications limiting inpatient rehab participation.  Continue rehab program see above      Nursing                  PT  Inaccessible home environment;Decreased caregiver support;Medical stability;Home environment access/layout;Trach;Lack of/limited family support;Insurance for SNF coverage;Medication compliance;Behavior                 OT Decreased caregiver support;Medical stability;Home Copywriter, advertising;Incontinence;Lack of/limited family support;Insurance for SNF coverage;Trach;Nutrition means                SLP Decreased caregiver support;Trach              SW [place];Lack of/limited family support;Decreased caregiver support will discuss further with MD about possibility that trach will be removed anytime soon i/e. < 2 weeks?            Discharge Planning/Teaching Needs:  Anticipate change to SNF as family cannot provide 24/7 assistance.  NA   Team Discussion:  Still with trach ?#6;  ST working on transition to [place] b/b with two ulcers on sacrum.  Mod  - total assistance overall.  Goals set for supervision to min assist.  SW reports significant social issues and, per sister this morning, plan has changed to SNF.  Revisions to Treatment Plan:  NA    Continued Need for Acute Rehabilitation Level of Care: The patient requires daily medical management by a physician with specialized training in physical medicine and rehabilitation for the following conditions: Daily direction of a multidisciplinary physical rehabilitation program to ensure safe treatment while eliciting the highest outcome that is of practical value to the patient.: Yes Daily medical management of patient  stability for increased activity during participation in an intensive rehabilitation regime.: Yes Daily analysis of laboratory values and/or radiology reports with any subsequent need for medication adjustment of medical intervention for : Neurological problems   I attest that I was present, lead the team conference, and concur with the assessment and plan of the team.   Vicie Cech 12/02/2018, 10:51 AM

## 2018-12-02 ENCOUNTER — Inpatient Hospital Stay (HOSPITAL_COMMUNITY): Payer: Medicaid Other | Admitting: Physical Therapy

## 2018-12-02 ENCOUNTER — Inpatient Hospital Stay (HOSPITAL_COMMUNITY): Payer: Medicaid Other | Admitting: Speech Pathology

## 2018-12-02 ENCOUNTER — Inpatient Hospital Stay (HOSPITAL_COMMUNITY): Payer: Medicaid Other

## 2018-12-02 LAB — GLUCOSE, CAPILLARY
Glucose-Capillary: 110 mg/dL — ABNORMAL HIGH (ref 70–99)
Glucose-Capillary: 80 mg/dL (ref 70–99)
Glucose-Capillary: 85 mg/dL (ref 70–99)
Glucose-Capillary: 86 mg/dL (ref 70–99)
Glucose-Capillary: 88 mg/dL (ref 70–99)
Glucose-Capillary: 94 mg/dL (ref 70–99)

## 2018-12-02 MED ORDER — SODIUM CHLORIDE 0.9% FLUSH
10.0000 mL | INTRAVENOUS | Status: DC | PRN
  Administered 2018-12-02: 30 mL

## 2018-12-02 NOTE — Plan of Care (Signed)
  Problem: Consults Goal: RH GENERAL PATIENT EDUCATION Description See Patient Education module for education specifics. Outcome: Progressing   Problem: RH SKIN INTEGRITY Goal: RH STG SKIN FREE OF INFECTION/BREAKDOWN Description With min assist  Outcome: Progressing Goal: RH STG MAINTAIN SKIN INTEGRITY WITH ASSISTANCE Description STG Maintain Skin Integrity With min Assistance.  Outcome: Progressing   Problem: RH SAFETY Goal: RH STG ADHERE TO SAFETY PRECAUTIONS W/ASSISTANCE/DEVICE Description STG Adhere to Safety Precautions With cues/reminders Assistance/Device.  Outcome: Progressing

## 2018-12-02 NOTE — Progress Notes (Signed)
Occupational Therapy Session Note  Patient Details  Name: Courtney Terry MRN: [id number] Date of Birth: [birth year]  Today's Date: 12/02/2018 OT Individual Time: 1300-1318 OT Individual Time Calculation (min): 18 min    Short Term Goals: Week 1:  OT Short Term Goal 1 (Week 1): Pt will thread 1 LE into pants with supervision  OT Short Term Goal 2 (Week 1): Pt will complete sit<stand for bathing with Min A and LRAD  OT Short Term Goal 3 (Week 1): Pt will complete toilet transfer with Min A and LRAD  Skilled Therapeutic Interventions/Progress Updates:     Pt received supine in bed with female friend present no c/o pain. When questioned pt reported her brief was wet however female friend reported it was not as "they had just changed her". Stressed the importance of checking even if brief was just changed and impact of soiled brief on skin integrity. Pt's brief was checked and she was in fact incontinent of urine. Pt completed rolling R and L with min A. Pt able to complete anterior peri hygiene with cueing for initiation. Brief donned total A. Pt declined any further participation in skilled OT intervention. Pt left supine with bed alarm set and female friend present.   Therapy Documentation Precautions:  Precautions Precautions: Fall Precaution Comments: Trach collar, supplemental 02 Restrictions Weight Bearing Restrictions: No General: General OT Amount of Missed Time: 12 Minutes PT Missed Treatment Reason: Patient unwilling to participate Pain: Pain Assessment Pain Scale: 0-10 Pain Score: 0-No pain   Therapy/Group: Individual Therapy  SHENG PRITZ 12/02/2018, 4:41 PM

## 2018-12-02 NOTE — Progress Notes (Signed)
Pt given prn Klonopin at HS to help with sleep, mild effects noted. Pt awakening for brief periods throughout the night. Frequent suctioning performed with thick white secretions noted, trach care provided this am. No distress noted throughout the night. Pt's female friend at bedside.

## 2018-12-02 NOTE — NC FL2 (Signed)
[place] LEVEL OF CARE SCREENING TOOL     IDENTIFICATION  Patient Name: Courtney Terry Birthdate: 1961-02-02 Sex: female Admission Date (Current Location): 11/27/2018  [place] and [place] Number:  Kathleen Argue [id number] [place] and Address:  [facility] [place]. [facility], [place] [address], [place], [place] [address]      Provider Number: [id number]  Attending Physician Name and Address:  Meredith Staggers, MD  Relative Name and Phone Number:       Current Level of Care: Other (Comment)(Acute Inpatient Rehab) Recommended Level of Care: [place] Prior Approval Number:    Date Approved/Denied:   PASRR Number:    Discharge Plan: SNF    Current Diagnoses: Patient Active Problem List   Diagnosis Date Noted  . Metabolic encephalopathy 17/61/6073  . Tracheostomy status ([place])   . Acute respiratory failure with hypoxia and hypercapnia ([place]) 11/08/2018  . Severely underweight adult 09/13/2018  . COPD with acute exacerbation ([place]) 09/12/2018  . Syncope 09/12/2018  . Tobacco use disorder 05/01/2018  . Depression 01/26/2018  . CHF (congestive heart failure) ([place]) 11/11/2017  . PID (acute pelvic inflammatory disease) 11/11/2017  . Seizures ([place])   . Hypertension   . Palliative care encounter   . Goals of care, counseling/discussion   . Hypoxia   . Rhinovirus infection 09/30/2016  . Hypothyroidism 09/29/2016  . Bipolar I disorder ([place]) 09/29/2016  . Acute and chronic respiratory failure with hypercapnia ([place]) 09/28/2016  . Polysubstance abuse ([place]) 08/13/2016  . Cocaine abuse ([place]) 08/13/2016  . Schizophrenia ([place]) 04/23/2016  . COPD (chronic obstructive pulmonary disease) ([place]) 04/23/2016  . Chronic respiratory failure with hypoxia ([place]) 03/29/2016  . Acute on chronic respiratory failure ([place]) 02/28/2016  . Protein-calorie malnutrition, severe 02/27/2016  . Elevated troponin   . History of breast cancer 10/20/2015  . Exposure of  implanted prstht mtrl to surrnd org/tiss, init 06/19/2015  . Complication of internal breast prosthesis 06/19/2015  . Acquired absence of breast and nipple 06/06/2015  . H/O right mastectomy 06/06/2015  . COPD exacerbation ([place]) 06/29/2014  . COPD GOLD IV D 02/27/2013  . 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 Epilepsy ([place]) 12/02/2007    Orientation RESPIRATION BLADDER Height & Weight     Self  Tracheostomy Incontinent Weight: 44.2 kg Height:  5\' 10"  (177.8 cm)  BEHAVIORAL SYMPTOMS/MOOD NEUROLOGICAL BOWEL NUTRITION STATUS      Incontinent Diet(Dys 2, thin liquids)  AMBULATORY STATUS COMMUNICATION OF NEEDS Skin   Limited Assist Non-Verbally PU Stage and Appropriate Care, Surgical wounds   PU Stage 2 Dressing: Daily                   Personal Care Assistance Level of Assistance  Bathing, Feeding, Dressing, Total care Bathing Assistance: Maximum assistance Feeding assistance: Limited assistance Dressing Assistance: Maximum assistance Total Care Assistance: Maximum assistance   Functional Limitations Info  Speech     Speech Info: Impaired    SPECIAL CARE FACTORS FREQUENCY  PT (By licensed PT), OT (By licensed OT), Speech therapy     PT Frequency: 5x/wk OT Frequency: 5x/wk     Speech Therapy Frequency: 5x/wk      Contractures Contractures Info: Not present    Additional Factors Info  Code Status, Allergies, Psychotropic, Insulin Sliding Scale Code Status Info: Full Allergies Info: Levaquin Psychotropic Info: see MAR Insulin Sliding Scale Info: prn       Current Medications (12/02/2018):  This is the current hospital  active medication list Current Facility-Administered Medications  Medication Dose Route Frequency Provider Last Rate Last Dose  . albuterol (PROVENTIL) (2.5 MG/3ML) 0.083% nebulizer solution 2.5 mg  2.5 mg Nebulization Q4H PRN Cathlyn Parsons, PA-C   2.5 mg at 12/01/18 1611  . arformoterol (BROVANA)  nebulizer solution 15 mcg  15 mcg Nebulization BID Cathlyn Parsons, PA-C   15 mcg at 12/01/18 2113  . bisacodyl (DULCOLAX) suppository 10 mg  10 mg Rectal Daily PRN Angiulli, Lavon Paganini, PA-C      . budesonide (PULMICORT) nebulizer solution 0.5 mg  0.5 mg Nebulization BID AngiulliLavon Paganini, PA-C   0.5 mg at 12/01/18 2113  . chlorhexidine (PERIDEX) 0.12 % solution 15 mL  15 mL Mouth Rinse BID Meredith Staggers, MD   15 mL at 12/02/18 0826  . clonazePAM (KLONOPIN) disintegrating tablet 0.5 mg  0.5 mg Per Tube TID PRN Cathlyn Parsons, PA-C   0.5 mg at 12/01/18 2131  . divalproex (DEPAKOTE ER) 24 hr tablet 250 mg  250 mg Oral TID Cathlyn Parsons, PA-C   250 mg at 12/01/18 2131  . enoxaparin (LOVENOX) injection 30 mg  30 mg Subcutaneous Q24H Kirsteins, Luanna Salk, MD   30 mg at 12/01/18 1315  . famotidine (PEPCID) 40 MG/5ML suspension 20 mg  20 mg Per Tube BID Cathlyn Parsons, PA-C   20 mg at 11/30/18 2144  . FLUoxetine (PROZAC) capsule 40 mg  40 mg Per Tube Daily Cathlyn Parsons, PA-C   40 mg at 12/01/18 1607  . folic acid (FOLVITE) tablet 1 mg  1 mg Per Tube Daily Angiulli, Lavon Paganini, PA-C   1 mg at 12/02/18 3710  . insulin aspart (novoLOG) injection 0-9 Units  0-9 Units Subcutaneous Q4H Cathlyn Parsons, PA-C   1 Units at 11/29/18 2047  . ipratropium-albuterol (DUONEB) 0.5-2.5 (3) MG/3ML nebulizer solution 3 mL  3 mL Nebulization BID Meredith Staggers, MD   3 mL at 12/01/18 2113  . levothyroxine (SYNTHROID, LEVOTHROID) tablet 75 mcg  75 mcg Per Tube Q0600 Cathlyn Parsons, PA-C   75 mcg at 12/02/18 0455  . MEDLINE mouth rinse  15 mL Mouth Rinse q12n4p Meredith Staggers, MD   15 mL at 11/30/18 1524  . multivitamin with minerals tablet 1 tablet  1 tablet Oral Daily Meredith Staggers, MD   1 tablet at 12/01/18 1315  . polyethylene glycol (MIRALAX / GLYCOLAX) packet 17 g  17 g Oral Daily PRN Angiulli, Lavon Paganini, PA-C      . predniSONE (DELTASONE) tablet 10 mg  10 mg Per Tube Q breakfast  Angiulli, Lavon Paganini, PA-C   10 mg at 12/02/18 6269  . sodium chloride flush (NS) 0.9 % injection 10-40 mL  10-40 mL Intracatheter PRN Meredith Staggers, MD   30 mL at 12/02/18 0808  . thiamine (VITAMIN B-1) tablet 100 mg  100 mg Per Tube Daily Cathlyn Parsons, PA-C   100 mg at 12/02/18 4854     Discharge Medications: Please see discharge summary for a list of discharge medications.  Relevant Imaging Results:  Relevant Lab Results:   Additional Information [id number].   Skiler Olden, LCSW

## 2018-12-02 NOTE — Progress Notes (Signed)
This RN went into patient room to assist NT in scooting patient up to the top of the bed so she could eat her breakfast, patient is a full supervision. NT pointed out on the safety plan that the patient's friends name "[name]" has been written on the safety plan to assist with meals. This RN also noted that this name had also been scratched out. Upon checking progress notes SLP had a note that stated, "SLP did NOT sign off [name] to provide supervision during meals and [name] stated agreement." Nursing and SLP will continue to monitor the patient and meal supervisions.

## 2018-12-02 NOTE — Progress Notes (Signed)
Social Work Patient ID: Courtney Terry, female   DOB: July 10, 1961, 58 y.o.   MRN: [id number]   Have spoken with pt's sister, Leonia Reader, to clarify several issues regarding pt's care while here as well as d/c plan. Explained to sister that this morning, per ST and RN,  pt's friend, Legrand Como, had supported pt in declining a medication (keppra) and arguing that it was not needed.  He was also insistent that he be cleared to feed pt, however, staff did not feel this was safe for the patient.   Sister is pt's legal next -of-kin and her wishes are that Legrand Como be allowed to visit pt as he wishes, however, he is not to have any say so in regards to her medical or therapeutic care.  If he argues with staff or interferes in any way with pt care, then staff should ask him to leave.  Sister also confirms that family is unable to provide needed 24/7 care and wish for this SW to pursue SNF placment.    Kyleigh Nannini, LCSW

## 2018-12-02 NOTE — Progress Notes (Signed)
[facility] PHYSICAL MEDICINE & REHABILITATION PROGRESS NOTE   Subjective/Complaints: Intake fair to good, IV RN asking about need for PICC  ROS- denies CP, SOB,N/V/D Objective:   No results found. Recent Labs    11/30/18 0432  WBC 11.0*  HGB 10.7*  HCT 35.3*  PLT 264   Recent Labs    11/30/18 0432  NA 135  K 3.5  CL 98  CO2 29  GLUCOSE 87  BUN 9  CREATININE 0.33*  CALCIUM 8.9    Intake/Output Summary (Last 24 hours) at 12/02/2018 0848 Last data filed at 12/02/2018 2992 Gross per 24 hour  Intake 310 ml  Output -  Net 310 ml     Physical Exam: Vital Signs Blood pressure 111/83, pulse 74, temperature 98.6 F (37 C), temperature source Oral, resp. rate 19, height 5\' 10"  (1.778 m), weight 44.2 kg, SpO2 96 %. HEENT:  Trach clean General: No acute distress, cachectic Mood and affect are appropriate Heart: Regular rate and rhythm no rubs murmurs or extra sounds Lungs: Clear to auscultation, breathing unlabored, no rales or wheezes Abdomen: Positive bowel sounds, soft nontender to palpation, nondistended Extremities: No clubbing, cyanosis, or edema Skin: No evidence of breakdown, no evidence of rash Neurologic: Cranial nerves II through XII intact, motor strength is 4/5 in bilateral deltoid, bicep, tricep, grip, hip flexor, knee extensors, ankle dorsiflexor and plantar flexor  Musculoskeletal: Full range of motion in all 4 extremities. No joint swelling    Assessment/Plan: 1. Functional deficits secondary to debilitiy from respiratory failure which require 3+ hours per day of interdisciplinary therapy in a comprehensive inpatient rehab setting.  Physiatrist is providing close team supervision and 24 hour management of active medical problems listed below.  Physiatrist and rehab team continue to assess barriers to discharge/monitor patient progress toward functional and medical goals  Care Tool:  Bathing    Body parts bathed by patient: Right arm, Left arm,  Chest, Abdomen, Right upper leg, Left upper leg, Face   Body parts bathed by helper: Front perineal area, Buttocks, Face     Bathing assist Assist Level: Moderate Assistance - Patient 50 - 74%     Upper Body Dressing/Undressing Upper body dressing   What is the patient wearing?: Pull over shirt    Upper body assist Assist Level: Maximal Assistance - Patient 25 - 49%    Lower Body Dressing/Undressing Lower body dressing      What is the patient wearing?: Pants     Lower body assist Assist for lower body dressing: Moderate Assistance - Patient 50 - 74%     Toileting Toileting Toileting Activity did not occur (Clothing management and hygiene only): N/A (no void or bm)  Toileting assist Assist for toileting: Dependent - Patient 0%     Transfers Chair/bed transfer  Transfers assist     Chair/bed transfer assist level: Moderate Assistance - Patient 50 - 74%     Locomotion Ambulation   Ambulation assist   Ambulation activity did not occur: Safety/medical concerns          Walk 10 feet activity   Assist  Walk 10 feet activity did not occur: Refused        Walk 50 feet activity   Assist Walk 50 feet with 2 turns activity did not occur: Safety/medical concerns         Walk 150 feet activity   Assist Walk 150 feet activity did not occur: Safety/medical concerns         Walk  10 feet on uneven surface  activity   Assist Walk 10 feet on uneven surfaces activity did not occur: Safety/medical concerns         Wheelchair     Assist   Type of Wheelchair: Manual Wheelchair activity did not occur: Refused         Wheelchair 50 feet with 2 turns activity    Assist    Wheelchair 50 feet with 2 turns activity did not occur: Refused       Wheelchair 150 feet activity     Assist Wheelchair 150 feet activity did not occur: Safety/medical concerns        Medical Problem List and Plan: 1.Debility and metabolic  encephalopathysecondary to acute on chronic respiratory failure status 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  DVT prophyllaxis Subcutaneous heparin.no renal failure , switch to Lovenox 3. Pain Management:Tylenol as needed 4. Mood/bipolar disorder:Prozac 40 mg daily for mood stabilization, Klonopin 0.5 mg 3 times a day as needed for anxiety or agitation -monitor sleep patterns -sleep chart 5. Neuropsych: This patientis notcapable of making decisions on herown behalf. 6. Skin/Wound Care:Routine skin checks 7. Fluids/Electrolytes/Nutrition:Routine in and out'sMeal intake good, fluid intake fair but BMET shows no pre renal azotemia, will d/c PICC 8. Chronic hypotension. Patient had been on ProAmatine 10 mg 3 times a day as well as Florinef prior to admission.Resume as neededpendng tolerance of therapy Vitals:   12/01/18 2115 12/02/18 0450  BP:  111/83  Pulse:  74  Resp:  19  Temp:  98.6 F (37 C)  SpO2: 100% 96%  controlled 2/26 9.COPD with history of tobacco abuse. Patient on 2 L oxygen at home.Continue chronic prednisone. Follow-up pulmonary services Dr.Wert.Continue nebulizersas directed, ?wean trach ask CCM 10. Seizure disorder. ContinueDepakoteas prior to admission.. EEG negative No clinical seizure activity 11.Hypothyroidism. Synthroid 12. Dysphagia. Dysphagia #2 nectar liquids. Follow-up speech therapy. Monitor hydration. 13. Polysubstance abuse. UDS positive for cocaine. Provide counseling 14. History of breast cancer with lumpectomy. Patient on Arimidex 1 mg daily prior to admission.? Restart  15.  Leukocytosis with chronic steroid use, afeb  LOS: 5 days A FACE TO FACE EVALUATION WAS PERFORMED  Charlett Blake 12/02/2018, 8:48 AM

## 2018-12-02 NOTE — Progress Notes (Signed)
Speech Language Pathology Daily Session Note  Patient Details  Name: Courtney Terry MRN: [id number] Date of Birth: Apr 30, 1961  Today's Date: 12/02/2018   Skilled treatment session #1 SLP Individual Time: 0830-0930 SLP Individual Time Calculation (min): 60 min   Short Term Goals: Week 1: SLP Short Term Goal 1 (Week 1): Patient will consume current diet with minimal overt s/s of aspiration and supervision verbal cues for use of swallowing compensatory srategies.  SLP Short Term Goal 2 (Week 1): Patient will consume trials of thin liquids without overt s/s of aspiration over 3 sessions prior to upgrade with supervision verbal cues.  SLP Short Term Goal 3 (Week 1): Patient will consume trials of Dys. 3 textures with efficient mastication and complete oral clearance over 2 sessions prior to upgrade.  SLP Short Term Goal 4 (Week 1): Patient will demonstrate sustained attention to tasks for 10 minutes with Mod A verbal cues for redirection.  SLP Short Term Goal 5 (Week 1): Patient will utilize multimodal communication (gestures, mouthing words, communication board) to express wants/needs with Mod A mulitmodal cues.  SLP Short Term Goal 6 (Week 1): Patient will tolerate PMSV for ~1 minute with all vitals remaining WFL without signs of distress with Max A multimodal cues.   Skilled Therapeutic Interventions:  Skilled treatment session #1 focused on dysphagia goals. SLP arrived to pt's room with pt's significant other [name]) requesting to be signed off to feed pt. RN and NT present with NT encouraging pt to self-feed. SLP requested [name] to demonstrate ability to assist/supervision pt during PO consumption. He was reluctant but eventually agreeable. He feed pt appropriate bolus size but rate of consumption was too fast as pt's O2 sats dropped to 90% and pt required rest break with Mod A cues for deep breathing. Pt with no overt s/s of aspiration during consumption of dysphagia 2 breakfast tray with nectar  thick liquids. Throughout session [name] continued to state that he "knew Joseph Art better than anybody, he has been taking care of her for 11 years." He frequently used this statement to prove that he was capable in helping her eat and supervise her. However he provided items off of pt's diet on previous day. When this "lapse" in safety/judgement was discussed, he states that he "knows her best and knows what she is capable of eating." Education provided by this writer on aspiration risk. RN present and administered medication whole with nectar thick liquids. Pt consumed without oropharyngeal deficits but then refused to consumed the last 4 pills (which included her Keppra). Education provided on need for anti-seizure medicine with [name] disputing need because pt "has never had a seizure." RN and this Probation officer provided education on medically based research to support need for medication. Pt frequently looked at [facility] for confirmation. She didn't take her Keppra. Before [name] finished feeding the pt, he announced that he was leaving to get something to eat. This Probation officer stated that given pt's medial condition, need for rest breaks during PO consumption, wet secretions coming from trach hub during consumption, that medical personal were responsible for supervising pt with PO consumption. This Probation officer does NOT feel that [name] is able to safely supervise pt. [name] became mad and mildly hostile. As he was exiting room, he came back into room and began speaking negatively of rehab services and it "was best that he didn't even come to the hospital anymore." This comment was distressing to pt. This Probation officer provided support that I heard what he was expressing and made  CSW and therapy supervisor aware of Mike's disagreement.   Of note, during session, PMV was not attempted d/t secretions coming from trach hub. Pt with weak cough and minimal ability to expel secretions at trach hub.   Later in day, plan was established by  interdisciplinary team to provide guideline for Mike's attempts at participating/directing pt's care.      Pain    Therapy/Group: Individual Therapy  Odessa Nishi 12/02/2018, 1:24 PM

## 2018-12-02 NOTE — Progress Notes (Signed)
Physical Therapy Session Note  Patient Details  Name: Courtney Terry MRN: [id number] Date of Birth: [birth year]  Today's Date: 12/02/2018 PT Individual Time:0930-1015; 5573-2202 PT Individual Time Calculation (min): 45 min and 60 min  PT Missed Time: 15 min Missed Time Reason: pt unwilling to participate  Short Term Goals: Week 1:  PT Short Term Goal 1 (Week 1): Pt will performed sit<>stand with mod assist  PT Short Term Goal 2 (Week 1): Pt will perform bed mobility with supevision assist  PT Short Term Goal 3 (Week 1): Pt will performed bed<>chair with stand pivot and mod assist   PT Short Term Goal 4 (Week 1): Pt will abmulate 60ft with mod assist and LRAD  PT Short Term Goal 5 (Week 1): Pt will propell WC 71ft with min assist   Skilled Therapeutic Interventions/Progress Updates:    Session 1: Pt received seated in bed, agreeable to PT session. Pt reports incontinence in brief. Rolling L/R with min A and use of bedrails for dependent brief change and pericare. Pt is dependent to don pants due to inability to follow cues in order to assist. Supine to sit with mod A. Sitting balance EOB with close SBA. Pt is max A to doff tshirt and don new shirt due to inability to follow cues to assist. Pt requesting suctioning of trach during session, RN notified and is able to assist pt with suctioning. Sit to supine mod A for BLE. Pt left seated in bed with needs in reach, bed alarm in place. Pt on 5L O2 throughout session via trach collar, SpO2 96% and above with activity.  Session 2: Pt received seated in bed, agreeable with encouragement to participate in therapy session. Supine to sit with mod A for trunk control. Pt requires increased time and encouragement in order to complete transfer from bed to w/c. Pt becomes agitated when therapist attempts to assist pt and provide manual cueing for hand placement during transfer. Once pt is agreeable she is min A for squat pivot transfer bed to w/c. Sit to  stand x 2 reps in // bars with mod A, max encouragement needed to perform. Pt becomes agitated again and keeps removing her hands from // bars and brushing away therapist. Education with patient about importance of standing if she wants to become ambulatory again as well as for improving muscle strength, endurance, etc. Pt only agreeable to standing x 2 reps then requesting to return to her room. Once back in her room pt requesting to be suctioned, RN notified. Pt given option of sitting up in chair or returning to bed, pt indicates she would like to return to bed. Attempt to assist pt with hand placement and initiating squat pivot transfer back to bed. Pt becomes irritated and again brushes away therapists hands. Education with patient that if she is wanting to return to bed she will need to let therapist assist her, pt agreeable. Pt is dependent for transfer back to bed due to lack of participation. Sit to supine mod A. Pt left seated in bed with needs in reach, bed alarm in place. Pt on 5L O2 throughout therapy session, SpO2 95% or higher during session. Pt's sister's present during session and providing encouragement for patient to participate. Pt missed 15 min of scheduled therapy session this PM due to no longer wishing to participate in functional activity.  Therapy Documentation Precautions:  Precautions Precautions: Fall Precaution Comments: Trach collar, supplemental 02 Restrictions Weight Bearing Restrictions: No   Therapy/Group: Individual  Therapy   Excell Seltzer, PT, DPT  12/02/2018, 3:36 PM

## 2018-12-03 ENCOUNTER — Inpatient Hospital Stay (HOSPITAL_COMMUNITY): Payer: Medicaid Other | Admitting: Occupational Therapy

## 2018-12-03 ENCOUNTER — Inpatient Hospital Stay (HOSPITAL_COMMUNITY): Payer: Medicaid Other

## 2018-12-03 ENCOUNTER — Telehealth: Payer: Self-pay | Admitting: Hematology

## 2018-12-03 ENCOUNTER — Inpatient Hospital Stay (HOSPITAL_COMMUNITY): Payer: Medicaid Other | Admitting: Speech Pathology

## 2018-12-03 ENCOUNTER — Inpatient Hospital Stay (HOSPITAL_COMMUNITY): Payer: Medicaid Other | Admitting: Physical Therapy

## 2018-12-03 DIAGNOSIS — Z430 Encounter for attention to tracheostomy: Secondary | ICD-10-CM

## 2018-12-03 LAB — GLUCOSE, CAPILLARY
GLUCOSE-CAPILLARY: 89 mg/dL (ref 70–99)
Glucose-Capillary: 111 mg/dL — ABNORMAL HIGH (ref 70–99)
Glucose-Capillary: 78 mg/dL (ref 70–99)
Glucose-Capillary: 93 mg/dL (ref 70–99)
Glucose-Capillary: 98 mg/dL (ref 70–99)

## 2018-12-03 MED ORDER — ACETAMINOPHEN 325 MG PO TABS
650.0000 mg | ORAL_TABLET | Freq: Four times a day (QID) | ORAL | Status: DC | PRN
  Administered 2018-12-03 – 2018-12-23 (×6): 650 mg via ORAL
  Filled 2018-12-03 (×10): qty 2

## 2018-12-03 MED ORDER — ANASTROZOLE 1 MG PO TABS
1.0000 mg | ORAL_TABLET | Freq: Every day | ORAL | Status: DC
  Administered 2018-12-03 – 2018-12-24 (×21): 1 mg via ORAL
  Filled 2018-12-03 (×22): qty 1

## 2018-12-03 NOTE — Progress Notes (Signed)
Speech Language Pathology Daily Session Note  Patient Details  Name: Courtney Terry MRN: [id number] Date of Birth: [birth year]  Today's Date: 12/03/2018 SLP Individual Time: 0830-0930 SLP Individual Time Calculation (min): 60 min  Short Term Goals: Week 1: SLP Short Term Goal 1 (Week 1): Patient will consume current diet with minimal overt s/s of aspiration and supervision verbal cues for use of swallowing compensatory srategies.  SLP Short Term Goal 2 (Week 1): Patient will consume trials of thin liquids without overt s/s of aspiration over 3 sessions prior to upgrade with supervision verbal cues.  SLP Short Term Goal 3 (Week 1): Patient will consume trials of Dys. 3 textures with efficient mastication and complete oral clearance over 2 sessions prior to upgrade.  SLP Short Term Goal 4 (Week 1): Patient will demonstrate sustained attention to tasks for 10 minutes with Mod A verbal cues for redirection.  SLP Short Term Goal 5 (Week 1): Patient will utilize multimodal communication (gestures, mouthing words, communication board) to express wants/needs with Mod A mulitmodal cues.  SLP Short Term Goal 6 (Week 1): Patient will tolerate PMSV for ~1 minute with all vitals remaining WFL without signs of distress with Max A multimodal cues.   Skilled Therapeutic Interventions:  Skilled treatment session focused on dysphagia, communication and education. SLP received pt from NT and assisted with self-feeding pt. Pt's O2 sats were in the mid 90's. SLP provided rest breaks during consumption and pt with no overt s.s of aspiration however, mastication and consumption appears fatiguing. After ~ 15 minutes, pt with no signs of aspiration but began struggling to take breaks, O2 sat at 85%. Nursing in to assist with RT suctioning (no mucus) but increase O2 with O2 sats coming up. SLP further facilitated session by placing finger over trach hub to promote increased intelligibility. Pt able to take deep breath, SLP  placed finger and pt able to vocalize that she wanted potato chips or graham crackers. Education provided on POC to possibly target at next session. At this time in session, SLP was finished d/t time spent providing pericare from incontinent bowl movement. Pt was left in care of nursing staff.      Pain Pain Assessment Pain Scale: 0-10 Pain Score: 0-No pain  Therapy/Group: Individual Therapy  Raydell Maners 12/03/2018, 9:48 AM

## 2018-12-03 NOTE — Progress Notes (Signed)
Occupational Therapy Session Note  Patient Details  Name: Courtney Terry MRN: [id number] Date of Birth: Feb 15, 1961  Today's Date: 12/03/2018 OT Individual Time: 1103-1594 OT Individual Time Calculation (min): 27 min    Short Term Goals: Week 1:  OT Short Term Goal 1 (Week 1): Pt will thread 1 LE into pants with supervision  OT Short Term Goal 2 (Week 1): Pt will complete sit<stand for bathing with Min A and LRAD  OT Short Term Goal 3 (Week 1): Pt will complete toilet transfer with Min A and LRAD  Skilled Therapeutic Interventions/Progress Updates:    Treatment session with focus on attempts to get pt to participate in any functional task.  Pt body positioned away from therapist and resisting participation.  Able to encourage pt to roll to midline to allow respiratory to assess her trach.  Pt then requesting chocolate pudding.  Encouraged pt to engage in self-feeding with pt bringing first bite up to mouth without assistance, after therapist scooped pudding.  Once set up for second bite pt unable/unwilling to bring spoon to mouth despite encouragement and even attempts at hand over hand. Pt ultimately turned head to gentleman friend in room and mouthed something in frustration and threw arm with spoon and pudding down towards bed.  Spoon and pudding removed from patient.  LPN and nurse tech notified of pt current frustration and encouraged them to assist/supervise remainder of pudding as pt willing.  Therapy Documentation Precautions:  Precautions Precautions: Fall Precaution Comments: Trach collar, supplemental 02 Restrictions Weight Bearing Restrictions: No General:   Vital Signs: Therapy Vitals Pulse Rate: 75 Resp: 16 BP: 102/71 Patient Position (if appropriate): Lying Oxygen Therapy SpO2: 91 %(increased per SPo2 88%) O2 Device: Tracheostomy Collar O2 Flow Rate (L/min): 8 L/min FiO2 (%): 35 % Pain: Pain Assessment Pain Scale: 0-10 Pain Score: 0-No pain   Therapy/Group:  Individual Therapy  [name] 12/03/2018, 12:51 PM

## 2018-12-03 NOTE — Progress Notes (Addendum)
[facility] PHYSICAL MEDICINE & REHABILITATION PROGRESS NOTE   Subjective/Complaints: Resp tx feels comfortable with downsizing, CCM has not weighed in  ROS- denies CP, SOB,N/V/D Objective:   No results found. No results for input(s): WBC, HGB, HCT, PLT in the last 72 hours. No results for input(s): NA, K, CL, CO2, GLUCOSE, BUN, CREATININE, CALCIUM in the last 72 hours.  Intake/Output Summary (Last 24 hours) at 12/03/2018 0833 Last data filed at 12/02/2018 1900 Gross per 24 hour  Intake 360 ml  Output -  Net 360 ml     Physical Exam: Vital Signs Blood pressure 114/87, pulse 81, temperature 98 F (36.7 C), resp. rate 18, height 5\' 10"  (1.778 m), weight 45.8 kg, SpO2 94 %. HEENT:  Trach clean General: No acute distress, cachectic Mood and affect are appropriate Heart: Regular rate and rhythm no rubs murmurs or extra sounds Lungs: Clear to auscultation, breathing unlabored, no rales or wheezes Abdomen: Positive bowel sounds, soft nontender to palpation, nondistended Extremities: No clubbing, cyanosis, or edema Skin: No evidence of breakdown, no evidence of rash Neurologic: Cranial nerves II through XII intact, motor strength is 4/5 in bilateral deltoid, bicep, tricep, grip, hip flexor, knee extensors, ankle dorsiflexor and plantar flexor, inchanged  Musculoskeletal: Full range of motion in all 4 extremities. No joint swelling    Assessment/Plan: 1. Functional deficits secondary to debilitiy from respiratory failure which require 3+ hours per day of interdisciplinary therapy in a comprehensive inpatient rehab setting.  Physiatrist is providing close team supervision and 24 hour management of active medical problems listed below.  Physiatrist and rehab team continue to assess barriers to discharge/monitor patient progress toward functional and medical goals  Care Tool:  Bathing    Body parts bathed by patient: Right arm, Left arm, Chest, Abdomen, Right upper leg, Left  upper leg, Face   Body parts bathed by helper: Front perineal area, Buttocks, Face     Bathing assist Assist Level: Moderate Assistance - Patient 50 - 74%     Upper Body Dressing/Undressing Upper body dressing   What is the patient wearing?: Pull over shirt    Upper body assist Assist Level: Maximal Assistance - Patient 25 - 49%    Lower Body Dressing/Undressing Lower body dressing      What is the patient wearing?: Pants     Lower body assist Assist for lower body dressing: Moderate Assistance - Patient 50 - 74%     Toileting Toileting Toileting Activity did not occur Landscape architect and hygiene only): N/A (no void or bm)  Toileting assist Assist for toileting: Dependent - Patient 0%     Transfers Chair/bed transfer  Transfers assist     Chair/bed transfer assist level: Dependent - Patient 0%     Locomotion Ambulation   Ambulation assist   Ambulation activity did not occur: Safety/medical concerns          Walk 10 feet activity   Assist  Walk 10 feet activity did not occur: Refused        Walk 50 feet activity   Assist Walk 50 feet with 2 turns activity did not occur: Safety/medical concerns         Walk 150 feet activity   Assist Walk 150 feet activity did not occur: Safety/medical concerns         Walk 10 feet on uneven surface  activity   Assist Walk 10 feet on uneven surfaces activity did not occur: Safety/medical concerns  Wheelchair     Assist   Type of Wheelchair: Educational psychologist activity did not occur: Refused         Wheelchair 50 feet with 2 turns activity    Assist    Wheelchair 50 feet with 2 turns activity did not occur: Refused       Wheelchair 150 feet activity     Assist Wheelchair 150 feet activity did not occur: Safety/medical concerns        Medical Problem List and Plan: 1.Debility and metabolic encephalopathysecondary to acute on chronic respiratory  failure status 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  DVT prophyllaxis Subcutaneous heparin.no renal failure , switch to Lovenox 3. Pain Management:Tylenol as needed 4. Mood/bipolar disorder:Prozac 40 mg daily for mood stabilization, Klonopin 0.5 mg 3 times a day as needed for anxiety or agitation -monitor sleep patterns -sleep chart 5. Neuropsych: This patientis notcapable of making decisions on herown behalf. 6. Skin/Wound Care:Routine skin checks 7. Fluids/Electrolytes/Nutrition:Routine in and out's Meal intake good, fluid intake fair but BMET shows no pre renal azotemia, will d/c PICC 8. Chronic hypotension. Patient had been on ProAmatine 10 mg 3 times a day as well as Florinef prior to admission.Resume as neededpendng tolerance of therapy Vitals:   12/03/18 0751 12/03/18 0756  BP:    Pulse: 81   Resp: 18   Temp:    SpO2: 92% 94%  pending 2/27 9.COPD with history of tobacco abuse. Patient on 2 L oxygen at home.Continue chronic prednisone. Follow-up pulmonary services Dr.Wert.Continue nebulizersas directed, would like to wean trach currently #6 cuffec , ask CCM 10. Seizure disorder. ContinueDepakoteas prior to admission.. EEG negative No clinical seizure activity 11.Hypothyroidism. Synthroid 12. Dysphagia. Dysphagia #2 nectar liquids. Follow-up speech therapy. Monitor hydration. 13. Polysubstance abuse. UDS positive for cocaine. Provide counseling 14. History of breast cancer with lumpectomy. Patient on Arimidex 1 mg daily prior to admission.OK to restart per Dr Burr Medico Heme/Onc also Dr Burr Medico wants pt to f/u since she hasn't been seen since 2018  15.  Leukocytosis with chronic steroid use, afeb  LOS: 6 days A FACE TO FACE EVALUATION WAS PERFORMED  Charlett Blake 12/03/2018, 8:33 AM

## 2018-12-03 NOTE — Progress Notes (Signed)
Attempted to change trach at this time per MD order. Unable to get trach out by 2 therapists. Meeting a lot of resistance and patient pushing hands away. Will page MD.

## 2018-12-03 NOTE — Progress Notes (Signed)
MD made aware of trach change due today but stated he would prefer pulmonary to come assess if patient can have trach changed/downsized.

## 2018-12-03 NOTE — Progress Notes (Signed)
Called to room due to patient feeling short of breath. Speech at bedside. Pt BBS diminished. Increased fio2 to 35% due to SPo2 88%. Now 91% on 35%. RR 18. Pt states she doesn't feel like suction but I explained it may benefit. RT obtained minimal white/clear. Inner cannula changed which was very clean.

## 2018-12-03 NOTE — Progress Notes (Signed)
Occupational Therapy Note  Patient Details  Name: Courtney Terry MRN: [id number] Date of Birth: July 20, 1961  Today's Date: 12/03/2018 OT Missed Time: 75 Minutes Missed Time Reason: Patient unwilling/refused to participate without medical reason  Pt refused 75 mins scheduled OT treatment session shaking head "no" to multiple attempts at therapeutic activity.  Pt stating "I don't feel well".  RN present providing tylenol for elevated temperature.  Spoke with PA regarding pt status with discussion to attempt again tomorrow.     [name] 12/03/2018, 3:11 PM

## 2018-12-03 NOTE — Progress Notes (Signed)
NAME:  Courtney Terry, MRN:  [id number], DOB:  Nov 09, 1960, LOS: 6 ADMISSION DATE:  11/27/2018, CONSULTATION DATE:  11/08/18 REFERRING MD:  Karmen Bongo, MD, CHIEF COMPLAINT:  Respiratory failure    Brief History   58 year old female with COPD (FEV1 25%) and chronic hypoxemic respiratory failure. Admitted 2/2-2/21 with acute on chronic hypoxemic and hypercarbic respiratory failure requiring intubation. UDS positive for cocaine. Tracheostomy placement on 2/13. 2/21 Admitted to [facility].  PCCM asked to consult 2/27 for tracheostomy care.   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 HITT negative.  2/16 Sedate, gtt's being weaned off, on ATC during day but then required MV for resp distress 2/21 IP Rehab   Consults:  PCCM   Procedures:  ETT 2/2 >> 2/13 Trach 2/13 >> R PICC 2/13 > 2/26   Significant Diagnostic Tests:  CTA 2/2: Neg for PE, emphysema, multiple pulmonary nodules, ETT in place CT head w/o contrast 2/2: stable/normal EEG 2/3: moderate global encephalopathy,  No epileptiform activities  UDS 2/2 > cocaine  Micro Data:  Resp PCR 2/2 > negative  UC 2/2 > 100k corynebacterium  Sputum 2/2 > normal flora  BCx2 2/2 > negative  Cervical Swab 2/14 > trichomonas   Antimicrobials:  Azithromycin 2/2 > 2/6 Ceftriaxone 2/3 >2/7 Flagyl 2/14 >off  Interim history/subjective:  Maintains of TC with 6 cuffed Shiley. Continued concern for aspiration events   Objective   Blood pressure 102/71, pulse 75, temperature 98 F (36.7 C), resp. rate 16, height 5\' 10"  (1.778 m), weight 45.8 kg, SpO2 91 %.    FiO2 (%):  [28 %-35 %] 35 %   Intake/Output Summary (Last 24 hours) at 12/03/2018 1303 Last data filed at  12/02/2018 1900 Gross per 24 hour  Intake 0 ml  Output -  Net 0 ml   Filed Weights   12/01/18 0705 12/02/18 0017 12/03/18 0203  Weight: 44 kg 44.2 kg 45.8 kg   EXAM: General:  Frail thin female, sitting in bed eating  HEENT: trach intact Neuro: alert, follows commands  CV: s1s2 rrr, no m/r/g PULM: clear breath sounds, no wheeze/crackles GI: soft, non-tender, active bowel wounds  Extremities: -edema  Skin: intact   LABS   PULMONARY No results for input(s): PHART, PCO2ART, PO2ART, HCO3, TCO2, O2SAT in the last 168 hours.  Invalid input(s): PCO2, PO2  CBC Recent Labs  Lab 11/27/18 1821 11/30/18 0432  HGB 10.5* 10.7*  HCT 34.1* 35.3*  WBC 12.8* 11.0*  PLT 207 264    COAGULATION No results for input(s): INR in the last 168 hours.  CARDIAC  No results for input(s): TROPONINI in the last 168 hours. No results for input(s): PROBNP in the last 168 hours.   CHEMISTRY Recent Labs  Lab 11/27/18 0650 11/27/18 1821 11/30/18 0432  NA  --   --  135  K  --   --  3.5  CL  --   --  98  CO2  --   --  29  GLUCOSE  --   --  87  BUN  --   --  9  CREATININE  --  0.42* 0.33*  CALCIUM  --   --  8.9  MG 1.6*  --   --  PHOS 3.7  --   --    Estimated Creatinine Clearance: 56.8 mL/min (A) (by C-G formula based on SCr of 0.33 mg/dL (L)).   LIVER Recent Labs  Lab 11/30/18 0432  AST 13*  ALT 12  ALKPHOS 42  BILITOT 0.3  PROT 5.8*  ALBUMIN 2.7*    INFECTIOUS No results for input(s): LATICACIDVEN, PROCALCITON in the last 168 hours.   ENDOCRINE CBG (last 3)  Recent Labs    12/03/18 0050 12/03/18 0409 12/03/18 1125  GLUCAP 78 111* 93     IMAGING   No results found.  Resolved Hospital Problem list   VDRF  Assessment & Plan:   Acute on Chronic Hypoxemic and Hypercarbic Respiratory Failure in setting of severe COPD (FEV1 25%), s/p tracheostomy  P: Wean Supplemental Oxygen to Maintain Saturation >90  Routine Trach Care Continue working with ST,  currently on Dysphagia diet, per staff continued concern with aspiration Patient with weak cough, minimal thick secretions > Will downsize from 6 cuffed to 4 cuff-less > Information relayed to RT and Bedside RN   Best practice:  Diet: Dysphagia  DVT prophylaxis: SQ heparin, SCDs Mobility: OOB Code Status: Full  Family Communication: Patient and family member updated on plan of care       Hayden Pedro, [facility]  Pgr: [phone]  PCCM Pgr: [phone]

## 2018-12-03 NOTE — Plan of Care (Signed)
  Problem: Consults Goal: RH GENERAL PATIENT EDUCATION Description See Patient Education module for education specifics. Outcome: Progressing   Problem: RH BOWEL ELIMINATION Goal: RH STG MANAGE BOWEL WITH ASSISTANCE Description STG Manage Bowel with min Assistance.  Outcome: Progressing   Problem: RH SKIN INTEGRITY Goal: RH STG SKIN FREE OF INFECTION/BREAKDOWN Description With min assist  Outcome: Progressing Goal: RH STG MAINTAIN SKIN INTEGRITY WITH ASSISTANCE Description STG Maintain Skin Integrity With min Assistance.  Outcome: Progressing   Problem: RH SAFETY Goal: RH STG ADHERE TO SAFETY PRECAUTIONS W/ASSISTANCE/DEVICE Description STG Adhere to Safety Precautions With cues/reminders Assistance/Device.  Outcome: Progressing   Problem: RH KNOWLEDGE DEFICIT GENERAL Goal: RH STG INCREASE KNOWLEDGE OF SELF CARE AFTER HOSPITALIZATION Description Family will be able to explain pt care needs and demonstrate ability to perform using handouts and resources independently  Outcome: Progressing

## 2018-12-03 NOTE — Progress Notes (Signed)
Physical Therapy Session Note  Patient Details  Name: Courtney Terry MRN: [id number] Date of Birth: 02-15-1961  Today's Date: 12/03/2018 PT Individual Time: 1300-1330 PT Individual Time Calculation (min): 30 min   Short Term Goals: Week 1:  PT Short Term Goal 1 (Week 1): Pt will performed sit<>stand with mod assist  PT Short Term Goal 2 (Week 1): Pt will perform bed mobility with supevision assist  PT Short Term Goal 3 (Week 1): Pt will performed bed<>chair with stand pivot and mod assist   PT Short Term Goal 4 (Week 1): Pt will abmulate 53ft with mod assist and LRAD  PT Short Term Goal 5 (Week 1): Pt will propell WC 72ft with min assist   Skilled Therapeutic Interventions/Progress Updates:    Pt received seated in bed, requesting to eat lunch as it has just arrived. No complaints of pain. Session focus on pt self-feeding via use of RUE. Pt is min A to reposition in bed due to R lateral lean. Pt initially resistant to repositioning, informed pt she needs to be sitting upright in bed in order to eat. Initially pt is setup A to feed herself with use of RUE, with onset of fatigue she has onset of RUE tremors and has increase in difficulty with feeding. Encouraged pt to use LUE to assist with steadying her RUE. Pt becomes frustrated and asks therapist to finish feeding her. Therapist provides hand-over-hand assist for RUE feeding progressing to dependent assist with increase in pt agitation and unwillingness to assist in feeding herself. Education with patient during feeding about taking small bites and to completely swallow each bite before taking the next one. Pt left in care of RN in bed.  Therapy Documentation Precautions:  Precautions Precautions: Fall Precaution Comments: Trach collar, supplemental 02 Restrictions Weight Bearing Restrictions: No   Therapy/Group: Individual Therapy   Excell Seltzer, PT, DPT  12/03/2018, 5:02 PM

## 2018-12-03 NOTE — Telephone Encounter (Signed)
Scheduled appt per 2/27 sch message - unable to reach patient or leave message - sent reminder letter in the mail with appt date and time

## 2018-12-03 NOTE — Progress Notes (Signed)
Courtney Housekeeper, NP and Dr. Lamonte Sakai stated unable to get to changing trach today. Will attempt for tomorrow or next week due to difficulty of removing trach.

## 2018-12-03 NOTE — Progress Notes (Signed)
Occupational Therapy Session Note  Patient Details  Name: Courtney Terry MRN: [id number] Date of Birth: 1961/04/15  Today's Date: 12/03/2018 OT Individual Time: 1000-1100 OT Individual Time Calculation (min): 60 min    Short Term Goals: Week 1:  OT Short Term Goal 1 (Week 1): Pt will thread 1 LE into pants with supervision  OT Short Term Goal 2 (Week 1): Pt will complete sit<stand for bathing with Min A and LRAD  OT Short Term Goal 3 (Week 1): Pt will complete toilet transfer with Min A and LRAD  Skilled Therapeutic Interventions/Progress Updates:    Patient in bed upon arrival, states that she is tired and needs encouragement to participate.  Occ smiles today during session, poor initiation of tasks.  Bed mobility with min A SSP to/from supine, sit pivot transfer to/from bed and w/c with min A, sit to stand (x4) max A.  She is able to tolerate unsupported sitting at edge of bed for 10 minutes.  Standing tolerance 30 -45 seconds per attempt.   Completed washing and dressing at w/c level in front of sink - mod A for bathing, mod A for UB dressing, max A for lower body dressing.  Completed 20 reps of UE raises with max cues and encouragement to participate.  Returned to bed for rest break at close of session.  Bed alarm, ,call bell  and O2 monitor in place.    Therapy Documentation Precautions:  Precautions Precautions: Fall Precaution Comments: Trach collar, supplemental 02 Restrictions Weight Bearing Restrictions: No General:   Vital Signs: Therapy Vitals Pulse Rate: 75 Resp: 16 BP: 102/71 Patient Position (if appropriate): Lying Oxygen Therapy SpO2: 91 %(increased per SPo2 88%) O2 Device: Tracheostomy Collar O2 Flow Rate (L/min): 8 L/min FiO2 (%): 35 % Pain: Pain Assessment Pain Scale: 0-10 Pain Score: 0-No pain Faces Pain Scale: No hurt   Therapy/Group: Individual Therapy  Carlos Levering 12/03/2018, 12:13 PM

## 2018-12-04 ENCOUNTER — Inpatient Hospital Stay (HOSPITAL_COMMUNITY): Payer: Medicaid Other | Admitting: Speech Pathology

## 2018-12-04 ENCOUNTER — Inpatient Hospital Stay (HOSPITAL_COMMUNITY): Payer: Medicaid Other | Admitting: Occupational Therapy

## 2018-12-04 ENCOUNTER — Inpatient Hospital Stay (HOSPITAL_COMMUNITY): Payer: Medicaid Other | Admitting: Physical Therapy

## 2018-12-04 LAB — GLUCOSE, CAPILLARY
Glucose-Capillary: 104 mg/dL — ABNORMAL HIGH (ref 70–99)
Glucose-Capillary: 107 mg/dL — ABNORMAL HIGH (ref 70–99)
Glucose-Capillary: 131 mg/dL — ABNORMAL HIGH (ref 70–99)
Glucose-Capillary: 81 mg/dL (ref 70–99)
Glucose-Capillary: 85 mg/dL (ref 70–99)
Glucose-Capillary: 89 mg/dL (ref 70–99)

## 2018-12-04 MED ORDER — INSULIN ASPART 100 UNIT/ML ~~LOC~~ SOLN
0.0000 [IU] | Freq: Three times a day (TID) | SUBCUTANEOUS | Status: DC
  Administered 2018-12-07: 2 [IU] via SUBCUTANEOUS
  Administered 2018-12-07: 1 [IU] via SUBCUTANEOUS
  Administered 2018-12-11: 2 [IU] via SUBCUTANEOUS
  Administered 2018-12-12: 1 [IU] via SUBCUTANEOUS
  Administered 2018-12-13: 2 [IU] via SUBCUTANEOUS

## 2018-12-04 NOTE — Progress Notes (Signed)
On 2/26 and 2/27 staff reviewed in depth guidelines to maintain patient safety between patient's sisters and niece and boyfriend, Legrand Como. No food or drink is to be given to patient from family or visitors. Staff is only to give patient food or drink. Sisters and niece verbalized understanding of instructions to maintain safety guidelines for patient and Legrand Como verbalized understanding of following guidelines to [place] , [place]. Staff has noted these guidelines will need to be reinforced to all family and boyfriend multiple times, due to requests to still feed patient. Sign is posted above patient's bed stating not to bring in food and only staff is to feed patient. If these behaviors from family or boyfriend continue, staff will request family and or boyfriend to leave if they can not follow guidelines for patient safety. Continue with plan of care.  Mliss Sax

## 2018-12-04 NOTE — Progress Notes (Signed)
Patient placed on a low air loss mattress to help heal existing pressure injuries and for prevention of new pressure injuries.

## 2018-12-04 NOTE — Progress Notes (Signed)
Occupational Therapy Session Note  Patient Details  Name: Courtney Terry MRN: [id number] Date of Birth: 02-May-1961  Today's Date: 12/04/2018 OT Individual Time:  -       Short Term Goals: Week 1:  OT Short Term Goal 1 (Week 1): Pt will thread 1 LE into pants with supervision  OT Short Term Goal 2 (Week 1): Pt will complete sit<stand for bathing with Min A and LRAD  OT Short Term Goal 3 (Week 1): Pt will complete toilet transfer with Min A and LRAD  Skilled Therapeutic Interventions/Progress Updates:    Patient in bed, attempted to complete am therapy session x2, offered repositioning, self care, light activity but patient continues to refuse indicating that she does not feel well  Therapy Documentation Precautions:  Precautions Precautions: Fall Precaution Comments: Trach collar, supplemental 02 Restrictions Weight Bearing Restrictions: No General: General OT Amount of Missed Time: 45 Minutes Vital Signs: Therapy Vitals Pulse Rate: 74 Resp: 16 Patient Position (if appropriate): Lying Oxygen Therapy SpO2: 95 % O2 Flow Rate (L/min): 8 L/min FiO2 (%): 35 % Pain: Pain Assessment Pain Scale: 0-10 Pain Score: 8  Pain Type: Acute pain Pain Location: Buttocks Pain Descriptors / Indicators: Aching;Burning;Crying;Discomfort;Restless Patients Stated Pain Goal: 2 Pain Intervention(s): RN made aware;Emotional support;Distraction;Repositioned(charge nurse made aware as well) Multiple Pain Sites: No Exercises:   Other Treatments:     Therapy/Group: Individual Therapy  Carlos Levering 12/04/2018, 10:59 AM

## 2018-12-04 NOTE — Progress Notes (Signed)
Occupational Therapy Session Note  Patient Details  Name: Courtney Terry MRN: [id number] Date of Birth: 30-Dec-1960  Today's Date: 12/04/2018 OT Individual Time: 1302-1400 OT Individual Time Calculation (min): 58 min   Short Term Goals: Week 1:  OT Short Term Goal 1 (Week 1): Pt will thread 1 LE into pants with supervision  OT Short Term Goal 2 (Week 1): Pt will complete sit<stand for bathing with Min A and LRAD  OT Short Term Goal 3 (Week 1): Pt will complete toilet transfer with Min A and LRAD  Skilled Therapeutic Interventions/Progress Updates:    Pt greeted in bed with c/o buttocks pain. RN made aware. Started tx with brief change post incontinent B+B void. 2 helpers required due to soiled sacral dressing and ulcer care. Pt able to roll Rt>Lt with Mod A. She assisted with frontal perihygiene. After pt sanitized hands, she ate lunch. Pt refused to get OOB or sit EOB due to pain. Therefore pt was repositioned in bed for self feeding. Tried to have her feed herself with pt mouthing curse words, pushing OT away, and pushing/pulling plate in agitation. Education regarding benefits of having her feed herself did not de-escalate pt. Therefore self feeding completed with Max A. She was able to manage beverages using weighted mug and shakes using B UE support with encouragement. Pt needed suction but refused for OT to complete. Therefore RN was notified to do this. Pt left with RN, repositioned in sidelying, with bed alarm set. Tx focus placed on activity tolerance, functional bed mobility, and self care retraining.   Therapy Documentation Precautions:  Precautions Precautions: Fall Precaution Comments: Trach collar, supplemental 02 Restrictions Weight Bearing Restrictions: No Vital Signs: Therapy Vitals Temp: 99 F (37.2 C) Temp Source: Oral Pulse Rate: 91 Resp: 16 BP: (!) 88/65 Patient Position (if appropriate): Lying Oxygen Therapy SpO2: 96 % O2 Device: Tracheostomy Collar O2 Flow  Rate (L/min): 8 L/min FiO2 (%): 35 % ADL: ADL Eating: Not assessed Grooming: Supervision/safety Where Assessed-Grooming: Edge of bed Upper Body Bathing: Minimal assistance(for thoroughness) Where Assessed-Upper Body Bathing: Edge of bed Lower Body Bathing: Maximal assistance Where Assessed-Lower Body Bathing: Bed level, Edge of bed Upper Body Dressing: Maximal assistance Where Assessed-Upper Body Dressing: Edge of bed Lower Body Dressing: Maximal assistance Where Assessed-Lower Body Dressing: Edge of bed Toileting: Not assessed Toilet Transfer: Moderate assistance Toilet Transfer Method: Stand pivot, Squat pivot Toilet Transfer Equipment: Bedside commode, Drop arm bedside commode Tub/Shower Transfer: Not assessed      Therapy/Group: Individual Therapy  Tamantha Saline A Debbie Yearick 12/04/2018, 4:52 PM

## 2018-12-04 NOTE — Progress Notes (Signed)
[place] PHYSICAL MEDICINE & REHABILITATION PROGRESS NOTE   Subjective/Complaints: Unable to downsize trach per RRT, CCM planning to attempt today or next wk  ROS- denies CP, SOB,N/V/D Objective:   Dg Chest 2 View  Result Date: 12/03/2018 CLINICAL DATA:  Pt c/o fever x 1 day. Hx of HTN, COPD, AND asthma. Pt is a current smoker. EXAM: CHEST - 2 VIEW COMPARISON:  11/23/2018 FINDINGS: Cardiac silhouette is normal in size. No mediastinal or hilar masses. No convincing adenopathy. Lungs are hyperexpanded. There is streaky opacity at the lung bases most evident on the right. This has developed since chest radiographs from earlier this month. Opacities may be due to atelectasis, infection or a combination. Remainder of the lungs is clear. No pleural effusion or pneumothorax. Tracheostomy tube is stable from the most recent prior exam, well positioned. Surgical vascular clips on the right are consistent with prior right breast surgery. Skeletal structures are demineralized but intact. IMPRESSION: 1. Streaky lung base opacities, most evident in the right lower lobe, similar to the most recent prior exam. These opacities may be due to atelectasis or infection. Bronchopneumonia suspected in the right lower lobe. 2. Underlying COPD.  No evidence of pulmonary edema. Electronically Signed   By: Lajean Manes M.D.   On: 12/03/2018 21:04   No results for input(s): WBC, HGB, HCT, PLT in the last 72 hours. No results for input(s): NA, K, CL, CO2, GLUCOSE, BUN, CREATININE, CALCIUM in the last 72 hours.  Intake/Output Summary (Last 24 hours) at 12/04/2018 0730 Last data filed at 12/03/2018 1250 Gross per 24 hour  Intake 480 ml  Output -  Net 480 ml     Physical Exam: Vital Signs Blood pressure 101/78, pulse 74, temperature 98.2 F (36.8 C), resp. rate 16, height 5\' 10"  (1.778 m), weight 43.9 kg, SpO2 95 %. HEENT:  Trach clean General: No acute distress, cachectic Mood and affect are appropriate Heart:  Regular rate and rhythm no rubs murmurs or extra sounds Lungs: Clear to auscultation, breathing unlabored, no rales or wheezes Abdomen: Positive bowel sounds, soft nontender to palpation, nondistended Extremities: No clubbing, cyanosis, or edema Skin: No evidence of breakdown, no evidence of rash Neurologic: Cranial nerves II through XII intact, motor strength is 4/5 in bilateral deltoid, bicep, tricep, grip, hip flexor, knee extensors, ankle dorsiflexor and plantar flexor, inchanged  Musculoskeletal: Full range of motion in all 4 extremities. No joint swelling    Assessment/Plan: 1. Functional deficits secondary to debilitiy from respiratory failure which require 3+ hours per day of interdisciplinary therapy in a comprehensive inpatient rehab setting.  Physiatrist is providing close team supervision and 24 hour management of active medical problems listed below.  Physiatrist and rehab team continue to assess barriers to discharge/monitor patient progress toward functional and medical goals  Care Tool:  Bathing    Body parts bathed by patient: Right arm, Left arm, Chest, Abdomen, Right upper leg, Left upper leg, Face   Body parts bathed by helper: Front perineal area, Buttocks, Left lower leg, Right lower leg     Bathing assist Assist Level: Moderate Assistance - Patient 50 - 74%     Upper Body Dressing/Undressing Upper body dressing   What is the patient wearing?: Pull over shirt    Upper body assist Assist Level: Moderate Assistance - Patient 50 - 74%    Lower Body Dressing/Undressing Lower body dressing      What is the patient wearing?: Pants     Lower body assist Assist for  lower body dressing: Maximal Assistance - Patient 25 - 49%     Toileting Toileting Toileting Activity did not occur Landscape architect and hygiene only): N/A (no void or bm)  Toileting assist Assist for toileting: Dependent - Patient 0%     Transfers Chair/bed transfer  Transfers  assist     Chair/bed transfer assist level: Dependent - Patient 0%     Locomotion Ambulation   Ambulation assist   Ambulation activity did not occur: Safety/medical concerns          Walk 10 feet activity   Assist  Walk 10 feet activity did not occur: Refused        Walk 50 feet activity   Assist Walk 50 feet with 2 turns activity did not occur: Safety/medical concerns         Walk 150 feet activity   Assist Walk 150 feet activity did not occur: Safety/medical concerns         Walk 10 feet on uneven surface  activity   Assist Walk 10 feet on uneven surfaces activity did not occur: Safety/medical concerns         Wheelchair     Assist   Type of Wheelchair: Manual Wheelchair activity did not occur: Refused         Wheelchair 50 feet with 2 turns activity    Assist    Wheelchair 50 feet with 2 turns activity did not occur: Refused       Wheelchair 150 feet activity     Assist Wheelchair 150 feet activity did not occur: Safety/medical concerns        Medical Problem List and Plan: 1.Debility and metabolic encephalopathysecondary to acute on chronic respiratory failure status 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  DVT prophyllaxis Subcutaneous heparin.no renal failure , switch to Lovenox 3. Pain Management:Tylenol as needed 4. Mood/bipolar disorder:Prozac 40 mg daily for mood stabilization, Klonopin 0.5 mg 3 times a day as needed for anxiety or agitation -monitor sleep patterns -sleep chart 5. Neuropsych: This patientis notcapable of making decisions on herown behalf. 6. Skin/Wound Care:Routine skin checks 7. Fluids/Electrolytes/Nutrition:Routine in and out's Meal intake good, fluid intake fair but BMET shows no pre renal azotemia, will d/c PICC 8. Chronic hypotension. Patient had been on ProAmatine 10 mg 3 times a  day as well as Florinef prior to admission.Resume as neededpendng tolerance of therapy Vitals:   12/04/18 0408 12/04/18 0723  BP: 101/78   Pulse: 72 74  Resp: 17 16  Temp: 98.2 F (36.8 C)   SpO2: 100% 95%  pending 2/28 9.COPD with history of tobacco abuse. Patient on 2 L oxygen at home.Continue chronic prednisone. Follow-up pulmonary services Dr.Wert.Continue nebulizersas directed, would like to wean trach currently #6 cuffec , ask CCM Streaky atelectasis on 2/27 CXR no fever, per CCM 10. Seizure disorder. ContinueDepakoteas prior to admission.. EEG negative No clinical seizure activity 11.Hypothyroidism. Synthroid 12. Dysphagia. Dysphagia #2 nectar liquids. Follow-up speech therapy. Monitor hydration. 13. Polysubstance abuse. UDS positive for cocaine. Provide counseling 14. History of breast cancer with lumpectomy. Patient on Arimidex 1 mg daily prior to admission.OK to restart per Dr Burr Medico Heme/Onc also Dr Burr Medico wants pt to f/u since she hasn't been seen since 2018  15.  Leukocytosis with chronic steroid use, afeb  LOS: 7 days A FACE TO FACE EVALUATION WAS PERFORMED  Charlett Blake 12/04/2018, 7:30 AM

## 2018-12-04 NOTE — Progress Notes (Signed)
Physical Therapy Session Note  Patient Details  Name: AMBERA Terry MRN: [id number] Date of Birth: 1961/06/21  Today's Date: 12/04/2018 PT Individual Time: 1510-1550 PT Individual Time Calculation (min): 40 min   Short Term Goals: Week 1:  PT Short Term Goal 1 (Week 1): Pt will performed sit<>stand with mod assist  PT Short Term Goal 2 (Week 1): Pt will perform bed mobility with supevision assist  PT Short Term Goal 3 (Week 1): Pt will performed bed<>chair with stand pivot and mod assist   PT Short Term Goal 4 (Week 1): Pt will abmulate 36ft with mod assist and LRAD  PT Short Term Goal 5 (Week 1): Pt will propell WC 63ft with min assist   Skilled Therapeutic Interventions/Progress Updates:    Pt received seated in bed, pt initially refuses participation in therapy session. Pt then requests pudding. Pt is setup A to feed herself pudding with use of RUE. RN requests that pt transfer out of bed so that he can set pt up with new air mattress. Pt agreeable with encouragement to transfer to w/c. Supine to sit with mod A. Squat pivot transfer bed to w/c with CGA, pt refuses any assist from therapist for transfer. Squat pivot transfer w/c to new bed with mod A due to decreased assist and effort from pt despite pt requesting to return to bed. Sit to supine mod A. Pt left supine in bed in care of RN. Pt vitals stable throughout session.  Therapy Documentation Precautions:  Precautions Precautions: Fall Precaution Comments: Trach collar, supplemental 02 Restrictions Weight Bearing Restrictions: No    Therapy/Group: Individual Therapy  Excell Seltzer, PT, DPT  12/04/2018, 4:51 PM

## 2018-12-04 NOTE — Progress Notes (Signed)
Speech Language Pathology Weekly Progress and Session Note  Patient Details  Name: Courtney Terry MRN: [id number] Date of Birth: 1960/12/23  Beginning of progress report period: November 28, 2018 End of progress report period: December 04, 2018  Today's Date: 12/04/2018 SLP Individual Time: 0830-0930 SLP Individual Time Calculation (min): 60 min  Short Term Goals: Week 1: SLP Short Term Goal 1 (Week 1): Patient will consume current diet with minimal overt s/s of aspiration and supervision verbal cues for use of swallowing compensatory srategies.  SLP Short Term Goal 1 - Progress (Week 1): Not met SLP Short Term Goal 2 (Week 1): Patient will consume trials of thin liquids without overt s/s of aspiration over 3 sessions prior to upgrade with supervision verbal cues.  SLP Short Term Goal 2 - Progress (Week 1): Not met SLP Short Term Goal 3 (Week 1): Patient will consume trials of Dys. 3 textures with efficient mastication and complete oral clearance over 2 sessions prior to upgrade.  SLP Short Term Goal 3 - Progress (Week 1): Not met SLP Short Term Goal 4 (Week 1): Patient will demonstrate sustained attention to tasks for 10 minutes with Mod A verbal cues for redirection.  SLP Short Term Goal 4 - Progress (Week 1): Not met SLP Short Term Goal 5 (Week 1): Patient will utilize multimodal communication (gestures, mouthing words, communication board) to express wants/needs with Mod A mulitmodal cues.  SLP Short Term Goal 5 - Progress (Week 1): Not met SLP Short Term Goal 6 (Week 1): Patient will tolerate PMSV for ~1 minute with all vitals remaining WFL without signs of distress with Max A multimodal cues.  SLP Short Term Goal 6 - Progress (Week 1): Not met    New Short Term Goals: Week 2: SLP Short Term Goal 1 (Week 2): Patient will consume current diet with minimal overt s/s of aspiration and Mod A verbal cues for use of swallowing compensatory srategies.  SLP Short Term Goal 2 (Week 2):  Patient will consume trials of thin liquids without overt s/s of aspiration over 3 sessions prior to upgrade with Mod verbal cues.  SLP Short Term Goal 3 (Week 2): Patient will consume trials of Dys. 3 textures with efficient mastication and complete oral clearance over 2 sessions prior to upgrade.  SLP Short Term Goal 4 (Week 2): Patient will demonstrate sustained attention to tasks for 10 minutes with Mod A verbal cues for redirection.  SLP Short Term Goal 5 (Week 2): Patient will utilize multimodal communication (gestures, mouthing words, communication board) to express wants/needs with Mod A mulitmodal cues.  SLP Short Term Goal 6 (Week 2): Patient will tolerate PMSV for ~1 minute with all vitals remaining WFL without signs of distress with Max A multimodal cues.   Weekly Progress Updates: Pt has made slow progress this week but as such she has not met any of her STGs. Pt progress has been slower than projected d/t trach size, fever, decreased awareness of need for therapy and co-dependent relationship with significant other. At this time, pulmonary are attempting to downsize pt's trach to help with PMV tolerance and to progress pt towards hope of eventual decannulation. At this time (#6 cuffed trach) pt is not able to tolerate PMV for more than 30 seconds but is able to produce phonation when SLP provides finger occlusion to achieve ~ 75% intelligibility at the simple phrase level. Pt also expresses herself with nonverbal and aphonic mouthing of words. When consuming PO intake, she is easily fatigued  by consumption and experiences discordinated respiratory pattern. She is currently Max A to Mod A for tasks. Skilled St continues to be required to target the above mentioned goals, increased safety with PO consumption, increase overall respiratory endurance and independence.      Intensity: Minumum of 1-2 x/day, 30 to 90 minutes Frequency: 3 to 5 out of 7 days Duration/Length of Stay: 14-16 days   Treatment/Interventions: Cognitive remediation/compensation;Environmental controls;Internal/external aids;Speech/Language facilitation;Therapeutic Activities;Patient/family education;Functional tasks;Dysphagia/aspiration precaution training;Cueing hierarchy   Daily Session  Skilled Therapeutic Interventions: Skilled treatment session focused on dysphagia, communication and education. SLP facilitated session by providing Max A for self-feeding dysphagia 2 breakfast with nectar thick liquids via cup. Pt requires rest breaks to decrease respiratory fatigue. Pt was very restless during session d/t 8 out 10 pain to wound on lower back. SLP aided pt in frequent repositioning but pain was interferring factor during session. Charge nurse made aware. Pt able to communicate request for graham crackers with multiple attempts at mouthing message. Pt consuming with good oral clearing. Pt with improved cough and ability to expel clear secretions through trach hub. SLP also reviewed current plan of care with nursing (I.e., not leaving food in patient's room) as [name] was present during session with frequent pleasant requests to be allowed to feed pt. [name] was easily redirected but persistent. Pt was left repositioned in bed (semi-reclined) with charge nurse aware of pain. Continue per current plan of care.     General    Pain Pain Assessment Pain Scale: 0-10 Pain Score: 8  Pain Type: Acute pain Pain Location: Buttocks Pain Descriptors / Indicators: Aching;Burning;Crying;Discomfort;Restless Patients Stated Pain Goal: 2 Pain Intervention(s): RN made aware;Emotional support;Distraction;Repositioned(charge nurse made aware as well) Multiple Pain Sites: No  Therapy/Group: Individual Therapy  Sun Wilensky 12/04/2018, 9:54 AM

## 2018-12-05 ENCOUNTER — Inpatient Hospital Stay (HOSPITAL_COMMUNITY): Payer: Medicaid Other | Admitting: Occupational Therapy

## 2018-12-05 LAB — GLUCOSE, CAPILLARY
GLUCOSE-CAPILLARY: 84 mg/dL (ref 70–99)
Glucose-Capillary: 99 mg/dL (ref 70–99)
Glucose-Capillary: 92 mg/dL (ref 70–99)
Glucose-Capillary: 74 mg/dL (ref 70–99)

## 2018-12-05 MED ORDER — RESOURCE THICKENUP CLEAR PO POWD
ORAL | Status: DC | PRN
  Filled 2018-12-05: qty 125

## 2018-12-05 NOTE — Progress Notes (Signed)
Received in report patient prefers medications in strawberry yogurt. Medication was given in said strawberry yogurt however, patient required max cueing to take medications. Patient required max cueing on need for medications as she was refusing said medications. Friend "[name]" was in the room also prompting patient to take medications because "she needed to get stronger to get out of here." Patient received all but the Depakote ER 24 hr tablet 250 mg, which she refused.

## 2018-12-05 NOTE — Progress Notes (Signed)
Occupational Therapy Session Note  Patient Details  Name: Courtney Terry MRN: [id number] Date of Birth: [birth year]  Today's Date: 12/05/2018 OT Individual Time: 1455-1540 OT Individual Time Calculation (min): 45 min   Short Term Goals: Week 1:  OT Short Term Goal 1 (Week 1): Pt will thread 1 LE into pants with supervision  OT Short Term Goal 1 - Progress (Week 1): Not met OT Short Term Goal 2 (Week 1): Pt will complete sit<stand for bathing with Min A and LRAD  OT Short Term Goal 2 - Progress (Week 1): Not met OT Short Term Goal 3 (Week 1): Pt will complete toilet transfer with Min A and LRAD OT Short Term Goal 3 - Progress (Week 1): Not met  Skilled Therapeutic Interventions/Progress Updates:    Pt greeted in bed with visitors present. Indicating that she had soiled brief. Pt rolled Rt>Lt with Min A while OT completed perihygiene post large BM and brief change. Pt able to assist with frontal hygiene given instruction. She declined donning pants after or getting EOB. Pt mouthing "magic cup." After hand hygiene completion, set pt up in bed with magic cup and weighted built up spoon. Pt agreeable to feed herself using AE with increased time and vcs for small bites. At end of session pt was left with RN to take medication.    Therapy Documentation Precautions:  Precautions Precautions: Fall Precaution Comments: Trach collar, supplemental 02 Restrictions Weight Bearing Restrictions: No Vital Signs: Therapy Vitals Pulse Rate: 89 Resp: 18 Patient Position (if appropriate): Lying Oxygen Therapy SpO2: 98 % O2 Device: Tracheostomy Collar FiO2 (%): 35 % Pain: No s/s pain during tx    ADL: ADL Eating: Not assessed Grooming: Supervision/safety Where Assessed-Grooming: Edge of bed Upper Body Bathing: Minimal assistance(for thoroughness) Where Assessed-Upper Body Bathing: Edge of bed Lower Body Bathing: Maximal assistance Where Assessed-Lower Body Bathing: Bed level, Edge of  bed Upper Body Dressing: Maximal assistance Where Assessed-Upper Body Dressing: Edge of bed Lower Body Dressing: Maximal assistance Where Assessed-Lower Body Dressing: Edge of bed Toileting: Not assessed Toilet Transfer: Moderate assistance Toilet Transfer Method: Stand pivot, Squat pivot Toilet Transfer Equipment: Bedside commode, Drop arm bedside commode Tub/Shower Transfer: Not assessed      Therapy/Group: Individual Therapy  Aahna Rossa A Basil Blakesley 12/05/2018, 4:23 PM

## 2018-12-05 NOTE — Progress Notes (Addendum)
Occupational Therapy Weekly Progress Note  Patient Details  Name: Courtney Terry MRN: [id number] Date of Birth: [birth year]  Beginning of progress report period: 11/28/18 End of progress report period: 12/05/18  Patient has met 0 of 3 short term goals.    Patient has made limited progress at time of report. She continues to exhibit self limiting behaviors during sessions, such as refusing tx participation, and refusing to attempt to increase her functional independence during self care. At time of evaluation, she required Max A for bathing tasks and Max A for UB dressing. At time of report, she requires Mod A to complete stated tasks. Will continue working towards LTG achievement with plan for pt to d/c SNF for further rehabilitation services.    Patient continues to demonstrate the following deficits: muscle weakness, decreased cardiorespiratoy endurance, decreased safety awareness and decreased sitting balance and decreased standing balance and therefore will continue to benefit from skilled OT intervention to enhance overall performance with BADL.  Patient is not progressing toward long term goals.. See plan of care revisions.   OT Short Term Goals Week 1:  OT Short Term Goal 1 (Week 1): Pt will thread 1 LE into pants with supervision  OT Short Term Goal 1 - Progress (Week 1): Not met OT Short Term Goal 2 (Week 1): Pt will complete sit<stand for bathing with Min A and LRAD  OT Short Term Goal 2 - Progress (Week 1): Not met OT Short Term Goal 3 (Week 1): Pt will complete toilet transfer with Min A and LRAD OT Short Term Goal 3 - Progress (Week 1): Not met Week 2:  OT Short Term Goal 1 (Week 2): STGs=LTGs due to ELOS   Therapy Documentation Precautions:  Precautions Precautions: Fall Precaution Comments: Trach collar, supplemental 02 Restrictions Weight Bearing Restrictions: No Vital Signs: Therapy Vitals Temp: 97.7 F (36.5 C) Pulse Rate: 86 Resp: 17 BP: 105/70 Patient  Position (if appropriate): Lying Oxygen Therapy SpO2: 100 % O2 Device: Room Air ADL: ADL Eating: Not assessed Grooming: Supervision/safety Where Assessed-Grooming: Edge of bed Upper Body Bathing: Minimal assistance(for thoroughness) Where Assessed-Upper Body Bathing: Edge of bed Lower Body Bathing: Maximal assistance Where Assessed-Lower Body Bathing: Bed level, Edge of bed Upper Body Dressing: Maximal assistance Where Assessed-Upper Body Dressing: Edge of bed Lower Body Dressing: Maximal assistance Where Assessed-Lower Body Dressing: Edge of bed Toileting: Not assessed Toilet Transfer: Moderate assistance Toilet Transfer Method: Stand pivot, Squat pivot Toilet Transfer Equipment: Bedside commode, Drop arm bedside commode Tub/Shower Transfer: Not assessed  Therapy/Group: Individual Therapy  Travious Vanover A Miah Boye 12/05/2018, 7:30 AM

## 2018-12-05 NOTE — Progress Notes (Signed)
[facility] PHYSICAL MEDICINE & REHABILITATION PROGRESS NOTE   Subjective/Complaints: Unable to downsize trach per RRT, CCM planning to attempt next wk  ROS- denies CP, SOB,N/V/D Objective:   Dg Chest 2 View  Result Date: 12/03/2018 CLINICAL DATA:  Pt c/o fever x 1 day. Hx of HTN, COPD, AND asthma. Pt is a current smoker. EXAM: CHEST - 2 VIEW COMPARISON:  11/23/2018 FINDINGS: Cardiac silhouette is normal in size. No mediastinal or hilar masses. No convincing adenopathy. Lungs are hyperexpanded. There is streaky opacity at the lung bases most evident on the right. This has developed since chest radiographs from earlier this month. Opacities may be due to atelectasis, infection or a combination. Remainder of the lungs is clear. No pleural effusion or pneumothorax. Tracheostomy tube is stable from the most recent prior exam, well positioned. Surgical vascular clips on the right are consistent with prior right breast surgery. Skeletal structures are demineralized but intact. IMPRESSION: 1. Streaky lung base opacities, most evident in the right lower lobe, similar to the most recent prior exam. These opacities may be due to atelectasis or infection. Bronchopneumonia suspected in the right lower lobe. 2. Underlying COPD.  No evidence of pulmonary edema. Electronically Signed   By: Lajean Manes M.D.   On: 12/03/2018 21:04   No results for input(s): WBC, HGB, HCT, PLT in the last 72 hours. No results for input(s): NA, K, CL, CO2, GLUCOSE, BUN, CREATININE, CALCIUM in the last 72 hours.  Intake/Output Summary (Last 24 hours) at 12/05/2018 1129 Last data filed at 12/05/2018 0921 Gross per 24 hour  Intake 120 ml  Output -  Net 120 ml     Physical Exam: Vital Signs Blood pressure 105/70, pulse 73, temperature 97.7 F (36.5 C), resp. rate 18, height 5\' 10"  (1.778 m), weight 67.6 kg, SpO2 97 %. HEENT:  Trach clean General: No acute distress, cachectic Mood and affect are appropriate Heart: Regular  rate and rhythm no rubs murmurs or extra sounds Lungs: Clear to auscultation, breathing unlabored, no rales or wheezes Abdomen: Positive bowel sounds, soft nontender to palpation, nondistended Extremities: No clubbing, cyanosis, or edema Skin: No evidence of breakdown, no evidence of rash Neurologic: Cranial nerves II through XII intact, motor strength is 4/5 in bilateral deltoid, bicep, tricep, grip, hip flexor, knee extensors, ankle dorsiflexor and plantar flexor, inchanged  Musculoskeletal: Full range of motion in all 4 extremities. No joint swelling    Assessment/Plan: 1. Functional deficits secondary to debilitiy from respiratory failure which require 3+ hours per day of interdisciplinary therapy in a comprehensive inpatient rehab setting.  Physiatrist is providing close team supervision and 24 hour management of active medical problems listed below.  Physiatrist and rehab team continue to assess barriers to discharge/monitor patient progress toward functional and medical goals  Care Tool:  Bathing    Body parts bathed by patient: Right arm, Left arm, Chest, Abdomen, Right upper leg, Left upper leg, Face   Body parts bathed by helper: Front perineal area, Buttocks, Left lower leg, Right lower leg     Bathing assist Assist Level: Moderate Assistance - Patient 50 - 74%     Upper Body Dressing/Undressing Upper body dressing   What is the patient wearing?: Pull over shirt    Upper body assist Assist Level: Moderate Assistance - Patient 50 - 74%    Lower Body Dressing/Undressing Lower body dressing      What is the patient wearing?: Pants     Lower body assist Assist for lower body  dressing: Maximal Assistance - Patient 25 - 49%     Toileting Toileting Toileting Activity did not occur (Clothing management and hygiene only): N/A (no void or bm)  Toileting assist Assist for toileting: Dependent - Patient 0%     Transfers Chair/bed transfer  Transfers assist      Chair/bed transfer assist level: Moderate Assistance - Patient 50 - 74%     Locomotion Ambulation   Ambulation assist   Ambulation activity did not occur: Safety/medical concerns          Walk 10 feet activity   Assist  Walk 10 feet activity did not occur: Refused        Walk 50 feet activity   Assist Walk 50 feet with 2 turns activity did not occur: Safety/medical concerns         Walk 150 feet activity   Assist Walk 150 feet activity did not occur: Safety/medical concerns         Walk 10 feet on uneven surface  activity   Assist Walk 10 feet on uneven surfaces activity did not occur: Safety/medical concerns         Wheelchair     Assist   Type of Wheelchair: Manual Wheelchair activity did not occur: Refused         Wheelchair 50 feet with 2 turns activity    Assist    Wheelchair 50 feet with 2 turns activity did not occur: Refused       Wheelchair 150 feet activity     Assist Wheelchair 150 feet activity did not occur: Safety/medical concerns        Medical Problem List and Plan: 1.Debility and metabolic encephalopathysecondary to acute on chronic respiratory failure status 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  DVT prophyllaxis Subcutaneous heparin.no renal failure , switch to Lovenox 3. Pain Management:Tylenol as needed 4. Mood/bipolar disorder:Prozac 40 mg daily for mood stabilization, Klonopin 0.5 mg 3 times a day as needed for anxiety or agitation -monitor sleep patterns -sleep chart 5. Neuropsych: This patientis notcapable of making decisions on herown behalf. 6. Skin/Wound Care:Routine skin checks 7. Fluids/Electrolytes/Nutrition:Routine in and out's PICC removed  8. Chronic hypotension. Patient had been on ProAmatine 10 mg 3 times a day as well as Florinef prior to admission.Resume as neededpendng  tolerance of therapy Vitals:   12/05/18 0811 12/05/18 0812  BP:    Pulse: 73   Resp: 18   Temp:    SpO2: 97% 97%  pending 2/28 9.COPD with history of tobacco abuse. Patient on 2 L oxygen at home.Continue chronic prednisone. Follow-up pulmonary services Dr.Wert.Continue nebulizersas directed, would like to wean trach currently #6 cuffec , ask CCM Streaky atelectasis and ? bronchoPNA no wheezing or cough on 2/27 CXR no fever, per CCM 10. Seizure disorder. ContinueDepakoteas prior to admission.. EEG negative No clinical seizure activity 11.Hypothyroidism. Synthroid 12. Dysphagia. Dysphagia #2 nectar liquids. Follow-up speech therapy. Monitor hydration. 13. Polysubstance abuse. UDS positive for cocaine. Provide counseling 14. History of breast cancer with lumpectomy. Patient on Arimidex 1 mg daily prior to admission.OK to restart per Dr Burr Medico Heme/Onc also Dr Burr Medico wants pt to f/u since she hasn't been seen since 2018  15.  Leukocytosis with chronic steroid use, afeb  LOS: 8 days A FACE TO FACE EVALUATION WAS PERFORMED  Charlett Blake 12/05/2018, 11:29 AM

## 2018-12-06 ENCOUNTER — Inpatient Hospital Stay (HOSPITAL_COMMUNITY): Payer: Medicaid Other | Admitting: Occupational Therapy

## 2018-12-06 LAB — GLUCOSE, CAPILLARY
Glucose-Capillary: 79 mg/dL (ref 70–99)
Glucose-Capillary: 96 mg/dL (ref 70–99)
Glucose-Capillary: 93 mg/dL (ref 70–99)

## 2018-12-06 NOTE — Progress Notes (Addendum)
Patient adamantly refused all her meds in the morning; RN explained the need to take meds and offered 3x but patient cusses and closes her eyes.Witnessed by NT as well. MD made aware.

## 2018-12-06 NOTE — Progress Notes (Signed)
RN was able to convince patient to take her afternoon dose of depakote and her prednisone.

## 2018-12-06 NOTE — Plan of Care (Signed)
  Problem: RH BOWEL ELIMINATION Goal: RH STG MANAGE BOWEL WITH ASSISTANCE Description STG Manage Bowel with min Assistance.  Outcome: Not Progressing; incontinence   Problem: RH KNOWLEDGE DEFICIT GENERAL Goal: RH STG INCREASE KNOWLEDGE OF SELF CARE AFTER HOSPITALIZATION Description Family will be able to explain pt care needs and demonstrate ability to perform using handouts and resources independently  Outcome: Not Progressing; uncooperative

## 2018-12-06 NOTE — Progress Notes (Signed)
Occupational Therapy Session Note  Patient Details  Name: CHRYSTEL BAREFIELD MRN: [id number] Date of Birth: 1960/10/19  Today's Date: 12/06/2018 OT Missed Time: 70 Minutes Missed Time Reason: Patient fatigue;Patient unwilling/refused to participate without medical reason   Skilled Therapeutic Interventions/Progress Updates:    Pt refused participation in group tx due to fatigue and not wanting to get OOB. 60 minutes missed.   Therapy Documentation Precautions:  Precautions Precautions: Fall Precaution Comments: Trach collar, supplemental 02 Restrictions Weight Bearing Restrictions: No ADL: ADL Eating: Not assessed Grooming: Supervision/safety Where Assessed-Grooming: Edge of bed Upper Body Bathing: Minimal assistance(for thoroughness) Where Assessed-Upper Body Bathing: Edge of bed Lower Body Bathing: Maximal assistance Where Assessed-Lower Body Bathing: Bed level, Edge of bed Upper Body Dressing: Maximal assistance Where Assessed-Upper Body Dressing: Edge of bed Lower Body Dressing: Maximal assistance Where Assessed-Lower Body Dressing: Edge of bed Toileting: Not assessed Toilet Transfer: Moderate assistance Toilet Transfer Method: Stand pivot, Squat pivot Toilet Transfer Equipment: Bedside commode, Drop arm bedside commode Tub/Shower Transfer: Not assessed     Therapy/Group: GroupTherapy  Alphonso Gregson A Treyce Spillers 12/06/2018, 4:12 PM

## 2018-12-06 NOTE — Progress Notes (Signed)
[place] PHYSICAL MEDICINE & REHABILITATION PROGRESS NOTE   Subjective/Complaints: Patient refused meds this morning.  ROS- denies CP, SOB,N/V/D Objective:   No results found. No results for input(s): WBC, HGB, HCT, PLT in the last 72 hours. No results for input(s): NA, K, CL, CO2, GLUCOSE, BUN, CREATININE, CALCIUM in the last 72 hours.  Intake/Output Summary (Last 24 hours) at 12/06/2018 1101 Last data filed at 12/06/2018 0945 Gross per 24 hour  Intake 378 ml  Output -  Net 378 ml     Physical Exam: Vital Signs Blood pressure 110/78, pulse 72, temperature 98.6 F (37 C), resp. rate 16, height 5\' 10"  (1.778 m), weight 67.6 kg, SpO2 96 %. HEENT:  Trach clean General: No acute distress, cachectic Mood and affect are appropriate Heart: Regular rate and rhythm no rubs murmurs or extra sounds Lungs: Clear to auscultation, breathing unlabored, no rales or wheezes Abdomen: Positive bowel sounds, soft nontender to palpation, nondistended Extremities: No clubbing, cyanosis, or edema Skin: No evidence of breakdown, no evidence of rash Neurologic: Cranial nerves II through XII intact, motor strength is 4/5 in bilateral deltoid, bicep, tricep, grip, hip flexor, knee extensors, ankle dorsiflexor and plantar flexor, inchanged  Musculoskeletal: Full range of motion in all 4 extremities. No joint swelling    Assessment/Plan: 1. Functional deficits secondary to debilitiy from respiratory failure which require 3+ hours per day of interdisciplinary therapy in a comprehensive inpatient rehab setting.  Physiatrist is providing close team supervision and 24 hour management of active medical problems listed below.  Physiatrist and rehab team continue to assess barriers to discharge/monitor patient progress toward functional and medical goals  Care Tool:  Bathing  Bathing activity did not occur: Refused Body parts bathed by patient: Right arm, Left arm, Chest, Abdomen, Right upper leg,  Left upper leg, Face   Body parts bathed by helper: Front perineal area, Buttocks, Left lower leg, Right lower leg     Bathing assist Assist Level: Moderate Assistance - Patient 50 - 74%     Upper Body Dressing/Undressing Upper body dressing   What is the patient wearing?: Pull over shirt    Upper body assist Assist Level: Moderate Assistance - Patient 50 - 74%    Lower Body Dressing/Undressing Lower body dressing      What is the patient wearing?: Pants     Lower body assist Assist for lower body dressing: Maximal Assistance - Patient 25 - 49%     Toileting Toileting Toileting Activity did not occur Landscape architect and hygiene only): Refused  Toileting assist Assist for toileting: Maximal Assistance - Patient 25 - 49%     Transfers Chair/bed transfer  Transfers assist     Chair/bed transfer assist level: Moderate Assistance - Patient 50 - 74%     Locomotion Ambulation   Ambulation assist   Ambulation activity did not occur: Safety/medical concerns          Walk 10 feet activity   Assist  Walk 10 feet activity did not occur: Refused        Walk 50 feet activity   Assist Walk 50 feet with 2 turns activity did not occur: Safety/medical concerns         Walk 150 feet activity   Assist Walk 150 feet activity did not occur: Safety/medical concerns         Walk 10 feet on uneven surface  activity   Assist Walk 10 feet on uneven surfaces activity did not occur: Safety/medical concerns  Wheelchair     Assist   Type of Wheelchair: Educational psychologist activity did not occur: Refused         Wheelchair 50 feet with 2 turns activity    Assist    Wheelchair 50 feet with 2 turns activity did not occur: Refused       Wheelchair 150 feet activity     Assist Wheelchair 150 feet activity did not occur: Safety/medical concerns        Medical Problem List and Plan: 1.Debility and metabolic  encephalopathysecondary to acute on chronic respiratory failure status 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  DVT prophyllaxis Subcutaneous heparin.no renal failure , switch to Lovenox 3. Pain Management:Tylenol as needed 4. Mood/bipolar disorder:Prozac 40 mg daily for mood stabilization, Klonopin 0.5 mg 3 times a day as needed for anxiety or agitation -monitor sleep patterns -sleep chart 5. Neuropsych: This patientis notcapable of making decisions on herown behalf. 6. Skin/Wound Care:Routine skin checks 7. Fluids/Electrolytes/Nutrition:Routine in and out's PICC removed  8. Chronic hypotension. Patient had been on ProAmatine 10 mg 3 times a day as well as Florinef prior to admission.Resume as neededpendng tolerance of therapy Vitals:   12/06/18 0647 12/06/18 0905  BP: 110/78   Pulse: 72   Resp: 16   Temp:    SpO2: 97% 96%  pending 2/28 9.COPD with history of tobacco abuse. Patient on 2 L oxygen at home.Continue chronic prednisone. Follow-up pulmonary services Dr.Wert.Continue nebulizersas directed, would like to wean trach currently #6 cuffec , respiratory therapy was unable to remove, pulmonary to follow-up tomorrow  2/27 CXR no fever, per CCM 10. Seizure disorder. ContinueDepakoteas prior to admission.. EEG negative No clinical seizure activity 11.Hypothyroidism. Synthroid 12. Dysphagia. Dysphagia #2 nectar liquids. Follow-up speech therapy. Monitor hydration. 13. Polysubstance abuse. UDS positive for cocaine. Provide counseling 14. History of breast cancer with lumpectomy. Patient on Arimidex 1 mg daily prior to admission.OK to restart per Dr Burr Medico Heme/Onc also Dr Burr Medico wants pt to f/u since she hasn't been seen since 2018  15.  Leukocytosis with chronic steroid use, afeb 16.  Medication compliance, patient has a history of this, had not followed up with heme-onc for about 2  years, will ask neuropsychology to evaluate LOS: 9 days A FACE TO FACE EVALUATION WAS PERFORMED  Charlett Blake 12/06/2018, 11:01 AM

## 2018-12-07 ENCOUNTER — Inpatient Hospital Stay (HOSPITAL_COMMUNITY): Payer: Medicaid Other

## 2018-12-07 ENCOUNTER — Inpatient Hospital Stay (HOSPITAL_COMMUNITY): Payer: Medicaid Other | Admitting: Physical Therapy

## 2018-12-07 ENCOUNTER — Inpatient Hospital Stay (HOSPITAL_COMMUNITY): Payer: Medicaid Other | Admitting: Occupational Therapy

## 2018-12-07 DIAGNOSIS — I950 Idiopathic hypotension: Secondary | ICD-10-CM

## 2018-12-07 DIAGNOSIS — G40909 Epilepsy, unspecified, not intractable, without status epilepticus: Secondary | ICD-10-CM

## 2018-12-07 DIAGNOSIS — R1312 Dysphagia, oropharyngeal phase: Secondary | ICD-10-CM

## 2018-12-07 DIAGNOSIS — J9601 Acute respiratory failure with hypoxia: Secondary | ICD-10-CM

## 2018-12-07 LAB — GLUCOSE, CAPILLARY
Glucose-Capillary: 87 mg/dL (ref 70–99)
Glucose-Capillary: 123 mg/dL — ABNORMAL HIGH (ref 70–99)
Glucose-Capillary: 152 mg/dL — ABNORMAL HIGH (ref 70–99)
Glucose-Capillary: 96 mg/dL (ref 70–99)

## 2018-12-07 MED ORDER — FAMOTIDINE 20 MG PO TABS
20.0000 mg | ORAL_TABLET | Freq: Two times a day (BID) | ORAL | Status: DC
  Administered 2018-12-07 – 2018-12-24 (×16): 20 mg via ORAL
  Filled 2018-12-07 (×21): qty 1

## 2018-12-07 NOTE — Progress Notes (Signed)
Occupational Therapy Session Note  Patient Details  Name: Courtney Terry MRN: [id number] Date of Birth: 04/22/1961  Today's Date: 12/07/2018 OT Individual Time: 1420-1529 OT Individual Time Calculation (min): 69 min   Short Term Goals: Week 2:  OT Short Term Goal 1 (Week 2): STGs=LTGs due to ELOS     Skilled Therapeutic Interventions/Progress Updates:    Pt greeted in bed, requesting magic cup. Able to boost herself up in bed with it was briefly placed in trendelenberg position. Pt refused to sit in neutral due to buttocks pain, and therefore leaned to Lt side. Issued her new built up spoon as the first one was missing. Pt ate magic cup with supervision and began sliding down bed. Refused to boost herself up again to continue eating safely. During safety education, several RTs arrived to change out trach. Pts tremor activity markedly increased and she shook her head and reached out when OT attempted to leave. OT held pts hand and provided calming cues so that she would allow for RTs to change out trach as she was pushing them away. When RTs left, pt indicated that she now had soiled her brief. 2 assist (RN) for perihygiene due to soiled sacral dressing and needing pressure ulcer care. OT and RN provided max encouragement for pt to boost herself up in bed to consume pain medication. With increased time, pt eventually agreed and assisted with boosting up, but would not tolerate HOB elevation more than 40 degrees. Max A for donning pants and gripper socks due to pt refusal to assist. Pt left with all needs within reach and 4 bedrails up, refusing to participate in further tx or get OOB.    02 sats stable throughout session via trach collar.    Therapy Documentation Precautions:  Precautions Precautions: Fall Precaution Comments: Trach collar, supplemental 02 Restrictions Weight Bearing Restrictions: No Vital Signs: Therapy Vitals Temp: 99.6 F (37.6 C) Temp Source: Oral Pulse Rate: (!)  103 Resp: (!) 22 BP: 101/66 Patient Position (if appropriate): Lying Oxygen Therapy SpO2: 100 % O2 Device: Room Air O2 Flow Rate (L/min): 8 L/min FiO2 (%): 35 % Pain: in buttocks. RN in to provide pain medication, however pt refused to sit upright safely to take it  Pain Assessment Pain Score: 0-No pain ADL: ADL Eating: Not assessed Grooming: Supervision/safety Where Assessed-Grooming: Edge of bed Upper Body Bathing: Minimal assistance(for thoroughness) Where Assessed-Upper Body Bathing: Edge of bed Lower Body Bathing: Maximal assistance Where Assessed-Lower Body Bathing: Bed level, Edge of bed Upper Body Dressing: Maximal assistance Where Assessed-Upper Body Dressing: Edge of bed Lower Body Dressing: Maximal assistance Where Assessed-Lower Body Dressing: Edge of bed Toileting: Not assessed Toilet Transfer: Moderate assistance Toilet Transfer Method: Stand pivot, Squat pivot Toilet Transfer Equipment: Bedside commode, Drop arm bedside commode Tub/Shower Transfer: Not assessed      Therapy/Group: Individual Therapy  Britani Beattie A Shalunda Lindh 12/07/2018, 3:59 PM

## 2018-12-07 NOTE — NC FL2 (Signed)
[place] LEVEL OF CARE SCREENING TOOL     IDENTIFICATION  Patient Name: Courtney Terry Birthdate: [birth year] Sex: female Admission Date (Current Location): 11/27/2018  [place] and [place] Number:  Courtney Terry [id number] [place] and Address:  [facility], [place] [address] [place], [place] [address]      Provider Number: [id number]  Attending Physician Name and Address:  Meredith Staggers, MD  Relative Name and Phone Number:       Current Level of Care: Other (Comment)(Acute Inpatient Rehab) Recommended Level of Care: [place] Prior Approval Number:    Date Approved/Denied:   PASRR Number: [id number]  Discharge Plan: SNF    Current Diagnoses: Patient Active Problem List   Diagnosis Date Noted  . Tracheostomy care ([place])   . Metabolic encephalopathy 39/12/90  . Tracheostomy status ([place])   . Acute respiratory failure with hypoxia and hypercapnia ([place]) 11/08/2018  . Severely underweight adult 09/13/2018  . COPD with acute exacerbation ([place]) 09/12/2018  . Syncope 09/12/2018  . Tobacco use disorder 05/01/2018  . Depression 01/26/2018  . CHF (congestive heart failure) ([place]) 11/11/2017  . PID (acute pelvic inflammatory disease) 11/11/2017  . Seizures ([place])   . Hypertension   . Palliative care encounter   . Goals of care, counseling/discussion   . Hypoxia   . Rhinovirus infection 09/30/2016  . Hypothyroidism 09/29/2016  . Bipolar I disorder ([place]) 09/29/2016  . Acute and chronic respiratory failure with hypercapnia ([place]) 09/28/2016  . Polysubstance abuse ([place]) 08/13/2016  . Cocaine abuse ([place]) 08/13/2016  . Schizophrenia ([place]) 04/23/2016  . COPD (chronic obstructive pulmonary disease) ([place]) 04/23/2016  . Chronic respiratory failure with hypoxia ([place]) 03/29/2016  . Acute on chronic respiratory failure ([place]) 02/28/2016  . Protein-calorie malnutrition, severe 02/27/2016  . Elevated troponin   . History of breast  cancer 10/20/2015  . Exposure of implanted prstht mtrl to surrnd org/tiss, init 06/19/2015  . Complication of internal breast prosthesis 06/19/2015  . Acquired absence of breast and nipple 06/06/2015  . H/O right mastectomy 06/06/2015  . COPD exacerbation ([place]) 06/29/2014  . COPD GOLD IV D 02/27/2013  . Malignant neoplasm of upper-outer quadrant of female breast ([place]) 01/31/2012  . Epilepsy ([place]) 12/02/2007    Orientation RESPIRATION BLADDER Height & Weight     Self  Tracheostomy Incontinent Weight: 70.8 kg Height:  5\' 10"  (177.8 cm)  BEHAVIORAL SYMPTOMS/MOOD NEUROLOGICAL BOWEL NUTRITION STATUS      Incontinent Diet(Dys 2, thin liquids)  AMBULATORY STATUS COMMUNICATION OF NEEDS Skin   Limited Assist Non-Verbally PU Stage and Appropriate Care, Surgical wounds   PU Stage 2 Dressing: Daily                   Personal Care Assistance Level of Assistance  Bathing, Feeding, Dressing, Total care Bathing Assistance: Maximum assistance Feeding assistance: Limited assistance Dressing Assistance: Maximum assistance Total Care Assistance: Maximum assistance   Functional Limitations Info  Speech     Speech Info: Impaired    SPECIAL CARE FACTORS FREQUENCY  PT (By licensed PT), OT (By licensed OT), Speech therapy     PT Frequency: 5x/wk OT Frequency: 5x/wk     Speech Therapy Frequency: 5x/wk      Contractures Contractures Info: Not present    Additional Factors Info  Code Status, Allergies, Psychotropic, Insulin Sliding Scale Code Status Info: Full Allergies Info: Levaquin Psychotropic Info: see MAR Insulin Sliding Scale Info: prn       Current Medications (12/07/2018):  This is the current hospital active medication list Current Facility-Administered Medications  Medication Dose Route Frequency Provider Last Rate Last Dose  . acetaminophen (TYLENOL) tablet 650 mg  650 mg Oral Q6H PRN Cathlyn Parsons, PA-C   650 mg at 12/04/18 2155  . albuterol (PROVENTIL) (2.5  MG/3ML) 0.083% nebulizer solution 2.5 mg  2.5 mg Nebulization Q4H PRN Cathlyn Parsons, PA-C   2.5 mg at 12/06/18 1826  . anastrozole (ARIMIDEX) tablet 1 mg  1 mg Oral Daily Kirsteins, Luanna Salk, MD   1 mg at 12/07/18 8786  . arformoterol (BROVANA) nebulizer solution 15 mcg  15 mcg Nebulization BID Cathlyn Parsons, PA-C   15 mcg at 12/07/18 7672  . bisacodyl (DULCOLAX) suppository 10 mg  10 mg Rectal Daily PRN Angiulli, Lavon Paganini, PA-C      . budesonide (PULMICORT) nebulizer solution 0.5 mg  0.5 mg Nebulization BID AngiulliLavon Paganini, PA-C   0.5 mg at 12/07/18 0854  . chlorhexidine (PERIDEX) 0.12 % solution 15 mL  15 mL Mouth Rinse BID Meredith Staggers, MD   15 mL at 12/07/18 0815  . clonazePAM (KLONOPIN) disintegrating tablet 0.5 mg  0.5 mg Per Tube TID PRN Cathlyn Parsons, PA-C   0.5 mg at 12/06/18 0912  . divalproex (DEPAKOTE ER) 24 hr tablet 250 mg  250 mg Oral TID Cathlyn Parsons, PA-C   250 mg at 12/07/18 0947  . enoxaparin (LOVENOX) injection 30 mg  30 mg Subcutaneous Q24H Kirsteins, Luanna Salk, MD   30 mg at 12/06/18 1348  . famotidine (PEPCID) tablet 20 mg  20 mg Oral BID Alger Simons T, MD      . FLUoxetine [facility]) capsule 40 mg  40 mg Per Tube Daily Cathlyn Parsons, PA-C   40 mg at 12/07/18 0815  . folic acid (FOLVITE) tablet 1 mg  1 mg Per Tube Daily Angiulli, Lavon Paganini, PA-C   1 mg at 12/07/18 0815  . insulin aspart (novoLOG) injection 0-9 Units  0-9 Units Subcutaneous TID AC & HS Angiulli, Lavon Paganini, PA-C      . ipratropium-albuterol (DUONEB) 0.5-2.5 (3) MG/3ML nebulizer solution 3 mL  3 mL Nebulization BID Meredith Staggers, MD   3 mL at 12/07/18 0854  . levothyroxine (SYNTHROID, LEVOTHROID) tablet 75 mcg  75 mcg Per Tube Q0600 Cathlyn Parsons, PA-C   75 mcg at 12/05/18 0645  . MEDLINE mouth rinse  15 mL Mouth Rinse q12n4p Meredith Staggers, MD   15 mL at 12/04/18 1827  . multivitamin with minerals tablet 1 tablet  1 tablet Oral Daily Meredith Staggers, MD   1 tablet at  12/07/18 0815  . polyethylene glycol (MIRALAX / GLYCOLAX) packet 17 g  17 g Oral Daily PRN Angiulli, Lavon Paganini, PA-C      . predniSONE (DELTASONE) tablet 10 mg  10 mg Per Tube Q breakfast Angiulli, Lavon Paganini, PA-C   10 mg at 12/07/18 0815  . [place]   Oral PRN Meredith Staggers, MD      . thiamine (VITAMIN B-1) tablet 100 mg  100 mg Per Tube Daily AngiulliLavon Paganini, PA-C   100 mg at 12/07/18 0962     Discharge Medications: Please see discharge summary for a list of discharge medications.  Relevant Imaging Results:  Relevant Lab Results:   Additional Information [id number].   Courtney Espino, LCSW

## 2018-12-07 NOTE — Progress Notes (Signed)
Speech Language Pathology Daily Session Note  Patient Details  Name: NATSUKO KELSAY MRN: [id number] Date of Birth: 1961/02/28  Today's Date: 12/07/2018 SLP Individual Time: 1300-1330 [phone] SLP Individual Time Calculation (min): 30 min and 60 min  Short Term Goals: Week 2: SLP Short Term Goal 1 (Week 2): Patient will consume current diet with minimal overt s/s of aspiration and Mod A verbal cues for use of swallowing compensatory srategies.  SLP Short Term Goal 2 (Week 2): Patient will consume trials of thin liquids without overt s/s of aspiration over 3 sessions prior to upgrade with Mod verbal cues.  SLP Short Term Goal 3 (Week 2): Patient will consume trials of Dys. 3 textures with efficient mastication and complete oral clearance over 2 sessions prior to upgrade.  SLP Short Term Goal 4 (Week 2): Patient will demonstrate sustained attention to tasks for 10 minutes with Mod A verbal cues for redirection.  SLP Short Term Goal 5 (Week 2): Patient will utilize multimodal communication (gestures, mouthing words, communication board) to express wants/needs with Mod A mulitmodal cues.  SLP Short Term Goal 6 (Week 2): Patient will tolerate PMSV for ~1 minute with all vitals remaining WFL without signs of distress with Max A multimodal cues.   Skilled Therapeutic Interventions: 1# Skilled ST services focused on speech and swallow skills. Pt missed 15 minutes of ST treatment time due to respiratory interventions, suctioning/cleaning of trach and breathing treatment. SLP facilitated PO consumption dys 3 snack trial, pt demonstrated appropriate oral clearances and no overt s/s aspiration. SLP will facilitatted dys trial tray in upcoming ST session for diet tolerance. SLP facilitated PMSV trial, pt tolerated 26 seconds and requested removals, O2 % decreased from 100 to 95 and HR increased from 87-91. Pt communicated wants/needs utilizing gestures and aphonic mouthing of words/phrases (placed meal order in  room, requesting brief to changed, repositioning.[name] Kitchenetc) with mod A verbal cues for clarifictaion. SLP and NT, [place], changed pt's brief. Pt was left in room with call bell within reach and bed alarm set. Recommend to continue skilled ST services.   2# Skilled ST services focused on swallow skills. SLP requested to reduced distractions by turning the TV off, pt and pt's friend [name] initially refused however agreed. SLP facilitated PO consumption of dys 3 trial lunch tray, however only 1 item cake presented as dys 3, pt demonstrated no oral residue, appropriate oral clearance, and no s/s aspiration with supervision A verbal cues for use of swallow strategies . SLP upgraded diet texture to dys 3 due to multiple skilled observation over 2+ sessions. Pt attempted to communicate at phrase level and required max A verbal cues to break down aphonic speech into words to increase effectiveness of communication.  Pt was left in room with call bell within reach, [name] present and bed alarm set. Recommend to continue skilled ST services.       Pain Pain Assessment Pain Score: 0-No pain  Therapy/Group: Individual Therapy  Gagandeep Pettet  [facility] 12/07/2018, 3:50 PM

## 2018-12-07 NOTE — Progress Notes (Signed)
Pt refused synthroid this morning. Nurse into offer medication x2. Patient education ineffective.

## 2018-12-07 NOTE — Progress Notes (Signed)
[place] PHYSICAL MEDICINE & REHABILITATION PROGRESS NOTE   Subjective/Complaints: Refused synthroid this morning, no complaints when I saw her. Rested well  ROS: Limited due to cognitive/behavioral   Objective:   No results found. No results for input(s): WBC, HGB, HCT, PLT in the last 72 hours. No results for input(s): NA, K, CL, CO2, GLUCOSE, BUN, CREATININE, CALCIUM in the last 72 hours.  Intake/Output Summary (Last 24 hours) at 12/07/2018 0857 Last data filed at 12/06/2018 1800 Gross per 24 hour  Intake 236 ml  Output -  Net 236 ml     Physical Exam: Vital Signs Blood pressure 106/68, pulse 78, temperature 98 F (36.7 C), resp. rate 17, height 5\' 10"  (1.778 m), weight 70.8 kg, SpO2 100 %. Constitutional: No distress . Vital signs reviewed. HEENT: EOMI, oral membranes moist Neck: supple, trach #6 with secretions present,  Cardiovascular: RRR without murmur. No JVD    Respiratory: CTA Bilaterally without wheezes or rales, a few rhonchi. Normal effort    GI: BS +, non-tender, non-distended  Extremities: No clubbing, cyanosis, or edema Skin: No evidence of breakdown, no evidence of rash Neurologic: Cranial nerves II through XII intact, motor strength is 4/5 in bilateral deltoid, bicep, tricep, grip, hip flexor, knee extensors, ankle dorsiflexor and plantar flexor, inchanged  Musculoskeletal: Full range of motion in all 4 extremities. No joint swelling    Assessment/Plan: 1. Functional deficits secondary to debilitiy from respiratory failure which require 3+ hours per day of interdisciplinary therapy in a comprehensive inpatient rehab setting.  Physiatrist is providing close team supervision and 24 hour management of active medical problems listed below.  Physiatrist and rehab team continue to assess barriers to discharge/monitor patient progress toward functional and medical goals  Care Tool:  Bathing  Bathing activity did not occur: Refused Body parts bathed by  patient: Right arm, Left arm, Chest, Abdomen, Right upper leg, Left upper leg, Face   Body parts bathed by helper: Front perineal area, Buttocks, Left lower leg, Right lower leg     Bathing assist Assist Level: Moderate Assistance - Patient 50 - 74%     Upper Body Dressing/Undressing Upper body dressing   What is the patient wearing?: Pull over shirt    Upper body assist Assist Level: Moderate Assistance - Patient 50 - 74%    Lower Body Dressing/Undressing Lower body dressing      What is the patient wearing?: Pants     Lower body assist Assist for lower body dressing: Maximal Assistance - Patient 25 - 49%     Toileting Toileting Toileting Activity did not occur Landscape architect and hygiene only): Refused  Toileting assist Assist for toileting: Maximal Assistance - Patient 25 - 49%     Transfers Chair/bed transfer  Transfers assist     Chair/bed transfer assist level: Moderate Assistance - Patient 50 - 74%     Locomotion Ambulation   Ambulation assist   Ambulation activity did not occur: Safety/medical concerns          Walk 10 feet activity   Assist  Walk 10 feet activity did not occur: Refused        Walk 50 feet activity   Assist Walk 50 feet with 2 turns activity did not occur: Safety/medical concerns         Walk 150 feet activity   Assist Walk 150 feet activity did not occur: Safety/medical concerns         Walk 10 feet on uneven surface  activity  Assist Walk 10 feet on uneven surfaces activity did not occur: Safety/medical concerns         Wheelchair     Assist   Type of Wheelchair: Manual Wheelchair activity did not occur: Refused         Wheelchair 50 feet with 2 turns activity    Assist    Wheelchair 50 feet with 2 turns activity did not occur: Refused       Wheelchair 150 feet activity     Assist Wheelchair 150 feet activity did not occur: Safety/medical concerns        Medical  Problem List and Plan: 1.Debility and metabolic encephalopathysecondary to acute on chronic respiratory failure status post tracheostomy 11/19/2018.Pulmonary services to follow-up on plan decannulation continue CIR PT, OT,SLP  2.  DVT prophyllaxis Subcutaneous heparin.no renal failure , switch to Lovenox 3. Pain Management:Tylenol as needed 4. Mood/bipolar disorder:Prozac 40 mg daily for mood stabilization, Klonopin 0.5 mg 3 times a day as needed for anxiety or agitation -monitoring sleep patterns -sleep chart 5. Neuropsych: This patientis notcapable of making decisions on herown behalf. 6. Skin/Wound Care:Routine skin checks 7. Fluids/Electrolytes/Nutrition:Routine in and out's PICC removed   8. Chronic hypotension. Patient had been on ProAmatine 10 mg 3 times a day as well as Florinef prior to admission.Resume as neededpendng tolerance of therapy Vitals:   12/07/18 0344 12/07/18 0854  BP: 106/68   Pulse: 66 78  Resp: 17   Temp: 98 F (36.7 C)   SpO2: 100% 100%  fair control at present 9.COPD with history of tobacco abuse. Patient on 2 L oxygen at home.Continue chronic prednisone. Follow-up pulmonary services Dr.Wert.Continue nebulizersas directed  - would like to wean trach currently #6 cuffed  - respiratory therapy was unable to remove  - pulmonary to follow-up today   2/27 CXR with no disease, per CCM 10. Seizure disorder. ContinueDepakoteas prior to admission.. EEG negative No clinical seizure activity 11.Hypothyroidism. Synthroid 12. Dysphagia. Dysphagia #2 nectar liquids. Follow-up speech therapy. Monitor hydration. 13. Polysubstance abuse. UDS positive for cocaine. Provide counseling 14. History of breast cancer with lumpectomy. Patient on Arimidex 1 mg daily prior to admission.OK to restart per Dr Burr Medico Heme/Onc also Dr Burr Medico wants pt to f/u since she hasn't been seen since 2018  15.  Leukocytosis with chronic steroid use,  afeb 16.  Medication compliance, patient has a history of this, had not followed up with heme-onc for about 2 years   -asked neuropsychology to evaluate LOS: 10 days A FACE TO FACE EVALUATION WAS PERFORMED  Meredith Staggers 12/07/2018, 8:57 AM

## 2018-12-07 NOTE — Procedures (Signed)
First Trach Change  Tubing placed in the trach, cuff deflated, old trach removed with some difficulty due to stoma size then size 6 cuffless shiley replaced without difficulty and verified by auscultation and color change.  Rush Farmer, M.D. [facility] Pulmonary/Critical Care Medicine. Pager: [phone]. After hours pager: [phone].

## 2018-12-07 NOTE — Progress Notes (Signed)
NAME:  Courtney Terry, MRN:  [id number], DOB:  Sep 03, 1961, LOS: 36 ADMISSION DATE:  11/27/2018, CONSULTATION DATE:  11/08/18 REFERRING MD:  Karmen Bongo, MD, CHIEF COMPLAINT:  Respiratory failure    Brief History   58 year old female with COPD (FEV1 25%) and chronic hypoxemic respiratory failure. Admitted 2/2-2/21 with acute on chronic hypoxemic and hypercarbic respiratory failure requiring intubation. UDS positive for cocaine. Tracheostomy placement on 2/13. 2/21 Admitted to [place].  PCCM asked to consult 2/27 for tracheostomy care.   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 HITT negative.  2/16 Sedate, gtt's being weaned off, on ATC during day but then required MV for resp distress 2/21 IP Rehab   Consults:  PCCM   Procedures:  ETT 2/2 >> 2/13 Trach 2/13 >> R PICC 2/13 > 2/26   Significant Diagnostic Tests:  CTA 2/2: Neg for PE, emphysema, multiple pulmonary nodules, ETT in place CT head w/o contrast 2/2: stable/normal EEG 2/3: moderate global encephalopathy,  No epileptiform activities  UDS 2/2 > cocaine  Micro Data:  Resp PCR 2/2 > negative  UC 2/2 > 100k corynebacterium  Sputum 2/2 > normal flora  BCx2 2/2 > negative  Cervical Swab 2/14 > trichomonas   Antimicrobials:  Azithromycin 2/2 > 2/6 Ceftriaxone 2/3 >2/7 Flagyl 2/14 >off  Interim history/subjective:  No events overnight, no new complaints RT was unable to remove the old tracheostomy  Objective   Blood pressure 106/68, pulse 88, temperature 98 F (36.7 C), resp. rate 20, height 5\' 10"  (1.778 m), weight 70.8 kg, SpO2 95 %.    FiO2 (%):  [35 %] 35 %   Intake/Output Summary (Last 24 hours) at 12/07/2018 1523 Last data filed at  12/07/2018 0900 Gross per 24 hour  Intake 120 ml  Output -  Net 120 ml   Filed Weights   12/04/18 0433 12/05/18 0556 12/07/18 0344  Weight: 43.9 kg 67.6 kg 70.8 kg   EXAM: General:  Elderly female, resting in bed HEENT: Lurline Idol is in a good position, [place]/AT, PERRL, EOM-I Neuro: Awake and interactive, following commands CV: RRR, Nl S1/S2 and -M/R/G PULM: CTA bilaterally GI: Soft, NT, ND and +BS Extremities: -edema and -tend Skin: intact   LABS   PULMONARY No results for input(s): PHART, PCO2ART, PO2ART, HCO3, TCO2, O2SAT in the last 168 hours.  Invalid input(s): PCO2, PO2  CBC No results for input(s): HGB, HCT, WBC, PLT in the last 168 hours.  COAGULATION No results for input(s): INR in the last 168 hours.  CARDIAC  No results for input(s): TROPONINI in the last 168 hours. No results for input(s): PROBNP in the last 168 hours.   CHEMISTRY No results for input(s): NA, K, CL, CO2, GLUCOSE, BUN, CREATININE, CALCIUM, MG, PHOS in the last 168 hours. Estimated Creatinine Clearance: 84.9 mL/min (A) (by C-G formula based on SCr of 0.33 mg/dL (L)).   LIVER No results for input(s): AST, ALT, ALKPHOS, BILITOT, PROT, ALBUMIN, INR in the last 168 hours.  INFECTIOUS No results for input(s): LATICACIDVEN, PROCALCITON in the last 168 hours.   ENDOCRINE CBG (last 3)  Recent Labs    12/06/18 2138 12/07/18 0617 12/07/18 1242  GLUCAP 93 87 123*   IMAGING   I reviewed CXR myself, trach  is in a good position  Resolved Hospital Problem list   Discussed with RT  Assessment & Plan:   Acute on Chronic Hypoxemic and Hypercarbic Respiratory Failure in setting of severe COPD (FEV1 25%), s/p tracheostomy  P:  - Supplemental O2   - Titrate O2 for sat of 88-92%  Trach status:  - Change from a cuffed 6 to a cuffless 6 today  - If tolerated will change to a cuffless 4 by the end of the week  Rush Farmer, M.D. [facility] Pulmonary/Critical Care Medicine. Pager: [phone]. After  hours pager: [phone].

## 2018-12-07 NOTE — Progress Notes (Signed)
Physical Therapy Weekly Progress Note  Patient Details  Name: Courtney Terry MRN: [id number] Date of Birth: August 08, 1961  Beginning of progress report period: November 28, 2018 End of progress report period: December 07, 2018  Today's Date: 12/07/2018 PT Individual Time: 4573-3448 PT Individual Time Calculation (min): 45 min   Patient has met 1 of 5 short term goals.  Pt's progress this week has been limited by self-limiting behaviors and resistance to participation in functional activity during therapy sessions. Pt has required max encouragement to spend any time out of bed and to participate in any standing. Pt has refused to perform any w/c mobility or gait up to this point. Pt is anywhere from [place] to dependent with mobility depending on willingness to participate.  Patient continues to demonstrate the following deficits muscle weakness, decreased cardiorespiratoy endurance and decreased oxygen support and decreased sitting balance, decreased standing balance, decreased postural control and decreased balance strategies and therefore will continue to benefit from skilled PT intervention to increase functional independence with mobility.  Patient not progressing toward long term goals.  See goal revision..  Plan of care revisions: Pt's goals downgraded to min A overall, d/c gait and stair goal due to self-limiting behavior and lack of progress..  PT Short Term Goals Week 1:  PT Short Term Goal 1 (Week 1): Pt will performed sit<>stand with mod assist  PT Short Term Goal 1 - Progress (Week 1): Not met PT Short Term Goal 2 (Week 1): Pt will perform bed mobility with supevision assist  PT Short Term Goal 2 - Progress (Week 1): Not met PT Short Term Goal 3 (Week 1): Pt will performed bed<>chair with stand pivot and mod assist   PT Short Term Goal 3 - Progress (Week 1): Met PT Short Term Goal 4 (Week 1): Pt will abmulate 27f with mod assist and LRAD  PT Short Term Goal 4 - Progress (Week 1): Not  met PT Short Term Goal 5 (Week 1): Pt will propell WC 565fwith min assist  PT Short Term Goal 5 - Progress (Week 1): Not met Week 2:  PT Short Term Goal 1 (Week 2): =LTG due to ELOS  Skilled Therapeutic Interventions/Progress Updates:  Pt received seated in bed, initially resistant to participate in therapy session but agreeable to sit EOB and change clothes. No complaints of pain at rest, does have buttock pain with mobility. Pt indicates she has been incontinent in her brief. Per pt report she cannot tell when she needs to use the bathroom. Rolling L/R with CGA and use of bedrails for dependent brief change and pericare. Pt is min A to don pants while supine in bed. Attempt to assist pt to sit EOB, pt resistant and refuses to sit EOB. Pt does report she wants to change her shirt, agreeable to sit up in bed. Supine to long-sitting in bed with min A. Pt is min A to change shirt. Sit to supine min A. Pt left seated in bed with needs in reach, bed alarm in place.  Therapy Documentation Precautions:  Precautions Precautions: Fall Precaution Comments: Trach collar, supplemental 02 Restrictions Weight Bearing Restrictions: No   Therapy/Group: Individual Therapy   TaExcell SeltzerPT, DPT  12/07/2018, 12:55 PM

## 2018-12-07 NOTE — Progress Notes (Signed)
Patient refused depakote 1400 dose. Educated about need to maintain anti-seizure medication routine. Refused second attempt to administer med.

## 2018-12-08 ENCOUNTER — Encounter (HOSPITAL_COMMUNITY): Payer: Medicaid Other | Admitting: Psychology

## 2018-12-08 ENCOUNTER — Inpatient Hospital Stay (HOSPITAL_COMMUNITY): Payer: Medicaid Other | Admitting: Occupational Therapy

## 2018-12-08 ENCOUNTER — Inpatient Hospital Stay (HOSPITAL_COMMUNITY): Payer: Medicaid Other | Admitting: Physical Therapy

## 2018-12-08 ENCOUNTER — Inpatient Hospital Stay (HOSPITAL_COMMUNITY): Payer: Medicaid Other

## 2018-12-08 DIAGNOSIS — F3170 Bipolar disorder, currently in remission, most recent episode unspecified: Secondary | ICD-10-CM

## 2018-12-08 DIAGNOSIS — R0902 Hypoxemia: Secondary | ICD-10-CM

## 2018-12-08 LAB — GLUCOSE, CAPILLARY
GLUCOSE-CAPILLARY: 114 mg/dL — AB (ref 70–99)
Glucose-Capillary: 80 mg/dL (ref 70–99)
Glucose-Capillary: 95 mg/dL (ref 70–99)
Glucose-Capillary: 115 mg/dL — ABNORMAL HIGH (ref 70–99)

## 2018-12-08 MED ORDER — FLUOXETINE HCL 20 MG PO CAPS
40.0000 mg | ORAL_CAPSULE | Freq: Every day | ORAL | Status: DC
  Administered 2018-12-09 – 2018-12-16 (×3): 40 mg via ORAL
  Filled 2018-12-08 (×8): qty 2

## 2018-12-08 MED ORDER — LEVOTHYROXINE SODIUM 75 MCG PO TABS
75.0000 ug | ORAL_TABLET | Freq: Every day | ORAL | Status: DC
  Administered 2018-12-09 – 2018-12-24 (×12): 75 ug via ORAL
  Filled 2018-12-08 (×16): qty 1

## 2018-12-08 MED ORDER — FOLIC ACID 1 MG PO TABS
1.0000 mg | ORAL_TABLET | Freq: Every day | ORAL | Status: DC
  Administered 2018-12-09 – 2018-12-14 (×5): 1 mg via ORAL
  Filled 2018-12-08 (×9): qty 1

## 2018-12-08 MED ORDER — VITAMIN B-1 100 MG PO TABS
100.0000 mg | ORAL_TABLET | Freq: Every day | ORAL | Status: DC
  Administered 2018-12-09 – 2018-12-14 (×5): 100 mg via ORAL
  Filled 2018-12-08 (×9): qty 1

## 2018-12-08 MED ORDER — PREDNISONE 5 MG PO TABS
10.0000 mg | ORAL_TABLET | Freq: Every day | ORAL | Status: DC
  Administered 2018-12-09 – 2018-12-24 (×12): 10 mg via ORAL
  Filled 2018-12-08 (×13): qty 2

## 2018-12-08 NOTE — Plan of Care (Signed)
  Problem: Consults Goal: RH GENERAL PATIENT EDUCATION Description See Patient Education module for education specifics. Outcome: Progressing   Problem: RH BOWEL ELIMINATION Goal: RH STG MANAGE BOWEL WITH ASSISTANCE Description STG Manage Bowel with min Assistance.  Outcome: Progressing   Problem: RH SKIN INTEGRITY Goal: RH STG SKIN FREE OF INFECTION/BREAKDOWN Description With min assist  Outcome: Progressing Goal: RH STG MAINTAIN SKIN INTEGRITY WITH ASSISTANCE Description STG Maintain Skin Integrity With min Assistance.  Outcome: Progressing   Problem: RH SAFETY Goal: RH STG ADHERE TO SAFETY PRECAUTIONS W/ASSISTANCE/DEVICE Description STG Adhere to Safety Precautions With cues/reminders Assistance/Device.  Outcome: Progressing   Problem: RH KNOWLEDGE DEFICIT GENERAL Goal: RH STG INCREASE KNOWLEDGE OF SELF CARE AFTER HOSPITALIZATION Description Family will be able to explain pt care needs and demonstrate ability to perform using handouts and resources independently  Outcome: Progressing

## 2018-12-08 NOTE — Progress Notes (Signed)
NAME:  Courtney Terry, MRN:  [id number], DOB:  12-30-1960, LOS: 81 ADMISSION DATE:  11/27/2018, CONSULTATION DATE:  11/08/18 REFERRING MD:  Karmen Bongo, MD, CHIEF COMPLAINT:  Respiratory failure    Brief History   58 year old female with COPD (FEV1 25%) and chronic hypoxemic respiratory failure. Admitted 2/2-2/21 with acute on chronic hypoxemic and hypercarbic respiratory failure requiring intubation. UDS positive for cocaine. Tracheostomy placement on 2/13. 2/21 Admitted to [place].  PCCM asked to consult 2/27 for tracheostomy care.   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 HITT negative.  2/16 Sedate, gtt's being weaned off, on ATC during day but then required MV for resp distress 2/21 IP Rehab   Consults:  PCCM   Procedures:  ETT 2/2 >> 2/13 Trach 2/13 >> R PICC 2/13 > 2/26   Significant Diagnostic Tests:  CTA 2/2: Neg for PE, emphysema, multiple pulmonary nodules, ETT in place CT head w/o contrast 2/2: stable/normal EEG 2/3: moderate global encephalopathy,  No epileptiform activities  UDS 2/2 > cocaine  Micro Data:  Resp PCR 2/2 > negative  UC 2/2 > 100k corynebacterium  Sputum 2/2 > normal flora  BCx2 2/2 > negative  Cervical Swab 2/14 > trichomonas   Antimicrobials:  Azithromycin 2/2 > 2/6 Ceftriaxone 2/3 >2/7 Flagyl 2/14 >off  Interim history/subjective:  No events overnight, tolerated trach change without difficulty   Objective   Blood pressure 99/68, pulse 90, temperature 99.2 F (37.3 C), temperature source Oral, resp. rate 20, height 5\' 10"  (1.778 m), weight 71.2 kg, SpO2 96 %.    FiO2 (%):  [35 %] 35 %   Intake/Output Summary (Last 24 hours) at 12/08/2018 1542 Last data filed  at 12/08/2018 1534 Gross per 24 hour  Intake 600 ml  Output -  Net 600 ml   Filed Weights   12/05/18 0556 12/07/18 0344 12/08/18 0651  Weight: 67.6 kg 70.8 kg 71.2 kg   EXAM: General:  Elderly female HEENT: Trach size 6 cuffless in place Neuro: Awake and interactive, moving all ext to command CV: RRR, Nl S1/S2 and -M/R/G PULM: Coarse BS diffusely GI: Soft, NT, ND and +BS Extremities: -edema and -tend Skin: intact   I reviewed CXR myself, trach is in a good position  LABS   PULMONARY No results for input(s): PHART, PCO2ART, PO2ART, HCO3, TCO2, O2SAT in the last 168 hours.  Invalid input(s): PCO2, PO2  CBC No results for input(s): HGB, HCT, WBC, PLT in the last 168 hours.  COAGULATION No results for input(s): INR in the last 168 hours.  CARDIAC  No results for input(s): TROPONINI in the last 168 hours. No results for input(s): PROBNP in the last 168 hours.   CHEMISTRY No results for input(s): NA, K, CL, CO2, GLUCOSE, BUN, CREATININE, CALCIUM, MG, PHOS in the last 168 hours. Estimated Creatinine Clearance: 84.9 mL/min (A) (by C-G formula based on SCr of 0.33 mg/dL (L)).   LIVER No results for input(s): AST, ALT, ALKPHOS, BILITOT, PROT, ALBUMIN, INR in the last 168 hours.  INFECTIOUS No results for input(s): LATICACIDVEN, PROCALCITON in the last 168 hours.   ENDOCRINE CBG (last 3)  Recent Labs    12/07/18 2120 12/08/18 0609 12/08/18 1219  GLUCAP 96 80 95   IMAGING  I reviewed CXR myself, trach is in a good position  Resolved Hospital Problem list   Discussed with PCCM-NP  Assessment & Plan:   Acute on Chronic Hypoxemic and Hypercarbic Respiratory Failure in setting of severe COPD (FEV1 25%), s/p tracheostomy  P:  - Supplemental O2   - Titrate O2 for sat of 88-92%  Trach status:  - Maintain with a 6 cuffless shiley til Friday  - If tolerated then change to a cuffless 4 on Friday  - Anticipate decannulation on Monday  PCCM will continue to  follow  Rush Farmer, M.D. [name] Pulmonary/Critical Care Medicine. Pager: [phone]. After hours pager: [phone].
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
## 2018-12-08 NOTE — Progress Notes (Signed)
Occupational Therapy Session Note  Patient Details  Name: Courtney Terry MRN: [id number] Date of Birth: Aug 31, 1961  Today's Date: 12/08/2018 OT Individual Time: 1015-1110 OT Individual Time Calculation (min): 55 min    Short Term Goals: Week 2:  OT Short Term Goal 1 (Week 2): STGs=LTGs due to ELOS  Skilled Therapeutic Interventions/Progress Updates:    Patient in bed upon arrival.  States that she does not want to participate but with encouragement completes LB dressing ( max A)  Supine to SSP with CG A - she has pain at her buttocks with transitional movements.  Tolerates SSP at edge of bed with good seated balance x2 - trail one for 15 minutes with completion of nail filing.  Trial 2 for 20 minutes with coloring task and eating of magic cup.  Tremor of bilateral hands limits dexterity.  She returned to side lying position, bed alarm set and call bell in reach   Therapy Documentation Precautions:  Precautions Precautions: Fall Precaution Comments: Trach collar, supplemental 02 Restrictions Weight Bearing Restrictions: No General:   Vital Signs: Therapy Vitals Pulse Rate: 87 Resp: 18 Patient Position (if appropriate): Lying Oxygen Therapy SpO2: 96 % O2 Device: Tracheostomy Collar O2 Flow Rate (L/min): 8 L/min FiO2 (%): 35 % Pain: Pain Assessment Pain Scale: 0-10 Pain Score: 4  Faces Pain Scale: No hurt Pain Location: Buttocks Pain Orientation: Mid Pain Descriptors / Indicators: Shooting Pain Intervention(s): Repositioned   Other Treatments:     Therapy/Group: Individual Therapy  Carlos Levering 12/08/2018, 12:17 PM

## 2018-12-08 NOTE — Progress Notes (Signed)
Physical Therapy Session Note  Patient Details  Name: Courtney Terry MRN: [id number] Date of Birth: 11-01-1960  Today's Date: 12/08/2018 PT Individual Time: 1300-1330 PT Individual Time Calculation (min): 30 min   Short Term Goals: Week 2:  PT Short Term Goal 1 (Week 2): =LTG due to ELOS  Skilled Therapeutic Interventions/Progress Updates:    Pt received supine in bed, encouraged pt to participate in therapy session. Initially pt very resistant and becomes agitated and very expressive with her BUE. Pt's lunch tray arrives and then pt is agreeable to sit up in bed to eat lunch. Pt is CGA for supine to sitting in bed. Pt is able to feed herself lunch with use of alt UE with setup A and cueing for small bites/sips and to completely swallow food before next bite. Pt refuses any further participation once she has eaten her lunch. Education with patient about importance of getting out of bed and importance of continuing to stand during therapy sessions if she wants to be able to ambulate again. Also provided education that mobility will prevent further skin breakdown. Pt remains resistant and agitated and refuses to get out of bed to participate in any further functional activity. Pt unable to state her goals for rehab or her reasons for not wanting to get out of bed other than "I don't feel like it". Pt left supine in bed with needs in reach, bed alarm in place, friend [name] present. Pt missed 30 min of scheduled skilled therapy services due to refusal to participate. Vitals WNL during session.  Therapy Documentation Precautions:  Precautions Precautions: Fall Precaution Comments: Trach collar, supplemental 02 Restrictions Weight Bearing Restrictions: No General: PT Amount of Missed Time (min): 30 Minutes PT Missed Treatment Reason: Patient unwilling to participate    Therapy/Group: Individual Therapy   Excell Seltzer, PT, DPT  12/08/2018, 1:34 PM

## 2018-12-08 NOTE — Progress Notes (Signed)
Speech Language Pathology Daily Session Note  Patient Details  Name: Courtney Terry MRN: [id number] Date of Birth: Sep 27, 1961  Today's Date: 12/08/2018 SLP Individual Time: 0802-0859 SLP Individual Time Calculation (min): 57 min  Short Term Goals: Week 2: SLP Short Term Goal 1 (Week 2): Patient will consume current diet with minimal overt s/s of aspiration and Mod A verbal cues for use of swallowing compensatory srategies.  SLP Short Term Goal 2 (Week 2): Patient will consume trials of thin liquids without overt s/s of aspiration over 3 sessions prior to upgrade with Mod verbal cues.  SLP Short Term Goal 3 (Week 2): Patient will consume trials of Dys. 3 textures with efficient mastication and complete oral clearance over 2 sessions prior to upgrade.  SLP Short Term Goal 4 (Week 2): Patient will demonstrate sustained attention to tasks for 10 minutes with Mod A verbal cues for redirection.  SLP Short Term Goal 5 (Week 2): Patient will utilize multimodal communication (gestures, mouthing words, communication board) to express wants/needs with Mod A mulitmodal cues.  SLP Short Term Goal 6 (Week 2): Patient will tolerate PMSV for ~1 minute with all vitals remaining WFL without signs of distress with Max A multimodal cues.   Skilled Therapeutic Interventions:Skilled ST services focused on swallow and speech skills. SLP facilitated PO consumption of dys 3 and NTL via cup (breakfast tray however items primarily appeared dys 2),dys 3 and regular textured snack ( graham cracker and without peanut butter)  pt demonstrated appropriate oral clearance, x4 coughs (x2 breakfast tray and x2 trials) producing mucus through trach hub (suction was preformed) and min A with self feeding mainly due to tremors in hands. Pt demonstrated sustained attention during functional self-feeding task for 10 minutes without cues. Pt expressed pain, facial grimace and rubbing bottom, aphonic mouthing request to lower bed. Pt  demonstrates poor tolerance in 90 degree positioning due to sore on bottom. SLP facilitated PMSV tolerance with repositioning to 60 degrees and providing mirror to incraese self-awareness and encouragement, pt tolerated PMSV for 3 minutes with max A verbal encouragement to reduce anxeity and pt demonstrated ability to phonate on occasion while stating the alphabet, and answering basic questions. Pt's vitals remained stable, O2 94-95% and HR 82-84. Pt's friend [name] was present during session and removed a bag of chips from his bag. SLP provided education of rules, referring to visual aid and informed nurse. [name] removed bag of chips from room. Pt expressed joy and appear encouraged by the increase in tolerance. Pt was left in room with call bell within reach and bed alarm set. SLP reccomends to continue skilled services.     Pain Pain Assessment Pain Scale: 0-10 Pain Score: 5  Faces Pain Scale: No hurt Pain Type: Acute pain Pain Location: Buttocks Pain Orientation: Mid Pain Descriptors / Indicators: Shooting Pain Onset: Sudden Pain Intervention(s): Repositioned;Emotional support  Therapy/Group: Individual Therapy  Sheldon Amara  [facility] 12/08/2018, 12:20 PM

## 2018-12-08 NOTE — Progress Notes (Signed)
[place] PHYSICAL MEDICINE & REHABILITATION PROGRESS NOTE   Subjective/Complaints: No new issues. Pt continues to refuse meds routinely.   ROS: Limited due to cognitive/behavioral    Objective:   No results found. No results for input(s): WBC, HGB, HCT, PLT in the last 72 hours. No results for input(s): NA, K, CL, CO2, GLUCOSE, BUN, CREATININE, CALCIUM in the last 72 hours.  Intake/Output Summary (Last 24 hours) at 12/08/2018 0835 Last data filed at 12/07/2018 1800 Gross per 24 hour  Intake 440 ml  Output -  Net 440 ml     Physical Exam: Vital Signs Blood pressure (!) 100/48, pulse 90, temperature 97.9 F (36.6 C), resp. rate 18, height 5\' 10"  (1.778 m), weight 71.2 kg, SpO2 96 %. Constitutional: No distress . Vital signs reviewed. HEENT: EOMI, oral membranes moist Neck: supple, #6 shiley in place Cardiovascular: RRR without murmur. No JVD    Respiratory: CTA Bilaterally without wheezes or rales. Normal effort    GI: BS +, non-tender, non-distended  Extremities: No clubbing, cyanosis, or edema Skin: No evidence of breakdown, no evidence of rash Neurologic: Cranial nerves II through XII intact, motor strength is 4/5 in bilateral deltoid, bicep, tricep, grip, hip flexor, knee extensors, ankle dorsiflexor and plantar flexor===no change. Follows simple commands, language seems intact. Comprehension fair   Musculoskeletal: Full range of motion in all 4 extremities. No joint swelling    Assessment/Plan: 1. Functional deficits secondary to debilitiy from respiratory failure which require 3+ hours per day of interdisciplinary therapy in a comprehensive inpatient rehab setting.  Physiatrist is providing close team supervision and 24 hour management of active medical problems listed below.  Physiatrist and rehab team continue to assess barriers to discharge/monitor patient progress toward functional and medical goals  Care Tool:  Bathing  Bathing activity did not occur:  Refused Body parts bathed by patient: Right arm, Left arm, Chest, Abdomen, Right upper leg, Left upper leg, Face   Body parts bathed by helper: Front perineal area, Buttocks, Left lower leg, Right lower leg     Bathing assist Assist Level: Moderate Assistance - Patient 50 - 74%     Upper Body Dressing/Undressing Upper body dressing   What is the patient wearing?: Pull over shirt    Upper body assist Assist Level: Moderate Assistance - Patient 50 - 74%    Lower Body Dressing/Undressing Lower body dressing      What is the patient wearing?: Pants     Lower body assist Assist for lower body dressing: Maximal Assistance - Patient 25 - 49%     Toileting Toileting Toileting Activity did not occur Landscape architect and hygiene only): Refused  Toileting assist Assist for toileting: Maximal Assistance - Patient 25 - 49%     Transfers Chair/bed transfer  Transfers assist     Chair/bed transfer assist level: Moderate Assistance - Patient 50 - 74%     Locomotion Ambulation   Ambulation assist   Ambulation activity did not occur: Safety/medical concerns          Walk 10 feet activity   Assist  Walk 10 feet activity did not occur: Refused        Walk 50 feet activity   Assist Walk 50 feet with 2 turns activity did not occur: Safety/medical concerns         Walk 150 feet activity   Assist Walk 150 feet activity did not occur: Safety/medical concerns         Walk 10 feet on uneven  surface  activity   Assist Walk 10 feet on uneven surfaces activity did not occur: Safety/medical concerns         Wheelchair     Assist   Type of Wheelchair: Manual Wheelchair activity did not occur: Refused         Wheelchair 50 feet with 2 turns activity    Assist    Wheelchair 50 feet with 2 turns activity did not occur: Refused       Wheelchair 150 feet activity     Assist Wheelchair 150 feet activity did not occur: Safety/medical  concerns        Medical Problem List and Plan: 1.Debility and metabolic encephalopathysecondary to acute on chronic respiratory failure status post tracheostomy 11/19/2018.Pulmonary services to follow-up on plan decannulation continue CIR PT, OT,SLP   -team conf 2.  DVT prophyllaxis Subcutaneous heparin.no renal failure , switch to Lovenox 3. Pain Management:Tylenol as needed 4. Mood/bipolar disorder:Prozac 40 mg daily for mood stabilization, Klonopin 0.5 mg 3 times a day as needed for anxiety or agitation -monitoring sleep patterns -sleep chart 5. Neuropsych: This patientis notcapable of making decisions on herown behalf. 6. Skin/Wound Care:Routine skin checks 7. Fluids/Electrolytes/Nutrition:Routine in and out's PICC removed   8. Chronic hypotension. Patient had been on ProAmatine 10 mg 3 times a day as well as Florinef prior to admission.Resume as neededpendng tolerance of therapy Vitals:   12/08/18 0812 12/08/18 0815  BP:    Pulse: 90   Resp: 18   Temp:    SpO2: 96% 96%  fair control at present 9.COPD with history of tobacco abuse. Patient on 2 L oxygen at home.Continue chronic prednisone. Follow-up pulmonary services Dr.Wert.Continue nebulizersas directed  -trach changed to #6 cuffelss by CCM 3/2   2/27 CXR with no disease, per CCM 10. Seizure disorder. ContinueDepakoteas prior to admission.. EEG negative No clinical seizure activity 11.Hypothyroidism. Synthroid 12. Dysphagia. Dysphagia #2 nectar liquids. Follow-up speech therapy. Monitor hydration. 13. Polysubstance abuse. UDS positive for cocaine. Provide counseling 14. History of breast cancer with lumpectomy. Patient on Arimidex 1 mg daily prior to admission.OK to restart per Dr Burr Medico Heme/Onc also Dr Burr Medico wants pt to f/u since she hasn't been seen since 2018  15.  Leukocytosis with chronic steroid use, afeb 16.  Medication compliance, patient has a history of this, had  not followed up with heme-onc for about 2 years  -discussed with patient and S.O. need for her to take medications/comply with medical plan. However her insight is poor   -asked neuropsychology to evaluate LOS: 11 days A FACE TO [place] 12/08/2018, 8:35 AM

## 2018-12-08 NOTE — Progress Notes (Signed)
Nutrition Follow-up  DOCUMENTATION CODES:   Severe malnutrition in context of chronic illness, Underweight  INTERVENTION:  Continue providing assistance with meals. Continue Vital Cuisine Shake TID with meals. Continue MVI daily.  NUTRITION DIAGNOSIS:   Severe Malnutrition related to chronic illness(COPD, hx breast cancer) as evidenced by severe fat depletion, severe muscle depletion, percent weight loss(25% weight loss in 10 months).  Ongoing  GOAL:   Patient will meet greater than or equal to 90% of their needs  Progressing  MONITOR:   PO intake, Supplement acceptance, Diet advancement, Skin, Weight trends  ASSESSMENT:   58 year old female with PMH significant for bipolar disorder, seizure disorder, breast cancer with lumpectomy, chronic hypotension, polysubstance abuse, COPD, tobacco abuse. Pt with recent hospitalizations for COPD exacerbation for acute on chronic hypoxemic and hypercarbic respiratory failure. Pt presented 11/08/18 for increasing SOB and decreased level of alertness requiring intubation. Urine drug screen positive for cocaine. Pt remained intubated through 11/19/18 with tracheostomy completed on 02/13. Pt required Cortrak tube for nutrition support but diet advanced to a dysphagia 2 with nectar-thick liquid and Cortrak removed prior to admission to CIR  Per CCM notes, pt had first trach change on 3/2. She has also continued to refuse medications.   Spoke with family member. Pt was laying in bed, would not add to conversation.  Pt is tolerating Hormel Shake but will sip on it more often than drink the whole thing. Per chart PO intake has been around 50-100%, pt family member endorses good appetite. Spoke with NT who reported that she does not eat 100% of meals, will often eat bites.  Encouraged continued good PO intake and to drink the Hormel Shake often. No complaints of nausea, vomiting, diarrhea, or constipation. Pt had no questions or concerns.    Medications reviewed and include: Depakote, pepcid, folic acid 1mg , insulin aspart 0-9 units 3x daily, MVI, thiamine 100mg , prednisone Labs reviewed: CBG ([phone]),     Diet Order:   Diet Order            DIET DYS 3 Room service appropriate? Yes; Fluid consistency: Nectar Thick  Diet effective 1000              EDUCATION NEEDS:   Not appropriate for education at this time  Skin:  Skin Assessment: Skin Integrity Issues: Skin Integrity Issues:: Stage II, Stage III Stage II: mid perineum Stage III: mid perineum  Last BM:  3/3  Height:   Ht Readings from Last 1 Encounters:  12/04/18 5\' 10"  (1.778 m)    Weight:   Wt Readings from Last 1 Encounters:  12/08/18 71.2 kg    Ideal Body Weight:  68.2 kg  BMI:  Body mass index is 22.52 kg/m.  Estimated Nutritional Needs:   Kcal:  1650-1850  Protein:  85-100 grams  Fluid:  >/= 1.7 [place] [facility] Intern

## 2018-12-08 NOTE — Progress Notes (Signed)
Occupational Therapy Note  Patient Details  Name: KAYTELYNN SCRIPTER MRN: [id number] Date of Birth: Jul 02, 1961  Today's Date: 12/08/2018 OT Missed Time: 67 Minutes Missed Time Reason: Patient fatigue;Patient unwilling/refused to participate without medical reason  Missed 30 min due to refused activity wanting to sleep.    Willeen Cass [facility] 12/08/2018, 2:53 PM

## 2018-12-08 NOTE — Progress Notes (Signed)
RT NOTE: RN just completed trach care upon RT arrival to room. Vitals are stable. RT will continue to monitor.

## 2018-12-09 ENCOUNTER — Inpatient Hospital Stay (HOSPITAL_COMMUNITY): Payer: Medicaid Other | Admitting: Occupational Therapy

## 2018-12-09 ENCOUNTER — Inpatient Hospital Stay (HOSPITAL_COMMUNITY): Payer: Medicaid Other | Admitting: Physical Therapy

## 2018-12-09 ENCOUNTER — Inpatient Hospital Stay (HOSPITAL_COMMUNITY): Payer: Medicaid Other | Admitting: Speech Pathology

## 2018-12-09 LAB — GLUCOSE, CAPILLARY
GLUCOSE-CAPILLARY: 116 mg/dL — AB (ref 70–99)
Glucose-Capillary: 89 mg/dL (ref 70–99)
Glucose-Capillary: 96 mg/dL (ref 70–99)
Glucose-Capillary: 107 mg/dL — ABNORMAL HIGH (ref 70–99)

## 2018-12-09 NOTE — Progress Notes (Addendum)
NAME:  Courtney Terry, MRN:  [id number], DOB:  06-Sep-1961, LOS: 12 ADMISSION DATE:  11/27/2018, CONSULTATION DATE:  11/08/18 REFERRING MD:  Karmen Bongo, MD, CHIEF COMPLAINT:  Respiratory failure    Brief History   58 year old female with COPD (FEV1 25%) and chronic hypoxemic respiratory failure. Admitted 2/2-2/21 with acute on chronic hypoxemic and hypercarbic respiratory failure requiring intubation. UDS positive for cocaine. Tracheostomy placement on 2/13. 2/21 Admitted to [facility].  PCCM asked to consult 2/27 for tracheostomy care.   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 HITT negative.  2/16 Sedate, gtt's being weaned off, on ATC during day but then required MV for resp distress 2/21 IP Rehab   Consults:  PCCM   Procedures:  ETT 2/2 >> 2/13 Trach 2/13 >> R PICC 2/13 > 2/26   Significant Diagnostic Tests:  CTA 2/2: Neg for PE, emphysema, multiple pulmonary nodules, ETT in place CT head w/o contrast 2/2: stable/normal EEG 2/3: moderate global encephalopathy,  No epileptiform activities  UDS 2/2 > cocaine  Micro Data:  Resp PCR 2/2 > negative  UC 2/2 > 100k corynebacterium  Sputum 2/2 > normal flora  BCx2 2/2 > negative  Cervical Swab 2/14 > trichomonas   Antimicrobials:  Azithromycin 2/2 > 2/6 Ceftriaxone 2/3 >2/7 Flagyl 2/14 >off  Interim history/subjective:  No distress today.  Resting comfortably.  Cough is strong.  Objective   Blood pressure 105/66, pulse 84, temperature 97.9 F (36.6 C), resp. rate 18, height 5\' 10"  (1.778 m), weight 70.9 kg, SpO2 94 %.    FiO2 (%):  [35 %] 35 %   Intake/Output Summary (Last 24 hours) at 12/09/2018 1104 Last data filed at 12/09/2018 0840 Gross per 24  hour  Intake 530 ml  Output no documentation  Net 530 ml   Filed Weights   12/07/18 0344 12/08/18 0651 12/09/18 0500  Weight: 70.8 kg 71.2 kg 70.9 kg   EXAM:  General: This is a 58 year old frail female resting comfortably in bed she is an excellent cough and is in no acute distress HEENT normocephalic atraumatic she has a size 6 tracheostomy in place she is able to phonate well with tracheal occlusion Pulmonary: Some scattered rhonchi that improved with cough no accessory use Cardiac: Regular rate and rhythm Abdomen: Soft, not tender, no organomegaly Extremities: Warm and dry brisk capillary refill Neuro: Awake and oriented no focal deficits  Resolved Hospital Problem list     Assessment & Plan:   Acute on Chronic Hypoxemic and Hypercarbic Respiratory Failure in setting of severe COPD (FEV1 25%), s/p tracheostomy sedated Trach status  Discussion She looks excellent with size 6 tracheostomy.  She has a very strong cough and is able to expectorate sputum when trach is occluded.  She is ready to downsize to size 4.  Plan Change tracheostomy to size 4 cuffless today, I would like to give her as much time with a smaller diameter tracheostomy as possible, that will improve cough mechanics following decannulation If no issues throughout the day on 3/5 with Passy-Muir valve we will initiate occlusion trials over the weekend with hope to decannulate on Monday  We will see her again Friday  Peter E Babcock ACNP-BC [place] Pager # [phone] OR # [phone]  if no answer  Attending Note:  58 year old female with tracheostomy due to mixed respiratory failure from COPD.  On exam, lungs are clear bilaterally.  I reviewed CXR myself trach is in a good position.  Discussed with PCCM-NP.  Hypoxemia:  - Titrate O2 for sat of 88-92%  Respiratory failure:  - Monitor for airway protection  Trach status:  - Change to a cuffless 4 today  - Cap on Friday  - If capped  through the weekend without complications will decannulate on Monday.  Patient seen and examined, agree with above note.  I dictated the care and orders written for this patient under my direction.  Rush Farmer, [place]

## 2018-12-09 NOTE — Progress Notes (Signed)
Physical Therapy Session Note  Patient Details  Name: Courtney Terry MRN: [id number] Date of Birth: 1961/02/19  Today's Date: 12/09/2018 PT Individual Time: 0800-0835 PT Individual Time Calculation (min): 35 min   Short Term Goals: Week 2:  PT Short Term Goal 1 (Week 2): =LTG due to ELOS  Skilled Therapeutic Interventions/Progress Updates:    Pt received sidelying in bed, initially resistant to participation in therapy. No complaints of pain. Pt then reports she has been incontinent and needs to be changed. Supervision and cueing for rolling L/R with use of bedrails for dependent brief change and pericare. Pt is setup A to pull pants up over bottom. Pt then reports she has not eaten breakfast yet. Max encouragement to come from supine to sitting EOB to eat lunch, Supervision. Pt is setup A with cueing for small bites and use of built up spoon to feed herself breakfast. Sit to supine Supervision. Pt left sidelying in bed with needs in reach, bed alarm in place at end of session.  Therapy Documentation Precautions:  Precautions Precautions: Fall Precaution Comments: Trach collar, supplemental 02 Restrictions Weight Bearing Restrictions: No    Therapy/Group: Individual Therapy  Excell Seltzer, PT, DPT  12/09/2018, 9:26 AM

## 2018-12-09 NOTE — Progress Notes (Signed)
Speech Language Pathology Daily Session Note  Patient Details  Name: Courtney MONGEAU MRN: [id number] Date of Birth: [birth year]  Today's Date: 12/09/2018 SLP Individual Time: 1445-1501 SLP Individual Time Calculation (min): 16 min  Short Term Goals: Week 2: SLP Short Term Goal 1 (Week 2): Patient will consume current diet with minimal overt s/s of aspiration and Mod A verbal cues for use of swallowing compensatory srategies.  SLP Short Term Goal 2 (Week 2): Patient will consume trials of thin liquids without overt s/s of aspiration over 3 sessions prior to upgrade with Mod verbal cues.  SLP Short Term Goal 3 (Week 2): Patient will consume trials of Dys. 3 textures with efficient mastication and complete oral clearance over 2 sessions prior to upgrade.  SLP Short Term Goal 4 (Week 2): Patient will demonstrate sustained attention to tasks for 10 minutes with Mod A verbal cues for redirection.  SLP Short Term Goal 5 (Week 2): Patient will utilize multimodal communication (gestures, mouthing words, communication board) to express wants/needs with Mod A mulitmodal cues.  SLP Short Term Goal 6 (Week 2): Patient will tolerate PMSV for ~1 minute with all vitals remaining WFL without signs of distress with Max A multimodal cues.   Skilled Therapeutic Interventions:  Skilled treatment session focused on toleration of PMV, communication and dysphagia. SLP was passing by pt's room and saw pt was frustrated with her sister and [name], [place] present. SLP entered room, pt recognized SLP, SLP placed PMV on trach hub to aid in verbal communication. Pt able to express general frustration with pain, fatigue and desire to go home. Pt reasonable.  Pt able to phonate sentence length utterances with > 90% intelligibility. No evidence of respiratory distress or air trapping. PMV removed for pt to finish consuming nectar thick liquids. Pt with desire to speak again, PMV placed and pt continued to verbally joke with SLP. Pt with  strong vocal intensity.   Spoke with pulmonary Laurey Arrow) regarding POC for decannulation and swallow function. Pete requested MBS at earliest available time prior to attempts to progress towards decannulation. Also this Probation officer provided education to [place] that currently PMV is only being worn with speech therapy. Collaborative plan made for increasing PMV wear time as tolerated by pt. Chart review indicates that pt tolerated PMV for ~ 30 minutes this morning with ST and again for ~ 20 minutes this afternoon after being downsized further to #4 cuffless trach. As a result, this Probation officer wrote orders for pt to wear PMV with all medical staff and during all therapies (except for consumption of POs). Education provided to pt's nurse and therapists. Information posted at head of bed.    PA aware that of pulmonary's recommendation for MBS. MBS currently scheduled on 12/10/18 at 0900 with [place]. Laurey Arrow made aware of current orders and plan.      Pain Pain Assessment Pain Scale: 0-10 Pain Score: 0-No pain  Therapy/Group: Individual Therapy  Biviana Saddler 12/09/2018, 2:16 PM

## 2018-12-09 NOTE — Progress Notes (Signed)
[facility] PHYSICAL MEDICINE & REHABILITATION PROGRESS NOTE   Subjective/Complaints: Uneventful night. No new complaints. Refusing therapies and meds at times  ROS: Limited due to cognitive/behavioral   Objective:   No results found. No results for input(s): WBC, HGB, HCT, PLT in the last 72 hours. No results for input(s): NA, K, CL, CO2, GLUCOSE, BUN, CREATININE, CALCIUM in the last 72 hours.  Intake/Output Summary (Last 24 hours) at 12/09/2018 0857 Last data filed at 12/08/2018 1807 Gross per 24 hour  Intake 600 ml  Output -  Net 600 ml     Physical Exam: Vital Signs Blood pressure 105/66, pulse 78, temperature 97.9 F (36.6 C), resp. rate 18, height 5\' 10"  (1.778 m), weight 70.9 kg, SpO2 99 %. Constitutional: No distress . Vital signs reviewed. HEENT: EOMI, oral membranes moist Neck: supple, #6 shiley Cardiovascular: RRR without murmur. No JVD    Respiratory: CTA Bilaterally without wheezes or rales. Normal effort    GI: BS +, non-tender, non-distended  Extremities: No clubbing, cyanosis, or edema Skin: No evidence of breakdown, no evidence of rash Neurologic: Cranial nerves II through XII intact, motor strength is 4/5 in bilateral deltoid, bicep, tricep, grip, hip flexor, knee extensors, ankle dorsiflexor and plantar flexor=stable. Follows simple commands, language seems intact.    Musculoskeletal: Full range of motion in all 4 extremities. No joint swelling    Assessment/Plan: 1. Functional deficits secondary to debilitiy from respiratory failure which require interdisciplinary therapy in a comprehensive inpatient rehab setting.  Physiatrist is providing close team supervision and 24 hour management of active medical problems listed below.  Physiatrist and rehab team continue to assess barriers to discharge/monitor patient progress toward functional and medical goals  Care Tool:  Bathing  Bathing activity did not occur: Refused Body parts bathed by patient:  Right arm, Left arm, Chest, Abdomen, Right upper leg, Left upper leg, Face   Body parts bathed by helper: Front perineal area, Buttocks, Left lower leg, Right lower leg     Bathing assist Assist Level: Moderate Assistance - Patient 50 - 74%     Upper Body Dressing/Undressing Upper body dressing   What is the patient wearing?: Pull over shirt    Upper body assist Assist Level: Moderate Assistance - Patient 50 - 74%    Lower Body Dressing/Undressing Lower body dressing      What is the patient wearing?: Pants     Lower body assist Assist for lower body dressing: Maximal Assistance - Patient 25 - 49%     Toileting Toileting Toileting Activity did not occur Landscape architect and hygiene only): Refused  Toileting assist Assist for toileting: Maximal Assistance - Patient 25 - 49%     Transfers Chair/bed transfer  Transfers assist     Chair/bed transfer assist level: Moderate Assistance - Patient 50 - 74%     Locomotion Ambulation   Ambulation assist   Ambulation activity did not occur: Safety/medical concerns          Walk 10 feet activity   Assist  Walk 10 feet activity did not occur: Refused        Walk 50 feet activity   Assist Walk 50 feet with 2 turns activity did not occur: Safety/medical concerns         Walk 150 feet activity   Assist Walk 150 feet activity did not occur: Safety/medical concerns         Walk 10 feet on uneven surface  activity   Assist Walk 10 feet on  uneven surfaces activity did not occur: Safety/medical concerns         Wheelchair     Assist   Type of Wheelchair: Manual Wheelchair activity did not occur: Refused         Wheelchair 50 feet with 2 turns activity    Assist    Wheelchair 50 feet with 2 turns activity did not occur: Refused       Wheelchair 150 feet activity     Assist Wheelchair 150 feet activity did not occur: Safety/medical concerns        Medical Problem  List and Plan: 1.Debility and metabolic encephalopathysecondary to acute on chronic respiratory failure status post tracheostomy 11/19/2018.Pulmonary services to follow-up on plan decannulation continue CIR PT, OT,SLP   -changing therapies to QD 2.  DVT prophyllaxis Subcutaneous heparin.no renal failure , switch to Lovenox 3. Pain Management:Tylenol as needed 4. Mood/bipolar disorder:Prozac 40 mg daily for mood stabilization, Klonopin 0.5 mg 3 times a day as needed for anxiety or agitation -monitoring sleep patterns -sleep chart 5. Neuropsych: This patientis notcapable of making decisions on herown behalf. 6. Skin/Wound Care:Routine skin checks 7. Fluids/Electrolytes/Nutrition:Routine in and out's PICC removed   8. Chronic hypotension. Patient had been on ProAmatine 10 mg 3 times a day as well as Florinef prior to admission.Resume as neededpendng tolerance of therapy Vitals:   12/09/18 0431 12/09/18 0631  BP:  105/66  Pulse: 73 78  Resp: 14 18  Temp:  97.9 F (36.6 C)  SpO2: 97% 99%  fair control at present 9.COPD with history of tobacco abuse. Patient on 2 L oxygen at home.Continue chronic prednisone. Follow-up pulmonary services Dr.Wert.Continue nebulizersas directed  -trach changed to #6 cuffelss by CCM 3/2 and tolerating well  -spoke with CCM this morning---> we will change to #4 today. They will see potentially tomorrow to plug trach     10. Seizure disorder. ContinueDepakoteas prior to admission.. EEG negative No clinical seizure activity 11.Hypothyroidism. Synthroid 12. Dysphagia. Dysphagia #2 nectar liquids. Follow-up speech therapy. Monitor hydration. 13. Polysubstance abuse. UDS positive for cocaine. Provide counseling 14. History of breast cancer with lumpectomy. Patient on Arimidex 1 mg daily prior to admission.OK to restart per Dr Burr Medico Heme/Onc also Dr Burr Medico wants pt to f/u since she hasn't been seen since 2018  15.   Leukocytosis with chronic steroid use, afeb 16.  Medication compliance, patient has a history of this, had not followed up with heme-onc for about 2 years  -ongoing issues with non-compliance here  -continue to educate as possible  ELOS: 12 days A FACE TO FACE EVALUATION WAS PERFORMED  Meredith Staggers 12/09/2018, 8:57 AM

## 2018-12-09 NOTE — Progress Notes (Signed)
Occupational Therapy Note  Patient Details  Name: Courtney Terry MRN: [id number] Date of Birth: May 07, 1961  Today's Date: 12/09/2018 OT Missed Time: 36 Minutes Missed Time Reason: Patient fatigue;Patient unwilling/refused to participate without medical reason  Pt missed 30 mins scheduled OT treatment session due to refusal.  RN present and assisting with hygiene upon arrival.  Pt reports fatigue and pain in buttocks, requesting to defer therapy at this time.  RN aware of pt status.  [name] 12/09/2018, 12:48 PM

## 2018-12-09 NOTE — Progress Notes (Signed)
Speech Language Pathology Daily Session Note  Patient Details  Name: Courtney Terry MRN: [id number] Date of Birth: 11-20-1960  Today's Date: 12/09/2018 SLP Individual Time: 3300-7622 SLP Individual Time Calculation (min): 22 min  Short Term Goals: Week 2: SLP Short Term Goal 1 (Week 2): Patient will consume current diet with minimal overt s/s of aspiration and Mod A verbal cues for use of swallowing compensatory srategies.  SLP Short Term Goal 2 (Week 2): Patient will consume trials of thin liquids without overt s/s of aspiration over 3 sessions prior to upgrade with Mod verbal cues.  SLP Short Term Goal 3 (Week 2): Patient will consume trials of Dys. 3 textures with efficient mastication and complete oral clearance over 2 sessions prior to upgrade.  SLP Short Term Goal 4 (Week 2): Patient will demonstrate sustained attention to tasks for 10 minutes with Mod A verbal cues for redirection.  SLP Short Term Goal 5 (Week 2): Patient will utilize multimodal communication (gestures, mouthing words, communication board) to express wants/needs with Mod A mulitmodal cues.  SLP Short Term Goal 6 (Week 2): Patient will tolerate PMSV for ~1 minute with all vitals remaining WFL without signs of distress with Max A multimodal cues.   Skilled Therapeutic Interventions:  Pt was seen for skilled ST targeting speech and swallowing goals.  Pt resting in bed upon arrival but was agreeable to participating in therapies at bedside.  Pt demonstrated no s/s of anxiety when therapist placed valve with use of therapeutic distraction (counting from 1-20) and did not even indicate awareness of wearing valve, stating "I'm not wearing the valve right now."  Pt was able to achieve phonation at the word-short phrase level with mod-max assist verbal cues to increase vocal intensity.  No changes in vital signs appreciated with valve in place and no evidence of trapping with valve removal.  Pt consumed dys 3 textures and nectar  thick liquids with supervision cues for use of swallowing precautions and no overt s/s of aspiration.  Pt was left in bed with call bell within reach.  Continue per current plan of care.    Pain Pain Assessment Pain Scale: 0-10 Pain Score: 0-No pain  Therapy/Group: Individual Therapy  Saber Dickerman, Selinda Orion 12/09/2018, 1:24 PM

## 2018-12-09 NOTE — Patient Care Conference (Signed)
Inpatient RehabilitationTeam Conference and Plan of Care Update Date: 12/08/2018   Time: 2:10 PM    Patient Name: Courtney Terry      Medical Record Number: [id number]  Date of Birth: 1961/03/14 Sex: Female         Room/Bed: 4W09C/4W09C-01 Payor Info: Payor: MEDICAID [place] / Plan: MEDICAID [place] ACCESS / Product Type: *No Product type* /    Admitting Diagnosis: debility  resp failure  Admit Date/Time:  11/27/2018  3:56 PM Admission Comments: No comment available   Primary Diagnosis:  <principal problem not specified> Principal Problem: <principal problem not specified>  Patient Active Problem List   Diagnosis Date Noted  . Bipolar affective disorder in remission ([place])   . Hypoxemia   . Acute respiratory failure with hypoxemia ([place])   . Tracheostomy care ([place])   . Metabolic encephalopathy 42/59/5638  . Tracheostomy status ([place])   . Acute respiratory failure with hypoxia and hypercapnia ([place]) 11/08/2018  . Severely underweight adult 09/13/2018  . COPD with acute exacerbation ([place]) 09/12/2018  . Syncope 09/12/2018  . Tobacco use disorder 05/01/2018  . Depression 01/26/2018  . CHF (congestive heart failure) ([place]) 11/11/2017  . PID (acute pelvic inflammatory disease) 11/11/2017  . Seizures ([place])   . Hypertension   . Palliative care encounter   . Goals of care, counseling/discussion   . Hypoxia   . Rhinovirus infection 09/30/2016  . Hypothyroidism 09/29/2016  . Bipolar I disorder ([place]) 09/29/2016  . Acute and chronic respiratory failure with hypercapnia ([place]) 09/28/2016  . Polysubstance abuse ([place]) 08/13/2016  . Cocaine abuse ([place]) 08/13/2016  . Schizophrenia ([place]) 04/23/2016  . COPD (chronic obstructive pulmonary disease) ([place]) 04/23/2016  . Chronic respiratory failure with hypoxia ([place]) 03/29/2016  . Acute on chronic respiratory failure ([place]) 02/28/2016  . Protein-calorie malnutrition, severe 02/27/2016  . Elevated troponin   . History of breast cancer 10/20/2015  .  Exposure of implanted prstht mtrl to surrnd org/tiss, init 06/19/2015  . Complication of internal breast prosthesis 06/19/2015  . Acquired absence of breast and nipple 06/06/2015  . H/O right mastectomy 06/06/2015  . COPD exacerbation ([place]) 06/29/2014  . COPD GOLD IV D 02/27/2013  . Malignant neoplasm of upper-outer quadrant of female breast ([place]) 01/31/2012  . Epilepsy ([place]) 12/02/2007    Expected Discharge Date: Expected Discharge Date: (SNF)  Team Members Present: Physician leading conference: Dr. Alger Simons Social Worker Present: Lennart Pall, LCSW Nurse Present: Mohammed Kindle, LPN PT Present: Other (comment)(Taylor Ervin Knack, PT) OT Present: Elisabeth Most, OT SLP Present: Charolett Bumpers, SLP PPS Coordinator present : Gunnar Fusi     Current Status/Progress Goal Weekly Team Focus  Medical   #6 trach in, still with secretions, refusing meds, ongoing cognitive impairments  stabilize medically for discharge  nutrition, pulmonary mgt   Bowel/Bladder   periods of incontinence of bowel and bladder. LBM 3/1  Regain regular pattern of bowel and bladder regimen, fewer episodes of incontinence  Times toileting and assistance with toileting needs.   Swallow/Nutrition/ Hydration   dys 3 and NTL, sup A  Sup A  dys 3 tolerance, regular and thin trials    ADL's   Min-Mod A for transfers, Max A for self care due to self limiting behaviors   Downgraded to Min-Mod A overall   Active therapy participation, functional transfers, OOB activity/tolerance    Mobility   min to mod A transfers, max A sit to stand, has refused w/c mobility or gait  min A overall  OOB tolerance, standing, participation  Communication   Mod A, multimodal gestures, aphonic mouthing word level  Sup A  aphonic speech intelligbilty strategies, tolerances of PMVS and phonation at word level. use of external aid for communication    Safety/Cognition/ Behavioral Observations  Max-Mod A, increased sustained attention,  basic problem solving during self-feeding, transfer in bed  MIn A  basic problen solving, sustained attention, recall with aid    Pain   No c/o pain  Remain free of pain  Assess pain qshift and PRN   Skin   stage 2 and stage 3 pressure wound to sacrum  Encouraged Pt to turn and reposition frequently. Keep wound clean dry and free of infection.   Assess skin qshift and PRN.      *See Care Plan and progress notes for long and short-term goals.     Barriers to Discharge  Current Status/Progress Possible Resolutions Date Resolved   Physician    Trach;Medical stability               Nursing                  PT  Inaccessible home environment;Decreased caregiver support;Medical stability;Home environment access/layout;Trach;Lack of/limited family support;Insurance for SNF coverage;Medication compliance;Behavior                 OT Decreased caregiver support;Medical stability;Home Copywriter, advertising;Incontinence;Lack of/limited family support;Insurance for SNF coverage;Trach;Nutrition means;Wound Care;Weight;Medication compliance;Behavior                SLP                SW                Discharge Planning/Teaching Needs:  DC plan has changed to SNF - hope to be able to remove trach soon which will make SNF placement more viable option.  NA   Team Discussion:  Change trach today to cuffless #6;  incont b/b.  Still with two sacral wounds.  Has been refusing txs sessions.  Ranges in assist level CGA - total dependent on her mood.  Tolerated PMSV x 3 mins today but needed cues to manage anxiety.  Currently on D3, nectar and hope to have MBS soon - MD wants to wait until trach downsized again.  SNF plan.  Revisions to Treatment Plan:  NA    Continued Need for Acute Rehabilitation Level of Care: The patient requires daily medical management by a physician with specialized training in physical medicine and rehabilitation for the following conditions: Daily direction of a  multidisciplinary physical rehabilitation program to ensure safe treatment while eliciting the highest outcome that is of practical value to the patient.: Yes Daily medical management of patient stability for increased activity during participation in an intensive rehabilitation regime.: Yes Daily analysis of laboratory values and/or radiology reports with any subsequent need for medication adjustment of medical intervention for : Neurological problems;Pulmonary problems   I attest that I was present, lead the team conference, and concur with the assessment and plan of the team.   Kuuipo Anzaldo 12/09/2018, 3:49 PM

## 2018-12-09 NOTE — Procedures (Signed)
Tracheostomy Change Note  Patient Details:   Name: Courtney Terry DOB: 07-20-1961 MRN: [id number]    Airway Documentation:     Evaluation  O2 sats: stable throughout Complications: No apparent complications Patient did tolerate procedure well. Bilateral Breath Sounds: Diminished    Patients trach changed from #6 shiley cuffless to #4 shiley cuffless without complications. Two RT's at bedside. Positive color change on capnography. Vitals are stable. Will continue to monitor.  [name] 12/09/2018, 12:40 PM

## 2018-12-09 NOTE — Progress Notes (Signed)
Trach site care and inner cannula care in the morning, deep suctioned patient; then exchanged trach for Shiley #4. Patient given klonopin prior to procedure and tolerated it well. Instructions to patient from SLP about when to wear PMV reinforced. Will pass on to oncoming RN.

## 2018-12-10 ENCOUNTER — Inpatient Hospital Stay (HOSPITAL_COMMUNITY): Payer: Medicaid Other

## 2018-12-10 ENCOUNTER — Inpatient Hospital Stay (HOSPITAL_COMMUNITY): Payer: Medicaid Other | Admitting: Occupational Therapy

## 2018-12-10 ENCOUNTER — Encounter (HOSPITAL_COMMUNITY): Payer: Medicaid Other | Admitting: Speech Pathology

## 2018-12-10 ENCOUNTER — Inpatient Hospital Stay (HOSPITAL_COMMUNITY): Payer: Medicaid Other | Admitting: Speech Pathology

## 2018-12-10 LAB — CBC
HCT: 36.7 % (ref 36.0–46.0)
Hemoglobin: 11.1 g/dL — ABNORMAL LOW (ref 12.0–15.0)
MCH: 27.3 pg (ref 26.0–34.0)
MCHC: 30.2 g/dL (ref 30.0–36.0)
MCV: 90.4 fL (ref 80.0–100.0)
Platelets: 430 10*3/uL — ABNORMAL HIGH (ref 150–400)
RBC: 4.06 MIL/uL (ref 3.87–5.11)
RDW: 15.4 % (ref 11.5–15.5)
WBC: 11.1 10*3/uL — ABNORMAL HIGH (ref 4.0–10.5)
nRBC: 0 % (ref 0.0–0.2)

## 2018-12-10 LAB — GLUCOSE, CAPILLARY
Glucose-Capillary: 107 mg/dL — ABNORMAL HIGH (ref 70–99)
Glucose-Capillary: 89 mg/dL (ref 70–99)
Glucose-Capillary: 109 mg/dL — ABNORMAL HIGH (ref 70–99)
Glucose-Capillary: 80 mg/dL (ref 70–99)

## 2018-12-10 LAB — BASIC METABOLIC PANEL
Anion gap: 9 (ref 5–15)
BUN: 13 mg/dL (ref 6–20)
CO2: 31 mmol/L (ref 22–32)
Calcium: 9.2 mg/dL (ref 8.9–10.3)
Chloride: 100 mmol/L (ref 98–111)
Creatinine, Ser: 0.32 mg/dL — ABNORMAL LOW (ref 0.44–1.00)
GFR calc Af Amer: >60 mL/min (ref >60)
GFR calc non Af Amer: >60 mL/min (ref >60)
Glucose, Bld: 84 mg/dL (ref 70–99)
Potassium: 4.5 mmol/L (ref 3.5–5.1)
Sodium: 140 mmol/L (ref 135–145)

## 2018-12-10 NOTE — Progress Notes (Addendum)
Physical Therapy Session Note  Patient Details  Name: Courtney Terry MRN: [id number] Date of Birth: [birth year]  Today's Date: 12/10/2018 PT Individual Time: 1300-1315 PT Individual Time Calculation (min): 15 min   Short Term Goals: Week 2:  PT Short Term Goal 1 (Week 2): =LTG due to ELOS  Skilled Therapeutic Interventions/Progress Updates:    Patient in supine asleep.  Easily aroused and agreeable to OOB for eating lunch in dayroom with SLP.  Supine to sit with S.  Assisted to stand with minguard for safety and transferred to w/c min A for safety/balance.  Placed on portable O2 for trach collar at 35%.  In w/c encouraged to participate in mobility and performed about 61' w/c mobility prior to requesting PT to assist.  Assisted to dayroom and seated in w/c at left at table with SLP present and encouraging communication with PMV.    Therapy Documentation Precautions:  Precautions Precautions: Fall Precaution Comments: Trach collar, supplemental 02 Restrictions Weight Bearing Restrictions: No General: PT Amount of Missed Time (min): 15 Minutes PT Missed Treatment Reason: Other (Comment)(eating lunch with SLP) Pain: Pain Assessment Pain Score: 0-No pain    Therapy/Group: Individual Therapy  Reginia Naas 12/10/2018, 5:36 PM

## 2018-12-10 NOTE — Plan of Care (Signed)
  Problem: Consults Goal: RH GENERAL PATIENT EDUCATION Description See Patient Education module for education specifics. Outcome: Progressing   Problem: RH BOWEL ELIMINATION Goal: RH STG MANAGE BOWEL WITH ASSISTANCE Description STG Manage Bowel with mod Assistance.   Outcome: Progressing   Problem: RH SKIN INTEGRITY Goal: RH STG SKIN FREE OF INFECTION/BREAKDOWN Description With mod assist   Outcome: Progressing Goal: RH STG MAINTAIN SKIN INTEGRITY WITH ASSISTANCE Description STG Maintain Skin Integrity With mod Assistance.   Outcome: Progressing   Problem: RH SAFETY Goal: RH STG ADHERE TO SAFETY PRECAUTIONS W/ASSISTANCE/DEVICE Description STG Adhere to Safety Precautions With cues/reminders Assistance/Device.  Outcome: Progressing   Problem: RH KNOWLEDGE DEFICIT GENERAL Goal: RH STG INCREASE KNOWLEDGE OF SELF CARE AFTER HOSPITALIZATION Description Family will be able to explain pt care needs and demonstrate ability to perform using handouts and resources independently  Outcome: Progressing

## 2018-12-10 NOTE — Plan of Care (Signed)
  Problem: Consults Goal: RH GENERAL PATIENT EDUCATION Description See Patient Education module for education specifics. Outcome: Progressing   Problem: RH SKIN INTEGRITY Goal: RH STG SKIN FREE OF INFECTION/BREAKDOWN Description With mod assist   Outcome: Progressing Goal: RH STG MAINTAIN SKIN INTEGRITY WITH ASSISTANCE Description STG Maintain Skin Integrity With mod Assistance.   Outcome: Progressing   Problem: RH SAFETY Goal: RH STG ADHERE TO SAFETY PRECAUTIONS W/ASSISTANCE/DEVICE Description STG Adhere to Safety Precautions With cues/reminders Assistance/Device.  Outcome: Progressing

## 2018-12-10 NOTE — Progress Notes (Signed)
Social Work Patient ID: Courtney Terry, female   DOB: Jun 09, 1961, 58 y.o.   MRN: [id number]   Have reviewed team conference with pt and family.  Continue to plan for SNF, however, hopeful we can remove trach soon as this will open up more available facilities.  Continue to follow.  Patterson Hollenbaugh, LCSW

## 2018-12-10 NOTE — Progress Notes (Signed)
Speech Language Pathology Daily Session Note  Patient Details  Name: TRESSA MALDONADO MRN: [id number] Date of Birth: 12-30-1960  Today's Date: 12/10/2018   Skilled treatment session #1 SLP Individual Time: 0900-0910 SLP Individual Time Calculation (min): 10 min   Skilled treatment session #2 SLP Individual Time: 0910-0930 SLP Individual Time Calculation (min): 77mn  Skilled treatment session #3 SLP Individual Time: 1315-1400 SLP Individual Time Calculation (min): 45 min  Short Term Goals: Week 2: SLP Short Term Goal 1 (Week 2): Patient will consume dysphagia 3 with thin liquids with minimal overt s/s of aspiration and Mod A verbal cues for use of swallowing compensatory strategies.  SLP Short Term Goal 1 - Progress (Week 2): Updated due to goal met SLP Short Term Goal 2 (Week 2): Patient will consume trials of thin liquids without overt s/s of aspiration over 3 sessions prior to upgrade with Mod verbal cues.  SLP Short Term Goal 2 - Progress (Week 2): Met SLP Short Term Goal 3 (Week 2): Patient will consume trials of Dys. 3 textures with efficient mastication and complete oral clearance over 2 sessions prior to upgrade.  SLP Short Term Goal 3 - Progress (Week 2): Met SLP Short Term Goal 4 (Week 2): Patient will demonstrate sustained attention to tasks for 10 minutes with Mod A verbal cues for redirection.  SLP Short Term Goal 5 (Week 2): Patient will utilize multimodal communication (gestures, mouthing words, communication board) to express wants/needs with Mod A mulitmodal cues.  SLP Short Term Goal 5 - Progress (Week 2): Met SLP Short Term Goal 6 (Week 2): Patient will tolerate PMSV without signs of distress for all communicative interactions.    Skilled Therapeutic Interventions:  Skilled treatment session #1 focused on preparing pt to transport to Radiology. There was initial confusion over how to transport pt to radiology given pt's bed is air mattress. Pt currently has #4  cuffless trach with supplemental 6 Liters via trach collar. SLP placed PMV upon entering room. Pt able to stand pivot to transfer to wheelchair and was transported to radiology while maintianing 9% on finger pulse ox. However as we transversed deeper into the hospital via elevators, pt's anxiety been to grow (increase in tremors, increased work of breathing). O2 dropped to 85%, PMV removed for transfer to radiology chair. No sign of ar trapping when PMV removed. Despite PMV being removed, pt sats didn't rise past 85% while in radiology. Pt engaged in deep breathing and Max A provided to promote relaxation. Despite this, pt continued to sat at 85% without PMV in place. Holding a pillow helped pt with positioning to capture pharyngeal region on fluro. Although this situation wasn't ideal, pt was alert and able to participate in study.  MBS completed. PMV removed for transport with SLP back to room.  Pt with continuous reading of 85%. Pt appeared relieved when etering her room and was impulsive to return to bed and lay down. Once placed on placed on continous pulse ox and her sats were 92% and climbing as she closed her eyes.  Suspect that ability to tolerate PMV will be hindered by poor physical endurance and general respiratory compromise as pt's sats didn't rise when PMV removed. Information regarding respiratory ability was conveyed to pulmonary (Laurey Arrow.   Skilled treatment session #2 - completion of MBS. See information under MBS.   Skilled treatment session #3 focused on dysphagia and communication goals. SLP facilitated session by donning PMSV and pt tolerated for PMSV placement with O2 sats at  95% during physical transfer to wheelchair and self-propelling wheelchair to door. Pt tolerated being upright in wheelchair in dayroom. SLP further facilitated session by providing skilled observation of pt consuming dysphagia 3 lunch tray with thin liquids. Pt with no overt s/s of aspiration and no change in vitals.  After consuming lunch tray, pt consumed 2 snack bags of potato chips as well as an ice cream. Pt required cues for bolus size x 2 when consuming potato chips. Pt able to achieve ~ 75% speech intelligibility at the sentence level in noisy environment. She appeared to enjoy interacting with another therapist and pt who were present.   Of note, pt also participated in additional session after her scheduled session. She aided another pt in baking cookie dough. In total pt sat upright and was participatory for ~ 2 hours, tolerated PMSV ~ 2 hours. Education provided to pt's nurse on current orders and will consult team for increased therapy times.      Pain Pain Assessment Pain Scale: 0-10 Pain Score: 0-No pain Faces Pain Scale: No hurt  Therapy/Group: Individual Therapy  Carola Viramontes 12/10/2018, 11:49 AM

## 2018-12-10 NOTE — Progress Notes (Signed)
Occupational Therapy Session Note  Patient Details  Name: Courtney Terry MRN: [id number] Date of Birth: 1961-05-22  Today's Date: 12/10/2018 OT Individual Time: 1130-1200 OT Individual Time Calculation (min): 30 min    Short Term Goals: Week 2:  OT Short Term Goal 1 (Week 2): STGs=LTGs due to ELOS  Skilled Therapeutic Interventions/Progress Updates:    Upon entering the room, pt on side in bed and PMSV applied to increase communication with pt. Pt verbalized incontinence and needing to be changed. OT discussed pt needing to call for assistance secondary to pt already having skin breakdown. Pt just shrugged shoulders as response. Pt rolling to side with supervision and assistance for hygiene and LB clothing management. RN present to change dressing on buttocks and pt remaining in side lying position with pillow between knees for comfort. PMSV removed before therapist exiting the room. Call bell and all needed items within reach. Bed lowered and mats placed on floor for safety.   Therapy Documentation Precautions:  Precautions Precautions: Fall Precaution Comments: Trach collar, supplemental 02 Restrictions Weight Bearing Restrictions: No General:   Vital Signs: Therapy Vitals Pulse Rate: 77 Resp: 18 Patient Position (if appropriate): Lying Oxygen Therapy SpO2: 95 % O2 Device: Tracheostomy Collar O2 Flow Rate (L/min): 8 L/min FiO2 (%): 35 % Pain: Pain Assessment Pain Scale: 0-10 Pain Score: 0-No pain ADL: ADL Eating: Not assessed Grooming: Supervision/safety Where Assessed-Grooming: Edge of bed Upper Body Bathing: Minimal assistance(for thoroughness) Where Assessed-Upper Body Bathing: Edge of bed Lower Body Bathing: Maximal assistance Where Assessed-Lower Body Bathing: Bed level, Edge of bed Upper Body Dressing: Maximal assistance Where Assessed-Upper Body Dressing: Edge of bed Lower Body Dressing: Maximal assistance Where Assessed-Lower Body Dressing: Edge of  bed Toileting: Not assessed Toilet Transfer: Moderate assistance Toilet Transfer Method: Stand pivot, Squat pivot Toilet Transfer Equipment: Bedside commode, Drop arm bedside commode Tub/Shower Transfer: Not assessed   Therapy/Group: Individual Therapy  Gypsy Decant 12/10/2018, 2:13 PM

## 2018-12-10 NOTE — Progress Notes (Signed)
Modified Barium Swallow Progress Note  Patient Details  Name: Courtney Terry MRN: [id number] Date of Birth: 02/03/1961  Today's Date: 12/10/2018  Modified Barium Swallow completed.  Full report located under Chart Review in the Imaging Section.  Brief recommendations include the following:  Clinical Impression  While not ideal, MBS completed with pt's O2 sats at 85%, supplemental O2 provided via 6 liters trach collar (see treatment note for rationale for continuing MBS). Pt currently has #4 cuffless trach and swallow function assessed with and without PMV in place. In both situations, pt presented with functional pharyngeal abilities as evidenced by timely swallow and no aspiration when consuming thin liquids via cup and straw sips.  Would recommend upgrading to thin liquids with PMV in place.    Swallow Evaluation Recommendations   Recommended Consults: (Pulmonary is following pt )   SLP Diet Recommendations: Dysphagia 2 (Fine chop) solids;Thin liquid   Liquid Administration via: Cup;Straw   Medication Administration: Whole meds with puree   Supervision: Staff to assist with self feeding   Compensations: Slow rate;Small sips/bites;Minimize environmental distractions   Postural Changes: (MUST BE OUT OF BED IN [facility])   Oral Care Recommendations: Oral care BID        Shenica Holzheimer 12/10/2018,11:33 AM

## 2018-12-10 NOTE — Progress Notes (Signed)
[place] PHYSICAL MEDICINE & REHABILITATION PROGRESS NOTE   Subjective/Complaints: Slept well. No changes in breathing. Tolerated trach without issues. No secretions  ROS: Limited due to cognitive/behavioral   Objective:   No results found. Recent Labs    12/10/18 0530  WBC 11.1*  HGB 11.1*  HCT 36.7  PLT 430*   Recent Labs    12/10/18 0530  NA 140  K 4.5  CL 100  CO2 31  GLUCOSE 84  BUN 13  CREATININE 0.32*  CALCIUM 9.2    Intake/Output Summary (Last 24 hours) at 12/10/2018 0854 Last data filed at 12/10/2018 0836 Gross per 24 hour  Intake 640 ml  Output -  Net 640 ml     Physical Exam: Vital Signs Blood pressure 95/64, pulse 68, temperature 98.6 F (37 C), temperature source Oral, resp. rate 15, height 5\' 10"  (1.778 m), weight 75.3 kg, SpO2 98 %. Constitutional: No distress . Vital signs reviewed. HEENT: EOMI, oral membranes moist Neck: supple, #4 shiley Cardiovascular: RRR without murmur. No JVD    Respiratory: CTA Bilaterally without wheezes or rales. Normal effort    GI: BS +, non-tender, non-distended  Extremities: No clubbing, cyanosis, or edema Skin: No evidence of breakdown, no evidence of rash Neurologic: did attend our conversation and seemed to comprehend to a point. Cranial nerves II through XII intact, motor strength is 4/5 in bilateral deltoid, bicep, tricep, grip, hip flexor, knee extensors, ankle dorsiflexor and plantar flexor=stable. Follows simple commands, language seems intact.    Musculoskeletal: normal ROM    Assessment/Plan: 1. Functional deficits secondary to debilitiy from respiratory failure which require interdisciplinary therapy in a comprehensive inpatient rehab setting.  Physiatrist is providing close team supervision and 24 hour management of active medical problems listed below.  Physiatrist and rehab team continue to assess barriers to discharge/monitor patient progress toward functional and medical goals  Care  Tool:  Bathing  Bathing activity did not occur: Refused Body parts bathed by patient: Right arm, Left arm, Chest, Abdomen, Right upper leg, Left upper leg, Face   Body parts bathed by helper: Front perineal area, Buttocks, Left lower leg, Right lower leg     Bathing assist Assist Level: Moderate Assistance - Patient 50 - 74%     Upper Body Dressing/Undressing Upper body dressing   What is the patient wearing?: Pull over shirt    Upper body assist Assist Level: Moderate Assistance - Patient 50 - 74%    Lower Body Dressing/Undressing Lower body dressing      What is the patient wearing?: Pants     Lower body assist Assist for lower body dressing: Maximal Assistance - Patient 25 - 49%     Toileting Toileting Toileting Activity did not occur Landscape architect and hygiene only): Refused  Toileting assist Assist for toileting: Maximal Assistance - Patient 25 - 49%     Transfers Chair/bed transfer  Transfers assist     Chair/bed transfer assist level: Moderate Assistance - Patient 50 - 74%     Locomotion Ambulation   Ambulation assist   Ambulation activity did not occur: Safety/medical concerns          Walk 10 feet activity   Assist  Walk 10 feet activity did not occur: Refused        Walk 50 feet activity   Assist Walk 50 feet with 2 turns activity did not occur: Safety/medical concerns         Walk 150 feet activity   Assist Walk 150  feet activity did not occur: Safety/medical concerns         Walk 10 feet on uneven surface  activity   Assist Walk 10 feet on uneven surfaces activity did not occur: Safety/medical concerns         Wheelchair     Assist   Type of Wheelchair: Manual Wheelchair activity did not occur: Refused         Wheelchair 50 feet with 2 turns activity    Assist    Wheelchair 50 feet with 2 turns activity did not occur: Refused       Wheelchair 150 feet activity     Assist  Wheelchair 150 feet activity did not occur: Safety/medical concerns        Medical Problem List and Plan: 1.Debility and metabolic encephalopathysecondary to acute on chronic respiratory failure status post tracheostomy 11/19/2018.Pulmonary services to follow-up on plan decannulation continue CIR PT, OT,SLP   -changing therapies to QD. Spoke to patient about participation/lack thereof 2.  DVT prophyllaxis Subcutaneous heparin.no renal failure , switch to Lovenox 3. Pain Management:Tylenol as needed 4. Mood/bipolar disorder:Prozac 40 mg daily for mood stabilization, Klonopin 0.5 mg 3 times a day as needed for anxiety or agitation -fair sleep patterns 5. Neuropsych: This patientis notcapable of making decisions on herown behalf. 6. Skin/Wound Care:Routine skin checks 7. Fluids/Electrolytes/Nutrition:encourage PO  8. Chronic hypotension. Patient had been on ProAmatine 10 mg 3 times a day as well as Florinef prior to admission.Resume as neededpendng tolerance of therapy Vitals:   12/10/18 0429 12/10/18 0558  BP:  95/64  Pulse: 77 68  Resp: 14 15  Temp:  98.6 F (37 C)  SpO2: 97% 98%  fair control at present 9.COPD with history of tobacco abuse. Patient on 2 L oxygen at home.Continue chronic prednisone. Follow-up pulmonary services Dr.Wert.Continue nebulizersas directed  -now with #4 cuffless  - PMV/plugging trials.   -potential decannulation Monday     10. Seizure disorder. ContinueDepakoteas prior to admission.. EEG negative No clinical seizure activity 11.Hypothyroidism. Synthroid 12. Dysphagia. Dysphagia #3 nectar liquids. Advance per SLP  -follow up swallowing study after trach removal. 13. Polysubstance abuse. UDS positive for cocaine. Provide counseling 14. History of breast cancer with lumpectomy. Patient on Arimidex 1 mg daily prior to admission.OK to restart per Dr Burr Medico Heme/Onc also Dr Burr Medico wants pt to f/u since she hasn't been seen  since 2018  15.  Leukocytosis with chronic steroid use, afeb 16.  Medication compliance an ongoing issue. Continued ed as possible   ELOS: 13 days A FACE TO FACE EVALUATION WAS PERFORMED  Meredith Staggers 12/10/2018, 8:54 AM

## 2018-12-11 ENCOUNTER — Inpatient Hospital Stay (HOSPITAL_COMMUNITY): Payer: Medicaid Other | Admitting: Occupational Therapy

## 2018-12-11 ENCOUNTER — Inpatient Hospital Stay (HOSPITAL_COMMUNITY): Payer: Medicaid Other | Admitting: Physical Therapy

## 2018-12-11 ENCOUNTER — Inpatient Hospital Stay (HOSPITAL_COMMUNITY): Payer: Medicaid Other | Admitting: Speech Pathology

## 2018-12-11 DIAGNOSIS — J441 Chronic obstructive pulmonary disease with (acute) exacerbation: Secondary | ICD-10-CM

## 2018-12-11 LAB — GLUCOSE, CAPILLARY
GLUCOSE-CAPILLARY: 77 mg/dL (ref 70–99)
Glucose-Capillary: 102 mg/dL — ABNORMAL HIGH (ref 70–99)
Glucose-Capillary: 100 mg/dL — ABNORMAL HIGH (ref 70–99)
Glucose-Capillary: 183 mg/dL — ABNORMAL HIGH (ref 70–99)

## 2018-12-11 MED ORDER — COLLAGENASE 250 UNIT/GM EX OINT
TOPICAL_OINTMENT | Freq: Every day | CUTANEOUS | Status: DC
  Administered 2018-12-11 – 2018-12-23 (×13): via TOPICAL
  Filled 2018-12-11: qty 30

## 2018-12-11 NOTE — Plan of Care (Signed)
  Problem: Consults Goal: RH GENERAL PATIENT EDUCATION Description See Patient Education module for education specifics. Outcome: Progressing   Problem: RH BOWEL ELIMINATION Goal: RH STG MANAGE BOWEL WITH ASSISTANCE Description STG Manage Bowel with mod Assistance.   Outcome: Progressing   Problem: RH SKIN INTEGRITY Goal: RH STG SKIN FREE OF INFECTION/BREAKDOWN Description With mod assist   Outcome: Progressing   Problem: RH SAFETY Goal: RH STG ADHERE TO SAFETY PRECAUTIONS W/ASSISTANCE/DEVICE Description STG Adhere to Safety Precautions With cues/reminders Assistance/Device.  Outcome: Progressing

## 2018-12-11 NOTE — Progress Notes (Signed)
Physical Therapy Session Note  Patient Details  Name: Courtney Terry MRN: [id number] Date of Birth: 1961/06/15  Today's Date: 12/11/2018 PT Individual Time: 1030-1100 PT Individual Time Calculation (min): 30 min   Short Term Goals: Week 2:  PT Short Term Goal 1 (Week 2): =LTG due to ELOS  Skilled Therapeutic Interventions/Progress Updates:    Pt received seated in bed, agreeable to PT session. Pt requesting icecream and agreeable with encouragement to get up to w/c to eat her icecream. Bed mobility Supervision. Stand pivot transfer bed to w/c with CGA for safety. Pt is impulsive and attempts to perform transfer before therapist is able to assist her. Pt also reaching for icecream to eat prior to being set up safely in w/c. Pt is setup A to feed herself icecream with v/c for small bites. Manual w/c propulsion x 150 ft with use of BUE and Supervision. Pt apologizes for behavior over previous days in which she was resistant to participation, expresses frustration with not being able to communicate and is grateful to be able to communicate with use of PMSV now. Pt reports she would like to try ambulation during next PT session. Practiced donning/doffing PMSV while seated in front of mirror, pt requires some cueing for technique. Pt requests to lay back down at end of session. Stand pivot transfer w/c to bed with min A due to continued impulsivity from patient. Sit to sidelying Supervision. Pt left sidelying in bed with needs in reach, bed alarm in place.  Therapy Documentation Precautions:  Precautions Precautions: Fall Precaution Comments: Trach collar, supplemental 02 Restrictions Weight Bearing Restrictions: No Pain: Pain Assessment Pain Scale: 0-10 Pain Score: 0-No pain Faces Pain Scale: No hurt    Therapy/Group: Individual Therapy   Excell Seltzer, PT, DPT  12/11/2018, 12:18 PM

## 2018-12-11 NOTE — Progress Notes (Addendum)
NAME:  Courtney Terry, MRN:  [id number], DOB:  14-Sep-1961, LOS: 36 ADMISSION DATE:  11/27/2018, CONSULTATION DATE:  11/08/18 REFERRING MD:  Karmen Bongo, MD, CHIEF COMPLAINT:  Respiratory failure    Brief History   58 year old female with COPD (FEV1 25%) and chronic hypoxemic respiratory failure. Admitted 2/2-2/21 with acute on chronic hypoxemic and hypercarbic respiratory failure requiring intubation. UDS positive for cocaine. Tracheostomy placement on 2/13. 2/21 Admitted to [facility].  PCCM asked to consult 2/27 for tracheostomy care.   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 HITT negative.  2/16 Sedate, gtt's being weaned off, on ATC during day but then required MV for resp distress 2/21 IP Rehab  3/4: Downsized to size 4 cuffless tracheostomy Consults:  PCCM   Procedures:  ETT 2/2 >> 2/13 Trach 2/13 >> R PICC 2/13 > 2/26   Significant Diagnostic Tests:  CTA 2/2: Neg for PE, emphysema, multiple pulmonary nodules, ETT in place CT head w/o contrast 2/2: stable/normal EEG 2/3: moderate global encephalopathy,  No epileptiform activities  UDS 2/2 > cocaine  Micro Data:  Resp PCR 2/2 > negative  UC 2/2 > 100k corynebacterium  Sputum 2/2 > normal flora  BCx2 2/2 > negative  Cervical Swab 2/14 > trichomonas   Antimicrobials:  Azithromycin 2/2 > 2/6 Ceftriaxone 2/3 >2/7 Flagyl 2/14 >off  Interim history/subjective:  Doing well  Objective   Blood pressure 112/80, pulse 81, temperature 98.9 F (37.2 C), temperature source Oral, resp. rate 19, height 5\' 10"  (1.778 m), weight 75.1 kg, SpO2 95 %.    FiO2 (%):  [30 %-35 %] 30 %   Intake/Output Summary (Last 24 hours) at 12/11/2018 1039 Last data filed at  12/10/2018 2151 Gross per 24 hour  Intake 620 ml  Output no documentation  Net 620 ml   Filed Weights   12/09/18 0500 12/10/18 0555 12/11/18 0420  Weight: 70.9 kg 75.3 kg 75.1 kg   EXAM: General: This is a frail 58 year old female she is resting in bed she is in no acute distress this morning her phonation is strong with Passy-Muir valve HEENT normocephalic atraumatic no jugular venous distention size 4 cuffless Shiley tracheostomy is unremarkable Pulmonary: Clear to auscultation diminished bases no accessory use Cardiac: Regular rate and rhythm Abdomen: Soft nontender Extremities: Warm and dry Neuro: Intact  Resolved Hospital Problem list     Assessment & Plan:   Acute on Chronic Hypoxemic and Hypercarbic Respiratory Failure in setting of severe COPD (FEV1 25%), s/p tracheostomy sedated Trach status  Discussion Tolerated downsizing to size 4 well.  Still has trouble with stamina as far as balancing exertion and Passy-Muir valve.  I do not think she is quite ready for capping trial yet from a stamina standpoint.  Although she seems more motivated to work towards this today than she had in the past  Plan We will continue PMV trial as much as tolerated.  I will write an order that allows her to have the PMV in place while family is in the room as well We will see her again on Monday if she is tolerating PMV more regularly will decide on capping trials at that point  I remain hopeful we will be able to get her tracheostomy out in  the near future  Erick Colace ACNP-BC [facility] Pager # [phone] OR # [phone] if no answer  Attending Note:  58 year old female with acute on chronic hypoxemic respiratory failure due to COPD and was eventually trached. Patient is now in CIR and with a cuffless 4 trach doing well with no events overnight.  On exam, lungs are clear and the trach is in a good position.  Discussed with PCCM-NP.   Hypoxemia:  - Titrate O2 for sat  of 88-92%  COPD:  - BD as ordered  Trach status:  - Continue PMV  - No trach cap for now, if does well over the weekend will cap on Monday   Will see again on Monday  Patient seen and examined, agree with above note.  I dictated the care and orders written for this patient under my direction.  Rush Farmer, [place]

## 2018-12-11 NOTE — Progress Notes (Signed)
Speech Language Pathology Weekly Progress and Session Note  Patient Details  Name: Courtney Terry MRN: [id number] Date of Birth: April 13, 1961  Beginning of progress report period: December 04, 2018 End of progress report period: December 11, 2018  Today's Date: 12/11/2018   Skilled treatment session #1 SLP Individual Time: 3151-7616 SLP Individual Time Calculation (min): 45 min  Skilled treatment session #2 SLP Individual Time: 1345-1430 SLP Individual Time Calculation (min): 45 min  Short Term Goals: Week 2: SLP Short Term Goal 1 (Week 2): Patient will consume dysphagia 3 with thin liquids with minimal overt s/s of aspiration and Mod A verbal cues for use of swallowing compensatory strategies.  SLP Short Term Goal 1 - Progress (Week 2): Progressing toward goal SLP Short Term Goal 2 (Week 2): Patient will consume trials of thin liquids without overt s/s of aspiration over 3 sessions prior to upgrade with Mod verbal cues.  SLP Short Term Goal 2 - Progress (Week 2): Met SLP Short Term Goal 3 (Week 2): Patient will consume trials of Dys. 3 textures with efficient mastication and complete oral clearance over 2 sessions prior to upgrade.  SLP Short Term Goal 3 - Progress (Week 2): Met SLP Short Term Goal 4 (Week 2): Patient will demonstrate sustained attention to tasks for 10 minutes with Mod A verbal cues for redirection.  SLP Short Term Goal 4 - Progress (Week 2): Met SLP Short Term Goal 5 (Week 2): Patient will utilize multimodal communication (gestures, mouthing words, communication board) to express wants/needs with Mod A mulitmodal cues.  SLP Short Term Goal 5 - Progress (Week 2): Met SLP Short Term Goal 6 (Week 2): Patient will tolerate PMSV without signs of distress for all communicative interactions.   SLP Short Term Goal 6 - Progress (Week 2): Met    New Short Term Goals: Week 3: SLP Short Term Goal 1 (Week 3): Patient will consume dysphagia 3 with thin liquids with minimal overt s/s  of aspiration and Min A verbal cues for use of swallowing compensatory strategies.  SLP Short Term Goal 2 (Week 3): Pt will demonstrate selective attention to tasks for 30 minutes in midlly distracting environment with Min A cues.  SLP Short Term Goal 3 (Week 3): Given Mod A cues, pt will increase vocal intensity to achieve ~ 75% intelligibility at the phrase level in 8 out of 10 opportunities.   SLP Short Term Goal 4 (Week 3): Pt will complete basic problem solving tasks with Mod A cues.  SLP Short Term Goal 5 (Week 3): Pt will donn and doff PMSV with Min A cues in 8 out of 10 opportunities.   Weekly Progress Updates: Pt has made good progress and as a result she has met several goals. Currently she is tolerating a #4 cuffless trach with PMSV throughout the day. Additionally, she is consuming thin liquids without overt s/s of aspiration. Pt continues to require skilled ST to target communication and cognitive deficits in problem solving, attention and awareness.      Intensity: Minumum of 1-2 x/day, 30 to 90 minutes Frequency: 3 to 5 out of 7 days Duration/Length of Stay: pending SNF placement Treatment/Interventions: Cognitive remediation/compensation;Environmental controls;Internal/external aids;Speech/Language facilitation;Therapeutic Activities;Patient/family education;Functional tasks;Dysphagia/aspiration precaution training;Cueing hierarchy   Daily Session  Skilled Therapeutic Interventions:   Skilled treatment session #1 focused on communication and dysphagia goals. SLP facilitated session by donning pt's PMSV and transferring pt to wheelchair for breakfast. With PMSV in place pt communicated that she wanted to put on blue  pants. Pt was Min A for donning pants and CG for stand pivot transfer to wheelchair from bed. SLP provided education to pt's nurse that pt must be out of bed, in wheelchair and in dayroom when consuming meals. Pt demonstrates increased ability to self-feed and consumes  100% of meals when consuming at high-low table in dayroom. Additionally, pt's MD present and education provided on results of MBS, increased toleration and willingness to participate in therapy (~ 2 hours yesterday) and need for increased PMSV wear time. MD agreeable to changing pt's therapy to 15 over 7.  This Probation officer also communicated with pt's primary OT/PT regarding new plan and they both are agreeable. Education also provided to team members on effective ways to communicate with pt, such as informing pt of therapy activities vs. asking her to participate. SLP further faciltiated session by proivdng skilled observation of pt consuming dysphagia 3 breakfast tray with thin liquids. Pt consumed with no change in vitals (O2 ~ 95% throughout) and no s/s of aspiration. After breakfast ,SLP introduced card game - BLIND WAR - SLP closed eyes and pt had to verbalize which card was higher to promote phonation and increase intelligibility. Pt able to phonate with increased vocal intensity at the word level. However WOB increased after ~ 7 turns. PMSV removed and pt had decreased WOB (not air trapping evident). Clinically, pt able to tolerate ~ 40 minutes of PMSV when engaging in transfers, consumption of breakfast and then 10 minutes of producing word level utterances. Pt returned to room, transfered to bed.    Skilled treatment session #2 focused on communication and dysphagia. SLP received pt in bed with PMSV in place. Pt able to vocalize with appropriate intensity and transferred to wheelchair, taken to day room and pt consumed chips with intermittent cues for rate. Pt able to communicate via sentences with some evidence of self-monitoring. Pt handed off to nursing.       General    Pain Pain Assessment Pain Scale: 0-10 Pain Score: 0-No pain  Therapy/Group: Individual Therapy  Courtney Terry 12/11/2018, 9:59 AM

## 2018-12-11 NOTE — Progress Notes (Signed)
Patient has slept most of the night, without incidence. Suctioned x2, so far this shift. Trach care provided this am. Pt resistive to personal care at times. Refused synthroid this am. Incontinent of bowel and bladder this shift. CHG bath performed this am.

## 2018-12-11 NOTE — Progress Notes (Signed)
Occupational Therapy Session Note  Patient Details  Name: Courtney Terry MRN: [id number] Date of Birth: 01/20/1961  Today's Date: 12/11/2018 OT Individual Time: 1135-1203 OT Individual Time Calculation (min): 28 min   Short Term Goals: Week 2:  OT Short Term Goal 1 (Week 2): STGs=LTGs due to ELOS    Skilled Therapeutic Interventions/Progress Updates:    Pt greeted in bed, reporting 10/10 pain in buttocks (wearing PMSV). Amenable to propel down to RN station to ask RN for tylenol. Supine<sit completed with supervision, and steady assist for stand pivot<w/c with vcs for clearing armrest. Pt propelled w/c ~50 ft to RN station and took her medicine with supervision. She then propelled about 50 ft back to room with vcs for increasing pumps with Rt to decrease Rt veering. Once in room, provided pt with max encouragement to remain OOB. Discussed pressure relief techniques in w/c, including lateral leans and small push ups. Pt adamant that she wanted to return to bed due to buttocks pain. When OT retrieved gloves to assist during transfer, pt transferred herself to bed via stand pivot and returned to supine. She refused to allow OT to assist with repositioning in bed. Pt left with all needs within reach and bed alarm set.   02 sats <92% via portable tank. Fi31% per consultation with RT  Therapy Documentation Precautions:  Precautions Precautions: Fall Precaution Comments: Trach collar, supplemental 02 Restrictions Weight Bearing Restrictions: No Vital Signs: Therapy Vitals Pulse Rate: 91 Resp: 18 Patient Position (if appropriate): Lying Oxygen Therapy SpO2: 95 % O2 Device: Tracheostomy Collar O2 Flow Rate (L/min): 6 L/min FiO2 (%): 30 % Pain: Pain Assessment Pain Scale: 0-10 Pain Score: 0-No pain Faces Pain Scale: No hurt ADL: ADL Eating: Not assessed Grooming: Supervision/safety Where Assessed-Grooming: Edge of bed Upper Body Bathing: Minimal assistance(for thoroughness) Where  Assessed-Upper Body Bathing: Edge of bed Lower Body Bathing: Maximal assistance Where Assessed-Lower Body Bathing: Bed level, Edge of bed Upper Body Dressing: Maximal assistance Where Assessed-Upper Body Dressing: Edge of bed Lower Body Dressing: Maximal assistance Where Assessed-Lower Body Dressing: Edge of bed Toileting: Not assessed Toilet Transfer: Moderate assistance Toilet Transfer Method: Stand pivot, Squat pivot Toilet Transfer Equipment: Bedside commode, Drop arm bedside commode Tub/Shower Transfer: Not assessed     Therapy/Group: Individual Therapy  Lyris Hitchman A Kaeleigh Westendorf 12/11/2018, 12:45 PM

## 2018-12-11 NOTE — Plan of Care (Signed)
  Problem: Consults Goal: RH GENERAL PATIENT EDUCATION Description See Patient Education module for education specifics. 12/11/2018 1152 by Gerald Stabs, LPN Outcome: Progressing 12/11/2018 1152 by Gerald Stabs, LPN Outcome: Progressing   Problem: RH BOWEL ELIMINATION Goal: RH STG MANAGE BOWEL WITH ASSISTANCE Description STG Manage Bowel with mod Assistance.   12/11/2018 1152 by Gerald Stabs, LPN Outcome: Progressing 12/11/2018 1152 by Gerald Stabs, LPN Outcome: Progressing   Problem: RH SKIN INTEGRITY Goal: RH STG SKIN FREE OF INFECTION/BREAKDOWN Description With mod assist   12/11/2018 1152 by Gerald Stabs, LPN Outcome: Progressing 12/11/2018 1152 by Gerald Stabs, LPN Outcome: Progressing Goal: RH STG MAINTAIN SKIN INTEGRITY WITH ASSISTANCE Description STG Maintain Skin Integrity With mod Assistance.   12/11/2018 1152 by Gerald Stabs, LPN Outcome: Progressing 12/11/2018 1152 by Gerald Stabs, LPN Outcome: Progressing   Problem: RH SAFETY Goal: RH STG ADHERE TO SAFETY PRECAUTIONS W/ASSISTANCE/DEVICE Description STG Adhere to Safety Precautions With cues/reminders Assistance/Device.  12/11/2018 1152 by Gerald Stabs, LPN Outcome: Progressing 12/11/2018 1152 by Gerald Stabs, LPN Outcome: Progressing   Problem: RH KNOWLEDGE DEFICIT GENERAL Goal: RH STG INCREASE KNOWLEDGE OF SELF CARE AFTER HOSPITALIZATION Description Family will be able to explain pt care needs and demonstrate ability to perform using handouts and resources independently  12/11/2018 1152 by Gerald Stabs, LPN Outcome: Progressing 12/11/2018 1152 by Gerald Stabs, LPN Outcome: Progressing

## 2018-12-11 NOTE — Progress Notes (Signed)
[employer] [facility] PHYSICAL MEDICINE & REHABILITATION PROGRESS NOTE   Subjective/Complaints: Up with SLP. Had a good night. Handling PMV in bursts. Still refusing meds at times  ROS: Limited due to cognitive/behavioral   Objective:   No results found. Recent Labs    12/10/18 0530  WBC 11.1*  HGB 11.1*  HCT 36.7  PLT 430*   Recent Labs    12/10/18 0530  NA 140  K 4.5  CL 100  CO2 31  GLUCOSE 84  BUN 13  CREATININE 0.32*  CALCIUM 9.2    Intake/Output Summary (Last 24 hours) at 12/11/2018 0949 Last data filed at 12/10/2018 2151 Gross per 24 hour  Intake 620 ml  Output -  Net 620 ml     Physical Exam: Vital Signs Blood pressure 112/80, pulse 81, temperature 98.9 F (37.2 C), temperature source Oral, resp. rate 19, height 5\' 10"  (1.778 m), weight 75.1 kg, SpO2 95 %. Constitutional: No distress . Vital signs reviewed.  HEENT: EOMI, oral membranes moist Neck: supple with #4 shiley/ PMV Cardiovascular: RRR without murmur. No JVD    Respiratory: CTA Bilaterally without wheezes or rales. Normal effort    GI: BS +, non-tender, non-distended  Extremities: No clubbing, cyanosis, or edema Skin: No evidence of breakdown, no evidence of rash Neurologic: alert, attends to conversation. Moves all 4's, 4/5 grossly    language seems intact.    Musculoskeletal: normal ROM Psych: pleasant and cooperativer   Assessment/Plan: 1. Functional deficits secondary to debilitiy from respiratory failure which require interdisciplinary therapy in a comprehensive inpatient rehab setting.  Physiatrist is providing close team supervision and 24 hour management of active medical problems listed below.  Physiatrist and rehab team continue to assess barriers to discharge/monitor patient progress toward functional and medical goals  Care Tool:  Bathing  Bathing activity did not occur: Refused Body parts bathed by patient: Right arm, Left arm, Chest, Abdomen, Right upper leg, Left upper leg, Face    Body parts bathed by helper: Front perineal area, Buttocks, Left lower leg, Right lower leg     Bathing assist Assist Level: Moderate Assistance - Patient 50 - 74%     Upper Body Dressing/Undressing Upper body dressing   What is the patient wearing?: Pull over shirt    Upper body assist Assist Level: Moderate Assistance - Patient 50 - 74%    Lower Body Dressing/Undressing Lower body dressing      What is the patient wearing?: Pants     Lower body assist Assist for lower body dressing: Maximal Assistance - Patient 25 - 49%     Toileting Toileting Toileting Activity did not occur Landscape architect and hygiene only): Refused  Toileting assist Assist for toileting: Maximal Assistance - Patient 25 - 49%     Transfers Chair/bed transfer  Transfers assist     Chair/bed transfer assist level: Minimal Assistance - Patient > 75%     Locomotion Ambulation   Ambulation assist   Ambulation activity did not occur: Safety/medical concerns          Walk 10 feet activity   Assist  Walk 10 feet activity did not occur: Refused        Walk 50 feet activity   Assist Walk 50 feet with 2 turns activity did not occur: Safety/medical concerns         Walk 150 feet activity   Assist Walk 150 feet activity did not occur: Safety/medical concerns         Walk 10 feet  on uneven surface  activity   Assist Walk 10 feet on uneven surfaces activity did not occur: Safety/medical concerns         Wheelchair     Assist   Type of Wheelchair: Manual Wheelchair activity did not occur: Refused  Wheelchair assist level: Supervision/Verbal cueing Max wheelchair distance: 10'    Wheelchair 50 feet with 2 turns activity    Assist    Wheelchair 50 feet with 2 turns activity did not occur: Refused       Wheelchair 150 feet activity     Assist Wheelchair 150 feet activity did not occur: Safety/medical concerns        Medical Problem List  and Plan: 1.Debility and metabolic encephalopathysecondary to acute on chronic respiratory failure status post tracheostomy 11/19/2018.Pulmonary services to follow-up on plan decannulation continue CIR PT, OT,SLP   -will adjust therapies to 15/7. Have spoken to patient about participation/lack thereof 2.  DVT prophyllaxis Subcutaneous heparin.no renal failure---now on Lovenox 3. Pain Management:Tylenol as needed 4. Mood/bipolar disorder:Prozac 40 mg daily for mood stabilization, Klonopin 0.5 mg 3 times a day as needed for anxiety or agitation -fair sleep patterns 5. Neuropsych: This patientis notcapable of making decisions on herown behalf. 6. Skin/Wound Care:Routine skin checks 7. Fluids/Electrolytes/Nutrition:encourage PO  8. Chronic hypotension. Patient had been on ProAmatine 10 mg 3 times a day as well as Florinef prior to admission.Resume as neededpendng tolerance of therapy Vitals:   12/11/18 0829 12/11/18 0830  BP:    Pulse:  81  Resp:  19  Temp:    SpO2: 95% 95%  reasonable control at present 9.COPD with history of tobacco abuse. Patient on 2 L oxygen at home.Continue chronic prednisone. Follow-up pulmonary services Dr.Wert.Continue nebulizersas directed  -now with #4 cuffless  - continue PMV/plugging trials.   -potential decannulation Monday depending upon her tolerance  10. Seizure disorder. ContinueDepakoteas prior to admission.. EEG negative No clinical seizure activity 11.Hypothyroidism. Synthroid 12. Dysphagia. Diet upgraded to D3 thins, tolerating well and eating more 13. Polysubstance abuse. UDS positive for cocaine. Provide counseling 14. History of breast cancer with lumpectomy. Patient on Arimidex 1 mg daily prior to admission.OK to restart per Dr Burr Medico Heme/Onc also Dr Burr Medico wants pt to f/u since she hasn't been seen since 2018  15.  Leukocytosis with chronic steroid use, afeb 16.  Medication compliance an ongoing issue. Daily  ed by staff.    ELOS: 14 days A FACE TO FACE EVALUATION WAS PERFORMED  Meredith Staggers 12/11/2018, 9:49 AM

## 2018-12-12 ENCOUNTER — Inpatient Hospital Stay (HOSPITAL_COMMUNITY): Payer: Medicaid Other

## 2018-12-12 ENCOUNTER — Inpatient Hospital Stay (HOSPITAL_COMMUNITY): Payer: Medicaid Other | Admitting: Physical Therapy

## 2018-12-12 ENCOUNTER — Inpatient Hospital Stay (HOSPITAL_COMMUNITY): Payer: Medicaid Other | Admitting: Occupational Therapy

## 2018-12-12 DIAGNOSIS — D62 Acute posthemorrhagic anemia: Secondary | ICD-10-CM

## 2018-12-12 DIAGNOSIS — D72829 Elevated white blood cell count, unspecified: Secondary | ICD-10-CM

## 2018-12-12 DIAGNOSIS — R7309 Other abnormal glucose: Secondary | ICD-10-CM

## 2018-12-12 DIAGNOSIS — I950 Idiopathic hypotension: Secondary | ICD-10-CM

## 2018-12-12 DIAGNOSIS — R0902 Hypoxemia: Secondary | ICD-10-CM

## 2018-12-12 DIAGNOSIS — D72828 Other elevated white blood cell count: Secondary | ICD-10-CM

## 2018-12-12 DIAGNOSIS — R569 Unspecified convulsions: Secondary | ICD-10-CM

## 2018-12-12 DIAGNOSIS — J9601 Acute respiratory failure with hypoxia: Secondary | ICD-10-CM

## 2018-12-12 LAB — GLUCOSE, CAPILLARY
GLUCOSE-CAPILLARY: 113 mg/dL — AB (ref 70–99)
Glucose-Capillary: 85 mg/dL (ref 70–99)
Glucose-Capillary: 119 mg/dL — ABNORMAL HIGH (ref 70–99)
Glucose-Capillary: 132 mg/dL — ABNORMAL HIGH (ref 70–99)

## 2018-12-12 NOTE — Progress Notes (Signed)
Occupational Therapy Session Note  Patient Details  Name: Courtney Terry MRN: [id number] Date of Birth: 1960-11-23  Today's Date: 12/12/2018 OT Individual Time: 7711-6579 OT Individual Time Calculation (min): 42 min    Short Term Goals: Week 1:  OT Short Term Goal 1 (Week 1): Pt will thread 1 LE into pants with supervision  OT Short Term Goal 1 - Progress (Week 1): Not met OT Short Term Goal 2 (Week 1): Pt will complete sit<stand for bathing with Min A and LRAD  OT Short Term Goal 2 - Progress (Week 1): Not met OT Short Term Goal 3 (Week 1): Pt will complete toilet transfer with Min A and LRAD OT Short Term Goal 3 - Progress (Week 1): Not met  Skilled Therapeutic Interventions/Progress Updates:    Patient seated in w/c upon arrival, eating with aide, completed full meal S level after set up - tolerated PMV t/o session.  Completed standing trials at bed side x4 for 30-45 seconds each, CG A.  SPT w/c to bed CGA, bed mobility with CS.  Completed unsupported sitting UE stretch/OH exercises with fair tolerance.  Completed UE ROM activities in supine position.  Patient remained in bed at close of session with bed alarm set, PMV removed and call bell in reach.    Therapy Documentation Precautions:  Precautions Precautions: Fall Precaution Comments: Trach collar, supplemental 02 Restrictions Weight Bearing Restrictions: No General:   Vital Signs: Therapy Vitals Pulse Rate: 82 Resp: 20 Patient Position (if appropriate): Lying Oxygen Therapy SpO2: 95 % O2 Device: Tracheostomy Collar O2 Flow Rate (L/min): 5 L/min FiO2 (%): 28 % Pain: Pain Assessment Pain Scale: 0-10 Pain Score: 9  Pain Location: Buttocks Pain Descriptors / Indicators: Sharp Pain Intervention(s): Repositioned   Other Treatments:     Therapy/Group: Individual Therapy  Carlos Levering 12/12/2018, 1:38 PM

## 2018-12-12 NOTE — Progress Notes (Signed)
Speech Language Pathology Daily Session Note  Patient Details  Name: Courtney Terry MRN: [id number] Date of Birth: 1961-03-03  Today's Date: 12/12/2018 SLP Individual Time: 0900-0930 SLP Individual Time Calculation (min): 30 min  Short Term Goals: Week 3: SLP Short Term Goal 1 (Week 3): Patient will consume dysphagia 3 with thin liquids with minimal overt s/s of aspiration and Min A verbal cues for use of swallowing compensatory strategies.  SLP Short Term Goal 2 (Week 3): Pt will demonstrate selective attention to tasks for 30 minutes in midlly distracting environment with Min A cues.  SLP Short Term Goal 3 (Week 3): Given Mod A cues, pt will increase vocal intensity to achieve ~ 75% intelligibility at the phrase level in 8 out of 10 opportunities.   SLP Short Term Goal 4 (Week 3): Pt will complete basic problem solving tasks with Mod A cues.  SLP Short Term Goal 5 (Week 3): Pt will donn and doff PMSV with Min A cues in 8 out of 10 opportunities.   Skilled Therapeutic Interventions:Skilled ST services focused on swallow and speech skills. Pt had PMSV in place ~10 minutes piror to session with nurse, Margaretha Sheffield, present. Pt demonstrated PMSV tolerance total of 40 minutes, no air trapping noted (assess x4) and no personal s/s of distress, however monitor appeared faulty (likely due finger sensor, SLP request nurse to change finger sensor) with fluxuating HR and O2 levels with and without PMSV in place Pt requested regular snack, peanut butter and graham crackers, SLP facilitated PO consumption of regular trial, pt demonstrated appropriate oral clearances and no overt s/s aspiration. SLP facilitated speech intelligibility strategies at phrase level utilizing picture description task, pt required supervision A verbal for increased vocal intensity to achieve 75% intelligibility. SLP attempted to educate and facilitate donning/off and on PMSV, however pt refused. Pt requested to return to bed, required mod A  verbal cues for impulsivity, however required min A during transfer. Pt was left in room with call bell within reach and bed alarm set. SLP reccomends to continue skilled services.       Pain Pain Assessment Pain Scale: 0-10 Pain Score: 0-No pain Pain Location: Buttocks Pain Descriptors / Indicators: Sharp Pain Intervention(s): Repositioned  Therapy/Group: Individual Therapy  Derika Eckles  [facility] 12/12/2018, 3:26 PM

## 2018-12-12 NOTE — Progress Notes (Signed)
[place] PHYSICAL MEDICINE & REHABILITATION PROGRESS NOTE   Subjective/Complaints: Patient seen laying in bed this morning.  She states she slept well overnight.  She appears to deny complaints.  ROS: Appears to deny CP, SOB, nausea, vomiting, diarrhea.  Objective:   No results found. Recent Labs    12/10/18 0530  WBC 11.1*  HGB 11.1*  HCT 36.7  PLT 430*   Recent Labs    12/10/18 0530  NA 140  K 4.5  CL 100  CO2 31  GLUCOSE 84  BUN 13  CREATININE 0.32*  CALCIUM 9.2    Intake/Output Summary (Last 24 hours) at 12/12/2018 0831 Last data filed at 12/12/2018 0031 Gross per 24 hour  Intake 282 ml  Output -  Net 282 ml     Physical Exam: Vital Signs Blood pressure 104/70, pulse 80, temperature 98.4 F (36.9 C), temperature source Tympanic, resp. rate 16, height 5\' 10"  (1.778 m), weight 64.4 kg, SpO2 97 %. Constitutional: No distress . Vital signs reviewed.  Cachectic. HENT: Normocephalic.  Atraumatic. Neck: #4 Shiley Eyes: EOMI. No discharge. Cardiovascular: RRR. No JVD. Respiratory: CTA Bilaterally. Normal effort. GI: BS +. Non-distended. Musc: No edema or tenderness in extremities. Skin: No evidence of breakdown, no evidence of rash Neurologic: alert Motor: Grossly 4/5 throughout   Assessment/Plan: 1. Functional deficits secondary to debilitiy from respiratory failure which require interdisciplinary therapy in a comprehensive inpatient rehab setting.  Physiatrist is providing close team supervision and 24 hour management of active medical problems listed below.  Physiatrist and rehab team continue to assess barriers to discharge/monitor patient progress toward functional and medical goals  Care Tool:  Bathing  Bathing activity did not occur: Refused Body parts bathed by patient: Right arm, Left arm, Chest, Abdomen, Right upper leg, Left upper leg, Face   Body parts bathed by helper: Front perineal area, Buttocks, Left lower leg, Right lower leg      Bathing assist Assist Level: Moderate Assistance - Patient 50 - 74%     Upper Body Dressing/Undressing Upper body dressing   What is the patient wearing?: Pull over shirt    Upper body assist Assist Level: Moderate Assistance - Patient 50 - 74%    Lower Body Dressing/Undressing Lower body dressing      What is the patient wearing?: Pants     Lower body assist Assist for lower body dressing: Maximal Assistance - Patient 25 - 49%     Toileting Toileting Toileting Activity did not occur Landscape architect and hygiene only): Refused  Toileting assist Assist for toileting: Maximal Assistance - Patient 25 - 49%     Transfers Chair/bed transfer  Transfers assist     Chair/bed transfer assist level: Minimal Assistance - Patient > 75%     Locomotion Ambulation   Ambulation assist   Ambulation activity did not occur: Safety/medical concerns          Walk 10 feet activity   Assist  Walk 10 feet activity did not occur: Refused        Walk 50 feet activity   Assist Walk 50 feet with 2 turns activity did not occur: Safety/medical concerns         Walk 150 feet activity   Assist Walk 150 feet activity did not occur: Safety/medical concerns         Walk 10 feet on uneven surface  activity   Assist Walk 10 feet on uneven surfaces activity did not occur: Safety/medical concerns  Wheelchair     Assist   Type of Wheelchair: Educational psychologist activity did not occur: Refused  Wheelchair assist level: Supervision/Verbal cueing Max wheelchair distance: 150'    Wheelchair 50 feet with 2 turns activity    Assist    Wheelchair 50 feet with 2 turns activity did not occur: Refused   Assist Level: Supervision/Verbal cueing   Wheelchair 150 feet activity     Assist Wheelchair 150 feet activity did not occur: Safety/medical concerns   Assist Level: Supervision/Verbal cueing    Medical Problem List and  Plan: 1.Debility and metabolic encephalopathysecondary to acute on chronic respiratory failure status post tracheostomy 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  DVT prophyllaxis Subcutaneous Lovenox 3. Pain Management:Tylenol as needed 4. Mood/bipolar disorder:Prozac 40 mg daily for mood stabilization, Klonopin 0.5 mg 3 times a day as needed for anxiety or agitation -fair sleep patterns 5. Neuropsych: This patientis notcapable of making decisions on herown behalf. 6. Skin/Wound Care:Routine skin checks 7. Fluids/Electrolytes/Nutrition:encourage PO  8. Chronic hypotension. Patient had been on ProAmatine 10 mg 3 times a day as well as Florinef prior to admission.Resume as neededpendng tolerance of therapy Vitals:   12/12/18 0015 12/12/18 0520  BP:  104/70  Pulse:  80  Resp:  16  Temp:  98.4 F (36.9 C)  SpO2: 94% 97%   Borderline low, however asymptomatic on 3/7 9.COPD with history of tobacco abuse. Patient on 2 L oxygen at home.Continue chronic prednisone. Follow-up pulmonary services Dr.Wert.Continue nebulizersas directed  -now with #4 cuffless  - continue PMV/plugging trials.   -potential decannulation Monday depending upon her tolerance  10. Seizure disorder. ContinueDepakoteas prior to admission.. EEG negative No clinical seizure activity 11.Hypothyroidism. Synthroid 12. Dysphagia. Diet upgraded to D3 thins, tolerating well and eating more 13. Polysubstance abuse. UDS positive for cocaine. Provide counseling 14. History of breast cancer with lumpectomy. Patient on Arimidex 1 mg daily prior to admission.OK to restart per Dr Burr Medico Heme/Onc also Dr Burr Medico wants pt to f/u since she hasn't been seen since 2018 15.  Leukocytosis with chronic steroid use  Afebrile  WBCs 11.1 on 3/5 16.  Medication compliance an ongoing issue. Daily ed by staff.  17. Labile blood  glucose  Labile on 3/7  Continue to monitor for trend 18.  Acute blood loss anemia  Hemoglobin 11.1 on 3/5  Continue to monitor  ELOS: 15 days A FACE TO FACE EVALUATION WAS PERFORMED  Austine Wiedeman Lorie Phenix 12/12/2018, 8:31 AM

## 2018-12-12 NOTE — Progress Notes (Signed)
Physical Therapy Session Note  Patient Details  Name: Courtney Terry MRN: [id number] Date of Birth: 06-14-1961  Today's Date: 12/12/2018 PT Individual Time: 1430-1500 PT Individual Time Calculation (min): 30 min   Short Term Goals: Week 2:  PT Short Term Goal 1 (Week 2): =LTG due to ELOS  Skilled Therapeutic Interventions/Progress Updates:    Pt received sidelying in bed. Pt complaining of buttock pain, rated 9/10, but declines intervention. Pt resistant to participation in therapy this PM. Reminded pt that yesterday she reported she wanted to work on gait today, she declines 2/2 to pain but reports she is agreeable to try again in 30 min. Pt requesting a snack. Pt dons PMSV with therapist. Pt is setup A with max cueing for safety to eat chips while seated in bed. Pt requires several instances of therapist intervention for improved seated positioning and for smaller bites due to impulsivity and poor awareness and regard for body positioning. Education with patient that she will need to be seated in her w/c for future meals due to poor body mechanics and poor ability to maintain upright posture while seated in bed. Encouraged pt to perform OOB mobility with therapist. Pt reports she stood up with earlier therapy session and refuses OOB mobility this PM. Pt missed 30 min of scheduled therapy session due to pain in buttocks from wound and due to refusal to participate in further functional activity. Education with patient about skin breakdown and that she could avoid further wounds by spending more time out of bed, pt not receptive to education. Pt left seated in bed with needs in reach, PMSV removed at end of session.  Therapy Documentation Precautions:  Precautions Precautions: Fall Precaution Comments: Trach collar, supplemental 02 Restrictions Weight Bearing Restrictions: No General: PT Amount of Missed Time (min): 30 Minutes PT Missed Treatment Reason: Pain;Patient unwilling to  participate    Therapy/Group: Individual Therapy  Excell Seltzer, PT, DPT  12/12/2018, 3:17 PM

## 2018-12-13 ENCOUNTER — Inpatient Hospital Stay (HOSPITAL_COMMUNITY): Payer: Medicaid Other | Admitting: Physical Therapy

## 2018-12-13 ENCOUNTER — Inpatient Hospital Stay (HOSPITAL_COMMUNITY): Payer: Medicaid Other | Admitting: Occupational Therapy

## 2018-12-13 DIAGNOSIS — F3170 Bipolar disorder, currently in remission, most recent episode unspecified: Secondary | ICD-10-CM

## 2018-12-13 LAB — GLUCOSE, CAPILLARY
Glucose-Capillary: 73 mg/dL (ref 70–99)
Glucose-Capillary: 119 mg/dL — ABNORMAL HIGH (ref 70–99)
Glucose-Capillary: 188 mg/dL — ABNORMAL HIGH (ref 70–99)
Glucose-Capillary: 88 mg/dL (ref 70–99)

## 2018-12-13 NOTE — Progress Notes (Addendum)
Occupational Therapy Weekly Progress Note  Patient Details  Name: Courtney Terry MRN: [id number] Date of Birth: 1961/07/25  Beginning of progress report period: 12/05/18 End of progress report period: 12/13/18  Today's Date: 12/13/2018 OT Individual Time: 1030-1047 OT Individual Time Calculation (min): 17 min  58 minutes missed   STGs were not set during this report period due to ELOS. She is currently awaiting SNF placement for further rehabilitative services. Pt continues to exhibit self limiting behaviors, often refuses OOB treatment and overall OT participation. Pt is currently Min A for stand pivot transfers. Education regarding benefits of OT is ongoing, encouragement to engage in tx will continue to be provided. Continue POC.     Patient continues to demonstrate the following deficits: muscle weakness, decreased cardiorespiratoy endurance and decreased oxygen support, decreased awareness and decreased safety awareness and decreased standing balance and decreased balance strategies and therefore will continue to benefit from skilled OT intervention to enhance overall performance with BADL.  Patient progressing toward long term goals..  Continue plan of care.  OT Short Term Goals Week 2:  OT Short Term Goal 1 (Week 2): STGs=LTGs due to ELOS OT Short Term Goal 1 - Progress (Week 2): Not met Week 3:  OT Short Term Goal 1 (Week 3): Pt will complete 1 grooming task while sitting or standing at sink to increase OOB tolerance  OT Short Term Goal 2 (Week 3): Pt will complete BSC or toilet transfer with supervision   Skilled Therapeutic Interventions/Progress Updates:    Pt greeted in bed with Courtney Terry present. Pt mouthing "it's too early" and gesturing for OT to leave. Provided encouragement to get OOB to complete self care or other grooming tasks, however pt appearing agitated and waving arm for OT to go. Returned an hour later, and pt verbalized she wanted to drink a sprite. PMSV donned by OT. Pt  transitioned to EOB and then completed stand pivot<w/c with supervision. She drank diet sprite with supervision. Pt then requested for regular chocolate ice cream and graham crackers. Consulted RN regarding her requests, with RN verbalizing that per pt's behavior plan, she cannot eat in between meals, and also that stated items were not allowed given her modified diet. Per RN, pt has been told this. OT reeducated pt, and she was visibly agitated. Encouraged bathing or grooming task completion at sink, however pt refused, adamant that she wanted to return to bed. Supervision for stand pivot back to bed. PMSV doffed. Pt left with all needs within reach and bed alarm set.   02 sats via trach collar 92-98% during session.    OT returned in the afternoon to make up therapy time from this morning. Pt in bed and refused to get OOB for tx or engage in any therapeutic activity offered. 58 minutes missed due to pt refusal to participate.   Therapy Documentation Precautions:  Precautions Precautions: Fall Precaution Comments: Trach collar, supplemental 02 Restrictions Weight Bearing Restrictions: No Vital Signs: Therapy Vitals Pulse Rate: 75 Resp: 20 Patient Position (if appropriate): Sitting Oxygen Therapy SpO2: 97 % O2 Device: Tracheostomy Collar O2 Flow Rate (L/min): 5 L/min FiO2 (%): 28 % Pain: Pain Assessment Pain Scale: 0-10 Pain Score: 0-No pain ADL: ADL Eating: Not assessed Grooming: Supervision/safety Where Assessed-Grooming: Edge of bed Upper Body Bathing: Minimal assistance(for thoroughness) Where Assessed-Upper Body Bathing: Edge of bed Lower Body Bathing: Maximal assistance Where Assessed-Lower Body Bathing: Bed level, Edge of bed Upper Body Dressing: Maximal assistance Where Assessed-Upper Body Dressing: Edge of bed  Lower Body Dressing: Maximal assistance Where Assessed-Lower Body Dressing: Edge of bed Toileting: Not assessed Toilet Transfer: Moderate assistance Toilet  Transfer Method: Stand pivot, Squat pivot Toilet Transfer Equipment: Bedside commode, Drop arm bedside commode Tub/Shower Transfer: Not assessed    Therapy/Group: Individual Therapy  Courtney Terry A Courtney Terry 12/13/2018, 12:35 PM

## 2018-12-13 NOTE — Progress Notes (Signed)
[facility] PHYSICAL MEDICINE & REHABILITATION PROGRESS NOTE   Subjective/Complaints: Patient seen laying in bed this morning.  She states she slept well overnight.  She denies complaints.  ROS: Denies CP, SOB, N/V/D  Objective:   No results found. No results for input(s): WBC, HGB, HCT, PLT in the last 72 hours. No results for input(s): NA, K, CL, CO2, GLUCOSE, BUN, CREATININE, CALCIUM in the last 72 hours.  Intake/Output Summary (Last 24 hours) at 12/13/2018 1616 Last data filed at 12/13/2018 1251 Gross per 24 hour  Intake 462 ml  Output -  Net 462 ml     Physical Exam: Vital Signs Blood pressure 90/60, pulse 79, temperature 98.5 F (36.9 C), resp. rate 18, height 5\' 10"  (1.778 m), weight 64.4 kg, SpO2 98 %. Constitutional: No distress . Vital signs reviewed.  Cachectic. HENT: Normocephalic.  Atraumatic. Neck: #4 Shiley Eyes: EOMI. No discharge. Cardiovascular: RRR.  No JVD. Respiratory: CTA bilaterally.  Normal effort. GI: BS +. Non-distended. Musc: No edema or tenderness in extremities. Skin: No evidence of breakdown, no evidence of rash Neurologic: Alert Motor: Grossly 4/5 throughout, unchanged   Assessment/Plan: 1. Functional deficits secondary to debilitiy from respiratory failure which require interdisciplinary therapy in a comprehensive inpatient rehab setting.  Physiatrist is providing close team supervision and 24 hour management of active medical problems listed below.  Physiatrist and rehab team continue to assess barriers to discharge/monitor patient progress toward functional and medical goals  Care Tool:  Bathing  Bathing activity did not occur: Refused Body parts bathed by patient: Right arm, Left arm, Chest, Abdomen, Right upper leg, Left upper leg, Face   Body parts bathed by helper: Front perineal area, Buttocks, Left lower leg, Right lower leg     Bathing assist Assist Level: Moderate Assistance - Patient 50 - 74%     Upper Body  Dressing/Undressing Upper body dressing   What is the patient wearing?: Pull over shirt    Upper body assist Assist Level: Moderate Assistance - Patient 50 - 74%    Lower Body Dressing/Undressing Lower body dressing      What is the patient wearing?: Pants     Lower body assist Assist for lower body dressing: Maximal Assistance - Patient 25 - 49%     Toileting Toileting Toileting Activity did not occur Landscape architect and hygiene only): Refused  Toileting assist Assist for toileting: Maximal Assistance - Patient 25 - 49%     Transfers Chair/bed transfer  Transfers assist     Chair/bed transfer assist level: Minimal Assistance - Patient > 75%     Locomotion Ambulation   Ambulation assist   Ambulation activity did not occur: Safety/medical concerns          Walk 10 feet activity   Assist  Walk 10 feet activity did not occur: Refused        Walk 50 feet activity   Assist Walk 50 feet with 2 turns activity did not occur: Safety/medical concerns         Walk 150 feet activity   Assist Walk 150 feet activity did not occur: Safety/medical concerns         Walk 10 feet on uneven surface  activity   Assist Walk 10 feet on uneven surfaces activity did not occur: Safety/medical concerns         Wheelchair     Assist   Type of Wheelchair: Manual Wheelchair activity did not occur: Refused  Wheelchair assist level: Supervision/Verbal cueing Max wheelchair distance:  150'    Wheelchair 50 feet with 2 turns activity    Assist    Wheelchair 50 feet with 2 turns activity did not occur: Refused   Assist Level: Supervision/Verbal cueing   Wheelchair 150 feet activity     Assist Wheelchair 150 feet activity did not occur: Safety/medical concerns   Assist Level: Supervision/Verbal cueing    Medical Problem List and Plan: 1.Debility and metabolic encephalopathysecondary to acute on chronic respiratory failure status  post tracheostomy 11/19/2018.Pulmonary services to follow-up on plan decannulation  Continue CIR at 15/7 2.  DVT prophyllaxis Subcutaneous Lovenox 3. Pain Management:Tylenol as needed 4. Mood/bipolar disorder:Prozac 40 mg daily for mood stabilization, Klonopin 0.5 mg 3 times a day as needed for anxiety or agitation -fair sleep patterns 5. Neuropsych: This patientis notcapable of making decisions on herown behalf. 6. Skin/Wound Care:Routine skin checks 7. Fluids/Electrolytes/Nutrition:encourage PO  8. Chronic hypotension. Patient had been on ProAmatine 10 mg 3 times a day as well as Florinef prior to admission. Resume as neededpendng tolerance of therapy Vitals:   12/13/18 1510 12/13/18 1511  BP:  90/60  Pulse: 78 79  Resp: 20 18  Temp:  98.5 F (36.9 C)  SpO2: 96% 98%   Asymptomatic hypotension on 3/8 9.COPD with history of tobacco abuse. Patient on 2 L oxygen at home.Continue chronic prednisone. Follow-up pulmonary services Dr.Wert.Continue nebulizersas directed  -now with #4 cuffless  - continue PMV/plugging trials.   -potential decannulation Monday depending upon her tolerance  10. Seizure disorder. ContinueDepakoteas prior to admission.. EEG negative No clinical seizure activity 11.Hypothyroidism. Synthroid 12. Dysphagia. Diet upgraded to D3 thins, tolerating well and eating more 13. Polysubstance abuse. UDS positive for cocaine. Provide counseling 14. History of breast cancer with lumpectomy. Patient on Arimidex 1 mg daily prior to admission.OK to restart per Dr Burr Medico Heme/Onc also Dr Burr Medico wants pt to f/u since she hasn't been seen since 2018 15.  Leukocytosis with chronic steroid use  Afebrile  WBCs 11.1 on 3/5 16.  Medication compliance an ongoing issue. Daily ed by staff.  17. Labile blood glucose  Relatively controlled on 3/8  Continue to monitor for trend 18.  Acute blood loss anemia  Hemoglobin 11.1 on 3/5  Continue to monitor  ELOS: 16  days A FACE TO FACE EVALUATION WAS PERFORMED  Kedar Sedano Lorie Phenix 12/13/2018, 4:16 PM

## 2018-12-13 NOTE — Progress Notes (Addendum)
Physical Therapy Session Note  Patient Details  Name: Courtney Terry MRN: [id number] Date of Birth: 1961/04/11  Today's Date: 12/13/2018 PT Missed Time: 90 Minutes Missed Time Reason: Pain;Patient unwilling to participate  Pt in supine and nodding "no" when this therapist introduced herself. Donned PMSV and pt able to verbalize that she cannot participate this afternoon 2/2 pain and overall fatigue. Pt states her buttocks/sacral wounds are hurting, however declines getting OOB for pressure relief. Educated on importance of OOB activity and functional therapy participation, boyfriend present providing appropriate encouragement to participate as well. Pt continued to decline. PMSV removed prior to exiting room. Missed 60 min of skilled PT.  Elba Schaber Clent Demark 12/13/2018, 3:13 PM

## 2018-12-14 ENCOUNTER — Inpatient Hospital Stay (HOSPITAL_COMMUNITY): Payer: Medicaid Other | Admitting: Physical Therapy

## 2018-12-14 ENCOUNTER — Inpatient Hospital Stay (HOSPITAL_COMMUNITY): Payer: Medicaid Other | Admitting: Speech Pathology

## 2018-12-14 ENCOUNTER — Inpatient Hospital Stay: Payer: Medicaid Other | Admitting: Hematology

## 2018-12-14 ENCOUNTER — Inpatient Hospital Stay (HOSPITAL_COMMUNITY): Payer: Medicaid Other | Admitting: Occupational Therapy

## 2018-12-14 LAB — BASIC METABOLIC PANEL
ANION GAP: 9 (ref 5–15)
BUN: 14 mg/dL (ref 6–20)
CHLORIDE: 100 mmol/L (ref 98–111)
CO2: 30 mmol/L (ref 22–32)
Calcium: 9 mg/dL (ref 8.9–10.3)
Creatinine, Ser: 0.37 mg/dL — ABNORMAL LOW (ref 0.44–1.00)
GFR calc Af Amer: >60 mL/min (ref >60)
GFR calc non Af Amer: >60 mL/min (ref >60)
GLUCOSE: 86 mg/dL (ref 70–99)
Potassium: 4.1 mmol/L (ref 3.5–5.1)
Sodium: 139 mmol/L (ref 135–145)

## 2018-12-14 LAB — CBC
HEMATOCRIT: 35.9 % — AB (ref 36.0–46.0)
Hemoglobin: 11 g/dL — ABNORMAL LOW (ref 12.0–15.0)
MCH: 27.9 pg (ref 26.0–34.0)
MCHC: 30.6 g/dL (ref 30.0–36.0)
MCV: 91.1 fL (ref 80.0–100.0)
Platelets: 353 10*3/uL (ref 150–400)
RBC: 3.94 MIL/uL (ref 3.87–5.11)
RDW: 14.9 % (ref 11.5–15.5)
WBC: 8.4 10*3/uL (ref 4.0–10.5)
nRBC: 0 % (ref 0.0–0.2)

## 2018-12-14 LAB — GLUCOSE, CAPILLARY
Glucose-Capillary: 80 mg/dL (ref 70–99)
Glucose-Capillary: 82 mg/dL (ref 70–99)
Glucose-Capillary: 103 mg/dL — ABNORMAL HIGH (ref 70–99)
Glucose-Capillary: 98 mg/dL (ref 70–99)

## 2018-12-14 MED ORDER — ENOXAPARIN SODIUM 40 MG/0.4ML ~~LOC~~ SOLN
40.0000 mg | SUBCUTANEOUS | Status: DC
  Administered 2018-12-14 – 2018-12-21 (×3): 40 mg via SUBCUTANEOUS
  Filled 2018-12-14 (×4): qty 0.4

## 2018-12-14 NOTE — Progress Notes (Signed)
CrCl>30 ml/min. Adjusting lovenox to 40mg  qday.  Onnie Boer, PharmD, BCIDP, AAHIVP, CPP Infectious Disease Pharmacist 12/14/2018 7:59 AM

## 2018-12-14 NOTE — Progress Notes (Addendum)
NAME:  Courtney Terry, MRN:  [id number], DOB:  [birth year], LOS: 65 ADMISSION DATE:  11/27/2018, CONSULTATION DATE:  11/08/18 REFERRING MD:  Karmen Bongo, MD, CHIEF COMPLAINT:  Respiratory failure    Brief History   58 year old female with COPD (FEV1 25%) and chronic hypoxemic respiratory failure. Admitted 2/2-2/21 with acute on chronic hypoxemic and hypercarbic respiratory failure requiring intubation. UDS positive for cocaine. Tracheostomy placement on 2/13. 2/21 Admitted to [place].  PCCM asked to consult 2/27 for tracheostomy care.   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 HITT negative.  2/16 Sedate, gtt's being weaned off, on ATC during day but then required MV for resp distress 2/21 IP Rehab  3/4: Downsized to size 4 cuffless tracheostomy 3/9 increasing PMV goals to 24/7.  Consults:  PCCM   Procedures:  ETT 2/2 >> 2/13 Trach 2/13 >> R PICC 2/13 > 2/26   Significant Diagnostic Tests:  CTA 2/2: Neg for PE, emphysema, multiple pulmonary nodules, ETT in place CT head w/o contrast 2/2: stable/normal EEG 2/3: moderate global encephalopathy,  No epileptiform activities  UDS 2/2 > cocaine  Micro Data:  Resp PCR 2/2 > negative  UC 2/2 > 100k corynebacterium  Sputum 2/2 > normal flora  BCx2 2/2 > negative  Cervical Swab 2/14 > trichomonas   Antimicrobials:  Azithromycin 2/2 > 2/6 Ceftriaxone 2/3 >2/7 Flagyl 2/14 >off  Interim history/subjective:  Doing well  Objective   Blood pressure 114/79, pulse 80, temperature 99 F (37.2 C), temperature source Oral, resp. rate 18, height 5\' 10"  (1.778 m), weight 64.4 kg, SpO2 99 %.    FiO2 (%):  [28 %] 28 %   Intake/Output Summary (Last 24 hours) at  12/14/2018 1202 Last data filed at 12/14/2018 0700 Gross per 24 hour  Intake 222 ml  Output no documentation  Net 222 ml   Filed Weights   12/10/18 0555 12/11/18 0420 12/12/18 0520  Weight: 75.3 kg 75.1 kg 64.4 kg   EXAM: General frail 58 year old aaf resting in bed HENT NCAT # 4 cuffless trach in place. Phonation weak but improving w/ PMV pulm clear no accessory use faint crackles bases Card rrr  abd not tender  gu voids  Neuro intact   Resolved Hospital Problem list     Assessment & Plan:   Acute on Chronic Hypoxemic and Hypercarbic Respiratory Failure in setting of severe COPD (FEV1 25%), s/p tracheostomy sedated Trach status  Discussion Tolerated downsizing to size 4 well.  Seems like she is making progress. I think we need to push her harder.   Plan Will push PMV as much as possible.  If tolerates this 24-7 over next 48 hrs will initiate capping trials   I remain hopeful we will be able to get her tracheostomy out in the near future Will see her again on 4/11 to assess progress  Erick Colace ACNP-BC [place] Pager # [phone] OR # [phone] if no answer  Attending Note:  58 year old female with extensive PMH of COPD who was trached after a COPD exacerbation.  On exam, patient is alert and interactive, moving all ext to command with decreased BS diffusely.  I reviewed CXR myself, trach is in a good position.  Discussed  with PCCM-NP.    COPD:  - BD  - No steroids  Respiratory failure:  - Monitor closely for airway protection.  Trach status:  - PMV 24/7 til Wednesday, if well tolerated then will cap on Wednesday and decannulate on Friday.  PCCM will see again on Wednesday.  Patient seen and examined, agree with above note.  I dictated the care and orders written for this patient under my direction.  Rush Farmer, [place]

## 2018-12-14 NOTE — Progress Notes (Signed)
Speech Language Pathology Daily Session Note  Patient Details  Name: Courtney Terry MRN: [id number] Date of Birth: 01/19/1961  Today's Date: 12/14/2018 SLP Individual Time: 1345-1410 SLP Individual Time Calculation (min): 25 min  Short Term Goals: Week 3: SLP Short Term Goal 1 (Week 3): Patient will consume dysphagia 3 with thin liquids with minimal overt s/s of aspiration and Min A verbal cues for use of swallowing compensatory strategies.  SLP Short Term Goal 2 (Week 3): Pt will demonstrate selective attention to tasks for 30 minutes in midlly distracting environment with Min A cues.  SLP Short Term Goal 3 (Week 3): Given Mod A cues, pt will increase vocal intensity to achieve ~ 75% intelligibility at the phrase level in 8 out of 10 opportunities.   SLP Short Term Goal 4 (Week 3): Pt will complete basic problem solving tasks with Mod A cues.  SLP Short Term Goal 5 (Week 3): Pt will donn and doff PMSV with Min A cues in 8 out of 10 opportunities.   Skilled Therapeutic Interventions: Skilled treatment session focused on speech goals. SLP facilitated session by providing a mirror and supervision level verbal cues for patient to donn/doff PMSV safely. SLP also donned the leash from the PMSV to her trach collar in order to minimize risk of losing the PMSV in bed. Patient's friend "Courtney Terry" was present and asking when patient can have a milky way. Educated on current goals and how all food will be provided by SLP. He verbalized understanding. Courtney Terry also asked if he could donn/doff PMSV, however, patient verbalized she did not feel comfortable with this. Patient educated to call RN or remove PMSV herself, she verbalized understanding. Throughout session, all vitals remained WFL. Patient left upright in bed with all needs within reach and alarm on. Continue with current plan of care.      Pain Pain Assessment Pain Score: 0-No pain  Therapy/Group: Individual Therapy  [name] 12/14/2018, 2:16  PM

## 2018-12-14 NOTE — Progress Notes (Signed)
Speech Language Pathology Daily Session Note  Patient Details  Name: Courtney Terry MRN: [id number] Date of Birth: 1961/04/21  Today's Date: 12/14/2018 SLP Individual Time: 1200-1300 SLP Individual Time Calculation (min): 60 min  Short Term Goals: Week 3: SLP Short Term Goal 1 (Week 3): Patient will consume dysphagia 3 with thin liquids with minimal overt s/s of aspiration and Min A verbal cues for use of swallowing compensatory strategies.  SLP Short Term Goal 2 (Week 3): Pt will demonstrate selective attention to tasks for 30 minutes in midlly distracting environment with Min A cues.  SLP Short Term Goal 3 (Week 3): Given Mod A cues, pt will increase vocal intensity to achieve ~ 75% intelligibility at the phrase level in 8 out of 10 opportunities.   SLP Short Term Goal 4 (Week 3): Pt will complete basic problem solving tasks with Mod A cues.  SLP Short Term Goal 5 (Week 3): Pt will donn and doff PMSV with Min A cues in 8 out of 10 opportunities.   Skilled Therapeutic Interventions:Skilled ST services focused on swallow and cognitive skills. Pt's PMSV in place upon entering and vitals remained stable throughout session. SLP facilitated re-evaluation of cognitive linguistic skills with formal assessment MOCA. version 7.1, pt scored 12 out 30 ( n=>26 ) with 1 point added for education. Pt demonstrates impairments in immediate/delayed recall impacting selective attention, basic problem solving and emergent awareness. Pt states awareness of changes, however states "  I want to go home," following education of reason for skilled placement following discharge. SLP facilitated PO consumption trials of regular textured snacks, upon pt request, pt demonstrated no overt s/s aspiration, however continues fast rate. SLP facilitated PO consumption of dys 3 and thin via straw, lunch tray, pt demonstrated ability to self-feed, navigating tray and required supervision A verbal cues to slow rate/clear oral cavity in  a distracting environment, with no overt s/s aspiration. Pt requested to return to bed, SLP assisted with transfer, pt appeared frustrated by SLP assistance, pt required supervision assist. Pt was left in room with call bell within reach, PMSV in place and bed alarm set. Recommend to continue skilled ST services.      Pain Pain Assessment Pain Score: 0-No pain  Therapy/Group: Individual Therapy  Janye Maynor  [facility] 12/14/2018, 4:30 PM

## 2018-12-14 NOTE — Progress Notes (Signed)
Physical Therapy Weekly Progress Note  Patient Details  Name: Courtney Terry MRN: [id number] Date of Birth: 08-Dec-1960  Beginning of progress report period: December 07, 2018 End of progress report period: December 14, 2018  Today's Date: 12/14/2018 PT Individual Time: 1430-1500 PT Individual Time Calculation (min): 30 min    STGs were not set during this report period due to ELOS. Patient is currently awaiting SNF placement for further rehabilitative services and will continue with current POC awaiting placement. Pt continues to exhibit self limiting behaviors, often refuses OOB treatment and overall participation in therapy sessions. Pt is currently CGA for stand pivot transfers, Supervision for w/c mobility, and CGA for short distance gait in // bars. Education with regards to purpose of therapies, importance of time spent out of bed, etc, will continue.  Patient continues to demonstrate the following deficits muscle weakness, decreased cardiorespiratoy endurance and decreased oxygen support and decreased sitting balance, decreased standing balance, decreased postural control and decreased balance strategies and therefore will continue to benefit from skilled PT intervention to increase functional independence with mobility.  Patient progressing toward long term goals..  Continue plan of care.  PT Short Term Goals Week 2:  PT Short Term Goal 1 (Week 2): =LTG due to ELOS PT Short Term Goal 1 - Progress (Week 2): Progressing toward goal Week 3:  PT Short Term Goal 1 (Week 3): = LTG due to ELOS  Skilled Therapeutic Interventions/Progress Updates:  Pt received sidelying in bed, agreeable with encouragement to participate in therapy session. Supine to sit with Supervision. Stand pivot transfer bed to w/c with CGA. Manual w/c propulsion x 100 ft with use of BUE and Supervision. Sit to stand with // bars with close SBA to CGA. Pt refuses assist from therapist and tells therapist not to assist her with  transfers or gait because she can perform these tasks independently. Education with patient that she has not been performing transfers consistently and has not performed gait during her rehab stay so it is safer for her to have therapist close by in case of LOB or a fall. Pt remains insistent that therapist not assist her. Ambulation 2 x 10 ft in // bars with close SBA to CGA. Pt is ataxic with gait and fatigues quickly, requires seated rest break between bouts. Pt intermittently uses BUE support from // bars, when only using one UE support has increase in ataxia and decreased balance noted. Pt exhibits poor safety awareness of her balance deficits. RT present at end of therapy session to provide breathing treatment for patient. Pt becomes impulsive and attempts to stand up from her unlocked w/c with leg rests in place to transfer herself back into the bed before therapist can set transfer up safely. Once transfer is set up pt is able to perform stand pivot transfer w/c to bed with CGA with near LOB onto the bed. Sit to supine Supervision. Pt left sidelying in bed with needs in reach in care of RT. Pt on 4-5 L O2 via trach collar throughout session, vitals WNL.  Therapy Documentation Precautions:  Precautions Precautions: Fall Precaution Comments: Trach collar, supplemental 02 Restrictions Weight Bearing Restrictions: No Pain: Pain Assessment Pain Score: 0-No pain   Therapy/Group: Individual Therapy  Excell Seltzer, PT, DPT  12/14/2018, 3:49 PM

## 2018-12-14 NOTE — Progress Notes (Signed)
Occupational Therapy Session Note  Patient Details  Name: Courtney Terry MRN: [id number] Date of Birth: 05/17/1961  Today's Date: 12/14/2018 OT Individual Time: 1015-1056 OT Individual Time Calculation (min): 41 min  and Today's Date: 12/14/2018 OT Missed Time: 34 Minutes Missed Time Reason: Patient fatigue;Patient unwilling/refused to participate without medical reason   Short Term Goals: Week 3:  OT Short Term Goal 1 (Week 3): Pt will complete 1 grooming task while sitting or standing at sink to increase OOB tolerance  OT Short Term Goal 2 (Week 3): Pt will complete BSC or toilet transfer with supervision   Skilled Therapeutic Interventions/Progress Updates:    Upon entering the room, pt supine in bed with no c/o pain. OT assisted pt with donning PMSV. Pt very tearful and verbalized, " I want to go home. I have been here 2 months." OT provided therapeutic use of self and encouraged pt to participate this session. Pt declined exiting the bed but was agreeable to B UE strengthening with use of level 1 theraband. Pt performed 2 sets of 10 lateral pull downs, chest pulls, shoulder flexion, and bicep curls with min verbal cuing for proper technique. Pt needing frequent rest breaks as well. O2 saturation remaining above 94% this session and HR remained between 85-95 bpm. Pt requesting snacks many times as well and educated on having meals only and when seated in wheelchair. Pt appearing upset and asking for sprite. Pt performed bed mobility with supervision to EOB and supervision while holding self up on EOB to drink. Pt returning to supine and repositioning self with therapist placing pillow between pt's knees for comfort in side lying position. Pt declined further OT intervention and call bell with all other needs within reach.   Therapy Documentation Precautions:  Precautions Precautions: Fall Precaution Comments: Trach collar, supplemental 02 Restrictions Weight Bearing Restrictions:  No General: General OT Amount of Missed Time: 34 Minutes Vital Signs: Therapy Vitals Pulse Rate: 80 Resp: 18 Patient Position (if appropriate): Lying Oxygen Therapy SpO2: 99 % O2 Device: Tracheostomy Collar O2 Flow Rate (L/min): 5 L/min FiO2 (%): 28 % Pain: Pain Assessment Pain Scale: 0-10 Pain Score: 0-No pain ADL: ADL Eating: Not assessed Grooming: Supervision/safety Where Assessed-Grooming: Edge of bed Upper Body Bathing: Minimal assistance(for thoroughness) Where Assessed-Upper Body Bathing: Edge of bed Lower Body Bathing: Maximal assistance Where Assessed-Lower Body Bathing: Bed level, Edge of bed Upper Body Dressing: Maximal assistance Where Assessed-Upper Body Dressing: Edge of bed Lower Body Dressing: Maximal assistance Where Assessed-Lower Body Dressing: Edge of bed Toileting: Not assessed Toilet Transfer: Moderate assistance Toilet Transfer Method: Stand pivot, Squat pivot Toilet Transfer Equipment: Bedside commode, Drop arm bedside commode Tub/Shower Transfer: Not assessed   Therapy/Group: Individual Therapy  Gypsy Decant 12/14/2018, 11:09 AM

## 2018-12-14 NOTE — Progress Notes (Signed)
[place] PHYSICAL MEDICINE & REHABILITATION PROGRESS NOTE   Subjective/Complaints: Pt in bed. No major issues over weekend. Inconsistent participation in program still. Slept well. Denies any breathing issues. Tolerating trach  ROS: Patient denies fever, rash, sore throat, blurred vision, nausea, vomiting, diarrhea, cough, shortness of breath or chest pain, joint or back pain, headache, or mood change.    Objective:   No results found. Recent Labs    12/14/18 0632  WBC 8.4  HGB 11.0*  HCT 35.9*  PLT 353   Recent Labs    12/14/18 0632  NA 139  K 4.1  CL 100  CO2 30  GLUCOSE 86  BUN 14  CREATININE 0.37*  CALCIUM 9.0    Intake/Output Summary (Last 24 hours) at 12/14/2018 1250 Last data filed at 12/14/2018 0700 Gross per 24 hour  Intake 222 ml  Output -  Net 222 ml     Physical Exam: Vital Signs Blood pressure 114/79, pulse 70, temperature 99 F (37.2 C), temperature source Oral, resp. rate 16, height 5\' 10"  (1.778 m), weight 64.4 kg, SpO2 99 %. Constitutional: No distress . Vital signs reviewed. HEENT: EOMI, oral membranes moist Neck: supple, #4 trach no PMV Cardiovascular: RRR without murmur. No JVD    Respiratory: CTA Bilaterally without wheezes or rales. Normal effort    GI: BS +, non-tender, non-distended  Musc: No edema or tenderness in extremities. Skin: No evidence of breakdown, no evidence of rash Neurologic: Alert Motor: Grossly 4/5 all 4 Psych: pleasant   Assessment/Plan: 1. Functional deficits secondary to debilitiy from respiratory failure which require interdisciplinary therapy in a comprehensive inpatient rehab setting.  Physiatrist is providing close team supervision and 24 hour management of active medical problems listed below.  Physiatrist and rehab team continue to assess barriers to discharge/monitor patient progress toward functional and medical goals  Care Tool:  Bathing  Bathing activity did not occur: Refused Body parts bathed  by patient: Right arm, Left arm, Chest, Abdomen, Right upper leg, Left upper leg, Face   Body parts bathed by helper: Front perineal area, Buttocks, Left lower leg, Right lower leg     Bathing assist Assist Level: Moderate Assistance - Patient 50 - 74%     Upper Body Dressing/Undressing Upper body dressing Upper body dressing/undressing activity did not occur (including orthotics): Refused What is the patient wearing?: Pull over shirt    Upper body assist Assist Level: Moderate Assistance - Patient 50 - 74%    Lower Body Dressing/Undressing Lower body dressing    Lower body dressing activity did not occur: Refused What is the patient wearing?: Pants     Lower body assist Assist for lower body dressing: Maximal Assistance - Patient 25 - 49%     Toileting Toileting Toileting Activity did not occur Landscape architect and hygiene only): Refused  Toileting assist Assist for toileting: Maximal Assistance - Patient 25 - 49%     Transfers Chair/bed transfer  Transfers assist     Chair/bed transfer assist level: Minimal Assistance - Patient > 75%     Locomotion Ambulation   Ambulation assist   Ambulation activity did not occur: Safety/medical concerns          Walk 10 feet activity   Assist  Walk 10 feet activity did not occur: Refused        Walk 50 feet activity   Assist Walk 50 feet with 2 turns activity did not occur: Safety/medical concerns         Walk 150  feet activity   Assist Walk 150 feet activity did not occur: Safety/medical concerns         Walk 10 feet on uneven surface  activity   Assist Walk 10 feet on uneven surfaces activity did not occur: Safety/medical concerns         Wheelchair     Assist   Type of Wheelchair: Manual Wheelchair activity did not occur: Refused  Wheelchair assist level: Supervision/Verbal cueing Max wheelchair distance: 150'    Wheelchair 50 feet with 2 turns activity    Assist     Wheelchair 50 feet with 2 turns activity did not occur: Refused   Assist Level: Supervision/Verbal cueing   Wheelchair 150 feet activity     Assist Wheelchair 150 feet activity did not occur: Safety/medical concerns   Assist Level: Supervision/Verbal cueing    Medical Problem List and Plan: 1.Debility and metabolic encephalopathysecondary to acute on chronic respiratory failure status post tracheostomy 11/19/2018.Pulmonary services to follow-up on plan decannulation  Continue CIR at 15/7 2.  DVT prophyllaxis Subcutaneous Lovenox 3. Pain Management:Tylenol as needed 4. Mood/bipolar disorder:Prozac 40 mg daily for mood stabilization, Klonopin 0.5 mg 3 times a day as needed for anxiety or agitation -fair sleep patterns 5. Neuropsych: This patientis notcapable of making decisions on herown behalf. 6. Skin/Wound Care:Routine skin checks 7. Fluids/Electrolytes/Nutrition:encourage PO  8. Chronic hypotension. Patient had been on ProAmatine 10 mg 3 times a day as well as Florinef prior to admission. Resume as neededpendng tolerance of therapy Vitals:   12/14/18 0730 12/14/18 1202  BP:    Pulse: 80 70  Resp: 18 16  Temp:    SpO2: 99% 99%   Asymptomatic hypotension on 3/9 9.COPD with history of tobacco abuse. Patient on 2 L oxygen at home.Continue chronic prednisone. Follow-up pulmonary services Dr.Wert.Continue nebulizersas directed  -now with #4 cuffless  - continue PMV/plugging trials per CCM.    10. Seizure disorder. ContinueDepakoteas prior to admission.. EEG negative No clinical seizure activity 11.Hypothyroidism. Synthroid 12. Dysphagia. Diet upgraded to D3 thins, tolerating well and eating more 13. Polysubstance abuse. UDS positive for cocaine. Provide counseling 14. History of breast cancer with lumpectomy. Patient on Arimidex 1 mg daily prior to admission.OK to restart per Dr Burr Medico Heme/Onc also Dr Burr Medico wants pt to f/u since she hasn't been  seen since 2018 15.  Leukocytosis with chronic steroid use  Afebrile  WBCs 8.4 3/9 16.  Medication compliance an ongoing issue. Daily ed by staff.  17. Labile blood glucose   controlled on 3/9  Continue to monitor for trend 18.  Acute blood loss anemia  Hemoglobin 11.0 3.9  Continue to monitor  ELOS: 17 days A FACE TO FACE EVALUATION WAS PERFORMED  Meredith Staggers 12/14/2018, 12:50 PM

## 2018-12-15 ENCOUNTER — Inpatient Hospital Stay (HOSPITAL_COMMUNITY): Payer: Medicaid Other | Admitting: Occupational Therapy

## 2018-12-15 ENCOUNTER — Inpatient Hospital Stay (HOSPITAL_COMMUNITY): Payer: Medicaid Other

## 2018-12-15 LAB — GLUCOSE, CAPILLARY
GLUCOSE-CAPILLARY: 89 mg/dL (ref 70–99)
GLUCOSE-CAPILLARY: 98 mg/dL (ref 70–99)
Glucose-Capillary: 89 mg/dL (ref 70–99)
Glucose-Capillary: 93 mg/dL (ref 70–99)

## 2018-12-15 MED ORDER — ENSURE ENLIVE PO LIQD
237.0000 mL | Freq: Three times a day (TID) | ORAL | Status: DC
  Administered 2018-12-15 – 2018-12-19 (×8): 237 mL via ORAL

## 2018-12-15 NOTE — Progress Notes (Signed)
Speech Language Pathology Daily Session Note  Patient Details  Name: Courtney Terry MRN: [id number] Date of Birth: 04/21/1961  Today's Date: 12/15/2018 SLP Individual Time: 4098-1191 SLP Individual Time Calculation (min): 42 min  Short Term Goals: Week 3: SLP Short Term Goal 1 (Week 3): Patient will consume dysphagia 3 with thin liquids with minimal overt s/s of aspiration and Min A verbal cues for use of swallowing compensatory strategies.  SLP Short Term Goal 2 (Week 3): Pt will demonstrate selective attention to tasks for 30 minutes in midlly distracting environment with Min A cues.  SLP Short Term Goal 3 (Week 3): Given Mod A cues, pt will increase vocal intensity to achieve ~ 75% intelligibility at the phrase level in 8 out of 10 opportunities.   SLP Short Term Goal 4 (Week 3): Pt will complete basic problem solving tasks with Mod A cues.  SLP Short Term Goal 5 (Week 3): Pt will donn and doff PMSV with Min A cues in 8 out of 10 opportunities.   Skilled Therapeutic Interventions:Skilled ST services focused on cognitive skills. Pt refused to return to [facility] following brief changing complete by NT, Janett Billow and SLP. SLP facilitated basic and familiar problem solving skills, utilizing money management (count change) pt required mod A verbal cues, however pt refused to continue task, requesting to lay down and that the "people in back jumpsuit" , respiratory. Pt stated difficulty breathing O2 95% and HR increased to 92, SLP notified nursing staff. Respiratory completed breathing treatment and suction. SLP facilitated basic problem solving skills, pt identified three differnces amoung two picture cards at a time, pt required mod A verbal cues and mod A verbal cues for recall of main idea in pictures with 2 minute delay following visualization instructions. Pt demonstarted ability to don off and on PMSV with extra time. SLP upgraded LTG in swallowing to supervision A and basic problem solving to min A. Pt  was left in room with call bell within reach and bed alarm set. SLP reccomends to continue skilled services.      Pain Pain Assessment Pain Scale: Faces Pain Score: 4  Faces Pain Scale: No hurt Pain Type: Acute pain Pain Location: Buttocks Pain Onset: Sudden Pain Intervention(s): Repositioned  Therapy/Group: Individual Therapy  Maylani Embree  [facility] 12/15/2018, 9:11 AM

## 2018-12-15 NOTE — Plan of Care (Signed)
  Problem: RH SAFETY Goal: RH STG ADHERE TO SAFETY PRECAUTIONS W/ASSISTANCE/DEVICE Description: STG Adhere to Safety Precautions With cues/reminders Assistance/Device. Outcome: Progressing   

## 2018-12-15 NOTE — Progress Notes (Signed)
Patient agitated and resistive to care at beginning of the shift. Refusing most of the physical assessment and personal care provided by staff. Pt resistive to medications, however compliant after multiple attempts. Blood glucose stable. No complaints verbalized. Pt less agitated this am.  CHG bath provided this am.

## 2018-12-15 NOTE — Plan of Care (Signed)
  Problem: Consults Goal: RH GENERAL PATIENT EDUCATION Description See Patient Education module for education specifics. Outcome: Progressing   Problem: RH BOWEL ELIMINATION Goal: RH STG MANAGE BOWEL WITH ASSISTANCE Description STG Manage Bowel with mod Assistance.   Outcome: Progressing   Problem: RH SKIN INTEGRITY Goal: RH STG SKIN FREE OF INFECTION/BREAKDOWN Description With mod assist   Outcome: Progressing Goal: RH STG MAINTAIN SKIN INTEGRITY WITH ASSISTANCE Description STG Maintain Skin Integrity With mod Assistance.   Outcome: Progressing   Problem: RH SAFETY Goal: RH STG ADHERE TO SAFETY PRECAUTIONS W/ASSISTANCE/DEVICE Description STG Adhere to Safety Precautions With cues/reminders Assistance/Device.  Outcome: Progressing   Problem: RH KNOWLEDGE DEFICIT GENERAL Goal: RH STG INCREASE KNOWLEDGE OF SELF CARE AFTER HOSPITALIZATION Description Family will be able to explain pt care needs and demonstrate ability to perform using handouts and resources independently  Outcome: Progressing

## 2018-12-15 NOTE — Progress Notes (Signed)
Physical Therapy Session Note  Patient Details  Name: VIVIENNE SANGIOVANNI MRN: [id number] Date of Birth: [birth year]  Today's Date: 12/15/2018 PT Individual Time: 1630-1640 PT Individual Time Calculation (min): 10 min   Short Term Goals: Week 3:  PT Short Term Goal 1 (Week 3): = LTG due to ELOS  Skilled Therapeutic Interventions/Progress Updates:    Pt supine in bed upon PT arrival, pt declines therapy and reports pain in her sacral area from wound 9/10. Therapist provides education to pt on pressure relief and positioning to minimize pressure on wound. Therapist assist pt into sidelying position with pillow propped behind her back for positioning. Therapist recommending rolling to other side and chaning positions every 30 minutes. Therapist also notified RN of pain and that pt is requesting pain medicine. Pt left supine with needs in reach and bed alarm set.   Pt missed 35 minutes due to pain/refusal.   Therapy Documentation Precautions:  Precautions Precautions: Fall Precaution Comments: Trach collar, supplemental 02 Restrictions Weight Bearing Restrictions: No    Therapy/Group: Individual Therapy  Netta Corrigan, PT, DPT 12/15/2018, 11:47 AM

## 2018-12-15 NOTE — Progress Notes (Signed)
Occupational Therapy Session Note  Patient Details  Name: Courtney Terry MRN: [id number] Date of Birth: Aug 22, 1961  Today's Date: 12/15/2018 OT Individual Time: 1104-1200 OT Individual Time Calculation (min): 56 min    Short Term Goals: Week 3:  OT Short Term Goal 1 (Week 3): Pt will complete 1 grooming task while sitting or standing at sink to increase OOB tolerance  OT Short Term Goal 2 (Week 3): Pt will complete BSC or toilet transfer with supervision   Skilled Therapeutic Interventions/Progress Updates:    Pt in bed to start session.  She agreed to transfer to the EOB and to get out of the room and use "some equipment to exercise". Min guard assist for stand pivot transfer to the wheelchair.  She then propelled her wheelchair down to the dayroom with max encouragement and min assist.  Min guard for transfer to the Nustep where she completed 2 sets of 3 mins with resistance on level 5 setting.  Her first set speed was only 23-24 steps per minute with the second set at 28-30 steps per minute.  Next had pt work on functional mobility without use of an assistive device.  She was able to complete 30' and 40' with min assist.  Oxygen sats monitored on 28% O2 trach collar at 3Ls.   They maintained 92% and greater immediately post exercise and mobility.  Finished session with return to the room via wheelchair with pt pushing.  She then transferred back to the bed with min guard assist and nursing was called per pt request to help with changing brief.  She was left with her friend [name] and nursing present.  Call button in reach as well.  Therapy Documentation Precautions:  Precautions Precautions: Fall Precaution Comments: Trach collar, supplemental 02 Restrictions Weight Bearing Restrictions: No   Vital Signs: Therapy Vitals Pulse Rate: (!) 101 Resp: 18 Patient Position (if appropriate): Sitting Oxygen Therapy SpO2: 93 % O2 Device: Tracheostomy Collar O2 Flow Rate (L/min): 3 L/min FiO2  (%): 28 % Patient Activity (if Appropriate): Other (Comment)(during use of the Nustep) Pulse Oximetry Type: Intermittent Pain: Pain Assessment Pain Scale: Faces Pain Score: 4  Faces Pain Scale: No hurt Pain Type: Acute pain Pain Location: Buttocks Pain Onset: Sudden Pain Intervention(s): Repositioned ADL: See Care Tool for some details of ADL   Therapy/Group: Individual Therapy  Kirby Argueta OTR/L 12/15/2018, 12:29 PM

## 2018-12-15 NOTE — Progress Notes (Signed)
Nutrition Follow-up  DOCUMENTATION CODES:   Severe malnutrition in context of chronic illness, Underweight  INTERVENTION:   - d/c Vital Cuisine  - Ensure Enlive po TID with meals, each supplement provides 350 kcal and 20 grams of protein (vanilla flavor)  - Double protein portions TID with meals  - Continue MVI with minerals daily  NUTRITION DIAGNOSIS:   Severe Malnutrition related to chronic illness (COPD, hx breast cancer) as evidenced by severe fat depletion, severe muscle depletion, percent weight loss (25% weight loss in 10 months).  Ongoing  GOAL:   Patient will meet greater than or equal to 90% of their needs  Progressing  MONITOR:   PO intake, Supplement acceptance, Diet advancement, Skin, Weight trends  REASON FOR ASSESSMENT:   Other (underweight BMI)    ASSESSMENT:   58 year old female with PMH significant for bipolar disorder, seizure disorder, breast cancer with lumpectomy, chronic hypotension, polysubstance abuse, COPD, tobacco abuse. Pt with recent hospitalizations for COPD exacerbation for acute on chronic hypoxemic and hypercarbic respiratory failure. Pt presented 11/08/18 for increasing SOB and decreased level of alertness requiring intubation. Urine drug screen positive for cocaine. Pt remained intubated through 11/19/18 with tracheostomy completed on 02/13. Pt required Cortrak tube for nutrition support but diet advanced to a dysphagia 2 with nectar-thick liquid and Cortrak removed prior to admission to CIR  3/5 - MBS with recommendations for Dysphagia 2 and thin liquids  At this time, plan is for pt to d/c to SNF. Per CCM, PMV 24/7 until Wednesday and if pt tolerates well, will cap trach on Wednesday and plan for decannulation on Friday.  Pt continues to resist medications and therapies at times.  Per weights in chart, pt's weight is up 44 lbs since admission to CIR. Do not suspect current weight of 141.56 lbs is accurate. Discussed with RN who will  re-weigh pt.  Spoke with pt and friend at bedside. Both pt and friend confirm that pt's is eating better on current diet order. Pt states that she would rather have Ensure Enlive over thickened Vital Cuisine shakes. RD to make this change. Will also order double protein portions as pt is reporting feeling hungry between meals. RN reports pt frequently asks for snacks.  Pt requesting graham crackers and Ensure Enlive at time of visit.  Meal Completion: 75-100% x last 8 recorded meals  Medications reviewed and include: Pepcid, folic acid, SSI, MVI with minerals, Prednisone, thiamine  Labs reviewed. CBG's: 89, 98, 103 x 24 hours  Diet Order:   Diet Order            DIET DYS 3 Room service appropriate? Yes; Fluid consistency: Thin  Diet effective 1000              EDUCATION NEEDS:   Not appropriate for education at this time  Skin:  Skin Assessment: Skin Integrity Issues: Stage II: mid perineum Stage III: mid perineum  Last BM:  3/9 large type 6  Height:   Ht Readings from Last 1 Encounters:  12/04/18 5\' 10"  (1.778 m)    Weight:   Wt Readings from Last 1 Encounters:  12/15/18 64.2 kg    Ideal Body Weight:  68.2 kg  BMI:  Body mass index is 20.31 kg/m.  Estimated Nutritional Needs:   Kcal:  1650-1850  Protein:  85-100 grams  Fluid:  >/= 1.7 L    Gaynell Face, MS, RD, LDN Inpatient Clinical Dietitian Pager: [phone] Weekend/After Hours: [phone]

## 2018-12-15 NOTE — Progress Notes (Signed)
[facility] PHYSICAL MEDICINE & REHABILITATION PROGRESS NOTE   Subjective/Complaints: No real issues.  Still resisting therapy medications at times.  Calm and collected this morning  ROS: Limited due to cognitive/behavioral    Objective:   No results found. Recent Labs    12/14/18 0632  WBC 8.4  HGB 11.0*  HCT 35.9*  PLT 353   Recent Labs    12/14/18 0632  NA 139  K 4.1  CL 100  CO2 30  GLUCOSE 86  BUN 14  CREATININE 0.37*  CALCIUM 9.0    Intake/Output Summary (Last 24 hours) at 12/15/2018 0852 Last data filed at 12/15/2018 0815 Gross per 24 hour  Intake 602 ml  Output -  Net 602 ml     Physical Exam: Vital Signs Blood pressure 107/72, pulse 81, temperature 98 F (36.7 C), temperature source Oral, resp. rate 18, height 5\' 10"  (1.778 m), weight 64.2 kg, SpO2 98 %. Constitutional: No distress . Vital signs reviewed. HEENT: EOMI, oral membranes moist Neck: supple, #4 trach without pmv Cardiovascular: RRR without murmur. No JVD    Respiratory: CTA Bilaterally without wheezes or rales. Normal effort    GI: BS +, non-tender, non-distended  Musc: No edema or tenderness in extremities. Skin: No evidence of breakdown, no evidence of rash Neurologic: Alert Motor: Grossly 4/5 all 4 Psych: Cooperative   Assessment/Plan: 1. Functional deficits secondary to debilitiy from respiratory failure which require interdisciplinary therapy in a comprehensive inpatient rehab setting.  Physiatrist is providing close team supervision and 24 hour management of active medical problems listed below.  Physiatrist and rehab team continue to assess barriers to discharge/monitor patient progress toward functional and medical goals  Care Tool:  Bathing  Bathing activity did not occur: Refused Body parts bathed by patient: Right arm, Left arm, Chest, Abdomen, Right upper leg, Left upper leg, Face   Body parts bathed by helper: Front perineal area, Buttocks, Left lower leg, Right  lower leg     Bathing assist Assist Level: Moderate Assistance - Patient 50 - 74%     Upper Body Dressing/Undressing Upper body dressing Upper body dressing/undressing activity did not occur (including orthotics): Refused What is the patient wearing?: Pull over shirt    Upper body assist Assist Level: Moderate Assistance - Patient 50 - 74%    Lower Body Dressing/Undressing Lower body dressing    Lower body dressing activity did not occur: Refused What is the patient wearing?: Pants     Lower body assist Assist for lower body dressing: Maximal Assistance - Patient 25 - 49%     Toileting Toileting Toileting Activity did not occur Landscape architect and hygiene only): Refused  Toileting assist Assist for toileting: Maximal Assistance - Patient 25 - 49%     Transfers Chair/bed transfer  Transfers assist     Chair/bed transfer assist level: Contact Guard/Touching assist     Locomotion Ambulation   Ambulation assist   Ambulation activity did not occur: Safety/medical concerns  Assist level: Contact Guard/Touching assist Assistive device: Parallel bars Max distance: 10'   Walk 10 feet activity   Assist  Walk 10 feet activity did not occur: Refused  Assist level: Contact Guard/Touching assist Assistive device: Parallel bars   Walk 50 feet activity   Assist Walk 50 feet with 2 turns activity did not occur: Safety/medical concerns         Walk 150 feet activity   Assist Walk 150 feet activity did not occur: Safety/medical concerns  Walk 10 feet on uneven surface  activity   Assist Walk 10 feet on uneven surfaces activity did not occur: Safety/medical concerns         Wheelchair     Assist   Type of Wheelchair: Manual Wheelchair activity did not occur: Refused  Wheelchair assist level: Supervision/Verbal cueing Max wheelchair distance: 100'    Wheelchair 50 feet with 2 turns activity    Assist    Wheelchair 50  feet with 2 turns activity did not occur: Refused   Assist Level: Supervision/Verbal cueing   Wheelchair 150 feet activity     Assist Wheelchair 150 feet activity did not occur: Safety/medical concerns   Assist Level: Supervision/Verbal cueing    Medical Problem List and Plan: 1.Debility and metabolic encephalopathysecondary to acute on chronic respiratory failure status post tracheostomy 11/19/2018.Pulmonary services to follow-up on plan decannulation  Continue CIR at 15/7, team conference today, placement pending 2.  DVT prophyllaxis Subcutaneous Lovenox 3. Pain Management:Tylenol as needed 4. Mood/bipolar disorder:Prozac 40 mg daily for mood stabilization, Klonopin 0.5 mg 3 times a day as needed for anxiety or agitation -fair sleep patterns 5. Neuropsych: This patientis notcapable of making decisions on herown behalf. 6. Skin/Wound Care:Routine skin checks 7. Fluids/Electrolytes/Nutrition:encourage PO  8. Chronic hypotension. Patient had been on ProAmatine 10 mg 3 times a day as well as Florinef prior to admission. Resume as neededpendng tolerance of therapy Vitals:   12/15/18 0825 12/15/18 0831  BP:    Pulse:  81  Resp:  18  Temp:    SpO2: 98% 98%   Asymptomatic hypotension on 3/9 9.COPD with history of tobacco abuse. Patient on 2 L oxygen at home.Continue chronic prednisone. Follow-up pulmonary services Dr.Wert.Continue nebulizersas directed  -now with #4 cuffless  - continue PMV/plugging trials per CCM, defer permanent plugging and decannulation to CCM   10. Seizure disorder. ContinueDepakoteas prior to admission.. EEG negative No clinical seizure activity 11.Hypothyroidism. Synthroid 12. Dysphagia. Diet upgraded to D3 thins, tolerating well and eating more 13. Polysubstance abuse. UDS positive for cocaine. Provide counseling 14. History of breast cancer with lumpectomy. Patient on Arimidex 1 mg daily prior to admission.OK to restart  per Dr Burr Medico Heme/Onc also Dr Burr Medico wants pt to f/u since she hasn't been seen since 2018 15.  Leukocytosis with chronic steroid use  Afebrile  WBCs 8.4 3/9 16.  Medication compliance an ongoing issue. Daily ed by staff.  17. Labile blood glucose   controlled on 3/10  Continue to monitor for trend 18.  Acute blood loss anemia  Hemoglobin 11.0 3.9  Continue to monitor  ELOS: 18 days A FACE TO FACE EVALUATION WAS PERFORMED  Meredith Staggers 12/15/2018, 8:52 AM

## 2018-12-16 ENCOUNTER — Inpatient Hospital Stay (HOSPITAL_COMMUNITY): Payer: Medicaid Other | Admitting: Occupational Therapy

## 2018-12-16 ENCOUNTER — Inpatient Hospital Stay (HOSPITAL_COMMUNITY): Payer: Medicaid Other

## 2018-12-16 ENCOUNTER — Inpatient Hospital Stay (HOSPITAL_COMMUNITY): Payer: Medicaid Other | Admitting: Physical Therapy

## 2018-12-16 LAB — GLUCOSE, CAPILLARY
Glucose-Capillary: 88 mg/dL (ref 70–99)
Glucose-Capillary: 115 mg/dL — ABNORMAL HIGH (ref 70–99)
Glucose-Capillary: 82 mg/dL (ref 70–99)
Glucose-Capillary: 91 mg/dL (ref 70–99)

## 2018-12-16 NOTE — Progress Notes (Signed)
Pt has done well with capping trial this afternoon. Pt did complain of SOB shortly after cap placed and patient placed on 2L O2 [place]. Pt has maintained sats well and RN has not suctioned patient this afternoon. Will continue to monitor.

## 2018-12-16 NOTE — Progress Notes (Signed)
Pt refused SLP session this morning and also refused most of her scheduled medications. Pt's family helped to get her dressed this am. Pt encouraged to wear PMSV as much as possible and pt is agreeable to this goal for today.

## 2018-12-16 NOTE — Progress Notes (Signed)
Speech Language Pathology Daily Session Note  Patient Details  Name: LEONIA HEATHERLY MRN: [id number] Date of Birth: 02/13/1961  Today's Date: 12/16/2018 SLP Individual Time: 3710-6269 SLP Individual Time Calculation (min): 15 min  Short Term Goals: Week 3: SLP Short Term Goal 1 (Week 3): Patient will consume dysphagia 3 with thin liquids with minimal overt s/s of aspiration and Min A verbal cues for use of swallowing compensatory strategies.  SLP Short Term Goal 2 (Week 3): Pt will demonstrate selective attention to tasks for 30 minutes in midlly distracting environment with Min A cues.  SLP Short Term Goal 3 (Week 3): Given Mod A cues, pt will increase vocal intensity to achieve ~ 75% intelligibility at the phrase level in 8 out of 10 opportunities.   SLP Short Term Goal 4 (Week 3): Pt will complete basic problem solving tasks with Mod A cues.  SLP Short Term Goal 5 (Week 3): Pt will donn and doff PMSV with Min A cues in 8 out of 10 opportunities.   Skilled Therapeutic Interventions:Skilled ST services focused on speech skills. SLP attempted to facilitated basic problem solving, attention and recall skills in sequencing and basic money management task, pt was seated in chair, however pt refused participation. Pt stated " I am done with this. I want to home go. I am not in the mood today." Pt demonstrated 100% intelligibility at sentence level with PMSV in place and vital signs stable. SLP provided education about plan of care, removal of trach with continued tolerance and then discharge to SNF for further rehabilitation Pt stated " I am not going" and became increasing upset. Pt agreed to set today's ST goal to tolerate PMSV during all waking hours, verbalized precautions with  and demonstrated donning/donoff PMSV with supervision A verbal cues. SLP assisted pt back to bed and communicated gaol with nurse, Vikki Ports.  Pt was left in room with call bell within reach, Michael in room and bed alarm set.  SLP reccomends to continue skilled services.     Pain Pain Assessment Pain Score: 0-No pain  Therapy/Group: Individual Therapy  Cyndee Giammarco  [facility] 12/16/2018, 8:32 AM

## 2018-12-16 NOTE — Progress Notes (Signed)
   NAME:  Courtney Terry, MRN:  [id number], DOB:  1961/02/02, LOS: 73 ADMISSION DATE:  11/27/2018, CONSULTATION DATE:  11/08/18 REFERRING MD:  Karmen Bongo, MD, CHIEF COMPLAINT:  Respiratory failure    Brief History   58 year old female with COPD (FEV1 25%) and chronic hypoxemic respiratory failure. Admitted 2/2-2/21 with acute on chronic hypoxemic and hypercarbic respiratory failure requiring intubation. UDS positive for cocaine. Tracheostomy placement on 2/13. 2/21 Admitted to [facility].  PCCM asked to consult 2/27 for tracheostomy care.   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 HITT negative.  2/16 Sedate, gtt's being weaned off, on ATC during day but then required MV for resp distress 2/21 IP Rehab  3/4: Downsized to size 4 cuffless tracheostomy 3/9 increasing PMV goals to 24/7.  3/11: Initiating capping trials Consults:  PCCM   Procedures:  ETT 2/2 >> 2/13 Trach 2/13 >> R PICC 2/13 > 2/26   Significant Diagnostic Tests:  CTA 2/2: Neg for PE, emphysema, multiple pulmonary nodules, ETT in place CT head w/o contrast 2/2: stable/normal EEG 2/3: moderate global encephalopathy,  No epileptiform activities  UDS 2/2 > cocaine  Micro Data:  Resp PCR 2/2 > negative  UC 2/2 > 100k corynebacterium  Sputum 2/2 > normal flora  BCx2 2/2 > negative  Cervical Swab 2/14 > trichomonas   Antimicrobials:  Azithromycin 2/2 > 2/6 Ceftriaxone 2/3 >2/7 Flagyl 2/14 >off  Interim history/subjective:  Doing well  Objective   Blood pressure 105/60, pulse 65, temperature 98.2 F (36.8 C), temperature source Oral, resp. rate 20, height 5\' 10"  (1.778 m), weight 64.2 kg, SpO2 96 %.    FiO2 (%):  [28 %] 28 %    Intake/Output Summary (Last 24 hours) at 12/16/2018 1026 Last data filed at 12/16/2018 0816 Gross per 24 hour  Intake 1080 ml  Output no documentation  Net 1080 ml   Filed Weights   12/12/18 0520 12/15/18 0526 12/16/18 0430  Weight: 64.4 kg 64.2 kg 64.2 kg   EXAM: General 58 year old female currently resting in bed she is in no acute distress HEENT normocephalic atraumatic her Passy-Muir valve is in place her phonation has improved she is tolerating this well Pulmonary: Clear to auscultation without accessory use Cardiac: Regular rate and rhythm Abdomen: Soft, not tender no organomegaly Extremities: Warm dry brisk capillary refill Neuro: Awake alert no focal deficits GU: Voiding  Resolved Hospital Problem list     Assessment & Plan:   Acute on Chronic Hypoxemic and Hypercarbic Respiratory Failure in setting of severe COPD (FEV1 25%), s/p tracheostomy sedated Trach status  Discussion Seems to be tolerating PMV well.  Not requiring suctioning.  I have reviewed the notes from rehabilitation services, sounds like she is not always cooperative.  I think she can likely be decannulated, but would like her to demonstrate a successful capping trial prior to this  Plan We will go ahead and Lurline Idol, will monitor her with continuous pulse oximetry for next 48 hours and anticipate possible decannulation on Friday if no issues May need oxygen via nasal cannula  Erick Colace ACNP-BC [place] Pager # [phone] OR # [phone] if no answer

## 2018-12-16 NOTE — Progress Notes (Signed)
[facility] PHYSICAL MEDICINE & REHABILITATION PROGRESS NOTE   Subjective/Complaints: Had a reasonable night. No new problems reported. Fluctuating participation and compliance  ROS: Limited due to cognitive/behavioral    Objective:   No results found. Recent Labs    12/14/18 0632  WBC 8.4  HGB 11.0*  HCT 35.9*  PLT 353   Recent Labs    12/14/18 0632  NA 139  K 4.1  CL 100  CO2 30  GLUCOSE 86  BUN 14  CREATININE 0.37*  CALCIUM 9.0    Intake/Output Summary (Last 24 hours) at 12/16/2018 0912 Last data filed at 12/16/2018 0816 Gross per 24 hour  Intake 1080 ml  Output -  Net 1080 ml     Physical Exam: Vital Signs Blood pressure 105/60, pulse 65, temperature 98.2 F (36.8 C), temperature source Oral, resp. rate 20, height 5\' 10"  (1.778 m), weight 64.2 kg, SpO2 96 %. Constitutional: No distress . Vital signs reviewed. HEENT: EOMI, oral membranes moist Neck: #4 trach wihtout pmv Cardiovascular: RRR without murmur. No JVD    Respiratory: CTA Bilaterally without wheezes or rales. Normal effort    GI: BS +, non-tender, non-distended  Musc: No edema or tenderness in extremities. Skin: No evidence of breakdown, no evidence of rash Neurologic: Alert Motor: Grossly 4/5 all 4's Psych: pleasant   Assessment/Plan: 1. Functional deficits secondary to debilitiy from respiratory failure which require interdisciplinary therapy in a comprehensive inpatient rehab setting.  Physiatrist is providing close team supervision and 24 hour management of active medical problems listed below.  Physiatrist and rehab team continue to assess barriers to discharge/monitor patient progress toward functional and medical goals  Care Tool:  Bathing  Bathing activity did not occur: Refused Body parts bathed by patient: Right arm, Left arm, Chest, Abdomen, Right upper leg, Left upper leg, Face   Body parts bathed by helper: Front perineal area, Buttocks, Left lower leg, Right lower leg      Bathing assist Assist Level: Moderate Assistance - Patient 50 - 74%     Upper Body Dressing/Undressing Upper body dressing Upper body dressing/undressing activity did not occur (including orthotics): Refused What is the patient wearing?: Pull over shirt    Upper body assist Assist Level: Moderate Assistance - Patient 50 - 74%    Lower Body Dressing/Undressing Lower body dressing    Lower body dressing activity did not occur: Refused What is the patient wearing?: Pants     Lower body assist Assist for lower body dressing: Maximal Assistance - Patient 25 - 49%     Toileting Toileting Toileting Activity did not occur Landscape architect and hygiene only): Refused  Toileting assist Assist for toileting: Maximal Assistance - Patient 25 - 49%     Transfers Chair/bed transfer  Transfers assist     Chair/bed transfer assist level: Contact Guard/Touching assist     Locomotion Ambulation   Ambulation assist   Ambulation activity did not occur: Safety/medical concerns  Assist level: Minimal Assistance - Patient > 75% Assistive device: Hand held assist Max distance: 35'   Walk 10 feet activity   Assist  Walk 10 feet activity did not occur: Refused  Assist level: Contact Guard/Touching assist Assistive device: Parallel bars   Walk 50 feet activity   Assist Walk 50 feet with 2 turns activity did not occur: Safety/medical concerns         Walk 150 feet activity   Assist Walk 150 feet activity did not occur: Safety/medical concerns  Walk 10 feet on uneven surface  activity   Assist Walk 10 feet on uneven surfaces activity did not occur: Safety/medical concerns         Wheelchair     Assist   Type of Wheelchair: Manual Wheelchair activity did not occur: Refused  Wheelchair assist level: Supervision/Verbal cueing Max wheelchair distance: 100'    Wheelchair 50 feet with 2 turns activity    Assist    Wheelchair 50 feet  with 2 turns activity did not occur: Refused   Assist Level: Supervision/Verbal cueing   Wheelchair 150 feet activity     Assist Wheelchair 150 feet activity did not occur: Safety/medical concerns   Assist Level: Supervision/Verbal cueing    Medical Problem List and Plan: 1.Debility and metabolic encephalopathysecondary to acute on chronic respiratory failure status post tracheostomy 11/19/2018.Pulmonary services to follow-up on plan decannulation  Continue CIR 15/7 2.  DVT prophyllaxis Subcutaneous Lovenox 3. Pain Management:Tylenol as needed 4. Mood/bipolar disorder:Prozac 40 mg daily for mood stabilization, Klonopin 0.5 mg 3 times a day as needed for anxiety or agitation -fair sleep patterns 5. Neuropsych: This patientis notcapable of making decisions on herown behalf. 6. Skin/Wound Care:Routine skin checks 7. Fluids/Electrolytes/Nutrition:encourage PO  8. Chronic hypotension. Patient had been on ProAmatine 10 mg 3 times a day as well as Florinef prior to admission. Resume as neededpendng tolerance of therapy Vitals:   12/16/18 0430 12/16/18 0737  BP: 105/60   Pulse: 67 65  Resp: 16 20  Temp: 98.2 F (36.8 C)   SpO2: 99% 96%   Asymptomatic hypotension on 3/11 9.COPD with history of tobacco abuse. Patient on 2 L oxygen at home.Continue chronic prednisone. Follow-up pulmonary services Dr.Wert.Continue nebulizersas directed  -now with #4 cuffless  - continue PMV/plugging trials with some improvement. Defer decannulation to CCM  10. Seizure disorder. ContinueDepakoteas prior to admission.. EEG negative No clinical seizure activity 11.Hypothyroidism. Synthroid 12. Dysphagia. Diet upgraded to D3 thins, tolerating well and eating more 13. Polysubstance abuse. UDS positive for cocaine. Provide counseling 14. History of breast cancer with lumpectomy. Patient on Arimidex 1 mg daily prior to admission.OK to restart per Dr Burr Medico Heme/Onc also Dr Burr Medico  wants pt to f/u since she hasn't been seen since 2018 15.  Leukocytosis with chronic steroid use  Afebrile  WBCs 8.4 3/9 16.  Medication compliance an ongoing issue. Daily ed by staff.  17. Labile blood glucose   controlled on 3/11  Continue to monitor for trend 18.  Acute blood loss anemia  Hemoglobin 11.0 3.9  Continue to monitor  ELOS: 19 days A FACE TO FACE EVALUATION WAS PERFORMED  Meredith Staggers 12/16/2018, 9:12 AM

## 2018-12-16 NOTE — Progress Notes (Signed)
Pt refused all medications and snacks this shift. Resting for 10 hours. Pt allowed limited assessment, however minimal verbal responses. Easily irritated with care, resistive at times. Pt allowed minimal care of trach, no suctioning required, trach care performed

## 2018-12-16 NOTE — Progress Notes (Signed)
Patient's trach capped.  No distress and ok on roomair. Sat 98% HR 79, RR 20.  Will continue to monitor patient.

## 2018-12-16 NOTE — Progress Notes (Signed)
Physical Therapy Session Note  Patient Details  Name: Courtney Terry MRN: [id number] Date of Birth: [birth year]  Today's Date: 12/16/2018     Short Term Goals: Week 3:  PT Short Term Goal 1 (Week 3): = LTG due to ELOS  Skilled Therapeutic Interventions/Progress Updates:   Pt refused treatment and resistant to all education from PT for importance of OOB activity to prepare for safet d/c. Pt left in bed with all needs met.      Therapy Documentation Precautions:  Precautions Precautions: Fall Precaution Comments: Trach collar, supplemental 02 Restrictions Weight Bearing Restrictions: No General: PT Amount of Missed Time (min): 60 Minutes PT Missed Treatment Reason: Patient unwilling to participate Vital Signs: Therapy Vitals Temp: 98.4 F (36.9 C) Temp Source: Oral Pulse Rate: 85 Resp: 16 BP: 91/73 Patient Position (if appropriate): Lying Oxygen Therapy SpO2: 100 % O2 Device: Nasal Cannula O2 Flow Rate (L/min): 2 L/min   Therapy/Group: Individual Therapy  Lorie Phenix 12/16/2018, 11:02 PM

## 2018-12-17 ENCOUNTER — Inpatient Hospital Stay (HOSPITAL_COMMUNITY): Payer: Medicaid Other | Admitting: Speech Pathology

## 2018-12-17 ENCOUNTER — Inpatient Hospital Stay (HOSPITAL_COMMUNITY): Payer: Medicaid Other | Admitting: Occupational Therapy

## 2018-12-17 ENCOUNTER — Inpatient Hospital Stay (HOSPITAL_COMMUNITY): Payer: Medicaid Other

## 2018-12-17 ENCOUNTER — Inpatient Hospital Stay (HOSPITAL_COMMUNITY): Payer: Medicaid Other | Admitting: Physical Therapy

## 2018-12-17 LAB — GLUCOSE, CAPILLARY
Glucose-Capillary: 81 mg/dL (ref 70–99)
Glucose-Capillary: 53 mg/dL — ABNORMAL LOW (ref 70–99)
Glucose-Capillary: 120 mg/dL — ABNORMAL HIGH (ref 70–99)
Glucose-Capillary: 80 mg/dL (ref 70–99)
Glucose-Capillary: 84 mg/dL (ref 70–99)

## 2018-12-17 NOTE — Patient Care Conference (Signed)
Inpatient RehabilitationTeam Conference and Plan of Care Update Date: 12/15/2018   Time: 2:10 PM    Patient Name: Courtney Terry      Medical Record Number: [id number]  Date of Birth: [birth year] Sex: Female         Room/Bed: 4W09C/4W09C-01 Payor Info: Payor: MEDICAID [place] / Plan: MEDICAID  ACCESS / Product Type: *No Product type* /    Admitting Diagnosis: debility  resp failure  Admit Date/Time:  11/27/2018  3:56 PM Admission Comments: No comment available   Primary Diagnosis:  <principal problem not specified> Principal Problem: <principal problem not specified>  Patient Active Problem List   Diagnosis Date Noted  . Acute blood loss anemia   . Leucocytosis   . Labile blood glucose   . Idiopathic hypotension   . Bipolar affective disorder in remission ([place])   . Hypoxemia   . Acute respiratory failure with hypoxemia ([place])   . Tracheostomy care ([place])   . Metabolic encephalopathy 09/73/5329  . Tracheostomy status ([place])   . Acute respiratory failure with hypoxia and hypercapnia ([place]) 11/08/2018  . Severely underweight adult 09/13/2018  . COPD with acute exacerbation ([place]) 09/12/2018  . Syncope 09/12/2018  . Tobacco use disorder 05/01/2018  . Depression 01/26/2018  . CHF (congestive heart failure) ([place]) 11/11/2017  . PID (acute pelvic inflammatory disease) 11/11/2017  . Seizures ([place])   . Hypertension   . Palliative care encounter   . Goals of care, counseling/discussion   . Hypoxia   . Rhinovirus infection 09/30/2016  . Hypothyroidism 09/29/2016  . Bipolar I disorder ([place]) 09/29/2016  . Acute and chronic respiratory failure with hypercapnia ([place]) 09/28/2016  . Polysubstance abuse ([place]) 08/13/2016  . Cocaine abuse ([place]) 08/13/2016  . Schizophrenia ([place]) 04/23/2016  . COPD (chronic obstructive pulmonary disease) ([place]) 04/23/2016  . Chronic respiratory failure with hypoxia ([place]) 03/29/2016  . Acute on chronic respiratory failure ([place]) 02/28/2016  . Protein-calorie  malnutrition, severe 02/27/2016  . Elevated troponin   . History of breast cancer 10/20/2015  . Exposure of implanted prstht mtrl to surrnd org/tiss, init 06/19/2015  . Complication of internal breast prosthesis 06/19/2015  . Acquired absence of breast and nipple 06/06/2015  . H/O right mastectomy 06/06/2015  . COPD exacerbation ([place]) 06/29/2014  . COPD GOLD IV D 02/27/2013  . Malignant neoplasm of upper-outer quadrant of female breast ([place]) 01/31/2012  . Epilepsy ([place]) 12/02/2007    Expected Discharge Date: Expected Discharge Date: (SNF)  Team Members Present: Physician leading conference: Dr. Alger Simons Social Worker Present: Lennart Pall, LCSW Nurse Present: Mohammed Kindle, LPN PT Present: Other (comment)(Taylor Ervin Knack, PT) OT Present: Willeen Cass, OT SLP Present: Charolett Bumpers, SLP     Current Status/Progress Goal Weekly Team Focus  Medical   4.  Trach, tolerating PMV trials somewhat.  Critical care medicine following.  Patient with ongoing cognitive and behavioral issues  Stabilizing medical issues, trach for discharge  See above   Bowel/Bladder   Continent of B/B, with periods of incontinence of both LBM-12/14/18  Regain regular pattern of bowel and bladder regimen  Timed toileting while awake, assist with toileting needs   Swallow/Nutrition/ Hydration   Dys 3 and thin, Sup A  Sup A - upgraded and met   regular trials , fast rate    ADL's   min A for functional transfers, bed mobility with supervision, self care with min A overall. Pt self limiting but can do more when motivated to participate  Downgraded to Min-Mod A overall  participation, strengthening, functional transers, OOB tolerance, pt education   Mobility   S bed mobility, CGA transfers, S w/c mobility, CGA short distance gait in // bars  min A overall  OOB tolerance, participation, standing balance   Communication   Min A, phrase/sentence level with PMSV  Sup A  expressing wants/needs, ideas     Safety/Cognition/ Behavioral Observations  Mod A basic problem solving, sustianed attention 15 minutes  MIn A - upgraded basic problem solving goal from max to min   basic problem solving, sustainted attention, recall with aid    Pain   No c/o pain  Remain pain free  Assess pain every shift and prn   Skin   Stage II to area between buttocks-healing Stage III to sacrum-area pink/red with yellow slough-daily drsg with santyl  reposition frequently, keep wounds clean and free of infection  Assess skin every shift and prn    Rehab Goals Patient on target to meet rehab goals: No *See Care Plan and progress notes for long and short-term goals.     Barriers to Discharge  Current Status/Progress Possible Resolutions Date Resolved   Physician    Trach;Medical stability        Continue per medical problem list      Nursing                  PT  Inaccessible home environment;Decreased caregiver support;Medical stability;Home environment access/layout;Trach;Lack of/limited family support;Insurance for SNF coverage;Medication compliance;Behavior;Other (comments)  inconsistent willinginess to participate in therapy              OT                  SLP                SW                Discharge Planning/Teaching Needs:  DC plan has changed to SNF - hope to be able to remove trach soon which will make SNF placement more viable option.  NA   Team Discussion:  May need longer with trach than originally hoped for.  SW will pursue SNF with trach which will limit available facilities.  Not yet ready for plugging either.  Still resistive to care at times and to therapies.  Min assist with ADLs;  CGA with tfs and amb ~ 40' with min assist.  Using PMSV except when sleeping.  Decreased attention and problem solving continue.  Revisions to Treatment Plan:  NA    Continued Need for Acute Rehabilitation Level of Care: The patient requires daily medical management by a physician with specialized training  in physical medicine and rehabilitation for the following conditions: Daily direction of a multidisciplinary physical rehabilitation program to ensure safe treatment while eliciting the highest outcome that is of practical value to the patient.: Yes Daily medical management of patient stability for increased activity during participation in an intensive rehabilitation regime.: Yes Daily analysis of laboratory values and/or radiology reports with any subsequent need for medication adjustment of medical intervention for : Post surgical problems;Pulmonary problems;Neurological problems   I attest that I was present, lead the team conference, and concur with the assessment and plan of the team.   Izabela Ow 12/17/2018, 11:51 AM

## 2018-12-17 NOTE — Progress Notes (Signed)
Occupational Therapy Session Note  Patient Details  Name: Courtney Terry MRN: [id number] Date of Birth: 07-04-1961  Today's Date: 12/17/2018 OT Individual Time: 1515-1600 OT Individual Time Calculation (min): 45 min    Short Term Goals: Week 2:  OT Short Term Goal 1 (Week 2): STGs=LTGs due to ELOS OT Short Term Goal 1 - Progress (Week 2): Not met  Skilled Therapeutic Interventions/Progress Updates:    1:1. Pt received in bed agreeable to tx, however reporting pain in throat. Trach capped RN aware and delivered medication. Pt completes w/c mobility to/from all tx destinations. Pt states, "I want to walk." 3L O2 during activity and O2 remains >95% throughout. Pt completes 2x50 feet ambulation in day room with no AD and min A with VC for looking forward. Pt completes 2x10 cycles seated reciprocal movement training perched on kinetron seat with VC for no back/arm support to challenge trunk control. Pt stands at sink with S to try on wigs with visitors present. Exited session with pt supine in bed, exit alarm on and call light in reach  Therapy Documentation Precautions:  Precautions Precautions: Fall Precaution Comments: Trach collar, supplemental 02 Restrictions Weight Bearing Restrictions: No   Therapy/Group: Individual Therapy  Tonny Branch 12/17/2018, 4:03 PM

## 2018-12-17 NOTE — Progress Notes (Signed)
Occupational Therapy Note  Patient Details  Name: Courtney Terry MRN: [id number] Date of Birth: [birth year]  Today's Date: 12/17/2018 OT Missed Time: 52 Minutes Missed Time Reason: Patient unwilling/refused to participate without medical reason  Pt missed 30 mins scheduled OT treatment session due to refusal. RN aware.  [name] 12/17/2018, 3:59 PM

## 2018-12-17 NOTE — Progress Notes (Signed)
Physical Therapy Note  Patient Details  Name: Courtney Terry MRN: [id number] Date of Birth: [birth year] Today's Date: 12/17/2018    Attempted to see patient for scheduled therapy session. Pt refused any OOB mobility or participation in therapy session and becomes verbally abusive towards this therapist and her boyfriend [name]. Pt missed 30 min of therapy due to refusal.    Excell Seltzer, PT, DPT  12/17/2018, 12:04 PM

## 2018-12-17 NOTE — Progress Notes (Signed)
Social Work Patient ID: Courtney Terry, female   DOB: Oct 13, 1960, 58 y.o.   MRN: [id number]  Have met with pt to review team conference and discussing plan for SNF.  Pt now adamantly refusing SNF and states "I'm going home."  Attempted to explain that family with concerns about her care needs, however, pt denies any concerns.   At this point, will need to follow up with MD about pt's competency to make these decisions as well as possible plans for trach removal?   Have sent FL2 out to al area SNFs to continue to work on this plan as pt's social situation is so unpredictable.  Will keep team posted on plans.  Michal Callicott, LCSW

## 2018-12-17 NOTE — Progress Notes (Signed)
[facility] PHYSICAL MEDICINE & REHABILITATION PROGRESS NOTE   Subjective/Complaints: Up in chair with SLP. Eating pancakes. In good spirits. Tolerated   ROS: Patient denies fever, rash, sore throat, blurred vision, nausea, vomiting, diarrhea, cough, shortness of breath or chest pain, joint or back pain, headache, or mood change.     Objective:   No results found. No results for input(s): WBC, HGB, HCT, PLT in the last 72 hours. No results for input(s): NA, K, CL, CO2, GLUCOSE, BUN, CREATININE, CALCIUM in the last 72 hours.  Intake/Output Summary (Last 24 hours) at 12/17/2018 1057 Last data filed at 12/16/2018 1900 Gross per 24 hour  Intake 600 ml  Output -  Net 600 ml     Physical Exam: Vital Signs Blood pressure 93/63, pulse 69, temperature 97.8 F (36.6 C), temperature source Oral, resp. rate 15, height 5\' 10"  (1.778 m), weight 64.2 kg, SpO2 99 %. Constitutional: No distress . Vital signs reviewed. HEENT: EOMI, oral membranes moist Neck: supple, #4 trach with plug.  Cardiovascular: RRR without murmur. No JVD    Respiratory: CTA Bilaterally without wheezes or rales. Using some accessory muscles tobreathe when talking/eating. Phonating fairly well    GI: BS +, non-tender, non-distended  Musc: No edema or tenderness in extremities. Skin: No evidence of breakdown, no evidence of rash Neurologic: Alert Motor: Grossly 4/5 all 4's Psych: in good spirits   Assessment/Plan: 1. Functional deficits secondary to debilitiy from respiratory failure which require interdisciplinary therapy in a comprehensive inpatient rehab setting.  Physiatrist is providing close team supervision and 24 hour management of active medical problems listed below.  Physiatrist and rehab team continue to assess barriers to discharge/monitor patient progress toward functional and medical goals  Care Tool:  Bathing  Bathing activity did not occur: Refused Body parts bathed by patient: Right arm, Left  arm, Chest, Abdomen, Right upper leg, Left upper leg, Face   Body parts bathed by helper: Front perineal area, Buttocks, Left lower leg, Right lower leg     Bathing assist Assist Level: Moderate Assistance - Patient 50 - 74%     Upper Body Dressing/Undressing Upper body dressing Upper body dressing/undressing activity did not occur (including orthotics): Refused What is the patient wearing?: Pull over shirt    Upper body assist Assist Level: Moderate Assistance - Patient 50 - 74%    Lower Body Dressing/Undressing Lower body dressing    Lower body dressing activity did not occur: Refused What is the patient wearing?: Pants     Lower body assist Assist for lower body dressing: Maximal Assistance - Patient 25 - 49%     Toileting Toileting Toileting Activity did not occur Landscape architect and hygiene only): Refused  Toileting assist Assist for toileting: Maximal Assistance - Patient 25 - 49%     Transfers Chair/bed transfer  Transfers assist     Chair/bed transfer assist level: Contact Guard/Touching assist     Locomotion Ambulation   Ambulation assist   Ambulation activity did not occur: Safety/medical concerns  Assist level: Minimal Assistance - Patient > 75% Assistive device: Hand held assist Max distance: 35'   Walk 10 feet activity   Assist  Walk 10 feet activity did not occur: Refused  Assist level: Contact Guard/Touching assist Assistive device: Parallel bars   Walk 50 feet activity   Assist Walk 50 feet with 2 turns activity did not occur: Safety/medical concerns         Walk 150 feet activity   Assist Walk 150 feet activity  did not occur: Safety/medical concerns         Walk 10 feet on uneven surface  activity   Assist Walk 10 feet on uneven surfaces activity did not occur: Safety/medical concerns         Wheelchair     Assist   Type of Wheelchair: Manual Wheelchair activity did not occur: Refused  Wheelchair  assist level: Supervision/Verbal cueing Max wheelchair distance: 100'    Wheelchair 50 feet with 2 turns activity    Assist    Wheelchair 50 feet with 2 turns activity did not occur: Refused   Assist Level: Supervision/Verbal cueing   Wheelchair 150 feet activity     Assist Wheelchair 150 feet activity did not occur: Safety/medical concerns   Assist Level: Supervision/Verbal cueing    Medical Problem List and Plan: 1.Debility and metabolic encephalopathysecondary to acute on chronic respiratory failure status post tracheostomy 11/19/2018.Pulmonary services to follow-up on plan decannulation  Continue CIR 15/7 therapies 2.  DVT prophyllaxis Subcutaneous Lovenox 3. Pain Management:Tylenol as needed 4. Mood/bipolar disorder:Prozac 40 mg daily for mood stabilization, Klonopin 0.5 mg 3 times a day as needed for anxiety or agitation -fair sleep patterns 5. Neuropsych: This patientis notcapable of making decisions on herown behalf. 6. Skin/Wound Care:Routine skin checks 7. Fluids/Electrolytes/Nutrition:encourage PO  8. Chronic hypotension. Patient had been on ProAmatine 10 mg 3 times a day as well as Florinef prior to admission. Resume as neededpendng tolerance of therapy Vitals:   12/17/18 0506 12/17/18 0839  BP: 93/63   Pulse: 69   Resp: 15   Temp: 97.8 F (36.6 C)   SpO2: 100% 99%   Asymptomatic hypotension on 3/12 9.COPD with history of tobacco abuse. Patient on 2 L oxygen at home.Continue chronic prednisone. Follow-up pulmonary services Dr.Wert.Continue nebulizersas directed  -now with #4 cuffless with plug, tolerating fairly well, but I have concerns over her effort when she was with SLP/eating this morning---continue to observe  - decannulation per CCM 10. Seizure disorder. ContinueDepakoteas prior to admission.. EEG negative No clinical seizure activity 11.Hypothyroidism. Synthroid 12. Dysphagia. Diet upgraded to D3 thins,  tolerating well and eating more 13. Polysubstance abuse. UDS positive for cocaine. Provide counseling 14. History of breast cancer with lumpectomy. Patient on Arimidex 1 mg daily prior to admission.OK to restart per Dr Burr Medico Heme/Onc also Dr Burr Medico wants pt to f/u since she hasn't been seen since 2018 15.  Leukocytosis with chronic steroid use  Afebrile  WBCs 8.4 3/9 16.  Medication compliance an ongoing issue. Daily ed by staff.  17. Labile blood glucose   controlled on 3/11  Continue to monitor for trend 18.  Acute blood loss anemia  Hemoglobin 11.0 3.9  Continue to monitor  ELOS: 20 days A FACE TO FACE EVALUATION WAS PERFORMED  Meredith Staggers 12/17/2018, 10:57 AM

## 2018-12-17 NOTE — Progress Notes (Signed)
Missed 30 min of OT on 3/11 due to refusal.

## 2018-12-17 NOTE — Progress Notes (Signed)
Pt continues to refuse medications except for blood pressure medications. Pt is still using 2L O2 [place] at this time but has not had to be suctioned in the last 24 hours. Pt continues to have weak cough.

## 2018-12-17 NOTE — Progress Notes (Signed)
Speech Language Pathology Daily Session Note  Patient Details  Name: Courtney Terry MRN: [id number] Date of Birth: June 15, 1961  Today's Date: 12/17/2018 SLP Individual Time: 0730-0800 SLP Individual Time Calculation (min): 30 min  Short Term Goals: Week 3: SLP Short Term Goal 1 (Week 3): Patient will consume dysphagia 3 with thin liquids with minimal overt s/s of aspiration and Min A verbal cues for use of swallowing compensatory strategies.  SLP Short Term Goal 2 (Week 3): Pt will demonstrate selective attention to tasks for 30 minutes in midlly distracting environment with Min A cues.  SLP Short Term Goal 3 (Week 3): Given Mod A cues, pt will increase vocal intensity to achieve ~ 75% intelligibility at the phrase level in 8 out of 10 opportunities.   SLP Short Term Goal 4 (Week 3): Pt will complete basic problem solving tasks with Mod A cues.  SLP Short Term Goal 5 (Week 3): Pt will donn and doff PMSV with Min A cues in 8 out of 10 opportunities.   Skilled Therapeutic Interventions:  Pt received with trach plugged and O2 via [place]. Pt with multiple questions about going home. Pt able to achieve speech intelligibility of ~ 95% at sentence level in moderatley noisy environment. SLP facilitated session by aiding in transfer to wheelchair and skilled observation of pt consuming dysphagia 3 breakfast tray with thin liquids via straw. Pt with fast rate of consumption but effective oral clearing. Pt continues with increased WOB when participating in transfer, supporting self in wheelchair, consuming PO intake and intermittently talking during 30 minute session. Pt's O2 sats remained ~ 98% but WOB with significant increase over course of multiple consecutive tasks. Despite Max A encouragement, pt unwilling to remain upright in wheelchair for 30 minutes after session. Pt and friend [name] with increased arguing during session. Pt exhibited weak cough during transfer.  Pt left upright in bed, bed alarm on and  all needs within reach. Continue per current plan of care.      Pain Pain Assessment Pain Scale: 0-10 Pain Score: 0-No pain  Therapy/Group: Individual Therapy  Soriya Worster 12/17/2018, 11:04 AM

## 2018-12-18 ENCOUNTER — Inpatient Hospital Stay (HOSPITAL_COMMUNITY): Payer: Medicaid Other | Admitting: Physical Therapy

## 2018-12-18 ENCOUNTER — Inpatient Hospital Stay (HOSPITAL_COMMUNITY): Payer: Medicaid Other | Admitting: Occupational Therapy

## 2018-12-18 ENCOUNTER — Inpatient Hospital Stay (HOSPITAL_COMMUNITY): Payer: Medicaid Other | Admitting: Speech Pathology

## 2018-12-18 DIAGNOSIS — J9611 Chronic respiratory failure with hypoxia: Secondary | ICD-10-CM

## 2018-12-18 LAB — GLUCOSE, CAPILLARY
Glucose-Capillary: 75 mg/dL (ref 70–99)
Glucose-Capillary: 101 mg/dL — ABNORMAL HIGH (ref 70–99)
Glucose-Capillary: 91 mg/dL (ref 70–99)
Glucose-Capillary: 107 mg/dL — ABNORMAL HIGH (ref 70–99)

## 2018-12-18 MED ORDER — DIVALPROEX SODIUM ER 500 MG PO TB24
500.0000 mg | ORAL_TABLET | Freq: Every day | ORAL | Status: DC
  Administered 2018-12-18 – 2018-12-19 (×2): 500 mg via ORAL
  Filled 2018-12-18 (×7): qty 1

## 2018-12-18 NOTE — Progress Notes (Signed)
RT removed patient's trach without complications. Vitals stable throughout. Patient tolerated well. RT will continue to monitor.

## 2018-12-18 NOTE — Progress Notes (Signed)
RT checked on patient. Patient on 2lnc. Patient tolerating decannulation well. RT will continue to monitor.

## 2018-12-18 NOTE — Progress Notes (Signed)
[place] PHYSICAL MEDICINE & REHABILITATION PROGRESS NOTE   Subjective/Complaints: Pt sitting in chair waiting for therapy. States that "I am ready to go home". Feels that she's getting stronger.   ROS: Patient denies fever, rash, sore throat, blurred vision, nausea, vomiting, diarrhea, cough, shortness of breath or chest pain, joint or back pain, headache, or mood change.   Objective:   No results found. No results for input(s): WBC, HGB, HCT, PLT in the last 72 hours. No results for input(s): NA, K, CL, CO2, GLUCOSE, BUN, CREATININE, CALCIUM in the last 72 hours.  Intake/Output Summary (Last 24 hours) at 12/18/2018 0921 Last data filed at 12/18/2018 0847 Gross per 24 hour  Intake 600 ml  Output -  Net 600 ml     Physical Exam: Vital Signs Blood pressure 99/66, pulse 80, temperature 98.1 F (36.7 C), temperature source Oral, resp. rate 14, height 5\' 10"  (1.778 m), weight 64.2 kg, SpO2 99 %. Constitutional: No distress . Vital signs reviewed. HEENT: EOMI, oral membranes moist Neck: supple Cardiovascular: RRR without murmur. No JVD    Respiratory: CTA Bilaterally without wheezes or rales. Normal effort    GI: BS +, non-tender, non-distended  Musc: No edema or tenderness in extremities. Skin: No evidence of breakdown, no evidence of rash Neurologic: Alert Motor: Grossly 4/5 all 4's Psych: in good spirits   Assessment/Plan: 1. Functional deficits secondary to debilitiy from respiratory failure which require interdisciplinary therapy in a comprehensive inpatient rehab setting.  Physiatrist is providing close team supervision and 24 hour management of active medical problems listed below.  Physiatrist and rehab team continue to assess barriers to discharge/monitor patient progress toward functional and medical goals  Care Tool:  Bathing  Bathing activity did not occur: Refused Body parts bathed by patient: Right arm, Left arm, Chest, Abdomen, Right upper leg, Left  upper leg, Face   Body parts bathed by helper: Front perineal area, Buttocks, Left lower leg, Right lower leg     Bathing assist Assist Level: Moderate Assistance - Patient 50 - 74%     Upper Body Dressing/Undressing Upper body dressing Upper body dressing/undressing activity did not occur (including orthotics): Refused What is the patient wearing?: Pull over shirt    Upper body assist Assist Level: Moderate Assistance - Patient 50 - 74%    Lower Body Dressing/Undressing Lower body dressing    Lower body dressing activity did not occur: Refused What is the patient wearing?: Pants     Lower body assist Assist for lower body dressing: Maximal Assistance - Patient 25 - 49%     Toileting Toileting Toileting Activity did not occur Landscape architect and hygiene only): Refused  Toileting assist Assist for toileting: Maximal Assistance - Patient 25 - 49%     Transfers Chair/bed transfer  Transfers assist     Chair/bed transfer assist level: Contact Guard/Touching assist     Locomotion Ambulation   Ambulation assist   Ambulation activity did not occur: Safety/medical concerns  Assist level: Minimal Assistance - Patient > 75% Assistive device: Hand held assist Max distance: 35'   Walk 10 feet activity   Assist  Walk 10 feet activity did not occur: Refused  Assist level: Contact Guard/Touching assist Assistive device: Parallel bars   Walk 50 feet activity   Assist Walk 50 feet with 2 turns activity did not occur: Safety/medical concerns         Walk 150 feet activity   Assist Walk 150 feet activity did not occur: Safety/medical concerns  Walk 10 feet on uneven surface  activity   Assist Walk 10 feet on uneven surfaces activity did not occur: Safety/medical concerns         Wheelchair     Assist   Type of Wheelchair: Manual Wheelchair activity did not occur: Refused  Wheelchair assist level: Supervision/Verbal cueing Max  wheelchair distance: 100'    Wheelchair 50 feet with 2 turns activity    Assist    Wheelchair 50 feet with 2 turns activity did not occur: Refused   Assist Level: Supervision/Verbal cueing   Wheelchair 150 feet activity     Assist Wheelchair 150 feet activity did not occur: Safety/medical concerns   Assist Level: Supervision/Verbal cueing    Medical Problem List and Plan: 1.Debility and metabolic encephalopathysecondary to acute on chronic respiratory failure status post tracheostomy 11/19/2018.Pulmonary services to follow-up on plan decannulation  Continue CIR 15/7 therapies  -team provides ego support  -ongoing cognitive deficits 2.  DVT prophyllaxis Subcutaneous Lovenox 3. Pain Management:Tylenol as needed  4. Mood/bipolar disorder:Prozac 40 mg daily for mood stabilization, Klonopin 0.5 mg 3 times a day as needed for anxiety or agitation -fair sleep patterns at present 5. Neuropsych: This patientis notcapable of making decisions on herown behalf. 6. Skin/Wound Care:Routine skin checks 7. Fluids/Electrolytes/Nutrition:encourage PO  8. Chronic hypotension. Patient had been on ProAmatine 10 mg 3 times a day as well as Florinef prior to admission. Resume as neededpendng tolerance of therapy Vitals:   12/18/18 0848 12/18/18 0903  BP:    Pulse:  80  Resp:  14  Temp:    SpO2: 99% 99%   Asymptomatic hypotension on 3/13 9.COPD with history of tobacco abuse. Patient on 2 L oxygen at home.Continue chronic prednisone. Follow-up pulmonary services Dr.Wert.Continue nebulizersas directed  -#4 trach with cork. Tolerating ok. Seems to use a lot of accessory muscles to breathe when speaking or eating.   -decannulate per CCM 10. Seizure disorder. ContinueDepakoteas prior to admission--change to depakote ER 500mg  daily to improve compliance.. EEG negative No clinical seizure activity at present 11.Hypothyroidism. Synthroid 12. Dysphagia. Diet  upgraded to D3 thins, tolerating well and eating more 13. Polysubstance abuse. UDS positive for cocaine. Provide counseling 14. History of breast cancer with lumpectomy. Patient on Arimidex 1 mg daily prior to admission.OK to restart per Dr Burr Medico Heme/Onc also Dr Burr Medico wants pt to f/u since she hasn't been seen since 2018 15.  Leukocytosis with chronic steroid use  Afebrile  WBCs 8.4 3/9 16.  Medication compliance an ongoing issue. Daily ed by staff.  17. Labile blood glucose   controlled on 3/12  Continue to monitor for trend 18.  Acute blood loss anemia  Hemoglobin 11.0 3.9  Continue to monitor  ELOS: 21 days A FACE TO FACE EVALUATION WAS PERFORMED  Meredith Staggers 12/18/2018, 9:21 AM

## 2018-12-18 NOTE — Progress Notes (Signed)
Occupational Therapy Session Note  Patient Details  Name: Courtney Terry MRN: [id number] Date of Birth: Dec 13, 1960  Today's Date: 12/18/2018 OT Individual Time: 1023-1109 OT Individual Time Calculation (min): 46 min   Short Term Goals: Week 2:  OT Short Term Goal 1 (Week 2): STGs=LTGs due to ELOS OT Short Term Goal 1 - Progress (Week 2): Not met Week 3:  OT Short Term Goal 1 (Week 3): Pt will complete 1 grooming task while sitting or standing at sink to increase OOB tolerance  OT Short Term Goal 2 (Week 3): Pt will complete BSC or toilet transfer with supervision   Skilled Therapeutic Interventions/Progress Updates:    Pt greeted in bed. Required encouragement to participate. Max encouragement required for sitting up to eat ice cream, as pt initially wanted to eat in bed, agitated when told she needed to sit up for safe swallowing. Supine<sit completed with supervision and she resumed eating her ice cream EOB. Per pt, buttocks pain was ok for therapy participation. 02 sats 95% on 3L via nasal canula. Stand step transfer<w/c completed with supervision. She declined to ambulate down hallway. Required encouragement to self propel w/c vs having [name] escort her. Pt self propelled w/c to supply closet to retrieve new socks for room. When in dayroom, pt doffed old socks and donned new ones while utilizing seated figure 4 with setup. Pt ate 2 snacks approved by SLP while sitting up in w/c. 02 sats 100% at this time. Afterwards she ambulated without device approx 55 ft with supervision assist. 02 sats 100% after and no c/o SOB from pt. Afterwards pt self propelled back to room and transferred back to bed via stand pivot. Pt returned to bed per request and transitioned to supine. Pt left with all needs, 4 bed rails up, and bed alarm set. 02 sats 93%. Tx focus placed on active therapy participation, OOB tolerance, functional ambulation, and dynamic standing balance.   Therapy Documentation Precautions:   Precautions Precautions: Fall Precaution Comments: Trach collar, supplemental 02 Restrictions Weight Bearing Restrictions: No Vital Signs: Therapy Vitals Pulse Rate: 80 Resp: 14 Oxygen Therapy SpO2: 99 % O2 Device: Nasal Cannula O2 Flow Rate (L/min): 2 L/min ADL: ADL Eating: Not assessed Grooming: Supervision/safety Where Assessed-Grooming: Edge of bed Upper Body Bathing: Minimal assistance(for thoroughness) Where Assessed-Upper Body Bathing: Edge of bed Lower Body Bathing: Maximal assistance Where Assessed-Lower Body Bathing: Bed level, Edge of bed Upper Body Dressing: Maximal assistance Where Assessed-Upper Body Dressing: Edge of bed Lower Body Dressing: Maximal assistance Where Assessed-Lower Body Dressing: Edge of bed Toileting: Not assessed Toilet Transfer: Moderate assistance Toilet Transfer Method: Stand pivot, Squat pivot Toilet Transfer Equipment: Bedside commode, Drop arm bedside commode Tub/Shower Transfer: Not assessed      Therapy/Group: Individual Therapy  Jacksyn Beeks A Orma Cheetham 12/18/2018, 12:44 PM

## 2018-12-18 NOTE — Progress Notes (Signed)
Pt has been refusing Depakote. The current order is in for Depakote ER formulation TID. D/w Dr. Naaman Plummer and we will change it to the ER formulation and reduce the dose to 500mg . Hopefully, she takes it.  Change Depakote to 500mg  ER PO qday  Onnie Boer, PharmD, [place], AAHIVP, CPP Infectious Disease Pharmacist 12/18/2018 8:56 AM

## 2018-12-18 NOTE — Progress Notes (Addendum)
Speech Language Pathology Weekly Progress and Session Note  Patient Details  Name: NAELA NODAL MRN: [id number] Date of Birth: 03-09-1961  Beginning of progress report period: December 11, 2018 End of progress report period: December 18, 2018  Today's Date: 12/18/2018 SLP Individual Time: 9211-9417 SLP Individual Time Calculation (min): 45 min  Short Term Goals: Week 3: SLP Short Term Goal 1 (Week 3): Patient will consume dysphagia 3 with thin liquids with minimal overt s/s of aspiration and Min A verbal cues for use of swallowing compensatory strategies.  SLP Short Term Goal 1 - Progress (Week 3): Met SLP Short Term Goal 2 (Week 3): Pt will demonstrate selective attention to tasks for 30 minutes in midlly distracting environment with Min A cues.  SLP Short Term Goal 2 - Progress (Week 3): Met SLP Short Term Goal 3 (Week 3): Given Mod A cues, pt will increase vocal intensity to achieve ~ 75% intelligibility at the phrase level in 8 out of 10 opportunities.   SLP Short Term Goal 3 - Progress (Week 3): Met SLP Short Term Goal 4 (Week 3): Pt will complete basic problem solving tasks with Mod A cues.  SLP Short Term Goal 4 - Progress (Week 3): Met SLP Short Term Goal 5 (Week 3): Pt will donn and doff PMSV with Min A cues in 8 out of 10 opportunities.  SLP Short Term Goal 5 - Progress (Week 3): Met    New Short Term Goals: Week 4: SLP Short Term Goal 1 (Week 4): Patient will consume dysphagia 3 with thin liquids with minimal overt s/s of aspiration and supervision cues for use of swallowing compensatory strategies.  SLP Short Term Goal 2 (Week 4): Pt will demonstrate selective attention to tasks for 30 minutes in mildly distracting environment with supervision cues.  SLP Short Term Goal 3 (Week 4): Given Min A cues, pt will increase vocal intensity to achieve ~ 75% intelligibility at the simple conversation level.  SLP Short Term Goal 4 (Week 4): Pt will complete basic problem solving tasks with  Min A cues.  SLP Short Term Goal 5 (Week 4): Pt will demonstrate intellectual awareness by answering yes/no questions regarding currently abilities to increase safety awareness with ~ 75% accuracy and Mod A cues.   Weekly Progress Updates: Pt has made progress towards ST goals throughout this reporting period largely because sessions have focused on increasing selection of foods that pt consumes. Pt is also progressing towards decannulation as her trach is currently capped x 2 days. Pt with little awareness of need to participate in therapies as she doesn't comprehend current situation/deficits. Skilled ST continues to be indicated to assess safety with PO intake and to provide education targeting safety awareness. Pt's PLOF is likely impacting desire to make progress.      Intensity: Minumum of 1-2 x/day, 30 to 90 minutes Frequency: 3 to 5 out of 7 days Duration/Length of Stay: pending SNF placement Treatment/Interventions: Cognitive remediation/compensation;Environmental controls;Internal/external aids;Speech/Language facilitation;Therapeutic Activities;Patient/family education;Functional tasks;Dysphagia/aspiration precaution training;Cueing hierarchy   Daily Session  Skilled Therapeutic Interventions: Skilled treatment session focused on dysphagia nd cognition goals. SLP faciltiated session by providing CGA for transfer from bed to wheelchair. Pt's trach continues to be capped with O2 remaining WFL throughout session. SLP further facilitated session by providing skilled observation of pt consuming dysphagia 3 breakfast with thin liquids. Pt with no increased WOB during consumption but demonstrated throat clears x 2 throughout consumption. Pt continues to request a candy sucker. SLP observed pt consuming  sucker. Pt with increased WOB when breathing only thru nares. O2 sats remained steady but WOB of breathing increased. Pt can be allowed a sucker if she is fully supervised by therapy or nursing.  Pt  requires Max A education/cues for safety awareness. She continues to discuss going home but lack awareness of medical condition. Extensive education provided on concern regarding increased WOB during transfers with this Probation officer. Pt needs to participate in OT/PT to gain respiratory endurance. No evidence of understanding or desire to participate. Pt left upright in bed, bed alarm on and all needs within reach. Continue per current plan of care.     General    Pain Pain Assessment Pain Scale: 0-10 Pain Score: 0-No pain  Therapy/Group: Individual Therapy  Draper Gallon 12/18/2018, 8:39 AM

## 2018-12-18 NOTE — Progress Notes (Addendum)
NAME:  Courtney Terry, MRN:  [id number], DOB:  11/03/1960, LOS: 21 ADMISSION DATE:  11/27/2018, CONSULTATION DATE:  11/08/18 REFERRING MD:  Karmen Bongo, MD, CHIEF COMPLAINT:  Respiratory failure    Brief History   58 year old female with COPD (FEV1 25%) and chronic hypoxemic respiratory failure. Admitted 2/2-2/21 with acute on chronic hypoxemic and hypercarbic respiratory failure requiring intubation. UDS positive for cocaine. Tracheostomy placement on 2/13. 2/21 Admitted to [facility].  PCCM asked to consult 2/27 for tracheostomy care.   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 HITT negative.  2/16 Sedate, gtt's being weaned off, on ATC during day but then required MV for resp distress 2/21 IP Rehab  3/4: Downsized to size 4 cuffless tracheostomy 3/9 increasing PMV goals to 24/7.  3/11: Initiating capping trials 3/13 decannulate Consults:  PCCM   Procedures:  ETT 2/2 >> 2/13 Trach 2/13 >> R PICC 2/13 > 2/26   Significant Diagnostic Tests:  CTA 2/2: Neg for PE, emphysema, multiple pulmonary nodules, ETT in place CT head w/o contrast 2/2: stable/normal EEG 2/3: moderate global encephalopathy,  No epileptiform activities  UDS 2/2 > cocaine  Micro Data:  Resp PCR 2/2 > negative  UC 2/2 > 100k corynebacterium  Sputum 2/2 > normal flora  BCx2 2/2 > negative  Cervical Swab 2/14 > trichomonas   Antimicrobials:  Azithromycin 2/2 > 2/6 Ceftriaxone 2/3 >2/7 Flagyl 2/14 >off  Interim history/subjective:  Doing well with occlusive cap  Objective   Blood pressure 99/66, pulse 80, temperature 98.1 F (36.7 C), temperature source Oral, resp. rate 14, height 5\' 10"  (1.778 m), weight 64.2 kg, SpO2 99 %.     FiO2 (%):  [28 %] 28 %   Intake/Output Summary (Last 24 hours) at 12/18/2018 1103 Last data filed at 12/18/2018 0847 Gross per 24 hour  Intake 600 ml  Output no documentation  Net 600 ml   Filed Weights   12/12/18 0520 12/15/18 0526 12/16/18 0430  Weight: 64.4 kg 64.2 kg 64.2 kg   EXAM: General: This is a pleasant 58 year old female patient she is sitting up in chair and currently participating in rehabilitation services HEENT normocephalic atraumatic.  She has the occlusive tracheostomy In place her phonation is strong her cough is strong Pulmonary: Clear to auscultation diminished bases no accessory use Cardiac: Regular rate and rhythm without murmur rub or gallop Abdomen: Soft, not tender, no organomegaly Extremities: Warm and dry brisk capillary refill Neuro: Awake oriented no focal deficits  Resolved Hospital Problem list     Assessment & Plan:   Acute on Chronic Hypoxemic and Hypercarbic Respiratory Failure in setting of severe COPD (FEV1 25%), s/p tracheostomy sedated Trach status  Discussion She is done well with capping trials.  She does have exertional dyspnea, but I think this is better explained by her FEV1 of 25% and has little if any to do with her tracheostomy.  Plan Decannulate today Keep occlusive dressing on for 48 hours then change with regular Band-Aid daily I provided her with my card, if the tracheostomy stoma does not completely closed in 2 weeks she will need ENT referral  Erick Colace ACNP-BC [place] Pager # [phone] OR # [phone] if no answer  Attending Note:  58  year old female with PMH of COPD who presented with a COPD exacerbation and respiratory failure that required a tracheostomy.  Patient has been steadily improving and has been capped since Wednesday and has dione well.  On exam, lungs are clear.  I reviewed CXR myself, no acute disease noted.  Discussed with PCCM-NP.    Chronic respiratory failure:  - Monitor  for airway protection post extubation  Hypoxemia:  - Titrate O2 for sat of 88-92%  Trach status:  - Decannulate  PCCM will sign off, please call back if needed.  Patient seen and examined, agree with above note.  I dictated the care and orders written for this patient under my direction.  Rush Farmer, [place]

## 2018-12-18 NOTE — Progress Notes (Addendum)
Physical Therapy Session Note  Patient Details  Name: Courtney Terry MRN: [id number] Date of Birth: 1961-02-16  Today's Date: 12/18/2018 PT Individual Time: 1135-1150 PT Individual Time Calculation (min): 15 min   Short Term Goals: Week 2:  PT Short Term Goal 1 (Week 2): =LTG due to ELOS PT Short Term Goal 1 - Progress (Week 2): Progressing toward goal  Skilled Therapeutic Interventions/Progress Updates:  Pt received in bed & agreeable to tx, until respiratory therapist arrived & pt requested to have them remove trach. Therapist returned after respiratory therapist was finished & pt agreeable to tx. Pt on 3L/min supplemental oxygen via nasal cannula & SpO2 WNL throughout session.  Pt transferred supine<>sitting EOB with supervision & bed rails. Pt impulsively transfers to w/c before therapist is able to lock it & despite therapist instruction to sit down as pt does not. Transported pt to dayroom via w/c dependent assist for time management. Pt ambulates 40 ft x 6 without AD & CGA<>Min assist with seated rest breaks in between. Pt requested to weigh herself & stepped on/off scale with UE support & CGA. Pt then requests to go back to her room. Pt left in bed with alarm set & needs at hand.   Pt's friend present throughout session. No c/o pain reported.   Therapy Documentation Precautions:  Precautions Precautions: Fall Precaution Comments: Trach collar, supplemental 02 Restrictions Weight Bearing Restrictions: No General: PT Amount of Missed Time (min): 15 Minutes PT Missed Treatment Reason: Patient unwilling to participate     Therapy/Group: Individual Therapy  Courtney Terry 12/18/2018, 12:04 PM

## 2018-12-18 NOTE — Progress Notes (Signed)
Physical Therapy Note  Patient Details  Name: Courtney Terry MRN: [id number] Date of Birth: 19-Dec-1960 Today's Date: 12/18/2018    Attempted to see patient for scheduled therapy session. Pt declines participation 2/2 pain from having trach removed earlier today. Pt reports she will work twice as hard tomorrow. Pt missed 30 min of scheduled therapy due to refusal.    Excell Seltzer, PT, DPT  12/18/2018, 3:13 PM

## 2018-12-19 ENCOUNTER — Inpatient Hospital Stay (HOSPITAL_COMMUNITY): Payer: Medicaid Other | Admitting: Occupational Therapy

## 2018-12-19 ENCOUNTER — Inpatient Hospital Stay (HOSPITAL_COMMUNITY): Payer: Medicaid Other | Admitting: Speech Pathology

## 2018-12-19 ENCOUNTER — Inpatient Hospital Stay (HOSPITAL_COMMUNITY): Payer: Medicaid Other | Admitting: Physical Therapy

## 2018-12-19 LAB — GLUCOSE, CAPILLARY
Glucose-Capillary: 81 mg/dL (ref 70–99)
Glucose-Capillary: 102 mg/dL — ABNORMAL HIGH (ref 70–99)

## 2018-12-19 NOTE — Progress Notes (Signed)
Physical Therapy Session Note  Patient Details  Name: Courtney Terry MRN: [id number] Date of Birth: 1961-09-11  Today's Date: 12/19/2018 PT Individual Time: 1545-1630 PT Individual Time Calculation (min): 45 min   Short Term Goals: Week 3:  PT Short Term Goal 1 (Week 3): = LTG due to ELOS  Skilled Therapeutic Interventions/Progress Updates:   Pt received supine in bed and agreeable to PT if RN will change stoma dressing. RN notified and changed dressing. Supine>sit transfer with supervision assist and min cues for safety. Stand pivot transfer to [facility] with CGA from Pt for safety. Pt transported to day room in [facility]. Gait training with min A-CGA from PT for safety x 14f. Pt noted to have inconsistent step width and poor corrective reactions, but no near falls. Pt performed nustep reciprocal endurance training x 5.5 minutes, level 3. SpO2 monitored throughout at 100% on 2L/min O2. [facility] heavy use of arms on arm rests. WC mobility training back to room x 1238f And supervision assist for turning technique and doorway management. Pt returned to room and performed stand pivot transfer to bed with CGA. Sit>supine completed with supervision assist, and left supine in bed with call bell in reach and all needs met.        Therapy Documentation Precautions:  Precautions Precautions: Fall Precaution Comments: Trach collar, supplemental 02 Restrictions Weight Bearing Restrictions: No    Vital Signs: Therapy Vitals Temp: 99.8 F (37.7 C) Temp Source: Oral Pulse Rate: 77 Resp: 16 BP: 108/67 Patient Position (if appropriate): Lying Oxygen Therapy O2 Device: Room Air Pain:   denies   Therapy/Group: Individual Therapy  AuLorie Phenix/14/2020, 5:32 PM

## 2018-12-19 NOTE — Progress Notes (Signed)
Occupational Therapy Note  Patient Details  Name: HILDY NICHOLL MRN: [id number] Date of Birth: [birth year]  Today's Date: 12/19/2018 OT Missed Time: 42 Minutes Missed Time Reason: Patient unwilling/refused to participate without medical reason;Patient ill (comment)  Pt missed 30 mins scheduled OT treatment session due to refusal.  Pt asleep upon arrival and when awakened, pt reports not feeling well.  Encouraged pt to continue to engage in functional mobility to increase activity tolerance and endurance, however pt continued to refuse. Pt pulled covers back over self and turned away from therapist.  Notified RN of pt refusal.   [name] 12/19/2018, 9:56 AM

## 2018-12-19 NOTE — Progress Notes (Signed)
[place] PHYSICAL MEDICINE & REHABILITATION PROGRESS NOTE   Subjective/Complaints: Pt refuses multiple interventions per usual. No issues after decannulation  ROS: Patient denies fever, rash, sore throat, blurred vision, nausea, vomiting, diarrhea, cough, shortness of breath or chest pain, joint or back pain, headache, or mood change.   Objective:   No results found. No results for input(s): WBC, HGB, HCT, PLT in the last 72 hours. No results for input(s): NA, K, CL, CO2, GLUCOSE, BUN, CREATININE, CALCIUM in the last 72 hours.  Intake/Output Summary (Last 24 hours) at 12/19/2018 1054 Last data filed at 12/19/2018 0746 Gross per 24 hour  Intake 720 ml  Output -  Net 720 ml     Physical Exam: Vital Signs Blood pressure (!) 89/59, pulse 74, temperature 98.2 F (36.8 C), temperature source Oral, resp. rate 14, height 5\' 10"  (1.778 m), weight 64.2 kg, SpO2 100 %. Constitutional: No distress . Vital signs reviewed. HEENT: EOMI, oral membranes moist Neck: supple Cardiovascular: RRR without murmur. No JVD    Respiratory: CTA Bilaterally without wheezes or rales. Normal effort    GI: BS +, non-tender, non-distended  Musc: No edema or tenderness in extremities. Skin: No evidence of breakdown, no evidence of rash Neurologic: Alert Motor: Grossly 4/5 all 4's Psych: in good spirits   Assessment/Plan: 1. Functional deficits secondary to debilitiy from respiratory failure which require interdisciplinary therapy in a comprehensive inpatient rehab setting.  Physiatrist is providing close team supervision and 24 hour management of active medical problems listed below.  Physiatrist and rehab team continue to assess barriers to discharge/monitor patient progress toward functional and medical goals  Care Tool:  Bathing  Bathing activity did not occur: Refused Body parts bathed by patient: Right arm, Left arm, Chest, Abdomen, Right upper leg, Left upper leg, Face   Body parts bathed  by helper: Front perineal area, Buttocks, Left lower leg, Right lower leg     Bathing assist Assist Level: Moderate Assistance - Patient 50 - 74%     Upper Body Dressing/Undressing Upper body dressing Upper body dressing/undressing activity did not occur (including orthotics): Refused What is the patient wearing?: Pull over shirt    Upper body assist Assist Level: Moderate Assistance - Patient 50 - 74%    Lower Body Dressing/Undressing Lower body dressing    Lower body dressing activity did not occur: Refused What is the patient wearing?: Pants     Lower body assist Assist for lower body dressing: Maximal Assistance - Patient 25 - 49%     Toileting Toileting Toileting Activity did not occur Landscape architect and hygiene only): Refused  Toileting assist Assist for toileting: Maximal Assistance - Patient 25 - 49%     Transfers Chair/bed transfer  Transfers assist     Chair/bed transfer assist level: Contact Guard/Touching assist     Locomotion Ambulation   Ambulation assist   Ambulation activity did not occur: Safety/medical concerns  Assist level: Minimal Assistance - Patient > 75% Assistive device: Hand held assist Max distance: 40 ft   Walk 10 feet activity   Assist  Walk 10 feet activity did not occur: Refused  Assist level: Minimal Assistance - Patient > 75% Assistive device: Parallel bars   Walk 50 feet activity   Assist Walk 50 feet with 2 turns activity did not occur: Safety/medical concerns         Walk 150 feet activity   Assist Walk 150 feet activity did not occur: Safety/medical concerns  Walk 10 feet on uneven surface  activity   Assist Walk 10 feet on uneven surfaces activity did not occur: Safety/medical concerns         Wheelchair     Assist   Type of Wheelchair: Manual Wheelchair activity did not occur: Refused  Wheelchair assist level: Supervision/Verbal cueing Max wheelchair distance: 100'     Wheelchair 50 feet with 2 turns activity    Assist    Wheelchair 50 feet with 2 turns activity did not occur: Refused   Assist Level: Supervision/Verbal cueing   Wheelchair 150 feet activity     Assist Wheelchair 150 feet activity did not occur: Safety/medical concerns   Assist Level: Supervision/Verbal cueing    Medical Problem List and Plan: 1.Debility and metabolic encephalopathysecondary to acute on chronic respiratory failure status post tracheostomy 11/19/2018.Pulmonary services to follow-up on plan decannulation  Continue CIR 15/7 therapies   -ongoing cognitive deficits 2.  DVT prophyllaxis Subcutaneous Lovenox 3. Pain Management:Tylenol as needed  4. Mood/bipolar disorder:Prozac 40 mg daily for mood stabilization, Klonopin 0.5 mg 3 times a day as needed for anxiety or agitation -fair sleep patterns at present 5. Neuropsych: This patientis notcapable of making decisions on herown behalf. 6. Skin/Wound Care:Routine skin checks 7. Fluids/Electrolytes/Nutrition:encourage PO  8. Chronic hypotension. Patient had been on ProAmatine 10 mg 3 times a day as well as Florinef prior to admission. Resume as neededpendng tolerance of therapy Vitals:   12/19/18 0558 12/19/18 0751  BP: (!) 89/59   Pulse: 74   Resp: 14   Temp: 98.2 F (36.8 C)   SpO2: 100% 100%   Asymptomatic hypotension on 3/14 9.COPD with history of tobacco abuse. Patient on 2 L oxygen at home.Continue chronic prednisone. Follow-up pulmonary services Dr.Wert.Continue nebulizersas directed  -decannulated without issues yesterday 10. Seizure disorder. ContinueDepakoteas prior to admission--change to depakote ER 500mg  daily to improve compliance.. EEG negative No clinical seizure activity at present 11.Hypothyroidism. Synthroid 12. Dysphagia. Diet upgraded to D3 thins, tolerating well and eating more 13. Polysubstance abuse. UDS positive for cocaine. Provide counseling 14.  History of breast cancer with lumpectomy. Patient on Arimidex 1 mg daily prior to admission.OK to restart per Dr Burr Medico Heme/Onc also Dr Burr Medico wants pt to f/u since she hasn't been seen since 2018 15.  Leukocytosis with chronic steroid use  Afebrile  WBCs 8.4 3/9 16.  Medication compliance an ongoing issue. Daily ed by staff.  17. Sugars now normal---dc SSI 18.  Acute blood loss anemia  Hemoglobin 11.0 3.9  Continue to monitor  ELOS: 22 days A FACE TO FACE EVALUATION WAS PERFORMED  Meredith Staggers 12/19/2018, 10:54 AM

## 2018-12-19 NOTE — Progress Notes (Signed)
SLP Cancellation Note  Patient Details Name: Courtney Terry MRN: [id number] DOB: 01/08/1961   Cancelled treatment:        Pt refused therapy, therefore she missed 60 minutes of skilled ST. Usually pt is agreeable to ST as she enjoys eating chips/ice cream/sucker but this date she refused. States that she is "just waiting to leave." CSW made aware.                                                                                                 Tristyn Pharris 12/19/2018, 2:42 PM

## 2018-12-20 ENCOUNTER — Inpatient Hospital Stay (HOSPITAL_COMMUNITY): Payer: Medicaid Other | Admitting: Physical Therapy

## 2018-12-20 ENCOUNTER — Inpatient Hospital Stay (HOSPITAL_COMMUNITY): Payer: Medicaid Other | Admitting: Speech Pathology

## 2018-12-20 ENCOUNTER — Inpatient Hospital Stay (HOSPITAL_COMMUNITY): Payer: Medicaid Other | Admitting: Occupational Therapy

## 2018-12-20 LAB — GLUCOSE, CAPILLARY
Glucose-Capillary: 77 mg/dL (ref 70–99)
Glucose-Capillary: 90 mg/dL (ref 70–99)

## 2018-12-20 NOTE — Progress Notes (Signed)
Patient refused morning medications except anastrozole 1 mg tablet and requested to take levothyroxine 75 mcg tablet, which she refused from night shift RN.

## 2018-12-20 NOTE — Progress Notes (Signed)
Occupational Therapy Session Note  Patient Details  Name: Courtney Terry MRN: [id number] Date of Birth: November 12, 1960  Today's Date: 12/20/2018 OT Missed Time: 46 Minutes Missed Time Reason: Pain  Skilled Therapeutic Interventions/Progress Updates:    Pt refused tx due to 8/10 pain in throat. RN made aware of her request for pain medication. 45 minutes missed.   Therapy Documentation Precautions:  Precautions Precautions: Fall Precaution Comments: Trach collar, supplemental 02 Restrictions Weight Bearing Restrictions: No Vital Signs: Therapy Vitals Temp: 98.4 F (36.9 C) Temp Source: Oral Pulse Rate: 74 Resp: 17 BP: 90/63 Patient Position (if appropriate): Lying Oxygen Therapy SpO2: 100 % O2 Device: Nasal Cannula ADL: ADL Eating: Not assessed Grooming: Supervision/safety Where Assessed-Grooming: Edge of bed Upper Body Bathing: Minimal assistance(for thoroughness) Where Assessed-Upper Body Bathing: Edge of bed Lower Body Bathing: Maximal assistance Where Assessed-Lower Body Bathing: Bed level, Edge of bed Upper Body Dressing: Maximal assistance Where Assessed-Upper Body Dressing: Edge of bed Lower Body Dressing: Maximal assistance Where Assessed-Lower Body Dressing: Edge of bed Toileting: Not assessed Toilet Transfer: Moderate assistance Toilet Transfer Method: Stand pivot, Squat pivot Toilet Transfer Equipment: Bedside commode, Drop arm bedside commode Tub/Shower Transfer: Not assessed     Therapy/Group: Individual Therapy  [name] A Vienne Corcoran 12/20/2018, 2:49 PM

## 2018-12-20 NOTE — Progress Notes (Signed)
Physical Therapy Session Note  Patient Details  Name: Courtney Terry MRN: [id number] Date of Birth: 08-Aug-1961    Skilled Therapeutic Interventions/Progress Updates:    pt refused 45 minutes skilled PT due to "I really just don't feel like it right now".  PT offered multiple choices of activity, pt continues to refuse.  Therapy Documentation Precautions:  Precautions Precautions: Fall Precaution Comments: Trach collar, supplemental 02 Restrictions Weight Bearing Restrictions: No General: PT Amount of Missed Time (min): 45 Minutes PT Missed Treatment Reason: Patient unwilling to participate   [name] 12/20/2018, 9:18 AM

## 2018-12-20 NOTE — Progress Notes (Signed)
Pt. Refused Lovenox injection.

## 2018-12-20 NOTE — Progress Notes (Signed)
[place] PHYSICAL MEDICINE & REHABILITATION PROGRESS NOTE   Subjective/Complaints: No new issues. Refusing therapy  ROS: Limited due to cognitive/behavioral   Objective:   No results found. No results for input(s): WBC, HGB, HCT, PLT in the last 72 hours. No results for input(s): NA, K, CL, CO2, GLUCOSE, BUN, CREATININE, CALCIUM in the last 72 hours.  Intake/Output Summary (Last 24 hours) at 12/20/2018 0930 Last data filed at 12/20/2018 0730 Gross per 24 hour  Intake 960 ml  Output -  Net 960 ml     Physical Exam: Vital Signs Blood pressure 98/62, pulse 66, temperature 98.2 F (36.8 C), temperature source Oral, resp. rate 16, height 5\' 10"  (1.778 m), weight 64.2 kg, SpO2 96 %. Constitutional: No distress . Vital signs reviewed. HEENT: EOMI, oral membranes moist Neck: supple Cardiovascular: RRR without murmur. No JVD    Respiratory: CTA Bilaterally without wheezes or rales. Normal effort    GI: BS +, non-tender, non-distended  Musc: No edema or tenderness in extremities. Skin: No evidence of breakdown, no evidence of rash Neurologic: Alert Motor: Grossly 4/5 all 4's Psych: in good spirits   Assessment/Plan: 1. Functional deficits secondary to debilitiy from respiratory failure which require interdisciplinary therapy in a comprehensive inpatient rehab setting.  Physiatrist is providing close team supervision and 24 hour management of active medical problems listed below.  Physiatrist and rehab team continue to assess barriers to discharge/monitor patient progress toward functional and medical goals  Care Tool:  Bathing  Bathing activity did not occur: Refused Body parts bathed by patient: Right arm, Left arm, Chest, Abdomen, Right upper leg, Left upper leg, Face   Body parts bathed by helper: Front perineal area, Buttocks, Left lower leg, Right lower leg     Bathing assist Assist Level: Moderate Assistance - Patient 50 - 74%     Upper Body  Dressing/Undressing Upper body dressing Upper body dressing/undressing activity did not occur (including orthotics): Refused What is the patient wearing?: Pull over shirt    Upper body assist Assist Level: Moderate Assistance - Patient 50 - 74%    Lower Body Dressing/Undressing Lower body dressing    Lower body dressing activity did not occur: Refused What is the patient wearing?: Pants     Lower body assist Assist for lower body dressing: Maximal Assistance - Patient 25 - 49%     Toileting Toileting Toileting Activity did not occur Landscape architect and hygiene only): Refused  Toileting assist Assist for toileting: Maximal Assistance - Patient 25 - 49%     Transfers Chair/bed transfer  Transfers assist     Chair/bed transfer assist level: Contact Guard/Touching assist     Locomotion Ambulation   Ambulation assist   Ambulation activity did not occur: Safety/medical concerns  Assist level: Minimal Assistance - Patient > 75% Assistive device: Hand held assist Max distance: 55ft   Walk 10 feet activity   Assist  Walk 10 feet activity did not occur: Refused  Assist level: Minimal Assistance - Patient > 75% Assistive device: Parallel bars   Walk 50 feet activity   Assist Walk 50 feet with 2 turns activity did not occur: Safety/medical concerns  Assist level: Minimal Assistance - Patient > 75%      Walk 150 feet activity   Assist Walk 150 feet activity did not occur: Safety/medical concerns         Walk 10 feet on uneven surface  activity   Assist Walk 10 feet on uneven surfaces activity did not occur: Safety/medical  concerns         Wheelchair     Assist   Type of Wheelchair: Educational psychologist activity did not occur: Refused  Wheelchair assist level: Supervision/Verbal cueing Max wheelchair distance: 120    Wheelchair 50 feet with 2 turns activity    Assist    Wheelchair 50 feet with 2 turns activity did not occur:  Refused   Assist Level: Supervision/Verbal cueing   Wheelchair 150 feet activity     Assist Wheelchair 150 feet activity did not occur: Safety/medical concerns   Assist Level: Supervision/Verbal cueing    Medical Problem List and Plan: 1.Debility and metabolic encephalopathysecondary to acute on chronic respiratory failure status 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   -need to move to SNF  2.  DVT prophyllaxis Subcutaneous Lovenox 3. Pain Management:Tylenol as needed  4. Mood/bipolar disorder:Prozac 40 mg daily for mood stabilization, Klonopin 0.5 mg 3 times a day as needed for anxiety or agitation -fair sleep patterns at present 5. Neuropsych: This patientis notcapable of making decisions on herown behalf. 6. Skin/Wound Care:Routine skin checks 7. Fluids/Electrolytes/Nutrition:encourage PO  8. Chronic hypotension. Patient had been on ProAmatine 10 mg 3 times a day as well as Florinef prior to admission. Resume as neededpendng tolerance of therapy Vitals:   12/20/18 0501 12/20/18 0908  BP: 98/62   Pulse: 66   Resp: 16   Temp: 98.2 F (36.8 C)   SpO2: 98% 96%   Asymptomatic hypotension on 3/15 9.COPD with history of tobacco abuse. Patient on 2 L oxygen at home.Continue chronic prednisone. Follow-up pulmonary services Dr.Wert.Continue nebulizersas directed  -decannulated without issue  10. Seizure disorder. ContinueDepakoteas prior to admission--change to depakote ER 500mg  daily to improve compliance.. EEG negative No clinical seizure activity at present 11.Hypothyroidism. Synthroid 12. Dysphagia. Diet upgraded to D3 thins, tolerating well and eating more 13. Polysubstance abuse. UDS positive for cocaine. Provide counseling 14. History of breast cancer with lumpectomy. Patient on Arimidex 1 mg daily prior to admission.OK to restart per Dr Burr Medico Heme/Onc  also Dr Burr Medico wants pt to f/u since she hasn't been seen since 2018 15.  Leukocytosis with chronic steroid use  Afebrile  WBCs 8.4 3/9, labs tomorrow 16.  Medication compliance an ongoing issue. Daily ed by staff.  17. Sugars now normal---dc SSI 18.  Acute blood loss anemia  Hemoglobin 11.0 3.9, labs tomorrow  Continue to monitor  ELOS: 23 days A FACE TO FACE EVALUATION WAS PERFORMED  Courtney Terry 12/20/2018, 9:30 AM

## 2018-12-21 ENCOUNTER — Inpatient Hospital Stay (HOSPITAL_COMMUNITY): Payer: Medicaid Other | Admitting: Physical Therapy

## 2018-12-21 ENCOUNTER — Inpatient Hospital Stay (HOSPITAL_COMMUNITY): Payer: Medicaid Other

## 2018-12-21 ENCOUNTER — Inpatient Hospital Stay (HOSPITAL_COMMUNITY): Payer: Medicaid Other | Admitting: Occupational Therapy

## 2018-12-21 LAB — CBC
HCT: 36.4 % (ref 36.0–46.0)
HEMOGLOBIN: 11 g/dL — AB (ref 12.0–15.0)
MCH: 27.9 pg (ref 26.0–34.0)
MCHC: 30.2 g/dL (ref 30.0–36.0)
MCV: 92.4 fL (ref 80.0–100.0)
Platelets: 247 10*3/uL (ref 150–400)
RBC: 3.94 MIL/uL (ref 3.87–5.11)
RDW: 15.4 % (ref 11.5–15.5)
WBC: 11.3 10*3/uL — ABNORMAL HIGH (ref 4.0–10.5)
nRBC: 0 % (ref 0.0–0.2)

## 2018-12-21 LAB — BASIC METABOLIC PANEL
Anion gap: 7 (ref 5–15)
BUN: 17 mg/dL (ref 6–20)
CO2: 31 mmol/L (ref 22–32)
Calcium: 9 mg/dL (ref 8.9–10.3)
Chloride: 101 mmol/L (ref 98–111)
Creatinine, Ser: 0.37 mg/dL — ABNORMAL LOW (ref 0.44–1.00)
GFR calc non Af Amer: >60 mL/min (ref >60)
Glucose, Bld: 87 mg/dL (ref 70–99)
Potassium: 4.2 mmol/L (ref 3.5–5.1)
Sodium: 139 mmol/L (ref 135–145)

## 2018-12-21 NOTE — NC FL2 (Deleted)
[place] LEVEL OF CARE SCREENING TOOL     IDENTIFICATION  Patient Name: Courtney Terry Birthdate: [birth year] Sex: female Admission Date (Current Location): 11/27/2018  [place] and [place] Number:  Kathleen Argue [id number] [place] and Address:  The [place]. [facility], [place] [address], [place], [place] [address]      Provider Number: [id number]  Attending Physician Name and Address:  Meredith Staggers, MD  Relative Name and Phone Number:       Current Level of Care: Other (Comment)(Acute Inpatient Rehab) Recommended Level of Care: [place] Prior Approval Number:    Date Approved/Denied:   PASRR Number: [id number]  Discharge Plan: SNF    Current Diagnoses: Patient Active Problem List   Diagnosis Date Noted  . Acute blood loss anemia   . Leucocytosis   . Labile blood glucose   . Idiopathic hypotension   . Bipolar affective disorder in remission ([place])   . Hypoxemia   . Acute respiratory failure with hypoxemia ([place])   . Tracheostomy care ([place])   . Metabolic encephalopathy 04/05/1600  . Tracheostomy status ([place])   . Acute respiratory failure with hypoxia and hypercapnia ([place]) 11/08/2018  . Severely underweight adult 09/13/2018  . COPD with acute exacerbation ([place]) 09/12/2018  . Syncope 09/12/2018  . Tobacco use disorder 05/01/2018  . Depression 01/26/2018  . CHF (congestive heart failure) ([place]) 11/11/2017  . PID (acute pelvic inflammatory disease) 11/11/2017  . Seizures ([place])   . Hypertension   . Palliative care encounter   . Goals of care, counseling/discussion   . Hypoxia   . Rhinovirus infection 09/30/2016  . Hypothyroidism 09/29/2016  . Bipolar I disorder ([place]) 09/29/2016  . Acute and chronic respiratory failure with hypercapnia ([place]) 09/28/2016  . Polysubstance abuse ([place]) 08/13/2016  . Cocaine abuse ([place]) 08/13/2016  . Schizophrenia ([place]) 04/23/2016  . COPD (chronic obstructive pulmonary disease) ([place]) 04/23/2016   . Chronic respiratory failure with hypoxia ([place]) 03/29/2016  . Acute on chronic respiratory failure ([place]) 02/28/2016  . Protein-calorie malnutrition, severe 02/27/2016  . Elevated troponin   . History of breast cancer 10/20/2015  . Exposure of implanted prstht mtrl to surrnd org/tiss, init 06/19/2015  . Complication of internal breast prosthesis 06/19/2015  . Acquired absence of breast and nipple 06/06/2015  . H/O right mastectomy 06/06/2015  . COPD exacerbation ([place]) 06/29/2014  . COPD GOLD IV D 02/27/2013  . Malignant neoplasm of upper-outer quadrant of female breast ([place]) 01/31/2012  . Epilepsy ([place]) 12/02/2007    Orientation RESPIRATION BLADDER Height & Weight     Self  O2 Incontinent Weight: 141 lb 8.9 oz (64.2 kg) Height:  5\' 10"  (177.8 cm)  BEHAVIORAL SYMPTOMS/MOOD NEUROLOGICAL BOWEL NUTRITION STATUS      Incontinent Diet(Dys 2, thin liquids)  AMBULATORY STATUS COMMUNICATION OF NEEDS Skin   Limited Assist Non-Verbally PU Stage and Appropriate Care, Surgical wounds   PU Stage 2 Dressing: Daily                   Personal Care Assistance Level of Assistance  Bathing, Feeding, Dressing, Total care Bathing Assistance: Maximum assistance Feeding assistance: Limited assistance Dressing Assistance: Maximum assistance Total Care Assistance: Maximum assistance   Functional Limitations Info  Speech     Speech Info: Impaired    SPECIAL CARE FACTORS FREQUENCY  PT (By licensed PT), OT (By licensed OT), Speech therapy     PT Frequency: 5x/wk OT Frequency: 5x/wk     Speech Therapy Frequency: 5x/wk  Contractures Contractures Info: Not present    Additional Factors Info  Code Status, Allergies, Psychotropic, Insulin Sliding Scale Code Status Info: Full Allergies Info: Levaquin Psychotropic Info: see MAR Insulin Sliding Scale Info: prn       Current Medications (12/21/2018):  This is the current hospital active medication list Current Facility-Administered  Medications  Medication Dose Route Frequency Provider Last Rate Last Dose  . acetaminophen (TYLENOL) tablet 650 mg  650 mg Oral Q6H PRN Cathlyn Parsons, PA-C   650 mg at 12/20/18 1454  . albuterol (PROVENTIL) (2.5 MG/3ML) 0.083% nebulizer solution 2.5 mg  2.5 mg Nebulization Q4H PRN Cathlyn Parsons, PA-C   2.5 mg at 12/14/18 1458  . anastrozole (ARIMIDEX) tablet 1 mg  1 mg Oral Daily Kirsteins, Luanna Salk, MD   1 mg at 12/21/18 5621  . arformoterol (BROVANA) nebulizer solution 15 mcg  15 mcg Nebulization BID Cathlyn Parsons, PA-C   15 mcg at 12/21/18 0845  . bisacodyl (DULCOLAX) suppository 10 mg  10 mg Rectal Daily PRN Angiulli, Lavon Paganini, PA-C      . budesonide (PULMICORT) nebulizer solution 0.5 mg  0.5 mg Nebulization BID AngiulliLavon Paganini, PA-C   0.5 mg at 12/21/18 0847  . chlorhexidine (PERIDEX) 0.12 % solution 15 mL  15 mL Mouth Rinse BID Meredith Staggers, MD   15 mL at 12/13/18 2000  . clonazePAM (KLONOPIN) disintegrating tablet 0.5 mg  0.5 mg Per Tube TID PRN Cathlyn Parsons, PA-C   0.5 mg at 12/09/18 1201  . collagenase (SANTYL) ointment   Topical Daily Love, Ivan Anchors, PA-C      . divalproex (DEPAKOTE ER) 24 hr tablet 500 mg  500 mg Oral Daily Meredith Staggers, MD   500 mg at 12/19/18 3086  . enoxaparin (LOVENOX) injection 40 mg  40 mg Subcutaneous Q24H Meredith Staggers, MD   40 mg at 12/19/18 1453  . famotidine (PEPCID) tablet 20 mg  20 mg Oral BID Meredith Staggers, MD   20 mg at 12/14/18 2150  . feeding supplement (ENSURE ENLIVE) (ENSURE ENLIVE) liquid 237 mL  237 mL Oral TID WC Meredith Staggers, MD   237 mL at 12/19/18 1749  . FLUoxetine (PROZAC) capsule 40 mg  40 mg Oral Daily Meredith Staggers, MD   40 mg at 12/16/18 0841  . folic acid (FOLVITE) tablet 1 mg  1 mg Oral Daily Meredith Staggers, MD   1 mg at 12/14/18 0731  . ipratropium-albuterol (DUONEB) 0.5-2.5 (3) MG/3ML nebulizer solution 3 mL  3 mL Nebulization BID Meredith Staggers, MD   3 mL at 12/21/18 0846  .  levothyroxine (SYNTHROID, LEVOTHROID) tablet 75 mcg  75 mcg Oral Q0600 Meredith Staggers, MD   75 mcg at 12/21/18 0810  . MEDLINE mouth rinse  15 mL Mouth Rinse q12n4p Meredith Staggers, MD   15 mL at 12/17/18 1524  . multivitamin with minerals tablet 1 tablet  1 tablet Oral Daily Meredith Staggers, MD   1 tablet at 12/14/18 [phone]  . polyethylene glycol (MIRALAX / GLYCOLAX) packet 17 g  17 g Oral Daily PRN Angiulli, Lavon Paganini, PA-C      . predniSONE (DELTASONE) tablet 10 mg  10 mg Oral Q breakfast Meredith Staggers, MD   10 mg at 12/21/18 0810  . [place]   Oral PRN Meredith Staggers, MD      . thiamine (VITAMIN B-1) tablet 100 mg  100  mg Oral Daily Meredith Staggers, MD   100 mg at 12/14/18 9326     Discharge Medications: Please see discharge summary for a list of discharge medications.  Relevant Imaging Results:  Relevant Lab Results:   Additional Information [id number].   Marshay Slates, LCSW

## 2018-12-21 NOTE — Progress Notes (Signed)
Physical Therapy Session Note  Patient Details  Name: Courtney Terry MRN: [id number] Date of Birth: 1961-03-18  Today's Date: 12/21/2018 PT Individual Time: 1300-1330 PT Individual Time Calculation (min): 30 min   Short Term Goals: Week 3:  PT Short Term Goal 1 (Week 3): = LTG due to ELOS  Skilled Therapeutic Interventions/Progress Updates:    Pt received seated in w/c in room eating pudding under Supervision of NT, this therapist takes over. Pt finishes eating her pudding with Supervision assist. Pt reports she has been incontinent in her brief. Encouraged pt to perform pericare in standing for increased functionality and BLE strengthening, standing tolerance, etc. Pt declines and requests to lay back down for pericare. Stand pivot transfer w/c to bed with CGA. Pt is impulsive and requires assist to maintain balance with transfer but pushes therapist away and declines assist. Sit to supine Supervision. Rolling L/R with Supervision, setup A for pericare and dependent for brief change. RN notified that pt's dressing on her sacrum needs to be changed. Pt declines any further participation in therapy session. Pt missed 15 min of scheduled therapy session due to refusal. Pt left supine in bed with needs in reach.  Therapy Documentation Precautions:  Precautions Precautions: Fall Precaution Comments: Trach collar, supplemental 02 Restrictions Weight Bearing Restrictions: No General: PT Amount of Missed Time (min): 15 Minutes PT Missed Treatment Reason: Patient unwilling to participate    Therapy/Group: Individual Therapy  Excell Seltzer, PT, DPT  12/21/2018, 4:44 PM

## 2018-12-21 NOTE — Progress Notes (Signed)
O2 decreased to 2L/M via [place], keeping sats 93-95%. Bilateral hand tremors. Refused mouthwash and all scheduled meds, past 2 nights. Trach stoma basically healed, cleansed area and foam dressing applied. Patrici Ranks A

## 2018-12-21 NOTE — Progress Notes (Signed)
Center PHYSICAL MEDICINE & REHABILITATION PROGRESS NOTE   Subjective/Complaints: No new complaints  ROS: limited by behavior Objective:   No results found. Recent Labs    12/21/18 0642  WBC 11.3*  HGB 11.0*  HCT 36.4  PLT 247   Recent Labs    12/21/18 0642  NA 139  K 4.2  CL 101  CO2 31  GLUCOSE 87  BUN 17  CREATININE 0.37*  CALCIUM 9.0    Intake/Output Summary (Last 24 hours) at 12/21/2018 0904 Last data filed at 12/21/2018 0800 Gross per 24 hour  Intake 840 ml  Output -  Net 840 ml     Physical Exam: Vital Signs Blood pressure 105/71, pulse 64, temperature 98.3 F (36.8 C), temperature source Oral, resp. rate 18, height 5\' 10"  (1.778 m), weight 64.2 kg, SpO2 100 %. Constitutional: No distress . Vital signs reviewed. HEENT: EOMI, oral membranes moist Neck: supple, trach stoma dressed Cardiovascular: RRR without murmur. No JVD    Respiratory: CTA Bilaterally without wheezes or rales. Normal effort    GI: BS +, non-tender, non-distended  Musc: No edema or tenderness in extremities. Skin: No evidence of breakdown, no evidence of rash Neurologic: Alert Motor: Grossly 4+/5 all 4's Psych: in good spirits   Assessment/Plan: 1. Functional deficits secondary to debilitiy from respiratory failure which require interdisciplinary therapy in a comprehensive inpatient rehab setting.  Physiatrist is providing close team supervision and 24 hour management of active medical problems listed below.  Physiatrist and rehab team continue to assess barriers to discharge/monitor patient progress toward functional and medical goals  Care Tool:  Bathing  Bathing activity did not occur: Refused Body parts bathed by patient: Right arm, Left arm, Chest, Abdomen, Right upper leg, Left upper leg, Face   Body parts bathed by helper: Front perineal area, Buttocks, Left lower leg, Right lower leg     Bathing assist Assist Level: Moderate Assistance - Patient 50 - 74%      Upper Body Dressing/Undressing Upper body dressing Upper body dressing/undressing activity did not occur (including orthotics): Refused What is the patient wearing?: Pull over shirt    Upper body assist Assist Level: Moderate Assistance - Patient 50 - 74%    Lower Body Dressing/Undressing Lower body dressing    Lower body dressing activity did not occur: Refused What is the patient wearing?: Pants     Lower body assist Assist for lower body dressing: Maximal Assistance - Patient 25 - 49%     Toileting Toileting Toileting Activity did not occur Landscape architect and hygiene only): Refused  Toileting assist Assist for toileting: Maximal Assistance - Patient 25 - 49%     Transfers Chair/bed transfer  Transfers assist     Chair/bed transfer assist level: Contact Guard/Touching assist     Locomotion Ambulation   Ambulation assist   Ambulation activity did not occur: Safety/medical concerns  Assist level: Minimal Assistance - Patient > 75% Assistive device: Hand held assist Max distance: 22ft   Walk 10 feet activity   Assist  Walk 10 feet activity did not occur: Refused  Assist level: Minimal Assistance - Patient > 75% Assistive device: Parallel bars   Walk 50 feet activity   Assist Walk 50 feet with 2 turns activity did not occur: Safety/medical concerns  Assist level: Minimal Assistance - Patient > 75%      Walk 150 feet activity   Assist Walk 150 feet activity did not occur: Safety/medical concerns  Walk 10 feet on uneven surface  activity   Assist Walk 10 feet on uneven surfaces activity did not occur: Safety/medical concerns         Wheelchair     Assist   Type of Wheelchair: Manual Wheelchair activity did not occur: Refused  Wheelchair assist level: Supervision/Verbal cueing Max wheelchair distance: 120    Wheelchair 50 feet with 2 turns activity    Assist    Wheelchair 50 feet with 2 turns activity did  not occur: Refused   Assist Level: Supervision/Verbal cueing   Wheelchair 150 feet activity     Assist Wheelchair 150 feet activity did not occur: Safety/medical concerns   Assist Level: Supervision/Verbal cueing    Medical Problem List and Plan: 1.Debility and metabolic encephalopathysecondary to acute on chronic respiratory failure status 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   -pending SNF   -pt is medically stable for discharge 2.  DVT prophyllaxis Subcutaneous Lovenox 3. Pain Management:Tylenol as needed  4. Mood/bipolar disorder:Prozac 40 mg daily for mood stabilization, Klonopin 0.5 mg 3 times a day as needed for anxiety or agitation -fair sleep patterns at present 5. Neuropsych: This patientis notcapable of making decisions on herown behalf. 6. Skin/Wound Care:Routine skin checks 7. Fluids/Electrolytes/Nutrition:encourage PO  -I personally reviewed all of the patient's labs today, and lab work is within normal limits.    8. Chronic hypotension. Patient had been on ProAmatine 10 mg 3 times a day as well as Florinef prior to admission. Resume as neededpendng tolerance of therapy Vitals:   12/21/18 0846 12/21/18 0847  BP:    Pulse:    Resp:    Temp:    SpO2: 100% 100%   Asymptomatic hypotension on 3/16 9.COPD with history of tobacco abuse. Patient on 2 L oxygen at home.Continue chronic prednisone. Follow-up pulmonary services Dr.Wert.Continue nebulizersas directed  -decannulated without issue  10. Seizure disorder. ContinueDepakoteas prior to admission--change to depakote ER 500mg  daily to improve compliance.. EEG negative No clinical seizure activity at present 11.Hypothyroidism. Synthroid 12. Dysphagia. Diet upgraded to D3 thins, tolerating well and eating more 13. Polysubstance abuse. UDS positive for cocaine. Provide counseling 14. History of  breast cancer with lumpectomy. Patient on Arimidex 1 mg daily prior to admission.OK to restart per Dr Burr Medico Heme/Onc also Dr Burr Medico wants pt to f/u since she hasn't been seen since 2018 15.  Leukocytosis with chronic steroid use  Afebrile  WBCs 8.4 3/9, labs tomorrow 16.  Medication compliance an ongoing issue. Daily ed by staff.  17. Sugars now normal---dc SSI 18.  Acute blood loss anemia  Hemoglobin 11.0 3/16  Continue to monitor  ELOS: 24 days A FACE TO FACE EVALUATION WAS PERFORMED  Meredith Staggers 12/21/2018, 9:04 AM

## 2018-12-21 NOTE — NC FL2 (Signed)
[place] LEVEL OF CARE SCREENING TOOL     IDENTIFICATION  Patient Name: Courtney Terry Birthdate: Oct 29, 1960 Sex: female Admission Date (Current Location): 11/27/2018  [place] and [place] Number:  Kathleen Argue [id number] [place] and Address:  [facility] [place]. [facility], [place] [address], [place], [place] [address]      Provider Number: [id number]  Attending Physician Name and Address:  Meredith Staggers, MD  Relative Name and Phone Number:       Current Level of Care: Other (Comment)(Acute Inpatient Rehab) Recommended Level of Care: [place] Prior Approval Number:    Date Approved/Denied:   PASRR Number: [id number]  Discharge Plan: SNF    Current Diagnoses: Patient Active Problem List   Diagnosis Date Noted  . Acute blood loss anemia   . Leucocytosis   . Labile blood glucose   . Idiopathic hypotension   . Bipolar affective disorder in remission ([place])   . Hypoxemia   . Acute respiratory failure with hypoxemia ([place])   . Tracheostomy care ([place])   . Metabolic encephalopathy 78/46/9629  . Tracheostomy status ([place])   . Acute respiratory failure with hypoxia and hypercapnia ([place]) 11/08/2018  . Severely underweight adult 09/13/2018  . COPD with acute exacerbation ([place]) 09/12/2018  . Syncope 09/12/2018  . Tobacco use disorder 05/01/2018  . Depression 01/26/2018  . CHF (congestive heart failure) ([place]) 11/11/2017  . PID (acute pelvic inflammatory disease) 11/11/2017  . Seizures ([place])   . Hypertension   . Palliative care encounter   . Goals of care, counseling/discussion   . Hypoxia   . Rhinovirus infection 09/30/2016  . Hypothyroidism 09/29/2016  . Bipolar I disorder ([place]) 09/29/2016  . Acute and chronic respiratory failure with hypercapnia ([place]) 09/28/2016  . Polysubstance abuse ([place]) 08/13/2016  . Cocaine abuse ([place]) 08/13/2016  . Schizophrenia ([place]) 04/23/2016  . COPD (chronic obstructive pulmonary disease) ([place]) 04/23/2016   . Chronic respiratory failure with hypoxia ([place]) 03/29/2016  . Acute on chronic respiratory failure ([place]) 02/28/2016  . Protein-calorie malnutrition, severe 02/27/2016  . Elevated troponin   . History of breast cancer 10/20/2015  . Exposure of implanted prstht mtrl to surrnd org/tiss, init 06/19/2015  . Complication of internal breast prosthesis 06/19/2015  . Acquired absence of breast and nipple 06/06/2015  . H/O right mastectomy 06/06/2015  . COPD exacerbation ([place]) 06/29/2014  . COPD GOLD IV D 02/27/2013  . Malignant neoplasm of upper-outer quadrant of female breast ([place]) 01/31/2012  . Epilepsy ([place]) 12/02/2007    Orientation RESPIRATION BLADDER Height & Weight     Self  O2 Incontinent Weight: 141 lb 8.9 oz (64.2 kg) Height:  5\' 10"  (177.8 cm)  BEHAVIORAL SYMPTOMS/MOOD NEUROLOGICAL BOWEL NUTRITION STATUS      Incontinent Diet(Dys 2, thin liquids)  AMBULATORY STATUS COMMUNICATION OF NEEDS Skin   Limited Assist Non-Verbally PU Stage and Appropriate Care, Surgical wounds   PU Stage 2 Dressing: Daily                   Personal Care Assistance Level of Assistance  Bathing, Feeding, Dressing, Total care Bathing Assistance: Maximum assistance Feeding assistance: Limited assistance Dressing Assistance: Maximum assistance Total Care Assistance: Maximum assistance   Functional Limitations Info  Speech     Speech Info: Impaired    SPECIAL CARE FACTORS FREQUENCY  PT (By licensed PT), OT (By licensed OT), Speech therapy     PT Frequency: 5x/wk OT Frequency: 5x/wk     Speech Therapy Frequency: 5x/wk  Contractures Contractures Info: Not present    Additional Factors Info  Code Status, Allergies, Psychotropic, Insulin Sliding Scale Code Status Info: Full Allergies Info: Levaquin Psychotropic Info: see MAR Insulin Sliding Scale Info: prn       Current Medications (12/21/2018):  This is the current hospital active medication list Current Facility-Administered  Medications  Medication Dose Route Frequency Provider Last Rate Last Dose  . acetaminophen (TYLENOL) tablet 650 mg  650 mg Oral Q6H PRN Cathlyn Parsons, PA-C   650 mg at 12/20/18 1454  . albuterol (PROVENTIL) (2.5 MG/3ML) 0.083% nebulizer solution 2.5 mg  2.5 mg Nebulization Q4H PRN Cathlyn Parsons, PA-C   2.5 mg at 12/14/18 1458  . anastrozole (ARIMIDEX) tablet 1 mg  1 mg Oral Daily Kirsteins, Luanna Salk, MD   1 mg at 12/21/18 5277  . arformoterol (BROVANA) nebulizer solution 15 mcg  15 mcg Nebulization BID Cathlyn Parsons, PA-C   15 mcg at 12/21/18 0845  . bisacodyl (DULCOLAX) suppository 10 mg  10 mg Rectal Daily PRN Angiulli, Lavon Paganini, PA-C      . budesonide (PULMICORT) nebulizer solution 0.5 mg  0.5 mg Nebulization BID AngiulliLavon Paganini, PA-C   0.5 mg at 12/21/18 0847  . chlorhexidine (PERIDEX) 0.12 % solution 15 mL  15 mL Mouth Rinse BID Meredith Staggers, MD   15 mL at 12/13/18 2000  . clonazePAM (KLONOPIN) disintegrating tablet 0.5 mg  0.5 mg Per Tube TID PRN Cathlyn Parsons, PA-C   0.5 mg at 12/09/18 1201  . collagenase (SANTYL) ointment   Topical Daily Love, Ivan Anchors, PA-C      . divalproex (DEPAKOTE ER) 24 hr tablet 500 mg  500 mg Oral Daily Meredith Staggers, MD   500 mg at 12/19/18 8242  . enoxaparin (LOVENOX) injection 40 mg  40 mg Subcutaneous Q24H Meredith Staggers, MD   40 mg at 12/19/18 1453  . famotidine (PEPCID) tablet 20 mg  20 mg Oral BID Meredith Staggers, MD   20 mg at 12/14/18 2150  . feeding supplement (ENSURE ENLIVE) (ENSURE ENLIVE) liquid 237 mL  237 mL Oral TID WC Meredith Staggers, MD   237 mL at 12/19/18 1749  . FLUoxetine (PROZAC) capsule 40 mg  40 mg Oral Daily Meredith Staggers, MD   40 mg at 12/16/18 0841  . folic acid (FOLVITE) tablet 1 mg  1 mg Oral Daily Meredith Staggers, MD   1 mg at 12/14/18 0731  . ipratropium-albuterol (DUONEB) 0.5-2.5 (3) MG/3ML nebulizer solution 3 mL  3 mL Nebulization BID Meredith Staggers, MD   3 mL at 12/21/18 0846  .  levothyroxine (SYNTHROID, LEVOTHROID) tablet 75 mcg  75 mcg Oral Q0600 Meredith Staggers, MD   75 mcg at 12/21/18 0810  . MEDLINE mouth rinse  15 mL Mouth Rinse q12n4p Meredith Staggers, MD   15 mL at 12/17/18 1524  . multivitamin with minerals tablet 1 tablet  1 tablet Oral Daily Meredith Staggers, MD   1 tablet at 12/14/18 [phone]  . polyethylene glycol (MIRALAX / GLYCOLAX) packet 17 g  17 g Oral Daily PRN Angiulli, Lavon Paganini, PA-C      . predniSONE (DELTASONE) tablet 10 mg  10 mg Oral Q breakfast Meredith Staggers, MD   10 mg at 12/21/18 0810  . [place]   Oral PRN Meredith Staggers, MD      . thiamine (VITAMIN B-1) tablet 100 mg  100  mg Oral Daily Meredith Staggers, MD   100 mg at 12/14/18 1674     Discharge Medications: Please see discharge summary for a list of discharge medications.  Relevant Imaging Results:  Relevant Lab Results:   Additional Information [id number].   Tiandra [place]veland, LCSW

## 2018-12-21 NOTE — Progress Notes (Signed)
Occupational Therapy Session Note  Patient Details  Name: Courtney Terry MRN: [id number] Date of Birth: 1961/09/06  Today's Date: 12/21/2018 OT Individual Time: 0932-6712 OT Individual Time Calculation (min): 10 min  and Today's Date: 12/21/2018 OT Missed Time: 50 Minutes Missed Time Reason: Patient unwilling/refused to participate without medical reason   Short Term Goals: Week 2:  OT Short Term Goal 1 (Week 2): STGs=LTGs due to ELOS OT Short Term Goal 1 - Progress (Week 2): Not met Week 3:  OT Short Term Goal 1 (Week 3): Pt will complete 1 grooming task while sitting or standing at sink to increase OOB tolerance  OT Short Term Goal 2 (Week 3): Pt will complete BSC or toilet transfer with supervision   Skilled Therapeutic Interventions/Progress Updates:    Upon entering the room, pt supine in bed with significant other present in room. Pt initially speaking with therapist but refusing OT intervention with no reason given other than, " waiting on the rest of my food". OT encouraged pt as well as caregiver present in room. However, pt continued to refused all therapeutic intervention at this time. Pt remained in bed with call bell and all needed items within reach upon exiting the room.   Therapy Documentation Precautions:  Precautions Precautions: Fall Precaution Comments: Trach collar, supplemental 02 Restrictions Weight Bearing Restrictions: No General: General OT Amount of Missed Time: 50 Minutes Vital Signs: Therapy Vitals Temp: 98.3 F (36.8 C) Temp Source: Oral Pulse Rate: 64 Resp: 18 BP: 105/71 Patient Position (if appropriate): Lying Oxygen Therapy SpO2: 100 % O2 Device: Nasal Cannula O2 Flow Rate (L/min): 2 L/min Pain:   ADL: ADL Eating: Not assessed Grooming: Supervision/safety Where Assessed-Grooming: Edge of bed Upper Body Bathing: Minimal assistance(for thoroughness) Where Assessed-Upper Body Bathing: Edge of bed Lower Body Bathing: Maximal  assistance Where Assessed-Lower Body Bathing: Bed level, Edge of bed Upper Body Dressing: Maximal assistance Where Assessed-Upper Body Dressing: Edge of bed Lower Body Dressing: Maximal assistance Where Assessed-Lower Body Dressing: Edge of bed Toileting: Not assessed Toilet Transfer: Moderate assistance Toilet Transfer Method: Stand pivot, Squat pivot Toilet Transfer Equipment: Bedside commode, Drop arm bedside commode Tub/Shower Transfer: Not assessed   Therapy/Group: Individual Therapy  Gypsy Decant 12/21/2018, 8:34 AM

## 2018-12-21 NOTE — Progress Notes (Signed)
Speech Language Pathology Daily Session Note  Patient Details  Name: Courtney Terry MRN: [id number] Date of Birth: 1961/09/27  Today's Date: 12/21/2018 SLP Individual Time: 1000-1015 SLP Individual Time Calculation (min): 15 min  Short Term Goals: Week 4: SLP Short Term Goal 1 (Week 4): Patient will consume dysphagia 3 with thin liquids with minimal overt s/s of aspiration and supervision cues for use of swallowing compensatory strategies.  SLP Short Term Goal 2 (Week 4): Pt will demonstrate selective attention to tasks for 30 minutes in mildly distracting environment with supervision cues.  SLP Short Term Goal 3 (Week 4): Given Min A cues, pt will increase vocal intensity to achieve ~ 75% intelligibility at the simple conversation level.  SLP Short Term Goal 4 (Week 4): Pt will complete basic problem solving tasks with Min A cues.  SLP Short Term Goal 5 (Week 4): Pt will demonstrate intellectual awareness by answering yes/no questions regarding currently abilities to increase safety awareness with ~ 75% accuracy and Mod A cues.   Skilled Therapeutic Interventions:Skilled ST services focused on speech skills. SLP facilitated speech intelligibility in simple conversation, while attempting to encourage pt to participate in skilled ST services, pt demonstrated 80% intelligibility while laying on her side. SLP educated pt in speech intelligibility strategies, slowing rate and over articulation, however pt stated "i'm not doing that." Pt continued to state " I am going home and I don't know why you people keep telling me I am going somewhere else." SLP facilitated intellectual awareness of cognitive and physical deficits impacting her ability to discharge safety to home, given yes/no questions required mod A verbal cues, however denied deficits' functional impact on safety upon returning home. Pt requested ST to leave and session was ended 30 minutes early due to refusal to partciate. Pt was left in room  with call bell within reach and bed alarm set. Recommend to continue skilled ST services.      Pain Pain Assessment Pain Score: 0-No pain  Therapy/Group: Individual Therapy  Lavonta Tillis  [facility] 12/21/2018, 10:28 AM

## 2018-12-22 ENCOUNTER — Inpatient Hospital Stay (HOSPITAL_COMMUNITY): Payer: Medicaid Other | Admitting: Speech Pathology

## 2018-12-22 ENCOUNTER — Inpatient Hospital Stay (HOSPITAL_COMMUNITY): Payer: Medicaid Other

## 2018-12-22 ENCOUNTER — Inpatient Hospital Stay (HOSPITAL_COMMUNITY): Payer: Medicaid Other | Admitting: Occupational Therapy

## 2018-12-22 ENCOUNTER — Encounter (HOSPITAL_COMMUNITY): Payer: Medicaid Other | Admitting: Psychology

## 2018-12-22 DIAGNOSIS — F1910 Other psychoactive substance abuse, uncomplicated: Secondary | ICD-10-CM

## 2018-12-22 NOTE — Progress Notes (Signed)
Patient refused AM medications. Agreeable to take Levothyroxine and Anastrozole.

## 2018-12-22 NOTE — Progress Notes (Signed)
Occupational Therapy Session Note  Patient Details  Name: Courtney Terry MRN: [id number] Date of Birth: 1961/05/28  Today's Date: 12/22/2018 OT Individual Time: 0300-9233 OT Individual Time Calculation (min): 60 min    Short Term Goals: Week 1:  OT Short Term Goal 1 (Week 1): Pt will thread 1 LE into pants with supervision  OT Short Term Goal 1 - Progress (Week 1): Not met OT Short Term Goal 2 (Week 1): Pt will complete sit<stand for bathing with Min A and LRAD  OT Short Term Goal 2 - Progress (Week 1): Not met OT Short Term Goal 3 (Week 1): Pt will complete toilet transfer with Min A and LRAD OT Short Term Goal 3 - Progress (Week 1): Not met Week 2:  OT Short Term Goal 1 (Week 2): STGs=LTGs due to ELOS OT Short Term Goal 1 - Progress (Week 2): Not met Week 3:  OT Short Term Goal 1 (Week 3): Pt will complete 1 grooming task while sitting or standing at sink to increase OOB tolerance  OT Short Term Goal 2 (Week 3): Pt will complete BSC or toilet transfer with supervision       Skilled Therapeutic Interventions/Progress Updates:    Pt received in bed and agreeable to a shower.  Although it took considerable amount of time to get her going.  As pt began to get up, she began to have a BM and refused to continue getting up to go to the toilet.  She insisted on having a nurse come in the room to clean her up. Pt got irritated when I suggested we just clean up in the bathroom.  RN did arrive and cleansed pt.   Pt placed on portable O2 tank and pt ambulated to bathroom and needed to toilet a 2nd time. Pt did have a BM and was able to self cleanse.   She transferred to shower and did bathe self with S using lateral leans to wash bottom. Pt then ambulated back to EOB and donned shirt.  She layed back down in bed to wait for RN to return to replace pad on her bottom.  Pt resting in bed covered by a blanket with alarm on and all needs met.  Therapy Documentation Precautions:   Precautions Precautions: Fall Precaution Comments: Trach collar, supplemental 02 Restrictions Weight Bearing Restrictions: No     Pain: Pain Assessment Pain Score: 0-No pain   Therapy/Group: Individual Therapy  [place] 12/22/2018, 11:28 AM

## 2018-12-22 NOTE — Progress Notes (Signed)
Physical Therapy Session Note  Patient Details  Name: Courtney Terry MRN: [id number] Date of Birth: 02/14/1961  Today's Date: 12/22/2018 PT Individual Time: 1130-1205 PT Individual Time Calculation (min): 35 min   Short Term Goals: Week 3:  PT Short Term Goal 1 (Week 4): =LTG due to ELOS  Skilled Therapeutic Interventions/Progress Updates:  Patient in supine agreeable to PT.  Supine to sit S and transfer to w/c S with cues.  Propelled w/c to dayroom with S and cues for avoiding running into wall in hallway.  Gait in dayroom with min A impulsive and unsteady with high fall risk about 50' x 3.  Seated on NuStep x 6:30 for UE/LE at level 3.  Requesting chips for snack.  Obtained from vending machine patient pushing w/c both ways.  Sat in dayroom to consume with S.  Patient maintained on 3L O2 via [place] throughout session with SpO2 spot check after w/c mobility with dyspnea 2/4 at 100%.  Patient propelled to room and transferred to bed with CGA.  Left in supine with call bell and bed alarm on.  Therapy Documentation Precautions:  Precautions Precautions: Fall Precaution Comments: Trach collar, supplemental 02 Restrictions Weight Bearing Restrictions: No Pain: Pain Assessment Pain Score: 0-No pain    Therapy/Group: Individual Therapy  Reginia Naas  [place], PT 12/22/2018, 12:06 PM
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
## 2018-12-22 NOTE — Progress Notes (Signed)
Nutrition Follow-up  DOCUMENTATION CODES:   Severe malnutrition in context of chronic illness, Underweight  INTERVENTION:   - Ensure Enlive po TID with meals, each supplement provides 350 kcal and 20 grams of protein (vanilla flavor)  - Double protein portions TID with meals  - Continue MVI with minerals daily  NUTRITION DIAGNOSIS:   Severe Malnutrition related to chronic illness (COPD, hx breast cancer) as evidenced by severe fat depletion, severe muscle depletion, percent weight loss (25% weight loss in 10 months).  Ongoing  GOAL:   Patient will meet greater than or equal to 90% of their needs  Progressing  MONITOR:   PO intake, Supplement acceptance, Diet advancement, Skin, Weight trends  REASON FOR ASSESSMENT:   Other (underweight BMI)    ASSESSMENT:   58 year old female with PMH significant for bipolar disorder, seizure disorder, breast cancer with lumpectomy, chronic hypotension, polysubstance abuse, COPD, tobacco abuse. Pt with recent hospitalizations for COPD exacerbation for acute on chronic hypoxemic and hypercarbic respiratory failure. Pt presented 11/08/18 for increasing SOB and decreased level of alertness requiring intubation. Urine drug screen positive for cocaine. Pt remained intubated through 11/19/18 with tracheostomy completed on 02/13. Pt required Cortrak tube for nutrition support but diet advanced to a dysphagia 2 with nectar-thick liquid and Cortrak removed prior to admission to CIR  Pt was decannulated on 3/13.  Attempted to speak with pt during pt's scheduled break from therapies. However, pt was not in her room at time of visit.  Plan is for pt to d/c to SNF. Per MD, pt is medically stable for d/c.  Weights somewhat confusing with fluctuations between 93-166 lbs since admission. Unsure what pt's actual weight is at this time.  Meal Completion: 100%  Medications reviewed and include: Pepcid, Ensure Enlive TID (pt refusing ~94%), folic acid,  Prednisone, MVI with minerals, thiamine  Labs reviewed.  Diet Order:   Diet Order            DIET DYS 3 Room service appropriate? Yes; Fluid consistency: Thin  Diet effective 1000              EDUCATION NEEDS:   Not appropriate for education at this time  Skin:  Skin Assessment: Skin Integrity Issues: Stage II: mid perineum Stage III: mid perineum  Last BM:  12/20/18 large type 6  Height:   Ht Readings from Last 1 Encounters:  12/04/18 5\' 10"  (1.778 m)    Weight:   Wt Readings from Last 1 Encounters:  12/16/18 64.2 kg    Ideal Body Weight:  68.2 kg  BMI:  Body mass index is 20.31 kg/m.  Estimated Nutritional Needs:   Kcal:  1650-1850  Protein:  85-100 grams  Fluid:  >/= 1.7 L    Gaynell Face, MS, RD, LDN Inpatient Clinical Dietitian Pager: [phone] Weekend/After Hours: [phone]

## 2018-12-22 NOTE — Progress Notes (Signed)
Patient refused Lovenox injection.

## 2018-12-22 NOTE — Progress Notes (Signed)
Physical Therapy Weekly Progress Note  Patient Details  Name: Courtney Terry MRN: [id number] Date of Birth: 12-Sep-1961  Beginning of progress report period: December 14, 2018 End of progress report period: December 22, 2018  Today's Date: 12/22/2018     Patient has met 0 of 1 short term goals.  Did not set any STG due to ELOS, STG = LTG. Patient is currently Supervision for bed mobility, CGA for transfers, and min A for gait up to 60 ft. Pt's progress has been limited by self-limiting behavior and inconsistent willingness to participate during therapy sessions. Pt shows poor insight into her deficits as well as poor safety awareness. Patient is anticipating d/c to SNF as her family is unable to provide the level of care she will need at discharge.  Patient continues to demonstrate the following deficits muscle weakness, decreased cardiorespiratoy endurance and decreased oxygen support and decreased standing balance, decreased postural control and decreased balance strategies and therefore will continue to benefit from skilled PT intervention to increase functional independence with mobility.  Patient progressing toward long term goals..  Continue plan of care.  PT Short Term Goals Week 3:  PT Short Term Goal 1 (Week 3): = LTG due to ELOS PT Short Term Goal 1 - Progress (Week 3): Progressing toward goal Week 4:  PT Short Term Goal 1 (Week 4): =LTG due to ELOS  Skilled Therapeutic Interventions/Progress Updates:  Training and development officer;Ambulation/gait training;Community reintegration;Discharge planning;Cognitive remediation/compensation;DME/adaptive equipment instruction;Disease management/prevention;Functional electrical stimulation;Neuromuscular re-education;Functional mobility training;Pain management;Patient/family education;Psychosocial support;Splinting/orthotics;Stair training;Skin care/wound management;Therapeutic Exercise;UE/LE Strength taining/ROM;Therapeutic Activities;UE/LE  Coordination activities;Wheelchair propulsion/positioning;Visual/perceptual remediation/compensation   Therapy Documentation Precautions:  Precautions Precautions: Fall Precaution Comments: Trach collar, supplemental 02 Restrictions Weight Bearing Restrictions: No   Therapy/Group: Individual Therapy   Excell Seltzer, PT, DPT 12/22/2018, 8:18 AM

## 2018-12-22 NOTE — Progress Notes (Signed)
[facility] PHYSICAL MEDICINE & REHABILITATION PROGRESS NOTE   Subjective/Complaints: No new complaints  ROS: limited by behavior Objective:   No results found. Recent Labs    12/21/18 0642  WBC 11.3*  HGB 11.0*  HCT 36.4  PLT 247   Recent Labs    12/21/18 0642  NA 139  K 4.2  CL 101  CO2 31  GLUCOSE 87  BUN 17  CREATININE 0.37*  CALCIUM 9.0    Intake/Output Summary (Last 24 hours) at 12/22/2018 0859 Last data filed at 12/21/2018 1700 Gross per 24 hour  Intake 720 ml  Output -  Net 720 ml     Physical Exam: Vital Signs Blood pressure 110/61, pulse 61, temperature 98.4 F (36.9 C), temperature source Oral, resp. rate 16, height 5\' 10"  (1.778 m), weight 64.2 kg, SpO2 100 %. Constitutional: No distress . Vital signs reviewed. HEENT: EOMI, oral membranes moist Neck: supple, trach stoma dressed Cardiovascular: RRR without murmur. No JVD    Respiratory: CTA Bilaterally without wheezes or rales. Normal effort    GI: BS +, non-tender, non-distended  Musc: No edema or tenderness in extremities. Skin: No evidence of breakdown, no evidence of rash Neurologic: Alert Motor: Grossly 4+/5 all 4's Psych: in good spirits   Assessment/Plan: 1. Functional deficits secondary to debilitiy from respiratory failure which require interdisciplinary therapy in a comprehensive inpatient rehab setting.  Physiatrist is providing close team supervision and 24 hour management of active medical problems listed below.  Physiatrist and rehab team continue to assess barriers to discharge/monitor patient progress toward functional and medical goals  Care Tool:  Bathing  Bathing activity did not occur: Refused Body parts bathed by patient: Right arm, Left arm, Chest, Abdomen, Right upper leg, Left upper leg, Face   Body parts bathed by helper: Front perineal area, Buttocks, Left lower leg, Right lower leg     Bathing assist Assist Level: Moderate Assistance - Patient 50 - 74%      Upper Body Dressing/Undressing Upper body dressing Upper body dressing/undressing activity did not occur (including orthotics): Refused What is the patient wearing?: Pull over shirt    Upper body assist Assist Level: Moderate Assistance - Patient 50 - 74%    Lower Body Dressing/Undressing Lower body dressing    Lower body dressing activity did not occur: Refused What is the patient wearing?: Pants     Lower body assist Assist for lower body dressing: Maximal Assistance - Patient 25 - 49%     Toileting Toileting Toileting Activity did not occur Landscape architect and hygiene only): Refused  Toileting assist Assist for toileting: Maximal Assistance - Patient 25 - 49%     Transfers Chair/bed transfer  Transfers assist     Chair/bed transfer assist level: Contact Guard/Touching assist     Locomotion Ambulation   Ambulation assist   Ambulation activity did not occur: Safety/medical concerns  Assist level: Minimal Assistance - Patient > 75% Assistive device: Hand held assist Max distance: 24ft   Walk 10 feet activity   Assist  Walk 10 feet activity did not occur: Refused  Assist level: Minimal Assistance - Patient > 75% Assistive device: Parallel bars   Walk 50 feet activity   Assist Walk 50 feet with 2 turns activity did not occur: Safety/medical concerns  Assist level: Minimal Assistance - Patient > 75%      Walk 150 feet activity   Assist Walk 150 feet activity did not occur: Safety/medical concerns  Walk 10 feet on uneven surface  activity   Assist Walk 10 feet on uneven surfaces activity did not occur: Safety/medical concerns         Wheelchair     Assist   Type of Wheelchair: Manual Wheelchair activity did not occur: Refused  Wheelchair assist level: Supervision/Verbal cueing Max wheelchair distance: 120    Wheelchair 50 feet with 2 turns activity    Assist    Wheelchair 50 feet with 2 turns activity did  not occur: Refused   Assist Level: Supervision/Verbal cueing   Wheelchair 150 feet activity     Assist Wheelchair 150 feet activity did not occur: Safety/medical concerns   Assist Level: Supervision/Verbal cueing    Medical Problem List and Plan: 1.Debility and metabolic encephalopathysecondary to acute on chronic respiratory failure status post tracheostomy 11/19/2018.Pulmonary services to follow-up on plan decannulation  Continue CIR 15/7 therapies--team conference today   -pending SNF vs home  -pt is medically stable for discharge  -although she is/has not making/made poor choices, I believe she is likely competent to make decisions re: her own well being. Will ask neuropsych to weigh in on this as well.  2.  DVT prophyllaxis Subcutaneous Lovenox 3. Pain Management:Tylenol as needed  4. Mood/bipolar disorder:Prozac 40 mg daily for mood stabilization, Klonopin 0.5 mg 3 times a day as needed for anxiety or agitation -fair sleep patterns at present 5. Neuropsych: This patientis potentiallycapable of making decisions on herown behalf. 6. Skin/Wound Care:Routine skin checks 7. Fluids/Electrolytes/Nutrition:encourage PO  - eating very well    8. Chronic hypotension. Patient had been on ProAmatine 10 mg 3 times a day as well as Florinef prior to admission. Resume as neededpendng tolerance of therapy Vitals:   12/22/18 0720 12/22/18 0722  BP:    Pulse:    Resp:    Temp:    SpO2: 100% 100%   Asymptomatic hypotension on 3/16 9.COPD with history of tobacco abuse. Patient on 2 L oxygen at home.Continue chronic prednisone. Follow-up pulmonary services Dr.Wert.Continue nebulizersas directed  -decannulated without issue  10. Seizure disorder. ContinueDepakoteas prior to admission--change to depakote ER 500mg  daily to improve compliance.. EEG negative No clinical seizure activity at present 11.Hypothyroidism. Synthroid 12. Dysphagia. Diet upgraded to D3  thins, tolerating well and eating more 13. Polysubstance abuse. UDS positive for cocaine. Provide counseling 14. History of breast cancer with lumpectomy. Patient on Arimidex 1 mg daily prior to admission.OK to restart per Dr Burr Medico Heme/Onc also Dr Burr Medico wants pt to f/u since she hasn't been seen since 2018 15.  Leukocytosis with chronic steroid use  Afebrile  WBCs 8.4 3/9---->11.3 3/16 16.  Medication compliance an ongoing issue. Daily ed by staff.  17. Sugars now normal---dc'ed SSI 18.  Acute blood loss anemia  Hemoglobin 11.0 3/16  Continue to monitor  ELOS: 25 days A FACE TO FACE EVALUATION WAS PERFORMED  Meredith Staggers 12/22/2018, 8:59 AM

## 2018-12-22 NOTE — Progress Notes (Signed)
Speech Language Pathology Daily Session Note  Patient Details  Name: Courtney Terry MRN: [id number] Date of Birth: 01-13-1961  Today's Date: 12/22/2018 SLP Individual Time: 1432-1500 SLP Individual Time Calculation (min): 28 min  Short Term Goals: Week 4: SLP Short Term Goal 1 (Week 4): Patient will consume dysphagia 3 with thin liquids with minimal overt s/s of aspiration and supervision cues for use of swallowing compensatory strategies.  SLP Short Term Goal 2 (Week 4): Pt will demonstrate selective attention to tasks for 30 minutes in mildly distracting environment with supervision cues.  SLP Short Term Goal 3 (Week 4): Given Min A cues, pt will increase vocal intensity to achieve ~ 75% intelligibility at the simple conversation level.  SLP Short Term Goal 4 (Week 4): Pt will complete basic problem solving tasks with Min A cues.  SLP Short Term Goal 5 (Week 4): Pt will demonstrate intellectual awareness by answering yes/no questions regarding currently abilities to increase safety awareness with ~ 75% accuracy and Mod A cues.   Skilled Therapeutic Interventions:  Pt was seen for skilled ST targeting dysphagia goals.  SLP facilitated the session with a snack of regular textures to continue working towards diet progression.  Pt consumed advanced consistencies with mod I use of swallowing precautions and no overt s/s of aspiration with solids or liquids.  Would recommend ongoing trials of advance textures with SLP.  Pt was left in bed with bed alarm set and call bell within reach.  Continue per current plan of care.    Pain Pain Assessment Pain Scale: 0-10 Pain Score: 0-No pain  Therapy/Group: Individual Therapy  Courtney Terry, Selinda Orion 12/22/2018, 3:12 PM

## 2018-12-23 ENCOUNTER — Inpatient Hospital Stay (HOSPITAL_COMMUNITY): Payer: Medicaid Other | Admitting: Speech Pathology

## 2018-12-23 ENCOUNTER — Inpatient Hospital Stay (HOSPITAL_COMMUNITY): Payer: Medicaid Other | Admitting: Occupational Therapy

## 2018-12-23 ENCOUNTER — Inpatient Hospital Stay (HOSPITAL_COMMUNITY): Payer: Medicaid Other | Admitting: Physical Therapy

## 2018-12-23 NOTE — Progress Notes (Signed)
Social Work Patient ID: Courtney Terry, female   DOB: 1961/05/22, 58 y.o.   MRN: [id number]  Have met with pt and [name] and spoken with pt's sister today to review team conference and d/c plans.  All pleased with progress and pt states very clearly that she fully intends to return to her home.  Per MD and neuropsychologist, pt has proven to be competent and able to make decisions on her own behalf.  Assisted pt this afternoon in speaking with [name] and making payments via phone to restart service.  [employer] rep reports they hope to have power on by tomorrow afternoon.  Have explained to pt and [name] that we must confirm power is on as she is on O2 in the home.  Still working on DME and [place] orders as well.  Continue to follow.  Porsha Skilton, LCSW

## 2018-12-23 NOTE — Patient Care Conference (Signed)
Inpatient RehabilitationTeam Conference and Plan of Care Update Date: 12/22/2018   Time: 2:00 PM    Patient Name: Courtney Terry      Medical Record Number: [id number]  Date of Birth: 02-02-1961 Sex: Female         Room/Bed: 4W09C/4W09C-01 Payor Info: Payor: MEDICAID [place] / Plan: MEDICAID [place] ACCESS / Product Type: *No Product type* /    Admitting Diagnosis: debility  resp failure  Admit Date/Time:  11/27/2018  3:56 PM Admission Comments: No comment available   Primary Diagnosis:  <principal problem not specified> Principal Problem: <principal problem not specified>  Patient Active Problem List   Diagnosis Date Noted  . Acute blood loss anemia   . Leucocytosis   . Labile blood glucose   . Idiopathic hypotension   . Bipolar affective disorder in remission ([place])   . Hypoxemia   . Acute respiratory failure with hypoxemia ([place])   . Tracheostomy care ([place])   . Metabolic encephalopathy 60/73/7106  . Tracheostomy status ([place])   . Acute respiratory failure with hypoxia and hypercapnia ([place]) 11/08/2018  . Severely underweight adult 09/13/2018  . COPD with acute exacerbation ([place]) 09/12/2018  . Syncope 09/12/2018  . Tobacco use disorder 05/01/2018  . Depression 01/26/2018  . CHF (congestive heart failure) ([place]) 11/11/2017  . PID (acute pelvic inflammatory disease) 11/11/2017  . Seizures ([place])   . Hypertension   . Palliative care encounter   . Goals of care, counseling/discussion   . Hypoxia   . Rhinovirus infection 09/30/2016  . Hypothyroidism 09/29/2016  . Bipolar I disorder ([place]) 09/29/2016  . Acute and chronic respiratory failure with hypercapnia ([place]) 09/28/2016  . Polysubstance abuse ([place]) 08/13/2016  . Cocaine abuse ([place]) 08/13/2016  . Schizophrenia ([place]) 04/23/2016  . COPD (chronic obstructive pulmonary disease) ([place]) 04/23/2016  . Chronic respiratory failure with hypoxia ([place]) 03/29/2016  . Acute on chronic respiratory failure ([place]) 02/28/2016  . Protein-calorie  malnutrition, severe 02/27/2016  . Elevated troponin   . History of breast cancer 10/20/2015  . Exposure of implanted prstht mtrl to surrnd org/tiss, init 06/19/2015  . Complication of internal breast prosthesis 06/19/2015  . Acquired absence of breast and nipple 06/06/2015  . H/O right mastectomy 06/06/2015  . COPD exacerbation ([place]) 06/29/2014  . COPD GOLD IV D 02/27/2013  . Malignant neoplasm of upper-outer quadrant of female breast ([place]) 01/31/2012  . Epilepsy ([place]) 12/02/2007    Expected Discharge Date: Expected Discharge Date: 12/24/18(VS SNF)  Team Members Present: Physician leading conference: Dr. Alger Simons Social Worker Present: Lennart Pall, LCSW Nurse Present: Rosita Fire, LPN PT Present: Other (comment)(Taylor Ervin Knack, PT) OT Present: Amy Rounds, OT SLP Present: Windell Moulding, SLP PPS Coordinator present : Gunnar Fusi     Current Status/Progress Goal Weekly Team Focus  Medical   Patient tolerated decannulation without any issues.  Still refusing therapy and treatments at times.  Still cognitively impaired  Prepare medically for discharge to next venue of care  See above   Bowel/Bladder   incontinent of B/B, able to verbalize when needing incontinent care, LBM-12/20/18  Regain continence of bowel and bladder regimen  Timed toileting while awake, assist with toileting needs   Swallow/Nutrition/ Hydration   dys 3, thin liquids   Sup A  continue working towards, regular trials   ADL's   min A overall . Greatest barrier is pt participation   Min-Mod A overall       Mobility   S bed mobility, CGA transfers, S w/c mobility, min A  gait up to 62' with no AD  min A overall  participation, standing balance, gait training, OOB tolerance   Communication   min assist-supervision   supervision   continue to address expressing wants and needs with increasing complexity   Safety/Cognition/ Behavioral Observations  min-mod assist  min assist   continue to address basic problem  solving, sustained attention, recall   Pain   Denies pain  Remain pain free  Assess pain every shift and prn, treat prn   Skin   Stage II to area between buttocks-epithialized Stage III to sacrum-pink with yellow slough-daily drsg with santyl  reposition frequently, keep wounds clean and free of infection  Assess skin every shift and prn      *See Care Plan and progress notes for long and short-term goals.     Barriers to Discharge  Current Status/Progress Possible Resolutions Date Resolved   Physician    Lack of/limited family support               Nursing                  PT  Inaccessible home environment;Decreased caregiver support;Medical stability;Home environment access/layout;Trach;Lack of/limited family support;Insurance for SNF coverage;Medication compliance;Behavior;Other (comments)  inconsistent willingness to participate in therapy              OT                  SLP                SW   Anticipate plan now to change to pt returning home with [name] as primary caregiver.  Awaiting neuropsych and MD to make decision on pt's competancy.  She insists on d/c home.  Lurline Idol out.           Discharge Planning/Teaching Needs:  Anticipate plan now to change to pt returning home with [name] as primary caregiver.  Awaiting neuropsych and MD to make decision on pt's competancy.  She insists on d/c home.  Lurline Idol out.  TBD   Team Discussion:  Medically stable and trach out without issue.  Still with santyl to wound on bottom.  Min assist overall with PT/OT/ ST when she agrees to participate.  SW reports anticipating d/c change to returning home.  SW confirmed with family that pt's apartment is still available to pt, however, power was cut off - SW to follow up and keep team posted.  Revisions to Treatment Plan:  Change in d/c plan    Continued Need for Acute Rehabilitation Level of Care: The patient requires daily medical management by a physician with specialized training in physical  medicine and rehabilitation for the following conditions: Daily direction of a multidisciplinary physical rehabilitation program to ensure safe treatment while eliciting the highest outcome that is of practical value to the patient.: Yes Daily medical management of patient stability for increased activity during participation in an intensive rehabilitation regime.: Yes Daily analysis of laboratory values and/or radiology reports with any subsequent need for medication adjustment of medical intervention for : Neurological problems;Pulmonary problems   I attest that I was present, lead the team conference, and concur with the assessment and plan of the team.   Rozetta Stumpp 12/23/2018, 11:38 AM

## 2018-12-23 NOTE — Progress Notes (Signed)
Physical Therapy Discharge Summary  Patient Details  Name: Courtney Terry MRN: [id number] Date of Birth: 09-25-1961  Today's Date: 12/23/2018  Patient has met 7 of 7 long term goals due to improved activity tolerance, improved balance, improved postural control, ability to compensate for deficits, improved attention, improved awareness and improved coordination.  Patient to discharge at a wheelchair level Supervision to min A overall. Patient's care partner is unavailable for training during therapy sessions but patient is unconcerned about her family's ability to provide the necessary physical and cognitive assistance at discharge. Patient is aware of team recommendations for assist at home as well as her family's wishes and chooses to d/c home at this time. Pt has been resistant to education throughout her therapy stay and has exhibited self-limiting behaviors including not participating in therapy sessions and being resistant to any assist from therapists during her sessions.  Reasons goals not met: Pt has met all rehab goals. Stair goal was d/c several weeks ago due to patient refusal to complete stairs during her sessions.  Recommendation:  Patient will benefit from ongoing skilled PT services in home health setting to continue to advance safe functional mobility, address ongoing impairments in balance, coordination, endurance, strength, safety, and minimize fall risk.  Equipment: Manual 16x16 wheelchair  Reasons for discharge: treatment goals met and discharge from hospital  Patient/family agrees with progress made and goals achieved: Yes  PT Discharge Precautions/Restrictions Precautions Precautions: Fall Precaution Comments: supplemental O2 Restrictions Weight Bearing Restrictions: No Vision/Perception  Perception Perception: Within Functional Limits Praxis Praxis: Impaired Praxis Impairment Details: Motor planning;Initiation  Cognition Overall Cognitive Status:  Impaired/Different from baseline Arousal/Alertness: Awake/alert Orientation Level: Oriented X4 Attention: Sustained Sustained Attention: Impaired Memory: Impaired Memory Impairment: Decreased recall of new information;Decreased short term memory Awareness: Impaired Awareness Impairment: Intellectual impairment Problem Solving: Impaired Behaviors: Restless;Impulsive;Verbal agitation;Physical agitation;Poor frustration tolerance Safety/Judgment: Impaired Sensation Sensation Light Touch: Appears Intact Proprioception: Appears Intact Coordination Gross Motor Movements are Fluid and Coordinated: No Fine Motor Movements are Fluid and Coordinated: No Coordination and Movement Description: impaired by generalized weakness Motor  Motor Motor: Other (comment);Abnormal postural alignment and control;Motor apraxia Motor - Skilled Clinical Observations: Generalized weakness due to muscle atrophy  Motor - Discharge Observations: ongoing generalized weakness 2/2 muscle atrophy  Mobility Bed Mobility Bed Mobility: Rolling Right;Rolling Left;Supine to Sit;Sit to Supine Rolling Right: Supervision/verbal cueing Rolling Left: Supervision/Verbal cueing Supine to Sit: Supervision/Verbal cueing Sit to Supine: Supervision/Verbal cueing Transfers Transfers: Sit to Stand;Stand Pivot Transfers Sit to Stand: Supervision/Verbal cueing Stand Pivot Transfers: Supervision/Verbal cueing Stand Pivot Transfer Details: Verbal cues for precautions/safety Transfer (Assistive device): None Locomotion  Gait Ambulation: Yes Gait Assistance: Supervision/Verbal cueing Assistive device: None Gait Assistance Details: Verbal cues for precautions/safety Gait Gait: Yes Gait Pattern: Impaired Gait Pattern: Ataxic Gait velocity: decreased Stairs / Additional Locomotion Stairs: No Wheelchair Mobility Wheelchair Mobility: Yes Wheelchair Assistance: Chartered loss adjuster: Both upper  extremities Wheelchair Parts Management: Supervision/cueing  Trunk/Postural Assessment  Cervical Assessment Cervical Assessment: Exceptions to WFL(forward head) Thoracic Assessment Thoracic Assessment: Exceptions to WFL(rounded shoulders, kyphotic) Lumbar Assessment Lumbar Assessment: Exceptions to WFL(posterior pelvic tilt) Postural Control Postural Control: Deficits on evaluation Righting Reactions: delayed  Balance Balance Balance Assessed: Yes Dynamic Sitting Balance Sitting balance - Comments: Supervision Extremity Assessment   RLE Assessment RLE Assessment: Exceptions to [facility] General Strength Comments: grossly 4/5  LLE Assessment LLE Assessment: Exceptions to [facility] General Strength Comments: grossly 4/5     Excell Seltzer, PT, DPT 12/23/2018, 7:16 PM

## 2018-12-23 NOTE — Discharge Summary (Signed)
Physician Discharge Summary  Patient ID: Courtney Terry MRN: [id number] DOB/AGE: March 27, 1961 58 y.o.  Admit date: 11/27/2018 Discharge date: 12/24/2018  Discharge Diagnoses:  Active Problems:   Metabolic encephalopathy   Tracheostomy care ([place])   Acute respiratory failure with hypoxemia ([facility])   Bipolar affective disorder in remission ([place])   Hypoxemia   Acute blood loss anemia   Leucocytosis   Labile blood glucose   Idiopathic hypotension   Discharged Condition: stable  Significant Diagnostic Studies: Dg Chest 2 View  Result Date: 12/03/2018 CLINICAL DATA:  Pt c/o fever x 1 day. Hx of HTN, COPD, AND asthma. Pt is a current smoker. EXAM: CHEST - 2 VIEW COMPARISON:  11/23/2018 FINDINGS: Cardiac silhouette is normal in size. No mediastinal or hilar masses. No convincing adenopathy. Lungs are hyperexpanded. There is streaky opacity at the lung bases most evident on the right. This has developed since chest radiographs from earlier this month. Opacities may be due to atelectasis, infection or a combination. Remainder of the lungs is clear. No pleural effusion or pneumothorax. Tracheostomy tube is stable from the most recent prior exam, well positioned. Surgical vascular clips on the right are consistent with prior right breast surgery. Skeletal structures are demineralized but intact. IMPRESSION: 1. Streaky lung base opacities, most evident in the right lower lobe, similar to the most recent prior exam. These opacities may be due to atelectasis or infection. Bronchopneumonia suspected in the right lower lobe. 2. Underlying COPD.  No evidence of pulmonary edema. Electronically Signed   By: Lajean Manes M.D.   On: 12/03/2018 21:04   Dg Abd Portable 1v  Result Date: 11/25/2018 CLINICAL DATA:  Ileus. EXAM: PORTABLE ABDOMEN - 1 VIEW COMPARISON:  Radiograph of November 23, 2018. FINDINGS: Distal tip of nasogastric tube is seen in expected position of distal stomach or proximal duodenum. Distal tip  of feeding tube is seen in distal stomach. No abnormal bowel dilatation is noted. This is significantly improved compared to prior exam. No abnormal calcifications noted IMPRESSION: No definite evidence of bowel obstruction or ileus. Distal tip of nasogastric tube seen in either distal stomach or proximal duodenum. Distal tip of feeding tube seen in distal stomach. Electronically Signed   By: Marijo Conception, M.D.   On: 11/25/2018 11:28    Labs:  Basic Metabolic Panel: Recent Labs  Lab 12/21/18 0642  NA 139  K 4.2  CL 101  CO2 31  GLUCOSE 87  BUN 17  CREATININE 0.37*  CALCIUM 9.0    CBC: Recent Labs  Lab 12/21/18 0642  WBC 11.3*  HGB 11.0*  HCT 36.4  MCV 92.4  PLT 247    CBG: Recent Labs  Lab 12/18/18 2110 12/19/18 0618 12/19/18 2110 12/20/18 1127 12/20/18 1646  GLUCAP 107* 81 102* 77 90   Family history. Mother with coronary artery disease Sr. With cervical cancer. Brother with colon cancer 2 sisters with breast cancer  Brief HPI:    Courtney Terry is a 58 year old right-handed female history of bipolar disorder, seizure disorder maintained on valproate, breast cancer with lumpectomy, chronic hypotension maintained on ProAmatine and Florinef in the past as well as polysubstance abuse, COPD with 2 L of oxygen at home with chronic prednisone followed by Dr.Wert. patient recent hospitalization for COPD exacerbation for acute on chronic hypoxic and hypercarbic respiratory failure. She lives with a roommate independent prior to admission. Presented to 11/26/2018 for increasing shortness of breath decreased level of alertness requiring intubation. Urine drug screen positive cocaine. CTA completed  the emergency room negative for pulmonary emboli. Placed on intravenous Solu-Medrol. EEG moderately global encephalopathy and negative for seizure. She did remain on valproate for seizure prophylaxis. Cranial CT scan negative. Patient remained intubated through 11/19/2018 with  tracheostomy completed 11/19/2018 per Dr. Nelda Marseille. Maintained on subcutaneous heparin for DVT prophylaxis. Dysphagia #2 nectar thick liquid diet. Bouts of restlessness and agitation heart rate in the 150s and monitored. Therapy evaluations completed and patient was admitted for operative rehabilitation program.  Hospital Course: Courtney Terry was admitted to rehab 11/27/2018 for inpatient therapies to consist of PT, ST and OT at least three hours five days a week. Past admission physiatrist, therapy team and rehab RN have worked together to provide customized collaborative inpatient rehab. Pertaining to patient acute on chronic respiratory failure tracheostomy completed 11/19/2018 followed by pulmonary services she was decannulated without issue. Still in need of oxygen therapy as prior to admission and chronic prednisone. Bipolar disorder she continued on Klonopin as needed as well as scheduled Prozac patient with documented history of chronic hypotension and her ProAmatine remained on hold. Subcutaneous Lovenox for DVT prophylaxis. She continued on Depakote for history of seizure disorder. Hypothyroidism with Synthroid. She is tolerating a mechanical soft diet with thin liquids. Noted history of breast cancer she remained on armidex. Wound care with noted santyl applied to pressure wound on sacrum and cover with wet to dry dressing. Patient with long history of polysubstance abuse urine drug screen positive for cocaine at time of admission. Patient did receive full counsel regard to cessation of illicit drug products including alcohol and tobacco and all risk factors associated.  Physical exam. Blood pressure 136/90 pulse 85 temperature 90.8. Constitutional. Patient was frail appearing looking older than stated age. HEENT head normocephalic/atraumatic. Eyes round and reactive to light no nystagmus Neck.Supple nontender no JVD without bruit Cardiac regular rate and rhythm without murmur or  rub Respiratory. Effort normal no respiratory distress she had no wheezes or stridor GI soft no distention nontender without rebound Neurological. She was alert sitting up in bed made good eye contact with examiner answers basic questions follow simple commands Motor muscle strength 3-4 out of 5 proximal to distal upper extremities lower extremities 3 minus hip flexors, knee extension and 4 out of 5 ankle dorsi plantar flexion. Withdraws to deep stimuli  Rehab course: During patient's stay in rehab weekly team conferences were held to monitor patient's progress, set goals and discuss barriers to discharge. At admission, patient required  Max assist for stand pivot transfers squat pivot transfers minimal guard supine to sit. Max assist for ADLs.  She  has had improvement in activity tolerance, balance, postural control as well as ability to compensate for deficits. He/She has had improvement in functional use RUE/LUE  and RLE/LLE as well as improvement in awareness.supine to sit supervision in transfers to wheelchair supervision. Ambulate 50 feet 3 contact guard working with energy conservation techniques. She continue with oxygen therapy as needed. She was a bit impulsive and discussed all issues of safety. She did need some encouragement at times to participate with therapies especially with occupational therapy.she could transfer to shower and did bathing supervision. Ambulates back to edge of bed. She did receive follow-up through her rehabilitation stay by both nursing and neuropsychology. Again all issues discussed in regards to her safety.Full teaching completed the plan was discharge to home       Disposition: Discharge disposition: 01-Home or Self Care       Discharged to home  Diet: soft  Special Instructions:  No driving, smoking, tobacco use alcohol use or illicit drug use  Continue oxygen therapy nasal cannula  Santyl applied to pressure wound on sacrum and cover with wet  to dry dressing daily as needed   Medications at time of discharge. 1. Tylenol as needed 2.Armidex 1 mg daily 3. Depakote 500 mg daily 4. Klonopin 0.5 mg 3 times a day as needed anxiety 5. Pepcid 20 mg twice a day 6. Prozac 40 mg daily 7. Folic acid 1 mg daily 8. Synthroid 75 g daily 9. Multivitamin daily 10. Prednisone 10 mg by mouth daily 11. Florinef 0.1 mg daily 12. Resume nebulizer treatments as prior to admission  Follow-up Information    Meredith Staggers, MD Follow up.   Specialty:  Physical Medicine and Rehabilitation Why:  as directed Contact information: [address] [place] [address] [phone]        Tanda Rockers, MD Follow up.   Specialty:  Pulmonary Disease Why:  call for appointment Contact information: [facility] [place] [address] [phone]           Signed: Lavon Paganini Cottle 12/24/2018, 6:15 AM

## 2018-12-23 NOTE — Progress Notes (Signed)
SLP Cancellation Note  Patient Details Name: Courtney Terry MRN: [id number] DOB: 02/17/1961   Cancelled treatment:        Attempted to see pt for therapy this morning.  Upon arrival, pt had complaints of sacral pain and declined to participate in therapies.  RN already aware of need for pain meds.  Will continue to follow up as pt is available.                                                                                                  Carmichael Burdette, Selinda Orion 12/23/2018, 11:02 AM

## 2018-12-23 NOTE — Progress Notes (Signed)
Physical Therapy Session Note  Patient Details  Name: Courtney Terry MRN: [id number] Date of Birth: 05/29/1961  Today's Date: 12/23/2018 PT Individual Time: 1030-1100 PT Individual Time Calculation (min): 30 min   Short Term Goals: Week 4:  PT Short Term Goal 1 (Week 4): =LTG due to ELOS  Skilled Therapeutic Interventions/Progress Updates:    Pt received supine in bed, agreeable to participate in therapy session. No complaints of pain. Bed mobility Supervision. Stand pivot transfer bed to w/c with Supervision. Once seated in w/c pt reports incontinence and that she needs to be changed. Encouraged pt to perform pericare in standing as she is physically able to do this, pt refuses and requests to return to bed. Stand pivot transfer back to bed with Supervision. Pt is setup A for brief change and pericare for urinary incontinence. Stand pivot back to w/c with Supervision. Manual w/c propulsion x 150 ft with Supervision. Sit to stand with Supervision. Ambulation 2 x 50 ft with close Supervision. Pt is ataxic with gait with path deviation and unsteadiness on her feet, refuses any assist from therapist and becomes verbally agitated with therapist. Pt on 2L O2 throughout session via nasal cannula, SpO2 remains at 93% and above with activity. Pt requests to return to bed at end of session, Supervision for stand pivot transfer. Pt left seated in bed with needs in reach, bed alarm in place.  Therapy Documentation Precautions:  Precautions Precautions: Fall Precaution Comments: supplemental O2 Restrictions Weight Bearing Restrictions: No    Therapy/Group: Individual Therapy   Excell Seltzer, PT, DPT  12/23/2018, 7:18 PM

## 2018-12-23 NOTE — Progress Notes (Signed)
Patient refused Lovenox injection.

## 2018-12-23 NOTE — Progress Notes (Signed)
Occupational Therapy Note  Patient Details  Name: Courtney Terry MRN: [id number] Date of Birth: December 27, 1960  Today's Date: 12/23/2018 OT Missed Time: 15 Minutes Missed Time Reason: Patient unwilling/refused to participate without medical reason  Pt in bed and sleeping soundly on L side with caregiver present in room. Pt opening eyes and refusing to speak/acknowledge therapist. Pt closing eyes and refusing OT intervention this morning. Pt remained in bed with all needs within reach.   Darleen Crocker P 12/23/2018, 7:15 AM

## 2018-12-23 NOTE — Progress Notes (Signed)
Speech Language Pathology Daily Session Note  Patient Details  Name: Courtney Terry MRN: [id number] Date of Birth: 1961-09-15  Today's Date: 12/23/2018 SLP Individual Time: 1440-1502 SLP Individual Time Calculation (min): 22 min  Short Term Goals: Week 4: SLP Short Term Goal 1 (Week 4): Patient will consume dysphagia 3 with thin liquids with minimal overt s/s of aspiration and supervision cues for use of swallowing compensatory strategies.  SLP Short Term Goal 2 (Week 4): Pt will demonstrate selective attention to tasks for 30 minutes in mildly distracting environment with supervision cues.  SLP Short Term Goal 3 (Week 4): Given Min A cues, pt will increase vocal intensity to achieve ~ 75% intelligibility at the simple conversation level.  SLP Short Term Goal 4 (Week 4): Pt will complete basic problem solving tasks with Min A cues.  SLP Short Term Goal 5 (Week 4): Pt will demonstrate intellectual awareness by answering yes/no questions regarding currently abilities to increase safety awareness with ~ 75% accuracy and Mod A cues.   Skilled Therapeutic Interventions:  Pt was seen for skilled ST targeting cognitive goals.  Pt was resting in bed upon therapist's arrival but was agreeable to participating in therapy with minimal encouragement.  SLP facilitated the session with a novel card game targeting goals for problem solving.  Pt was able to plan and execute a problem solving strategy with min assist faded to supervision cues.  Pt slightly impulsive during transfers in and out of bed and needed verbal cues to apply brakes to wheelchair prior to transfer.  Pt was returned to room and left in bed with all needs within reach.  Continue per current plan of care .   Pain Pain Assessment Pain Scale: 0-10 Pain Score: 0-No pain  Therapy/Group: Individual Therapy  Alynn Ellithorpe, Selinda Orion 12/23/2018, 3:13 PM

## 2018-12-23 NOTE — Progress Notes (Signed)
[facility] PHYSICAL MEDICINE & REHABILITATION PROGRESS NOTE   Subjective/Complaints: Pt eating breakfast. Anxious to get home. No new complaints  ROS: Patient denies fever, rash, sore throat, blurred vision, nausea, vomiting, diarrhea, cough, shortness of breath or chest pain, joint or back pain, headache, or mood change.   Objective:   No results found. Recent Labs    12/21/18 0642  WBC 11.3*  HGB 11.0*  HCT 36.4  PLT 247   Recent Labs    12/21/18 0642  NA 139  K 4.2  CL 101  CO2 31  GLUCOSE 87  BUN 17  CREATININE 0.37*  CALCIUM 9.0    Intake/Output Summary (Last 24 hours) at 12/23/2018 0846 Last data filed at 12/22/2018 1805 Gross per 24 hour  Intake 460 ml  Output -  Net 460 ml     Physical Exam: Vital Signs Blood pressure 113/73, pulse 69, temperature 98.2 F (36.8 C), temperature source Oral, resp. rate 18, height 5\' 10"  (1.778 m), weight 64.2 kg, SpO2 95 %. Constitutional: No distress . Vital signs reviewed. HEENT: EOMI, oral membranes moist Neck: supple, trach site closed Cardiovascular: RRR without murmur. No JVD    Respiratory: CTA Bilaterally without wheezes or rales. Normal effort    GI: BS +, non-tender, non-distended  Musc: No edema or tenderness in extremities. Skin: No evidence of breakdown, no evidence of rash Neurologic: Alert Motor: Grossly 4+/5 all 4's Psych: in good spirits   Assessment/Plan: 1. Functional deficits secondary to debilitiy from respiratory failure which require interdisciplinary therapy in a comprehensive inpatient rehab setting.  Physiatrist is providing close team supervision and 24 hour management of active medical problems listed below.  Physiatrist and rehab team continue to assess barriers to discharge/monitor patient progress toward functional and medical goals  Care Tool:  Bathing  Bathing activity did not occur: Refused Body parts bathed by patient: Right arm, Left arm, Chest, Abdomen, Right upper leg,  Left upper leg, Face, Front perineal area, Buttocks, Right lower leg, Left lower leg   Body parts bathed by helper: Front perineal area, Buttocks, Left lower leg, Right lower leg     Bathing assist Assist Level: Supervision/Verbal cueing(sitting on tub bench)     Upper Body Dressing/Undressing Upper body dressing Upper body dressing/undressing activity did not occur (including orthotics): Refused What is the patient wearing?: Pull over shirt    Upper body assist Assist Level: Set up assist    Lower Body Dressing/Undressing Lower body dressing    Lower body dressing activity did not occur: Refused What is the patient wearing?: Pants     Lower body assist Assist for lower body dressing: Set up assist(from bed level)     Toileting Toileting Toileting Activity did not occur Landscape architect and hygiene only): Refused  Toileting assist Assist for toileting: Contact Guard/Touching assist     Transfers Chair/bed transfer  Transfers assist     Chair/bed transfer assist level: Contact Guard/Touching assist     Locomotion Ambulation   Ambulation assist   Ambulation activity did not occur: Safety/medical concerns  Assist level: Minimal Assistance - Patient > 75% Assistive device: (none) Max distance: 50'   Walk 10 feet activity   Assist  Walk 10 feet activity did not occur: Refused  Assist level: Minimal Assistance - Patient > 75% Assistive device: (none)   Walk 50 feet activity   Assist Walk 50 feet with 2 turns activity did not occur: Safety/medical concerns  Assist level: Minimal Assistance - Patient > 75% Assistive device: (  none)    Walk 150 feet activity   Assist Walk 150 feet activity did not occur: Safety/medical concerns         Walk 10 feet on uneven surface  activity   Assist Walk 10 feet on uneven surfaces activity did not occur: Safety/medical concerns         Wheelchair     Assist   Type of Wheelchair:  Manual Wheelchair activity did not occur: Refused  Wheelchair assist level: Supervision/Verbal cueing Max wheelchair distance: 120'    Wheelchair 50 feet with 2 turns activity    Assist    Wheelchair 50 feet with 2 turns activity did not occur: Refused   Assist Level: Supervision/Verbal cueing   Wheelchair 150 feet activity     Assist Wheelchair 150 feet activity did not occur: Safety/medical concerns   Assist Level: Supervision/Verbal cueing    Medical Problem List and Plan: 1.Debility and metabolic encephalopathysecondary to acute on chronic respiratory failure status post tracheostomy 11/19/2018.Pulmonary services to follow-up on plan decannulation     -pt is medically stable for discharge  -although she is/has not making/made poor choices, I believe she is likely competent to make decisions re: her own well being. Neuropsych agrees  -had a serious talk with pt again re: changes she needs to make from a health and psychosocial standpoint.  2.  DVT prophyllaxis Subcutaneous Lovenox 3. Pain Management:Tylenol as needed  4. Mood/bipolar disorder:Prozac 40 mg daily for mood stabilization, Klonopin 0.5 mg 3 times a day as needed for anxiety or agitation -fair sleep patterns at present 5. Neuropsych: This patientis potentiallycapable of making decisions on herown behalf. 6. Skin/Wound Care:Routine skin checks 7. Fluids/Electrolytes/Nutrition:   - eating very well    8. Chronic hypotension. Patient had been on ProAmatine 10 mg 3 times a day as well as Florinef prior to admission. Resume as neededpendng tolerance of therapy Vitals:   12/23/18 0814 12/23/18 0816  BP:    Pulse:    Resp:    Temp:    SpO2: 95% 95%   Asymptomatic hypotension on 3/18 9.COPD with history of tobacco abuse. Patient on 2 L oxygen at home.Continue chronic prednisone. Follow-up pulmonary services Dr.Wert.Continue nebulizersas directed  -decannulated without issue   10. Seizure disorder. ContinueDepakoteas prior to admission--change to depakote ER 500mg  daily to improve compliance.. EEG negative No clinical seizure activity at present 11.Hypothyroidism. Synthroid 12. Dysphagia. Diet upgraded to D3 thins, tolerating well and eating more 13. Polysubstance abuse. UDS positive for cocaine. Provide counseling 14. History of breast cancer with lumpectomy. Patient on Arimidex 1 mg daily prior to admission.OK to restart per Dr Burr Medico Heme/Onc also Dr Burr Medico wants pt to f/u since she hasn't been seen since 2018 15.  Leukocytosis with chronic steroid use  Afebrile  WBCs 8.4 3/9---->11.3 3/16 16.  Medication compliance an ongoing issue. Daily ed by staff.  17. Sugars now normal---dc'ed SSI 18.  Acute blood loss anemia  Hemoglobin 11.0 3/16  Continue to monitor  ELOS: 26 days A FACE TO FACE EVALUATION WAS PERFORMED  Meredith Staggers 12/23/2018, 8:46 AM

## 2018-12-24 ENCOUNTER — Inpatient Hospital Stay (HOSPITAL_COMMUNITY): Payer: Medicaid Other | Admitting: Occupational Therapy

## 2018-12-24 ENCOUNTER — Inpatient Hospital Stay (HOSPITAL_COMMUNITY): Payer: Medicaid Other | Admitting: Physical Therapy

## 2018-12-24 ENCOUNTER — Inpatient Hospital Stay (HOSPITAL_COMMUNITY): Payer: Medicaid Other | Admitting: Speech Pathology

## 2018-12-24 MED ORDER — OMEPRAZOLE 40 MG PO CPDR: DELAYED_RELEASE_CAPSULE | ORAL | 11 refills | Status: DC

## 2018-12-24 MED ORDER — ALBUTEROL SULFATE HFA 108 (90 BASE) MCG/ACT IN AERS: INHALATION_SPRAY | RESPIRATORY_TRACT | 3 refills | Status: DC

## 2018-12-24 MED ORDER — DIVALPROEX SODIUM ER 500 MG PO TB24: 500.0000 mg | ORAL_TABLET | Freq: Every day | ORAL | 1 refills | Status: DC

## 2018-12-24 MED ORDER — ACETAMINOPHEN 325 MG PO TABS: 650.0000 mg | ORAL_TABLET | Freq: Four times a day (QID) | ORAL | Status: DC | PRN

## 2018-12-24 MED ORDER — GLYCOPYRROLATE-FORMOTEROL 9-4.8 MCG/ACT IN AERO: 2.0000 | INHALATION_SPRAY | Freq: Two times a day (BID) | RESPIRATORY_TRACT | 0 refills | Status: DC

## 2018-12-24 MED ORDER — PREDNISONE 10 MG PO TABS: 10.0000 mg | ORAL_TABLET | Freq: Every day | ORAL | 0 refills | Status: AC

## 2018-12-24 MED ORDER — CLONAZEPAM 0.5 MG PO TBDP: 0.5000 mg | ORAL_TABLET | Freq: Three times a day (TID) | ORAL | 0 refills | Status: DC | PRN

## 2018-12-24 MED ORDER — LEVOTHYROXINE SODIUM 75 MCG PO TABS: 75.0000 ug | ORAL_TABLET | Freq: Every day | ORAL | 1 refills | Status: AC

## 2018-12-24 MED ORDER — FOLIC ACID 1 MG PO TABS: 1.0000 mg | ORAL_TABLET | Freq: Every day | ORAL | 0 refills | Status: DC

## 2018-12-24 MED ORDER — COLLAGENASE 250 UNIT/GM EX OINT: TOPICAL_OINTMENT | Freq: Every day | CUTANEOUS | 0 refills | Status: DC

## 2018-12-24 MED ORDER — FLUOXETINE HCL 40 MG PO CAPS: 40.0000 mg | ORAL_CAPSULE | Freq: Every day | ORAL | 3 refills | Status: DC

## 2018-12-24 MED ORDER — ANASTROZOLE 1 MG PO TABS: 1.0000 mg | ORAL_TABLET | Freq: Every day | ORAL | 0 refills | Status: DC

## 2018-12-24 MED ORDER — FAMOTIDINE 20 MG PO TABS: 20.0000 mg | ORAL_TABLET | Freq: Two times a day (BID) | ORAL | 0 refills | Status: DC

## 2018-12-24 NOTE — Progress Notes (Signed)
Occupational Therapy Session Note  Patient Details  Name: Courtney Terry MRN: [id number] Date of Birth: [birth year]  Today's Date: 12/24/2018 OT Individual Time: 0930-1000 OT Individual Time Calculation (min): 30 min    Short Term Goals: Week 3:  OT Short Term Goal 1 (Week 3): Pt will complete 1 grooming task while sitting or standing at sink to increase OOB tolerance  OT Short Term Goal 2 (Week 3): Pt will complete BSC or toilet transfer with supervision   Skilled Therapeutic Interventions/Progress Updates:    Upon entering the room, pt sleeping soundly on her side with caregiver present and sleeping as well. Pt was agreeable to Occupational therapy with maximal encouragement. Pt transferred into wheelchair with supervision stand pivot transfer. Pt refused ambulation this session. Pt propelled wheelchair through obstacle course with supervision and min cuing for safety. Pt requesting to have snack and ate with supervision for safety while seated in wheelchair. Pt remained on 2 L O2 via Walker with O2 saturation remaining above 95%. Pt returning to bed in same manner as above. Bed alarm activated and call bell within reach.   Therapy Documentation Precautions:  Precautions Precautions: Fall Precaution Comments: supplemental O2 Restrictions Weight Bearing Restrictions: No General:   Vital Signs:   Pain: Pain Assessment Pain Scale: 0-10 Pain Score: 0-No pain ADL: ADL Eating: Not assessed Grooming: Supervision/safety Where Assessed-Grooming: Edge of bed Upper Body Bathing: Minimal assistance(for thoroughness) Where Assessed-Upper Body Bathing: Edge of bed Lower Body Bathing: Maximal assistance Where Assessed-Lower Body Bathing: Bed level, Edge of bed Upper Body Dressing: Maximal assistance Where Assessed-Upper Body Dressing: Edge of bed Lower Body Dressing: Maximal assistance Where Assessed-Lower Body Dressing: Edge of bed Toileting: Not assessed Toilet Transfer: Moderate  assistance Toilet Transfer Method: Stand pivot, Squat pivot Toilet Transfer Equipment: Bedside commode, Drop arm bedside commode Tub/Shower Transfer: Not assessed   Therapy/Group: Individual Therapy  Gypsy Decant 12/24/2018, 12:42 PM

## 2018-12-24 NOTE — Progress Notes (Addendum)
Social Work  Discharge Note  The overall goal for the admission was met for:   Discharge location: Yes - initially it was felt that pt would need to d/c to SNF, however, given progress and having trach removed, plan changed to d/c home.  MD has deemed pt competent to make her own decisions.  Length of Stay: Yes - 27 days  Discharge activity level: Yes - met upgraded goals of supervision  Home/community participation: Yes  Services provided included: MD, RD, PT, OT, SLP, RN, TR, Pharmacy, Neuropsych and SW  Financial Services: Medicaid  Follow-up services arranged: Home Health: RN, PT, OT, ST via [facility], DME: 16x16 lightweight w/c with Ulice Dash 2 cushion via [employer] and Patient/Family has no preference for HH/DME agencies  Comments (or additional information):  [employer] confirming that pt's power is back on at residence.  Contact Info:  Pt @ [phone]                Aide/ friend, Conley Simmonds @ [phone]               Sister, Leonia Reader @ [phone]  Patient/Family verbalized understanding of follow-up arrangements: Yes  Individual responsible for coordination of the follow-up plan: pt  Confirmed correct DME delivered: Lennart Pall 12/24/2018    Jahmad Petrich, Lorre Nick

## 2018-12-24 NOTE — Progress Notes (Signed)
Speech Language Pathology Discharge Summary  Patient Details  Name: Courtney Terry MRN: [id number] Date of Birth: [birth year]   Patient has met 6 of 6 long term goals.  Patient to discharge at overall Supervision level.  Reasons goals not met:     Clinical Impression/Discharge Summary:  Pt has made functional gains while inpatient and is discharging having met 6 out of 6 long term goals.  Pt's diet has been advanced to dys 3 textures and thin liquids which she is consuming with supervision cues for use of swallowing precautions.  Pt has been decannulated and has only mildly decreased vocal intensity post trach which at times impacts intelligibility in noisy environments.  Pt is overall min assist-supervision level for cognitive tasks due to mild cognitive deficits; however, suspect that some of these deficits are related to personality or possible baseline cognitive dysfunction in the setting of polysubstance abuse rather than acute deficits.  Would recommend that pt have 24/7 supervision at discharge but it is unclear if pt will actually have recommended level of support and she is currently refusing SNF placement.  Pt would benefit from ST follow up at next level of care to continue to address speech, swallowing, and cognition.    Care Partner:  Caregiver Able to Provide Assistance: Other (comment)(unclear of level of support available to pt at discharge )  Type of Caregiver Assistance: (uncertain, pt declining SNF placement)  Recommendation:  Home Health SLP;24 hour supervision/assistance  Rationale for SLP Follow Up: Maximize functional communication;Maximize cognitive function and independence   Equipment:  none recommended by SLP   Reasons for discharge: Discharged from hospital   Patient/Family Agrees with Progress Made and Goals Achieved: Yes    Emilio Math 12/24/2018, 3:40 PM

## 2018-12-24 NOTE — Progress Notes (Signed)
SLP Cancellation Note  Patient Details Name: Courtney Terry MRN: [id number] DOB: Oct 22, 1960   Cancelled treatment:        Pt with nursing upon therapist's arrival, being cleaned from bladder incontinence.  Pt initially agreeable to participating in therapies after begin cleaned up; however, she took a personal phone call shortly after beginning therapy sessions and became very emotionally involved in conversation with her family regarding their concerns about her resuming her substance use.  Therapist excused herself as pt gave no indication of ending phone conversation.                                                                                                  Jevon Shells, Selinda Orion 12/24/2018, 3:31 PM

## 2018-12-24 NOTE — Progress Notes (Signed)
[facility] PHYSICAL MEDICINE & REHABILITATION PROGRESS NOTE   Subjective/Complaints: No new complaints.   ROS: Patient denies fever, rash, sore throat, blurred vision, nausea, vomiting, diarrhea, cough, shortness of breath or chest pain, joint or back pain, headache, or mood change.    Objective:   No results found. No results for input(s): WBC, HGB, HCT, PLT in the last 72 hours. No results for input(s): NA, K, CL, CO2, GLUCOSE, BUN, CREATININE, CALCIUM in the last 72 hours.  Intake/Output Summary (Last 24 hours) at 12/24/2018 0851 Last data filed at 12/23/2018 1820 Gross per 24 hour  Intake 240 ml  Output -  Net 240 ml     Physical Exam: Vital Signs Blood pressure 113/70, pulse 63, temperature 98.7 F (37.1 C), resp. rate 14, height 5\' 10"  (2.841 m), weight 64.2 kg, SpO2 98 %. Constitutional: No distress . Vital signs reviewed. HEENT: EOMI, oral membranes moist Neck: supple Cardiovascular: RRR without murmur. No JVD    Respiratory: CTA Bilaterally without wheezes or rales. Normal effort    GI: BS +, non-tender, non-distended   Musc: No edema or tenderness in extremities. Skin: No evidence of breakdown, no evidence of rash Neurologic: Alert, no CN findings Motor: Grossly 4+/5 all 4's Psych: smiling and in good spirits   Assessment/Plan: 1. Functional deficits secondary to debilitiy from respiratory failure which require interdisciplinary therapy in a comprehensive inpatient rehab setting.  Physiatrist is providing close team supervision and 24 hour management of active medical problems listed below.  Physiatrist and rehab team continue to assess barriers to discharge/monitor patient progress toward functional and medical goals  Care Tool:  Bathing  Bathing activity did not occur: Refused Body parts bathed by patient: Right arm, Left arm, Chest, Abdomen, Right upper leg, Left upper leg, Face, Front perineal area, Buttocks, Right lower leg, Left lower leg   Body  parts bathed by helper: Front perineal area, Buttocks, Left lower leg, Right lower leg     Bathing assist Assist Level: Supervision/Verbal cueing(sitting on tub bench)     Upper Body Dressing/Undressing Upper body dressing Upper body dressing/undressing activity did not occur (including orthotics): Refused What is the patient wearing?: Pull over shirt    Upper body assist Assist Level: Set up assist    Lower Body Dressing/Undressing Lower body dressing    Lower body dressing activity did not occur: Refused What is the patient wearing?: Pants     Lower body assist Assist for lower body dressing: Set up assist(from bed level)     Toileting Toileting Toileting Activity did not occur Landscape architect and hygiene only): Refused  Toileting assist Assist for toileting: Contact Guard/Touching assist     Transfers Chair/bed transfer  Transfers assist     Chair/bed transfer assist level: Supervision/Verbal cueing     Locomotion Ambulation   Ambulation assist   Ambulation activity did not occur: Safety/medical concerns  Assist level: Supervision/Verbal cueing Assistive device: (none) Max distance: 50'   Walk 10 feet activity   Assist  Walk 10 feet activity did not occur: Refused  Assist level: Supervision/Verbal cueing Assistive device: (none)   Walk 50 feet activity   Assist Walk 50 feet with 2 turns activity did not occur: Safety/medical concerns  Assist level: Supervision/Verbal cueing Assistive device: (none)    Walk 150 feet activity   Assist Walk 150 feet activity did not occur: Safety/medical concerns         Walk 10 feet on uneven surface  activity   Assist Walk 10 feet  on uneven surfaces activity did not occur: Safety/medical concerns         Wheelchair     Assist Will patient use wheelchair at discharge?: Yes Type of Wheelchair: Manual Wheelchair activity did not occur: Refused  Wheelchair assist level:  Supervision/Verbal cueing Max wheelchair distance: 150'    Wheelchair 50 feet with 2 turns activity    Assist    Wheelchair 50 feet with 2 turns activity did not occur: Refused   Assist Level: Supervision/Verbal cueing   Wheelchair 150 feet activity     Assist Wheelchair 150 feet activity did not occur: Safety/medical concerns   Assist Level: Supervision/Verbal cueing    Medical Problem List and Plan: 1.Debility and metabolic encephalopathysecondary to acute on chronic respiratory failure status post tracheostomy 11/19/2018.Pulmonary services to follow-up on plan decannulation     -dc today  -have reviewed her substance abuse and health issues at length  -Patient to see Rehab MD/provider in the office for transitional care encounter in 1-2 weeks.  2.  DVT prophyllaxis Subcutaneous Lovenox 3. Pain Management:Tylenol as needed  4. Mood/bipolar disorder:Prozac 40 mg daily for mood stabilization, Klonopin 0.5 mg 3 times a day as needed for anxiety or agitation -fair sleep patterns at present 5. Neuropsych: This patientis potentiallycapable of making decisions on herown behalf. 6. Skin/Wound Care:Routine skin checks 7. Fluids/Electrolytes/Nutrition:   - eating very well    8. Chronic hypotension. Patient had been on ProAmatine 10 mg 3 times a day as well as Florinef prior to admission. Resume as neededpendng tolerance of therapy Vitals:   12/24/18 0713 12/24/18 0714  BP:    Pulse:    Resp:    Temp:    SpO2: 98% 98%   Asymptomatic hypotension on 3/19 9.COPD with history of tobacco abuse. Patient on 2 L oxygen at home.Continue chronic prednisone. Follow-up pulmonary services Dr.Wert.Continue nebulizersas directed  -decannulated without issue  10. Seizure disorder. ContinueDepakoteas prior to admission--change to depakote ER 500mg  daily to improve compliance.. EEG negative No clinical seizure activity at present 11.Hypothyroidism.  Synthroid 12. Dysphagia. Diet upgraded to D3 thins, tolerating well and eating more 13. Polysubstance abuse. UDS positive for cocaine. Provide counseling 14. History of breast cancer with lumpectomy. Patient on Arimidex 1 mg daily prior to admission.OK to restart per Dr Burr Medico Heme/Onc also Dr Burr Medico wants pt to f/u since she hasn't been seen since 2018 15.  Leukocytosis with chronic steroid use  Afebrile  WBCs 8.4 3/9---->11.3 3/16 16.  Medication compliance an ongoing issue. Daily ed by staff.  17. Sugars now normal---dc'ed  18.  Acute blood loss anemia  Hemoglobin 11.0 3/16     ELOS: 27 days A FACE TO FACE EVALUATION WAS PERFORMED  Meredith Staggers 12/24/2018, 8:51 AM

## 2018-12-24 NOTE — Progress Notes (Signed)
Physical Therapy Session Note  Patient Details  Name: Courtney Terry MRN: [id number] Date of Birth: Dec 20, 1960  Today's Date: 12/24/2018 PT Individual Time: 4742-5956 PT Individual Time Calculation (min): 45 min   Short Term Goals: Week 4:  PT Short Term Goal 1 (Week 4): =LTG due to ELOS  Skilled Therapeutic Interventions/Progress Updates:    Pt received supine in bed, agreeable to get up to chair for breakfast. No complaints of pain. Bed mobility and stand pivot transfer with Supervision. Pt is Supervision assist for breakfast. Manual w/c propulsion x 150 ft with use of BUE and Supervision. Stand pivot transfer to Nustep with Supervision. Nustep level 4 x 6 min with use of B UE/LE for global strengthening. Pt then requests chips as a snack, therapist provides Supervision assist while pt eats her chips. Pt on 2L O2 throughout session via nasal cannula, SpO2 WFL throughout session. Pt left seated in bed with needs in reach and bed alarm in place at end of session.  Therapy Documentation Precautions:  Precautions Precautions: Fall Precaution Comments: supplemental O2 Restrictions Weight Bearing Restrictions: No    Therapy/Group: Individual Therapy   Excell Seltzer, PT, DPT  12/24/2018, 10:28 AM

## 2018-12-24 NOTE — Progress Notes (Signed)
Discharged to home per wheelchair with oxygen with NT and friend. No further questions noted.

## 2018-12-24 NOTE — Progress Notes (Addendum)
Patient to discharge today waiting for taxi per social work power is on. Discharge instructions done by PA. No further questions noted.

## 2018-12-24 NOTE — Progress Notes (Signed)
Occupational Therapy Discharge Summary  Patient Details  Name: Courtney Terry MRN: [id number] Date of Birth: [birth year]  Today's Date: 12/24/2018    Patient has met 8 of 8 long term goals due to improved activity tolerance, improved balance and ability to compensate for deficits.  Patient to discharge at overall Supervision level.  Pt very self limiting during inpatient rehab stay and often refused participation in OT intervention. Her caregiver has been present in room during these sessions and have declined to participate in family training, observe treatments, and does not encourage pt to participate.   Reasons goals not met: all goals met  Recommendation:  Patient will benefit from ongoing skilled OT services in home health setting to continue to advance functional skills in the area of BADL and iADL.  Equipment: No equipment provided  Reasons for discharge: treatment goals met and discharge from hospital  Patient/family agrees with progress made and goals achieved: Yes  OT Discharge Precautions/Restrictions  Precautions Precautions: Fall Precaution Comments: supplemental O2 Restrictions Weight Bearing Restrictions: No Vital Signs Oxygen Therapy SpO2: 98 % O2 Device: Nasal Cannula O2 Flow Rate (L/min): 2 L/min Pain Pain Assessment Pain Scale: 0-10 Pain Score: 0-No pain ADL ADL Eating: Not assessed Grooming: Supervision/safety Where Assessed-Grooming: Edge of bed Upper Body Bathing: Minimal assistance(for thoroughness) Where Assessed-Upper Body Bathing: Edge of bed Lower Body Bathing: Maximal assistance Where Assessed-Lower Body Bathing: Bed level, Edge of bed Upper Body Dressing: Maximal assistance Where Assessed-Upper Body Dressing: Edge of bed Lower Body Dressing: Maximal assistance Where Assessed-Lower Body Dressing: Edge of bed Toileting: Not assessed Toilet Transfer: Moderate assistance Toilet Transfer Method: Stand pivot, Squat pivot Toilet Transfer  Equipment: Bedside commode, Drop arm bedside commode Tub/Shower Transfer: Not assessed Cognition Overall Cognitive Status: Impaired/Different from baseline Arousal/Alertness: Awake/alert Orientation Level: Oriented to person;Oriented to place Attention: Sustained Sustained Attention: Impaired Memory: Impaired Memory Impairment: Decreased recall of new information;Decreased short term memory Awareness: Impaired Awareness Impairment: Intellectual impairment Problem Solving: Impaired Behaviors: Restless;Impulsive;Verbal agitation;Physical agitation;Poor frustration tolerance Safety/Judgment: Impaired Sensation Sensation Light Touch: Appears Intact Proprioception: Appears Intact Coordination Gross Motor Movements are Fluid and Coordinated: No Fine Motor Movements are Fluid and Coordinated: No Coordination and Movement Description: impaired by generalized weakness Motor  Motor Motor: Other (comment);Abnormal postural alignment and control;Motor apraxia Motor - Discharge Observations: ongoing generalized weakness 2/2 muscle atrophy Mobility  Bed Mobility Bed Mobility: Rolling Right;Rolling Left;Supine to Sit;Sit to Supine Rolling Right: Supervision/verbal cueing Rolling Left: Supervision/Verbal cueing Supine to Sit: Supervision/Verbal cueing Sit to Supine: Supervision/Verbal cueing Transfers Sit to Stand: Supervision/Verbal cueing  Trunk/Postural Assessment  Cervical Assessment Cervical Assessment: Exceptions to WFL(forward head) Thoracic Assessment Thoracic Assessment: Exceptions to WFL(rounded shoulders) Lumbar Assessment Lumbar Assessment: Exceptions to WFL(posterior pelvic tilt) Postural Control Postural Control: Deficits on evaluation Righting Reactions: delayed  Balance Balance Balance Assessed: Yes Dynamic Sitting Balance Sitting balance - Comments: Supervision Extremity/Trunk Assessment RUE Assessment RUE Assessment: Within Functional Limits LUE Assessment LUE  Assessment: Within Functional Limits   Gypsy Decant 12/24/2018, 9:55 AM

## 2018-12-24 NOTE — Discharge Instructions (Signed)
Inpatient Rehab Discharge Instructions  Courtney Terry Discharge date and time: No discharge date for patient encounter.   Activities/Precautions/ Functional Status: Activity: activity as tolerated Diet: mechanical soft Wound Care: keep wound clean and dry Functional status:  ___ No restrictions     ___ Walk up steps independently ___ 24/7 supervision/assistance   ___ Walk up steps with assistance ___ Intermittent supervision/assistance  ___ Bathe/dress independently ___ Walk with walker     _x__ Bathe/dress with assistance ___ Walk Independently    ___ Shower independently ___ Walk with assistance    ___ Shower with assistance ___ No alcohol     ___ Return to work/school ________    COMMUNITY REFERRALS UPON DISCHARGE:    Home Health:   PT    OT     ST    RN                    Agency:  [facility]      Phone: [phone]   Medical Equipment/Items Ordered:  Wheelchair, cushion, oxygen                                                       Agency/Supplier:  [facility] @ [phone]       Special Instructions: No driving, smoking, alcohol or illicit drug use  Continue oxygen therapy as directed  Santyl to pressure wound on sacrum and cover wet-to-dry daily as needed   My questions have been answered and I understand these instructions. I will adhere to these goals and the provided educational materials after my discharge from the hospital.  Patient/Caregiver Signature _______________________________ Date __________  Clinician Signature _______________________________________ Date __________  Please bring this form and your medication list with you to all your follow-up doctor's appointments.

## 2018-12-28 ENCOUNTER — Telehealth: Payer: Self-pay | Admitting: Registered Nurse

## 2018-12-28 NOTE — Telephone Encounter (Signed)
Transitional Care call Transitional Call Completed  Patient name: Courtney Terry  DOB: 18-Nov-1960 1. Are you/is patient experiencing any problems since coming home? No a. Are there any questions regarding any aspect of care? No 2. Are there any questions regarding medications administration/dosing? No a. Are meds being taken as prescribed? Yes b. "Patient should review meds with caller to confirm" Medication List Reviewed 3. Have there been any falls? No 4. Has Home Health been to the house and/or have they contacted you? Yes, [facility] a. If not, have you tried to contact them? NA b. Can we help you contact them? NA 5. Are bowels and bladder emptying properly?Yes a. Are there any unexpected incontinence issues? No b. If applicable, is patient following bowel/bladder programs? NA 6. Any fevers, problems with breathing, unexpected pain? No 7. Are there any skin problems or new areas of breakdown? No 8. Has the patient/family member arranged specialty MD follow up (ie cardiology/neurology/renal/surgical/etc.)?  Ms. Heckel was instructed to call Dr. Melvyn Novas Pulmologist for [place] appointment, she verbalizes understanding.  a. Can we help arrange? NA 9. Does the patient need any other services or support that we can help arrange? No 10. Are caregivers following through as expected in assisting the patient? Yes 11. Has the patient quit smoking, drinking alcohol, or using drugs as recommended? Ms. Stallworth denies smoking, drinking alcohol or using illicit drugs.   Appointment date/time 12/30/2018  arrival time 10:20 for 10:40 appointment with Dr. Naaman Plummer . At [place]

## 2018-12-29 ENCOUNTER — Other Ambulatory Visit: Payer: Self-pay

## 2018-12-29 ENCOUNTER — Emergency Department (HOSPITAL_COMMUNITY): Payer: Medicaid Other

## 2018-12-29 ENCOUNTER — Emergency Department (HOSPITAL_COMMUNITY)
Admission: EM | Admit: 2018-12-29 | Discharge: 2018-12-29 | Disposition: A | Discharge status: Home / Self Care | Payer: Medicaid Other | Attending: Emergency Medicine | Admitting: Emergency Medicine

## 2018-12-29 ENCOUNTER — Encounter (HOSPITAL_COMMUNITY): Payer: Self-pay

## 2018-12-29 DIAGNOSIS — E039 Hypothyroidism, unspecified: Secondary | ICD-10-CM | POA: Insufficient documentation

## 2018-12-29 DIAGNOSIS — Y92002 Bathroom of unspecified non-institutional (private) residence single-family (private) house as the place of occurrence of the external cause: Secondary | ICD-10-CM | POA: Diagnosis not present

## 2018-12-29 DIAGNOSIS — S39012A Strain of muscle, fascia and tendon of lower back, initial encounter: Secondary | ICD-10-CM | POA: Insufficient documentation

## 2018-12-29 DIAGNOSIS — Y939 Activity, unspecified: Secondary | ICD-10-CM | POA: Insufficient documentation

## 2018-12-29 DIAGNOSIS — I509 Heart failure, unspecified: Secondary | ICD-10-CM | POA: Insufficient documentation

## 2018-12-29 DIAGNOSIS — Y999 Unspecified external cause status: Secondary | ICD-10-CM | POA: Diagnosis not present

## 2018-12-29 DIAGNOSIS — F17210 Nicotine dependence, cigarettes, uncomplicated: Secondary | ICD-10-CM | POA: Diagnosis not present

## 2018-12-29 DIAGNOSIS — X501XXA Overexertion from prolonged static or awkward postures, initial encounter: Secondary | ICD-10-CM | POA: Insufficient documentation

## 2018-12-29 DIAGNOSIS — J449 Chronic obstructive pulmonary disease, unspecified: Secondary | ICD-10-CM | POA: Insufficient documentation

## 2018-12-29 DIAGNOSIS — I110 Hypertensive heart disease with heart failure: Secondary | ICD-10-CM | POA: Insufficient documentation

## 2018-12-29 DIAGNOSIS — Z79899 Other long term (current) drug therapy: Secondary | ICD-10-CM | POA: Diagnosis not present

## 2018-12-29 MED ORDER — HYDROCODONE-ACETAMINOPHEN 5-325 MG PO TABS
2.0000 | ORAL_TABLET | Freq: Once | ORAL | Status: AC
  Administered 2018-12-29: 2 via ORAL
  Filled 2018-12-29: qty 2

## 2018-12-29 MED ORDER — OXYCODONE-ACETAMINOPHEN 5-325 MG PO TABS: 1.0000 | ORAL_TABLET | Freq: Four times a day (QID) | ORAL | 0 refills | Status: AC | PRN

## 2018-12-29 MED ORDER — OXYCODONE-ACETAMINOPHEN 5-325 MG PO TABS
1.0000 | ORAL_TABLET | Freq: Once | ORAL | Status: AC
  Administered 2018-12-29: 1 via ORAL
  Filled 2018-12-29: qty 1

## 2018-12-29 MED ORDER — CYCLOBENZAPRINE HCL 10 MG PO TABS
5.0000 mg | ORAL_TABLET | Freq: Once | ORAL | Status: AC
  Administered 2018-12-29: 5 mg via ORAL
  Filled 2018-12-29: qty 1
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
## 2018-12-29 NOTE — ED Triage Notes (Signed)
[facility] reports pt on 4 lpm O2 @ home. Pt felt a pop in her lower back when pulling up her pants after using the bathroom around 3:30 p today. Vital signs stable.

## 2018-12-29 NOTE — ED Notes (Signed)
Patient transported to X-ray 

## 2018-12-29 NOTE — Discharge Instructions (Signed)
Your x-ray shows that you have some mild arthritis in your lower back but there is no fracture or dislocation.  Please take the medication as prescribed.  This medication can have side effects that include dizziness, sedation, drowsiness, addiction and overdose. Thank you for allowing me to care for you today. Please return to the emergency department if you have new or worsening symptoms. Take your medications as instructed.

## 2018-12-29 NOTE — ED Notes (Signed)
Patient verbalizes understanding of discharge instructions. Opportunity for questioning and answers were provided. Armband removed by staff, pt discharged from ED.  

## 2018-12-30 ENCOUNTER — Encounter: Payer: Medicaid Other | Attending: Physical Medicine & Rehabilitation | Admitting: Physical Medicine & Rehabilitation

## 2018-12-30 ENCOUNTER — Encounter: Payer: Self-pay | Admitting: Physical Medicine & Rehabilitation

## 2018-12-30 ENCOUNTER — Telehealth: Payer: Medicaid Other | Admitting: Physical Medicine & Rehabilitation

## 2018-12-30 ENCOUNTER — Encounter: Payer: Medicaid Other | Admitting: Physical Medicine & Rehabilitation

## 2018-12-30 VITALS — BP 99/67 | HR 69 | Temp 98.9°F | Ht 71.0 in | Wt 109.0 lb

## 2018-12-30 DIAGNOSIS — J9601 Acute respiratory failure with hypoxia: Secondary | ICD-10-CM | POA: Diagnosis not present

## 2018-12-30 DIAGNOSIS — G9341 Metabolic encephalopathy: Secondary | ICD-10-CM | POA: Diagnosis not present
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
## 2019-01-04 ENCOUNTER — Other Ambulatory Visit: Payer: Self-pay | Admitting: Hematology

## 2019-01-04 ENCOUNTER — Other Ambulatory Visit: Payer: Self-pay | Admitting: Pulmonary Disease

## 2019-01-04 DIAGNOSIS — C50411 Malignant neoplasm of upper-outer quadrant of right female breast: Secondary | ICD-10-CM

## 2019-01-22 ENCOUNTER — Ambulatory Visit: Payer: Medicaid Other | Admitting: Pulmonary Disease

## 2019-02-17 ENCOUNTER — Telehealth: Payer: Self-pay | Admitting: Pulmonary Disease

## 2019-02-17 DIAGNOSIS — J9611 Chronic respiratory failure with hypoxia: Secondary | ICD-10-CM

## 2019-02-17 NOTE — Telephone Encounter (Signed)
Left message for patient to call back  

## 2019-02-18 NOTE — Telephone Encounter (Signed)
Last OV 10/30/18 with TN Pt request rx be sent to [facility] for POC. Is it ok to place order for a POC for 3 liters.   Route (nasal cannula OR mask): cannula Liter Flow: 3L pulsed at rest and with activity, 4lpm at bedtime Frequency (continuous with stationary and portable oxygen unit needed OR only at night): continuous with stationary and SimplyGo Mini Was this based off an ONO?: no DME: [facility] Oxygen Conserving Device (Yes or No): yes Type of Oxygen (Gas or Liquid): Gas

## 2019-02-18 NOTE — Telephone Encounter (Signed)
Okay to order?

## 2019-02-18 NOTE — Telephone Encounter (Signed)
Spoke with patient. She is aware that RA has approved the order. Will go ahead and place the order.   Nothing further needed at time of call.

## 2019-02-19 ENCOUNTER — Telehealth: Payer: Self-pay | Admitting: Pulmonary Disease

## 2019-02-19 NOTE — Telephone Encounter (Signed)
Left message for Courtney Terry to call back. 

## 2019-02-22 NOTE — Telephone Encounter (Signed)
LVM for Levada Dy with [facility] to return call back regarding concerns with pt O2.X1

## 2019-02-23 NOTE — Telephone Encounter (Signed)
LMTCB x3 for Levada Dy.

## 2019-02-24 ENCOUNTER — Other Ambulatory Visit: Payer: Self-pay | Admitting: Hematology

## 2019-02-24 DIAGNOSIS — C50411 Malignant neoplasm of upper-outer quadrant of right female breast: Secondary | ICD-10-CM

## 2019-02-24 NOTE — Telephone Encounter (Signed)
Attempted X 3 and no response. Per triage protocal, will close message.

## 2019-02-25 ENCOUNTER — Telehealth: Payer: Self-pay | Admitting: Pulmonary Disease

## 2019-02-25 NOTE — Telephone Encounter (Signed)
Levada Dy is calling Rodena Piety back [phone]

## 2019-02-25 NOTE — Telephone Encounter (Signed)
Called and spoke with pt who stated she is still waiting for the [employer] from [facility] and stated that they told her they never received an order. I stated to pt that we did place an order 5/14 to [facility] but pt is still saying that the order was not received.  [facility], can you please help [place] out with this? Thanks!

## 2019-02-25 NOTE — Telephone Encounter (Signed)
Courtney Terry with [facility] just called me back and stated that the sister Courtney Terry had her 12 with [facility] pick up in November 2019 and was not sure which DME the sister is using now. Courtney Terry stated that she called and left message for Courtney Terry that she needed to talk with her sister to find out what DME company she is using now. She did not get the Oxy Go Fit from [facility]

## 2019-02-25 NOTE — Telephone Encounter (Signed)
I called and spoke with Courtney Terry with [facility] and he states that Oxy Go Fit is not provided by them. Courtney Terry already has the smallest POC that is available through [facility] the Ox Life Freedom which weighs 5 lbs. When I called Courtney Terry she stated that [facility] ordered an [place] for her sister Courtney Terry. She stated that we have to send an order to [facility] for them to order the [place] for her. I then called Courtney Terry with [facility] and she told me the same thing that Courtney Terry did earlier. [facility] does not have the Oxy Go Fit on their formulary list of POCs. I ask if someone could check to see how Courtney. Courtney Terry got them to order the [place] for her. Courtney Terry stated that they couldn't look up one patient to figure out what was done to help another patient. Courtney Terry stated she would have to get a supervisor to check to see what could be done. Courtney Terry stated she would call us and the patient back when she found out something

## 2019-03-04 ENCOUNTER — Telehealth: Payer: Self-pay | Admitting: Pulmonary Disease

## 2019-03-04 DIAGNOSIS — J9611 Chronic respiratory failure with hypoxia: Secondary | ICD-10-CM

## 2019-03-04 DIAGNOSIS — J449 Chronic obstructive pulmonary disease, unspecified: Secondary | ICD-10-CM

## 2019-03-04 NOTE — Telephone Encounter (Signed)
Left message for patient to call back  

## 2019-03-05 NOTE — Telephone Encounter (Signed)
Spoke with patient. She wants to switch to [facility] in order to get the OxyGoFit since [facility] does not offer this device. She also wants to know if she can have an order sent to [facility] since she will be switching to [facility]. Advised her I would need to check with the provider first. She verbalized understanding.   RA, please advise if you are ok with [place] placing these orders. Thanks!

## 2019-03-05 NOTE — Telephone Encounter (Signed)
Attempted to call pt but unable to reach and unable to leave a VM. Will try to call back later. 

## 2019-03-05 NOTE — Telephone Encounter (Signed)
Called & spoke w/ pt to let her know we will be placing the order to switch to [facility] and for [facility] to provide her with the OxyGoFit since [facility] does not offer this device. Pt verbalized understanding with no additional questions.   Ambulatory referral order has been placed per RA. Nothing further needed at this time.

## 2019-03-05 NOTE — Telephone Encounter (Signed)
Okay to place orders 

## 2019-03-05 NOTE — Telephone Encounter (Signed)
Pt returning call can be reached at  [phone]

## 2019-03-08 ENCOUNTER — Telehealth: Payer: Self-pay | Admitting: Pulmonary Disease

## 2019-03-08 NOTE — Telephone Encounter (Signed)
Spoke with pt, she made an appt to see TN on 03/17/2019 at 3:00pm. I called [facility] to let them know but they were closed. We can try again tomorrow.

## 2019-03-09 ENCOUNTER — Telehealth: Payer: Self-pay | Admitting: Pulmonary Disease

## 2019-03-09 DIAGNOSIS — J9611 Chronic respiratory failure with hypoxia: Secondary | ICD-10-CM

## 2019-03-09 NOTE — Telephone Encounter (Signed)
Called [facility] and made Gilda aware patient has appt 6/10. Order will be placed on 6/10. Nothing further needed.

## 2019-03-09 NOTE — Telephone Encounter (Signed)
Called and spoke with Patient.  Patient requested new tubing for neb machine.  Patient stated she received her neb machine from [facility].  Called [facility] and was told they have not supplied a neb machine for patient.  [facility] supplies oxygen and wheelchair for Patient.   Per 06/22/18, OV with Aaron Edelman, NP, Pulmicort neb was d/c. Last OV was with Kenney Houseman, NP 10/30/18, for COPD. Called and spoke with Patient. Patient stated she was placed back on nebs after 11/27/18, hospital visit.  Per 11/27/18, Patient was placed on Budesonide BID. Patient stated she had neb machine several years and she could not think of any other DME company she may have used.  Message routed to [facility], NP to advise on neb machine/supplies order   10/30/18, last OV with Kenney Houseman, NP  Instructions      Return in about 3 months (around 01/29/2019) for routine follow up Dr. Elsworth Soho.  Continue Bevespi 2 puffs twice daily - sample given Continue Continue Proventil Continue prednisone 10 mg daily Continue O2 at 2L [place] continuously Follow up with Dr. Elsworth Soho in 3 months or sooner if needed

## 2019-03-10 MED ORDER — GLYCOPYRROLATE-FORMOTEROL 9-4.8 MCG/ACT IN AERO: 2.0000 | INHALATION_SPRAY | Freq: Two times a day (BID) | RESPIRATORY_TRACT | 0 refills | Status: DC

## 2019-03-10 MED ORDER — GLYCOPYRROLATE-FORMOTEROL 9-4.8 MCG/ACT IN AERO: 2.0000 | INHALATION_SPRAY | Freq: Two times a day (BID) | RESPIRATORY_TRACT | 5 refills | Status: DC

## 2019-03-10 NOTE — Telephone Encounter (Signed)
Yes. This would be fine. Thanks.  

## 2019-03-10 NOTE — Telephone Encounter (Signed)
Patient is returning phone call.  Patient phone number is [phone].

## 2019-03-10 NOTE — Telephone Encounter (Signed)
Yes. Please place order for neb supplies. Thanks.

## 2019-03-10 NOTE — Telephone Encounter (Signed)
Bevespi samples placed at front door for pick up.  New bevespi prescription placed with pharmacy per Kenney Houseman, NP recommendations. Left message with Patient.  Nothing further at this time.

## 2019-03-10 NOTE — Telephone Encounter (Signed)
Called & spoke w/ pt regarding TN's recommendations. Pt verbalized understanding. Order for neb supplies has been placed to [facility].   Pt stated she would also like to have a prescription and samples of Bevespi, a trial maintenance inhaler RA started her on 10/30/2018. Pt states the inhaler works well for her and she would like to continue with therapy, however, insurance will not cover a prescription until 03/22/2019, therefore, pt states she would need samples until then.   TN, please advise if you would be okay with supplying this pt with samples and a prescription of Bevespi. Thank you.

## 2019-03-11 ENCOUNTER — Telehealth: Payer: Self-pay | Admitting: Pulmonary Disease

## 2019-03-11 DIAGNOSIS — J9611 Chronic respiratory failure with hypoxia: Secondary | ICD-10-CM

## 2019-03-11 DIAGNOSIS — J9622 Acute and chronic respiratory failure with hypercapnia: Secondary | ICD-10-CM

## 2019-03-11 NOTE — Telephone Encounter (Signed)
Order for nebulizer supplies placed Pt aware Nothing further needed.

## 2019-03-11 NOTE — Telephone Encounter (Signed)
Order replaced. Nothing further needed.

## 2019-03-11 NOTE — Telephone Encounter (Signed)
TN is it ok to place DME order for nebulizer supplies? I see pt has upcoming appt on 6/10 with you.

## 2019-03-11 NOTE — Telephone Encounter (Signed)
Yes. I thought this was done already.

## 2019-03-12 ENCOUNTER — Other Ambulatory Visit: Payer: Self-pay | Admitting: Nurse Practitioner

## 2019-03-12 DIAGNOSIS — J9621 Acute and chronic respiratory failure with hypoxia: Secondary | ICD-10-CM

## 2019-03-12 NOTE — Telephone Encounter (Signed)
Wrong order cancelled out on yesterday. For some reason if too many spaces when [facility] prints order if will not have all of info on order. Order re-submitted Nothing further needed.

## 2019-03-12 NOTE — Telephone Encounter (Signed)
Hey this order was wrong needs length of need for the neb machine itself. Thanks

## 2019-03-12 NOTE — Addendum Note (Signed)
Addended by: Stephanie Coup on: 03/12/2019 09:44 AM   Modules accepted: Orders

## 2019-03-17 ENCOUNTER — Ambulatory Visit: Payer: Medicaid Other | Admitting: Nurse Practitioner

## 2019-03-30 ENCOUNTER — Telehealth: Payer: Self-pay | Admitting: Pulmonary Disease

## 2019-03-30 ENCOUNTER — Other Ambulatory Visit: Payer: Self-pay | Admitting: Hematology

## 2019-03-30 DIAGNOSIS — C50411 Malignant neoplasm of upper-outer quadrant of right female breast: Secondary | ICD-10-CM

## 2019-03-30 MED ORDER — PREDNISONE 10 MG PO TABS: 10.0000 mg | ORAL_TABLET | Freq: Every day | ORAL | 1 refills | Status: DC

## 2019-03-30 NOTE — Telephone Encounter (Signed)
Left message for patient to call back  

## 2019-03-30 NOTE — Telephone Encounter (Signed)
Spoke with pt and advised rx for prednisone sent to pharmacy. Nothing further is needed.     Assessment & Plan:   Chronic respiratory failure with hypoxia (HCC) Continue O2 at 2 L [place] continuously  COPD (chronic obstructive pulmonary disease) ([place]) Patient has been stable since hospital discharge.  She is currently out of her medications.  We will refill but with this being and give sample.  Will refill prednisone daily.  Will refill albuterol.  Patient Instructions  Continue Bevespi 2 puffs twice daily - sample given Continue Continue Proventil Continue prednisone 10 mg daily Continue O2 at 2L [place] continuously Follow up with Dr. Elsworth Soho in 3 months or sooner if needed

## 2019-03-30 NOTE — Telephone Encounter (Signed)
Patient is returning phone call.  Patient phone number is [phone].

## 2019-04-02 ENCOUNTER — Telehealth: Payer: Self-pay | Admitting: General Surgery

## 2019-04-02 ENCOUNTER — Telehealth: Payer: Self-pay | Admitting: Pulmonary Disease

## 2019-04-02 NOTE — Telephone Encounter (Signed)
Called the patient and advised her of the recommendation to go to ED at [facility]. Patient has difficulty with transportation but will try to get someone to take her to the ED. I told her that if she cannot get someone to try contacting emergency services.  Patient phone started breaking up, but she stated she has been at home, and has not had that many people coming to her home. She will try to get her sister to take her to the ED.  Advised the patient that the appointment for Monday would he cancelled and I would call her next week to check on her. I recommended that she make sure that she and the person driving her wear a mask. To wash her hands before she leaves home and have hand sanitizer with her to use. Advised her to sit in the back seat and the driver have a mask on and sit up front. I told her to try to keep as much distance between themselves as possible.  Patient voiced understanding. Nothing further needed at this time.

## 2019-04-02 NOTE — Telephone Encounter (Signed)
Message routed to app of the day, Lazaro Arms, NP  Kenney Houseman: I called this patient who is on your schedule for 04/05/19 at 3:00 to ask the covid screen questions.   She stated that for the last two days she has been vomiting and has a fever. Patient does not have a thermometer to check her temperature, but said she knows she has a fever because she has been sweating.  The patient wants to reschedule to another time for her qualifying walk and confirmed she is still using 2L of O2.  Based on what the patient stated, did you want to set her up for a televisit for today to further assess her?

## 2019-04-02 NOTE — Telephone Encounter (Signed)
Please have patient go to the ED at [facility] if she has fever and vomiting. She may need to checked for Covid. Please reschedule appointment for 2 weeks out after symptoms resolve.

## 2019-04-02 NOTE — Telephone Encounter (Signed)
Spoke with the pt  She was calling back to let Hinton Dyer know that she is actually not running a fever  Her temp is 97.0  She is not having diarrhea and stated no to all of the other covid screening questions  She states that she still wants to keep the appt cancelled for 04/05/19 bc she has no transportation  She will reschedule for 04/07/2019 and will seek emergent care if does have any of these symptoms in the meantime  She is aware we will call her to screen day prior to the pending appt  Nothing further needed

## 2019-04-05 ENCOUNTER — Ambulatory Visit: Payer: Medicaid Other | Admitting: Nurse Practitioner

## 2019-04-07 ENCOUNTER — Encounter: Payer: Self-pay | Admitting: Nurse Practitioner

## 2019-04-07 ENCOUNTER — Ambulatory Visit: Payer: Medicaid Other | Admitting: Nurse Practitioner

## 2019-04-07 ENCOUNTER — Other Ambulatory Visit: Payer: Self-pay

## 2019-04-07 ENCOUNTER — Ambulatory Visit (INDEPENDENT_AMBULATORY_CARE_PROVIDER_SITE_OTHER): Payer: Medicaid Other | Admitting: Nurse Practitioner

## 2019-04-07 VITALS — BP 130/68 | HR 127 | Temp 98.3°F | Ht 70.0 in | Wt 122.8 lb

## 2019-04-07 DIAGNOSIS — J449 Chronic obstructive pulmonary disease, unspecified: Secondary | ICD-10-CM | POA: Diagnosis not present

## 2019-04-07 DIAGNOSIS — J9611 Chronic respiratory failure with hypoxia: Secondary | ICD-10-CM | POA: Diagnosis not present

## 2019-04-07 MED ORDER — BEVESPI AEROSPHERE 9-4.8 MCG/ACT IN AERO: 2.0000 | INHALATION_SPRAY | Freq: Two times a day (BID) | RESPIRATORY_TRACT | 0 refills | Status: DC

## 2019-04-07 NOTE — Addendum Note (Signed)
Addended by: Nena Polio on: 04/07/2019 05:25 PM   Modules accepted: Orders

## 2019-04-07 NOTE — Assessment & Plan Note (Signed)
Patient presents today for qualifying walk for POC.  Patient is normally on O2 at 3-4 L nasal cannula continuously.  Like to try to get a smaller portable oxygen tank to carry to doctor's appointments, etc. Patient is requesting Oxy Go fit POC.  She is compliant with Bevespi and prednisone.  Patient does still smoke. Patient is requesting to switch D[facility] to [facility]. Denies f/c/s, n/v/d, hemoptysis, PND, leg swelling.    Note: Patient walked in office today and sats dropped to 83% she was placed on Pulsed POC and could not maintain sats above 88%. Patient was walked on Continuous flow O2 at 4 L and sats remained above 96% the entire walk.   Patient Instructions  Chronic Respiratory Failure: Patient is requesting to switch DME company [facility] to [facility]. Continue O2 at 3 L at rest and 4 L while ambulating  COPD: Continue Bevespi 2 puffs twice daily - sample given Continue Continue Proventil Continue prednisone 10 mg daily  Active smoker: Please quit smoking   Follow up: Follow up with Dr. Elsworth Soho in 3 months or sooner if needed
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
## 2019-04-07 NOTE — Patient Instructions (Addendum)
Chronic Respiratory Failure: Patient is requesting to switch DME company from [facility] to [facility]. Continue O2 at 3 L at rest and 4 L while ambulating  COPD: Continue Bevespi 2 puffs twice daily - sample given Continue Continue Proventil Continue prednisone 10 mg daily  Active smoker: Please quit smoking   Follow up: Follow up with Dr. Elsworth Soho in 3 months or sooner if needed

## 2019-04-08 ENCOUNTER — Telehealth: Payer: Self-pay | Admitting: Nurse Practitioner

## 2019-04-08 ENCOUNTER — Telehealth: Payer: Self-pay | Admitting: Pulmonary Disease

## 2019-04-08 NOTE — Telephone Encounter (Signed)
Call returned to [facility], she is with [facility] and she is not a patient of theirs. Will forward message to [facility] pool to have them send the order to [facility].   [facility], please send order to [facility] per [facility] she is a patient with [facility].

## 2019-04-08 NOTE — Addendum Note (Signed)
Addended by: Nena Polio on: 04/08/2019 03:44 PM   Modules accepted: Orders

## 2019-04-08 NOTE — Telephone Encounter (Signed)
Talked with the patient yesterday during her office visit with Lazaro Arms, NP and advised her that the DME was changed to [place] earlier this year.  The patient received the POC she has now based on the order that was placed in May 2020. It is too soon for a replacement.  The patient told me during her visit that she wanted one like her sister's. I let the patient know that order was filled based on what the DME company has available. Patient voiced understanding. Nothing further was needed.

## 2019-04-08 NOTE — Telephone Encounter (Signed)
Spoke with Hinton Dyer who placed the order yesterday. She stated that she will replace the order. Will close this encounter.

## 2019-04-12 ENCOUNTER — Telehealth: Payer: Self-pay | Admitting: Pulmonary Disease

## 2019-04-12 DIAGNOSIS — J449 Chronic obstructive pulmonary disease, unspecified: Secondary | ICD-10-CM

## 2019-04-12 NOTE — Telephone Encounter (Signed)
The order was placed at last visit, but it appears there was some confusion following that order and clear documentation for the DME companies.  Per the other 04/12/19 phone note, [facility] received a staff message from [place] stating that patient would like to switch to [place] because she would like something more portable than what she is able to get from [facility] which is verified in the 7.1.2020 office visit with Kenney Houseman NP.   Called spoke with patient who reported that she has already spoken with [facility] and is unable to switch to that DME company because their smallest device is pulsed and she is unable to use this because it does not sustain her O2 sats at or above 90%.  Patient would like to continue with [facility] but change to a portable system that will provide continuous O2.  Patient would like this done ASAP.  Order placed as STAT to ensure patient has adequate O2 at home.  Nothing further needed; will sign off.

## 2019-04-12 NOTE — Telephone Encounter (Signed)
Message was sent to [facility] and they have received the order

## 2019-04-12 NOTE — Telephone Encounter (Signed)
I received this message from [place], Valentina Lucks, Allyne Gee, [place]; [place], Jeanie Cooks, [place] L        She is currently our patient, per her office notes with your office she is asking for a oxy go fit, and she is asking to switch from [facility] for her oxygen to [facility]. She has the most portable system we have.   Chronic Respiratory Failure:  Patient is requesting to switch DME company from [facility] to [facility].  Continue O2 at 3 L at rest and 4 L while ambulating   Previous Messages  ----- Message -----  From: Harland German  Sent: 04/08/2019  4:56 PM EDT  To: Darlina Guys, Elon Alas, *  Subject: oxygen                      06/26/1961  Order placed by Lazaro Arms NP please advise. Thanks Chantel

## 2019-04-12 NOTE — Telephone Encounter (Signed)
See other 04/12/19 phone note

## 2019-04-12 NOTE — Addendum Note (Signed)
Addended by: Parke Poisson E on: 04/12/2019 01:47 PM   Modules accepted: Orders

## 2019-04-12 NOTE — Addendum Note (Signed)
Addended by: Parke Poisson E on: 04/12/2019 02:31 PM   Modules accepted: Orders

## 2019-04-13 ENCOUNTER — Telehealth: Payer: Self-pay | Admitting: Pulmonary Disease

## 2019-04-13 DIAGNOSIS — J449 Chronic obstructive pulmonary disease, unspecified: Secondary | ICD-10-CM

## 2019-04-13 NOTE — Telephone Encounter (Signed)
Call returned to patient, she is requesting the status update of her oxygen tanks.   To: Darlina Guys, Elon Alas, *  Subject: RE: POC                      She still has the oxlife freedom she had in May, and it is the most portable thing we have. If it is too heavy for her then she may have to switch to the homefill where she fills her tanks at home, or look at alternatives with other companies.   Patient is very confused about her oxygen and what she should be doing. I made this patient aware she should be using 3L at rest and 4L while ambulating. She states she is wanting to know when she will get her new continous oxygen.  I made this patient aware she already has the most portable tank they have and now that she is using continous oxygen she will need bigger tanks. I also made her aware that the smaller tanks do not last long. She states she would be willing to use the tanks that are similar to the ones that we have in office.   Call made to [facility], spoke with [name] Mcalpine, she states we will need to place a new order because the patient is switching from pulsed to continous air flow.   Call made to patient, made aware a new order had to be placed being that she is switching from pulsed to continous. I made her aware someone from [facility] would be reaching out to her. Voiced understanding. Nothing further is needed at this time.

## 2019-04-13 NOTE — Telephone Encounter (Signed)
I called and spoke to [facility] the patient is on the smallest thing they have and it's continuous.

## 2019-04-13 NOTE — Telephone Encounter (Signed)
Called and spoke with pt stating to her the info we received from [place].   What pt is needing is a POC that is continuous flow, not pulsed flow and she is wanting to know if the POC that she has is continuous flow because if it is not, she is needing one ASAP that is continuous flow due to her O2 dropping fast on the pulsed flow.

## 2019-04-13 NOTE — Telephone Encounter (Signed)
I got this message from [facility] this morning   [place], Mariann Barter, Samuel Germany; [place], Allyne Gee, [place]; Elon Alas        This was the patient who said we had ordered her sister an oxy go fit special order and come to find out her sister had switched dme providers. This patient has the most portable thing WE have.   Previous Messages  ----- Message -----  From: Mariann Barter  Sent: 04/13/2019  9:25 AM EDT  To: Darlina Guys, Elon Alas, *  Subject: RE: POC                      She still has the oxlife freedom she had in May, and it is the most portable thing we have. If it is too heavy for her then she may have to switch to the homefill where she fills her tanks at home, or look at alternatives with other companies.   ----- Message -----  From: Harland German  Sent: 04/13/2019  8:16 AM EDT  To: Teryl Lucy, *  Subject: POC                        [birth year]  Order placed by Lazaro Arms NP please advise. Thanks Chantel

## 2019-04-15 ENCOUNTER — Telehealth: Payer: Self-pay | Admitting: Pulmonary Disease

## 2019-04-15 DIAGNOSIS — J449 Chronic obstructive pulmonary disease, unspecified: Secondary | ICD-10-CM

## 2019-04-15 MED ORDER — PREDNISONE 10 MG PO TABS: 10.0000 mg | ORAL_TABLET | Freq: Every day | ORAL | 5 refills | Status: DC

## 2019-04-15 NOTE — Telephone Encounter (Signed)
Called and spoke with Melissa from [facility]. Per Melissa, pt called them to discuss her oxygen. Melissa said that they were sending pt out a big tank as pt has told them that she has been using that tank when she is at home which is supposed to be her backup tank incase power went out. Pt is supposed to be using her concentrator when she is at home and is to only use the backup tank in emergency cases (if power goes out). Melissa wanted to know if we could contact pt to try to explain this to pt and I told Melissa I would call pt.  Called pt but unable to reach. Left message for pt to return call.

## 2019-04-15 NOTE — Telephone Encounter (Signed)
Patient is returning phone call.  Patient phone number is [phone].

## 2019-04-15 NOTE — Telephone Encounter (Signed)
Spoke with pt. She is aware of what she needs to be using for oxygen when she is at home. Nothing further was needed at this time.

## 2019-04-15 NOTE — Telephone Encounter (Signed)
Spoke with pt. She is needing an order sent to [facility] for large continuous flow oxygen tanks. States that [facility] is trying to bring her small tanks that are pulsed dose. Order has been placed and marked urgent. Pt also needs a refill on her daily prednisone. Rx has been sent in. Nothing further was needed.

## 2019-04-26 ENCOUNTER — Telehealth: Payer: Self-pay | Admitting: Pulmonary Disease

## 2019-04-26 MED ORDER — BEVESPI AEROSPHERE 9-4.8 MCG/ACT IN AERO: 2.0000 | INHALATION_SPRAY | Freq: Two times a day (BID) | RESPIRATORY_TRACT | 5 refills | Status: DC

## 2019-04-26 NOTE — Telephone Encounter (Signed)
Called and spoke with pt.stated to pt that an Rx was sent to a different pharmacy 6/3.per pt, she was needing Rx to be sent to the pharmacy she has listed as her preferred as the other pharmacy will not bring meds to pt. I have sent Rx for Bevespi to listed pharmacy by pt. Nothing further needed.

## 2019-04-27 ENCOUNTER — Telehealth: Payer: Self-pay | Admitting: Pulmonary Disease

## 2019-04-27 NOTE — Telephone Encounter (Signed)
Called & spoke w/ pt. Pt states she would like a refill of her blood pressure medication [facility] [address]), but she could not verbalized the medication name. Upon studying her medication list, I could not find a blood pressure medication on file. I let her know of this and she requested I check with her pharmacy.   I called & spoke w/ [facility] and the pharmacy tech I spoke to stated they do not have a blood pressure medication on file for pt either.   I called pt back to let her know that [facility] does not have a blood pressure medication on file. Pt verbalized understanding and stated she will call her other pharmacy, [facility], to see if they have one on file for her. I suggested to pt to call her PCP to verify if she is on a blood pressure medication, as Dr. Elsworth Soho is her lung doctor and only prescribes her Bevespi inhaler. Pt verbalized understanding with no additional questions. Nothing further needed at this time.

## 2019-04-30 ENCOUNTER — Other Ambulatory Visit: Payer: Self-pay

## 2019-04-30 DIAGNOSIS — C50411 Malignant neoplasm of upper-outer quadrant of right female breast: Secondary | ICD-10-CM

## 2019-04-30 MED ORDER — ANASTROZOLE 1 MG PO TABS: 1.0000 mg | ORAL_TABLET | Freq: Every day | ORAL | 0 refills | Status: DC

## 2019-05-11 ENCOUNTER — Other Ambulatory Visit: Payer: Self-pay | Admitting: Nurse Practitioner

## 2019-05-13 ENCOUNTER — Telehealth: Payer: Self-pay | Admitting: Pulmonary Disease

## 2019-05-13 DIAGNOSIS — J9611 Chronic respiratory failure with hypoxia: Secondary | ICD-10-CM

## 2019-05-13 NOTE — Telephone Encounter (Signed)
Spoke with pt, she states she would like to request the 5 liter tanks because they are easier to carry. Can we send an order to [facility]? Please advise Dr. Elsworth Soho.

## 2019-05-14 NOTE — Addendum Note (Signed)
Addended by: Lorretta Harp on: 05/14/2019 04:18 PM   Modules accepted: Orders

## 2019-05-14 NOTE — Telephone Encounter (Signed)
Order was placed as RA for the provider but due to him not being at office, Beth, please advise if we can place order under you for the O2 tanks and O2 tubing. Thanks!

## 2019-05-14 NOTE — Telephone Encounter (Signed)
Pt returning call and can be reached @ [phone].Courtney Terry

## 2019-05-14 NOTE — Telephone Encounter (Signed)
Okay to send order. 

## 2019-05-14 NOTE — Telephone Encounter (Signed)
New order has been placed under [employer]. Nothing further needed.

## 2019-05-14 NOTE — Telephone Encounter (Signed)
Order sent  Spoke with the pt and notified her that this was done  Nothing further needed

## 2019-05-14 NOTE — Telephone Encounter (Signed)
LMTCB x1 for pt.  

## 2019-05-14 NOTE — Telephone Encounter (Signed)
yes

## 2019-05-20 ENCOUNTER — Other Ambulatory Visit: Payer: Self-pay | Admitting: Hematology

## 2019-05-20 ENCOUNTER — Telehealth: Payer: Self-pay | Admitting: Hematology

## 2019-05-20 ENCOUNTER — Telehealth: Payer: Self-pay

## 2019-05-20 DIAGNOSIS — Z1231 Encounter for screening mammogram for malignant neoplasm of breast: Secondary | ICD-10-CM

## 2019-05-20 NOTE — Telephone Encounter (Signed)
Scheduled appt per 8/13 sch message - pt is aware of appt date and time.  

## 2019-05-20 NOTE — Telephone Encounter (Signed)
I will send a schedule message for her lab, f/u and mammogram, thanks   Truitt Merle MD

## 2019-05-20 NOTE — Telephone Encounter (Signed)
Patient calls stating that Dr. Burr Medico needs to set up for her a mammogram.  According to our records patient was last seen by Dr. Burr Medico on 08/14/2017 and no showed for her f/u appointment on 02/25/18.

## 2019-05-25 ENCOUNTER — Other Ambulatory Visit: Payer: Self-pay | Admitting: Hematology

## 2019-05-25 DIAGNOSIS — C50411 Malignant neoplasm of upper-outer quadrant of right female breast: Secondary | ICD-10-CM

## 2019-05-25 NOTE — Telephone Encounter (Signed)
F/U 06-18-2019

## 2019-05-25 NOTE — Telephone Encounter (Signed)
Refill request

## 2019-06-11 ENCOUNTER — Telehealth: Payer: Self-pay | Admitting: Pulmonary Disease

## 2019-06-11 MED ORDER — BEVESPI AEROSPHERE 9-4.8 MCG/ACT IN AERO: 2.0000 | INHALATION_SPRAY | Freq: Two times a day (BID) | RESPIRATORY_TRACT | 5 refills | Status: DC

## 2019-06-11 MED ORDER — ALBUTEROL SULFATE HFA 108 (90 BASE) MCG/ACT IN AERS: 2.0000 | INHALATION_SPRAY | RESPIRATORY_TRACT | 5 refills | Status: DC | PRN

## 2019-06-11 NOTE — Telephone Encounter (Signed)
Spoke with pt. She is needing refills on Proair and Bevespi. Both prescriptions have been sent in. Nothing further was needed.

## 2019-06-15 ENCOUNTER — Telehealth: Payer: Self-pay | Admitting: Hematology

## 2019-06-15 ENCOUNTER — Telehealth: Payer: Self-pay

## 2019-06-15 NOTE — Telephone Encounter (Signed)
Patient calls stating she is unable to keep her appointments this week and would like to reschedule, a scheduling message was sent to reflect this.

## 2019-06-15 NOTE — Telephone Encounter (Signed)
R/s appt per 9/8 sch message - pt aware of new appt date and time

## 2019-06-18 ENCOUNTER — Other Ambulatory Visit: Payer: Medicaid Other

## 2019-06-18 ENCOUNTER — Ambulatory Visit: Payer: Medicaid Other | Admitting: Hematology

## 2019-06-21 ENCOUNTER — Telehealth: Payer: Self-pay | Admitting: Pulmonary Disease

## 2019-06-21 MED ORDER — PREDNISONE 10 MG PO TABS: 10.0000 mg | ORAL_TABLET | Freq: Every day | ORAL | 2 refills | Status: DC

## 2019-06-21 NOTE — Telephone Encounter (Signed)
Per chart, pt is on 10mg  daily prednisone (maintenance dose)  Pt requesting a refill on this med.  This has been sent to preferred pharmacy.  Nothing further needed at this time- will close encounter.

## 2019-07-01 ENCOUNTER — Other Ambulatory Visit: Payer: Self-pay

## 2019-07-01 ENCOUNTER — Telehealth: Payer: Self-pay | Admitting: Pulmonary Disease

## 2019-07-01 ENCOUNTER — Telehealth: Payer: Self-pay | Admitting: Hematology

## 2019-07-01 DIAGNOSIS — Z170 Estrogen receptor positive status [ER+]: Secondary | ICD-10-CM

## 2019-07-01 DIAGNOSIS — C50411 Malignant neoplasm of upper-outer quadrant of right female breast: Secondary | ICD-10-CM

## 2019-07-01 NOTE — Telephone Encounter (Signed)
Returned patient's phone call regarding rescheduling 09/25 appointments, per patient's request appointment moved to 10/16.

## 2019-07-01 NOTE — Telephone Encounter (Signed)
Patient is calling back again. CB [phone]

## 2019-07-01 NOTE — Telephone Encounter (Signed)
ATC patient unable to reach LM to call back office (x1)  

## 2019-07-02 ENCOUNTER — Inpatient Hospital Stay: Payer: Medicaid Other | Admitting: Hematology

## 2019-07-02 ENCOUNTER — Inpatient Hospital Stay: Payer: Medicaid Other

## 2019-07-02 NOTE — Telephone Encounter (Signed)
Patient is returning the call. [phone]

## 2019-07-02 NOTE — Telephone Encounter (Signed)
Opened by accident

## 2019-07-02 NOTE — Telephone Encounter (Signed)
Spoke with patient. She wants bigger tanks. Per last OV with [place] patient is supposed to be on 4L continuous. I placed OV note in CCnote from [place] with [place]. Patient currently has 6L flow and she is running out of oxygen when she takes her portable tanks out with her. She is asking for 10L portable tanks so she doesn't run out while she is at church.   I let patient know I would send this to Dr. Elsworth Soho but he would not be back in office until wednesady 10/1. Patient voiced understanding.   Dr. Elsworth Soho please advise if patient can have order sent to 10L portable tanks DME [facility]

## 2019-07-03 NOTE — Telephone Encounter (Signed)
Please check with DME, this would make the portable time heavier but okay to order if patient wants this

## 2019-07-05 ENCOUNTER — Telehealth: Payer: Self-pay | Admitting: Pulmonary Disease

## 2019-07-05 MED ORDER — PREDNISONE 10 MG PO TABS: 10.0000 mg | ORAL_TABLET | Freq: Every day | ORAL | 2 refills | Status: DC

## 2019-07-05 NOTE — Telephone Encounter (Signed)
Spoke with rep with [facility]. They would be able to provide the patient with the twin tanks, however her account is on hold. Patient owes over $1000 and [facility] will not provide anything until she has at least paid the balance and made payment arrangements.   Left message for patient to call back.

## 2019-07-05 NOTE — Telephone Encounter (Signed)
Called and spoke with Patient.  Patient stated she needed a refill of her Prednisone sent to [facility] Prednisone refill request sent to pharmacy.  Nothing further at this time.  Per Kenney Houseman, NP  04/07/19   COPD: Continue Bevespi 2 puffs twice daily - sample given Continue Continue Proventil Continue prednisone 10 mg daily

## 2019-07-05 NOTE — Telephone Encounter (Signed)
Pt is returning your call

## 2019-07-05 NOTE — Telephone Encounter (Signed)
Spoke with patient and advised her of [employer]'s response. She stated that she will call [employer] to get this straighten out. I advised her to call us back once she gets this straightened out so that we can let RA know that [facility] does have the twin tanks and we can order them for her.   She verbalized understanding. Will await call back from patient.

## 2019-07-05 NOTE — Telephone Encounter (Signed)
Pt returning call

## 2019-07-06 ENCOUNTER — Telehealth: Payer: Self-pay | Admitting: Adult Health

## 2019-07-06 NOTE — Telephone Encounter (Signed)
Called spoke with patient - appt scheduled with Dr Elsworth Soho for 10.22.2020 @ 1415.  Nothing further needed; will sign off.

## 2019-07-06 NOTE — Telephone Encounter (Signed)
Patient called and is out of her prednisone and needs refill.  She is on chronic prednisone 10mg  daily . Says her prednisone was increased 20mg  recently and she is out . Needs rx that is for 20mg  daily or her medicaid insurance will not cover her prednisone . Says she can not afford it if her insurance does not pay.   Patient needs to come in for office visit , was last seen 04/07/2019 . Need clarification of her meds and discuss her prednisone dosage.   Sent rx for prednisone 10mg  1-2 daily as directed. No refills .   Please schedule office visit with Dr. Elsworth Soho  Or APP for follow up office visit.

## 2019-07-06 NOTE — Telephone Encounter (Signed)
ATC Patient.  LM to call back once she has spoken with [facility].

## 2019-07-07 NOTE — Telephone Encounter (Signed)
LMTCB x2 for pt 

## 2019-07-09 ENCOUNTER — Emergency Department (HOSPITAL_COMMUNITY): Payer: Medicaid Other

## 2019-07-09 ENCOUNTER — Emergency Department (HOSPITAL_COMMUNITY)
Admission: EM | Admit: 2019-07-09 | Discharge: 2019-07-09 | Disposition: A | Discharge status: Home / Self Care | Payer: Medicaid Other | Attending: Emergency Medicine | Admitting: Emergency Medicine

## 2019-07-09 DIAGNOSIS — J449 Chronic obstructive pulmonary disease, unspecified: Secondary | ICD-10-CM | POA: Diagnosis not present

## 2019-07-09 DIAGNOSIS — M79642 Pain in left hand: Secondary | ICD-10-CM

## 2019-07-09 DIAGNOSIS — G40909 Epilepsy, unspecified, not intractable, without status epilepticus: Secondary | ICD-10-CM | POA: Insufficient documentation

## 2019-07-09 DIAGNOSIS — I110 Hypertensive heart disease with heart failure: Secondary | ICD-10-CM | POA: Diagnosis not present

## 2019-07-09 DIAGNOSIS — E039 Hypothyroidism, unspecified: Secondary | ICD-10-CM | POA: Insufficient documentation

## 2019-07-09 DIAGNOSIS — Z79899 Other long term (current) drug therapy: Secondary | ICD-10-CM | POA: Insufficient documentation

## 2019-07-09 DIAGNOSIS — M545 Low back pain, unspecified: Secondary | ICD-10-CM

## 2019-07-09 DIAGNOSIS — Z881 Allergy status to other antibiotic agents status: Secondary | ICD-10-CM | POA: Insufficient documentation

## 2019-07-09 DIAGNOSIS — Z853 Personal history of malignant neoplasm of breast: Secondary | ICD-10-CM | POA: Insufficient documentation

## 2019-07-09 DIAGNOSIS — F17210 Nicotine dependence, cigarettes, uncomplicated: Secondary | ICD-10-CM | POA: Diagnosis not present

## 2019-07-09 DIAGNOSIS — I509 Heart failure, unspecified: Secondary | ICD-10-CM | POA: Diagnosis not present

## 2019-07-09 MED ORDER — KETOROLAC TROMETHAMINE 30 MG/ML IJ SOLN
30.0000 mg | Freq: Once | INTRAMUSCULAR | Status: AC
  Administered 2019-07-09: 30 mg via INTRAMUSCULAR
  Filled 2019-07-09: qty 1

## 2019-07-09 MED ORDER — METHOCARBAMOL 500 MG PO TABS
500.0000 mg | ORAL_TABLET | Freq: Once | ORAL | Status: AC
  Administered 2019-07-09: 12:00:00 500 mg via ORAL
  Filled 2019-07-09: qty 1

## 2019-07-09 MED ORDER — OXYCODONE-ACETAMINOPHEN 5-325 MG PO TABS
1.0000 | ORAL_TABLET | Freq: Once | ORAL | Status: AC
  Administered 2019-07-09: 1 via ORAL
  Filled 2019-07-09: qty 1

## 2019-07-09 MED ORDER — METHOCARBAMOL 500 MG PO TABS: 500.0000 mg | ORAL_TABLET | Freq: Two times a day (BID) | ORAL | 0 refills | Status: DC

## 2019-07-09 MED ORDER — OXYCODONE-ACETAMINOPHEN 5-325 MG PO TABS: 1.0000 | ORAL_TABLET | Freq: Four times a day (QID) | ORAL | 0 refills | Status: DC | PRN

## 2019-07-09 MED ORDER — LIDOCAINE 5 % EX PTCH
1.0000 | MEDICATED_PATCH | CUTANEOUS | Status: DC
  Administered 2019-07-09: 1 via TRANSDERMAL
  Filled 2019-07-09: qty 1

## 2019-07-09 NOTE — Discharge Instructions (Addendum)
Back Pain:  Your back pain should be treated with medicines such as ibuprofen or aleve and this back pain should improve over the next 2 weeks.  However if you develop severe or worsening pain, low back pain with fever, numbness, weakness, or inability to walk or urinate, you should return to the ER immediately. You will need to follow up with your primary healthcare provider in 1-2 weeks for reassessment.  Low back pain is discomfort in the lower back that may be due to injuries to muscles and ligaments around the spine. Occasionally, it may be caused by a problem to a part of the spine called a disc.  The pain may last several days or a week, however most patients' pain is completely resolved in 4 weeks.  Self-care:  The application of heat can help soothe the pain.  Maintaining your daily activities, including walking, is encourged, as it will help you get better faster than staying in bed.  Medications are also useful to help with pain control.  A commonly prescribed medications includes acetaminophen.  This medication is generally safe, though you should not take more than 8 of the extra strength (500mg ) pills a day.  Non steroidal anti inflammatory medications including Ibuprofen and naproxen;  These medications help both pain and swelling and are very useful in treating back pain.  They should be taken with food, as they can cause stomach upset, and more seriously, stomach bleeding.    Muscle relaxants:  These medications can help with muscle tightness that is a cause of lower back pain.  Most of these medications can cause drowsiness, and it is not safe to drive or use dangerous machinery while taking them.  Be aware that if you develop new symptoms, such as a fever, leg weakness, difficulty with or loss of control of your urine or bowels, abdominal pain, or more severe pain, you will need to seek medical attention and  / or return to the Emergency department.  If you do not have a doctor see  the list below.  I have prescribed you Percocet for pain and a muscle relaxer. Please take medication as prescribed. Please follow-up with your PCP for continued pain management.

## 2019-07-09 NOTE — ED Notes (Signed)
Canceled [facility] per RN
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
## 2019-07-09 NOTE — Telephone Encounter (Signed)
ATC pt, received a busy signal x2. °Will try back. °
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
## 2019-07-09 NOTE — ED Notes (Signed)
Patient transported to X-ray 

## 2019-07-09 NOTE — ED Notes (Signed)
Called pt for room no answer X5

## 2019-07-09 NOTE — ED Notes (Signed)
CALLED [facility] FOR PT TRANSPORT  

## 2019-07-12 ENCOUNTER — Other Ambulatory Visit: Payer: Medicaid Other

## 2019-07-12 NOTE — Telephone Encounter (Signed)
LMTCB

## 2019-07-13 NOTE — Telephone Encounter (Signed)
lmtcb for pt. [place], states that they have not been in contact with patient to discuss her balance, so she still would be ineligible to receive any new equipment from their office until a payment plan is made.

## 2019-07-14 NOTE — Telephone Encounter (Signed)
LMTCB x2 for pt 

## 2019-07-15 NOTE — Telephone Encounter (Signed)
Spoke with pt. States that she has been in touch with [employer] and they are currently working on this matter. Nothing further was needed right now.
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
## 2019-07-23 ENCOUNTER — Other Ambulatory Visit: Payer: Self-pay

## 2019-07-23 ENCOUNTER — Inpatient Hospital Stay: Payer: Medicaid Other | Attending: Hematology

## 2019-07-23 ENCOUNTER — Encounter: Payer: Self-pay | Admitting: Hematology

## 2019-07-23 ENCOUNTER — Inpatient Hospital Stay (HOSPITAL_BASED_OUTPATIENT_CLINIC_OR_DEPARTMENT_OTHER): Payer: Medicaid Other | Admitting: Hematology

## 2019-07-23 VITALS — BP 123/80 | HR 96 | Temp 98.7°F | Resp 18 | Ht 70.0 in | Wt 145.2 lb

## 2019-07-23 DIAGNOSIS — C50411 Malignant neoplasm of upper-outer quadrant of right female breast: Secondary | ICD-10-CM | POA: Insufficient documentation

## 2019-07-23 DIAGNOSIS — Z170 Estrogen receptor positive status [ER+]: Secondary | ICD-10-CM

## 2019-07-23 DIAGNOSIS — F319 Bipolar disorder, unspecified: Secondary | ICD-10-CM | POA: Insufficient documentation

## 2019-07-23 DIAGNOSIS — F4310 Post-traumatic stress disorder, unspecified: Secondary | ICD-10-CM | POA: Insufficient documentation

## 2019-07-23 DIAGNOSIS — J449 Chronic obstructive pulmonary disease, unspecified: Secondary | ICD-10-CM | POA: Insufficient documentation

## 2019-07-23 DIAGNOSIS — Z9011 Acquired absence of right breast and nipple: Secondary | ICD-10-CM | POA: Insufficient documentation

## 2019-07-23 DIAGNOSIS — Z79899 Other long term (current) drug therapy: Secondary | ICD-10-CM | POA: Diagnosis not present

## 2019-07-23 DIAGNOSIS — Z79811 Long term (current) use of aromatase inhibitors: Secondary | ICD-10-CM | POA: Insufficient documentation

## 2019-07-23 DIAGNOSIS — I10 Essential (primary) hypertension: Secondary | ICD-10-CM | POA: Insufficient documentation

## 2019-07-23 DIAGNOSIS — F209 Schizophrenia, unspecified: Secondary | ICD-10-CM | POA: Diagnosis not present

## 2019-07-23 DIAGNOSIS — Z7951 Long term (current) use of inhaled steroids: Secondary | ICD-10-CM | POA: Insufficient documentation

## 2019-07-23 DIAGNOSIS — E039 Hypothyroidism, unspecified: Secondary | ICD-10-CM | POA: Insufficient documentation

## 2019-07-23 LAB — CBC WITH DIFFERENTIAL (CANCER CENTER ONLY)
Abs Immature Granulocytes: 0.05 10*3/uL (ref 0.00–0.07)
Basophils Absolute: 0.1 10*3/uL (ref 0.0–0.1)
Basophils Relative: 0 %
Eosinophils Absolute: 0 10*3/uL (ref 0.0–0.5)
Eosinophils Relative: 0 %
HCT: 44.1 % (ref 36.0–46.0)
Hemoglobin: 13.1 g/dL (ref 12.0–15.0)
Immature Granulocytes: 0 %
Lymphocytes Relative: 9 %
Lymphs Abs: 1.1 10*3/uL (ref 0.7–4.0)
MCH: 27.6 pg (ref 26.0–34.0)
MCHC: 29.7 g/dL — ABNORMAL LOW (ref 30.0–36.0)
MCV: 92.8 fL (ref 80.0–100.0)
Monocytes Absolute: 0.5 10*3/uL (ref 0.1–1.0)
Monocytes Relative: 4 %
Neutro Abs: 10 10*3/uL — ABNORMAL HIGH (ref 1.7–7.7)
Neutrophils Relative %: 87 %
Platelet Count: 262 10*3/uL (ref 150–400)
RBC: 4.75 MIL/uL (ref 3.87–5.11)
RDW: 14.9 % (ref 11.5–15.5)
WBC Count: 11.7 10*3/uL — ABNORMAL HIGH (ref 4.0–10.5)
nRBC: 0 % (ref 0.0–0.2)

## 2019-07-23 LAB — CMP (CANCER CENTER ONLY)
ALT: 11 U/L (ref 0–44)
AST: 12 U/L — ABNORMAL LOW (ref 15–41)
Albumin: 3.7 g/dL (ref 3.5–5.0)
Alkaline Phosphatase: 91 U/L (ref 38–126)
Anion gap: 10 (ref 5–15)
BUN: 10 mg/dL (ref 6–20)
CO2: 35 mmol/L — ABNORMAL HIGH (ref 22–32)
Calcium: 9.5 mg/dL (ref 8.9–10.3)
Chloride: 96 mmol/L — ABNORMAL LOW (ref 98–111)
Creatinine: 0.64 mg/dL (ref 0.44–1.00)
GFR, Est AFR Am: >60 mL/min (ref >60)
GFR, Estimated: >60 mL/min (ref >60)
Glucose, Bld: 130 mg/dL — ABNORMAL HIGH (ref 70–99)
Potassium: 4.8 mmol/L (ref 3.5–5.1)
Sodium: 141 mmol/L (ref 135–145)
Total Bilirubin: 0.4 mg/dL (ref 0.3–1.2)
Total Protein: 7.2 g/dL (ref 6.5–8.1)

## 2019-07-23 MED ORDER — ANASTROZOLE 1 MG PO TABS: 1.0000 mg | ORAL_TABLET | Freq: Every day | ORAL | 5 refills | Status: DC

## 2019-07-26 ENCOUNTER — Telehealth: Payer: Self-pay | Admitting: Hematology

## 2019-07-26 NOTE — Telephone Encounter (Signed)
Scheduled appt per 10/16 los. ° °Spoke with pt and she is aware of the appt date and time. °

## 2019-07-29 ENCOUNTER — Ambulatory Visit (INDEPENDENT_AMBULATORY_CARE_PROVIDER_SITE_OTHER): Payer: Medicaid Other | Admitting: Pulmonary Disease

## 2019-07-29 ENCOUNTER — Encounter: Payer: Self-pay | Admitting: Pulmonary Disease

## 2019-07-29 ENCOUNTER — Other Ambulatory Visit: Payer: Self-pay | Admitting: Hematology

## 2019-07-29 ENCOUNTER — Other Ambulatory Visit: Payer: Self-pay

## 2019-07-29 DIAGNOSIS — J9611 Chronic respiratory failure with hypoxia: Secondary | ICD-10-CM

## 2019-07-29 DIAGNOSIS — F17200 Nicotine dependence, unspecified, uncomplicated: Secondary | ICD-10-CM

## 2019-07-29 DIAGNOSIS — J449 Chronic obstructive pulmonary disease, unspecified: Secondary | ICD-10-CM

## 2019-07-29 DIAGNOSIS — C50411 Malignant neoplasm of upper-outer quadrant of right female breast: Secondary | ICD-10-CM

## 2019-07-29 NOTE — Assessment & Plan Note (Signed)
O2 saturation was 89% on room air She will continue on 2 L oxygen at all times

## 2019-07-29 NOTE — Patient Instructions (Addendum)
Use 2 L oxygen at all times Congratulations on being drug-free and decreasing cigarette use Prescription for nicotine patches 14 x 1 month  Trial of Trelegy instead of Bevespi We will send in prescription for 1 month with 3 refills  Ask your PCP about anxiety medications and possible referral to psychiatry

## 2019-07-29 NOTE — Assessment & Plan Note (Signed)
A lot of her dyspnea seems to be related to anxiety rather than COPD today Trial of Trelegy instead of Bevespi We will send in prescription for 1 month with 3 refills  Ask your PCP about anxiety medications and possible referral to psychiatry
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
## 2019-07-29 NOTE — Assessment & Plan Note (Signed)
Smoking cessation was again emphasized is the most important intervention Congratulations on being drug-free and decreasing cigarette use Prescription for nicotine patches 14 x 1 month

## 2019-07-30 ENCOUNTER — Telehealth: Payer: Self-pay | Admitting: Pulmonary Disease

## 2019-07-30 MED ORDER — NICOTINE 14 MG/24HR TD PT24: 14.0000 mg | MEDICATED_PATCH | Freq: Every day | TRANSDERMAL | 0 refills | Status: DC

## 2019-07-30 NOTE — Telephone Encounter (Signed)
Called and spoke to patient. Let her know that I can see where staff has electronically sent the nicotine patch order to her pharmacy.   Attempted to call [facility] pharmacy to confirm that they have received Rx, was on hold for greater than 5 minutes.

## 2019-07-30 NOTE — Addendum Note (Signed)
Addended by: Jannette Spanner on: 07/30/2019 11:37 AM   Modules accepted: Orders

## 2019-08-02 NOTE — Telephone Encounter (Signed)
Called [facility], they have the pt's prescription for Nicotine patches ready to be picked up. Pt is aware. Nothing further was needed.

## 2019-08-12 ENCOUNTER — Emergency Department (HOSPITAL_COMMUNITY): Payer: Medicaid Other

## 2019-08-12 ENCOUNTER — Inpatient Hospital Stay (HOSPITAL_COMMUNITY)
Admission: EM | Admit: 2019-08-12 | Discharge: 2019-08-20 | DRG: 190 | Disposition: A | Discharge status: Home / Self Care | Payer: Medicaid Other | Attending: Internal Medicine | Admitting: Internal Medicine

## 2019-08-12 ENCOUNTER — Other Ambulatory Visit: Payer: Self-pay

## 2019-08-12 DIAGNOSIS — I10 Essential (primary) hypertension: Secondary | ICD-10-CM | POA: Diagnosis present

## 2019-08-12 DIAGNOSIS — Z716 Tobacco abuse counseling: Secondary | ICD-10-CM

## 2019-08-12 DIAGNOSIS — Z7951 Long term (current) use of inhaled steroids: Secondary | ICD-10-CM

## 2019-08-12 DIAGNOSIS — E872 Acidosis: Secondary | ICD-10-CM | POA: Diagnosis present

## 2019-08-12 DIAGNOSIS — Z8249 Family history of ischemic heart disease and other diseases of the circulatory system: Secondary | ICD-10-CM | POA: Diagnosis not present

## 2019-08-12 DIAGNOSIS — Z66 Do not resuscitate: Secondary | ICD-10-CM | POA: Diagnosis present

## 2019-08-12 DIAGNOSIS — Z7952 Long term (current) use of systemic steroids: Secondary | ICD-10-CM | POA: Diagnosis not present

## 2019-08-12 DIAGNOSIS — R0600 Dyspnea, unspecified: Secondary | ICD-10-CM

## 2019-08-12 DIAGNOSIS — F4310 Post-traumatic stress disorder, unspecified: Secondary | ICD-10-CM | POA: Diagnosis present

## 2019-08-12 DIAGNOSIS — Z79899 Other long term (current) drug therapy: Secondary | ICD-10-CM

## 2019-08-12 DIAGNOSIS — E039 Hypothyroidism, unspecified: Secondary | ICD-10-CM | POA: Diagnosis present

## 2019-08-12 DIAGNOSIS — Z9981 Dependence on supplemental oxygen: Secondary | ICD-10-CM | POA: Diagnosis not present

## 2019-08-12 DIAGNOSIS — J9601 Acute respiratory failure with hypoxia: Secondary | ICD-10-CM | POA: Diagnosis not present

## 2019-08-12 DIAGNOSIS — J441 Chronic obstructive pulmonary disease with (acute) exacerbation: Principal | ICD-10-CM | POA: Diagnosis present

## 2019-08-12 DIAGNOSIS — G40909 Epilepsy, unspecified, not intractable, without status epilepticus: Secondary | ICD-10-CM | POA: Diagnosis present

## 2019-08-12 DIAGNOSIS — J9622 Acute and chronic respiratory failure with hypercapnia: Secondary | ICD-10-CM | POA: Diagnosis present

## 2019-08-12 DIAGNOSIS — Z79890 Hormone replacement therapy (postmenopausal): Secondary | ICD-10-CM

## 2019-08-12 DIAGNOSIS — F17210 Nicotine dependence, cigarettes, uncomplicated: Secondary | ICD-10-CM | POA: Diagnosis present

## 2019-08-12 DIAGNOSIS — Z7189 Other specified counseling: Secondary | ICD-10-CM | POA: Diagnosis not present

## 2019-08-12 DIAGNOSIS — Z853 Personal history of malignant neoplasm of breast: Secondary | ICD-10-CM | POA: Diagnosis not present

## 2019-08-12 DIAGNOSIS — R269 Unspecified abnormalities of gait and mobility: Secondary | ICD-10-CM | POA: Diagnosis present

## 2019-08-12 DIAGNOSIS — Z20828 Contact with and (suspected) exposure to other viral communicable diseases: Secondary | ICD-10-CM | POA: Diagnosis present

## 2019-08-12 DIAGNOSIS — J9621 Acute and chronic respiratory failure with hypoxia: Secondary | ICD-10-CM

## 2019-08-12 DIAGNOSIS — Z720 Tobacco use: Secondary | ICD-10-CM

## 2019-08-12 DIAGNOSIS — F1490 Cocaine use, unspecified, uncomplicated: Secondary | ICD-10-CM | POA: Diagnosis present

## 2019-08-12 DIAGNOSIS — F3170 Bipolar disorder, currently in remission, most recent episode unspecified: Secondary | ICD-10-CM | POA: Diagnosis present

## 2019-08-12 DIAGNOSIS — R0689 Other abnormalities of breathing: Secondary | ICD-10-CM | POA: Diagnosis present

## 2019-08-12 DIAGNOSIS — F209 Schizophrenia, unspecified: Secondary | ICD-10-CM | POA: Diagnosis present

## 2019-08-12 DIAGNOSIS — Z881 Allergy status to other antibiotic agents status: Secondary | ICD-10-CM

## 2019-08-12 DIAGNOSIS — Z803 Family history of malignant neoplasm of breast: Secondary | ICD-10-CM

## 2019-08-12 DIAGNOSIS — D649 Anemia, unspecified: Secondary | ICD-10-CM | POA: Diagnosis present

## 2019-08-12 LAB — CBC
HCT: 45.8 % (ref 36.0–46.0)
Hemoglobin: 13.5 g/dL (ref 12.0–15.0)
MCH: 28.2 pg (ref 26.0–34.0)
MCHC: 29.5 g/dL — ABNORMAL LOW (ref 30.0–36.0)
MCV: 95.6 fL (ref 80.0–100.0)
Platelets: 284 10*3/uL (ref 150–400)
RBC: 4.79 MIL/uL (ref 3.87–5.11)
RDW: 14.3 % (ref 11.5–15.5)
WBC: 18.8 10*3/uL — ABNORMAL HIGH (ref 4.0–10.5)
nRBC: 0 % (ref 0.0–0.2)

## 2019-08-12 LAB — BASIC METABOLIC PANEL
Anion gap: 12 (ref 5–15)
BUN: 10 mg/dL (ref 6–20)
CO2: 31 mmol/L (ref 22–32)
Calcium: 9.3 mg/dL (ref 8.9–10.3)
Chloride: 97 mmol/L — ABNORMAL LOW (ref 98–111)
Creatinine, Ser: 0.48 mg/dL (ref 0.44–1.00)
GFR calc Af Amer: >60 mL/min (ref >60)
GFR calc non Af Amer: >60 mL/min (ref >60)
Glucose, Bld: 134 mg/dL — ABNORMAL HIGH (ref 70–99)
Potassium: 4.5 mmol/L (ref 3.5–5.1)
Sodium: 140 mmol/L (ref 135–145)

## 2019-08-12 LAB — BRAIN NATRIURETIC PEPTIDE: B Natriuretic Peptide: 28.3 pg/mL (ref 0.0–100.0)

## 2019-08-12 MED ORDER — ONDANSETRON HCL 4 MG/2ML IJ SOLN
4.0000 mg | Freq: Once | INTRAMUSCULAR | Status: AC
  Administered 2019-08-12: 22:00:00 4 mg via INTRAVENOUS
  Filled 2019-08-12: qty 2

## 2019-08-12 MED ORDER — METHYLPREDNISOLONE SODIUM SUCC 125 MG IJ SOLR
125.0000 mg | Freq: Once | INTRAMUSCULAR | Status: AC
  Administered 2019-08-12: 125 mg via INTRAVENOUS
  Filled 2019-08-12: qty 2

## 2019-08-12 MED ORDER — IPRATROPIUM BROMIDE 0.02 % IN SOLN
0.5000 mg | Freq: Once | RESPIRATORY_TRACT | Status: DC
  Filled 2019-08-12: qty 2.5

## 2019-08-12 MED ORDER — MAGNESIUM SULFATE 2 GM/50ML IV SOLN
2.0000 g | Freq: Once | INTRAVENOUS | Status: AC
  Administered 2019-08-12: 2 g via INTRAVENOUS
  Filled 2019-08-12: qty 50

## 2019-08-12 MED ORDER — ALBUTEROL SULFATE (2.5 MG/3ML) 0.083% IN NEBU: 5.0000 mg | INHALATION_SOLUTION | RESPIRATORY_TRACT | Status: DC | PRN
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
## 2019-08-12 NOTE — ED Triage Notes (Signed)
Received with SOB and midsternal ches pain. Verbalized history of COPD and CHF, currently on 3L O2 which she gets at home also. O2 saturation in upper 90's.
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
## 2019-08-13 DIAGNOSIS — J441 Chronic obstructive pulmonary disease with (acute) exacerbation: Secondary | ICD-10-CM | POA: Diagnosis present

## 2019-08-13 DIAGNOSIS — Z7189 Other specified counseling: Secondary | ICD-10-CM

## 2019-08-13 DIAGNOSIS — Z720 Tobacco use: Secondary | ICD-10-CM

## 2019-08-13 DIAGNOSIS — J9621 Acute and chronic respiratory failure with hypoxia: Secondary | ICD-10-CM

## 2019-08-13 DIAGNOSIS — J9622 Acute and chronic respiratory failure with hypercapnia: Secondary | ICD-10-CM

## 2019-08-13 LAB — CBC
HCT: 45.2 % (ref 36.0–46.0)
Hemoglobin: 12.9 g/dL (ref 12.0–15.0)
MCH: 27.7 pg (ref 26.0–34.0)
MCHC: 28.5 g/dL — ABNORMAL LOW (ref 30.0–36.0)
MCV: 97.2 fL (ref 80.0–100.0)
Platelets: 282 10*3/uL (ref 150–400)
RBC: 4.65 MIL/uL (ref 3.87–5.11)
RDW: 14.4 % (ref 11.5–15.5)
WBC: 17 10*3/uL — ABNORMAL HIGH (ref 4.0–10.5)
nRBC: 0 % (ref 0.0–0.2)

## 2019-08-13 LAB — BLOOD GAS, ARTERIAL
Acid-Base Excess: 10.6 mmol/L — ABNORMAL HIGH (ref 0.0–2.0)
Acid-Base Excess: 10.6 mmol/L — ABNORMAL HIGH (ref 0.0–2.0)
Acid-Base Excess: 9.5 mmol/L — ABNORMAL HIGH (ref 0.0–2.0)
Bicarbonate: 37.6 mmol/L — ABNORMAL HIGH (ref 20.0–28.0)
Bicarbonate: 38.2 mmol/L — ABNORMAL HIGH (ref 20.0–28.0)
Bicarbonate: 38.6 mmol/L — ABNORMAL HIGH (ref 20.0–28.0)
Delivery systems: POSITIVE
Delivery systems: POSITIVE
Drawn by: 560371
Expiratory PAP: 6
Expiratory PAP: 6
FIO2: 40
FIO2: 40
Inspiratory PAP: 12
Inspiratory PAP: 18
O2 Content: 3 L/min
O2 Saturation: 92.7 %
O2 Saturation: 96.8 %
O2 Saturation: 97 %
Patient temperature: 37
Patient temperature: 37
Patient temperature: 37
RATE: 12 resp/min
pCO2 arterial: 102 mmHg (ref 32.0–48.0)
pCO2 arterial: 103 mmHg (ref 32.0–48.0)
pCO2 arterial: 94.8 mmHg (ref 32.0–48.0)
pH, Arterial: 7.19 — CL (ref 7.350–7.450)
pH, Arterial: 7.199 — CL (ref 7.350–7.450)
pH, Arterial: 7.228 — ABNORMAL LOW (ref 7.350–7.450)
pO2, Arterial: 103 mmHg (ref 83.0–108.0)
pO2, Arterial: 108 mmHg (ref 83.0–108.0)
pO2, Arterial: 71.9 mmHg — ABNORMAL LOW (ref 83.0–108.0)

## 2019-08-13 LAB — HIV ANTIBODY (ROUTINE TESTING W REFLEX): HIV Screen 4th Generation wRfx: NONREACTIVE

## 2019-08-13 LAB — SARS CORONAVIRUS 2 (TAT 6-24 HRS): SARS Coronavirus 2: NEGATIVE

## 2019-08-13 MED ORDER — LEVOTHYROXINE SODIUM 75 MCG PO TABS
75.0000 ug | ORAL_TABLET | Freq: Every day | ORAL | Status: DC
  Administered 2019-08-13 – 2019-08-20 (×8): 75 ug via ORAL
  Filled 2019-08-13 (×8): qty 1

## 2019-08-13 MED ORDER — DIVALPROEX SODIUM ER 500 MG PO TB24
500.0000 mg | ORAL_TABLET | Freq: Every day | ORAL | Status: DC
  Administered 2019-08-13 – 2019-08-20 (×5): 500 mg via ORAL
  Filled 2019-08-13 (×9): qty 1

## 2019-08-13 MED ORDER — CLONAZEPAM 0.5 MG PO TABS
0.5000 mg | ORAL_TABLET | Freq: Three times a day (TID) | ORAL | Status: DC | PRN
  Filled 2019-08-13: qty 1

## 2019-08-13 MED ORDER — SODIUM CHLORIDE 0.9 % IV SOLN
500.0000 mg | INTRAVENOUS | Status: DC
  Administered 2019-08-13 – 2019-08-14 (×2): 500 mg via INTRAVENOUS
  Filled 2019-08-13 (×2): qty 500

## 2019-08-13 MED ORDER — NICOTINE 14 MG/24HR TD PT24
14.0000 mg | MEDICATED_PATCH | Freq: Every day | TRANSDERMAL | Status: DC
  Administered 2019-08-13 – 2019-08-20 (×8): 14 mg via TRANSDERMAL
  Filled 2019-08-13 (×8): qty 1

## 2019-08-13 MED ORDER — METHYLPREDNISOLONE SODIUM SUCC 125 MG IJ SOLR
60.0000 mg | Freq: Four times a day (QID) | INTRAMUSCULAR | Status: DC
  Administered 2019-08-13 – 2019-08-14 (×5): 60 mg via INTRAVENOUS
  Filled 2019-08-13 (×5): qty 2

## 2019-08-13 MED ORDER — ONDANSETRON HCL 4 MG/2ML IJ SOLN
4.0000 mg | Freq: Once | INTRAMUSCULAR | Status: AC
  Administered 2019-08-13: 03:00:00 4 mg via INTRAVENOUS
  Filled 2019-08-13: qty 2

## 2019-08-13 MED ORDER — PANTOPRAZOLE SODIUM 40 MG PO TBEC
40.0000 mg | DELAYED_RELEASE_TABLET | Freq: Every day | ORAL | Status: DC
  Administered 2019-08-13 – 2019-08-20 (×8): 40 mg via ORAL
  Filled 2019-08-13 (×8): qty 1

## 2019-08-13 MED ORDER — BUDESONIDE 0.5 MG/2ML IN SUSP
2.0000 mg | Freq: Two times a day (BID) | RESPIRATORY_TRACT | Status: DC
  Administered 2019-08-13 – 2019-08-14 (×2): 2 mg via RESPIRATORY_TRACT
  Administered 2019-08-14: 0.5 mg via RESPIRATORY_TRACT
  Administered 2019-08-15: 2 mg via RESPIRATORY_TRACT
  Administered 2019-08-15 – 2019-08-16 (×2): 0.5 mg via RESPIRATORY_TRACT
  Administered 2019-08-16: 2 mg via RESPIRATORY_TRACT
  Administered 2019-08-17: 0.5 mg via RESPIRATORY_TRACT
  Administered 2019-08-17: 2 mg via RESPIRATORY_TRACT
  Administered 2019-08-18: 20:00:00 0.5 mg via RESPIRATORY_TRACT
  Administered 2019-08-18 – 2019-08-20 (×4): 2 mg via RESPIRATORY_TRACT
  Filled 2019-08-13 (×18): qty 8

## 2019-08-13 MED ORDER — FOLIC ACID 1 MG PO TABS
1.0000 mg | ORAL_TABLET | Freq: Every day | ORAL | Status: DC
  Administered 2019-08-13 – 2019-08-20 (×8): 1 mg via ORAL
  Filled 2019-08-13 (×8): qty 1

## 2019-08-13 MED ORDER — FLUDROCORTISONE ACETATE 0.1 MG PO TABS: 0.1000 mg | ORAL_TABLET | Freq: Every day | ORAL | Status: DC

## 2019-08-13 MED ORDER — ALBUTEROL SULFATE (2.5 MG/3ML) 0.083% IN NEBU
2.5000 mg | INHALATION_SOLUTION | RESPIRATORY_TRACT | Status: DC | PRN
  Administered 2019-08-15 – 2019-08-18 (×2): 2.5 mg via RESPIRATORY_TRACT
  Filled 2019-08-13 (×2): qty 3

## 2019-08-13 MED ORDER — FLUOXETINE HCL 20 MG PO CAPS
40.0000 mg | ORAL_CAPSULE | Freq: Every day | ORAL | Status: DC
  Administered 2019-08-13 – 2019-08-14 (×2): 40 mg via ORAL
  Filled 2019-08-13 (×4): qty 2

## 2019-08-13 MED ORDER — ENOXAPARIN SODIUM 40 MG/0.4ML ~~LOC~~ SOLN
40.0000 mg | Freq: Every day | SUBCUTANEOUS | Status: DC
  Administered 2019-08-13 – 2019-08-20 (×8): 40 mg via SUBCUTANEOUS
  Filled 2019-08-13 (×8): qty 0.4

## 2019-08-13 MED ORDER — IPRATROPIUM-ALBUTEROL 0.5-2.5 (3) MG/3ML IN SOLN
3.0000 mL | Freq: Four times a day (QID) | RESPIRATORY_TRACT | Status: DC
  Administered 2019-08-13 – 2019-08-16 (×12): 3 mL via RESPIRATORY_TRACT
  Filled 2019-08-13 (×14): qty 3

## 2019-08-13 MED ORDER — SODIUM CHLORIDE 0.9 % IV SOLN
1.0000 g | Freq: Every day | INTRAVENOUS | Status: DC
  Administered 2019-08-13 (×2): 1 g via INTRAVENOUS
  Filled 2019-08-13 (×2): qty 10

## 2019-08-13 MED ORDER — ANASTROZOLE 1 MG PO TABS
1.0000 mg | ORAL_TABLET | Freq: Every day | ORAL | Status: DC
  Administered 2019-08-13 – 2019-08-19 (×7): 1 mg via ORAL
  Filled 2019-08-13 (×9): qty 1

## 2019-08-13 MED ORDER — CLONAZEPAM 0.5 MG PO TBDP: 0.5000 mg | ORAL_TABLET | Freq: Three times a day (TID) | ORAL | Status: DC | PRN

## 2019-08-13 MED ORDER — HYDRALAZINE HCL 20 MG/ML IJ SOLN
5.0000 mg | INTRAMUSCULAR | Status: DC | PRN
  Administered 2019-08-15: 5 mg via INTRAVENOUS
  Filled 2019-08-13: qty 1

## 2019-08-13 MED ORDER — BUDESONIDE 0.5 MG/2ML IN SUSP
2.0000 mg | Freq: Four times a day (QID) | RESPIRATORY_TRACT | Status: DC
  Filled 2019-08-13 (×2): qty 8
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
## 2019-08-13 NOTE — Progress Notes (Signed)
  PROGRESS NOTE  Patient admitted earlier this morning. See H&P. Courtney Terry is a 58 y.o. female with medical history significant of asthma, COPD and chronic hypoxic respiratory failure on 3L home oxygen, hypertension, hypothyroidism, depression, anxiety, bipolar disorder, seizures, tobacco use presenting with complaints of dyspnea. Patient has had a productive cough, shortness of breath, and wheezing for the past 1 week.  She is coughing up a lot of mucus.  Last night around 7 PM she became acutely worse and could hardly breathe. On arrival to the ED, patient was tachycardic, tachypneic, wheezing, and had significantly increased work of breathing.  Oxygen saturation 97% on 3 L home oxygen.  Initial ABG showing pH 7.19, PCO2 102, and PO2 108.  Patient was placed on BiPAP and her work of breathing improved.  She was given albuterol, Atrovent, magnesium 2 g, and Solu-Medrol 125 mg.  Afebrile.  White blood cell count 18.8.  BNP normal.  Chest x-ray with findings consistent with COPD and no acute cardiopulmonary disease.  Patient was given a short break off BiPAP this morning per patient's request. On evaluation, she had conversational dyspnea, with decreased breath sounds.   Acute on chronic hypoxemic hypercapnic respiratory failure secondary to COPD exacerbation -COVID 19 negative -Respiratory viral panel pending  -BiPAP and wean to home 3L [place] O2 as able -PCCM consulted -Solumedrol (on 10mg  prednisone at baseline), nebs, ceftriaxone/azithromycin -After discussion with PCCM, patient declined intubation in setting of decompensation. Now DNR.   Tobacco abuse -Cessation counseling -Nicotine patch  Hypothyroidism -Synthroid  Anxiety -Klonopin, Prozac   Seizure disorder -Depakote    Dessa Phi, DO [facility] 08/13/2019, 12:09 PM  Available via Epic secure chat 7am-7pm After these hours, please refer to coverage provider listed on [email or web address]

## 2019-08-13 NOTE — ED Notes (Signed)
PT DO provider said not to ambulate with pulse ox at this time.

## 2019-08-13 NOTE — Progress Notes (Signed)
Pt placed on BIPAP per pt's request for sleep, and immediately started asking for ice cream. Pt wanted to be taken back off BIPAP so she could eat, and for RT to come back in 15 minutes to be placed back on BIPAP. RT will check back

## 2019-08-13 NOTE — Plan of Care (Signed)
°  Problem: Education: °Goal: Knowledge of disease or condition will improve °Outcome: Progressing °Goal: Knowledge of the prescribed therapeutic regimen will improve °Outcome: Progressing °Goal: Individualized Educational Video(s) °Outcome: Progressing °  °

## 2019-08-13 NOTE — ED Notes (Signed)
Called SR (at this time)  for pt breakfast tray they said its ready but he didn't know why is hasn't made it upstairs. He said she was going to call the kitchen to find out why it hasn't made it to the pt room.

## 2019-08-13 NOTE — ED Notes (Signed)
Pt placed back on bipap

## 2019-08-13 NOTE — ED Notes (Signed)
SDU  Paged Dr Marlowe Sax to [place]
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
## 2019-08-13 NOTE — Progress Notes (Signed)
ABG drawn at 0330, abg would not cross over Istat results are PH- 7.19, pco2>97, HC03> reportable rang, p02- 84

## 2019-08-13 NOTE — ED Notes (Signed)
PT family member given water. Informed PT is to not have any.

## 2019-08-13 NOTE — ED Notes (Signed)
Pt taken off BIPAP per Dr. Maylene Roes request. Pt given breathing treatment per RT. Pt placed on 4 liters [place]. Diet order placed and breakfast ordered.  Pulmonary MD at bedside.

## 2019-08-13 NOTE — ED Notes (Signed)
Spk with MD regarding taking pt off bipap; new orders rec'd

## 2019-08-14 DIAGNOSIS — J9601 Acute respiratory failure with hypoxia: Secondary | ICD-10-CM

## 2019-08-14 LAB — BASIC METABOLIC PANEL
Anion gap: 8 (ref 5–15)
BUN: 15 mg/dL (ref 6–20)
CO2: 36 mmol/L — ABNORMAL HIGH (ref 22–32)
Calcium: 9.2 mg/dL (ref 8.9–10.3)
Chloride: 95 mmol/L — ABNORMAL LOW (ref 98–111)
Creatinine, Ser: 0.43 mg/dL — ABNORMAL LOW (ref 0.44–1.00)
GFR calc Af Amer: >60 mL/min (ref >60)
GFR calc non Af Amer: >60 mL/min (ref >60)
Glucose, Bld: 123 mg/dL — ABNORMAL HIGH (ref 70–99)
Potassium: 5.1 mmol/L (ref 3.5–5.1)
Sodium: 139 mmol/L (ref 135–145)

## 2019-08-14 LAB — CBC
HCT: 39.7 % (ref 36.0–46.0)
Hemoglobin: 11.3 g/dL — ABNORMAL LOW (ref 12.0–15.0)
MCH: 27.5 pg (ref 26.0–34.0)
MCHC: 28.5 g/dL — ABNORMAL LOW (ref 30.0–36.0)
MCV: 96.6 fL (ref 80.0–100.0)
Platelets: 251 10*3/uL (ref 150–400)
RBC: 4.11 MIL/uL (ref 3.87–5.11)
RDW: 14.1 % (ref 11.5–15.5)
WBC: 13.8 10*3/uL — ABNORMAL HIGH (ref 4.0–10.5)
nRBC: 0 % (ref 0.0–0.2)

## 2019-08-14 LAB — GLUCOSE, CAPILLARY: Glucose-Capillary: 136 mg/dL — ABNORMAL HIGH (ref 70–99)

## 2019-08-14 MED ORDER — ENSURE ENLIVE PO LIQD
237.0000 mL | Freq: Two times a day (BID) | ORAL | Status: DC
  Administered 2019-08-14 – 2019-08-19 (×7): 237 mL via ORAL

## 2019-08-14 MED ORDER — TRAMADOL HCL 50 MG PO TABS
50.0000 mg | ORAL_TABLET | Freq: Four times a day (QID) | ORAL | Status: DC | PRN
  Administered 2019-08-15 – 2019-08-17 (×2): 50 mg via ORAL
  Filled 2019-08-14 (×2): qty 1

## 2019-08-14 MED ORDER — AZITHROMYCIN 250 MG PO TABS
250.0000 mg | ORAL_TABLET | Freq: Every day | ORAL | Status: AC
  Administered 2019-08-14 – 2019-08-17 (×4): 250 mg via ORAL
  Filled 2019-08-14 (×4): qty 1

## 2019-08-14 MED ORDER — ADULT MULTIVITAMIN W/MINERALS CH
1.0000 | ORAL_TABLET | Freq: Every day | ORAL | Status: DC
  Administered 2019-08-14 – 2019-08-20 (×7): 1 via ORAL
  Filled 2019-08-14 (×7): qty 1

## 2019-08-14 MED ORDER — CEFDINIR 300 MG PO CAPS
300.0000 mg | ORAL_CAPSULE | Freq: Two times a day (BID) | ORAL | Status: AC
  Administered 2019-08-14 – 2019-08-19 (×12): 300 mg via ORAL
  Filled 2019-08-14 (×12): qty 1

## 2019-08-14 MED ORDER — HYDROCOD POLST-CPM POLST ER 10-8 MG/5ML PO SUER
5.0000 mL | Freq: Two times a day (BID) | ORAL | Status: DC | PRN
  Administered 2019-08-14 – 2019-08-19 (×8): 5 mL via ORAL
  Filled 2019-08-14 (×9): qty 5

## 2019-08-14 MED ORDER — PREDNISONE 20 MG PO TABS
20.0000 mg | ORAL_TABLET | Freq: Two times a day (BID) | ORAL | Status: AC
  Administered 2019-08-14 – 2019-08-16 (×5): 20 mg via ORAL
  Filled 2019-08-14 (×5): qty 1
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
## 2019-08-14 NOTE — Progress Notes (Signed)
PROGRESS NOTE    Courtney Terry  [id number] DOB: August 05, 1961 DOA: 08/12/2019 PCP: [name], MD     Brief Narrative:  Courtney Terry a 58 y.o.femalewith medical history significant ofasthma, COPDand chronic hypoxic respiratory failure on 3L home oxygen, hypertension, hypothyroidism, depression, anxiety, bipolar disorder, seizures, tobacco use presenting with complaints of dyspnea.Patient has had a productive cough, shortness of breath, and wheezing for the past 1 week. She is coughing up a lot of mucus. Last night around 7 PM she became acutely worse and could hardly breathe. On arrival to the ED, patient was tachycardic, tachypneic, wheezing, and had significantly increased work of breathing. Oxygen saturation 97% on 3 L home oxygen. Initial ABG showing pH 7.19, PCO2 102, and PO2 108. Patient was placed on BiPAP and her work of breathing improved. She was given albuterol, Atrovent, magnesium 2 g, and Solu-Medrol 125 mg. Afebrile. White blood cell count 18.8. BNP normal. Chest x-ray with findings consistent with COPD and no acute cardiopulmonary disease.   New events last 24 hours / Subjective: Off BiPAP this morning. Breathing is about the same. Still coughing productive sputum with blood streaks.   Assessment & Plan:   Principal Problem:   COPD with acute exacerbation ([place]) Active Problems:   Hypertension   Tobacco use   Acute on chronic respiratory failure with hypoxia and hypercapnia (HCC)   COPD exacerbation (HCC)   Acute on chronic hypoxemic hypercapnic respiratory failure secondary to COPD exacerbation -COVID 19 negative -Respiratory viral panel ? Looks like it was canceled overnight ?  -BiPAP and wean to home 3L [place] O2 as able. Currently on 4L  -PCCM consulted -Solumedrol (on 10mg  prednisone at baseline), nebs, ceftriaxone/azithromycin -After discussion with PCCM, patient declined intubation in setting of decompensation. Now DNR.   Tobacco  abuse -Cessation counseling -Nicotine patch  Hypothyroidism -Synthroid  Anxiety -Klonopin, Prozac   Seizure disorder -Depakote   DVT prophylaxis: Lovenox Code Status: DNR Family Communication: None at bedside Disposition Plan: Pending clinical improvement   Consultants:   PCCM  Procedures:   None   Antimicrobials:  Anti-infectives (From admission, onward)   Start     Dose/Rate Route Frequency Ordered Stop   08/13/19 0900  azithromycin (ZITHROMAX) 500 mg in sodium chloride 0.9 % 250 mL IVPB     500 mg 250 mL/hr over 60 Minutes Intravenous Every 24 hours 08/13/19 0844     08/13/19 0130  cefTRIAXone (ROCEPHIN) 1 g in sodium chloride 0.9 % 100 mL IVPB     1 g 200 mL/hr over 30 Minutes Intravenous Daily at bedtime 08/13/19 0114 08/17/19 2159        Objective: Vitals:   08/14/19 0642 08/14/19 0728 08/14/19 0806 08/14/19 0809  BP: 139/77 109/81    Pulse: 81 88    Resp: 20 18    Temp: 98.7 F (37.1 C) 98.4 F (36.9 C)    TempSrc: Oral Oral    SpO2: 100% 100% 95% 100%  Weight: 67 kg     Height:        Intake/Output Summary (Last 24 hours) at 08/14/2019 1052 Last data filed at 08/14/2019 0619 Gross per 24 hour  Intake 1450 ml  Output 500 ml  Net 950 ml   Filed Weights   08/12/19 2048 08/13/19 1655 08/14/19 0642  Weight: 67 kg 66.4 kg 67 kg    Examination:  General exam: Appears calm and comfortable  Respiratory system: Decreased breath sounds diffusely, on 4 L nasal cannula O2 with tachypnea Cardiovascular  system: S1 & S2 heard, RRR. No murmurs. No pedal edema. Gastrointestinal system: Abdomen is nondistended, soft and nontender. Normal bowel sounds heard. Central nervous system: Alert and oriented. No focal neurological deficits. Speech clear.  Extremities: Symmetric in appearance  Skin: No rashes, lesions or ulcers on exposed skin  Psychiatry: Judgement and insight appear normal. Mood & affect appropriate.   Data Reviewed: I have personally  reviewed following labs and imaging studies  CBC: Recent Labs  Lab 08/12/19 2106 08/13/19 0500 08/14/19 0404  WBC 18.8* 17.0* 13.8*  HGB 13.5 12.9 11.3*  HCT 45.8 45.2 39.7  MCV 95.6 97.2 96.6  PLT 284 282 [id number]   Basic Metabolic Panel: Recent Labs  Lab 08/12/19 2106 08/14/19 0404  NA 140 139  K 4.5 5.1  CL 97* 95*  CO2 31 36*  GLUCOSE 134* 123*  BUN 10 15  CREATININE 0.48 0.43*  CALCIUM 9.3 9.2   GFR: Estimated Creatinine Clearance: 81.1 mL/min (A) (by C-G formula based on SCr of 0.43 mg/dL (L)). Liver Function Tests: No results for input(s): AST, ALT, ALKPHOS, BILITOT, PROT, ALBUMIN in the last 168 hours. No results for input(s): LIPASE, AMYLASE in the last 168 hours. No results for input(s): AMMONIA in the last 168 hours. Coagulation Profile: No results for input(s): INR, PROTIME in the last 168 hours. Cardiac Enzymes: No results for input(s): CKTOTAL, CKMB, CKMBINDEX, TROPONINI in the last 168 hours. BNP (last 3 results) No results for input(s): PROBNP in the last 8760 hours. HbA1C: No results for input(s): HGBA1C in the last 72 hours. CBG: No results for input(s): GLUCAP in the last 168 hours. Lipid Profile: No results for input(s): CHOL, HDL, LDLCALC, TRIG, CHOLHDL, LDLDIRECT in the last 72 hours. Thyroid Function Tests: No results for input(s): TSH, T4TOTAL, FREET4, T3FREE, THYROIDAB in the last 72 hours. Anemia Panel: No results for input(s): VITAMINB12, FOLATE, FERRITIN, TIBC, IRON, RETICCTPCT in the last 72 hours. Sepsis Labs: No results for input(s): PROCALCITON, LATICACIDVEN in the last 168 hours.  Recent Results (from the past 240 hour(s))  SARS CORONAVIRUS 2 (TAT 6-24 HRS) Nasopharyngeal Nasopharyngeal Swab     Status: None   Collection Time: 08/12/19  9:25 PM   Specimen: Nasopharyngeal Swab  Result Value Ref Range Status   SARS Coronavirus 2 NEGATIVE NEGATIVE Final    Comment: (NOTE) SARS-CoV-2 target nucleic acids are NOT DETECTED. The  SARS-CoV-2 RNA is generally detectable in upper and lower respiratory specimens during the acute phase of infection. Negative results do not preclude SARS-CoV-2 infection, do not rule out co-infections with other pathogens, and should not be used as the sole basis for treatment or other patient management decisions. Negative results must be combined with clinical observations, patient history, and epidemiological information. The expected result is Negative. Fact Sheet for Patients: SugarRoll.be Fact Sheet for Healthcare Providers: https://www.woods-mathews.com/ This test is not yet approved or cleared by the [place] FDA and  has been authorized for detection and/or diagnosis of SARS-CoV-2 by FDA under an Emergency Use Authorization (EUA). This EUA will remain  in effect (meaning this test can be used) for the duration of the COVID-19 declaration under Section 56 4(b)(1) of the Act, 21 U.S.C. section 360bbb-3(b)(1), unless the authorization is terminated or revoked sooner. Performed at [facility], [place] [address], [place], [place] [address]       Radiology Studies: Dg Chest [facility] 1 View  Result Date: 08/12/2019 CLINICAL DATA:  Shortness of breath, history of CHF and COPD EXAM: PORTABLE CHEST  1 VIEW COMPARISON:  Radiograph 12/03/2018, CT 11/08/2018 FINDINGS: Hyperexpansion of the lungs, similar to comparison exam with some mild chronic coarse interstitial changes. No focal opacity. No pneumothorax or effusion. Cardiomediastinal silhouette is similar to prior. Postsurgical changes are noted in the right breast and axilla. No acute osseous or soft tissue abnormality. IMPRESSION: 1. Findings consistent with COPD. No acute cardiopulmonary disease. Electronically Signed   By: Lovena Le M.D.   On: 08/12/2019 21:14      Scheduled Meds: . anastrozole  1 mg Oral Daily  . budesonide (PULMICORT) nebulizer solution  2 mg Nebulization BID   . divalproex  500 mg Oral Daily  . enoxaparin (LOVENOX) injection  40 mg Subcutaneous Daily  . feeding supplement (ENSURE ENLIVE)  237 mL Oral BID BM  . FLUoxetine  40 mg Oral Daily  . folic acid  1 mg Oral Daily  . ipratropium-albuterol  3 mL Nebulization Q6H  . levothyroxine  75 mcg Oral Q0600  . methylPREDNISolone (SOLU-MEDROL) injection  60 mg Intravenous Q6H  . multivitamin with minerals  1 tablet Oral Daily  . nicotine  14 mg Transdermal Daily  . pantoprazole  40 mg Oral Daily   Continuous Infusions: . azithromycin 500 mg (08/14/19 0835)  . cefTRIAXone (ROCEPHIN)  IV 1 g (08/13/19 2121)     LOS: 2 days      Time spent: 35 minutes   Dessa Phi, DO [facility] 08/14/2019, 10:52 AM   Available via Epic secure chat 7am-7pm After these hours, please refer to coverage provider listed on [email or web address]

## 2019-08-14 NOTE — Evaluation (Signed)
Occupational Therapy Evaluation Patient Details Name: Courtney Terry MRN: [id number] DOB: [birth year] Today's Date: 08/14/2019    History of Present Illness Courtney Terry is a 58 y.o. female s/p COPDe with medical history significant of asthma, COPD and chronic hypoxic respiratory failure on 3L home oxygen, hypertension, hypothyroidism, depression, anxiety, bipolar disorder, seizures, tobacco use.   Clinical Impression   Pt PTA:  Living with brother at home and PCA for 3 hours daily x7 days/wk. Brother cooks and PCA assists with ADL and transportation. Pt currently, pt performing ADL functional mobility with no AD and minguardA for safety. Pt supervisionA to minA overall for ADL as pt with poor to fair activity tolerance. Pt does not require continued OT skilled services as pt has PCA services at home and does not require additional OT skilled services. OT signing off.    Follow Up Recommendations  No OT follow up;Supervision - Intermittent    Equipment Recommendations  None recommended by OT    Recommendations for Other Services       Precautions / Restrictions Precautions Precautions: Fall;Other (comment) Precaution Comments: watch sats Restrictions Weight Bearing Restrictions: No      Mobility Bed Mobility Overal bed mobility: Modified Independent                Transfers Overall transfer level: Needs assistance Equipment used: None Transfers: Sit to/from Stand;Stand Pivot Transfers Sit to Stand: Min guard Stand pivot transfers: Min guard       General transfer comment: min guard for safety, no physical assist    Balance Overall balance assessment: Needs assistance Sitting-balance support: No upper extremity supported;Feet supported Sitting balance-Leahy Scale: Good Sitting balance - Comments: able to don socks EOB   Standing balance support: No upper extremity supported;During functional activity Standing balance-Leahy Scale: Fair                              ADL either performed or assessed with clinical judgement   ADL Overall ADL's : At baseline                                       General ADL Comments: Pt haa PCA and brother to assist at home. Pt performing ADL functional mobility with no AD at home and pt requires assist due to fair to poor activity tolerance.     Vision Baseline Vision/History: No visual deficits Vision Assessment?: No apparent visual deficits     Perception     Praxis      Pertinent Vitals/Pain Pain Assessment: No/denies pain     Hand Dominance Right   Extremity/Trunk Assessment Upper Extremity Assessment Upper Extremity Assessment: Generalized weakness   Lower Extremity Assessment Lower Extremity Assessment: Generalized weakness   Cervical / Trunk Assessment Cervical / Trunk Assessment: Kyphotic   Communication Communication Communication: No difficulties   Cognition Arousal/Alertness: Awake/alert Behavior During Therapy: WFL for tasks assessed/performed Overall Cognitive Status: Within Functional Limits for tasks assessed                                     General Comments  Pt on 4L O2 during session. SpO2 94% at rest and desat to 86% during ambulation.    Exercises     Shoulder Instructions  Home Living Family/patient expects to be discharged to:: Private residence Living Arrangements: Other relatives(brother) Available Help at Discharge: Personal care attendant;Family;Available 24 hours/day Type of Home: Apartment Home Access: Stairs to enter Entrance Stairs-Number of Steps: 4 Entrance Stairs-Rails: None Home Layout: One level     Bathroom Shower/Tub: Retail buyer Accessibility: Yes   Home Equipment: None   Additional Comments: 02 dependent at baseline      Prior Functioning/Environment Level of Independence: Needs assistance  Gait / Transfers Assistance Needed: Reports  independent with mobility; O2 tank requires assist from CNA; ambulatory with no AD  ADL's / Homemaking Assistance Needed: PCA goes with her to appts; assist with ADL/IADL 3 hours dailly M-Su            OT Problem List: Decreased activity tolerance      OT Treatment/Interventions:      OT Goals(Current goals can be found in the care plan section) Acute Rehab OT Goals Patient Stated Goal: breathe better OT Goal Formulation: With patient Time For Goal Achievement: 08/28/19 Potential to Achieve Goals: Good  OT Frequency:     Barriers to D/C:            Co-evaluation PT/OT/SLP Co-Evaluation/Treatment: Yes Reason for Co-Treatment: For patient/therapist safety PT goals addressed during session: Mobility/safety with mobility;Balance OT goals addressed during session: ADL's and self-care      AM-PAC OT "6 Clicks" Daily Activity     Outcome Measure Help from another person eating meals?: None Help from another person taking care of personal grooming?: A Little Help from another person toileting, which includes using toliet, bedpan, or urinal?: A Little Help from another person bathing (including washing, rinsing, drying)?: A Little Help from another person to put on and taking off regular upper body clothing?: None Help from another person to put on and taking off regular lower body clothing?: A Little 6 Click Score: 20   End of Session Equipment Utilized During Treatment: Gait belt Nurse Communication: Mobility status  Activity Tolerance: Patient tolerated treatment well Patient left: in bed;with call bell/phone within reach;with bed alarm set  OT Visit Diagnosis: Muscle weakness (generalized) (M62.81)                Time: [id number] OT Time Calculation (min): 23 min Charges:  OT General Charges $OT Visit: 1 Visit OT Evaluation $OT Eval Moderate Complexity: 1 Mod  Darryl Nestle) Marsa Aris OTR/L Acute Rehabilitation Services Pager: [phone] Office:  [place] 08/14/2019, 1:12 PM

## 2019-08-14 NOTE — Evaluation (Signed)
Physical Therapy Evaluation Patient Details Name: Courtney Terry MRN: [id number] DOB: 09-05-1961 Today's Date: 08/14/2019   History of Present Illness  Courtney Terry is a 58 y.o. female s/p COPDe with medical history significant of asthma, COPD and chronic hypoxic respiratory failure on 3L home oxygen, hypertension, hypothyroidism, depression, anxiety, bipolar disorder, seizures, tobacco use.    Clinical Impression  Pt admitted with above diagnosis. PTA pt lived at home with her brother. She has a PCA 3 hrs/day 7 days/week. She was I/mod I mobility without AD and required home O2. Pt reports she ended up back in the hospital because she starting smoking cigarettes again. On eval, pt required min guard assist transfers and min guard assist ambulation 90 feet with therapist managing port O2. Pt on 4L O2 with desat to 86% during ambulation. Pt presents with decreased balance and decreased activity tolerance. Pt currently with functional limitations due to the deficits listed below (see PT Problem List). Pt will benefit from skilled PT to increase their independence and safety with mobility to allow discharge to the venue listed below.       Follow Up Recommendations Home health PT;Supervision for mobility/OOB    Equipment Recommendations  None recommended by PT    Recommendations for Other Services       Precautions / Restrictions Precautions Precautions: Fall;Other (comment) Precaution Comments: watch sats Restrictions Weight Bearing Restrictions: No      Mobility  Bed Mobility Overal bed mobility: Modified Independent                Transfers Overall transfer level: Needs assistance Equipment used: None Transfers: Sit to/from Stand;Stand Pivot Transfers Sit to Stand: Min guard Stand pivot transfers: Min guard       General transfer comment: min guard for safety, no physical assist  Ambulation/Gait Ambulation/Gait assistance: Min guard;Min assist Gait Distance  (Feet): 90 Feet Assistive device: None Gait Pattern/deviations: Step-through pattern;Decreased stride length Gait velocity: decreased Gait velocity interpretation: 1.31 - 2.62 ft/sec, indicative of limited community ambulator General Gait Details: min guard for safety, assist with portable O2 tank. Desat to 86% on 4L.  Stairs            Wheelchair Mobility    Modified Rankin (Stroke Patients Only)       Balance Overall balance assessment: Needs assistance Sitting-balance support: No upper extremity supported;Feet supported Sitting balance-Leahy Scale: Good Sitting balance - Comments: able to don socks EOB   Standing balance support: No upper extremity supported;During functional activity Standing balance-Leahy Scale: Fair                               Pertinent Vitals/Pain Pain Assessment: No/denies pain    Home Living Family/patient expects to be discharged to:: Private residence Living Arrangements: Other relatives(brother) Available Help at Discharge: Personal care attendant;Family;Available 24 hours/day Type of Home: Apartment Home Access: Stairs to enter Entrance Stairs-Rails: None Entrance Stairs-Number of Steps: 4 Home Layout: One level Home Equipment: None Additional Comments: 02 dependent at baseline    Prior Function Level of Independence: Needs assistance   Gait / Transfers Assistance Needed: Reports independent with mobility; O2 tank requires assist from CNA; ambulatory with no AD   ADL's / Homemaking Assistance Needed: PCA goes with her to appts; assist with ADL/IADL 3 hours dailly M-Su        Hand Dominance   Dominant Hand: Right    Extremity/Trunk Assessment   Upper Extremity  Assessment Upper Extremity Assessment: Defer to OT evaluation    Lower Extremity Assessment Lower Extremity Assessment: Generalized weakness    Cervical / Trunk Assessment Cervical / Trunk Assessment: Kyphotic  Communication   Communication: No  difficulties  Cognition Arousal/Alertness: Awake/alert Behavior During Therapy: WFL for tasks assessed/performed Overall Cognitive Status: Within Functional Limits for tasks assessed                                        General Comments General comments (skin integrity, edema, etc.): Pt on 4L O2 during session. SpO2 94% at rest and desat to 86% during ambulation.    Exercises     Assessment/Plan    PT Assessment Patient needs continued PT services  PT Problem List Decreased strength;Decreased mobility;Cardiopulmonary status limiting activity;Decreased activity tolerance;Decreased balance       PT Treatment Interventions Therapeutic activities;DME instruction;Gait training;Therapeutic exercise;Patient/family education;Balance training;Stair training;Functional mobility training    PT Goals (Current goals can be found in the Care Plan section)  Acute Rehab PT Goals Patient Stated Goal: breathe better PT Goal Formulation: With patient Time For Goal Achievement: 08/28/19 Potential to Achieve Goals: Good    Frequency Min 3X/week   Barriers to discharge        Co-evaluation PT/OT/SLP Co-Evaluation/Treatment: Yes Reason for Co-Treatment: To address functional/ADL transfers PT goals addressed during session: Mobility/safety with mobility;Balance         AM-PAC PT "6 Clicks" Mobility  Outcome Measure Help needed turning from your back to your side while in a flat bed without using bedrails?: None Help needed moving from lying on your back to sitting on the side of a flat bed without using bedrails?: None Help needed moving to and from a bed to a chair (including a wheelchair)?: A Little Help needed standing up from a chair using your arms (e.g., wheelchair or bedside chair)?: A Little Help needed to walk in hospital room?: A Little Help needed climbing 3-5 steps with a railing? : A Little 6 Click Score: 20    End of Session Equipment Utilized During  Treatment: Gait belt;Oxygen Activity Tolerance: Patient tolerated treatment well Patient left: in bed;with call bell/phone within reach Nurse Communication: Mobility status PT Visit Diagnosis: Unsteadiness on feet (R26.81);Muscle weakness (generalized) (M62.81)    Time: [id number] PT Time Calculation (min) (ACUTE ONLY): 25 min   Charges:   PT Evaluation $PT Eval Low Complexity: 1 Low          Lorrin Goodell, PT  Office # [phone] Pager [phone]   [name] 08/14/2019, 11:00 AM

## 2019-08-14 NOTE — Progress Notes (Signed)
Initial Nutrition Assessment  DOCUMENTATION CODES:   Not applicable  INTERVENTION:  -Ensure Enlive po BID, each supplement provides 350 kcal and 20 grams of protein -MVI with minerals daily   NUTRITION DIAGNOSIS:   Increased nutrient needs related to catabolic illness(COPD, chronic hypoxic respiratory failure on 3L O2 at baseline) as evidenced by estimated needs.  GOAL:   Patient will meet greater than or equal to 90% of their needs  MONITOR:   PO intake, Labs, I & O's, Supplement acceptance, Weight trends  REASON FOR ASSESSMENT:  RD working remotely.  Consult Assessment of nutrition requirement/status  ASSESSMENT:  58 year old female with past medical history significant for asthma, COPD, chronic hypoxic respiratory failure on 3L home O2, HTN, hypothyroidism, depression/anxiety, bipolar disorder, seizures who presents to ED with complaints of productive cough, SOB, and wheezing over the past week.  Patient admitted with COPD with acute exacerbation.  Per chart review, patient reports crack cocaine and cocaine abuse. Patient continues to smoke and uses home O2, chronically on 10 mg prednisone/day.  Unable to obtain nutrition history at this time. Per chart review, patient currently requiring BiPAP. She is eating 50-100% of meals. RD will provide Ensure to aid with calorie/protien needs. Will continue to monitor for po intake; full assessment to follow.  Weight history reviewed: trending up over the past 8 months, noted 38.7 lb wt gain since March. Suspect previous underwt status related to reported history of drug abuse. Patient current wt is 105% of IBW 08/14/19 67 kg  07/29/19 67.3 kg  07/23/19 65.9 kg  04/07/19 55.7 kg  12/30/18 49.4 kg  12/29/18 49.4 kg    Medications reviewed and include: Depakote, Prozac, Folic acid, Methylprednisolone, Protonix Zithromax Rocephin Labs: reviewed   NUTRITION - FOCUSED PHYSICAL EXAM: Unable to complete at this time, RD  working remotely.  Diet Order:   Diet Order            Diet regular Room service appropriate? Yes; Fluid consistency: Thin  Diet effective now              EDUCATION NEEDS:   Not appropriate for education at this time  Skin:  Skin Assessment: Reviewed RN Assessment  Last BM:  PTA  Height:   Ht Readings from Last 1 Encounters:  08/13/19 5\' 9"  (1.753 m)    Weight:   Wt Readings from Last 1 Encounters:  08/14/19 67 kg    Ideal Body Weight:  63.6 kg  BMI:  Body mass index is 21.81 kg/m.  Estimated Nutritional Needs:   Kcal:  2000-2200  Protein:  100-110  Fluid:  >/= 2 L/day  Lajuan Lines, RD, LDN Clinical Nutrition [facility] Telephone [phone] After Hours/Weekend Pager: [phone]

## 2019-08-14 NOTE — Plan of Care (Signed)
  Problem: Education: Goal: Knowledge of disease or condition will improve Outcome: Progressing Goal: Knowledge of the prescribed therapeutic regimen will improve Outcome: Progressing Goal: Individualized Educational Video(s) Outcome: Progressing   Problem: Activity: Goal: Ability to tolerate increased activity will improve Outcome: Progressing Goal: Will verbalize the importance of balancing activity with adequate rest periods Outcome: Progressing   Problem: Respiratory: Goal: Ability to maintain a clear airway will improve Outcome: Progressing Goal: Levels of oxygenation will improve Outcome: Progressing Goal: Ability to maintain adequate ventilation will improve Outcome: Progressing   Problem: Education: Goal: Ability to demonstrate management of disease process will improve Outcome: Progressing Goal: Ability to verbalize understanding of medication therapies will improve Outcome: Progressing Goal: Individualized Educational Video(s) Outcome: Progressing   Problem: Activity: Goal: Capacity to carry out activities will improve Outcome: Progressing   Problem: Cardiac: Goal: Ability to achieve and maintain adequate cardiopulmonary perfusion will improve Outcome: Progressing   Problem: Education: Goal: Knowledge of General Education information will improve Description: Including pain rating scale, medication(s)/side effects and non-pharmacologic comfort measures Outcome: Progressing   Problem: Health Behavior/Discharge Planning: Goal: Ability to manage health-related needs will improve Outcome: Progressing   Problem: Clinical Measurements: Goal: Ability to maintain clinical measurements within normal limits will improve Outcome: Progressing Goal: Will remain free from infection Outcome: Progressing Goal: Diagnostic test results will improve Outcome: Progressing Goal: Respiratory complications will improve Outcome: Progressing Goal: Cardiovascular complication  will be avoided Outcome: Progressing   Problem: Activity: Goal: Risk for activity intolerance will decrease Outcome: Progressing   Problem: Nutrition: Goal: Adequate nutrition will be maintained Outcome: Progressing   Problem: Coping: Goal: Level of anxiety will decrease Outcome: Progressing   Problem: Elimination: Goal: Will not experience complications related to bowel motility Outcome: Progressing Goal: Will not experience complications related to urinary retention Outcome: Progressing   Problem: Pain Managment: Goal: General experience of comfort will improve Outcome: Progressing   Problem: Safety: Goal: Ability to remain free from injury will improve Outcome: Progressing   Problem: Skin Integrity: Goal: Risk for impaired skin integrity will decrease Outcome: Progressing

## 2019-08-14 NOTE — Plan of Care (Signed)
  Problem: Education: Goal: Knowledge of disease or condition will improve Outcome: Progressing Goal: Knowledge of the prescribed therapeutic regimen will improve Outcome: Progressing Goal: Individualized Educational Video(s) Outcome: Progressing   Problem: Activity: Goal: Ability to tolerate increased activity will improve Outcome: Progressing Goal: Will verbalize the importance of balancing activity with adequate rest periods Outcome: Progressing   Problem: Respiratory: Goal: Ability to maintain a clear airway will improve Outcome: Progressing Goal: Levels of oxygenation will improve Outcome: Progressing Goal: Ability to maintain adequate ventilation will improve Outcome: Progressing   Problem: Respiratory: Goal: Ability to maintain a clear airway will improve Outcome: Progressing Goal: Levels of oxygenation will improve Outcome: Progressing Goal: Ability to maintain adequate ventilation will improve Outcome: Progressing   Problem: Activity: Goal: Ability to tolerate increased activity will improve Outcome: Progressing Goal: Will verbalize the importance of balancing activity with adequate rest periods Outcome: Progressing   Problem: Education: Goal: Ability to demonstrate management of disease process will improve Outcome: Progressing Goal: Ability to verbalize understanding of medication therapies will improve Outcome: Progressing Goal: Individualized Educational Video(s) Outcome: Progressing   Problem: Cardiac: Goal: Ability to achieve and maintain adequate cardiopulmonary perfusion will improve Outcome: Progressing   Problem: Education: Goal: Knowledge of General Education information will improve Description: Including pain rating scale, medication(s)/side effects and non-pharmacologic comfort measures Outcome: Progressing   Problem: Health Behavior/Discharge Planning: Goal: Ability to manage health-related needs will improve Outcome: Progressing    Problem: Clinical Measurements: Goal: Ability to maintain clinical measurements within normal limits will improve Outcome: Progressing Goal: Will remain free from infection Outcome: Progressing Goal: Diagnostic test results will improve Outcome: Progressing Goal: Respiratory complications will improve Outcome: Progressing Goal: Cardiovascular complication will be avoided Outcome: Progressing   Problem: Elimination: Goal: Will not experience complications related to bowel motility Outcome: Progressing Goal: Will not experience complications related to urinary retention Outcome: Progressing   Problem: Coping: Goal: Level of anxiety will decrease Outcome: Progressing   Problem: Nutrition: Goal: Adequate nutrition will be maintained Outcome: Progressing   Problem: Activity: Goal: Risk for activity intolerance will decrease Outcome: Progressing   Problem: Clinical Measurements: Goal: Ability to maintain clinical measurements within normal limits will improve Outcome: Progressing Goal: Will remain free from infection Outcome: Progressing Goal: Diagnostic test results will improve Outcome: Progressing Goal: Respiratory complications will improve Outcome: Progressing Goal: Cardiovascular complication will be avoided Outcome: Progressing   Problem: Coping: Goal: Level of anxiety will decrease Outcome: Progressing   Problem: Elimination: Goal: Will not experience complications related to bowel motility Outcome: Progressing Goal: Will not experience complications related to urinary retention Outcome: Progressing   Problem: Pain Managment: Goal: General experience of comfort will improve Outcome: Progressing   Problem: Skin Integrity: Goal: Risk for impaired skin integrity will decrease Outcome: Progressing   Problem: Safety: Goal: Ability to remain free from injury will improve Outcome: Progressing

## 2019-08-15 LAB — CBC
HCT: 41.4 % (ref 36.0–46.0)
Hemoglobin: 11.8 g/dL — ABNORMAL LOW (ref 12.0–15.0)
MCH: 27.7 pg (ref 26.0–34.0)
MCHC: 28.5 g/dL — ABNORMAL LOW (ref 30.0–36.0)
MCV: 97.2 fL (ref 80.0–100.0)
Platelets: 266 10*3/uL (ref 150–400)
RBC: 4.26 MIL/uL (ref 3.87–5.11)
RDW: 14.2 % (ref 11.5–15.5)
WBC: 15.1 10*3/uL — ABNORMAL HIGH (ref 4.0–10.5)
nRBC: 0.1 % (ref 0.0–0.2)

## 2019-08-15 MED ORDER — HYDROCOD POLST-CPM POLST ER 10-8 MG/5ML PO SUER
5.0000 mL | Freq: Once | ORAL | Status: AC
  Administered 2019-08-15: 5 mL via ORAL

## 2019-08-15 NOTE — Progress Notes (Signed)
Pt refused bipap on  All night shift, rest  With 4 l n/c, sob when exertion.continue monitor

## 2019-08-15 NOTE — Progress Notes (Signed)
Pt refusing BIPAP at this time. Will monitor

## 2019-08-15 NOTE — Progress Notes (Signed)
PROGRESS NOTE    Courtney Terry  [id number] DOB: 09/14/1961 DOA: 08/12/2019 PCP: [name], MD     Brief Narrative:  Courtney Spiers Tinsleyis a 58 y.o.femalewith medical history significant ofasthma, COPDand chronic hypoxic respiratory failure on 3L home oxygen, hypertension, hypothyroidism, depression, anxiety, bipolar disorder, seizures, tobacco use presenting with complaints of dyspnea.Patient has had a productive cough, shortness of breath, and wheezing for the past 1 week. She is coughing up a lot of mucus. Last night around 7 PM she became acutely worse and could hardly breathe. On arrival to the ED, patient was tachycardic, tachypneic, wheezing, and had significantly increased work of breathing. Oxygen saturation 97% on 3 L home oxygen. Initial ABG showing pH 7.19, PCO2 102, and PO2 108. Patient was placed on BiPAP and her work of breathing improved. She was given albuterol, Atrovent, magnesium 2 g, and Solu-Medrol 125 mg. Afebrile. White blood cell count 18.8. BNP normal. Chest x-ray with findings consistent with COPD and no acute cardiopulmonary disease.   New events last 24 hours / Subjective: Refused BiPAP overnight, continues to have productive cough and shortness of breath at rest  Assessment & Plan:   Principal Problem:   COPD with acute exacerbation (HCC) Active Problems:   Hypertension   Tobacco use   Acute on chronic respiratory failure with hypoxia and hypercapnia (HCC)   COPD exacerbation (HCC)   Acute on chronic hypoxemic hypercapnic respiratory failure secondary to COPD exacerbation -COVID 19 negative -Respiratory viral panel ? Looks like it was canceled ?  -BiPAP and wean to home 3L [place] O2 as able. Currently on 3L and has been refusing BiPAP -PCCM consulted -Deescalated to prednisone 20 mg twice daily (on 10mg  prednisone at baseline), nebs, Omnicef, azithromycin -After discussion with PCCM, patient declined intubation in setting of  decompensation. Now DNR.   Tobacco abuse -Cessation counseling -Nicotine patch  Hypothyroidism -Synthroid  Anxiety -Klonopin, Prozac   Seizure disorder -Depakote   DVT prophylaxis: Lovenox Code Status: DNR Family Communication: None at bedside Disposition Plan: Pending clinical improvement, continues to have tenuous respiratory status and not back to baseline   Consultants:   PCCM  Procedures:   None   Antimicrobials:  Anti-infectives (From admission, onward)   Start     Dose/Rate Route Frequency Ordered Stop   08/14/19 1115  azithromycin (ZITHROMAX) tablet 250 mg     250 mg Oral Daily 08/14/19 1111 08/18/19 0959   08/14/19 1115  cefdinir (OMNICEF) capsule 300 mg     300 mg Oral Every 12 hours 08/14/19 1111 08/20/19 0959   08/13/19 0900  azithromycin (ZITHROMAX) 500 mg in sodium chloride 0.9 % 250 mL IVPB  Status:  Discontinued     500 mg 250 mL/hr over 60 Minutes Intravenous Every 24 hours 08/13/19 0844 08/14/19 1111   08/13/19 0130  cefTRIAXone (ROCEPHIN) 1 g in sodium chloride 0.9 % 100 mL IVPB  Status:  Discontinued     1 g 200 mL/hr over 30 Minutes Intravenous Daily at bedtime 08/13/19 0114 08/14/19 1111       Objective: Vitals:   08/15/19 0443 08/15/19 0558 08/15/19 0728 08/15/19 0733  BP:  (!) 123/100    Pulse: 83 95    Resp:  20    Temp:  98.4 F (36.9 C)    TempSrc:  Oral    SpO2:  97% 100% 100%  Weight:  66.5 kg    Height:        Intake/Output Summary (Last 24 hours) at 08/15/2019 1035  Last data filed at 08/15/2019 0600 Gross per 24 hour  Intake 480 ml  Output 900 ml  Net -420 ml   Filed Weights   08/13/19 1655 08/14/19 0642 08/15/19 0558  Weight: 66.4 kg 67 kg 66.5 kg    Examination: General exam: Appears calm  Respiratory system: Decreased breath sounds diffusely. Respiratory effort increased from baseline.  On 3 L nasal cannula O2 Cardiovascular system: S1 & S2 heard, RRR. No pedal edema. Gastrointestinal system: Abdomen is  nondistended, soft and nontender. Normal bowel sounds heard. Central nervous system: Alert and oriented. Non focal exam. Speech clear  Extremities: Symmetric in appearance bilaterally  Skin: No rashes, lesions or ulcers on exposed skin  Psychiatry: Judgement and insight appear stable. Mood & affect appropriate.    Data Reviewed: I have personally reviewed following labs and imaging studies  CBC: Recent Labs  Lab 08/12/19 2106 08/13/19 0500 08/14/19 0404 08/15/19 0413  WBC 18.8* 17.0* 13.8* 15.1*  HGB 13.5 12.9 11.3* 11.8*  HCT 45.8 45.2 39.7 41.4  MCV 95.6 97.2 96.6 97.2  PLT 284 282 251 [id number]   Basic Metabolic Panel: Recent Labs  Lab 08/12/19 2106 08/14/19 0404  NA 140 139  K 4.5 5.1  CL 97* 95*  CO2 31 36*  GLUCOSE 134* 123*  BUN 10 15  CREATININE 0.48 0.43*  CALCIUM 9.3 9.2   GFR: Estimated Creatinine Clearance: 81.1 mL/min (A) (by C-G formula based on SCr of 0.43 mg/dL (L)). Liver Function Tests: No results for input(s): AST, ALT, ALKPHOS, BILITOT, PROT, ALBUMIN in the last 168 hours. No results for input(s): LIPASE, AMYLASE in the last 168 hours. No results for input(s): AMMONIA in the last 168 hours. Coagulation Profile: No results for input(s): INR, PROTIME in the last 168 hours. Cardiac Enzymes: No results for input(s): CKTOTAL, CKMB, CKMBINDEX, TROPONINI in the last 168 hours. BNP (last 3 results) No results for input(s): PROBNP in the last 8760 hours. HbA1C: No results for input(s): HGBA1C in the last 72 hours. CBG: Recent Labs  Lab 08/14/19 1546  GLUCAP 136*   Lipid Profile: No results for input(s): CHOL, HDL, LDLCALC, TRIG, CHOLHDL, LDLDIRECT in the last 72 hours. Thyroid Function Tests: No results for input(s): TSH, T4TOTAL, FREET4, T3FREE, THYROIDAB in the last 72 hours. Anemia Panel: No results for input(s): VITAMINB12, FOLATE, FERRITIN, TIBC, IRON, RETICCTPCT in the last 72 hours. Sepsis Labs: No results for input(s): PROCALCITON,  LATICACIDVEN in the last 168 hours.  Recent Results (from the past 240 hour(s))  SARS CORONAVIRUS 2 (TAT 6-24 HRS) Nasopharyngeal Nasopharyngeal Swab     Status: None   Collection Time: 08/12/19  9:25 PM   Specimen: Nasopharyngeal Swab  Result Value Ref Range Status   SARS Coronavirus 2 NEGATIVE NEGATIVE Final    Comment: (NOTE) SARS-CoV-2 target nucleic acids are NOT DETECTED. The SARS-CoV-2 RNA is generally detectable in upper and lower respiratory specimens during the acute phase of infection. Negative results do not preclude SARS-CoV-2 infection, do not rule out co-infections with other pathogens, and should not be used as the sole basis for treatment or other patient management decisions. Negative results must be combined with clinical observations, patient history, and epidemiological information. The expected result is Negative. Fact Sheet for Patients: SugarRoll.be Fact Sheet for Healthcare Providers: https://www.woods-mathews.com/ This test is not yet approved or cleared by the [place] FDA and  has been authorized for detection and/or diagnosis of SARS-CoV-2 by FDA under an Emergency Use Authorization (EUA). This EUA will remain  in  effect (meaning this test can be used) for the duration of the COVID-19 declaration under Section 56 4(b)(1) of the Act, 21 U.S.C. section 360bbb-3(b)(1), unless the authorization is terminated or revoked sooner. Performed at [facility], [place] [address] [place], [place] [address]       Radiology Studies: No results found.    Scheduled Meds: . anastrozole  1 mg Oral Daily  . azithromycin  250 mg Oral Daily  . budesonide (PULMICORT) nebulizer solution  2 mg Nebulization BID  . cefdinir  300 mg Oral Q12H  . divalproex  500 mg Oral Daily  . enoxaparin (LOVENOX) injection  40 mg Subcutaneous Daily  . feeding supplement (ENSURE ENLIVE)  237 mL Oral BID BM  . folic acid  1 mg Oral  Daily  . ipratropium-albuterol  3 mL Nebulization Q6H  . levothyroxine  75 mcg Oral Q0600  . multivitamin with minerals  1 tablet Oral Daily  . nicotine  14 mg Transdermal Daily  . pantoprazole  40 mg Oral Daily  . predniSONE  20 mg Oral BID WC   Continuous Infusions:    LOS: 3 days      Time spent: 25 minutes   Courtney Phi, DO [facility] 08/15/2019, 10:35 AM   Available via Epic secure chat 7am-7pm After these hours, please refer to coverage provider listed on [email or web address]

## 2019-08-15 NOTE — TOC Initial Note (Signed)
Transition of Care [facility]) - Initial/Assessment Note    Patient Details  Name: Courtney Terry MRN: [id number] Date of Birth: 23-Dec-1960  Transition of Care [facility]) CM/SW Contact:    Carles Collet, RN Phone Number: 08/15/2019, 7:59 AM  Clinical Narrative:               Patient admitted from home w spouse. Medical history significant of asthma, COPD and chronic hypoxic respiratory failure on home oxygen, hypertension, hypothyroidism, depression, anxiety, bipolar disorder, seizures, tobacco use presenting with complaints of dyspnea  PCP  Osei-Bonsu, Iona Beard,  Coverage -Medicaid    Expected Discharge Plan: Home/Self Care Barriers to Discharge: Continued Medical Work up   Patient Goals and CMS Choice        Expected Discharge Plan and Services Expected Discharge Plan: Home/Self Care                                              Prior Living Arrangements/Services                       Activities of Daily Living      Permission Sought/Granted                  Emotional Assessment              Admission diagnosis:  Hypercapnia [R06.89] COPD with acute exacerbation ([place]) [J44.1] COPD exacerbation ([place]) [J44.1] Patient Active Problem List   Diagnosis Date Noted  . Acute on chronic respiratory failure with hypoxia and hypercapnia ([place]) 08/13/2019  . COPD exacerbation ([place]) 08/13/2019  . COPD with acute exacerbation ([place]) 08/12/2019  . Acute blood loss anemia   . Labile blood glucose   . Idiopathic hypotension   . Bipolar affective disorder in remission ([place])   . Hypoxemia   . Severely underweight adult 09/13/2018  . Syncope 09/12/2018  . Tobacco use 05/01/2018  . Depression 01/26/2018  . CHF (congestive heart failure) ([place]) 11/11/2017  . PID (acute pelvic inflammatory disease) 11/11/2017  . Seizures ([place])   . Hypertension   . Goals of care, counseling/discussion   . Rhinovirus infection 09/30/2016  . Hypothyroidism 09/29/2016  .  Bipolar I disorder ([place]) 09/29/2016  . Polysubstance abuse ([place]) 08/13/2016  . Cocaine abuse ([employer]) 08/13/2016  . Schizophrenia ([place]) 04/23/2016  . Chronic respiratory failure with hypoxia ([place]) 03/29/2016  . Protein-calorie malnutrition, severe 02/27/2016  . History of breast cancer 10/20/2015  . Exposure of implanted prstht mtrl to surrnd org/tiss, init 06/19/2015  . Complication of internal breast prosthesis 06/19/2015  . Acquired absence of breast and nipple 06/06/2015  . H/O right mastectomy 06/06/2015  . COPD GOLD IV D 02/27/2013  . Malignant neoplasm of upper-outer quadrant of female breast ([place]) 01/31/2012  . Epilepsy ([place]) 12/02/2007   PCP:  [name], MD Pharmacy:   [place], [place] [address] Phone: [phone] Fax: [phone]  [id number] - [place], [address] [place] [place] [address] Phone: [phone] Fax: [phone]  [place], [place] [address] [place] [address] Phone: [phone] Fax: [phone]     Social Determinants of Health (SDOH) Interventions    Readmission Risk Interventions No flowsheet data  found.  

## 2019-08-16 ENCOUNTER — Encounter (HOSPITAL_COMMUNITY): Payer: Self-pay | Admitting: General Practice

## 2019-08-16 LAB — CBC
HCT: 45.1 % (ref 36.0–46.0)
Hemoglobin: 12.8 g/dL (ref 12.0–15.0)
MCH: 27.3 pg (ref 26.0–34.0)
MCHC: 28.4 g/dL — ABNORMAL LOW (ref 30.0–36.0)
MCV: 96.2 fL (ref 80.0–100.0)
Platelets: 266 10*3/uL (ref 150–400)
RBC: 4.69 MIL/uL (ref 3.87–5.11)
RDW: 13.9 % (ref 11.5–15.5)
WBC: 12.5 10*3/uL — ABNORMAL HIGH (ref 4.0–10.5)
nRBC: 0 % (ref 0.0–0.2)

## 2019-08-16 MED ORDER — IPRATROPIUM-ALBUTEROL 0.5-2.5 (3) MG/3ML IN SOLN
3.0000 mL | Freq: Three times a day (TID) | RESPIRATORY_TRACT | Status: DC
  Administered 2019-08-16 – 2019-08-18 (×6): 3 mL via RESPIRATORY_TRACT
  Filled 2019-08-16 (×6): qty 3

## 2019-08-16 MED ORDER — ARFORMOTEROL TARTRATE 15 MCG/2ML IN NEBU
15.0000 ug | INHALATION_SOLUTION | Freq: Two times a day (BID) | RESPIRATORY_TRACT | Status: DC
  Administered 2019-08-16 – 2019-08-20 (×9): 15 ug via RESPIRATORY_TRACT
  Filled 2019-08-16 (×8): qty 2

## 2019-08-16 NOTE — Plan of Care (Signed)
  Problem: Education: Goal: Knowledge of disease or condition will improve Outcome: Progressing Goal: Knowledge of the prescribed therapeutic regimen will improve Outcome: Progressing Goal: Individualized Educational Video(s) Outcome: Progressing   Problem: Activity: Goal: Ability to tolerate increased activity will improve Outcome: Progressing Goal: Will verbalize the importance of balancing activity with adequate rest periods Outcome: Progressing   Problem: Respiratory: Goal: Ability to maintain a clear airway will improve Outcome: Progressing Goal: Levels of oxygenation will improve Outcome: Progressing Goal: Ability to maintain adequate ventilation will improve Outcome: Progressing   Problem: Education: Goal: Ability to demonstrate management of disease process will improve Outcome: Progressing Goal: Ability to verbalize understanding of medication therapies will improve Outcome: Progressing Goal: Individualized Educational Video(s) Outcome: Progressing   Problem: Activity: Goal: Capacity to carry out activities will improve Outcome: Progressing   Problem: Cardiac: Goal: Ability to achieve and maintain adequate cardiopulmonary perfusion will improve Outcome: Progressing   Problem: Education: Goal: Knowledge of General Education information will improve Description: Including pain rating scale, medication(s)/side effects and non-pharmacologic comfort measures Outcome: Progressing   Problem: Health Behavior/Discharge Planning: Goal: Ability to manage health-related needs will improve Outcome: Progressing   Problem: Clinical Measurements: Goal: Ability to maintain clinical measurements within normal limits will improve Outcome: Progressing Goal: Will remain free from infection Outcome: Progressing Goal: Diagnostic test results will improve Outcome: Progressing Goal: Respiratory complications will improve Outcome: Progressing Goal: Cardiovascular complication  will be avoided Outcome: Progressing   Problem: Activity: Goal: Risk for activity intolerance will decrease Outcome: Progressing   Problem: Nutrition: Goal: Adequate nutrition will be maintained Outcome: Progressing   Problem: Coping: Goal: Level of anxiety will decrease Outcome: Progressing   Problem: Elimination: Goal: Will not experience complications related to bowel motility Outcome: Progressing Goal: Will not experience complications related to urinary retention Outcome: Progressing   Problem: Pain Managment: Goal: General experience of comfort will improve Outcome: Progressing   Problem: Safety: Goal: Ability to remain free from injury will improve Outcome: Progressing   Problem: Skin Integrity: Goal: Risk for impaired skin integrity will decrease Outcome: Progressing

## 2019-08-16 NOTE — Progress Notes (Signed)
PROGRESS NOTE    Courtney Terry  [id number] DOB: 08-10-1961 DOA: 08/12/2019 PCP: [name], MD     Brief Narrative:  Courtney Notz Tinsleyis a 58 y.o.femalewith medical history significant ofasthma, COPDand chronic hypoxic respiratory failure on 3L home oxygen, hypertension, hypothyroidism, depression, anxiety, bipolar disorder, seizures, tobacco use presenting with complaints of dyspnea.Patient has had a productive cough, shortness of breath, and wheezing for the past 1 week. She is coughing up a lot of mucus. Last night around 7 PM she became acutely worse and could hardly breathe. On arrival to the ED, patient was tachycardic, tachypneic, wheezing, and had significantly increased work of breathing. Oxygen saturation 97% on 3 L home oxygen. Initial ABG showing pH 7.19, PCO2 102, and PO2 108. Patient was placed on BiPAP and her work of breathing improved. She was given albuterol, Atrovent, magnesium 2 g, and Solu-Medrol 125 mg. Afebrile. White blood cell count 18.8. BNP normal. Chest x-ray with findings consistent with COPD and no acute cardiopulmonary disease.   New events last 24 hours / Subjective: Wore BiPAP for about 3 hours last night.  Feeling better since admission but not back to baseline and "not ready to go home yet."  Patient states that she has not ambulated   Assessment & Plan:   Principal Problem:   COPD with acute exacerbation ([place]) Active Problems:   Hypertension   Tobacco use   Acute on chronic respiratory failure with hypoxia and hypercapnia (HCC)   COPD exacerbation (HCC)   Acute on chronic hypoxemic hypercapnic respiratory failure secondary to COPD exacerbation -COVID 19 negative -BiPAP and wean to home 3L  O2 as able. Currently on 3L  -PCCM following -Deescalated to prednisone 20 mg twice daily (on 10mg  prednisone at baseline), nebs, Omnicef, azithromycin -After discussion with PCCM, patient declined intubation in setting of  decompensation. Now DNR.   Tobacco abuse -Cessation counseling -Nicotine patch  Hypothyroidism -Synthroid  Anxiety -Klonopin, Prozac   Seizure disorder -Depakote   DVT prophylaxis: Lovenox Code Status: DNR Family Communication: None at bedside Disposition Plan: Pending clinical improvement, breathing not back to baseline yet.  Plan to DC with home health on discharge   Consultants:   PCCM  Procedures:   None   Antimicrobials:  Anti-infectives (From admission, onward)   Start     Dose/Rate Route Frequency Ordered Stop   08/14/19 1115  azithromycin (ZITHROMAX) tablet 250 mg     250 mg Oral Daily 08/14/19 1111 08/18/19 0959   08/14/19 1115  cefdinir (OMNICEF) capsule 300 mg     300 mg Oral Every 12 hours 08/14/19 1111 08/20/19 0959   08/13/19 0900  azithromycin (ZITHROMAX) 500 mg in sodium chloride 0.9 % 250 mL IVPB  Status:  Discontinued     500 mg 250 mL/hr over 60 Minutes Intravenous Every 24 hours 08/13/19 0844 08/14/19 1111   08/13/19 0130  cefTRIAXone (ROCEPHIN) 1 g in sodium chloride 0.9 % 100 mL IVPB  Status:  Discontinued     1 g 200 mL/hr over 30 Minutes Intravenous Daily at bedtime 08/13/19 0114 08/14/19 1111       Objective: Vitals:   08/16/19 0902 08/16/19 1107 08/16/19 1151 08/16/19 1224  BP: (!) 142/72  124/84   Pulse: 100  87   Resp: 20  17   Temp:   98.4 F (36.9 C)   TempSrc:   Oral   SpO2: 96% 100% 100% 99%  Weight:      Height:  Intake/Output Summary (Last 24 hours) at 08/16/2019 1256 Last data filed at 08/16/2019 1224 Gross per 24 hour  Intake 730 ml  Output 2100 ml  Net -1370 ml   Filed Weights   08/14/19 0642 08/15/19 0558 08/16/19 0604  Weight: 67 kg 66.5 kg 66.1 kg    Examination: General exam: Appears calm and comfortable  Respiratory system: Decreased breath sounds diffusely.  Mild increase in respiratory effort Cardiovascular system: S1 & S2 heard, RRR. No pedal edema. Gastrointestinal system: Abdomen is  nondistended, soft and nontender. Normal bowel sounds heard. Central nervous system: Alert and oriented. Non focal exam. Speech clear  Extremities: Symmetric in appearance bilaterally  Skin: No rashes, lesions or ulcers on exposed skin  Psychiatry: Judgement and insight appear stable. Mood & affect appropriate.    Data Reviewed: I have personally reviewed following labs and imaging studies  CBC: Recent Labs  Lab 08/12/19 2106 08/13/19 0500 08/14/19 0404 08/15/19 0413 08/16/19 0455  WBC 18.8* 17.0* 13.8* 15.1* 12.5*  HGB 13.5 12.9 11.3* 11.8* 12.8  HCT 45.8 45.2 39.7 41.4 45.1  MCV 95.6 97.2 96.6 97.2 96.2  PLT 284 282 251 [id number]   Basic Metabolic Panel: Recent Labs  Lab 08/12/19 2106 08/14/19 0404  NA 140 139  K 4.5 5.1  CL 97* 95*  CO2 31 36*  GLUCOSE 134* 123*  BUN 10 15  CREATININE 0.48 0.43*  CALCIUM 9.3 9.2   GFR: Estimated Creatinine Clearance: 81 mL/min (A) (by C-G formula based on SCr of 0.43 mg/dL (L)). Liver Function Tests: No results for input(s): AST, ALT, ALKPHOS, BILITOT, PROT, ALBUMIN in the last 168 hours. No results for input(s): LIPASE, AMYLASE in the last 168 hours. No results for input(s): AMMONIA in the last 168 hours. Coagulation Profile: No results for input(s): INR, PROTIME in the last 168 hours. Cardiac Enzymes: No results for input(s): CKTOTAL, CKMB, CKMBINDEX, TROPONINI in the last 168 hours. BNP (last 3 results) No results for input(s): PROBNP in the last 8760 hours. HbA1C: No results for input(s): HGBA1C in the last 72 hours. CBG: Recent Labs  Lab 08/14/19 1546  GLUCAP 136*   Lipid Profile: No results for input(s): CHOL, HDL, LDLCALC, TRIG, CHOLHDL, LDLDIRECT in the last 72 hours. Thyroid Function Tests: No results for input(s): TSH, T4TOTAL, FREET4, T3FREE, THYROIDAB in the last 72 hours. Anemia Panel: No results for input(s): VITAMINB12, FOLATE, FERRITIN, TIBC, IRON, RETICCTPCT in the last 72 hours. Sepsis Labs: No  results for input(s): PROCALCITON, LATICACIDVEN in the last 168 hours.  Recent Results (from the past 240 hour(s))  SARS CORONAVIRUS 2 (TAT 6-24 HRS) Nasopharyngeal Nasopharyngeal Swab     Status: None   Collection Time: 08/12/19  9:25 PM   Specimen: Nasopharyngeal Swab  Result Value Ref Range Status   SARS Coronavirus 2 NEGATIVE NEGATIVE Final    Comment: (NOTE) SARS-CoV-2 target nucleic acids are NOT DETECTED. The SARS-CoV-2 RNA is generally detectable in upper and lower respiratory specimens during the acute phase of infection. Negative results do not preclude SARS-CoV-2 infection, do not rule out co-infections with other pathogens, and should not be used as the sole basis for treatment or other patient management decisions. Negative results must be combined with clinical observations, patient history, and epidemiological information. The expected result is Negative. Fact Sheet for Patients: SugarRoll.be Fact Sheet for Healthcare Providers: https://www.woods-mathews.com/ This test is not yet approved or cleared by the [place] FDA and  has been authorized for detection and/or diagnosis of SARS-CoV-2 by FDA under  an Emergency Use Authorization (EUA). This EUA will remain  in effect (meaning this test can be used) for the duration of the COVID-19 declaration under Section 56 4(b)(1) of the Act, 21 U.S.C. section 360bbb-3(b)(1), unless the authorization is terminated or revoked sooner. Performed at [facility], [place] [address], [place], [place] [address]       Radiology Studies: No results found.    Scheduled Meds: . anastrozole  1 mg Oral Daily  . azithromycin  250 mg Oral Daily  . budesonide (PULMICORT) nebulizer solution  2 mg Nebulization BID  . cefdinir  300 mg Oral Q12H  . divalproex  500 mg Oral Daily  . enoxaparin (LOVENOX) injection  40 mg Subcutaneous Daily  . feeding supplement (ENSURE ENLIVE)  237 mL Oral  BID BM  . folic acid  1 mg Oral Daily  . ipratropium-albuterol  3 mL Nebulization TID  . levothyroxine  75 mcg Oral Q0600  . multivitamin with minerals  1 tablet Oral Daily  . nicotine  14 mg Transdermal Daily  . pantoprazole  40 mg Oral Daily  . predniSONE  20 mg Oral BID WC   Continuous Infusions:    LOS: 4 days      Time spent: 25 minutes   Dessa Phi, DO [facility] 08/16/2019, 12:56 PM   Available via Epic secure chat 7am-7pm After these hours, please refer to coverage provider listed on [email or web address]

## 2019-08-16 NOTE — Progress Notes (Signed)
Per CCMD pt HR went up for few seconds in 140's and came back down immediatly. Patient asymptomatic, eating dinner.
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
## 2019-08-16 NOTE — Progress Notes (Signed)
Physical Therapy Treatment Patient Details Name: Courtney Terry MRN: [id number] DOB: May 06, 1961 Today's Date: 08/16/2019    History of Present Illness 58 y.o. female s/p COPDe with medical history significant of asthma, COPD and chronic hypoxic respiratory failure on 3L home oxygen, hypertension, hypothyroidism, depression, anxiety, bipolar disorder, seizures, tobacco use.    PT Comments    Pt was seen for mobility and to assess O2 sats with gait and standing, noted pregait O2 sat was 100% and post gait was 95% with pulse 107.  Pt is tired and SOB but able to rest on side of bed using 4L O2 via nasal cannula without too much distress.  Nursing came in to replace the bipap to allow her to rest, and will resume therapy next time with more strengthening to LE's as tolerated.   Follow Up Recommendations  Home health PT;Supervision for mobility/OOB     Equipment Recommendations  None recommended by PT    Recommendations for Other Services       Precautions / Restrictions Precautions Precautions: Fall Precaution Comments: watch O2 sats Restrictions Weight Bearing Restrictions: No    Mobility  Bed Mobility Overal bed mobility: Modified Independent             General bed mobility comments: extra time to sit up and catch her breath  Transfers Overall transfer level: Needs assistance Equipment used: None Transfers: Sit to/from Stand;Stand Pivot Transfers Sit to Stand: Supervision Stand pivot transfers: Supervision       General transfer comment: supervised with vc's  Ambulation/Gait Ambulation/Gait assistance: Min assist;Min guard Gait Distance (Feet): 100 Feet Assistive device: None Gait Pattern/deviations: Step-through pattern;Decreased stride length;Wide base of support;Trunk flexed   Gait velocity interpretation: <1.31 ft/sec, indicative of household ambulator General Gait Details: min assist once to capture LOB and otherwise for safety due to the first  incident   Stairs             Wheelchair Mobility    Modified Rankin (Stroke Patients Only)       Balance Overall balance assessment: Needs assistance Sitting-balance support: Feet supported Sitting balance-Leahy Scale: Good   Postural control: Posterior lean Standing balance support: Single extremity supported Standing balance-Leahy Scale: Fair                              Cognition Arousal/Alertness: Awake/alert Behavior During Therapy: WFL for tasks assessed/performed Overall Cognitive Status: Within Functional Limits for tasks assessed                                        Exercises Other Exercises Other Exercises: rom to ankles    General Comments General comments (skin integrity, edema, etc.): on 4L O2 due to SOB and earlier struggle to get comfortable with breathing      Pertinent Vitals/Pain Pain Assessment: No/denies pain    Home Living                      Prior Function            PT Goals (current goals can now be found in the care plan section) Acute Rehab PT Goals Patient Stated Goal: breathe better PT Goal Formulation: With patient Progress towards PT goals: Progressing toward goals    Frequency    Min 3X/week      PT Plan Current  plan remains appropriate    Co-evaluation              AM-PAC PT "6 Clicks" Mobility   Outcome Measure  Help needed turning from your back to your side while in a flat bed without using bedrails?: None Help needed moving from lying on your back to sitting on the side of a flat bed without using bedrails?: None Help needed moving to and from a bed to a chair (including a wheelchair)?: A Little Help needed standing up from a chair using your arms (e.g., wheelchair or bedside chair)?: A Little Help needed to walk in hospital room?: A Little Help needed climbing 3-5 steps with a railing? : Total 6 Click Score: 18    End of Session Equipment Utilized  During Treatment: Gait belt;Oxygen Activity Tolerance: Patient tolerated treatment well;Patient limited by fatigue;Treatment limited secondary to medical complications (Comment) Patient left: in bed;with call bell/phone within reach;with nursing/sitter in room;with family/visitor present Nurse Communication: Mobility status PT Visit Diagnosis: Unsteadiness on feet (R26.81);Muscle weakness (generalized) (M62.81)     Time: [id number] PT Time Calculation (min) (ACUTE ONLY): 34 min  Charges:  $Gait Training: 8-22 mins $Therapeutic Activity: 8-22 mins                    Ramond Dial 08/16/2019, 4:56 PM   Mee Hives, PT MS Acute Rehab Dept. Number: [place] [facility] [place]

## 2019-08-16 NOTE — Progress Notes (Signed)
Patient family member advised of visiting policy.  Stated he was unaware and will not be leaving.  Patient stated family member had been staying over night previously.

## 2019-08-17 LAB — BLOOD GAS, ARTERIAL
Acid-Base Excess: 12.5 mmol/L — ABNORMAL HIGH (ref 0.0–2.0)
Bicarbonate: 39.2 mmol/L — ABNORMAL HIGH (ref 20.0–28.0)
Drawn by: 313941
O2 Content: 3 L/min
O2 Saturation: 96 %
Patient temperature: 37
pCO2 arterial: 82.2 mmHg (ref 32.0–48.0)
pH, Arterial: 7.299 — ABNORMAL LOW (ref 7.350–7.450)
pO2, Arterial: 89.7 mmHg (ref 83.0–108.0)

## 2019-08-17 MED ORDER — DM-GUAIFENESIN ER 30-600 MG PO TB12: 1.0000 | ORAL_TABLET | Freq: Two times a day (BID) | ORAL | Status: DC | PRN

## 2019-08-17 MED ORDER — AZITHROMYCIN 250 MG PO TABS
250.0000 mg | ORAL_TABLET | Freq: Every day | ORAL | Status: DC
  Administered 2019-08-18 – 2019-08-20 (×3): 250 mg via ORAL
  Filled 2019-08-17 (×3): qty 1

## 2019-08-17 MED ORDER — PREDNISONE 10 MG PO TABS
10.0000 mg | ORAL_TABLET | Freq: Every day | ORAL | Status: DC
  Administered 2019-08-17 – 2019-08-20 (×4): 10 mg via ORAL
  Filled 2019-08-17 (×4): qty 1

## 2019-08-17 NOTE — TOC Progression Note (Addendum)
Transition of Care [facility]) - Progression Note    Patient Details  Name: Courtney Terry MRN: [id number] Date of Birth: 09/08/1961  [facility]) CM/SW Contact  Zenon Mayo, RN Phone Number: 08/17/2019, 9:45 AM  Clinical Narrative:    NCM offered choice to patient for HHPT , NIV and neb machine, she states she would like to work with [employer] for DME and she will let me know about [facility] services . Patient states she does not have a preference of which agency, who ever can take she states.  NCM made referral to [facility] for HHPT, awaiting to hear back. [place] [facility] [place], [facility] was unable to staff for HHPT.  NCM asked patient if she would like to consider doing outpatient pt. She states yes she would like to go to [facility].  NCM made referral thru epic for outpt pt.    Expected Discharge Plan: Home/Self Care Barriers to Discharge: Continued Medical Work up  Expected Discharge Plan and Services Expected Discharge Plan: Home/Self Care                                               Social Determinants of Health (SDOH) Interventions    Readmission Risk Interventions No flowsheet data found.

## 2019-08-17 NOTE — Progress Notes (Signed)
PROGRESS NOTE    Courtney Terry  [id number] DOB: 05/04/1961 DOA: 08/12/2019 PCP: [name], MD     Brief Narrative:  Courtney Howle Tinsleyis a 58 y.o.femalewith medical history significant ofasthma, COPDand chronic hypoxic respiratory failure on 3L home oxygen, hypertension, hypothyroidism, depression, anxiety, bipolar disorder, seizures, tobacco use presenting with complaints of dyspnea.Patient has had a productive cough, shortness of breath, and wheezing for the past 1 week. She is coughing up a lot of mucus. Last night around 7 PM she became acutely worse and could hardly breathe. On arrival to the ED, patient was tachycardic, tachypneic, wheezing, and had significantly increased work of breathing. Oxygen saturation 97% on 3 L home oxygen. Initial ABG showing pH 7.19, PCO2 102, and PO2 108. Patient was placed on BiPAP and her work of breathing improved. She was given albuterol, Atrovent, magnesium 2 g, and Solu-Medrol 125 mg. Afebrile. White blood cell count 18.8. BNP normal. Chest x-ray with findings consistent with COPD and no acute cardiopulmonary disease.   New events last 24 hours / Subjective: Continues to have a cough and some congestion.   Assessment & Plan:   Principal Problem:   COPD with acute exacerbation ([place]) Active Problems:   Hypertension   Tobacco use   Acute on chronic respiratory failure with hypoxia and hypercapnia (HCC)   COPD exacerbation (HCC)   Acute on chronic hypoxemic hypercapnic respiratory failure secondary to COPD exacerbation -COVID 19 negative -BiPAP and wean to home 3L [place] O2 as able. Currently on 3L  -PCCM following -Deescalated to prednisone 20 mg twice daily (on 10mg  prednisone at baseline), nebs, Omnicef, azithromycin  Tobacco abuse -Cessation counseling -Nicotine patch  Hypothyroidism -Synthroid  Anxiety -Klonopin, Prozac   Seizure disorder -Depakote   DVT prophylaxis: Lovenox Code Status: DNR Family  Communication: None at bedside Disposition Plan: Pending clinical improvement, breathing not back to baseline yet.  Plan to DC with home health on discharge   Consultants:   PCCM  Procedures:   None   Antimicrobials:  Anti-infectives (From admission, onward)   Start     Dose/Rate Route Frequency Ordered Stop   08/18/19 1000  azithromycin (ZITHROMAX) tablet 250 mg     250 mg Oral Daily 08/17/19 1010     08/14/19 1115  azithromycin (ZITHROMAX) tablet 250 mg     250 mg Oral Daily 08/14/19 1111 08/17/19 0907   08/14/19 1115  cefdinir (OMNICEF) capsule 300 mg     300 mg Oral Every 12 hours 08/14/19 1111 08/20/19 0959   08/13/19 0900  azithromycin (ZITHROMAX) 500 mg in sodium chloride 0.9 % 250 mL IVPB  Status:  Discontinued     500 mg 250 mL/hr over 60 Minutes Intravenous Every 24 hours 08/13/19 0844 08/14/19 1111   08/13/19 0130  cefTRIAXone (ROCEPHIN) 1 g in sodium chloride 0.9 % 100 mL IVPB  Status:  Discontinued     1 g 200 mL/hr over 30 Minutes Intravenous Daily at bedtime 08/13/19 0114 08/14/19 1111       Objective: Vitals:   08/17/19 0715 08/17/19 0716 08/17/19 0717 08/17/19 0804  BP:    128/69  Pulse:    92  Resp:    20  Temp:    98.9 F (37.2 C)  TempSrc:    Oral  SpO2: 99% 99% 99% 97%  Weight:      Height:        Intake/Output Summary (Last 24 hours) at 08/17/2019 1048 Last data filed at 08/17/2019 1022 Gross per 24 hour  Intake 942 ml  Output 1400 ml  Net -458 ml   Filed Weights   08/15/19 0558 08/16/19 0604 08/17/19 0513  Weight: 66.5 kg 66.1 kg 66.6 kg    Examination: General exam: Appears calm and comfortable  Respiratory system: Decreased breath sounds without increase in respiratory effort Cardiovascular system: S1 & S2 heard, RRR. No pedal edema. Gastrointestinal system: Abdomen is nondistended, soft and nontender. Normal bowel sounds heard. Central nervous system: Alert and oriented. Non focal exam. Speech clear  Extremities: Symmetric in  appearance bilaterally  Skin: No rashes, lesions or ulcers on exposed skin  Psychiatry: Judgement and insight appear stable. Mood & affect appropriate.    Data Reviewed: I have personally reviewed following labs and imaging studies  CBC: Recent Labs  Lab 08/12/19 2106 08/13/19 0500 08/14/19 0404 08/15/19 0413 08/16/19 0455  WBC 18.8* 17.0* 13.8* 15.1* 12.5*  HGB 13.5 12.9 11.3* 11.8* 12.8  HCT 45.8 45.2 39.7 41.4 45.1  MCV 95.6 97.2 96.6 97.2 96.2  PLT 284 282 251 266 [id number]   Basic Metabolic Panel: Recent Labs  Lab 08/12/19 2106 08/14/19 0404  NA 140 139  K 4.5 5.1  CL 97* 95*  CO2 31 36*  GLUCOSE 134* 123*  BUN 10 15  CREATININE 0.48 0.43*  CALCIUM 9.3 9.2   GFR: Estimated Creatinine Clearance: 81.1 mL/min (A) (by C-G formula based on SCr of 0.43 mg/dL (L)). Liver Function Tests: No results for input(s): AST, ALT, ALKPHOS, BILITOT, PROT, ALBUMIN in the last 168 hours. No results for input(s): LIPASE, AMYLASE in the last 168 hours. No results for input(s): AMMONIA in the last 168 hours. Coagulation Profile: No results for input(s): INR, PROTIME in the last 168 hours. Cardiac Enzymes: No results for input(s): CKTOTAL, CKMB, CKMBINDEX, TROPONINI in the last 168 hours. BNP (last 3 results) No results for input(s): PROBNP in the last 8760 hours. HbA1C: No results for input(s): HGBA1C in the last 72 hours. CBG: Recent Labs  Lab 08/14/19 1546  GLUCAP 136*   Lipid Profile: No results for input(s): CHOL, HDL, LDLCALC, TRIG, CHOLHDL, LDLDIRECT in the last 72 hours. Thyroid Function Tests: No results for input(s): TSH, T4TOTAL, FREET4, T3FREE, THYROIDAB in the last 72 hours. Anemia Panel: No results for input(s): VITAMINB12, FOLATE, FERRITIN, TIBC, IRON, RETICCTPCT in the last 72 hours. Sepsis Labs: No results for input(s): PROCALCITON, LATICACIDVEN in the last 168 hours.  Recent Results (from the past 240 hour(s))  SARS CORONAVIRUS 2 (TAT 6-24 HRS) Nasopharyngeal  Nasopharyngeal Swab     Status: None   Collection Time: 08/12/19  9:25 PM   Specimen: Nasopharyngeal Swab  Result Value Ref Range Status   SARS Coronavirus 2 NEGATIVE NEGATIVE Final    Comment: (NOTE) SARS-CoV-2 target nucleic acids are NOT DETECTED. The SARS-CoV-2 RNA is generally detectable in upper and lower respiratory specimens during the acute phase of infection. Negative results do not preclude SARS-CoV-2 infection, do not rule out co-infections with other pathogens, and should not be used as the sole basis for treatment or other patient management decisions. Negative results must be combined with clinical observations, patient history, and epidemiological information. The expected result is Negative. Fact Sheet for Patients: SugarRoll.be Fact Sheet for Healthcare Providers: https://www.woods-mathews.com/ This test is not yet approved or cleared by the [place] FDA and  has been authorized for detection and/or diagnosis of SARS-CoV-2 by FDA under an Emergency Use Authorization (EUA). This EUA will remain  in effect (meaning this test can be used) for the duration  of the COVID-19 declaration under Section 56 4(b)(1) of the Act, 21 U.S.C. section 360bbb-3(b)(1), unless the authorization is terminated or revoked sooner. Performed at [facility], [place] [address] [place], [place] [address]       Radiology Studies: No results found.    Scheduled Meds: . anastrozole  1 mg Oral Daily  . arformoterol  15 mcg Nebulization BID  . [START ON 08/18/2019] azithromycin  250 mg Oral Daily  . budesonide (PULMICORT) nebulizer solution  2 mg Nebulization BID  . cefdinir  300 mg Oral Q12H  . divalproex  500 mg Oral Daily  . enoxaparin (LOVENOX) injection  40 mg Subcutaneous Daily  . feeding supplement (ENSURE ENLIVE)  237 mL Oral BID BM  . folic acid  1 mg Oral Daily  . ipratropium-albuterol  3 mL Nebulization TID  . levothyroxine   75 mcg Oral Q0600  . multivitamin with minerals  1 tablet Oral Daily  . nicotine  14 mg Transdermal Daily  . pantoprazole  40 mg Oral Daily  . predniSONE  10 mg Oral Q breakfast   Continuous Infusions:    LOS: 5 days      Time spent: 25 minutes   Dessa Phi, DO [facility] 08/17/2019, 10:48 AM   Available via Epic secure chat 7am-7pm After these hours, please refer to coverage provider listed on [email or web address]

## 2019-08-17 NOTE — TOC Transition Note (Addendum)
Transition of Care [facility]) - CM/SW Discharge Note   Patient Details  Name: Courtney Terry MRN: [id number] Date of Birth: 30-Aug-1961  Transition of Care [facility]) CM/SW Contact:  Zenon Mayo, RN Phone Number: 08/17/2019, 2:23 PM   Clinical Narrative:    Patient has decided on outpatient physical therapy, NCM asked which one which she like to go to on [address] or [place].  Patient states the one on [address]  NCM sent referral for outpatient pt to outpatient rehab on [address]  Patient will also need a nebulizer machine and NIV.  NCM has forms for Whitney with PCCM to sign , Zack with [employer] is awaiting the ABG lab result for today to make sure her [place] levels qualify also.  Patient states she has transport at discharge,she has no issues with getting medications.  Zack with [employer] state they brought the neb machine to patient room and she told them she has one at home already.  She will only need NIV set up.  Zack with [employer] will check tomorrow. Orders and narrative left on chart for Whitney NP to sign and put narrative in her note.   Final next level of care: Home/Self Care Barriers to Discharge: No Barriers Identified   Patient Goals and CMS Choice Patient states their goals for this hospitalization and ongoing recovery are:: get better CMS Medicare.gov Compare Post Acute Care list provided to:: Patient Choice offered to / list presented to : Patient  Discharge Placement                       Discharge Plan and Services                DME Arranged: (NA)         HH Arranged: NA          Social Determinants of Health (SDOH) Interventions     Readmission Risk Interventions No flowsheet data found.
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
## 2019-08-17 NOTE — Progress Notes (Signed)
Pt placed on BiPAP by respiratory at 2135, after ABG on 3L [place] obtained. Pt now refusing BiPAP (22:30) and states that she will put it back on later tonight. Pt placed back on 3L [place] after pt was encouraged to let staff put BiPAP back on her. CO2 = 82.2 tonight. Will continue to monitor.

## 2019-08-18 ENCOUNTER — Inpatient Hospital Stay (HOSPITAL_COMMUNITY): Payer: Medicaid Other

## 2019-08-18 LAB — CBC
HCT: 40.9 % (ref 36.0–46.0)
Hemoglobin: 12 g/dL (ref 12.0–15.0)
MCH: 27.5 pg (ref 26.0–34.0)
MCHC: 29.3 g/dL — ABNORMAL LOW (ref 30.0–36.0)
MCV: 93.8 fL (ref 80.0–100.0)
Platelets: 246 10*3/uL (ref 150–400)
RBC: 4.36 MIL/uL (ref 3.87–5.11)
RDW: 13.9 % (ref 11.5–15.5)
WBC: 11.7 10*3/uL — ABNORMAL HIGH (ref 4.0–10.5)
nRBC: 0 % (ref 0.0–0.2)

## 2019-08-18 LAB — BLOOD GAS, ARTERIAL
Acid-Base Excess: 13.8 mmol/L — ABNORMAL HIGH (ref 0.0–2.0)
Bicarbonate: 39.9 mmol/L — ABNORMAL HIGH (ref 20.0–28.0)
Drawn by: 39899
FIO2: 32
O2 Saturation: 96 %
Patient temperature: 36.8
pCO2 arterial: 73 mmHg (ref 32.0–48.0)
pH, Arterial: 7.355 (ref 7.350–7.450)
pO2, Arterial: 82.7 mmHg — ABNORMAL LOW (ref 83.0–108.0)

## 2019-08-18 MED ORDER — IPRATROPIUM-ALBUTEROL 0.5-2.5 (3) MG/3ML IN SOLN
3.0000 mL | Freq: Two times a day (BID) | RESPIRATORY_TRACT | Status: DC
  Administered 2019-08-18 – 2019-08-20 (×4): 3 mL via RESPIRATORY_TRACT
  Filled 2019-08-18 (×4): qty 3

## 2019-08-18 MED ORDER — GUAIFENESIN ER 600 MG PO TB12
600.0000 mg | ORAL_TABLET | Freq: Two times a day (BID) | ORAL | Status: DC
  Administered 2019-08-18 – 2019-08-20 (×5): 600 mg via ORAL
  Filled 2019-08-18 (×5): qty 1

## 2019-08-18 NOTE — Progress Notes (Signed)
[name]  PROGRESS NOTE    JENNIEFER Terry  [id number] DOB: [birth year] DOA: 08/12/2019 PCP: [name], MD   Brief Narrative:   Courtney Stuckwisch Tinsleyis a 58 y.o.femalewith medical history significant ofasthma, COPDand chronic hypoxic respiratory failure on3Lhome oxygen, hypertension, hypothyroidism, depression, anxiety, bipolar disorder, seizures, tobacco use presenting with complaints of dyspnea.Patient has had a productive cough, shortness of breath, and wheezing for the past 1 week. She is coughing up a lot of mucus. Last night around 7 PM she became acutely worse and could hardly breathe. On arrival to the ED, patient was tachycardic, tachypneic, wheezing, and had significantly increased work of breathing. Oxygen saturation 97% on 3 L home oxygen. Initial ABG showing pH 7.19, PCO2 102, and PO2 108. Patient was placed on BiPAP and her work of breathing improved. She was given albuterol, Atrovent, magnesium 2 g, and Solu-Medrol 125 mg. Afebrile. White blood cell count 18.8. BNP normal. Chest x-ray with findings consistent with COPD and no acute cardiopulmonary disease.  11/11: Says she is still not at baseline. Still a good amount of coughing.    Assessment & Plan:   Principal Problem:   COPD with acute exacerbation ([place]) Active Problems:   Hypertension   Tobacco use   Acute on chronic respiratory failure with hypoxia and hypercapnia (HCC)   COPD exacerbation ([facility])   Acute on chronic hypoxemic hypercapnic respiratory failure secondary to COPD exacerbation     - COVID 19 negative     - Continue 3L [place] (baseline); Bipap at night; still does not feel at baseline this AM; check repeat ABG.     - PCCM following     - continue omincef through 11/13; zithro through 11/13; prednisone 10mg  (home dose); brovana, pulmicort, duonebs; schedule guaifenesin for cough; PRN tussionex, proventil     - continue IS (educated on use this morning)  Tobacco abuse     - encouraged  cessation     - Nicotine patch  Hypothyroidism     - continue lyvothyroxine 37mcg  Anxiety     - continue PRN klonopin   Seizure disorder     - continue depakote  Says she still doesn't feel at baseline. C/o continued cough and still has conversational dyspnea. Repeat ABG. Add scheduled guaifenesin. Monitor.   DVT prophylaxis: lovenox Code Status: DNR Family Communication: With family at bedside.   Disposition Plan: Home with home health when stable  Consultants:   PCCM  Antimicrobials:   Omnicef, azithromycin   ROS:  Reports dyspnea, cough. Denies CP, N, V, ab pain. Remainder 10-pt ROS is negative for all not previously mentioned.  Subjective: "The cough is still bad."  Objective: Vitals:   08/18/19 0824 08/18/19 0907 08/18/19 0909 08/18/19 0912  BP: (!) 135/94     Pulse: 98 96    Resp:  18    Temp:      TempSrc:      SpO2: 98% 97% 97% 97%  Weight:      Height:        Intake/Output Summary (Last 24 hours) at 08/18/2019 0956 Last data filed at 08/18/2019 0830 Gross per 24 hour  Intake 1440 ml  Output 750 ml  Net 690 ml   Filed Weights   08/16/19 0604 08/17/19 0513 08/18/19 0034  Weight: 66.1 kg 66.6 kg 67.9 kg    Examination:  General: 58 y.o. female resting in bed in NAD Cardiovascular: tachy, +S1, S2, no m/g/r, equal pulses throughout Respiratory: exp wheeze, decreased at bases, some dyspnea (  accesory muscle use) w/ conversation   GI: BS+, NDNT, no masses noted, no organomegaly noted MSK: No e/c/c Skin: No rashes, bruises, ulcerations noted Neuro: A&O x 3, no focal deficits Psyc: Appropriate interaction and affect, calm/cooperative   Data Reviewed: I have personally reviewed following labs and imaging studies.  CBC: Recent Labs  Lab 08/13/19 0500 08/14/19 0404 08/15/19 0413 08/16/19 0455 08/18/19 0333  WBC 17.0* 13.8* 15.1* 12.5* 11.7*  HGB 12.9 11.3* 11.8* 12.8 12.0  HCT 45.2 39.7 41.4 45.1 40.9  MCV 97.2 96.6 97.2 96.2 93.8    PLT 282 251 266 266 0000000   Basic Metabolic Panel: Recent Labs  Lab 08/12/19 2106 08/14/19 0404  NA 140 139  K 4.5 5.1  CL 97* 95*  CO2 31 36*  GLUCOSE 134* 123*  BUN 10 15  CREATININE 0.48 0.43*  CALCIUM 9.3 9.2   GFR: Estimated Creatinine Clearance: 81.1 mL/min (A) (by C-G formula based on SCr of 0.43 mg/dL (L)). Liver Function Tests: No results for input(s): AST, ALT, ALKPHOS, BILITOT, PROT, ALBUMIN in the last 168 hours. No results for input(s): LIPASE, AMYLASE in the last 168 hours. No results for input(s): AMMONIA in the last 168 hours. Coagulation Profile: No results for input(s): INR, PROTIME in the last 168 hours. Cardiac Enzymes: No results for input(s): CKTOTAL, CKMB, CKMBINDEX, TROPONINI in the last 168 hours. BNP (last 3 results) No results for input(s): PROBNP in the last 8760 hours. HbA1C: No results for input(s): HGBA1C in the last 72 hours. CBG: Recent Labs  Lab 08/14/19 1546  GLUCAP 136*   Lipid Profile: No results for input(s): CHOL, HDL, LDLCALC, TRIG, CHOLHDL, LDLDIRECT in the last 72 hours. Thyroid Function Tests: No results for input(s): TSH, T4TOTAL, FREET4, T3FREE, THYROIDAB in the last 72 hours. Anemia Panel: No results for input(s): VITAMINB12, FOLATE, FERRITIN, TIBC, IRON, RETICCTPCT in the last 72 hours. Sepsis Labs: No results for input(s): PROCALCITON, LATICACIDVEN in the last 168 hours.  Recent Results (from the past 240 hour(s))  SARS CORONAVIRUS 2 (TAT 6-24 HRS) Nasopharyngeal Nasopharyngeal Swab     Status: None   Collection Time: 08/12/19  9:25 PM   Specimen: Nasopharyngeal Swab  Result Value Ref Range Status   SARS Coronavirus 2 NEGATIVE NEGATIVE Final    Comment: (NOTE) SARS-CoV-2 target nucleic acids are NOT DETECTED. The SARS-CoV-2 RNA is generally detectable in upper and lower respiratory specimens during the acute phase of infection. Negative results do not preclude SARS-CoV-2 infection, do not rule out co-infections  with other pathogens, and should not be used as the sole basis for treatment or other patient management decisions. Negative results must be combined with clinical observations, patient history, and epidemiological information. The expected result is Negative. Fact Sheet for Patients: SugarRoll.be Fact Sheet for Healthcare Providers: https://www.woods-mathews.com/ This test is not yet approved or cleared by the [place] FDA and  has been authorized for detection and/or diagnosis of SARS-CoV-2 by FDA under an Emergency Use Authorization (EUA). This EUA will remain  in effect (meaning this test can be used) for the duration of the COVID-19 declaration under Section 56 4(b)(1) of the Act, 21 U.S.C. section 360bbb-3(b)(1), unless the authorization is terminated or revoked sooner. Performed at [facility], [place] [address] [place], [place] [address]       Radiology Studies: No results found.   Scheduled Meds:  anastrozole  1 mg Oral Daily   arformoterol  15 mcg Nebulization BID   azithromycin  250 mg Oral Daily  budesonide (PULMICORT) nebulizer solution  2 mg Nebulization BID   cefdinir  300 mg Oral Q12H   divalproex  500 mg Oral Daily   enoxaparin (LOVENOX) injection  40 mg Subcutaneous Daily   feeding supplement (ENSURE ENLIVE)  237 mL Oral BID BM   folic acid  1 mg Oral Daily   ipratropium-albuterol  3 mL Nebulization TID   levothyroxine  75 mcg Oral Q0600   multivitamin with minerals  1 tablet Oral Daily   nicotine  14 mg Transdermal Daily   pantoprazole  40 mg Oral Daily   predniSONE  10 mg Oral Q breakfast   Continuous Infusions:   LOS: 6 days    Time spent: 25 minutes spent in the coordination of care today.    Jonnie Finner, DO [facility] Pager [phone]  If 7PM-7AM, please contact night-coverage [email or web address] Password [facility] 08/18/2019, 9:56 AM

## 2019-08-18 NOTE — Progress Notes (Signed)
Pt was convinced to use BiPAP again at 2am, however pt had just eaten. RN placed pt back on BiPAP at 02:30am. Will continue to monitor.

## 2019-08-18 NOTE — Progress Notes (Signed)
Pt wanted BiPAP off at 5:15am. BiPAP removed. Pt placed back on 3L [place]. Will continue to monitor.  Pt wore BiPAP for almost 4 hours last night.

## 2019-08-18 NOTE — Progress Notes (Signed)
Received call from [facility] NP [facility] requesting that patient receive a PRN Albuterol & be placed on BIPAP due to increased work of breathing. Upon RT arrival patient agreed to PRN neb but refuses to go on BIPAP at this time. Patient states "I need to eat first, they haven't brought my supper" Patient does not appear to be in any respiratory distress at this time & she denies shortness of breath. Patient is alert & oriented. BIPAP is at the bedside on standby. Patient is aware to call RT for BIPAP.

## 2019-08-18 NOTE — Progress Notes (Signed)
Nutrition Follow-up  DOCUMENTATION CODES:   Not applicable  INTERVENTION:   -Continue Ensure Enlive po BID, each supplement provides 350 kcal and 20 grams of protein -Continue MVI with minerals daily  NUTRITION DIAGNOSIS:   Increased nutrient needs related to catabolic illness(COPD, chronic hypoxic respiratory failure on 3L O2 at baseline) as evidenced by estimated needs.  Ongoing  GOAL:   Patient will meet greater than or equal to 90% of their needs  Progressing   MONITOR:   PO intake, Labs, I & O's, Supplement acceptance, Weight trends  REASON FOR ASSESSMENT:   Consult Assessment of nutrition requirement/status  ASSESSMENT:   58 year old female with past medical history significant for asthma, COPD, chronic hypoxic respiratory failure on 3L home O2, HTN, hypothyroidism, depression/anxiety, bipolar disorder, seizures who presents to ED with complaints of productive cough, SOB, and wheezing over the past week.  Reviewed I/O's: -100 ml x 24 hours and -138 ml since admission  UOP: 1.3 L x 24 hours  Spoke with pt at bedside, who reports feeling better. She states her appetite is improving "due to the medicine they have been giving me". Pt shares she has been eating most of her meals while hospitalized. She consumed 100% of spaghetti lunch today; noted documented meal completion 25-95%.   Pt reports decreased appetite PTA, however, still consumed 3 meals per day. Meals are prepared by her brother and boyfriend and consist of "a lot of heavy soul food, like mac and cheese, collards, pigs feet, and rice". Breakfast usually consists of eggs, breakfast meat, eggs, and toast. Pt shares that she was not eating less, but forced herself to eat PTA.   Pt reports weight gain. She also reports weakness and feeling unsteady on her feet, however, has been getting around with a walker.   Pt reports liking Ensure supplements and wanting to continue them. Discussed importance of good meal  and supplement intake to promote healing.   Pt very tearful at time of visit. RD provided empathetic listening to pt. Adjusted thermostat and assisted pt with additional blankets per her request.   Medications reviewed and include prednisone.   Labs reviewed.   NUTRITION - FOCUSED PHYSICAL EXAM:    Most Recent Value  Orbital Region  No depletion  Upper Arm Region  No depletion  Thoracic and Lumbar Region  No depletion  Buccal Region  No depletion  Temple Region  No depletion  Clavicle Bone Region  No depletion  Clavicle and Acromion Bone Region  No depletion  Scapular Bone Region  No depletion  Dorsal Hand  Mild depletion  Patellar Region  Mild depletion  Anterior Thigh Region  Mild depletion  Posterior Calf Region  Mild depletion  Edema (RD Assessment)  Mild  Hair  Reviewed  Eyes  Reviewed  Mouth  Reviewed  Skin  Reviewed  Nails  Reviewed       Diet Order:   Diet Order            Diet regular Room service appropriate? Yes; Fluid consistency: Thin  Diet effective now              EDUCATION NEEDS:   Not appropriate for education at this time  Skin:  Skin Assessment: Reviewed RN Assessment  Last BM:  PTA  Height:   Ht Readings from Last 1 Encounters:  08/13/19 _0  (1.753 m)    Weight:   Wt Readings from Last 1 Encounters:  08/18/19 67.9 kg    Ideal Body  Weight:  63.6 kg  BMI:  Body mass index is 22.09 kg/m.  Estimated Nutritional Needs:   Kcal:  2000-2200  Protein:  100-110  Fluid:  >/= 2 L/day    Faigy Stretch A. Jimmye Norman, RD, LDN, [place] Registered Dietitian II Certified Diabetes Care and Education Specialist Pager: [phone] After hours Pager: [phone]
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
## 2019-08-18 NOTE — Progress Notes (Signed)
Physical Therapy Treatment Patient Details Name: Courtney Terry MRN: [id number] DOB: 1961-06-27 Today's Date: 08/18/2019    History of Present Illness 58 y.o. female s/p COPDe with medical history significant of asthma, COPD and chronic hypoxic respiratory failure on 3L home oxygen, hypertension, hypothyroidism, depression, anxiety, bipolar disorder, seizures, tobacco use.    PT Comments    Patient seen for mobility progression. Pt requires supervision/min guard for OOB mobility. Pt ambulated total distance of 160 ft with standing rest break required due to 3/4 DOE. SpO2 desat to 87% on 3L O2 via [place] and pt on 4L for recovery and for gait distance back to room. After seated rest end of session SpO2 up to 96% on 3L O2. Pt may benefit from use of rollator for energy conservation next session. Continue to progress as tolerated.     Follow Up Recommendations  Home health PT;Supervision for mobility/OOB     Equipment Recommendations  None recommended by PT    Recommendations for Other Services       Precautions / Restrictions Precautions Precautions: Fall Precaution Comments: watch O2 sats Restrictions Weight Bearing Restrictions: No    Mobility  Bed Mobility Overal bed mobility: Independent                Transfers Overall transfer level: Needs assistance Equipment used: None Transfers: Sit to/from Stand Sit to Stand: Supervision         General transfer comment: supervision for safety  Ambulation/Gait Ambulation/Gait assistance: Min guard Gait Distance (Feet): (160 ft total with standing rest break due to DOE) Assistive device: None Gait Pattern/deviations: Step-through pattern;Decreased stride length;Wide base of support;Trunk flexed Gait velocity: decreased   General Gait Details: min guard for safety; 3/4 DOE with mobility and SpO2 desat to 87% on 3L O2 via [place]   Stairs             Wheelchair Mobility    Modified Rankin (Stroke Patients  Only)       Balance Overall balance assessment: Needs assistance Sitting-balance support: Feet supported Sitting balance-Leahy Scale: Good       Standing balance-Leahy Scale: Fair                              Cognition Arousal/Alertness: Awake/alert Behavior During Therapy: WFL for tasks assessed/performed Overall Cognitive Status: Within Functional Limits for tasks assessed                                        Exercises      General Comments        Pertinent Vitals/Pain Pain Assessment: No/denies pain    Home Living                      Prior Function            PT Goals (current goals can now be found in the care plan section) Progress towards PT goals: Progressing toward goals    Frequency    Min 3X/week      PT Plan Current plan remains appropriate    Co-evaluation              AM-PAC PT "6 Clicks" Mobility   Outcome Measure  Help needed turning from your back to your side while in a flat bed without using bedrails?: None Help needed moving  from lying on your back to sitting on the side of a flat bed without using bedrails?: None Help needed moving to and from a bed to a chair (including a wheelchair)?: A Little Help needed standing up from a chair using your arms (e.g., wheelchair or bedside chair)?: A Little Help needed to walk in hospital room?: A Little Help needed climbing 3-5 steps with a railing? : Total 6 Click Score: 18    End of Session Equipment Utilized During Treatment: Gait belt;Oxygen Activity Tolerance: Patient tolerated treatment well;Other (comment)(DOE with mobility) Patient left: in bed;with call bell/phone within reach Nurse Communication: Mobility status PT Visit Diagnosis: Unsteadiness on feet (R26.81);Muscle weakness (generalized) (M62.81)     Time: 1109-1130 PT Time Calculation (min) (ACUTE ONLY): 21 min  Charges:  $Gait Training: 8-22 mins                     Earney Navy, PTA Acute Rehabilitation Services Pager: [phone] Office: [phone]     Darliss Cheney 08/18/2019, 11:58 AM

## 2019-08-19 DIAGNOSIS — J441 Chronic obstructive pulmonary disease with (acute) exacerbation: Principal | ICD-10-CM

## 2019-08-19 LAB — CBC WITH DIFFERENTIAL/PLATELET
Abs Immature Granulocytes: 0.07 K/uL (ref 0.00–0.07)
Basophils Absolute: 0 K/uL (ref 0.0–0.1)
Basophils Relative: 0 %
Eosinophils Absolute: 0.1 K/uL (ref 0.0–0.5)
Eosinophils Relative: 0 %
HCT: 42.4 % (ref 36.0–46.0)
Hemoglobin: 12.6 g/dL (ref 12.0–15.0)
Immature Granulocytes: 1 %
Lymphocytes Relative: 7 %
Lymphs Abs: 1 K/uL (ref 0.7–4.0)
MCH: 27.9 pg (ref 26.0–34.0)
MCHC: 29.7 g/dL — ABNORMAL LOW (ref 30.0–36.0)
MCV: 94 fL (ref 80.0–100.0)
Monocytes Absolute: 0.6 K/uL (ref 0.1–1.0)
Monocytes Relative: 4 %
Neutro Abs: 12.6 K/uL — ABNORMAL HIGH (ref 1.7–7.7)
Neutrophils Relative %: 88 %
Platelets: 274 K/uL (ref 150–400)
RBC: 4.51 MIL/uL (ref 3.87–5.11)
RDW: 13.9 % (ref 11.5–15.5)
WBC: 14.4 K/uL — ABNORMAL HIGH (ref 4.0–10.5)
nRBC: 0 % (ref 0.0–0.2)

## 2019-08-19 LAB — RENAL FUNCTION PANEL
Albumin: 3.2 g/dL — ABNORMAL LOW (ref 3.5–5.0)
Anion gap: 10 (ref 5–15)
BUN: 11 mg/dL (ref 6–20)
CO2: 34 mmol/L — ABNORMAL HIGH (ref 22–32)
Calcium: 9.3 mg/dL (ref 8.9–10.3)
Chloride: 93 mmol/L — ABNORMAL LOW (ref 98–111)
Creatinine, Ser: 0.5 mg/dL (ref 0.44–1.00)
GFR calc Af Amer: >60 mL/min
GFR calc non Af Amer: >60 mL/min
Glucose, Bld: 134 mg/dL — ABNORMAL HIGH (ref 70–99)
Phosphorus: 2.4 mg/dL — ABNORMAL LOW (ref 2.5–4.6)
Potassium: 4.5 mmol/L (ref 3.5–5.1)
Sodium: 137 mmol/L (ref 135–145)

## 2019-08-19 LAB — MAGNESIUM: Magnesium: 1.9 mg/dL (ref 1.7–2.4)

## 2019-08-19 NOTE — Progress Notes (Signed)
NAME:  Courtney Terry, MRN:  [id number], DOB:  [birth year], LOS: 7 ADMISSION DATE:  08/12/2019, CONSULTATION DATE: 08/13/2019 REFERRING MD: Dr. Maylene Roes, CHIEF COMPLAINT: Acute hypercarbic respiratory failure  Brief History   58 year old female with history of polysubstance abuse and 40 pack years of smoking presenting with a COPD exacerbation requiring BiPAP.  History of present illness   58 year old female with history of polysubstance abuse and 40 pack years of smoking presenting with a COPD exacerbation requiring BiPAP.  Patient is on BiPAP currently and history if minimal. Patient reports increased cough with sputum production that is clear, no fever or sick contacts. Continues to smoke and uses O2 at home but unsure of dosing and chronically on 10 mg of PO prednisone per day.  Reports increased wheezing.  No orthopnea or PND.  Past Medical History  Prednisone dependent COPD Active smoker Seizures PTSD Hypothyroidism Hypertension Depression Asthma  Anemia Drug use in the past  Significant Hospital Events   Admission for hypercapnic respiratory failure on 11/6-required BiPAP  Consults:  PCCM  Procedures:  None  Significant Diagnostic Tests:  Chest x-ray reviewed 11/5 >> showing hyperinflation CT scan 11/2018 >> showing lung nodules, hyperinflation,  Micro Data:  COVID-19 11/5 >>negative RVP 11/6 >>   Antimicrobials:  Rocephin 11/6 >> 11/7 Zithromax 11/6 >> Cefdinir 11/7 >>  Interim history/subjective:  States "feel a lot better". No acute events.   Objective   Blood pressure 112/65, pulse (!) 101, temperature 98.4 F (36.9 C), temperature source Oral, resp. rate 20, height 5\' 9"  (1.753 m), weight 67.4 kg, SpO2 97 %.        Intake/Output Summary (Last 24 hours) at 08/19/2019 1047 Last data filed at 08/19/2019 0829 Gross per 24 hour  Intake 840 ml  Output 1750 ml  Net -910 ml   Filed Weights   08/17/19 0513 08/18/19 0034 08/19/19 0424  Weight: 66.6 kg  67.9 kg 67.4 kg    Examination: General:  Chronically ill appearing older female sitting on edge of bed and talking on the phone HEENT: MM pink/moist, EOMI Neuro: A&O x 3, no deficits CV: RRR, no M/R/G PULM:  Normal effort.  Faint wheezing in apices GI: soft, active  Extremities: warm/dry, no edema  Skin: no rashes  Assessment & Plan:   Acute on chronic hypoxic and hypercapenic respiratory failure Very Severe obstructive lung disease Acute COPD exacerbation (hx of underlying very advanced obstructive disease on chronic oxygen supplementation).  Of note, she has been hospitalized for COPD related issues four times in the last 12 months P: Continue Duonebs, Brovana, and Pulmicort nebs Ongoing pulmonary hygiene, IS, mucinex Continue chronic Azithromycin 250mg  daily, this should be continued at discharge and indefinitely given her lung function Continue home 10mg  prednisone, some question for a component of adrenal insufficiency Continue BiPAP nightly and PRN  ABG overnight prior to going on BiPAP confirms resting chronic hypercapnia and need for home NIV (See below for home qualification of home NIV, Paperwork completed) Outpatient Pulmonary follow up scheduled 11/23 at 11 am with Rexene Edison, NP Patient would benefit from palliative care consult    Patient continues to exhibit signs of hypercapnia associated with chronic respiratory failure secondary to severe COPD. Patient requires the use of NIV both QHS and daytime to help with exacerbation periods. The use of the NIV will treat patient's high PCO2 levels and can reduce risk of exacerbation and future hospitalizations when used at night and during the day. Patient will need  these advanced setting in the conjunction with the current medication regiment; BIPAP is not an option due to its functional limitations and the severity of the patient's condition. Failure to have NIV available for the use over 24 hours period could lead to death.     Tobacco abuse Polysubstance abuse  - hx of polysubstance abuse (drug free since 11/2018) P: Nicotine patch  Cessation counseling  Rest per primary team.   Best practice:  Diet: Reg diet  Pain/Anxiety/Delirium protocol (if indicated): Per primary VAP protocol (if indicated): Not indicated DVT prophylaxis: Lovenox GI prophylaxis: Protonix Mobility: As tolerated Code Status: DNR Disposition: Medical floor   Montey Hora, [facility] 08/19/2019, 10:51 AM

## 2019-08-19 NOTE — Plan of Care (Signed)
  Problem: Respiratory: Goal: Ability to maintain a clear airway will improve Outcome: Progressing   Problem: Cardiac: Goal: Ability to achieve and maintain adequate cardiopulmonary perfusion will improve Outcome: Progressing   Problem: Clinical Measurements: Goal: Ability to maintain clinical measurements within normal limits will improve Outcome: Progressing

## 2019-08-19 NOTE — Progress Notes (Signed)
Physical Therapy Treatment Patient Details Name: Courtney Terry MRN: [id number] DOB: 08/03/1961 Today's Date: 08/19/2019    History of Present Illness 58 y.o. female s/p COPDe with medical history significant of asthma, COPD and chronic hypoxic respiratory failure on 3L home oxygen, hypertension, hypothyroidism, depression, anxiety, bipolar disorder, seizures, tobacco use.    PT Comments    Patient seen for mobility progression. This session focused on gait training with use of rollator. Pt demonstrates improved tolerance to activity with use of AD. Pt does continue to present with DOE with longer distance gait however tolerated 120 ft X 2 trials with seated rest break on rollator. Pt will continue to benefit from further skilled PT services to maximize independence and safety with mobility.    Follow Up Recommendations  Home health PT;Supervision for mobility/OOB     Equipment Recommendations  Other (comment)(rollator )    Recommendations for Other Services       Precautions / Restrictions Precautions Precautions: Fall Precaution Comments: watch O2 sats Restrictions Weight Bearing Restrictions: No    Mobility  Bed Mobility Overal bed mobility: Independent                Transfers Overall transfer level: Modified independent Equipment used: None Transfers: Sit to/from Stand           General transfer comment: increased time/effort; no physical assist needed  Ambulation/Gait Ambulation/Gait assistance: Supervision;Min guard Gait Distance (Feet): (120 ft X 2 trials with seated rest break) Assistive device: (rollator) Gait Pattern/deviations: Step-through pattern;Decreased stride length Gait velocity: decreased   General Gait Details: cues for PLB and safe use of AD; pt with improved tolerance to activity with use of rollator; one seated rest break due to 2/4 DOE    Chief Strategy Officer    Modified Rankin (Stroke Patients  Only)       Balance Overall balance assessment: Needs assistance Sitting-balance support: Feet supported Sitting balance-Leahy Scale: Good       Standing balance-Leahy Scale: Fair                              Cognition Arousal/Alertness: Awake/alert Behavior During Therapy: WFL for tasks assessed/performed Overall Cognitive Status: Within Functional Limits for tasks assessed                                        Exercises      General Comments General comments (skin integrity, edema, etc.): pt on 3L O2 via [place] throughout session with SpO2 93% with activity      Pertinent Vitals/Pain Pain Assessment: No/denies pain    Home Living                      Prior Function            PT Goals (current goals can now be found in the care plan section) Progress towards PT goals: Progressing toward goals    Frequency    Min 3X/week      PT Plan Current plan remains appropriate    Co-evaluation              AM-PAC PT "6 Clicks" Mobility   Outcome Measure  Help needed turning from your back to your side while in a flat bed without  using bedrails?: None Help needed moving from lying on your back to sitting on the side of a flat bed without using bedrails?: None Help needed moving to and from a bed to a chair (including a wheelchair)?: None Help needed standing up from a chair using your arms (e.g., wheelchair or bedside chair)?: None Help needed to walk in hospital room?: A Little Help needed climbing 3-5 steps with a railing? : A Lot 6 Click Score: 21    End of Session Equipment Utilized During Treatment: Gait belt;Oxygen Activity Tolerance: Patient tolerated treatment well;Other (comment)(DOE with mobility) Patient left: with call bell/phone within reach;with family/visitor present;Other (comment)(pt in bathroom ) Nurse Communication: Mobility status PT Visit Diagnosis: Unsteadiness on feet (R26.81);Muscle weakness  (generalized) (M62.81)     Time: [id number] PT Time Calculation (min) (ACUTE ONLY): 29 min  Charges:  $Gait Training: 23-37 mins                     Earney Navy, PTA Acute Rehabilitation Services Pager: [phone] Office: [phone]     Darliss Cheney 08/19/2019, 4:23 PM

## 2019-08-19 NOTE — Progress Notes (Signed)
[name] Kitchen  PROGRESS NOTE    Courtney Terry  [id number] DOB: 17-Aug-1961 DOA: 08/12/2019 PCP: [name], MD   Brief Narrative:   Courtney Burgueno Tinsleyis a 58 y.o.femalewith medical history significant ofasthma, COPDand chronic hypoxic respiratory failure on3Lhome oxygen, hypertension, hypothyroidism, depression, anxiety, bipolar disorder, seizures, tobacco use presenting with complaints of dyspnea.Patient has had a productive cough, shortness of breath, and wheezing for the past 1 week. She is coughing up a lot of mucus. Last night around 7 PM she became acutely worse and could hardly breathe. On arrival to the ED, patient was tachycardic, tachypneic, wheezing, and had significantly increased work of breathing. Oxygen saturation 97% on 3 L home oxygen. Initial ABG showing pH 7.19, PCO2 102, and PO2 108. Patient was placed on BiPAP and her work of breathing improved. She was given albuterol, Atrovent, magnesium 2 g, and Solu-Medrol 125 mg. Afebrile. White blood cell count 18.8. BNP normal. Chest x-ray with findings consistent with COPD and no acute cardiopulmonary disease.  11/11: Says she is still not at baseline. Still a good amount of coughing.  11/12: No acute events ON. Breathing improved. ABG noted. Needs PFT for trelegy. Ordered.    Assessment & Plan:   Principal Problem:   COPD with acute exacerbation ([place]) Active Problems:   Hypertension   Tobacco use   Acute on chronic respiratory failure with hypoxia and hypercapnia (HCC)   COPD exacerbation (HCC)  Acute on chronic hypoxemic hypercapnic respiratory failure secondary to COPD exacerbation     - COVID 19 negative     - Continue 3L [place] (baseline); Bipap at night; still does not feel at baseline this AM; check repeat ABG.     - PCCM following     - continue omincef through 11/13; zithro chronically; prednisone 10mg  (home dose); brovana, pulmicort, duonebs; schedule guaifenesin for cough; PRN tussionex, proventil  - continue IS (educated on use this morning)     - needs PFTs for trelegy. Ordered. Appreciate pulm assistance. See their note regarding NIV.  Tobacco abuse     - encouraged cessation     - Nicotine patch  Hypothyroidism     - continue lyvothyroxine 89mcg  Anxiety     - continue PRN klonopin   Seizure disorder     - continue depakote  DVT prophylaxis: lovenox Code Status: DNR   Disposition Plan: Home with [facility]  Consultants:   PCCM  Antimicrobials:  . Omnicef, zithro   ROS:  Denies CP, dyspnea, N, V . Remainder 10-pt ROS is negative for all not previously mentioned.  Subjective: "Are they going to get a machine?"  Objective: Vitals:   08/19/19 0732 08/19/19 0733 08/19/19 0827 08/19/19 1157  BP:   112/65 103/75  Pulse:   (!) 101 (!) 103  Resp:   20 18  Temp:   98.4 F (36.9 C) 98.8 F (37.1 C)  TempSrc:   Oral Oral  SpO2: 96% 96% 97% 99%  Weight:      Height:        Intake/Output Summary (Last 24 hours) at 08/19/2019 1513 Last data filed at 08/19/2019 1159 Gross per 24 hour  Intake 600 ml  Output 1950 ml  Net -1350 ml   Filed Weights   08/17/19 0513 08/18/19 0034 08/19/19 0424  Weight: 66.6 kg 67.9 kg 67.4 kg    Examination:  General: 58 y.o. female resting in bed in NAD Cardiovascular: RRR, +S1, S2, no m/g/r, equal pulses throughout Respiratory: clear, but with low air movement,  normal WOB GI: BS+, NDNT, soft MSK: No e/c/c Neuro: A&O x 3, no focal deficits Psyc: Appropriate interaction and affect, calm/cooperative   Data Reviewed: I have personally reviewed following labs and imaging studies.  CBC: Recent Labs  Lab 08/14/19 0404 08/15/19 0413 08/16/19 0455 08/18/19 0333 08/19/19 1032  WBC 13.8* 15.1* 12.5* 11.7* 14.4*  NEUTROABS  --   --   --   --  12.6*  HGB 11.3* 11.8* 12.8 12.0 12.6  HCT 39.7 41.4 45.1 40.9 42.4  MCV 96.6 97.2 96.2 93.8 94.0  PLT 251 266 266 246 [id number]   Basic Metabolic Panel: Recent Labs  Lab 08/12/19 2106  08/14/19 0404 08/19/19 1032  NA 140 139 137  K 4.5 5.1 4.5  CL 97* 95* 93*  CO2 31 36* 34*  GLUCOSE 134* 123* 134*  BUN 10 15 11   CREATININE 0.48 0.43* 0.50  CALCIUM 9.3 9.2 9.3  MG  --   --  1.9  PHOS  --   --  2.4*   GFR: Estimated Creatinine Clearance: 81.1 mL/min (by C-G formula based on SCr of 0.5 mg/dL). Liver Function Tests: Recent Labs  Lab 08/19/19 1032  ALBUMIN 3.2*   No results for input(s): LIPASE, AMYLASE in the last 168 hours. No results for input(s): AMMONIA in the last 168 hours. Coagulation Profile: No results for input(s): INR, PROTIME in the last 168 hours. Cardiac Enzymes: No results for input(s): CKTOTAL, CKMB, CKMBINDEX, TROPONINI in the last 168 hours. BNP (last 3 results) No results for input(s): PROBNP in the last 8760 hours. HbA1C: No results for input(s): HGBA1C in the last 72 hours. CBG: Recent Labs  Lab 08/14/19 1546  GLUCAP 136*   Lipid Profile: No results for input(s): CHOL, HDL, LDLCALC, TRIG, CHOLHDL, LDLDIRECT in the last 72 hours. Thyroid Function Tests: No results for input(s): TSH, T4TOTAL, FREET4, T3FREE, THYROIDAB in the last 72 hours. Anemia Panel: No results for input(s): VITAMINB12, FOLATE, FERRITIN, TIBC, IRON, RETICCTPCT in the last 72 hours. Sepsis Labs: No results for input(s): PROCALCITON, LATICACIDVEN in the last 168 hours.  Recent Results (from the past 240 hour(s))  SARS CORONAVIRUS 2 (TAT 6-24 HRS) Nasopharyngeal Nasopharyngeal Swab     Status: None   Collection Time: 08/12/19  9:25 PM   Specimen: Nasopharyngeal Swab  Result Value Ref Range Status   SARS Coronavirus 2 NEGATIVE NEGATIVE Final    Comment: (NOTE) SARS-CoV-2 target nucleic acids are NOT DETECTED. The SARS-CoV-2 RNA is generally detectable in upper and lower respiratory specimens during the acute phase of infection. Negative results do not preclude SARS-CoV-2 infection, do not rule out co-infections with other pathogens, and should not be used as  the sole basis for treatment or other patient management decisions. Negative results must be combined with clinical observations, patient history, and epidemiological information. The expected result is Negative. Fact Sheet for Patients: SugarRoll.be Fact Sheet for Healthcare Providers: https://www.woods-mathews.com/ This test is not yet approved or cleared by the [place] FDA and  has been authorized for detection and/or diagnosis of SARS-CoV-2 by FDA under an Emergency Use Authorization (EUA). This EUA will remain  in effect (meaning this test can be used) for the duration of the COVID-19 declaration under Section 56 4(b)(1) of the Act, 21 U.S.C. section 360bbb-3(b)(1), unless the authorization is terminated or revoked sooner. Performed at [facility], [place] [address], [place], [place] [address]       Radiology Studies: Dg Chest Port 1 View  Result Date: 08/18/2019 CLINICAL  DATA:  Shortness of breath, cough, and wheezing. EXAM: PORTABLE CHEST 1 VIEW COMPARISON:  Chest x-ray dated August 12, 2019. FINDINGS: The heart size and mediastinal contours are within normal limits. Atherosclerotic calcification of the aortic arch. Normal pulmonary vascularity. The lungs remain hyperinflated with coarsened interstitial markings. No focal consolidation, pleural effusion, or pneumothorax. No acute osseous abnormality. Surgical clips in the right axilla and breast. IMPRESSION: COPD.  No active disease. Electronically Signed   By: Titus Dubin M.D.   On: 08/18/2019 10:36     Scheduled Meds: . anastrozole  1 mg Oral Daily  . arformoterol  15 mcg Nebulization BID  . azithromycin  250 mg Oral Daily  . budesonide (PULMICORT) nebulizer solution  2 mg Nebulization BID  . cefdinir  300 mg Oral Q12H  . divalproex  500 mg Oral Daily  . enoxaparin (LOVENOX) injection  40 mg Subcutaneous Daily  . feeding supplement (ENSURE ENLIVE)  237 mL Oral BID  BM  . folic acid  1 mg Oral Daily  . guaiFENesin  600 mg Oral BID  . ipratropium-albuterol  3 mL Nebulization BID  . levothyroxine  75 mcg Oral Q0600  . multivitamin with minerals  1 tablet Oral Daily  . nicotine  14 mg Transdermal Daily  . pantoprazole  40 mg Oral Daily  . predniSONE  10 mg Oral Q breakfast   Continuous Infusions:   LOS: 7 days    Time spent: 25 minutes spent in the coordination of care today.    Jonnie Finner, DO [facility] Pager [phone]  If 7PM-7AM, please contact night-coverage [email or web address] Password [email or web address] 08/19/2019, 3:13 PM

## 2019-08-19 NOTE — Progress Notes (Signed)
Pt placed on BiPAP at 22:17. Pt requested BiPAP off at 23:48. Pt refused to put BiPAP back on so far this morning. Will continue to monitor.

## 2019-08-19 NOTE — Progress Notes (Signed)
Pt off Bipap at this time.  Pt has taken Bipap off to have something to drink. Pt back on [place] and no distress noted at this time.

## 2019-08-20 MED ORDER — DM-GUAIFENESIN ER 30-600 MG PO TB12: 1.0000 | ORAL_TABLET | Freq: Two times a day (BID) | ORAL | 0 refills | Status: AC | PRN

## 2019-08-20 MED ORDER — AZITHROMYCIN 250 MG PO TABS: 250.0000 mg | ORAL_TABLET | Freq: Every day | ORAL | 1 refills | Status: AC

## 2019-08-20 MED ORDER — INCRUSE ELLIPTA 62.5 MCG/INH IN AEPB: 1.0000 | INHALATION_SPRAY | Freq: Every day | RESPIRATORY_TRACT | 0 refills | Status: DC

## 2019-08-20 MED ORDER — BUDESONIDE 0.5 MG/2ML IN SUSP: 2.0000 mg | Freq: Two times a day (BID) | RESPIRATORY_TRACT | 0 refills | Status: DC

## 2019-08-20 MED ORDER — HYDROCOD POLST-CPM POLST ER 10-8 MG/5ML PO SUER: 5.0000 mL | Freq: Two times a day (BID) | ORAL | 0 refills | Status: AC | PRN

## 2019-08-20 MED ORDER — ARFORMOTEROL TARTRATE 15 MCG/2ML IN NEBU: 15.0000 ug | INHALATION_SOLUTION | Freq: Two times a day (BID) | RESPIRATORY_TRACT | 0 refills | Status: DC

## 2019-08-20 NOTE — Progress Notes (Signed)
PCCM Interval Progress Note  No changes. Needs home NIV, please ensure she has prior to d/c. Continue bevespi, albuterol, pred taper. Add incruse at discharge. Reestablish with pulmonary as an outpatient - appointment scheduled for 08/30/19 at 11am with Rexene Edison, NP.   Nothing further to add.  PCCM will sign off.  Please do not hesitate to call us back if we can be of any further assistance.   Montey Hora, [facility] 08/20/2019, 10:12 AM

## 2019-08-20 NOTE — Progress Notes (Signed)
Courtney Terry Kitchen  PROGRESS NOTE    Courtney Terry  [id number] DOB: [birth year] DOA: 08/12/2019 PCP: [name], MD   Brief Narrative:   Courtney Madan Tinsleyis a 58 y.o.femalewith medical history significant ofasthma, COPDand chronic hypoxic respiratory failure on3Lhome oxygen, hypertension, hypothyroidism, depression, anxiety, bipolar disorder, seizures, tobacco use presenting with complaints of dyspnea.Patient has had a productive cough, shortness of breath, and wheezing for the past 1 week. She is coughing up a lot of mucus. Last night around 7 PM she became acutely worse and could hardly breathe. On arrival to the ED, patient was tachycardic, tachypneic, wheezing, and had significantly increased work of breathing. Oxygen saturation 97% on 3 L home oxygen. Initial ABG showing pH 7.19, PCO2 102, and PO2 108. Patient was placed on BiPAP and her work of breathing improved. She was given albuterol, Atrovent, magnesium 2 g, and Solu-Medrol 125 mg. Afebrile. White blood cell count 18.8. BNP normal. Chest x-ray with findings consistent with COPD and no acute cardiopulmonary disease.  11/11: Says she is still not at baseline. Still a good amount of coughing. 11/12: No acute events ON. Breathing improved. ABG noted. Needs PFT for trelegy. Ordered.  11/13: No acute events ON. Per CM, PFTs necessary for trelegy approval and they only do those on Mondays. That was ordered yesterday. I've called the resp lab, but have not heard back.    Assessment & Plan:   Principal Problem:   COPD with acute exacerbation ([place]) Active Problems:   Hypertension   Tobacco use   Acute on chronic respiratory failure with hypoxia and hypercapnia (HCC)   COPD exacerbation (HCC)  Acute on chronic hypoxemic hypercapnic respiratory failure secondary to COPD exacerbation - COVID 19 negative - Continue 3L [place] (baseline); Bipap at night; still does not feel at baseline this AM; check repeat ABG. - PCCM  following - continue omincef through 11/13; zithro chronically; prednisone 10mg  (home dose); brovana, pulmicort, duonebs; schedule guaifenesin for cough; PRN tussionex, proventil - continue IS (educated on use this morning)     - needs PFTs for trelegy. Ordered. Appreciate pulm assistance. See their note regarding NIV.     - 11/13: Per CM, PFTs only done on Mondays; see pulm note, needs trelegy before she leaves hospice.   Tobacco abuse - encouraged cessation - Nicotine patch  Hypothyroidism - continue lyvothyroxine 97mcg  Anxiety - continue PRN klonopin   Seizure disorder - continue depakote  DVT prophylaxis: lovenox Code Status: DNR   Disposition Plan: Home with [facility]  Consultants:   PCCM   Antimicrobials:  . azithromycin   ROS:  Denies CP, N, V, ab pain . Remainder 10-pt ROS is negative for all not previously mentioned.  Subjective: "They say I need a walker."  Objective: Vitals:   08/20/19 0516 08/20/19 0519 08/20/19 0759 08/20/19 0818  BP: 106/78   108/72  Pulse: 92 (!) 102  91  Resp: 18 19  15   Temp: 98.3 F (36.8 C)   98.5 F (36.9 C)  TempSrc: Oral   Oral  SpO2:  96% 95% 100%  Weight: 67.5 kg     Height:        Intake/Output Summary (Last 24 hours) at 08/20/2019 1329 Last data filed at 08/20/2019 0958 Gross per 24 hour  Intake 240 ml  Output 400 ml  Net -160 ml   Filed Weights   08/18/19 0034 08/19/19 0424 08/20/19 0516  Weight: 67.9 kg 67.4 kg 67.5 kg    Examination:  General: 58 y.o. female resting  in bed in NAD Cardiovascular: RRR, +S1, S2, no m/g/r, equal pulses throughout Respiratory: clear, no w/r/r, normal WOB GI: BS+, NDNT, no masses noted, no organomegaly noted MSK: No e/c/c Neuro: A&O x 3, no focal deficits Psyc: Appropriate interaction and affect, calm/cooperative   Data Reviewed: I have personally reviewed following labs and imaging studies.  CBC: Recent Labs  Lab 08/14/19 0404 08/15/19  0413 08/16/19 0455 08/18/19 0333 08/19/19 1032  WBC 13.8* 15.1* 12.5* 11.7* 14.4*  NEUTROABS  --   --   --   --  12.6*  HGB 11.3* 11.8* 12.8 12.0 12.6  HCT 39.7 41.4 45.1 40.9 42.4  MCV 96.6 97.2 96.2 93.8 94.0  PLT 251 266 266 246 [id number]   Basic Metabolic Panel: Recent Labs  Lab 08/14/19 0404 08/19/19 1032  NA 139 137  K 5.1 4.5  CL 95* 93*  CO2 36* 34*  GLUCOSE 123* 134*  BUN 15 11  CREATININE 0.43* 0.50  CALCIUM 9.2 9.3  MG  --  1.9  PHOS  --  2.4*   GFR: Estimated Creatinine Clearance: 81.1 mL/min (by C-G formula based on SCr of 0.5 mg/dL). Liver Function Tests: Recent Labs  Lab 08/19/19 1032  ALBUMIN 3.2*   No results for input(s): LIPASE, AMYLASE in the last 168 hours. No results for input(s): AMMONIA in the last 168 hours. Coagulation Profile: No results for input(s): INR, PROTIME in the last 168 hours. Cardiac Enzymes: No results for input(s): CKTOTAL, CKMB, CKMBINDEX, TROPONINI in the last 168 hours. BNP (last 3 results) No results for input(s): PROBNP in the last 8760 hours. HbA1C: No results for input(s): HGBA1C in the last 72 hours. CBG: Recent Labs  Lab 08/14/19 1546  GLUCAP 136*   Lipid Profile: No results for input(s): CHOL, HDL, LDLCALC, TRIG, CHOLHDL, LDLDIRECT in the last 72 hours. Thyroid Function Tests: No results for input(s): TSH, T4TOTAL, FREET4, T3FREE, THYROIDAB in the last 72 hours. Anemia Panel: No results for input(s): VITAMINB12, FOLATE, FERRITIN, TIBC, IRON, RETICCTPCT in the last 72 hours. Sepsis Labs: No results for input(s): PROCALCITON, LATICACIDVEN in the last 168 hours.  Recent Results (from the past 240 hour(s))  SARS CORONAVIRUS 2 (TAT 6-24 HRS) Nasopharyngeal Nasopharyngeal Swab     Status: None   Collection Time: 08/12/19  9:25 PM   Specimen: Nasopharyngeal Swab  Result Value Ref Range Status   SARS Coronavirus 2 NEGATIVE NEGATIVE Final    Comment: (NOTE) SARS-CoV-2 target nucleic acids are NOT DETECTED. The  SARS-CoV-2 RNA is generally detectable in upper and lower respiratory specimens during the acute phase of infection. Negative results do not preclude SARS-CoV-2 infection, do not rule out co-infections with other pathogens, and should not be used as the sole basis for treatment or other patient management decisions. Negative results must be combined with clinical observations, patient history, and epidemiological information. The expected result is Negative. Fact Sheet for Patients: SugarRoll.be Fact Sheet for Healthcare Providers: https://www.woods-mathews.com/ This test is not yet approved or cleared by the [place] FDA and  has been authorized for detection and/or diagnosis of SARS-CoV-2 by FDA under an Emergency Use Authorization (EUA). This EUA will remain  in effect (meaning this test can be used) for the duration of the COVID-19 declaration under Section 56 4(b)(1) of the Act, 21 U.S.C. section 360bbb-3(b)(1), unless the authorization is terminated or revoked sooner. Performed at [facility], [place] [address], [place],  [address]       Radiology Studies: No results found.  Scheduled Meds: . anastrozole  1 mg Oral Daily  . arformoterol  15 mcg Nebulization BID  . azithromycin  250 mg Oral Daily  . budesonide (PULMICORT) nebulizer solution  2 mg Nebulization BID  . divalproex  500 mg Oral Daily  . enoxaparin (LOVENOX) injection  40 mg Subcutaneous Daily  . feeding supplement (ENSURE ENLIVE)  237 mL Oral BID BM  . folic acid  1 mg Oral Daily  . guaiFENesin  600 mg Oral BID  . ipratropium-albuterol  3 mL Nebulization BID  . levothyroxine  75 mcg Oral Q0600  . multivitamin with minerals  1 tablet Oral Daily  . nicotine  14 mg Transdermal Daily  . pantoprazole  40 mg Oral Daily  . predniSONE  10 mg Oral Q breakfast   Continuous Infusions:   LOS: 8 days    Time spent: 25 minutes spent in the coordination of  care today.    Jonnie Finner, DO [facility] Pager [phone]  If 7PM-7AM, please contact night-coverage [email or web address] Password [email or web address] 08/20/2019, 1:29 PM

## 2019-08-20 NOTE — Progress Notes (Signed)
Pt currently off BiPAP at this time. BiPAP on stand by at pt bedside. Pt now on 3L [place] and tolerating well. RT will continue to monitor.

## 2019-08-20 NOTE — Plan of Care (Signed)
  Problem: Education: Goal: Knowledge of disease or condition will improve Outcome: Adequate for Discharge Goal: Knowledge of the prescribed therapeutic regimen will improve Outcome: Adequate for Discharge Goal: Individualized Educational Video(s) Outcome: Adequate for Discharge   Problem: Activity: Goal: Ability to tolerate increased activity will improve Outcome: Adequate for Discharge Goal: Will verbalize the importance of balancing activity with adequate rest periods Outcome: Adequate for Discharge   Problem: Respiratory: Goal: Ability to maintain a clear airway will improve Outcome: Adequate for Discharge Goal: Levels of oxygenation will improve Outcome: Adequate for Discharge Goal: Ability to maintain adequate ventilation will improve Outcome: Adequate for Discharge   Problem: Education: Goal: Ability to demonstrate management of disease process will improve Outcome: Adequate for Discharge Goal: Ability to verbalize understanding of medication therapies will improve Outcome: Adequate for Discharge Goal: Individualized Educational Video(s) Outcome: Adequate for Discharge   Problem: Activity: Goal: Capacity to carry out activities will improve Outcome: Adequate for Discharge   Problem: Cardiac: Goal: Ability to achieve and maintain adequate cardiopulmonary perfusion will improve Outcome: Adequate for Discharge   Problem: Education: Goal: Knowledge of General Education information will improve Description: Including pain rating scale, medication(s)/side effects and non-pharmacologic comfort measures Outcome: Adequate for Discharge   Problem: Health Behavior/Discharge Planning: Goal: Ability to manage health-related needs will improve Outcome: Adequate for Discharge   Problem: Clinical Measurements: Goal: Ability to maintain clinical measurements within normal limits will improve Outcome: Adequate for Discharge Goal: Will remain free from infection Outcome:  Adequate for Discharge Goal: Diagnostic test results will improve Outcome: Adequate for Discharge Goal: Respiratory complications will improve Outcome: Adequate for Discharge Goal: Cardiovascular complication will be avoided Outcome: Adequate for Discharge   Problem: Activity: Goal: Risk for activity intolerance will decrease Outcome: Adequate for Discharge   Problem: Nutrition: Goal: Adequate nutrition will be maintained Outcome: Adequate for Discharge   Problem: Coping: Goal: Level of anxiety will decrease Outcome: Adequate for Discharge   Problem: Elimination: Goal: Will not experience complications related to bowel motility Outcome: Adequate for Discharge Goal: Will not experience complications related to urinary retention Outcome: Adequate for Discharge   Problem: Pain Managment: Goal: General experience of comfort will improve Outcome: Adequate for Discharge   Problem: Safety: Goal: Ability to remain free from injury will improve Outcome: Adequate for Discharge   Problem: Skin Integrity: Goal: Risk for impaired skin integrity will decrease Outcome: Adequate for Discharge

## 2019-08-20 NOTE — TOC Transition Note (Signed)
Transition of Care [facility]) - CM/SW Discharge Note   Patient Details  Name: MILCAH PLUFF MRN: [id number] Date of Birth: Mar 02, 1961  [facility] CM/SW Contact:  Zenon Mayo, RN Phone Number: 08/20/2019, 3:01 PM   Clinical Narrative:    Patient for dc today, Zack with [employer] states he saw an ABG that he can use.  Patient will need an oxygen tank to be brought up to room for dc, she has the rollator in the room.  She is set up for outpatient physical therapy on [address].  Patient will need cab voucher for dc to home today.  Zack states the Respiratory therapist will be at patient 's home to set up the NIV today.  Cab voucher given to Network engineer.     Final next level of care: Home/Self Care Barriers to Discharge: No Barriers Identified   Patient Goals and CMS Choice Patient states their goals for this hospitalization and ongoing recovery are:: get better CMS Medicare.gov Compare Post Acute Care list provided to:: Patient Choice offered to / list presented to : NA  Discharge Placement                       Discharge Plan and Services                DME Arranged: Oxygen, Walker rolling with seat, NIV DME Agency: [employer] Date DME Agency Contacted: 08/20/19 Time DME Agency Contacted: 1501 Representative spoke with at DME Agency: [place]: NA          Social Determinants of Health ([place]) Interventions     Readmission Risk Interventions No flowsheet data found.

## 2019-08-20 NOTE — Progress Notes (Signed)
Patient is  alert and oriented comfortable with no distress, HR fluctuates with excretion.

## 2019-08-20 NOTE — Discharge Summary (Addendum)
. Physician Discharge Summary  Courtney Terry [id number] DOB: [birth year] DOA: 08/12/2019  PCP: [name], MD  Admit date: 08/12/2019 Discharge date: 08/20/2019  Admitted From: Home Disposition:  Discharged to home.  Recommendations for Outpatient Follow-up:  1. Follow up with PCP in 1 weeks 2. Please obtain BMP/CBC in one week  Discharge Condition: Stable  CODE STATUS: DNR   Brief/Interim Summary: Courtney Chrysler Tinsleyis a 58 y.o.femalewith medical history significant ofasthma, COPDand chronic hypoxic respiratory failure on3Lhome oxygen, hypertension, hypothyroidism, depression, anxiety, bipolar disorder, seizures, tobacco use presenting with complaints of dyspnea.Patient has had a productive cough, shortness of breath, and wheezing for the past 1 week. She is coughing up a lot of mucus. Last night around 7 PM she became acutely worse and could hardly breathe. On arrival to the ED, patient was tachycardic, tachypneic, wheezing, and had significantly increased work of breathing. Oxygen saturation 97% on 3 L home oxygen. Initial ABG showing pH 7.19, PCO2 102, and PO2 108. Patient was placed on BiPAP and her work of breathing improved. She was given albuterol, Atrovent, magnesium 2 g, and Solu-Medrol 125 mg. Afebrile. White blood cell count 18.8. BNP normal. Chest x-ray with findings consistent with COPD and no acute cardiopulmonary disease.  Discharge Diagnoses:  Principal Problem:   COPD with acute exacerbation ([place]) Active Problems:   Hypertension   Tobacco use   Acute on chronic respiratory failure with hypoxia and hypercapnia (HCC)   COPD exacerbation (HCC)  Acute on chronic hypoxemic hypercapnic respiratory failure secondary to COPD exacerbation - COVID 19 negative - Continue 3L [place] (baseline); Bipap at night; still does not feel at baseline this AM; check repeat ABG. - PCCM following - continue omincef through 11/13; zithrochronically;  prednisone 10mg  (home dose); brovana, pulmicort, duonebs; schedule guaifenesin for cough; PRN tussionex, proventil - continue IS (educated on use this morning) - needs PFTs for trelegy. Ordered. Appreciate pulm assistance. See their note regarding NIV.     - Patient continues to exhibit signs of hypercapnia associated with chronic respiratory failure secondary to severe COPD. Patient requires the use of NIV both QHS and daytime to help with exacerbation periods. The use of the NIV will treat patient's high PCO2 levels and can reduce risk of exacerbation and future hospitalizations when used at night and during the day. Patient will need these advanced setting in the conjunction with the current medication regiment; BIPAP is not an option due to its functional limitations and the severity of the patient's condition. Failure to have NIV available for the use over 24 hours period could lead to death. She is able to protect her airway and effectively cough and clear secretions.  Tobacco abuse - encouraged cessation - Nicotine patch  Hypothyroidism - continue lyvothyroxine 55mcg  Anxiety - continue PRN klonopin   Seizure disorder - continue depakote  Gait disturbance Generalized Weakness     - PT recs HHPT; rollator  Discharge Instructions   Allergies as of 08/20/2019      Reactions   Levaquin [levofloxacin] Hives      Medication List    STOP taking these medications   acetaminophen 325 MG tablet Commonly known as: TYLENOL   collagenase ointment Commonly known as: SANTYL   fludrocortisone 0.1 MG tablet Commonly known as: FLORINEF   FLUoxetine 40 MG capsule Commonly known as: PROZAC   methocarbamol 500 MG tablet Commonly known as: ROBAXIN   oxyCODONE-acetaminophen 5-325 MG tablet Commonly known as: PERCOCET/ROXICET   polyethylene glycol 17 g packet Commonly known as:  MIRALAX / GLYCOLAX     TAKE these medications   albuterol 108 (90  Base) MCG/ACT inhaler Commonly known as: ProAir HFA Inhale 2 puffs into the lungs every 4 (four) hours as needed for wheezing or shortness of breath. INHALE TWO PUFFS INTO THE LUNGS EVERY 4 HOURS AS NEEDED FOR WHEEZING OR SHORTNESS OF BREATH   anastrozole 1 MG tablet Commonly known as: ARIMIDEX Take 1 tablet (1 mg total) by mouth daily.   arformoterol 15 MCG/2ML Nebu Commonly known as: BROVANA Take 2 mLs (15 mcg total) by nebulization 2 (two) times daily.   azithromycin 250 MG tablet Commonly known as: ZITHROMAX Take 1 tablet (250 mg total) by mouth daily.   Bevespi Aerosphere 9-4.8 MCG/ACT Aero Generic drug: Glycopyrrolate-Formoterol Inhale 2 puffs into the lungs 2 (two) times daily.   budesonide 0.5 MG/2ML nebulizer solution Commonly known as: PULMICORT Take 8 mLs (2 mg total) by nebulization 2 (two) times daily.   chlorpheniramine-HYDROcodone 10-8 MG/5ML Suer Commonly known as: TUSSIONEX Take 5 mLs by mouth every 12 (twelve) hours as needed for up to 3 days for cough.   clonazePAM 0.5 MG disintegrating tablet Commonly known as: KLONOPIN Take 1 tablet (0.5 mg total) by mouth 3 (three) times daily as needed (anxiety/ agitation).   dextromethorphan-guaiFENesin 30-600 MG 12hr tablet Commonly known as: MUCINEX DM Take 1 tablet by mouth 2 (two) times daily as needed for up to 7 days for cough.   divalproex 500 MG 24 hr tablet Commonly known as: DEPAKOTE ER Take 1 tablet (500 mg total) by mouth daily.   folic acid 1 MG tablet Commonly known as: FOLVITE Take 1 tablet (1 mg total) by mouth daily.   Incruse Ellipta 62.5 MCG/INH Aepb Generic drug: umeclidinium bromide Inhale 1 puff into the lungs daily.   levothyroxine 75 MCG tablet Commonly known as: SYNTHROID Take 1 tablet (75 mcg total) by mouth daily.   multivitamin with minerals Tabs tablet Take 1 tablet by mouth daily.   nicotine 14 mg/24hr patch Commonly known as: NICODERM CQ - dosed in mg/24 hours Place 1  patch (14 mg total) onto the skin daily.   omeprazole 40 MG capsule Commonly known as: PriLOSEC Take 30-60 min before first meal of the day   predniSONE 10 MG tablet Commonly known as: DELTASONE Take 1 tablet (10 mg total) by mouth daily with breakfast.            Durable Medical Equipment  (From admission, onward)         Start     Ordered   08/20/19 1337  For home use only DME 4 wheeled rolling walker with seat  Once    Question:  Patient needs a walker to treat with the following condition  Answer:  Gait disturbance   08/20/19 1337   08/17/19 1020  For home use only DME Bipap  Once    Question:  Length of Need  Answer:  6 Months   08/17/19 1020   08/16/19 1014  For home use only DME Nebulizer/meds  Once    Question Answer Comment  Patient needs a nebulizer to treat with the following condition COPD (chronic obstructive pulmonary disease) ([place])   Length of Need 6 Months      08/16/19 1015   08/16/19 1013  For home use only DME Nebulizer machine  Once    Question Answer Comment  Patient needs a nebulizer to treat with the following condition COPD (chronic obstructive pulmonary disease) ([place])   Length  of Need 6 Months      08/16/19 1015         Follow-up Information    Parrett, Fonnie Mu, NP. Go on 08/30/2019.   Specialty: Pulmonary Disease Why: In person office appointment.  Scheduled at 11 am.  Please call our office if you are unable to make appointment.  Contact information: [address] [address] [phone]        [facility] Follow up.   Specialty: Rehabilitation Why: They will contact you to schedule your apt time Contact information: [address] [id number] mc [place] [address] [phone]         Allergies  Allergen Reactions  . Levaquin [Levofloxacin] Hives    Consultations:  PCCM   Procedures/Studies: Dg Chest Port 1 View  Result Date:  08/18/2019 CLINICAL DATA:  Shortness of breath, cough, and wheezing. EXAM: PORTABLE CHEST 1 VIEW COMPARISON:  Chest x-ray dated August 12, 2019. FINDINGS: The heart size and mediastinal contours are within normal limits. Atherosclerotic calcification of the aortic arch. Normal pulmonary vascularity. The lungs remain hyperinflated with coarsened interstitial markings. No focal consolidation, pleural effusion, or pneumothorax. No acute osseous abnormality. Surgical clips in the right axilla and breast. IMPRESSION: COPD.  No active disease. Electronically Signed   By: Titus Dubin M.D.   On: 08/18/2019 10:36   Dg Chest Port 1 View  Result Date: 08/12/2019 CLINICAL DATA:  Shortness of breath, history of CHF and COPD EXAM: PORTABLE CHEST 1 VIEW COMPARISON:  Radiograph 12/03/2018, CT 11/08/2018 FINDINGS: Hyperexpansion of the lungs, similar to comparison exam with some mild chronic coarse interstitial changes. No focal opacity. No pneumothorax or effusion. Cardiomediastinal silhouette is similar to prior. Postsurgical changes are noted in the right breast and axilla. No acute osseous or soft tissue abnormality. IMPRESSION: 1. Findings consistent with COPD. No acute cardiopulmonary disease. Electronically Signed   By: Lovena Le M.D.   On: 08/12/2019 21:14      Subjective: "Will they get me a walker?"  Discharge Exam: Vitals:   08/20/19 0759 08/20/19 0818  BP:  108/72  Pulse:  91  Resp:  15  Temp:  98.5 F (36.9 C)  SpO2: 95% 100%   Vitals:   08/20/19 0516 08/20/19 0519 08/20/19 0759 08/20/19 0818  BP: 106/78   108/72  Pulse: 92 (!) 102  91  Resp: 18 19  15   Temp: 98.3 F (36.8 C)   98.5 F (36.9 C)  TempSrc: Oral   Oral  SpO2:  96% 95% 100%  Weight: 67.5 kg     Height:        General: 58 y.o. female resting in bed in NAD Cardiovascular: RRR, +S1, S2, no m/g/r, equal pulses throughout Respiratory: clear, no w/r/r, normal WOB GI: BS+, NDNT, no masses noted, no organomegaly  noted MSK: No e/c/c Neuro: A&O x 3, no focal deficits Psyc: Appropriate interaction and affect, calm/cooperative   The results of significant diagnostics from this hospitalization (including imaging, microbiology, ancillary and laboratory) are listed below for reference.     Microbiology: Recent Results (from the past 240 hour(s))  SARS CORONAVIRUS 2 (TAT 6-24 HRS) Nasopharyngeal Nasopharyngeal Swab     Status: None   Collection Time: 08/12/19  9:25 PM   Specimen: Nasopharyngeal Swab  Result Value Ref Range Status   SARS Coronavirus 2 NEGATIVE NEGATIVE Final    Comment: (NOTE) SARS-CoV-2 target nucleic acids are NOT DETECTED. The SARS-CoV-2 RNA is generally detectable in  upper and lower respiratory specimens during the acute phase of infection. Negative results do not preclude SARS-CoV-2 infection, do not rule out co-infections with other pathogens, and should not be used as the sole basis for treatment or other patient management decisions. Negative results must be combined with clinical observations, patient history, and epidemiological information. The expected result is Negative. Fact Sheet for Patients: SugarRoll.be Fact Sheet for Healthcare Providers: https://www.woods-mathews.com/ This test is not yet approved or cleared by the [place] FDA and  has been authorized for detection and/or diagnosis of SARS-CoV-2 by FDA under an Emergency Use Authorization (EUA). This EUA will remain  in effect (meaning this test can be used) for the duration of the COVID-19 declaration under Section 56 4(b)(1) of the Act, 21 U.S.C. section 360bbb-3(b)(1), unless the authorization is terminated or revoked sooner. Performed at [facility], [place] [address] [place], [place] [address]      Labs: BNP (last 3 results) Recent Labs    11/08/18 0429 11/08/18 1703 08/12/19 2106  BNP 32.2 29.5 [id number]   Basic Metabolic Panel: Recent Labs   Lab 08/14/19 0404 08/19/19 1032  NA 139 137  K 5.1 4.5  CL 95* 93*  CO2 36* 34*  GLUCOSE 123* 134*  BUN 15 11  CREATININE 0.43* 0.50  CALCIUM 9.2 9.3  MG  --  1.9  PHOS  --  2.4*   Liver Function Tests: Recent Labs  Lab 08/19/19 1032  ALBUMIN 3.2*   No results for input(s): LIPASE, AMYLASE in the last 168 hours. No results for input(s): AMMONIA in the last 168 hours. CBC: Recent Labs  Lab 08/14/19 0404 08/15/19 0413 08/16/19 0455 08/18/19 0333 08/19/19 1032  WBC 13.8* 15.1* 12.5* 11.7* 14.4*  NEUTROABS  --   --   --   --  12.6*  HGB 11.3* 11.8* 12.8 12.0 12.6  HCT 39.7 41.4 45.1 40.9 42.4  MCV 96.6 97.2 96.2 93.8 94.0  PLT 251 266 266 246 274   Cardiac Enzymes: No results for input(s): CKTOTAL, CKMB, CKMBINDEX, TROPONINI in the last 168 hours. BNP: Invalid input(s): POCBNP CBG: Recent Labs  Lab 08/14/19 1546  GLUCAP 136*   D-Dimer No results for input(s): DDIMER in the last 72 hours. Hgb A1c No results for input(s): HGBA1C in the last 72 hours. Lipid Profile No results for input(s): CHOL, HDL, LDLCALC, TRIG, CHOLHDL, LDLDIRECT in the last 72 hours. Thyroid function studies No results for input(s): TSH, T4TOTAL, T3FREE, THYROIDAB in the last 72 hours.  Invalid input(s): FREET3 Anemia work up No results for input(s): VITAMINB12, FOLATE, FERRITIN, TIBC, IRON, RETICCTPCT in the last 72 hours. Urinalysis    Component Value Date/Time   COLORURINE YELLOW 11/08/2018 1049   APPEARANCEUR CLOUDY (A) 11/08/2018 1049   LABSPEC 1.024 11/08/2018 1049   PHURINE 7.0 11/08/2018 1049   GLUCOSEU NEGATIVE 11/08/2018 1049   HGBUR SMALL (A) 11/08/2018 1049   BILIRUBINUR NEGATIVE 11/08/2018 1049   KETONESUR NEGATIVE 11/08/2018 1049   PROTEINUR 30 (A) 11/08/2018 1049   UROBILINOGEN 0.2 03/06/2014 1921   NITRITE POSITIVE (A) 11/08/2018 1049   LEUKOCYTESUR NEGATIVE 11/08/2018 1049   Sepsis Labs Invalid input(s): PROCALCITONIN,  WBC,   LACTICIDVEN Microbiology Recent Results (from the past 240 hour(s))  SARS CORONAVIRUS 2 (TAT 6-24 HRS) Nasopharyngeal Nasopharyngeal Swab     Status: None   Collection Time: 08/12/19  9:25 PM   Specimen: Nasopharyngeal Swab  Result Value Ref Range Status   SARS Coronavirus 2 NEGATIVE NEGATIVE Final  Comment: (NOTE) SARS-CoV-2 target nucleic acids are NOT DETECTED. The SARS-CoV-2 RNA is generally detectable in upper and lower respiratory specimens during the acute phase of infection. Negative results do not preclude SARS-CoV-2 infection, do not rule out co-infections with other pathogens, and should not be used as the sole basis for treatment or other patient management decisions. Negative results must be combined with clinical observations, patient history, and epidemiological information. The expected result is Negative. Fact Sheet for Patients: SugarRoll.be Fact Sheet for Healthcare Providers: https://www.woods-mathews.com/ This test is not yet approved or cleared by the [place] FDA and  has been authorized for detection and/or diagnosis of SARS-CoV-2 by FDA under an Emergency Use Authorization (EUA). This EUA will remain  in effect (meaning this test can be used) for the duration of the COVID-19 declaration under Section 56 4(b)(1) of the Act, 21 U.S.C. section 360bbb-3(b)(1), unless the authorization is terminated or revoked sooner. Performed at [facility], [place] [address] [place], [place] [address]      Time coordinating discharge: 35 minutes  SIGNED:   Jonnie Finner, DO  [facility] 08/20/2019, 1:56 PM Pager   If 7PM-7AM, please contact night-coverage [email or web address] Password [email or web address]

## 2019-08-26 ENCOUNTER — Telehealth: Payer: Self-pay | Admitting: Adult Health

## 2019-08-26 ENCOUNTER — Telehealth: Payer: Self-pay | Admitting: Pulmonary Disease

## 2019-08-26 MED ORDER — TRELEGY ELLIPTA 100-62.5-25 MCG/INH IN AEPB: 1.0000 | INHALATION_SPRAY | Freq: Every day | RESPIRATORY_TRACT | 5 refills | Status: DC

## 2019-08-26 MED ORDER — ARFORMOTEROL TARTRATE 15 MCG/2ML IN NEBU: 15.0000 ug | INHALATION_SOLUTION | Freq: Two times a day (BID) | RESPIRATORY_TRACT | 0 refills | Status: DC

## 2019-08-26 MED ORDER — ALBUTEROL SULFATE HFA 108 (90 BASE) MCG/ACT IN AERS: 2.0000 | INHALATION_SPRAY | RESPIRATORY_TRACT | 5 refills | Status: DC | PRN

## 2019-08-26 NOTE — Telephone Encounter (Signed)
Called and spoke with pt who stated she was placed on bipap while in the hospital and wanted to know if she should still do that. Stated to pt to still continue wearing bipap as she was placed on and stated at next OV this could be further discussed with provider to see if she should still do this and pt verbalized understanding.   Called pt's pharmacy and spoke with Tammy stating to her that we were removing brovana off of pt's med list so they could take that off file. tammy verbalized understanding. Nothing further needed.

## 2019-08-26 NOTE — Telephone Encounter (Signed)
I called pt to let her know. She understood to only take the trelegy. I called her pharmacy but was on hold for 10 mins to advise them to take the Brovana off her file. Will try again later.

## 2019-08-26 NOTE — Telephone Encounter (Signed)
I spoke to pt already and I sent a message to Dr. Elsworth Soho regarding her Trelegy and awaiting a response. See most recent message. Will close encounter.

## 2019-08-26 NOTE — Telephone Encounter (Signed)
Discontinue Incruse and Brovana and keep her on Trelegy once daily which would be better for compliance

## 2019-08-26 NOTE — Telephone Encounter (Signed)
Spoke with pt, she states she needs a refill on Trelegy and her nebulizer medication. I called [facility] and they were requesting a refill on Incruse and Brovana. I advised the pharmacist that Dr. Elsworth Soho put her on Trelegy on 07/29/2019. She stated Dr. Marylyn Ishihara sent in a Rx for Incruse on 08/20/2019. Dr. Elsworth Soho please advise on which inhaler pt should be using. Rx for [facility] sent in.

## 2019-08-26 NOTE — Telephone Encounter (Signed)
Patient is calling and had more questions regarding medication. CB is [phone]

## 2019-08-30 ENCOUNTER — Telehealth: Payer: Self-pay

## 2019-08-30 ENCOUNTER — Encounter: Payer: Self-pay | Admitting: Adult Health

## 2019-08-30 ENCOUNTER — Inpatient Hospital Stay: Payer: Medicaid Other | Admitting: Adult Health

## 2019-08-30 ENCOUNTER — Ambulatory Visit (INDEPENDENT_AMBULATORY_CARE_PROVIDER_SITE_OTHER): Payer: Medicaid Other | Admitting: Adult Health

## 2019-08-30 DIAGNOSIS — J441 Chronic obstructive pulmonary disease with (acute) exacerbation: Secondary | ICD-10-CM

## 2019-08-30 DIAGNOSIS — J9611 Chronic respiratory failure with hypoxia: Secondary | ICD-10-CM | POA: Diagnosis not present

## 2019-08-30 DIAGNOSIS — J449 Chronic obstructive pulmonary disease, unspecified: Secondary | ICD-10-CM

## 2019-08-30 NOTE — Telephone Encounter (Signed)
Per TP please contact patient for PFT and covid scheduling.   Thanks

## 2019-08-30 NOTE — Progress Notes (Signed)
Virtual Visit via Telephone Note  I connected with CHANIECE MIGA on 08/30/19 at  2:00 PM EST by telephone and verified that I am speaking with the correct person using two identifiers.  Location: Patient: Home  Provider: Office    I discussed the limitations, risks, security and privacy concerns of performing an evaluation and management service by telephone and the availability of in person appointments. I also discussed with the patient that there may be a patient responsible charge related to this service. The patient expressed understanding and agreed to proceed.   History of Present Illness: 58 year old female active smoker followed for severe COPD and chronic respiratory failure on home oxygen. Medical history significant for polysubstance abuse-cocaine, severe protein calorie malnutrition, schizophrenia Prolonged hospitalization February 2020 for COPD exacerbation, cocaine positive, required tracheostomy with eventual decannulation.   Today's televisit as a post hospital follow-up.  Patient was admitted with a COPD exacerbation with acute on chronic hypercarbic and hypoxic respiratory failure ABG on arrival showed a pH of 7.19, PCO2 102 and a PO2 of 108.  She was placed on BiPAP with improvement in her work of breathing.  She also received mag museum sulfate 2 g and Solu-Medrol 125 mg IV.  Patient slowly improved.  She was transition to empiric antibiotics.  COVID-19 testing was negative and chest x-ray showed no acute process.  At discharge she was placed on increased oxygen support at 3 L.  Placed on nocturnal BiPAP , placed on azithromycin 250 mg daily. She was recommended to use NIV/trilogy however insurance would not cover.  And patient was placed on BiPAP.  She will need pulmonary function testing in the outpatient setting in order to order trilogy support. Since discharge patient says she is feeling better.  She has decreased cough congestion and shortness of breath.  She is now on  oxygen at 3 L.  She was discharged on a BiPAP however patient says she has not been wearing it.  She feels that she is scared of the device.  Long discussion regarding the use of BiPAP how it helps her and its use is to help decrease her exacerbations and hospitalizations.  Also explained to her her underlying COPD condition and hypercarbia.  We discussed using the BiPAP during the daytime for increased shortness of breath and also to help with desensitization and to use it throughout the night. She says she has not smoked in 3 weeks.  She remains on Trelegy inhaler.  She denies any increased cough or congestion.  No fever chest pain or orthopnea.   Observations/Objective:  Spirometry 10/2016>>FVC 1.79 (51%],FEV1 0.69 (25%), ratio39  Echo 11/2017 RVSP 48  CT chest 2/2019Scattered predominantly subpleural nodules in the lungs   Assessment and Plan: Recent acute COPD exacerbation she is prone to severe flares and is previously been on mechanical ventilatory support earlier this year.  She has quit smoking.  Smoking cessation was encouraged.  She has hypercarbic respiratory failure.  Along with her frequent COPD exacerbation makes her at high risk for decompensation.  Feel that she would benefit from a NIV/trilogy device however her insurance would not cover for now we will continue on BiPAP to hopefully help decrease her exacerbations and hypercarbia.  We will have her return for PFTs and then repetition insurance for a trilogy device.  Patient education on BiPAP and uses of BiPAP We will also use daily azithromycin to help with exacerbation frequency.  Chronic hypercarbic and hypoxic respiratory failure-continue on oxygen at 3 L.  O2  saturation goals 88 to 90%. We will also use nocturnal BiPAP.  Plan  Patient Instructions  Continue on TRELEGY 1 puff daily . Rinse after use.  Continue on Oxygen 3l/m .  Continue on Azithromycin daily  Great job on not smoking . Keep up good work,.   Please use BIPAP At bedtime  -try to wear all night .  May use BIPAP during daytime to get used to it and become more comfortable.  Follow up with Dr. Elsworth Soho  In 6 weeks and As needed   Please contact office for sooner follow up if symptoms do not improve or worsen or seek emergency care     '  Follow Up Instructions:   Follow-up in 6 weeks and as needed  Please contact office for sooner follow up if symptoms do not improve or worsen or seek emergency care   I discussed the assessment and treatment plan with the patient. The patient was provided an opportunity to ask questions and all were answered. The patient agreed with the plan and demonstrated an understanding of the instructions.   The patient was advised to call back or seek an in-person evaluation if the symptoms worsen or if the condition fails to improve as anticipated.  I provided 26 minutes of non-face-to-face time during this encounter.   Rexene Edison, NP

## 2019-08-30 NOTE — Patient Instructions (Addendum)
Continue on TRELEGY 1 puff daily . Rinse after use.  Continue on Oxygen 3l/m .  Continue on Azithromycin daily  Great job on not smoking . Keep up good work,.  Please use BIPAP At bedtime  -try to wear all night .  May use BIPAP during daytime to get used to it and become more comfortable.  Follow up with Dr. Elsworth Soho  In 6 weeks and As needed with PFTs.  Please contact office for sooner follow up if symptoms do not improve or worsen or seek emergency care

## 2019-09-03 ENCOUNTER — Other Ambulatory Visit: Payer: Self-pay | Admitting: Primary Care

## 2019-09-03 MED ORDER — TRELEGY ELLIPTA 100-62.5-25 MCG/INH IN AEPB: 1.0000 | INHALATION_SPRAY | Freq: Every day | RESPIRATORY_TRACT | 6 refills | Status: DC

## 2019-09-03 NOTE — Telephone Encounter (Signed)
This PFT was ordered by someone at [facility] for the patient.  I had to LM w/ the person to schedule the PFT.  Once this has been scheduled, I will contact the patient with a COVID test.

## 2019-09-07 ENCOUNTER — Ambulatory Visit: Payer: Medicaid Other | Attending: Internal Medicine | Admitting: Physical Therapy

## 2019-09-08 ENCOUNTER — Ambulatory Visit: Payer: Self-pay | Admitting: Internal Medicine

## 2019-09-09 NOTE — Telephone Encounter (Signed)
Please see message below. We order here and go ahead and have our [facility] schedule if you would like. If so just route back to triage. Thanks

## 2019-09-09 NOTE — Telephone Encounter (Signed)
Okay to order PFT and prescreen Covid

## 2019-09-10 NOTE — Telephone Encounter (Signed)
PT sched for PFT on 12/15 & COVID sched for 12/12.  Pt verbalized understanding & nothing further needed at this time.

## 2019-09-10 NOTE — Addendum Note (Signed)
Addended by: Vivia Ewing on: 09/10/2019 05:32 PM   Modules accepted: Orders

## 2019-09-10 NOTE — Telephone Encounter (Signed)
PFT has not been placed by anyone from [facility] for [place] to be able to schedule.  We will need to have a new order placed by one our nurses so I can schedule this, please.

## 2019-09-10 NOTE — Telephone Encounter (Signed)
New order placed. Thanks holly.

## 2019-09-13 ENCOUNTER — Other Ambulatory Visit: Payer: Self-pay | Admitting: Pulmonary Disease

## 2019-09-14 NOTE — Telephone Encounter (Signed)
Nothing further needed at this time. 

## 2019-09-15 ENCOUNTER — Telehealth: Payer: Self-pay | Admitting: Pulmonary Disease

## 2019-09-15 ENCOUNTER — Ambulatory Visit: Payer: Medicaid Other

## 2019-09-15 MED ORDER — TRELEGY ELLIPTA 100-62.5-25 MCG/INH IN AEPB: 1.0000 | INHALATION_SPRAY | Freq: Every day | RESPIRATORY_TRACT | 0 refills | Status: DC

## 2019-09-15 NOTE — Telephone Encounter (Signed)
Pt is calling back - returning call  Regarding the samples of Trelegy -

## 2019-09-15 NOTE — Telephone Encounter (Signed)
LMTCB x1 for pt.  

## 2019-09-15 NOTE — Telephone Encounter (Signed)
I called and spoke with the patient and made her aware that I am placing samples of Trelegy up front for her. Nothing further is needed.

## 2019-09-16 ENCOUNTER — Telehealth: Payer: Self-pay | Admitting: Pulmonary Disease

## 2019-09-16 NOTE — Telephone Encounter (Signed)
Okay goodness. That is a dilemma. I saw an email that testing is moving to appointment only .  Can we look into this and see if this would work for her .  If not then we need to reschedule her PFT and look into how to get her tested . Will have to get creative, maybe even look at [facility] sites .

## 2019-09-16 NOTE — Telephone Encounter (Signed)
I spoke to pt and advised her of message from TP. She stated she would call [facility] and call me back. I advised her to call them and see what their procedure is and if she would have the results back by Tuesday 12/15. Pt agreed and will call me back to let me know. Will await return call.

## 2019-09-16 NOTE — Telephone Encounter (Signed)
Spoke with patient. She stated that the only transportation she has available is via the scat bus to her covid test on Saturday. She will not be allowed to simply get off of the bus, get tested and then return back to the bus. She has to wait 2 hours before it is her time to be picked up. She is concerned because she is on oxygen and her O2 tanks will not last 2hrs. Also, there is nowhere for her to wait for the bus since this is before the switch to indoor testing. She wants to know if she could hold off on the PFT for now.   TP, please advise. Thanks!

## 2019-09-18 ENCOUNTER — Other Ambulatory Visit (HOSPITAL_COMMUNITY): Payer: Medicaid Other

## 2019-09-20 ENCOUNTER — Telehealth: Payer: Self-pay | Admitting: Pulmonary Disease

## 2019-09-20 ENCOUNTER — Other Ambulatory Visit: Payer: Self-pay

## 2019-09-20 DIAGNOSIS — Z20822 Contact with and (suspected) exposure to covid-19: Secondary | ICD-10-CM

## 2019-09-20 NOTE — Telephone Encounter (Signed)
THE PFT was cancelled. Will  close encounter.

## 2019-09-20 NOTE — Telephone Encounter (Signed)
Called and spoke with pt. Pt is wanting to be referred to either [facility] or [facility] to see if she qualifies for a lung transplant. Dr. Elsworth Soho, please advise if you are okay with [place] placing this referral.

## 2019-09-20 NOTE — Telephone Encounter (Addendum)
Pt will need to r/s PFT & COVID test for January.  Pt states she is getting COVID tested today for a funeral.  I offered to schedule the PFT 12/17 in our office as we had a cancellation, pt refused.  Pt aware I will call her next week to schedule this as [facility] does not have January's schedule yet available per [facility].  Pt can complete the COVID test 3 days prior to the PFT at a non-[facility] location as long as the proof & RESULTS are faxed to Katie at [phone] prior to the PFT 1 day in advance of the PFT.

## 2019-09-21 ENCOUNTER — Telehealth: Payer: Self-pay | Admitting: Pulmonary Disease

## 2019-09-21 ENCOUNTER — Encounter (HOSPITAL_COMMUNITY): Payer: Medicaid Other

## 2019-09-21 LAB — NOVEL CORONAVIRUS, NAA: SARS-CoV-2, NAA: NOT DETECTED

## 2019-09-21 NOTE — Telephone Encounter (Signed)
LMOMTCB x 1 

## 2019-09-21 NOTE — Telephone Encounter (Signed)
Spoke with [employer]. She was calling to see exactly what inhaler the patient was supposed to be on. She called the patient and she stated that she was off of [place]. Georgena Cockayne that she is supposed to be on Trelegy. Ailene Ravel was able to pull a claims report, patient has not refilled any inhalers since 10/21. Tamani Cockayne that if a PA was needed, we never received the report but I can call [employer] to start one. She verbalized understanding.   Called [employer]. PA was approved for the Trelegy 100. Medication is covered from 09/21/19-09/17/20.   [facility] [address] [facility]. Medication will cost $3 per month.   Spoke with Ailene Ravel again. She verbalized understanding. She stated that she will let the patient know that the medication was approved.   Nothing further needed at time of call.

## 2019-09-21 NOTE — Telephone Encounter (Signed)
We can discuss this further at her next follow-up appointment-if she is able to remain off nicotine and drugs

## 2019-09-21 NOTE — Telephone Encounter (Signed)
Spoke with patient. She stated that she has not smoked in over 3 months. She is using the nicotine patches. Based on the information in her chart, this was done back on 07/29/19. She wanted to let RA know.

## 2019-09-21 NOTE — Telephone Encounter (Signed)
She has to stay off cigarettes & cocaine/ drugs for at least 3 months before they will accept a referral

## 2019-09-21 NOTE — Telephone Encounter (Signed)
Patient is returning phone call.  Patient phone number is [phone].

## 2019-09-21 NOTE — Telephone Encounter (Signed)
Left message for patient to call back  

## 2019-09-22 ENCOUNTER — Telehealth: Payer: Self-pay

## 2019-09-22 ENCOUNTER — Telehealth: Payer: Self-pay | Admitting: Pulmonary Disease

## 2019-09-22 NOTE — Telephone Encounter (Signed)
Spoke with the pt  She is requesting that her covid results be mailed to her  Done- verified address  Nothing further needed

## 2019-09-22 NOTE — Telephone Encounter (Signed)
Caller given negative result and verbalized understanding  

## 2019-09-22 NOTE — Telephone Encounter (Signed)
LMTCB x2 for pt 

## 2019-09-23 NOTE — Telephone Encounter (Signed)
LMTCB x3 for pt. We have attempted to contact pt several times with no success or call back from pt. Per triage protocol, message will be closed.   

## 2019-10-04 ENCOUNTER — Encounter (HOSPITAL_COMMUNITY): Payer: Medicaid Other

## 2019-11-02 ENCOUNTER — Telehealth: Payer: Self-pay | Admitting: Pulmonary Disease

## 2019-11-02 NOTE — Telephone Encounter (Signed)
Okay to switch to Symbicort. She has to be off cigarettes for 6 months

## 2019-11-02 NOTE — Telephone Encounter (Signed)
Spoke with pt, she states she stopped smoking cigarettes about 4 months ago and would like to know if she could be on the lung transplant list.  She is requesting a Rx for Symbicort instead of Trelegy. She states it works better for her than Trelegy. Can we switch inhalers? Please advise.

## 2019-11-02 NOTE — Telephone Encounter (Signed)
ATC pt, no answer. Left message for pt to call back.  

## 2019-11-03 ENCOUNTER — Telehealth: Payer: Self-pay | Admitting: Pulmonary Disease

## 2019-11-03 ENCOUNTER — Ambulatory Visit: Payer: Medicaid Other | Admitting: Pulmonary Disease

## 2019-11-03 MED ORDER — PREDNISONE 10 MG PO TABS: 10.0000 mg | ORAL_TABLET | Freq: Every day | ORAL | 2 refills | Status: DC

## 2019-11-03 NOTE — Telephone Encounter (Signed)
LMTCB x2 for pt 

## 2019-11-03 NOTE — Telephone Encounter (Signed)
Spoke with pt. She is needing a refill on her daily prednisone. Rx has been sent in. Nothing further was needed. 

## 2019-11-04 ENCOUNTER — Other Ambulatory Visit: Payer: Self-pay

## 2019-11-04 ENCOUNTER — Ambulatory Visit
Admission: RE | Admit: 2019-11-04 | Discharge: 2019-11-04 | Disposition: A | Discharge status: Home / Self Care | Payer: Medicaid Other | Source: Ambulatory Visit | Attending: Hematology | Admitting: Hematology

## 2019-11-04 DIAGNOSIS — C50411 Malignant neoplasm of upper-outer quadrant of right female breast: Secondary | ICD-10-CM

## 2019-11-04 DIAGNOSIS — Z170 Estrogen receptor positive status [ER+]: Secondary | ICD-10-CM

## 2019-11-04 NOTE — Telephone Encounter (Signed)
Called and spoke to pt. Advised pt of the recs per RA. Pt verbalized understanding. However, pt was never prescribed Symbicort from our office, pt was using her friends symbicort (unsure of strength). Pt states there were a couple times she would use her Trelegy and Symbicort in the same day, advised pt against this. Pt states she has decided she would like to stay on the Trelegy for now and call back if she would like to change. Will need to send to Dr. Elsworth Soho if pt wants to switch as we havent scripted her Symbicort before. Pt denied needed any further questions or concerns at this time.

## 2019-11-09 ENCOUNTER — Telehealth: Payer: Self-pay | Admitting: Pulmonary Disease

## 2019-11-09 MED ORDER — FLUTICASONE-SALMETEROL 250-50 MCG/DOSE IN AEPB: 1.0000 | INHALATION_SPRAY | Freq: Two times a day (BID) | RESPIRATORY_TRACT | 5 refills | Status: DC

## 2019-11-09 MED ORDER — TRELEGY ELLIPTA 200-62.5-25 MCG/INH IN AEPB: 1.0000 | INHALATION_SPRAY | Freq: Every day | RESPIRATORY_TRACT | 0 refills | Status: DC

## 2019-11-09 MED ORDER — SPIRIVA RESPIMAT 2.5 MCG/ACT IN AERS: 2.0000 | INHALATION_SPRAY | Freq: Every day | RESPIRATORY_TRACT | 5 refills | Status: DC

## 2019-11-09 NOTE — Telephone Encounter (Signed)
Spoke with pt. States that she needs samples of Trelegy. Her insurance will not cover Trelegy. We do not have samples of Trelegy at this time and pt is aware. Pt is very demanding that we send something in for her "RIGHT NOW." She mentioned something about Bevespi but we do not have samples of that either. Pt stated that she has not had any medication in the past 2 days. Medicaid prefers Advair, Dulera or Symbicort.  Tammy - please advise. Thanks.

## 2019-11-09 NOTE — Telephone Encounter (Signed)
Okay . If we have Trelegy 200  that is fine to leave couple of sample up front .   Can use Advair and Spiriva (this will replace her Trelegy )   Can send Advair 250 1 puff Twice daily  And Spiriva Respimat 2 puff daily #1 , 5 refills to pharm , see If medicaid will cover   Keep follow up with [facility]   Please contact office for sooner follow up if symptoms do not improve or worsen or seek emergency care

## 2019-11-09 NOTE — Telephone Encounter (Signed)
Spoke with pt. She is aware of Tammy's response. Samples have been left up front for pt. Rxs have been sent in. Nothing further was needed.

## 2019-11-12 ENCOUNTER — Telehealth: Payer: Self-pay | Admitting: Pulmonary Disease

## 2019-11-12 ENCOUNTER — Other Ambulatory Visit: Payer: Self-pay

## 2019-11-12 ENCOUNTER — Ambulatory Visit (INDEPENDENT_AMBULATORY_CARE_PROVIDER_SITE_OTHER): Payer: Medicaid Other | Admitting: Primary Care

## 2019-11-12 ENCOUNTER — Encounter: Payer: Self-pay | Admitting: Primary Care

## 2019-11-12 DIAGNOSIS — J441 Chronic obstructive pulmonary disease with (acute) exacerbation: Secondary | ICD-10-CM | POA: Diagnosis not present

## 2019-11-12 MED ORDER — TRELEGY ELLIPTA 100-62.5-25 MCG/INH IN AEPB: 1.0000 | INHALATION_SPRAY | Freq: Every day | RESPIRATORY_TRACT | 5 refills | Status: DC

## 2019-11-12 MED ORDER — PREDNISONE 10 MG PO TABS: ORAL_TABLET | ORAL | 0 refills | Status: DC

## 2019-11-12 NOTE — Telephone Encounter (Signed)
Spoke with pt, states that she ran out of inhalers X2 days ago (per chart was on Trelegy, we placed Trelegy samples up front on 2/2 when pt called about running out of her inhalers, and called in Advair 250 and Spiriva 2.5).  Trelegy samples were picked up by patient, and Advair/Spiriva rx's were not picked up at pharmacy d/t needing a prior authorization.  Since having a lapse in maintenance inhaler therapy, pt has experienced dull lateral chest ache, upper/middle back pain, increased sob with exertion. Denies mucus production, sharp chest pains, fever, chills.    Pt has been using albuterol to help with SOB, has been taking Trelegy samples.    [facility] [address]  Sending to [place] for recs since RA is not in office.  Aaron Edelman please advise on recs.  Pt will need to have a PA for spiriva and Advair or Trelegy (see 2/2 phone note- was switched from Trelegy d/t insurance noncoverage.  Pt has Medicaid, any inhaler will have to have a prior auth)

## 2019-11-12 NOTE — Patient Instructions (Addendum)
Resume Trelegy 100- take one puff daily (prior authorization approved and RX sent to pharmacy)  Continue Albuterol rescue inhaler 2 puffs every 4 hours for shortness of breath/wheezing  Rx: Prednisone 20mg  x 5 days; then resume 10mg  daily  Get CXR on Monday 2/8  Follow-up as needed if symptoms do not improve or ED if symptoms worsen

## 2019-11-12 NOTE — Telephone Encounter (Signed)
I would offer patient a virtual visit option for further evaluation of her symptoms.  This is likely a COPD exacerbation given her continued issues with maintenance medications.Yes we will need to complete a PA for the Spiriva and the Advair.  Has this process been started?If the patient refuses a televisit please let me know.Wyn Quaker, FNP

## 2019-11-12 NOTE — Progress Notes (Signed)
Virtual Visit via Telephone Note  I connected with Courtney Terry on 11/12/19 at  3:00 PM EST by telephone and verified that I am speaking with the correct person using two identifiers.  Location: Patient: Home Provider: Office   I discussed the limitations, risks, security and privacy concerns of performing an evaluation and management service by telephone and the availability of in person appointments. I also discussed with the patient that there may be a patient responsible charge related to this service. The patient expressed understanding and agreed to proceed.   History of Present Illness: 59 year old female, current every day smoker. PMH significant for COPD GOLD IV, chronic respiratory failure with hypoxia, CHF, hypertension, hypothyroidism, epilepsy, bipolar disorder type 1/schizophrenia, substance abuse, breast cancer s/p right mastectomy. Patient of Dr. Elsworth Soho, last seen by pulmonary NP on 08/30/19. Called office on 11/09/19 because medicaid will not pay for Trelegy and asking for samples (given Trelegy 200). Medicare prefers Advair, Dulera or Symbicort. Sent in RX Advair 250 and Spiriva respimat. Prior authorization for Trelegy 100 being processed.   11/12/2019 Patient reports acute symptoms of wheezing and shortness of breath x2-3 days. She had been without Trelegy since 11/07/19, picked up samples of Trelegy on 11/11/19. PA for Trelegy 100 was approved. She tried Advair but reported back pain after using. No significant cough or mucus production. Patient asking for antibiotics and pain medication. Discussed with patient that we should check chest xray first. Quit smoking 4 months ago.   Observations/Objective:  - No significant shortness of breath, wheezing or cough noted during phone conversation - Able to speak in full sentences  Assessment and Plan:  COPD GOLD IV: - Mild exacerbation d/t break in inhaler therapy. Patient went 4 days without ICS/LABA/LAMA.  - PA recently approved  for Trelegy 100  - Stop Advair/Spiriva d/t back pain - Resume Trelegy 100 one puff daily; prn albuterol hfa two puffs q 4 hours  - Rx prednisone 20mg  x 5 days; then resume 10mg  daily  - Defer pain medication d/t hx substance abuse, recommending ibuprofen prn  - Checking CXR - patient to get done on Monday 2/8  Follow Up Instructions:   - As needed if symptoms do not improve, ED if symptoms worsen   I discussed the assessment and treatment plan with the patient. The patient was provided an opportunity to ask questions and all were answered. The patient agreed with the plan and demonstrated an understanding of the instructions.   The patient was advised to call back or seek an in-person evaluation if the symptoms worsen or if the condition fails to improve as anticipated.  I provided 22 minutes of non-face-to-face time during this encounter.   Martyn Ehrich, NP

## 2019-11-12 NOTE — Telephone Encounter (Signed)
Discussed with Courtney Terry that pt is already taking Trelegy and will need a PA for any inhaler we prescribe d/t her insurance.  Courtney Terry states to do a PA for Trelegy instead of Spiriva and Advair. Called [employer], PA has been approved for Trelegy from 11/12/2019 - 11/05/2020.  PA#: [id number]  A new RX has been sent to pharmacy for Trelegy since previously Advair and Spiriva were sent in.  Med list has been updated to reflect inhaler changes.     Spoke with pt to make aware of above info.  Scheduled today at 3:00 for a televisit with Beth.  Nothing further needed at this time- will close encounter.

## 2019-11-15 ENCOUNTER — Other Ambulatory Visit: Payer: Self-pay

## 2019-11-15 ENCOUNTER — Emergency Department (HOSPITAL_COMMUNITY)
Admission: EM | Admit: 2019-11-15 | Discharge: 2019-11-15 | Disposition: A | Discharge status: Home / Self Care | Payer: Medicaid Other | Attending: Emergency Medicine | Admitting: Emergency Medicine

## 2019-11-15 ENCOUNTER — Emergency Department (HOSPITAL_COMMUNITY): Payer: Medicaid Other

## 2019-11-15 DIAGNOSIS — R0789 Other chest pain: Secondary | ICD-10-CM | POA: Diagnosis not present

## 2019-11-15 DIAGNOSIS — Z853 Personal history of malignant neoplasm of breast: Secondary | ICD-10-CM | POA: Insufficient documentation

## 2019-11-15 DIAGNOSIS — J449 Chronic obstructive pulmonary disease, unspecified: Secondary | ICD-10-CM | POA: Diagnosis not present

## 2019-11-15 DIAGNOSIS — Z79899 Other long term (current) drug therapy: Secondary | ICD-10-CM | POA: Insufficient documentation

## 2019-11-15 DIAGNOSIS — Z20822 Contact with and (suspected) exposure to covid-19: Secondary | ICD-10-CM | POA: Diagnosis not present

## 2019-11-15 DIAGNOSIS — R071 Chest pain on breathing: Secondary | ICD-10-CM

## 2019-11-15 DIAGNOSIS — R0602 Shortness of breath: Secondary | ICD-10-CM | POA: Diagnosis present

## 2019-11-15 DIAGNOSIS — I10 Essential (primary) hypertension: Secondary | ICD-10-CM | POA: Diagnosis not present

## 2019-11-15 LAB — CBC WITH DIFFERENTIAL/PLATELET
Abs Immature Granulocytes: 0.11 10*3/uL — ABNORMAL HIGH (ref 0.00–0.07)
Basophils Absolute: 0.1 10*3/uL (ref 0.0–0.1)
Basophils Relative: 0 %
Eosinophils Absolute: 0 10*3/uL (ref 0.0–0.5)
Eosinophils Relative: 0 %
HCT: 41.9 % (ref 36.0–46.0)
Hemoglobin: 12.3 g/dL (ref 12.0–15.0)
Immature Granulocytes: 1 %
Lymphocytes Relative: 5 %
Lymphs Abs: 0.7 10*3/uL (ref 0.7–4.0)
MCH: 27.4 pg (ref 26.0–34.0)
MCHC: 29.4 g/dL — ABNORMAL LOW (ref 30.0–36.0)
MCV: 93.3 fL (ref 80.0–100.0)
Monocytes Absolute: 0.4 10*3/uL (ref 0.1–1.0)
Monocytes Relative: 3 %
Neutro Abs: 13.9 10*3/uL — ABNORMAL HIGH (ref 1.7–7.7)
Neutrophils Relative %: 91 %
Platelets: 294 10*3/uL (ref 150–400)
RBC: 4.49 MIL/uL (ref 3.87–5.11)
RDW: 14.6 % (ref 11.5–15.5)
WBC: 15.2 10*3/uL — ABNORMAL HIGH (ref 4.0–10.5)
nRBC: 0 % (ref 0.0–0.2)

## 2019-11-15 LAB — COMPREHENSIVE METABOLIC PANEL
ALT: 18 U/L (ref 0–44)
AST: 17 U/L (ref 15–41)
Albumin: 3.6 g/dL (ref 3.5–5.0)
Alkaline Phosphatase: 97 U/L (ref 38–126)
Anion gap: 9 (ref 5–15)
BUN: 15 mg/dL (ref 6–20)
CO2: 30 mmol/L (ref 22–32)
Calcium: 9.1 mg/dL (ref 8.9–10.3)
Chloride: 100 mmol/L (ref 98–111)
Creatinine, Ser: 0.55 mg/dL (ref 0.44–1.00)
GFR calc Af Amer: >60 mL/min (ref >60)
GFR calc non Af Amer: >60 mL/min (ref >60)
Glucose, Bld: 123 mg/dL — ABNORMAL HIGH (ref 70–99)
Potassium: 4.4 mmol/L (ref 3.5–5.1)
Sodium: 139 mmol/L (ref 135–145)
Total Bilirubin: 0.4 mg/dL (ref 0.3–1.2)
Total Protein: 7.1 g/dL (ref 6.5–8.1)

## 2019-11-15 LAB — RESPIRATORY PANEL BY RT PCR (FLU A&B, COVID)
Influenza A by PCR: NEGATIVE
Influenza B by PCR: NEGATIVE
SARS Coronavirus 2 by RT PCR: NEGATIVE

## 2019-11-15 LAB — URINALYSIS, ROUTINE W REFLEX MICROSCOPIC
Bilirubin Urine: NEGATIVE
Glucose, UA: NEGATIVE mg/dL
Hgb urine dipstick: NEGATIVE
Ketones, ur: NEGATIVE mg/dL
Leukocytes,Ua: NEGATIVE
Nitrite: NEGATIVE
Protein, ur: NEGATIVE mg/dL
Specific Gravity, Urine: 1.018 (ref 1.005–1.030)
pH: 7 (ref 5.0–8.0)

## 2019-11-15 LAB — TROPONIN I (HIGH SENSITIVITY)
Troponin I (High Sensitivity): 3 ng/L (ref <18)
Troponin I (High Sensitivity): 2 ng/L (ref <18)

## 2019-11-15 LAB — BRAIN NATRIURETIC PEPTIDE: B Natriuretic Peptide: 61.6 pg/mL (ref 0.0–100.0)

## 2019-11-15 MED ORDER — OXYCODONE-ACETAMINOPHEN 5-325 MG PO TABS: 1.0000 | ORAL_TABLET | ORAL | 0 refills | Status: DC | PRN

## 2019-11-15 MED ORDER — SODIUM CHLORIDE (PF) 0.9 % IJ SOLN
INTRAMUSCULAR | Status: AC
  Filled 2019-11-15: qty 50

## 2019-11-15 MED ORDER — MORPHINE SULFATE (PF) 4 MG/ML IV SOLN
4.0000 mg | Freq: Once | INTRAVENOUS | Status: AC
  Administered 2019-11-15: 4 mg via INTRAVENOUS
  Filled 2019-11-15: qty 1

## 2019-11-15 MED ORDER — IOHEXOL 350 MG/ML SOLN
100.0000 mL | Freq: Once | INTRAVENOUS | Status: AC | PRN
  Administered 2019-11-15: 100 mL via INTRAVENOUS

## 2019-11-15 MED ORDER — LIDOCAINE 5 % EX PTCH: 1.0000 | MEDICATED_PATCH | CUTANEOUS | 0 refills | Status: DC

## 2019-11-15 NOTE — ED Notes (Signed)
Patient requires 2L [place]. Waiting in treatment room for ride on 2L.

## 2019-11-15 NOTE — ED Notes (Signed)
Patient transported to CT 

## 2019-11-15 NOTE — ED Notes (Signed)
Patient provided with female urinal.

## 2019-11-15 NOTE — ED Triage Notes (Signed)
Pt arrived via [facility] from home CC SOB and pain on right side during inspiration X 3 days. Pt able to speak in compete sentence on 2L oxygen at baseline.     Hx. CPOD Asthma Trach and Cocaine use ([place] for year. Anxiety not medicated.
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
## 2019-11-15 NOTE — Discharge Instructions (Signed)
Your work-up today was overall reassuring.  Your heart labs were negative.  We checked them and the CT scan did not show evidence of blood clot.  There was no pneumonia seen and there is no extra fluid.  I suspect your symptoms are related to your chest wall consistent with the discomfort and pain when you are taking deep breaths.  Please use the pain medicine in the and the Lidoderm patches to help with the symptoms.  Please rest and follow-up with your primary doctor in the next few days.  If any symptoms change or worsen, please return to discharge department.

## 2019-11-16 LAB — URINE CULTURE

## 2019-11-29 ENCOUNTER — Other Ambulatory Visit: Payer: Self-pay

## 2019-11-29 ENCOUNTER — Encounter: Payer: Self-pay | Admitting: Pulmonary Disease

## 2019-11-29 ENCOUNTER — Ambulatory Visit (INDEPENDENT_AMBULATORY_CARE_PROVIDER_SITE_OTHER): Payer: Medicaid Other | Admitting: Pulmonary Disease

## 2019-11-29 VITALS — BP 128/78 | HR 128 | Temp 97.1°F | Ht 69.0 in | Wt 192.4 lb

## 2019-11-29 DIAGNOSIS — J449 Chronic obstructive pulmonary disease, unspecified: Secondary | ICD-10-CM | POA: Diagnosis not present

## 2019-11-29 DIAGNOSIS — F1910 Other psychoactive substance abuse, uncomplicated: Secondary | ICD-10-CM

## 2019-11-29 DIAGNOSIS — J9611 Chronic respiratory failure with hypoxia: Secondary | ICD-10-CM | POA: Diagnosis not present

## 2019-11-29 MED ORDER — ALBUTEROL SULFATE HFA 108 (90 BASE) MCG/ACT IN AERS: 2.0000 | INHALATION_SPRAY | Freq: Four times a day (QID) | RESPIRATORY_TRACT | 3 refills | Status: DC | PRN

## 2019-11-29 MED ORDER — TRELEGY ELLIPTA 100-62.5-25 MCG/INH IN AEPB: 1.0000 | INHALATION_SPRAY | Freq: Every day | RESPIRATORY_TRACT | 0 refills | Status: DC

## 2019-11-29 NOTE — Assessment & Plan Note (Addendum)
Stay on 2 L oxygen daytime and 3 L at night Not compliant with BiPAP -therefore not a good candidate for Trilogy

## 2019-11-29 NOTE — Progress Notes (Signed)
   Subjective:    Patient ID: Courtney Terry, female    DOB: [birth year], 59 y.o.   MRN: [id number]  HPI  59  Yo smoker for follow-up of severe COPD and chronic respiratory failure on home oxygen   PMH - substance abuse/cocaine and severe protein calorie malnutrition,schizophrenia  She had prolonged hospitalization in 11/2018, COPD exacerbation, cocaine positive, required tracheostomy and was eventually decannulated .  Last OV with me 07/2019, we change her from [facility] to  [facility] admit 08/2019 for acute hypercarbic respiratory failure, improved with BiPAP-did not qualify for trilogy device.  Discharged with home BiPAP which she was not using  Reviewed hospital discharge summary and ED visit  Given short course of prednisone 11/12/2019 on televisit ED visit 11/15/2019 reviewed for chest pain while breathing , given Percocet and Lidoderm, CT angiogram negative for PE She asked me if I can give her pain medications today   Significant tests/ events reviewed  Alpha-1- MM  Spirometry 10/2016>>FVC 1.79 (51%],FEV1 0.69 (25%), ratio39  Echo 11/2017 RVSP 48  CT chest 2/2019Scattered predominantly subpleural nodules in the lungs  CT angiogram 11/15/2019 centrilobular emphysema, nodular opacity smaller than previous study, T7 wedging  Review of Systems neg for any significant sore throat, dysphagia, itching, sneezing, nasal congestion or excess/ purulent secretions, fever, chills, sweats, unintended wt loss, pleuritic or exertional cp, hempoptysis, orthopnea pnd or change in chronic leg swelling. Also denies presyncope, palpitations, heartburn, abdominal pain, nausea, vomiting, diarrhea or change in bowel or urinary habits, dysuria,hematuria, rash, arthralgias, visual complaints, headache, numbness weakness or ataxia.     Objective:   Physical Exam   Gen. Pleasant, well-nourished, in no distress, wheelchair, anxious affect ENT - no thrush, no pallor/icterus,no post  nasal drip Neck: No JVD, no thyromegaly, no carotid bruits Lungs: no use of accessory muscles, no dullness to percussion, decreased without rales or rhonchi  Cardiovascular: Rhythm regular, heart sounds  normal, no murmurs or gallops, no peripheral edema Musculoskeletal: No deformities, no cyanosis or clubbing         Assessment & Plan:

## 2019-11-29 NOTE — Assessment & Plan Note (Addendum)
Referral to transplant center -[facility] prednisone 10 mg. Refills on Trelegy Use albuterol MDI or nebs every 6 hours as needed for shortness of breath She is certainly getting worse with repeated hospital admissions  Explained to her that she would not be a good transplant candidate for several reasons -mental health issues, lack of support, pain medication seeking behavior etc. -She has been off cigarettes for 4 months, check urine cotinine to confirm -Last cocaine use was 11/2018, check urine drug screen She still insists on referral, will refer her to [facility]

## 2019-11-29 NOTE — Patient Instructions (Signed)
Referral to transplant center [facility] prednisone 10 mg. Refills on Trelegy Use albuterol MDI or nebs every 6 hours as needed for shortness of breath

## 2019-12-14 ENCOUNTER — Telehealth: Payer: Self-pay | Admitting: Pulmonary Disease

## 2019-12-14 NOTE — Telephone Encounter (Signed)
Called and spoke to pt. Pt states she is having an upcoming breast surgery and the surgeons office should be calling us. Will keep encounter open for when surgeons office calls.

## 2019-12-30 ENCOUNTER — Telehealth: Payer: Self-pay | Admitting: Pulmonary Disease

## 2019-12-30 MED ORDER — PREDNISONE 10 MG PO TABS: 10.0000 mg | ORAL_TABLET | Freq: Every day | ORAL | 2 refills | Status: DC

## 2019-12-30 NOTE — Telephone Encounter (Signed)
PT calling about this . Please return call. -- pt is waiting at pharmacy

## 2019-12-30 NOTE — Telephone Encounter (Signed)
Per Dr. Bari Mantis note it says to continue Prednisone. I have sent in a refill and made patient aware.

## 2020-01-13 ENCOUNTER — Telehealth: Payer: Self-pay | Admitting: *Deleted

## 2020-01-13 ENCOUNTER — Telehealth: Payer: Self-pay | Admitting: Pulmonary Disease

## 2020-01-13 NOTE — Telephone Encounter (Signed)
TOC CM received call from patient and states Lidocaine patches are not covered by her insurance. Requires a prior auth. TOC CM checked Medicaid List for [place] and it is non preferred medications. Sent pt a goodrx coupon for $37 to pick and pay out of pocket. ED provider sent message. [place], [place] ED TOC CM [phone]

## 2020-01-13 NOTE — Telephone Encounter (Signed)
Spoke with pt and she is requesting refills on Trelegy and Rescue inhaler. Pt states that she doesn't want Albuterol she want the "blue one" which would be Ventolin. I spoke with pharmacy at [facility] and they state that pt has current refills on both Trelegy and Albuterol inhaler and that Medicaid will only pay or generic and will not pay for Ventolin. Spoke with pt and advised of what pharmacy stated and let pt know that refills for both Trelegy and Albuterol were ready for pick up at pharmacy. Pt verbalized understanding.

## 2020-01-14 ENCOUNTER — Telehealth: Payer: Self-pay | Admitting: Internal Medicine

## 2020-01-14 NOTE — Telephone Encounter (Signed)
Got message from answering service pt wants to change her 02 to a more portable system - called and got answering machine and told pt we could work on this during the week during office hours  Please call her back during office hours and see if we can help and ok to order best fit eval or contact Dr Elsworth Soho for any further questions re her 02 rx

## 2020-01-17 NOTE — Telephone Encounter (Signed)
Spoke with pt, she is requesting a POC. I scheduled her a walk on 01/24/2020 at 2 pm to qualify for POC. I explained to her that we needed to see if she could use pulse oxygen instead of continuous. Pt understood and nothing further is needed.
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
## 2020-01-21 ENCOUNTER — Inpatient Hospital Stay: Payer: Medicaid Other | Attending: Physician Assistant

## 2020-01-21 ENCOUNTER — Telehealth: Payer: Self-pay | Admitting: Physician Assistant

## 2020-01-21 ENCOUNTER — Inpatient Hospital Stay: Payer: Medicaid Other | Admitting: Physician Assistant

## 2020-01-21 NOTE — Telephone Encounter (Signed)
Called the patient because she did not come to her appointment today. She states she forgot and she is unable to make it to the clinic today. Told her I would send a message to scheduling to reschedule her appointment. Discussed scheduling would call her to confirm her new appointment time. She expressed understanding.

## 2020-01-22 ENCOUNTER — Emergency Department (HOSPITAL_COMMUNITY)
Admission: EM | Admit: 2020-01-22 | Discharge: 2020-01-22 | Disposition: A | Discharge status: Home / Self Care | Payer: Medicaid Other | Attending: Emergency Medicine | Admitting: Emergency Medicine

## 2020-01-22 ENCOUNTER — Emergency Department (HOSPITAL_COMMUNITY): Payer: Medicaid Other

## 2020-01-22 ENCOUNTER — Encounter (HOSPITAL_COMMUNITY): Payer: Self-pay

## 2020-01-22 ENCOUNTER — Ambulatory Visit: Payer: Medicaid Other | Attending: Internal Medicine

## 2020-01-22 ENCOUNTER — Other Ambulatory Visit: Payer: Self-pay

## 2020-01-22 DIAGNOSIS — Z87891 Personal history of nicotine dependence: Secondary | ICD-10-CM | POA: Insufficient documentation

## 2020-01-22 DIAGNOSIS — I10 Essential (primary) hypertension: Secondary | ICD-10-CM | POA: Diagnosis not present

## 2020-01-22 DIAGNOSIS — E039 Hypothyroidism, unspecified: Secondary | ICD-10-CM | POA: Insufficient documentation

## 2020-01-22 DIAGNOSIS — Z79899 Other long term (current) drug therapy: Secondary | ICD-10-CM | POA: Diagnosis not present

## 2020-01-22 DIAGNOSIS — I493 Ventricular premature depolarization: Secondary | ICD-10-CM

## 2020-01-22 DIAGNOSIS — Z23 Encounter for immunization: Secondary | ICD-10-CM

## 2020-01-22 DIAGNOSIS — R008 Other abnormalities of heart beat: Secondary | ICD-10-CM | POA: Diagnosis present

## 2020-01-22 DIAGNOSIS — J449 Chronic obstructive pulmonary disease, unspecified: Secondary | ICD-10-CM | POA: Diagnosis not present

## 2020-01-22 LAB — CBC
HCT: 46 % (ref 36.0–46.0)
Hemoglobin: 13.2 g/dL (ref 12.0–15.0)
MCH: 26.4 pg (ref 26.0–34.0)
MCHC: 28.7 g/dL — ABNORMAL LOW (ref 30.0–36.0)
MCV: 92 fL (ref 80.0–100.0)
Platelets: 303 10*3/uL (ref 150–400)
RBC: 5 MIL/uL (ref 3.87–5.11)
RDW: 14.9 % (ref 11.5–15.5)
WBC: 17.4 10*3/uL — ABNORMAL HIGH (ref 4.0–10.5)
nRBC: 0 % (ref 0.0–0.2)

## 2020-01-22 LAB — BASIC METABOLIC PANEL
Anion gap: 10 (ref 5–15)
BUN: 16 mg/dL (ref 6–20)
CO2: 28 mmol/L (ref 22–32)
Calcium: 9.2 mg/dL (ref 8.9–10.3)
Chloride: 101 mmol/L (ref 98–111)
Creatinine, Ser: 0.5 mg/dL (ref 0.44–1.00)
GFR calc Af Amer: >60 mL/min (ref >60)
GFR calc non Af Amer: >60 mL/min (ref >60)
Glucose, Bld: 89 mg/dL (ref 70–99)
Potassium: 4.1 mmol/L (ref 3.5–5.1)
Sodium: 139 mmol/L (ref 135–145)

## 2020-01-22 LAB — MAGNESIUM: Magnesium: 2 mg/dL (ref 1.7–2.4)

## 2020-01-22 LAB — TROPONIN I (HIGH SENSITIVITY): Troponin I (High Sensitivity): 3 ng/L (ref <18)

## 2020-01-22 MED ORDER — SODIUM CHLORIDE 0.9% FLUSH
3.0000 mL | Freq: Once | INTRAVENOUS | Status: AC
  Administered 2020-01-22: 3 mL via INTRAVENOUS

## 2020-01-22 MED ORDER — METOPROLOL TARTRATE 25 MG PO TABS: 12.5000 mg | ORAL_TABLET | Freq: Two times a day (BID) | ORAL | 0 refills | Status: DC

## 2020-01-22 MED ORDER — ALBUTEROL SULFATE HFA 108 (90 BASE) MCG/ACT IN AERS
4.0000 | INHALATION_SPRAY | Freq: Once | RESPIRATORY_TRACT | Status: AC
  Administered 2020-01-22: 4 via RESPIRATORY_TRACT
  Filled 2020-01-22: qty 6.7

## 2020-01-22 NOTE — Discharge Instructions (Signed)
You are being prescribed metoprolol for your PVCs or skipped heartbeats.  If you develop dizziness, lightheadedness, or any other new/concerning symptoms then return to the ER or call your primary care physician.
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
## 2020-01-22 NOTE — Progress Notes (Signed)
   Covid-19 Vaccination Clinic  Name:  Courtney Terry    MRN: [id number] DOB: [birth year]  01/22/2020  Courtney Terry was observed post Covid-19 immunization for 15 minutes .  During the observation period @ 12Noon she experienced an adverse reaction with the following symptoms: labored breathing and anxious. O2 currently on 1.5L/min via [place]. Ordered dose is 2L/min instructed to increase to 2L/min. B/P 122/65. Pt. Is A&Ox4. O2 sat on 2L is 99-100%. Heart rate irregular 30's-100's. Denies dizziness, CP and SOB. 12:05pm- EMS arrived at pt side, he was updated. He states he has capability to do an EKG.  Pt placed in wheelchair and moved to EMS observation area.  12:08pm- Obtaining EKG and vital signs by Courtney Terry, EMS.  12:10pm- EKG shows [place] and trigeminy PVCs. EMS discussed with pt and suggested she be transported to hospital for further evaluation. She agreed. Brother who accompanied pt was made aware. He was going to leave by their arranged transport Courtney Terry and retreive her walker off the [place]. Pt agreed with the plan.  12:11pm- EMS [facility] for EMS transport.  12:30pm- EMS arrived for transport.  .  Actions taken:  EMS on site and hand off completed to Paramedic (Name [place] ). VAERS form completed Allergy documented per pt as NKDA.    Medications administered: No medication administered.  Disposition:Patient evaluated and transferred to the hospital for further evaluation and care. Time of transport: 1230pm.   Immunizations Administered    Name Date Dose VIS Date Route   Pfizer COVID-19 Vaccine 01/22/2020 11:50 AM 0.3 mL 09/17/2019 Intramuscular   Manufacturer: [place]   Lot: [id number]   [place]: [id number]

## 2020-01-22 NOTE — ED Triage Notes (Signed)
Pt arrived via [facility] from the vaccination clinic d/t the nurses at the clinic evaluating pt & noticing that she has an irregular trigeminy Heart Beat. Pt is A/Ox4 upon arrival & reports to distress or pain. She is sating 98% on 3L O2 (which is her baseline), denies CP &/or n/v. V/S: 132/78, 107 bpm, 97.1 T.

## 2020-01-24 ENCOUNTER — Other Ambulatory Visit: Payer: Self-pay

## 2020-01-24 ENCOUNTER — Telehealth: Payer: Self-pay | Admitting: Pulmonary Disease

## 2020-01-24 ENCOUNTER — Ambulatory Visit (INDEPENDENT_AMBULATORY_CARE_PROVIDER_SITE_OTHER): Payer: Medicaid Other

## 2020-01-24 DIAGNOSIS — J449 Chronic obstructive pulmonary disease, unspecified: Secondary | ICD-10-CM | POA: Diagnosis not present

## 2020-01-24 DIAGNOSIS — J9611 Chronic respiratory failure with hypoxia: Secondary | ICD-10-CM | POA: Diagnosis not present

## 2020-01-24 MED ORDER — TRELEGY ELLIPTA 100-62.5-25 MCG/INH IN AEPB: 1.0000 | INHALATION_SPRAY | Freq: Every day | RESPIRATORY_TRACT | 0 refills | Status: DC

## 2020-01-24 NOTE — Progress Notes (Signed)
Tried pt out on 2pulse as she states that she usually uses about 2L continuous flow. On 2pulse, pt's sats were 87% so bumped pt up to 3pulse before the qualifying walk even began. Forehead probe was used during the walk. Pt was satting at 98% on 3pulse. Pt was only able to walk 1 lap due to pain and from the pain, pt's sats dropped quickly to 77%. Pt ended up having to be bumped up to 5pulse to get her O2 sats up to 89%. Pt needs to remain on continuous O2.[name] Kitchen Routing to Dr. Elsworth Soho.

## 2020-01-24 NOTE — Telephone Encounter (Signed)
Samples given to pt after the walk. Nothing further needed.

## 2020-01-24 NOTE — Telephone Encounter (Signed)
Walk test today was faxed to the number that the pt provided  Nothing further needed

## 2020-01-25 ENCOUNTER — Telehealth: Payer: Self-pay | Admitting: Nurse Practitioner

## 2020-01-25 NOTE — Telephone Encounter (Signed)
Called pt to confirm r/s from 4/16 sch message- pt aware of new appt date and time

## 2020-01-27 ENCOUNTER — Telehealth: Payer: Self-pay | Admitting: *Deleted

## 2020-01-27 NOTE — Telephone Encounter (Signed)
Called and left a voicemail for the patient in regards to her first [place] vaccine that she received on 01/22/2020. Asked for the patient to return call to discuss how she is feeling and if she has any questions or concerns. Will Follow Up.

## 2020-02-01 ENCOUNTER — Other Ambulatory Visit: Payer: Medicaid Other

## 2020-02-01 ENCOUNTER — Ambulatory Visit: Payer: Medicaid Other | Admitting: Nurse Practitioner
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
## 2020-02-03 ENCOUNTER — Inpatient Hospital Stay: Payer: Medicaid Other | Admitting: Nurse Practitioner

## 2020-02-03 ENCOUNTER — Inpatient Hospital Stay: Payer: Medicaid Other

## 2020-02-03 ENCOUNTER — Telehealth: Payer: Self-pay | Admitting: Nurse Practitioner

## 2020-02-03 ENCOUNTER — Telehealth: Payer: Self-pay | Admitting: *Deleted

## 2020-02-03 NOTE — Telephone Encounter (Signed)
Scheduled apt per 4/29 sch message - pt aware of appt date and time

## 2020-02-03 NOTE — Telephone Encounter (Signed)
Called pt about missed appt for today. Pt had forgotten about appt. Would like to reschedule for next month some time. Scheduling message sent for rescheduling.

## 2020-02-04 NOTE — Telephone Encounter (Signed)
Letter mailed
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
## 2020-02-15 ENCOUNTER — Telehealth: Payer: Self-pay | Admitting: Pulmonary Disease

## 2020-02-15 MED ORDER — ALBUTEROL SULFATE HFA 108 (90 BASE) MCG/ACT IN AERS: 2.0000 | INHALATION_SPRAY | Freq: Four times a day (QID) | RESPIRATORY_TRACT | 1 refills | Status: DC | PRN

## 2020-02-15 NOTE — Telephone Encounter (Signed)
Spoke with pt and advised rx sent to pharmacy. Nothing further is needed.   

## 2020-02-16 ENCOUNTER — Ambulatory Visit: Payer: Medicaid Other | Attending: Internal Medicine

## 2020-02-16 DIAGNOSIS — Z23 Encounter for immunization: Secondary | ICD-10-CM

## 2020-02-16 NOTE — Progress Notes (Signed)
   Covid-19 Vaccination Clinic  Name:  Courtney Terry    MRN: [id number] DOB: 1961/02/21  02/16/2020  [name] was observed post Covid-19 immunization for 15 minutes without incident. She was provided with Vaccine Information Sheet and instruction to access the V-Safe system.   [name] was instructed to call 911 with any severe reactions post vaccine: [name] Kitchen Difficulty breathing  . Swelling of face and throat  . A fast heartbeat  . A bad rash all over body  . Dizziness and weakness   Immunizations Administered    Name Date Dose VIS Date Route   Pfizer COVID-19 Vaccine 02/16/2020  9:32 AM 0.3 mL 12/01/2018 Intramuscular   Manufacturer: [place]   Lot: [id number]   [place]: [id number]

## 2020-02-17 ENCOUNTER — Inpatient Hospital Stay: Payer: Medicaid Other | Admitting: Nurse Practitioner

## 2020-02-17 ENCOUNTER — Telehealth: Payer: Self-pay | Admitting: Pulmonary Disease

## 2020-02-17 ENCOUNTER — Inpatient Hospital Stay: Payer: Medicaid Other

## 2020-02-17 NOTE — Telephone Encounter (Signed)
Pt requesting sx clearance for breast revision sx.  Sx date not yet scheduled, pending sx clearance from our office.    Called surgeon's office, states that they just need a letter from our office stating that patient is cleared from a pulmonary standpoint to undergo general anesthesia for breast revision sx.   Fax letter to [phone] to Dr. Para Skeans office.   RA please advise on sx clearance.  Thanks!

## 2020-02-18 ENCOUNTER — Other Ambulatory Visit: Payer: Self-pay | Admitting: Pulmonary Disease

## 2020-02-18 ENCOUNTER — Telehealth: Payer: Self-pay | Admitting: Hematology

## 2020-02-18 ENCOUNTER — Telehealth: Payer: Self-pay | Admitting: Pulmonary Disease

## 2020-02-18 NOTE — Telephone Encounter (Signed)
Called and spoke with patient about Dr. Bari Mantis recs. She said that she would find a doctor that would clear her for this surgery because she needed it done and was tired of walking around with different size boobs. I expressed understanding to patient and told patient that we needed to set up a follow up visit with her but at this time Dr. Elsworth Soho was not going to clear her for surgery unless it was lifesaving. Patient hung up on me.  Patient then called back and told front staff that she wanted an appointment scheduled with Dr.Alva so that he could tell her himself that he was not going to clear her. She was offered an appointment with him on 6/16 and she didn't want that one she stated that she was trying to get this surgery done before the end of the month. Expressed understanding again and she then said she wanted to talk to him now informed her that he was not here and that it wasn't an option right now. Patient hung up

## 2020-02-18 NOTE — Telephone Encounter (Signed)
Called and spoke with pt letting her know the info stated by RA that the decision for her to have the surgery would be between her and surgeon as RA stated that he can simply comment that she would be at mod-high risk for complications but if she wants to take the risk, she could discuss this with her surgeon.  Pt asked me if I would call Dr. Para Skeans office and state to them the info from RA and I stated to her that I would.  Called Dr. Gerhard Munch office and spoke with her surgical nurse Naida Sleight letting her know the info stated by RA and she stated that Dr. Leland Johns is well aware of pt and that they will not do surgery on pt at this time. Nothing further needed.

## 2020-02-18 NOTE — Telephone Encounter (Signed)
I have discussed with her plastic surgeon She would be high risk for any kind of surgery under general anesthesia.  Best to avoid surgery unless this is lifesaving or absolutely indicated Please arrange for routine 35-month office visit with APP, last seen February 2021

## 2020-02-18 NOTE — Telephone Encounter (Signed)
Pt has already been spoken to about this in another encounter. Closing this encounter.

## 2020-02-18 NOTE — Telephone Encounter (Signed)
R/s appt per 5/13 sch message - pt aware of new appt date and time

## 2020-02-18 NOTE — Telephone Encounter (Signed)
Clarification -the decision about surgery is between her and her surgeon.  I can simply comment that she would be at moderate to high risk for postoperative pulmonary complications.  If she is willing to take that risk, she can discuss that with her surgeon and proceed with surgery. For any further discussion regarding this, please arrange office visit

## 2020-02-22 ENCOUNTER — Telehealth: Payer: Self-pay | Admitting: Pulmonary Disease

## 2020-02-22 NOTE — Telephone Encounter (Signed)
Spoke with the pt  She is asking for Dr Elsworth Soho to write and fax letter to Dr Irene Limbo stating she is cleared for breast surgery  She states she needs surgery to fix her breast implants that are leaking  Please advise, thanks

## 2020-02-23 NOTE — Telephone Encounter (Signed)
Called and spoke with pt restating to her the info from Dr. Elsworth Soho and while speaking to her, she hung up on me. Nothing further needed.

## 2020-02-23 NOTE — Telephone Encounter (Signed)
Please review recommendations from previous phone note
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
## 2020-03-20 ENCOUNTER — Inpatient Hospital Stay: Payer: Medicaid Other | Admitting: Nurse Practitioner

## 2020-03-20 ENCOUNTER — Telehealth: Payer: Self-pay | Admitting: Nurse Practitioner

## 2020-03-20 ENCOUNTER — Telehealth: Payer: Self-pay

## 2020-03-20 ENCOUNTER — Inpatient Hospital Stay: Payer: Medicaid Other

## 2020-03-20 NOTE — Telephone Encounter (Signed)
TC to pt to check on her since she was running late for her appointments. Spoke with patient who stated that she forgot about her appointments today. Sent schedule message to get her appointment rescheduled.

## 2020-03-20 NOTE — Telephone Encounter (Signed)
R/s appt per 6/14 sch message - pt is aware of new appt date and time

## 2020-04-05 ENCOUNTER — Inpatient Hospital Stay: Payer: Medicaid Other

## 2020-04-05 ENCOUNTER — Inpatient Hospital Stay: Payer: Medicaid Other | Attending: Physician Assistant | Admitting: Nurse Practitioner

## 2020-04-06 ENCOUNTER — Telehealth: Payer: Self-pay | Admitting: Nurse Practitioner

## 2020-04-06 NOTE — Telephone Encounter (Signed)
R/s appt per 6/30 sch message - pt aware of new appt date and time

## 2020-04-11 ENCOUNTER — Telehealth: Payer: Self-pay | Admitting: Pulmonary Disease

## 2020-04-11 NOTE — Telephone Encounter (Signed)
Spoke with pt and advised her to call her PCP for refill of thyroid medication because we do not have any recent labwork checking her thyroid levels. Pt verbalized understanding. Nothing further needed.

## 2020-04-12 ENCOUNTER — Inpatient Hospital Stay: Payer: Medicaid Other | Admitting: Nurse Practitioner

## 2020-04-12 ENCOUNTER — Inpatient Hospital Stay: Payer: Medicaid Other
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
## 2020-04-17 ENCOUNTER — Telehealth: Payer: Self-pay | Admitting: Pulmonary Disease

## 2020-04-17 MED ORDER — PREDNISONE 10 MG PO TABS: 10.0000 mg | ORAL_TABLET | Freq: Every day | ORAL | 2 refills | Status: DC

## 2020-04-17 NOTE — Telephone Encounter (Signed)
Dr. Elsworth Soho, please advise if you are okay with [place] refilling pt's prednisone.

## 2020-04-17 NOTE — Telephone Encounter (Signed)
Called and spoke with Patient.  Dr Bari Mantis recommendations given.  Understanding stated.  Patient stated she could not come into office for a follow up in 2 weeks, because she is having upcoming surgery. Patient did except tele phone visit scheduled 05/02/20 at 2pm with Beth,NP. Prednisone prescription sent to requested [facility]. Nothing further at this time.

## 2020-04-17 NOTE — Telephone Encounter (Signed)
Okay to refill. She needs follow-up with APP next 2 weeks

## 2020-04-18 ENCOUNTER — Telehealth: Payer: Self-pay | Admitting: Nurse Practitioner

## 2020-04-18 NOTE — Telephone Encounter (Signed)
Called pt per 7/7 sch msg - no answer. Left message for patient to call back to reschedule appt.

## 2020-04-19 NOTE — H&P (Signed)
Subjective:     Patient ID: Courtney Terry is a 59 y.o. female.  Follow-up  Returns for follow up discussion prior to planned removal left breast implant.  She had hospital admission Feb 2020 for COPD exacerbation requiring intubation and eventual tracheostomy. Positive for cocaine on admission. This was followed by Rehab admission.  Additional hospital admission 08/2019. Since last exam one ED visit for PVC. Her pulmonologist has recommended no surgery under GA unless urgent. Remains on continuous O2, chronic prednisone.  Weight has fluctuated from 97 lb 1 year ago to 203 lb today 04/2020. She quit smoking 06/2019.   History significant for right NSM and TE reconstruction. Following implant exchange and left augmentation for symmetry, course complicated by gross exposure right breast implant and left breast MRSA infection, and underwent bilateral explant.   Represented with palpable mass that has been growing for 6 months. MMG/US 9 mm palpable mass in the 10 o'clock position of the right breast, 2 cm from the nipple, 6 mm intramammary node or cyst in the 10 o'clock position of the right breast, 1 cm from the nipple, No right axillary adenopathy. ER+/PR- Her 2 -. 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 Nodes negative. Oncotype 30 r. [facility] did not strongly recommend chemotherapy given pshycological issues and patient declined chemotherapy as well.   History significant for diagnosis of right breast cancer 2013 and underwent SLN/lumpectomy. 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 ER+/PR- Her 2 neg. Oncotype score 25 at that time. Patient also referred to genetics and Rad Onc at that time. Patient stated that she was never told she needed radiation, also does not remember results of genetics test. Chart review states done at [facility] and was negative (no panel specified).   Prior 34 B. Mastectomy weight 113 g  Last MMG 11/04/19 normal. Canceled rescheduled  Oncology follow up visit for this month.  On anastrazole.  PMH includes polysubstance abuse, COPD O2 dependent, bipolar, schizophrenia, seizure history. Per patient states today has no bipolar history. Denies any cocaine use since Feb 2020 admission.  Review of Systems     Objective:   Physical Exam  Cardiovascular: Normal rate, regular rhythm and normal heart sounds.  Pulmonary/Chest: Effort normal and breath sounds normal.   Chest: no masses bilateral, soft tissue ptosis over implant on left Axillae benign > 6 cm oft tissue pinch over implant on left Sn to nipple R 19 L 25 Nipple to IMF R 9 L 11   Assessment:     Right breast cancer - recurrent  S/p NSM, TE reconstruction (serratus fascia/subpectoral) S/p implant exchange right, left augmentation with saline S/p removal bilateral breast implants, MRSA culture from left breast cavity S/p re placement bilateral saline implants S/p implant exchange right for exposure S/p removal right breast implant S/p right latissimus, TE reconstruction  S/p removal expander and placement saline implant right  Plan:      Plan removal left breast implant under sedation and local.   Patient reports pain over left breast and "wants it out" regarding implant.  Cannot assure her implant removal will change the pain she reports over left breast. We reviewed asymmetry with increased volume and ptosis over left breast in setting the significant weight changes she has experienced. I reviewed the size of her left breast implant is very small in relation to current breast volume, and she she will still have large breast on left with significant asymmetry.  She is high risk complications given her  chronic steroids, COPD. I have refused to perform any procedure that required GA unless urgent. The surgery is scheduled under MAC. This is still to be reviewed by anesthesia. Her PAT appt is tomorrow, which she states she did not know about, and we  provided that information to her. Informed patient plainly that if anesthesia does not feel comfortable with sedation and local procedure then we will not be having surgery.   Plan overnight stay for observation. No drains anticipated.    RIGHT [place] Smooth Round Moderate Profile Saline 225 ml implant placed, fill volume 250 ml. REF [phone] LEFT [place] Smooth Round Moderate Profile 175 ml saline implant placed, fill volume 175 ml REF [phone]

## 2020-04-19 NOTE — Progress Notes (Signed)
Your procedure is scheduled on Monday, July 19th.  Report to [facility] Main Entrance "A" at 8:45 A.M., and check in at the Admitting office.  Call this number if you have problems the morning of surgery:  [phone]  Call [phone] if you have any questions prior to your surgery date Monday-Friday 8am-4pm   Remember:  Do not eat after midnight the night before your surgery  You may drink clear liquids until 7:45 A.M. the morning of your surgery.   Clear liquids allowed are: Water, Non-Citrus Juices (without pulp), Carbonated Beverages, Clear Tea, Black Coffee Only, and Gatorade    Take these medicines the morning of surgery with A SIP OF WATER  anastrozole (ARIMIDEX)  divalproex (DEPAKOTE ER) Fluticasone-Umeclidin-Vilant (TRELEGY ELLIPTA)/inhaler levothyroxine (SYNTHROID, LEVOTHROID) metoprolol tartrate (LOPRESSOR) omeprazole (PRILOSEC) predniSONE (DELTASONE)  If needed - clonazePAM (KLONOPIN), oxyCODONE-acetaminophen (PERCOCET/ROXICET)   albuterol (VENTOLIN HFA)/inhaler - bring with you the morning of surgery   As of today, STOP taking any Aspirin (unless otherwise instructed by your surgeon) Aleve, Naproxen, Ibuprofen, Motrin, Advil, Goody's, BC's, all herbal medications, fish oil, and all vitamins.                     Do not wear jewelry, make up, or nail polish            Do not wear lotions, powders, perfumes, or deodorant.            Do not shave 48 hours prior to surgery.              Do not bring valuables to the hospital.            [facility] is not responsible for any belongings or valuables.  Do NOT Smoke (Tobacco/Vaping) or drink Alcohol 24 hours prior to your procedure If you use a CPAP at night, you may bring all equipment for your overnight stay.   Contacts, glasses, dentures or bridgework may not be worn into surgery.      For patients admitted to the hospital, discharge time will be determined by your treatment team.   Patients discharged the day of  surgery will not be allowed to drive home, and someone needs to stay with them for 24 hours.  Special instructions:   [facility]- Preparing For Surgery  Before surgery, you can play an important role. Because skin is not sterile, your skin needs to be as free of germs as possible. You can reduce the number of germs on your skin by washing with CHG (chlorahexidine gluconate) Soap before surgery.  CHG is an antiseptic cleaner which kills germs and bonds with the skin to continue killing germs even after washing.    Oral Hygiene is also important to reduce your risk of infection.  Remember - BRUSH YOUR TEETH THE MORNING OF SURGERY WITH YOUR REGULAR TOOTHPASTE  Please do not use if you have an allergy to CHG or antibacterial soaps. If your skin becomes reddened/irritated stop using the CHG.  Do not shave (including legs and underarms) for at least 48 hours prior to first CHG shower. It is OK to shave your face.  Please follow these instructions carefully.   1. Shower the NIGHT BEFORE SURGERY and the MORNING OF SURGERY with CHG Soap.   2. If you chose to wash your hair, wash your hair first as usual with your normal shampoo.  3. After you shampoo, rinse your hair and body thoroughly to remove the shampoo.  4. Use CHG as  you would any other liquid soap. You can apply CHG directly to the skin and wash gently with a scrungie or a clean washcloth.   5. Apply the CHG Soap to your body ONLY FROM THE NECK DOWN.  Do not use on open wounds or open sores. Avoid contact with your eyes, ears, mouth and genitals (private parts). Wash Face and genitals (private parts)  with your normal soap.   6. Wash thoroughly, paying special attention to the area where your surgery will be performed.  7. Thoroughly rinse your body with warm water from the neck down.  8. DO NOT shower/wash with your normal soap after using and rinsing off the CHG Soap.  9. Pat yourself dry with a CLEAN TOWEL.  10. Wear CLEAN PAJAMAS  to bed the night before surgery  11. Place CLEAN SHEETS on your bed the night of your first shower and DO NOT SLEEP WITH PETS.  Day of Surgery: Wear Clean/Comfortable clothing the morning of surgery Do not apply any deodorants/lotions.   Remember to brush your teeth WITH YOUR REGULAR TOOTHPASTE.   Please read over the following fact sheets that you were given.
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
## 2020-04-20 ENCOUNTER — Encounter (HOSPITAL_COMMUNITY): Payer: Self-pay

## 2020-04-20 ENCOUNTER — Other Ambulatory Visit: Payer: Self-pay

## 2020-04-20 ENCOUNTER — Encounter (HOSPITAL_COMMUNITY)
Admission: RE | Admit: 2020-04-20 | Discharge: 2020-04-20 | Disposition: A | Discharge status: Home / Self Care | Payer: Medicaid Other | Source: Ambulatory Visit | Attending: Plastic Surgery | Admitting: Plastic Surgery

## 2020-04-20 ENCOUNTER — Inpatient Hospital Stay (HOSPITAL_COMMUNITY): Admission: RE | Admit: 2020-04-20 | Payer: Medicaid Other | Source: Ambulatory Visit

## 2020-04-20 DIAGNOSIS — Z79899 Other long term (current) drug therapy: Secondary | ICD-10-CM | POA: Diagnosis not present

## 2020-04-20 DIAGNOSIS — F319 Bipolar disorder, unspecified: Secondary | ICD-10-CM | POA: Diagnosis not present

## 2020-04-20 DIAGNOSIS — E039 Hypothyroidism, unspecified: Secondary | ICD-10-CM | POA: Diagnosis not present

## 2020-04-20 DIAGNOSIS — Z853 Personal history of malignant neoplasm of breast: Secondary | ICD-10-CM | POA: Insufficient documentation

## 2020-04-20 DIAGNOSIS — M1990 Unspecified osteoarthritis, unspecified site: Secondary | ICD-10-CM | POA: Insufficient documentation

## 2020-04-20 DIAGNOSIS — Z9981 Dependence on supplemental oxygen: Secondary | ICD-10-CM | POA: Diagnosis not present

## 2020-04-20 DIAGNOSIS — Z9886 Personal history of breast implant removal: Secondary | ICD-10-CM | POA: Insufficient documentation

## 2020-04-20 DIAGNOSIS — Z79890 Hormone replacement therapy (postmenopausal): Secondary | ICD-10-CM | POA: Insufficient documentation

## 2020-04-20 DIAGNOSIS — F4310 Post-traumatic stress disorder, unspecified: Secondary | ICD-10-CM | POA: Diagnosis not present

## 2020-04-20 DIAGNOSIS — F209 Schizophrenia, unspecified: Secondary | ICD-10-CM | POA: Insufficient documentation

## 2020-04-20 DIAGNOSIS — Z9013 Acquired absence of bilateral breasts and nipples: Secondary | ICD-10-CM | POA: Diagnosis not present

## 2020-04-20 DIAGNOSIS — J449 Chronic obstructive pulmonary disease, unspecified: Secondary | ICD-10-CM | POA: Diagnosis not present

## 2020-04-20 DIAGNOSIS — Z7952 Long term (current) use of systemic steroids: Secondary | ICD-10-CM | POA: Insufficient documentation

## 2020-04-20 DIAGNOSIS — Z01812 Encounter for preprocedural laboratory examination: Secondary | ICD-10-CM | POA: Insufficient documentation

## 2020-04-20 HISTORY — DX: Sleep apnea, unspecified: G47.30

## 2020-04-20 LAB — CBC WITH DIFFERENTIAL/PLATELET
Abs Immature Granulocytes: 0.08 10*3/uL — ABNORMAL HIGH (ref 0.00–0.07)
Basophils Absolute: 0.1 10*3/uL (ref 0.0–0.1)
Basophils Relative: 0 %
Eosinophils Absolute: 0 10*3/uL (ref 0.0–0.5)
Eosinophils Relative: 0 %
HCT: 43.8 % (ref 36.0–46.0)
Hemoglobin: 12.5 g/dL (ref 12.0–15.0)
Immature Granulocytes: 1 %
Lymphocytes Relative: 5 %
Lymphs Abs: 0.8 10*3/uL (ref 0.7–4.0)
MCH: 25.6 pg — ABNORMAL LOW (ref 26.0–34.0)
MCHC: 28.5 g/dL — ABNORMAL LOW (ref 30.0–36.0)
MCV: 89.8 fL (ref 80.0–100.0)
Monocytes Absolute: 0.5 10*3/uL (ref 0.1–1.0)
Monocytes Relative: 3 %
Neutro Abs: 14.9 10*3/uL — ABNORMAL HIGH (ref 1.7–7.7)
Neutrophils Relative %: 91 %
Platelets: 344 10*3/uL (ref 150–400)
RBC: 4.88 MIL/uL (ref 3.87–5.11)
RDW: 15.4 % (ref 11.5–15.5)
WBC: 16.4 10*3/uL — ABNORMAL HIGH (ref 4.0–10.5)
nRBC: 0 % (ref 0.0–0.2)

## 2020-04-20 LAB — BASIC METABOLIC PANEL
Anion gap: 11 (ref 5–15)
BUN: 8 mg/dL (ref 6–20)
CO2: 31 mmol/L (ref 22–32)
Calcium: 9.7 mg/dL (ref 8.9–10.3)
Chloride: 99 mmol/L (ref 98–111)
Creatinine, Ser: 0.65 mg/dL (ref 0.44–1.00)
GFR calc Af Amer: >60 mL/min (ref >60)
GFR calc non Af Amer: >60 mL/min (ref >60)
Glucose, Bld: 127 mg/dL — ABNORMAL HIGH (ref 70–99)
Potassium: 4.3 mmol/L (ref 3.5–5.1)
Sodium: 141 mmol/L (ref 135–145)

## 2020-04-20 MED ORDER — CHLORHEXIDINE GLUCONATE CLOTH 2 % EX PADS: 6.0000 | MEDICATED_PAD | Freq: Once | CUTANEOUS | Status: DC

## 2020-04-20 NOTE — Anesthesia Preprocedure Evaluation (Addendum)
Anesthesia Evaluation  Patient identified by MRN, date of birth, ID band Patient awake    Reviewed: Allergy & Precautions, NPO status , Patient's Chart, lab work & pertinent test results  Airway Mallampati: II  TM Distance: >3 FB Neck ROM: Full    Dental no notable dental hx.    Pulmonary sleep apnea , COPD,  oxygen dependent, former smoker,    Pulmonary exam normal breath sounds clear to auscultation + decreased breath sounds      Cardiovascular hypertension, Pt. on medications Normal cardiovascular exam Rhythm:Regular Rate:Normal     Neuro/Psych negative neurological ROS  negative psych ROS   GI/Hepatic negative GI ROS, (+)     substance abuse  cocaine use,   Endo/Other  Hypothyroidism   Renal/GU negative Renal ROS  negative genitourinary   Musculoskeletal negative musculoskeletal ROS (+)   Abdominal   Peds negative pediatric ROS (+)  Hematology negative hematology ROS (+)   Anesthesia Other Findings   Reproductive/Obstetrics negative OB ROS                            Anesthesia Physical Anesthesia Plan  ASA: IV  Anesthesia Plan: MAC   Post-op Pain Management:    Induction: Intravenous  PONV Risk Score and Plan: 2 and Ondansetron and Treatment may vary due to age or medical condition  Airway Management Planned: Simple Face Mask  Additional Equipment:   Intra-op Plan:   Post-operative Plan:   Informed Consent: I have reviewed the patients History and Physical, chart, labs and discussed the procedure including the risks, benefits and alternatives for the proposed anesthesia with the patient or authorized representative who has indicated his/her understanding and acceptance.     Dental advisory given  Plan Discussed with: CRNA and Surgeon  Anesthesia Plan Comments: (See PAT note written 04/20/2020 by Myra Gianotti, PA-C. Severe COPD on 2L, asthma, HTN, breast cancer  with reconstruction (s/p 10 surgeries). Quit smoking and cocaine use 11/2018 following hospitalization for acute respiratory failure/COPD exacerbation and required trach for ~ 1 month (now decannulated). Per pulmonologist Dr. Elsworth Soho, "She would be high risk for any kind of surgery under general anesthesia. Best to avoid surgery unless this is lifesaving or absolutely indicated" and "Clarification-the decision about surgery is between her and her surgeon. I cansimply commentthat she would be at moderate to high risk for postoperative pulmonary complications. If she is willing to take that risk, she can discuss that with her surgeon and proceed with surgery."  Dr. Iran Planas requests that procedure be done under local with MAC.  )       Anesthesia Quick Evaluation

## 2020-04-20 NOTE — Progress Notes (Signed)
PCP - [facility] Harriet Pho        PUL MD   [place] [facility] Cardiologist - NA    -       Chest x-ray - 4/21 EKG - 4/21 Stress Test - NA     ECHO - 2/19 Cardiac Cath - NA  Sleep Study - YES CPAP - BI PAP  AND O2 2L     Blood Thinner Instructions:NA Aspirin Instructions:STOP  ERAS Protcol -INSTRUCTION GIVEN   COVID TEST- HOPEFULLY GOING TODAY   Anesthesia review: ALLISON PA REVIEWED  Patient denies shortness of breath, fever, cough and chest pain at PAT appointment   All instructions explained to the patient, with a verbal understanding of the material. Patient agrees to go over the instructions while at home for a better understanding. Patient also instructed to self quarantine after being tested for COVID-19. The opportunity to ask questions was provided.

## 2020-04-21 ENCOUNTER — Other Ambulatory Visit (HOSPITAL_COMMUNITY)
Admission: RE | Admit: 2020-04-21 | Discharge: 2020-04-21 | Disposition: A | Discharge status: Home / Self Care | Payer: Medicaid Other | Source: Ambulatory Visit | Attending: Plastic Surgery | Admitting: Plastic Surgery

## 2020-04-21 DIAGNOSIS — Z01812 Encounter for preprocedural laboratory examination: Secondary | ICD-10-CM | POA: Insufficient documentation

## 2020-04-21 DIAGNOSIS — Z20822 Contact with and (suspected) exposure to covid-19: Secondary | ICD-10-CM | POA: Diagnosis not present

## 2020-04-21 LAB — SARS CORONAVIRUS 2 (TAT 6-24 HRS): SARS Coronavirus 2: NEGATIVE

## 2020-04-24 ENCOUNTER — Ambulatory Visit (HOSPITAL_COMMUNITY): Payer: Medicaid Other | Admitting: Registered Nurse

## 2020-04-24 ENCOUNTER — Encounter (HOSPITAL_COMMUNITY): Payer: Self-pay | Admitting: Plastic Surgery

## 2020-04-24 ENCOUNTER — Other Ambulatory Visit: Payer: Self-pay

## 2020-04-24 ENCOUNTER — Ambulatory Visit (HOSPITAL_COMMUNITY): Payer: Medicaid Other | Admitting: Vascular Surgery

## 2020-04-24 ENCOUNTER — Observation Stay (HOSPITAL_COMMUNITY)
Admission: RE | Admit: 2020-04-24 | Discharge: 2020-04-25 | Disposition: A | Discharge status: Home / Self Care | Payer: Medicaid Other | Attending: Plastic Surgery | Admitting: Plastic Surgery

## 2020-04-24 ENCOUNTER — Encounter (HOSPITAL_COMMUNITY)
Admission: RE | Disposition: A | Discharge status: Home / Self Care | Payer: Self-pay | Source: Home / Self Care | Attending: Plastic Surgery

## 2020-04-24 DIAGNOSIS — N644 Mastodynia: Secondary | ICD-10-CM | POA: Diagnosis present

## 2020-04-24 DIAGNOSIS — J441 Chronic obstructive pulmonary disease with (acute) exacerbation: Secondary | ICD-10-CM | POA: Insufficient documentation

## 2020-04-24 DIAGNOSIS — Z9012 Acquired absence of left breast and nipple: Secondary | ICD-10-CM | POA: Diagnosis not present

## 2020-04-24 DIAGNOSIS — Z9882 Breast implant status: Secondary | ICD-10-CM | POA: Insufficient documentation

## 2020-04-24 DIAGNOSIS — Z853 Personal history of malignant neoplasm of breast: Secondary | ICD-10-CM | POA: Diagnosis not present

## 2020-04-24 DIAGNOSIS — F1910 Other psychoactive substance abuse, uncomplicated: Secondary | ICD-10-CM | POA: Diagnosis not present

## 2020-04-24 HISTORY — PX: BREAST IMPLANT REMOVAL: SHX5361

## 2020-04-24 SURGERY — REMOVAL, IMPLANT, BREAST
Anesthesia: Monitor Anesthesia Care | Site: Breast | Laterality: Left

## 2020-04-24 MED ORDER — CHLORHEXIDINE GLUCONATE 0.12 % MT SOLN
15.0000 mL | Freq: Once | OROMUCOSAL | Status: AC
  Administered 2020-04-24: 15 mL via OROMUCOSAL
  Filled 2020-04-24: qty 15

## 2020-04-24 MED ORDER — PANTOPRAZOLE SODIUM 40 MG PO TBEC: 80.0000 mg | DELAYED_RELEASE_TABLET | Freq: Every day | ORAL | Status: DC

## 2020-04-24 MED ORDER — ONDANSETRON HCL 4 MG/2ML IJ SOLN: 4.0000 mg | Freq: Four times a day (QID) | INTRAMUSCULAR | Status: DC | PRN

## 2020-04-24 MED ORDER — OXYCODONE-ACETAMINOPHEN 5-325 MG PO TABS
1.0000 | ORAL_TABLET | ORAL | Status: DC | PRN
  Administered 2020-04-24: 1 via ORAL
  Administered 2020-04-25 (×2): 2 via ORAL
  Filled 2020-04-24: qty 2
  Filled 2020-04-24: qty 1
  Filled 2020-04-24: qty 2

## 2020-04-24 MED ORDER — LIDOCAINE-EPINEPHRINE 1 %-1:100000 IJ SOLN
INTRAMUSCULAR | Status: AC
  Filled 2020-04-24: qty 1

## 2020-04-24 MED ORDER — LOSARTAN POTASSIUM 25 MG PO TABS
25.0000 mg | ORAL_TABLET | Freq: Every day | ORAL | Status: DC
  Administered 2020-04-25: 25 mg via ORAL
  Filled 2020-04-24: qty 1

## 2020-04-24 MED ORDER — PROPOFOL 10 MG/ML IV BOLUS
INTRAVENOUS | Status: DC | PRN
  Administered 2020-04-24: 30 mg via INTRAVENOUS
  Administered 2020-04-24: 20 mg via INTRAVENOUS

## 2020-04-24 MED ORDER — FENTANYL CITRATE (PF) 100 MCG/2ML IJ SOLN: 25.0000 ug | INTRAMUSCULAR | Status: DC | PRN

## 2020-04-24 MED ORDER — LIDOCAINE 2% (20 MG/ML) 5 ML SYRINGE
INTRAMUSCULAR | Status: DC | PRN
  Administered 2020-04-24: 60 mg via INTRAVENOUS

## 2020-04-24 MED ORDER — FENTANYL CITRATE (PF) 250 MCG/5ML IJ SOLN
INTRAMUSCULAR | Status: AC
  Filled 2020-04-24: qty 5

## 2020-04-24 MED ORDER — ALBUTEROL SULFATE (2.5 MG/3ML) 0.083% IN NEBU
3.0000 mL | INHALATION_SOLUTION | Freq: Four times a day (QID) | RESPIRATORY_TRACT | Status: DC | PRN
  Administered 2020-04-24: 3 mL via RESPIRATORY_TRACT
  Filled 2020-04-24: qty 3

## 2020-04-24 MED ORDER — ACETAMINOPHEN 500 MG PO TABS
1000.0000 mg | ORAL_TABLET | ORAL | Status: AC
  Administered 2020-04-24: 1000 mg via ORAL
  Filled 2020-04-24: qty 2

## 2020-04-24 MED ORDER — ORAL CARE MOUTH RINSE: 15.0000 mL | Freq: Once | OROMUCOSAL | Status: AC

## 2020-04-24 MED ORDER — LIDOCAINE 2% (20 MG/ML) 5 ML SYRINGE
INTRAMUSCULAR | Status: AC
  Filled 2020-04-24: qty 5

## 2020-04-24 MED ORDER — MIDAZOLAM HCL 5 MG/5ML IJ SOLN
INTRAMUSCULAR | Status: DC | PRN
  Administered 2020-04-24: 2 mg via INTRAVENOUS

## 2020-04-24 MED ORDER — TIZANIDINE HCL 2 MG PO TABS
4.0000 mg | ORAL_TABLET | Freq: Three times a day (TID) | ORAL | Status: DC
  Administered 2020-04-24 – 2020-04-25 (×3): 4 mg via ORAL
  Filled 2020-04-24 (×3): qty 2

## 2020-04-24 MED ORDER — DEXAMETHASONE SODIUM PHOSPHATE 10 MG/ML IJ SOLN
INTRAMUSCULAR | Status: AC
  Filled 2020-04-24: qty 1

## 2020-04-24 MED ORDER — LIDOCAINE-EPINEPHRINE 1 %-1:100000 IJ SOLN
INTRAMUSCULAR | Status: DC | PRN
Start: 2020-04-24 — End: 2020-04-24
  Administered 2020-04-24 (×2): 10 mL

## 2020-04-24 MED ORDER — PREDNISONE 10 MG PO TABS
10.0000 mg | ORAL_TABLET | Freq: Every day | ORAL | Status: DC
  Administered 2020-04-25: 10 mg via ORAL
  Filled 2020-04-24: qty 1

## 2020-04-24 MED ORDER — LACTATED RINGERS IV SOLN: INTRAVENOUS | Status: DC

## 2020-04-24 MED ORDER — ONDANSETRON HCL 4 MG/2ML IJ SOLN
INTRAMUSCULAR | Status: DC | PRN
  Administered 2020-04-24: 4 mg via INTRAVENOUS

## 2020-04-24 MED ORDER — HYDROMORPHONE HCL 1 MG/ML IJ SOLN: 0.5000 mg | INTRAMUSCULAR | Status: DC | PRN

## 2020-04-24 MED ORDER — FENTANYL CITRATE (PF) 100 MCG/2ML IJ SOLN
INTRAMUSCULAR | Status: DC | PRN
  Administered 2020-04-24 (×2): 25 ug via INTRAVENOUS

## 2020-04-24 MED ORDER — OXYCODONE-ACETAMINOPHEN 5-325 MG PO TABS: 1.0000 | ORAL_TABLET | ORAL | 0 refills | Status: DC | PRN

## 2020-04-24 MED ORDER — ONDANSETRON 4 MG PO TBDP: 4.0000 mg | ORAL_TABLET | Freq: Four times a day (QID) | ORAL | Status: DC | PRN

## 2020-04-24 MED ORDER — LEVOTHYROXINE SODIUM 75 MCG PO TABS
75.0000 ug | ORAL_TABLET | Freq: Every day | ORAL | Status: DC
  Administered 2020-04-25: 75 ug via ORAL
  Filled 2020-04-24: qty 1

## 2020-04-24 MED ORDER — CEFAZOLIN SODIUM-DEXTROSE 2-4 GM/100ML-% IV SOLN
2.0000 g | INTRAVENOUS | Status: AC
  Administered 2020-04-24: 2 g via INTRAVENOUS
  Filled 2020-04-24: qty 100

## 2020-04-24 MED ORDER — 0.9 % SODIUM CHLORIDE (POUR BTL) OPTIME
TOPICAL | Status: DC | PRN
  Administered 2020-04-24: 1000 mL

## 2020-04-24 MED ORDER — DEXAMETHASONE SODIUM PHOSPHATE 10 MG/ML IJ SOLN
INTRAMUSCULAR | Status: DC | PRN
  Administered 2020-04-24: 5 mg via INTRAVENOUS

## 2020-04-24 MED ORDER — PROPOFOL 10 MG/ML IV BOLUS
INTRAVENOUS | Status: AC
  Filled 2020-04-24: qty 20

## 2020-04-24 MED ORDER — MIDAZOLAM HCL 2 MG/2ML IJ SOLN
INTRAMUSCULAR | Status: AC
  Filled 2020-04-24: qty 2

## 2020-04-24 MED ORDER — CELECOXIB 200 MG PO CAPS
200.0000 mg | ORAL_CAPSULE | ORAL | Status: AC
  Administered 2020-04-24: 200 mg via ORAL
  Filled 2020-04-24: qty 1

## 2020-04-24 MED ORDER — ONDANSETRON HCL 4 MG/2ML IJ SOLN
INTRAMUSCULAR | Status: AC
  Filled 2020-04-24: qty 2

## 2020-04-24 MED ORDER — UMECLIDINIUM BROMIDE 62.5 MCG/INH IN AEPB
1.0000 | INHALATION_SPRAY | Freq: Every day | RESPIRATORY_TRACT | Status: DC
  Administered 2020-04-25: 1 via RESPIRATORY_TRACT
  Filled 2020-04-24: qty 7

## 2020-04-24 MED ORDER — FLUTICASONE-UMECLIDIN-VILANT 100-62.5-25 MCG/INH IN AEPB: 1.0000 | INHALATION_SPRAY | Freq: Every day | RESPIRATORY_TRACT | Status: DC

## 2020-04-24 MED ORDER — ANASTROZOLE 1 MG PO TABS
1.0000 mg | ORAL_TABLET | Freq: Every day | ORAL | Status: DC
  Administered 2020-04-25: 1 mg via ORAL
  Filled 2020-04-24: qty 1

## 2020-04-24 MED ORDER — FLUTICASONE FUROATE-VILANTEROL 100-25 MCG/INH IN AEPB
1.0000 | INHALATION_SPRAY | Freq: Every day | RESPIRATORY_TRACT | Status: DC
  Administered 2020-04-25: 1 via RESPIRATORY_TRACT
  Filled 2020-04-24: qty 28

## 2020-04-24 MED ORDER — PROPOFOL 500 MG/50ML IV EMUL
INTRAVENOUS | Status: DC | PRN
  Administered 2020-04-24: 75 ug/kg/min via INTRAVENOUS

## 2020-04-24 MED ORDER — ADULT MULTIVITAMIN W/MINERALS CH
1.0000 | ORAL_TABLET | Freq: Every day | ORAL | Status: DC
  Administered 2020-04-24 – 2020-04-25 (×2): 1 via ORAL
  Filled 2020-04-24 (×2): qty 1

## 2020-04-24 SURGICAL SUPPLY — 49 items
ADH SKN CLS APL DERMABOND .7 (GAUZE/BANDAGES/DRESSINGS) ×1
APL PRP STRL LF DISP 70% ISPRP (MISCELLANEOUS)
BAG DECANTER FOR FLEXI CONT (MISCELLANEOUS) IMPLANT
BINDER BREAST MEDIUM (GAUZE/BANDAGES/DRESSINGS) ×1 IMPLANT
BLADE CLIPPER SURG (BLADE) IMPLANT
BNDG GAUZE ELAST 4 BULKY (GAUZE/BANDAGES/DRESSINGS) IMPLANT
CANISTER SUCT 3000ML PPV (MISCELLANEOUS) ×2 IMPLANT
CHLORAPREP W/TINT 26 (MISCELLANEOUS) IMPLANT
CNTNR URN SCR LID CUP LEK RST (MISCELLANEOUS) IMPLANT
CONT SPEC 4OZ STRL OR WHT (MISCELLANEOUS)
COVER SURGICAL LIGHT HANDLE (MISCELLANEOUS) ×2 IMPLANT
COVER WAND RF STERILE (DRAPES) ×1 IMPLANT
DERMABOND ADVANCED (GAUZE/BANDAGES/DRESSINGS) ×1
DERMABOND ADVANCED .7 DNX12 (GAUZE/BANDAGES/DRESSINGS) IMPLANT
DRAPE HALF SHEET 40X57 (DRAPES) IMPLANT
DRAPE IMP U-DRAPE 54X76 (DRAPES) ×2 IMPLANT
DRAPE INCISE IOBAN 66X45 STRL (DRAPES) IMPLANT
DRAPE LAPAROTOMY 100X72 PEDS (DRAPES) ×2 IMPLANT
DRSG ADAPTIC 3X8 NADH LF (GAUZE/BANDAGES/DRESSINGS) IMPLANT
DRSG PAD ABDOMINAL 8X10 ST (GAUZE/BANDAGES/DRESSINGS) ×2 IMPLANT
DRSG VAC ATS LRG SENSATRAC (GAUZE/BANDAGES/DRESSINGS) IMPLANT
DRSG VAC ATS MED SENSATRAC (GAUZE/BANDAGES/DRESSINGS) IMPLANT
DRSG VAC ATS SM SENSATRAC (GAUZE/BANDAGES/DRESSINGS) IMPLANT
ELECT CAUTERY BLADE 6.4 (BLADE) ×2 IMPLANT
ELECT COATED BLADE 2.86 ST (ELECTRODE) ×2 IMPLANT
ELECT REM PT RETURN 9FT ADLT (ELECTROSURGICAL) ×2
ELECTRODE REM PT RTRN 9FT ADLT (ELECTROSURGICAL) ×1 IMPLANT
GAUZE SPONGE 4X4 12PLY STRL (GAUZE/BANDAGES/DRESSINGS) ×2 IMPLANT
GLOVE BIO SURGEON STRL SZ 6 (GLOVE) ×2 IMPLANT
GLOVE SURG SS PI 6.0 STRL IVOR (GLOVE) ×2 IMPLANT
GOWN STRL REUS W/ TWL LRG LVL3 (GOWN DISPOSABLE) ×2 IMPLANT
GOWN STRL REUS W/TWL LRG LVL3 (GOWN DISPOSABLE) ×4
HANDPIECE INTERPULSE COAX TIP (DISPOSABLE)
KIT BASIN OR (CUSTOM PROCEDURE TRAY) ×2 IMPLANT
KIT TURNOVER KIT B (KITS) ×2 IMPLANT
NS IRRIG 1000ML POUR BTL (IV SOLUTION) ×2 IMPLANT
PACK GENERAL/GYN (CUSTOM PROCEDURE TRAY) ×2 IMPLANT
PACK UNIVERSAL I (CUSTOM PROCEDURE TRAY) ×2 IMPLANT
PAD ABD 8X10 STRL (GAUZE/BANDAGES/DRESSINGS) ×1 IMPLANT
PAD ARMBOARD 7.5X6 YLW CONV (MISCELLANEOUS) ×4 IMPLANT
SET HNDPC FAN SPRY TIP SCT (DISPOSABLE) IMPLANT
SURGILUBE 2OZ TUBE FLIPTOP (MISCELLANEOUS) IMPLANT
SUT MNCRL AB 4-0 PS2 18 (SUTURE) ×2 IMPLANT
SUT VIC AB 4-0 PS2 18 (SUTURE) ×2 IMPLANT
SWAB COLLECTION DEVICE MRSA (MISCELLANEOUS) IMPLANT
SWAB CULTURE ESWAB REG 1ML (MISCELLANEOUS) IMPLANT
TOWEL GREEN STERILE (TOWEL DISPOSABLE) ×2 IMPLANT
TOWEL GREEN STERILE FF (TOWEL DISPOSABLE) ×2 IMPLANT
UNDERPAD 30X36 HEAVY ABSORB (UNDERPADS AND DIAPERS) ×2 IMPLANT

## 2020-04-24 NOTE — Op Note (Signed)
Operative Note   DATE OF OPERATION: 7.19.21  LOCATION: [facility] OR-observation  SURGICAL DIVISION: Plastic Surgery  PREOPERATIVE DIAGNOSES:  1. History breast cancer 2. Acquired absence breast 3. History left breast augmentation  POSTOPERATIVE DIAGNOSES:  same  PROCEDURE:  Removal left breast implant  SURGEON: Irene Limbo MD MBA  ASSISTANT: Idolina Primer RNFA  ANESTHESIA:  MAC  EBL: 10 ml  COMPLICATIONS: None immediate.   INDICATIONS FOR PROCEDURE:  The patient, Courtney Terry, is a 59 y.o. female born on [birth year], is here for removal left breast implant. Patient complains of pain over left breast. Since time of initial augmentation for symmetry with right implant based reconstruction, patient has gained over 100lb in setting severe COPD, chronic steroid use. Plan removal left breast implant under MAC.   FINDINGS: Removed intact smooth saline implant.  DESCRIPTION OF PROCEDURE:  The patient's operative site was marked with the patient in the preoperative area. The patient was taken to the operating room. SCDs were placed and IV antibiotics were given. The patient's operative site was prepped and draped in a sterile fashion. A time out was performed and all information was confirmed to be correct. Local anesthetic infiltrated along prior inframammary fold scar. Incision made through scar and carried through superficial fascia and implant capsule. Intact implant removed. Additional local anesthetic infiltrated wound base and pectoralis muscle. Examination of implant capsule noted to be thin no calcifications no fluid within capsule. Closure completed with 3-0 vicryl in superficial fascia, 4-0 vicryl in dermis, 4-0 monocryl subcuticular skin closure. Dermabond applied followed by dry dressing, breast binder.  The patient was taken to the recovery room in satisfactory condition.   SPECIMENS: none  DRAINS: none

## 2020-04-24 NOTE — Interval H&P Note (Signed)
History and Physical Interval Note:  04/24/2020 9:43 AM  Courtney Terry  has presented today for surgery, with the diagnosis of history breast ca, acquired absence right breast.  The various methods of treatment have been discussed with the patient and family. After consideration of risks, benefits and other options for treatment, the patient has consented to  Procedure(s): REMOVAL LEFT BREAST IMPLANT, POSSIBLE BILATERAL (Left) as a surgical intervention.  The patient's history has been reviewed, patient examined, no change in status, stable for surgery.  I have reviewed the patient's chart and labs.  Questions were answered to the patient's satisfaction.     Arnoldo Hooker Suan Pyeatt

## 2020-04-24 NOTE — Anesthesia Procedure Notes (Signed)
Procedure Name: MAC Date/Time: 04/24/2020 10:18 AM Performed by: Trinna Post., CRNA Pre-anesthesia Checklist: Patient identified, Emergency Drugs available, Suction available, Patient being monitored and Timeout performed Patient Re-evaluated:Patient Re-evaluated prior to induction Oxygen Delivery Method: Simple face mask Preoxygenation: Pre-oxygenation with 100% oxygen Induction Type: IV induction Placement Confirmation: positive ETCO2

## 2020-04-24 NOTE — Anesthesia Postprocedure Evaluation (Signed)
Anesthesia Post Note  Patient: Courtney Terry  Procedure(s) Performed: REMOVAL LEFT BREAST IMPLANT, POSSIBLE BILATERAL (Left Breast)     Patient location during evaluation: PACU Anesthesia Type: MAC Level of consciousness: awake and alert Pain management: pain level controlled Vital Signs Assessment: post-procedure vital signs reviewed and stable Respiratory status: spontaneous breathing, nonlabored ventilation, respiratory function stable and patient connected to nasal cannula oxygen Cardiovascular status: stable and blood pressure returned to baseline Postop Assessment: no apparent nausea or vomiting Anesthetic complications: no   No complications documented.  Last Vitals:  Vitals:   04/24/20 1115 04/24/20 1130  BP: 113/63 (!) 102/59  Pulse: 64 63  Resp: (!) 26 18  Temp:    SpO2: 100% 100%    Last Pain:  Vitals:   04/24/20 1130  TempSrc:   PainSc: 0-No pain                 Berman Grainger S

## 2020-04-24 NOTE — Progress Notes (Signed)
Received patient from PACU. Patient alert and oriented x4. No complaints of pain at this time. Left breast incision clean, dry and intact. ABD pain and breast binder in place. Instructed patient to utilize call bell when she needs assistance. Will continue to monitor.

## 2020-04-24 NOTE — Transfer of Care (Signed)
Immediate Anesthesia Transfer of Care Note  Patient: Courtney Terry  Procedure(s) Performed: REMOVAL LEFT BREAST IMPLANT, POSSIBLE BILATERAL (Left Breast)  Patient Location: PACU  Anesthesia Type:MAC  Level of Consciousness: awake, alert  and oriented  Airway & Oxygen Therapy: Patient Spontanous Breathing and Patient connected to face mask oxygen  Post-op Assessment: Report given to RN and Post -op Vital signs reviewed and stable  Post vital signs: Reviewed and stable  Last Vitals:  Vitals Value Taken Time  BP    Temp    Pulse    Resp    SpO2      Last Pain:  Vitals:   04/24/20 0856  TempSrc:   PainSc: 0-No pain      Patients Stated Pain Goal: 3 (09/40/76 8088)  Complications: No complications documented.

## 2020-04-25 ENCOUNTER — Encounter (HOSPITAL_COMMUNITY): Payer: Self-pay | Admitting: Plastic Surgery

## 2020-04-25 DIAGNOSIS — N644 Mastodynia: Secondary | ICD-10-CM | POA: Diagnosis not present

## 2020-04-25 LAB — BASIC METABOLIC PANEL
Anion gap: 6 (ref 5–15)
BUN: 16 mg/dL (ref 6–20)
CO2: 32 mmol/L (ref 22–32)
Calcium: 9.3 mg/dL (ref 8.9–10.3)
Chloride: 103 mmol/L (ref 98–111)
Creatinine, Ser: 0.68 mg/dL (ref 0.44–1.00)
GFR calc Af Amer: >60 mL/min (ref >60)
GFR calc non Af Amer: >60 mL/min (ref >60)
Glucose, Bld: 169 mg/dL — ABNORMAL HIGH (ref 70–99)
Potassium: 4.8 mmol/L (ref 3.5–5.1)
Sodium: 141 mmol/L (ref 135–145)

## 2020-04-25 LAB — CBC
HCT: 36.7 % (ref 36.0–46.0)
Hemoglobin: 10.5 g/dL — ABNORMAL LOW (ref 12.0–15.0)
MCH: 26 pg (ref 26.0–34.0)
MCHC: 28.6 g/dL — ABNORMAL LOW (ref 30.0–36.0)
MCV: 90.8 fL (ref 80.0–100.0)
Platelets: 311 10*3/uL (ref 150–400)
RBC: 4.04 MIL/uL (ref 3.87–5.11)
RDW: 15.5 % (ref 11.5–15.5)
WBC: 18.5 10*3/uL — ABNORMAL HIGH (ref 4.0–10.5)
nRBC: 0 % (ref 0.0–0.2)

## 2020-04-25 MED ORDER — METHOCARBAMOL 500 MG PO TABS: 500.0000 mg | ORAL_TABLET | Freq: Three times a day (TID) | ORAL | 0 refills | Status: DC | PRN

## 2020-04-25 MED ORDER — OXYCODONE-ACETAMINOPHEN 10-325 MG PO TABS: 1.0000 | ORAL_TABLET | Freq: Four times a day (QID) | ORAL | 0 refills | Status: DC | PRN

## 2020-04-25 NOTE — Progress Notes (Addendum)
Patient discharged to home. Verbalizes understanding of all discharge instructions including incision care, discharge medications, and follow up MD visits. Patient states her significant other is driving them home.

## 2020-04-25 NOTE — Discharge Summary (Addendum)
Physician Discharge Summary  Patient ID: JARYN HOCUTT MRN: [id number] DOB/AGE: [birth year] 58 y.o.  Admit date: 04/24/2020 Discharge date: 04/25/2020  Admission Diagnoses: History of breast cancer  Discharge Diagnoses:  Active Problems:   History of breast cancer   Discharged Condition: stable  Hospital Course: Post operatively patient able to tolerate diet and ambulatory without assist. Patient is on baseline continuous O2 and remained stable on this post operatively. Instructed on bathing. Patient received 90 day supply Percocet on 7.9.21, No additional narcotics prescribed upon discharge and patient informed.   Treatments: surgery: removal left breast implant 7.19.21  Discharge Exam: Blood pressure (!) 121/59, pulse 60, temperature 98.4 F (36.9 C), temperature source Oral, resp. rate 16, height 5\' 9"  (1.753 m), weight 92.1 kg, SpO2 100 %. Incision/Wound: incision intact with glue in place breast soft  Disposition: Discharge disposition: 01-Home or Self Care       Discharge Instructions    Call MD for:  redness, tenderness, or signs of infection (pain, swelling, bleeding, redness, odor or green/yellow discharge around incision site)   Complete by: As directed    Call MD for:  temperature >100.5   Complete by: As directed    Discharge instructions   Complete by: As directed    [place] to remove dressings and shower am 7.21.21 Soap and water ok, pat incisions dry. No creams or ointments over incisions.   Bra as desired with exception no underwire x 6 weeks post op.  Ok to raise arms above shoulders for bathing and dressing.  No house yard work or exercise until cleared by MD.   Recommend miralax or dulcolax as needed for constipation.   Lifting restrictions   Complete by: As directed    No lifting > 5 lbs through follow up visit   Resume previous diet   Complete by: As directed        Follow-up Information    Irene Limbo, MD In 1 week.   Specialty: Plastic  Surgery Why: as scheduled Contact information: [address] [place] [address] [phone]               Signed: Irene Limbo 04/25/2020, 7:09 AM

## 2020-04-27 ENCOUNTER — Telehealth: Payer: Self-pay | Admitting: Primary Care

## 2020-04-27 NOTE — Telephone Encounter (Signed)
Called and spoke with Patient.  Unsure if or why someone from [facility] called Patient.  Patient did not have a name of who called. There are no encounters, labs, or procedures from [facility].  Nothing further at this time.

## 2020-05-02 ENCOUNTER — Encounter: Payer: Self-pay | Admitting: Primary Care

## 2020-05-02 ENCOUNTER — Other Ambulatory Visit: Payer: Self-pay

## 2020-05-02 ENCOUNTER — Ambulatory Visit (INDEPENDENT_AMBULATORY_CARE_PROVIDER_SITE_OTHER): Payer: Medicaid Other | Admitting: Primary Care

## 2020-05-02 DIAGNOSIS — J449 Chronic obstructive pulmonary disease, unspecified: Secondary | ICD-10-CM | POA: Diagnosis not present

## 2020-05-02 DIAGNOSIS — J9611 Chronic respiratory failure with hypoxia: Secondary | ICD-10-CM | POA: Diagnosis not present

## 2020-05-02 NOTE — Progress Notes (Signed)
Virtual Visit via Telephone Note  I connected with Courtney Terry on 05/02/20 at  2:00 PM EDT by telephone and verified that I am speaking with the correct person using two identifiers.  Location: Patient: Home Provider: Office   I discussed the limitations, risks, security and privacy concerns of performing an evaluation and management service by telephone and the availability of in person appointments. I also discussed with the patient that there may be a patient responsible charge related to this service. The patient expressed understanding and agreed to proceed.   History of Present Illness: 59 year old, former smoker. PMH significant for COPD, chronic respiratory failure, substance abuse/cocaine, malnutrition, schizophrenia, breast cancer.  Patient of Dr. Elsworth Soho, last seen in office [place] changed to Trelegy in October 2020.   05/02/2020 Patient contacted today for 5 month follow-up COPD. She is compliant with Trelegy. States that he breathing has been ok. No recent exacerbations. She recently had removal left breast implant on 04/24/20 with plastics. Continue to wear 2L oxygen and 3L at night. She was given 90 day supply of percocet on 04/14/20 with no additional narcotics prescribed upon discharge. States that she is not actively using drugs or currently smoking.    Observations/Objective:  - Very difficult to understand patient, muffled speaking  Assessment and Plan:  COPD - Stable interval; no recent exacerbations - Continue Trelegy Ellipta one puff daily (rinse mouth after use)  Chronic respiratory failure: - Continue 2L supplemental oxygen during the day and 3L at night.   Follow Up Instructions:  -FU in 4-6 months with Dr. Elsworth Soho or sooner if needed   I discussed the assessment and treatment plan with the patient. The patient was provided an opportunity to ask questions and all were answered. The patient agreed with the plan and demonstrated an understanding of the  instructions.   The patient was advised to call back or seek an in-person evaluation if the symptoms worsen or if the condition fails to improve as anticipated.  I provided 18 minutes of non-face-to-face time during this encounter.   Martyn Ehrich, NP

## 2020-05-03 ENCOUNTER — Telehealth: Payer: Self-pay | Admitting: Adult Health

## 2020-05-03 MED ORDER — AZITHROMYCIN 250 MG PO TABS: ORAL_TABLET | ORAL | 0 refills | Status: AC

## 2020-05-03 NOTE — Telephone Encounter (Signed)
Call with complaints of 2 days of cough, congestion, thick yellow mucus . No fever or hemoptysis  . Appetite is fair with no n/v.d . O2 is at 2ll/m no increased demands Recent breast surgery .   Rec:  Zpack #1 tad  mucinex As needed  Current regimen  Please contact office for sooner follow up if symptoms do not improve or worsen or seek emergency care   Mozell Haber NP-C  [facility]    05/03/2020

## 2020-05-11 ENCOUNTER — Other Ambulatory Visit: Payer: Self-pay | Admitting: Hematology

## 2020-05-11 ENCOUNTER — Other Ambulatory Visit: Payer: Self-pay | Admitting: Pulmonary Disease

## 2020-05-11 DIAGNOSIS — C50411 Malignant neoplasm of upper-outer quadrant of right female breast: Secondary | ICD-10-CM

## 2020-05-15 ENCOUNTER — Telehealth: Payer: Self-pay | Admitting: Pulmonary Disease

## 2020-05-15 MED ORDER — ALBUTEROL SULFATE HFA 108 (90 BASE) MCG/ACT IN AERS: 2.0000 | INHALATION_SPRAY | Freq: Four times a day (QID) | RESPIRATORY_TRACT | 6 refills | Status: DC | PRN

## 2020-05-15 NOTE — Telephone Encounter (Signed)
Patient calling for refill of Ventolin HFA, refill sent to preferred pharmacy.

## 2020-05-16 ENCOUNTER — Other Ambulatory Visit: Payer: Self-pay | Admitting: *Deleted

## 2020-05-16 DIAGNOSIS — C50411 Malignant neoplasm of upper-outer quadrant of right female breast: Secondary | ICD-10-CM

## 2020-05-16 MED ORDER — ANASTROZOLE 1 MG PO TABS: 1.0000 mg | ORAL_TABLET | Freq: Every day | ORAL | 0 refills | Status: DC

## 2020-05-17 ENCOUNTER — Telehealth: Payer: Self-pay | Admitting: Hematology

## 2020-05-17 NOTE — Telephone Encounter (Signed)
Called pt per 8/10 sch msg - no answer . Left message for patient to call in to set up an appt.

## 2020-06-02 NOTE — Patient Instructions (Signed)
Continue Trelegy one puff daily

## 2020-06-19 ENCOUNTER — Telehealth: Payer: Self-pay | Admitting: Pulmonary Disease

## 2020-06-19 MED ORDER — PREDNISONE 10 MG PO TABS: 10.0000 mg | ORAL_TABLET | Freq: Every day | ORAL | 3 refills | Status: DC

## 2020-06-19 NOTE — Telephone Encounter (Signed)
Spoke with patient regarding refill request for Prednisone 10mg  sent into her pharmacy. Patient stated she is in the middle of moving and unsure what she done with her medication. Patient last taken medication's was on  thursdays . Patient appt was 05/02/20 televisit with beth.  Dr.Alva can you please advise.  Thank you

## 2020-06-19 NOTE — Telephone Encounter (Signed)
Okay to refill prednisone 10 mg daily x 30 tabs Please ensure she has follow-up appointment with APP within 1 next month

## 2020-06-19 NOTE — Telephone Encounter (Signed)
Patient contacted, refill for prednisone sent to preferred pharmacy, follow up appointment made for 06/30/2020 with NP Derl Barrow.

## 2020-06-30 ENCOUNTER — Ambulatory Visit (INDEPENDENT_AMBULATORY_CARE_PROVIDER_SITE_OTHER): Payer: Medicaid Other | Admitting: Primary Care

## 2020-06-30 ENCOUNTER — Encounter: Payer: Self-pay | Admitting: Primary Care

## 2020-06-30 ENCOUNTER — Other Ambulatory Visit: Payer: Self-pay

## 2020-06-30 VITALS — BP 120/70 | HR 84 | Temp 97.7°F | Ht 70.0 in | Wt 201.8 lb

## 2020-06-30 DIAGNOSIS — F410 Panic disorder [episodic paroxysmal anxiety] without agoraphobia: Secondary | ICD-10-CM

## 2020-06-30 DIAGNOSIS — F419 Anxiety disorder, unspecified: Secondary | ICD-10-CM | POA: Diagnosis not present

## 2020-06-30 DIAGNOSIS — F3170 Bipolar disorder, currently in remission, most recent episode unspecified: Secondary | ICD-10-CM | POA: Diagnosis not present

## 2020-06-30 DIAGNOSIS — J449 Chronic obstructive pulmonary disease, unspecified: Secondary | ICD-10-CM

## 2020-06-30 MED ORDER — TRELEGY ELLIPTA 200-62.5-25 MCG/INH IN AEPB: 1.0000 | INHALATION_SPRAY | Freq: Every day | RESPIRATORY_TRACT | 3 refills | Status: DC

## 2020-06-30 MED ORDER — FLUTICASONE PROPIONATE 50 MCG/ACT NA SUSP
1.0000 | Freq: Every day | NASAL | 5 refills | Status: DC
Start: 2020-06-30 — End: 2022-05-03

## 2020-06-30 MED ORDER — PREDNISONE 10 MG PO TABS: ORAL_TABLET | ORAL | 0 refills | Status: DC
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
## 2020-06-30 NOTE — Patient Instructions (Addendum)
Recommendations: - Start Trelegy 200 1 puff daily (rinse mouth after use) - Prednisone taper 40mg  x 2 days; 30mg  x 2 days; 20mg  x 2 days then continue prednisone 10mg  daily  - Flonase 1 spray per nostril daily  - Goal is to wean you off prednisone if able or arrive at the lowest effective dose. We will discuss this further at next visit after starting new dose Trelegy inhaler.   Referral: - Psychiatry re: anxiety/panic attacks  - Lung transplant- [facility] re: COPD, respiratory failure   Follow-up: - 6-8 weeks with Dr. Elsworth Soho (or his first available)    Chronic Obstructive Pulmonary Disease Chronic obstructive pulmonary disease (COPD) is a long-term (chronic) lung problem. When you have COPD, it is hard for air to get in and out of your lungs. Usually the condition gets worse over time, and your lungs will never return to normal. There are things you can do to keep yourself as healthy as possible.  Your doctor may treat your condition with: ? Medicines. ? Oxygen. ? Lung surgery.  Your doctor may also recommend: ? Rehabilitation. This includes steps to make your body work better. It may involve a team of specialists. ? Quitting smoking, if you smoke. ? Exercise and changes to your diet. ? Comfort measures (palliative care). Follow these instructions at home: Medicines  Take over-the-counter and prescription medicines only as told by your doctor.  Talk to your doctor before taking any cough or allergy medicines. You may need to avoid medicines that cause your lungs to be dry. Lifestyle  If you smoke, stop. Smoking makes the problem worse. If you need help quitting, ask your doctor.  Avoid being around things that make your breathing worse. This may include smoke, chemicals, and fumes.  Stay active, but remember to rest as well.  Learn and use tips on how to relax.  Make sure you get enough sleep. Most adults need at least 7 hours of sleep every night.  Eat healthy foods. Eat  smaller meals more often. Rest before meals. Controlled breathing Learn and use tips on how to control your breathing as told by your doctor. Try:  Breathing in (inhaling) through your nose for 1 second. Then, pucker your lips and breath out (exhale) through your lips for 2 seconds.  Putting one hand on your belly (abdomen). Breathe in slowly through your nose for 1 second. Your hand on your belly should move out. Pucker your lips and breathe out slowly through your lips. Your hand on your belly should move in as you breathe out.  Controlled coughing Learn and use controlled coughing to clear mucus from your lungs. Follow these steps: 1. Lean your head a little forward. 2. Breathe in deeply. 3. Try to hold your breath for 3 seconds. 4. Keep your mouth slightly open while coughing 2 times. 5. Spit any mucus out into a tissue. 6. Rest and do the steps again 1 or 2 times as needed. General instructions  Make sure you get all the shots (vaccines) that your doctor recommends. Ask your doctor about a flu shot and a pneumonia shot.  Use oxygen therapy and pulmonary rehabilitation if told by your doctor. If you need home oxygen therapy, ask your doctor if you should buy a tool to measure your oxygen level (oximeter).  Make a COPD action plan with your doctor. This helps you to know what to do if you feel worse than usual.  Manage any other conditions you have as told by your  doctor.  Avoid going outside when it is very hot, cold, or humid.  Avoid people who have a sickness you can catch (contagious).  Keep all follow-up visits as told by your doctor. This is important. Contact a doctor if:  You cough up more mucus than usual.  There is a change in the color or thickness of the mucus.  It is harder to breathe than usual.  Your breathing is faster than usual.  You have trouble sleeping.  You need to use your medicines more often than usual.  You have trouble doing your normal  activities such as getting dressed or walking around the house. Get help right away if:  You have shortness of breath while resting.  You have shortness of breath that stops you from: ? Being able to talk. ? Doing normal activities.  Your chest hurts for longer than 5 minutes.  Your skin color is more blue than usual.  Your pulse oximeter shows that you have low oxygen for longer than 5 minutes.  You have a fever.  You feel too tired to breathe normally. Summary  Chronic obstructive pulmonary disease (COPD) is a long-term lung problem.  The way your lungs work will never return to normal. Usually the condition gets worse over time. There are things you can do to keep yourself as healthy as possible.  Take over-the-counter and prescription medicines only as told by your doctor.  If you smoke, stop. Smoking makes the problem worse. This information is not intended to replace advice given to you by your health care provider. Make sure you discuss any questions you have with your health care provider. Document Revised: 09/05/2017 Document Reviewed: 10/28/2016 Elsevier Patient Education  2020 [place] for [employer] Calories are units of energy. Your body needs a certain amount of calories from food to keep you going throughout the day. When you eat more calories than your body needs, your body stores the extra calories as fat. When you eat fewer calories than your body needs, your body burns fat to get the energy it needs. Calorie counting means keeping track of how many calories you eat and drink each day. Calorie counting can be helpful if you need to lose weight. If you make sure to eat fewer calories than your body needs, you should lose weight. Ask your health care provider what a healthy weight is for you. For calorie counting to work, you will need to eat the right number of calories in a day in order to lose a healthy amount of weight per week. A  dietitian can help you determine how many calories you need in a day and will give you suggestions on how to reach your calorie goal.  A healthy amount of weight to lose per week is usually 1-2 lb (0.5-0.9 kg). This usually means that your daily calorie intake should be reduced by 500-750 calories.  Eating 1,200 - 1,500 calories per day can help most women lose weight.  Eating 1,500 - 1,800 calories per day can help most men lose weight. What is my plan? My goal is to have __________ calories per day. If I have this many calories per day, I should lose around __________ pounds per week. What do I need to know about calorie counting? In order to meet your daily calorie goal, you will need to:  Find out how many calories are in each food you would like to eat. Try to do this before you  eat.  Decide how much of the food you plan to eat.  Write down what you ate and how many calories it had. Doing this is called keeping a food log. To successfully lose weight, it is important to balance calorie counting with a healthy lifestyle that includes regular activity. Aim for 150 minutes of moderate exercise (such as walking) or 75 minutes of vigorous exercise (such as running) each week. Where do I find calorie information?  The number of calories in a food can be found on a Nutrition Facts label. If a food does not have a Nutrition Facts label, try to look up the calories online or ask your dietitian for help. Remember that calories are listed per serving. If you choose to have more than one serving of a food, you will have to multiply the calories per serving by the amount of servings you plan to eat. For example, the label on a package of bread might say that a serving size is 1 slice and that there are 90 calories in a serving. If you eat 1 slice, you will have eaten 90 calories. If you eat 2 slices, you will have eaten 180 calories. How do I keep a food log? Immediately after each meal, record the  following information in your food log:  What you ate. Don't forget to include toppings, sauces, and other extras on the food.  How much you ate. This can be measured in cups, ounces, or number of items.  How many calories each food and drink had.  The total number of calories in the meal. Keep your food log near you, such as in a small notebook in your pocket, or use a mobile app or website. Some programs will calculate calories for you and show you how many calories you have left for the day to meet your goal. What are some calorie counting tips?   Use your calories on foods and drinks that will fill you up and not leave you hungry: ? Some examples of foods that fill you up are nuts and nut butters, vegetables, lean proteins, and high-fiber foods like whole grains. High-fiber foods are foods with more than 5 g fiber per serving. ? Drinks such as sodas, specialty coffee drinks, alcohol, and juices have a lot of calories, yet do not fill you up.  Eat nutritious foods and avoid empty calories. Empty calories are calories you get from foods or beverages that do not have many vitamins or protein, such as candy, sweets, and soda. It is better to have a nutritious high-calorie food (such as an avocado) than a food with few nutrients (such as a bag of chips).  Know how many calories are in the foods you eat most often. This will help you calculate calorie counts faster.  Pay attention to calories in drinks. Low-calorie drinks include water and unsweetened drinks.  Pay attention to nutrition labels for "low fat" or "fat free" foods. These foods sometimes have the same amount of calories or more calories than the full fat versions. They also often have added sugar, starch, or salt, to make up for flavor that was removed with the fat.  Find a way of tracking calories that works for you. Get creative. Try different apps or programs if writing down calories does not work for you. What are some portion  control tips?  Know how many calories are in a serving. This will help you know how many servings of a certain food you can have.  Use a measuring cup to measure serving sizes. You could also try weighing out portions on a kitchen scale. With time, you will be able to estimate serving sizes for some foods.  Take some time to put servings of different foods on your favorite plates, bowls, and cups so you know what a serving looks like.  Try not to eat straight from a bag or box. Doing this can lead to overeating. Put the amount you would like to eat in a cup or on a plate to make sure you are eating the right portion.  Use smaller plates, glasses, and bowls to prevent overeating.  Try not to multitask (for example, watch TV or use your computer) while eating. If it is time to eat, sit down at a table and enjoy your food. This will help you to know when you are full. It will also help you to be aware of what you are eating and how much you are eating. What are tips for following this plan? Reading food labels  Check the calorie count compared to the serving size. The serving size may be smaller than what you are used to eating.  Check the source of the calories. Make sure the food you are eating is high in vitamins and protein and low in saturated and trans fats. Shopping  Read nutrition labels while you shop. This will help you make healthy decisions before you decide to purchase your food.  Make a grocery list and stick to it. Cooking  Try to cook your favorite foods in a healthier way. For example, try baking instead of frying.  Use low-fat dairy products. Meal planning  Use more fruits and vegetables. Half of your plate should be fruits and vegetables.  Include lean proteins like poultry and fish. How do I count calories when eating out?  Ask for smaller portion sizes.  Consider sharing an entree and sides instead of getting your own entree.  If you get your own entree, eat  only half. Ask for a box at the beginning of your meal and put the rest of your entree in it so you are not tempted to eat it.  If calories are listed on the menu, choose the lower calorie options.  Choose dishes that include vegetables, fruits, whole grains, low-fat dairy products, and lean protein.  Choose items that are boiled, broiled, grilled, or steamed. Stay away from items that are buttered, battered, fried, or served with cream sauce. Items labeled "crispy" are usually fried, unless stated otherwise.  Choose water, low-fat milk, unsweetened iced tea, or other drinks without added sugar. If you want an alcoholic beverage, choose a lower calorie option such as a glass of wine or light beer.  Ask for dressings, sauces, and syrups on the side. These are usually high in calories, so you should limit the amount you eat.  If you want a salad, choose a garden salad and ask for grilled meats. Avoid extra toppings like bacon, cheese, or fried items. Ask for the dressing on the side, or ask for olive oil and vinegar or lemon to use as dressing.  Estimate how many servings of a food you are given. For example, a serving of cooked rice is  cup or about the size of half a baseball. Knowing serving sizes will help you be aware of how much food you are eating at restaurants. The list below tells you how big or small some common portion sizes are based on everyday objects: ?  1 oz--4 stacked dice. ? 3 oz--1 deck of cards. ? 1 tsp--1 die. ? 1 Tbsp-- a ping-pong ball. ? 2 Tbsp--1 ping-pong ball. ?  cup-- baseball. ? 1 cup--1 baseball. Summary  Calorie counting means keeping track of how many calories you eat and drink each day. If you eat fewer calories than your body needs, you should lose weight.  A healthy amount of weight to lose per week is usually 1-2 lb (0.5-0.9 kg). This usually means reducing your daily calorie intake by 500-750 calories.  The number of calories in a food can be found  on a Nutrition Facts label. If a food does not have a Nutrition Facts label, try to look up the calories online or ask your dietitian for help.  Use your calories on foods and drinks that will fill you up, and not on foods and drinks that will leave you hungry.  Use smaller plates, glasses, and bowls to prevent overeating. This information is not intended to replace advice given to you by your health care provider. Make sure you discuss any questions you have with your health care provider. Document Revised: 06/12/2018 Document Reviewed: 08/23/2016 Elsevier Patient Education  [place].

## 2020-07-03 ENCOUNTER — Telehealth: Payer: Self-pay | Admitting: Primary Care

## 2020-07-03 NOTE — Telephone Encounter (Signed)
Spoke with patient regarding prior message . Patient stated that her pharmacy needs the provider's NPI(Beth) number for her to get her trelegy.[facility]ed [facility] and I keep discounted . Contacted and let patient aware.Will try again later

## 2020-07-04 ENCOUNTER — Telehealth: Payer: Self-pay | Admitting: Primary Care

## 2020-07-04 ENCOUNTER — Encounter: Payer: Self-pay | Admitting: Primary Care

## 2020-07-04 NOTE — Assessment & Plan Note (Addendum)
-   Patient reports increased symptoms of anxiety/panic attacks. NO thought of SI.  - She tried Xanax from a friend and reports that it helped  - Refer to psychiatry for medication management

## 2020-07-04 NOTE — Assessment & Plan Note (Signed)
-   Increased shortness of breath with associated wheezing/congested cough. Aucte flare vs worsening disease.  - Start Trelegy 200 1 puff daily (rinse mouth after use) - Prednisone taper 40mg  x 2 days; 30mg  x 2 days; 20mg  x 2 days; then continue prednisone 10mg  daily  - Flonase 1 spray per nostril daily  - Goal is to wean off prednisone if able or arrive at the lowest effective dose. We will discuss this further at next visit after starting new dose Trelegy inhaler.  - Referred back to [facility] for consideration for lung transplant (originally referred by Dr. Elsworth Soho) - FU 6-8 weeks with Dr. Elsworth Soho (or his first available)

## 2020-07-05 ENCOUNTER — Telehealth: Payer: Self-pay | Admitting: Primary Care

## 2020-07-05 NOTE — Telephone Encounter (Signed)
Called pt  She states trelegy needing PA  [facility] [place]  Was told she does need PA  Called [facility] [phone]  Pt ID number is [id number]   Called [facility] and they are closed- need to call back tomorrow  Note that the pt has tried and failed spiriva respimat, tudorza, incruse, bevespi and [place]

## 2020-07-05 NOTE — Telephone Encounter (Signed)
Spoke with [employer] She was questioning whether or not patient had active cancer. She has hx breast cancer dx in 2013 treated with lumpectomy, reoccurrence in 2016 treated with right mastectomy with breast reconstruction. She last saw oncology in October 2020, per Dr. Burr Medico note plan surveillance and cont oral adjunct therapy with anastrozole. She had a second reconstructive surgery with plastics recently. She is likely a poor candidate for lung transplant but [facility] is starting evaluation process. She has hx mental health illness, we have referred her to psychiatry. D/t history of past substance abuse she will likely need to submit to monthly UDS with them if proceed further.   Notes: PFTs- FVC 1.8 (51%), FEV1 0.7 (25%) Medications: Trelegy 200  Oxygen - 2L Prednisone- 10mg  daily, attempting to taper  Smoking status- Quit smoking Substance abuse- Denies current use, referred to psychiatry

## 2020-07-05 NOTE — Telephone Encounter (Signed)
Attempted to call pt to see if she was able to get inhaler from pharmacy but unable to reach. Left message for her to return call.

## 2020-07-06 NOTE — Telephone Encounter (Signed)
See phone note from 9/29. Will sign off.

## 2020-07-06 NOTE — Telephone Encounter (Signed)
Called and spoke to rep with [facility] and initiated a PA. Per pt's plan she must try and fail two preferred meds but Ruthe Mannan was the only preferred drug that pt has tried and failed. The rep states because there have been several she has tried and failed they will still send this for pharmacy review. Can call back in 24 hours for a determination, call [phone]. PA #: [id number].   Will call back for determination.

## 2020-07-10 NOTE — Progress Notes (Signed)
Patients case was discussed by lung transplant team with [facility], they did not feel that she was a good candidate d/t need of prescription pain and anxiety medication. She needs to be free from these substances for min 6 months. They will require  Monthly urine to test for 6 months.   In addition, all patients must be able to walk 1000 ft in 6 mins. Recommend formal pulmonary rehab program or community exercise program to work on fitness level. Once these have been completed she can be re-referred.

## 2020-07-11 NOTE — Telephone Encounter (Signed)
Called [facility] and spoke to rep. Trelegy was approved until 07/01/21. Called pharmacy and spoke to tech and was advised the Trelegy is now $3 for 30 day. Called and spoke to pt. Informed her of the approval. Pt verbalized understanding and denied any further questions or concerns at this time.

## 2020-07-21 ENCOUNTER — Other Ambulatory Visit: Payer: Self-pay | Admitting: Pulmonary Disease

## 2020-07-27 ENCOUNTER — Ambulatory Visit: Payer: Medicaid Other | Admitting: Pulmonary Disease

## 2020-08-15 ENCOUNTER — Telehealth: Payer: Self-pay | Admitting: Nurse Practitioner

## 2020-08-15 NOTE — Telephone Encounter (Signed)
Scheduled appt per 11/9 sch msg - pt is aware of new appt date and time

## 2020-08-24 ENCOUNTER — Inpatient Hospital Stay: Payer: Medicaid Other | Attending: Nurse Practitioner | Admitting: Nurse Practitioner

## 2020-09-23 ENCOUNTER — Telehealth: Payer: Self-pay | Admitting: Internal Medicine

## 2020-09-23 MED ORDER — ALBUTEROL SULFATE HFA 108 (90 BASE) MCG/ACT IN AERS: 2.0000 | INHALATION_SPRAY | RESPIRATORY_TRACT | 0 refills | Status: DC | PRN

## 2020-09-23 NOTE — Telephone Encounter (Signed)
Pt has been discharged from my practice for non adherence   Requesting refill on a Saturday morning for her albuterol or "won't make it thru the weekend"    Left message with the answering service  I would refill ventolin x one and take up issue of further refills with Dr Elsworth Soho next week

## 2020-10-20 ENCOUNTER — Telehealth: Payer: Self-pay | Admitting: Hematology

## 2020-10-20 ENCOUNTER — Other Ambulatory Visit: Payer: Self-pay | Admitting: Hematology

## 2020-10-20 DIAGNOSIS — C50411 Malignant neoplasm of upper-outer quadrant of right female breast: Secondary | ICD-10-CM

## 2020-10-20 NOTE — Telephone Encounter (Signed)
Returned call to schedule an appointment per 1/14 schedule message. Patient requested an appointment with Dr. Burr Medico for medication refill. Informed patient I would have to speak to the provider before scheduling an appointment.

## 2020-10-23 ENCOUNTER — Telehealth: Payer: Self-pay | Admitting: Hematology

## 2020-10-23 NOTE — Telephone Encounter (Signed)
Left message with follow-up appointment per 1/13 schedule message. Gave option to call back to reschedule if needed. 

## 2020-10-30 ENCOUNTER — Other Ambulatory Visit: Payer: Self-pay | Admitting: Pulmonary Disease

## 2020-11-01 ENCOUNTER — Other Ambulatory Visit: Payer: Self-pay | Admitting: *Deleted

## 2020-11-01 DIAGNOSIS — C50411 Malignant neoplasm of upper-outer quadrant of right female breast: Secondary | ICD-10-CM
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
## 2020-11-03 ENCOUNTER — Inpatient Hospital Stay: Payer: Medicaid Other | Attending: Nurse Practitioner | Admitting: Hematology

## 2020-11-03 ENCOUNTER — Inpatient Hospital Stay: Payer: Medicaid Other

## 2020-11-03 ENCOUNTER — Other Ambulatory Visit: Payer: Self-pay

## 2020-11-03 VITALS — BP 102/62 | HR 80 | Temp 98.7°F | Resp 22 | Ht 70.0 in | Wt 205.0 lb

## 2020-11-03 DIAGNOSIS — E2839 Other primary ovarian failure: Secondary | ICD-10-CM | POA: Diagnosis not present

## 2020-11-03 DIAGNOSIS — G4730 Sleep apnea, unspecified: Secondary | ICD-10-CM | POA: Insufficient documentation

## 2020-11-03 DIAGNOSIS — Z79811 Long term (current) use of aromatase inhibitors: Secondary | ICD-10-CM | POA: Diagnosis not present

## 2020-11-03 DIAGNOSIS — Z9981 Dependence on supplemental oxygen: Secondary | ICD-10-CM | POA: Diagnosis not present

## 2020-11-03 DIAGNOSIS — Z170 Estrogen receptor positive status [ER+]: Secondary | ICD-10-CM | POA: Insufficient documentation

## 2020-11-03 DIAGNOSIS — F319 Bipolar disorder, unspecified: Secondary | ICD-10-CM | POA: Insufficient documentation

## 2020-11-03 DIAGNOSIS — F209 Schizophrenia, unspecified: Secondary | ICD-10-CM | POA: Diagnosis not present

## 2020-11-03 DIAGNOSIS — I10 Essential (primary) hypertension: Secondary | ICD-10-CM | POA: Insufficient documentation

## 2020-11-03 DIAGNOSIS — Z7952 Long term (current) use of systemic steroids: Secondary | ICD-10-CM | POA: Insufficient documentation

## 2020-11-03 DIAGNOSIS — E039 Hypothyroidism, unspecified: Secondary | ICD-10-CM | POA: Diagnosis not present

## 2020-11-03 DIAGNOSIS — J449 Chronic obstructive pulmonary disease, unspecified: Secondary | ICD-10-CM | POA: Insufficient documentation

## 2020-11-03 DIAGNOSIS — F4310 Post-traumatic stress disorder, unspecified: Secondary | ICD-10-CM | POA: Diagnosis not present

## 2020-11-03 DIAGNOSIS — C50411 Malignant neoplasm of upper-outer quadrant of right female breast: Secondary | ICD-10-CM

## 2020-11-03 DIAGNOSIS — Z79899 Other long term (current) drug therapy: Secondary | ICD-10-CM | POA: Insufficient documentation

## 2020-11-03 LAB — CBC WITH DIFFERENTIAL (CANCER CENTER ONLY)
Abs Immature Granulocytes: 0.03 10*3/uL (ref 0.00–0.07)
Basophils Absolute: 0 10*3/uL (ref 0.0–0.1)
Basophils Relative: 0 %
Eosinophils Absolute: 0 10*3/uL (ref 0.0–0.5)
Eosinophils Relative: 0 %
HCT: 41.1 % (ref 36.0–46.0)
Hemoglobin: 12.3 g/dL (ref 12.0–15.0)
Immature Granulocytes: 0 %
Lymphocytes Relative: 10 %
Lymphs Abs: 1.1 10*3/uL (ref 0.7–4.0)
MCH: 25.5 pg — ABNORMAL LOW (ref 26.0–34.0)
MCHC: 29.9 g/dL — ABNORMAL LOW (ref 30.0–36.0)
MCV: 85.3 fL (ref 80.0–100.0)
Monocytes Absolute: 0.4 10*3/uL (ref 0.1–1.0)
Monocytes Relative: 4 %
Neutro Abs: 9 10*3/uL — ABNORMAL HIGH (ref 1.7–7.7)
Neutrophils Relative %: 86 %
Platelet Count: 326 10*3/uL (ref 150–400)
RBC: 4.82 MIL/uL (ref 3.87–5.11)
RDW: 15 % (ref 11.5–15.5)
WBC Count: 10.6 10*3/uL — ABNORMAL HIGH (ref 4.0–10.5)
nRBC: 0 % (ref 0.0–0.2)

## 2020-11-03 LAB — CMP (CANCER CENTER ONLY)
ALT: 20 U/L (ref 0–44)
AST: 20 U/L (ref 15–41)
Albumin: 3.6 g/dL (ref 3.5–5.0)
Alkaline Phosphatase: 90 U/L (ref 38–126)
Anion gap: 12 (ref 5–15)
BUN: 12 mg/dL (ref 6–20)
CO2: 31 mmol/L (ref 22–32)
Calcium: 9.5 mg/dL (ref 8.9–10.3)
Chloride: 97 mmol/L — ABNORMAL LOW (ref 98–111)
Creatinine: 0.68 mg/dL (ref 0.44–1.00)
GFR, Estimated: >60 mL/min (ref >60)
Glucose, Bld: 123 mg/dL — ABNORMAL HIGH (ref 70–99)
Potassium: 3.9 mmol/L (ref 3.5–5.1)
Sodium: 140 mmol/L (ref 135–145)
Total Bilirubin: 0.4 mg/dL (ref 0.3–1.2)
Total Protein: 7.6 g/dL (ref 6.5–8.1)

## 2020-11-03 MED ORDER — ANASTROZOLE 1 MG PO TABS: 1.0000 mg | ORAL_TABLET | Freq: Every day | ORAL | 3 refills | Status: DC

## 2020-11-04 ENCOUNTER — Encounter: Payer: Self-pay | Admitting: Hematology

## 2020-11-06 ENCOUNTER — Telehealth: Payer: Self-pay | Admitting: Hematology

## 2020-11-06 NOTE — Telephone Encounter (Signed)
Scheduled appointments per 1/28 los. Called patient, no answer. Left message with appointments date and times.

## 2020-11-09 ENCOUNTER — Other Ambulatory Visit: Payer: Self-pay | Admitting: Primary Care

## 2020-11-16 ENCOUNTER — Telehealth: Payer: Self-pay | Admitting: Pulmonary Disease

## 2020-11-16 NOTE — Telephone Encounter (Signed)
LMTCB  No nebulizer medication listed on med list. Will await call back from patient to f/u. Upon confirming medication with patient will route to provider [facility] for recommendations.

## 2020-11-21 MED ORDER — ALBUTEROL SULFATE (2.5 MG/3ML) 0.083% IN NEBU: 2.5000 mg | INHALATION_SOLUTION | Freq: Four times a day (QID) | RESPIRATORY_TRACT | 2 refills | Status: DC | PRN

## 2020-11-21 NOTE — Telephone Encounter (Signed)
Albuterol nebs every 6 hours as needed for wheezing #90 x 2 refills

## 2020-11-21 NOTE — Telephone Encounter (Signed)
lmtcb for pt.  

## 2020-11-21 NOTE — Telephone Encounter (Signed)
Called and spoke with pt who stated she was needing a refill of albuterol neb sol. Stated to pt that we did not have current Rx for any neb sol listed on medication list. Stated to pt that we would have to check with Dr. Elsworth Soho about med and once we heard from him we would call her back. Pt verbalized understanding.   Dr. Elsworth Soho, please advise on this.

## 2020-11-21 NOTE — Telephone Encounter (Signed)
Pt states they had stopped it and now want her to start back  [phone]

## 2020-11-21 NOTE — Telephone Encounter (Signed)
Rx has been sent to preferred pharmacy and pt aware. Nothing further needed.

## 2020-12-07 ENCOUNTER — Other Ambulatory Visit: Payer: Self-pay | Admitting: Pulmonary Disease

## 2020-12-29 ENCOUNTER — Telehealth: Payer: Self-pay | Admitting: Pulmonary Disease

## 2020-12-29 MED ORDER — ALBUTEROL SULFATE (2.5 MG/3ML) 0.083% IN NEBU: 2.5000 mg | INHALATION_SOLUTION | Freq: Four times a day (QID) | RESPIRATORY_TRACT | 2 refills | Status: DC | PRN

## 2020-12-29 MED ORDER — ALBUTEROL SULFATE HFA 108 (90 BASE) MCG/ACT IN AERS: INHALATION_SPRAY | RESPIRATORY_TRACT | 6 refills | Status: DC

## 2020-12-29 MED ORDER — TRELEGY ELLIPTA 200-62.5-25 MCG/INH IN AEPB: INHALATION_SPRAY | RESPIRATORY_TRACT | 3 refills | Status: DC

## 2020-12-29 NOTE — Telephone Encounter (Signed)
Called and spoke with patient. She stated that despite [place] sending in the refills last month, the pharmacy is still saying that she does not have any refills. Verified the pharmacy. Medications have been sent to the pharmacy.   Also managed to get her scheduled for a F/U on 01/23/21 at 245pm with RA.

## 2021-01-23 ENCOUNTER — Encounter: Payer: Self-pay | Admitting: Pulmonary Disease

## 2021-01-23 ENCOUNTER — Ambulatory Visit (INDEPENDENT_AMBULATORY_CARE_PROVIDER_SITE_OTHER): Payer: Medicaid Other | Admitting: Pulmonary Disease

## 2021-01-23 ENCOUNTER — Other Ambulatory Visit: Payer: Self-pay

## 2021-01-23 VITALS — BP 114/68 | HR 99 | Temp 97.5°F | Ht 69.0 in | Wt 206.4 lb

## 2021-01-23 DIAGNOSIS — J9611 Chronic respiratory failure with hypoxia: Secondary | ICD-10-CM

## 2021-01-23 DIAGNOSIS — J449 Chronic obstructive pulmonary disease, unspecified: Secondary | ICD-10-CM | POA: Diagnosis not present

## 2021-01-23 DIAGNOSIS — F1910 Other psychoactive substance abuse, uncomplicated: Secondary | ICD-10-CM | POA: Diagnosis not present

## 2021-01-23 MED ORDER — FUROSEMIDE 20 MG PO TABS: ORAL_TABLET | ORAL | 0 refills | Status: DC

## 2021-01-23 MED ORDER — PREDNISONE 5 MG PO TABS: 5.0000 mg | ORAL_TABLET | Freq: Every day | ORAL | 1 refills | Status: DC

## 2021-01-23 MED ORDER — TRELEGY ELLIPTA 200-62.5-25 MCG/INH IN AEPB: 1.0000 | INHALATION_SPRAY | Freq: Every day | RESPIRATORY_TRACT | 0 refills | Status: DC

## 2021-01-23 NOTE — Assessment & Plan Note (Addendum)
Continue Trelegy. She has done well and terms of decreased exacerbations. We will drop her prednisone to 5 mg and consider tapering this to off over the next few months if she does well She has pedal edema today, clinically she does not appear to be in cor pulmonale.  This may be related to steroid-induced fluid retention.  We will give her low-dose Lasix for 5 days and drop dose of steroids
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
## 2021-01-23 NOTE — Assessment & Plan Note (Signed)
Continue oxygen at all times. She is willing to enroll in pulmonary rehab and we will place referral

## 2021-01-23 NOTE — Patient Instructions (Addendum)
Lung rehab referral Continue on trelegy  [facility] transplant referral - please call [phone]  Lower prednisone to 5 mg daily Lasix 20 mg daily x 5 days

## 2021-01-23 NOTE — Assessment & Plan Note (Signed)
She has now been off tobacco and drugs by report for 2 years

## 2021-01-26 ENCOUNTER — Telehealth: Payer: Self-pay | Admitting: Pulmonary Disease

## 2021-01-26 NOTE — Telephone Encounter (Signed)
Would like to know if any other treatment options. Patient phone number is [phone].

## 2021-01-26 NOTE — Telephone Encounter (Signed)
Called and spoke to patient, who is requesting an alternative to pulmonary rehab. Patient's insurance does not cover pulmonary rehab.  Dr. Elsworth Soho, please advise. Thanks.

## 2021-01-29 NOTE — Telephone Encounter (Signed)
Called and spoke with patient regarding alternative to pulmonary rehab per Dr. Elsworth Soho. She states that Pulmonary Rehab does on except Medicaid, not that her insurance does not cover pulmonary rehab.  After telling her the alternative that Dr. Elsworth Soho recommended, she declined to do that.  Nothing further needed.

## 2021-01-29 NOTE — Telephone Encounter (Signed)
Alternative would be for her to exercise simply by walking 5 to 10 minutes a day and gradually increase by 5 minutes/week if she can tolerate

## 2021-02-16 ENCOUNTER — Telehealth: Payer: Self-pay | Admitting: Pulmonary Disease

## 2021-02-16 MED ORDER — ALBUTEROL SULFATE HFA 108 (90 BASE) MCG/ACT IN AERS: INHALATION_SPRAY | RESPIRATORY_TRACT | 6 refills | Status: DC

## 2021-02-16 NOTE — Telephone Encounter (Signed)
Called and spoke with Patient. Patient stated she is needing Ventolin and losartan refills.  Ventolin refill sent to requested [facility] pharmacy.  Patient stated she was told she has no further refills for Ventolin.  Advised Patient to call PCP about losartan refill, because it is not listed as being prescribed by Dr. Elsworth Soho.  Advised Patient to call for any issues.

## 2021-03-02 ENCOUNTER — Other Ambulatory Visit: Payer: Self-pay

## 2021-03-02 DIAGNOSIS — C50411 Malignant neoplasm of upper-outer quadrant of right female breast: Secondary | ICD-10-CM

## 2021-03-02 DIAGNOSIS — Z170 Estrogen receptor positive status [ER+]: Secondary | ICD-10-CM

## 2021-03-07 ENCOUNTER — Telehealth: Payer: Self-pay

## 2021-03-07 ENCOUNTER — Inpatient Hospital Stay: Payer: Medicaid Other

## 2021-03-07 ENCOUNTER — Inpatient Hospital Stay: Payer: Medicaid Other | Admitting: Hematology

## 2021-03-07 ENCOUNTER — Telehealth: Payer: Self-pay | Admitting: Hematology

## 2021-03-07 DIAGNOSIS — C50411 Malignant neoplasm of upper-outer quadrant of right female breast: Secondary | ICD-10-CM

## 2021-03-07 NOTE — Telephone Encounter (Signed)
R/s appts per 6/1 sch msg. Pt aware.

## 2021-03-07 NOTE — Telephone Encounter (Signed)
This nurse called patient related to missed appts from today.  Patient stated that she was having some swelling in her ankles and was unable to get to her appointments today.  Patient request to reschedule.  This nurse advised that scheduling would reach out to her to reschedule these appts and MD made aware.

## 2021-03-17 ENCOUNTER — Other Ambulatory Visit: Payer: Self-pay | Admitting: Pulmonary Disease

## 2021-03-23 ENCOUNTER — Telehealth: Payer: Self-pay | Admitting: Hematology

## 2021-03-23 NOTE — Telephone Encounter (Signed)
Rescheduled upcoming appointment due to provider's PAL. Patient is aware of changes. Mailed calendar. 

## 2021-03-26 ENCOUNTER — Encounter: Payer: Self-pay | Admitting: Hematology

## 2021-03-26 NOTE — Progress Notes (Signed)
Spoke w/ pt regarding the [employer].  Pt's insurance should pay for her treatment at 100% so copay assistance shouldn't be needed.  I informed her of the [employer], went over what it covers and gave her the income requirement.  Pt would like to apply so she will bring her proof of income on 03/29/21.  If approved I will give her an expense sheet and my card for any questions or concerns she may have in the future.

## 2021-03-28 ENCOUNTER — Other Ambulatory Visit: Payer: Medicaid Other

## 2021-03-29 ENCOUNTER — Telehealth: Payer: Self-pay | Admitting: Pulmonary Disease

## 2021-03-29 NOTE — Telephone Encounter (Signed)
Fax received from [employer] with following comments:  Our records indicate that the pt is currently in possession of rental equipment provided by an [employer] company. For continued coverage through the pt's insurance, the following is needed:  -face to face or office visit notes from (04/08/20) onwards that has (oxygen) discussion of continuous usage.  Since pt does have an upcoming office visit scheduled with Derl Barrow, NP 04/30/21, at that visit, we should do a requalifying walk so we can send this information to [employer].  I have updated pt's appt notes stating that a requalifying walk for O2 will need to be done. Nothing further needed.

## 2021-03-30 ENCOUNTER — Other Ambulatory Visit: Payer: Medicaid Other

## 2021-03-30 ENCOUNTER — Ambulatory Visit: Payer: Medicaid Other

## 2021-03-30 ENCOUNTER — Ambulatory Visit: Payer: Medicaid Other | Admitting: Hematology

## 2021-04-02 ENCOUNTER — Other Ambulatory Visit: Payer: Self-pay

## 2021-04-02 ENCOUNTER — Encounter: Payer: Self-pay | Admitting: Hematology

## 2021-04-02 ENCOUNTER — Ambulatory Visit
Admission: RE | Admit: 2021-04-02 | Discharge: 2021-04-02 | Disposition: A | Discharge status: Home / Self Care | Payer: Medicaid Other | Source: Ambulatory Visit | Attending: Hematology | Admitting: Hematology

## 2021-04-02 DIAGNOSIS — E2839 Other primary ovarian failure: Secondary | ICD-10-CM

## 2021-04-03 ENCOUNTER — Other Ambulatory Visit: Payer: Self-pay | Admitting: Hematology

## 2021-04-03 ENCOUNTER — Telehealth: Payer: Self-pay

## 2021-04-03 NOTE — Telephone Encounter (Signed)
-----   Message from Truitt Merle, MD sent at 04/03/2021  7:31 AM EDT ----- Please let pt know her DEXA scan result, and my recommendation of zometa (discussed on last visit), if she agrees and has no active dental issues, please schedule her first infusion on next visit and get it approved, orders are in. Thanks   Truitt Merle  04/03/2021

## 2021-04-03 NOTE — Telephone Encounter (Signed)
This nurse made patient aware of Dexa Scan results and recommendations per Dr. Burr Medico. Patient denies any active dental issues and is in agreement with starting Zometa infusions on her next appt 7/15.   No further questions or concerns at this time. Patient knows to call clinic with any problems, questons or concerns.

## 2021-04-04 ENCOUNTER — Telehealth: Payer: Self-pay | Admitting: Pulmonary Disease

## 2021-04-04 MED ORDER — ALBUTEROL SULFATE HFA 108 (90 BASE) MCG/ACT IN AERS: INHALATION_SPRAY | RESPIRATORY_TRACT | 6 refills | Status: DC

## 2021-04-04 MED ORDER — PREDNISONE 10 MG PO TABS: ORAL_TABLET | ORAL | 0 refills | Status: DC

## 2021-04-04 MED ORDER — TRELEGY ELLIPTA 200-62.5-25 MCG/INH IN AEPB: INHALATION_SPRAY | RESPIRATORY_TRACT | 3 refills | Status: DC

## 2021-04-04 MED ORDER — DOXYCYCLINE HYCLATE 100 MG PO TABS: 100.0000 mg | ORAL_TABLET | Freq: Two times a day (BID) | ORAL | 0 refills | Status: DC

## 2021-04-04 NOTE — Telephone Encounter (Signed)
Called and spoke to pt. Informed her of the recs per Dr. Elsworth Soho. Rx sent to preferred pharmacy. Pt verbalized understanding and denied any further questions or concerns at this time.

## 2021-04-04 NOTE — Telephone Encounter (Signed)
Called and spoke to pt. Pt states she has been out of trelegy x 2 days. This has been sent to pharmacy along with albuterol hfa. Pt c/o increase in SOB, prod cough with yellow mucus, wheezing x 2- 3 days. Pt vaccinated and boosted against covid. Pt has not had any covid exposure that she is aware of. Pt cp/tightness, no f/c/s. Pt last seen on 01/23/21 by Dr. Elsworth Soho for COPD GOLD IV. Pt taking meds as prescribed. Pt is requesting an abx.   Dr. Elsworth Soho, please advise. Thanks.
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
## 2021-04-20 ENCOUNTER — Encounter: Payer: Self-pay | Admitting: Physician Assistant

## 2021-04-20 ENCOUNTER — Inpatient Hospital Stay: Payer: Medicaid Other | Attending: Hematology

## 2021-04-20 ENCOUNTER — Inpatient Hospital Stay (HOSPITAL_BASED_OUTPATIENT_CLINIC_OR_DEPARTMENT_OTHER): Payer: Medicaid Other | Admitting: Physician Assistant

## 2021-04-20 ENCOUNTER — Other Ambulatory Visit: Payer: Self-pay

## 2021-04-20 ENCOUNTER — Inpatient Hospital Stay: Payer: Medicaid Other

## 2021-04-20 VITALS — BP 154/88 | HR 73 | Temp 98.2°F | Resp 19 | Ht 69.0 in | Wt 201.7 lb

## 2021-04-20 DIAGNOSIS — C50411 Malignant neoplasm of upper-outer quadrant of right female breast: Secondary | ICD-10-CM | POA: Diagnosis present

## 2021-04-20 DIAGNOSIS — Z7952 Long term (current) use of systemic steroids: Secondary | ICD-10-CM | POA: Diagnosis not present

## 2021-04-20 DIAGNOSIS — Z170 Estrogen receptor positive status [ER+]: Secondary | ICD-10-CM

## 2021-04-20 DIAGNOSIS — Z79811 Long term (current) use of aromatase inhibitors: Secondary | ICD-10-CM | POA: Insufficient documentation

## 2021-04-20 DIAGNOSIS — Z9981 Dependence on supplemental oxygen: Secondary | ICD-10-CM | POA: Insufficient documentation

## 2021-04-20 DIAGNOSIS — E039 Hypothyroidism, unspecified: Secondary | ICD-10-CM | POA: Insufficient documentation

## 2021-04-20 DIAGNOSIS — J449 Chronic obstructive pulmonary disease, unspecified: Secondary | ICD-10-CM | POA: Insufficient documentation

## 2021-04-20 DIAGNOSIS — Z79899 Other long term (current) drug therapy: Secondary | ICD-10-CM | POA: Diagnosis not present

## 2021-04-20 DIAGNOSIS — R110 Nausea: Secondary | ICD-10-CM | POA: Diagnosis not present

## 2021-04-20 DIAGNOSIS — I10 Essential (primary) hypertension: Secondary | ICD-10-CM | POA: Diagnosis not present

## 2021-04-20 LAB — CMP (CANCER CENTER ONLY)
ALT: 13 U/L (ref 0–44)
AST: 15 U/L (ref 15–41)
Albumin: 3.5 g/dL (ref 3.5–5.0)
Alkaline Phosphatase: 126 U/L (ref 38–126)
Anion gap: 8 (ref 5–15)
BUN: 10 mg/dL (ref 6–20)
CO2: 31 mmol/L (ref 22–32)
Calcium: 9.9 mg/dL (ref 8.9–10.3)
Chloride: 100 mmol/L (ref 98–111)
Creatinine: 0.65 mg/dL (ref 0.44–1.00)
GFR, Estimated: >60 mL/min (ref >60)
Glucose, Bld: 95 mg/dL (ref 70–99)
Potassium: 4.2 mmol/L (ref 3.5–5.1)
Sodium: 139 mmol/L (ref 135–145)
Total Bilirubin: 0.5 mg/dL (ref 0.3–1.2)
Total Protein: 7.6 g/dL (ref 6.5–8.1)

## 2021-04-20 LAB — CBC WITH DIFFERENTIAL (CANCER CENTER ONLY)
Abs Immature Granulocytes: 0.04 10*3/uL (ref 0.00–0.07)
Basophils Absolute: 0.1 10*3/uL (ref 0.0–0.1)
Basophils Relative: 0 %
Eosinophils Absolute: 0.1 10*3/uL (ref 0.0–0.5)
Eosinophils Relative: 1 %
HCT: 39.7 % (ref 36.0–46.0)
Hemoglobin: 12 g/dL (ref 12.0–15.0)
Immature Granulocytes: 0 %
Lymphocytes Relative: 9 %
Lymphs Abs: 1.3 10*3/uL (ref 0.7–4.0)
MCH: 25.6 pg — ABNORMAL LOW (ref 26.0–34.0)
MCHC: 30.2 g/dL (ref 30.0–36.0)
MCV: 84.6 fL (ref 80.0–100.0)
Monocytes Absolute: 0.9 10*3/uL (ref 0.1–1.0)
Monocytes Relative: 6 %
Neutro Abs: 11.7 10*3/uL — ABNORMAL HIGH (ref 1.7–7.7)
Neutrophils Relative %: 84 %
Platelet Count: 324 10*3/uL (ref 150–400)
RBC: 4.69 MIL/uL (ref 3.87–5.11)
RDW: 16.7 % — ABNORMAL HIGH (ref 11.5–15.5)
WBC Count: 14.1 10*3/uL — ABNORMAL HIGH (ref 4.0–10.5)
nRBC: 0 % (ref 0.0–0.2)

## 2021-04-20 MED ORDER — ONDANSETRON HCL 8 MG PO TABS: 8.0000 mg | ORAL_TABLET | Freq: Three times a day (TID) | ORAL | 0 refills | Status: DC | PRN

## 2021-04-20 MED ORDER — ZOLEDRONIC ACID 4 MG/100ML IV SOLN
4.0000 mg | Freq: Once | INTRAVENOUS | Status: AC
  Administered 2021-04-20: 4 mg via INTRAVENOUS

## 2021-04-20 MED ORDER — SODIUM CHLORIDE 0.9 % IV SOLN
Freq: Once | INTRAVENOUS | Status: AC
Start: 2021-04-20 — End: 2021-04-20
  Filled 2021-04-20: qty 250

## 2021-04-20 NOTE — Patient Instructions (Signed)

## 2021-04-21 ENCOUNTER — Other Ambulatory Visit: Payer: Self-pay | Admitting: Hematology

## 2021-04-21 DIAGNOSIS — C50411 Malignant neoplasm of upper-outer quadrant of right female breast: Secondary | ICD-10-CM

## 2021-04-24 ENCOUNTER — Telehealth: Payer: Self-pay | Admitting: Physician Assistant

## 2021-04-24 NOTE — Telephone Encounter (Signed)
Scheduled per los. Called and left msg. Mailed printout  °

## 2021-04-30 ENCOUNTER — Ambulatory Visit: Payer: Medicaid Other | Admitting: Primary Care

## 2021-05-02 ENCOUNTER — Telehealth: Payer: Self-pay | Admitting: Pulmonary Disease

## 2021-05-02 MED ORDER — ALBUTEROL SULFATE (2.5 MG/3ML) 0.083% IN NEBU: 2.5000 mg | INHALATION_SOLUTION | Freq: Four times a day (QID) | RESPIRATORY_TRACT | 6 refills | Status: DC | PRN

## 2021-05-02 MED ORDER — TRELEGY ELLIPTA 200-62.5-25 MCG/INH IN AEPB
1.0000 | INHALATION_SPRAY | Freq: Every day | RESPIRATORY_TRACT | 6 refills | Status: DC
Start: 2021-05-02 — End: 2021-07-09

## 2021-05-02 NOTE — Telephone Encounter (Signed)
Refill sent into the pharmacy. Left patient a message advising that refills have been sent into the pharmacy.

## 2021-05-04 ENCOUNTER — Other Ambulatory Visit: Payer: Medicaid Other

## 2021-05-09 ENCOUNTER — Other Ambulatory Visit: Payer: Self-pay | Admitting: Physician Assistant

## 2021-05-09 DIAGNOSIS — Z1231 Encounter for screening mammogram for malignant neoplasm of breast: Secondary | ICD-10-CM

## 2021-05-14 ENCOUNTER — Other Ambulatory Visit: Payer: Medicaid Other

## 2021-05-15 ENCOUNTER — Ambulatory Visit: Payer: Medicaid Other | Admitting: Primary Care
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
## 2021-06-21 ENCOUNTER — Other Ambulatory Visit: Payer: Self-pay | Admitting: Pulmonary Disease

## 2021-06-28 ENCOUNTER — Ambulatory Visit: Payer: Medicaid Other

## 2021-06-29 ENCOUNTER — Other Ambulatory Visit: Payer: Medicaid Other

## 2021-07-09 ENCOUNTER — Telehealth: Payer: Self-pay | Admitting: Pulmonary Disease

## 2021-07-09 MED ORDER — TRELEGY ELLIPTA 200-62.5-25 MCG/INH IN AEPB: 1.0000 | INHALATION_SPRAY | Freq: Every day | RESPIRATORY_TRACT | 6 refills | Status: DC

## 2021-07-09 NOTE — Telephone Encounter (Signed)
Called and spoke with Patient.  Patient stated she needed a new Trelegy refill sent to [place].  [facility]  Nothing further at this time.

## 2021-07-10 ENCOUNTER — Telehealth: Payer: Self-pay | Admitting: Pulmonary Disease

## 2021-07-10 MED ORDER — TRELEGY ELLIPTA 200-62.5-25 MCG/INH IN AEPB: 1.0000 | INHALATION_SPRAY | Freq: Every day | RESPIRATORY_TRACT | 0 refills | Status: DC

## 2021-07-10 NOTE — Telephone Encounter (Signed)
2 samples given to the pt of trelegy 200   [facility] and [facility] did state [facility] needs [facility]  No key given  Pt has medicaid  ID- [id number] Terald Sleeper [id number]  PCN [id number]  Called [place] at [phone]   Med approved from 07/11/21 until 07/02/21  [facility] and let [facility] know  Approval number is [id number] [phone]   Pt notified as well and nothing further needed

## 2021-07-19 ENCOUNTER — Ambulatory Visit: Payer: Medicaid Other | Admitting: Rheumatology

## 2021-07-20 ENCOUNTER — Other Ambulatory Visit: Payer: Self-pay | Admitting: Pulmonary Disease

## 2021-07-27 ENCOUNTER — Telehealth: Payer: Self-pay | Admitting: Pulmonary Disease

## 2021-07-27 NOTE — Telephone Encounter (Signed)
Pt states insurance will not pay for bipap- pt is not on bipap- pt is on non invasive ventilator. Pt states the insurance company needs "last OV notes" stating this is a non invasive vent, not a bipap. Please advise [phone] [facility] is [facility] Fax [phone] (cell) [phone]

## 2021-07-30 NOTE — Telephone Encounter (Signed)
Requested LOV notes faxed to [facility] as Patient requested.  Advised Patient to call with any issues or concerns.
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
## 2021-08-16 ENCOUNTER — Ambulatory Visit: Payer: Medicaid Other | Admitting: Rheumatology

## 2021-08-17 ENCOUNTER — Ambulatory Visit: Payer: Medicaid Other | Admitting: Rheumatology

## 2021-08-17 DIAGNOSIS — F1910 Other psychoactive substance abuse, uncomplicated: Secondary | ICD-10-CM

## 2021-08-17 DIAGNOSIS — F209 Schizophrenia, unspecified: Secondary | ICD-10-CM

## 2021-08-17 DIAGNOSIS — Z8639 Personal history of other endocrine, nutritional and metabolic disease: Secondary | ICD-10-CM

## 2021-08-17 DIAGNOSIS — Z720 Tobacco use: Secondary | ICD-10-CM

## 2021-08-17 DIAGNOSIS — Z8669 Personal history of other diseases of the nervous system and sense organs: Secondary | ICD-10-CM

## 2021-08-17 DIAGNOSIS — M2550 Pain in unspecified joint: Secondary | ICD-10-CM

## 2021-08-17 DIAGNOSIS — Z9011 Acquired absence of right breast and nipple: Secondary | ICD-10-CM

## 2021-08-17 DIAGNOSIS — F1410 Cocaine abuse, uncomplicated: Secondary | ICD-10-CM

## 2021-08-17 DIAGNOSIS — I10 Essential (primary) hypertension: Secondary | ICD-10-CM

## 2021-08-17 DIAGNOSIS — F3170 Bipolar disorder, currently in remission, most recent episode unspecified: Secondary | ICD-10-CM

## 2021-08-17 DIAGNOSIS — Z8679 Personal history of other diseases of the circulatory system: Secondary | ICD-10-CM

## 2021-08-17 DIAGNOSIS — G894 Chronic pain syndrome: Secondary | ICD-10-CM

## 2021-08-17 DIAGNOSIS — C50411 Malignant neoplasm of upper-outer quadrant of right female breast: Secondary | ICD-10-CM

## 2021-08-17 DIAGNOSIS — R768 Other specified abnormal immunological findings in serum: Secondary | ICD-10-CM

## 2021-08-17 DIAGNOSIS — M545 Low back pain, unspecified: Secondary | ICD-10-CM

## 2021-08-17 DIAGNOSIS — E559 Vitamin D deficiency, unspecified: Secondary | ICD-10-CM

## 2021-08-17 DIAGNOSIS — J449 Chronic obstructive pulmonary disease, unspecified: Secondary | ICD-10-CM

## 2021-09-04 ENCOUNTER — Telehealth: Payer: Self-pay | Admitting: Pulmonary Disease

## 2021-09-04 MED ORDER — TRELEGY ELLIPTA 200-62.5-25 MCG/ACT IN AEPB: 1.0000 | INHALATION_SPRAY | Freq: Every day | RESPIRATORY_TRACT | 0 refills | Status: DC

## 2021-09-04 NOTE — Telephone Encounter (Signed)
Called and spoke with patient. [facility] and she has been without the medication for 4 days. [facility] could not [facility] a [facility] of when the [facility] would arrive. She wanted to know if we had any samples. Advised her that we do have samples and I will place a sample up front for her.    Nothing further needed at time of call.

## 2021-09-07 ENCOUNTER — Encounter: Payer: Self-pay | Admitting: Gastroenterology
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
## 2021-10-11 ENCOUNTER — Telehealth: Payer: Self-pay | Admitting: Pulmonary Disease

## 2021-10-15 NOTE — Telephone Encounter (Signed)
Spoke with pt who requesting a letter written stating she is under Dr. Bari Mantis care and that is she physically well enough to tend to the physical needs of a child (60 years old). The letter will be given to Department of Social Services. Child is not pt's own but one that she will be taking care of. Dr. Elsworth Soho please advise.

## 2021-10-15 NOTE — Telephone Encounter (Signed)
Spoke with pt and reviewed Dr. Alva's recommendations. Pt stated understanding. Nothing further needed at this time.   

## 2021-10-17 ENCOUNTER — Telehealth: Payer: Self-pay | Admitting: *Deleted

## 2021-10-17 ENCOUNTER — Ambulatory Visit: Payer: Medicaid Other | Admitting: Rheumatology

## 2021-10-17 NOTE — Telephone Encounter (Signed)
Patient was referred for direct colonoscopy. During chart patient is on Oxygen and has difficult airway. Called patient and explained that she will need an OV before hospital colonoscopy can be done. Made office visit with patient for 2/14 at 230 pm. Patient unsure if medicaid will pay this-she is aware to bring $120 for OV self pay pt per [place], pt aware to bring a list of medications and insurance cards,photo ID. I did go over our 24 hour cancellation policy with patient. [facility] COLON and PV cancelled-pt aware.

## 2021-10-18 ENCOUNTER — Ambulatory Visit: Payer: Medicaid Other

## 2021-10-18 ENCOUNTER — Other Ambulatory Visit: Payer: Medicaid Other

## 2021-10-18 ENCOUNTER — Ambulatory Visit: Payer: Medicaid Other | Admitting: Hematology

## 2021-10-18 ENCOUNTER — Telehealth: Payer: Self-pay | Admitting: Hematology

## 2021-10-18 DIAGNOSIS — Z170 Estrogen receptor positive status [ER+]: Secondary | ICD-10-CM

## 2021-10-18 DIAGNOSIS — F209 Schizophrenia, unspecified: Secondary | ICD-10-CM

## 2021-10-18 NOTE — Telephone Encounter (Signed)
Sch per 1/12 inbasket,pt aware °

## 2021-10-21 ENCOUNTER — Other Ambulatory Visit: Payer: Self-pay | Admitting: Pulmonary Disease

## 2021-10-22 ENCOUNTER — Inpatient Hospital Stay: Payer: Medicaid Other

## 2021-10-22 ENCOUNTER — Other Ambulatory Visit: Payer: Self-pay

## 2021-10-22 ENCOUNTER — Encounter: Payer: Self-pay | Admitting: Hematology

## 2021-10-22 ENCOUNTER — Inpatient Hospital Stay: Payer: Medicaid Other | Attending: Hematology

## 2021-10-22 ENCOUNTER — Inpatient Hospital Stay (HOSPITAL_BASED_OUTPATIENT_CLINIC_OR_DEPARTMENT_OTHER): Payer: Medicaid Other | Admitting: Hematology

## 2021-10-22 VITALS — BP 122/91 | HR 89 | Temp 98.4°F | Resp 22 | Ht 69.0 in | Wt 210.2 lb

## 2021-10-22 DIAGNOSIS — Z170 Estrogen receptor positive status [ER+]: Secondary | ICD-10-CM | POA: Insufficient documentation

## 2021-10-22 DIAGNOSIS — C50411 Malignant neoplasm of upper-outer quadrant of right female breast: Secondary | ICD-10-CM

## 2021-10-22 DIAGNOSIS — Z79811 Long term (current) use of aromatase inhibitors: Secondary | ICD-10-CM | POA: Insufficient documentation

## 2021-10-22 DIAGNOSIS — I10 Essential (primary) hypertension: Secondary | ICD-10-CM | POA: Diagnosis not present

## 2021-10-22 DIAGNOSIS — M810 Age-related osteoporosis without current pathological fracture: Secondary | ICD-10-CM | POA: Diagnosis present

## 2021-10-22 DIAGNOSIS — Z7952 Long term (current) use of systemic steroids: Secondary | ICD-10-CM | POA: Insufficient documentation

## 2021-10-22 DIAGNOSIS — Z9981 Dependence on supplemental oxygen: Secondary | ICD-10-CM | POA: Diagnosis not present

## 2021-10-22 DIAGNOSIS — Z79899 Other long term (current) drug therapy: Secondary | ICD-10-CM | POA: Insufficient documentation

## 2021-10-22 DIAGNOSIS — Z7964 Long term (current) use of myelosuppressive agent: Secondary | ICD-10-CM | POA: Diagnosis not present

## 2021-10-22 LAB — CMP (CANCER CENTER ONLY)
ALT: 12 U/L (ref 0–44)
AST: 16 U/L (ref 15–41)
Albumin: 4 g/dL (ref 3.5–5.0)
Alkaline Phosphatase: 95 U/L (ref 38–126)
Anion gap: 8 (ref 5–15)
BUN: 10 mg/dL (ref 6–20)
CO2: 30 mmol/L (ref 22–32)
Calcium: 9.5 mg/dL (ref 8.9–10.3)
Chloride: 102 mmol/L (ref 98–111)
Creatinine: 0.52 mg/dL (ref 0.44–1.00)
GFR, Estimated: >60 mL/min (ref >60)
Glucose, Bld: 91 mg/dL (ref 70–99)
Potassium: 4 mmol/L (ref 3.5–5.1)
Sodium: 140 mmol/L (ref 135–145)
Total Bilirubin: 0.5 mg/dL (ref 0.3–1.2)
Total Protein: 7.6 g/dL (ref 6.5–8.1)

## 2021-10-22 LAB — CBC WITH DIFFERENTIAL (CANCER CENTER ONLY)
Abs Immature Granulocytes: 0.03 10*3/uL (ref 0.00–0.07)
Basophils Absolute: 0 10*3/uL (ref 0.0–0.1)
Basophils Relative: 0 %
Eosinophils Absolute: 0.1 10*3/uL (ref 0.0–0.5)
Eosinophils Relative: 1 %
HCT: 40.1 % (ref 36.0–46.0)
Hemoglobin: 11.9 g/dL — ABNORMAL LOW (ref 12.0–15.0)
Immature Granulocytes: 0 %
Lymphocytes Relative: 21 %
Lymphs Abs: 2.2 10*3/uL (ref 0.7–4.0)
MCH: 25.2 pg — ABNORMAL LOW (ref 26.0–34.0)
MCHC: 29.7 g/dL — ABNORMAL LOW (ref 30.0–36.0)
MCV: 84.8 fL (ref 80.0–100.0)
Monocytes Absolute: 0.8 10*3/uL (ref 0.1–1.0)
Monocytes Relative: 8 %
Neutro Abs: 7.2 10*3/uL (ref 1.7–7.7)
Neutrophils Relative %: 70 %
Platelet Count: 327 10*3/uL (ref 150–400)
RBC: 4.73 MIL/uL (ref 3.87–5.11)
RDW: 15.5 % (ref 11.5–15.5)
WBC Count: 10.4 10*3/uL (ref 4.0–10.5)
nRBC: 0 % (ref 0.0–0.2)

## 2021-10-22 MED ORDER — TAMOXIFEN CITRATE 20 MG PO TABS: 20.0000 mg | ORAL_TABLET | Freq: Every day | ORAL | 5 refills | Status: DC

## 2021-10-22 MED ORDER — SODIUM CHLORIDE 0.9 % IV SOLN: Freq: Once | INTRAVENOUS | Status: AC

## 2021-10-22 MED ORDER — ZOLEDRONIC ACID 4 MG/100ML IV SOLN
4.0000 mg | Freq: Once | INTRAVENOUS | Status: AC
  Administered 2021-10-22: 4 mg via INTRAVENOUS
  Filled 2021-10-22: qty 100
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
## 2021-10-22 NOTE — Patient Instructions (Signed)

## 2021-10-23 ENCOUNTER — Other Ambulatory Visit: Payer: Self-pay

## 2021-10-24 ENCOUNTER — Telehealth: Payer: Self-pay | Admitting: Hematology

## 2021-10-24 NOTE — Telephone Encounter (Signed)
Scheduled follow-up appointment per 1/16 los. Patient is aware. Mailed calendar.

## 2021-10-26 ENCOUNTER — Telehealth: Payer: Self-pay

## 2021-10-26 NOTE — Telephone Encounter (Signed)
This nurse reached out to this patient to inform her about scheduled Bone Scan appointment.  Left a message for patient to return call to the clinic.  No further concerns at this time.

## 2021-11-01 ENCOUNTER — Encounter (HOSPITAL_COMMUNITY): Payer: Medicaid Other

## 2021-11-07 ENCOUNTER — Encounter (HOSPITAL_COMMUNITY): Payer: Medicaid Other | Attending: Hematology

## 2021-11-07 ENCOUNTER — Encounter (HOSPITAL_COMMUNITY): Admission: RE | Admit: 2021-11-07 | Payer: Medicaid Other | Source: Ambulatory Visit

## 2021-11-09 ENCOUNTER — Encounter: Payer: Medicaid Other | Admitting: Gastroenterology

## 2021-11-14 ENCOUNTER — Other Ambulatory Visit: Payer: Self-pay | Admitting: Pulmonary Disease

## 2021-11-14 ENCOUNTER — Telehealth: Payer: Self-pay | Admitting: Pulmonary Disease

## 2021-11-14 MED ORDER — PREDNISONE 5 MG PO TABS: ORAL_TABLET | ORAL | 0 refills | Status: DC

## 2021-11-14 NOTE — Telephone Encounter (Signed)
I called the patient and made her a follow up and sent in enough prednisone until her follow up appointment with Dr. Elsworth Soho next month. She is aware to keep the follow up to get further refills. Nothing fur there needed.

## 2021-11-20 ENCOUNTER — Ambulatory Visit: Payer: Medicaid Other | Admitting: Gastroenterology

## 2021-11-28 ENCOUNTER — Telehealth: Payer: Self-pay

## 2021-11-28 ENCOUNTER — Other Ambulatory Visit: Payer: Self-pay

## 2021-11-28 NOTE — Telephone Encounter (Signed)
Pt called requesting refill on her "Thyroid Medicine".  Pt has Levothyroxine prescribed/refilled  by Dr. Lavon Paganini Angiulli on 12/24/2018.  Pt is getting Zometa.  Pt stated she would like her refill to be sent to [facility] on [address].  Informed pt that this RN will make Dr. Burr Medico aware of her request for refill.

## 2021-11-29 ENCOUNTER — Telehealth: Payer: Self-pay

## 2021-11-29 NOTE — Telephone Encounter (Signed)
Spoke with pt via telephone regarding her Levothyroxine medication.  Informed pt that Dr. Burr Medico stated she will need to contact her PCP or Endocrinologist to have the prescription refilled.  Informed pt that this medication requires monthly blood work  to monitor is the medication working or dose needs to be adjusted.  Pt verbalized understanding and stated she will contact her PCP to request a refill.  Pt had no further questions or concerns at this time.

## 2021-12-31 ENCOUNTER — Ambulatory Visit: Payer: Medicaid Other | Admitting: Pulmonary Disease

## 2022-01-01 ENCOUNTER — Other Ambulatory Visit: Payer: Self-pay

## 2022-01-01 ENCOUNTER — Ambulatory Visit: Payer: Medicaid Other | Admitting: Gastroenterology

## 2022-01-01 MED ORDER — TAMOXIFEN CITRATE 20 MG PO TABS: 20.0000 mg | ORAL_TABLET | Freq: Every day | ORAL | 5 refills | Status: DC

## 2022-01-14 ENCOUNTER — Other Ambulatory Visit: Payer: Self-pay | Admitting: Pulmonary Disease

## 2022-01-14 MED ORDER — TRELEGY ELLIPTA 200-62.5-25 MCG/ACT IN AEPB: 1.0000 | INHALATION_SPRAY | Freq: Every day | RESPIRATORY_TRACT | 0 refills | Status: DC

## 2022-01-29 ENCOUNTER — Telehealth: Payer: Self-pay | Admitting: Pulmonary Disease

## 2022-01-30 MED ORDER — TRELEGY ELLIPTA 200-62.5-25 MCG/ACT IN AEPB: 1.0000 | INHALATION_SPRAY | Freq: Every day | RESPIRATORY_TRACT | 5 refills | Status: DC

## 2022-01-30 NOTE — Telephone Encounter (Signed)
Rx for pt's Trelegy was sent to preferred pharmacy for pt. Called and spoke with pt letting her know this had been done and she verbalized  understanding. Nothing further needed. ?

## 2022-02-13 ENCOUNTER — Ambulatory Visit (INDEPENDENT_AMBULATORY_CARE_PROVIDER_SITE_OTHER): Payer: Medicaid Other | Admitting: Pulmonary Disease

## 2022-02-13 ENCOUNTER — Encounter: Payer: Self-pay | Admitting: Pulmonary Disease

## 2022-02-13 DIAGNOSIS — J449 Chronic obstructive pulmonary disease, unspecified: Secondary | ICD-10-CM

## 2022-02-13 DIAGNOSIS — J9611 Chronic respiratory failure with hypoxia: Secondary | ICD-10-CM | POA: Diagnosis not present

## 2022-02-13 MED ORDER — FLUTICASONE-UMECLIDIN-VILANT 200-62.5-25 MCG/ACT IN AEPB: 1.0000 | INHALATION_SPRAY | Freq: Every day | RESPIRATORY_TRACT | 0 refills | Status: DC

## 2022-02-13 NOTE — Assessment & Plan Note (Addendum)
I discussed with her that Trelegy is a maintenance medication and is really done well for her, decreased hospitalizations.  I would like her to continue on this.  I explained that tremors and palpitations are more likely to be related to albuterol and increased S ABA usage.  I educated her on using this every 4-6 hours only as needed. ?She has done well with quitting tobacco and drugs and is trying to get her life back together I think she would benefit from a pulmonary rehab program but unfortunately co-pay is too high ? ?I have asked her to taper prednisone to 5 mg every other day and will attempt to discontinue this next month ?

## 2022-02-13 NOTE — Assessment & Plan Note (Signed)
Compliant with oxygen. ?I encouraged aerobic activity. ?We will reconsider transplant referral based on her course ?

## 2022-02-13 NOTE — Patient Instructions (Signed)
?  X Decrease prednisone '5mg'$  M/W/F for May then STOP ? ?Continue trelegy ?Use albuterol as needed only ?

## 2022-02-13 NOTE — Progress Notes (Signed)
? ?  Subjective:  ? ? Patient ID: Courtney Terry, female    DOB: 17-Sep-1961, 61 y.o.   MRN: [id number] ? ?HPI ? ?61 Yo smoker for follow-up of severe COPD and chronic respiratory failure on home oxygen  ?  ?PMH - substance abuse/cocaine and severe protein calorie malnutrition, schizophrenia   ?Breast cancer 2013 ?Severe anxiety ? ?She had prolonged hospitalization in 11/2018, COPD exacerbation, cocaine positive, required tracheostomy and was eventually decannulated . ?-  admit 08/2019 for acute hypercarbic respiratory failure, improved with BiPAP-did not qualify for trilogy device.  Discharged with home BiPAP which she did not use ? ?Chief Complaint  ?Patient presents with  ? Follow-up  ?  States she has high blood pressure and shouldn't be on trelegy  ? ?Last office visit 01/2021, we referred her to pulmonary rehab -co-pay was too high and she did not join ? she also was referred to [facility] transplant program -due to concern about her breast cancer she was felt not to be a candidate as per last phone note ?she had severe anxiety around her brother who is living with her and was smoking and using drugs .  She is now married lives with her husband Joneen Boers who is taking care of her. ? ?I have not seen her for a year Allied seems her primary care continued her prednisone and she is taking 5 mg of prednisone daily ?She gained weight from 192 pounds in 2021 to 206 pounds in 2022 and has maintained this weight.  She arrives in a wheelchair today because she does not want to walk.  She is compliant with her oxygen. ?She is concerned that Trelegy may be affecting her blood pressure.  She also reports increased anxiety and tremors after using albuterol ? ?Significant tests/ events reviewed ? ?Alpha-1- MM ?  ?Spirometry 10/2016  >>FVC 1.79 (51%] , FEV1 0.69 (25%)  , ratio  39 ?  ?Echo 11/2017 RVSP 48 ?  ?CT chest 11/2017  Scattered predominantly subpleural nodules in the lungs ?  ?CT angiogram 11/15/2019 centrilobular emphysema, nodular  opacity smaller than previous study, T7 wedging ? ? ? ? ?Review of Systems ?neg for any significant sore throat, dysphagia, itching, sneezing, nasal congestion or excess/ purulent secretions, fever, chills, sweats, unintended wt loss, pleuritic or exertional cp, hempoptysis, orthopnea pnd or change in chronic leg swelling. Also denies presyncope, palpitations, heartburn, abdominal pain, nausea, vomiting, diarrhea or change in bowel or urinary habits, dysuria,hematuria, rash, arthralgias, visual complaints, headache, numbness weakness or ataxia. ? ?   ?Objective:  ? Physical Exam ? ?Gen. Pleasant, obese, in no distress ?ENT - no lesions, no post nasal drip ?Neck: No JVD, no thyromegaly, no carotid bruits ?Lungs: no use of accessory muscles, no dullness to percussion, decreased without rales or rhonchi  ?Cardiovascular: Rhythm regular, heart sounds  normal, no murmurs or gallops, no peripheral edema ?Musculoskeletal: No deformities, no cyanosis or clubbing , no tremors ? ? ? ? ?   ?Assessment & Plan:  ? ? ?

## 2022-02-21 ENCOUNTER — Other Ambulatory Visit: Payer: Self-pay | Admitting: Pulmonary Disease

## 2022-03-07 ENCOUNTER — Telehealth: Payer: Self-pay | Admitting: Pulmonary Disease

## 2022-03-07 NOTE — Telephone Encounter (Signed)
Left message for patient to call back  

## 2022-03-13 MED ORDER — AZITHROMYCIN 250 MG PO TABS: ORAL_TABLET | ORAL | 0 refills | Status: DC

## 2022-03-13 MED ORDER — PREDNISONE 10 MG PO TABS: ORAL_TABLET | ORAL | 0 refills | Status: DC

## 2022-03-13 NOTE — Telephone Encounter (Signed)
Rigoberto Noel, MD to Monna Fam L, RN      2:24 PM  Zpak  Prednisone 10 mg tabs  Take 2 tabs daily with food x 5ds, then 1 tab daily with food x 5ds then STOP

## 2022-03-13 NOTE — Telephone Encounter (Signed)
Called patient and she states that for the past 3 weeks she has been having runny nose and a deep productive cough. She states that her mucus is yellow to green. The cough is so bad that it is making her chest hurt. She states covid and flu were negative. She has been trying some OTC medications such as Nyquil, Theraflu and Alkaselzer plus but no help.  Please advise Dr Elsworth Soho

## 2022-03-13 NOTE — Telephone Encounter (Signed)
Pt notified of response per Dr Elsworth Soho and the meds sent to preferred pharm

## 2022-03-27 ENCOUNTER — Telehealth: Payer: Self-pay | Admitting: Pulmonary Disease

## 2022-03-27 NOTE — Telephone Encounter (Signed)
Spoke with pt who states cough was not helped by ABX or pred taper recently given to pt. Pt also stated running temp of 99.1. Pt believes her cough may be in some way r/t her C Pap mask. Pt instructed to take home Covid test and let us know results in the morning then if negative pt needs to be seen in OV tomorrow. Pt stated understanding. Pt instructed if SOB occurs or cough gets worse to seek evaluation at [facility] or ER.

## 2022-04-12 ENCOUNTER — Other Ambulatory Visit (HOSPITAL_COMMUNITY): Payer: Self-pay

## 2022-04-19 ENCOUNTER — Other Ambulatory Visit: Payer: Self-pay

## 2022-04-19 DIAGNOSIS — C50411 Malignant neoplasm of upper-outer quadrant of right female breast: Secondary | ICD-10-CM

## 2022-04-19 DIAGNOSIS — Z170 Estrogen receptor positive status [ER+]: Secondary | ICD-10-CM

## 2022-04-22 ENCOUNTER — Inpatient Hospital Stay: Payer: Medicaid Other | Attending: Hematology

## 2022-04-22 ENCOUNTER — Inpatient Hospital Stay (HOSPITAL_BASED_OUTPATIENT_CLINIC_OR_DEPARTMENT_OTHER): Payer: Medicaid Other | Admitting: Hematology

## 2022-04-22 ENCOUNTER — Inpatient Hospital Stay: Payer: Medicaid Other

## 2022-04-22 ENCOUNTER — Other Ambulatory Visit: Payer: Self-pay | Admitting: Hematology

## 2022-04-22 ENCOUNTER — Telehealth: Payer: Self-pay

## 2022-04-22 ENCOUNTER — Other Ambulatory Visit: Payer: Self-pay

## 2022-04-22 VITALS — BP 115/77 | HR 98 | Temp 98.4°F | Resp 15 | Ht 69.0 in | Wt 208.0 lb

## 2022-04-22 DIAGNOSIS — Z79899 Other long term (current) drug therapy: Secondary | ICD-10-CM | POA: Insufficient documentation

## 2022-04-22 DIAGNOSIS — Z170 Estrogen receptor positive status [ER+]: Secondary | ICD-10-CM | POA: Diagnosis not present

## 2022-04-22 DIAGNOSIS — C50411 Malignant neoplasm of upper-outer quadrant of right female breast: Secondary | ICD-10-CM

## 2022-04-22 DIAGNOSIS — Z7952 Long term (current) use of systemic steroids: Secondary | ICD-10-CM | POA: Diagnosis not present

## 2022-04-22 DIAGNOSIS — Z1231 Encounter for screening mammogram for malignant neoplasm of breast: Secondary | ICD-10-CM

## 2022-04-22 DIAGNOSIS — M810 Age-related osteoporosis without current pathological fracture: Secondary | ICD-10-CM | POA: Insufficient documentation

## 2022-04-22 DIAGNOSIS — J449 Chronic obstructive pulmonary disease, unspecified: Secondary | ICD-10-CM | POA: Insufficient documentation

## 2022-04-22 DIAGNOSIS — Z79810 Long term (current) use of selective estrogen receptor modulators (SERMs): Secondary | ICD-10-CM | POA: Insufficient documentation

## 2022-04-22 DIAGNOSIS — F209 Schizophrenia, unspecified: Secondary | ICD-10-CM | POA: Diagnosis not present

## 2022-04-22 DIAGNOSIS — I10 Essential (primary) hypertension: Secondary | ICD-10-CM | POA: Insufficient documentation

## 2022-04-22 DIAGNOSIS — F319 Bipolar disorder, unspecified: Secondary | ICD-10-CM | POA: Insufficient documentation

## 2022-04-22 DIAGNOSIS — Z9981 Dependence on supplemental oxygen: Secondary | ICD-10-CM | POA: Insufficient documentation

## 2022-04-22 DIAGNOSIS — E039 Hypothyroidism, unspecified: Secondary | ICD-10-CM | POA: Insufficient documentation

## 2022-04-22 LAB — CMP (CANCER CENTER ONLY)
ALT: 17 U/L (ref 0–44)
AST: 21 U/L (ref 15–41)
Albumin: 4.4 g/dL (ref 3.5–5.0)
Alkaline Phosphatase: 85 U/L (ref 38–126)
Anion gap: 7 (ref 5–15)
BUN: 11 mg/dL (ref 6–20)
CO2: 27 mmol/L (ref 22–32)
Calcium: 9.4 mg/dL (ref 8.9–10.3)
Chloride: 106 mmol/L (ref 98–111)
Creatinine: 0.58 mg/dL (ref 0.44–1.00)
GFR, Estimated: >60 mL/min (ref >60)
Glucose, Bld: 110 mg/dL — ABNORMAL HIGH (ref 70–99)
Potassium: 4 mmol/L (ref 3.5–5.1)
Sodium: 140 mmol/L (ref 135–145)
Total Bilirubin: 0.7 mg/dL (ref 0.3–1.2)
Total Protein: 7.9 g/dL (ref 6.5–8.1)

## 2022-04-22 LAB — CBC WITH DIFFERENTIAL (CANCER CENTER ONLY)
Abs Immature Granulocytes: 0.05 10*3/uL (ref 0.00–0.07)
Basophils Absolute: 0 10*3/uL (ref 0.0–0.1)
Basophils Relative: 0 %
Eosinophils Absolute: 0.1 10*3/uL (ref 0.0–0.5)
Eosinophils Relative: 1 %
HCT: 41.3 % (ref 36.0–46.0)
Hemoglobin: 13.1 g/dL (ref 12.0–15.0)
Immature Granulocytes: 0 %
Lymphocytes Relative: 23 %
Lymphs Abs: 3 10*3/uL (ref 0.7–4.0)
MCH: 26.5 pg (ref 26.0–34.0)
MCHC: 31.7 g/dL (ref 30.0–36.0)
MCV: 83.6 fL (ref 80.0–100.0)
Monocytes Absolute: 1 10*3/uL (ref 0.1–1.0)
Monocytes Relative: 7 %
Neutro Abs: 9.3 10*3/uL — ABNORMAL HIGH (ref 1.7–7.7)
Neutrophils Relative %: 69 %
Platelet Count: 273 10*3/uL (ref 150–400)
RBC: 4.94 MIL/uL (ref 3.87–5.11)
RDW: 16.6 % — ABNORMAL HIGH (ref 11.5–15.5)
WBC Count: 13.5 10*3/uL — ABNORMAL HIGH (ref 4.0–10.5)
nRBC: 0 % (ref 0.0–0.2)

## 2022-04-22 MED ORDER — TAMOXIFEN CITRATE 20 MG PO TABS: 20.0000 mg | ORAL_TABLET | Freq: Every day | ORAL | 1 refills | Status: DC

## 2022-04-22 MED ORDER — SODIUM CHLORIDE 0.9 % IV SOLN: Freq: Once | INTRAVENOUS | Status: DC

## 2022-04-22 MED ORDER — ZOLEDRONIC ACID 4 MG/100ML IV SOLN
4.0000 mg | Freq: Once | INTRAVENOUS | Status: DC
  Filled 2022-04-22: qty 100
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
## 2022-04-22 NOTE — Patient Instructions (Signed)

## 2022-04-22 NOTE — Progress Notes (Signed)
Patient refused treatment with zometa today. Patient stated that she did not have time to wait for the IV team to come. MD notified. Patient is going to reschedule zometa infusion.

## 2022-04-22 NOTE — Telephone Encounter (Signed)
Spoke with the [facility] to get pt scheduled for her mammogram.  Pt is scheduled on 05/01/2022 at 4:20pm with an arrival time of 4:00pm.  Spoke with pt via telephone to inform pt of her Mammogram appointment at the [facility].  Gave pt the address to the [facility].  Pt verbalized understanding and confirmed appointment.  Pt had no further questions or concerns at this time.

## 2022-04-26 ENCOUNTER — Inpatient Hospital Stay: Payer: Medicaid Other

## 2022-04-26 ENCOUNTER — Other Ambulatory Visit: Payer: Self-pay

## 2022-04-26 VITALS — BP 123/69 | HR 88 | Temp 98.3°F | Resp 20

## 2022-04-26 DIAGNOSIS — M810 Age-related osteoporosis without current pathological fracture: Secondary | ICD-10-CM | POA: Diagnosis not present

## 2022-04-26 DIAGNOSIS — Z170 Estrogen receptor positive status [ER+]: Secondary | ICD-10-CM

## 2022-04-26 MED ORDER — SODIUM CHLORIDE 0.9 % IV SOLN: Freq: Once | INTRAVENOUS | Status: AC

## 2022-04-26 MED ORDER — ZOLEDRONIC ACID 4 MG/100ML IV SOLN
4.0000 mg | Freq: Once | INTRAVENOUS | Status: AC
  Administered 2022-04-26: 4 mg via INTRAVENOUS
  Filled 2022-04-26: qty 100

## 2022-04-26 NOTE — Progress Notes (Signed)
Dental clearance per note- corrected Ca 9.08 and Scr 0.58

## 2022-04-26 NOTE — Patient Instructions (Signed)

## 2022-05-01 ENCOUNTER — Ambulatory Visit: Payer: Medicaid Other

## 2022-05-02 ENCOUNTER — Emergency Department (HOSPITAL_COMMUNITY): Payer: Medicaid Other

## 2022-05-02 ENCOUNTER — Encounter (HOSPITAL_COMMUNITY): Payer: Self-pay

## 2022-05-02 ENCOUNTER — Other Ambulatory Visit: Payer: Self-pay

## 2022-05-02 ENCOUNTER — Inpatient Hospital Stay (HOSPITAL_COMMUNITY)
Admission: EM | Admit: 2022-05-02 | Discharge: 2022-05-05 | DRG: 191 | Disposition: A | Discharge status: Home / Self Care | Payer: Medicaid Other | Attending: Internal Medicine | Admitting: Internal Medicine

## 2022-05-02 DIAGNOSIS — Z79899 Other long term (current) drug therapy: Secondary | ICD-10-CM

## 2022-05-02 DIAGNOSIS — Z8249 Family history of ischemic heart disease and other diseases of the circulatory system: Secondary | ICD-10-CM

## 2022-05-02 DIAGNOSIS — I493 Ventricular premature depolarization: Secondary | ICD-10-CM | POA: Diagnosis present

## 2022-05-02 DIAGNOSIS — Z87891 Personal history of nicotine dependence: Secondary | ICD-10-CM

## 2022-05-02 DIAGNOSIS — I10 Essential (primary) hypertension: Secondary | ICD-10-CM | POA: Diagnosis present

## 2022-05-02 DIAGNOSIS — G4733 Obstructive sleep apnea (adult) (pediatric): Secondary | ICD-10-CM | POA: Diagnosis present

## 2022-05-02 DIAGNOSIS — C50419 Malignant neoplasm of upper-outer quadrant of unspecified female breast: Secondary | ICD-10-CM | POA: Diagnosis present

## 2022-05-02 DIAGNOSIS — Z20822 Contact with and (suspected) exposure to covid-19: Secondary | ICD-10-CM | POA: Diagnosis present

## 2022-05-02 DIAGNOSIS — J441 Chronic obstructive pulmonary disease with (acute) exacerbation: Principal | ICD-10-CM | POA: Diagnosis present

## 2022-05-02 DIAGNOSIS — C50312 Malignant neoplasm of lower-inner quadrant of left female breast: Secondary | ICD-10-CM | POA: Diagnosis present

## 2022-05-02 DIAGNOSIS — Z7951 Long term (current) use of inhaled steroids: Secondary | ICD-10-CM

## 2022-05-02 DIAGNOSIS — I110 Hypertensive heart disease with heart failure: Secondary | ICD-10-CM | POA: Diagnosis present

## 2022-05-02 DIAGNOSIS — Z9011 Acquired absence of right breast and nipple: Secondary | ICD-10-CM

## 2022-05-02 DIAGNOSIS — Z881 Allergy status to other antibiotic agents status: Secondary | ICD-10-CM

## 2022-05-02 DIAGNOSIS — Z803 Family history of malignant neoplasm of breast: Secondary | ICD-10-CM

## 2022-05-02 DIAGNOSIS — J9611 Chronic respiratory failure with hypoxia: Secondary | ICD-10-CM | POA: Diagnosis present

## 2022-05-02 DIAGNOSIS — I5032 Chronic diastolic (congestive) heart failure: Secondary | ICD-10-CM | POA: Diagnosis present

## 2022-05-02 DIAGNOSIS — Z79810 Long term (current) use of selective estrogen receptor modulators (SERMs): Secondary | ICD-10-CM

## 2022-05-02 DIAGNOSIS — Z853 Personal history of malignant neoplasm of breast: Secondary | ICD-10-CM

## 2022-05-02 DIAGNOSIS — E039 Hypothyroidism, unspecified: Secondary | ICD-10-CM | POA: Diagnosis present

## 2022-05-02 LAB — CBC WITH DIFFERENTIAL/PLATELET
Abs Immature Granulocytes: 0.04 10*3/uL (ref 0.00–0.07)
Basophils Absolute: 0.1 10*3/uL (ref 0.0–0.1)
Basophils Relative: 0 %
Eosinophils Absolute: 0.2 10*3/uL (ref 0.0–0.5)
Eosinophils Relative: 1 %
HCT: 41.3 % (ref 36.0–46.0)
Hemoglobin: 13 g/dL (ref 12.0–15.0)
Immature Granulocytes: 0 %
Lymphocytes Relative: 31 %
Lymphs Abs: 4.1 10*3/uL — ABNORMAL HIGH (ref 0.7–4.0)
MCH: 26.9 pg (ref 26.0–34.0)
MCHC: 31.5 g/dL (ref 30.0–36.0)
MCV: 85.3 fL (ref 80.0–100.0)
Monocytes Absolute: 1.1 10*3/uL — ABNORMAL HIGH (ref 0.1–1.0)
Monocytes Relative: 8 %
Neutro Abs: 7.7 10*3/uL (ref 1.7–7.7)
Neutrophils Relative %: 60 %
Platelets: 336 10*3/uL (ref 150–400)
RBC: 4.84 MIL/uL (ref 3.87–5.11)
RDW: 16.5 % — ABNORMAL HIGH (ref 11.5–15.5)
WBC: 13.2 10*3/uL — ABNORMAL HIGH (ref 4.0–10.5)
nRBC: 0 % (ref 0.0–0.2)

## 2022-05-02 LAB — COMPREHENSIVE METABOLIC PANEL
ALT: 22 U/L (ref 0–44)
AST: 21 U/L (ref 15–41)
Albumin: 3.6 g/dL (ref 3.5–5.0)
Alkaline Phosphatase: 84 U/L (ref 38–126)
Anion gap: 4 — ABNORMAL LOW (ref 5–15)
BUN: 7 mg/dL (ref 6–20)
CO2: 26 mmol/L (ref 22–32)
Calcium: 9.5 mg/dL (ref 8.9–10.3)
Chloride: 110 mmol/L (ref 98–111)
Creatinine, Ser: 0.84 mg/dL (ref 0.44–1.00)
GFR, Estimated: >60 mL/min (ref >60)
Glucose, Bld: 112 mg/dL — ABNORMAL HIGH (ref 70–99)
Potassium: 3.8 mmol/L (ref 3.5–5.1)
Sodium: 140 mmol/L (ref 135–145)
Total Bilirubin: 0.9 mg/dL (ref 0.3–1.2)
Total Protein: 7.4 g/dL (ref 6.5–8.1)

## 2022-05-02 LAB — I-STAT VENOUS BLOOD GAS, ED
Acid-base deficit: 1 mmol/L (ref 0.0–2.0)
Bicarbonate: 26.3 mmol/L (ref 20.0–28.0)
Calcium, Ion: 1.23 mmol/L (ref 1.15–1.40)
HCT: 42 % (ref 36.0–46.0)
Hemoglobin: 14.3 g/dL (ref 12.0–15.0)
O2 Saturation: 99 %
Potassium: 3.9 mmol/L (ref 3.5–5.1)
Sodium: 141 mmol/L (ref 135–145)
TCO2: 28 mmol/L (ref 22–32)
pCO2, Ven: 52.8 mmHg (ref 44–60)
pH, Ven: 7.306 (ref 7.25–7.43)
pO2, Ven: 151 mmHg — ABNORMAL HIGH (ref 32–45)

## 2022-05-02 LAB — SARS CORONAVIRUS 2 BY RT PCR: SARS Coronavirus 2 by RT PCR: NEGATIVE

## 2022-05-02 LAB — TROPONIN I (HIGH SENSITIVITY)
Troponin I (High Sensitivity): 3 ng/L (ref <18)
Troponin I (High Sensitivity): 3 ng/L (ref <18)

## 2022-05-02 LAB — D-DIMER, QUANTITATIVE: D-Dimer, Quant: 0.39 ug/mL-FEU (ref 0.00–0.50)

## 2022-05-02 MED ORDER — MAGNESIUM SULFATE 2 GM/50ML IV SOLN
2.0000 g | Freq: Once | INTRAVENOUS | Status: AC
Start: 2022-05-02 — End: 2022-05-02
  Administered 2022-05-02: 2 g via INTRAVENOUS
  Filled 2022-05-02: qty 50

## 2022-05-02 MED ORDER — OXYCODONE-ACETAMINOPHEN 5-325 MG PO TABS
1.0000 | ORAL_TABLET | Freq: Four times a day (QID) | ORAL | Status: DC | PRN
  Administered 2022-05-03 – 2022-05-05 (×7): 1 via ORAL
  Filled 2022-05-02 (×7): qty 1

## 2022-05-02 MED ORDER — LACTATED RINGERS IV SOLN: INTRAVENOUS | Status: DC

## 2022-05-02 MED ORDER — ALBUTEROL SULFATE (2.5 MG/3ML) 0.083% IN NEBU
15.0000 mg | INHALATION_SOLUTION | Freq: Once | RESPIRATORY_TRACT | Status: AC
  Administered 2022-05-02: 15 mg via RESPIRATORY_TRACT
  Filled 2022-05-02: qty 18

## 2022-05-02 MED ORDER — IOHEXOL 300 MG/ML  SOLN
75.0000 mL | Freq: Once | INTRAMUSCULAR | Status: AC | PRN
  Administered 2022-05-02: 75 mL via INTRAVENOUS

## 2022-05-02 NOTE — ED Notes (Signed)
Pt stated she would like to be admitted overnight, MD notified.

## 2022-05-02 NOTE — ED Provider Notes (Signed)
[facility] EMERGENCY DEPARTMENT Provider Note   CSN: [id number] Arrival date & time: 05/02/22  1726     History  Chief Complaint  Patient presents with   Shortness of Breath    Courtney Terry is a 61 y.o. female.  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  Patient reports now for approximately 2-1/2 months she has had gradually worsening shortness of breath.  She reports it started after she had COVID but she has progressively been getting worse.  She is had a productive cough that initially was clear then went to yellow then went to dark green and now she is coughing up limegreen colored sputum.  She did see Dr. Radene Gunning her pulmonologist and had been given a prescription for azithromycin which did not help.  She then followed up with her PCP 3 weeks ago and was given another prescription for antibiotics but reports it also did not help.  She feels like her symptoms are getting worse.  She was wearing a BiPAP mask at night but felt like it was worse when she had that on so she has stopped using that.  She has felt intermittently feverish with chills but is not consistent.  She does report last week 1 episode of vomiting after getting her Zometa infusion but has had no further vomiting.  She reports her chest feels sore but she is not having any localized chest pain.  She has not noticed any swelling in her legs and denies any abdominal pain.  EMS reports when they arrived patient sats were normal but she had severely diminished breath sounds and increased work of breathing.  She was given a DuoNeb, 5 additional milligrams of albuterol and 125 mg of Solu-Medrol.  Patient reports minimal improvement with  these interventions.  The history is provided by the patient, medical records and the EMS personnel.  Shortness of Breath      Home Medications Prior to Admission medications   Medication Sig Start Date End Date Taking? Authorizing Provider  albuterol (PROVENTIL) (2.5 MG/3ML) 0.083% nebulizer solution Take 3 mLs (2.5 mg total) by nebulization every 6 (six) hours as needed for wheezing or shortness of breath. 05/02/21   Rigoberto Noel, MD  albuterol (VENTOLIN HFA) 108 (90 Base) MCG/ACT inhaler TAKE 2 PUFFS BY MOUTH EVERY 6 HOURS AS NEEDED FOR WHEEZE OR SHORTNESS OF BREATH 10/22/21   Rigoberto Noel, MD  amLODipine (NORVASC) 5 MG tablet Take 5 mg by mouth daily. 03/17/21   [provider]  azithromycin (ZITHROMAX Z-PAK) 250 MG tablet Take as directed 03/13/22   Rigoberto Noel, MD  [employer] STOOL SOFTENER 100 MG capsule Take by mouth daily as needed. 02/01/21   [provider]  fluticasone (FLONASE) 50 MCG/ACT nasal spray Place 1 spray into both nostrils daily. 06/30/20   Martyn Ehrich, NP  Fluticasone-Umeclidin-Vilant (TRELEGY ELLIPTA) 200-62.5-25 MCG/ACT AEPB Inhale 1 puff into the lungs daily. 01/30/22   Rigoberto Noel, MD  Fluticasone-Umeclidin-Vilant 200-62.5-25 MCG/ACT AEPB Inhale 1 puff into the lungs daily. 02/13/22   Rigoberto Noel, MD  hydrOXYzine (VISTARIL) 50 MG capsule Take 50 mg by mouth at bedtime. 03/23/21   [provider]  levothyroxine (SYNTHROID, LEVOTHROID) 75 MCG tablet  Take 1 tablet (75 mcg total) by mouth daily. 12/24/18   Angiulli, Lavon Paganini, PA-C  LINZESS 145 MCG CAPS capsule Take 145 mcg by mouth daily. 10/30/20   [provider]  losartan (COZAAR) 25 MG tablet Take 25 mg by mouth daily. 04/14/20   [provider]  methocarbamol (ROBAXIN) 500 MG tablet Take 1 tablet (500 mg total) by mouth every 8 (eight) hours as needed for muscle spasms. 04/25/20   Irene Limbo, MD  naloxone [facility]) nasal spray 4 mg/0.1 mL SMARTSIG:1 Spray(s) Both  Nares PRN 03/22/21   [provider]  ondansetron (ZOFRAN) 8 MG tablet Take 1 tablet (8 mg total) by mouth every 8 (eight) hours as needed for nausea or vomiting. 04/20/21   Heilingoetter, Cassandra L, PA-C  oxyCODONE-acetaminophen (PERCOCET) 7.5-325 MG tablet Take 1 tablet by mouth 5 (five) times daily as needed. 04/04/21   [provider]  predniSONE (DELTASONE) 10 MG tablet 2 daily x 5 days, 1 x 5 days and then stop 03/13/22   Rigoberto Noel, MD  predniSONE (DELTASONE) 5 MG tablet TAKE 1 TABLET BY MOUTH Mon, Wed, Fri through Mar 06, 2022 then stop 02/22/22   Rigoberto Noel, MD  tamoxifen (NOLVADEX) 20 MG tablet Take 1 tablet (20 mg total) by mouth daily. 04/22/22   Truitt Merle, MD  tiZANidine (ZANAFLEX) 4 MG tablet Take 4 mg by mouth every 8 (eight) hours. 04/06/20   [provider]  Vitamin D, Ergocalciferol, (DRISDOL) 1.25 MG (50000 UNIT) CAPS capsule Take 50,000 Units by mouth once a week. 03/23/21   [provider]      Allergies    Levaquin [levofloxacin]    Review of Systems   Review of Systems  Respiratory:  Positive for shortness of breath.     Physical Exam Updated Vital Signs BP (!) 139/119   Pulse 84   Temp (!) 97.4 F (36.3 C) (Oral)   Resp 15   Ht '5\' 9"'$  (1.753 m)   Wt 93 kg   SpO2 96%   BMI 30.27 kg/m  Physical Exam Vitals and nursing note reviewed.  Constitutional:      General: She is in acute distress.     Appearance: She is well-developed.  HENT:     Head: Normocephalic and atraumatic.  Eyes:     Pupils: Pupils are equal, round, and reactive to light.  Cardiovascular:     Rate and Rhythm: Normal rate and regular rhythm.     Pulses: Normal pulses.     Heart sounds: Normal heart sounds. No murmur heard.    No friction rub.  Pulmonary:     Effort: Pulmonary effort is normal.     Breath sounds: Decreased breath sounds present. No wheezing or rales.     Comments: Significantly decreased breath sounds in all lung fields.  Only  minimal sounds heard in the right upper field.  No notable wheezing or crackles.  Increased work of breathing with accessory muscle use and tachypnea Abdominal:     General: Bowel sounds are normal. There is no distension.     Palpations: Abdomen is soft.     Tenderness: There is no abdominal tenderness. There is no guarding or rebound.  Musculoskeletal:        General: No tenderness. Normal range of motion.     Right lower leg: No edema.     Left lower leg: No edema.     Comments: No edema  Skin:    General: Skin is  warm and dry.     Findings: No rash.  Neurological:     Mental Status: She is alert and oriented to person, place, and time. Mental status is at baseline.     Cranial Nerves: No cranial nerve deficit.  Psychiatric:        Mood and Affect: Mood normal.        Behavior: Behavior normal.     ED Results / Procedures / Treatments   Labs (all labs ordered are listed, but only abnormal results are displayed) Labs Reviewed  CBC WITH DIFFERENTIAL/PLATELET - Abnormal; Notable for the following components:      Result Value   WBC 13.2 (*)    RDW 16.5 (*)    Lymphs Abs 4.1 (*)    Monocytes Absolute 1.1 (*)    All other components within normal limits  COMPREHENSIVE METABOLIC PANEL - Abnormal; Notable for the following components:   Glucose, Bld 112 (*)    Anion gap 4 (*)    All other components within normal limits  I-STAT VENOUS BLOOD GAS, ED - Abnormal; Notable for the following components:   pO2, Ven 151 (*)    All other components within normal limits  SARS CORONAVIRUS 2 BY RT PCR  D-DIMER, QUANTITATIVE  TROPONIN I (HIGH SENSITIVITY)  TROPONIN I (HIGH SENSITIVITY)    EKG None ED ECG REPORT   Date: 05/02/2022  Rate: 98  Rhythm: normal sinus rhythm and premature ventricular contractions (PVC)  QRS Axis: right  Intervals: normal  ST/T Wave abnormalities: nonspecific ST changes  Conduction Disutrbances:none  Narrative Interpretation:   Old EKG Reviewed:  unchanged  I have personally reviewed the EKG tracing and agree with the computerized printout as noted.   Radiology CT Chest W Contrast  Result Date: 05/02/2022 CLINICAL DATA:  Productive cough and shortness of breath. EXAM: CT CHEST WITH CONTRAST TECHNIQUE: Multidetector CT imaging of the chest was performed during intravenous contrast administration. RADIATION DOSE REDUCTION: This exam was performed according to the departmental dose-optimization program which includes automated exposure control, adjustment of the mA and/or kV according to patient size and/or use of iterative reconstruction technique. CONTRAST:  78m OMNIPAQUE IOHEXOL 300 MG/ML  SOLN COMPARISON:  November 15, 2019 FINDINGS: Cardiovascular: There is mild calcification of the aortic arch, without evidence of aortic aneurysm. Normal heart size. No pericardial effusion. Mediastinum/Nodes: No enlarged mediastinal, hilar, or axillary lymph nodes. Surgical clips are seen within the right axilla. Thyroid gland, trachea, and esophagus demonstrate no significant findings. Lungs/Pleura: Moderate severity emphysematous lung disease is seen involving predominantly the bilateral upper lobes. Stable 4 mm noncalcified lung nodules are seen within the anterior aspect of the right lower lobe (axial CT image 87, CT series 5) and posterior aspect of the left lower lobe (axial CT images 87, 127 and 137 CT series 5). A stable 4 mm noncalcified lung nodule is seen within the lateral aspect of the right lung base (axial CT image 134, CT series 5). There is mild, stable lingular scarring and/or atelectasis. There is no evidence of acute infiltrate, pleural effusion or pneumothorax. Upper Abdomen: No acute abnormality. Musculoskeletal: A right breast prosthesis is seen. The left breast prosthesis seen on the prior study has been removed. A residual 4.7 cm x 1.7 cm area of fluid attenuation (approximately 1.76 Hounsfield units), with thin surrounding mildly  hyperdense rim, is seen within the lower inner quadrant of the left breast, along the ribcage (axial CT image 63, CT series 3). Mild, chronic loss of vertebral body  height is seen within the midthoracic spine. IMPRESSION: 1. Moderate severity emphysematous lung disease. 2. Multiple stable, likely benign bilateral 4 mm noncalcified lung nodules. 3. Interval removal of the left breast prosthesis seen on the prior study. 4. 4.7 cm x 1.7 cm area of fluid attenuation within the lower inner quadrant of the left breast, along the ribcage. While this may represent a post procedural seroma related to breast prosthesis removal, correlation with MRI is recommended. Aortic Atherosclerosis (ICD10-I70.0) and Emphysema (ICD10-J43.9). Electronically Signed   By: Virgina Norfolk M.D.   On: 05/02/2022 20:22   DG Chest Port 1 View  Result Date: 05/02/2022 CLINICAL DATA:  Shortness of breath EXAM: PORTABLE CHEST 1 VIEW COMPARISON:  01/22/2020 FINDINGS: Hyperinflation with emphysema. No acute airspace disease, pleural effusion or pneumothorax. Stable cardiomediastinal silhouette with aortic atherosclerosis. No pneumothorax. Postsurgical changes of the right breast and axilla. IMPRESSION: Hyperinflation with emphysema.  No acute airspace disease. Electronically Signed   By: Donavan Foil M.D.   On: 05/02/2022 18:39    Procedures Procedures    Medications Ordered in ED Medications  lactated ringers infusion ( Intravenous New Bag/Given 05/02/22 1753)  magnesium sulfate IVPB 2 g 50 mL (0 g Intravenous Stopped 05/02/22 1912)  iohexol (OMNIPAQUE) 300 MG/ML solution 75 mL (75 mLs Intravenous Contrast Given 05/02/22 2006)  albuterol (PROVENTIL) (2.5 MG/3ML) 0.083% nebulizer solution 15 mg (15 mg Nebulization Given 05/02/22 2112)    ED Course/ Medical Decision Making/ A&P                           Medical Decision Making Amount and/or Complexity of Data Reviewed External Data Reviewed: notes.    Details: pulm and  oncology Labs: ordered. Decision-making details documented in ED Course. Radiology: ordered and independent interpretation performed. Decision-making details documented in ED Course. ECG/medicine tests: ordered and independent interpretation performed. Decision-making details documented in ED Course.  Risk Prescription drug management.   Pt with multiple medical problems and comorbidities and presenting today with a complaint that caries a high risk for morbidity and mortality.  Here today with shortness of breath.  Given patient's symptoms concern for persistent multifocal pneumonia versus COPD exacerbation.  Lower suspicion for PE or pulmonary metastases from her stable breast cancer.  Patient is low risk Wells criteria.  Low suspicion for pneumothorax, ACS or CHF.  Patient was given magnesium.  She is finishing albuterol neb at this time.  Labs and imaging are pending. 10:13 PM I independently interpreted patient's labs and EKG.  Patient's EKG today has no significant change from prior EKGs.  She has occasional PVCs but no significant changes.  Patient's venous blood gas today is within normal limits, CBC with mild leukocytosis of 13 but normal hemoglobin and platelet count, CMP without acute findings, troponin and D-dimer both within normal limits.  I have independently visualized and interpreted pt's images today.  Chest x-ray without acute findings however given patient's history of COPD, prolonged course of illness, worsening symptoms will do a CT of the chest to ensure no abnormalities that are getting missed such as multifocal pneumonia.  10:13 PM CT is negative for acute findings.  Radiology reports moderate severity emphysematous lung disease, multiple stable benign nodes and a 4.7 cm x 1.7 cm area of fluid attenuation in the breast but patient does follow with oncology for this and they were following her regularly.  On repeat evaluation patient still has poor air movement throughout.  She  still  has some accessory muscle use and still reports feeling short of breath.  Offered patient admission however she wants to see how she feels after an hour-long continuous neb.  She reports she does not want to be admitted.  10:13 PM On reevaluation patient is still short of breath after an hour-long But now is wheezing some.  She is still having some accessory muscle use.  At this time patient is agreeable to stay.  We will continue treatments.  CRITICAL CARE Performed by: Crystallynn Noorani Total critical care time: 45 minutes Critical care time was exclusive of separately billable procedures and treating other patients. Critical care was necessary to treat or prevent imminent or life-threatening deterioration. Critical care was time spent personally by me on the following activities: development of treatment plan with patient and/or surrogate as well as nursing, discussions with consultants, evaluation of patient's response to treatment, examination of patient, obtaining history from patient or surrogate, ordering and performing treatments and interventions, ordering and review of laboratory studies, ordering and review of radiographic studies, pulse oximetry and re-evaluation of patient's condition.         Final Clinical Impression(s) / ED Diagnoses Final diagnoses:  COPD exacerbation [facility])    Rx / DC Orders ED Discharge Orders     None         Blanchie Dessert, MD 05/02/22 2213

## 2022-05-02 NOTE — ED Triage Notes (Signed)
"  History of COPD, battling a cold for a couple of months, recently finished antibiotics for pneumonia, but still coughing up green sputum and having shortness of breath" per EMS 2 breathing treatments & solu-medrol IV given enroute via EMS

## 2022-05-02 NOTE — ED Notes (Signed)
Respirations unlabored at this time. Patient able to speak full sentences. No complaints. No signs of distress.

## 2022-05-02 NOTE — ED Notes (Signed)
Patient transported to CT 

## 2022-05-03 ENCOUNTER — Encounter (HOSPITAL_COMMUNITY): Payer: Self-pay | Admitting: Internal Medicine

## 2022-05-03 DIAGNOSIS — Z87891 Personal history of nicotine dependence: Secondary | ICD-10-CM | POA: Diagnosis not present

## 2022-05-03 DIAGNOSIS — Z20822 Contact with and (suspected) exposure to covid-19: Secondary | ICD-10-CM | POA: Diagnosis present

## 2022-05-03 DIAGNOSIS — C50312 Malignant neoplasm of lower-inner quadrant of left female breast: Secondary | ICD-10-CM | POA: Diagnosis present

## 2022-05-03 DIAGNOSIS — J441 Chronic obstructive pulmonary disease with (acute) exacerbation: Secondary | ICD-10-CM | POA: Diagnosis not present

## 2022-05-03 DIAGNOSIS — G4733 Obstructive sleep apnea (adult) (pediatric): Secondary | ICD-10-CM | POA: Diagnosis present

## 2022-05-03 DIAGNOSIS — I10 Essential (primary) hypertension: Secondary | ICD-10-CM | POA: Diagnosis not present

## 2022-05-03 DIAGNOSIS — C50411 Malignant neoplasm of upper-outer quadrant of right female breast: Secondary | ICD-10-CM

## 2022-05-03 DIAGNOSIS — I493 Ventricular premature depolarization: Secondary | ICD-10-CM | POA: Diagnosis present

## 2022-05-03 DIAGNOSIS — J9611 Chronic respiratory failure with hypoxia: Secondary | ICD-10-CM

## 2022-05-03 DIAGNOSIS — I5032 Chronic diastolic (congestive) heart failure: Secondary | ICD-10-CM | POA: Diagnosis present

## 2022-05-03 DIAGNOSIS — Z9011 Acquired absence of right breast and nipple: Secondary | ICD-10-CM | POA: Diagnosis not present

## 2022-05-03 DIAGNOSIS — Z803 Family history of malignant neoplasm of breast: Secondary | ICD-10-CM | POA: Diagnosis not present

## 2022-05-03 DIAGNOSIS — E039 Hypothyroidism, unspecified: Secondary | ICD-10-CM | POA: Diagnosis present

## 2022-05-03 DIAGNOSIS — Z79810 Long term (current) use of selective estrogen receptor modulators (SERMs): Secondary | ICD-10-CM | POA: Diagnosis not present

## 2022-05-03 DIAGNOSIS — Z170 Estrogen receptor positive status [ER+]: Secondary | ICD-10-CM

## 2022-05-03 DIAGNOSIS — Z8249 Family history of ischemic heart disease and other diseases of the circulatory system: Secondary | ICD-10-CM | POA: Diagnosis not present

## 2022-05-03 DIAGNOSIS — Z79899 Other long term (current) drug therapy: Secondary | ICD-10-CM | POA: Diagnosis not present

## 2022-05-03 DIAGNOSIS — Z853 Personal history of malignant neoplasm of breast: Secondary | ICD-10-CM | POA: Diagnosis not present

## 2022-05-03 DIAGNOSIS — Z881 Allergy status to other antibiotic agents status: Secondary | ICD-10-CM | POA: Diagnosis not present

## 2022-05-03 DIAGNOSIS — I110 Hypertensive heart disease with heart failure: Secondary | ICD-10-CM | POA: Diagnosis present

## 2022-05-03 DIAGNOSIS — Z7951 Long term (current) use of inhaled steroids: Secondary | ICD-10-CM | POA: Diagnosis not present

## 2022-05-03 LAB — CBC WITH DIFFERENTIAL/PLATELET
Abs Immature Granulocytes: 0.01 10*3/uL (ref 0.00–0.07)
Basophils Absolute: 0 10*3/uL (ref 0.0–0.1)
Basophils Relative: 0 %
Eosinophils Absolute: 0 10*3/uL (ref 0.0–0.5)
Eosinophils Relative: 0 %
HCT: 40.8 % (ref 36.0–46.0)
Hemoglobin: 12.3 g/dL (ref 12.0–15.0)
Immature Granulocytes: 0 %
Lymphocytes Relative: 8 %
Lymphs Abs: 0.7 10*3/uL (ref 0.7–4.0)
MCH: 26.3 pg (ref 26.0–34.0)
MCHC: 30.1 g/dL (ref 30.0–36.0)
MCV: 87.4 fL (ref 80.0–100.0)
Monocytes Absolute: 0 10*3/uL — ABNORMAL LOW (ref 0.1–1.0)
Monocytes Relative: 0 %
Neutro Abs: 8.4 10*3/uL — ABNORMAL HIGH (ref 1.7–7.7)
Neutrophils Relative %: 92 %
Platelets: 332 10*3/uL (ref 150–400)
RBC: 4.67 MIL/uL (ref 3.87–5.11)
RDW: 16.8 % — ABNORMAL HIGH (ref 11.5–15.5)
WBC: 9.2 10*3/uL (ref 4.0–10.5)
nRBC: 0 % (ref 0.0–0.2)

## 2022-05-03 LAB — COMPREHENSIVE METABOLIC PANEL
ALT: 22 U/L (ref 0–44)
AST: 23 U/L (ref 15–41)
Albumin: 3.6 g/dL (ref 3.5–5.0)
Alkaline Phosphatase: 87 U/L (ref 38–126)
Anion gap: 8 (ref 5–15)
BUN: 8 mg/dL (ref 6–20)
CO2: 22 mmol/L (ref 22–32)
Calcium: 9.5 mg/dL (ref 8.9–10.3)
Chloride: 110 mmol/L (ref 98–111)
Creatinine, Ser: 1.07 mg/dL — ABNORMAL HIGH (ref 0.44–1.00)
GFR, Estimated: 59 mL/min — ABNORMAL LOW (ref >60)
Glucose, Bld: 192 mg/dL — ABNORMAL HIGH (ref 70–99)
Potassium: 4.3 mmol/L (ref 3.5–5.1)
Sodium: 140 mmol/L (ref 135–145)
Total Bilirubin: 0.4 mg/dL (ref 0.3–1.2)
Total Protein: 7.5 g/dL (ref 6.5–8.1)

## 2022-05-03 LAB — GLUCOSE, CAPILLARY
Glucose-Capillary: 187 mg/dL — ABNORMAL HIGH (ref 70–99)
Glucose-Capillary: 164 mg/dL — ABNORMAL HIGH (ref 70–99)

## 2022-05-03 LAB — MAGNESIUM: Magnesium: 2.1 mg/dL (ref 1.7–2.4)

## 2022-05-03 LAB — HIV ANTIBODY (ROUTINE TESTING W REFLEX): HIV Screen 4th Generation wRfx: NONREACTIVE

## 2022-05-03 MED ORDER — FLUTICASONE FUROATE-VILANTEROL 200-25 MCG/ACT IN AEPB
1.0000 | INHALATION_SPRAY | Freq: Every day | RESPIRATORY_TRACT | Status: DC
  Administered 2022-05-03 – 2022-05-05 (×4): 1 via RESPIRATORY_TRACT
  Filled 2022-05-03: qty 28

## 2022-05-03 MED ORDER — IPRATROPIUM-ALBUTEROL 0.5-2.5 (3) MG/3ML IN SOLN: 3.0000 mL | RESPIRATORY_TRACT | Status: DC | PRN

## 2022-05-03 MED ORDER — METHYLPREDNISOLONE SODIUM SUCC 125 MG IJ SOLR
120.0000 mg | INTRAMUSCULAR | Status: DC
  Administered 2022-05-03 – 2022-05-04 (×3): 120 mg via INTRAVENOUS
  Filled 2022-05-03 (×3): qty 2

## 2022-05-03 MED ORDER — ENOXAPARIN SODIUM 40 MG/0.4ML IJ SOSY
40.0000 mg | PREFILLED_SYRINGE | INTRAMUSCULAR | Status: DC
  Administered 2022-05-03 – 2022-05-04 (×3): 40 mg via SUBCUTANEOUS
  Filled 2022-05-03 (×3): qty 0.4

## 2022-05-03 MED ORDER — UMECLIDINIUM BROMIDE 62.5 MCG/ACT IN AEPB
1.0000 | INHALATION_SPRAY | Freq: Every day | RESPIRATORY_TRACT | Status: DC
  Administered 2022-05-03 – 2022-05-05 (×4): 1 via RESPIRATORY_TRACT
  Filled 2022-05-03: qty 7

## 2022-05-03 MED ORDER — HYDRALAZINE HCL 20 MG/ML IJ SOLN: 10.0000 mg | Freq: Four times a day (QID) | INTRAMUSCULAR | Status: DC | PRN

## 2022-05-03 MED ORDER — TIZANIDINE HCL 4 MG PO TABS
4.0000 mg | ORAL_TABLET | Freq: Three times a day (TID) | ORAL | Status: DC
  Administered 2022-05-03 – 2022-05-05 (×6): 4 mg via ORAL
  Filled 2022-05-03 (×7): qty 1

## 2022-05-03 MED ORDER — SODIUM CHLORIDE 0.9 % IV SOLN
100.0000 mg | Freq: Two times a day (BID) | INTRAVENOUS | Status: DC
  Administered 2022-05-03 – 2022-05-05 (×5): 100 mg via INTRAVENOUS
  Filled 2022-05-03 (×7): qty 100

## 2022-05-03 MED ORDER — FLUTICASONE PROPIONATE 50 MCG/ACT NA SUSP
1.0000 | Freq: Every day | NASAL | Status: DC
  Administered 2022-05-03 – 2022-05-05 (×3): 1 via NASAL
  Filled 2022-05-03: qty 16

## 2022-05-03 MED ORDER — LEVOTHYROXINE SODIUM 75 MCG PO TABS
75.0000 ug | ORAL_TABLET | Freq: Every day | ORAL | Status: DC
  Administered 2022-05-03 – 2022-05-05 (×3): 75 ug via ORAL
  Filled 2022-05-03 (×3): qty 1

## 2022-05-03 MED ORDER — IPRATROPIUM-ALBUTEROL 0.5-2.5 (3) MG/3ML IN SOLN
3.0000 mL | Freq: Four times a day (QID) | RESPIRATORY_TRACT | Status: DC
  Administered 2022-05-03 – 2022-05-04 (×6): 3 mL via RESPIRATORY_TRACT
  Filled 2022-05-03 (×5): qty 3

## 2022-05-03 MED ORDER — TAMOXIFEN CITRATE 10 MG PO TABS
20.0000 mg | ORAL_TABLET | Freq: Every day | ORAL | Status: DC
  Administered 2022-05-03 – 2022-05-05 (×3): 20 mg via ORAL
  Filled 2022-05-03 (×3): qty 2
  Filled 2022-05-03: qty 1

## 2022-05-03 MED ORDER — SODIUM CHLORIDE 0.9 % IV SOLN: INTRAVENOUS | Status: DC | PRN

## 2022-05-03 MED ORDER — ONDANSETRON HCL 4 MG PO TABS: 4.0000 mg | ORAL_TABLET | Freq: Four times a day (QID) | ORAL | Status: DC | PRN

## 2022-05-03 MED ORDER — ACETAMINOPHEN 325 MG PO TABS: 650.0000 mg | ORAL_TABLET | Freq: Four times a day (QID) | ORAL | Status: DC | PRN

## 2022-05-03 MED ORDER — AMLODIPINE BESYLATE 5 MG PO TABS
5.0000 mg | ORAL_TABLET | Freq: Every day | ORAL | Status: DC
  Administered 2022-05-03 – 2022-05-05 (×3): 5 mg via ORAL
  Filled 2022-05-03 (×3): qty 1

## 2022-05-03 MED ORDER — ACETAMINOPHEN 650 MG RE SUPP: 650.0000 mg | Freq: Four times a day (QID) | RECTAL | Status: DC | PRN

## 2022-05-03 MED ORDER — POLYETHYLENE GLYCOL 3350 17 G PO PACK
17.0000 g | PACK | Freq: Every day | ORAL | Status: DC | PRN
Start: 2022-05-03 — End: 2022-05-05

## 2022-05-03 MED ORDER — LOSARTAN POTASSIUM 25 MG PO TABS
25.0000 mg | ORAL_TABLET | Freq: Every day | ORAL | Status: DC
  Administered 2022-05-03 – 2022-05-05 (×3): 25 mg via ORAL
  Filled 2022-05-03 (×3): qty 1

## 2022-05-03 MED ORDER — ONDANSETRON HCL 4 MG/2ML IJ SOLN: 4.0000 mg | Freq: Four times a day (QID) | INTRAMUSCULAR | Status: DC | PRN

## 2022-05-03 NOTE — Progress Notes (Signed)
Same day note  Patient seen and examined at bedside.  Patient was admitted to the hospital for shortness of breath, dyspnea.  At the time of my evaluation, patient complains of cough and shortness of breath.  Physical examination reveals obese female, on nasal cannula oxygen, tachypneic, coarse breath sounds noted bilaterally  Body mass index is 30.27 kg/m.   Laboratory data and imaging was reviewed  Assessment and Plan.  COPD with acute exacerbation ([place]) Presented with worsening shortness of breath and wheezing.  Chest x-ray with no evidence of pneumonia.  Continue bronchodilators, Solu-Medrol.  Continue empiric doxycycline for bronchitis.  Patient still very tachypneic, dyspneic and symptomatic.  We will continue current level of care.    Chronic respiratory failure with hypoxia (HCC) On 2 L of oxygen at home.  We will continue to monitor.  Chronic diastolic CHF (congestive heart failure) (HCC) Continue losartan from home.  Volume status appears okay at this time continue to monitor.  Daily intake and output charting Daily weights.    Essential hypertension Continue amlodipine and losartan from home.  Hypothyroidism Continue Synthroid   Malignant neoplasm of upper-outer quadrant of female breast ([place]) Status post lumpectomy with recurrence now mastectomy.  Patient follows up with Dr Burr Medico in the outpatient setting.     No Charge  Signed,  Delila Pereyra, MD [facility]

## 2022-05-03 NOTE — Assessment & Plan Note (Signed)
·   Continue home regimen of Synthroid °

## 2022-05-03 NOTE — Assessment & Plan Note (Addendum)
   Diagnosed 2013  Originally status post lumpectomy with recurrence  Now status post mastectomy  Seroma in the area of the previous left breast implant noted, patient has been advised to obtain MRI in the outpatient setting after discharge.  Patient follows with Dr. Burr Medico in the outpatient setting.  We will add her to the treatment team in case she wishes to come and provide her input on the case.  Any assistance is appreciated.
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
## 2022-05-03 NOTE — Assessment & Plan Note (Signed)
   Patient presenting with rapidly worsening shortness of breath with associated wheezing  Physical exam most consistent with COPD exacerbation with notable wheezing and prolonged expiratory phase  Chest x-ray reveals no evidence of concurrent pneumonia  Initiating aggressive bronchodilator therapy with intravenous Solu-Medrol  Supplemental oxygen initiated with target oxygen saturations of between 89 to 92%.  Patient's ongoing worsening cough is concerning for a concurrent acute bacterial bronchitis and therefore patient will be treated with Doxycycline as well  Close clinical monitoring as patient is at high risk of rapid clinical decompensation.

## 2022-05-03 NOTE — Assessment & Plan Note (Signed)
.   Resume patients home regimen of oral antihypertensives . Titrate antihypertensive regimen as necessary to achieve adequate BP control . PRN intravenous antihypertensives for excessively elevated blood pressure   

## 2022-05-03 NOTE — Assessment & Plan Note (Signed)
   Longstanding need for supplemental oxygen with patient being on 2 L of oxygen via nasal cannula at home  We will continue to provide this and titrate as necessary to achieve oxygen saturations of between 89 and 92%

## 2022-05-03 NOTE — Assessment & Plan Note (Signed)
No clinical evidence of cardiogenic volume overload Strict input and output monitoring Daily weights Low-sodium diet  

## 2022-05-04 DIAGNOSIS — I5032 Chronic diastolic (congestive) heart failure: Secondary | ICD-10-CM | POA: Diagnosis not present

## 2022-05-04 DIAGNOSIS — J9611 Chronic respiratory failure with hypoxia: Secondary | ICD-10-CM | POA: Diagnosis not present

## 2022-05-04 DIAGNOSIS — J441 Chronic obstructive pulmonary disease with (acute) exacerbation: Secondary | ICD-10-CM | POA: Diagnosis not present

## 2022-05-04 DIAGNOSIS — I10 Essential (primary) hypertension: Secondary | ICD-10-CM | POA: Diagnosis not present

## 2022-05-04 LAB — BASIC METABOLIC PANEL
Anion gap: 3 — ABNORMAL LOW (ref 5–15)
BUN: 13 mg/dL (ref 6–20)
CO2: 25 mmol/L (ref 22–32)
Calcium: 9.2 mg/dL (ref 8.9–10.3)
Chloride: 110 mmol/L (ref 98–111)
Creatinine, Ser: 0.94 mg/dL (ref 0.44–1.00)
GFR, Estimated: >60 mL/min (ref >60)
Glucose, Bld: 129 mg/dL — ABNORMAL HIGH (ref 70–99)
Potassium: 4.1 mmol/L (ref 3.5–5.1)
Sodium: 138 mmol/L (ref 135–145)

## 2022-05-04 LAB — CBC
HCT: 33.1 % — ABNORMAL LOW (ref 36.0–46.0)
Hemoglobin: 10.6 g/dL — ABNORMAL LOW (ref 12.0–15.0)
MCH: 27.4 pg (ref 26.0–34.0)
MCHC: 32 g/dL (ref 30.0–36.0)
MCV: 85.5 fL (ref 80.0–100.0)
Platelets: 296 10*3/uL (ref 150–400)
RBC: 3.87 MIL/uL (ref 3.87–5.11)
RDW: 17 % — ABNORMAL HIGH (ref 11.5–15.5)
WBC: 20 10*3/uL — ABNORMAL HIGH (ref 4.0–10.5)
nRBC: 0 % (ref 0.0–0.2)

## 2022-05-04 LAB — MAGNESIUM: Magnesium: 1.7 mg/dL (ref 1.7–2.4)

## 2022-05-04 MED ORDER — FUROSEMIDE 10 MG/ML IJ SOLN
40.0000 mg | Freq: Once | INTRAMUSCULAR | Status: AC
  Administered 2022-05-04: 40 mg via INTRAVENOUS
  Filled 2022-05-04: qty 4

## 2022-05-04 NOTE — Progress Notes (Signed)
PROGRESS NOTE    Courtney Terry  BSJ:[id number] DOB: 22-Feb-1961 DOA: 05/02/2022 PCP: [facility]    Brief Narrative:   61 year old female with past medical history of right breast cancer (Dx 2013 with recurrence, now S/P right mastectomy, follows with Dr. Burr Medico), chronic hypoxic respiratory failure  (on 2lpm via [place] at home), COPD, diastolic congestive heart failure (Echo 11/2017 EF 65-70% with G1DD),  cocaine abuse (currently abstinent), hypertension, hypothyroidism, OSA who presented to the hospital with complaints of shortness of breath.  Patient stated that she had been having shortness of breath and dyspnea for the last 2 months worse with exertion.  She had presented to multiple outpatient providers in the meantime and had multiple courses of steroids and antibiotic without improvement.  Patient then decided to come to hospital and was given magnesium and bronchodilator therapy and was admitted hospital for further evaluation and treatment    Assessment and Plan: Principal Problem:   COPD with acute exacerbation ([place]) Active Problems:   Chronic respiratory failure with hypoxia ([facility])   Chronic diastolic CHF (congestive heart failure) ([place])   Essential hypertension   Hypothyroidism   Malignant neoplasm of upper-outer quadrant of female breast ([place])   Acute exacerbation of chronic obstructive pulmonary disease (COPD) ([place])   COPD with acute exacerbation Presented with worsening shortness of breath and wheezing for prolonged duration..  Chest x-ray with no evidence of pneumonia.  Continue bronchodilators, Solu-Medrol.  Continue empiric doxycycline for bronchitis.  Patient still very tachypneic, dyspneic and symptomatic desaturated while on exertion in the 80s despite being on supplemental oxygen.  We will continue current level of care.   Chronic respiratory failure with hypoxia On 2 L of oxygen at home.  We will continue to monitor.  Had desaturation in the ED while ambulating  today.  Continue supportive care bronchodilators incentive spirometry and IV steroids for now.   Chronic diastolic CHF (congestive heart failure)  Continue losartan from home.  Volume status appears okay at this time, continue to monitor.  Continue daily intake and output charting Daily weights.    Essential hypertension Continue amlodipine and losartan from home.   Hypothyroidism Continue Synthroid   Malignant neoplasm of upper-outer quadrant of female breast ([place]) Status post lumpectomy with recurrence -now status post mastectomy.  Patient follows up with Dr Burr Medico in the outpatient setting.       DVT prophylaxis: enoxaparin (LOVENOX) injection 40 mg Start: 05/03/22 0045   Code Status:     Code Status: Full Code  Disposition: Home likely on 05/05/2022  Status is: Inpatient  Remains inpatient appropriate because: COPD exacerbation, requiring IV steroids, pending clinical improvement.   Family Communication: Communicated with the significant other at bedside  Consultants:  None  Procedures:  None  Antimicrobials:  Doxycycline  Anti-infectives (From admission, onward)    Start     Dose/Rate Route Frequency Ordered Stop   05/03/22 0045  doxycycline (VIBRAMYCIN) 100 mg in sodium chloride 0.9 % 250 mL IVPB        100 mg 125 mL/hr over 120 Minutes Intravenous Every 12 hours 05/03/22 0044        Subjective: Today, patient was seen and examined at bedside.  Complains of cough dyspnea especially on exertion.  Patient is very short winded on ambulation and desaturated to 80s on supplemental oxygen today.  Objective: Vitals:   05/04/22 0046 05/04/22 0312 05/04/22 0827 05/04/22 1201  BP: 102/80 102/67  130/81  Pulse: 87 84 82 89  Resp: 18 18  17 18  Temp: 98.3 F (36.8 C) 98.5 F (36.9 C)  99.1 F (37.3 C)  TempSrc: Oral Oral  Oral  SpO2:    98%  Weight:      Height:        Intake/Output Summary (Last 24 hours) at 05/04/2022 1509 Last data filed at 05/04/2022  1134 Gross per 24 hour  Intake 4999.61 ml  Output --  Net 4999.61 ml   Filed Weights   05/02/22 1730  Weight: 93 kg    Physical Examination: Body mass index is 30.27 kg/m.   General: Obese built, not in obvious distress on nasal oxygen HENT:   No scleral pallor or icterus noted. Oral mucosa is moist.  Chest:    Diminished breath sounds bilaterally.  Mild expiratory wheezes noted. CVS: S1 &S2 heard. No murmur.  Regular rate and rhythm. Abdomen: Soft, nontender, nondistended.  Bowel sounds are heard.   Extremities: No cyanosis, clubbing or edema.  Peripheral pulses are palpable. Psych: Alert, awake and oriented, normal mood CNS:  No cranial nerve deficits.  Power equal in all extremities.   Skin: Warm and dry.  No rashes noted.  Data Reviewed:   CBC: Recent Labs  Lab 05/02/22 1753 05/02/22 1757 05/03/22 0552 05/04/22 0143  WBC 13.2*  --  9.2 20.0*  NEUTROABS 7.7  --  8.4*  --   HGB 13.0 14.3 12.3 10.6*  HCT 41.3 42.0 40.8 33.1*  MCV 85.3  --  87.4 85.5  PLT 336  --  332 235    Basic Metabolic Panel: Recent Labs  Lab 05/02/22 1753 05/02/22 1757 05/03/22 0552 05/04/22 0143  NA 140 141 140 138  K 3.8 3.9 4.3 4.1  CL 110  --  110 110  CO2 26  --  22 25  GLUCOSE 112*  --  192* 129*  BUN 7  --  8 13  CREATININE 0.84  --  1.07* 0.94  CALCIUM 9.5  --  9.5 9.2  MG  --   --  2.1 1.7    Liver Function Tests: Recent Labs  Lab 05/02/22 1753 05/03/22 0552  AST 21 23  ALT 22 22  ALKPHOS 84 87  BILITOT 0.9 0.4  PROT 7.4 7.5  ALBUMIN 3.6 3.6     Radiology Studies: CT Chest W Contrast  Result Date: 05/02/2022 CLINICAL DATA:  Productive cough and shortness of breath. EXAM: CT CHEST WITH CONTRAST TECHNIQUE: Multidetector CT imaging of the chest was performed during intravenous contrast administration. RADIATION DOSE REDUCTION: This exam was performed according to the departmental dose-optimization program which includes automated exposure control, adjustment of  the mA and/or kV according to patient size and/or use of iterative reconstruction technique. CONTRAST:  50m OMNIPAQUE IOHEXOL 300 MG/ML  SOLN COMPARISON:  November 15, 2019 FINDINGS: Cardiovascular: There is mild calcification of the aortic arch, without evidence of aortic aneurysm. Normal heart size. No pericardial effusion. Mediastinum/Nodes: No enlarged mediastinal, hilar, or axillary lymph nodes. Surgical clips are seen within the right axilla. Thyroid gland, trachea, and esophagus demonstrate no significant findings. Lungs/Pleura: Moderate severity emphysematous lung disease is seen involving predominantly the bilateral upper lobes. Stable 4 mm noncalcified lung nodules are seen within the anterior aspect of the right lower lobe (axial CT image 87, CT series 5) and posterior aspect of the left lower lobe (axial CT images 87, 127 and 137 CT series 5). A stable 4 mm noncalcified lung nodule is seen within the lateral aspect of the right lung base (axial  CT image 134, CT series 5). There is mild, stable lingular scarring and/or atelectasis. There is no evidence of acute infiltrate, pleural effusion or pneumothorax. Upper Abdomen: No acute abnormality. Musculoskeletal: A right breast prosthesis is seen. The left breast prosthesis seen on the prior study has been removed. A residual 4.7 cm x 1.7 cm area of fluid attenuation (approximately 1.76 Hounsfield units), with thin surrounding mildly hyperdense rim, is seen within the lower inner quadrant of the left breast, along the ribcage (axial CT image 63, CT series 3). Mild, chronic loss of vertebral body height is seen within the midthoracic spine. IMPRESSION: 1. Moderate severity emphysematous lung disease. 2. Multiple stable, likely benign bilateral 4 mm noncalcified lung nodules. 3. Interval removal of the left breast prosthesis seen on the prior study. 4. 4.7 cm x 1.7 cm area of fluid attenuation within the lower inner quadrant of the left breast, along the  ribcage. While this may represent a post procedural seroma related to breast prosthesis removal, correlation with MRI is recommended. Aortic Atherosclerosis (ICD10-I70.0) and Emphysema (ICD10-J43.9). Electronically Signed   By: Virgina Norfolk M.D.   On: 05/02/2022 20:22   DG Chest Port 1 View  Result Date: 05/02/2022 CLINICAL DATA:  Shortness of breath EXAM: PORTABLE CHEST 1 VIEW COMPARISON:  01/22/2020 FINDINGS: Hyperinflation with emphysema. No acute airspace disease, pleural effusion or pneumothorax. Stable cardiomediastinal silhouette with aortic atherosclerosis. No pneumothorax. Postsurgical changes of the right breast and axilla. IMPRESSION: Hyperinflation with emphysema.  No acute airspace disease. Electronically Signed   By: Donavan Foil M.D.   On: 05/02/2022 18:39      LOS: 1 day    Flora Lipps, MD [facility] Available via Epic secure chat 7am-7pm After these hours, please refer to coverage provider listed on [email or web address] 05/04/2022, 3:09 PM

## 2022-05-05 DIAGNOSIS — J441 Chronic obstructive pulmonary disease with (acute) exacerbation: Secondary | ICD-10-CM | POA: Diagnosis not present

## 2022-05-05 LAB — BASIC METABOLIC PANEL
Anion gap: 8 (ref 5–15)
BUN: 14 mg/dL (ref 6–20)
CO2: 32 mmol/L (ref 22–32)
Calcium: 9.4 mg/dL (ref 8.9–10.3)
Chloride: 100 mmol/L (ref 98–111)
Creatinine, Ser: 0.77 mg/dL (ref 0.44–1.00)
GFR, Estimated: >60 mL/min (ref >60)
Glucose, Bld: 137 mg/dL — ABNORMAL HIGH (ref 70–99)
Potassium: 4.6 mmol/L (ref 3.5–5.1)
Sodium: 140 mmol/L (ref 135–145)

## 2022-05-05 LAB — CBC
HCT: 35.4 % — ABNORMAL LOW (ref 36.0–46.0)
Hemoglobin: 11 g/dL — ABNORMAL LOW (ref 12.0–15.0)
MCH: 26.8 pg (ref 26.0–34.0)
MCHC: 31.1 g/dL (ref 30.0–36.0)
MCV: 86.1 fL (ref 80.0–100.0)
Platelets: 298 10*3/uL (ref 150–400)
RBC: 4.11 MIL/uL (ref 3.87–5.11)
RDW: 16.6 % — ABNORMAL HIGH (ref 11.5–15.5)
WBC: 15.5 10*3/uL — ABNORMAL HIGH (ref 4.0–10.5)
nRBC: 0 % (ref 0.0–0.2)

## 2022-05-05 MED ORDER — DOXYCYCLINE HYCLATE 100 MG PO TABS
100.0000 mg | ORAL_TABLET | Freq: Two times a day (BID) | ORAL | Status: DC
  Administered 2022-05-05: 100 mg via ORAL
  Filled 2022-05-05: qty 1

## 2022-05-05 MED ORDER — PREDNISONE 10 MG PO TABS: 10.0000 mg | ORAL_TABLET | Freq: Every day | ORAL | 0 refills | Status: DC

## 2022-05-05 MED ORDER — DOXYCYCLINE HYCLATE 100 MG PO TABS: 100.0000 mg | ORAL_TABLET | Freq: Two times a day (BID) | ORAL | 0 refills | Status: AC

## 2022-05-05 MED ORDER — GUAIFENESIN-DM 100-10 MG/5ML PO SYRP: 10.0000 mL | ORAL_SOLUTION | ORAL | 2 refills | Status: DC | PRN

## 2022-05-05 NOTE — Discharge Summary (Signed)
Physician Discharge Summary  Courtney Terry BXI:[id number] DOB: 1961-10-06 DOA: 05/02/2022  PCP: [facility]  Admit date: 05/02/2022 Discharge date: 05/05/2022  Admitted From: Home Disposition:  Home  Discharge Condition:Stable CODE STATUS:FULL Diet recommendation: Heart Healthy   Brief/Interim Summary:  61 year old female with past medical history of right breast cancer (Dx 2013 with recurrence, now S/P right mastectomy, follows with Dr. Burr Medico), chronic hypoxic respiratory failure  (on 2lpm via [place] at home), COPD, diastolic congestive heart failure (Echo 11/2017 EF 65-70% with G1DD),  cocaine abuse (currently abstinent), hypertension, hypothyroidism, OSA who presented to the hospital with complaints of shortness of breath.  Patient stated that she had been having shortness of breath and dyspnea for the last 2 months worse with exertion.  She had presented to multiple outpatient providers in the meantime and had multiple courses of steroids and antibiotic without improvement.  Patient then decided to come to hospital and was given magnesium and bronchodilator therapy and was admitted hospital for further evaluation and treatment .  Patient was admitted for COPD exacerbation.  Treated with IV steroids.  Today she is free of wheezing and adamant about going home.  She has been discharged with tapering oral prednisone therapy.  I have recommended her to follow-up with the pulmonologist in 1 to 2 weeks.  Following problems were addressed during her hospitalization:  COPD with acute exacerbation Presented with worsening shortness of breath and wheezing for prolonged duration  Chest x-ray with no evidence of pneumonia. Started on bronchodilators, Solu-Medrol,empiric doxycycline for bronchitis.  Free of wheezing today.  Very adamant about going home.   She is mildly tachycardic but heart rate below 100 at rest. She has inhalers at home.  She will be discharged with oral tapering prednisone.   She will continue cough medications. I have recommended her to follow-up with her PCP within a week.  Follow-up with her pulmonologist in 1 to 2 weeks.  Chronic respiratory failure with hypoxia On 2 L of oxygen at home.  Currently at baseline   Chronic diastolic CHF   Continue losartan from home.  Currently appears euvolemic.  Essential hypertension Continue amlodipine and losartan from home.   Hypothyroidism Continue Synthroid   Malignant neoplasm of upper-outer quadrant of female breast Status post lumpectomy with recurrence -now status post mastectomy.  Patient follows up with Dr Burr Medico in the outpatient setting.       Discharge Diagnoses:  Principal Problem:   COPD with acute exacerbation ([place]) Active Problems:   Chronic respiratory failure with hypoxia (HCC)   Chronic diastolic CHF (congestive heart failure) (HCC)   Essential hypertension   Hypothyroidism   Malignant neoplasm of upper-outer quadrant of female breast ([place])   Acute exacerbation of chronic obstructive pulmonary disease (COPD) ([place])    Discharge Instructions  Discharge Instructions     Diet - low sodium heart healthy   Complete by: As directed    Discharge instructions   Complete by: As directed    1) Please take prescribed medications as instructed 2)Follow up with your PCP in a week 3)Follow up with your pulmonologist in 1 to 2 weeks.   Increase activity slowly   Complete by: As directed       Allergies as of 05/05/2022       Reactions   Levaquin [levofloxacin] Hives        Medication List     STOP taking these medications    azithromycin 250 MG tablet Commonly known as: Zithromax Z-Pak  TAKE these medications    albuterol (2.5 MG/3ML) 0.083% nebulizer solution Commonly known as: PROVENTIL Take 3 mLs (2.5 mg total) by nebulization every 6 (six) hours as needed for wheezing or shortness of breath. What changed: Another medication with the same name was changed. Make sure  you understand how and when to take each.   albuterol 108 (90 Base) MCG/ACT inhaler Commonly known as: Ventolin HFA TAKE 2 PUFFS BY MOUTH EVERY 6 HOURS AS NEEDED FOR WHEEZE OR SHORTNESS OF BREATH What changed:  how much to take how to take this when to take this reasons to take this additional instructions   amLODipine 5 MG tablet Commonly known as: NORVASC Take 5 mg by mouth daily.   atorvastatin 40 MG tablet Commonly known as: LIPITOR Take 40 mg by mouth daily.   CALCIUM PO Take 1 tablet by mouth daily.   doxycycline 100 MG tablet Commonly known as: VIBRA-TABS Take 1 tablet (100 mg total) by mouth every 12 (twelve) hours for 3 days.   famotidine 40 MG tablet Commonly known as: PEPCID Take 40 mg by mouth every evening.   guaiFENesin-dextromethorphan 100-10 MG/5ML syrup Commonly known as: ROBITUSSIN DM Take 10 mLs by mouth every 4 (four) hours as needed for cough.   levothyroxine 75 MCG tablet Commonly known as: SYNTHROID Take 1 tablet (75 mcg total) by mouth daily.   losartan 25 MG tablet Commonly known as: COZAAR Take 25 mg by mouth daily.   naloxone 4 MG/0.1ML Liqd nasal spray kit Commonly known as: NARCAN Place 0.4 mg into the nose as needed (accidental overdose).   Oxycodone HCl 10 MG Tabs Take 10 mg by mouth 4 (four) times daily as needed for pain.   predniSONE 10 MG tablet Commonly known as: DELTASONE Take 1 tablet (10 mg total) by mouth daily. Take 4 pills daily for 3 days then 3 pills daily for 3 days then 2 pills daily for 3 days then 1 pil daily for 3 days then stop What changed:  medication strength how much to take how to take this when to take this additional instructions Another medication with the same name was removed. Continue taking this medication, and follow the directions you see here.   tamoxifen 20 MG tablet Commonly known as: NOLVADEX Take 1 tablet (20 mg total) by mouth daily.   tiZANidine 4 MG tablet Commonly known as:  ZANAFLEX Take 4 mg by mouth 3 (three) times daily as needed for muscle spasms.   Trelegy Ellipta 200-62.5-25 MCG/ACT Aepb Generic drug: Fluticasone-Umeclidin-Vilant Inhale 1 puff into the lungs daily.   Vitamin D (Ergocalciferol) 1.25 MG (50000 UNIT) Caps capsule Commonly known as: DRISDOL Take 50,000 Units by mouth every Monday.        Follow-up [place]. Schedule an appointment as soon as possible for a visit in 1 week(s).   Contact information: [place] [address] [phone]                Allergies  Allergen Reactions   Levaquin [Levofloxacin] Hives    Consultations: None   Procedures/Studies: CT Chest W Contrast  Result Date: 05/02/2022 CLINICAL DATA:  Productive cough and shortness of breath. EXAM: CT CHEST WITH CONTRAST TECHNIQUE: Multidetector CT imaging of the chest was performed during intravenous contrast administration. RADIATION DOSE REDUCTION: This exam was performed according to the departmental dose-optimization program which includes automated exposure control, adjustment of the mA and/or kV according to patient size and/or use  of iterative reconstruction technique. CONTRAST:  35mL OMNIPAQUE IOHEXOL 300 MG/ML  SOLN COMPARISON:  November 15, 2019 FINDINGS: Cardiovascular: There is mild calcification of the aortic arch, without evidence of aortic aneurysm. Normal heart size. No pericardial effusion. Mediastinum/Nodes: No enlarged mediastinal, hilar, or axillary lymph nodes. Surgical clips are seen within the right axilla. Thyroid gland, trachea, and esophagus demonstrate no significant findings. Lungs/Pleura: Moderate severity emphysematous lung disease is seen involving predominantly the bilateral upper lobes. Stable 4 mm noncalcified lung nodules are seen within the anterior aspect of the right lower lobe (axial CT image 87, CT series 5) and posterior aspect of the left lower lobe (axial CT images 87, 127 and  137 CT series 5). A stable 4 mm noncalcified lung nodule is seen within the lateral aspect of the right lung base (axial CT image 134, CT series 5). There is mild, stable lingular scarring and/or atelectasis. There is no evidence of acute infiltrate, pleural effusion or pneumothorax. Upper Abdomen: No acute abnormality. Musculoskeletal: A right breast prosthesis is seen. The left breast prosthesis seen on the prior study has been removed. A residual 4.7 cm x 1.7 cm area of fluid attenuation (approximately 1.76 Hounsfield units), with thin surrounding mildly hyperdense rim, is seen within the lower inner quadrant of the left breast, along the ribcage (axial CT image 63, CT series 3). Mild, chronic loss of vertebral body height is seen within the midthoracic spine. IMPRESSION: 1. Moderate severity emphysematous lung disease. 2. Multiple stable, likely benign bilateral 4 mm noncalcified lung nodules. 3. Interval removal of the left breast prosthesis seen on the prior study. 4. 4.7 cm x 1.7 cm area of fluid attenuation within the lower inner quadrant of the left breast, along the ribcage. While this may represent a post procedural seroma related to breast prosthesis removal, correlation with MRI is recommended. Aortic Atherosclerosis (ICD10-I70.0) and Emphysema (ICD10-J43.9). Electronically Signed   By: Virgina Norfolk M.D.   On: 05/02/2022 20:22   DG Chest Port 1 View  Result Date: 05/02/2022 CLINICAL DATA:  Shortness of breath EXAM: PORTABLE CHEST 1 VIEW COMPARISON:  01/22/2020 FINDINGS: Hyperinflation with emphysema. No acute airspace disease, pleural effusion or pneumothorax. Stable cardiomediastinal silhouette with aortic atherosclerosis. No pneumothorax. Postsurgical changes of the right breast and axilla. IMPRESSION: Hyperinflation with emphysema.  No acute airspace disease. Electronically Signed   By: Donavan Foil M.D.   On: 05/02/2022 18:39      Subjective: Patient seen and examined the bedside this  morning.  Hemodynamically stable.  On 2 L of oxygen per minute.  Has some cough.  Very adamant about going home.  Discharge Exam: Vitals:   05/05/22 0747 05/05/22 0801  BP:  110/66  Pulse: 87 89  Resp: 18 18  Temp:  98.7 F (37.1 C)  SpO2: 100% 98%   Vitals:   05/05/22 0010 05/05/22 0448 05/05/22 0747 05/05/22 0801  BP: 93/62 (!) 142/81  110/66  Pulse: 82 66 87 89  Resp: $Remo'18 18 18 18  'QHkFt$ Temp: 99.1 F (37.3 C) 98.4 F (36.9 C)  98.7 F (37.1 C)  TempSrc: Oral Oral  Oral  SpO2: 100%  100% 98%  Weight:      Height:        General: Pt is alert, awake, not in acute distress Cardiovascular: RRR, S1/S2 +, no rubs, no gallops Respiratory: CTA bilaterally, no wheezing, no rhonchi, diminished air sound bilaterally Abdominal: Soft, NT, ND, bowel sounds + Extremities: no edema, no cyanosis    The results  of significant diagnostics from this hospitalization (including imaging, microbiology, ancillary and laboratory) are listed below for reference.     Microbiology: Recent Results (from the past 240 hour(s))  SARS Coronavirus 2 by RT PCR (hospital order, performed in [facility] hospital lab) *cepheid single result test* Anterior Nasal Swab     Status: None   Collection Time: 05/02/22  6:56 PM   Specimen: Anterior Nasal Swab  Result Value Ref Range Status   SARS Coronavirus 2 by RT PCR NEGATIVE NEGATIVE Final    Comment: (NOTE) SARS-CoV-2 target nucleic acids are NOT DETECTED.  The SARS-CoV-2 RNA is generally detectable in upper and lower respiratory specimens during the acute phase of infection. The lowest concentration of SARS-CoV-2 viral copies this assay can detect is 250 copies / mL. A negative result does not preclude SARS-CoV-2 infection and should not be used as the sole basis for treatment or other patient management decisions.  A negative result may occur with improper specimen collection / handling, submission of specimen other than nasopharyngeal swab, presence of  viral mutation(s) within the areas targeted by this assay, and inadequate number of viral copies (<250 copies / mL). A negative result must be combined with clinical observations, patient history, and epidemiological information.  Fact Sheet for Patients:   https://www.patel.info/  Fact Sheet for Healthcare Providers: https://hall.com/  This test is not yet approved or  cleared by the [place] FDA and has been authorized for detection and/or diagnosis of SARS-CoV-2 by FDA under an Emergency Use Authorization (EUA).  This EUA will remain in effect (meaning this test can be used) for the duration of the COVID-19 declaration under Section 564(b)(1) of the Act, 21 U.S.C. section 360bbb-3(b)(1), unless the authorization is terminated or revoked sooner.  Performed at [facility], [employer] [address] [place], [place] [address]      Labs: BNP (last 3 results) No results for input(s): "BNP" in the last 8760 hours. Basic Metabolic Panel: Recent Labs  Lab 05/02/22 1753 05/02/22 1757 05/03/22 0552 05/04/22 0143 05/05/22 0257  NA 140 141 140 138 140  K 3.8 3.9 4.3 4.1 4.6  CL 110  --  110 110 100  CO2 26  --  22 25 32  GLUCOSE 112*  --  192* 129* 137*  BUN 7  --  $R'8 13 14  'NL$ CREATININE 0.84  --  1.07* 0.94 0.77  CALCIUM 9.5  --  9.5 9.2 9.4  MG  --   --  2.1 1.7  --    Liver Function Tests: Recent Labs  Lab 05/02/22 1753 05/03/22 0552  AST 21 23  ALT 22 22  ALKPHOS 84 87  BILITOT 0.9 0.4  PROT 7.4 7.5  ALBUMIN 3.6 3.6   No results for input(s): "LIPASE", "AMYLASE" in the last 168 hours. No results for input(s): "AMMONIA" in the last 168 hours. CBC: Recent Labs  Lab 05/02/22 1753 05/02/22 1757 05/03/22 0552 05/04/22 0143 05/05/22 0257  WBC 13.2*  --  9.2 20.0* 15.5*  NEUTROABS 7.7  --  8.4*  --   --   HGB 13.0 14.3 12.3 10.6* 11.0*  HCT 41.3 42.0 40.8 33.1* 35.4*  MCV 85.3  --  87.4 85.5 86.1  PLT 336  --  332  296 298   Cardiac Enzymes: No results for input(s): "CKTOTAL", "CKMB", "CKMBINDEX", "TROPONINI" in the last 168 hours. BNP: Invalid input(s): "POCBNP" CBG: Recent Labs  Lab 05/03/22 0751 05/03/22 1132  GLUCAP 187* 164*   D-Dimer Recent  Labs    05/02/22 1753  DDIMER 0.39   Hgb A1c No results for input(s): "HGBA1C" in the last 72 hours. Lipid Profile No results for input(s): "CHOL", "HDL", "LDLCALC", "TRIG", "CHOLHDL", "LDLDIRECT" in the last 72 hours. Thyroid function studies No results for input(s): "TSH", "T4TOTAL", "T3FREE", "THYROIDAB" in the last 72 hours.  Invalid input(s): "FREET3" Anemia work up No results for input(s): "VITAMINB12", "FOLATE", "FERRITIN", "TIBC", "IRON", "RETICCTPCT" in the last 72 hours. Urinalysis    Component Value Date/Time   COLORURINE YELLOW 11/15/2019 1153   APPEARANCEUR CLEAR 11/15/2019 1153   LABSPEC 1.018 11/15/2019 1153   PHURINE 7.0 11/15/2019 1153   GLUCOSEU NEGATIVE 11/15/2019 1153   HGBUR NEGATIVE 11/15/2019 1153   BILIRUBINUR NEGATIVE 11/15/2019 1153   KETONESUR NEGATIVE 11/15/2019 1153   PROTEINUR NEGATIVE 11/15/2019 1153   UROBILINOGEN 0.2 03/06/2014 1921   NITRITE NEGATIVE 11/15/2019 1153   LEUKOCYTESUR NEGATIVE 11/15/2019 1153   Sepsis Labs Recent Labs  Lab 05/02/22 1753 05/03/22 0552 05/04/22 0143 05/05/22 0257  WBC 13.2* 9.2 20.0* 15.5*   Microbiology Recent Results (from the past 240 hour(s))  SARS Coronavirus 2 by RT PCR (hospital order, performed in [place] hospital lab) *cepheid single result test* Anterior Nasal Swab     Status: None   Collection Time: 05/02/22  6:56 PM   Specimen: Anterior Nasal Swab  Result Value Ref Range Status   SARS Coronavirus 2 by RT PCR NEGATIVE NEGATIVE Final    Comment: (NOTE) SARS-CoV-2 target nucleic acids are NOT DETECTED.  The SARS-CoV-2 RNA is generally detectable in upper and lower respiratory specimens during the acute phase of infection. The lowest concentration  of SARS-CoV-2 viral copies this assay can detect is 250 copies / mL. A negative result does not preclude SARS-CoV-2 infection and should not be used as the sole basis for treatment or other patient management decisions.  A negative result may occur with improper specimen collection / handling, submission of specimen other than nasopharyngeal swab, presence of viral mutation(s) within the areas targeted by this assay, and inadequate number of viral copies (<250 copies / mL). A negative result must be combined with clinical observations, patient history, and epidemiological information.  Fact Sheet for Patients:   https://www.patel.info/  Fact Sheet for Healthcare Providers: https://hall.com/  This test is not yet approved or  cleared by the [place] FDA and has been authorized for detection and/or diagnosis of SARS-CoV-2 by FDA under an Emergency Use Authorization (EUA).  This EUA will remain in effect (meaning this test can be used) for the duration of the COVID-19 declaration under Section 564(b)(1) of the Act, 21 U.S.C. section 360bbb-3(b)(1), unless the authorization is terminated or revoked sooner.  Performed at [facility], [place] [address] [place], [place] [address]     Please note: You were cared for by a hospitalist during your hospital stay. Once you are discharged, your primary care physician will handle any further medical issues. Please note that NO REFILLS for any discharge medications will be authorized once you are discharged, as it is imperative that you return to your primary care physician (or establish a relationship with a primary care physician if you do not have one) for your post hospital discharge needs so that they can reassess your need for medications and monitor your lab values.    Time coordinating discharge: 40 minutes  SIGNED:   Shelly Coss, MD  [facility] 05/05/2022, 9:42 AM Pager  [phone]  If 7PM-7AM, please contact night-coverage [email or web address] Password  [email or web address]

## 2022-05-09 ENCOUNTER — Ambulatory Visit: Payer: Medicaid Other

## 2022-05-14 ENCOUNTER — Other Ambulatory Visit (HOSPITAL_COMMUNITY): Payer: Self-pay

## 2022-05-14 ENCOUNTER — Encounter: Payer: Self-pay | Admitting: Hematology

## 2022-05-14 MED ORDER — OXYCODONE HCL 10 MG PO TABS
10.0000 mg | ORAL_TABLET | Freq: Four times a day (QID) | ORAL | 0 refills | Status: DC | PRN
  Filled 2022-05-14: qty 120, 30d supply, fill #0

## 2022-05-14 MED ORDER — TIZANIDINE HCL 4 MG PO TABS
4.0000 mg | ORAL_TABLET | Freq: Three times a day (TID) | ORAL | 0 refills | Status: DC | PRN
  Filled 2022-05-14: qty 90, 30d supply, fill #0

## 2022-05-14 MED ORDER — ICOSAPENT ETHYL 1 G PO CAPS
2.0000 g | ORAL_CAPSULE | Freq: Two times a day (BID) | ORAL | 4 refills | Status: DC
  Filled 2022-05-14: qty 120, 30d supply, fill #0

## 2022-05-22 ENCOUNTER — Ambulatory Visit: Payer: Medicaid Other

## 2022-06-03 ENCOUNTER — Telehealth: Payer: Self-pay | Admitting: Pulmonary Disease

## 2022-06-03 ENCOUNTER — Other Ambulatory Visit: Payer: Self-pay | Admitting: Pulmonary Disease

## 2022-06-04 ENCOUNTER — Ambulatory Visit: Payer: Medicaid Other

## 2022-06-04 MED ORDER — TRELEGY ELLIPTA 200-62.5-25 MCG/ACT IN AEPB: 1.0000 | INHALATION_SPRAY | Freq: Every day | RESPIRATORY_TRACT | 5 refills | Status: DC

## 2022-06-04 NOTE — Telephone Encounter (Signed)
Medication refilled. Nothing further needed  

## 2022-06-07 DIAGNOSIS — J9610 Chronic respiratory failure, unspecified whether with hypoxia or hypercapnia: Secondary | ICD-10-CM | POA: Diagnosis not present

## 2022-06-07 DIAGNOSIS — J449 Chronic obstructive pulmonary disease, unspecified: Secondary | ICD-10-CM | POA: Diagnosis not present

## 2022-06-19 ENCOUNTER — Telehealth: Payer: Self-pay | Admitting: Pulmonary Disease

## 2022-06-19 NOTE — Telephone Encounter (Signed)
Dr. Elsworth Soho reviewed the CAP form and determined it needs to be completed by the patient's PCP.  I called the patient and let her know and she said she will let [facility] (her home nurse company) know.  I have also faxed the form to [facility] with an explanation of why Dr. Elsworth Soho will not fill it out.  Fax# [phone]

## 2022-06-19 NOTE — Telephone Encounter (Signed)
Received a fax from [facility] [employer] Social [employer] with application for patient to qualify for Community Alternatives Program (CAP) through [place].  Patient last seen by Dr. Elsworth Soho on 02/10/22.  I completed the demographics part of the form and emailed it to the [place] office for Dr. Elsworth Soho to complete on 9/18.  He will not be back in the [place] office until 9/27.

## 2022-06-26 ENCOUNTER — Ambulatory Visit: Payer: Medicaid Other

## 2022-07-01 ENCOUNTER — Other Ambulatory Visit: Payer: Self-pay

## 2022-07-01 DIAGNOSIS — Z1231 Encounter for screening mammogram for malignant neoplasm of breast: Secondary | ICD-10-CM

## 2022-07-07 DIAGNOSIS — J449 Chronic obstructive pulmonary disease, unspecified: Secondary | ICD-10-CM | POA: Diagnosis not present

## 2022-07-07 DIAGNOSIS — J9610 Chronic respiratory failure, unspecified whether with hypoxia or hypercapnia: Secondary | ICD-10-CM | POA: Diagnosis not present

## 2022-07-16 ENCOUNTER — Telehealth: Payer: Self-pay | Admitting: Pulmonary Disease

## 2022-07-17 MED ORDER — PREDNISONE 10 MG PO TABS: 10.0000 mg | ORAL_TABLET | Freq: Every day | ORAL | 1 refills | Status: DC

## 2022-07-17 NOTE — Telephone Encounter (Signed)
Called and spoke with patient. She is aware that the RX will be sent to pharmacy.   Nothing further needed at time of call.

## 2022-07-17 NOTE — Telephone Encounter (Signed)
Called and spoke with patient. Patient stated that she is out of prednisone and that she needs a refill.   RA, please advise.

## 2022-07-23 ENCOUNTER — Ambulatory Visit: Payer: Medicaid Other

## 2022-08-06 ENCOUNTER — Other Ambulatory Visit: Payer: Self-pay | Admitting: Pulmonary Disease

## 2022-08-08 ENCOUNTER — Encounter: Payer: Self-pay | Admitting: Hematology

## 2022-08-08 ENCOUNTER — Other Ambulatory Visit: Payer: Self-pay

## 2022-08-08 ENCOUNTER — Telehealth: Payer: Self-pay | Admitting: *Deleted

## 2022-08-08 ENCOUNTER — Other Ambulatory Visit: Payer: Self-pay | Admitting: *Deleted

## 2022-08-08 ENCOUNTER — Other Ambulatory Visit (HOSPITAL_COMMUNITY): Payer: Self-pay

## 2022-08-08 MED ORDER — TRELEGY ELLIPTA 200-62.5-25 MCG/ACT IN AEPB: 1.0000 | INHALATION_SPRAY | Freq: Every day | RESPIRATORY_TRACT | 0 refills | Status: DC

## 2022-08-08 MED ORDER — TRELEGY ELLIPTA 200-62.5-25 MCG/ACT IN AEPB: 28.0000 | INHALATION_SPRAY | Freq: Two times a day (BID) | RESPIRATORY_TRACT | 0 refills | Status: DC

## 2022-08-08 MED ORDER — TRELEGY ELLIPTA 200-62.5-25 MCG/ACT IN AEPB: 28.0000 | INHALATION_SPRAY | Freq: Every day | RESPIRATORY_TRACT | 0 refills | Status: DC

## 2022-08-08 NOTE — Telephone Encounter (Signed)
Patient would like samples of Trelegy. Please call [phone] and advise

## 2022-08-08 NOTE — Telephone Encounter (Signed)
Per benefits investigation, patient has other coverage through "[employer]" per [employer]. They only insurance [place] we [place] Would need updated insurance information to proceed.

## 2022-08-08 NOTE — Telephone Encounter (Signed)
Called and spoke with patient, she is requesting a sample of Trelegy 200 to get her through until they straighten out the issue with her Medicaid.  Advised that I would put a sample up front for her to pick up.  She verbalized understanding.  Nothing further needed.

## 2022-08-08 NOTE — Telephone Encounter (Signed)
Please ask pharmacy to look into coverage and see what is alternative for triple therapy for this patient

## 2022-08-12 MED ORDER — TRELEGY ELLIPTA 200-62.5-25 MCG/ACT IN AEPB: INHALATION_SPRAY | RESPIRATORY_TRACT | 5 refills | Status: DC

## 2022-08-12 NOTE — Addendum Note (Signed)
Addended by: Loma Sousa on: 08/12/2022 04:46 PM   Modules accepted: Orders

## 2022-09-02 ENCOUNTER — Telehealth: Payer: Self-pay | Admitting: Pulmonary Disease

## 2022-09-02 ENCOUNTER — Other Ambulatory Visit (HOSPITAL_COMMUNITY): Payer: Self-pay

## 2022-09-02 MED ORDER — TRELEGY ELLIPTA 200-62.5-25 MCG/ACT IN AEPB
1.0000 | INHALATION_SPRAY | Freq: Every day | RESPIRATORY_TRACT | 0 refills | Status: DC
Start: 2022-09-02 — End: 2022-09-25

## 2022-09-02 NOTE — Telephone Encounter (Signed)
Called [facility] and spoke with [place]. She confirmed that the patient needs a PA for her Trelegy through Medicaid.   Called and spoke with patient. She is aware that we will work on the PA and that I have left a sample at the front desk. She will come by the office today.   PA Team, can we start a PA on her Trelegy 200 please? Thanks!

## 2022-09-02 NOTE — Telephone Encounter (Signed)
  Per benefits investigation, patient has other coverage through "[employer]" per [employer]. They only insurance [place] we [place]. Would need updated insurance information to proceed.

## 2022-09-05 ENCOUNTER — Other Ambulatory Visit (HOSPITAL_COMMUNITY): Payer: Self-pay

## 2022-09-06 ENCOUNTER — Encounter: Payer: Self-pay | Admitting: Hematology

## 2022-09-06 ENCOUNTER — Other Ambulatory Visit (HOSPITAL_COMMUNITY): Payer: Self-pay

## 2022-09-06 MED ORDER — OXYCODONE-ACETAMINOPHEN 10-325 MG PO TABS
1.0000 | ORAL_TABLET | Freq: Four times a day (QID) | ORAL | 0 refills | Status: DC | PRN
  Filled 2022-09-06 – 2022-09-09 (×2): qty 120, 30d supply, fill #0

## 2022-09-06 MED ORDER — TIZANIDINE HCL 4 MG PO TABS
4.0000 mg | ORAL_TABLET | Freq: Three times a day (TID) | ORAL | 0 refills | Status: DC | PRN
  Filled 2022-09-06: qty 90, 30d supply, fill #0

## 2022-09-09 ENCOUNTER — Other Ambulatory Visit (HOSPITAL_COMMUNITY): Payer: Self-pay

## 2022-09-09 ENCOUNTER — Ambulatory Visit: Payer: Medicaid Other

## 2022-09-09 ENCOUNTER — Encounter: Payer: Self-pay | Admitting: Hematology

## 2022-09-10 NOTE — Telephone Encounter (Signed)
Patient states only has Medicaid insurance. Patient needs prior authorization for Trelegy inhaler. Pharmacy is [facility] [address] Patient phone number is [phone].

## 2022-09-11 NOTE — Telephone Encounter (Signed)
If patient only has Medicaid coverage they will need to contact Medicaid and have that "other insurance" block taken off of their file. Only the patient is able to authorize this change.

## 2022-09-11 NOTE — Telephone Encounter (Signed)
Patient called to stated that she talked to [facility] yesterday and the block was removed.  She would like the script for her [facility] inhaler [facility] [address] because she stated she does not have any more medication.  Please advise

## 2022-09-13 ENCOUNTER — Other Ambulatory Visit (HOSPITAL_COMMUNITY): Payer: Self-pay

## 2022-09-13 ENCOUNTER — Other Ambulatory Visit: Payer: Self-pay | Admitting: *Deleted

## 2022-09-13 ENCOUNTER — Ambulatory Visit: Payer: Medicaid Other

## 2022-09-13 ENCOUNTER — Telehealth: Payer: Self-pay | Admitting: *Deleted

## 2022-09-13 MED ORDER — TRELEGY ELLIPTA 200-62.5-25 MCG/ACT IN AEPB: 1.0000 | INHALATION_SPRAY | Freq: Every day | RESPIRATORY_TRACT | 0 refills | Status: DC

## 2022-09-13 NOTE — Telephone Encounter (Signed)
Patient checking on prior authorization for Trelegy inhaler. Also would like sample of Trelegy inhaler. Patient phone number is [phone].

## 2022-09-13 NOTE — Telephone Encounter (Signed)
Good afternoon, We received a call from the patient that the block has been removed.  [facility] called [facility] [address] and was told that when they try to run the trelegy they still get a prior authorization required message.  Please advise on status of prior auth since block has been removed.  Thank you.

## 2022-09-13 NOTE — Telephone Encounter (Signed)
Rigoberto Noel, MD  [facility] Triage Pool6 minutes ago (4:39 PM)    Please refer to office visit 11/2019 for inability to tolerate Advair and Spiriva. Due to back pain. She has been on Trelegy since 2021. Trelegy 100 should suffice    Routing to prior auth team.

## 2022-09-13 NOTE — Telephone Encounter (Signed)
Per benefits investigation, preferred alternatives are BRAND Spiriva Handihaler and BRAND Advair Diskus. No notation of trial/failure of these alternatives. Please advise if med change is appropriate.

## 2022-09-13 NOTE — Telephone Encounter (Signed)
Dr. Elsworth Soho, the patient is currently using Trelegy 200, however, our pharmacy team states the following:  Per benefits investigation, preferred alternatives are BRAND Spiriva Handihaler and BRAND Advair Diskus. No notation of trial/failure of these alternatives. Please advise if med change is appropriate.

## 2022-09-13 NOTE — Telephone Encounter (Signed)
Called and spoke with patient, advised her that the Trelegy is still requiring a PA and that I sent a message to our PA pharmacy team.  I also let her know that I put some Trelegy samples up front for her to pickup.  She states she will come by and pick them up as she is completely out.  Nothing further needed.

## 2022-09-16 ENCOUNTER — Encounter: Payer: Self-pay | Admitting: Hematology

## 2022-09-16 ENCOUNTER — Telehealth: Payer: Self-pay

## 2022-09-16 ENCOUNTER — Other Ambulatory Visit (HOSPITAL_COMMUNITY): Payer: Self-pay

## 2022-09-16 NOTE — Telephone Encounter (Signed)
PA request received for Trelegy through [facility].  Patient has previously been on preferred Advair and Spiriva which caused patient lung and back pain per provider, this was noted in PA request.   PA has been submitted through [employer] and is awaiting determination.   Confirmation: [id number]

## 2022-09-16 NOTE — Telephone Encounter (Signed)
PA has been submitted for Trelegy. Awaiting determination. Will update in new encounter that has been created.

## 2022-09-17 NOTE — Telephone Encounter (Signed)
PA for Trelegy has been DENIED through [facility], no other information given at this time.

## 2022-09-24 ENCOUNTER — Telehealth: Payer: Self-pay | Admitting: Pulmonary Disease

## 2022-09-24 NOTE — Telephone Encounter (Signed)
PT states needs call in for alternative medicine to Trelogy. Was denied by Medicaid. Please advise PT after checking w/Dr.

## 2022-09-24 NOTE — Telephone Encounter (Signed)
Hey so I see the PA for Trelegy was denied. Does it say anything else of why it was denied  Please advise

## 2022-09-25 MED ORDER — UMECLIDINIUM-VILANTEROL 62.5-25 MCG/ACT IN AEPB: 1.0000 | INHALATION_SPRAY | Freq: Every day | RESPIRATORY_TRACT | 2 refills | Status: DC

## 2022-09-25 NOTE — Telephone Encounter (Signed)
Dr Elsworth Soho,  Patient is asking for an alternative to Trelegy. She states it was denied by Medicaid and needs something else  Please advise sir

## 2022-09-25 NOTE — Telephone Encounter (Signed)
There was no additional information given for the denial.

## 2022-09-25 NOTE — Telephone Encounter (Signed)
Called and spoke to patient and she agrees to the [facility] as long as it is covered. And she states if it is not covered she will call us and let us know. Nothing further needed

## 2022-10-03 NOTE — Telephone Encounter (Signed)
See encounter from 12/20. Will close this encounter.

## 2022-10-08 ENCOUNTER — Other Ambulatory Visit (HOSPITAL_COMMUNITY): Payer: Self-pay

## 2022-10-08 ENCOUNTER — Other Ambulatory Visit: Payer: Self-pay

## 2022-10-08 MED ORDER — VITAMIN D (ERGOCALCIFEROL) 1.25 MG (50000 UNIT) PO CAPS
50000.0000 [IU] | ORAL_CAPSULE | ORAL | 1 refills | Status: DC
  Filled 2022-10-08: qty 12, 84d supply, fill #0

## 2022-10-08 MED ORDER — OXYCODONE-ACETAMINOPHEN 10-325 MG PO TABS
1.0000 | ORAL_TABLET | Freq: Four times a day (QID) | ORAL | 0 refills | Status: DC | PRN
  Filled 2022-10-08: qty 120, 30d supply, fill #0

## 2022-10-08 MED ORDER — TIZANIDINE HCL 4 MG PO TABS
4.0000 mg | ORAL_TABLET | Freq: Three times a day (TID) | ORAL | 0 refills | Status: DC | PRN
  Filled 2022-10-08: qty 90, 30d supply, fill #0

## 2022-10-09 ENCOUNTER — Other Ambulatory Visit: Payer: Self-pay

## 2022-10-09 ENCOUNTER — Other Ambulatory Visit (HOSPITAL_COMMUNITY): Payer: Self-pay

## 2022-10-16 ENCOUNTER — Other Ambulatory Visit: Payer: Self-pay | Admitting: Hematology

## 2022-10-22 DIAGNOSIS — M810 Age-related osteoporosis without current pathological fracture: Secondary | ICD-10-CM | POA: Insufficient documentation

## 2022-10-22 NOTE — Assessment & Plan Note (Deleted)
Her baseline DEXA in 09/2017 showed osteoporosis with lowest T-score -3.9. Recent dexa scan on 04/02/21 showed lowest T score of -4.2.  -she began Zometa on 04/20/21. Plan for 2 years  -She has partial dentures on the bottom and full dentures on the top. She does not have any dental concerns.  -I switched her to tamoxifen in 10/2021

## 2022-10-22 NOTE — Assessment & Plan Note (Deleted)
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 She did not have any adjuvant therapy. -She had right breast recurrence in 12/2014, ER+/PR-/HER2-. S/p right mastectomy, path showing multifocal IDC and ILC, and bilateral breast reconstruction. -Oncotype RS was 30 (high risk). Due to her poor compliance and medical comorbilities, adjuvant chemo was not pursued  -She started anastrozole in 04/2015, but has been noncompliant with f/u and taking anastrozole consistently. She was reportedly compliant with anastrozole from 10/2020 to ~05/2021 when the prescription ran out.  --due to severe osteoporosis, I switched her to tamoxifen at her last visit on 10/22/21. She reports she is tolerating well with no side effects. Plan to continue through 2026.
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
## 2022-10-23 ENCOUNTER — Other Ambulatory Visit: Payer: Self-pay

## 2022-10-23 ENCOUNTER — Inpatient Hospital Stay: Payer: Medicaid Other | Admitting: Hematology

## 2022-10-23 ENCOUNTER — Inpatient Hospital Stay: Payer: Medicaid Other

## 2022-10-23 ENCOUNTER — Inpatient Hospital Stay: Payer: Medicaid Other | Attending: Hematology

## 2022-10-23 VITALS — BP 145/77 | HR 87 | Temp 98.4°F | Resp 22 | Wt 182.0 lb

## 2022-10-23 DIAGNOSIS — Z170 Estrogen receptor positive status [ER+]: Secondary | ICD-10-CM | POA: Insufficient documentation

## 2022-10-23 DIAGNOSIS — M810 Age-related osteoporosis without current pathological fracture: Secondary | ICD-10-CM | POA: Diagnosis present

## 2022-10-23 DIAGNOSIS — Z79810 Long term (current) use of selective estrogen receptor modulators (SERMs): Secondary | ICD-10-CM | POA: Insufficient documentation

## 2022-10-23 DIAGNOSIS — C50411 Malignant neoplasm of upper-outer quadrant of right female breast: Secondary | ICD-10-CM | POA: Diagnosis present

## 2022-10-23 DIAGNOSIS — M816 Localized osteoporosis [Lequesne]: Secondary | ICD-10-CM

## 2022-10-23 LAB — CMP (CANCER CENTER ONLY)
ALT: 7 U/L (ref 0–44)
AST: 17 U/L (ref 15–41)
Albumin: 3.9 g/dL (ref 3.5–5.0)
Alkaline Phosphatase: 71 U/L (ref 38–126)
Anion gap: 5 (ref 5–15)
BUN: 8 mg/dL (ref 6–20)
CO2: 28 mmol/L (ref 22–32)
Calcium: 9.3 mg/dL (ref 8.9–10.3)
Chloride: 104 mmol/L (ref 98–111)
Creatinine: 0.52 mg/dL (ref 0.44–1.00)
GFR, Estimated: >60 mL/min (ref >60)
Glucose, Bld: 79 mg/dL (ref 70–99)
Potassium: 4.6 mmol/L (ref 3.5–5.1)
Sodium: 137 mmol/L (ref 135–145)
Total Bilirubin: 0.4 mg/dL (ref 0.3–1.2)
Total Protein: 6.9 g/dL (ref 6.5–8.1)

## 2022-10-23 LAB — CBC WITH DIFFERENTIAL (CANCER CENTER ONLY)
Abs Immature Granulocytes: 0.03 10*3/uL (ref 0.00–0.07)
Basophils Absolute: 0.1 10*3/uL (ref 0.0–0.1)
Basophils Relative: 1 %
Eosinophils Absolute: 0.2 10*3/uL (ref 0.0–0.5)
Eosinophils Relative: 1 %
HCT: 43.5 % (ref 36.0–46.0)
Hemoglobin: 13.4 g/dL (ref 12.0–15.0)
Immature Granulocytes: 0 %
Lymphocytes Relative: 26 %
Lymphs Abs: 2.8 10*3/uL (ref 0.7–4.0)
MCH: 26.5 pg (ref 26.0–34.0)
MCHC: 30.8 g/dL (ref 30.0–36.0)
MCV: 86.1 fL (ref 80.0–100.0)
Monocytes Absolute: 0.8 10*3/uL (ref 0.1–1.0)
Monocytes Relative: 7 %
Neutro Abs: 7.2 10*3/uL (ref 1.7–7.7)
Neutrophils Relative %: 65 %
Platelet Count: 282 10*3/uL (ref 150–400)
RBC: 5.05 MIL/uL (ref 3.87–5.11)
RDW: 15.3 % (ref 11.5–15.5)
WBC Count: 11.1 10*3/uL — ABNORMAL HIGH (ref 4.0–10.5)
nRBC: 0 % (ref 0.0–0.2)

## 2022-10-23 MED ORDER — SODIUM CHLORIDE 0.9 % IV SOLN: Freq: Once | INTRAVENOUS | Status: AC

## 2022-10-23 MED ORDER — ZOLEDRONIC ACID 4 MG/100ML IV SOLN
4.0000 mg | Freq: Once | INTRAVENOUS | Status: AC
  Administered 2022-10-23: 4 mg via INTRAVENOUS
  Filled 2022-10-23: qty 100

## 2022-10-23 NOTE — Patient Instructions (Signed)
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
## 2022-11-01 ENCOUNTER — Inpatient Hospital Stay (HOSPITAL_BASED_OUTPATIENT_CLINIC_OR_DEPARTMENT_OTHER): Payer: Medicaid Other | Admitting: Hematology

## 2022-11-01 ENCOUNTER — Encounter: Payer: Self-pay | Admitting: Hematology

## 2022-11-01 DIAGNOSIS — J441 Chronic obstructive pulmonary disease with (acute) exacerbation: Secondary | ICD-10-CM | POA: Diagnosis not present

## 2022-11-01 DIAGNOSIS — M816 Localized osteoporosis [Lequesne]: Secondary | ICD-10-CM | POA: Diagnosis not present

## 2022-11-01 DIAGNOSIS — C50411 Malignant neoplasm of upper-outer quadrant of right female breast: Secondary | ICD-10-CM | POA: Diagnosis not present

## 2022-11-01 DIAGNOSIS — Z170 Estrogen receptor positive status [ER+]: Secondary | ICD-10-CM

## 2022-11-01 DIAGNOSIS — F209 Schizophrenia, unspecified: Secondary | ICD-10-CM

## 2022-11-01 MED ORDER — TAMOXIFEN CITRATE 20 MG PO TABS: 20.0000 mg | ORAL_TABLET | Freq: Every day | ORAL | 1 refills | Status: DC

## 2022-11-01 NOTE — Assessment & Plan Note (Signed)
Her baseline DEXA in 09/2017 showed osteoporosis with lowest T-score -3.9. Recent dexa scan on 04/02/21 showed lowest T score of -4.2.  -she began Zometa on 04/20/21 and has completed 4 treatments  -She has partial dentures on the bottom and full dentures on the top. She does not have any dental concerns.  -I switched her to tamoxifen in 10/2021, which will strength her bones

## 2022-11-01 NOTE — Assessment & Plan Note (Signed)
-  She will continue follow-up with her psychologist, and be monitored about her psych program.

## 2022-11-01 NOTE — Assessment & Plan Note (Addendum)
Recurrent right breast cancer, mpT1cN0M0, stage Ia, ER100+, PR-, HER2- -initially diagnosed in 12/2011, ER+/PR+/HER2-, s/p lumpectomy. She did not have any adjuvant therapy. -She had right breast recurrence in 12/2014, ER+/PR-/HER2-. S/p right mastectomy, path showing multifocal IDC and ILC, and bilateral breast reconstruction. -Oncotype RS was 30 (high risk). Due to her poor compliance and medical comorbilities, adjuvant chemo was not pursued  -She started anastrozole in 04/2015, but has been noncompliant with f/u and taking anastrozole consistently. She was reportedly compliant with anastrozole from 10/2020 to ~05/2021 when the prescription ran out.  -I switched her to tamoxifen in January 2023.  She is tolerating well.

## 2022-11-01 NOTE — Assessment & Plan Note (Signed)
-  She still has significant dyspnea even without exertion. -She is currently on 2L continuous supplemental oxygen, inhalers and prednisone currently. -She is to f/u with pulmonologist every 3 months, but she is noncompliant.

## 2022-11-04 ENCOUNTER — Telehealth: Payer: Self-pay | Admitting: Hematology

## 2022-11-04 NOTE — Telephone Encounter (Signed)
Left patient a vm regarding upcoming appointments

## 2022-11-07 ENCOUNTER — Other Ambulatory Visit: Payer: Self-pay

## 2022-11-07 ENCOUNTER — Other Ambulatory Visit (HOSPITAL_COMMUNITY): Payer: Self-pay

## 2022-11-07 ENCOUNTER — Ambulatory Visit: Payer: Medicaid Other

## 2022-11-07 MED ORDER — OXYCODONE-ACETAMINOPHEN 10-325 MG PO TABS
1.0000 | ORAL_TABLET | Freq: Four times a day (QID) | ORAL | 0 refills | Status: DC | PRN
  Filled 2022-11-07: qty 120, 30d supply, fill #0

## 2022-11-07 MED ORDER — TIZANIDINE HCL 4 MG PO TABS
4.0000 mg | ORAL_TABLET | Freq: Three times a day (TID) | ORAL | 0 refills | Status: DC | PRN
  Filled 2022-11-07: qty 90, 30d supply, fill #0

## 2022-11-09 ENCOUNTER — Other Ambulatory Visit: Payer: Self-pay | Admitting: Pulmonary Disease

## 2022-12-03 ENCOUNTER — Other Ambulatory Visit (HOSPITAL_COMMUNITY): Payer: Self-pay

## 2022-12-03 ENCOUNTER — Other Ambulatory Visit: Payer: Self-pay

## 2022-12-03 MED ORDER — OXYCODONE-ACETAMINOPHEN 10-325 MG PO TABS
1.0000 | ORAL_TABLET | Freq: Four times a day (QID) | ORAL | 0 refills | Status: DC | PRN
  Filled 2022-12-03: qty 120, 30d supply, fill #0

## 2022-12-31 ENCOUNTER — Other Ambulatory Visit: Payer: Self-pay

## 2022-12-31 ENCOUNTER — Other Ambulatory Visit (HOSPITAL_COMMUNITY): Payer: Self-pay

## 2023-01-01 ENCOUNTER — Other Ambulatory Visit (HOSPITAL_COMMUNITY): Payer: Self-pay

## 2023-01-01 MED ORDER — OXYCODONE-ACETAMINOPHEN 10-325 MG PO TABS
1.0000 | ORAL_TABLET | Freq: Four times a day (QID) | ORAL | 0 refills | Status: DC | PRN
  Filled 2023-01-02: qty 120, 30d supply, fill #0

## 2023-01-01 MED ORDER — TIZANIDINE HCL 4 MG PO TABS
4.0000 mg | ORAL_TABLET | Freq: Three times a day (TID) | ORAL | 0 refills | Status: DC
  Filled 2023-01-01: qty 90, 30d supply, fill #0

## 2023-01-02 ENCOUNTER — Other Ambulatory Visit: Payer: Self-pay

## 2023-01-02 ENCOUNTER — Other Ambulatory Visit (HOSPITAL_COMMUNITY): Payer: Self-pay

## 2023-01-09 ENCOUNTER — Other Ambulatory Visit: Payer: Self-pay

## 2023-01-11 ENCOUNTER — Other Ambulatory Visit: Payer: Self-pay | Admitting: Pulmonary Disease

## 2023-01-29 ENCOUNTER — Other Ambulatory Visit (HOSPITAL_COMMUNITY): Payer: Self-pay

## 2023-01-29 MED ORDER — OXYCODONE-ACETAMINOPHEN 10-325 MG PO TABS
1.0000 | ORAL_TABLET | Freq: Four times a day (QID) | ORAL | 0 refills | Status: DC | PRN
  Filled 2023-01-29 (×2): qty 120, 30d supply, fill #0

## 2023-01-29 MED ORDER — ERGOCALCIFEROL 1.25 MG (50000 UT) PO CAPS
50000.0000 [IU] | ORAL_CAPSULE | ORAL | 1 refills | Status: DC
  Filled 2023-01-29: qty 12, 84d supply, fill #0

## 2023-01-29 MED ORDER — TIZANIDINE HCL 4 MG PO TABS
4.0000 mg | ORAL_TABLET | Freq: Four times a day (QID) | ORAL | 0 refills | Status: DC | PRN
  Filled 2023-01-29: qty 120, 30d supply, fill #0

## 2023-02-09 ENCOUNTER — Other Ambulatory Visit: Payer: Self-pay | Admitting: Pulmonary Disease

## 2023-02-28 ENCOUNTER — Other Ambulatory Visit (HOSPITAL_COMMUNITY): Payer: Self-pay

## 2023-02-28 MED ORDER — OXYCODONE-ACETAMINOPHEN 10-325 MG PO TABS
1.0000 | ORAL_TABLET | Freq: Four times a day (QID) | ORAL | 0 refills | Status: DC | PRN
  Filled 2023-02-28: qty 120, 30d supply, fill #0

## 2023-02-28 MED ORDER — ERGOCALCIFEROL 1.25 MG (50000 UT) PO CAPS
1.0000 | ORAL_CAPSULE | ORAL | 1 refills | Status: DC
  Filled 2023-02-28: qty 12, 84d supply, fill #0

## 2023-02-28 MED ORDER — TIZANIDINE HCL 4 MG PO TABS
4.0000 mg | ORAL_TABLET | Freq: Four times a day (QID) | ORAL | 0 refills | Status: DC | PRN
  Filled 2023-02-28: qty 120, 30d supply, fill #0

## 2023-03-13 ENCOUNTER — Other Ambulatory Visit (HOSPITAL_COMMUNITY): Payer: Self-pay

## 2023-03-13 MED ORDER — ATORVASTATIN CALCIUM 40 MG PO TABS
40.0000 mg | ORAL_TABLET | Freq: Every day | ORAL | 0 refills | Status: AC
  Filled 2023-03-13: qty 90, 90d supply, fill #0

## 2023-03-14 ENCOUNTER — Other Ambulatory Visit (HOSPITAL_COMMUNITY): Payer: Self-pay

## 2023-03-22 ENCOUNTER — Other Ambulatory Visit: Payer: Self-pay | Admitting: Pulmonary Disease

## 2023-03-24 NOTE — Telephone Encounter (Signed)
Patient last seen on 02/16/2022.   Dr. Vassie Loll, please advise if you are ok with a one time refill? I attempted to call to get her scheduled for a F/U did she not answer. Thanks!

## 2023-03-26 ENCOUNTER — Telehealth: Payer: Self-pay | Admitting: Pulmonary Disease

## 2023-03-26 MED ORDER — ALBUTEROL SULFATE (2.5 MG/3ML) 0.083% IN NEBU: 2.5000 mg | INHALATION_SOLUTION | Freq: Four times a day (QID) | RESPIRATORY_TRACT | 0 refills | Status: DC | PRN

## 2023-03-26 MED ORDER — PREDNISONE 10 MG PO TABS: 10.0000 mg | ORAL_TABLET | Freq: Every day | ORAL | 0 refills | Status: DC

## 2023-03-26 MED ORDER — ALBUTEROL SULFATE (2.5 MG/3ML) 0.083% IN NEBU: 2.5000 mg | INHALATION_SOLUTION | Freq: Four times a day (QID) | RESPIRATORY_TRACT | 0 refills | Status: AC | PRN

## 2023-03-26 NOTE — Telephone Encounter (Signed)
Last seen 02/2022. Ok to refill 30 day supply as I know we are booked out but she needs a follow up scheduled since it has been over a year.

## 2023-03-26 NOTE — Telephone Encounter (Signed)
Spoke with the pt  She states needing refill on pred  She has been taking 10 mg daily since her last visit on 02/13/22- at that time directions were to take 5 mg MWF for May 2024  She however, never stopped taking 10 mg daily  I advised her that she needs appt  She refused, stating that it's too hot for her to go outdoors  She also refused video visit, bc she is unable to complete one  She states her symptoms are currently stable for her  Florentina Addison- please advise on pred refill as I she has been on this and does not need to run out with no plan and Dr Vassie Loll is unavailable until next wk,thanks!

## 2023-03-26 NOTE — Telephone Encounter (Signed)
Patient states needs refill for Prednisone and Nebulizer solution. Pharmacy is [facility]. Patient phone number is [phone].

## 2023-03-26 NOTE — Telephone Encounter (Signed)
I called and spoke with the pt  Scheduled for appt with APP- did not want to drive to [facility] office  I refilled pred # 30 no refills  Nothing further needed

## 2023-03-31 ENCOUNTER — Other Ambulatory Visit (HOSPITAL_COMMUNITY): Payer: Self-pay

## 2023-03-31 MED ORDER — OXYCODONE-ACETAMINOPHEN 10-325 MG PO TABS
1.0000 | ORAL_TABLET | Freq: Four times a day (QID) | ORAL | 0 refills | Status: AC
  Filled 2023-03-31: qty 120, 30d supply, fill #0

## 2023-03-31 MED ORDER — ERGOCALCIFEROL 1.25 MG (50000 UT) PO CAPS
50000.0000 [IU] | ORAL_CAPSULE | ORAL | 1 refills | Status: AC
  Filled 2023-03-31: qty 12, 84d supply, fill #0

## 2023-03-31 MED ORDER — TIZANIDINE HCL 4 MG PO TABS
4.0000 mg | ORAL_TABLET | Freq: Four times a day (QID) | ORAL | 0 refills | Status: AC
  Filled 2023-03-31: qty 120, 30d supply, fill #0

## 2023-03-31 NOTE — Telephone Encounter (Signed)
Needs FU OV with APP

## 2023-04-05 ENCOUNTER — Encounter (HOSPITAL_COMMUNITY): Payer: Self-pay

## 2023-04-05 ENCOUNTER — Ambulatory Visit (HOSPITAL_COMMUNITY)
Admission: EM | Admit: 2023-04-05 | Discharge: 2023-04-05 | Disposition: A | Discharge status: Home / Self Care | Payer: Medicaid Other | Attending: Emergency Medicine | Admitting: Emergency Medicine

## 2023-04-05 ENCOUNTER — Ambulatory Visit (INDEPENDENT_AMBULATORY_CARE_PROVIDER_SITE_OTHER): Payer: Medicaid Other

## 2023-04-05 DIAGNOSIS — S63502A Unspecified sprain of left wrist, initial encounter: Secondary | ICD-10-CM

## 2023-04-05 DIAGNOSIS — W19XXXA Unspecified fall, initial encounter: Secondary | ICD-10-CM

## 2023-04-05 NOTE — Discharge Instructions (Addendum)
Rest - try to avoid heavy lifting and high impact activity Ice - apply for 20 minutes a few times daily (not directly to skin!) Compression - use ace wrap as needed Elevation - prop up on a pillow  Please follow with orthopedics if symptoms persist

## 2023-04-05 NOTE — ED Triage Notes (Signed)
Patient here today after having a fall 4 days ago and injuring her left wrist. Patient was walking down her steps from her porch and slipped and fell back on her bottom. She reach down to try and catch herself.
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
## 2023-04-07 ENCOUNTER — Encounter: Payer: Self-pay | Admitting: Hematology

## 2023-04-14 ENCOUNTER — Ambulatory Visit (HOSPITAL_BASED_OUTPATIENT_CLINIC_OR_DEPARTMENT_OTHER): Payer: Medicaid Other | Admitting: Primary Care

## 2023-04-20 ENCOUNTER — Emergency Department (HOSPITAL_COMMUNITY)
Admission: EM | Admit: 2023-04-20 | Discharge: 2023-04-20 | Disposition: A | Discharge status: Home / Self Care | Payer: MEDICAID | Attending: Emergency Medicine | Admitting: Emergency Medicine

## 2023-04-20 ENCOUNTER — Encounter (HOSPITAL_COMMUNITY): Payer: Self-pay

## 2023-04-20 ENCOUNTER — Emergency Department (HOSPITAL_COMMUNITY): Payer: MEDICAID

## 2023-04-20 ENCOUNTER — Other Ambulatory Visit: Payer: Self-pay

## 2023-04-20 DIAGNOSIS — R000 Tachycardia, unspecified: Secondary | ICD-10-CM | POA: Diagnosis not present

## 2023-04-20 DIAGNOSIS — R0602 Shortness of breath: Secondary | ICD-10-CM | POA: Diagnosis present

## 2023-04-20 DIAGNOSIS — J441 Chronic obstructive pulmonary disease with (acute) exacerbation: Secondary | ICD-10-CM | POA: Insufficient documentation

## 2023-04-20 LAB — CBC WITH DIFFERENTIAL/PLATELET
Abs Immature Granulocytes: 0.06 10*3/uL (ref 0.00–0.07)
Basophils Absolute: 0.1 10*3/uL (ref 0.0–0.1)
Basophils Relative: 0 %
Eosinophils Absolute: 0.1 10*3/uL (ref 0.0–0.5)
Eosinophils Relative: 1 %
HCT: 43.2 % (ref 36.0–46.0)
Hemoglobin: 12.9 g/dL (ref 12.0–15.0)
Immature Granulocytes: 0 %
Lymphocytes Relative: 23 %
Lymphs Abs: 3.8 10*3/uL (ref 0.7–4.0)
MCH: 26.5 pg (ref 26.0–34.0)
MCHC: 29.9 g/dL — ABNORMAL LOW (ref 30.0–36.0)
MCV: 88.9 fL (ref 80.0–100.0)
Monocytes Absolute: 1.2 10*3/uL — ABNORMAL HIGH (ref 0.1–1.0)
Monocytes Relative: 8 %
Neutro Abs: 11 10*3/uL — ABNORMAL HIGH (ref 1.7–7.7)
Neutrophils Relative %: 68 %
Platelets: 308 10*3/uL (ref 150–400)
RBC: 4.86 MIL/uL (ref 3.87–5.11)
RDW: 14.7 % (ref 11.5–15.5)
WBC: 16.3 10*3/uL — ABNORMAL HIGH (ref 4.0–10.5)
nRBC: 0 % (ref 0.0–0.2)

## 2023-04-20 LAB — BASIC METABOLIC PANEL
Anion gap: 9 (ref 5–15)
BUN: 13 mg/dL (ref 8–23)
CO2: 28 mmol/L (ref 22–32)
Calcium: 9.4 mg/dL (ref 8.9–10.3)
Chloride: 102 mmol/L (ref 98–111)
Creatinine, Ser: 0.82 mg/dL (ref 0.44–1.00)
GFR, Estimated: >60 mL/min (ref >60)
Glucose, Bld: 114 mg/dL — ABNORMAL HIGH (ref 70–99)
Potassium: 4.5 mmol/L (ref 3.5–5.1)
Sodium: 139 mmol/L (ref 135–145)

## 2023-04-20 LAB — I-STAT VENOUS BLOOD GAS, ED
Acid-Base Excess: 7 mmol/L — ABNORMAL HIGH (ref 0.0–2.0)
Bicarbonate: 32.7 mmol/L — ABNORMAL HIGH (ref 20.0–28.0)
Calcium, Ion: 0.92 mmol/L — ABNORMAL LOW (ref 1.15–1.40)
HCT: 43 % (ref 36.0–46.0)
Hemoglobin: 14.6 g/dL (ref 12.0–15.0)
O2 Saturation: 79 %
Potassium: >8.5 mmol/L (ref 3.5–5.1)
Sodium: 130 mmol/L — ABNORMAL LOW (ref 135–145)
TCO2: 34 mmol/L — ABNORMAL HIGH (ref 22–32)
pCO2, Ven: 47.8 mmHg (ref 44–60)
pH, Ven: 7.443 — ABNORMAL HIGH (ref 7.25–7.43)
pO2, Ven: 42 mmHg (ref 32–45)

## 2023-04-20 MED ORDER — METHYLPREDNISOLONE SODIUM SUCC 125 MG IJ SOLR
125.0000 mg | Freq: Once | INTRAMUSCULAR | Status: AC
  Administered 2023-04-20: 125 mg via INTRAVENOUS
  Filled 2023-04-20: qty 2

## 2023-04-20 MED ORDER — ALBUTEROL SULFATE (2.5 MG/3ML) 0.083% IN NEBU
5.0000 mg | INHALATION_SOLUTION | RESPIRATORY_TRACT | Status: AC
  Administered 2023-04-20 (×3): 5 mg via RESPIRATORY_TRACT
  Filled 2023-04-20 (×3): qty 6

## 2023-04-20 MED ORDER — TERBUTALINE SULFATE 1 MG/ML IJ SOLN
0.2500 mg | Freq: Once | INTRAMUSCULAR | Status: DC
  Filled 2023-04-20: qty 0.25

## 2023-04-20 MED ORDER — METHYLPREDNISOLONE 4 MG PO TBPK: ORAL_TABLET | ORAL | 0 refills | Status: DC

## 2023-04-20 NOTE — ED Provider Notes (Signed)
[place] EMERGENCY DEPARTMENT AT [facility] Provider Note   CSN: [id number] Arrival date & time: 04/20/23  1850     History  Chief Complaint  Patient presents with   Shortness of Breath    Courtney Terry is a 62 y.o. female.  HPI    62 year old female with history of chronic respiratory failure secondary to COPD, usually on 1.5 L of continuous oxygen comes into the emergency room with chief complaint of shortness of breath.  Patient had actually called EMS because her husband was not doing well.  When the EMS arrived, they noted that patient herself was breathing heavily and they advised that she come to the emergency room.  Patient was placed on BiPAP because she was hypoxic and in respiratory distress.  Patient was also given albuterol and Atrovent treatment.  Patient states that over the last 2 days she has had some shortness of breath, but she attributed that to the weather change.  She does not use cocaine anymore.  She has not been around any smoke exposure.  She thinks that when her husband was cooking, the place got really hot and that might have triggered her shortness of breath.  She also thinks that she might have been anxious because her husband was not doing well.  She wants to go home.  Review of system is negative for fevers, chills, new cough.  Home Medications Prior to Admission medications   Medication Sig Start Date End Date Taking? Authorizing Provider  albuterol (PROVENTIL) (2.5 MG/3ML) 0.083% nebulizer solution Take 3 mLs (2.5 mg total) by nebulization every 6 (six) hours as needed for wheezing or shortness of breath. 03/26/23   Cobb, Ruby Cola, NP  albuterol (VENTOLIN HFA) 108 (90 Base) MCG/ACT inhaler TAKE 2 PUFFS BY MOUTH EVERY 6 HOURS AS NEEDED FOR WHEEZE OR SHORTNESS OF BREATH 11/11/22   Oretha Milch, MD  amLODipine (NORVASC) 5 MG tablet Take 5 mg by mouth daily. 03/17/21   [provider]  atorvastatin (LIPITOR) 40 MG tablet Take 1  tablet (40 mg total) by mouth daily. 03/13/23     CALCIUM PO Take 1 tablet by mouth daily.    [provider]  ergocalciferol (VITAMIN D2) 1.25 MG (50000 UT) capsule Take 1 capsule (50,000 Units total) by mouth once a week. 03/31/23     famotidine (PEPCID) 40 MG tablet Take 40 mg by mouth every evening. 04/12/22   [provider]  levothyroxine (SYNTHROID, LEVOTHROID) 75 MCG tablet Take 1 tablet (75 mcg total) by mouth daily. Patient not taking: Reported on 05/03/2022 12/24/18   Angiulli, Mcarthur Rossetti, PA-C  losartan (COZAAR) 25 MG tablet Take 25 mg by mouth daily. 04/14/20   [provider]  naloxone [facility]) nasal spray 4 mg/0.1 mL Place 0.4 mg into the nose as needed (accidental overdose). 03/22/21   [provider]  oxyCODONE-acetaminophen (PERCOCET) 10-325 MG tablet Take 1 tablet by mouth 4 (four) times daily as needed for pain 03/31/23     predniSONE (DELTASONE) 10 MG tablet Take 1 tablet (10 mg total) by mouth daily with breakfast. 03/26/23   Cobb, Ruby Cola, NP  predniSONE (DELTASONE) 10 MG tablet Take 1 tablet (10 mg total) by mouth daily. Take 4 pills daily for 3 days then 3 pills daily for 3 days then 2 pills daily for 3 days then 1 pil daily for 3 days then stop 03/26/23   Cobb, Ruby Cola, NP  tamoxifen (NOLVADEX) 20 MG tablet Take 1 tablet (  20 mg total) by mouth daily. 11/01/22   Malachy Mood, MD  tiZANidine (ZANAFLEX) 4 MG tablet Take 1 tablet (4 mg total) by mouth 4 (four) times daily for muscle spasms 03/31/23     icosapent Ethyl (VASCEPA) 1 g capsule Take 2 capsules (2 g total) by mouth 2 (two) times daily. 02/08/22 05/14/22        Allergies    Levaquin [levofloxacin]    Review of Systems   Review of Systems  All other systems reviewed and are negative.   Physical Exam Updated Vital Signs BP (!) 134/92   Pulse (!) 109   Temp 98.4 F (36.9 C) (Oral)   Resp 14   SpO2 98%  Physical Exam Vitals and nursing note reviewed.  Constitutional:      Appearance:  She is well-developed.  HENT:     Head: Atraumatic.  Cardiovascular:     Rate and Rhythm: Tachycardia present.  Pulmonary:     Effort: Pulmonary effort is normal. Tachypnea present.     Breath sounds: Decreased breath sounds present. No wheezing, rhonchi or rales.  Musculoskeletal:     Cervical back: Normal range of motion and neck supple.  Skin:    General: Skin is warm and dry.  Neurological:     Mental Status: She is alert and oriented to person, place, and time.     ED Results / Procedures / Treatments   Labs (all labs ordered are listed, but only abnormal results are displayed) Labs Reviewed  BASIC METABOLIC PANEL - Abnormal; Notable for the following components:      Result Value   Glucose, Bld 114 (*)    All other components within normal limits  CBC WITH DIFFERENTIAL/PLATELET - Abnormal; Notable for the following components:   WBC 16.3 (*)    MCHC 29.9 (*)    Neutro Abs 11.0 (*)    Monocytes Absolute 1.2 (*)    All other components within normal limits  I-STAT VENOUS BLOOD GAS, ED - Abnormal; Notable for the following components:   pH, Ven 7.443 (*)    Bicarbonate 32.7 (*)    TCO2 34 (*)    Acid-Base Excess 7.0 (*)    Sodium 130 (*)    Potassium >8.5 (*)    Calcium, Ion 0.92 (*)    All other components within normal limits  RESP PANEL BY RT-PCR (RSV, FLU A&B, COVID)  [id number]    EKG EKG Interpretation Date/Time:  Sunday April 20 2023 18:59:11 EDT Ventricular Rate:  127 PR Interval:  143 QRS Duration:  73 QT Interval:  311 QTC Calculation: 452 R Axis:   90  Text Interpretation: Sinus tachycardia Anterior infarct, old Since last tracing rate faster Confirmed by Richardean Canal ([id number]) on 04/20/2023 9:15:46 PM  Radiology DG Chest Port 1 View  Result Date: 04/20/2023 CLINICAL DATA:  Shortness of breath EXAM: PORTABLE CHEST 1 VIEW COMPARISON:  CT chest 05/02/2022 and radiographs 05/02/2022 FINDINGS: Stable cardiomediastinal silhouette. Aortic atherosclerotic  calcification. No focal consolidation, pleural effusion, or pneumothorax. No displaced rib fractures. Hyperinflation Surgical clips right axilla. IMPRESSION: Emphysema without acute cardiopulmonary process. Electronically Signed   By: Minerva Fester M.D.   On: 04/20/2023 19:48    Procedures .Critical Care  Performed by: Derwood Kaplan, MD Authorized by: Derwood Kaplan, MD   Critical care provider statement:    Critical care time (minutes):  35   Critical care was necessary to treat or prevent imminent or life-threatening deterioration of the following conditions:  Respiratory  failure   Critical care was time spent personally by me on the following activities:  Development of treatment plan with patient or surrogate, discussions with consultants, evaluation of patient's response to treatment, examination of patient, ordering and review of laboratory studies, ordering and review of radiographic studies, ordering and performing treatments and interventions, pulse oximetry, re-evaluation of patient's condition and review of old charts     Medications Ordered in ED Medications  terbutaline (BRETHINE) injection 0.25 mg (has no administration in time range)  methylPREDNISolone sodium succinate (SOLU-MEDROL) 125 mg/2 mL injection 125 mg (125 mg Intravenous Given 04/20/23 2009)  albuterol (PROVENTIL) (2.5 MG/3ML) 0.083% nebulizer solution 5 mg (5 mg Nebulization Given 04/20/23 2101)    ED Course/ Medical Decision Making/ A&P                             Medical Decision Making Amount and/or Complexity of Data Reviewed Labs: ordered. Radiology: ordered.  Risk Prescription drug management.  62 year old patient comes in with chief complaint of shortness of breath.  She was found to be in respiratory distress by EMS and hypoxic, therefore they put her on CPAP and brought her to the emergency room.  She has history of COPD with chronic respiratory failure.  Previous history of cocaine use also  noted.  On exam, patient is not currently wheezing.  She has diminished breath sounds diffusely, that is likely sequelae of her chronic respiratory disease.  She is tachypneic and tachycardic. Patient however is able to complete sentences.  It appears that she really was calling EMS for her husband, who is actually in the emergency room.  EMS noted that she was also not looking well and advised that she come to the emergency room.  Patient currently wants to leave.  I have placed on a BiPAP.  We will reassess her.  Will give her another breathing treatment and also give her terbutaline and Solu-Medrol.   9:27 PM Patient reassessed.  She feels a lot better.  She is no longer as tachycardic.  Her respiratory rate has also improved.  We will remove BiPAP at this time.  She will need reassessment around 1015.  If she is doing well, then she can be discharged otherwise she will need admission.  Patient's lab reveals elevated white count.  I have a and independently interpreted patient's chest x-ray, there is no evidence of pneumothorax or pneumonia.  I also reviewed patient's records including recent discharge summary and pulmonary clinic visits. I still think patient can go home and COPD exacerbation if she is doing well.  Final Clinical Impression(s) / ED Diagnoses Final diagnoses:  COPD exacerbation [facility])    Rx / DC Orders ED Discharge Orders     None         Derwood Kaplan, MD 04/20/23 2128

## 2023-04-20 NOTE — ED Triage Notes (Signed)
Patient BIB [facility] from home for SOB, Patient O2 88% on 2L, her baseline. EMS reports that patient was tripoding and placed on CPAP with neb, patient now on 10mg  Albuterol neb treatment and received 0.5 atrovent as well. EMS reports absent lung sounds for them prior to treatment. Patient appears diaphoretic and short of breath at this time. O2 99% on neb treatment.

## 2023-04-20 NOTE — Discharge Instructions (Addendum)
Take Medrol dose pack as prescribed   Use oxygen at home   Use albuterol every 4 hrs as needed for cough or shortness of breath   See your doctor for follow up   Return to ER if you have worse shortness of breath, cough, fever

## 2023-04-20 NOTE — ED Notes (Signed)
Patient reports that she is fine and ready to go back home.

## 2023-04-27 ENCOUNTER — Other Ambulatory Visit: Payer: Self-pay | Admitting: Hematology

## 2023-04-29 ENCOUNTER — Encounter: Payer: Self-pay | Admitting: Hematology

## 2023-04-29 ENCOUNTER — Other Ambulatory Visit (HOSPITAL_COMMUNITY): Payer: Self-pay

## 2023-04-29 MED ORDER — ERGOCALCIFEROL 1.25 MG (50000 UT) PO CAPS
50000.0000 [IU] | ORAL_CAPSULE | ORAL | 1 refills | Status: AC
Start: 2023-04-29 — End: ?
  Filled 2023-04-29: qty 12, 84d supply, fill #0

## 2023-04-29 MED ORDER — OXYCODONE-ACETAMINOPHEN 10-325 MG PO TABS
1.0000 | ORAL_TABLET | Freq: Four times a day (QID) | ORAL | 0 refills | Status: AC | PRN
Start: 2023-04-29 — End: ?
  Filled 2023-04-29: qty 120, 30d supply, fill #0

## 2023-04-29 MED ORDER — TIZANIDINE HCL 4 MG PO TABS
4.0000 mg | ORAL_TABLET | Freq: Four times a day (QID) | ORAL | 0 refills | Status: AC | PRN
Start: 2023-04-29 — End: ?
  Filled 2023-04-29: qty 120, 30d supply, fill #0

## 2023-04-30 ENCOUNTER — Encounter: Payer: Self-pay | Admitting: Hematology

## 2023-04-30 ENCOUNTER — Other Ambulatory Visit (HOSPITAL_COMMUNITY): Payer: Self-pay

## 2023-05-03 ENCOUNTER — Other Ambulatory Visit: Payer: Self-pay

## 2023-05-03 DIAGNOSIS — C50411 Malignant neoplasm of upper-outer quadrant of right female breast: Secondary | ICD-10-CM

## 2023-05-04 NOTE — Assessment & Plan Note (Deleted)
Recurrent right breast cancer, [id number], stage Ia, ER100+, PR-, HER2- -initially diagnosed in 12/2011, ER+/PR+/HER2-, s/p lumpectomy. She did not have any adjuvant therapy. -She had right breast recurrence in 12/2014, ER+/PR-/HER2-. S/p right mastectomy, path showing multifocal IDC and ILC, and bilateral breast reconstruction. -Oncotype RS was 30 (high risk). Due to her poor compliance and medical comorbilities, adjuvant chemo was not pursued  -She started anastrozole in 04/2015, but has been noncompliant with f/u and taking anastrozole consistently. She was reportedly compliant with anastrozole from 10/2020 to ~05/2021 when the prescription ran out.  -I switched her to tamoxifen in January 2023 due to her osteoporosis, she is tolerating well.  Plan to continue until June 2026.

## 2023-05-05 ENCOUNTER — Inpatient Hospital Stay: Payer: MEDICAID | Attending: Hematology

## 2023-05-05 ENCOUNTER — Inpatient Hospital Stay: Payer: MEDICAID | Admitting: Hematology

## 2023-05-05 DIAGNOSIS — Z170 Estrogen receptor positive status [ER+]: Secondary | ICD-10-CM
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
## 2023-05-07 ENCOUNTER — Telehealth: Payer: Self-pay | Admitting: Hematology

## 2023-05-08 ENCOUNTER — Other Ambulatory Visit (HOSPITAL_COMMUNITY): Payer: Self-pay

## 2023-05-13 ENCOUNTER — Inpatient Hospital Stay: Payer: MEDICAID

## 2023-05-13 ENCOUNTER — Inpatient Hospital Stay: Payer: MEDICAID | Admitting: Hematology

## 2023-05-27 ENCOUNTER — Other Ambulatory Visit (HOSPITAL_COMMUNITY): Payer: Self-pay

## 2023-05-27 MED ORDER — OXYCODONE-ACETAMINOPHEN 10-325 MG PO TABS
1.0000 | ORAL_TABLET | Freq: Four times a day (QID) | ORAL | 0 refills | Status: AC | PRN
  Filled 2023-05-27: qty 120, 30d supply, fill #0

## 2023-05-27 MED ORDER — TIZANIDINE HCL 4 MG PO TABS
4.0000 mg | ORAL_TABLET | Freq: Four times a day (QID) | ORAL | 0 refills | Status: AC | PRN
  Filled 2023-05-27: qty 120, 30d supply, fill #0

## 2023-05-28 ENCOUNTER — Other Ambulatory Visit (HOSPITAL_COMMUNITY): Payer: Self-pay

## 2023-05-28 ENCOUNTER — Other Ambulatory Visit: Payer: Self-pay

## 2023-06-03 ENCOUNTER — Ambulatory Visit: Payer: MEDICAID | Admitting: Adult Health

## 2023-06-03 ENCOUNTER — Encounter: Payer: Self-pay | Admitting: Adult Health

## 2023-06-03 VITALS — BP 114/80 | HR 106 | Temp 98.1°F | Ht 69.0 in | Wt 216.0 lb

## 2023-06-03 DIAGNOSIS — Z23 Encounter for immunization: Secondary | ICD-10-CM

## 2023-06-03 DIAGNOSIS — Z87891 Personal history of nicotine dependence: Secondary | ICD-10-CM | POA: Diagnosis not present

## 2023-06-03 DIAGNOSIS — J449 Chronic obstructive pulmonary disease, unspecified: Secondary | ICD-10-CM | POA: Diagnosis not present

## 2023-06-03 DIAGNOSIS — J9611 Chronic respiratory failure with hypoxia: Secondary | ICD-10-CM

## 2023-06-03 DIAGNOSIS — J441 Chronic obstructive pulmonary disease with (acute) exacerbation: Secondary | ICD-10-CM | POA: Diagnosis not present

## 2023-06-03 DIAGNOSIS — I5032 Chronic diastolic (congestive) heart failure: Secondary | ICD-10-CM

## 2023-06-03 DIAGNOSIS — Z720 Tobacco use: Secondary | ICD-10-CM

## 2023-06-03 MED ORDER — PREDNISONE 5 MG PO TABS: 5.0000 mg | ORAL_TABLET | Freq: Every day | ORAL | 3 refills | Status: DC
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
## 2023-06-03 NOTE — Patient Instructions (Addendum)
Restart Trelegy 1 puff daily, rinse after use.  Trelegy patient assistance paperwork.  Albuterol inhaler or neb As needed   Prevnar 20 vaccine.  Decrease Prednisone 5mg  daily.  Continue on Oxygen 2l/m, keep O2 sats >88-90%.  Continue on BIPAP At bedtime , wear all night long  BIPAP download  Activity as tolerated.  Refer to LDCT chest screening program.  Follow up with Dr. Vassie Loll  or Deontae Robson In 6-8 weeks with PFT and As needed

## 2023-06-04 MED ORDER — TRELEGY ELLIPTA 200-62.5-25 MCG/ACT IN AEPB: 1.0000 | INHALATION_SPRAY | Freq: Every day | RESPIRATORY_TRACT | 0 refills | Status: DC

## 2023-06-04 NOTE — Assessment & Plan Note (Signed)
Recent COPD exacerbation-now improved.  Patient is to change back to triple therapy with Trelegy.  Patient assistance program has been requested.  Prevnar 20 vaccine today. Referred to the lung cancer CT screening program Will try to slowly wean prednisone down.  Decrease down to 5 mg daily and hold at this dose until seen back in the office  Plan  Patient Instructions  Restart Trelegy 1 puff daily, rinse after use.  Trelegy patient assistance paperwork.  Albuterol inhaler or neb As needed   Prevnar 20 vaccine.  Decrease Prednisone 5mg  daily.  Continue on Oxygen 2l/m, keep O2 sats >88-90%.  Continue on BIPAP At bedtime , wear all night long  BIPAP download  Activity as tolerated.  Refer to LDCT chest screening program.  Follow up with Dr. Vassie Loll  or Jamiyah Dingley In 6-8 weeks with PFT and As needed

## 2023-06-04 NOTE — Assessment & Plan Note (Signed)
Appears euvolemic on exam. 

## 2023-06-04 NOTE — Assessment & Plan Note (Signed)
Referred to the lung cancer CT screening program

## 2023-06-04 NOTE — Assessment & Plan Note (Signed)
Continue on oxygen maintain O2 saturation greater than 88 to 90% Continue on nocturnal BiPAP.  The BiPAP download has been requested

## 2023-06-24 ENCOUNTER — Other Ambulatory Visit (HOSPITAL_COMMUNITY): Payer: Self-pay

## 2023-06-24 MED ORDER — ERGOCALCIFEROL 1.25 MG (50000 UT) PO CAPS
1.0000 | ORAL_CAPSULE | ORAL | 1 refills | Status: AC
  Filled 2023-06-24: qty 12, 84d supply, fill #0

## 2023-06-24 MED ORDER — OXYCODONE-ACETAMINOPHEN 10-325 MG PO TABS
1.0000 | ORAL_TABLET | Freq: Four times a day (QID) | ORAL | 0 refills | Status: AC
Start: 2023-06-24 — End: ?
  Filled 2023-06-25: qty 100, 25d supply, fill #0
  Filled 2023-06-25: qty 70, 18d supply, fill #0
  Filled 2023-06-25: qty 50, 13d supply, fill #1
  Filled 2023-06-25: qty 120, 30d supply, fill #0

## 2023-06-24 MED ORDER — TIZANIDINE HCL 4 MG PO TABS
4.0000 mg | ORAL_TABLET | Freq: Four times a day (QID) | ORAL | 0 refills | Status: AC
  Filled 2023-06-24 – 2023-06-25 (×2): qty 120, 30d supply, fill #0

## 2023-06-25 ENCOUNTER — Other Ambulatory Visit (HOSPITAL_COMMUNITY): Payer: Self-pay

## 2023-06-25 ENCOUNTER — Other Ambulatory Visit (HOSPITAL_BASED_OUTPATIENT_CLINIC_OR_DEPARTMENT_OTHER): Payer: Self-pay

## 2023-06-25 ENCOUNTER — Encounter: Payer: Self-pay | Admitting: Hematology

## 2023-06-25 ENCOUNTER — Other Ambulatory Visit: Payer: Self-pay

## 2023-07-11 ENCOUNTER — Encounter: Payer: Self-pay | Admitting: Adult Health

## 2023-07-11 ENCOUNTER — Telehealth: Payer: Self-pay | Admitting: Adult Health

## 2023-07-11 NOTE — Telephone Encounter (Signed)
New message    Patient calling the office for samples of medication:   1.  What medication and dosage are you requesting samples for? Fluticasone-Umeclidin-Vilant (TRELEGY ELLIPTA) 200-62.5-25 MCG/ACT AEPB   2.  Are you currently out of this medication?  yes

## 2023-07-23 ENCOUNTER — Other Ambulatory Visit (HOSPITAL_COMMUNITY): Payer: Self-pay

## 2023-07-23 MED ORDER — OXYCODONE-ACETAMINOPHEN 10-325 MG PO TABS
1.0000 | ORAL_TABLET | Freq: Four times a day (QID) | ORAL | 0 refills | Status: AC | PRN
Start: 2023-07-23 — End: ?
  Filled 2023-07-23: qty 120, 30d supply, fill #0

## 2023-07-23 MED ORDER — TIZANIDINE HCL 4 MG PO TABS
4.0000 mg | ORAL_TABLET | Freq: Four times a day (QID) | ORAL | 0 refills | Status: AC | PRN
Start: 2023-07-23 — End: ?
  Filled 2023-07-23: qty 100, 25d supply, fill #0
  Filled 2023-07-23: qty 20, 5d supply, fill #0

## 2023-07-30 ENCOUNTER — Other Ambulatory Visit (HOSPITAL_COMMUNITY): Payer: Self-pay

## 2023-07-30 MED ORDER — ATORVASTATIN CALCIUM 40 MG PO TABS
40.0000 mg | ORAL_TABLET | Freq: Every day | ORAL | 0 refills | Status: AC
  Filled 2023-07-30: qty 90, 90d supply, fill #0

## 2023-07-30 MED ORDER — LOSARTAN POTASSIUM 25 MG PO TABS
25.0000 mg | ORAL_TABLET | Freq: Every day | ORAL | 1 refills | Status: AC
  Filled 2023-07-30 – 2024-07-05 (×2): qty 90, 90d supply, fill #0

## 2023-08-06 ENCOUNTER — Other Ambulatory Visit (HOSPITAL_COMMUNITY): Payer: Self-pay

## 2023-08-20 ENCOUNTER — Other Ambulatory Visit (HOSPITAL_COMMUNITY): Payer: Self-pay

## 2023-08-20 MED ORDER — TIZANIDINE HCL 4 MG PO TABS
4.0000 mg | ORAL_TABLET | Freq: Four times a day (QID) | ORAL | 0 refills | Status: DC | PRN
  Filled 2023-08-20: qty 120, 30d supply, fill #0

## 2023-08-20 MED ORDER — OXYCODONE-ACETAMINOPHEN 10-325 MG PO TABS
1.0000 | ORAL_TABLET | Freq: Four times a day (QID) | ORAL | 0 refills | Status: DC | PRN
Start: 2023-08-20 — End: 2023-09-18
  Filled 2023-08-20: qty 120, 30d supply, fill #0

## 2023-08-20 MED ORDER — VITAMIN D (ERGOCALCIFEROL) 1.25 MG (50000 UNIT) PO CAPS
50000.0000 [IU] | ORAL_CAPSULE | ORAL | 1 refills | Status: AC
  Filled 2023-08-20: qty 12, 84d supply, fill #0

## 2023-08-26 NOTE — Telephone Encounter (Signed)
PT calling again for Trelegy sample's Adv DO NOT come in for them until we call and bring ID. She is out of medication

## 2023-09-01 MED ORDER — TRELEGY ELLIPTA 200-62.5-25 MCG/ACT IN AEPB: 1.0000 | INHALATION_SPRAY | Freq: Every day | RESPIRATORY_TRACT | 2 refills | Status: DC

## 2023-09-01 NOTE — Telephone Encounter (Signed)
Trelegy rx sent to [facility]  Per last OV note patient was to complete Patient Assistance forms. Have these been done? Has she contacted them?  LVM for pt to return call.

## 2023-09-08 ENCOUNTER — Ambulatory Visit: Payer: MEDICAID | Admitting: Adult Health

## 2023-09-18 ENCOUNTER — Other Ambulatory Visit (HOSPITAL_COMMUNITY): Payer: Self-pay

## 2023-09-18 MED ORDER — OXYCODONE-ACETAMINOPHEN 10-325 MG PO TABS
1.0000 | ORAL_TABLET | Freq: Four times a day (QID) | ORAL | 0 refills | Status: DC | PRN
Start: 2023-09-18 — End: 2023-10-20
  Filled 2023-09-18: qty 120, 30d supply, fill #0

## 2023-09-18 MED ORDER — TIZANIDINE HCL 4 MG PO TABS
4.0000 mg | ORAL_TABLET | Freq: Four times a day (QID) | ORAL | 0 refills | Status: DC | PRN
  Filled 2023-09-18: qty 120, 30d supply, fill #0

## 2023-10-04 ENCOUNTER — Other Ambulatory Visit: Payer: Self-pay | Admitting: Pulmonary Disease

## 2023-10-20 ENCOUNTER — Other Ambulatory Visit (HOSPITAL_COMMUNITY): Payer: Self-pay

## 2023-10-20 MED ORDER — TIZANIDINE HCL 4 MG PO TABS
4.0000 mg | ORAL_TABLET | Freq: Four times a day (QID) | ORAL | 0 refills | Status: DC | PRN
  Filled 2023-10-20: qty 120, 30d supply, fill #0

## 2023-10-20 MED ORDER — OXYCODONE-ACETAMINOPHEN 10-325 MG PO TABS
1.0000 | ORAL_TABLET | Freq: Four times a day (QID) | ORAL | 0 refills | Status: DC | PRN
Start: 2023-10-20 — End: 2023-11-17
  Filled 2023-10-20: qty 120, 30d supply, fill #0

## 2023-11-04 ENCOUNTER — Encounter: Payer: Self-pay | Admitting: Adult Health

## 2023-11-04 ENCOUNTER — Ambulatory Visit: Payer: MEDICAID | Admitting: Adult Health

## 2023-11-13 ENCOUNTER — Other Ambulatory Visit: Payer: Self-pay | Admitting: Hematology

## 2023-11-17 ENCOUNTER — Other Ambulatory Visit (HOSPITAL_COMMUNITY): Payer: Self-pay

## 2023-11-17 MED ORDER — OXYCODONE-ACETAMINOPHEN 10-325 MG PO TABS
1.0000 | ORAL_TABLET | Freq: Four times a day (QID) | ORAL | 0 refills | Status: AC | PRN
Start: 2023-11-17 — End: ?
  Filled 2023-11-17: qty 120, 30d supply, fill #0

## 2023-12-06 ENCOUNTER — Other Ambulatory Visit: Payer: Self-pay

## 2023-12-06 ENCOUNTER — Emergency Department (HOSPITAL_COMMUNITY): Payer: MEDICAID

## 2023-12-06 ENCOUNTER — Emergency Department (HOSPITAL_COMMUNITY)
Admission: EM | Admit: 2023-12-06 | Discharge: 2023-12-06 | Disposition: A | Discharge status: Home / Self Care | Payer: MEDICAID | Attending: Emergency Medicine | Admitting: Emergency Medicine

## 2023-12-06 DIAGNOSIS — Z7951 Long term (current) use of inhaled steroids: Secondary | ICD-10-CM | POA: Insufficient documentation

## 2023-12-06 DIAGNOSIS — J441 Chronic obstructive pulmonary disease with (acute) exacerbation: Secondary | ICD-10-CM | POA: Diagnosis not present

## 2023-12-06 DIAGNOSIS — Z7952 Long term (current) use of systemic steroids: Secondary | ICD-10-CM | POA: Diagnosis not present

## 2023-12-06 DIAGNOSIS — W130XXA Fall from, out of or through balcony, initial encounter: Secondary | ICD-10-CM | POA: Diagnosis not present

## 2023-12-06 DIAGNOSIS — S82831A Other fracture of upper and lower end of right fibula, initial encounter for closed fracture: Secondary | ICD-10-CM | POA: Diagnosis not present

## 2023-12-06 DIAGNOSIS — M79604 Pain in right leg: Secondary | ICD-10-CM | POA: Diagnosis present

## 2023-12-06 LAB — CBC WITH DIFFERENTIAL/PLATELET
Abs Immature Granulocytes: 0.06 10*3/uL (ref 0.00–0.07)
Basophils Absolute: 0 10*3/uL (ref 0.0–0.1)
Basophils Relative: 0 %
Eosinophils Absolute: 0.2 10*3/uL (ref 0.0–0.5)
Eosinophils Relative: 1 %
HCT: 40.6 % (ref 36.0–46.0)
Hemoglobin: 11.9 g/dL — ABNORMAL LOW (ref 12.0–15.0)
Immature Granulocytes: 0 %
Lymphocytes Relative: 23 %
Lymphs Abs: 3.4 10*3/uL (ref 0.7–4.0)
MCH: 26.4 pg (ref 26.0–34.0)
MCHC: 29.3 g/dL — ABNORMAL LOW (ref 30.0–36.0)
MCV: 90 fL (ref 80.0–100.0)
Monocytes Absolute: 0.9 10*3/uL (ref 0.1–1.0)
Monocytes Relative: 6 %
Neutro Abs: 10 10*3/uL — ABNORMAL HIGH (ref 1.7–7.7)
Neutrophils Relative %: 70 %
Platelets: 257 10*3/uL (ref 150–400)
RBC: 4.51 MIL/uL (ref 3.87–5.11)
RDW: 14.9 % (ref 11.5–15.5)
WBC: 14.6 10*3/uL — ABNORMAL HIGH (ref 4.0–10.5)
nRBC: 0 % (ref 0.0–0.2)

## 2023-12-06 LAB — RESP PANEL BY RT-PCR (RSV, FLU A&B, COVID)  RVPGX2
Influenza A by PCR: NEGATIVE
Influenza B by PCR: NEGATIVE
Resp Syncytial Virus by PCR: NEGATIVE
SARS Coronavirus 2 by RT PCR: NEGATIVE

## 2023-12-06 LAB — COMPREHENSIVE METABOLIC PANEL
ALT: 13 U/L (ref 0–44)
AST: 21 U/L (ref 15–41)
Albumin: 3.2 g/dL — ABNORMAL LOW (ref 3.5–5.0)
Alkaline Phosphatase: 62 U/L (ref 38–126)
Anion gap: 13 (ref 5–15)
BUN: 11 mg/dL (ref 8–23)
CO2: 31 mmol/L (ref 22–32)
Calcium: 9.5 mg/dL (ref 8.9–10.3)
Chloride: 98 mmol/L (ref 98–111)
Creatinine, Ser: 0.63 mg/dL (ref 0.44–1.00)
GFR, Estimated: >60 mL/min (ref >60)
Glucose, Bld: 97 mg/dL (ref 70–99)
Potassium: 3.8 mmol/L (ref 3.5–5.1)
Sodium: 142 mmol/L (ref 135–145)
Total Bilirubin: 0.5 mg/dL (ref 0.0–1.2)
Total Protein: 6.5 g/dL (ref 6.5–8.1)

## 2023-12-06 MED ORDER — ALBUTEROL SULFATE (2.5 MG/3ML) 0.083% IN NEBU
5.0000 mg | INHALATION_SOLUTION | Freq: Once | RESPIRATORY_TRACT | Status: AC
  Administered 2023-12-06: 5 mg via RESPIRATORY_TRACT
  Filled 2023-12-06: qty 6

## 2023-12-06 MED ORDER — OXYCODONE-ACETAMINOPHEN 5-325 MG PO TABS
1.0000 | ORAL_TABLET | Freq: Once | ORAL | Status: AC
  Administered 2023-12-06: 1 via ORAL
  Filled 2023-12-06: qty 1

## 2023-12-06 MED ORDER — IPRATROPIUM BROMIDE 0.02 % IN SOLN
0.5000 mg | Freq: Once | RESPIRATORY_TRACT | Status: AC
  Administered 2023-12-06: 0.5 mg via RESPIRATORY_TRACT
  Filled 2023-12-06: qty 2.5

## 2023-12-06 MED ORDER — METHYLPREDNISOLONE 4 MG PO TBPK: ORAL_TABLET | ORAL | 0 refills | Status: DC

## 2023-12-06 MED ORDER — FENTANYL CITRATE PF 50 MCG/ML IJ SOSY
50.0000 ug | PREFILLED_SYRINGE | Freq: Once | INTRAMUSCULAR | Status: AC
  Administered 2023-12-06: 50 ug via INTRAVENOUS
  Filled 2023-12-06: qty 1

## 2023-12-06 MED ORDER — PREDNISONE 20 MG PO TABS
60.0000 mg | ORAL_TABLET | Freq: Once | ORAL | Status: AC
  Administered 2023-12-06: 60 mg via ORAL
  Filled 2023-12-06: qty 3

## 2023-12-06 NOTE — ED Notes (Signed)
 Pt was BIB EMS and did not have her O2 tank brought with her. She is on O2 at baseline. Pt reports that family will be going home to bring her O2 tank from home so she can leave safely.

## 2023-12-06 NOTE — Progress Notes (Signed)
 Orthopedic Tech Progress Note Patient Details:  Courtney Terry [birth year] [id number]  Ortho Devices Type of Ortho Device: Knee Sleeve Ortho Device/Splint Location: rle Ortho Device/Splint Interventions: Ordered, Application   Post Interventions Patient Tolerated: Well Instructions Provided: Care of device, Adjustment of device  Trinna Post 12/06/2023, 8:33 PM

## 2023-12-06 NOTE — ED Notes (Signed)
 Ortho tech called

## 2023-12-06 NOTE — Discharge Instructions (Addendum)
 You have fibular fracture.  Your hardware is in place.  Please continue taking your pain medicine as prescribed by your pain management doctor.  If you run out early you need to ask for early refill with your doctor  You have COPD exacerbation.  Take steroids as prescribed.  Use albuterol as needed and use your oxygen as needed  See your doctor for follow-up  You should also follow-up with [facility] and use your walker at home  Return to ER if you have severe pain, unable to walk, shortness of breath

## 2023-12-06 NOTE — ED Triage Notes (Signed)
 According to [facility]: Pt called for fall yesterday, having right leg pain today. Leg is "mobile" when palpated. Was not complaining of sob but was 88% on 2 l (baseline o2). Co2 62%, diminished lung sounds. Co2 now 57% spo2 92% on nebulizer.  Pt report cold.   BP 170/90 Hr 90     HX COPD  AOx4

## 2023-12-06 NOTE — ED Provider Notes (Signed)
 [place] [facility]MENT AT [facility] Provider Note   CSN: [id number] Arrival date & time: 12/06/23  1532     History  Chief Complaint  Patient presents with   sob/ leg pain    LUANN ASPINWALL is a 63 y.o. female history of COPD, previous right tibial plateau fracture status post surgery here presenting with leg pain and shortness of breath.  Patient states that she had a fall 2 days ago.  She states that she injured her right knee.  She states that she has been wrapping it and she is complaining more pain when she walks.  Patient also is on oxygen at baseline.  Patient was noted to have oxygen 88% on her baseline 2 L.  Patient states that she has some cough congestion as well.  Patient is on chronic pain medicine  The history is provided by the patient.       Home Medications Prior to Admission medications   Medication Sig Start Date End Date Taking? Authorizing Provider  albuterol (PROVENTIL) (2.5 MG/3ML) 0.083% nebulizer solution Take 3 mLs (2.5 mg total) by nebulization every 6 (six) hours as needed for wheezing or shortness of breath. 03/26/23   Cobb, Ruby Cola, NP  albuterol (VENTOLIN HFA) 108 (90 Base) MCG/ACT inhaler TAKE 2 PUFFS BY MOUTH EVERY 6 HOURS AS NEEDED FOR WHEEZE OR SHORTNESS OF BREATH 10/04/23   Oretha Milch, MD  amLODipine (NORVASC) 5 MG tablet Take 5 mg by mouth daily. 03/17/21   [provider]  atorvastatin (LIPITOR) 40 MG tablet Take 1 tablet (40 mg total) by mouth daily. 03/13/23     atorvastatin (LIPITOR) 40 MG tablet Take 1 tablet (40 mg total) by mouth daily. 07/30/23     CALCIUM PO Take 1 tablet by mouth daily.    [provider]  ergocalciferol (VITAMIN D2) 1.25 MG (50000 UT) capsule Take 1 capsule (50,000 Units total) by mouth once a week. 03/31/23     ergocalciferol (VITAMIN D2) 1.25 MG (50000 UT) capsule Take 1 capsule (50,000 Units total) by mouth once a week. 04/29/23     ergocalciferol (VITAMIN D2) 1.25 MG (50000  UT) capsule Take 1 capsule (50,000 Units total) by mouth once a week. 06/24/23     famotidine (PEPCID) 40 MG tablet Take 40 mg by mouth every evening. 04/12/22   [provider]  Fluticasone-Umeclidin-Vilant (TRELEGY ELLIPTA) 200-62.5-25 MCG/ACT AEPB Inhale 1 puff into the lungs daily. 09/01/23   Parrett, Virgel Bouquet, NP  levothyroxine (SYNTHROID, LEVOTHROID) 75 MCG tablet Take 1 tablet (75 mcg total) by mouth daily. 12/24/18   Angiulli, Mcarthur Rossetti, PA-C  losartan (COZAAR) 25 MG tablet Take 25 mg by mouth daily. 04/14/20   [provider]  losartan (COZAAR) 25 MG tablet Take 1 tablet (25 mg total) by mouth daily. 07/30/23     naloxone (NARCAN) nasal spray 4 mg/0.1 mL Place 0.4 mg into the nose as needed (accidental overdose). 03/22/21   [provider]  oxyCODONE-acetaminophen (PERCOCET) 10-325 MG tablet Take 1 tablet by mouth 4 (four) times daily as needed for pain 03/31/23     oxyCODONE-acetaminophen (PERCOCET) 10-325 MG tablet Take 1 tablet by mouth 4 (four) times daily as needed for pain 04/29/23     oxyCODONE-acetaminophen (PERCOCET) 10-325 MG tablet Take 1 tablet by mouth 4 (four) times daily as needed for pain. 05/27/23     oxyCODONE-acetaminophen (PERCOCET) 10-325 MG tablet Take 1 tablet by mouth 4 (four) times daily as needed for pain.  06/24/23     oxyCODONE-acetaminophen (PERCOCET) 10-325 MG tablet Take 1 tablet by mouth 4 (four) times daily as needed for pain 07/23/23     oxyCODONE-acetaminophen (PERCOCET) 10-325 MG tablet Take 1 tablet by mouth 4 (four) times daily as needed for pain. 11/17/23     predniSONE (DELTASONE) 5 MG tablet Take 1 tablet (5 mg total) by mouth daily with breakfast. 06/03/23   Parrett, Virgel Bouquet, NP  tamoxifen (NOLVADEX) 20 MG tablet TAKE 1 TABLET BY MOUTH EVERY DAY 11/13/23   Malachy Mood, MD  tiZANidine (ZANAFLEX) 4 MG tablet Take 1 tablet (4 mg total) by mouth 4 (four) times daily for muscle spasms 03/31/23     tiZANidine (ZANAFLEX) 4 MG tablet Take 1 tablet (4  mg total) by mouth 4 (four) times daily as needed for muscle spasms 04/29/23     tiZANidine (ZANAFLEX) 4 MG tablet Take 1 tablet (4 mg total) by mouth 4 (four) times daily as needed for muscle spasms. 05/27/23     tiZANidine (ZANAFLEX) 4 MG tablet Take 1 tablet (4 mg total) by mouth 4 (four) times daily as needed for muscle spasms. *separate from opioid by 1-2 hours to avoid oversedation* 06/24/23     tiZANidine (ZANAFLEX) 4 MG tablet Take 1 tablet  by mouth 4 (four) times daily as needed for muscle spasms, Pt instructed on safe medication administration including spacing of opioids and muscle relaxer 1-2 hours to avoid oversedation 07/23/23     tiZANidine (ZANAFLEX) 4 MG tablet Take 1 tablet (4 mg total) by mouth 4 (four) times daily as needed for muscle spasms. Avoid taking with opioids and muscle relaxer within 1-2 hours to avoid oversedation. 10/20/23     Vitamin D, Ergocalciferol, (DRISDOL) 1.25 MG (50000 UNIT) CAPS capsule Take 1 capsule (50,000 Units total) by mouth once a week. 08/20/23     icosapent Ethyl (VASCEPA) 1 g capsule Take 2 capsules (2 g total) by mouth 2 (two) times daily. 02/08/22 05/14/22        Allergies    Levaquin [levofloxacin]    Review of Systems   Review of Systems  Respiratory:  Positive for shortness of breath.   Musculoskeletal:        Right leg pain  All other systems reviewed and are negative.   Physical Exam Updated Vital Signs BP (!) 138/98   Pulse 80   Temp (!) 97.5 F (36.4 C)   Resp 17   Ht 5\' 9"  (1.753 m)   Wt 97.5 kg   SpO2 94%   BMI 31.75 kg/m  Physical Exam Vitals and nursing note reviewed.  Constitutional:      Comments: Uncomfortable  HENT:     Head: Normocephalic.     Nose: Nose normal.  Eyes:     Extraocular Movements: Extraocular movements intact.     Pupils: Pupils are equal, round, and reactive to light.  Cardiovascular:     Rate and Rhythm: Normal rate and regular rhythm.     Pulses: Normal pulses.  Pulmonary:     Comments:  Diminished bilaterally and no obvious wheezing Abdominal:     General: Abdomen is flat.     Palpations: Abdomen is soft.  Musculoskeletal:     Cervical back: Normal range of motion and neck supple.     Comments: Patient has postsurgical scar of the proximal tibial area.  Patient has some tenderness of the tib-fib and also ankle and foot.  Patient has 2+ DP pulse and able to wiggle her  toes.  Skin:    General: Skin is warm.     Capillary Refill: Capillary refill takes less than 2 seconds.  Neurological:     General: No focal deficit present.     Mental Status: She is oriented to person, place, and time.  Psychiatric:        Mood and Affect: Mood normal.        Behavior: Behavior normal.     ED Results / Procedures / Treatments   Labs (all labs ordered are listed, but only abnormal results are displayed) Labs Reviewed  CBC WITH DIFFERENTIAL/PLATELET - Abnormal; Notable for the following components:      Result Value   WBC 14.6 (*)    Hemoglobin 11.9 (*)    MCHC 29.3 (*)    Neutro Abs 10.0 (*)    All other components within normal limits  COMPREHENSIVE METABOLIC PANEL - Abnormal; Notable for the following components:   Albumin 3.2 (*)    All other components within normal limits  RESP PANEL BY RT-PCR (RSV, FLU A&B, COVID)  [id number]    EKG None  Radiology DG Chest 1 View Result Date: 12/06/2023 CLINICAL DATA:  Shortness of breath, cough. EXAM: CHEST  1 VIEW COMPARISON:  Chest radiograph 04/20/2023 chest CT 05/02/2022 FINDINGS: Emphysema with chronic hyperinflation and bronchial thickening. No acute airspace disease. Normal heart size with stable mediastinal contours. No pulmonary edema, large pleural effusion or pneumothorax. Home limited assessment, no acute osseous findings. Right axillary surgical clips. IMPRESSION: Emphysema with chronic hyperinflation and bronchial thickening. No acute findings. Electronically Signed   By: Narda Rutherford M.D.   On: 12/06/2023 16:56   DG  Tibia/Fibula Right Result Date: 12/06/2023 CLINICAL DATA:  Right leg injury. Fall from [employer]. Pain radiating to the foot. EXAM: RIGHT TIBIA AND FIBULA - 2 VIEW; RIGHT FOOT COMPLETE - 3+ VIEW; RIGHT ANKLE - COMPLETE 3+ VIEW COMPARISON:  None Available. FINDINGS: Tibia/fibula: Medial plate and screw fixation of the proximal tibia. Dense of hardware complication or tibial fracture. There is a minimally displaced and comminuted proximal fibular shaft fracture. This appears acute. Mild soft tissue edema. Ankle: No fracture or dislocation. The ankle mortise is preserved. No ankle joint effusion. No focal bone abnormality. Mild soft tissue edema laterally. Foot: No fracture or dislocation. The alignment and joint spaces are preserved. Minor degenerative spurring of the first metatarsal phalangeal joint there is mild dorsal soft tissue edema. IMPRESSION: 1. Minimally displaced and comminuted proximal fibular shaft fracture. 2. Medial plate and screw fixation of the proximal tibia without hardware complication or acute tibial fracture. 3. No fracture or dislocation of the ankle or foot. Electronically Signed   By: Narda Rutherford M.D.   On: 12/06/2023 16:55   DG Ankle Complete Right Result Date: 12/06/2023 CLINICAL DATA:  Right leg injury. Fall from [employer]. Pain radiating to the foot. EXAM: RIGHT TIBIA AND FIBULA - 2 VIEW; RIGHT FOOT COMPLETE - 3+ VIEW; RIGHT ANKLE - COMPLETE 3+ VIEW COMPARISON:  None Available. FINDINGS: Tibia/fibula: Medial plate and screw fixation of the proximal tibia. Dense of hardware complication or tibial fracture. There is a minimally displaced and comminuted proximal fibular shaft fracture. This appears acute. Mild soft tissue edema. Ankle: No fracture or dislocation. The ankle mortise is preserved. No ankle joint effusion. No focal bone abnormality. Mild soft tissue edema laterally. Foot: No fracture or dislocation. The alignment and joint spaces are preserved. Minor degenerative spurring of the  first metatarsal phalangeal joint there is mild dorsal soft  tissue edema. IMPRESSION: 1. Minimally displaced and comminuted proximal fibular shaft fracture. 2. Medial plate and screw fixation of the proximal tibia without hardware complication or acute tibial fracture. 3. No fracture or dislocation of the ankle or foot. Electronically Signed   By: Narda Rutherford M.D.   On: 12/06/2023 16:55   DG Foot Complete Right Result Date: 12/06/2023 CLINICAL DATA:  Right leg injury. Fall from [employer]. Pain radiating to the foot. EXAM: RIGHT TIBIA AND FIBULA - 2 VIEW; RIGHT FOOT COMPLETE - 3+ VIEW; RIGHT ANKLE - COMPLETE 3+ VIEW COMPARISON:  None Available. FINDINGS: Tibia/fibula: Medial plate and screw fixation of the proximal tibia. Dense of hardware complication or tibial fracture. There is a minimally displaced and comminuted proximal fibular shaft fracture. This appears acute. Mild soft tissue edema. Ankle: No fracture or dislocation. The ankle mortise is preserved. No ankle joint effusion. No focal bone abnormality. Mild soft tissue edema laterally. Foot: No fracture or dislocation. The alignment and joint spaces are preserved. Minor degenerative spurring of the first metatarsal phalangeal joint there is mild dorsal soft tissue edema. IMPRESSION: 1. Minimally displaced and comminuted proximal fibular shaft fracture. 2. Medial plate and screw fixation of the proximal tibia without hardware complication or acute tibial fracture. 3. No fracture or dislocation of the ankle or foot. Electronically Signed   By: Narda Rutherford M.D.   On: 12/06/2023 16:55    Procedures Procedures    Medications Ordered in ED Medications  oxyCODONE-acetaminophen (PERCOCET/ROXICET) 5-325 MG per tablet 1 tablet (has no administration in time range)  predniSONE (DELTASONE) tablet 60 mg (has no administration in time range)  fentaNYL (SUBLIMAZE) injection 50 mcg (50 mcg Intravenous Given 12/06/23 1717)  albuterol (PROVENTIL) (2.5 MG/3ML)  0.083% nebulizer solution 5 mg (5 mg Nebulization Given 12/06/23 1655)  ipratropium (ATROVENT) nebulizer solution 0.5 mg (0.5 mg Nebulization Given 12/06/23 1655)    ED Course/ Medical Decision Making/ A&P                                 Medical Decision Making HADEN CAVENAUGH is a 63 y.o. female here presenting with right leg injury.  Patient also has some shortness of breath as well.  Patient had previous surgery done and followed with [facility].  Plan to get right tib-fib and ankle and foot x-rays.  Will also get labs and chest x-ray and COVID test  7:08 PM I reviewed patient's labs and white blood cell count is 14.  Chest x-ray did not show any pneumonia.  Leg x-ray showed proximal fibula fracture.  I discussed case with Dr. Linna Caprice from ortho.  He states that the hardware in the tibia is in place and there is no periprosthetic fracture so patient can just follow-up outpatient.  She is already on chronic pain meds so I told her that I cannot prescribe more.  She can get a course of prednisone for COPD exacerbation  Problems Addressed: Chronic obstructive pulmonary disease with acute exacerbation [facility]): acute illness or injury Closed fracture of proximal end of right fibula, unspecified fracture morphology, initial encounter: acute illness or injury  Amount and/or Complexity of Data Reviewed Labs: ordered. Decision-making details documented in ED Course. Radiology: ordered and independent interpretation performed. Decision-making details documented in ED Course. ECG/medicine tests: ordered.  Risk Prescription drug management.    Final Clinical Impression(s) / ED Diagnoses Final diagnoses:  Closed fracture of proximal end of right fibula, unspecified fracture morphology,  initial encounter    Rx / DC Orders ED Discharge Orders     None         Charlynne Pander, MD 12/06/23 [place]

## 2023-12-16 ENCOUNTER — Other Ambulatory Visit (HOSPITAL_COMMUNITY): Payer: Self-pay

## 2023-12-16 ENCOUNTER — Encounter: Payer: Self-pay | Admitting: Hematology

## 2023-12-16 ENCOUNTER — Other Ambulatory Visit: Payer: Self-pay

## 2023-12-16 MED ORDER — OXYCODONE-ACETAMINOPHEN 10-325 MG PO TABS
1.0000 | ORAL_TABLET | Freq: Every day | ORAL | 0 refills | Status: DC
  Filled 2023-12-16: qty 180, 30d supply, fill #0

## 2023-12-16 MED ORDER — TIZANIDINE HCL 4 MG PO TABS
4.0000 mg | ORAL_TABLET | Freq: Four times a day (QID) | ORAL | 0 refills | Status: DC | PRN
  Filled 2023-12-16: qty 120, 30d supply, fill #0

## 2023-12-16 MED ORDER — VITAMIN D (ERGOCALCIFEROL) 1.25 MG (50000 UNIT) PO CAPS
50000.0000 [IU] | ORAL_CAPSULE | ORAL | 1 refills | Status: AC
  Filled 2023-12-16: qty 12, 84d supply, fill #0

## 2024-01-02 ENCOUNTER — Telehealth: Payer: Self-pay

## 2024-01-02 NOTE — Telephone Encounter (Signed)
 Copied from CRM [id number]. Topic: Clinical - Request for Lab/Test Order >> Jan 02, 2024 12:45 PM Renie Ora wrote: Reason for CRM: Ricky Ala, patient's respiratory therapist called regarding a prescription for a ventilator for the patient. Angelica Chessman stated she has faxed the rx in October and February to [phone]. Angelica Chessman stated it needs to be signed and changed and it can be done by anyone in office. The fax number it can be sent to is [phone] and attn: Mandy.  Tried to contact [facility] back to discuss patient's script for a ventilator.Mandy left no call back number

## 2024-01-13 ENCOUNTER — Other Ambulatory Visit (HOSPITAL_COMMUNITY): Payer: Self-pay

## 2024-01-13 MED ORDER — TIZANIDINE HCL 4 MG PO TABS
4.0000 mg | ORAL_TABLET | Freq: Four times a day (QID) | ORAL | 0 refills | Status: DC | PRN
Start: 2024-01-13 — End: 2024-02-12
  Filled 2024-01-13: qty 120, 30d supply, fill #0

## 2024-01-13 MED ORDER — OXYCODONE-ACETAMINOPHEN 10-325 MG PO TABS
1.0000 | ORAL_TABLET | Freq: Every day | ORAL | 0 refills | Status: DC
  Filled 2024-01-14: qty 180, 30d supply, fill #0

## 2024-01-14 ENCOUNTER — Other Ambulatory Visit (HOSPITAL_COMMUNITY): Payer: Self-pay

## 2024-01-14 MED ORDER — AZITHROMYCIN 250 MG PO TABS
ORAL_TABLET | ORAL | 0 refills | Status: AC
  Filled 2024-01-14 (×2): qty 6, 5d supply, fill #0

## 2024-01-20 ENCOUNTER — Other Ambulatory Visit: Payer: Self-pay | Admitting: Adult Health

## 2024-02-12 ENCOUNTER — Other Ambulatory Visit (HOSPITAL_COMMUNITY): Payer: Self-pay

## 2024-02-12 MED ORDER — TIZANIDINE HCL 4 MG PO TABS
4.0000 mg | ORAL_TABLET | Freq: Four times a day (QID) | ORAL | 0 refills | Status: AC | PRN
  Filled 2024-02-12: qty 120, 30d supply, fill #0

## 2024-02-12 MED ORDER — OXYCODONE-ACETAMINOPHEN 10-325 MG PO TABS
1.0000 | ORAL_TABLET | Freq: Every day | ORAL | 0 refills | Status: DC
  Filled 2024-02-12: qty 42, 7d supply, fill #0

## 2024-02-19 ENCOUNTER — Other Ambulatory Visit (HOSPITAL_COMMUNITY): Payer: Self-pay

## 2024-02-19 MED ORDER — TIZANIDINE HCL 4 MG PO TABS
4.0000 mg | ORAL_TABLET | Freq: Four times a day (QID) | ORAL | 0 refills | Status: AC
  Filled 2024-02-19 – 2024-11-10 (×2): qty 120, 30d supply, fill #0

## 2024-02-19 MED ORDER — VITAMIN D (ERGOCALCIFEROL) 1.25 MG (50000 UNIT) PO CAPS
50000.0000 [IU] | ORAL_CAPSULE | ORAL | 1 refills | Status: AC
  Filled 2024-02-19 (×2): qty 12, 84d supply, fill #0

## 2024-02-19 MED ORDER — OXYCODONE-ACETAMINOPHEN 10-325 MG PO TABS
1.0000 | ORAL_TABLET | Freq: Every day | ORAL | 0 refills | Status: AC
  Filled 2024-02-19: qty 42, 7d supply, fill #0

## 2024-02-26 ENCOUNTER — Other Ambulatory Visit (HOSPITAL_COMMUNITY): Payer: Self-pay

## 2024-02-26 ENCOUNTER — Other Ambulatory Visit: Payer: Self-pay

## 2024-02-26 ENCOUNTER — Ambulatory Visit: Payer: MEDICAID | Admitting: Adult Health

## 2024-02-26 MED ORDER — OXYCODONE-ACETAMINOPHEN 10-325 MG PO TABS
1.0000 | ORAL_TABLET | Freq: Every day | ORAL | 0 refills | Status: DC
  Filled 2024-02-26: qty 42, 7d supply, fill #0

## 2024-03-05 ENCOUNTER — Other Ambulatory Visit (HOSPITAL_COMMUNITY): Payer: Self-pay

## 2024-03-05 MED ORDER — OXYCODONE-ACETAMINOPHEN 10-325 MG PO TABS
1.0000 | ORAL_TABLET | Freq: Every day | ORAL | 0 refills | Status: DC
  Filled 2024-03-05: qty 42, 7d supply, fill #0

## 2024-03-05 MED ORDER — TIZANIDINE HCL 4 MG PO TABS
4.0000 mg | ORAL_TABLET | Freq: Four times a day (QID) | ORAL | 0 refills | Status: AC | PRN
  Filled 2024-03-05: qty 120, 30d supply, fill #0

## 2024-03-11 ENCOUNTER — Other Ambulatory Visit: Payer: Self-pay

## 2024-03-11 ENCOUNTER — Other Ambulatory Visit (HOSPITAL_COMMUNITY): Payer: Self-pay

## 2024-03-11 MED ORDER — TIZANIDINE HCL 4 MG PO TABS
4.0000 mg | ORAL_TABLET | Freq: Four times a day (QID) | ORAL | 0 refills | Status: AC | PRN
  Filled 2024-03-11 – 2024-10-14 (×3): qty 120, 30d supply, fill #0

## 2024-03-11 MED ORDER — OXYCODONE-ACETAMINOPHEN 10-325 MG PO TABS
1.0000 | ORAL_TABLET | ORAL | 0 refills | Status: DC | PRN
  Filled 2024-03-11 (×2): qty 180, 30d supply, fill #0

## 2024-03-11 MED ORDER — VITAMIN D (ERGOCALCIFEROL) 1.25 MG (50000 UNIT) PO CAPS
50000.0000 [IU] | ORAL_CAPSULE | ORAL | 1 refills | Status: AC
  Filled 2024-03-11: qty 12, 84d supply, fill #0

## 2024-04-14 ENCOUNTER — Other Ambulatory Visit (HOSPITAL_COMMUNITY): Payer: Self-pay

## 2024-04-14 ENCOUNTER — Encounter: Payer: Self-pay | Admitting: Hematology

## 2024-04-14 MED ORDER — OXYCODONE-ACETAMINOPHEN 10-325 MG PO TABS
1.0000 | ORAL_TABLET | ORAL | 0 refills | Status: AC | PRN
  Filled 2024-04-14: qty 180, 30d supply, fill #0

## 2024-04-14 MED ORDER — TIZANIDINE HCL 4 MG PO TABS
4.0000 mg | ORAL_TABLET | Freq: Four times a day (QID) | ORAL | 0 refills | Status: AC | PRN
  Filled 2024-04-14: qty 120, 30d supply, fill #0

## 2024-04-14 MED ORDER — VITAMIN D (ERGOCALCIFEROL) 1.25 MG (50000 UNIT) PO CAPS
ORAL_CAPSULE | ORAL | 1 refills | Status: AC
  Filled 2024-04-14: qty 12, 90d supply, fill #0

## 2024-04-21 ENCOUNTER — Other Ambulatory Visit (HOSPITAL_COMMUNITY): Payer: Self-pay

## 2024-04-21 MED ORDER — LOSARTAN POTASSIUM 25 MG PO TABS
25.0000 mg | ORAL_TABLET | Freq: Every day | ORAL | 1 refills | Status: AC
  Filled 2024-04-21: qty 90, 90d supply, fill #0
  Filled 2024-09-06 – 2024-09-10 (×3): qty 90, 90d supply, fill #1

## 2024-04-21 MED ORDER — ATORVASTATIN CALCIUM 40 MG PO TABS
40.0000 mg | ORAL_TABLET | Freq: Every day | ORAL | 1 refills | Status: AC
  Filled 2024-04-21: qty 90, 90d supply, fill #0

## 2024-05-04 ENCOUNTER — Other Ambulatory Visit: Payer: Self-pay

## 2024-05-09 ENCOUNTER — Other Ambulatory Visit: Payer: Self-pay | Admitting: Adult Health

## 2024-05-13 NOTE — Telephone Encounter (Signed)
 SABRA

## 2024-05-14 ENCOUNTER — Other Ambulatory Visit: Payer: Self-pay

## 2024-05-14 ENCOUNTER — Encounter: Payer: Self-pay | Admitting: Hematology

## 2024-05-14 ENCOUNTER — Other Ambulatory Visit (HOSPITAL_COMMUNITY): Payer: Self-pay

## 2024-05-14 MED ORDER — OXYCODONE-ACETAMINOPHEN 10-325 MG PO TABS
1.0000 | ORAL_TABLET | Freq: Every day | ORAL | 0 refills | Status: DC
  Filled 2024-05-14 – 2024-05-17 (×4): qty 180, 30d supply, fill #0

## 2024-05-14 MED ORDER — TIZANIDINE HCL 4 MG PO TABS
4.0000 mg | ORAL_TABLET | Freq: Four times a day (QID) | ORAL | 0 refills | Status: AC
  Filled 2024-05-14 – 2024-05-17 (×2): qty 120, 30d supply, fill #0

## 2024-05-17 ENCOUNTER — Other Ambulatory Visit (HOSPITAL_COMMUNITY): Payer: Self-pay

## 2024-05-17 ENCOUNTER — Encounter: Payer: Self-pay | Admitting: Hematology

## 2024-05-17 ENCOUNTER — Other Ambulatory Visit: Payer: Self-pay

## 2024-05-18 ENCOUNTER — Encounter: Payer: Self-pay | Admitting: Pulmonary Disease

## 2024-05-18 ENCOUNTER — Ambulatory Visit: Payer: MEDICAID | Admitting: Pulmonary Disease

## 2024-05-18 DIAGNOSIS — J449 Chronic obstructive pulmonary disease, unspecified: Secondary | ICD-10-CM | POA: Diagnosis not present

## 2024-05-18 LAB — PULMONARY FUNCTION TEST
DL/VA % pred: 63 %
DL/VA: 2.55 ml/min/mmHg/L
DLCO cor % pred: 32 %
DLCO cor: 7.73 ml/min/mmHg
DLCO unc % pred: 32 %
DLCO unc: 7.73 ml/min/mmHg
FEF 25-75 Post: 0.15 L/s
FEF 25-75 Pre: 0.2 L/s
FEF2575-%Change-Post: -22 %
FEF2575-%Pred-Post: 5 %
FEF2575-%Pred-Pre: 7 %
FEV1-%Change-Post: -13 %
FEV1-%Pred-Post: 14 %
FEV1-%Pred-Pre: 16 %
FEV1-Post: 0.45 L
FEV1-Pre: 0.51 L
FEV1FVC-%Change-Post: -5 %
FEV1FVC-%Pred-Pre: 53 %
FEV6-%Change-Post: -11 %
FEV6-%Pred-Post: 27 %
FEV6-%Pred-Pre: 31 %
FEV6-Post: 1.07 L
FEV6-Pre: 1.2 L
FEV6FVC-%Change-Post: -3 %
FEV6FVC-%Pred-Post: 96 %
FEV6FVC-%Pred-Pre: 100 %
FVC-%Change-Post: -8 %
FVC-%Pred-Post: 28 %
FVC-%Pred-Pre: 31 %
FVC-Post: 1.14 L
FVC-Pre: 1.25 L
Post FEV1/FVC ratio: 39 %
Post FEV6/FVC ratio: 93 %
Pre FEV1/FVC ratio: 41 %
Pre FEV6/FVC Ratio: 96 %

## 2024-05-18 NOTE — Progress Notes (Signed)
 Spirometry pre/post and diffusion capacity performed today. Patient unable to complete lung volumes.

## 2024-05-18 NOTE — Patient Instructions (Signed)
 Spirometry pre/post and diffusion capacity performed today. Patient unable to complete lung volumes.

## 2024-05-26 ENCOUNTER — Telehealth: Payer: Self-pay | Admitting: *Deleted

## 2024-05-26 NOTE — Telephone Encounter (Signed)
 I called and spoke with Brad ([facility]), he states that the patient has an NIV, not a Bipap.  Last report was done on 8/8, he will send me the report.  Nothing further needed.

## 2024-05-26 NOTE — Telephone Encounter (Signed)
 I called and spoke with patient, she states that she has court tomorrow and does not know if she will be out by 2:45 pm.  I attempted to reschedule her at [facility] as [facility] did not have any openings until September.  She said she did not know anything about that area and would just call and reschedule if she was not going to make it.  She stated she received a new Bipap machine recently.  Nothing further needed.

## 2024-05-27 ENCOUNTER — Telehealth: Payer: Self-pay | Admitting: *Deleted

## 2024-05-27 ENCOUNTER — Ambulatory Visit: Payer: MEDICAID | Admitting: Adult Health

## 2024-05-27 DIAGNOSIS — J449 Chronic obstructive pulmonary disease, unspecified: Secondary | ICD-10-CM

## 2024-05-27 DIAGNOSIS — J9611 Chronic respiratory failure with hypoxia: Secondary | ICD-10-CM

## 2024-05-27 NOTE — Telephone Encounter (Signed)
 Called and spoke with patient, she states she is at recess from court and does not think she will make it to her 3 pm appointment.  As I was attempting to reschedule her visit, she asked if she could call me back.  I advised her that she could call back to reschedule.  She is having increased sob, cough with brown mucous.  When she calls back, see if we can get her an acute visit with Dr. Neysa in one of his held slots.  Thank you.

## 2024-05-27 NOTE — Telephone Encounter (Signed)
 Patient has been rescheduled.NFN

## 2024-05-27 NOTE — Telephone Encounter (Signed)
 Called and spoke with patient, I offered patient a 10:30 am appointment on 8/22 since she was unable to make it to her appointment today.  She stated she was not a morning person and that was too early for her.  I spoke with [facility] and she said we could double book her one day next week.  I scheduled her for 8/26 at 2 pm.  Advised to arrive by 1:45 pm for check in.  She verbalized understanding.  Nothing further needed.

## 2024-05-27 NOTE — Telephone Encounter (Signed)
 PT called to reschedule apt. According to note left by RN pt was given the option to take a held slot for a acute visit. The only slot the PT was able to make is on 06/01/24 at 11:30. Routing to RN and clinical to confirm that it is ok to use. Please advise.

## 2024-05-28 ENCOUNTER — Ambulatory Visit: Payer: MEDICAID | Admitting: Adult Health

## 2024-06-01 ENCOUNTER — Ambulatory Visit: Payer: MEDICAID

## 2024-06-01 ENCOUNTER — Encounter: Payer: Self-pay | Admitting: Adult Health

## 2024-06-01 ENCOUNTER — Ambulatory Visit: Payer: MEDICAID | Admitting: Adult Health

## 2024-06-01 VITALS — BP 106/72 | HR 98 | Temp 98.0°F | Ht 69.0 in | Wt 212.0 lb

## 2024-06-01 DIAGNOSIS — Z87891 Personal history of nicotine dependence: Secondary | ICD-10-CM | POA: Diagnosis not present

## 2024-06-01 DIAGNOSIS — J449 Chronic obstructive pulmonary disease, unspecified: Secondary | ICD-10-CM

## 2024-06-01 DIAGNOSIS — J9612 Chronic respiratory failure with hypercapnia: Secondary | ICD-10-CM

## 2024-06-01 DIAGNOSIS — J9611 Chronic respiratory failure with hypoxia: Secondary | ICD-10-CM

## 2024-06-01 MED ORDER — TRELEGY ELLIPTA 200-62.5-25 MCG/ACT IN AEPB: 1.0000 | INHALATION_SPRAY | Freq: Every day | RESPIRATORY_TRACT | Status: AC

## 2024-06-01 MED ORDER — TRELEGY ELLIPTA 200-62.5-25 MCG/ACT IN AEPB: 1.0000 | INHALATION_SPRAY | Freq: Every day | RESPIRATORY_TRACT | 5 refills | Status: AC
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
## 2024-06-01 NOTE — Patient Instructions (Addendum)
 Continue on Trelegy 1 puff daily, rinse after use.  Albuterol  inhaler or neb As needed   Continue on Prednisone  5mg  daily.  Continue on Oxygen  2l/m, keep O2 sats >88-90%.  Continue on BIPAP At bedtime , wear all night long  BIPAP download  Activity as tolerated.  Chest xray today .  Flu and Covid vaccine this fall.  Follow up with Dr. Jude  or Tamecka Milham In 3 months and As needed

## 2024-06-11 ENCOUNTER — Other Ambulatory Visit (HOSPITAL_COMMUNITY): Payer: Self-pay

## 2024-06-11 MED ORDER — VITAMIN D (ERGOCALCIFEROL) 1.25 MG (50000 UNIT) PO CAPS
50000.0000 [IU] | ORAL_CAPSULE | ORAL | 1 refills | Status: AC
  Filled 2024-06-11: qty 12, 84d supply, fill #0

## 2024-06-11 MED ORDER — NALOXONE HCL 4 MG/0.1ML NA LIQD
1.0000 | NASAL | 3 refills | Status: AC
  Filled 2024-06-11: qty 2, 30d supply, fill #0

## 2024-06-11 MED ORDER — OXYCODONE-ACETAMINOPHEN 10-325 MG PO TABS
1.0000 | ORAL_TABLET | Freq: Every day | ORAL | 0 refills | Status: DC
  Filled 2024-06-14: qty 180, 30d supply, fill #0

## 2024-06-11 MED ORDER — TIZANIDINE HCL 4 MG PO TABS
4.0000 mg | ORAL_TABLET | Freq: Four times a day (QID) | ORAL | 0 refills | Status: AC | PRN
  Filled 2024-06-14: qty 120, 30d supply, fill #0

## 2024-06-14 ENCOUNTER — Other Ambulatory Visit (HOSPITAL_COMMUNITY): Payer: Self-pay

## 2024-06-15 ENCOUNTER — Encounter: Payer: Self-pay | Admitting: Adult Health

## 2024-06-15 ENCOUNTER — Ambulatory Visit (INDEPENDENT_AMBULATORY_CARE_PROVIDER_SITE_OTHER): Payer: MEDICAID

## 2024-06-15 DIAGNOSIS — J449 Chronic obstructive pulmonary disease, unspecified: Secondary | ICD-10-CM

## 2024-06-16 ENCOUNTER — Other Ambulatory Visit (HOSPITAL_COMMUNITY): Payer: Self-pay

## 2024-06-16 MED ORDER — BUDESONIDE 0.5 MG/2ML IN SUSP
2.0000 mL | Freq: Two times a day (BID) | RESPIRATORY_TRACT | 3 refills | Status: AC
  Filled 2024-06-16: qty 360, 90d supply, fill #0

## 2024-06-16 MED ORDER — ARFORMOTEROL TARTRATE 15 MCG/2ML IN NEBU
2.0000 mL | INHALATION_SOLUTION | Freq: Two times a day (BID) | RESPIRATORY_TRACT | 3 refills | Status: DC
  Filled 2024-06-16: qty 120, 30d supply, fill #0

## 2024-06-16 MED ORDER — PARI TREK S COMBO PACK DEVI: 0 refills | Status: AC

## 2024-06-17 ENCOUNTER — Ambulatory Visit: Payer: Self-pay | Admitting: Adult Health

## 2024-06-17 ENCOUNTER — Other Ambulatory Visit (HOSPITAL_COMMUNITY): Payer: Self-pay

## 2024-06-17 ENCOUNTER — Other Ambulatory Visit: Payer: Self-pay

## 2024-06-17 ENCOUNTER — Encounter: Payer: Self-pay | Admitting: Hematology

## 2024-06-21 ENCOUNTER — Telehealth: Payer: Self-pay

## 2024-06-21 ENCOUNTER — Other Ambulatory Visit (HOSPITAL_COMMUNITY): Payer: Self-pay

## 2024-06-21 NOTE — Telephone Encounter (Signed)
*  Pulm  Pharmacy Patient Advocate Encounter   Received notification from CoverMyMeds that prior authorization for Trelegy is required/requested.   Insurance verification completed.   The patient is insured through [employer] MEDICAID .   Per test claim: PA required; PA submitted to above mentioned insurance via Latent Key/confirmation #/EOC [id number] Status is pending

## 2024-06-22 NOTE — Telephone Encounter (Signed)
 Your request has been approved Approved. TRELEGY ELLIPTA  200/62.5/25/MCG/ACT Aero Pow [employer] Act is approved from 06/21/2024 to 06/21/2025. All strengths of the drug are approved. Authorization Expiration09/15/2026

## 2024-06-22 NOTE — Telephone Encounter (Signed)
ATC x1.  LVM to return call. 

## 2024-06-23 NOTE — Telephone Encounter (Signed)
 I called and spoke to pt. I tried to inform pt that her PA was approved but pt states she didn't want to worry about it and she did not want to return back to our office and she would like to be discharged. No reason was given.

## 2024-06-24 NOTE — Telephone Encounter (Signed)
 FYI

## 2024-06-28 ENCOUNTER — Other Ambulatory Visit (HOSPITAL_COMMUNITY): Payer: Self-pay

## 2024-07-05 ENCOUNTER — Other Ambulatory Visit (HOSPITAL_COMMUNITY): Payer: Self-pay

## 2024-07-09 ENCOUNTER — Other Ambulatory Visit (HOSPITAL_BASED_OUTPATIENT_CLINIC_OR_DEPARTMENT_OTHER): Payer: Self-pay

## 2024-07-09 ENCOUNTER — Other Ambulatory Visit (HOSPITAL_COMMUNITY): Payer: Self-pay

## 2024-07-09 ENCOUNTER — Other Ambulatory Visit: Payer: Self-pay

## 2024-07-09 MED ORDER — TIZANIDINE HCL 4 MG PO TABS
4.0000 mg | ORAL_TABLET | Freq: Four times a day (QID) | ORAL | 0 refills | Status: AC | PRN
  Filled 2024-07-09 – 2024-07-12 (×2): qty 120, 30d supply, fill #0

## 2024-07-09 MED ORDER — OXYCODONE-ACETAMINOPHEN 10-325 MG PO TABS
1.0000 | ORAL_TABLET | Freq: Every day | ORAL | 0 refills | Status: DC
  Filled 2024-07-09: qty 180, 30d supply, fill #0
  Filled 2024-07-12: qty 30, 5d supply, fill #0
  Filled 2024-07-12: qty 150, 25d supply, fill #0

## 2024-07-12 ENCOUNTER — Other Ambulatory Visit (HOSPITAL_COMMUNITY): Payer: Self-pay

## 2024-07-12 ENCOUNTER — Other Ambulatory Visit: Payer: Self-pay

## 2024-07-12 MED ORDER — ALBUTEROL SULFATE (2.5 MG/3ML) 0.083% IN NEBU
2.5000 mg | INHALATION_SOLUTION | Freq: Four times a day (QID) | RESPIRATORY_TRACT | 5 refills | Status: AC | PRN
  Filled 2024-07-12: qty 150, 13d supply, fill #0

## 2024-07-13 ENCOUNTER — Other Ambulatory Visit (HOSPITAL_COMMUNITY): Payer: Self-pay

## 2024-07-13 NOTE — Progress Notes (Signed)
 I called and spoke with patient, provided results.  She verbalized understanding.  Nothing further needed.

## 2024-07-14 ENCOUNTER — Other Ambulatory Visit (HOSPITAL_COMMUNITY): Payer: Self-pay

## 2024-07-21 ENCOUNTER — Other Ambulatory Visit (HOSPITAL_COMMUNITY): Payer: Self-pay

## 2024-08-03 ENCOUNTER — Other Ambulatory Visit: Payer: Self-pay

## 2024-08-10 ENCOUNTER — Other Ambulatory Visit (HOSPITAL_COMMUNITY): Payer: Self-pay

## 2024-08-10 ENCOUNTER — Other Ambulatory Visit: Payer: Self-pay

## 2024-08-10 MED ORDER — TIZANIDINE HCL 4 MG PO TABS
4.0000 mg | ORAL_TABLET | Freq: Four times a day (QID) | ORAL | 0 refills | Status: AC | PRN
  Filled 2024-08-10: qty 120, 30d supply, fill #0

## 2024-08-10 MED ORDER — OXYCODONE-ACETAMINOPHEN 10-325 MG PO TABS
ORAL_TABLET | ORAL | 0 refills | Status: DC
  Filled 2024-08-10: qty 180, 30d supply, fill #0

## 2024-08-10 MED ORDER — VITAMIN D (ERGOCALCIFEROL) 1.25 MG (50000 UNIT) PO CAPS
50000.0000 [IU] | ORAL_CAPSULE | ORAL | 1 refills | Status: AC
  Filled 2024-08-10: qty 12, 84d supply, fill #0

## 2024-08-11 ENCOUNTER — Other Ambulatory Visit: Payer: Self-pay

## 2024-08-11 ENCOUNTER — Other Ambulatory Visit (HOSPITAL_COMMUNITY): Payer: Self-pay

## 2024-08-12 ENCOUNTER — Other Ambulatory Visit: Payer: Self-pay

## 2024-08-12 ENCOUNTER — Other Ambulatory Visit (HOSPITAL_COMMUNITY): Payer: Self-pay

## 2024-08-30 ENCOUNTER — Other Ambulatory Visit (HOSPITAL_COMMUNITY): Payer: Self-pay

## 2024-08-30 MED ORDER — LOSARTAN POTASSIUM 25 MG PO TABS
25.0000 mg | ORAL_TABLET | Freq: Every day | ORAL | 1 refills | Status: AC
  Filled 2024-11-10: qty 90, 90d supply, fill #0

## 2024-08-30 MED ORDER — NITROFURANTOIN MONOHYD MACRO 100 MG PO CAPS
100.0000 mg | ORAL_CAPSULE | Freq: Two times a day (BID) | ORAL | 0 refills | Status: AC
  Filled 2024-08-30: qty 10, 5d supply, fill #0

## 2024-08-30 MED ORDER — PHENAZOPYRIDINE HCL 100 MG PO TABS
100.0000 mg | ORAL_TABLET | Freq: Three times a day (TID) | ORAL | 0 refills | Status: AC
  Filled 2024-08-30: qty 6, 2d supply, fill #0

## 2024-08-30 MED ORDER — ONDANSETRON 4 MG PO TBDP
4.0000 mg | ORAL_TABLET | Freq: Three times a day (TID) | ORAL | 0 refills | Status: AC | PRN
Start: 2024-08-30 — End: ?
  Filled 2024-08-30: qty 90, 30d supply, fill #0

## 2024-09-06 ENCOUNTER — Encounter: Payer: Self-pay | Admitting: Hematology

## 2024-09-06 ENCOUNTER — Ambulatory Visit: Payer: MEDICAID | Admitting: Adult Health

## 2024-09-06 ENCOUNTER — Other Ambulatory Visit (HOSPITAL_COMMUNITY): Payer: Self-pay

## 2024-09-07 ENCOUNTER — Other Ambulatory Visit (HOSPITAL_COMMUNITY): Payer: Self-pay

## 2024-09-07 MED ORDER — OXYCODONE-ACETAMINOPHEN 10-325 MG PO TABS
1.0000 | ORAL_TABLET | Freq: Every day | ORAL | 0 refills | Status: DC
  Filled 2024-09-08 (×5): qty 180, 30d supply, fill #0

## 2024-09-07 MED ORDER — TIZANIDINE HCL 4 MG PO TABS
4.0000 mg | ORAL_TABLET | Freq: Four times a day (QID) | ORAL | 0 refills | Status: AC | PRN
  Filled 2024-09-07: qty 120, 30d supply, fill #0

## 2024-09-08 ENCOUNTER — Other Ambulatory Visit (HOSPITAL_COMMUNITY): Payer: Self-pay

## 2024-09-09 ENCOUNTER — Other Ambulatory Visit (HOSPITAL_COMMUNITY): Payer: Self-pay

## 2024-09-13 ENCOUNTER — Other Ambulatory Visit (HOSPITAL_COMMUNITY): Payer: Self-pay

## 2024-10-04 ENCOUNTER — Other Ambulatory Visit: Payer: Self-pay | Admitting: Pulmonary Disease

## 2024-10-14 ENCOUNTER — Other Ambulatory Visit: Payer: Self-pay

## 2024-10-14 ENCOUNTER — Other Ambulatory Visit (HOSPITAL_COMMUNITY): Payer: Self-pay

## 2024-10-14 MED ORDER — OXYCODONE-ACETAMINOPHEN 10-325 MG PO TABS
1.0000 | ORAL_TABLET | Freq: Every day | ORAL | 0 refills | Status: AC
  Filled 2024-10-14: qty 180, 30d supply, fill #0

## 2024-10-25 ENCOUNTER — Other Ambulatory Visit: Payer: Self-pay

## 2024-10-29 ENCOUNTER — Other Ambulatory Visit (HOSPITAL_COMMUNITY): Payer: Self-pay

## 2024-10-29 MED ORDER — IPRATROPIUM-ALBUTEROL 0.5-2.5 (3) MG/3ML IN SOLN
3.0000 mL | Freq: Four times a day (QID) | RESPIRATORY_TRACT | 3 refills | Status: AC | PRN
  Filled 2024-10-29: qty 360, 30d supply, fill #0

## 2024-11-07 ENCOUNTER — Other Ambulatory Visit: Payer: Self-pay

## 2024-11-08 ENCOUNTER — Emergency Department (HOSPITAL_COMMUNITY): Payer: MEDICAID

## 2024-11-08 ENCOUNTER — Other Ambulatory Visit: Payer: Self-pay

## 2024-11-08 ENCOUNTER — Encounter (HOSPITAL_COMMUNITY): Payer: Self-pay

## 2024-11-08 ENCOUNTER — Emergency Department (HOSPITAL_COMMUNITY)
Admission: EM | Admit: 2024-11-08 | Discharge: 2024-11-08 | Disposition: A | Discharge status: Home / Self Care | Payer: MEDICAID | Attending: Emergency Medicine | Admitting: Emergency Medicine

## 2024-11-08 DIAGNOSIS — J441 Chronic obstructive pulmonary disease with (acute) exacerbation: Secondary | ICD-10-CM | POA: Insufficient documentation

## 2024-11-08 DIAGNOSIS — I10 Essential (primary) hypertension: Secondary | ICD-10-CM | POA: Insufficient documentation

## 2024-11-08 DIAGNOSIS — Z79899 Other long term (current) drug therapy: Secondary | ICD-10-CM | POA: Insufficient documentation

## 2024-11-08 DIAGNOSIS — E039 Hypothyroidism, unspecified: Secondary | ICD-10-CM | POA: Insufficient documentation

## 2024-11-08 DIAGNOSIS — R739 Hyperglycemia, unspecified: Secondary | ICD-10-CM | POA: Insufficient documentation

## 2024-11-08 DIAGNOSIS — W0110XA Fall on same level from slipping, tripping and stumbling with subsequent striking against unspecified object, initial encounter: Secondary | ICD-10-CM | POA: Insufficient documentation

## 2024-11-08 DIAGNOSIS — W19XXXA Unspecified fall, initial encounter: Secondary | ICD-10-CM

## 2024-11-08 LAB — CBC WITH DIFFERENTIAL/PLATELET
Abs Immature Granulocytes: 0.04 10*3/uL (ref 0.00–0.07)
Basophils Absolute: 0.1 10*3/uL (ref 0.0–0.1)
Basophils Relative: 1 %
Eosinophils Absolute: 0.1 10*3/uL (ref 0.0–0.5)
Eosinophils Relative: 1 %
HCT: 45.5 % (ref 36.0–46.0)
Hemoglobin: 13 g/dL (ref 12.0–15.0)
Immature Granulocytes: 0 %
Lymphocytes Relative: 16 %
Lymphs Abs: 1.7 10*3/uL (ref 0.7–4.0)
MCH: 25.8 pg — ABNORMAL LOW (ref 26.0–34.0)
MCHC: 28.6 g/dL — ABNORMAL LOW (ref 30.0–36.0)
MCV: 90.5 fL (ref 80.0–100.0)
Monocytes Absolute: 0.9 10*3/uL (ref 0.1–1.0)
Monocytes Relative: 8 %
Neutro Abs: 7.7 10*3/uL (ref 1.7–7.7)
Neutrophils Relative %: 74 %
Platelets: 282 10*3/uL (ref 150–400)
RBC: 5.03 MIL/uL (ref 3.87–5.11)
RDW: 15.2 % (ref 11.5–15.5)
WBC: 10.5 10*3/uL (ref 4.0–10.5)
nRBC: 0 % (ref 0.0–0.2)

## 2024-11-08 LAB — BASIC METABOLIC PANEL WITH GFR
Anion gap: 11 (ref 5–15)
BUN: 13 mg/dL (ref 8–23)
CO2: 25 mmol/L (ref 22–32)
Calcium: 9.3 mg/dL (ref 8.9–10.3)
Chloride: 98 mmol/L (ref 98–111)
Creatinine, Ser: 0.61 mg/dL (ref 0.44–1.00)
GFR, Estimated: >60 mL/min
Glucose, Bld: 91 mg/dL (ref 70–99)
Potassium: 5.3 mmol/L — ABNORMAL HIGH (ref 3.5–5.1)
Sodium: 135 mmol/L (ref 135–145)

## 2024-11-08 LAB — HEPATIC FUNCTION PANEL
ALT: 12 U/L (ref 0–44)
AST: 30 U/L (ref 15–41)
Albumin: 3.6 g/dL (ref 3.5–5.0)
Alkaline Phosphatase: 88 U/L (ref 38–126)
Bilirubin, Direct: <0.1 mg/dL (ref 0.0–0.2)
Total Bilirubin: 0.6 mg/dL (ref 0.0–1.2)
Total Protein: 7.9 g/dL (ref 6.5–8.1)

## 2024-11-08 LAB — LIPASE, BLOOD: Lipase: 15 U/L (ref 11–51)

## 2024-11-08 MED ORDER — KETOROLAC TROMETHAMINE 15 MG/ML IJ SOLN
15.0000 mg | Freq: Once | INTRAMUSCULAR | Status: AC
  Administered 2024-11-08: 15 mg via INTRAMUSCULAR
  Filled 2024-11-08: qty 1

## 2024-11-08 MED ORDER — PREDNISONE 20 MG PO TABS
60.0000 mg | ORAL_TABLET | Freq: Once | ORAL | Status: AC
  Administered 2024-11-08: 60 mg via ORAL
  Filled 2024-11-08: qty 3

## 2024-11-08 MED ORDER — PREDNISONE 10 MG PO TABS: 40.0000 mg | ORAL_TABLET | Freq: Every day | ORAL | 0 refills | Status: AC

## 2024-11-08 MED ORDER — IPRATROPIUM-ALBUTEROL 0.5-2.5 (3) MG/3ML IN SOLN
3.0000 mL | Freq: Once | RESPIRATORY_TRACT | Status: AC
  Administered 2024-11-08: 3 mL via RESPIRATORY_TRACT
  Filled 2024-11-08: qty 3

## 2024-11-08 MED ORDER — NAPROXEN 500 MG PO TABS: 500.0000 mg | ORAL_TABLET | Freq: Two times a day (BID) | ORAL | 0 refills | Status: AC

## 2024-11-08 MED ORDER — IOHEXOL 350 MG/ML SOLN
75.0000 mL | Freq: Once | INTRAVENOUS | Status: AC | PRN
  Administered 2024-11-08: 75 mL via INTRAVENOUS

## 2024-11-08 MED ORDER — OXYCODONE-ACETAMINOPHEN 5-325 MG PO TABS
1.0000 | ORAL_TABLET | Freq: Once | ORAL | Status: AC
  Administered 2024-11-08: 1 via ORAL
  Filled 2024-11-08: qty 1

## 2024-11-08 MED ORDER — AMOXICILLIN-POT CLAVULANATE 875-125 MG PO TABS
1.0000 | ORAL_TABLET | Freq: Once | ORAL | Status: AC
  Administered 2024-11-08: 1 via ORAL
  Filled 2024-11-08: qty 1

## 2024-11-08 MED ORDER — AZITHROMYCIN 250 MG PO TABS: 250.0000 mg | ORAL_TABLET | Freq: Every day | ORAL | 0 refills | Status: AC

## 2024-11-08 NOTE — Discharge Instructions (Signed)
 Thank you for letting us  take care of you today.  You came in today for evaluation of rib pain after a fall a few days ago.  We did CT scans that were reassuring that did not show any evidence of pneumonia or evidence of broken ribs.  We think you may have had a bruised rib.  We will send you home with medication to use.  The naproxen  is to use as needed for pain.  The steroids as well as the antibiotic she should take due to concerns for a COPD exacerbation.  Please come back to the emergency department if you have persistent or worsening symptoms.

## 2024-11-08 NOTE — ED Notes (Signed)
Patient given something to eat and drink

## 2024-11-10 ENCOUNTER — Encounter: Payer: Self-pay | Admitting: Hematology

## 2024-11-10 ENCOUNTER — Other Ambulatory Visit (HOSPITAL_COMMUNITY): Payer: Self-pay
